# Patient Record
Sex: Female | Born: 1935
Health system: Southern US, Community
[De-identification: ages and names within clinical notes are randomized; demographics above are authoritative.]

## PROBLEM LIST (undated history)

## (undated) DIAGNOSIS — D649 Anemia, unspecified: Secondary | ICD-10-CM

## (undated) DIAGNOSIS — I509 Heart failure, unspecified: Secondary | ICD-10-CM

## (undated) DIAGNOSIS — IMO0002 Reserved for concepts with insufficient information to code with codable children: Secondary | ICD-10-CM

## (undated) DIAGNOSIS — K648 Other hemorrhoids: Secondary | ICD-10-CM

## (undated) DIAGNOSIS — M8580 Other specified disorders of bone density and structure, unspecified site: Secondary | ICD-10-CM

## (undated) DIAGNOSIS — I10 Essential (primary) hypertension: Secondary | ICD-10-CM

## (undated) DIAGNOSIS — K635 Polyp of colon: Secondary | ICD-10-CM

## (undated) DIAGNOSIS — I4901 Ventricular fibrillation: Secondary | ICD-10-CM

## (undated) DIAGNOSIS — D729 Disorder of white blood cells, unspecified: Secondary | ICD-10-CM

## (undated) DIAGNOSIS — I428 Other cardiomyopathies: Secondary | ICD-10-CM

## (undated) DIAGNOSIS — K5730 Diverticulosis of large intestine without perforation or abscess without bleeding: Secondary | ICD-10-CM

## (undated) DIAGNOSIS — Z95810 Presence of automatic (implantable) cardiac defibrillator: Secondary | ICD-10-CM

## (undated) DIAGNOSIS — K644 Residual hemorrhoidal skin tags: Secondary | ICD-10-CM

## (undated) HISTORY — DX: Essential (primary) hypertension: I10

## (undated) HISTORY — PX: CARDIAC CATHETERIZATION: SHX172

## (undated) HISTORY — DX: Other cardiomyopathies: I42.8

## (undated) HISTORY — DX: Ventricular fibrillation: I49.01

## (undated) HISTORY — DX: Residual hemorrhoidal skin tags: K64.4

## (undated) HISTORY — DX: Other hemorrhoids: K64.8

## (undated) HISTORY — DX: Anemia, unspecified: D64.9

## (undated) HISTORY — DX: Polyp of colon: K63.5

## (undated) HISTORY — DX: Reserved for concepts with insufficient information to code with codable children: IMO0002

## (undated) HISTORY — DX: Presence of automatic (implantable) cardiac defibrillator: Z95.810

## (undated) HISTORY — DX: Diverticulosis of large intestine without perforation or abscess without bleeding: K57.30

## (undated) HISTORY — PX: ABDOMINAL HYSTERECTOMY: SHX81

## (undated) HISTORY — DX: Other specified disorders of bone density and structure, unspecified site: M85.80

## (undated) HISTORY — DX: Heart failure, unspecified: I50.9

## (undated) HISTORY — DX: Disorder of white blood cells, unspecified: D72.9

## (undated) HISTORY — PX: CARDIAC DEFIBRILLATOR PLACEMENT: SHX171

---

## 1998-01-06 ENCOUNTER — Ambulatory Visit (HOSPITAL_COMMUNITY): Admission: RE | Admit: 1998-01-06 | Discharge: 1998-01-06 | Payer: Self-pay | Admitting: Cardiology

## 1998-02-10 ENCOUNTER — Ambulatory Visit (HOSPITAL_COMMUNITY): Admission: RE | Admit: 1998-02-10 | Discharge: 1998-02-10 | Payer: Self-pay | Admitting: Cardiology

## 1998-02-24 ENCOUNTER — Ambulatory Visit (HOSPITAL_COMMUNITY): Admission: RE | Admit: 1998-02-24 | Discharge: 1998-02-24 | Payer: Self-pay | Admitting: Cardiology

## 2002-07-19 ENCOUNTER — Other Ambulatory Visit: Admission: RE | Admit: 2002-07-19 | Discharge: 2002-07-19 | Payer: Self-pay | Admitting: Family Medicine

## 2002-07-19 ENCOUNTER — Encounter (INDEPENDENT_AMBULATORY_CARE_PROVIDER_SITE_OTHER): Payer: Self-pay | Admitting: Family Medicine

## 2002-07-19 LAB — CONVERTED CEMR LAB: Pap Smear: NORMAL

## 2003-03-04 DIAGNOSIS — Z8679 Personal history of other diseases of the circulatory system: Secondary | ICD-10-CM | POA: Insufficient documentation

## 2005-06-20 ENCOUNTER — Ambulatory Visit: Payer: Self-pay | Admitting: Family Medicine

## 2006-07-02 ENCOUNTER — Ambulatory Visit: Payer: Self-pay | Admitting: Family Medicine

## 2006-07-15 ENCOUNTER — Ambulatory Visit: Payer: Self-pay | Admitting: Family Medicine

## 2006-07-17 ENCOUNTER — Encounter (INDEPENDENT_AMBULATORY_CARE_PROVIDER_SITE_OTHER): Payer: Self-pay | Admitting: Family Medicine

## 2006-07-17 ENCOUNTER — Ambulatory Visit (HOSPITAL_COMMUNITY): Admission: RE | Admit: 2006-07-17 | Discharge: 2006-07-17 | Payer: Self-pay | Admitting: Family Medicine

## 2006-08-05 ENCOUNTER — Ambulatory Visit: Payer: Self-pay | Admitting: Family Medicine

## 2006-08-21 ENCOUNTER — Encounter (INDEPENDENT_AMBULATORY_CARE_PROVIDER_SITE_OTHER): Payer: Self-pay | Admitting: Family Medicine

## 2006-09-11 ENCOUNTER — Ambulatory Visit: Payer: Self-pay | Admitting: Family Medicine

## 2006-09-17 ENCOUNTER — Ambulatory Visit: Payer: Self-pay | Admitting: Family Medicine

## 2006-10-06 ENCOUNTER — Encounter (INDEPENDENT_AMBULATORY_CARE_PROVIDER_SITE_OTHER): Payer: Self-pay | Admitting: Family Medicine

## 2007-05-13 ENCOUNTER — Encounter (INDEPENDENT_AMBULATORY_CARE_PROVIDER_SITE_OTHER): Payer: Self-pay | Admitting: Family Medicine

## 2007-05-13 DIAGNOSIS — I509 Heart failure, unspecified: Secondary | ICD-10-CM | POA: Insufficient documentation

## 2007-05-13 DIAGNOSIS — M949 Disorder of cartilage, unspecified: Secondary | ICD-10-CM | POA: Insufficient documentation

## 2007-05-13 DIAGNOSIS — I10 Essential (primary) hypertension: Secondary | ICD-10-CM | POA: Insufficient documentation

## 2007-05-13 DIAGNOSIS — M899 Disorder of bone, unspecified: Secondary | ICD-10-CM | POA: Insufficient documentation

## 2007-05-13 DIAGNOSIS — I428 Other cardiomyopathies: Secondary | ICD-10-CM | POA: Insufficient documentation

## 2007-07-02 ENCOUNTER — Ambulatory Visit: Payer: Self-pay | Admitting: Family Medicine

## 2007-07-03 ENCOUNTER — Encounter (INDEPENDENT_AMBULATORY_CARE_PROVIDER_SITE_OTHER): Payer: Self-pay | Admitting: Family Medicine

## 2007-08-25 ENCOUNTER — Encounter (INDEPENDENT_AMBULATORY_CARE_PROVIDER_SITE_OTHER): Payer: Self-pay | Admitting: Family Medicine

## 2007-09-03 ENCOUNTER — Encounter: Payer: Self-pay | Admitting: Internal Medicine

## 2007-09-03 LAB — CONVERTED CEMR LAB: Pap Smear: NORMAL

## 2007-10-07 ENCOUNTER — Encounter (INDEPENDENT_AMBULATORY_CARE_PROVIDER_SITE_OTHER): Payer: Self-pay | Admitting: Family Medicine

## 2008-08-30 ENCOUNTER — Encounter (INDEPENDENT_AMBULATORY_CARE_PROVIDER_SITE_OTHER): Payer: Self-pay | Admitting: Family Medicine

## 2008-10-03 ENCOUNTER — Encounter (INDEPENDENT_AMBULATORY_CARE_PROVIDER_SITE_OTHER): Payer: Self-pay | Admitting: Family Medicine

## 2008-10-04 ENCOUNTER — Encounter (INDEPENDENT_AMBULATORY_CARE_PROVIDER_SITE_OTHER): Payer: Self-pay | Admitting: Family Medicine

## 2008-10-06 ENCOUNTER — Encounter (INDEPENDENT_AMBULATORY_CARE_PROVIDER_SITE_OTHER): Payer: Self-pay | Admitting: Family Medicine

## 2008-12-06 ENCOUNTER — Encounter: Payer: Self-pay | Admitting: Physician Assistant

## 2009-04-03 ENCOUNTER — Encounter (INDEPENDENT_AMBULATORY_CARE_PROVIDER_SITE_OTHER): Payer: Self-pay | Admitting: Internal Medicine

## 2009-08-02 DIAGNOSIS — I4901 Ventricular fibrillation: Secondary | ICD-10-CM

## 2009-08-02 HISTORY — DX: Ventricular fibrillation: I49.01

## 2009-08-27 ENCOUNTER — Inpatient Hospital Stay (HOSPITAL_COMMUNITY): Admission: EM | Admit: 2009-08-27 | Discharge: 2009-09-07 | Payer: Self-pay | Admitting: Emergency Medicine

## 2009-08-27 ENCOUNTER — Ambulatory Visit: Payer: Self-pay | Admitting: Internal Medicine

## 2009-08-27 ENCOUNTER — Ambulatory Visit: Payer: Self-pay | Admitting: Cardiology

## 2009-08-27 ENCOUNTER — Encounter: Payer: Self-pay | Admitting: Cardiology

## 2009-08-31 ENCOUNTER — Encounter: Payer: Self-pay | Admitting: Internal Medicine

## 2009-09-02 DIAGNOSIS — K5730 Diverticulosis of large intestine without perforation or abscess without bleeding: Secondary | ICD-10-CM

## 2009-09-02 DIAGNOSIS — K644 Residual hemorrhoidal skin tags: Secondary | ICD-10-CM

## 2009-09-02 DIAGNOSIS — K635 Polyp of colon: Secondary | ICD-10-CM

## 2009-09-02 HISTORY — DX: Diverticulosis of large intestine without perforation or abscess without bleeding: K57.30

## 2009-09-02 HISTORY — DX: Residual hemorrhoidal skin tags: K64.4

## 2009-09-02 HISTORY — PX: COLONOSCOPY W/ POLYPECTOMY: SHX1380

## 2009-09-02 HISTORY — DX: Polyp of colon: K63.5

## 2009-09-06 ENCOUNTER — Encounter: Payer: Self-pay | Admitting: Internal Medicine

## 2009-09-06 ENCOUNTER — Encounter: Payer: Self-pay | Admitting: Cardiology

## 2009-09-07 ENCOUNTER — Encounter: Payer: Self-pay | Admitting: Internal Medicine

## 2009-09-09 ENCOUNTER — Ambulatory Visit: Payer: Self-pay | Admitting: Cardiology

## 2009-09-11 ENCOUNTER — Encounter: Payer: Self-pay | Admitting: Internal Medicine

## 2009-09-11 ENCOUNTER — Encounter (INDEPENDENT_AMBULATORY_CARE_PROVIDER_SITE_OTHER): Payer: Self-pay | Admitting: Cardiology

## 2009-09-11 LAB — CONVERTED CEMR LAB
POC INR: 1.2
Prothrombin Time: 12.4 s

## 2009-09-13 ENCOUNTER — Encounter: Payer: Self-pay | Admitting: Internal Medicine

## 2009-09-14 ENCOUNTER — Encounter (INDEPENDENT_AMBULATORY_CARE_PROVIDER_SITE_OTHER): Payer: Self-pay | Admitting: Cardiology

## 2009-09-14 ENCOUNTER — Encounter: Payer: Self-pay | Admitting: Internal Medicine

## 2009-09-14 LAB — CONVERTED CEMR LAB
POC INR: 1.4
Prothrombin Time: 14 s

## 2009-09-19 ENCOUNTER — Encounter: Payer: Self-pay | Admitting: Cardiology

## 2009-09-19 LAB — CONVERTED CEMR LAB
POC INR: 1.7
Prothrombin Time: 17.3 s

## 2009-09-21 ENCOUNTER — Encounter: Payer: Self-pay | Admitting: Internal Medicine

## 2009-09-21 ENCOUNTER — Ambulatory Visit: Payer: Self-pay

## 2009-09-22 ENCOUNTER — Ambulatory Visit: Payer: Self-pay | Admitting: Internal Medicine

## 2009-09-22 DIAGNOSIS — I4901 Ventricular fibrillation: Secondary | ICD-10-CM | POA: Insufficient documentation

## 2009-09-25 ENCOUNTER — Encounter: Payer: Self-pay | Admitting: Internal Medicine

## 2009-09-25 ENCOUNTER — Encounter: Payer: Self-pay | Admitting: Cardiology

## 2009-09-25 LAB — CONVERTED CEMR LAB: POC INR: 2.9

## 2009-09-26 LAB — CONVERTED CEMR LAB
BUN: 8 mg/dL (ref 6–23)
CO2: 27 meq/L (ref 19–32)
Calcium: 8.9 mg/dL (ref 8.4–10.5)
Chloride: 108 meq/L (ref 96–112)
Creatinine, Ser: 0.8 mg/dL (ref 0.4–1.2)
GFR calc non Af Amer: 90.48 mL/min (ref >60)
Glucose, Bld: 107 mg/dL — ABNORMAL HIGH (ref 70–99)
Magnesium: 2 mg/dL (ref 1.5–2.5)
Potassium: 3.2 meq/L — ABNORMAL LOW (ref 3.5–5.1)
Sodium: 141 meq/L (ref 135–145)

## 2009-09-28 ENCOUNTER — Encounter: Payer: Self-pay | Admitting: Cardiology

## 2009-10-02 ENCOUNTER — Encounter: Payer: Self-pay | Admitting: Physician Assistant

## 2009-10-03 ENCOUNTER — Encounter: Payer: Self-pay | Admitting: Internal Medicine

## 2009-10-03 LAB — CONVERTED CEMR LAB
POC INR: 2.1
Prothrombin Time: 20.7 s

## 2009-10-04 ENCOUNTER — Encounter: Payer: Self-pay | Admitting: Physician Assistant

## 2009-10-11 ENCOUNTER — Encounter: Payer: Self-pay | Admitting: Cardiology

## 2009-10-11 ENCOUNTER — Encounter: Payer: Self-pay | Admitting: Internal Medicine

## 2009-10-11 LAB — CONVERTED CEMR LAB
POC INR: 2.4
Prothrombin Time: 23.9 s

## 2009-10-13 ENCOUNTER — Telehealth: Payer: Self-pay | Admitting: Physician Assistant

## 2009-10-26 ENCOUNTER — Ambulatory Visit: Payer: Self-pay | Admitting: Cardiology

## 2009-10-26 LAB — CONVERTED CEMR LAB: POC INR: 1.9

## 2009-11-06 ENCOUNTER — Encounter: Payer: Self-pay | Admitting: Physician Assistant

## 2009-11-07 ENCOUNTER — Encounter: Payer: Self-pay | Admitting: Cardiology

## 2009-11-08 ENCOUNTER — Telehealth: Payer: Self-pay | Admitting: Physician Assistant

## 2009-11-08 ENCOUNTER — Encounter: Payer: Self-pay | Admitting: Physician Assistant

## 2009-11-10 ENCOUNTER — Ambulatory Visit: Payer: Self-pay | Admitting: Internal Medicine

## 2009-11-10 DIAGNOSIS — R059 Cough, unspecified: Secondary | ICD-10-CM | POA: Insufficient documentation

## 2009-11-10 DIAGNOSIS — R05 Cough: Secondary | ICD-10-CM

## 2009-11-16 ENCOUNTER — Ambulatory Visit: Payer: Self-pay | Admitting: Internal Medicine

## 2009-11-16 LAB — CONVERTED CEMR LAB: POC INR: 2.3

## 2009-11-29 ENCOUNTER — Encounter: Payer: Self-pay | Admitting: Internal Medicine

## 2009-12-14 ENCOUNTER — Ambulatory Visit: Payer: Self-pay | Admitting: Internal Medicine

## 2009-12-14 LAB — CONVERTED CEMR LAB: POC INR: 2

## 2009-12-25 ENCOUNTER — Encounter: Payer: Self-pay | Admitting: Internal Medicine

## 2010-01-11 ENCOUNTER — Ambulatory Visit: Payer: Self-pay | Admitting: Internal Medicine

## 2010-01-11 LAB — CONVERTED CEMR LAB: POC INR: 1.1

## 2010-01-12 ENCOUNTER — Ambulatory Visit (HOSPITAL_COMMUNITY): Admission: RE | Admit: 2010-01-12 | Discharge: 2010-01-12 | Payer: Self-pay | Admitting: Gastroenterology

## 2010-01-19 ENCOUNTER — Ambulatory Visit: Payer: Self-pay | Admitting: Internal Medicine

## 2010-01-19 LAB — CONVERTED CEMR LAB: POC INR: 1.9

## 2010-02-02 ENCOUNTER — Ambulatory Visit: Payer: Self-pay | Admitting: Cardiovascular Disease

## 2010-02-02 LAB — CONVERTED CEMR LAB: POC INR: 1.9

## 2010-02-05 ENCOUNTER — Ambulatory Visit: Payer: Self-pay | Admitting: Internal Medicine

## 2010-02-16 ENCOUNTER — Ambulatory Visit: Payer: Self-pay | Admitting: Cardiology

## 2010-02-16 LAB — CONVERTED CEMR LAB: POC INR: 1.7

## 2010-03-02 ENCOUNTER — Ambulatory Visit: Payer: Self-pay | Admitting: Cardiology

## 2010-03-23 ENCOUNTER — Ambulatory Visit: Payer: Self-pay | Admitting: Cardiology

## 2010-03-23 LAB — CONVERTED CEMR LAB: POC INR: 2.2

## 2010-04-13 ENCOUNTER — Telehealth: Payer: Self-pay | Admitting: Internal Medicine

## 2010-04-20 ENCOUNTER — Ambulatory Visit: Payer: Self-pay | Admitting: Cardiology

## 2010-04-20 LAB — CONVERTED CEMR LAB: POC INR: 2.6

## 2010-05-04 ENCOUNTER — Ambulatory Visit: Payer: Self-pay | Admitting: Cardiology

## 2010-05-04 ENCOUNTER — Inpatient Hospital Stay (HOSPITAL_COMMUNITY): Admission: EM | Admit: 2010-05-04 | Discharge: 2010-05-07 | Payer: Self-pay | Admitting: Emergency Medicine

## 2010-05-08 ENCOUNTER — Telehealth: Payer: Self-pay | Admitting: Internal Medicine

## 2010-05-09 ENCOUNTER — Encounter: Payer: Self-pay | Admitting: Internal Medicine

## 2010-05-10 ENCOUNTER — Ambulatory Visit: Payer: Self-pay | Admitting: Internal Medicine

## 2010-05-10 DIAGNOSIS — I4892 Unspecified atrial flutter: Secondary | ICD-10-CM | POA: Insufficient documentation

## 2010-05-10 DIAGNOSIS — E876 Hypokalemia: Secondary | ICD-10-CM | POA: Insufficient documentation

## 2010-05-10 LAB — CONVERTED CEMR LAB
BUN: 34 mg/dL — ABNORMAL HIGH (ref 6–23)
CO2: 25 meq/L (ref 19–32)
Calcium: 9.1 mg/dL (ref 8.4–10.5)
Chloride: 102 meq/L (ref 96–112)
Creatinine, Ser: 1.2 mg/dL (ref 0.4–1.2)
GFR calc non Af Amer: 56.57 mL/min (ref >60)
Glucose, Bld: 80 mg/dL (ref 70–99)
POC INR: 2.4
Potassium: 4.1 meq/L (ref 3.5–5.1)
Sodium: 137 meq/L (ref 135–145)

## 2010-05-17 ENCOUNTER — Encounter: Payer: Self-pay | Admitting: Internal Medicine

## 2010-05-21 ENCOUNTER — Ambulatory Visit: Payer: Self-pay | Admitting: Internal Medicine

## 2010-05-21 LAB — CONVERTED CEMR LAB: POC INR: 3.9

## 2010-05-23 ENCOUNTER — Ambulatory Visit: Payer: Self-pay

## 2010-05-23 ENCOUNTER — Encounter: Payer: Self-pay | Admitting: Physician Assistant

## 2010-05-23 DIAGNOSIS — R42 Dizziness and giddiness: Secondary | ICD-10-CM | POA: Insufficient documentation

## 2010-05-30 ENCOUNTER — Encounter: Payer: Self-pay | Admitting: Internal Medicine

## 2010-06-01 ENCOUNTER — Ambulatory Visit: Payer: Self-pay | Admitting: Cardiology

## 2010-06-01 ENCOUNTER — Ambulatory Visit: Payer: Self-pay | Admitting: Internal Medicine

## 2010-06-01 LAB — CONVERTED CEMR LAB
ALT: 48 units/L — ABNORMAL HIGH (ref 0–35)
AST: 32 units/L (ref 0–37)
Albumin: 3.4 g/dL — ABNORMAL LOW (ref 3.5–5.2)
Alkaline Phosphatase: 101 units/L (ref 39–117)
BUN: 28 mg/dL — ABNORMAL HIGH (ref 6–23)
Bilirubin, Direct: 0 mg/dL (ref 0.0–0.3)
CO2: 27 meq/L (ref 19–32)
Calcium: 8.8 mg/dL (ref 8.4–10.5)
Chloride: 104 meq/L (ref 96–112)
Creatinine, Ser: 1.5 mg/dL — ABNORMAL HIGH (ref 0.4–1.2)
Free T4: 1.11 ng/dL (ref 0.60–1.60)
GFR calc non Af Amer: 43.72 mL/min (ref >60)
Glucose, Bld: 80 mg/dL (ref 70–99)
POC INR: 3.3
Potassium: 4.7 meq/L (ref 3.5–5.1)
Sodium: 137 meq/L (ref 135–145)
TSH: 2.25 microintl units/mL (ref 0.35–5.50)
Total Bilirubin: 0.4 mg/dL (ref 0.3–1.2)
Total Protein: 7.1 g/dL (ref 6.0–8.3)

## 2010-06-15 ENCOUNTER — Ambulatory Visit: Payer: Self-pay | Admitting: Cardiology

## 2010-06-15 LAB — CONVERTED CEMR LAB: POC INR: 2

## 2010-07-06 ENCOUNTER — Ambulatory Visit: Payer: Self-pay | Admitting: Cardiology

## 2010-07-06 LAB — CONVERTED CEMR LAB: POC INR: 2.3

## 2010-08-03 ENCOUNTER — Ambulatory Visit: Payer: Self-pay | Admitting: Cardiology

## 2010-08-16 ENCOUNTER — Ambulatory Visit: Payer: Self-pay | Admitting: Internal Medicine

## 2010-08-17 ENCOUNTER — Ambulatory Visit: Payer: Self-pay | Admitting: Internal Medicine

## 2010-08-17 LAB — CONVERTED CEMR LAB: POC INR: 2.2

## 2010-09-07 ENCOUNTER — Ambulatory Visit: Admission: RE | Admit: 2010-09-07 | Discharge: 2010-09-07 | Payer: Self-pay | Source: Home / Self Care

## 2010-09-07 LAB — CONVERTED CEMR LAB: POC INR: 3

## 2010-09-12 ENCOUNTER — Encounter (INDEPENDENT_AMBULATORY_CARE_PROVIDER_SITE_OTHER): Payer: Self-pay | Admitting: *Deleted

## 2010-09-17 ENCOUNTER — Telehealth: Payer: Self-pay | Admitting: Internal Medicine

## 2010-09-30 LAB — CONVERTED CEMR LAB
BUN: 11 mg/dL (ref 6–23)
Basophils Absolute: 0 10*3/uL (ref 0.0–0.1)
Basophils Relative: 0.7 % (ref 0.0–3.0)
CO2: 27 meq/L (ref 19–32)
Calcium: 8.9 mg/dL (ref 8.4–10.5)
Chloride: 113 meq/L — ABNORMAL HIGH (ref 96–112)
Creatinine, Ser: 0.7 mg/dL (ref 0.4–1.2)
Eosinophils Absolute: 0.2 10*3/uL (ref 0.0–0.7)
Eosinophils Relative: 2.8 % (ref 0.0–5.0)
GFR calc non Af Amer: 105.51 mL/min (ref >60)
Glucose, Bld: 99 mg/dL (ref 70–99)
HCT: 37.3 % (ref 36.0–46.0)
Hemoglobin: 12.1 g/dL (ref 12.0–15.0)
Lymphocytes Relative: 33.5 % (ref 12.0–46.0)
Lymphs Abs: 2.4 10*3/uL (ref 0.7–4.0)
MCHC: 32.4 g/dL (ref 30.0–36.0)
MCV: 88.4 fL (ref 78.0–100.0)
Magnesium: 2.1 mg/dL (ref 1.5–2.5)
Monocytes Absolute: 0.4 10*3/uL (ref 0.1–1.0)
Monocytes Relative: 5.9 % (ref 3.0–12.0)
Neutro Abs: 4.1 10*3/uL (ref 1.4–7.7)
Neutrophils Relative %: 57.1 % (ref 43.0–77.0)
Platelets: 204 10*3/uL (ref 150.0–400.0)
Potassium: 3.8 meq/L (ref 3.5–5.1)
Pro B Natriuretic peptide (BNP): 1511 pg/mL — ABNORMAL HIGH (ref 0.0–100.0)
RBC: 4.22 M/uL (ref 3.87–5.11)
RDW: 18.6 % — ABNORMAL HIGH (ref 11.5–14.6)
Sodium: 142 meq/L (ref 135–145)
WBC: 7.1 10*3/uL (ref 4.5–10.5)

## 2010-10-02 NOTE — Assessment & Plan Note (Signed)
Summary: PER CHECK OUT/SF   Visit Type:  Follow-up Referring Provider:  Linde Gillis, MD  CC:  pt reports SOB.  This has been going on since January. Pt also having stomach discomfort.  History of Present Illness: The patient presents today for routine electrophysiology followup. She reports doing reasonably well since last being seen in our clinic.  She reports stable SOB.  She also has a nonproductive cough at night.  She denies any further ICD shocks. The patient denies symptoms of palpitations, chest pain, orthopnea, PND, lower extremity edema, dizziness, presyncope, syncope, or neurologic sequela. The patient is tolerating medications without difficulties and is otherwise without complaint today.   Current Medications (verified): 1)  Aldactone 25 Mg  Tabs (Spironolactone) .... Once Daily(Cards) 2)  Coreg 25 Mg  Tabs (Carvedilol) .... Two Times A Day(Cards) 3)  Adult Aspirin Ec Low Strength 81 Mg  Tbec (Aspirin) .... Once Daily 4)  Coumadin .... Managed By Cardiologist 5)  Vitamin D3 1000 Unit Tabs (Cholecalciferol) .... One By Mouth Daily 6)  Furosemide 40 Mg Tabs (Furosemide) .... Take 1 1/2 Tablet in The Morning and 1 Tablet in The Evening 7)  Potassium Chloride Crys Cr 20 Meq Cr-Tabs (Potassium Chloride Crys Cr) .... Take One Tablet By Mouth Daily 8)  Zestril 20 Mg Tabs (Lisinopril) .... Take One Tablet By Mouth Once Daily.  Allergies (verified): No Known Drug Allergies  Past History:  Past Medical History: VENTRICULAR FIBRILLATION  ARREST 12/10 with successful resucitation s/p ICD implant 09/05/09 ATRIAL FIBRILLATION, HX OF (ICD-V12.59) OSTEOPENIA (ICD-733.90) HYPERTENSION, MODERATE (ICD-401.9) CARDIOMYOPATHY, PRIMARY NEC (ICD-425.4) CHF (ICD-428.0)    Past Surgical History: Hysterectomy Cardiac cath - Holter: Up to 7 beats, unsustained vtech.   March 28, 2004 [facility] Cardiologist: Rec ICD Implant but Pt declined. November 2007 - DEXA: Osteopenia colonoscopy 12/07 mild  left diverticulosis.Recheck 2017   ICD implantation with defibrillation threshold testing. 09/05/09  Social History: Reviewed history from 09/22/2009 and no changes required.  She lives here in [place] with her boyfriend.  They   have three adult children.  She has a remote history of tobacco abuse.   EtOH as stated above.  Denies any illicit substance use according to family.   Review of Systems       All systems are reviewed and negative except as listed in the HPI.   Vital Signs:  Patient profile:   76 year old female Height:      60 inches Weight:      164 pounds BMI:     32.14 Pulse rate:   82 / minute BP sitting:   98 / 60  (left arm) Cuff size:   large  Vitals Entered By: Judithe Modest CMA (November 10, 2009 10:31 AM)  Physical Exam  General:  Well developed, well nourished, in no acute distress. Head:  normocephalic and atraumatic Eyes:  PERRLA/EOM intact; conjunctiva and lids normal. Mouth:  Teeth, gums and palate normal. Oral mucosa normal. Neck:  Neck supple, no JVD. No masses, thyromegaly or abnormal cervical nodes. Chest Wall:  ICD pocket is well healed Lungs:  Clear bilaterally to auscultation and percussion. Heart:  RRR, 2/6 SEM LLSB Abdomen:  Bowel sounds positive; abdomen soft and non-tender without masses, organomegaly, or hernias noted. No hepatosplenomegaly. Msk:  Back normal, normal gait. Muscle strength and tone normal. Pulses:  pulses normal in all 4 extremities Extremities:  No clubbing or cyanosis. Neurologic:  Alert and oriented x 3. Skin:  Intact without lesions or rashes. Cervical  Nodes:  no significant adenopathy Psych:  Normal affect.   EKG  Procedure date:  11/10/2009  Findings:      sus 82 bpm, PVCs, LAD,  QTc 462   ICD Specifications Following MD:  Hillis Range, MD     ICD Vendor:  [employer]     ICD Model Number:  [id number]     ICD Serial Number:  [id number] ICD DOI:  09/05/2009     ICD Implanting MD:  Hillis Range, MD  Lead 1:     Location: [facility]     DOI: 09/05/2009     Model #: [id number]     Serial #: [id number]     Status: active Lead 2:    Location: RV     DOI: 09/05/2009     Model #: [id number]     Serial #: [id number]     Status: active  Indications::  Sudden cardiac death   ICD Follow Up Remote Check?  No Charge Time:  8.4 seconds     Battery Est. Longevity:  7.4 years   ICD Device Measurements Atrium:  Amplitude: 2.4 mV, Impedance: 400 ohms, Threshold: 1.0 V at 0.5 msec Right Ventricle:  Amplitude: 11.2 mV, Impedance: 450 ohms, Threshold: 0.75 V at 0.5 msec Shock Impedance: 41 ohms   Episodes MS Episodes:  6     Percent Mode Switch:  <1%     Coumadin:  Yes Shock:  0     ATP:  0     Nonsustained:  0     Atrial Pacing:  2.6%     Ventricular Pacing:  <1%  Brady Parameters Mode DDD     Lower Rate Limit:  60     Upper Rate Limit 120 PAV 200     Sensed AV Delay:  200  Tachy Zones VF:  200     VT:  171     Next Cardiology Appt Due:  01/31/2010 Tech Comments:  Outputs reprogrammed for chronic thresholds.  6 mode switch episodes the longest 8 seconds, + coumadin.  There were 8 PMT episodes, PVARP reprogrammed .  ROV 3 months with Dr. Johney Frame. Altha Harm, LPN  November 10, 2009 11:49 AM  MD Comments:  agree with above changes made for PMT. nonsustained VF observed  Impression & Recommendations:  Problem # 1:  VENTRICULAR FIBRILLATION (ICD-427.41) The patient is s/p ICD implant for secondary prevention of sudden death.  She appears to be doing well without further ICD shocks.  She has had one episode of VT in the VF zone which spontaneously terminated.  Her ICD is functioning appropriately today.  She continues to follow at [facility] where she is being considered for cardiac transplantation.  We will increase coreg today. We will check electrolytes today  Her updated medication list for this problem includes:    Coreg 25 Mg Tabs (Carvedilol) .[name]... Take 1 1/2 tablets two times a day    Adult Aspirin Ec  Low Strength 81 Mg Tbec (Aspirin) ..... Once daily    Zestril 20 Mg Tabs (Lisinopril) .[name]... Take one tablet by mouth once daily.  Problem # 2:  CARDIOMYOPATHY, PRIMARY NEC (ICD-425.4)  stable contiinue medical therapy with increased coreg as above check BNP, BMET, and magnesium today  Her updated medication list for this problem includes:    Aldactone 25 Mg Tabs (Spironolactone) ..... Once daily(cards)    Coreg 25 Mg Tabs (Carvedilol) .[name]... Take 1 1/2 tablets two times a day    Adult Aspirin  Ec Low Strength 81 Mg Tbec (Aspirin) ..... Once daily    Furosemide 40 Mg Tabs (Furosemide) .[name]... Take 1 1/2 tablet in the morning and 1 tablet in the evening    Zestril 20 Mg Tabs (Lisinopril) .[name]... Take one tablet by mouth once daily.  Orders: TLB-BMP (Basic Metabolic Panel-BMET) (80048-METABOL) TLB-BNP (B-Natriuretic Peptide) (83880-BNPR) TLB-CBC Platelet - w/Differential (85025-CBCD) TLB-Magnesium (Mg) (83735-MG)  Problem # 3:  HYPERTENSION, MODERATE (ICD-401.9)  stable  Her updated medication list for this problem includes:    Aldactone 25 Mg Tabs (Spironolactone) ..... Once daily(cards)    Coreg 25 Mg Tabs (Carvedilol) .[name]... Take 1 1/2 tablets two times a day    Adult Aspirin Ec Low Strength 81 Mg Tbec (Aspirin) ..... Once daily    Furosemide 40 Mg Tabs (Furosemide) .[name]... Take 1 1/2 tablet in the morning and 1 tablet in the evening    Zestril 20 Mg Tabs (Lisinopril) .[name]... Take one tablet by mouth once daily.  Her updated medication list for this problem includes:    Aldactone 25 Mg Tabs (Spironolactone) ..... Once daily(cards)    Coreg 25 Mg Tabs (Carvedilol) .[name]..[name] Two times a day(cards)    Adult Aspirin Ec Low Strength 81 Mg Tbec (Aspirin) ..... Once daily    Furosemide 40 Mg Tabs (Furosemide) .[name]... Take 1 1/2 tablet in the morning and 1 tablet in the evening    Zestril 20 Mg Tabs (Lisinopril) .[name]... Take one tablet by mouth once daily.  Orders: TLB-BMP (Basic Metabolic Panel-BMET)  (80048-METABOL) TLB-BNP (B-Natriuretic Peptide) (83880-BNPR) TLB-CBC Platelet - w/Differential (85025-CBCD) TLB-Magnesium (Mg) (83735-MG)  Problem # 4:  ATRIAL FIBRILLATION, HX OF (ICD-V12.59) Assessment: Unchanged  continue coumadin for stroke prevention  check cbc as she is on coumadin  Orders: TLB-BMP (Basic Metabolic Panel-BMET) (80048-METABOL) TLB-BNP (B-Natriuretic Peptide) (83880-BNPR) TLB-CBC Platelet - w/Differential (85025-CBCD) TLB-Magnesium (Mg) (83735-MG)  Problem # 5:  COUGH (ICD-786.2) She has a nocturnal cough.  This may be due to reflux.  We will try PPI.  If not improved, will consider other causes.  Other Orders: EKG w/ Interpretation (93000)  Patient Instructions: 1)  Your physician recommends that you schedule a follow-up appointment in: 3 months with Dr Johney Frame 2)  Your physician recommends that you return for lab work today 3)  Your physician has recommended you make the following change in your medication: start Protonix 40mg  daily and increase Coreg to 37.5mg  two times a day Prescriptions: FUROSEMIDE 40 MG TABS (FUROSEMIDE) take 1 1/2 tablet in the morning and 1 tablet in the evening  #75 x 11   Entered by:   Dennis Bast, RN, BSN   Authorized by:   Hillis Range, MD   Signed by:   Dennis Bast, RN, BSN on 11/10/2009   Method used:   Electronically to        [facility] [address] [id number] (retail)       [address]       [place], [place]  [address]       Ph: [phone]       Fax: [phone]   RxID:   [id number] COREG 25 MG  TABS (CARVEDILOL) take 1 1/2 tablets two times a day  #90 x 11   Entered by:   Dennis Bast, RN, BSN   Authorized by:   Hillis Range, MD   Signed by:   Dennis Bast, RN, BSN on 11/10/2009   Method used:   Electronically to  [facility] [facility] [address] [id number] (retail)       [address]       [place], [place]  [address]       Ph: [phone]       Fax: [phone]   RxID:    [id number] COREG 25 MG  TABS (CARVEDILOL) take 1 1/2 tablets two times a day  #75 x 11   Entered by:   Dennis Bast, RN, BSN   Authorized by:   Hillis Range, MD   Signed by:   Dennis Bast, RN, BSN on 11/10/2009   Method used:   Electronically to        [facility] [address] [facility]       [address]       [place], [place]  [address]       Ph: [phone]       Fax: [phone]   RxID:   [id number] PROTONIX 40 MG TBEC (PANTOPRAZOLE SODIUM) one by mouth daily  #30 x 11   Entered by:   Dennis Bast, RN, BSN   Authorized by:   Hillis Range, MD   Signed by:   Dennis Bast, RN, BSN on 11/10/2009   Method used:   Electronically to        [facility] [address] [facility]       [address]       [place], [place]  [address]       Ph: [phone]       Fax: [phone]   RxID:   [id number]

## 2010-10-02 NOTE — Medication Information (Signed)
Summary: rov/sp  Anticoagulant Therapy  Managed by: Cloyde Reams, RN, BSN Referring MD: Johney Frame MD, Shann Medal MD: Tenny Craw MD, Gunnar Fusi Indication 1: Atrial Fibrillation Lab Used: [facility] [place] Site: [address] INR RANGE 2.0-3.0  Dietary changes: no    Health status changes: no    Bleeding/hemorrhagic complications: yes       Details: Hematoma on Back of L leg, size of quarter, no redness, swelling or pain assoc with.  Pt will continue to monitor and call if increases in size.  Recent/future hospitalizations: no    Any changes in medication regimen? no    Recent/future dental: no  Any missed doses?: no       Is patient compliant with meds? yes       Allergies: No Known Drug Allergies  Anticoagulation Management History:      The patient is taking warfarin and comes in today for a routine follow up visit.  Positive risk factors for bleeding include an age of 74 years or older.  The bleeding index is 'intermediate risk'.  Positive CHADS2 values include History of CHF and History of HTN.  Negative CHADS2 values include Age > 74 years old.  Anticoagulation responsible provider: Tenny Craw MD, Gunnar Fusi.  Exp: 04/2011.    Anticoagulation Management Assessment/Plan:      The target INR is 2.0-3.0.  The next INR is due 03/23/2010.  Anticoagulation instructions were given to patient.  Results were reviewed/authorized by Cloyde Reams, RN, BSN.  She was notified by Cloyde Reams RN.         Prior Anticoagulation Instructions: INR 1.7  Take 2 1/2 tablets today then increase dose to 2 tablets every day except 1 tablet on Sunday and Thursday.   Current Anticoagulation Instructions: INR 3.1  Take 1 tablet today, then start taking 2 tablets daily except 1 tablet on Sundays, Tuesdays, and Thursdays.  Recheck in 3 weeks.

## 2010-10-02 NOTE — Letter (Signed)
Summary: CARDIOLOGY  CARDIOLOGY   Imported By: Arta Bruce 04/24/2010 12:52:56  _____________________________________________________________________  External Attachment:    Type:   Image     Comment:   External Document

## 2010-10-02 NOTE — Letter (Signed)
Summary: [facility]   Imported By: Arta Bruce 09/24/2007 15:55:18  _____________________________________________________________________  External Attachment:    Type:   Image     Comment:   External Document

## 2010-10-02 NOTE — Medication Information (Signed)
Summary: rov/mw  Anticoagulant Therapy  Managed by: Weston Brass, PharmD Referring MD: Johney Frame MD, Shann Medal MD: Tenny Craw MD, Gunnar Fusi Indication 1: Atrial Fibrillation Lab Used: [facility] [place] Site: [address] INR POC 3.9 INR RANGE 2.0-3.0  Dietary changes: no    Health status changes: no    Bleeding/hemorrhagic complications: no    Recent/future hospitalizations: yes       Details: In hospital over labor day weekend  Any changes in medication regimen? yes       Details: started on amiodarone while in hospital  Recent/future dental: no  Any missed doses?: no       Is patient compliant with meds? yes       Allergies: No Known Drug Allergies  Anticoagulation Management History:      The patient is taking warfarin and comes in today for a routine follow up visit.  Positive risk factors for bleeding include an age of 74 years or older.  The bleeding index is 'intermediate risk'.  Positive CHADS2 values include History of CHF and History of HTN.  Negative CHADS2 values include Age > 46 years old.  Anticoagulation responsible provider: Tenny Craw MD, Gunnar Fusi.  INR POC: 3.9.  Cuvette Lot#: [id number].  Exp: 06/2011.    Anticoagulation Management Assessment/Plan:      The target INR is 2.0-3.0.  The next INR is due 06/01/2010.  Anticoagulation instructions were given to patient.  Results were reviewed/authorized by Weston Brass, PharmD.  She was notified by Kennieth Francois.         Prior Anticoagulation Instructions: INR 2.4 Continue taking 1 tablet on sunday, tuesday, and thursday. Take your saturday tablet down to 1 tablet instead of 2. keep monday, wednesday, and friday at 2 tablets. Because you started amiodarone, I need to see you sooner.   Current Anticoagulation Instructions: INR 3.9  Skip today's dose.  Your Coumadin dose has been changed to one tablet every day except for two tablets on Friday.  We will see you on 9/30.

## 2010-10-02 NOTE — Progress Notes (Signed)
  Phone Note Outgoing Call   Summary of Call: I believe she showed up for appt but realized that she has a PCP elsewhere. Not documented in system. Please confirm. Initial call taken by: Tereso Newcomer PA-C,  November 08, 2009 8:14 AM  Follow-up for Phone Call        Left message on answering machine for pt to call back....Marland KitchenMarland KitchenArmenia Shannon  November 08, 2009 11:39 AM   yes she is going somewhere else as soon as she finds a primary.. Follow-up by: [place] Shannon,  November 08, 2009 12:27 PM

## 2010-10-02 NOTE — Letter (Signed)
Summary: [facility]   Imported By: Arta Bruce 09/24/2007 15:53:40  _____________________________________________________________________  External Attachment:    Type:   Image     Comment:   External Document

## 2010-10-02 NOTE — Letter (Signed)
Summary: [facility]   Imported By: Marylou Mccoy 07/24/2010 15:39:54  _____________________________________________________________________  External Attachment:    Type:   Image     Comment:   External Document

## 2010-10-02 NOTE — Medication Information (Signed)
Summary: rov/jm  Anticoagulant Therapy  Managed by: Weston Brass, PharmD Referring MD: Johney Frame MD, Shann Medal MD: Juanda Chance MD, Darby Shadwick Indication 1: Atrial Fibrillation Lab Used: [facility] [place] Site: [address] INR POC 3.3 INR RANGE 2.0-3.0  Dietary changes: no    Health status changes: no    Bleeding/hemorrhagic complications: no    Recent/future hospitalizations: no    Any changes in medication regimen? yes       Details: amiodarone was started first on September   Recent/future dental: no  Any missed doses?: no       Is patient compliant with meds? yes      Comments: Pt reports taking 1 tablet every day except 2 tablets on Monday Wednesday and Friday since last visit.  She did not make the change from before.   Allergies: No Known Drug Allergies  Anticoagulation Management History:      The patient is taking warfarin and comes in today for a routine follow up visit.  Positive risk factors for bleeding include an age of 74 years or older.  The bleeding index is 'intermediate risk'.  Positive CHADS2 values include History of CHF and History of HTN.  Negative CHADS2 values include Age > 1 years old.  Anticoagulation responsible provider: Juanda Chance MD, Smitty Cords.  INR POC: 3.3.  Cuvette Lot#: [id number].  Exp: 07/2011.    Anticoagulation Management Assessment/Plan:      The target INR is 2.0-3.0.  The next INR is due 06/15/2010.  Anticoagulation instructions were given to patient.  Results were reviewed/authorized by Weston Brass, PharmD.  She was notified by Weston Brass PharmD.         Prior Anticoagulation Instructions: INR 3.9  Skip today's dose.  Your Coumadin dose has been changed to one tablet every day except for two tablets on Friday.  We will see you on 9/30.   Current Anticoagulation Instructions: INR 3.3  Take 1 tablet today then decrease dose to 1 tablet every day except 2 tablets on Friday.  Recheck INR in 2 weeks.

## 2010-10-02 NOTE — Medication Information (Signed)
Summary: rov/sp  Anticoagulant Therapy  Managed by: Reina Fuse, PharmD Referring MD: Johney Frame MD, Fayrene Fearing PCP: None Supervising MD: Myrtis Ser MD, Tinnie Gens Indication 1: Atrial Fibrillation Lab Used: [facility] [place] Site: [address] INR POC 2.0 INR RANGE 2.0-3.0  Dietary changes: no    Health status changes: no    Bleeding/hemorrhagic complications: no    Recent/future hospitalizations: no    Any changes in medication regimen? no    Recent/future dental: no  Any missed doses?: no       Is patient compliant with meds? yes       Current Medications (verified): 1)  Spironolactone 50 Mg Tabs (Spironolactone) .... Take One Tablet Once Daily 2)  Coreg 25 Mg  Tabs (Carvedilol) .... Take One Tablet Two Times A Day 3)  Adult Aspirin Ec Low Strength 81 Mg  Tbec (Aspirin) .... Once Daily 4)  Coumadin .... Managed By Cardiologist 5)  Vitamin D3 1000 Unit Tabs (Cholecalciferol) .... One By Mouth Daily 6)  Furosemide 40 Mg Tabs (Furosemide) .... Take 2 Tablets in The Morning and 1 Tablet in The Evening 7)  Potassium Chloride Crys Cr 20 Meq Cr-Tabs (Potassium Chloride Crys Cr) .... Take One Tablet By Mouth Daily 8)  Zestril 20 Mg Tabs (Lisinopril) .... Take One Tablet By Mouth Once Daily. 9)  Amiodarone Hcl 200 Mg Tabs (Amiodarone Hcl) .... Take One Tablet By Mouth Daily  Allergies (verified): No Known Drug Allergies  Anticoagulation Management History:      The patient is taking warfarin and comes in today for a routine follow up visit.  Positive risk factors for bleeding include an age of 72 years or older.  The bleeding index is 'intermediate risk'.  Positive CHADS2 values include History of CHF and History of HTN.  Negative CHADS2 values include Age > 44 years old.  Anticoagulation responsible provider: Myrtis Ser MD, Tinnie Gens.  INR POC: 2.0.  Cuvette Lot#: [id number].  Exp: 07/2011.    Anticoagulation Management Assessment/Plan:      The target INR is 2.0-3.0.  The next INR is  due 07/06/2010.  Anticoagulation instructions were given to patient.  Results were reviewed/authorized by Reina Fuse, PharmD.  She was notified by Reina Fuse PharmD.         Prior Anticoagulation Instructions: INR 3.3  Take 1 tablet today then decrease dose to 1 tablet every day except 2 tablets on Friday.  Recheck INR in 2 weeks.   Current Anticoagulation Instructions: INR 2.0  Continue taking Coumadin 2.5 mg (1 tab) on all days except Coumadin 5 mg (2 tabs) on Fridays.  Return to clinic in 3 weeks.

## 2010-10-02 NOTE — Procedures (Signed)
Summary: icd discharge [facility]   Current Medications (verified): 1)  Digoxin 0.125 Mg  Tabs (Digoxin) .... Once Daily(Cards) 2)  Lasix 80 Mg  Tabs (Furosemide) .... Two Times A Day(Cards) 3)  Cozaar 50 Mg  Tabs (Losartan Potassium) .... Once Daily(Cards) 4)  Aldactone 25 Mg  Tabs (Spironolactone) .... Once Daily(Cards) 5)  Coreg 25 Mg  Tabs (Carvedilol) .... Two Times A Day(Cards) 6)  Adult Aspirin Ec Low Strength 81 Mg  Tbec (Aspirin) .... Once Daily 7)  Coumadin .... Managed By Cardiologist 8)  Amiodarone Hcl 200 Mg  Tabs (Amiodarone Hcl) .... Once Daily(Per Cards) 9)  Klor-Con M20 20 Meq  Tbcr (Potassium Chloride Crys Cr) .... Two Times A Day(Per Cards) 10)  Fosamax 70 Mg  Tabs (Alendronate Sodium) .... One Every Week 11)  Furosemide 40 Mg Tabs (Furosemide) .... Take One Tablet By Mouth Daily. 12)  Potassium Chloride Crys Cr 20 Meq Cr-Tabs (Potassium Chloride Crys Cr) .... Take One Tablet By Mouth Daily 13)  Protonix 40 Mg Tbec (Pantoprazole Sodium) .... Take One Tablet By Mouth Once Daily. 14)  Zestril 20 Mg Tabs (Lisinopril) .... Take One Tablet By Mouth Once Daily.  Allergies: No Known Drug Allergies  Visit Type:  Follow-up Referring Provider:  Linde Gillis, MD   History of Present Illness: The patient presents today for acute electrophysiology followup after having an ICD shock this am.  She reports waking in usual state of health.  She was sitting in the house when she had dizziness and then an ICD shock.  She has felt "tired" since.  She denies symptoms of palpitations, chest pain, shortness of breath, orthopnea, PND, lower extremity edema, presyncope, syncope, or neurologic sequela. The patient is tolerating medications without difficulties and is otherwise without complaint today.  She was seen in our wound clinic yesterday at which time no device changes were made.   Past History:  Past Medical History: ATRIAL FIBRILLATION, HX OF (ICD-V12.59) OSTEOPENIA  (ICD-733.90) HYPERTENSION, MODERATE (ICD-401.9) CARDIOMYOPATHY, PRIMARY NEC (ICD-425.4) CHF (ICD-428.0) VF  Past Surgical History: Reviewed history from 05/13/2007 and no changes required. Hysterectomy Cardiac cath - Holter: Up to 7 beats, unsustained vtech.   March 28, [facility] Cardiologist: Rec ICD Implant but Pt declined. November 2007 - DEXA: Osteopenia colonoscopy 12/07 mild left diverticulosis.Recheck 2017   Social History:  She lives here in [place] with her boyfriend.  They   have three adult children.  She has a remote history of tobacco abuse.   EtOH as stated above.  Denies any illicit substance use according to family.    Review of Systems       All systems are reviewed and negative except as listed in the HPI.    Vital Signs:  Patient profile:   76 year old female Height:      60 inches Weight:      167 pounds BMI:     32.73 Pulse rate:   81 / minute Pulse rhythm:   regular BP sitting:   118 / 83  (left arm)  Vitals Entered By: Laurance Flatten CMA (September 25, 2009 9:38 AM)   Physical Exam  General:  Well developed, well nourished, in no acute distress. Head:  normocephalic and atraumatic Eyes:  PERRLA/EOM intact; conjunctiva and lids normal. Mouth:  Teeth, gums and palate normal. Oral mucosa normal. Neck:  Neck supple, no JVD. No masses, thyromegaly or abnormal cervical nodes. Chest Wall:  well healed ICD pocket Lungs:  Clear bilaterally to auscultation and  percussion. Heart:  RRR, 2/6 SEM LLSB Abdomen:  Bowel sounds positive; abdomen soft and non-tender without masses, organomegaly, or hernias noted. No hepatosplenomegaly. Msk:  Back normal, normal gait. Muscle strength and tone normal. Pulses:  pulses normal in all 4 extremities Extremities:  No clubbing or cyanosis. Neurologic:  Alert and oriented x 3. Skin:  Intact without lesions or rashes. Cervical Nodes:  no significant adenopathy Psych:  Normal affect.   Impression &  Recommendations:  Problem # 1:  VENTRICULAR FIBRILLATION (ICD-427.41) Pt with recurrent VF successfully defibrillated this AM by ICD.  Feels well now. No ischemic symptoms. Will check electrolytes. Increase coreg. Close follow-up.  Her updated medication list for this problem includes:    Coreg 25 Mg Tabs (Carvedilol) .[name]..[name] Two times a day(cards)    Adult Aspirin Ec Low Strength 81 Mg Tbec (Aspirin) ..... Once daily    Amiodarone Hcl 200 Mg Tabs (Amiodarone hcl) ..... Once daily(per cards)    Zestril 20 Mg Tabs (Lisinopril) .[name]... Take one tablet by mouth once daily.  Orders: TLB-BMP (Basic Metabolic Panel-BMET) (80048-METABOL) TLB-Magnesium (Mg) (83735-MG)  Problem # 2:  ATRIAL FIBRILLATION, HX OF (ICD-V12.59) stable on coumadin increase coreg  Her updated medication list for this problem includes:    Coreg 25 Mg Tabs (Carvedilol) .[name]..[name] Two times a day(cards)    Adult Aspirin Ec Low Strength 81 Mg Tbec (Aspirin) ..... Once daily    Amiodarone Hcl 200 Mg Tabs (Amiodarone hcl) ..... Once daily(per cards)    Zestril 20 Mg Tabs (Lisinopril) .[name]... Take one tablet by mouth once daily.  Problem # 3:  CARDIOMYOPATHY, PRIMARY NEC (ICD-425.4) stable CHF increase coreg off digoxin due to VF  The following medications were removed from the medication list:    Lasix 80 Mg Tabs (Furosemide) ..... One by mouth iin the am and half in the pm Her updated medication list for this problem includes:    Digoxin 0.125 Mg Tabs (Digoxin) ..... Once daily(cards)    Lasix 80 Mg Tabs (Furosemide) .[name]..[name] Two times a day(cards)    Cozaar 50 Mg Tabs (Losartan potassium) ..... Once daily(cards)    Aldactone 25 Mg Tabs (Spironolactone) ..... Once daily(cards)    Coreg 25 Mg Tabs (Carvedilol) .[name]..[name] Two times a day(cards)    Adult Aspirin Ec Low Strength 81 Mg Tbec (Aspirin) ..... Once daily    Amiodarone Hcl 200 Mg Tabs (Amiodarone hcl) ..... Once daily(per cards)    Furosemide 40 Mg Tabs (Furosemide) .[name]...  Take one tablet by mouth daily.    Zestril 20 Mg Tabs (Lisinopril) .[name]... Take one tablet by mouth once daily.    ICD Specifications Following MD:  Hillis Range, MD     ICD Vendor:  [facility] Jude     ICD Model Number:  [phone]     ICD Serial Number:  [id number] ICD DOI:  09/05/2009     ICD Implanting MD:  Hillis Range, MD  Lead 1:    Location: RA     DOI: 09/05/2009     Model #: 1888TC     Serial #: [id number]     Status: active Lead 2:    Location: RV     DOI: 09/05/2009     Model #: [id number]     Serial #: [id number]     Status: active  Indications::  Sudden cardiac death   ICD Follow Up Remote Check?  No Charge Time:  8.4 seconds     Battery Est. Longevity:  5.0 years   ICD  Device Measurements Atrium:  Impedance: 360 ohms,  Right Ventricle:  Impedance: 450 ohms,  Shock Impedance: 35 ohms   Episodes Shock:  1      Brady Parameters Mode DDD     Lower Rate Limit:  60     Upper Rate Limit 120 PAV 200     Sensed AV Delay:  200  Tachy Zones VF:  200     VT:  171     Tech Comments:  The patient was seen today for VF episode this am while getting out of bed.   MD Comments:  ICD shock for VF (appropriate)   Patient Instructions: 1)  Your physician recommends that you schedule a follow-up appointment in: 4 weeks with Dr Johney Frame 2)  Your physician has recommended you make the following change in your medication: Increase your CArvedilol to 25mg  bid 3)  Your physician recommends that you return for lab today BMP and Mag

## 2010-10-02 NOTE — Letter (Signed)
Summary: INFLUENZA VACCINATION  INFLUENZA VACCINATION   Imported By: Arta Bruce 07/07/2007 16:13:50  _____________________________________________________________________  External Attachment:    Type:   Image     Comment:   External Document

## 2010-10-02 NOTE — Medication Information (Signed)
Summary: rov/tm  Anticoagulant Therapy  Managed by: Eda Keys, PharmD Referring MD: Johney Frame MD, Shann Medal MD: Tenny Craw MD, Gunnar Fusi Indication 1: Atrial Fibrillation Lab Used: [facility] [place] Site: [address] INR POC 1.9 INR RANGE 2.0-3.0  Dietary changes: no    Health status changes: no    Bleeding/hemorrhagic complications: no    Recent/future hospitalizations: no    Any changes in medication regimen? no    Recent/future dental: no  Any missed doses?: no       Is patient compliant with meds? yes       Allergies: No Known Drug Allergies  Anticoagulation Management History:      The patient is taking warfarin and comes in today for a routine follow up visit.  Positive risk factors for bleeding include an age of 74 years or older.  The bleeding index is 'intermediate risk'.  Positive CHADS2 values include History of CHF and History of HTN.  Negative CHADS2 values include Age > 42 years old.  Anticoagulation responsible provider: Tenny Craw MD, Gunnar Fusi.  INR POC: 1.9.  Cuvette Lot#: [id number].  Exp: 04/2011.    Anticoagulation Management Assessment/Plan:      The target INR is 2.0-3.0.  The next INR is due 02/02/2010.  Anticoagulation instructions were given to patient.  Results were reviewed/authorized by Eda Keys, PharmD.  She was notified by Eda Keys, pHarmD.         Prior Anticoagulation Instructions: INR 1.1  When restart take extra 2.5mg s dose the first day per Dr Elnoria Howard.  Resume 2.5mg s daily except 5mg s on Mondays, Wednesdays and Fridays. Recheck in one week.   Current Anticoagulation Instructions: INR 1.9  Take 2 tablets today and tomorrow.  Then return to normal dosing schedule of 2 tablets on Monday, Wednesday, and Friday and 1 tablet all other days.  Return to clinic in 2 weeks.

## 2010-10-02 NOTE — Assessment & Plan Note (Signed)
Summary: pt started on amio by db/fup from [facility]   Visit Type:  Follow-up Referring Provider:  Linde Gillis, MD Primary Provider:  None   History of Present Illness: The patient presents today for routine electrophysiology followup. She reports doing very well since last being seen in our clinic. She did have an episode of atrial flutter for which she was admitted to [facility] and initiated on amiodarone.  She has tolerated amiodarone without diffulty.  She denies any further ICD therapy.  The patient denies symptoms of palpitations, chest pain, shortness of breath, orthopnea, PND, lower extremity edema, dizziness, presyncope, syncope, or neurologic sequela. The patient is tolerating medications without difficulties and is otherwise without complaint today.   Current Medications (verified): 1)  Spironolactone 50 Mg Tabs (Spironolactone) .... Take One Tablet Once Daily 2)  Coreg 25 Mg  Tabs (Carvedilol) .... Take One Tablet Two Times A Day 3)  Adult Aspirin Ec Low Strength 81 Mg  Tbec (Aspirin) .... Once Daily 4)  Coumadin .... Managed By Cardiologist 5)  Vitamin D3 1000 Unit Tabs (Cholecalciferol) .... One By Mouth Daily 6)  Furosemide 40 Mg Tabs (Furosemide) .... Take 2 Tablets in The Morning and 1 Tablet in The Evening 7)  Potassium Chloride Crys Cr 20 Meq Cr-Tabs (Potassium Chloride Crys Cr) .... Take One Tablet By Mouth Daily 8)  Zestril 20 Mg Tabs (Lisinopril) .... Take One Tablet By Mouth Once Daily. 9)  Amiodarone Hcl 200 Mg Tabs (Amiodarone Hcl) .... Take One Tablet By Mouth Twice A Day  Allergies (verified): No Known Drug Allergies  Past History:  Past Medical History: VENTRICULAR FIBRILLATION  ARREST 12/10 with successful resucitation s/p ICD implant 09/05/09 ATRIAL FIBRILLATION/ atrial flutter OSTEOPENIA (ICD-733.90) HYPERTENSION, MODERATE (ICD-401.9) CARDIOMYOPATHY, PRIMARY NEC (ICD-425.4) CHF (ICD-428.0)    Past Surgical History: Reviewed history from  11/10/2009 and no changes required. Hysterectomy Cardiac cath - Holter: Up to 7 beats, unsustained vtech.   March 28, 2004 [facility] Cardiologist: Rec ICD Implant but Pt declined. November 2007 - DEXA: Osteopenia colonoscopy 12/07 mild left diverticulosis.Recheck 2017   ICD implantation with defibrillation threshold testing. 09/05/09  Social History: Reviewed history from 09/22/2009 and no changes required.  She lives here in [place] with her boyfriend.  They   have three adult children.  She has a remote history of tobacco abuse.   EtOH as stated above.  Denies any illicit substance use according to family.   Review of Systems       All systems are reviewed and negative except as listed in the HPI.   Vital Signs:  Patient profile:   76 year old female Height:      60 inches Weight:      157 pounds BMI:     30.77 Pulse rate:   59 / minute BP sitting:   90 / 58  (left arm)  Vitals Entered By: Laurance Flatten CMA (June 01, 2010 12:00 PM)  Physical Exam  General:  Well developed, well nourished, in no acute distress. Head:  normocephalic and atraumatic Eyes:  PERRLA/EOM intact; conjunctiva and lids normal. Mouth:  Teeth, gums and palate normal. Oral mucosa normal. Neck:  Neck supple, no JVD. No masses, thyromegaly or abnormal cervical nodes. Chest Wall:  ICD pocket is well healed Lungs:  Clear bilaterally to auscultation and percussion. Heart:  RRR, 2/6 SEM LLSB Abdomen:  Bowel sounds positive; abdomen soft and non-tender without masses, organomegaly, or hernias noted. No hepatosplenomegaly. Msk:  Back normal, normal gait. Muscle  strength and tone normal. Pulses:  pulses normal in all 4 extremities Extremities:  No clubbing or cyanosis. Neurologic:  Alert and oriented x 3.    ICD Specifications Following MD:  Hillis Range, MD     ICD Vendor:  [employer] Jude     ICD Model Number:  [phone]     ICD Serial Number:  [id number] ICD DOI:  09/05/2009     ICD Implanting MD:  Hillis Range,  MD  Lead 1:    Location: RA     DOI: 09/05/2009     Model #: 1888TC     Serial #: [id number]     Status: active Lead 2:    Location: RV     DOI: 09/05/2009     Model #: 9562     Serial #: [id number]     Status: active  Indications::  Sudden cardiac death   ICD Follow Up Battery Voltage:  90% V     Charge Time:  8.4 seconds     Battery Est. Longevity:  7.1-8.4 yrs Underlying rhythm:  SR   ICD Device Measurements Atrium:  Amplitude: 5.0 mV, Impedance: 400 ohms, Threshold: 0.75 V at 0.5 msec Right Ventricle:  Amplitude: 11.4 mV, Impedance: 530 ohms, Threshold: 0.75 V at 0.5 msec Shock Impedance: 53 ohms   Episodes MS Episodes:  0     Coumadin:  Yes Shock:  0     ATP:  0     Nonsustained:  0     Atrial Therapies:  0 Atrial Pacing:  36%     Ventricular Pacing:  <1%  Brady Parameters Mode DDD     Lower Rate Limit:  60     Upper Rate Limit 110 PAV 200     Sensed AV Delay:  200  Tachy Zones VF:  200     VT:  171     Next Remote Date:  08/30/2010     Next Cardiology Appt Due:  05/06/2011 Tech Comments:  NORMAL DEVICE FUNCTION.  NO EPISODES SINCE LAST CHECK.  NO CHANGES MADE. MERLIN TRANSMISSION 08-30-10. ROV IN 6 MTHS W/JA.Vella Kohler  June 01, 2010 12:38 PM MD Comments:  agree  Impression & Recommendations:  Problem # 1:  ATRIAL FLUTTER (ICD-427.32) stable decrease amiodarone to 200mg  daily we will check LFTs and TFTs today  Problem # 2:  VENTRICULAR FIBRILLATION (ICD-427.41) prior recurrent VF continue amiodarone longterm normal ICD function today no changes to device  Problem # 3:  CARDIOMYOPATHY, PRIMARY NEC (ICD-425.4) stable no changes  Problem # 4:  HYPERTENSION, MODERATE (ICD-401.9) typically, she is hypotensive  Other Orders: TLB-BMP (Basic Metabolic Panel-BMET) (80048-METABOL) TLB-TSH (Thyroid Stimulating Hormone) (84443-TSH) TLB-T4 (Thyrox), Free [phone]) TLB-Hepatic/Liver Function Pnl (80076-HEPATIC)  Patient Instructions: 1)  Your physician  wants you to follow-up in: 6 months with Dr Jacquiline Doe will receive a reminder letter in the mail two months in advance. If you don't receive a letter, please call our office to schedule the follow-up appointment. 2)  Merlin transmission on 08/30/10 3)  Your physician has recommended you make the following change in your medication: decrease Amiodarone to 200mg  daily  Appended Document: pt started on amio by db/fup from [facility] pt aware

## 2010-10-02 NOTE — Cardiovascular Report (Signed)
Summary: Office Visit   Office Visit   Imported By: Roderic Ovens 09/25/2009 13:16:00  _____________________________________________________________________  External Attachment:    Type:   Image     Comment:   External Document

## 2010-10-02 NOTE — Progress Notes (Signed)
Summary: B/P running low  Phone Note Call from Patient Call back at Home Phone [phone]   Caller: Lupita Dawn Summary of Call: PT B/P is low 61/54 Initial call taken by: Judie Grieve,  April 13, 2010 3:32 PM  Follow-up for Phone Call        spoke with pt, she states that her bp has now come back up into the 70's and she feels better. she states she thinks she got too hot, she is drinking plenty of fluids. she will go to the ER if bp drops again and she feels bad Deliah Goody, RN  April 13, 2010 4:59 PM

## 2010-10-02 NOTE — Letter (Signed)
Summary: OUTPATIENT RADIOLOGY RESULT  OUTPATIENT RADIOLOGY RESULT   Imported By: Arta Bruce 01/08/2010 15:27:50  _____________________________________________________________________  External Attachment:    Type:   Image     Comment:   External Document

## 2010-10-02 NOTE — Cardiovascular Report (Signed)
Summary: Office Visit Remote   Office Visit Remote   Imported By: Roderic Ovens 05/21/2010 15:31:04  _____________________________________________________________________  External Attachment:    Type:   Image     Comment:   External Document

## 2010-10-02 NOTE — Medication Information (Signed)
Summary: rov/sp  Anticoagulant Therapy  Managed by: Weston Brass, PharmD Referring MD: Johney Frame MD, Shann Medal MD: Shirlee Latch MD, [employer] Indication 1: Atrial Fibrillation Lab Used: [facility] [place] Site: [address] INR POC 2.6 INR RANGE 2.0-3.0  Dietary changes: no    Health status changes: no    Bleeding/hemorrhagic complications: no    Recent/future hospitalizations: no    Any changes in medication regimen? no    Recent/future dental: no  Any missed doses?: no       Is patient compliant with meds? yes       Allergies: No Known Drug Allergies  Anticoagulation Management History:      The patient is taking warfarin and comes in today for a routine follow up visit.  Positive risk factors for bleeding include an age of 74 years or older.  The bleeding index is 'intermediate risk'.  Positive CHADS2 values include History of CHF and History of HTN.  Negative CHADS2 values include Age > 74 years old.  Anticoagulation responsible provider: Shirlee Latch MD, Colbie Danner.  INR POC: 2.6.  Cuvette Lot#: [id number].  Exp: 06/2011.    Anticoagulation Management Assessment/Plan:      The target INR is 2.0-3.0.  The next INR is due 05/18/2010.  Anticoagulation instructions were given to patient.  Results were reviewed/authorized by Weston Brass, PharmD.  She was notified by Liana Gerold, PharmD Candidate.         Prior Anticoagulation Instructions: INR 2.2  Continue same dose of 2 tablets every day except 1 tablet on Sunday, Tuesday and Thursday.   Current Anticoagulation Instructions: INR 2.6  Continue with 2 tablets daily except 1 tablet Sun, Tue and Thur.  Return to clinic in 4 weeks.

## 2010-10-02 NOTE — Cardiovascular Report (Signed)
Summary: Office Visit   Office Visit   Imported By: Roderic Ovens 06/04/2010 16:04:14  _____________________________________________________________________  External Attachment:    Type:   Image     Comment:   External Document

## 2010-10-02 NOTE — Letter (Signed)
Summary: METABOLIC BIKE EXERCISE STUDY  METABOLIC BIKE EXERCISE STUDY   Imported By: Arta Bruce 10/19/2008 11:27:38  _____________________________________________________________________  External Attachment:    Type:   Image     Comment:   External Document

## 2010-10-02 NOTE — Medication Information (Signed)
Summary: ccr/ gd  Anticoagulant Therapy  Managed by: Weston Brass, PharmD Referring MD: Johney Frame MD, Shann Medal MD: Ladona Ridgel MD,[place] Indication 1: Atrial Fibrillation Lab Used: [facility] [place] Site: [address] INR POC 2.4 INR RANGE 2.0-3.0  Dietary changes: no    Health status changes: yes       Details: recently hospitalized   Recent/future hospitalizations: yes       Details: for afib and nonsustained vtach  Any changes in medication regimen? yes       Details: added amiodarone, s/p cardioversion  Recent/future dental: no  Any missed doses?: no       Is patient compliant with meds? yes       Allergies: No Known Drug Allergies  Anticoagulation Management History:      The patient is taking warfarin and comes in today for a routine follow up visit.  Positive risk factors for bleeding include an age of 74 years or older.  The bleeding index is 'intermediate risk'.  Positive CHADS2 values include History of CHF and History of HTN.  Negative CHADS2 values include Age > 60 years old.  Anticoagulation responsible provider: Ladona Ridgel MD,Vernel Langenderfer.  INR POC: 2.4.  Cuvette Lot#: [id number].  Exp: 06/2011.    Anticoagulation Management Assessment/Plan:      The target INR is 2.0-3.0.  The next INR is due 05/21/2010.  Anticoagulation instructions were given to patient.  Results were reviewed/authorized by Weston Brass, PharmD.         Prior Anticoagulation Instructions: INR 2.6  Continue with 2 tablets daily except 1 tablet Sun, Tue and Thur.  Return to clinic in 4 weeks.  Current Anticoagulation Instructions: INR 2.4 Continue taking 1 tablet on sunday, tuesday, and thursday. Take your saturday tablet down to 1 tablet instead of 2. keep monday, wednesday, and friday at 2 tablets. Because you started amiodarone, I need to see you sooner.

## 2010-10-02 NOTE — Miscellaneous (Signed)
Summary: Device preload  Clinical Lists Changes  Observations: Added new observation of ICD INDICATN: Sudden cardiac death (09/09/09 9:28) Added new observation of ICDLEADSTAT2: active (09/09/09 9:28) Added new observation of ICDLEADSER2: [id number] (09/09/09 9:28) Added new observation of ICDLEADMOD2: 7121  (2009-09-09 9:28) Added new observation of ICDLEADLOC2: RV  (09-09-2009 9:28) Added new observation of ICDLEADSTAT1: active  (09/09/2009 9:28) Added new observation of ICDLEADSER1: [id number]  (09-09-2009 9:28) Added new observation of ICDLEADMOD1: 1888TC  (September 09, 2009 9:28) Added new observation of ICDLEADLOC1: RA  (09/09/09 9:28) Added new observation of ICD IMP MD: Hillis Range, MD  (09/09/2009 9:28) Added new observation of ICDLEADDOI2: 09/05/2009  (Sep 09, 2009 9:28) Added new observation of ICDLEADDOI1: 09/05/2009  (September 09, 2009 9:28) Added new observation of ICD IMPL DTE: 09/05/2009  (09/09/09 9:28) Added new observation of ICD SERL#: [id number]  (2009/09/09 9:28) Added new observation of ICD MODL#: [id number]  (2009/09/09 5:78) Added new observation of ICDMANUFACTR: [employer]  (09-09-2009 9:28) Added new observation of ICD MD: Hillis Range, MD  (09-09-09 9:28)       ICD Specifications Following MD:  Hillis Range, MD     ICD Vendor:  [employer] Jude     ICD Model Number:  [id number]     ICD Serial Number:  [id number] ICD DOI:  09/05/2009     ICD Implanting MD:  Hillis Range, MD  Lead 1:    Location: RA     DOI: 09/05/2009     Model #: 1888TC     Serial #: [id number]     Status: active Lead 2:    Location: RV     DOI: 09/05/2009     Model #: [id number]     Serial #: [id number]     Status: active  Indications::  Sudden cardiac death

## 2010-10-02 NOTE — Letter (Signed)
Summary: Walking Aids Order Form  Walking Aids Order Form   Imported By: Kassie Mends 11/09/2009 12:34:37  _____________________________________________________________________  External Attachment:    Type:   Image     Comment:   External Document

## 2010-10-02 NOTE — Medication Information (Signed)
Summary: rov/sl  Anticoagulant Therapy  Managed by: Reina Fuse, PharmD Referring MD: Johney Frame MD, Fayrene Fearing PCP: None Supervising MD: Myrtis Ser MD, Tinnie Gens Indication 1: Atrial Fibrillation Lab Used: [facility]  Site: [address] INR POC 2.3 INR RANGE 2.0-3.0  Dietary changes: no    Health status changes: no    Bleeding/hemorrhagic complications: no    Recent/future hospitalizations: no    Any changes in medication regimen? no    Recent/future dental: no  Any missed doses?: no       Is patient compliant with meds? yes       Allergies: No Known Drug Allergies  Anticoagulation Management History:      The patient is taking warfarin and comes in today for a routine follow up visit.  Positive risk factors for bleeding include an age of 74 years or older.  The bleeding index is 'intermediate risk'.  Positive CHADS2 values include History of CHF and History of HTN.  Negative CHADS2 values include Age > 60 years old.  Anticoagulation responsible provider: Myrtis Ser MD, Tinnie Gens.  INR POC: 2.3.  Cuvette Lot#: [id number].  Exp: 07/2011.    Anticoagulation Management Assessment/Plan:      The target INR is 2.0-3.0.  The next INR is due 08/03/2010.  Anticoagulation instructions were given to patient.  Results were reviewed/authorized by Reina Fuse, PharmD.  She was notified by Reina Fuse PharmD.         Prior Anticoagulation Instructions: INR 2.0  Continue taking Coumadin 2.5 mg (1 tab) on all days except Coumadin 5 mg (2 tabs) on Fridays.  Return to clinic in 3 weeks.    Current Anticoagulation Instructions: INR 2.3  Continue taking Coumadin 1 tab (2.5 mg) all days except Coumadin 2 tabs (5 mg) on Fridays. Return to clinic in 4 weeks.

## 2010-10-02 NOTE — Letter (Signed)
Summary: CARDIOLOGY  CARDIOLOGY   Imported By: Arta Bruce 04/25/2010 12:36:29  _____________________________________________________________________  External Attachment:    Type:   Image     Comment:   External Document

## 2010-10-02 NOTE — Medication Information (Signed)
Summary: Coumadin Clinic  Anticoagulant Therapy  Managed by: Bethena Midget, RN, BSN Referring MD: Johney Frame MD, Shann Medal MD: Tenny Craw MD, Gunnar Fusi Indication 1: Atrial Fibrillation Lab Used: [facility] Site: [address] PT 12.4 INR POC 1.2 INR RANGE 2.0-3.0  Dietary changes: no    Health status changes: no    Bleeding/hemorrhagic complications: no    Recent/future hospitalizations: yes       Details: discharged from [facility] on 09/07/09  Any changes in medication regimen? no    Recent/future dental: no  Any missed doses?: no       Is patient compliant with meds? yes      Comments: Results given to Ellwood Sayers family friend  Allergies: No Known Drug Allergies  Anticoagulation Management History:      Her anticoagulation is being managed by telephone today.  Positive risk factors for bleeding include an age of 78 years or older.  The bleeding index is 'intermediate risk'.  Positive CHADS2 values include History of CHF and History of HTN.  Negative CHADS2 values include Age > 37 years old.  Prothrombin time is 12.4.  Anticoagulation responsible provider: Tenny Craw MD, Gunnar Fusi.  INR POC: 1.2.    Anticoagulation Management Assessment/Plan:      Anticoagulation instructions were given to family friend.  Results were reviewed/authorized by Bethena Midget, RN, BSN.  She was notified by Bethena Midget, RN, BSN.         Current Anticoagulation Instructions: INR 1.2 Change dose to 2.5mg s daily except 5mg s on MWF. Orders sent to [facility].   Appended Document: Coumadin Clinic Orders sent to [facility] to redraw INR on 09/15/09.

## 2010-10-02 NOTE — Medication Information (Signed)
Summary: rov/ewj  Anticoagulant Therapy  Managed by: Bethena Midget, RN, BSN Referring MD: Johney Frame MD, Shann Medal MD: Johney Frame MD, Fayrene Fearing Indication 1: Atrial Fibrillation Lab Used: [facility] [place] Site: [address] INR POC 1.1 INR RANGE 2.0-3.0  Dietary changes: no    Health status changes: no    Bleeding/hemorrhagic complications: no    Recent/future hospitalizations: no    Any changes in medication regimen? no    Recent/future dental: no  Any missed doses?: yes     Details: Last dose taken on 01/06/10  Is patient compliant with meds? yes      Comments: Pending Colonoscopy tomorrow Dr Elnoria Howard.   Allergies: No Known Drug Allergies  Anticoagulation Management History:      The patient is taking warfarin and comes in today for a routine follow up visit.  Positive risk factors for bleeding include an age of 74 years or older.  The bleeding index is 'intermediate risk'.  Positive CHADS2 values include History of CHF and History of HTN.  Negative CHADS2 values include Age > 74 years old.  Anticoagulation responsible provider: Tanairi Cypert MD, Fayrene Fearing.  INR POC: 1.1.  Cuvette Lot#: [id number].  Exp: 04/2011.    Anticoagulation Management Assessment/Plan:      The target INR is 2.0-3.0.  The next INR is due 01/19/2010.  Anticoagulation instructions were given to patient.  Results were reviewed/authorized by Bethena Midget, RN, BSN.  She was notified by Bethena Midget, RN, BSN.         Prior Anticoagulation Instructions: INR 2.0  Continue on same dosage 1 tablet daily except 2 tablets on Mondays, Wednesdays, and Fridays.  Recheck in 4 weeks.    Current Anticoagulation Instructions: INR 1.1  When restart take extra 2.5mg s dose the first day per Dr Elnoria Howard.  Resume 2.5mg s daily except 5mg s on Mondays, Wednesdays and Fridays. Recheck in one week.

## 2010-10-02 NOTE — Medication Information (Signed)
Summary: rov/ej  Anticoagulant Therapy  Managed by: Earvin Hansen, PharmD Referring MD: Johney Frame MD, Fayrene Fearing PCP: None Supervising MD: Jens Som MD, Arlys John Indication 1: Atrial Fibrillation Lab Used: [facility] [place] Site: [address] INR RANGE 2.0-3.0  Dietary changes: yes       Details: More greens around the Thanksgiving holiday  Health status changes: no    Bleeding/hemorrhagic complications: no    Recent/future hospitalizations: no    Any changes in medication regimen? no    Recent/future dental: no  Any missed doses?: no       Is patient compliant with meds? yes       Allergies: No Known Drug Allergies  Anticoagulation Management History:      Positive risk factors for bleeding include an age of 65 years or older.  The bleeding index is 'intermediate risk'.  Positive CHADS2 values include History of CHF and History of HTN.  Negative CHADS2 values include Age > 4 years old.  Anticoagulation responsible provider: Jens Som MD, Arlys John.  Exp: 07/2011.    Anticoagulation Management Assessment/Plan:      The target INR is 2.0-3.0.  The next INR is due 08/17/2010.  Anticoagulation instructions were given to patient.  Results were reviewed/authorized by Earvin Hansen, PharmD.         Prior Anticoagulation Instructions: INR 2.3  Continue taking Coumadin 1 tab (2.5 mg) all days except Coumadin 2 tabs (5 mg) on Fridays. Return to clinic in 4 weeks.   Current Anticoagulation Instructions: Take 1 tablet (2.5 mg) daily except for 2 tablets (5 mg) on Tuesdays and Fridays.  INR=

## 2010-10-02 NOTE — Miscellaneous (Signed)
Summary: Mammo Normal . . . Repeat 11/2010  Clinical Lists Changes  Observations: Added new observation of MAMMRECACT: Screening mammogram in 1 year.    (11/03/2009 8:16) Added new observation of MAMMOGRAM: No specific mammographic evidence of malignancy.  Assessment: BIRADS 1. Assessment: BIRADS 2.  (11/03/2009 8:16)      Mammogram  Procedure date:  11/03/2009  Findings:      No specific mammographic evidence of malignancy.  Assessment: BIRADS 1. Assessment: BIRADS 2.   Comments:      Screening mammogram in 1 year.    Performed at [facility].

## 2010-10-02 NOTE — Progress Notes (Signed)
Summary: [facility]   [facility]   Imported By: Roderic Ovens 01/01/2010 16:38:49  _____________________________________________________________________  External Attachment:    Type:   Image     Comment:   External Document

## 2010-10-02 NOTE — Medication Information (Signed)
Summary: CCR/JSS  Anticoagulant Therapy  Managed by: Charolotte Eke, PharmD Referring MD: Johney Frame MD, Shann Medal MD: Antoine Poche MD, Fayrene Fearing Indication 1: Atrial Fibrillation Lab Used: [facility] Site: [employer] INR POC 1.9 INR RANGE 2.0-3.0  Dietary changes: no    Health status changes: no    Bleeding/hemorrhagic complications: no    Recent/future hospitalizations: no    Any changes in medication regimen? no    Recent/future dental: no  Any missed doses?: no       Is patient compliant with meds? yes       Current Medications (verified): 1)  Aldactone 25 Mg  Tabs (Spironolactone) .... Once Daily(Cards) 2)  Coreg 25 Mg  Tabs (Carvedilol) .... Two Times A Day(Cards) 3)  Adult Aspirin Ec Low Strength 81 Mg  Tbec (Aspirin) .... Once Daily 4)  Coumadin .... Managed By Cardiologist 5)  Vitamin D3 1000 Unit Tabs (Cholecalciferol) .... One By Mouth Daily 6)  Furosemide 40 Mg Tabs (Furosemide) .... Take One Tablet By Mouth Daily. 7)  Potassium Chloride Crys Cr 20 Meq Cr-Tabs (Potassium Chloride Crys Cr) .... Take One Tablet By Mouth Daily 8)  Protonix 40 Mg Tbec (Pantoprazole Sodium) .... Take One Tablet By Mouth Once Daily. 9)  Zestril 20 Mg Tabs (Lisinopril) .... Take One Tablet By Mouth Once Daily.  Allergies (verified): No Known Drug Allergies  Anticoagulation Management History:      The patient is taking warfarin and comes in today for a routine follow up visit.  Positive risk factors for bleeding include an age of 74 years or older.  The bleeding index is 'intermediate risk'.  Positive CHADS2 values include History of CHF and History of HTN.  Negative CHADS2 values include Age > 63 years old.  Anticoagulation responsible provider: Antoine Poche MD, Fayrene Fearing.  INR POC: 1.9.  Cuvette Lot#: [id number].  Exp: 12/02/2010.    Anticoagulation Management Assessment/Plan:      The target INR is 2.0-3.0.  The next INR is due 11/16/2009.  Anticoagulation instructions were given to  patient.  Results were reviewed/authorized by Charolotte Eke, PharmD.  She was notified by Charolotte Eke, PharmD.         Prior Anticoagulation Instructions: INR 2.4 Continue 2.5mg s daily except 5mg s on MWF. Recheck in 2 weeks.   Current Anticoagulation Instructions: Take 2 tablets today then resume 1 tablet daily except 2 tablets on Mon Wed Fri.

## 2010-10-02 NOTE — Letter (Signed)
Summary: Discharge Summary  Discharge Summary   Imported By: Earl Many 12/09/2009 12:46:01  _____________________________________________________________________  External Attachment:    Type:   Image     Comment:   External Document

## 2010-10-02 NOTE — Miscellaneous (Signed)
Summary: [facility] Interim Order   [facility]'s Interim Order   Imported By: Roderic Ovens 03/20/2010 15:24:41  _____________________________________________________________________  External Attachment:    Type:   Image     Comment:   External Document

## 2010-10-02 NOTE — Medication Information (Signed)
Summary: rov/ewj  Anticoagulant Therapy  Managed by: Weston Brass, PharmD Referring MD: Johney Frame MD, Shann Medal MD: Jens Som MD, Arlys John Indication 1: Atrial Fibrillation Lab Used: [facility] [place] Site: [address] INR POC 2.2 INR RANGE 2.0-3.0  Dietary changes: no    Health status changes: no    Bleeding/hemorrhagic complications: no    Recent/future hospitalizations: no    Any changes in medication regimen? no    Recent/future dental: no  Any missed doses?: no       Is patient compliant with meds? yes       Allergies: No Known Drug Allergies  Anticoagulation Management History:      The patient is taking warfarin and comes in today for a routine follow up visit.  Positive risk factors for bleeding include an age of 89 years or older.  The bleeding index is 'intermediate risk'.  Positive CHADS2 values include History of CHF and History of HTN.  Negative CHADS2 values include Age > 68 years old.  Anticoagulation responsible provider: Jens Som MD, Arlys John.  INR POC: 2.2.  Cuvette Lot#: [id number].  Exp: 06/2011.    Anticoagulation Management Assessment/Plan:      The target INR is 2.0-3.0.  The next INR is due 04/20/2010.  Anticoagulation instructions were given to patient.  Results were reviewed/authorized by Weston Brass, PharmD.  She was notified by Weston Brass PharmD.         Prior Anticoagulation Instructions: INR 3.1  Take 1 tablet today, then start taking 2 tablets daily except 1 tablet on Sundays, Tuesdays, and Thursdays.  Recheck in 3 weeks.    Current Anticoagulation Instructions: INR 2.2  Continue same dose of 2 tablets every day except 1 tablet on Sunday, Tuesday and Thursday.

## 2010-10-02 NOTE — Miscellaneous (Signed)
Summary: Home Health Certification/Care Plan   Home Health Certification/Care Plan   Imported By: Roderic Ovens 10/02/2009 16:08:07  _____________________________________________________________________  External Attachment:    Type:   Image     Comment:   External Document

## 2010-10-02 NOTE — Letter (Signed)
Summary: [facility] Surgical Clearance   [facility] Surgical Clearance   Imported By: Roderic Ovens 02/14/2010 15:51:19  _____________________________________________________________________  External Attachment:    Type:   Image     Comment:   External Document

## 2010-10-02 NOTE — Assessment & Plan Note (Signed)
Summary: eph. gd   Referring Provider:  Linde Gillis, MD  CC:  dizziness.  History of Present Illness: This is a 76 year old white female patient who was recently hospitalized with new onset atrial flutter. She underwent successful DC cardioversion and was started on amiodarone. She also has a history of a nonischemic cardiomyopathy ejection fraction 15%. She is on a [facility] transplant list. She also has a [employer] Jude dual chamber implantable cardioverter defibrillator for ventricular fibrillation arrest.  The patient states that she is dizzy after she takes amiodarone if she stands up. This is her only new medication and she insists that this is what's causing it. She denies any presyncope or syncope. The dizziness only occurs after she takes the medications and if she stands up quickly.  Current Medications (verified): 1)  Spironolactone 50 Mg Tabs (Spironolactone) .... Take One Tablet Once Daily 2)  Coreg 25 Mg  Tabs (Carvedilol) .... Take One Tablet Two Times A Day 3)  Adult Aspirin Ec Low Strength 81 Mg  Tbec (Aspirin) .... Once Daily 4)  Coumadin .... Managed By Cardiologist 5)  Vitamin D3 1000 Unit Tabs (Cholecalciferol) .... One By Mouth Daily 6)  Furosemide 40 Mg Tabs (Furosemide) .... Take 2 Tablets in The Morning and 1 Tablet in The Evening 7)  Potassium Chloride Crys Cr 20 Meq Cr-Tabs (Potassium Chloride Crys Cr) .... Take One Tablet By Mouth Daily 8)  Zestril 20 Mg Tabs (Lisinopril) .... Take One Tablet By Mouth Once Daily. 9)  Amiodarone Hcl 200 Mg Tabs (Amiodarone Hcl) .... Take One Tablet By Mouth Twice A Day  Allergies: No Known Drug Allergies  Past History:  Past Medical History: Last updated: 11/10/2009 VENTRICULAR FIBRILLATION  ARREST 12/10 with successful resucitation s/p ICD implant 09/05/09 ATRIAL FIBRILLATION, HX OF (ICD-V12.59) OSTEOPENIA (ICD-733.90) HYPERTENSION, MODERATE (ICD-401.9) CARDIOMYOPATHY, PRIMARY NEC (ICD-425.4) CHF (ICD-428.0)     Past Surgical History: Last updated: 11/10/2009 Hysterectomy Cardiac cath - Holter: Up to 7 beats, unsustained vtech.   March 28, 2004 [facility] Cardiologist: Rec ICD Implant but Pt declined. November 2007 - DEXA: Osteopenia colonoscopy 12/07 mild left diverticulosis.Recheck 2017   ICD implantation with defibrillation threshold testing. 09/05/09  Review of Systems       see history of present illness  Vital Signs:  Patient profile:   76 year old female Height:      60 inches Weight:      152 pounds BMI:     29.79 Pulse rate:   63 / minute Pulse (ortho):   68 / minute Resp:     14 per minute BP sitting:   95 / 66  (left arm) BP standing:   84 / 61  Vitals Entered By: Kem Parkinson (May 23, 2010 11:24 AM)  Serial Vital Signs/Assessments:  Time      Position  BP       Pulse  Resp  Temp     By           Lying RA  86/62    60                    Kimalexis Barnes           Sitting   87/64    64                    Kimalexis Barnes           Standing  84/61    68  Kimalexis Barnes   Physical Exam  General:   Well-nournished, in no acute distress. Neck: No JVD, HJR, Bruit, or thyroid enlargement Lungs: No tachypnea, clear without wheezing, rales, or rhonchi Cardiovascular: RRR, PMI not displaced, heart sounds normal, positive S4 and 2/6 systolic murmur at the left sternal border, no bruit, thrill, or heave. Abdomen: BS normal. Soft without organomegaly, masses, lesions or tenderness. Extremities: trace of edema bilaterally,without cyanosis, clubbing. Good distal pulses bilateral SKin: Warm, no lesions or rashes  Musculoskeletal: No deformities Neuro: no focal signs    EKG  Procedure date:  05/23/2010  Findings:      normal sinus rhythm with nonspecific ST-T wave changes T-wave inversion in inferolateral   ICD Specifications Following MD:  Hillis Range, MD     ICD Vendor:  St Jude     ICD Model Number:  [id number]     ICD Serial Number:  [id number] ICD  DOI:  09/05/2009     ICD Implanting MD:  Hillis Range, MD  Lead 1:    Location: RA     DOI: 09/05/2009     Model #: [id number]     Serial #: [id number]     Status: active Lead 2:    Location: RV     DOI: 09/05/2009     Model #: [id number]     Serial #: [id number]     Status: active  Indications::  Sudden cardiac death   Episodes Coumadin:  Yes  Brady Parameters Mode DDD     Lower Rate Limit:  60     Upper Rate Limit 120 PAV 200     Sensed AV Delay:  200  Tachy Zones VF:  200     VT:  171     Impression & Recommendations:  Problem # 1:  ATRIAL FLUTTER (ICD-427.32) Patient converted to normal sinus rhythm with DC cardioversion and amiodarone. Her updated medication list for this problem includes:    Coreg 25 Mg Tabs (Carvedilol) .[name]... Take one tablet two times a day    Adult Aspirin Ec Low Strength 81 Mg Tbec (Aspirin) ..... Once daily    Amiodarone Hcl 200 Mg Tabs (Amiodarone hcl) .[name]... Take one tablet by mouth twice a day  Problem # 2:  DIZZINESS (ICD-780.4) Patient complains of dizziness after she takes her amiodarone. Her blood pressure is low but she is not orthostatic. Blood pressures were between 84 and 89 systolic. She is going to try to stagger her medicines rather than making any changes at this point. If this patient continues to be dizzy he should we may need to decrease her Zestril.  Problem # 3:  AICD, [employer], MODEL  [phone] (ICD-V45.02) patient has an ICD for ventricular fibrillation arrest. She is on the transplant list at [facility]. She has an appointment with them next week.  Problem # 4:  CARDIOMYOPATHY, PRIMARY NEC (ICD-425.4) Patient has a nonischemic cardiomyopathy ejection fraction 15%. She is currently compensated without evidence of heart failure. Her updated medication list for this problem includes:    Spironolactone 50 Mg Tabs (Spironolactone) .[name]... Take one tablet once daily    Coreg 25 Mg Tabs (Carvedilol) .[name]... Take one  tablet two times a day    Adult Aspirin Ec Low Strength 81 Mg Tbec (Aspirin) ..... Once daily    Furosemide 40 Mg Tabs (Furosemide) .[name]... Take 2 tablets in the morning and 1 tablet in the evening    Zestril 20 Mg Tabs (Lisinopril) .Marland KitchenMarland KitchenMarland KitchenMarland Kitchen  Take one tablet by mouth once daily.    Amiodarone Hcl 200 Mg Tabs (Amiodarone hcl) .[name]... Take one tablet by mouth twice a day  Patient Instructions: 1)  Your physician recommends that you schedule a follow-up appointment in: Dr.Donzel Romack Friday Sept 30th.

## 2010-10-02 NOTE — Medication Information (Signed)
Summary: Coumadin Clinic  Anticoagulant Therapy  Managed by: Cloyde Reams, RN, BSN Referring MD: Johney Frame MD, Shann Medal MD: Myrtis Ser MD, Tinnie Gens Indication 1: Atrial Fibrillation Lab Used: [facility] Site: [address] PT 17.3 INR POC 1.7 INR RANGE 2.0-3.0  Dietary changes: yes       Details: Pt states she has been avoiding foods that contain vit K.  Health status changes: yes       Details: c/o coughing and mid sternal pain secondary to cough.  Pt states started after discharge.  Bleeding/hemorrhagic complications: no     Any changes in medication regimen? no    Recent/future dental: no  Any missed doses?: no       Is patient compliant with meds? yes      Comments: Pt denies sputum production or fever.  Allergies: No Known Drug Allergies  Anticoagulation Management History:      Her anticoagulation is being managed by telephone today.  Positive risk factors for bleeding include an age of 75 years or older.  The bleeding index is 'intermediate risk'.  Positive CHADS2 values include History of CHF and History of HTN.  Negative CHADS2 values include Age > 59 years old.  Prothrombin time is 17.3.  Anticoagulation responsible provider: Myrtis Ser MD, Tinnie Gens.  INR POC: 1.7.    Anticoagulation Management Assessment/Plan:      The target INR is 2.0-3.0.  The next INR is due 09/25/2009.  Anticoagulation instructions were given to patient.  Results were reviewed/authorized by Cloyde Reams, RN, BSN.  She was notified by Cloyde Reams RN.         Prior Anticoagulation Instructions: INR 1.4  Called spoke with pt.  Advised to take 5mg  today then resume same dosage 2.5mg  daily except 5mg  on MWF.  Recheck on Tuesday 09/19/09.  Orders faxed to [facility].  Current Anticoagulation Instructions: INR 1.7  Called spoke with pt.  Advised to incr dosage to 5mg  (2tabs) daily except 2.5mg  (1 tab) on MWF. Recheck on 09/25/09.  Faxed order to [place] with coumadin instructions and PT/INR redraw  orders for 09/25/09.

## 2010-10-02 NOTE — Medication Information (Signed)
Summary: rov-tp  Anticoagulant Therapy  Managed by: Eda Keys, PharmD Referring MD: Johney Frame MD, Shann Medal MD: Ladona Ridgel MD, Sharlot Gowda Indication 1: Atrial Fibrillation Lab Used: [facility] [place] Site: [address] INR POC 2.3 INR RANGE 2.0-3.0  Dietary changes: no    Health status changes: no    Bleeding/hemorrhagic complications: no    Recent/future hospitalizations: no    Any changes in medication regimen? yes       Details: added a GERD med, possibly protonix  Recent/future dental: no  Any missed doses?: no       Is patient compliant with meds? yes       Allergies: No Known Drug Allergies  Anticoagulation Management History:      The patient is taking warfarin and comes in today for a routine follow up visit.  Positive risk factors for bleeding include an age of 74 years or older.  The bleeding index is 'intermediate risk'.  Positive CHADS2 values include History of CHF and History of HTN.  Negative CHADS2 values include Age > 74 years old.  Anticoagulation responsible provider: Ladona Ridgel MD, Sharlot Gowda.  INR POC: 2.3.  Cuvette Lot#: [id number].  Exp: 01/01/2011.    Anticoagulation Management Assessment/Plan:      The target INR is 2.0-3.0.  The next INR is due 12/14/2009.  Anticoagulation instructions were given to patient.  Results were reviewed/authorized by Eda Keys, PharmD.  She was notified by Eda Keys.         Prior Anticoagulation Instructions: Take 2 tablets today then resume 1 tablet daily except 2 tablets on Mon Wed Fri.  Current Anticoagulation Instructions: INR 2.3  Continue taking 2 tablets on Monday, Wednesday, and Friday and take 1 tablet all other days.  Return to clinic in 4 weeks.

## 2010-10-02 NOTE — Medication Information (Signed)
Summary: [facility]  Anticoagulant Therapy  Managed by: Cloyde Reams, RN, BSN Referring MD: Johney Frame MD, Shann Medal MD: Ladona Ridgel MD, Sharlot Gowda Indication 1: Atrial Fibrillation Lab Used: [facility] Site: [address] PT 14.0 INR POC 1.4 INR RANGE 2.0-3.0    Bleeding/hemorrhagic complications: no     Any changes in medication regimen? no     Any missed doses?: no        Comments: Pt was discharged on 2.5mg  daily on 09/07/09.  Pt has 2.5mg  tablets.  Allergies: No Known Drug Allergies  Anticoagulation Management History:      Her anticoagulation is being managed by telephone today.  Positive risk factors for bleeding include an age of 74 years or older.  The bleeding index is 'intermediate risk'.  Positive CHADS2 values include History of CHF and History of HTN.  Negative CHADS2 values include Age > 14 years old.  Prothrombin time is 14.0.  Anticoagulation responsible provider: Ladona Ridgel MD, Sharlot Gowda.  INR POC: 1.4.    Anticoagulation Management Assessment/Plan:      The next INR is due 09/19/2009.  Anticoagulation instructions were given to patient.  Results were reviewed/authorized by Cloyde Reams, RN, BSN.  She was notified by Cloyde Reams RN.         Prior Anticoagulation Instructions: INR 1.2 Change dose to 2.5mg s daily except 5mg s on MWF. Orders sent to [facility].   Current Anticoagulation Instructions: INR 1.4  Called spoke with pt.  Advised to take 5mg  today then resume same dosage 2.5mg  daily except 5mg  on MWF.  Recheck on Tuesday 09/19/09.  Orders faxed to [facility].

## 2010-10-02 NOTE — Progress Notes (Signed)
Summary: b/p issues   Phone Note Call from Patient Call back at Home Phone [phone]   Caller: friend- [name]  Reason for Call: Talk to Nurse Details for Reason: c/o b/p issues. today 65/43. 73/48 . pcp was not contacted . Initial call taken by: Lorne Skeens,  May 08, 2010 3:16 PM  Follow-up for Phone Call        I spoke with [name] and asked him to contact the pt's primary cardiologist Dr Glori Luis about further BP management.  The pt's BP tends to run low.  Dr Johney Frame manages pt for ICD and Atrial Flutter.  Follow-up by: Julieta Gutting, RN, BSN,  May 08, 2010 4:24 PM

## 2010-10-02 NOTE — Assessment & Plan Note (Signed)
Summary: [facility]   Referring Provider:  Linde Gillis, MD  CC:  [facility]/ pt states that she feels device is "pulling" or stinging.  It feels like it catches when she moves..  History of Present Illness: The patient presents today for routine electrophysiology followup. He reports doing very well since last being seen in our clinic. The patient denies symptoms of palpitations, chest pain, shortness of breath, orthopnea, PND, lower extremity edema, dizziness, presyncope, syncope, or neurologic sequela. The patient is tolerating medications without difficulties and is otherwise without complaint today.   Current Medications (verified): 1)  Spironolactone 50 Mg Tabs (Spironolactone) .... Take One Tablet Once Daily 2)  Coreg 25 Mg  Tabs (Carvedilol) .... Take One Tablet Two Times A Day 3)  Adult Aspirin Ec Low Strength 81 Mg  Tbec (Aspirin) .... Once Daily 4)  Coumadin .... Managed By Cardiologist 5)  Vitamin D3 1000 Unit Tabs (Cholecalciferol) .... One By Mouth Daily 6)  Furosemide 40 Mg Tabs (Furosemide) .... Take 2 Tablets in The Morning and 1 Tablet in The Evening 7)  Potassium Chloride Crys Cr 20 Meq Cr-Tabs (Potassium Chloride Crys Cr) .... Take One Tablet By Mouth Daily 8)  Zestril 20 Mg Tabs (Lisinopril) .... Take One Tablet By Mouth Once Daily. 9)  Omeprazole 40 Mg Cpdr (Omeprazole) .... Take One Tablet By Mouth Once Daily.  Allergies (verified): No Known Drug Allergies  Past History:  Past Medical History: Reviewed history from 11/10/2009 and no changes required. VENTRICULAR FIBRILLATION  ARREST 12/10 with successful resucitation s/p ICD implant 09/05/09 ATRIAL FIBRILLATION, HX OF (ICD-V12.59) OSTEOPENIA (ICD-733.90) HYPERTENSION, MODERATE (ICD-401.9) CARDIOMYOPATHY, PRIMARY NEC (ICD-425.4) CHF (ICD-428.0)    Past Surgical History: Reviewed history from 11/10/2009 and no changes required. Hysterectomy Cardiac cath - Holter: Up to 7 beats, unsustained vtech.   March 28, 2004  [facility] Cardiologist: Rec ICD Implant but Pt declined. November 2007 - DEXA: Osteopenia colonoscopy 12/07 mild left diverticulosis.Recheck 2017   ICD implantation with defibrillation threshold testing. 09/05/09  Social History: Reviewed history from 09/22/2009 and no changes required.  She lives here in [place] with her boyfriend.  They   have three adult children.  She has a remote history of tobacco abuse.   EtOH as stated above.  Denies any illicit substance use according to family.   Review of Systems       All systems are reviewed and negative except as listed in the HPI.   Vital Signs:  Patient profile:   76 year old female Height:      60 inches Weight:      157 pounds BMI:     30.77 Pulse rate:   48 / minute Pulse rhythm:   regular BP sitting:   88 / 62  (left arm) Cuff size:   regular  Vitals Entered By: Judithe Modest CMA (February 05, 2010 9:32 AM)  Physical Exam  General:  Well developed, well nourished, in no acute distress. Head:  normocephalic and atraumatic Eyes:  PERRLA/EOM intact; conjunctiva and lids normal. Mouth:  Teeth, gums and palate normal. Oral mucosa normal. Neck:  Neck supple, no JVD. No masses, thyromegaly or abnormal cervical nodes. Chest Wall:  ICD pocket is well healed Lungs:  Clear bilaterally to auscultation and percussion. Heart:  RRR, 2/6 SEM LLSB Abdomen:  Bowel sounds positive; abdomen soft and non-tender without masses, organomegaly, or hernias noted. No hepatosplenomegaly. Msk:  Back normal, normal gait. Muscle strength and tone normal. Pulses:  pulses normal in all  4 extremities Extremities:  No clubbing or cyanosis. Neurologic:  Alert and oriented x 3.    ICD Specifications Following MD:  Hillis Range, MD     ICD Vendor:  St Jude     ICD Model Number:  [phone]     ICD Serial Number:  [id number] ICD DOI:  09/05/2009     ICD Implanting MD:  Hillis Range, MD  Lead 1:    Location: [facility]     DOI: 09/05/2009     Model #: 1888TC     Serial #:  [id number]     Status: active Lead 2:    Location: RV     DOI: 09/05/2009     Model #: 6578     Serial #: [id number]     Status: active  Indications::  Sudden cardiac death   ICD Follow Up Charge Time:  8.4 seconds     Battery Est. Longevity:  7.2-8.19yrs Underlying rhythm:  sr   ICD Device Measurements Atrium:  Amplitude: 3.8 mV, Impedance: 360 ohms, Threshold: 0.5 V at 0.5 msec Right Ventricle:  Amplitude: 12.0 mV, Impedance: 450 ohms, Threshold: 0.75 V at 0.5 msec Shock Impedance: 54 ohms   Episodes MS Episodes:  9     Percent Mode Switch:  <1%     Coumadin:  Yes Shock:  0     ATP:  0     Nonsustained:  0     Atrial Therapies:  0  Brady Parameters Mode DDD     Lower Rate Limit:  60     Upper Rate Limit 120 PAV 200     Sensed AV Delay:  200  Tachy Zones VF:  200     VT:  171     Next Remote Date:  05/10/2010     Next Cardiology Appt Due:  02/01/2011 Tech Comments:  PVC % 6.7.  9 MODE SWITCHES-LONGEST WAS 10 SECONDS--1:1 ATACH OR SVT.  NORMAL DEVICE FUNCTION.  TURNED MORPHOLOGY TO PASSIVE.  ENROLL IN [place].  ROV IN 1 YR.  Vella Kohler  February 05, 2010 9:53 AM MD Comments:  agree  Impression & Recommendations:  Problem # 1:  VENTRICULAR FIBRILLATION (ICD-427.41) doing well without any recent sustained ventricular arrhythmias continue coreg she has frequent PVCs. I would like to increase coreg but am limited at this time by low BP no changes today  Problem # 2:  CARDIOMYOPATHY, PRIMARY NEC (ICD-425.4) stable euvolemic on exam no changes today  Problem # 3:  HYPERTENSION, MODERATE (ICD-401.9)  controlled  Her updated medication list for this problem includes:    Spironolactone 50 Mg Tabs (Spironolactone) .[name]... Take one tablet once daily    Coreg 25 Mg Tabs (Carvedilol) .[name]... Take one tablet two times a day    Adult Aspirin Ec Low Strength 81 Mg Tbec (Aspirin) ..... Once daily    Furosemide 40 Mg Tabs (Furosemide) .[name]... Take 2 tablets in the morning and 1 tablet in the  evening    Zestril 20 Mg Tabs (Lisinopril) .[name]... Take one tablet by mouth once daily.  Patient Instructions: 1)  Your physician recommends that you schedule a follow-up appointment in: 12 months with Dr Johney Frame

## 2010-10-02 NOTE — Procedures (Signed)
Summary: wound check/jml   Current Medications (verified): 1)  Lasix 80 Mg  Tabs (Furosemide) .... One By Mouth Iin The Am and Half in The Pm 2)  Aldactone 25 Mg  Tabs (Spironolactone) .... Once Daily(Cards) 3)  Coreg 25 Mg  Tabs (Carvedilol) .... Two Times A Day(Cards) 4)  Adult Aspirin Ec Low Strength 81 Mg  Tbec (Aspirin) .... Once Daily 5)  Coumadin .... Managed By Cardiologist 6)  Vitamin D3 1000 Unit Tabs (Cholecalciferol) .... One By Mouth Daily  Allergies (verified): No Known Drug Allergies   ICD Specifications Following MD:  Hillis Range, MD     ICD Vendor:  St Jude     ICD Model Number:  [id number]     ICD Serial Number:  [id number] ICD DOI:  09/05/2009     ICD Implanting MD:  Hillis Range, MD  Lead 1:    Location: RA     DOI: 09/05/2009     Model #: 1888TC     Serial #: [id number]     Status: active Lead 2:    Location: RV     DOI: 09/05/2009     Model #: 9518     Serial #: [id number]     Status: active  Indications::  Sudden cardiac death   ICD Follow Up Remote Check?  No Charge Time:  8.4 seconds     Battery Est. Longevity:  5 years Underlying rhythm:  SR   ICD Device Measurements Atrium:  Amplitude: 2.9 mV, Impedance: 360 ohms, Threshold: 0.5 V at 0.5 msec Right Ventricle:  Amplitude: 11.4 mV, Impedance: 450 ohms, Threshold: 0.75 V at 0.5 msec Shock Impedance: 35 ohms   Episodes MS Episodes:  2     Percent Mode Switch:  <1%     Coumadin:  Yes Shock:  0     ATP:  0     Nonsustained:  0     Atrial Pacing:  4.3%     Ventricular Pacing:  <1%  Tachy Zones VF:  200     VT:  171     Next Cardiology Appt Due:  01/19/2010 Tech Comments:  Steri strips removed today, no redness or edema noted.  Heart rate today at rest SR in the 90's with frequent PVC's.  31.8% heart rates > 100bpm, 12% PVC's.  Patient education on device discharge protocol and activity restrictions.  ROV 3 months with Dr. Johney Frame. Altha Harm, LPN  September 21, 2009 10:28 AM

## 2010-10-02 NOTE — Consult Note (Signed)
Summary: [facility]   Imported By: Marylou Mccoy 02/06/2010 13:22:58  _____________________________________________________________________  External Attachment:    Type:   Image     Comment:   External Document

## 2010-10-02 NOTE — Medication Information (Signed)
Summary: Coumadin Clinic  Anticoagulant Therapy  Managed by: Bethena Midget, RN, BSN Referring MD: Johney Frame MD, Shann Medal MD: Graciela Husbands MD, Viviann Spare Indication 1: Atrial Fibrillation Lab Used: [facility] Site: [address] PT 20.7 INR POC 2.1 INR RANGE 2.0-3.0  Dietary changes: no    Health status changes: no    Bleeding/hemorrhagic complications: no    Recent/future hospitalizations: no    Any changes in medication regimen? no    Recent/future dental: no  Any missed doses?: no       Is patient compliant with meds? yes      Comments: Spoke with pt she states she has green tablet and takes 1 daily except 2 on MWF.   Allergies: No Known Drug Allergies  Anticoagulation Management History:      Her anticoagulation is being managed by telephone today.  Positive risk factors for bleeding include an age of 74 years or older.  The bleeding index is 'intermediate risk'.  Positive CHADS2 values include History of CHF and History of HTN.  Negative CHADS2 values include Age > 19 years old.  Prothrombin time is 20.7.  Anticoagulation responsible provider: Graciela Husbands MD, Viviann Spare.  INR POC: 2.1.    Anticoagulation Management Assessment/Plan:      The target INR is 2.0-3.0.  The next INR is due 10/11/2009.  Anticoagulation instructions were given to patient.  Results were reviewed/authorized by Bethena Midget, RN, BSN.  She was notified by Bethena Midget, RN, BSN.         Prior Anticoagulation Instructions: INR 2.9 Change dose to 2.5mg s daily except 5mg s TT&Sun. Order sent to [facility] to recheck INR on 10/02/09.   Current Anticoagulation Instructions: INR 2.1 Continue 2.5mg s daily except 5mg s on MWF. Recheck on 10/11/09. Orders sent to [facility].

## 2010-10-02 NOTE — Letter (Signed)
Summary: Brianna Hanson Medication Issue Order Physician Response Required  Brianna Hanson Medication Issue Order Physician Response Required   Imported By: Roderic Ovens 01/01/2010 16:43:46  _____________________________________________________________________  External Attachment:    Type:   Image     Comment:   External Document

## 2010-10-02 NOTE — Letter (Signed)
Summary: DEXA SCAN  DEXA SCAN   Imported By: Arta Bruce 09/24/2007 15:57:07  _____________________________________________________________________  External Attachment:    Type:   Image     Comment:   External Document

## 2010-10-02 NOTE — Letter (Signed)
Summary: [facility]   Imported By: Arta Bruce 10/12/2008 15:31:35  _____________________________________________________________________  External Attachment:    Type:   Image     Comment:   External Document

## 2010-10-02 NOTE — Progress Notes (Signed)
  Phone Note Outgoing Call   Summary of Call: Please find out if patient is still our patient. Not seen in a year.  She has significant cardiac issues and is followed at [facility] cardiology and [facility] in [place].  Initial call taken by: Tereso Newcomer PA-C,  October 13, 2009 4:45 PM  Follow-up for Phone Call        Left message on answering machine for pt to call back....Marland KitchenMarland KitchenArmenia Shannon  October 16, 2009 12:45 PM   Additional Follow-up for Phone Call Additional follow up Details #1::        pt is with in our care so i asked pt to make an appt to be seen Additional Follow-up by: [place] Shannon,  October 16, 2009 3:40 PM

## 2010-10-02 NOTE — Medication Information (Signed)
Summary: Coumadin Clinic  Anticoagulant Therapy  Managed by: Bethena Midget, RN, BSN Referring MD: Johney Frame MD, Shann Medal MD: Antoine Poche MD, Fayrene Fearing Indication 1: Atrial Fibrillation Lab Used: [facility] Site: [address] INR POC 2.9 INR RANGE 2.0-3.0  Dietary changes: no    Health status changes: no    Bleeding/hemorrhagic complications: no    Recent/future hospitalizations: no    Any changes in medication regimen? no    Recent/future dental: no  Any missed doses?: no       Is patient compliant with meds? yes       Allergies: No Known Drug Allergies  Anticoagulation Management History:      Her anticoagulation is being managed by telephone today.  Positive risk factors for bleeding include an age of 56 years or older.  The bleeding index is 'intermediate risk'.  Positive CHADS2 values include History of CHF and History of HTN.  Negative CHADS2 values include Age > 65 years old.  Anticoagulation responsible provider: Antoine Poche MD, Fayrene Fearing.  INR POC: 2.9.    Anticoagulation Management Assessment/Plan:      The target INR is 2.0-3.0.  The next INR is due 10/02/2009.  Anticoagulation instructions were given to patient.  Results were reviewed/authorized by Bethena Midget, RN, BSN.  She was notified by Bethena Midget, RN, BSN.         Prior Anticoagulation Instructions: INR 1.7  Called spoke with pt.  Advised to incr dosage to 5mg  (2tabs) daily except 2.5mg  (1 tab) on MWF. Recheck on 09/25/09.  Faxed order to [place] with coumadin instructions and PT/INR redraw orders for 09/25/09.    Current Anticoagulation Instructions: INR 2.9 Change dose to 2.5mg s daily except 5mg s TT&Sun. Order sent to [facility] to recheck INR on 10/02/09.

## 2010-10-02 NOTE — Letter (Signed)
Summary: OUT PATIENT CHEMISTRY  OUT PATIENT CHEMISTRY   Imported By: Arta Bruce 12/26/2009 14:52:01  _____________________________________________________________________  External Attachment:    Type:   Image     Comment:   External Document

## 2010-10-02 NOTE — Letter (Signed)
Summary: Remote Device Check  [employer], Main Office  [address]   [place], [place] [address]   Phone: [phone]  Fax: [phone]     May 17, 2010 MRN: [id number]   [facility] [place] [address] [place], [place]  [address]   Dear Ms. Manske,   Your remote transmission was recieved and reviewed by your physician.  All diagnostics were within normal limits for you.   __X____Your next office visit is scheduled for:  06-01-10 @ 1200 with Dr Johney Frame.    Sincerely,  Vella Kohler

## 2010-10-02 NOTE — Medication Information (Signed)
Summary: rov/cb  Anticoagulant Therapy  Managed by: Weston Brass, PharmD Referring MD: Johney Frame MD, Shann Medal MD: Juanda Chance MD, Euva Rundell Indication 1: Atrial Fibrillation Lab Used: [facility] [place] Site: [address] INR POC 1.7 INR RANGE 2.0-3.0  Dietary changes: no    Health status changes: no    Bleeding/hemorrhagic complications: no    Recent/future hospitalizations: no    Any changes in medication regimen? no    Recent/future dental: no  Any missed doses?: no       Is patient compliant with meds? yes       Allergies: No Known Drug Allergies  Anticoagulation Management History:      The patient is taking warfarin and comes in today for a routine follow up visit.  Positive risk factors for bleeding include an age of 74 years or older.  The bleeding index is 'intermediate risk'.  Positive CHADS2 values include History of CHF and History of HTN.  Negative CHADS2 values include Age > 99 years old.  Anticoagulation responsible provider: Juanda Chance MD, Smitty Cords.  INR POC: 1.7.  Cuvette Lot#: [id number].  Exp: 04/2011.    Anticoagulation Management Assessment/Plan:      The target INR is 2.0-3.0.  The next INR is due 03/02/2010.  Anticoagulation instructions were given to patient.  Results were reviewed/authorized by Weston Brass, PharmD.  She was notified by Weston Brass PharmD.         Prior Anticoagulation Instructions: INR 1.9. Take 2 tablets daily except 1 tablet on Tues, Thurs, Sun. Recheck in 2 weeks.  Current Anticoagulation Instructions: INR 1.7  Take 2 1/2 tablets today then increase dose to 2 tablets every day except 1 tablet on Sunday and Thursday.

## 2010-10-02 NOTE — Medication Information (Signed)
Summary: rov/eac  Anticoagulant Therapy  Managed by: Elaina Pattee, PharmD Referring MD: Johney Frame MD, Shann Medal MD: Excell Seltzer MD, Casimiro Needle Indication 1: Atrial Fibrillation Lab Used: [facility] [place] Site: [address] INR POC 1.9 INR RANGE 2.0-3.0  Dietary changes: no    Health status changes: no    Bleeding/hemorrhagic complications: no    Recent/future hospitalizations: no    Any changes in medication regimen? no    Recent/future dental: no  Any missed doses?: no       Is patient compliant with meds? yes       Allergies: No Known Drug Allergies  Anticoagulation Management History:      The patient is taking warfarin and comes in today for a routine follow up visit.  Positive risk factors for bleeding include an age of 54 years or older.  The bleeding index is 'intermediate risk'.  Positive CHADS2 values include History of CHF and History of HTN.  Negative CHADS2 values include Age > 59 years old.  Anticoagulation responsible provider: Excell Seltzer MD, Casimiro Needle.  INR POC: 1.9.  Cuvette Lot#: [id number].  Exp: 04/2011.    Anticoagulation Management Assessment/Plan:      The target INR is 2.0-3.0.  The next INR is due 02/16/2010.  Anticoagulation instructions were given to patient.  Results were reviewed/authorized by Elaina Pattee, PharmD.  She was notified by Elaina Pattee, PharmD.         Prior Anticoagulation Instructions: INR 1.9  Take 2 tablets today and tomorrow.  Then return to normal dosing schedule of 2 tablets on Monday, Wednesday, and Friday and 1 tablet all other days.  Return to clinic in 2 weeks.    Current Anticoagulation Instructions: INR 1.9. Take 2 tablets daily except 1 tablet on Tues, Thurs, Sun. Recheck in 2 weeks.

## 2010-10-02 NOTE — Medication Information (Signed)
Summary: rov/eac  Anticoagulant Therapy  Managed by: Cloyde Reams, RN, BSN Referring MD: Johney Frame MD, Shann Medal MD: Gala Romney MD, Reuel Boom Indication 1: Atrial Fibrillation Lab Used: [facility] [place] Site: [address] INR POC 2.0 INR RANGE 2.0-3.0  Dietary changes: no    Health status changes: no    Bleeding/hemorrhagic complications: yes       Details: 1 episode of BRB from rectum, not following a bowel movement.  Advised pt to contact GI MD at [facility] and make aware of incident and arrange f/u.    Recent/future hospitalizations: no    Any changes in medication regimen? no    Recent/future dental: no  Any missed doses?: no       Is patient compliant with meds? yes       Allergies (verified): No Known Drug Allergies  Anticoagulation Management History:      The patient is taking warfarin and comes in today for a routine follow up visit.  Positive risk factors for bleeding include an age of 30 years or older.  The bleeding index is 'intermediate risk'.  Positive CHADS2 values include History of CHF and History of HTN.  Negative CHADS2 values include Age > 59 years old.  Anticoagulation responsible provider: Hendrick Pavich MD, Reuel Boom.  INR POC: 2.0.  Cuvette Lot#: [id number].  Exp: 01/2011.    Anticoagulation Management Assessment/Plan:      The target INR is 2.0-3.0.  The next INR is due 01/11/2010.  Anticoagulation instructions were given to patient.  Results were reviewed/authorized by Cloyde Reams, RN, BSN.  She was notified by Cloyde Reams RN.         Prior Anticoagulation Instructions: INR 2.3  Continue taking 2 tablets on Monday, Wednesday, and Friday and take 1 tablet all other days.  Return to clinic in 4 weeks.    Current Anticoagulation Instructions: INR 2.0  Continue on same dosage 1 tablet daily except 2 tablets on Mondays, Wednesdays, and Fridays.  Recheck in 4 weeks.

## 2010-10-02 NOTE — Letter (Signed)
Summary: [facility]   Imported By: Arta Bruce 09/24/2007 15:50:15  _____________________________________________________________________  External Attachment:    Type:   Image     Comment:   External Document

## 2010-10-02 NOTE — Medication Information (Signed)
Summary: Coumadin Clinic  Anticoagulant Therapy  Managed by: Bethena Midget, RN, BSN Referring MD: Johney Frame MD, Shann Medal MD: Jens Som MD, Arlys John Indication 1: Atrial Fibrillation Lab Used: [facility] Site: [address] PT 23.9 INR POC 2.4 INR RANGE 2.0-3.0  Dietary changes: no    Health status changes: no    Bleeding/hemorrhagic complications: no    Recent/future hospitalizations: no    Any changes in medication regimen? no    Recent/future dental: no  Any missed doses?: no       Is patient compliant with meds? yes       Allergies: No Known Drug Allergies  Anticoagulation Management History:      Her anticoagulation is being managed by telephone today.  Positive risk factors for bleeding include an age of 74 years or older.  The bleeding index is 'intermediate risk'.  Positive CHADS2 values include History of CHF and History of HTN.  Negative CHADS2 values include Age > 74 years old.  Prothrombin time is 23.9.  Anticoagulation responsible provider: Jens Som MD, Arlys John.  INR POC: 2.4.    Anticoagulation Management Assessment/Plan:      The target INR is 2.0-3.0.  The next INR is due 10/25/2009.  Anticoagulation instructions were given to patient.  Results were reviewed/authorized by Bethena Midget, RN, BSN.  She was notified by Bethena Midget, RN, BSN.         Prior Anticoagulation Instructions: INR 2.1 Continue 2.5mg s daily except 5mg s on MWF. Recheck on 10/11/09. Orders sent to [facility].   Current Anticoagulation Instructions: INR 2.4 Continue 2.5mg s daily except 5mg s on MWF. Recheck in 2 weeks.

## 2010-10-04 NOTE — Letter (Signed)
Summary: Remote Device Check  [facility] [employer], Main Office  [address]   [place], [place] [address]   Phone: [phone]  Fax: [phone]     September 12, 2010 MRN: [id number]   [facility] [place] [address] [place], [place]  [address]   Dear Ms. Schellinger,   Your remote transmission was recieved and reviewed by your physician.  All diagnostics were within normal limits for you.   __X____Your next office visit is scheduled for:  11-30-10 @ 930 with Dr Johney Frame.                               Sincerely,  Vella Kohler

## 2010-10-04 NOTE — Letter (Signed)
Summary: Remote Device Check  [facility], Main Office  [address]   [place], [place] [address]   Phone: [phone]  Fax: [phone]     September 12, 2010 MRN: [id number]   [facility] [address] [place], [place]  [address]   Dear Ms. Sorbello,   Your remote transmission was recieved and reviewed by your physician.  All diagnostics were within normal limits for you.  _____Your next transmission is scheduled for:                       .  Please transmit at any time this day.  If you have a wireless device your transmission will be sent automatically.  ______Your next office visit is scheduled for:                              . Please call our office to schedule an appointment.    Sincerely,  Vella Kohler

## 2010-10-04 NOTE — Progress Notes (Signed)
Summary: refill  Phone Note Refill Request Message from:  Patient on September 17, 2010 3:27 PM  Refills Requested: Medication #1:  FUROSEMIDE 40 MG TABS take 2 tablets in the morning and 1 tablet in the evening [facility] [address] [phone]  Initial call taken by: Judie Grieve,  September 17, 2010 3:27 PM    Prescriptions: FUROSEMIDE 40 MG TABS (FUROSEMIDE) take 2 tablets in the morning and 1 tablet in the evening  #90 x 5   Entered by:   Laurance Flatten CMA   Authorized by:   Hillis Range, MD   Signed by:   Laurance Flatten CMA on 09/21/2010   Method used:   Electronically to        [facility] [id number] (retail)       [address]       [place], [place]  [address]       Ph: [phone]       Fax: [phone]   RxID:   [id number]

## 2010-10-04 NOTE — Medication Information (Signed)
Summary: rov/sp  Anticoagulant Therapy  Managed by: Bethena Midget, RN, BSN Referring MD: Johney Frame MD, Fayrene Fearing PCP: None Supervising MD: Jens Som MD, Arlys John Indication 1: Atrial Fibrillation Lab Used: [facility] [place] Site: [address] INR POC 3.0 INR RANGE 2.0-3.0  Dietary changes: no    Health status changes: no    Bleeding/hemorrhagic complications: no    Recent/future hospitalizations: no    Any changes in medication regimen? no    Recent/future dental: no  Any missed doses?: no       Is patient compliant with meds? yes       Allergies: No Known Drug Allergies  Anticoagulation Management History:      The patient is taking warfarin and comes in today for a routine follow up visit.  Positive risk factors for bleeding include an age of 74 years or older.  The bleeding index is 'intermediate risk'.  Positive CHADS2 values include History of CHF and History of HTN.  Negative CHADS2 values include Age > 74 years old.  Anticoagulation responsible provider: Jens Som MD, Arlys John.  INR POC: 3.0.  Cuvette Lot#: [id number].  Exp: 04/2012.    Anticoagulation Management Assessment/Plan:      The target INR is 2.0-3.0.  The next INR is due 10/05/2010.  Anticoagulation instructions were given to patient.  Results were reviewed/authorized by Bethena Midget, RN, BSN.  She was notified by Bethena Midget, RN, BSN.         Prior Anticoagulation Instructions: INR 2.2  Continue same dose of 1 tablet every day except 2 tablets on Tuesday and Friday.  Recheck INR in 3 weeks.   Current Anticoagulation Instructions: INR 3.0 Continue 1 pill everyday except 2 pills on Tuesdays and Fridays. Recheck in 4 weeks.

## 2010-10-04 NOTE — Cardiovascular Report (Signed)
Summary: Office Visit Remote   Office Visit Remote   Imported By: Roderic Ovens 09/14/2010 10:48:49  _____________________________________________________________________  External Attachment:    Type:   Image     Comment:   External Document

## 2010-10-04 NOTE — Medication Information (Signed)
Summary: rov/ej  Anticoagulant Therapy  Managed by: Weston Brass, PharmD Referring MD: Johney Frame MD, Fayrene Fearing PCP: None Supervising MD: Gala Romney MD, Reuel Boom Indication 1: Atrial Fibrillation Lab Used: [facility] [place] Site: [address] INR POC 2.2 INR RANGE 2.0-3.0  Dietary changes: yes       Details: decreased greens  Health status changes: no    Bleeding/hemorrhagic complications: yes       Details: some rectal bleeding after bowel movement but this is normal for pt  Recent/future hospitalizations: no    Any changes in medication regimen? no    Recent/future dental: no  Any missed doses?: no       Is patient compliant with meds? yes       Allergies: No Known Drug Allergies  Anticoagulation Management History:      The patient is taking warfarin and comes in today for a routine follow up visit.  Positive risk factors for bleeding include an age of 74 years or older.  The bleeding index is 'intermediate risk'.  Positive CHADS2 values include History of CHF and History of HTN.  Negative CHADS2 values include Age > 30 years old.  Anticoagulation responsible provider: [name] MD, Reuel Boom.  INR POC: 2.2.  Cuvette Lot#: [id number].  Exp: 10/2011.    Anticoagulation Management Assessment/Plan:      The target INR is 2.0-3.0.  The next INR is due 09/07/2010.  Anticoagulation instructions were given to patient.  Results were reviewed/authorized by Weston Brass, PharmD.  She was notified by Weston Brass PharmD.         Prior Anticoagulation Instructions: Take 1 tablet (2.5 mg) daily except for 2 tablets (5 mg) on Tuesdays and Fridays.  INR=  Current Anticoagulation Instructions: INR 2.2  Continue same dose of 1 tablet every day except 2 tablets on Tuesday and Friday.  Recheck INR in 3 weeks.

## 2010-10-05 ENCOUNTER — Encounter: Payer: Self-pay | Admitting: Internal Medicine

## 2010-10-05 ENCOUNTER — Ambulatory Visit: Admit: 2010-10-05 | Payer: Self-pay

## 2010-10-05 ENCOUNTER — Encounter (INDEPENDENT_AMBULATORY_CARE_PROVIDER_SITE_OTHER): Payer: Medicare Other

## 2010-10-05 DIAGNOSIS — Z7901 Long term (current) use of anticoagulants: Secondary | ICD-10-CM

## 2010-10-05 DIAGNOSIS — I4891 Unspecified atrial fibrillation: Secondary | ICD-10-CM

## 2010-10-05 LAB — CONVERTED CEMR LAB: POC INR: 3.3

## 2010-10-10 NOTE — Medication Information (Signed)
Summary: rov/ewj  Anticoagulant Therapy  Managed by: Windell Hummingbird, RN Referring MD: Johney Frame MD, Fayrene Fearing PCP: None Supervising MD: Tenny Craw MD, Gunnar Fusi Indication 1: Atrial Fibrillation Lab Used: [facility] [place] Site: [address] INR POC 3.3 INR RANGE 2.0-3.0  Dietary changes: yes       Details: Eating less greens  Health status changes: no    Bleeding/hemorrhagic complications: yes       Details: Has noticed a little bright red blood in stools  Recent/future hospitalizations: no    Any changes in medication regimen? no    Recent/future dental: no  Any missed doses?: no       Is patient compliant with meds? yes       Allergies: No Known Drug Allergies  Anticoagulation Management History:      The patient is taking warfarin and comes in today for a routine follow up visit.  Positive risk factors for bleeding include an age of 74 years or older.  The bleeding index is 'intermediate risk'.  Positive CHADS2 values include History of CHF and History of HTN.  Negative CHADS2 values include Age > 66 years old.  Anticoagulation responsible provider: Tenny Craw MD, Gunnar Fusi.  INR POC: 3.3.  Cuvette Lot#: [id number].  Exp: 09/2011.    Anticoagulation Management Assessment/Plan:      [name] current anticoagulation dose is Warfarin sodium 2.5 mg tabs: Use as directed by Anticoagualtion Clinic.  The target INR is 2.0-3.0.  The next INR is due 10/26/2010.  Anticoagulation instructions were given to patient.  Results were reviewed/authorized by Windell Hummingbird, RN.  She was notified by Windell Hummingbird, RN.         Prior Anticoagulation Instructions: INR 3.0 Continue 1 pill everyday except 2 pills on Tuesdays and Fridays. Recheck in 4 weeks.   Current Anticoagulation Instructions: INR 3.3 Only 1tablet today (Friday), then resume taking 1 tablet every day, except 2 tablets on Tuesdays and Fridays. Recheck in 3 weeks.

## 2010-10-26 ENCOUNTER — Encounter: Payer: Self-pay | Admitting: Cardiology

## 2010-10-26 ENCOUNTER — Encounter (INDEPENDENT_AMBULATORY_CARE_PROVIDER_SITE_OTHER): Payer: Medicare Other

## 2010-10-26 DIAGNOSIS — Z7901 Long term (current) use of anticoagulants: Secondary | ICD-10-CM

## 2010-10-26 DIAGNOSIS — I4891 Unspecified atrial fibrillation: Secondary | ICD-10-CM

## 2010-10-26 LAB — CONVERTED CEMR LAB: POC INR: 2.7

## 2010-10-30 NOTE — Medication Information (Signed)
Summary: rov/pc  Anticoagulant Therapy  Managed by: Weston Brass, PharmD Referring MD: Johney Frame MD, Fayrene Fearing PCP: None Supervising MD: Jens Som MD, Arlys John Indication 1: Atrial Fibrillation Lab Used: [facility] [place] Site: [address] INR POC 2.7 INR RANGE 2.0-3.0  Dietary changes: no    Health status changes: no    Bleeding/hemorrhagic complications: yes       Details: Bright red blood in stool.  Happens everytime she goes but it is not painful.  It has been going on since November when her Coumadin dose was increased.  The bleeding has become more frequent since then.    Recent/future hospitalizations: no    Any changes in medication regimen? no    Recent/future dental: no  Any missed doses?: no       Is patient compliant with meds? yes      Comments: Patient was givent the phone number to [facility]. and instructed to make an appointment with them to follow up about the blood in her stool.    Allergies: No Known Drug Allergies  Anticoagulation Management History:      The patient is taking warfarin and comes in today for a routine follow up visit.  Positive risk factors for bleeding include an age of 74 years or older.  The bleeding index is 'intermediate risk'.  Positive CHADS2 values include History of CHF and History of HTN.  Negative CHADS2 values include Age > 74 years old.  Anticoagulation responsible provider: Jens Som MD, Arlys John.  INR POC: 2.7.  Cuvette Lot#: [id number].  Exp: 10/2011.    Anticoagulation Management Assessment/Plan:      [name] current anticoagulation dose is Warfarin sodium 2.5 mg tabs: Use as directed by Anticoagualtion Clinic.  The target INR is 2.0-3.0.  The next INR is due 11/16/2010.  Anticoagulation instructions were given to patient.  Results were reviewed/authorized by Weston Brass, PharmD.  She was notified by Margot Chimes PharmD Candidate.         Prior Anticoagulation Instructions: INR 3.3 Only  1tablet today (Friday), then resume taking 1 tablet every day, except 2 tablets on Tuesdays and Fridays. Recheck in 3 weeks.  Current Anticoagulation Instructions: INR 2.7  Continue to take 1 tablet daily except for Tuesdays and Fridays when you take 2 tablets.  Recheck INR in 3 weeks.

## 2010-11-15 ENCOUNTER — Encounter (INDEPENDENT_AMBULATORY_CARE_PROVIDER_SITE_OTHER): Payer: Medicare Other | Admitting: Internal Medicine

## 2010-11-15 ENCOUNTER — Encounter: Payer: Self-pay | Admitting: Internal Medicine

## 2010-11-15 ENCOUNTER — Other Ambulatory Visit: Payer: Self-pay | Admitting: Internal Medicine

## 2010-11-15 DIAGNOSIS — I509 Heart failure, unspecified: Secondary | ICD-10-CM

## 2010-11-15 DIAGNOSIS — I4891 Unspecified atrial fibrillation: Secondary | ICD-10-CM

## 2010-11-15 DIAGNOSIS — I4901 Ventricular fibrillation: Secondary | ICD-10-CM

## 2010-11-15 DIAGNOSIS — I428 Other cardiomyopathies: Secondary | ICD-10-CM

## 2010-11-15 DIAGNOSIS — I10 Essential (primary) hypertension: Secondary | ICD-10-CM

## 2010-11-15 DIAGNOSIS — I4892 Unspecified atrial flutter: Secondary | ICD-10-CM

## 2010-11-15 DIAGNOSIS — I5022 Chronic systolic (congestive) heart failure: Secondary | ICD-10-CM

## 2010-11-15 LAB — BASIC METABOLIC PANEL
BUN: 37 mg/dL — ABNORMAL HIGH (ref 6–23)
BUN: 18 mg/dL (ref 6–23)
BUN: 19 mg/dL (ref 6–23)
BUN: 20 mg/dL (ref 6–23)
CO2: 25 mEq/L (ref 19–32)
CO2: 21 mEq/L (ref 19–32)
CO2: 23 mEq/L (ref 19–32)
CO2: 23 mEq/L (ref 19–32)
Calcium: 9 mg/dL (ref 8.4–10.5)
Calcium: 8.6 mg/dL (ref 8.4–10.5)
Calcium: 9.2 mg/dL (ref 8.4–10.5)
Calcium: 9.3 mg/dL (ref 8.4–10.5)
Chloride: 106 mEq/L (ref 96–112)
Chloride: 106 mEq/L (ref 96–112)
Chloride: 107 mEq/L (ref 96–112)
Chloride: 107 mEq/L (ref 96–112)
Creatinine, Ser: 1.5 mg/dL — ABNORMAL HIGH (ref 0.4–1.2)
Creatinine, Ser: 0.83 mg/dL (ref 0.4–1.2)
Creatinine, Ser: 0.83 mg/dL (ref 0.4–1.2)
Creatinine, Ser: 1.23 mg/dL — ABNORMAL HIGH (ref 0.4–1.2)
GFR calc Af Amer: 52 mL/min — ABNORMAL LOW (ref >60)
GFR calc Af Amer: >60 mL/min (ref >60)
GFR calc Af Amer: >60 mL/min (ref >60)
GFR calc non Af Amer: 43 mL/min — ABNORMAL LOW (ref >60)
GFR calc non Af Amer: >60 mL/min (ref >60)
GFR calc non Af Amer: >60 mL/min (ref >60)
GFR: 42.68 mL/min — ABNORMAL LOW (ref >60.00)
Glucose, Bld: 81 mg/dL (ref 70–99)
Glucose, Bld: 129 mg/dL — ABNORMAL HIGH (ref 70–99)
Glucose, Bld: 76 mg/dL (ref 70–99)
Glucose, Bld: 85 mg/dL (ref 70–99)
Potassium: 4.9 mEq/L (ref 3.5–5.1)
Potassium: 3.9 mEq/L (ref 3.5–5.1)
Potassium: 4 mEq/L (ref 3.5–5.1)
Potassium: 4.1 mEq/L (ref 3.5–5.1)
Sodium: 137 mEq/L (ref 135–145)
Sodium: 136 mEq/L (ref 135–145)
Sodium: 138 mEq/L (ref 135–145)
Sodium: 138 mEq/L (ref 135–145)

## 2010-11-15 LAB — DIFFERENTIAL
Basophils Absolute: 0 10*3/uL (ref 0.0–0.1)
Basophils Relative: 0 % (ref 0–1)
Eosinophils Absolute: 0.2 10*3/uL (ref 0.0–0.7)
Eosinophils Relative: 3 % (ref 0–5)
Lymphocytes Relative: 29 % (ref 12–46)
Lymphs Abs: 1.9 10*3/uL (ref 0.7–4.0)
Monocytes Absolute: 0.5 10*3/uL (ref 0.1–1.0)
Monocytes Relative: 7 % (ref 3–12)
Neutro Abs: 3.9 10*3/uL (ref 1.7–7.7)
Neutrophils Relative %: 61 % (ref 43–77)

## 2010-11-15 LAB — PROTIME-INR
INR: 2.41 — ABNORMAL HIGH (ref 0.00–1.49)
INR: 2.45 — ABNORMAL HIGH (ref 0.00–1.49)
INR: 2.62 — ABNORMAL HIGH (ref 0.00–1.49)
INR: 2.69 — ABNORMAL HIGH (ref 0.00–1.49)
Prothrombin Time: 26.4 seconds — ABNORMAL HIGH (ref 11.6–15.2)
Prothrombin Time: 26.7 seconds — ABNORMAL HIGH (ref 11.6–15.2)
Prothrombin Time: 28.1 seconds — ABNORMAL HIGH (ref 11.6–15.2)
Prothrombin Time: 28.7 seconds — ABNORMAL HIGH (ref 11.6–15.2)

## 2010-11-15 LAB — URINALYSIS, ROUTINE W REFLEX MICROSCOPIC
Bilirubin Urine: NEGATIVE
Glucose, UA: NEGATIVE mg/dL
Hgb urine dipstick: NEGATIVE
Ketones, ur: NEGATIVE mg/dL
Nitrite: NEGATIVE
Protein, ur: NEGATIVE mg/dL
Specific Gravity, Urine: 1.01 (ref 1.005–1.030)
Urobilinogen, UA: 0.2 mg/dL (ref 0.0–1.0)
pH: 6 (ref 5.0–8.0)

## 2010-11-15 LAB — CBC
HCT: 38.1 % (ref 36.0–46.0)
Hemoglobin: 13 g/dL (ref 12.0–15.0)
MCH: 30.2 pg (ref 26.0–34.0)
MCHC: 34.1 g/dL (ref 30.0–36.0)
MCV: 88.6 fL (ref 78.0–100.0)
Platelets: 224 10*3/uL (ref 150–400)
RBC: 4.3 MIL/uL (ref 3.87–5.11)
RDW: 15.8 % — ABNORMAL HIGH (ref 11.5–15.5)
WBC: 6.5 10*3/uL (ref 4.0–10.5)

## 2010-11-15 LAB — POCT CARDIAC MARKERS
CKMB, poc: <1 ng/mL — ABNORMAL LOW (ref 1.0–8.0)
Myoglobin, poc: 66.9 ng/mL (ref 12–200)
Troponin i, poc: <0.05 ng/mL (ref 0.00–0.09)

## 2010-11-15 LAB — HEPATIC FUNCTION PANEL
ALT: 23 U/L (ref 0–35)
AST: 21 U/L (ref 0–37)
Albumin: 3.5 g/dL (ref 3.5–5.2)
Alkaline Phosphatase: 98 U/L (ref 39–117)
Bilirubin, Direct: 0.1 mg/dL (ref 0.0–0.3)
Total Bilirubin: 0.3 mg/dL (ref 0.3–1.2)
Total Protein: 7.3 g/dL (ref 6.0–8.3)

## 2010-11-15 LAB — T4, FREE: Free T4: 1.07 ng/dL (ref 0.60–1.60)

## 2010-11-15 LAB — URINE MICROSCOPIC-ADD ON

## 2010-11-15 LAB — CARDIAC PANEL(CRET KIN+CKTOT+MB+TROPI)
CK, MB: 1 ng/mL (ref 0.3–4.0)
CK, MB: 1 ng/mL (ref 0.3–4.0)
Relative Index: INVALID (ref 0.0–2.5)
Relative Index: INVALID (ref 0.0–2.5)
Total CK: 38 U/L (ref 7–177)
Total CK: 39 U/L (ref 7–177)
Troponin I: 0.01 ng/mL (ref 0.00–0.06)
Troponin I: 0.06 ng/mL (ref 0.00–0.06)

## 2010-11-15 LAB — LIPID PANEL
Cholesterol: 130 mg/dL (ref 0–200)
HDL: 36 mg/dL — ABNORMAL LOW (ref >39)
LDL Cholesterol: 76 mg/dL (ref 0–99)
Total CHOL/HDL Ratio: 3.6 RATIO
Triglycerides: 90 mg/dL (ref <150)
VLDL: 18 mg/dL (ref 0–40)

## 2010-11-15 LAB — CK TOTAL AND CKMB (NOT AT ARMC)
CK, MB: 0.9 ng/mL (ref 0.3–4.0)
Relative Index: INVALID (ref 0.0–2.5)
Total CK: 47 U/L (ref 7–177)

## 2010-11-15 LAB — SAMPLE TO BLOOD BANK

## 2010-11-15 LAB — APTT: aPTT: 46 seconds — ABNORMAL HIGH (ref 24–37)

## 2010-11-15 LAB — MAGNESIUM: Magnesium: 2.1 mg/dL (ref 1.5–2.5)

## 2010-11-15 LAB — TSH: TSH: 2.01 u[IU]/mL (ref 0.35–5.50)

## 2010-11-15 LAB — TROPONIN I: Troponin I: 0.04 ng/mL (ref 0.00–0.06)

## 2010-11-16 ENCOUNTER — Other Ambulatory Visit: Payer: Medicare Other

## 2010-11-16 ENCOUNTER — Other Ambulatory Visit: Payer: Self-pay | Admitting: Internal Medicine

## 2010-11-16 ENCOUNTER — Encounter (INDEPENDENT_AMBULATORY_CARE_PROVIDER_SITE_OTHER): Payer: Medicare Other

## 2010-11-16 ENCOUNTER — Ambulatory Visit (INDEPENDENT_AMBULATORY_CARE_PROVIDER_SITE_OTHER): Payer: Medicare Other | Admitting: Internal Medicine

## 2010-11-16 ENCOUNTER — Encounter: Payer: Self-pay | Admitting: Internal Medicine

## 2010-11-16 ENCOUNTER — Encounter: Payer: Self-pay | Admitting: Cardiology

## 2010-11-16 DIAGNOSIS — I4891 Unspecified atrial fibrillation: Secondary | ICD-10-CM

## 2010-11-16 DIAGNOSIS — D649 Anemia, unspecified: Secondary | ICD-10-CM

## 2010-11-16 DIAGNOSIS — I4892 Unspecified atrial flutter: Secondary | ICD-10-CM

## 2010-11-16 DIAGNOSIS — Z8679 Personal history of other diseases of the circulatory system: Secondary | ICD-10-CM

## 2010-11-16 DIAGNOSIS — I10 Essential (primary) hypertension: Secondary | ICD-10-CM

## 2010-11-16 DIAGNOSIS — K921 Melena: Secondary | ICD-10-CM

## 2010-11-16 DIAGNOSIS — Z79899 Other long term (current) drug therapy: Secondary | ICD-10-CM

## 2010-11-16 DIAGNOSIS — Z7901 Long term (current) use of anticoagulants: Secondary | ICD-10-CM

## 2010-11-16 LAB — CONVERTED CEMR LAB: POC INR: 3.9

## 2010-11-16 LAB — CBC WITH DIFFERENTIAL/PLATELET
Basophils Absolute: 0 10*3/uL (ref 0.0–0.1)
Basophils Absolute: 0.1 10*3/uL (ref 0.0–0.1)
Basophils Relative: 0.3 % (ref 0.0–3.0)
Basophils Relative: 0.6 % (ref 0.0–3.0)
Eosinophils Absolute: 0.2 10*3/uL (ref 0.0–0.7)
Eosinophils Absolute: 0.2 10*3/uL (ref 0.0–0.7)
Eosinophils Relative: 1.7 % (ref 0.0–5.0)
Eosinophils Relative: 2.2 % (ref 0.0–5.0)
HCT: 30 % — ABNORMAL LOW (ref 36.0–46.0)
HCT: 31.3 % — ABNORMAL LOW (ref 36.0–46.0)
Hemoglobin: 10.3 g/dL — ABNORMAL LOW (ref 12.0–15.0)
Hemoglobin: 10.7 g/dL — ABNORMAL LOW (ref 12.0–15.0)
Lymphocytes Relative: 21.1 % (ref 12.0–46.0)
Lymphocytes Relative: 22.1 % (ref 12.0–46.0)
Lymphs Abs: 2 10*3/uL (ref 0.7–4.0)
Lymphs Abs: 2.1 10*3/uL (ref 0.7–4.0)
MCHC: 34.3 g/dL (ref 30.0–36.0)
MCHC: 34.2 g/dL (ref 30.0–36.0)
MCV: 92.2 fl (ref 78.0–100.0)
MCV: 92.8 fl (ref 78.0–100.0)
Monocytes Absolute: 0.9 10*3/uL (ref 0.1–1.0)
Monocytes Absolute: 0.9 10*3/uL (ref 0.1–1.0)
Monocytes Relative: 9.2 % (ref 3.0–12.0)
Monocytes Relative: 9.8 % (ref 3.0–12.0)
Neutro Abs: 6.3 10*3/uL (ref 1.4–7.7)
Neutro Abs: 6.1 10*3/uL (ref 1.4–7.7)
Neutrophils Relative %: 67.7 % (ref 43.0–77.0)
Neutrophils Relative %: 65.3 % (ref 43.0–77.0)
Platelets: 210 10*3/uL (ref 150.0–400.0)
Platelets: 222 10*3/uL (ref 150.0–400.0)
RBC: 3.25 Mil/uL — ABNORMAL LOW (ref 3.87–5.11)
RBC: 3.37 Mil/uL — ABNORMAL LOW (ref 3.87–5.11)
RDW: 14.5 % (ref 11.5–14.6)
RDW: 14.2 % (ref 11.5–14.6)
WBC: 9.3 10*3/uL (ref 4.5–10.5)
WBC: 9.4 10*3/uL (ref 4.5–10.5)

## 2010-11-16 LAB — IBC PANEL
Iron: 45 ug/dL (ref 42–145)
Saturation Ratios: 15.2 % — ABNORMAL LOW (ref 20.0–50.0)
Transferrin: 211.3 mg/dL — ABNORMAL LOW (ref 212.0–360.0)

## 2010-11-16 LAB — B12 AND FOLATE PANEL
Folate: 15.2 ng/mL (ref >5.9)
Vitamin B-12: 450 pg/mL (ref 211–911)

## 2010-11-18 LAB — GLUCOSE, CAPILLARY
Glucose-Capillary: 101 mg/dL — ABNORMAL HIGH (ref 70–99)
Glucose-Capillary: 103 mg/dL — ABNORMAL HIGH (ref 70–99)
Glucose-Capillary: 103 mg/dL — ABNORMAL HIGH (ref 70–99)
Glucose-Capillary: 104 mg/dL — ABNORMAL HIGH (ref 70–99)
Glucose-Capillary: 113 mg/dL — ABNORMAL HIGH (ref 70–99)
Glucose-Capillary: 67 mg/dL — ABNORMAL LOW (ref 70–99)
Glucose-Capillary: 80 mg/dL (ref 70–99)
Glucose-Capillary: 81 mg/dL (ref 70–99)
Glucose-Capillary: 81 mg/dL (ref 70–99)
Glucose-Capillary: 83 mg/dL (ref 70–99)
Glucose-Capillary: 85 mg/dL (ref 70–99)
Glucose-Capillary: 87 mg/dL (ref 70–99)
Glucose-Capillary: 87 mg/dL (ref 70–99)
Glucose-Capillary: 93 mg/dL (ref 70–99)
Glucose-Capillary: 93 mg/dL (ref 70–99)
Glucose-Capillary: 93 mg/dL (ref 70–99)
Glucose-Capillary: 95 mg/dL (ref 70–99)
Glucose-Capillary: 96 mg/dL (ref 70–99)
Glucose-Capillary: 98 mg/dL (ref 70–99)

## 2010-11-18 LAB — CBC
HCT: 26.3 % — ABNORMAL LOW (ref 36.0–46.0)
HCT: 27.4 % — ABNORMAL LOW (ref 36.0–46.0)
HCT: 27.4 % — ABNORMAL LOW (ref 36.0–46.0)
HCT: 27.5 % — ABNORMAL LOW (ref 36.0–46.0)
HCT: 29.5 % — ABNORMAL LOW (ref 36.0–46.0)
Hemoglobin: 8.9 g/dL — ABNORMAL LOW (ref 12.0–15.0)
Hemoglobin: 9.3 g/dL — ABNORMAL LOW (ref 12.0–15.0)
Hemoglobin: 9.3 g/dL — ABNORMAL LOW (ref 12.0–15.0)
Hemoglobin: 9.3 g/dL — ABNORMAL LOW (ref 12.0–15.0)
Hemoglobin: 9.9 g/dL — ABNORMAL LOW (ref 12.0–15.0)
MCHC: 33.7 g/dL (ref 30.0–36.0)
MCHC: 33.8 g/dL (ref 30.0–36.0)
MCHC: 33.8 g/dL (ref 30.0–36.0)
MCHC: 33.9 g/dL (ref 30.0–36.0)
MCHC: 33.9 g/dL (ref 30.0–36.0)
MCV: 87.8 fL (ref 78.0–100.0)
MCV: 88 fL (ref 78.0–100.0)
MCV: 88.3 fL (ref 78.0–100.0)
MCV: 88.8 fL (ref 78.0–100.0)
MCV: 89.2 fL (ref 78.0–100.0)
Platelets: 167 10*3/uL (ref 150–400)
Platelets: 169 10*3/uL (ref 150–400)
Platelets: 171 10*3/uL (ref 150–400)
Platelets: 185 10*3/uL (ref 150–400)
Platelets: 202 10*3/uL (ref 150–400)
RBC: 2.96 MIL/uL — ABNORMAL LOW (ref 3.87–5.11)
RBC: 3.08 MIL/uL — ABNORMAL LOW (ref 3.87–5.11)
RBC: 3.1 MIL/uL — ABNORMAL LOW (ref 3.87–5.11)
RBC: 3.13 MIL/uL — ABNORMAL LOW (ref 3.87–5.11)
RBC: 3.35 MIL/uL — ABNORMAL LOW (ref 3.87–5.11)
RDW: 16.6 % — ABNORMAL HIGH (ref 11.5–15.5)
RDW: 16.8 % — ABNORMAL HIGH (ref 11.5–15.5)
RDW: 17 % — ABNORMAL HIGH (ref 11.5–15.5)
RDW: 17.3 % — ABNORMAL HIGH (ref 11.5–15.5)
RDW: 17.9 % — ABNORMAL HIGH (ref 11.5–15.5)
WBC: 10.6 10*3/uL — ABNORMAL HIGH (ref 4.0–10.5)
WBC: 10.8 10*3/uL — ABNORMAL HIGH (ref 4.0–10.5)
WBC: 8.5 10*3/uL (ref 4.0–10.5)
WBC: 9.1 10*3/uL (ref 4.0–10.5)
WBC: 9.8 10*3/uL (ref 4.0–10.5)

## 2010-11-18 LAB — PROTIME-INR
INR: 1.54 — ABNORMAL HIGH (ref 0.00–1.49)
INR: 1.83 — ABNORMAL HIGH (ref 0.00–1.49)
INR: 2.09 — ABNORMAL HIGH (ref 0.00–1.49)
INR: 2.39 — ABNORMAL HIGH (ref 0.00–1.49)
INR: 2.58 — ABNORMAL HIGH (ref 0.00–1.49)
INR: 2.73 — ABNORMAL HIGH (ref 0.00–1.49)
Prothrombin Time: 18.4 seconds — ABNORMAL HIGH (ref 11.6–15.2)
Prothrombin Time: 21 seconds — ABNORMAL HIGH (ref 11.6–15.2)
Prothrombin Time: 23.3 seconds — ABNORMAL HIGH (ref 11.6–15.2)
Prothrombin Time: 25.9 seconds — ABNORMAL HIGH (ref 11.6–15.2)
Prothrombin Time: 27.5 seconds — ABNORMAL HIGH (ref 11.6–15.2)
Prothrombin Time: 28.7 seconds — ABNORMAL HIGH (ref 11.6–15.2)

## 2010-11-18 LAB — BASIC METABOLIC PANEL
BUN: 2 mg/dL — ABNORMAL LOW (ref 6–23)
BUN: 3 mg/dL — ABNORMAL LOW (ref 6–23)
BUN: 3 mg/dL — ABNORMAL LOW (ref 6–23)
BUN: 3 mg/dL — ABNORMAL LOW (ref 6–23)
CO2: 23 mEq/L (ref 19–32)
CO2: 24 mEq/L (ref 19–32)
CO2: 25 mEq/L (ref 19–32)
CO2: 25 mEq/L (ref 19–32)
Calcium: 8.4 mg/dL (ref 8.4–10.5)
Calcium: 8.5 mg/dL (ref 8.4–10.5)
Calcium: 8.7 mg/dL (ref 8.4–10.5)
Calcium: 8.8 mg/dL (ref 8.4–10.5)
Chloride: 108 mEq/L (ref 96–112)
Chloride: 111 mEq/L (ref 96–112)
Chloride: 112 mEq/L (ref 96–112)
Chloride: 112 mEq/L (ref 96–112)
Creatinine, Ser: 0.77 mg/dL (ref 0.4–1.2)
Creatinine, Ser: 0.85 mg/dL (ref 0.4–1.2)
Creatinine, Ser: 0.86 mg/dL (ref 0.4–1.2)
Creatinine, Ser: 0.9 mg/dL (ref 0.4–1.2)
GFR calc Af Amer: >60 mL/min (ref >60)
GFR calc Af Amer: >60 mL/min (ref >60)
GFR calc Af Amer: >60 mL/min (ref >60)
GFR calc Af Amer: >60 mL/min (ref >60)
GFR calc non Af Amer: >60 mL/min (ref >60)
GFR calc non Af Amer: >60 mL/min (ref >60)
GFR calc non Af Amer: >60 mL/min (ref >60)
GFR calc non Af Amer: >60 mL/min (ref >60)
Glucose, Bld: 84 mg/dL (ref 70–99)
Glucose, Bld: 85 mg/dL (ref 70–99)
Glucose, Bld: 90 mg/dL (ref 70–99)
Glucose, Bld: 96 mg/dL (ref 70–99)
Potassium: 3.4 mEq/L — ABNORMAL LOW (ref 3.5–5.1)
Potassium: 3.7 mEq/L (ref 3.5–5.1)
Potassium: 4 mEq/L (ref 3.5–5.1)
Potassium: 4.3 mEq/L (ref 3.5–5.1)
Sodium: 138 mEq/L (ref 135–145)
Sodium: 141 mEq/L (ref 135–145)
Sodium: 144 mEq/L (ref 135–145)
Sodium: 144 mEq/L (ref 135–145)

## 2010-11-18 LAB — HEPARIN LEVEL (UNFRACTIONATED)
Heparin Unfractionated: 0.29 IU/mL — ABNORMAL LOW (ref 0.30–0.70)
Heparin Unfractionated: 0.43 IU/mL (ref 0.30–0.70)
Heparin Unfractionated: 0.47 IU/mL (ref 0.30–0.70)

## 2010-11-20 NOTE — Medication Information (Signed)
Summary: rov/tm  Anticoagulant Therapy  Managed by: Bethena Midget, RN, BSN Referring MD: Johney Frame MD, Fayrene Fearing PCP: None Supervising MD: Daleen Squibb MD, Maisie Fus Indication 1: Atrial Fibrillation Lab Used: [facility] [place] Site: [address] INR POC 3.9 INR RANGE 2.0-3.0  Dietary changes: no    Health status changes: no    Bleeding/hemorrhagic complications: yes       Details: See PCP today at 1pm in regards to blood in stool   Recent/future hospitalizations: no    Any changes in medication regimen? no    Recent/future dental: no  Any missed doses?: no       Is patient compliant with meds? yes       Allergies: No Known Drug Allergies  Anticoagulation Management History:      The patient is taking warfarin and comes in today for a routine follow up visit.  Positive risk factors for bleeding include an age of 32 years or older.  The bleeding index is 'intermediate risk'.  Positive CHADS2 values include History of CHF and History of HTN.  Negative CHADS2 values include Age > 35 years old.  Anticoagulation responsible provider: Daleen Squibb MD, Maisie Fus.  INR POC: 3.9.  Cuvette Lot#: [id number].  Exp: 11/2011.    Anticoagulation Management Assessment/Plan:      [name] current anticoagulation dose is Warfarin sodium 2.5 mg tabs: Use as directed by Anticoagualtion Clinic.  The target INR is 2.0-3.0.  The next INR is due 11/30/2010.  Anticoagulation instructions were given to patient.  Results were reviewed/authorized by Bethena Midget, RN, BSN.  She was notified by Bethena Midget, RN, BSN.         Prior Anticoagulation Instructions: INR 2.7  Continue to take 1 tablet daily except for Tuesdays and Fridays when you take 2 tablets.  Recheck INR in 3 weeks.    Current Anticoagulation Instructions: INR 3.9 Skip today dose then change dose to 1 pill everyday except 2 pills on Tuesdays. Recheck in 2 weeks.

## 2010-11-20 NOTE — Assessment & Plan Note (Signed)
Summary: new/bcbs/medicare   Vital Signs:  Patient profile:   76 year old female Menstrual status:  hysterectomy Height:      60 inches Weight:      158.75 pounds BMI:     31.12 O2 Sat:      93 % on Room air Temp:     97.8 degrees F oral Pulse rate:   60 / minute Pulse rhythm:   regular Resp:     16 per minute BP sitting:   98 / 62  (left arm) Cuff size:   large  Vitals Entered By: Rock Nephew CMA (November 16, 2010 1:24 PM)  Nutrition Counseling: Patient's BMI is greater than 25 and therefore counseled on weight management options.  O2 Flow:  Room air CC: New to establish,  Is Patient Diabetic? No Pain Assessment Patient in pain? no       Does patient need assistance? Functional Status Self care Ambulation Normal     Menstrual Status hysterectomy Last PAP Result normal   Primary Care Provider:  Etta Grandchild MD  CC:  New to establish and .  History of Present Illness: New to me she was told that she needs a PCP. She has been having some episodes of  painless BRBPR for several months and she had a CBC done 2 days ago and she was mildly anemic. She tells me that Dr. Elnoria Howard told her that her colonoscopy was normal one year ago.  Preventive Screening-Counseling & Management  Alcohol-Tobacco     Alcohol drinks/day: 0     Alcohol Counseling: not indicated; patient does not drink     Smoking Status: never     Tobacco Counseling: not indicated; no tobacco use  Caffeine-Diet-Exercise     Does Patient Exercise: no  Hep-HIV-STD-Contraception     Hepatitis Risk: no risk noted     HIV Risk: no risk noted     STD Risk: no risk noted      Drug Use:  no.        Blood Transfusions:  no.    Clinical Review Panels:  Prevention   Last Mammogram:  normal (11/05/2010)   Last Pap Smear:  normal (09/03/2007)   Last Colonoscopy:  Normal (12/12/2009)  Immunizations   Last Tetanus Booster:  Historical (09/03/2001)   Last Flu Vaccine:  given (09/02/2010)   Last  Pneumovax:  Historical (07/15/2006)  Diabetes Management   Creatinine:  1.5 (06/01/2010)   Last Flu Vaccine:  given (09/02/2010)   Last Pneumovax:  Historical (07/15/2006)  CBC   WBC:  7.1 (11/10/2009)   RBC:  4.22 (11/10/2009)   Hgb:  12.1 (11/10/2009)   Hct:  37.3 (11/10/2009)   Platelets:  204.0 (11/10/2009)   MCV  88.4 (11/10/2009)   MCHC  32.4 (11/10/2009)   RDW  18.6 H % (11/10/2009)   PMN:  57.1 (11/10/2009)   Lymphs:  33.5 (11/10/2009)   Monos:  5.9 (11/10/2009)   Eosinophils:  2.8 (11/10/2009)   Basophil:  0.7 (11/10/2009)  Complete Metabolic Panel   Glucose:  80 (06/01/2010)   Sodium:  137 (06/01/2010)   Potassium:  4.7 (06/01/2010)   Chloride:  104 (06/01/2010)   CO2:  27 (06/01/2010)   BUN:  28 (06/01/2010)   Creatinine:  1.5 (06/01/2010)   Albumin:  3.4 (06/01/2010)   Total Protein:  7.1 (06/01/2010)   Calcium:  8.8 (06/01/2010)   Total Bili:  0.4 (06/01/2010)   Alk Phos:  101 (06/01/2010)   SGPT (ALT):  48 (  06/01/2010)   SGOT (AST):  32 (06/01/2010)   Medications Prior to Update: 1)  Spironolactone 50 Mg Tabs (Spironolactone) .... Take One Tablet Once Daily 2)  Coreg 25 Mg  Tabs (Carvedilol) .... Take One Tablet Two Times A Day 3)  Adult Aspirin Ec Low Strength 81 Mg  Tbec (Aspirin) .... Once Daily 4)  Vitamin D3 1000 Unit Tabs (Cholecalciferol) .... One By Mouth Daily 5)  Furosemide 40 Mg Tabs (Furosemide) .... Take 2 Tablets in The Morning and 1 Tablet in The Evening 6)  Potassium Chloride Crys Cr 20 Meq Cr-Tabs (Potassium Chloride Crys Cr) .... Take One Tablet By Mouth Daily 7)  Zestril 20 Mg Tabs (Lisinopril) .... Take One Tablet By Mouth Once Daily. 8)  Amiodarone Hcl 200 Mg Tabs (Amiodarone Hcl) .... Take One Tablet By Mouth Daily 9)  Warfarin Sodium 2.5 Mg Tabs (Warfarin Sodium) .... Use As Directed By Anticoagualtion Clinic  Current Medications (verified): 1)  Spironolactone 50 Mg Tabs (Spironolactone) .... Take One Tablet Once Daily 2)  Coreg  25 Mg  Tabs (Carvedilol) .... Take One Tablet Two Times A Day 3)  Adult Aspirin Ec Low Strength 81 Mg  Tbec (Aspirin) .... Once Daily 4)  Vitamin D3 1000 Unit Tabs (Cholecalciferol) .... One By Mouth Daily 5)  Furosemide 40 Mg Tabs (Furosemide) .... Take 2 Tablets in The Morning and 1 Tablet in The Evening 6)  Potassium Chloride Crys Cr 20 Meq Cr-Tabs (Potassium Chloride Crys Cr) .... Take One Tablet By Mouth Daily 7)  Zestril 20 Mg Tabs (Lisinopril) .... Take One Tablet By Mouth Once Daily. 8)  Amiodarone Hcl 200 Mg Tabs (Amiodarone Hcl) .... Take One Tablet By Mouth Daily 9)  Warfarin Sodium 2.5 Mg Tabs (Warfarin Sodium) .... Use As Directed By Anticoagualtion Clinic  Allergies (verified): No Known Drug Allergies  Past History:  Past Surgical History: Last updated: 11/10/2009 Hysterectomy Cardiac cath - Holter: Up to 7 beats, unsustained vtech.   March 28, 2004 [facility] Cardiologist: Rec ICD Implant but Pt declined. November 2007 - DEXA: Osteopenia colonoscopy 12/07 mild left diverticulosis.Recheck 2017   ICD implantation with defibrillation threshold testing. 09/05/09  Family History: Last updated: 11/26/2009 Mother died at age 11 for unknown reason.  Father died   at age 99 of unknown reason.  Siblings, brother had CVA.      Social History: Last updated: 11/16/2010  She lives here in [place] with her boyfriend.  They   have three adult children.  She has a remote history of tobacco abuse.   EtOH as stated above.  Denies any illicit substance use according to family.  Alcohol use-no Drug use-no Regular exercise-no  Risk Factors: Alcohol Use: 0 (11/16/2010) Exercise: no (11/16/2010)  Risk Factors: Smoking Status: never (11/16/2010)  Past Medical History: VENTRICULAR FIBRILLATION  ARREST 12/10 with successful resucitation s/p ICD implant 09/05/09 ATRIAL FIBRILLATION/ atrial flutter OSTEOPENIA (ICD-733.90) HYPERTENSION, MODERATE (ICD-401.9) CARDIOMYOPATHY, PRIMARY NEC  (ICD-425.4) CHF (ICD-428.0)   Anemia-NOS  Family History: Reviewed history from 2009-11-26 and no changes required. Mother died at age 39 for unknown reason.  Father died   at age 63 of unknown reason.  Siblings, brother had CVA.      Social History: Reviewed history from 09/22/2009 and no changes required.  She lives here in [place] with her boyfriend.  They   have three adult children.  She has a remote history of tobacco abuse.   EtOH as stated above.  Denies any illicit substance use according to family.  Alcohol use-no Drug use-no Regular exercise-no Smoking Status:  never Hepatitis Risk:  no risk noted HIV Risk:  no risk noted STD Risk:  no risk noted Blood Transfusions:  no Drug Use:  no Does Patient Exercise:  no  Review of Systems       The patient complains of hematochezia.  The patient denies anorexia, fever, weight loss, weight gain, chest pain, syncope, dyspnea on exertion, peripheral edema, prolonged cough, headaches, hemoptysis, abdominal pain, melena, severe indigestion/heartburn, hematuria, suspicious skin lesions, and abnormal bleeding.   GI:  Complains of bloody stools; denies abdominal pain, change in bowel habits, constipation, dark tarry stools, diarrhea, gas, indigestion, loss of appetite, nausea, vomiting, vomiting blood, and yellowish skin color. Heme:  Complains of bleeding; denies abnormal bruising, enlarge lymph nodes, fevers, pallor, and skin discoloration.  Physical Exam  General:  alert, well-developed, well-nourished, well-hydrated, appropriate dress, normal appearance, healthy-appearing, and overweight-appearing.   Head:  normocephalic, atraumatic, no abnormalities observed, and no abnormalities palpated.   Eyes:  vision grossly intact, pupils equal, and pupils round.   Ears:  R ear normal and L ear normal.   Mouth:  Oral mucosa and oropharynx without lesions or exudates.  Teeth in good repair. Lungs:  normal respiratory effort, no  intercostal retractions, no accessory muscle use, normal breath sounds, no dullness, no fremitus, no crackles, and no wheezes.   Heart:  normal rate, regular rhythm, no murmur, no gallop, no rub, and no JVD.   Abdomen:  soft, non-tender, normal bowel sounds, no distention, no masses, no guarding, no rigidity, no rebound tenderness, no abdominal hernia, no inguinal hernia, no hepatomegaly, no splenomegaly, and abdominal scar(s).   Rectal:  no external abnormalities, normal sphincter tone, no masses, no tenderness, no fissures, no fistulae, no perianal rash, stool positive for occult blood, and external hemorrhoid(s).  no bright red blood or active bleeding. Genitalia:  normal introitus and no external lesions.   Msk:  No deformity or scoliosis noted of thoracic or lumbar spine.   Pulses:  R and L carotid,radial,femoral,dorsalis pedis and posterior tibial pulses are full and equal bilaterally Extremities:  No clubbing, cyanosis, edema, or deformity noted with normal full range of motion of all joints.   Neurologic:  No cranial nerve deficits noted. Station and gait are normal. Plantar reflexes are down-going bilaterally. DTRs are symmetrical throughout. Sensory, motor and coordinative functions appear intact. Skin:  turgor normal, color normal, no rashes, no suspicious lesions, no ecchymoses, no petechiae, no purpura, no ulcerations, and no edema.   Cervical Nodes:  no anterior cervical adenopathy and no posterior cervical adenopathy.   Psych:  Oriented X3, memory intact for recent and remote, normally interactive, good eye contact, not anxious appearing, not depressed appearing, not agitated, and not suicidal.     Impression & Recommendations:  Problem # 1:  BLOOD IN STOOL (ICD-578.1) Assessment New she may need another set of endoscopies to look for bleeding though as of today it looks like the most likely source of her rectal bleeding is external anal hemorrhoids Orders: Gastroenterology  Referral (GI) TLB-B12 + Folate Pnl ([id number]/JXB) TLB-IBC Pnl (Iron/FE;Transferrin) (83550-IBC) TLB-CBC Platelet - w/Differential (85025-CBCD)  Problem # 2:  ANEMIA-NOS (ICD-285.9) Assessment: New  Orders: Gastroenterology Referral (GI) TLB-B12 + Folate Pnl ([id number]/MVH) TLB-IBC Pnl (Iron/FE;Transferrin) (83550-IBC) TLB-CBC Platelet - w/Differential (85025-CBCD)  Hgb: 12.1 (11/10/2009)   Hct: 37.3 (11/10/2009)   Platelets: 204.0 (11/10/2009) RBC: 4.22 (11/10/2009)   RDW: 18.6 H % (11/10/2009)   WBC: 7.1 (11/10/2009) MCV: 88.4 (11/10/2009)  MCHC: 32.4 (11/10/2009) TSH: 2.25 (06/01/2010)  Problem # 3:  HYPERTENSION, MODERATE (ICD-401.9) Assessment: Improved  Her updated medication list for this problem includes:    Spironolactone 50 Mg Tabs (Spironolactone) .[name]... Take one tablet once daily    Coreg 25 Mg Tabs (Carvedilol) .[name]... Take one tablet two times a day    Furosemide 40 Mg Tabs (Furosemide) .[name]... Take 2 tablets in the morning and 1 tablet in the evening    Zestril 20 Mg Tabs (Lisinopril) .[name]... Take one tablet by mouth once daily.  BP today: 98/62 Prior BP: 100/60 (11/15/2010)  Labs Reviewed: K+: 4.7 (06/01/2010) Creat: : 1.5 (06/01/2010)     Complete Medication List: 1)  Spironolactone 50 Mg Tabs (Spironolactone) .... Take one tablet once daily 2)  Coreg 25 Mg Tabs (Carvedilol) .... Take one tablet two times a day 3)  Adult Aspirin Ec Low Strength 81 Mg Tbec (Aspirin) .... Once daily 4)  Vitamin D3 1000 Unit Tabs (Cholecalciferol) .... One by mouth daily 5)  Furosemide 40 Mg Tabs (Furosemide) .... Take 2 tablets in the morning and 1 tablet in the evening 6)  Potassium Chloride Crys Cr 20 Meq Cr-tabs (Potassium chloride crys cr) .... Take one tablet by mouth daily 7)  Zestril 20 Mg Tabs (Lisinopril) .... Take one tablet by mouth once daily. 8)  Amiodarone Hcl 200 Mg Tabs (Amiodarone hcl) .... Take one tablet by mouth daily 9)  Warfarin Sodium 2.5 Mg Tabs  (Warfarin sodium) .... Use as directed by anticoagualtion clinic  Colorectal Screening:  Colonoscopy Results:    Date of Exam: 12/12/2009    Results: Normal  Colonoscopy Comments:    Dr. Elnoria Howard  PAP Screening:    Last PAP smear:  09/03/2007  Mammogram Screening:    Last Mammogram:  11/05/2010  Osteoporosis Risk Assessment:  Risk Factors for Fracture or Low Bone Density:   Smoking status:       never  Immunization & Chemoprophylaxis:    Tetanus vaccine: Historical  (09/03/2001)    Influenza vaccine: given  (09/02/2010)    Pneumovax: Historical  (07/15/2006)  Patient Instructions: 1)  Please schedule a follow-up appointment in 2 weeks.   Orders Added: 1)  Gastroenterology Referral [GI] 2)  TLB-B12 + Folate Pnl [[id number]-B12/FOL] 3)  TLB-IBC Pnl (Iron/FE;Transferrin) [83550-IBC] 4)  TLB-CBC Platelet - w/Differential [85025-CBCD] 5)  New Patient Level IV [81191]    Preventive Care Screening  Mammogram:    Date:  11/05/2010    Results:  normal   Last Flu Shot:    Date:  09/02/2010    Results:  given   Bone Density:    Date:  09/03/2007    Results:  done std dev  Pap Smear:    Date:  09/03/2007    Results:  normal

## 2010-11-20 NOTE — Assessment & Plan Note (Signed)
Summary: df2/rs from bumplist/mb/kl   Visit Type:  Follow-up Referring Provider:  Linde Gillis, MD Primary Provider:  None   History of Present Illness: The patient presents today for routine electrophysiology followup. She reports doing very well since last being seen in our clinic. S She denies any further ICD therapy.   She has noticed BRBPR for which she is going to see our primary care physicians soon.   The patient denies symptoms of palpitations, chest pain, shortness of breath, orthopnea, PND, lower extremity edema, dizziness, presyncope, syncope, or neurologic sequela. The patient is tolerating medications without difficulties and is otherwise without complaint today.   Current Medications (verified): 1)  Spironolactone 50 Mg Tabs (Spironolactone) .... Take One Tablet Once Daily 2)  Coreg 25 Mg  Tabs (Carvedilol) .... Take One Tablet Two Times A Day 3)  Adult Aspirin Ec Low Strength 81 Mg  Tbec (Aspirin) .... Once Daily 4)  Vitamin D3 1000 Unit Tabs (Cholecalciferol) .... One By Mouth Daily 5)  Furosemide 40 Mg Tabs (Furosemide) .... Take 2 Tablets in The Morning and 1 Tablet in The Evening 6)  Potassium Chloride Crys Cr 20 Meq Cr-Tabs (Potassium Chloride Crys Cr) .... Take One Tablet By Mouth Daily 7)  Zestril 20 Mg Tabs (Lisinopril) .... Take One Tablet By Mouth Once Daily. 8)  Amiodarone Hcl 200 Mg Tabs (Amiodarone Hcl) .... Take One Tablet By Mouth Daily 9)  Warfarin Sodium 2.5 Mg Tabs (Warfarin Sodium) .... Use As Directed By Anticoagualtion Clinic  Allergies (verified): No Known Drug Allergies  Past History:  Past Medical History: Reviewed history from 06/01/2010 and no changes required. VENTRICULAR FIBRILLATION  ARREST 12/10 with successful resucitation s/p ICD implant 09/05/09 ATRIAL FIBRILLATION/ atrial flutter OSTEOPENIA (ICD-733.90) HYPERTENSION, MODERATE (ICD-401.9) CARDIOMYOPATHY, PRIMARY NEC (ICD-425.4) CHF (ICD-428.0)    Past Surgical History: Reviewed  history from 11/10/2009 and no changes required. Hysterectomy Cardiac cath - Holter: Up to 7 beats, unsustained vtech.   March 28, 2004 [facility] Cardiologist: Rec ICD Implant but Pt declined. November 2007 - DEXA: Osteopenia colonoscopy 12/07 mild left diverticulosis.Recheck 2017   ICD implantation with defibrillation threshold testing. 09/05/09  Social History: Reviewed history from 09/22/2009 and no changes required.  She lives here in [place] with her boyfriend.  They   have three adult children.  She has a remote history of tobacco abuse.   EtOH as stated above.  Denies any illicit substance use according to family.   Review of Systems       All systems are reviewed and negative except as listed in the HPI.   Vital Signs:  Patient profile:   76 year old female Height:      60 inches Weight:      157 pounds BMI:     30.77 Pulse rate:   60 / minute BP sitting:   100 / 60  (left arm)  Vitals Entered By: Laurance Flatten CMA (November 15, 2010 2:58 PM)  Physical Exam  General:  Well developed, well nourished, in no acute distress. Head:  normocephalic and atraumatic Eyes:  PERRLA/EOM intact; conjunctiva and lids normal. Mouth:  Teeth, gums and palate normal. Oral mucosa normal. Neck:  Neck supple, no JVD. No masses, thyromegaly or abnormal cervical nodes. Chest Wall:  ICD pocket is well healed Lungs:  Clear bilaterally to auscultation and percussion. Heart:  RRR, 2/6 SEM LLSB Abdomen:  Bowel sounds positive; abdomen soft and non-tender without masses, organomegaly, or hernias noted. No hepatosplenomegaly. Msk:  Back normal, normal gait.  Muscle strength and tone normal. Extremities:  No clubbing or cyanosis. Neurologic:  Alert and oriented x 3.    ICD Specifications Following MD:  Hillis Range, MD     ICD Vendor:  [employer]     ICD Model Number:  [id number]     ICD Serial Number:  [id number] ICD DOI:  09/05/2009     ICD Implanting MD:  Hillis Range, MD  Lead 1:    Location: RA     DOI:  09/05/2009     Model #: [id number]     Serial #: [id number]     Status: active Lead 2:    Location: RV     DOI: 09/05/2009     Model #: [id number]     Serial #: [id number]     Status: active  Indications::  Sudden cardiac death   Episodes Coumadin:  Yes  Brady Parameters Mode DDD     Lower Rate Limit:  60     Upper Rate Limit 110 PAV 200     Sensed AV Delay:  200  Tachy Zones VF:  200     VT:  171     MD Comments:  see scanned  Impression & Recommendations:  Problem # 1:  VENTRICULAR FIBRILLATION (ICD-427.41)  doing well on amiodarone check tfts and lfts today  Orders: TLB-CBC Platelet - w/Differential (85025-CBCD) TLB-BMP (Basic Metabolic Panel-BMET) (80048-METABOL) TLB-TSH (Thyroid Stimulating Hormone) (84443-TSH) TLB-T4 (Thyrox), Free (69629-BM8U) TLB-Hepatic/Liver Function Pnl (80076-HEPATIC)  Problem # 2:  ATRIAL FIBRILLATION, HX OF (ICD-V12.59)  maintaining sinus  continue coumadin check CBC given BRBPTP  Her updated medication list for this problem includes:    Coreg 25 Mg Tabs (Carvedilol) .[name]... Take one tablet two times a day    Adult Aspirin Ec Low Strength 81 Mg Tbec (Aspirin) ..... Once daily    Zestril 20 Mg Tabs (Lisinopril) .[name]... Take one tablet by mouth once daily.    Amiodarone Hcl 200 Mg Tabs (Amiodarone hcl) .[name]... Take one tablet by mouth daily    Warfarin Sodium 2.5 Mg Tabs (Warfarin sodium) ..... Use as directed by anticoagualtion clinic  Orders: TLB-CBC Platelet - w/Differential (85025-CBCD) TLB-BMP (Basic Metabolic Panel-BMET) (80048-METABOL) TLB-TSH (Thyroid Stimulating Hormone) (84443-TSH) TLB-T4 (Thyrox), Free [phone]) TLB-Hepatic/Liver Function Pnl (80076-HEPATIC)  Problem # 3:  CARDIOMYOPATHY, PRIMARY NEC (ICD-425.4)  stable chronic systolic dysfunction normal ICD function see scanned report she will follow-up with Dr Glori Luis next week.  Orders: TLB-CBC Platelet - w/Differential (85025-CBCD) TLB-BMP (Basic Metabolic Panel-BMET)  (80048-METABOL) TLB-TSH (Thyroid Stimulating Hormone) (84443-TSH) TLB-T4 (Thyrox), Free [id number]) TLB-Hepatic/Liver Function Pnl (80076-HEPATIC)  Patient Instructions: 1)  Your physician wants you to follow-up in: 6 months with Dr Johney Frame  Bonita Quin will receive a reminder letter in the mail two months in advance. If you don't receive a letter, please call our office to schedule the follow-up appointment. 2)  Your physician recommends that you return for lab work today 3)  Merlin transmission 02/13/2013

## 2010-11-29 NOTE — Cardiovascular Report (Signed)
Summary: Office Visit   Office Visit   Imported By: Roderic Ovens 11/23/2010 14:41:09  _____________________________________________________________________  External Attachment:    Type:   Image     Comment:   External Document

## 2010-11-30 ENCOUNTER — Ambulatory Visit (INDEPENDENT_AMBULATORY_CARE_PROVIDER_SITE_OTHER): Payer: Medicare Other | Admitting: *Deleted

## 2010-11-30 DIAGNOSIS — I5023 Acute on chronic systolic (congestive) heart failure: Secondary | ICD-10-CM | POA: Insufficient documentation

## 2010-11-30 DIAGNOSIS — I4892 Unspecified atrial flutter: Secondary | ICD-10-CM

## 2010-11-30 DIAGNOSIS — I4891 Unspecified atrial fibrillation: Secondary | ICD-10-CM

## 2010-11-30 DIAGNOSIS — Z8679 Personal history of other diseases of the circulatory system: Secondary | ICD-10-CM

## 2010-11-30 LAB — POCT INR: INR: 2.8

## 2010-11-30 NOTE — Patient Instructions (Signed)
Continue on same dosage 1 tablet daily except 2 tablets on Tuesdays.  Recheck in 3 weeks.

## 2010-12-03 LAB — URINALYSIS, ROUTINE W REFLEX MICROSCOPIC
Bilirubin Urine: NEGATIVE
Glucose, UA: 100 mg/dL — AB
Ketones, ur: NEGATIVE mg/dL
Leukocytes, UA: NEGATIVE
Nitrite: NEGATIVE
Protein, ur: 100 mg/dL — AB
Specific Gravity, Urine: 1.014 (ref 1.005–1.030)
Urobilinogen, UA: 0.2 mg/dL (ref 0.0–1.0)
pH: 7 (ref 5.0–8.0)

## 2010-12-03 LAB — BLOOD GAS, ARTERIAL
Acid-base deficit: 4.1 mmol/L — ABNORMAL HIGH (ref 0.0–2.0)
Acid-base deficit: 5.3 mmol/L — ABNORMAL HIGH (ref 0.0–2.0)
Acid-base deficit: 7.6 mmol/L — ABNORMAL HIGH (ref 0.0–2.0)
Bicarbonate: 17.5 mEq/L — ABNORMAL LOW (ref 20.0–24.0)
Bicarbonate: 19 mEq/L — ABNORMAL LOW (ref 20.0–24.0)
Bicarbonate: 20.9 mEq/L (ref 20.0–24.0)
Drawn by: 23604
Drawn by: 321311
FIO2: 0.3 %
FIO2: 0.5 %
FIO2: 30 %
MECHVT: 0.45 mL
MECHVT: 400 mL
MECHVT: 400 mL
O2 Saturation: 97.8 %
O2 Saturation: 98.8 %
O2 Saturation: 99.3 %
PEEP: 5 cmH2O
PEEP: 5 cmH2O
PEEP: 5 cmH2O
Patient temperature: 91.4
Patient temperature: 91.4
Patient temperature: 99.8
RATE: 15 resp/min
RATE: 15 resp/min
RATE: 15 resp/min
TCO2: 18.6 mmol/L (ref 0–100)
TCO2: 20 mmol/L (ref 0–100)
TCO2: 22.1 mmol/L (ref 0–100)
pCO2 arterial: 27.3 mmHg — ABNORMAL LOW (ref 35.0–45.0)
pCO2 arterial: 29.7 mmHg — ABNORMAL LOW (ref 35.0–45.0)
pCO2 arterial: 41.9 mmHg (ref 35.0–45.0)
pH, Arterial: 7.322 — ABNORMAL LOW (ref 7.350–7.400)
pH, Arterial: 7.364 (ref 7.350–7.400)
pH, Arterial: 7.434 — ABNORMAL HIGH (ref 7.350–7.400)
pO2, Arterial: 142 mmHg — ABNORMAL HIGH (ref 80.0–100.0)
pO2, Arterial: 178 mmHg — ABNORMAL HIGH (ref 80.0–100.0)
pO2, Arterial: 186 mmHg — ABNORMAL HIGH (ref 80.0–100.0)

## 2010-12-03 LAB — BASIC METABOLIC PANEL
BUN: 10 mg/dL (ref 6–23)
BUN: 12 mg/dL (ref 6–23)
BUN: 13 mg/dL (ref 6–23)
BUN: 13 mg/dL (ref 6–23)
BUN: 14 mg/dL (ref 6–23)
BUN: 15 mg/dL (ref 6–23)
BUN: 15 mg/dL (ref 6–23)
BUN: 17 mg/dL (ref 6–23)
BUN: 18 mg/dL (ref 6–23)
BUN: 3 mg/dL — ABNORMAL LOW (ref 6–23)
BUN: 5 mg/dL — ABNORMAL LOW (ref 6–23)
BUN: 5 mg/dL — ABNORMAL LOW (ref 6–23)
BUN: 7 mg/dL (ref 6–23)
CO2: 19 mEq/L (ref 19–32)
CO2: 19 mEq/L (ref 19–32)
CO2: 20 mEq/L (ref 19–32)
CO2: 20 mEq/L (ref 19–32)
CO2: 20 mEq/L (ref 19–32)
CO2: 20 mEq/L (ref 19–32)
CO2: 20 mEq/L (ref 19–32)
CO2: 21 mEq/L (ref 19–32)
CO2: 21 mEq/L (ref 19–32)
CO2: 22 mEq/L (ref 19–32)
CO2: 22 mEq/L (ref 19–32)
CO2: 23 mEq/L (ref 19–32)
CO2: 24 mEq/L (ref 19–32)
Calcium: 7.4 mg/dL — ABNORMAL LOW (ref 8.4–10.5)
Calcium: 7.5 mg/dL — ABNORMAL LOW (ref 8.4–10.5)
Calcium: 7.8 mg/dL — ABNORMAL LOW (ref 8.4–10.5)
Calcium: 7.8 mg/dL — ABNORMAL LOW (ref 8.4–10.5)
Calcium: 7.8 mg/dL — ABNORMAL LOW (ref 8.4–10.5)
Calcium: 7.8 mg/dL — ABNORMAL LOW (ref 8.4–10.5)
Calcium: 7.8 mg/dL — ABNORMAL LOW (ref 8.4–10.5)
Calcium: 7.9 mg/dL — ABNORMAL LOW (ref 8.4–10.5)
Calcium: 7.9 mg/dL — ABNORMAL LOW (ref 8.4–10.5)
Calcium: 8 mg/dL — ABNORMAL LOW (ref 8.4–10.5)
Calcium: 8.2 mg/dL — ABNORMAL LOW (ref 8.4–10.5)
Calcium: 8.5 mg/dL (ref 8.4–10.5)
Calcium: 8.9 mg/dL (ref 8.4–10.5)
Chloride: 110 mEq/L (ref 96–112)
Chloride: 112 mEq/L (ref 96–112)
Chloride: 112 mEq/L (ref 96–112)
Chloride: 113 mEq/L — ABNORMAL HIGH (ref 96–112)
Chloride: 113 mEq/L — ABNORMAL HIGH (ref 96–112)
Chloride: 113 mEq/L — ABNORMAL HIGH (ref 96–112)
Chloride: 114 mEq/L — ABNORMAL HIGH (ref 96–112)
Chloride: 114 mEq/L — ABNORMAL HIGH (ref 96–112)
Chloride: 114 mEq/L — ABNORMAL HIGH (ref 96–112)
Chloride: 114 mEq/L — ABNORMAL HIGH (ref 96–112)
Chloride: 114 mEq/L — ABNORMAL HIGH (ref 96–112)
Chloride: 116 mEq/L — ABNORMAL HIGH (ref 96–112)
Chloride: 119 mEq/L — ABNORMAL HIGH (ref 96–112)
Creatinine, Ser: 0.75 mg/dL (ref 0.4–1.2)
Creatinine, Ser: 0.78 mg/dL (ref 0.4–1.2)
Creatinine, Ser: 0.8 mg/dL (ref 0.4–1.2)
Creatinine, Ser: 0.81 mg/dL (ref 0.4–1.2)
Creatinine, Ser: 0.82 mg/dL (ref 0.4–1.2)
Creatinine, Ser: 0.83 mg/dL (ref 0.4–1.2)
Creatinine, Ser: 0.84 mg/dL (ref 0.4–1.2)
Creatinine, Ser: 0.86 mg/dL (ref 0.4–1.2)
Creatinine, Ser: 0.87 mg/dL (ref 0.4–1.2)
Creatinine, Ser: 0.89 mg/dL (ref 0.4–1.2)
Creatinine, Ser: 0.92 mg/dL (ref 0.4–1.2)
Creatinine, Ser: 0.94 mg/dL (ref 0.4–1.2)
Creatinine, Ser: 0.94 mg/dL (ref 0.4–1.2)
GFR calc Af Amer: >60 mL/min (ref >60)
GFR calc Af Amer: >60 mL/min (ref >60)
GFR calc Af Amer: >60 mL/min (ref >60)
GFR calc Af Amer: >60 mL/min (ref >60)
GFR calc Af Amer: >60 mL/min (ref >60)
GFR calc Af Amer: >60 mL/min (ref >60)
GFR calc Af Amer: >60 mL/min (ref >60)
GFR calc Af Amer: >60 mL/min (ref >60)
GFR calc Af Amer: >60 mL/min (ref >60)
GFR calc Af Amer: >60 mL/min (ref >60)
GFR calc Af Amer: >60 mL/min (ref >60)
GFR calc Af Amer: >60 mL/min (ref >60)
GFR calc Af Amer: >60 mL/min (ref >60)
GFR calc non Af Amer: 59 mL/min — ABNORMAL LOW (ref >60)
GFR calc non Af Amer: 59 mL/min — ABNORMAL LOW (ref >60)
GFR calc non Af Amer: >60 mL/min (ref >60)
GFR calc non Af Amer: >60 mL/min (ref >60)
GFR calc non Af Amer: >60 mL/min (ref >60)
GFR calc non Af Amer: >60 mL/min (ref >60)
GFR calc non Af Amer: >60 mL/min (ref >60)
GFR calc non Af Amer: >60 mL/min (ref >60)
GFR calc non Af Amer: >60 mL/min (ref >60)
GFR calc non Af Amer: >60 mL/min (ref >60)
GFR calc non Af Amer: >60 mL/min (ref >60)
GFR calc non Af Amer: >60 mL/min (ref >60)
GFR calc non Af Amer: >60 mL/min (ref >60)
Glucose, Bld: 100 mg/dL — ABNORMAL HIGH (ref 70–99)
Glucose, Bld: 109 mg/dL — ABNORMAL HIGH (ref 70–99)
Glucose, Bld: 111 mg/dL — ABNORMAL HIGH (ref 70–99)
Glucose, Bld: 113 mg/dL — ABNORMAL HIGH (ref 70–99)
Glucose, Bld: 121 mg/dL — ABNORMAL HIGH (ref 70–99)
Glucose, Bld: 124 mg/dL — ABNORMAL HIGH (ref 70–99)
Glucose, Bld: 127 mg/dL — ABNORMAL HIGH (ref 70–99)
Glucose, Bld: 138 mg/dL — ABNORMAL HIGH (ref 70–99)
Glucose, Bld: 153 mg/dL — ABNORMAL HIGH (ref 70–99)
Glucose, Bld: 225 mg/dL — ABNORMAL HIGH (ref 70–99)
Glucose, Bld: 89 mg/dL (ref 70–99)
Glucose, Bld: 95 mg/dL (ref 70–99)
Glucose, Bld: 97 mg/dL (ref 70–99)
Potassium: 3.3 mEq/L — ABNORMAL LOW (ref 3.5–5.1)
Potassium: 3.3 mEq/L — ABNORMAL LOW (ref 3.5–5.1)
Potassium: 3.4 mEq/L — ABNORMAL LOW (ref 3.5–5.1)
Potassium: 3.6 mEq/L (ref 3.5–5.1)
Potassium: 3.7 mEq/L (ref 3.5–5.1)
Potassium: 3.8 mEq/L (ref 3.5–5.1)
Potassium: 3.8 mEq/L (ref 3.5–5.1)
Potassium: 3.9 mEq/L (ref 3.5–5.1)
Potassium: 4 mEq/L (ref 3.5–5.1)
Potassium: 4 mEq/L (ref 3.5–5.1)
Potassium: 4.1 mEq/L (ref 3.5–5.1)
Potassium: 4.2 mEq/L (ref 3.5–5.1)
Potassium: 4.9 mEq/L (ref 3.5–5.1)
Sodium: 137 mEq/L (ref 135–145)
Sodium: 137 mEq/L (ref 135–145)
Sodium: 139 mEq/L (ref 135–145)
Sodium: 140 mEq/L (ref 135–145)
Sodium: 140 mEq/L (ref 135–145)
Sodium: 140 mEq/L (ref 135–145)
Sodium: 140 mEq/L (ref 135–145)
Sodium: 141 mEq/L (ref 135–145)
Sodium: 142 mEq/L (ref 135–145)
Sodium: 143 mEq/L (ref 135–145)
Sodium: 143 mEq/L (ref 135–145)
Sodium: 143 mEq/L (ref 135–145)
Sodium: 144 mEq/L (ref 135–145)

## 2010-12-03 LAB — CBC
HCT: 26.7 % — ABNORMAL LOW (ref 36.0–46.0)
HCT: 28.4 % — ABNORMAL LOW (ref 36.0–46.0)
HCT: 29.2 % — ABNORMAL LOW (ref 36.0–46.0)
HCT: 32.3 % — ABNORMAL LOW (ref 36.0–46.0)
HCT: 33.8 % — ABNORMAL LOW (ref 36.0–46.0)
HCT: 34 % — ABNORMAL LOW (ref 36.0–46.0)
Hemoglobin: 10.9 g/dL — ABNORMAL LOW (ref 12.0–15.0)
Hemoglobin: 11.4 g/dL — ABNORMAL LOW (ref 12.0–15.0)
Hemoglobin: 11.4 g/dL — ABNORMAL LOW (ref 12.0–15.0)
Hemoglobin: 8.8 g/dL — ABNORMAL LOW (ref 12.0–15.0)
Hemoglobin: 9.6 g/dL — ABNORMAL LOW (ref 12.0–15.0)
Hemoglobin: 9.7 g/dL — ABNORMAL LOW (ref 12.0–15.0)
MCHC: 33.1 g/dL (ref 30.0–36.0)
MCHC: 33.3 g/dL (ref 30.0–36.0)
MCHC: 33.7 g/dL (ref 30.0–36.0)
MCHC: 33.7 g/dL (ref 30.0–36.0)
MCHC: 33.7 g/dL (ref 30.0–36.0)
MCHC: 33.8 g/dL (ref 30.0–36.0)
MCV: 87.2 fL (ref 78.0–100.0)
MCV: 87.3 fL (ref 78.0–100.0)
MCV: 87.6 fL (ref 78.0–100.0)
MCV: 87.6 fL (ref 78.0–100.0)
MCV: 87.8 fL (ref 78.0–100.0)
MCV: 87.9 fL (ref 78.0–100.0)
Platelets: 164 10*3/uL (ref 150–400)
Platelets: 179 10*3/uL (ref 150–400)
Platelets: 201 10*3/uL (ref 150–400)
Platelets: 206 10*3/uL (ref 150–400)
Platelets: 209 10*3/uL (ref 150–400)
Platelets: 213 10*3/uL (ref 150–400)
RBC: 3.04 MIL/uL — ABNORMAL LOW (ref 3.87–5.11)
RBC: 3.24 MIL/uL — ABNORMAL LOW (ref 3.87–5.11)
RBC: 3.32 MIL/uL — ABNORMAL LOW (ref 3.87–5.11)
RBC: 3.7 MIL/uL — ABNORMAL LOW (ref 3.87–5.11)
RBC: 3.87 MIL/uL (ref 3.87–5.11)
RBC: 3.88 MIL/uL (ref 3.87–5.11)
RDW: 15.9 % — ABNORMAL HIGH (ref 11.5–15.5)
RDW: 16.2 % — ABNORMAL HIGH (ref 11.5–15.5)
RDW: 16.3 % — ABNORMAL HIGH (ref 11.5–15.5)
RDW: 16.4 % — ABNORMAL HIGH (ref 11.5–15.5)
RDW: 16.5 % — ABNORMAL HIGH (ref 11.5–15.5)
RDW: 16.7 % — ABNORMAL HIGH (ref 11.5–15.5)
WBC: 10.9 10*3/uL — ABNORMAL HIGH (ref 4.0–10.5)
WBC: 11.9 10*3/uL — ABNORMAL HIGH (ref 4.0–10.5)
WBC: 12.2 10*3/uL — ABNORMAL HIGH (ref 4.0–10.5)
WBC: 12.9 10*3/uL — ABNORMAL HIGH (ref 4.0–10.5)
WBC: 9 10*3/uL (ref 4.0–10.5)
WBC: 9.1 10*3/uL (ref 4.0–10.5)

## 2010-12-03 LAB — CARDIAC PANEL(CRET KIN+CKTOT+MB+TROPI)
CK, MB: 4.4 ng/mL — ABNORMAL HIGH (ref 0.3–4.0)
CK, MB: 8.4 ng/mL — ABNORMAL HIGH (ref 0.3–4.0)
CK, MB: 9 ng/mL — ABNORMAL HIGH (ref 0.3–4.0)
Relative Index: 2.5 (ref 0.0–2.5)
Relative Index: 2.9 — ABNORMAL HIGH (ref 0.0–2.5)
Relative Index: 3.4 — ABNORMAL HIGH (ref 0.0–2.5)
Total CK: 178 U/L — ABNORMAL HIGH (ref 7–177)
Total CK: 245 U/L — ABNORMAL HIGH (ref 7–177)
Total CK: 311 U/L — ABNORMAL HIGH (ref 7–177)
Troponin I: 0.05 ng/mL (ref 0.00–0.06)
Troponin I: 0.08 ng/mL — ABNORMAL HIGH (ref 0.00–0.06)
Troponin I: 0.11 ng/mL — ABNORMAL HIGH (ref 0.00–0.06)

## 2010-12-03 LAB — LEGIONELLA ANTIGEN, URINE: Legionella Antigen, Urine: NEGATIVE

## 2010-12-03 LAB — POCT I-STAT, CHEM 8
BUN: 15 mg/dL (ref 6–23)
BUN: 19 mg/dL (ref 6–23)
Calcium, Ion: 0.9 mmol/L — ABNORMAL LOW (ref 1.12–1.32)
Calcium, Ion: 1.08 mmol/L — ABNORMAL LOW (ref 1.12–1.32)
Chloride: 107 mEq/L (ref 96–112)
Chloride: 111 mEq/L (ref 96–112)
Creatinine, Ser: 0.7 mg/dL (ref 0.4–1.2)
Creatinine, Ser: 0.8 mg/dL (ref 0.4–1.2)
Glucose, Bld: 249 mg/dL — ABNORMAL HIGH (ref 70–99)
Glucose, Bld: 286 mg/dL — ABNORMAL HIGH (ref 70–99)
HCT: 30 % — ABNORMAL LOW (ref 36.0–46.0)
HCT: 38 % (ref 36.0–46.0)
Hemoglobin: 10.2 g/dL — ABNORMAL LOW (ref 12.0–15.0)
Hemoglobin: 12.9 g/dL (ref 12.0–15.0)
Potassium: 4.8 mEq/L (ref 3.5–5.1)
Potassium: 6.1 mEq/L — ABNORMAL HIGH (ref 3.5–5.1)
Sodium: 135 mEq/L (ref 135–145)
Sodium: 137 mEq/L (ref 135–145)
TCO2: 20 mmol/L (ref 0–100)
TCO2: 22 mmol/L (ref 0–100)

## 2010-12-03 LAB — DIGOXIN LEVEL: Digoxin Level: 0.3 ng/mL — ABNORMAL LOW (ref 0.8–2.0)

## 2010-12-03 LAB — COMPREHENSIVE METABOLIC PANEL
ALT: 247 U/L — ABNORMAL HIGH (ref 0–35)
AST: 342 U/L — ABNORMAL HIGH (ref 0–37)
Albumin: 2.9 g/dL — ABNORMAL LOW (ref 3.5–5.2)
Alkaline Phosphatase: 104 U/L (ref 39–117)
BUN: 14 mg/dL (ref 6–23)
CO2: 20 mEq/L (ref 19–32)
Calcium: 7.6 mg/dL — ABNORMAL LOW (ref 8.4–10.5)
Chloride: 112 mEq/L (ref 96–112)
Creatinine, Ser: 0.79 mg/dL (ref 0.4–1.2)
GFR calc Af Amer: >60 mL/min (ref >60)
GFR calc non Af Amer: >60 mL/min (ref >60)
Glucose, Bld: 153 mg/dL — ABNORMAL HIGH (ref 70–99)
Potassium: 3.4 mEq/L — ABNORMAL LOW (ref 3.5–5.1)
Sodium: 138 mEq/L (ref 135–145)
Total Bilirubin: 0.6 mg/dL (ref 0.3–1.2)
Total Protein: 6.9 g/dL (ref 6.0–8.3)

## 2010-12-03 LAB — GLUCOSE, CAPILLARY
Glucose-Capillary: 102 mg/dL — ABNORMAL HIGH (ref 70–99)
Glucose-Capillary: 102 mg/dL — ABNORMAL HIGH (ref 70–99)
Glucose-Capillary: 103 mg/dL — ABNORMAL HIGH (ref 70–99)
Glucose-Capillary: 104 mg/dL — ABNORMAL HIGH (ref 70–99)
Glucose-Capillary: 116 mg/dL — ABNORMAL HIGH (ref 70–99)
Glucose-Capillary: 116 mg/dL — ABNORMAL HIGH (ref 70–99)
Glucose-Capillary: 116 mg/dL — ABNORMAL HIGH (ref 70–99)
Glucose-Capillary: 116 mg/dL — ABNORMAL HIGH (ref 70–99)
Glucose-Capillary: 117 mg/dL — ABNORMAL HIGH (ref 70–99)
Glucose-Capillary: 119 mg/dL — ABNORMAL HIGH (ref 70–99)
Glucose-Capillary: 122 mg/dL — ABNORMAL HIGH (ref 70–99)
Glucose-Capillary: 131 mg/dL — ABNORMAL HIGH (ref 70–99)
Glucose-Capillary: 142 mg/dL — ABNORMAL HIGH (ref 70–99)
Glucose-Capillary: 144 mg/dL — ABNORMAL HIGH (ref 70–99)
Glucose-Capillary: 147 mg/dL — ABNORMAL HIGH (ref 70–99)
Glucose-Capillary: 149 mg/dL — ABNORMAL HIGH (ref 70–99)
Glucose-Capillary: 158 mg/dL — ABNORMAL HIGH (ref 70–99)
Glucose-Capillary: 177 mg/dL — ABNORMAL HIGH (ref 70–99)
Glucose-Capillary: 219 mg/dL — ABNORMAL HIGH (ref 70–99)
Glucose-Capillary: 63 mg/dL — ABNORMAL LOW (ref 70–99)
Glucose-Capillary: 72 mg/dL (ref 70–99)
Glucose-Capillary: 75 mg/dL (ref 70–99)
Glucose-Capillary: 78 mg/dL (ref 70–99)
Glucose-Capillary: 84 mg/dL (ref 70–99)
Glucose-Capillary: 84 mg/dL (ref 70–99)
Glucose-Capillary: 85 mg/dL (ref 70–99)
Glucose-Capillary: 85 mg/dL (ref 70–99)
Glucose-Capillary: 86 mg/dL (ref 70–99)
Glucose-Capillary: 87 mg/dL (ref 70–99)
Glucose-Capillary: 87 mg/dL (ref 70–99)
Glucose-Capillary: 88 mg/dL (ref 70–99)
Glucose-Capillary: 88 mg/dL (ref 70–99)
Glucose-Capillary: 93 mg/dL (ref 70–99)
Glucose-Capillary: 93 mg/dL (ref 70–99)
Glucose-Capillary: 95 mg/dL (ref 70–99)
Glucose-Capillary: 95 mg/dL (ref 70–99)
Glucose-Capillary: 96 mg/dL (ref 70–99)
Glucose-Capillary: 97 mg/dL (ref 70–99)
Glucose-Capillary: 97 mg/dL (ref 70–99)
Glucose-Capillary: 98 mg/dL (ref 70–99)
Glucose-Capillary: 99 mg/dL (ref 70–99)
Glucose-Capillary: 99 mg/dL (ref 70–99)

## 2010-12-03 LAB — HEPARIN LEVEL (UNFRACTIONATED)
Heparin Unfractionated: 0.21 IU/mL — ABNORMAL LOW (ref 0.30–0.70)
Heparin Unfractionated: 0.25 IU/mL — ABNORMAL LOW (ref 0.30–0.70)
Heparin Unfractionated: 0.3 IU/mL (ref 0.30–0.70)
Heparin Unfractionated: 0.35 IU/mL (ref 0.30–0.70)
Heparin Unfractionated: 0.42 IU/mL (ref 0.30–0.70)
Heparin Unfractionated: 0.43 IU/mL (ref 0.30–0.70)
Heparin Unfractionated: 0.48 IU/mL (ref 0.30–0.70)

## 2010-12-03 LAB — PHOSPHORUS
Phosphorus: 1.6 mg/dL — ABNORMAL LOW (ref 2.3–4.6)
Phosphorus: 2.4 mg/dL (ref 2.3–4.6)
Phosphorus: 2.4 mg/dL (ref 2.3–4.6)
Phosphorus: 2.6 mg/dL (ref 2.3–4.6)
Phosphorus: 2.9 mg/dL (ref 2.3–4.6)
Phosphorus: 3 mg/dL (ref 2.3–4.6)
Phosphorus: 3 mg/dL (ref 2.3–4.6)

## 2010-12-03 LAB — URINE CULTURE: Colony Count: 2000

## 2010-12-03 LAB — MAGNESIUM
Magnesium: 1.9 mg/dL (ref 1.5–2.5)
Magnesium: 2 mg/dL (ref 1.5–2.5)
Magnesium: 2 mg/dL (ref 1.5–2.5)
Magnesium: 2 mg/dL (ref 1.5–2.5)
Magnesium: 2 mg/dL (ref 1.5–2.5)
Magnesium: 2.1 mg/dL (ref 1.5–2.5)
Magnesium: 2.2 mg/dL (ref 1.5–2.5)

## 2010-12-03 LAB — CARBOXYHEMOGLOBIN
Carboxyhemoglobin: 0.7 % (ref 0.5–1.5)
Carboxyhemoglobin: 0.9 % (ref 0.5–1.5)
Methemoglobin: 1.2 % (ref 0.0–1.5)
Methemoglobin: 1.2 % (ref 0.0–1.5)
O2 Saturation: 60 %
O2 Saturation: 98.4 %
Total hemoglobin: 11.5 g/dL — ABNORMAL LOW (ref 12.5–16.0)
Total hemoglobin: 11.5 g/dL — ABNORMAL LOW (ref 12.5–16.0)

## 2010-12-03 LAB — CULTURE, BLOOD (ROUTINE X 2)
Culture: NO GROWTH
Culture: NO GROWTH

## 2010-12-03 LAB — PROTIME-INR
INR: 1.23 (ref 0.00–1.49)
INR: 1.24 (ref 0.00–1.49)
INR: 1.27 (ref 0.00–1.49)
INR: 1.31 (ref 0.00–1.49)
Prothrombin Time: 15.4 seconds — ABNORMAL HIGH (ref 11.6–15.2)
Prothrombin Time: 15.5 seconds — ABNORMAL HIGH (ref 11.6–15.2)
Prothrombin Time: 15.8 seconds — ABNORMAL HIGH (ref 11.6–15.2)
Prothrombin Time: 16.2 seconds — ABNORMAL HIGH (ref 11.6–15.2)

## 2010-12-03 LAB — CULTURE, BAL-QUANTITATIVE: Gram Stain: NONE SEEN

## 2010-12-03 LAB — APTT
aPTT: 34 seconds (ref 24–37)
aPTT: 93 seconds — ABNORMAL HIGH (ref 24–37)

## 2010-12-03 LAB — RAPID URINE DRUG SCREEN, HOSP PERFORMED
Amphetamines: NOT DETECTED
Barbiturates: NOT DETECTED
Benzodiazepines: POSITIVE — AB
Cocaine: NOT DETECTED
Opiates: NOT DETECTED
Tetrahydrocannabinol: NOT DETECTED

## 2010-12-03 LAB — POCT I-STAT 3, ART BLOOD GAS (G3+)
Acid-base deficit: 3 mmol/L — ABNORMAL HIGH (ref 0.0–2.0)
Bicarbonate: 21.7 mEq/L (ref 20.0–24.0)
O2 Saturation: 100 %
TCO2: 23 mmol/L (ref 0–100)
pCO2 arterial: 37.4 mmHg (ref 35.0–45.0)
pH, Arterial: 7.371 (ref 7.350–7.400)
pO2, Arterial: 312 mmHg — ABNORMAL HIGH (ref 80.0–100.0)

## 2010-12-03 LAB — BRAIN NATRIURETIC PEPTIDE
Pro B Natriuretic peptide (BNP): 112 pg/mL — ABNORMAL HIGH (ref 0.0–100.0)
Pro B Natriuretic peptide (BNP): 234 pg/mL — ABNORMAL HIGH (ref 0.0–100.0)

## 2010-12-03 LAB — LIPASE, BLOOD
Lipase: 12 U/L (ref 11–59)
Lipase: 12 U/L (ref 11–59)
Lipase: 21 U/L (ref 11–59)

## 2010-12-03 LAB — URINE MICROSCOPIC-ADD ON

## 2010-12-03 LAB — TSH: TSH: 3.404 u[IU]/mL (ref 0.350–4.500)

## 2010-12-03 LAB — CULTURE, BAL-QUANTITATIVE W GRAM STAIN
Colony Count: NO GROWTH
Culture: NO GROWTH

## 2010-12-03 LAB — ETHANOL: Alcohol, Ethyl (B): <5 mg/dL (ref 0–10)

## 2010-12-03 LAB — STREP PNEUMONIAE URINARY ANTIGEN: Strep Pneumo Urinary Antigen: NEGATIVE

## 2010-12-03 LAB — CORTISOL: Cortisol, Plasma: 8.5 ug/dL

## 2010-12-03 LAB — LACTIC ACID, PLASMA: Lactic Acid, Venous: 1.3 mmol/L (ref 0.5–2.2)

## 2010-12-21 ENCOUNTER — Ambulatory Visit (INDEPENDENT_AMBULATORY_CARE_PROVIDER_SITE_OTHER): Payer: Medicare Other | Admitting: *Deleted

## 2010-12-21 DIAGNOSIS — I4891 Unspecified atrial fibrillation: Secondary | ICD-10-CM

## 2010-12-21 DIAGNOSIS — Z8679 Personal history of other diseases of the circulatory system: Secondary | ICD-10-CM

## 2010-12-21 DIAGNOSIS — I4892 Unspecified atrial flutter: Secondary | ICD-10-CM

## 2010-12-21 LAB — POCT INR: INR: 3.3

## 2011-01-08 ENCOUNTER — Other Ambulatory Visit: Payer: Self-pay | Admitting: *Deleted

## 2011-01-08 MED ORDER — SPIRONOLACTONE 50 MG PO TABS: 50.0000 mg | ORAL_TABLET | Freq: Every day | ORAL | Status: DC

## 2011-01-11 ENCOUNTER — Ambulatory Visit (INDEPENDENT_AMBULATORY_CARE_PROVIDER_SITE_OTHER): Payer: Medicare Other | Admitting: *Deleted

## 2011-01-11 DIAGNOSIS — I4892 Unspecified atrial flutter: Secondary | ICD-10-CM

## 2011-01-11 DIAGNOSIS — I4891 Unspecified atrial fibrillation: Secondary | ICD-10-CM

## 2011-01-11 DIAGNOSIS — Z8679 Personal history of other diseases of the circulatory system: Secondary | ICD-10-CM

## 2011-01-11 LAB — POCT INR: INR: 2.3

## 2011-02-01 ENCOUNTER — Ambulatory Visit (INDEPENDENT_AMBULATORY_CARE_PROVIDER_SITE_OTHER): Payer: Medicare Other | Admitting: *Deleted

## 2011-02-01 DIAGNOSIS — I4892 Unspecified atrial flutter: Secondary | ICD-10-CM

## 2011-02-01 DIAGNOSIS — I4891 Unspecified atrial fibrillation: Secondary | ICD-10-CM

## 2011-02-01 DIAGNOSIS — Z8679 Personal history of other diseases of the circulatory system: Secondary | ICD-10-CM

## 2011-02-01 LAB — POCT INR: INR: 3.2

## 2011-02-14 ENCOUNTER — Other Ambulatory Visit: Payer: Self-pay | Admitting: Internal Medicine

## 2011-02-14 ENCOUNTER — Ambulatory Visit (INDEPENDENT_AMBULATORY_CARE_PROVIDER_SITE_OTHER): Payer: Medicare Other | Admitting: *Deleted

## 2011-02-14 DIAGNOSIS — I428 Other cardiomyopathies: Secondary | ICD-10-CM

## 2011-02-14 DIAGNOSIS — I4901 Ventricular fibrillation: Secondary | ICD-10-CM

## 2011-02-18 NOTE — Progress Notes (Signed)
icd remote check  

## 2011-02-21 ENCOUNTER — Encounter: Payer: Self-pay | Admitting: *Deleted

## 2011-03-01 ENCOUNTER — Ambulatory Visit (INDEPENDENT_AMBULATORY_CARE_PROVIDER_SITE_OTHER): Payer: Medicare Other | Admitting: *Deleted

## 2011-03-01 DIAGNOSIS — Z8679 Personal history of other diseases of the circulatory system: Secondary | ICD-10-CM

## 2011-03-01 DIAGNOSIS — I4892 Unspecified atrial flutter: Secondary | ICD-10-CM

## 2011-03-01 DIAGNOSIS — I4891 Unspecified atrial fibrillation: Secondary | ICD-10-CM

## 2011-03-01 LAB — POCT INR: INR: 3.3

## 2011-03-05 ENCOUNTER — Encounter: Payer: Self-pay | Admitting: Internal Medicine

## 2011-03-29 ENCOUNTER — Ambulatory Visit (INDEPENDENT_AMBULATORY_CARE_PROVIDER_SITE_OTHER): Payer: Medicare Other | Admitting: *Deleted

## 2011-03-29 DIAGNOSIS — I4891 Unspecified atrial fibrillation: Secondary | ICD-10-CM

## 2011-03-29 DIAGNOSIS — I4892 Unspecified atrial flutter: Secondary | ICD-10-CM

## 2011-03-29 DIAGNOSIS — Z8679 Personal history of other diseases of the circulatory system: Secondary | ICD-10-CM

## 2011-03-29 LAB — POCT INR: INR: 3.7

## 2011-04-07 ENCOUNTER — Other Ambulatory Visit: Payer: Self-pay | Admitting: Internal Medicine

## 2011-04-08 ENCOUNTER — Encounter: Payer: Self-pay | Admitting: Internal Medicine

## 2011-04-19 ENCOUNTER — Ambulatory Visit (INDEPENDENT_AMBULATORY_CARE_PROVIDER_SITE_OTHER): Payer: Medicare Other | Admitting: *Deleted

## 2011-04-19 DIAGNOSIS — I4892 Unspecified atrial flutter: Secondary | ICD-10-CM

## 2011-04-19 DIAGNOSIS — I4891 Unspecified atrial fibrillation: Secondary | ICD-10-CM

## 2011-04-19 DIAGNOSIS — Z8679 Personal history of other diseases of the circulatory system: Secondary | ICD-10-CM

## 2011-04-19 LAB — POCT INR: INR: 1.7

## 2011-05-03 ENCOUNTER — Ambulatory Visit (INDEPENDENT_AMBULATORY_CARE_PROVIDER_SITE_OTHER): Payer: Medicare Other | Admitting: *Deleted

## 2011-05-03 DIAGNOSIS — I4892 Unspecified atrial flutter: Secondary | ICD-10-CM

## 2011-05-03 DIAGNOSIS — Z8679 Personal history of other diseases of the circulatory system: Secondary | ICD-10-CM

## 2011-05-03 DIAGNOSIS — I4891 Unspecified atrial fibrillation: Secondary | ICD-10-CM

## 2011-05-03 LAB — POCT INR: INR: 3

## 2011-05-08 ENCOUNTER — Encounter: Payer: Self-pay | Admitting: Internal Medicine

## 2011-05-08 ENCOUNTER — Ambulatory Visit (INDEPENDENT_AMBULATORY_CARE_PROVIDER_SITE_OTHER): Payer: Medicare Other | Admitting: Internal Medicine

## 2011-05-08 DIAGNOSIS — I428 Other cardiomyopathies: Secondary | ICD-10-CM

## 2011-05-08 DIAGNOSIS — I5023 Acute on chronic systolic (congestive) heart failure: Secondary | ICD-10-CM

## 2011-05-08 DIAGNOSIS — I4891 Unspecified atrial fibrillation: Secondary | ICD-10-CM

## 2011-05-08 DIAGNOSIS — I4892 Unspecified atrial flutter: Secondary | ICD-10-CM

## 2011-05-08 DIAGNOSIS — I4901 Ventricular fibrillation: Secondary | ICD-10-CM

## 2011-05-08 LAB — ICD DEVICE OBSERVATION
AL AMPLITUDE: 5 mv
AL IMPEDENCE ICD: 437.5 Ohm
AL THRESHOLD: 0.75 V
ATRIAL PACING ICD: 61 pct
BAMS-0001: 150 {beats}/min
BAMS-0003: 60 {beats}/min
CHARGE TIME: 9.3 s
DEV-0020ICD: NEGATIVE
DEVICE MODEL ICD: 754815
FVT: 0
HV IMPEDENCE: 50 Ohm
MODE SWITCH EPISODES: 0
PACEART VT: 0
RV LEAD AMPLITUDE: 11.4 mv
RV LEAD IMPEDENCE ICD: 425 Ohm
RV LEAD THRESHOLD: 0.5 V
TOT-0006: 20110104000000
TOT-0007: 1
TOT-0008: 0
TOT-0009: 1
TOT-0010: 8
TZON-0003SLOWVT: 350 ms
TZON-0004SLOWVT: 24
TZON-0005SLOWVT: 6
TZON-0010SLOWVT: 80 ms
VENTRICULAR PACING ICD: 0.01 pct
VF: 0

## 2011-05-08 LAB — T4, FREE: Free T4: 1.17 ng/dL (ref 0.60–1.60)

## 2011-05-08 LAB — HEPATIC FUNCTION PANEL
ALT: 23 U/L (ref 0–35)
AST: 18 U/L (ref 0–37)
Albumin: 3.4 g/dL — ABNORMAL LOW (ref 3.5–5.2)
Alkaline Phosphatase: 99 U/L (ref 39–117)
Bilirubin, Direct: 0 mg/dL (ref 0.0–0.3)
Total Bilirubin: 0.4 mg/dL (ref 0.3–1.2)
Total Protein: 7.9 g/dL (ref 6.0–8.3)

## 2011-05-08 LAB — BASIC METABOLIC PANEL
BUN: 26 mg/dL — ABNORMAL HIGH (ref 6–23)
CO2: 27 mEq/L (ref 19–32)
Calcium: 8.8 mg/dL (ref 8.4–10.5)
Chloride: 107 mEq/L (ref 96–112)
Creatinine, Ser: 1.5 mg/dL — ABNORMAL HIGH (ref 0.4–1.2)
GFR: 43.61 mL/min — ABNORMAL LOW (ref >60.00)
Glucose, Bld: 92 mg/dL (ref 70–99)
Potassium: 4.4 mEq/L (ref 3.5–5.1)
Sodium: 142 mEq/L (ref 135–145)

## 2011-05-08 LAB — TSH: TSH: 1.15 u[IU]/mL (ref 0.35–5.50)

## 2011-05-08 NOTE — Assessment & Plan Note (Signed)
Normal ICD function See Pace Art report No changes today  Continue amiodarone Check TFTs and LFTs today

## 2011-05-08 NOTE — Assessment & Plan Note (Signed)
Mild volume overload on exam with documented weight gain No SOB  Increase lasix to 80mg  BID x 3 days, then resume home regimen BMET today 2 gram sodium restriction Core View turned on today to follow thoracic impedance through ICD  She will keep scheduled follow-up with Dr Glori Luis.

## 2011-05-08 NOTE — Assessment & Plan Note (Addendum)
Controlled with amiodarone Continue coumadin for stroke prevention Stop ASA

## 2011-05-08 NOTE — Progress Notes (Signed)
The patient presents today for routine electrophysiology followup.  Since last being seen in our clinic, the patient reports doing reasonably well.  She reports swelling in his hands and feet and feels that she is retaining fluid.  Today, she denies symptoms of palpitations, chest pain, shortness of breath, orthopnea, PND, dizziness, presyncope, syncope, or neurologic sequela.  The patient feels that she is tolerating medications without difficulties and is otherwise without complaint today.   Past Medical History  Diagnosis Date  . Cardiac arrest - ventricular fibrillation 12/10    with successful resucitation, S/p ICD  . ICD (implantable cardiac defibrillator) in place     she has received appropriate therapy for VF  . Atrial fibrillation or flutter   . Osteopenia   . HTN (hypertension)     moderate  . Nonischemic cardiomyopathy     follow[facility] by Dr [facility]i [facility] at [facility]  . CHF (congestive heart failure)   . Anemia   . S/P colonoscopy    Past Surgical History  Procedure Date  . Abdominal hysterectomy   . Cardiac catheterization   . Cardiac defibrillator placement     by JA for secondary prevention of sudden death    Current Outpatient Prescriptions  Medication Sig Dispense Refill  . amiodarone (PACERONE) 200 MG tablet Take 200 mg by mouth daily.        [name] aspirin (ADULT ASPIRIN EC LOW STRENGTH) 81 MG EC tablet Take 81 mg by mouth daily.        . carvedilol (COREG) 25 MG tablet Take 25 mg by mouth 2 (two) times daily with a meal.        . Cholecalciferol (VITAMIN D3) 1000 UNITS CAPS Take 1 tablet by mouth daily.        . furosemide (LASIX) 40 MG tablet TAKE 2 TABLETS IN THE MORNING AND 1 TABLET IN THE EVENING  90 tablet  2  . lisinopril (PRINIVIL,ZESTRIL) 20 MG tablet Take 20 mg by mouth daily.        . potassium chloride SA (K-DUR,KLOR-CON) 20 MEQ tablet Take 20 mEq by mouth daily.        [name] spironolactone (ALDACTONE) 50 MG tablet Take 1 tablet (50 mg total) by mouth daily.  30 tablet  6   . warfarin (COUMADIN) 2.5 MG tablet USE AS DIRECTED BY ANTICOAGUALTION CLINIC  45 tablet  3    No Known Allergies  History   Social History  . Marital Status: Single    Spouse Name: N/A    Number of Children: 3  . Years of Education: N/A   Occupational History  . Not on file.   Social History Main Topics  . Smoking status: Former Games developer  . Smokeless tobacco: Not on file   Comment: remote history of tobacco abuse  . Alcohol Use: No  . Drug Use: No  . Sexually Active: Not on file   Other Topics Concern  . Not on file   Social History Narrative   Lives with spouse who was recently diagnosed with esophageal ca and is receiving chemotherapy    Family History  Problem Relation Age of Onset  . Stroke Brother     ROS-  All systems are reviewed and are negative except as outlined in the HPI above   Physical Exam: Filed Vitals:   05/08/11 0946  BP: 111/59  Pulse: 60  Height: 5' (1.524 m)  Weight: 168 lb (76.204 kg)    GEN- The patient is well appearing, alert and oriented  x 3 today.   Head- normocephalic, atraumatic Eyes-  Sclera clear, conjunctiva pink Ears- hearing intact Oropharynx- clear Neck- supple, JVP 9cm Lymph- no cervical lymphadenopathy Lungs- Clear to ausculation bilaterally, normal work of breathing Chest- ICD pocket is well healed Heart- Regular rate and rhythm, no murmurs, rubs or gallops, PMI not laterally displaced GI- soft, NT, ND, + BS Extremities- no clubbing, cyanosis, trace edema MS- no significant deformity or atrophy Skin- no rash or lesion Psych- euthymic mood, full affect Neuro- strength and sensation are intact  ICD interrogation- reviewed in detail today,  See PACEART report  Assessment and Plan:

## 2011-05-08 NOTE — Patient Instructions (Signed)
Your physician recommends that you have lab work today: bmp/liver/tsh/t4 (427.1;427.31;428.23).  Your physician has recommended you make the following change in your medication:  1) Stop Aspirin. 2) Increase Furosemide (lasix) to 80mg  one tablet by mouth twice daily for 3 days, then resume your normal dosing.  Remote monitoring is used to monitor your Pacemaker of ICD from home. This monitoring reduces the number of office visits required to check your device to one time per year. It allows [place] to keep an eye on the functioning of your device to ensure it is working properly. You are scheduled for a device check from home on 08/08/11. You may send your transmission at any time that day. If you have a wireless device, the transmission will be sent automatically. After your physician reviews your transmission, you will receive a postcard with your next transmission date.  Your physician wants you to follow-up in: 1 year with Dr. Johney Frame. You will receive a reminder letter in the mail two months in advance. If you don't receive a letter, please call our office to schedule the follow-up appointment.  Dr. Johney Frame recommends that you adhere to a 2 gram sodium diet. Please see the information you were given today.

## 2011-05-20 ENCOUNTER — Encounter: Payer: Self-pay | Admitting: Internal Medicine

## 2011-05-20 ENCOUNTER — Ambulatory Visit (INDEPENDENT_AMBULATORY_CARE_PROVIDER_SITE_OTHER): Payer: Medicare Other | Admitting: Internal Medicine

## 2011-05-20 VITALS — BP 108/62 | HR 60 | Temp 97.7°F | Resp 16 | Wt 171.0 lb

## 2011-05-20 DIAGNOSIS — L259 Unspecified contact dermatitis, unspecified cause: Secondary | ICD-10-CM

## 2011-05-20 MED ORDER — DESONIDE 0.05 % EX CREA: TOPICAL_CREAM | CUTANEOUS | Status: DC

## 2011-05-20 NOTE — Assessment & Plan Note (Signed)
I told her that she has an allergy to the gold and alloys in her earrings and treated her with desowen cream and I gave her pt ed materials as well

## 2011-05-20 NOTE — Patient Instructions (Signed)
Allergic Contact Dermatitis   Allergic contact dermatitis is an itchy, red and scaly rash. It is caused by an allergic reaction to certain substances that touch your skin. The rash often times appears within one to three days after contact.   CAUSES   Poison oak.   Poison ivy.   Chemicals (deodorants, shampoos).   Jewelry.   Latex.   Neomycin in triple antibiotic cream.   SYMPTOMS   In poison ivy or oak, a very itchy rash with tiny blisters may appear. This comes 1-2 days after exposure.   The skin may also appear to be dry with flaking.   There can be marked swelling in some areas such as the eyelids, mouth or genitals.   TREATMENT   It is most important to avoid all contact with the allergic substances. Additional treatment of allergic dermatitis may include:   Burrow's solution soaks to help dry the oozing sores.   Cortisone creams or ointments applied 3-4 times daily. For best results, soak rash area in cool water for 20 minutes. Then apply the medicine. Cover the area with a plastic wrap. You can store the cortisone cream in the refrigerator for a "chilly" effect on your rash. That may decrease itching.   Injections or oral cortisone medicines are needed in more severe cases.   Antihistamine medicine helps ease the itching.   A cool bath can also help stop itching. Try not to scratch the rash.   Medicines that kill germs (antibiotics) may be needed if there is a skin infection present.   DIAGNOSIS   In cases where the cause is uncertain, patch skin tests may be performed to help confirm the identity of the source.   HOME CARE INSTRUCTIONS   Use medications as directed.   Keep the affected area away from anything that would irritate your skin.   If you have poison oak or ivy, the clothing you were wearing at the time of contact should be washed or cleaned. This will get rid of the substance (resin) that caused the allergic reaction.   If you have a pet, the resins may also be present in their fur. They also need  washing with soap and water.   You may also prevent poison oak or ivy by washing exposed skin with soap and water as soon as possible after contact.   As with poison ivy, avoid contact with these plants. Wear protective clothing and gloves when you must be in contact. A barrier cream such as Ivy Block or Shield before exposure will help an outbreak if you bathe within 8 hours of contact.   SEEK MEDICAL CARE IF:   Your condition is not better after 3 days of treatment or you seem to keep getting worse.   You see signs of infection, such as swelling, tenderness, redness or soreness (inflammation), or warmth of affected area.   You have any problems related to medication used.   Document Released: 09/26/2004 Document Re-Released: 11/15/2008   ExitCare® Patient Information ©2011 [employer].

## 2011-05-20 NOTE — Progress Notes (Signed)
  Subjective:    Patient ID: Brianna Hanson, female    DOB: 1935-08-17, 76 y.o.   MRN: [id number]  Rash This is a new problem. The current episode started 1 to 4 weeks ago. The problem is unchanged. The affected locations include the neck. The rash is characterized by itchiness and dryness. Associated with: new gold hoop earrings. Pertinent negatives include no anorexia, congestion, cough, diarrhea, eye pain, facial edema, fatigue, fever, joint pain, rhinorrhea, shortness of breath, sore throat or vomiting. Past treatments include antibiotic cream (neosporin). The treatment provided no relief.      Review of Systems  Constitutional: Negative.  Negative for fever and fatigue.  HENT: Negative.  Negative for congestion, sore throat and rhinorrhea.   Eyes: Negative.  Negative for pain.  Respiratory: Negative.  Negative for cough and shortness of breath.   Cardiovascular: Negative.   Gastrointestinal: Negative.  Negative for vomiting, diarrhea and anorexia.  Genitourinary: Negative.   Musculoskeletal: Negative.  Negative for joint pain.  Skin: Positive for rash. Negative for color change, pallor and wound.  Neurological: Negative.   Hematological: Negative for adenopathy. Does not bruise/bleed easily.  Psychiatric/Behavioral: Negative.        Objective:   Physical Exam  Vitals reviewed. Constitutional: She is oriented to person, place, and time. She appears well-developed and well-nourished. No distress.  HENT:  Head:    Right Ear: Hearing, tympanic membrane, external ear and ear canal normal.  Left Ear: Hearing, tympanic membrane, external ear and ear canal normal.       She has a shiny,scaly,fissuring symmetrical rash below both earlobes and across the neck that fits a pattern consistent with earring exposure  Eyes: Conjunctivae are normal. Right eye exhibits no discharge. Left eye exhibits no discharge. No scleral icterus.  Neck: Normal range of motion. Neck supple. No JVD present.  No tracheal deviation present. No thyromegaly present.  Cardiovascular: Normal rate, regular rhythm, normal heart sounds and intact distal pulses.  Exam reveals no gallop and no friction rub.   No murmur heard. Pulmonary/Chest: Effort normal and breath sounds normal. No stridor. No respiratory distress. She has no wheezes. She has no rales. She exhibits no tenderness.  Abdominal: Soft. Bowel sounds are normal. She exhibits no distension and no mass. There is no tenderness. There is no rebound and no guarding.  Musculoskeletal: Normal range of motion. She exhibits no edema and no tenderness.  Lymphadenopathy:    She has no cervical adenopathy.  Neurological: She is oriented to person, place, and time. She displays normal reflexes. She exhibits normal muscle tone.  Skin: Skin is warm and dry. No rash noted. She is not diaphoretic. No erythema. No pallor.  Psychiatric: She has a normal mood and affect. Her behavior is normal. Judgment and thought content normal.          Assessment & Plan:

## 2011-05-24 ENCOUNTER — Ambulatory Visit (INDEPENDENT_AMBULATORY_CARE_PROVIDER_SITE_OTHER): Payer: Medicare Other | Admitting: *Deleted

## 2011-05-24 DIAGNOSIS — Z8679 Personal history of other diseases of the circulatory system: Secondary | ICD-10-CM

## 2011-05-24 DIAGNOSIS — I4891 Unspecified atrial fibrillation: Secondary | ICD-10-CM

## 2011-05-24 DIAGNOSIS — I4892 Unspecified atrial flutter: Secondary | ICD-10-CM

## 2011-05-24 LAB — POCT INR: INR: 2.8

## 2011-05-31 ENCOUNTER — Other Ambulatory Visit: Payer: Self-pay | Admitting: *Deleted

## 2011-05-31 MED ORDER — AMIODARONE HCL 200 MG PO TABS: 200.0000 mg | ORAL_TABLET | Freq: Every day | ORAL | Status: DC

## 2011-06-14 ENCOUNTER — Ambulatory Visit (INDEPENDENT_AMBULATORY_CARE_PROVIDER_SITE_OTHER): Payer: Medicare Other | Admitting: *Deleted

## 2011-06-14 DIAGNOSIS — Z8679 Personal history of other diseases of the circulatory system: Secondary | ICD-10-CM

## 2011-06-14 DIAGNOSIS — I4891 Unspecified atrial fibrillation: Secondary | ICD-10-CM

## 2011-06-14 DIAGNOSIS — I4892 Unspecified atrial flutter: Secondary | ICD-10-CM

## 2011-06-14 LAB — POCT INR: INR: 3

## 2011-06-24 ENCOUNTER — Other Ambulatory Visit: Payer: Self-pay | Admitting: Internal Medicine

## 2011-07-05 ENCOUNTER — Ambulatory Visit (INDEPENDENT_AMBULATORY_CARE_PROVIDER_SITE_OTHER): Payer: Medicare Other | Admitting: *Deleted

## 2011-07-05 DIAGNOSIS — I5023 Acute on chronic systolic (congestive) heart failure: Secondary | ICD-10-CM

## 2011-07-05 DIAGNOSIS — I4891 Unspecified atrial fibrillation: Secondary | ICD-10-CM

## 2011-07-05 DIAGNOSIS — Z8679 Personal history of other diseases of the circulatory system: Secondary | ICD-10-CM

## 2011-07-05 DIAGNOSIS — I4892 Unspecified atrial flutter: Secondary | ICD-10-CM

## 2011-07-05 LAB — POCT INR: INR: 3

## 2011-07-09 ENCOUNTER — Other Ambulatory Visit: Payer: Self-pay | Admitting: Internal Medicine

## 2011-07-09 MED ORDER — FUROSEMIDE 40 MG PO TABS: 40.0000 mg | ORAL_TABLET | ORAL | Status: DC

## 2011-07-11 ENCOUNTER — Other Ambulatory Visit: Payer: Self-pay | Admitting: Internal Medicine

## 2011-07-11 MED ORDER — FUROSEMIDE 40 MG PO TABS: 40.0000 mg | ORAL_TABLET | ORAL | Status: DC

## 2011-07-11 NOTE — Telephone Encounter (Signed)
Pt calling to check on status of refill request. Please call RX in.

## 2011-07-27 ENCOUNTER — Other Ambulatory Visit: Payer: Self-pay | Admitting: Internal Medicine

## 2011-08-02 ENCOUNTER — Ambulatory Visit (INDEPENDENT_AMBULATORY_CARE_PROVIDER_SITE_OTHER): Payer: Medicare Other | Admitting: *Deleted

## 2011-08-02 DIAGNOSIS — Z8679 Personal history of other diseases of the circulatory system: Secondary | ICD-10-CM

## 2011-08-02 DIAGNOSIS — I4892 Unspecified atrial flutter: Secondary | ICD-10-CM

## 2011-08-02 DIAGNOSIS — I5023 Acute on chronic systolic (congestive) heart failure: Secondary | ICD-10-CM

## 2011-08-02 DIAGNOSIS — I4891 Unspecified atrial fibrillation: Secondary | ICD-10-CM

## 2011-08-02 LAB — POCT INR: INR: 3.4

## 2011-08-05 NOTE — Telephone Encounter (Signed)
Patient called,was told prescription for furosemide 40 mg 2 tablets am, 1 tablet pm was on hold at [address]

## 2011-08-05 NOTE — Telephone Encounter (Signed)
Fu Call: Pt husband calling stating that dosage of furosemide is different from pt previous dosage instructions. Pt was taking 80 mg in the AM and 40 mg in the PM. Please return pt call to discuss further and clarify dosage instructions.

## 2011-08-07 ENCOUNTER — Telehealth: Payer: Self-pay | Admitting: Internal Medicine

## 2011-08-07 NOTE — Telephone Encounter (Signed)
[facility] [address] calling re clarification on  dosage to take as directed, pt doesn't know what she was told , on furosimide

## 2011-08-08 ENCOUNTER — Ambulatory Visit (INDEPENDENT_AMBULATORY_CARE_PROVIDER_SITE_OTHER): Payer: Medicare Other | Admitting: *Deleted

## 2011-08-08 DIAGNOSIS — I428 Other cardiomyopathies: Secondary | ICD-10-CM

## 2011-08-08 DIAGNOSIS — I4891 Unspecified atrial fibrillation: Secondary | ICD-10-CM

## 2011-08-08 DIAGNOSIS — I4892 Unspecified atrial flutter: Secondary | ICD-10-CM

## 2011-08-09 ENCOUNTER — Other Ambulatory Visit: Payer: Self-pay | Admitting: Internal Medicine

## 2011-08-09 ENCOUNTER — Encounter: Payer: Self-pay | Admitting: Internal Medicine

## 2011-08-09 LAB — REMOTE ICD DEVICE
AL AMPLITUDE: 4.2 mv
AL IMPEDENCE ICD: 400 Ohm
ATRIAL PACING ICD: 61 pct
BAMS-0001: 150 {beats}/min
BAMS-0003: 60 {beats}/min
DEV-0020ICD: NEGATIVE
DEVICE MODEL ICD: 754815
HV IMPEDENCE: 51 Ohm
RV LEAD AMPLITUDE: 11.4 mv
RV LEAD IMPEDENCE ICD: 430 Ohm
TZON-0003SLOWVT: 350 ms
TZON-0004SLOWVT: 24
TZON-0005SLOWVT: 6
TZON-0010SLOWVT: 80 ms
VENTRICULAR PACING ICD: 1 pct

## 2011-08-20 NOTE — Progress Notes (Signed)
Remote icd check  

## 2011-08-23 ENCOUNTER — Ambulatory Visit (INDEPENDENT_AMBULATORY_CARE_PROVIDER_SITE_OTHER): Payer: Medicare Other | Admitting: *Deleted

## 2011-08-23 DIAGNOSIS — I4891 Unspecified atrial fibrillation: Secondary | ICD-10-CM

## 2011-08-23 DIAGNOSIS — Z8679 Personal history of other diseases of the circulatory system: Secondary | ICD-10-CM

## 2011-08-23 DIAGNOSIS — I5023 Acute on chronic systolic (congestive) heart failure: Secondary | ICD-10-CM

## 2011-08-23 DIAGNOSIS — I4892 Unspecified atrial flutter: Secondary | ICD-10-CM

## 2011-08-23 LAB — POCT INR: INR: 2.1

## 2011-08-24 ENCOUNTER — Other Ambulatory Visit: Payer: Self-pay | Admitting: Cardiovascular Disease

## 2011-08-29 ENCOUNTER — Encounter: Payer: Self-pay | Admitting: *Deleted

## 2011-09-20 ENCOUNTER — Encounter: Payer: Medicare Other | Admitting: *Deleted

## 2011-09-23 ENCOUNTER — Ambulatory Visit (INDEPENDENT_AMBULATORY_CARE_PROVIDER_SITE_OTHER): Payer: Medicare Other | Admitting: *Deleted

## 2011-09-23 DIAGNOSIS — I4892 Unspecified atrial flutter: Secondary | ICD-10-CM | POA: Diagnosis not present

## 2011-09-23 DIAGNOSIS — I4891 Unspecified atrial fibrillation: Secondary | ICD-10-CM

## 2011-09-23 DIAGNOSIS — Z8679 Personal history of other diseases of the circulatory system: Secondary | ICD-10-CM | POA: Diagnosis not present

## 2011-09-23 LAB — POCT INR: INR: 2.3

## 2011-09-26 ENCOUNTER — Other Ambulatory Visit: Payer: Self-pay | Admitting: Cardiovascular Disease

## 2011-10-18 ENCOUNTER — Ambulatory Visit (INDEPENDENT_AMBULATORY_CARE_PROVIDER_SITE_OTHER): Payer: Medicare Other | Admitting: Pharmacist

## 2011-10-18 DIAGNOSIS — I4891 Unspecified atrial fibrillation: Secondary | ICD-10-CM | POA: Diagnosis not present

## 2011-10-18 DIAGNOSIS — I4892 Unspecified atrial flutter: Secondary | ICD-10-CM | POA: Diagnosis not present

## 2011-10-18 DIAGNOSIS — Z8679 Personal history of other diseases of the circulatory system: Secondary | ICD-10-CM

## 2011-10-18 DIAGNOSIS — I5023 Acute on chronic systolic (congestive) heart failure: Secondary | ICD-10-CM

## 2011-10-18 LAB — POCT INR: INR: 2.3

## 2011-10-28 ENCOUNTER — Other Ambulatory Visit: Payer: Self-pay | Admitting: *Deleted

## 2011-10-30 ENCOUNTER — Other Ambulatory Visit: Payer: Self-pay | Admitting: *Deleted

## 2011-11-08 DIAGNOSIS — Z1231 Encounter for screening mammogram for malignant neoplasm of breast: Secondary | ICD-10-CM | POA: Diagnosis not present

## 2011-11-11 ENCOUNTER — Telehealth: Payer: Self-pay | Admitting: Internal Medicine

## 2011-11-11 MED ORDER — FUROSEMIDE 40 MG PO TABS: ORAL_TABLET | ORAL | Status: DC

## 2011-11-11 NOTE — Telephone Encounter (Signed)
New msg Pt wants refill of furosemide 40 mg. Pt said she takes 3 pills a day she takes 80mg  in morning and 40 mg in afternoon. She [facility] on [address] 90 day supply. Please verify dosage

## 2011-11-11 NOTE — Telephone Encounter (Signed)
..   Requested Prescriptions   Signed Prescriptions Disp Refills  . furosemide (LASIX) 40 MG tablet 270 tablet 4    Sig: Take 2 tabs in the morning and 1 tab in the evening for a total of 3 tabs a day    Authorizing Provider: Hillis Range    Ordering User: Nation Cradle M  refill per Tresa Endo

## 2011-11-14 ENCOUNTER — Ambulatory Visit (INDEPENDENT_AMBULATORY_CARE_PROVIDER_SITE_OTHER): Payer: Medicare Other | Admitting: *Deleted

## 2011-11-14 ENCOUNTER — Encounter: Payer: Self-pay | Admitting: Internal Medicine

## 2011-11-14 DIAGNOSIS — I4892 Unspecified atrial flutter: Secondary | ICD-10-CM

## 2011-11-14 DIAGNOSIS — I4901 Ventricular fibrillation: Secondary | ICD-10-CM | POA: Diagnosis not present

## 2011-11-14 DIAGNOSIS — I4891 Unspecified atrial fibrillation: Secondary | ICD-10-CM | POA: Diagnosis not present

## 2011-11-14 LAB — REMOTE ICD DEVICE
AL AMPLITUDE: 3.4 mv
AL IMPEDENCE ICD: 400 Ohm
BAMS-0001: 150 {beats}/min
BAMS-0003: 60 {beats}/min
DEV-0020ICD: NEGATIVE
DEVICE MODEL ICD: 754815
HV IMPEDENCE: 52 Ohm
RV LEAD AMPLITUDE: 11.4 mv
RV LEAD IMPEDENCE ICD: 430 Ohm
TZON-0003SLOWVT: 350 ms
TZON-0004SLOWVT: 24
TZON-0005SLOWVT: 6
TZON-0010SLOWVT: 80 ms

## 2011-11-19 ENCOUNTER — Encounter: Payer: Self-pay | Admitting: *Deleted

## 2011-11-21 ENCOUNTER — Other Ambulatory Visit: Payer: Self-pay | Admitting: *Deleted

## 2011-11-21 MED ORDER — WARFARIN SODIUM 2.5 MG PO TABS: ORAL_TABLET | ORAL | Status: DC

## 2011-11-22 ENCOUNTER — Ambulatory Visit (INDEPENDENT_AMBULATORY_CARE_PROVIDER_SITE_OTHER): Payer: Medicare Other

## 2011-11-22 DIAGNOSIS — Z8679 Personal history of other diseases of the circulatory system: Secondary | ICD-10-CM | POA: Diagnosis not present

## 2011-11-22 DIAGNOSIS — I4891 Unspecified atrial fibrillation: Secondary | ICD-10-CM

## 2011-11-22 DIAGNOSIS — I5023 Acute on chronic systolic (congestive) heart failure: Secondary | ICD-10-CM

## 2011-11-22 DIAGNOSIS — I4892 Unspecified atrial flutter: Secondary | ICD-10-CM

## 2011-11-22 LAB — POCT INR: INR: 2

## 2011-11-22 NOTE — Progress Notes (Signed)
Remote defib check  

## 2011-11-27 DIAGNOSIS — Z8674 Personal history of sudden cardiac arrest: Secondary | ICD-10-CM | POA: Diagnosis not present

## 2011-11-27 DIAGNOSIS — I4891 Unspecified atrial fibrillation: Secondary | ICD-10-CM | POA: Diagnosis not present

## 2011-11-27 DIAGNOSIS — I428 Other cardiomyopathies: Secondary | ICD-10-CM | POA: Diagnosis not present

## 2011-11-27 DIAGNOSIS — I509 Heart failure, unspecified: Secondary | ICD-10-CM | POA: Diagnosis not present

## 2011-11-27 DIAGNOSIS — E039 Hypothyroidism, unspecified: Secondary | ICD-10-CM | POA: Diagnosis not present

## 2011-11-27 DIAGNOSIS — E559 Vitamin D deficiency, unspecified: Secondary | ICD-10-CM | POA: Diagnosis not present

## 2011-11-27 DIAGNOSIS — I2589 Other forms of chronic ischemic heart disease: Secondary | ICD-10-CM | POA: Diagnosis not present

## 2011-11-27 DIAGNOSIS — Z79899 Other long term (current) drug therapy: Secondary | ICD-10-CM | POA: Diagnosis not present

## 2011-12-20 ENCOUNTER — Other Ambulatory Visit: Payer: Self-pay | Admitting: *Deleted

## 2011-12-20 MED ORDER — AMIODARONE HCL 200 MG PO TABS: 200.0000 mg | ORAL_TABLET | Freq: Every day | ORAL | Status: DC

## 2011-12-20 NOTE — Telephone Encounter (Signed)
Refilled amiodorone 

## 2012-01-03 ENCOUNTER — Ambulatory Visit (INDEPENDENT_AMBULATORY_CARE_PROVIDER_SITE_OTHER): Payer: Medicare Other | Admitting: *Deleted

## 2012-01-03 DIAGNOSIS — I5023 Acute on chronic systolic (congestive) heart failure: Secondary | ICD-10-CM

## 2012-01-03 DIAGNOSIS — I4891 Unspecified atrial fibrillation: Secondary | ICD-10-CM

## 2012-01-03 DIAGNOSIS — Z8679 Personal history of other diseases of the circulatory system: Secondary | ICD-10-CM

## 2012-01-03 DIAGNOSIS — I4892 Unspecified atrial flutter: Secondary | ICD-10-CM | POA: Diagnosis not present

## 2012-01-03 LAB — POCT INR: INR: 2.3

## 2012-01-20 ENCOUNTER — Other Ambulatory Visit: Payer: Self-pay | Admitting: Cardiology

## 2012-01-20 MED ORDER — AMIODARONE HCL 200 MG PO TABS: 200.0000 mg | ORAL_TABLET | Freq: Every day | ORAL | Status: DC

## 2012-01-21 ENCOUNTER — Other Ambulatory Visit: Payer: Self-pay | Admitting: Pharmacist

## 2012-01-21 MED ORDER — WARFARIN SODIUM 2.5 MG PO TABS: ORAL_TABLET | ORAL | Status: DC

## 2012-01-22 ENCOUNTER — Other Ambulatory Visit: Payer: Self-pay | Admitting: Internal Medicine

## 2012-01-22 MED ORDER — FUROSEMIDE 40 MG PO TABS: ORAL_TABLET | ORAL | Status: DC

## 2012-01-28 ENCOUNTER — Emergency Department (HOSPITAL_COMMUNITY): Payer: Medicare Other

## 2012-01-28 ENCOUNTER — Emergency Department (HOSPITAL_COMMUNITY)
Admission: EM | Admit: 2012-01-28 | Discharge: 2012-01-28 | Disposition: A | Discharge status: Home / Self Care | Payer: Medicare Other | Attending: Emergency Medicine | Admitting: Emergency Medicine

## 2012-01-28 ENCOUNTER — Encounter (HOSPITAL_COMMUNITY): Payer: Self-pay | Admitting: *Deleted

## 2012-01-28 DIAGNOSIS — R079 Chest pain, unspecified: Secondary | ICD-10-CM | POA: Diagnosis not present

## 2012-01-28 DIAGNOSIS — I10 Essential (primary) hypertension: Secondary | ICD-10-CM | POA: Diagnosis not present

## 2012-01-28 DIAGNOSIS — I517 Cardiomegaly: Secondary | ICD-10-CM | POA: Diagnosis not present

## 2012-01-28 DIAGNOSIS — R109 Unspecified abdominal pain: Secondary | ICD-10-CM | POA: Diagnosis not present

## 2012-01-28 DIAGNOSIS — R110 Nausea: Secondary | ICD-10-CM | POA: Diagnosis not present

## 2012-01-28 DIAGNOSIS — Z79899 Other long term (current) drug therapy: Secondary | ICD-10-CM | POA: Diagnosis not present

## 2012-01-28 DIAGNOSIS — I509 Heart failure, unspecified: Secondary | ICD-10-CM | POA: Insufficient documentation

## 2012-01-28 DIAGNOSIS — R1031 Right lower quadrant pain: Secondary | ICD-10-CM | POA: Diagnosis not present

## 2012-01-28 DIAGNOSIS — I4891 Unspecified atrial fibrillation: Secondary | ICD-10-CM | POA: Diagnosis not present

## 2012-01-28 DIAGNOSIS — K8020 Calculus of gallbladder without cholecystitis without obstruction: Secondary | ICD-10-CM | POA: Diagnosis not present

## 2012-01-28 LAB — COMPREHENSIVE METABOLIC PANEL
ALT: 21 U/L (ref 0–35)
AST: 20 U/L (ref 0–37)
Albumin: 3.1 g/dL — ABNORMAL LOW (ref 3.5–5.2)
Alkaline Phosphatase: 104 U/L (ref 39–117)
BUN: 43 mg/dL — ABNORMAL HIGH (ref 6–23)
CO2: 24 mEq/L (ref 19–32)
Calcium: 9.5 mg/dL (ref 8.4–10.5)
Chloride: 105 mEq/L (ref 96–112)
Creatinine, Ser: 2.02 mg/dL — ABNORMAL HIGH (ref 0.50–1.10)
GFR calc Af Amer: 27 mL/min — ABNORMAL LOW (ref >90)
GFR calc non Af Amer: 23 mL/min — ABNORMAL LOW (ref >90)
Glucose, Bld: 110 mg/dL — ABNORMAL HIGH (ref 70–99)
Potassium: 5.2 mEq/L — ABNORMAL HIGH (ref 3.5–5.1)
Sodium: 140 mEq/L (ref 135–145)
Total Bilirubin: 0.2 mg/dL — ABNORMAL LOW (ref 0.3–1.2)
Total Protein: 8.2 g/dL (ref 6.0–8.3)

## 2012-01-28 LAB — DIFFERENTIAL
Basophils Absolute: 0 10*3/uL (ref 0.0–0.1)
Basophils Relative: 0 % (ref 0–1)
Eosinophils Absolute: 0.2 10*3/uL (ref 0.0–0.7)
Eosinophils Relative: 2 % (ref 0–5)
Lymphocytes Relative: 23 % (ref 12–46)
Lymphs Abs: 2 10*3/uL (ref 0.7–4.0)
Monocytes Absolute: 0.8 10*3/uL (ref 0.1–1.0)
Monocytes Relative: 9 % (ref 3–12)
Neutro Abs: 5.5 10*3/uL (ref 1.7–7.7)
Neutrophils Relative %: 65 % (ref 43–77)

## 2012-01-28 LAB — CBC
HCT: 32.6 % — ABNORMAL LOW (ref 36.0–46.0)
Hemoglobin: 10.5 g/dL — ABNORMAL LOW (ref 12.0–15.0)
MCH: 29.2 pg (ref 26.0–34.0)
MCHC: 32.2 g/dL (ref 30.0–36.0)
MCV: 90.6 fL (ref 78.0–100.0)
Platelets: 221 10*3/uL (ref 150–400)
RBC: 3.6 MIL/uL — ABNORMAL LOW (ref 3.87–5.11)
RDW: 17.3 % — ABNORMAL HIGH (ref 11.5–15.5)
WBC: 8.4 10*3/uL (ref 4.0–10.5)

## 2012-01-28 LAB — POCT I-STAT TROPONIN I: Troponin i, poc: 0 ng/mL (ref 0.00–0.08)

## 2012-01-28 LAB — LIPASE, BLOOD: Lipase: 48 U/L (ref 11–59)

## 2012-01-28 MED ORDER — IOHEXOL 300 MG/ML  SOLN
20.0000 mL | INTRAMUSCULAR | Status: AC
  Administered 2012-01-28: 20 mL via ORAL

## 2012-01-28 MED ORDER — HYDROCODONE-ACETAMINOPHEN 5-500 MG PO TABS: 1.0000 | ORAL_TABLET | Freq: Four times a day (QID) | ORAL | Status: AC | PRN

## 2012-01-28 NOTE — ED Provider Notes (Signed)
History     CSN: [id number]  Arrival date & time 01/28/12  1335   First MD Initiated Contact with Patient 01/28/12 1554      Chief Complaint  Patient presents with  . Abdominal Pain    (Consider location/radiation/quality/duration/timing/severity/associated sxs/prior treatment) Patient is a 77 y.o. female presenting with abdominal pain. The history is provided by the patient.  Abdominal Pain The primary symptoms of the illness include abdominal pain. The primary symptoms of the illness do not include fever, nausea, vomiting, diarrhea or dysuria. Episode onset: 3 hours ago. The onset of the illness was sudden. The problem has been gradually worsening.  Associated with: nothing, started while watching tv. The patient has not had a change in bowel habit. Symptoms associated with the illness do not include chills, urgency, frequency or back pain.    Past Medical History  Diagnosis Date  . Cardiac arrest - ventricular fibrillation 12/10    with successful resucitation, S/p ICD  . ICD (implantable cardiac defibrillator) in place     she has received appropriate therapy for VF  . Atrial fibrillation or flutter   . Osteopenia   . HTN (hypertension)     moderate  . Nonischemic cardiomyopathy     [facility]i [facility]  . CHF (congestive heart failure)   . Anemia   . S/P colonoscopy     Past Surgical History  Procedure Date  . Abdominal hysterectomy   . Cardiac catheterization   . Cardiac defibrillator placement     by JA for secondary prevention of sudden death    Family History  Problem Relation Age of Onset  . Stroke Brother     History  Substance Use Topics  . Smoking status: Former Games developer  . Smokeless tobacco: Never Used   Comment: remote history of tobacco abuse  . Alcohol Use: No    OB History    Grav Para Term Preterm Abortions TAB SAB Ect Mult Living                  Review of Systems  Constitutional: Negative for fever and chills.    Gastrointestinal: Positive for abdominal pain. Negative for nausea, vomiting and diarrhea.  Genitourinary: Negative for dysuria, urgency and frequency.  Musculoskeletal: Negative for back pain.  All other systems reviewed and are negative.    Allergies  Review of patient's allergies indicates no known allergies.  Home Medications   Current Outpatient Rx  Name Route Sig Dispense Refill  . AMIODARONE HCL 200 MG PO TABS Oral Take 1 tablet (200 mg total) by mouth daily. 30 tablet 6  . CARVEDILOL 25 MG PO TABS  TAKE 1 & 1/2 TABLETS BY MOUTH TWICE DAILY 90 tablet 4  . VITAMIN D3 1000 UNITS PO CAPS Oral Take 1 tablet by mouth daily.      . DESONIDE 0.05 % EX CREA Topical Apply 1 application topically 2 (two) times daily as needed. For skin allergy    . FUROSEMIDE 40 MG PO TABS Oral Take 40-80 mg by mouth 2 (two) times daily. Take 2 tabs in the morning and 1 tab in the evening for a total of 3 tabs a day    . LISINOPRIL 20 MG PO TABS  TAKE 1 TABLET EVERY DAY 30 tablet 11  . POTASSIUM CHLORIDE CRYS ER 20 MEQ PO TBCR Oral Take 20 mEq by mouth daily.      [name] SPIRONOLACTONE 50 MG PO TABS Oral Take 1 tablet (50 mg  total) by mouth daily. 30 tablet 6  . WARFARIN SODIUM 2.5 MG PO TABS  Take as directed per coumadin clinic 45 tablet 3    BP 123/62  Pulse 60  Temp 97.8 F (36.6 C)  Resp 18  SpO2 99%  Physical Exam  Nursing note and vitals reviewed. Constitutional: She is oriented to person, place, and time. She appears well-developed and well-nourished. No distress.  HENT:  Head: Normocephalic and atraumatic.  Neck: Normal range of motion. Neck supple.  Cardiovascular: Normal rate and regular rhythm.  Exam reveals no gallop and no friction rub.   No murmur heard. Pulmonary/Chest: Effort normal and breath sounds normal. No respiratory distress. She has no wheezes.  Abdominal: Soft. Bowel sounds are normal. She exhibits no distension. There is tenderness.       ttp in the rlq, no rebound or  guarding.  Bowel sounds normoactive.  Musculoskeletal: Normal range of motion.  Neurological: She is alert and oriented to person, place, and time.  Skin: Skin is warm and dry. She is not diaphoretic.    ED Course  Procedures (including critical care time)  Labs Reviewed  CBC - Abnormal; Notable for the following:    RBC 3.60 (*)    Hemoglobin 10.5 (*)    HCT 32.6 (*)    RDW 17.3 (*)    All other components within normal limits  COMPREHENSIVE METABOLIC PANEL - Abnormal; Notable for the following:    Potassium 5.2 (*)    Glucose, Bld 110 (*)    BUN 43 (*)    Creatinine, Ser 2.02 (*)    Albumin 3.1 (*)    Total Bilirubin 0.2 (*)    GFR calc non Af Amer 23 (*)    GFR calc Af Amer 27 (*)    All other components within normal limits  DIFFERENTIAL  POCT I-STAT TROPONIN I  LIPASE, BLOOD   Dg Chest 2 View  01/28/2012  *RADIOLOGY REPORT*  Clinical Data: Chest pain.  CHEST - 2 VIEW  Comparison: 05/04/2010  Findings: Left AICD remains in place, unchanged.  Cardiomegaly. Lungs are clear.  No effusions.  No acute bony abnormality.  IMPRESSION: Cardiomegaly.  No active disease.  Original Report Authenticated By: Cyndie Chime, M.D.     No diagnosis found.    MDM  The patient presents with pain in the right side of the abdomen.  The labs are reassuring and the ct scan does not reveal appendicitis.  There are gallstones that may be contributing to her symptoms as I have not found another cause.  At this point, I will discharge her to home with pain meds, surgical follow up.        Geoffery Lyons, MD 01/28/12 [phone]

## 2012-01-28 NOTE — Discharge Instructions (Signed)
Abdominal Pain Abdominal pain can be caused by many things. Your caregiver decides the seriousness of your pain by an examination and possibly blood tests and X-rays. Many cases can be observed and treated at home. Most abdominal pain is not caused by a disease and will probably improve without treatment. However, in many cases, more time must pass before a clear cause of the pain can be found. Before that point, it may not be known if you need more testing, or if hospitalization or surgery is needed. HOME CARE INSTRUCTIONS   Do not take laxatives unless directed by your caregiver.   Take pain medicine only as directed by your caregiver.   Only take over-the-counter or prescription medicines for pain, discomfort, or fever as directed by your caregiver.   Try a clear liquid diet (broth, tea, or water) for as long as directed by your caregiver. Slowly move to a bland diet as tolerated.  SEEK IMMEDIATE MEDICAL CARE IF:   The pain does not go away.   You have a fever.   You keep throwing up (vomiting).   The pain is felt only in portions of the abdomen. Pain in the right side could possibly be appendicitis. In an adult, pain in the left lower portion of the abdomen could be colitis or diverticulitis.   You pass bloody or black tarry stools.  MAKE SURE YOU:   Understand these instructions.   Will watch your condition.   Will get help right away if you are not doing well or get worse.  Document Released: 05/29/2005 Document Revised: 08/08/2011 Document Reviewed: 04/06/2008 [facility] Patient Information 2012 [place], [place].Gallstones Gallstones are a form of gallbladder disease. The gallbladder is a small organ that helps you digest food.  HOME CARE  Only take medicine as told by your doctor.   Eat a low-fat diet.   Follow up as told.  GET HELP RIGHT AWAY IF:   Your pain gets worse.   You develop yellow skin or eyes (jaundice).   The pain moves to another part of your belly  (abdomen) or back.   You have a temperature by mouth above 102 F (38.9 C), not controlled by medicine.   You feel sick to your stomach (nauseous) and throw up (vomit).  MAKE SURE YOU:   Understand these instructions.   Will watch your condition.   Will get help right away if you are not doing well or get worse.  Document Released: 02/05/2008 Document Revised: 08/08/2011 Document Reviewed: 07/25/2009 [facility] Patient Information 2012 [place], [place].

## 2012-01-28 NOTE — ED Notes (Signed)
Called CT to inform pt. Finished with contrast.

## 2012-01-28 NOTE — ED Notes (Signed)
Pt. States that she was watching tv when she had a sudden sharp pain across top of abdomen, directly under breast line. States that she felt nauseous and lightheaded and severe pain for 15-20 minutes. Denies vomiting, sweating, and diarrhea. Symptoms have since resolved.  Pulses strong, abdomen soft to palpitation, slightly tender midepigastric area. Pt. Has no other complaints at this time.

## 2012-01-28 NOTE — ED Notes (Signed)
Prescription given with discharge instruction

## 2012-01-28 NOTE — ED Notes (Signed)
Pt states she hurts in the center of her back

## 2012-01-28 NOTE — ED Notes (Signed)
Pt ambulated to restroom with steady gait.  Pt providing CCUS.  Placing at bedside.

## 2012-01-28 NOTE — ED Notes (Signed)
Pt was sitting watching tv and started cramping across upper abdomen.    No chest pain or sob but felt like she was going to pass out

## 2012-02-03 ENCOUNTER — Ambulatory Visit (INDEPENDENT_AMBULATORY_CARE_PROVIDER_SITE_OTHER): Payer: Medicare Other | Admitting: Internal Medicine

## 2012-02-03 ENCOUNTER — Other Ambulatory Visit (INDEPENDENT_AMBULATORY_CARE_PROVIDER_SITE_OTHER): Payer: Medicare Other

## 2012-02-03 ENCOUNTER — Encounter: Payer: Self-pay | Admitting: Internal Medicine

## 2012-02-03 ENCOUNTER — Other Ambulatory Visit: Payer: Self-pay | Admitting: Cardiology

## 2012-02-03 VITALS — BP 120/68 | HR 60 | Temp 97.0°F | Resp 16 | Wt 171.5 lb

## 2012-02-03 DIAGNOSIS — R7309 Other abnormal glucose: Secondary | ICD-10-CM

## 2012-02-03 DIAGNOSIS — D649 Anemia, unspecified: Secondary | ICD-10-CM

## 2012-02-03 DIAGNOSIS — N182 Chronic kidney disease, stage 2 (mild): Secondary | ICD-10-CM

## 2012-02-03 DIAGNOSIS — I10 Essential (primary) hypertension: Secondary | ICD-10-CM

## 2012-02-03 DIAGNOSIS — Z23 Encounter for immunization: Secondary | ICD-10-CM | POA: Diagnosis not present

## 2012-02-03 DIAGNOSIS — K8020 Calculus of gallbladder without cholecystitis without obstruction: Secondary | ICD-10-CM

## 2012-02-03 DIAGNOSIS — N179 Acute kidney failure, unspecified: Secondary | ICD-10-CM

## 2012-02-03 DIAGNOSIS — D510 Vitamin B12 deficiency anemia due to intrinsic factor deficiency: Secondary | ICD-10-CM

## 2012-02-03 DIAGNOSIS — I4891 Unspecified atrial fibrillation: Secondary | ICD-10-CM

## 2012-02-03 LAB — COMPREHENSIVE METABOLIC PANEL
ALT: 19 U/L (ref 0–35)
AST: 18 U/L (ref 0–37)
Albumin: 3.3 g/dL — ABNORMAL LOW (ref 3.5–5.2)
Alkaline Phosphatase: 86 U/L (ref 39–117)
BUN: 63 mg/dL — ABNORMAL HIGH (ref 6–23)
CO2: 25 mEq/L (ref 19–32)
Calcium: 9.6 mg/dL (ref 8.4–10.5)
Chloride: 105 mEq/L (ref 96–112)
Creatinine, Ser: 2.9 mg/dL — ABNORMAL HIGH (ref 0.4–1.2)
GFR: 20.18 mL/min — ABNORMAL LOW (ref >60.00)
Glucose, Bld: 103 mg/dL — ABNORMAL HIGH (ref 70–99)
Potassium: 6 mEq/L — ABNORMAL HIGH (ref 3.5–5.1)
Sodium: 139 mEq/L (ref 135–145)
Total Bilirubin: 0.4 mg/dL (ref 0.3–1.2)
Total Protein: 8.2 g/dL (ref 6.0–8.3)

## 2012-02-03 LAB — URINALYSIS, ROUTINE W REFLEX MICROSCOPIC
Bilirubin Urine: NEGATIVE
Hgb urine dipstick: NEGATIVE
Ketones, ur: NEGATIVE
Nitrite: NEGATIVE
Specific Gravity, Urine: 1.01 (ref 1.000–1.030)
Total Protein, Urine: NEGATIVE
Urine Glucose: NEGATIVE
Urobilinogen, UA: 0.2 (ref 0.0–1.0)
pH: 6 (ref 5.0–8.0)

## 2012-02-03 LAB — HEMOGLOBIN A1C: Hgb A1c MFr Bld: 6.1 % (ref 4.6–6.5)

## 2012-02-03 LAB — CBC WITH DIFFERENTIAL/PLATELET
Basophils Absolute: 0 10*3/uL (ref 0.0–0.1)
Basophils Relative: 0.3 % (ref 0.0–3.0)
Eosinophils Absolute: 0.1 10*3/uL (ref 0.0–0.7)
Eosinophils Relative: 1.3 % (ref 0.0–5.0)
HCT: 33.3 % — ABNORMAL LOW (ref 36.0–46.0)
Hemoglobin: 10.9 g/dL — ABNORMAL LOW (ref 12.0–15.0)
Lymphocytes Relative: 24.6 % (ref 12.0–46.0)
Lymphs Abs: 1.9 10*3/uL (ref 0.7–4.0)
MCHC: 32.7 g/dL (ref 30.0–36.0)
MCV: 89.3 fl (ref 78.0–100.0)
Monocytes Absolute: 0.9 10*3/uL (ref 0.1–1.0)
Monocytes Relative: 11.4 % (ref 3.0–12.0)
Neutro Abs: 4.9 10*3/uL (ref 1.4–7.7)
Neutrophils Relative %: 62.4 % (ref 43.0–77.0)
Platelets: 247 10*3/uL (ref 150.0–400.0)
RBC: 3.74 Mil/uL — ABNORMAL LOW (ref 3.87–5.11)
RDW: 17.3 % — ABNORMAL HIGH (ref 11.5–14.6)
WBC: 7.8 10*3/uL (ref 4.5–10.5)

## 2012-02-03 LAB — FERRITIN: Ferritin: 49.1 ng/mL (ref 10.0–291.0)

## 2012-02-03 LAB — FOLATE: Folate: 19.8 ng/mL (ref >5.9)

## 2012-02-03 LAB — IBC PANEL
Iron: 62 ug/dL (ref 42–145)
Saturation Ratios: 20.4 % (ref 20.0–50.0)
Transferrin: 216.7 mg/dL (ref 212.0–360.0)

## 2012-02-03 LAB — VITAMIN B12: Vitamin B-12: 431 pg/mL (ref 211–911)

## 2012-02-03 LAB — TSH: TSH: 2.23 u[IU]/mL (ref 0.35–5.50)

## 2012-02-03 MED ORDER — CARVEDILOL 25 MG PO TABS: 25.0000 mg | ORAL_TABLET | Freq: Two times a day (BID) | ORAL | Status: DC

## 2012-02-03 NOTE — Assessment & Plan Note (Signed)
I will recheck her CBC today and will look at some vitamin levels as well

## 2012-02-03 NOTE — Assessment & Plan Note (Signed)
She has good rate and rhythm control today 

## 2012-02-03 NOTE — Progress Notes (Signed)
Addended by: Rock Nephew T on: 02/03/2012 04:54 PM   Modules accepted: Orders

## 2012-02-03 NOTE — Assessment & Plan Note (Signed)
I will check her a1c to see if she has developed DM II 

## 2012-02-03 NOTE — Patient Instructions (Signed)
Cholelithiasis Cholelithiasis (also called gallstones) is a form of gallbladder disease where gallstones form in your gallbladder. The gallbladder is a non-essential organ that stores bile made in the liver, which helps digest fats. Gallstones begin as small crystals and slowly grow into stones. Gallstone pain occurs when the gallbladder spasms, and a gallstone is blocking the duct. Pain can also occur when a stone passes out of the duct.  Women are more likely to develop gallstones than men. Other factors that increase the risk of gallbladder disease are:  Having multiple pregnancies. Physicians sometimes advise removing diseased gallbladders before future pregnancies.   Obesity.   Diets heavy in fried foods and fat.   Increasing age (older than 60).   Prolonged use of medications containing female hormones.   Diabetes mellitus.   Rapid weight loss.   Family history of gallstones (heredity).  SYMPTOMS  Feeling sick to your stomach (nauseous).   Abdominal pain.   Yellowing of the skin (jaundice).   Sudden pain. It may persist from several minutes to several hours.   Worsening pain with deep breathing or when jarred.   Fever.   Tenderness to the touch.  In some cases, when gallstones do not move into the bile duct, people have no pain or symptoms. These are called "silent" gallstones. TREATMENT In severe cases, emergency surgery may be required. HOME CARE INSTRUCTIONS   Only take over-the-counter or prescription medicines for pain, discomfort, or fever as directed by your caregiver.   Follow a low-fat diet until seen again. Fat causes the gallbladder to contract, which can result in pain.   Follow up as instructed. Attacks are almost always recurrent and surgery is usually required for permanent treatment.  SEEK IMMEDIATE MEDICAL CARE IF:   Your pain increases and is not controlled by medications.   You have an oral temperature above 102 F (38.9 C), not controlled by  medication.   You develop nausea and vomiting.  MAKE SURE YOU:   Understand these instructions.   Will watch your condition.   Will get help right away if you are not doing well or get worse.  Document Released: 08/15/2005 Document Revised: 08/08/2011 Document Reviewed: 10/18/2010 [employer] Patient Information 2012 [employer]. 

## 2012-02-03 NOTE — Assessment & Plan Note (Signed)
General surgery referral to consider cholecystectomy

## 2012-02-03 NOTE — Progress Notes (Signed)
Subjective:    Patient ID: Brianna Hanson, female    DOB: 1935/08/14, 77 y.o.   MRN: [id number]  Abdominal Pain This is a recurrent problem. The current episode started 1 to 4 weeks ago. The onset quality is gradual. The problem occurs intermittently. The problem has been unchanged. The pain is located in the RUQ. The pain is at a severity of 1/10. The pain is mild. The quality of the pain is colicky. The abdominal pain does not radiate. Pertinent negatives include no anorexia, arthralgias, belching, constipation, diarrhea, dysuria, fever, flatus, frequency, headaches, hematochezia, hematuria, melena, myalgias, nausea, vomiting or weight loss. The pain is aggravated by eating. She has tried oral narcotic analgesics for the symptoms. The treatment provided significant relief. Prior diagnostic workup includes CT scan. Her past medical history is significant for gallstones.      Review of Systems  Constitutional: Negative for fever, chills, weight loss, diaphoresis, activity change, appetite change, fatigue and unexpected weight change.  HENT: Negative.   Eyes: Negative.   Respiratory: Negative for apnea, cough, choking, chest tightness, shortness of breath, wheezing and stridor.   Cardiovascular: Negative for chest pain, palpitations and leg swelling.  Gastrointestinal: Positive for abdominal pain. Negative for nausea, vomiting, diarrhea, constipation, blood in stool, melena, hematochezia, abdominal distention, anal bleeding, rectal pain, anorexia and flatus.  Genitourinary: Negative for dysuria, urgency, frequency, hematuria, flank pain, decreased urine volume, vaginal bleeding, vaginal discharge, enuresis, difficulty urinating, genital sores, menstrual problem, pelvic pain and dyspareunia.  Musculoskeletal: Negative for myalgias, back pain, joint swelling, arthralgias and gait problem.  Skin: Negative for color change, pallor, rash and wound.  Neurological: Negative.  Negative for headaches.    Hematological: Negative for adenopathy. Does not bruise/bleed easily.  Psychiatric/Behavioral: Negative.        Objective:   Physical Exam  Vitals reviewed. Constitutional: She is oriented to person, place, and time. She appears well-developed and well-nourished. No distress.  HENT:  Head: Normocephalic and atraumatic.  Mouth/Throat: Oropharynx is clear and moist. No oropharyngeal exudate.  Eyes: Conjunctivae are normal. Right eye exhibits no discharge. Left eye exhibits no discharge. No scleral icterus.  Neck: Normal range of motion. Neck supple. No JVD present. No tracheal deviation present. No thyromegaly present.  Cardiovascular: Normal rate, regular rhythm, normal heart sounds and intact distal pulses.  Exam reveals no gallop and no friction rub.   No murmur heard. Pulmonary/Chest: Effort normal and breath sounds normal. No stridor. No respiratory distress. She has no wheezes. She has no rales. She exhibits no tenderness.  Abdominal: Soft. Bowel sounds are normal. She exhibits no distension and no mass. There is no tenderness. There is no rebound and no guarding.  Musculoskeletal: Normal range of motion. She exhibits no edema and no tenderness.  Lymphadenopathy:    She has no cervical adenopathy.  Neurological: She is oriented to person, place, and time.  Skin: Skin is warm and dry. No rash noted. She is not diaphoretic. No erythema. No pallor.  Psychiatric: She has a normal mood and affect. Her behavior is normal. Judgment and thought content normal.      Lab Results  Component Value Date   WBC 8.4 01/28/2012   HGB 10.5* 01/28/2012   HCT 32.6* 01/28/2012   PLT 221 01/28/2012   GLUCOSE 110* 01/28/2012   CHOL  Value: 130        ATP III CLASSIFICATION:  <200     mg/dL   Desirable  409-811  mg/dL   Borderline High  >=914  mg/dL   High        09/07/1094   TRIG 90 05/05/2010   HDL 36* 05/05/2010   LDLCALC  Value: 76        Total Cholesterol/HDL:CHD Risk Coronary Heart Disease Risk  Table                     Men   Women  1/2 Average Risk   3.4   3.3  Average Risk       5.0   4.4  2 X Average Risk   9.6   7.1  3 X Average Risk  23.4   11.0        Use the calculated Patient Ratio above and the CHD Risk Table to determine the patient's CHD Risk.        ATP III CLASSIFICATION (LDL):  <100     mg/dL   Optimal  045-409  mg/dL   Near or Above                    Optimal  130-159  mg/dL   Borderline  811-914  mg/dL   High  >782     mg/dL   Very High 05/07/6212   ALT 21 01/28/2012   AST 20 01/28/2012   NA 140 01/28/2012   K 5.2* 01/28/2012   CL 105 01/28/2012   CREATININE 2.02* 01/28/2012   BUN 43* 01/28/2012   CO2 24 01/28/2012   TSH 1.15 05/08/2011   INR 2.3 01/03/2012  Ct Abdomen Pelvis Wo Contrast  01/28/2012  *RADIOLOGY REPORT*  Clinical Data: Nausea with lightheadedness and severe pain.  No vomiting.  CT ABDOMEN AND PELVIS WITHOUT CONTRAST  Technique:  Multidetector CT imaging of the abdomen and pelvis was performed following the standard protocol without intravenous contrast.  Comparison: None.  Findings: Cardiomegaly.  Dual lead pacer.  No effusion or infiltrate at the lung base.  The unenhanced appearance of the liver, spleen, pancreas, and adrenal glands are normal.  Multiple stones are noted to layer in the gallbladder, without pericholecystic fluid.  There is no biliary ductal dilatation.  There are slightly irregular kidneys without focal mass lesion, calcification, or obstruction. 1 cm left renal cyst projects superiorly from the upper pole left kidney. Unremarkable stomach, small bowel, and colon.  No appendiceal inflammation.  Atheromatous change of the aorta without aneurysmal dilatation.  No adenopathy.  Presumed hysterectomy.  Normal bladder.  No adnexal lesions.  No osseous destructive lesion. Slight superior plate depression L4 of indeterminate age of likely chronic.  IMPRESSION: Cholelithiasis without signs of acute cholecystitis.  No other significant findings.  Original Report  Authenticated By: Elsie Stain, M.D.   Dg Chest 2 View  01/28/2012  *RADIOLOGY REPORT*  Clinical Data: Chest pain.  CHEST - 2 VIEW  Comparison: 05/04/2010  Findings: Left AICD remains in place, unchanged.  Cardiomegaly. Lungs are clear.  No effusions.  No acute bony abnormality.  IMPRESSION: Cardiomegaly.  No active disease.  Original Report Authenticated By: Cyndie Chime, M.D.     Assessment & Plan:

## 2012-02-03 NOTE — Assessment & Plan Note (Signed)
CBC B12 and Folate levels today

## 2012-02-03 NOTE — Progress Notes (Signed)
Addended by: Etta Grandchild on: 02/03/2012 05:46 PM   Modules accepted: Orders

## 2012-02-03 NOTE — Assessment & Plan Note (Addendum)
Her BP is well controlled, I will recheck her lytes and renal function today 

## 2012-02-03 NOTE — Assessment & Plan Note (Addendum)
I will recheck her renal function today to see if this is a stable issue and I will look at her urine to see if there is any protein  Late note, her renal function has worsened and her K+ is even higher so I have asked her to stop spironolactone, lisinopril, and K tabs, she will return in 2-3 days for a K+ recheck

## 2012-02-03 NOTE — Progress Notes (Signed)
Addended by: Etta Grandchild on: 02/03/2012 05:49 PM   Modules accepted: Orders

## 2012-02-04 ENCOUNTER — Other Ambulatory Visit: Payer: Self-pay | Admitting: *Deleted

## 2012-02-04 NOTE — Telephone Encounter (Signed)
Opened in Error.

## 2012-02-05 ENCOUNTER — Ambulatory Visit (HOSPITAL_COMMUNITY)
Admission: RE | Admit: 2012-02-05 | Discharge: 2012-02-05 | Disposition: A | Discharge status: Home / Self Care | Payer: Medicare Other | Source: Ambulatory Visit | Attending: Internal Medicine | Admitting: Internal Medicine

## 2012-02-05 DIAGNOSIS — N179 Acute kidney failure, unspecified: Secondary | ICD-10-CM | POA: Insufficient documentation

## 2012-02-05 DIAGNOSIS — I10 Essential (primary) hypertension: Secondary | ICD-10-CM | POA: Diagnosis not present

## 2012-02-05 DIAGNOSIS — N182 Chronic kidney disease, stage 2 (mild): Secondary | ICD-10-CM

## 2012-02-07 ENCOUNTER — Ambulatory Visit: Payer: Medicare Other | Admitting: Internal Medicine

## 2012-02-07 DIAGNOSIS — Z0289 Encounter for other administrative examinations: Secondary | ICD-10-CM

## 2012-02-13 ENCOUNTER — Encounter: Payer: Self-pay | Admitting: Internal Medicine

## 2012-02-13 ENCOUNTER — Ambulatory Visit (INDEPENDENT_AMBULATORY_CARE_PROVIDER_SITE_OTHER): Payer: Medicare Other | Admitting: *Deleted

## 2012-02-13 DIAGNOSIS — I428 Other cardiomyopathies: Secondary | ICD-10-CM

## 2012-02-13 DIAGNOSIS — I5023 Acute on chronic systolic (congestive) heart failure: Secondary | ICD-10-CM

## 2012-02-13 LAB — REMOTE ICD DEVICE
AL AMPLITUDE: 3 mv
AL IMPEDENCE ICD: 360 Ohm
ATRIAL PACING ICD: 64 pct
BAMS-0001: 150 {beats}/min
BAMS-0003: 60 {beats}/min
DEV-0020ICD: NEGATIVE
DEVICE MODEL ICD: 754815
HV IMPEDENCE: 46 Ohm
RV LEAD AMPLITUDE: 11.4 mv
RV LEAD IMPEDENCE ICD: 410 Ohm
TZON-0003SLOWVT: 350 ms
TZON-0004SLOWVT: 24
TZON-0005SLOWVT: 6
TZON-0010SLOWVT: 80 ms
VENTRICULAR PACING ICD: 0 pct

## 2012-02-14 ENCOUNTER — Encounter: Payer: Self-pay | Admitting: Internal Medicine

## 2012-02-14 ENCOUNTER — Ambulatory Visit (INDEPENDENT_AMBULATORY_CARE_PROVIDER_SITE_OTHER): Payer: Medicare Other | Admitting: *Deleted

## 2012-02-14 ENCOUNTER — Other Ambulatory Visit (INDEPENDENT_AMBULATORY_CARE_PROVIDER_SITE_OTHER): Payer: Medicare Other

## 2012-02-14 ENCOUNTER — Ambulatory Visit (INDEPENDENT_AMBULATORY_CARE_PROVIDER_SITE_OTHER): Payer: Medicare Other | Admitting: Internal Medicine

## 2012-02-14 VITALS — BP 92/50 | HR 60 | Temp 98.1°F | Resp 16 | Wt 176.0 lb

## 2012-02-14 DIAGNOSIS — A09 Infectious gastroenteritis and colitis, unspecified: Secondary | ICD-10-CM | POA: Diagnosis not present

## 2012-02-14 DIAGNOSIS — N182 Chronic kidney disease, stage 2 (mild): Secondary | ICD-10-CM

## 2012-02-14 DIAGNOSIS — I4891 Unspecified atrial fibrillation: Secondary | ICD-10-CM

## 2012-02-14 DIAGNOSIS — I4892 Unspecified atrial flutter: Secondary | ICD-10-CM | POA: Diagnosis not present

## 2012-02-14 DIAGNOSIS — Z8679 Personal history of other diseases of the circulatory system: Secondary | ICD-10-CM | POA: Diagnosis not present

## 2012-02-14 DIAGNOSIS — K921 Melena: Secondary | ICD-10-CM

## 2012-02-14 DIAGNOSIS — I10 Essential (primary) hypertension: Secondary | ICD-10-CM

## 2012-02-14 DIAGNOSIS — I5023 Acute on chronic systolic (congestive) heart failure: Secondary | ICD-10-CM

## 2012-02-14 DIAGNOSIS — E875 Hyperkalemia: Secondary | ICD-10-CM

## 2012-02-14 DIAGNOSIS — D649 Anemia, unspecified: Secondary | ICD-10-CM

## 2012-02-14 DIAGNOSIS — K922 Gastrointestinal hemorrhage, unspecified: Secondary | ICD-10-CM | POA: Insufficient documentation

## 2012-02-14 DIAGNOSIS — Z7901 Long term (current) use of anticoagulants: Secondary | ICD-10-CM

## 2012-02-14 LAB — BASIC METABOLIC PANEL
BUN: 22 mg/dL (ref 6–23)
CO2: 26 mEq/L (ref 19–32)
Calcium: 8.5 mg/dL (ref 8.4–10.5)
Chloride: 107 mEq/L (ref 96–112)
Creatinine, Ser: 1.7 mg/dL — ABNORMAL HIGH (ref 0.4–1.2)
GFR: 38.98 mL/min — ABNORMAL LOW (ref >60.00)
Glucose, Bld: 84 mg/dL (ref 70–99)
Potassium: 3.7 mEq/L (ref 3.5–5.1)
Sodium: 141 mEq/L (ref 135–145)

## 2012-02-14 LAB — CBC WITH DIFFERENTIAL/PLATELET
Basophils Absolute: 0 10*3/uL (ref 0.0–0.1)
Basophils Relative: 0.2 % (ref 0.0–3.0)
Eosinophils Absolute: 0.2 10*3/uL (ref 0.0–0.7)
Eosinophils Relative: 1.8 % (ref 0.0–5.0)
HCT: 28.5 % — ABNORMAL LOW (ref 36.0–46.0)
Hemoglobin: 9.5 g/dL — ABNORMAL LOW (ref 12.0–15.0)
Lymphocytes Relative: 21.3 % (ref 12.0–46.0)
Lymphs Abs: 1.9 10*3/uL (ref 0.7–4.0)
MCHC: 33.4 g/dL (ref 30.0–36.0)
MCV: 90 fl (ref 78.0–100.0)
Monocytes Absolute: 1 10*3/uL (ref 0.1–1.0)
Monocytes Relative: 11 % (ref 3.0–12.0)
Neutro Abs: 5.8 10*3/uL (ref 1.4–7.7)
Neutrophils Relative %: 65.7 % (ref 43.0–77.0)
Platelets: 216 10*3/uL (ref 150.0–400.0)
RBC: 3.17 Mil/uL — ABNORMAL LOW (ref 3.87–5.11)
RDW: 16.7 % — ABNORMAL HIGH (ref 11.5–14.6)
WBC: 8.9 10*3/uL (ref 4.5–10.5)

## 2012-02-14 LAB — CORTISOL: Cortisol, Plasma: 12.9 ug/dL

## 2012-02-14 LAB — POCT INR: INR: 2.4

## 2012-02-14 LAB — SEDIMENTATION RATE: Sed Rate: 117 mm/hr — ABNORMAL HIGH (ref 0–22)

## 2012-02-14 MED ORDER — METRONIDAZOLE 250 MG PO TABS: 250.0000 mg | ORAL_TABLET | Freq: Three times a day (TID) | ORAL | Status: AC

## 2012-02-14 NOTE — Progress Notes (Signed)
Subjective:    Patient ID: Brianna Hanson, female    DOB: Feb 15, 1935, 77 y.o.   MRN: [id number]  Diarrhea  This is a new problem. The current episode started in the past 7 days. The problem occurs 2 to 4 times per day. The problem has been unchanged. The stool consistency is described as bloody, watery and mucous. Associated symptoms include abdominal pain. Pertinent negatives include no arthralgias, bloating, chills, coughing, fever, headaches, increased  flatus, myalgias, sweats, URI, vomiting or weight loss. Nothing aggravates the symptoms. Risk factors include no known risk factors. She has tried nothing for the symptoms.      Review of Systems  Constitutional: Negative for fever, chills, weight loss, diaphoresis, activity change, appetite change, fatigue and unexpected weight change.  HENT: Negative.   Eyes: Negative.   Respiratory: Negative for cough, choking, chest tightness, shortness of breath, wheezing and stridor.   Cardiovascular: Negative for chest pain, palpitations and leg swelling.  Gastrointestinal: Positive for abdominal pain, diarrhea and blood in stool. Negative for nausea, vomiting, constipation, abdominal distention, anal bleeding, rectal pain, bloating and flatus.  Genitourinary: Negative.   Musculoskeletal: Negative for myalgias, back pain, joint swelling, arthralgias and gait problem.  Skin: Negative for color change, pallor, rash and wound.  Neurological: Negative for dizziness, tremors, seizures, syncope, facial asymmetry, speech difficulty, weakness, light-headedness, numbness and headaches.  Hematological: Negative for adenopathy. Does not bruise/bleed easily.  Psychiatric/Behavioral: Negative.        Objective:   Physical Exam  Vitals reviewed. Constitutional: She is oriented to person, place, and time. Vital signs are normal. She appears well-developed and well-nourished.  Non-toxic appearance. She does not have a sickly appearance. She does not appear ill.  No distress.  HENT:  Head: Normocephalic and atraumatic.  Mouth/Throat: Oropharynx is clear and moist. No oropharyngeal exudate.  Eyes: Conjunctivae are normal. Right eye exhibits no discharge. Left eye exhibits no discharge. No scleral icterus.  Neck: Normal range of motion. Neck supple. No JVD present. No tracheal deviation present. No thyromegaly present.  Cardiovascular: Normal rate, regular rhythm, normal heart sounds and intact distal pulses.  Exam reveals no gallop and no friction rub.   No murmur heard. Pulmonary/Chest: Effort normal and breath sounds normal. No stridor. No respiratory distress. She has no wheezes. She has no rales. She exhibits no tenderness.  Abdominal: Soft. Bowel sounds are normal. She exhibits no distension and no mass. There is no tenderness. There is no rebound and no guarding.  Genitourinary: Rectal exam shows no external hemorrhoid, no internal hemorrhoid, no fissure, no mass, no tenderness and anal tone normal. Guaiac positive stool.  Musculoskeletal: Normal range of motion. She exhibits no edema and no tenderness.  Lymphadenopathy:    She has no cervical adenopathy.  Neurological: She is oriented to person, place, and time.  Skin: Skin is warm and dry. No rash noted. No erythema. No pallor.  Psychiatric: She has a normal mood and affect. Her behavior is normal. Judgment and thought content normal.      Lab Results  Component Value Date   WBC 7.8 02/03/2012   HGB 10.9* 02/03/2012   HCT 33.3* 02/03/2012   PLT 247.0 02/03/2012   GLUCOSE 103* 02/03/2012   CHOL  Value: 130        ATP III CLASSIFICATION:  <200     mg/dL   Desirable  409-811  mg/dL   Borderline High  >=914    mg/dL   High  05/05/2010   TRIG 90 05/05/2010   HDL 36* 05/05/2010   LDLCALC  Value: 76        Total Cholesterol/HDL:CHD Risk Coronary Heart Disease Risk Table                     Men   Women  1/2 Average Risk   3.4   3.3  Average Risk       5.0   4.4  2 X Average Risk   9.6   7.1  3 X Average  Risk  23.4   11.0        Use the calculated Patient Ratio above and the CHD Risk Table to determine the patient's CHD Risk.        ATP III CLASSIFICATION (LDL):  <100     mg/dL   Optimal  161-096  mg/dL   Near or Above                    Optimal  130-159  mg/dL   Borderline  045-409  mg/dL   High  >811     mg/dL   Very High 05/03/4781   ALT 19 02/03/2012   AST 18 02/03/2012   NA 139 02/03/2012   K 6.0* 02/03/2012   CL 105 02/03/2012   CREATININE 2.9* 02/03/2012   BUN 63* 02/03/2012   CO2 25 02/03/2012   TSH 2.23 02/03/2012   INR 2.4 02/14/2012   HGBA1C 6.1 02/03/2012  US Renal  02/05/2012  *RADIOLOGY REPORT*  Clinical Data: Acute renal failure.  History of hypertension.  RENAL/URINARY TRACT ULTRASOUND COMPLETE  Comparison:  Abdominal pelvic CT 01/28/2012.  Findings:  Right Kidney:  There is increased cortical echogenicity without focal abnormality.  There is no hydronephrosis.  Renal length 10.1 cm.  Left Kidney:  There is increased cortical echogenicity with a cyst in the upper pole measuring 1.2 x 1.1 x 1.1 cm.  There is no hydronephrosis.  Renal length 10.4 cm.  Bladder:  The bladder is not visible.  The patient voided prior to the examination.  IMPRESSION:  1.  Both kidneys demonstrate increased cortical echogenicity consistent with "chronic medical renal disease." 2.  No evidence of hydronephrosis.  Original Report Authenticated By: Gerrianne Scale, M.D.     Assessment & Plan:

## 2012-02-14 NOTE — Patient Instructions (Signed)
Diarrhea Infections caused by germs (bacterial) or a virus commonly cause diarrhea. Your caregiver has determined that with time, rest and fluids, the diarrhea should improve. In general, eat normally while drinking more water than usual. Although water may prevent dehydration, it does not contain salt and minerals (electrolytes). Broths, weak tea without caffeine and oral rehydration solutions (ORS) replace fluids and electrolytes. Small amounts of fluids should be taken frequently. Large amounts at one time may not be tolerated. Plain water may be harmful in infants and the elderly. Oral rehydrating solutions (ORS) are available at pharmacies and grocery stores. ORS replace water and important electrolytes in proper proportions. Sports drinks are not as effective as ORS and may be harmful due to sugars worsening diarrhea.  ORS is especially recommended for use in children with diarrhea. As a general guideline for children, replace any new fluid losses from diarrhea and/or vomiting with ORS as follows:   If your child weighs 22 pounds or under (10 kg or less), give 60-120 mL ( -  cup or 2 - 4 ounces) of ORS for each episode of diarrheal stool or vomiting episode.   If your child weighs more than 22 pounds (more than 10 kgs), give 120-240 mL ( - 1 cup or 4 - 8 ounces) of ORS for each diarrheal stool or episode of vomiting.   While correcting for dehydration, children should eat normally. However, foods high in sugar should be avoided because this may worsen diarrhea. Large amounts of carbonated soft drinks, juice, gelatin desserts and other highly sugared drinks should be avoided.   After correction of dehydration, other liquids that are appealing to the child may be added. Children should drink small amounts of fluids frequently and fluids should be increased as tolerated. Children should drink enough fluids to keep urine clear or pale yellow.   Adults should eat normally while drinking more fluids  than usual. Drink small amounts of fluids frequently and increase as tolerated. Drink enough fluids to keep urine clear or pale yellow. Broths, weak decaffeinated tea, lemon lime soft drinks (allowed to go flat) and ORS replace fluids and electrolytes.   Avoid:   Carbonated drinks.   Juice.   Extremely hot or cold fluids.   Caffeine drinks.   Fatty, greasy foods.   Alcohol.   Tobacco.   Too much intake of anything at one time.   Gelatin desserts.   Probiotics are active cultures of beneficial bacteria. They may lessen the amount and number of diarrheal stools in adults. Probiotics can be found in yogurt with active cultures and in supplements.   Wash hands well to avoid spreading bacteria and virus.   Anti-diarrheal medications are not recommended for infants and children.   Only take over-the-counter or prescription medicines for pain, discomfort or fever as directed by your caregiver. Do not give aspirin to children because it may cause Reye's Syndrome.   For adults, ask your caregiver if you should continue all prescribed and over-the-counter medicines.   If your caregiver has given you a follow-up appointment, it is very important to keep that appointment. Not keeping the appointment could result in a chronic or permanent injury, and disability. If there is any problem keeping the appointment, you must call back to this facility for assistance.  SEEK IMMEDIATE MEDICAL CARE IF:   You or your child is unable to keep fluids down or other symptoms or problems become worse in spite of treatment.   Vomiting or diarrhea develops and becomes persistent.     There is vomiting of blood or bile (green material).   There is blood in the stool or the stools are black and tarry.   There is no urine output in 6-8 hours or there is only a small amount of very dark urine.   Abdominal pain develops, increases or localizes.   You have a fever.   Your baby is older than 3 months with a  rectal temperature of 102 F (38.9 C) or higher.   Your baby is 3 months old or younger with a rectal temperature of 100.4 F (38 C) or higher.   You or your child develops excessive weakness, dizziness, fainting or extreme thirst.   You or your child develops a rash, stiff neck, severe headache or become irritable or sleepy and difficult to awaken.  MAKE SURE YOU:   Understand these instructions.   Will watch your condition.   Will get help right away if you are not doing well or get worse.  Document Released: 08/09/2002 Document Revised: 08/08/2011 Document Reviewed: 06/26/2009 [employer] Patient Information 2012 [employer]. 

## 2012-02-15 ENCOUNTER — Encounter: Payer: Self-pay | Admitting: Internal Medicine

## 2012-02-15 NOTE — Assessment & Plan Note (Signed)
She has bloody diarrhea and I am concerned that she may have C diff infection so I have ordered a CBC and stool studies and will empirically start flagyl

## 2012-02-15 NOTE — Assessment & Plan Note (Signed)
She has blood in her stool and anemia so I have asked her to see GI for possible endoscopy

## 2012-02-15 NOTE — Assessment & Plan Note (Signed)
Repeat CBC today 

## 2012-02-15 NOTE — Assessment & Plan Note (Signed)
EKG does not show any signs of cardiac involvement, I will recheck her K+ level today

## 2012-02-15 NOTE — Assessment & Plan Note (Addendum)
I will recheck her renal function today, if her renal function dose not improve then she will need to see nephrology

## 2012-02-17 ENCOUNTER — Other Ambulatory Visit: Payer: Medicare Other

## 2012-02-17 DIAGNOSIS — A09 Infectious gastroenteritis and colitis, unspecified: Secondary | ICD-10-CM | POA: Diagnosis not present

## 2012-02-18 LAB — FECAL LACTOFERRIN, QUANT: Lactoferrin: POSITIVE

## 2012-02-18 LAB — CLOSTRIDIUM DIFFICILE EIA: CDIFTX: NEGATIVE

## 2012-02-21 ENCOUNTER — Encounter: Payer: Self-pay | Admitting: Internal Medicine

## 2012-02-21 LAB — STOOL CULTURE

## 2012-02-25 ENCOUNTER — Ambulatory Visit (INDEPENDENT_AMBULATORY_CARE_PROVIDER_SITE_OTHER): Payer: Medicare Other | Admitting: Surgery

## 2012-02-25 ENCOUNTER — Encounter: Payer: Self-pay | Admitting: *Deleted

## 2012-03-03 ENCOUNTER — Encounter (INDEPENDENT_AMBULATORY_CARE_PROVIDER_SITE_OTHER): Payer: Self-pay | Admitting: Surgery

## 2012-03-03 ENCOUNTER — Ambulatory Visit (INDEPENDENT_AMBULATORY_CARE_PROVIDER_SITE_OTHER): Payer: Medicare Other | Admitting: Surgery

## 2012-03-03 VITALS — BP 122/84 | HR 68 | Temp 98.6°F | Resp 14 | Ht 60.0 in | Wt 180.2 lb

## 2012-03-03 DIAGNOSIS — K8020 Calculus of gallbladder without cholecystitis without obstruction: Secondary | ICD-10-CM

## 2012-03-03 DIAGNOSIS — K8050 Calculus of bile duct without cholangitis or cholecystitis without obstruction: Secondary | ICD-10-CM

## 2012-03-03 NOTE — Patient Instructions (Signed)
Get appointment to see cardiologist to evaluate heart prior to any surgery.  Call when this is done.  Low fat diet.    Cholelithiasis Cholelithiasis (also called gallstones) is a form of gallbladder disease where gallstones form in your gallbladder. The gallbladder is a non-essential organ that stores bile made in the liver, which helps digest fats. Gallstones begin as small crystals and slowly grow into stones. Gallstone pain occurs when the gallbladder spasms, and a gallstone is blocking the duct. Pain can also occur when a stone passes out of the duct.  Women are more likely to develop gallstones than men. Other factors that increase the risk of gallbladder disease are:  Having multiple pregnancies. Physicians sometimes advise removing diseased gallbladders before future pregnancies.   Obesity.   Diets heavy in fried foods and fat.   Increasing age (older than 14).   Prolonged use of medications containing female hormones.   Diabetes mellitus.   Rapid weight loss.   Family history of gallstones (heredity).  SYMPTOMS  Feeling sick to your stomach (nauseous).   Abdominal pain.   Yellowing of the skin (jaundice).   Sudden pain. It may persist from several minutes to several hours.   Worsening pain with deep breathing or when jarred.   Fever.   Tenderness to the touch.  In some cases, when gallstones do not move into the bile duct, people have no pain or symptoms. These are called "silent" gallstones. TREATMENT In severe cases, emergency surgery may be required. HOME CARE INSTRUCTIONS   Only take over-the-counter or prescription medicines for pain, discomfort, or fever as directed by your caregiver.   Follow a low-fat diet until seen again. Fat causes the gallbladder to contract, which can result in pain.   Follow up as instructed. Attacks are almost always recurrent and surgery is usually required for permanent treatment.  SEEK IMMEDIATE MEDICAL CARE IF:   Your pain  increases and is not controlled by medications.   You have an oral temperature above 102 F (38.9 C), not controlled by medication.   You develop nausea and vomiting.  MAKE SURE YOU:   Understand these instructions.   Will watch your condition.   Will get help right away if you are not doing well or get worse.  Document Released: 08/15/2005 Document Revised: 08/08/2011 Document Reviewed: 10/18/2010 [facility] Patient Information 2012 [place], [place].

## 2012-03-03 NOTE — Progress Notes (Signed)
Patient ID: Brianna Hanson, female   DOB: July 12, 1935, 77 y.o.   MRN: [id number]  Chief Complaint  Patient presents with  . Abdominal Pain    new pt- eval GB    HPI Brianna Hanson is a 77 y.o. female.   HPIPatient sent at the request of Dr. Yetta Barre due to gallstones. She was seen one-month ago with epigastric abdominal pain. The pain lasted 30 minutes when awake. It was associated with nausea and vomiting. She has had no other episodes of this since. Fatty foods make her feel sick or stomach. She is complaints of hand and foot swelling.  Past Medical History  Diagnosis Date  . Cardiac arrest - ventricular fibrillation 12/10    with successful resucitation, S/p ICD  . ICD (implantable cardiac defibrillator) in place     she has received appropriate therapy for VF  . Atrial fibrillation or flutter   . Osteopenia   . HTN (hypertension)     moderate  . Nonischemic cardiomyopathy     followed by Dr Glori Luis at [facility]  . CHF (congestive heart failure)   . Anemia   . S/P colonoscopy     Past Surgical History  Procedure Date  . Abdominal hysterectomy   . Cardiac catheterization   . Cardiac defibrillator placement     by JA for secondary prevention of sudden death    Family History  Problem Relation Age of Onset  . Stroke Brother   . Stroke Mother     Social History History  Substance Use Topics  . Smoking status: Former Games developer  . Smokeless tobacco: Never Used   Comment: remote history of tobacco abuse  . Alcohol Use: No    No Known Allergies  Current Outpatient Prescriptions  Medication Sig Dispense Refill  . amiodarone (PACERONE) 200 MG tablet Take 1 tablet (200 mg total) by mouth daily.  30 tablet  6  . carvedilol (COREG) 25 MG tablet Take 1 tablet (25 mg total) by mouth 2 (two) times daily with a meal.  90 tablet  4  . Cholecalciferol (VITAMIN D3) 1000 UNITS CAPS Take 1 tablet by mouth daily.        [name] desonide (DESOWEN) 0.05 % cream Apply 1 application topically 2 (two)  times daily as needed. For skin allergy      . furosemide (LASIX) 40 MG tablet Take 40-80 mg by mouth 2 (two) times daily. Take 2 tabs in the morning and 1 tab in the evening for a total of 3 tabs a day      . lisinopril (PRINIVIL,ZESTRIL) 20 MG tablet daily.      [name] warfarin (COUMADIN) 2.5 MG tablet Take as directed per coumadin clinic  45 tablet  3    Review of Systems Review of Systems  Constitutional: Negative.   HENT: Negative.   Eyes: Negative.   Respiratory: Negative.   Cardiovascular: Positive for leg swelling.  Gastrointestinal: Negative.   Musculoskeletal: Negative.   Neurological: Negative.   Hematological: Negative.   Psychiatric/Behavioral: Negative.     Blood pressure 122/84, pulse 68, temperature 98.6 F (37 C), temperature source Temporal, resp. rate 14, height 5' (1.524 m), weight 180 lb 3.2 oz (81.738 kg).  Physical Exam Physical Exam  Constitutional: She is oriented to person, place, and time. She appears well-developed and well-nourished.  HENT:  Head: Normocephalic and atraumatic.  Eyes: EOM are normal. Pupils are equal, round, and reactive to light.  Neck: Normal range of motion. Neck supple.  Cardiovascular:  Normal rate and regular rhythm.   Murmur heard. Pulmonary/Chest: Effort normal and breath sounds normal.  Abdominal: Soft. Bowel sounds are normal. She exhibits no distension. There is no tenderness.  Musculoskeletal: Normal range of motion. She exhibits edema.  Neurological: She is alert and oriented to person, place, and time.  Skin: Skin is warm and dry.  Psychiatric: She has a normal mood and affect. Her behavior is normal. Judgment and thought content normal.    Data Reviewed CT gallstones without inflammation.  Assessment    Biliary colic    Plan    Will need cholecystectomy.  Recommend she see her cardiologist prior to any intervention.  She is minimally symptomatic for now.  Recommend low fat diet.       Delinda Malan  A. 03/03/2012, 11:39 AM

## 2012-03-10 DIAGNOSIS — N2581 Secondary hyperparathyroidism of renal origin: Secondary | ICD-10-CM | POA: Diagnosis not present

## 2012-03-10 DIAGNOSIS — D649 Anemia, unspecified: Secondary | ICD-10-CM | POA: Diagnosis not present

## 2012-03-10 DIAGNOSIS — N183 Chronic kidney disease, stage 3 unspecified: Secondary | ICD-10-CM | POA: Diagnosis not present

## 2012-03-10 LAB — CLOSTRIDIUM DIFFICILE TOXIN

## 2012-03-17 ENCOUNTER — Telehealth: Payer: Self-pay | Admitting: *Deleted

## 2012-03-17 NOTE — Telephone Encounter (Signed)
PATIENT CONFIRMED OVER THE PHONE ON 03-17-2012 THE NEW DATE AND TIME

## 2012-03-18 ENCOUNTER — Telehealth: Payer: Self-pay | Admitting: Oncology

## 2012-03-18 NOTE — Telephone Encounter (Signed)
New Pt package mailed out.  °

## 2012-03-20 ENCOUNTER — Other Ambulatory Visit: Payer: Self-pay | Admitting: Oncology

## 2012-03-20 ENCOUNTER — Encounter: Payer: Self-pay | Admitting: Oncology

## 2012-03-20 DIAGNOSIS — D729 Disorder of white blood cells, unspecified: Secondary | ICD-10-CM | POA: Insufficient documentation

## 2012-03-20 HISTORY — DX: Disorder of white blood cells, unspecified: D72.9

## 2012-03-24 ENCOUNTER — Telehealth: Payer: Self-pay | Admitting: *Deleted

## 2012-03-24 ENCOUNTER — Ambulatory Visit (HOSPITAL_BASED_OUTPATIENT_CLINIC_OR_DEPARTMENT_OTHER): Payer: Medicare Other | Admitting: Oncology

## 2012-03-24 ENCOUNTER — Other Ambulatory Visit (HOSPITAL_BASED_OUTPATIENT_CLINIC_OR_DEPARTMENT_OTHER): Payer: Medicare Other | Admitting: Lab

## 2012-03-24 ENCOUNTER — Other Ambulatory Visit: Payer: Medicare Other | Admitting: Lab

## 2012-03-24 ENCOUNTER — Ambulatory Visit (HOSPITAL_COMMUNITY)
Admission: RE | Admit: 2012-03-24 | Discharge: 2012-03-24 | Disposition: A | Discharge status: Home / Self Care | Payer: Medicare Other | Source: Ambulatory Visit | Attending: Oncology | Admitting: Oncology

## 2012-03-24 ENCOUNTER — Ambulatory Visit: Payer: Medicare Other

## 2012-03-24 VITALS — BP 113/71 | HR 66 | Temp 97.2°F | Ht 60.0 in | Wt 172.5 lb

## 2012-03-24 DIAGNOSIS — D7289 Other specified disorders of white blood cells: Secondary | ICD-10-CM | POA: Diagnosis not present

## 2012-03-24 DIAGNOSIS — E8809 Other disorders of plasma-protein metabolism, not elsewhere classified: Secondary | ICD-10-CM | POA: Diagnosis not present

## 2012-03-24 DIAGNOSIS — D72819 Decreased white blood cell count, unspecified: Secondary | ICD-10-CM | POA: Diagnosis not present

## 2012-03-24 DIAGNOSIS — D729 Disorder of white blood cells, unspecified: Secondary | ICD-10-CM

## 2012-03-24 LAB — CBC WITH DIFFERENTIAL/PLATELET
BASO%: 0.8 % (ref 0.0–2.0)
Basophils Absolute: 0 10*3/uL (ref 0.0–0.1)
EOS%: 3.5 % (ref 0.0–7.0)
Eosinophils Absolute: 0.2 10*3/uL (ref 0.0–0.5)
HCT: 32.2 % — ABNORMAL LOW (ref 34.8–46.6)
HGB: 10.5 g/dL — ABNORMAL LOW (ref 11.6–15.9)
LYMPH%: 29.3 % (ref 14.0–49.7)
MCH: 28.1 pg (ref 25.1–34.0)
MCHC: 32.7 g/dL (ref 31.5–36.0)
MCV: 85.8 fL (ref 79.5–101.0)
MONO#: 0.8 10*3/uL (ref 0.1–0.9)
MONO%: 12.7 % (ref 0.0–14.0)
NEUT#: 3.2 10*3/uL (ref 1.5–6.5)
NEUT%: 53.7 % (ref 38.4–76.8)
Platelets: 335 10*3/uL (ref 145–400)
RBC: 3.75 10*6/uL (ref 3.70–5.45)
RDW: 16.8 % — ABNORMAL HIGH (ref 11.2–14.5)
WBC: 6 10*3/uL (ref 3.9–10.3)
lymph#: 1.8 10*3/uL (ref 0.9–3.3)

## 2012-03-24 NOTE — Telephone Encounter (Signed)
Gave patient appointment for 04-02-2012 at 3:45pm per orders from 03-23-2012 gave patient instruction on going over to the radiology department for walk in procedure

## 2012-03-24 NOTE — Progress Notes (Signed)
CC:   Sanda Linger, MD Garnetta Buddy, M.D.  PRINCIPAL DIAGNOSIS:  This is a 77 year old woman with a plasma cell disorder.  HISTORY OF PRESENT ILLNESS:  A 77 year old woman referred to me for evaluation for a plasma cell disorder.  She is a native of [place] and worked at [employer] and currently retired.  She has a longstanding history of hypertension.  She has history of coronary artery disease and recently renal insufficiency.  The patient follows with Dr. Sanda Linger and noted her creatinine has started to increase slightly.  It had been stable around 1.7 and had increased up to 2 and she was referred to Dr. Hyman Hopes for evaluation for her renal insufficiency.  Her creatinine was up to 2.9 on 02/03/2012, but did go down to 1.7 on February 14, 2012.  Upon Dr. Marland Mcalpine evaluation, she had a serum protein electrophoresis that was obtained and she had an M-spike of 0.6 g/dL with immunofixation showing an IgA kappa with light chain specificity.  Quantitative immunoglobulin showed an IgA level of 1078; upper limit of normal is 414.  Urine immunofixation also picked up on an M-spike with positive Bence-Jones proteinuria kappa subtype.  In the meantime, her repeat chemistry showed a creatinine of 1.28, which seems to be improving from a creatinine of 1.5 and 1.7 previously.  Her hemoglobin is 10.2, white cell count is 6.7, and platelet count of 329.  She had normal liver function tests, including protein, albumin, and calcium.  For that reason, the patient was referred to me for evaluation.  Upon interviewing Ms. Lanae Boast, she is asymptomatic at this point.  She is not reporting any back pain.  Does not report any shoulder pain.  She is not reporting any pathological fracture.  Has not reported any change in her activity level or performance status.  She is rather functional, although she does not drive, but able to perform most activities of daily living without any hindrance or decline.  She has  continued to perform activities of daily living without any major changes.  She had not had any peripheral neuropathy.  Had not reported any immune dysregulation or recurrent sinopulmonary infection.  REVIEW OF SYSTEMS:  Does not report any headaches, blurry vision, double vision.  Does not report any motor or sensory neuropathy.  Does not report any alteration in mental status.  Does not report any psychiatric issues or depression.  Does not report any fever, chills, sweats.  Does not report any cough, hemoptysis, hematemesis.  No nausea, vomiting, abdominal pain.  No hematochezia.  No melena.  No genitourinary complaints.  Rest of review of systems is unremarkable.  PAST MEDICAL HISTORY:  Significant for hypertension, chronic renal sufficiency, status post hysterectomy.  She also has history of coronary artery disease, status post cardiac arrest and defibrillator placement. She also has history of atrial fibrillation, currently anticoagulated on Coumadin.  MEDICATIONS:  She is on amiodarone, Coreg, vitamin D, Lasix, and warfarin.  ALLERGIES:  None.  SOCIAL HISTORY:  She lives with a partner.  She has 1 son.  She denied any alcohol or tobacco use.  She is retired from [employer].  FAMILY HISTORY:  Unremarkable.  There is no history of any blood disorders or malignancies.  Father died of coronary artery disease. Mother had a stroke.  She has 4 sisters and 2 brothers, in relatively good health.  PHYSICAL EXAMINATION:  General:  Alert, awake female, appeared in no active distress.  Vital Signs:  Blood pressure  today is 113/71, pulse is 66, respirations 20.  She is afebrile.  HEENT:  Head is normocephalic, atraumatic.  Pupils equal, round, and reactive to light.  Oral mucosa moist and pink.  Neck:  Supple without lymphadenopathy.  Heart:  Regular rate and rhythm, S1, S2.  Lungs:  Clear to auscultation without rhonchi, wheezes, or dullness to percussion.  Abdomen:  Soft, nontender.   No hepatosplenomegaly.  Extremities:  No clubbing, cyanosis, or edema. Neurologically:  Intact motor, sensory, and deep tendon reflexes.  LABORATORY DATA:  Showed a hemoglobin of 10.5, white cell count 6.0, platelet count 325.  ASSESSMENT AND PLAN:  A 77 year old woman with the following issues: 1. Monoclonal gammopathy, IgA subtype with an M-spike of 0.6 g/dL, IgA     level of 1610.  She also has increased kappa light chains in her     urine.  The differential diagnosis discussed today in detail with     Ms. Depner include a plasma cell disorder such as multiple myeloma     or monoclonal gammopathy of undetermined significance, and less     likely amyloidosis.  Less likely, this could also be reactive in     nature.  To work this up, I will obtain serum light chains and also     determine the kappa to lambda ratio.  I would also like to obtain a     skeletal survey at this point, and depending on these results will     determine the nature of her disease.  She might also need a bone     marrow biopsy, especially if she has an abnormal kappa to lambda     ratio in the serum, which may put her at a higher risk of having     multiple myeloma rather than MGUS.  Overall she is very     asymptomatic at this point.  Her renal insufficiency is probably     related to issues mentioned by Dr. Hyman Hopes that include long-standing     hypertension, as well as ACE inhibitors and she might have MGUS on     top of that.  However, I would like to rule out a plasma cell     disorder contributing to her renal insufficiency.  I will ask her     to come back and see me after she completes these studies. 2. Anemia.  Her hemoglobin is about 10.5 and is probably related to     renal insufficiency and chronic disease, although this also could     be a sign of an end-organ damage related to a plasma cell disorder.     Again, that determination will be made after her workup is      complete.    ______________________________ Benjiman Core, M.D. FNS/MEDQ  D:  03/24/2012  T:  03/24/2012  Job:  [id number]

## 2012-03-24 NOTE — Telephone Encounter (Signed)
gave patient appointment for 04-02-2012 gave patient instruction for walk in at the radiology department

## 2012-03-24 NOTE — Progress Notes (Signed)
Note dictated

## 2012-03-26 LAB — SPEP & IFE WITH QIG
Albumin ELP: 44.3 % — ABNORMAL LOW (ref 55.8–66.1)
Alpha-1-Globulin: 5.1 % — ABNORMAL HIGH (ref 2.9–4.9)
Alpha-2-Globulin: 13.6 % — ABNORMAL HIGH (ref 7.1–11.8)
Beta 2: 16.5 % — ABNORMAL HIGH (ref 3.2–6.5)
Beta Globulin: 5.6 % (ref 4.7–7.2)
Gamma Globulin: 14.9 % (ref 11.1–18.8)
IgA: 849 mg/dL — ABNORMAL HIGH (ref 69–380)
IgG (Immunoglobin G), Serum: 1420 mg/dL (ref 690–1700)
IgM, Serum: 85 mg/dL (ref 52–322)
M-Spike, %: 0.65 g/dL
Total Protein, Serum Electrophoresis: 7 g/dL (ref 6.0–8.3)

## 2012-03-26 LAB — COMPREHENSIVE METABOLIC PANEL
ALT: 15 U/L (ref 0–35)
AST: 23 U/L (ref 0–37)
Albumin: 3.3 g/dL — ABNORMAL LOW (ref 3.5–5.2)
Alkaline Phosphatase: 78 U/L (ref 39–117)
BUN: 12 mg/dL (ref 6–23)
CO2: 30 mEq/L (ref 19–32)
Calcium: 8.6 mg/dL (ref 8.4–10.5)
Chloride: 104 mEq/L (ref 96–112)
Creatinine, Ser: 1.26 mg/dL — ABNORMAL HIGH (ref 0.50–1.10)
Glucose, Bld: 86 mg/dL (ref 70–99)
Potassium: 3.3 mEq/L — ABNORMAL LOW (ref 3.5–5.3)
Sodium: 143 mEq/L (ref 135–145)
Total Bilirubin: 0.4 mg/dL (ref 0.3–1.2)
Total Protein: 7 g/dL (ref 6.0–8.3)

## 2012-03-26 LAB — KAPPA/LAMBDA LIGHT CHAINS
Kappa free light chain: 64.3 mg/dL — ABNORMAL HIGH (ref 0.33–1.94)
Kappa:Lambda Ratio: 19.78 — ABNORMAL HIGH (ref 0.26–1.65)
Lambda Free Lght Chn: 3.25 mg/dL — ABNORMAL HIGH (ref 0.57–2.63)

## 2012-03-27 ENCOUNTER — Ambulatory Visit (INDEPENDENT_AMBULATORY_CARE_PROVIDER_SITE_OTHER): Payer: Medicare Other | Admitting: *Deleted

## 2012-03-27 DIAGNOSIS — Z7901 Long term (current) use of anticoagulants: Secondary | ICD-10-CM | POA: Diagnosis not present

## 2012-03-27 LAB — POCT INR: INR: 3.8

## 2012-03-30 DIAGNOSIS — Z8674 Personal history of sudden cardiac arrest: Secondary | ICD-10-CM | POA: Diagnosis not present

## 2012-03-30 DIAGNOSIS — I472 Ventricular tachycardia: Secondary | ICD-10-CM | POA: Diagnosis not present

## 2012-03-30 DIAGNOSIS — I428 Other cardiomyopathies: Secondary | ICD-10-CM | POA: Diagnosis not present

## 2012-03-30 DIAGNOSIS — Z90710 Acquired absence of both cervix and uterus: Secondary | ICD-10-CM | POA: Diagnosis not present

## 2012-03-30 DIAGNOSIS — Z79899 Other long term (current) drug therapy: Secondary | ICD-10-CM | POA: Diagnosis not present

## 2012-03-30 DIAGNOSIS — I4891 Unspecified atrial fibrillation: Secondary | ICD-10-CM | POA: Diagnosis not present

## 2012-03-30 DIAGNOSIS — Z95810 Presence of automatic (implantable) cardiac defibrillator: Secondary | ICD-10-CM | POA: Diagnosis not present

## 2012-03-30 DIAGNOSIS — N19 Unspecified kidney failure: Secondary | ICD-10-CM | POA: Diagnosis not present

## 2012-03-30 DIAGNOSIS — Z7901 Long term (current) use of anticoagulants: Secondary | ICD-10-CM | POA: Diagnosis not present

## 2012-04-02 ENCOUNTER — Telehealth: Payer: Self-pay | Admitting: Oncology

## 2012-04-02 ENCOUNTER — Ambulatory Visit (HOSPITAL_BASED_OUTPATIENT_CLINIC_OR_DEPARTMENT_OTHER): Payer: Medicare Other | Admitting: Oncology

## 2012-04-02 ENCOUNTER — Other Ambulatory Visit: Payer: Self-pay | Admitting: *Deleted

## 2012-04-02 VITALS — BP 105/63 | HR 68 | Temp 97.3°F | Ht 60.0 in | Wt 176.5 lb

## 2012-04-02 DIAGNOSIS — D649 Anemia, unspecified: Secondary | ICD-10-CM | POA: Diagnosis not present

## 2012-04-02 DIAGNOSIS — D729 Disorder of white blood cells, unspecified: Secondary | ICD-10-CM

## 2012-04-02 DIAGNOSIS — D472 Monoclonal gammopathy: Secondary | ICD-10-CM

## 2012-04-02 NOTE — Telephone Encounter (Signed)
Gave pt appt for February 2014 lab and MD °

## 2012-04-02 NOTE — Progress Notes (Signed)
Hematology and Oncology Follow Up Visit  Brianna Hanson [id number] 1935-08-07 77 y.o. 04/02/2012 4:29 PM     Principle Diagnosis: 77 year old with plasma cell disorder. Likely IgA MGUS and less likely Myeloma.    Current therapy: observation and surveillance.   Interim History:  This is A 77 year old woman returns today for a follow up visit. She was seen on 7/23 for the evaluation of a plasma cell disorder.  She is asymptomatic at this point. She is not reporting any back pain. Does not report any shoulder pain. She is not reporting any pathological  fracture. Has not reported any change in her activity level or performance status. She is rather functional, although she does not drive, but able to perform most activities of daily living without any hindrance or decline. She has continued to perform activities of daily living without any major changes. She had not had any peripheral neuropathy. Had not reported any immune dysregulation or recurrent sinopulmonary infection.   Medications: I have reviewed the patient's current medications. Current outpatient prescriptions:amiodarone (PACERONE) 200 MG tablet, Take 1 tablet (200 mg total) by mouth daily., Disp: 30 tablet, Rfl: 6;  carvedilol (COREG) 25 MG tablet, Take 1 tablet (25 mg total) by mouth 2 (two) times daily with a meal., Disp: 90 tablet, Rfl: 4;  Cholecalciferol (VITAMIN D3) 1000 UNITS CAPS, Take 1 tablet by mouth daily.  , Disp: , Rfl:  desonide (DESOWEN) 0.05 % cream, Apply 1 application topically 2 (two) times daily as needed. For skin allergy, Disp: , Rfl: ;  furosemide (LASIX) 40 MG tablet, Take 40-80 mg by mouth 2 (two) times daily. Take 2 tabs in the morning and 1 tab in the evening for a total of 3 tabs a day, Disp: , Rfl: ;  potassium chloride SA (K-DUR,KLOR-CON) 20 MEQ tablet, Take 20 mEq by mouth 2 (two) times daily., Disp: , Rfl:  warfarin (COUMADIN) 2.5 MG tablet, Take as directed per coumadin clinic, Disp: 45 tablet, Rfl:  3  Allergies: No Known Allergies  Past Medical History, Surgical history, Social history, and Family History were reviewed and updated.  Review of Systems: Constitutional:  Negative for fever, chills, night sweats, anorexia, weight loss, pain. Cardiovascular: no chest pain or dyspnea on exertion Respiratory: negative Neurological: negative Dermatological: negative ENT: negative Skin: Negative. Gastrointestinal: negative Genito-Urinary: negative Hematological and Lymphatic: negative Breast: negative Musculoskeletal: negative Remaining ROS negative. Physical Exam: Blood pressure 105/63, pulse 68, temperature 97.3 F (36.3 C), temperature source Oral, height 5' (1.524 m), weight 176 lb 8 oz (80.06 kg). ECOG: 1 General appearance: alert Head: Normocephalic, without obvious abnormality, atraumatic Neck: no adenopathy, no carotid bruit, no JVD, supple, symmetrical, trachea midline and thyroid not enlarged, symmetric, no tenderness/mass/nodules Lymph nodes: Cervical, supraclavicular, and axillary nodes normal. Heart:regular rate and rhythm, S1, S2 normal, no murmur, click, rub or gallop Lung:chest clear, no wheezing, rales, normal symmetric air entry Abdomin: soft, non-tender, without masses or organomegaly EXT:no erythema, induration, or nodules   Lab Results: Lab Results  Component Value Date   WBC 6.0 03/24/2012   HGB 10.5* 03/24/2012   HCT 32.2* 03/24/2012   MCV 85.8 03/24/2012   PLT 335 03/24/2012     Chemistry      Component Value Date/Time   NA 143 03/24/2012 1042   K 3.3* 03/24/2012 1042   CL 104 03/24/2012 1042   CO2 30 03/24/2012 1042   BUN 12 03/24/2012 1042   CREATININE 1.26* 03/24/2012 1042      Component  Value Date/Time   CALCIUM 8.6 03/24/2012 1042   ALKPHOS 78 03/24/2012 1042   AST 23 03/24/2012 1042   ALT 15 03/24/2012 1042   BILITOT 0.4 03/24/2012 1042     Results for Hanson, Brianna ABRAMO (MRN [id number]) as of 04/02/2012 15:31  Ref. Range 03/24/2012 10:42  Kappa free  light chain Latest Range: 0.33-1.94 mg/dL 40.98 (H)  Kappa:Lambda Ratio Latest Range: 0.26-1.65  19.78 (H)  Lambda Free Lght Chn Latest Range: 0.57-2.63 mg/dL 1.19 (H)  Albumin ELP Latest Range: 55.8-66.1 % 44.3 (L)  COMMENT (PROTEIN ELECTROPHOR) No range found *  Alpha-1-Globulin Latest Range: 2.9-4.9 % 5.1 (H)  Alpha-2-Globulin Latest Range: 7.1-11.8 % 13.6 (H)  Beta Globulin Latest Range: 4.7-7.2 % 5.6  Beta 2 Latest Range: 3.2-6.5 % 16.5 (H)  Gamma Globulin Latest Range: 11.1-18.8 % 14.9  M-SPIKE, % No range found 0.65  SPE Interp. No range found *  IgG (Immunoglobin G), Serum Latest Range: 678-863-6780 mg/dL 1478  IgA Latest Range: 69-380 mg/dL 295 (H)  IgM, Serum Latest Range: 52-322 mg/dL 85  Total Protein, serum electrophor Latest Range: 6.0-8.3 g/dL 7.0    Radiological Studies:  Clinical Data: Plasma cell disorder.  METASTATIC BONE SURVEY  Comparison: None.  Findings: No lytic lesions are identified on the skeletal survey.  Negative skull.  Thoracic and lumbar degenerative changes are present without  fracture. Mild cervical spondylosis without cervical fracture.  AICD is in place.  Degenerative changes in the [facility] joint and hips bilaterally.  IMPRESSION:  Negative for fracture. Negative for lytic bone lesion.  Impression and Plan: A 77 year old woman with the following issues:  1. Monoclonal gammopathy, IgA subtype with an M-spike of 0.6 g/dL, IgA level of 621. The differential diagnosis include multiple myeloma (less likely given the lack of end organ damage) vs. monoclonal gammopathy of undetermined significance.  For now, I will continue to observe her and repeat protein studies in 6 months. If her M spike or her total IgA goes up or she develop signs of end organ damage, I will stage her with a bone marrow and consider treatments.   2. Anemia. Her hemoglobin is about 10.5 and is probably related to renal insufficiency and chronic disease, although this also could  be a  sign of an end-organ damage related to a plasma cell disorder but more likely due to CRI from long standing hypertension.       [facility], MD 8/1/20134:29 PM

## 2012-04-08 DIAGNOSIS — I428 Other cardiomyopathies: Secondary | ICD-10-CM | POA: Diagnosis not present

## 2012-04-08 DIAGNOSIS — I259 Chronic ischemic heart disease, unspecified: Secondary | ICD-10-CM | POA: Diagnosis not present

## 2012-04-08 DIAGNOSIS — I4891 Unspecified atrial fibrillation: Secondary | ICD-10-CM | POA: Diagnosis not present

## 2012-04-10 DIAGNOSIS — E875 Hyperkalemia: Secondary | ICD-10-CM | POA: Diagnosis not present

## 2012-04-17 ENCOUNTER — Ambulatory Visit (INDEPENDENT_AMBULATORY_CARE_PROVIDER_SITE_OTHER): Payer: Medicare Other | Admitting: Pharmacist

## 2012-04-17 ENCOUNTER — Encounter: Payer: Self-pay | Admitting: Gastroenterology

## 2012-04-17 DIAGNOSIS — I4891 Unspecified atrial fibrillation: Secondary | ICD-10-CM | POA: Diagnosis not present

## 2012-04-17 DIAGNOSIS — Z7901 Long term (current) use of anticoagulants: Secondary | ICD-10-CM

## 2012-04-17 DIAGNOSIS — I5023 Acute on chronic systolic (congestive) heart failure: Secondary | ICD-10-CM | POA: Diagnosis not present

## 2012-04-17 DIAGNOSIS — E876 Hypokalemia: Secondary | ICD-10-CM | POA: Diagnosis not present

## 2012-04-17 DIAGNOSIS — Z8679 Personal history of other diseases of the circulatory system: Secondary | ICD-10-CM

## 2012-04-17 DIAGNOSIS — I4892 Unspecified atrial flutter: Secondary | ICD-10-CM | POA: Diagnosis not present

## 2012-04-17 LAB — POCT INR: INR: 2.9

## 2012-05-06 ENCOUNTER — Encounter: Payer: Self-pay | Admitting: Pharmacist

## 2012-05-15 ENCOUNTER — Ambulatory Visit (INDEPENDENT_AMBULATORY_CARE_PROVIDER_SITE_OTHER): Payer: Medicare Other | Admitting: *Deleted

## 2012-05-15 DIAGNOSIS — I4891 Unspecified atrial fibrillation: Secondary | ICD-10-CM

## 2012-05-15 DIAGNOSIS — I5023 Acute on chronic systolic (congestive) heart failure: Secondary | ICD-10-CM | POA: Diagnosis not present

## 2012-05-15 DIAGNOSIS — I4892 Unspecified atrial flutter: Secondary | ICD-10-CM

## 2012-05-15 DIAGNOSIS — Z8679 Personal history of other diseases of the circulatory system: Secondary | ICD-10-CM

## 2012-05-15 DIAGNOSIS — Z7901 Long term (current) use of anticoagulants: Secondary | ICD-10-CM

## 2012-05-15 LAB — POCT INR: INR: 2.6

## 2012-05-18 DIAGNOSIS — D518 Other vitamin B12 deficiency anemias: Secondary | ICD-10-CM | POA: Diagnosis not present

## 2012-05-18 DIAGNOSIS — N183 Chronic kidney disease, stage 3 unspecified: Secondary | ICD-10-CM | POA: Diagnosis not present

## 2012-05-18 DIAGNOSIS — I10 Essential (primary) hypertension: Secondary | ICD-10-CM | POA: Diagnosis not present

## 2012-05-18 DIAGNOSIS — D649 Anemia, unspecified: Secondary | ICD-10-CM | POA: Diagnosis not present

## 2012-05-22 ENCOUNTER — Encounter: Payer: Self-pay | Admitting: Internal Medicine

## 2012-05-22 ENCOUNTER — Ambulatory Visit (INDEPENDENT_AMBULATORY_CARE_PROVIDER_SITE_OTHER): Payer: Medicare Other | Admitting: Internal Medicine

## 2012-05-22 VITALS — BP 121/61 | HR 64 | Ht 60.0 in | Wt 169.0 lb

## 2012-05-22 DIAGNOSIS — I428 Other cardiomyopathies: Secondary | ICD-10-CM

## 2012-05-22 DIAGNOSIS — I4901 Ventricular fibrillation: Secondary | ICD-10-CM | POA: Diagnosis not present

## 2012-05-22 DIAGNOSIS — I5023 Acute on chronic systolic (congestive) heart failure: Secondary | ICD-10-CM

## 2012-05-22 DIAGNOSIS — I4891 Unspecified atrial fibrillation: Secondary | ICD-10-CM

## 2012-05-22 LAB — ICD DEVICE OBSERVATION
AL AMPLITUDE: 2 mv
AL IMPEDENCE ICD: 312.5 Ohm
AL THRESHOLD: 0.75 V
ATRIAL PACING ICD: 24 pct
BAMS-0001: 150 {beats}/min
BAMS-0003: 60 {beats}/min
CHARGE TIME: 9.5 s
DEV-0020ICD: NEGATIVE
DEVICE MODEL ICD: 754815
FVT: 0
HV IMPEDENCE: 41 Ohm
MODE SWITCH EPISODES: 0
PACEART VT: 0
RV LEAD AMPLITUDE: 11.4 mv
RV LEAD IMPEDENCE ICD: 337.5 Ohm
RV LEAD THRESHOLD: 0.75 V
TOT-0006: 20110104000000
TOT-0007: 1
TOT-0008: 0
TOT-0009: 1
TOT-0010: 12
TZON-0003SLOWVT: 350 ms
TZON-0004SLOWVT: 24
TZON-0005SLOWVT: 6
TZON-0010SLOWVT: 80 ms
VENTRICULAR PACING ICD: 0.03 pct
VF: 0

## 2012-05-22 NOTE — Patient Instructions (Addendum)
Your physician wants you to follow-up in: 12 months with Dr Allred You will receive a reminder letter in the mail two months in advance. If you don't receive a letter, please call our office to schedule the follow-up appointment.   Remote monitoring is used to monitor your Pacemaker of ICD from home. This monitoring reduces the number of office visits required to check your device to one time per year. It allows us to keep an eye on the functioning of your device to ensure it is working properly. You are scheduled for a device check from home on 08/24/12. You may send your transmission at any time that day. If you have a wireless device, the transmission will be sent automatically. After your physician reviews your transmission, you will receive a postcard with your next transmission date.   

## 2012-05-22 NOTE — Progress Notes (Signed)
  PCP: Sanda Linger, MD Primary Cardiologist:  Dr Royann Shivers at  [facility] Brianna Hanson is a 77 y.o. female who presents today for routine electrophysiology followup.  Since last being seen in our clinic, the patient reports doing very well.  Today, she denies symptoms of palpitations, chest pain, shortness of breath,  lower extremity edema, dizziness, presyncope, syncope, or ICD shocks.  The patient is otherwise without complaint today.   Past Medical History  Diagnosis Date  . Cardiac arrest - ventricular fibrillation 12/10    with successful resucitation, S/p ICD  . ICD (implantable cardiac defibrillator) in place     she has received appropriate therapy for VF  . Atrial fibrillation or flutter   . Osteopenia   . HTN (hypertension)     moderate  . Nonischemic cardiomyopathy     followed by Dr Glori Luis at [facility]  . CHF (congestive heart failure)   . Anemia   . S/P colonoscopy   . Plasma cell disorder 03/20/2012  . Plasma cell disorder 03/20/2012   Past Surgical History  Procedure Date  . Abdominal hysterectomy   . Cardiac catheterization   . Cardiac defibrillator placement     by JA for secondary prevention of sudden death    Current Outpatient Prescriptions  Medication Sig Dispense Refill  . amiodarone (PACERONE) 200 MG tablet Take 1 tablet (200 mg total) by mouth daily.  30 tablet  6  . carvedilol (COREG) 25 MG tablet Take 1 tablet (25 mg total) by mouth 2 (two) times daily with a meal.  90 tablet  4  . Cholecalciferol (VITAMIN D3) 1000 UNITS CAPS Take 1 tablet by mouth daily.        . furosemide (LASIX) 40 MG tablet Take 40-80 mg by mouth 2 (two) times daily. Take 2 tabs in the morning and 1 tab in the evening for a total of 3 tabs a day      . losartan (COZAAR) 25 MG tablet Take 1 tablet by mouth daily.      . potassium chloride SA (K-DUR,KLOR-CON) 20 MEQ tablet Take 20 mEq by mouth 2 (two) times daily.      [name] warfarin (COUMADIN) 2.5 MG tablet Take as directed per  coumadin clinic  45 tablet  3    Physical Exam: Filed Vitals:   05/22/12 0952  BP: 121/61  Pulse: 64  Height: 5' (1.524 m)  Weight: 169 lb (76.658 kg)  SpO2: 97%    GEN- The patient is well appearing, alert and oriented x 3 today.   Head- normocephalic, atraumatic Eyes-  Sclera clear, conjunctiva pink Ears- hearing intact Oropharynx- clear Lungs- Clear to ausculation bilaterally, normal work of breathing Chest- ICD pocket is well healed Heart- Regular rate and rhythm, no murmurs, rubs or gallops, PMI not laterally displaced GI- soft, NT, ND, + BS Extremities- no clubbing, cyanosis, or edema  ICD interrogation- reviewed in detail today,  See PACEART report  Assessment and Plan:  1.  Chronic systolic dysfunction euvolemic today Stable on an appropriate medical regimen Normal ICD function See Pace Art report No changes today  2. VENTRICULAR FIBRILLATION    Normal ICD function  See Pace Art report  No changes today  Continue amiodarone   3. Atrial fibrillation  Controlled with amiodarone  Continue coumadin for stroke prevention

## 2012-05-26 DIAGNOSIS — I4891 Unspecified atrial fibrillation: Secondary | ICD-10-CM | POA: Diagnosis not present

## 2012-05-28 ENCOUNTER — Other Ambulatory Visit (HOSPITAL_COMMUNITY): Payer: Self-pay | Admitting: *Deleted

## 2012-05-29 ENCOUNTER — Encounter (HOSPITAL_COMMUNITY)
Admission: RE | Admit: 2012-05-29 | Discharge: 2012-05-29 | Disposition: A | Discharge status: Home / Self Care | Payer: Medicare Other | Source: Ambulatory Visit | Attending: Nephrology | Admitting: Nephrology

## 2012-05-29 DIAGNOSIS — D649 Anemia, unspecified: Secondary | ICD-10-CM | POA: Insufficient documentation

## 2012-05-29 MED ORDER — FERUMOXYTOL INJECTION 510 MG/17 ML
510.0000 mg | INTRAVENOUS | Status: DC
  Administered 2012-05-29: 510 mg via INTRAVENOUS

## 2012-05-29 MED ORDER — FERUMOXYTOL INJECTION 510 MG/17 ML
INTRAVENOUS | Status: AC
  Administered 2012-05-29: 510 mg via INTRAVENOUS
  Filled 2012-05-29: qty 17

## 2012-05-29 MED ORDER — SODIUM CHLORIDE 0.9 % IV SOLN
INTRAVENOUS | Status: DC
  Administered 2012-05-29: 11:00:00 via INTRAVENOUS

## 2012-06-02 ENCOUNTER — Ambulatory Visit: Payer: Medicare Other | Admitting: Oncology

## 2012-06-05 ENCOUNTER — Encounter (HOSPITAL_COMMUNITY)
Admission: RE | Admit: 2012-06-05 | Discharge: 2012-06-05 | Disposition: A | Discharge status: Home / Self Care | Payer: Medicare Other | Source: Ambulatory Visit | Attending: Nephrology | Admitting: Nephrology

## 2012-06-05 DIAGNOSIS — D649 Anemia, unspecified: Secondary | ICD-10-CM | POA: Insufficient documentation

## 2012-06-05 MED ORDER — FERUMOXYTOL INJECTION 510 MG/17 ML
INTRAVENOUS | Status: AC
  Filled 2012-06-05: qty 17

## 2012-06-05 MED ORDER — FERUMOXYTOL INJECTION 510 MG/17 ML
510.0000 mg | INTRAVENOUS | Status: AC
  Administered 2012-06-05: 510 mg via INTRAVENOUS

## 2012-06-05 MED ORDER — SODIUM CHLORIDE 0.9 % IV SOLN
INTRAVENOUS | Status: AC
  Administered 2012-06-05: 11:00:00 via INTRAVENOUS

## 2012-06-12 ENCOUNTER — Ambulatory Visit (INDEPENDENT_AMBULATORY_CARE_PROVIDER_SITE_OTHER): Payer: Medicare Other | Admitting: *Deleted

## 2012-06-12 DIAGNOSIS — Z8679 Personal history of other diseases of the circulatory system: Secondary | ICD-10-CM

## 2012-06-12 DIAGNOSIS — I5023 Acute on chronic systolic (congestive) heart failure: Secondary | ICD-10-CM | POA: Diagnosis not present

## 2012-06-12 DIAGNOSIS — I4891 Unspecified atrial fibrillation: Secondary | ICD-10-CM

## 2012-06-12 DIAGNOSIS — Z7901 Long term (current) use of anticoagulants: Secondary | ICD-10-CM

## 2012-06-12 DIAGNOSIS — I4892 Unspecified atrial flutter: Secondary | ICD-10-CM | POA: Diagnosis not present

## 2012-06-12 LAB — POCT INR: INR: 2.6

## 2012-06-15 DIAGNOSIS — Z23 Encounter for immunization: Secondary | ICD-10-CM | POA: Diagnosis not present

## 2012-06-19 ENCOUNTER — Other Ambulatory Visit: Payer: Self-pay | Admitting: *Deleted

## 2012-06-19 MED ORDER — WARFARIN SODIUM 2.5 MG PO TABS: ORAL_TABLET | ORAL | Status: DC

## 2012-06-19 MED ORDER — FUROSEMIDE 40 MG PO TABS: ORAL_TABLET | ORAL | Status: DC

## 2012-07-02 ENCOUNTER — Other Ambulatory Visit: Payer: Self-pay | Admitting: *Deleted

## 2012-07-02 MED ORDER — FUROSEMIDE 40 MG PO TABS: ORAL_TABLET | ORAL | Status: DC

## 2012-07-08 ENCOUNTER — Other Ambulatory Visit: Payer: Self-pay | Admitting: *Deleted

## 2012-07-08 MED ORDER — WARFARIN SODIUM 2.5 MG PO TABS: ORAL_TABLET | ORAL | Status: DC

## 2012-07-17 ENCOUNTER — Ambulatory Visit (INDEPENDENT_AMBULATORY_CARE_PROVIDER_SITE_OTHER): Payer: Medicare Other

## 2012-07-17 DIAGNOSIS — I4892 Unspecified atrial flutter: Secondary | ICD-10-CM | POA: Diagnosis not present

## 2012-07-17 DIAGNOSIS — I4891 Unspecified atrial fibrillation: Secondary | ICD-10-CM | POA: Diagnosis not present

## 2012-07-17 DIAGNOSIS — Z8679 Personal history of other diseases of the circulatory system: Secondary | ICD-10-CM

## 2012-07-17 DIAGNOSIS — I5023 Acute on chronic systolic (congestive) heart failure: Secondary | ICD-10-CM

## 2012-07-17 DIAGNOSIS — Z7901 Long term (current) use of anticoagulants: Secondary | ICD-10-CM

## 2012-07-17 LAB — POCT INR: INR: 2.3

## 2012-08-08 ENCOUNTER — Encounter (HOSPITAL_COMMUNITY): Payer: Self-pay | Admitting: *Deleted

## 2012-08-08 ENCOUNTER — Emergency Department (HOSPITAL_COMMUNITY): Payer: Medicare Other

## 2012-08-08 ENCOUNTER — Emergency Department (HOSPITAL_COMMUNITY)
Admission: EM | Admit: 2012-08-08 | Discharge: 2012-08-09 | Disposition: A | Discharge status: Home / Self Care | Payer: Medicare Other | Attending: Emergency Medicine | Admitting: Emergency Medicine

## 2012-08-08 DIAGNOSIS — Y929 Unspecified place or not applicable: Secondary | ICD-10-CM | POA: Insufficient documentation

## 2012-08-08 DIAGNOSIS — D649 Anemia, unspecified: Secondary | ICD-10-CM | POA: Diagnosis not present

## 2012-08-08 DIAGNOSIS — R51 Headache: Secondary | ICD-10-CM | POA: Diagnosis not present

## 2012-08-08 DIAGNOSIS — I509 Heart failure, unspecified: Secondary | ICD-10-CM | POA: Insufficient documentation

## 2012-08-08 DIAGNOSIS — Z87891 Personal history of nicotine dependence: Secondary | ICD-10-CM | POA: Insufficient documentation

## 2012-08-08 DIAGNOSIS — I10 Essential (primary) hypertension: Secondary | ICD-10-CM | POA: Insufficient documentation

## 2012-08-08 DIAGNOSIS — Z8674 Personal history of sudden cardiac arrest: Secondary | ICD-10-CM | POA: Diagnosis not present

## 2012-08-08 DIAGNOSIS — M542 Cervicalgia: Secondary | ICD-10-CM | POA: Diagnosis not present

## 2012-08-08 DIAGNOSIS — Z95810 Presence of automatic (implantable) cardiac defibrillator: Secondary | ICD-10-CM | POA: Insufficient documentation

## 2012-08-08 DIAGNOSIS — I4891 Unspecified atrial fibrillation: Secondary | ICD-10-CM | POA: Insufficient documentation

## 2012-08-08 DIAGNOSIS — W108XXA Fall (on) (from) other stairs and steps, initial encounter: Secondary | ICD-10-CM | POA: Insufficient documentation

## 2012-08-08 DIAGNOSIS — Z7901 Long term (current) use of anticoagulants: Secondary | ICD-10-CM | POA: Diagnosis not present

## 2012-08-08 DIAGNOSIS — IMO0002 Reserved for concepts with insufficient information to code with codable children: Secondary | ICD-10-CM | POA: Diagnosis not present

## 2012-08-08 DIAGNOSIS — S199XXA Unspecified injury of neck, initial encounter: Secondary | ICD-10-CM | POA: Diagnosis not present

## 2012-08-08 DIAGNOSIS — M949 Disorder of cartilage, unspecified: Secondary | ICD-10-CM | POA: Insufficient documentation

## 2012-08-08 DIAGNOSIS — M899 Disorder of bone, unspecified: Secondary | ICD-10-CM | POA: Insufficient documentation

## 2012-08-08 DIAGNOSIS — S20229A Contusion of unspecified back wall of thorax, initial encounter: Secondary | ICD-10-CM | POA: Insufficient documentation

## 2012-08-08 DIAGNOSIS — S0993XA Unspecified injury of face, initial encounter: Secondary | ICD-10-CM | POA: Diagnosis not present

## 2012-08-08 DIAGNOSIS — S0990XA Unspecified injury of head, initial encounter: Secondary | ICD-10-CM | POA: Diagnosis not present

## 2012-08-08 DIAGNOSIS — M549 Dorsalgia, unspecified: Secondary | ICD-10-CM | POA: Diagnosis not present

## 2012-08-08 DIAGNOSIS — Z79899 Other long term (current) drug therapy: Secondary | ICD-10-CM | POA: Insufficient documentation

## 2012-08-08 DIAGNOSIS — T148XXA Other injury of unspecified body region, initial encounter: Secondary | ICD-10-CM | POA: Diagnosis not present

## 2012-08-08 DIAGNOSIS — Y9389 Activity, other specified: Secondary | ICD-10-CM | POA: Insufficient documentation

## 2012-08-08 LAB — CBC
HCT: 42.2 % (ref 36.0–46.0)
Hemoglobin: 13.8 g/dL (ref 12.0–15.0)
MCH: 29.4 pg (ref 26.0–34.0)
MCHC: 32.7 g/dL (ref 30.0–36.0)
MCV: 90 fL (ref 78.0–100.0)
Platelets: 207 10*3/uL (ref 150–400)
RBC: 4.69 MIL/uL (ref 3.87–5.11)
RDW: 23.5 % — ABNORMAL HIGH (ref 11.5–15.5)
WBC: 7.2 10*3/uL (ref 4.0–10.5)

## 2012-08-08 LAB — PROTIME-INR
INR: 2.2 — ABNORMAL HIGH (ref 0.00–1.49)
Prothrombin Time: 23.5 seconds — ABNORMAL HIGH (ref 11.6–15.2)

## 2012-08-08 LAB — BASIC METABOLIC PANEL
BUN: 23 mg/dL (ref 6–23)
CO2: 25 mEq/L (ref 19–32)
Calcium: 9.2 mg/dL (ref 8.4–10.5)
Chloride: 106 mEq/L (ref 96–112)
Creatinine, Ser: 1.32 mg/dL — ABNORMAL HIGH (ref 0.50–1.10)
GFR calc Af Amer: 45 mL/min — ABNORMAL LOW (ref >90)
GFR calc non Af Amer: 38 mL/min — ABNORMAL LOW (ref >90)
Glucose, Bld: 100 mg/dL — ABNORMAL HIGH (ref 70–99)
Potassium: 3.7 mEq/L (ref 3.5–5.1)
Sodium: 140 mEq/L (ref 135–145)

## 2012-08-08 MED ORDER — MORPHINE SULFATE 4 MG/ML IJ SOLN
6.0000 mg | Freq: Once | INTRAMUSCULAR | Status: AC
  Administered 2012-08-08: 6 mg via INTRAMUSCULAR
  Filled 2012-08-08: qty 2

## 2012-08-08 MED ORDER — MORPHINE SULFATE 4 MG/ML IJ SOLN: 6.0000 mg | Freq: Once | INTRAMUSCULAR | Status: DC

## 2012-08-08 MED ORDER — ONDANSETRON HCL 4 MG/2ML IJ SOLN: 4.0000 mg | Freq: Once | INTRAMUSCULAR | Status: DC

## 2012-08-08 MED ORDER — OXYCODONE-ACETAMINOPHEN 5-325 MG PO TABS: 1.0000 | ORAL_TABLET | ORAL | Status: DC | PRN

## 2012-08-08 NOTE — ED Provider Notes (Signed)
History    77 year old female with headache and buttock pain after mechanical fall. Patient states that she missed the last step and fell backwards and struck the back of her head. No loss of consciousness. Persistent headache since then. Also pain in her tailbone. Has not tried any bleeding since her fall. Patient is on Coumadin. Denies any visual complaints. No nausea or vomiting. No acute numbness, tingling or loss of strength. No chest pain or shortness of breath.   CSN: [id number]  Arrival date & time 08/08/12  2008   First MD Initiated Contact with Patient 08/08/12 2010      Chief Complaint  Patient presents with  . Fall    (Consider location/radiation/quality/duration/timing/severity/associated sxs/prior treatment) HPI  Past Medical History  Diagnosis Date  . Cardiac arrest - ventricular fibrillation 12/10    with successful resucitation, S/p ICD  . ICD (implantable cardiac defibrillator) in place     she has received appropriate therapy for VF  . Atrial fibrillation or flutter   . Osteopenia   . HTN (hypertension)     moderate  . Nonischemic cardiomyopathy     followed by Dr Glori Luis at [facility]  . CHF (congestive heart failure)   . Anemia   . S/P colonoscopy   . Plasma cell disorder 03/20/2012  . Plasma cell disorder 03/20/2012    Past Surgical History  Procedure Date  . Abdominal hysterectomy   . Cardiac catheterization   . Cardiac defibrillator placement     by JA for secondary prevention of sudden death    Family History  Problem Relation Age of Onset  . Stroke Brother   . Stroke Mother     History  Substance Use Topics  . Smoking status: Former Games developer  . Smokeless tobacco: Never Used     Comment: remote history of tobacco abuse  . Alcohol Use: No    OB History    Grav Para Term Preterm Abortions TAB SAB Ect Mult Living                  Review of Systems   Review of symptoms negative unless otherwise noted in HPI.   Allergies  Review of  patient's allergies indicates no known allergies.  Home Medications   Current Outpatient Rx  Name  Route  Sig  Dispense  Refill  . AMIODARONE HCL 200 MG PO TABS   Oral   Take 1 tablet (200 mg total) by mouth daily.   30 tablet   6   . CARVEDILOL 25 MG PO TABS   Oral   Take 1 tablet (25 mg total) by mouth 2 (two) times daily with a meal.   90 tablet   4   . VITAMIN D3 1000 UNITS PO CAPS   Oral   Take 1 tablet by mouth daily.           . FUROSEMIDE 40 MG PO TABS      Take 2 tabs in the morning and 1 tab in the evening for a total of 3 tabs a day   270 tablet   2   . LOSARTAN POTASSIUM 25 MG PO TABS   Oral   Take 1 tablet by mouth daily.         [name] POTASSIUM CHLORIDE CRYS ER 20 MEQ PO TBCR   Oral   Take 20 mEq by mouth 2 (two) times daily.         . WARFARIN SODIUM 2.5 MG PO TABS  Take as directed per coumadin clinic   45 tablet   3     BP 129/78  Pulse 63  Temp 98.5 F (36.9 C) (Oral)  Resp 18  SpO2 92%  Physical Exam  Nursing note and vitals reviewed. Constitutional: She is oriented to person, place, and time. She appears well-developed and well-nourished. No distress.  HENT:  Head: Normocephalic and atraumatic.       Some mild tenderness to the posterior scalp. No hematoma noted. No laceration.   Eyes: Conjunctivae normal are normal. Pupils are equal, round, and reactive to light. Right eye exhibits no discharge. Left eye exhibits no discharge.  Neck: Neck supple.  Cardiovascular: Normal rate, regular rhythm and normal heart sounds.  Exam reveals no gallop and no friction rub.   No murmur heard. Pulmonary/Chest: Effort normal and breath sounds normal. No respiratory distress.  Abdominal: Soft. She exhibits no distension. There is no tenderness.  Musculoskeletal: She exhibits no edema and no tenderness.        Tenderness in the lower lumbar and sacral region. No midline cervical spinal tenderness. No bony tenderness the extremities. Pelvis is  stable to rocking. No significant hip pain with range of motion of bilateral hips.  Neurological: She is alert and oriented to person, place, and time. No cranial nerve deficit. She exhibits normal muscle tone. Coordination normal.        Good finger to nose testing bilaterally  Skin: Skin is warm and dry.  Psychiatric: She has a normal mood and affect. Her behavior is normal. Thought content normal.    ED Course  Procedures (including critical care time)  Labs Reviewed  CBC - Abnormal; Notable for the following:    RDW 23.5 (*)     All other components within normal limits  BASIC METABOLIC PANEL - Abnormal; Notable for the following:    Glucose, Bld 100 (*)     Creatinine, Ser 1.32 (*)     GFR calc non Af Amer 38 (*)     GFR calc Af Amer 45 (*)     All other components within normal limits  PROTIME-INR - Abnormal; Notable for the following:    Prothrombin Time 23.5 (*)     INR 2.20 (*)     All other components within normal limits  LAB REPORT - SCANNED   No results found.   1. Closed head injury   2. Back contusion       MDM  77 year old female with head injury on Coumadin. Nonfocal neurological examination. CT of the head. Patient with sacral tenderness. Will obtain plain films. Given possible distracting injury, we'll also CT her neck. Basic labs. Pain medication. We'll continue to observe and reassess.  Imaging subsequently negative for acute emergent injury. Patient reports feeling better. Suspect contusion. INR is in an appropriate range. Head injury instructions were discussed. Outpatient followup as needed otherwise.        Raeford Razor, MD 08/12/12 [phone]

## 2012-08-08 NOTE — ED Notes (Signed)
PT reports falling backwards after missing a step tonight. Pt hit the posterior lower head. Pt reports pain at area. NO LOC A/O x4 No results found for this or any previous visit. Neuro intact.

## 2012-08-09 NOTE — ED Notes (Signed)
Patient given copy of discharge paperwork; went over discharge instructions with patient.  Patient instructed to take Percocet as directed, to follow up with primary care physician as needed, and to return to the ED for new, worsening, or concerning symptoms.  Instructed not to drive/drink while taking Percocet.

## 2012-08-24 ENCOUNTER — Encounter: Payer: Medicare Other | Admitting: *Deleted

## 2012-08-25 ENCOUNTER — Other Ambulatory Visit: Payer: Self-pay

## 2012-08-25 ENCOUNTER — Encounter: Payer: Self-pay | Admitting: *Deleted

## 2012-08-25 MED ORDER — AMIODARONE HCL 200 MG PO TABS: 200.0000 mg | ORAL_TABLET | Freq: Every day | ORAL | Status: DC

## 2012-08-28 ENCOUNTER — Ambulatory Visit (INDEPENDENT_AMBULATORY_CARE_PROVIDER_SITE_OTHER): Payer: Medicare Other | Admitting: *Deleted

## 2012-08-28 ENCOUNTER — Telehealth: Payer: Self-pay | Admitting: Oncology

## 2012-08-28 DIAGNOSIS — I4891 Unspecified atrial fibrillation: Secondary | ICD-10-CM

## 2012-08-28 DIAGNOSIS — I5023 Acute on chronic systolic (congestive) heart failure: Secondary | ICD-10-CM | POA: Diagnosis not present

## 2012-08-28 DIAGNOSIS — Z8679 Personal history of other diseases of the circulatory system: Secondary | ICD-10-CM

## 2012-08-28 DIAGNOSIS — Z7901 Long term (current) use of anticoagulants: Secondary | ICD-10-CM

## 2012-08-28 DIAGNOSIS — I4892 Unspecified atrial flutter: Secondary | ICD-10-CM

## 2012-08-28 LAB — POCT INR: INR: 3.3

## 2012-08-28 NOTE — Telephone Encounter (Signed)
lvm for pt regarding r/s 2.4.14 appt to 2.19.14.....mailed pt new appt schedule

## 2012-08-31 ENCOUNTER — Encounter: Payer: Self-pay | Admitting: Internal Medicine

## 2012-08-31 ENCOUNTER — Ambulatory Visit (INDEPENDENT_AMBULATORY_CARE_PROVIDER_SITE_OTHER): Payer: Medicare Other | Admitting: *Deleted

## 2012-08-31 DIAGNOSIS — I5023 Acute on chronic systolic (congestive) heart failure: Secondary | ICD-10-CM | POA: Diagnosis not present

## 2012-08-31 DIAGNOSIS — I4901 Ventricular fibrillation: Secondary | ICD-10-CM

## 2012-08-31 DIAGNOSIS — Z95810 Presence of automatic (implantable) cardiac defibrillator: Secondary | ICD-10-CM | POA: Diagnosis not present

## 2012-09-03 LAB — REMOTE ICD DEVICE
AL AMPLITUDE: 3.9 mv
AL IMPEDENCE ICD: 380 Ohm
ATRIAL PACING ICD: 57 pct
BAMS-0001: 150 {beats}/min
BAMS-0003: 60 {beats}/min
DEV-0020ICD: NEGATIVE
DEVICE MODEL ICD: 754815
HV IMPEDENCE: 47 Ohm
RV LEAD AMPLITUDE: 11.4 mv
RV LEAD IMPEDENCE ICD: 400 Ohm
TZON-0003SLOWVT: 350 ms
TZON-0004SLOWVT: 24
TZON-0005SLOWVT: 6
TZON-0010SLOWVT: 80 ms
VENTRICULAR PACING ICD: 1 pct

## 2012-09-08 ENCOUNTER — Other Ambulatory Visit: Payer: Self-pay | Admitting: *Deleted

## 2012-09-08 ENCOUNTER — Encounter: Payer: Self-pay | Admitting: *Deleted

## 2012-09-08 MED ORDER — CARVEDILOL 25 MG PO TABS: 25.0000 mg | ORAL_TABLET | Freq: Two times a day (BID) | ORAL | Status: DC

## 2012-09-08 NOTE — Telephone Encounter (Signed)
Pharmacy calling for rx refill Carve 25 bid, #[phone], 1 refill     Sani Loiseau CMA

## 2012-09-22 ENCOUNTER — Other Ambulatory Visit: Payer: Self-pay | Admitting: *Deleted

## 2012-09-22 MED ORDER — WARFARIN SODIUM 2.5 MG PO TABS: 2.5000 mg | ORAL_TABLET | ORAL | Status: DC

## 2012-09-25 ENCOUNTER — Ambulatory Visit (INDEPENDENT_AMBULATORY_CARE_PROVIDER_SITE_OTHER): Payer: Medicare Other

## 2012-09-25 DIAGNOSIS — I5023 Acute on chronic systolic (congestive) heart failure: Secondary | ICD-10-CM | POA: Diagnosis not present

## 2012-09-25 DIAGNOSIS — Z8679 Personal history of other diseases of the circulatory system: Secondary | ICD-10-CM | POA: Diagnosis not present

## 2012-09-25 DIAGNOSIS — Z7901 Long term (current) use of anticoagulants: Secondary | ICD-10-CM

## 2012-09-25 DIAGNOSIS — I4891 Unspecified atrial fibrillation: Secondary | ICD-10-CM

## 2012-09-25 DIAGNOSIS — I4892 Unspecified atrial flutter: Secondary | ICD-10-CM | POA: Diagnosis not present

## 2012-09-25 LAB — POCT INR: INR: 2.4

## 2012-10-06 ENCOUNTER — Ambulatory Visit: Payer: Medicare Other | Admitting: Oncology

## 2012-10-06 ENCOUNTER — Other Ambulatory Visit: Payer: Medicare Other | Admitting: Lab

## 2012-10-21 ENCOUNTER — Telehealth: Payer: Self-pay | Admitting: Oncology

## 2012-10-21 ENCOUNTER — Ambulatory Visit (HOSPITAL_BASED_OUTPATIENT_CLINIC_OR_DEPARTMENT_OTHER): Payer: Medicare Other | Admitting: Oncology

## 2012-10-21 ENCOUNTER — Other Ambulatory Visit: Payer: Medicare Other | Admitting: Lab

## 2012-10-21 VITALS — BP 117/73 | HR 67 | Temp 97.8°F | Resp 18 | Ht 60.0 in | Wt 170.5 lb

## 2012-10-21 DIAGNOSIS — D649 Anemia, unspecified: Secondary | ICD-10-CM

## 2012-10-21 DIAGNOSIS — D472 Monoclonal gammopathy: Secondary | ICD-10-CM | POA: Diagnosis not present

## 2012-10-21 DIAGNOSIS — D7289 Other specified disorders of white blood cells: Secondary | ICD-10-CM | POA: Diagnosis not present

## 2012-10-21 DIAGNOSIS — D729 Disorder of white blood cells, unspecified: Secondary | ICD-10-CM

## 2012-10-21 LAB — CBC WITH DIFFERENTIAL/PLATELET
BASO%: 0.7 % (ref 0.0–2.0)
Basophils Absolute: 0 10*3/uL (ref 0.0–0.1)
EOS%: 2.6 % (ref 0.0–7.0)
Eosinophils Absolute: 0.1 10*3/uL (ref 0.0–0.5)
HCT: 38.5 % (ref 34.8–46.6)
HGB: 12.5 g/dL (ref 11.6–15.9)
LYMPH%: 29.7 % (ref 14.0–49.7)
MCH: 30.5 pg (ref 25.1–34.0)
MCHC: 32.5 g/dL (ref 31.5–36.0)
MCV: 93.8 fL (ref 79.5–101.0)
MONO#: 0.7 10*3/uL (ref 0.1–0.9)
MONO%: 12.2 % (ref 0.0–14.0)
NEUT#: 3 10*3/uL (ref 1.5–6.5)
NEUT%: 54.8 % (ref 38.4–76.8)
Platelets: 179 10*3/uL (ref 145–400)
RBC: 4.1 10*6/uL (ref 3.70–5.45)
RDW: 15.5 % — ABNORMAL HIGH (ref 11.2–14.5)
WBC: 5.4 10*3/uL (ref 3.9–10.3)
lymph#: 1.6 10*3/uL (ref 0.9–3.3)

## 2012-10-21 LAB — COMPREHENSIVE METABOLIC PANEL (CC13)
ALT: 23 U/L (ref 0–55)
AST: 19 U/L (ref 5–34)
Albumin: 3 g/dL — ABNORMAL LOW (ref 3.5–5.0)
Alkaline Phosphatase: 113 U/L (ref 40–150)
BUN: 15.9 mg/dL (ref 7.0–26.0)
CO2: 26 mEq/L (ref 22–29)
Calcium: 9.3 mg/dL (ref 8.4–10.4)
Chloride: 110 mEq/L — ABNORMAL HIGH (ref 98–107)
Creatinine: 1.2 mg/dL — ABNORMAL HIGH (ref 0.6–1.1)
Glucose: 77 mg/dl (ref 70–99)
Potassium: 4.4 mEq/L (ref 3.5–5.1)
Sodium: 144 mEq/L (ref 136–145)
Total Bilirubin: 0.62 mg/dL (ref 0.20–1.20)
Total Protein: 7.5 g/dL (ref 6.4–8.3)

## 2012-10-21 NOTE — Telephone Encounter (Signed)
gv and printed appt schedule for pt for Aug °

## 2012-10-21 NOTE — Progress Notes (Signed)
Hematology and Oncology Follow Up Visit  Brianna Hanson [id number] December 16, 1934 78 y.o. 10/21/2012 9:50 AM     Principle Diagnosis: 78 year old with plasma cell disorder. Likely IgA MGUS and less likely Myeloma.   Current therapy: observation and surveillance.   Interim History:  This is A 78 year old woman returns today for a follow up visit. She was seen on 7/23 for the evaluation of a plasma cell disorder.  She is asymptomatic at this point. She is not reporting any back pain. Does not report any shoulder pain. She is not reporting any pathological fracture. Has not reported any change in her activity level or performance status. She is rather functional, although she does not drive, but able to perform most activities of daily living without any hindrance or decline. She had not had any peripheral neuropathy. Had not reported any immune dysregulation or recurrent sinopulmonary infection. She reports no hospitalizations or illnesses.    Medications: I have reviewed the patient's current medications. Current outpatient prescriptions:amiodarone (PACERONE) 200 MG tablet, Take 1 tablet (200 mg total) by mouth daily., Disp: 30 tablet, Rfl: 6;  carvedilol (COREG) 25 MG tablet, Take 1 tablet (25 mg total) by mouth 2 (two) times daily with a meal., Disp: 180 tablet, Rfl: 1;  Cholecalciferol (VITAMIN D3) 1000 UNITS CAPS, Take 1 tablet by mouth daily.  , Disp: , Rfl:  furosemide (LASIX) 40 MG tablet, Take 2 tabs in the morning and 1 tab in the evening for a total of 3 tabs a day, Disp: 270 tablet, Rfl: 2;  losartan (COZAAR) 25 MG tablet, Take 1 tablet by mouth daily., Disp: , Rfl: ;  oxyCODONE-acetaminophen (PERCOCET/ROXICET) 5-325 MG per tablet, Take 1 tablet by mouth every 4 (four) hours as needed for pain., Disp: 10 tablet, Rfl: 0 potassium chloride SA (K-DUR,KLOR-CON) 20 MEQ tablet, Take 20 mEq by mouth 2 (two) times daily., Disp: , Rfl: ;  warfarin (COUMADIN) 2.5 MG tablet, Take 1 tablet (2.5 mg total) by  mouth as directed., Disp: 45 tablet, Rfl: 3  Allergies: No Known Allergies  Past Medical History, Surgical history, Social history, and Family History were reviewed and updated.  Review of Systems: Constitutional:  Negative for fever, chills, night sweats, anorexia, weight loss, pain. Cardiovascular: no chest pain or dyspnea on exertion Respiratory: negative Neurological: negative Dermatological: negative ENT: negative Skin: Negative. Gastrointestinal: negative Genito-Urinary: negative Hematological and Lymphatic: negative Breast: negative Musculoskeletal: negative Remaining ROS negative. Physical Exam: Blood pressure 117/73, pulse 67, temperature 97.8 F (36.6 C), temperature source Oral, resp. rate 18, height 5' (1.524 m), weight 170 lb 8 oz (77.338 kg). ECOG: 1 General appearance: alert Head: Normocephalic, without obvious abnormality, atraumatic Neck: no adenopathy, no carotid bruit, no JVD, supple, symmetrical, trachea midline and thyroid not enlarged, symmetric, no tenderness/mass/nodules Lymph nodes: Cervical, supraclavicular, and axillary nodes normal. Heart:regular rate and rhythm, S1, S2 normal, no murmur, click, rub or gallop Lung:chest clear, no wheezing, rales, normal symmetric air entry Abdomin: soft, non-tender, without masses or organomegaly EXT:no erythema, induration, or nodules   Lab Results: Lab Results  Component Value Date   WBC 5.4 10/21/2012   HGB 12.5 10/21/2012   HCT 38.5 10/21/2012   MCV 93.8 10/21/2012   PLT 179 10/21/2012     Chemistry      Component Value Date/Time   NA 140 08/08/2012 2054   K 3.7 08/08/2012 2054   CL 106 08/08/2012 2054   CO2 25 08/08/2012 2054   BUN 23 08/08/2012 2054   CREATININE  1.32* 08/08/2012 2054      Component Value Date/Time   CALCIUM 9.2 08/08/2012 2054   ALKPHOS 78 03/24/2012 1042   AST 23 03/24/2012 1042   ALT 15 03/24/2012 1042   BILITOT 0.4 03/24/2012 1042     Results for Brianna Hanson (MRN [id number]) as of  04/02/2012 15:31  Ref. Range 03/24/2012 10:42  Kappa free light chain Latest Range: 0.33-1.94 mg/dL 40.98 (H)  Kappa:Lambda Ratio Latest Range: 0.26-1.65  19.78 (H)  Lambda Free Lght Chn Latest Range: 0.57-2.63 mg/dL 1.19 (H)  Albumin ELP Latest Range: 55.8-66.1 % 44.3 (L)  COMMENT (PROTEIN ELECTROPHOR) No range found *  Alpha-1-Globulin Latest Range: 2.9-4.9 % 5.1 (H)  Alpha-2-Globulin Latest Range: 7.1-11.8 % 13.6 (H)  Beta Globulin Latest Range: 4.7-7.2 % 5.6  Beta 2 Latest Range: 3.2-6.5 % 16.5 (H)  Gamma Globulin Latest Range: 11.1-18.8 % 14.9  M-SPIKE, % No range found 0.65  SPE Interp. No range found *  IgG (Immunoglobin G), Serum Latest Range: 815-809-1131 mg/dL 1478  IgA Latest Range: 69-380 mg/dL 295 (H)  IgM, Serum Latest Range: 52-322 mg/dL 85  Total Protein, serum electrophor Latest Range: 6.0-8.3 g/dL 7.0    Impression and Plan: A 78 year old woman with the following issues:  1. Monoclonal gammopathy, IgA subtype with an M-spike of 0.6 g/dL, IgA level of 621. The differential diagnosis include multiple myeloma (less likely given the lack of end organ damage) vs. monoclonal gammopathy of undetermined significance.  For now, I will continue to observe her and repeat protein studies in 6 months. If her M spike or her total IgA goes up or she develop signs of end organ damage, I will stage her with a bone marrow and consider treatments.   2. Anemia. Her hemoglobin is back to normal at this point.       [facility], MD 2/19/20149:50 AM

## 2012-10-23 ENCOUNTER — Ambulatory Visit (INDEPENDENT_AMBULATORY_CARE_PROVIDER_SITE_OTHER): Payer: Medicare Other

## 2012-10-23 DIAGNOSIS — Z8679 Personal history of other diseases of the circulatory system: Secondary | ICD-10-CM

## 2012-10-23 DIAGNOSIS — I4892 Unspecified atrial flutter: Secondary | ICD-10-CM | POA: Diagnosis not present

## 2012-10-23 DIAGNOSIS — I5023 Acute on chronic systolic (congestive) heart failure: Secondary | ICD-10-CM

## 2012-10-23 DIAGNOSIS — I4891 Unspecified atrial fibrillation: Secondary | ICD-10-CM | POA: Diagnosis not present

## 2012-10-23 DIAGNOSIS — Z7901 Long term (current) use of anticoagulants: Secondary | ICD-10-CM

## 2012-10-23 LAB — SPEP & IFE WITH QIG
Albumin ELP: 46.8 % — ABNORMAL LOW (ref 55.8–66.1)
Alpha-1-Globulin: 4.2 % (ref 2.9–4.9)
Alpha-2-Globulin: 11.3 % (ref 7.1–11.8)
Beta 2: 19.4 % — ABNORMAL HIGH (ref 3.2–6.5)
Beta Globulin: 5.2 % (ref 4.7–7.2)
Gamma Globulin: 13.1 % (ref 11.1–18.8)
IgA: 746 mg/dL — ABNORMAL HIGH (ref 69–380)
IgG (Immunoglobin G), Serum: 1130 mg/dL (ref 690–1700)
IgM, Serum: 73 mg/dL (ref 52–322)
M-Spike, %: 0.92 g/dL
Total Protein, Serum Electrophoresis: 7 g/dL (ref 6.0–8.3)

## 2012-10-23 LAB — POCT INR: INR: 2.4

## 2012-10-23 LAB — KAPPA/LAMBDA LIGHT CHAINS
Kappa free light chain: 48 mg/dL — ABNORMAL HIGH (ref 0.33–1.94)
Kappa:Lambda Ratio: 25.26 — ABNORMAL HIGH (ref 0.26–1.65)
Lambda Free Lght Chn: 1.9 mg/dL (ref 0.57–2.63)

## 2012-11-10 DIAGNOSIS — Z1231 Encounter for screening mammogram for malignant neoplasm of breast: Secondary | ICD-10-CM | POA: Diagnosis not present

## 2012-11-10 LAB — HM MAMMOGRAPHY: HM Mammogram: NORMAL

## 2012-11-20 ENCOUNTER — Ambulatory Visit (INDEPENDENT_AMBULATORY_CARE_PROVIDER_SITE_OTHER): Payer: Medicare Other

## 2012-11-20 DIAGNOSIS — I4891 Unspecified atrial fibrillation: Secondary | ICD-10-CM | POA: Diagnosis not present

## 2012-11-20 DIAGNOSIS — I5023 Acute on chronic systolic (congestive) heart failure: Secondary | ICD-10-CM | POA: Diagnosis not present

## 2012-11-20 DIAGNOSIS — I4892 Unspecified atrial flutter: Secondary | ICD-10-CM

## 2012-11-20 DIAGNOSIS — Z7901 Long term (current) use of anticoagulants: Secondary | ICD-10-CM

## 2012-11-20 DIAGNOSIS — Z8679 Personal history of other diseases of the circulatory system: Secondary | ICD-10-CM

## 2012-11-20 LAB — POCT INR: INR: 2.9

## 2012-11-30 ENCOUNTER — Encounter: Payer: Self-pay | Admitting: Internal Medicine

## 2012-11-30 ENCOUNTER — Other Ambulatory Visit: Payer: Self-pay | Admitting: Internal Medicine

## 2012-11-30 ENCOUNTER — Ambulatory Visit (INDEPENDENT_AMBULATORY_CARE_PROVIDER_SITE_OTHER): Payer: Medicare Other | Admitting: *Deleted

## 2012-11-30 DIAGNOSIS — Z95810 Presence of automatic (implantable) cardiac defibrillator: Secondary | ICD-10-CM | POA: Diagnosis not present

## 2012-11-30 DIAGNOSIS — I4901 Ventricular fibrillation: Secondary | ICD-10-CM

## 2012-11-30 DIAGNOSIS — I5023 Acute on chronic systolic (congestive) heart failure: Secondary | ICD-10-CM

## 2012-12-02 LAB — REMOTE ICD DEVICE
AL AMPLITUDE: 1.5 mv
AL IMPEDENCE ICD: 330 Ohm
ATRIAL PACING ICD: 59 pct
BAMS-0001: 150 {beats}/min
BAMS-0003: 60 {beats}/min
DEV-0020ICD: NEGATIVE
DEVICE MODEL ICD: 754815
HV IMPEDENCE: 46 Ohm
RV LEAD AMPLITUDE: 11.4 mv
RV LEAD IMPEDENCE ICD: 350 Ohm
TZON-0003SLOWVT: 350 ms
TZON-0004SLOWVT: 24
TZON-0005SLOWVT: 6
TZON-0010SLOWVT: 80 ms
VENTRICULAR PACING ICD: 1 pct

## 2012-12-15 ENCOUNTER — Encounter: Payer: Self-pay | Admitting: *Deleted

## 2013-01-01 ENCOUNTER — Ambulatory Visit (INDEPENDENT_AMBULATORY_CARE_PROVIDER_SITE_OTHER): Payer: Medicare Other | Admitting: *Deleted

## 2013-01-01 DIAGNOSIS — I4892 Unspecified atrial flutter: Secondary | ICD-10-CM

## 2013-01-01 DIAGNOSIS — I5023 Acute on chronic systolic (congestive) heart failure: Secondary | ICD-10-CM

## 2013-01-01 DIAGNOSIS — I4891 Unspecified atrial fibrillation: Secondary | ICD-10-CM

## 2013-01-01 DIAGNOSIS — Z8679 Personal history of other diseases of the circulatory system: Secondary | ICD-10-CM | POA: Diagnosis not present

## 2013-01-01 DIAGNOSIS — Z7901 Long term (current) use of anticoagulants: Secondary | ICD-10-CM

## 2013-01-01 LAB — POCT INR: INR: 2.5

## 2013-01-13 ENCOUNTER — Encounter: Payer: Self-pay | Admitting: Internal Medicine

## 2013-01-13 ENCOUNTER — Other Ambulatory Visit (INDEPENDENT_AMBULATORY_CARE_PROVIDER_SITE_OTHER): Payer: Medicare Other

## 2013-01-13 ENCOUNTER — Ambulatory Visit (INDEPENDENT_AMBULATORY_CARE_PROVIDER_SITE_OTHER): Payer: Medicare Other | Admitting: Internal Medicine

## 2013-01-13 VITALS — BP 110/60 | HR 61 | Temp 97.6°F | Resp 16 | Wt 175.0 lb

## 2013-01-13 DIAGNOSIS — R7309 Other abnormal glucose: Secondary | ICD-10-CM | POA: Diagnosis not present

## 2013-01-13 DIAGNOSIS — I4891 Unspecified atrial fibrillation: Secondary | ICD-10-CM | POA: Diagnosis not present

## 2013-01-13 DIAGNOSIS — D7289 Other specified disorders of white blood cells: Secondary | ICD-10-CM

## 2013-01-13 DIAGNOSIS — I10 Essential (primary) hypertension: Secondary | ICD-10-CM

## 2013-01-13 DIAGNOSIS — R0600 Dyspnea, unspecified: Secondary | ICD-10-CM

## 2013-01-13 DIAGNOSIS — D729 Disorder of white blood cells, unspecified: Secondary | ICD-10-CM

## 2013-01-13 DIAGNOSIS — R0609 Other forms of dyspnea: Secondary | ICD-10-CM

## 2013-01-13 DIAGNOSIS — N182 Chronic kidney disease, stage 2 (mild): Secondary | ICD-10-CM | POA: Diagnosis not present

## 2013-01-13 DIAGNOSIS — I5023 Acute on chronic systolic (congestive) heart failure: Secondary | ICD-10-CM

## 2013-01-13 LAB — CBC WITH DIFFERENTIAL/PLATELET
Basophils Absolute: 0 10*3/uL (ref 0.0–0.1)
Basophils Relative: 0.7 % (ref 0.0–3.0)
Eosinophils Absolute: 0.1 10*3/uL (ref 0.0–0.7)
Eosinophils Relative: 1.7 % (ref 0.0–5.0)
HCT: 40.3 % (ref 36.0–46.0)
Hemoglobin: 13.7 g/dL (ref 12.0–15.0)
Lymphocytes Relative: 36.1 % (ref 12.0–46.0)
Lymphs Abs: 2.1 10*3/uL (ref 0.7–4.0)
MCHC: 33.9 g/dL (ref 30.0–36.0)
MCV: 93.8 fl (ref 78.0–100.0)
Monocytes Absolute: 0.7 10*3/uL (ref 0.1–1.0)
Monocytes Relative: 11.4 % (ref 3.0–12.0)
Neutro Abs: 2.9 10*3/uL (ref 1.4–7.7)
Neutrophils Relative %: 50.1 % (ref 43.0–77.0)
Platelets: 196 10*3/uL (ref 150.0–400.0)
RBC: 4.3 Mil/uL (ref 3.87–5.11)
RDW: 17 % — ABNORMAL HIGH (ref 11.5–14.6)
WBC: 5.9 10*3/uL (ref 4.5–10.5)

## 2013-01-13 LAB — COMPREHENSIVE METABOLIC PANEL
ALT: 27 U/L (ref 0–35)
AST: 25 U/L (ref 0–37)
Albumin: 3.1 g/dL — ABNORMAL LOW (ref 3.5–5.2)
Alkaline Phosphatase: 86 U/L (ref 39–117)
BUN: 13 mg/dL (ref 6–23)
CO2: 27 mEq/L (ref 19–32)
Calcium: 8.6 mg/dL (ref 8.4–10.5)
Chloride: 109 mEq/L (ref 96–112)
Creatinine, Ser: 1.3 mg/dL — ABNORMAL HIGH (ref 0.4–1.2)
GFR: 50.31 mL/min — ABNORMAL LOW (ref >60.00)
Glucose, Bld: 87 mg/dL (ref 70–99)
Potassium: 3.9 mEq/L (ref 3.5–5.1)
Sodium: 141 mEq/L (ref 135–145)
Total Bilirubin: 0.7 mg/dL (ref 0.3–1.2)
Total Protein: 6.6 g/dL (ref 6.0–8.3)

## 2013-01-13 LAB — TSH: TSH: 2.99 u[IU]/mL (ref 0.35–5.50)

## 2013-01-13 LAB — BRAIN NATRIURETIC PEPTIDE: Pro B Natriuretic peptide (BNP): 1614 pg/mL — ABNORMAL HIGH (ref 0.0–100.0)

## 2013-01-13 LAB — HEMOGLOBIN A1C: Hgb A1c MFr Bld: 6 % (ref 4.6–6.5)

## 2013-01-13 NOTE — Patient Instructions (Addendum)

## 2013-01-13 NOTE — Progress Notes (Signed)
Subjective:    Patient ID: Brianna Hanson, female    DOB: 07/31/1935, 78 y.o.   MRN: [id number]  Shortness of Breath This is a new problem. The current episode started 1 to 4 weeks ago. The problem occurs intermittently. The problem has been unchanged. Associated symptoms include leg swelling. Pertinent negatives include no abdominal pain, chest pain, claudication, coryza, ear pain, fever, headaches, hemoptysis, leg pain, neck pain, orthopnea, PND, rash, rhinorrhea, sore throat, sputum production, swollen glands, syncope, vomiting or wheezing. Nothing aggravates the symptoms. The patient has no known risk factors for DVT/PE. Her past medical history is significant for a heart failure. There is no history of DVT or a recent surgery.      Review of Systems  Constitutional: Negative.  Negative for fever, chills, diaphoresis, activity change, appetite change, fatigue and unexpected weight change.  HENT: Negative.  Negative for ear pain, sore throat, rhinorrhea and neck pain.   Eyes: Negative.   Respiratory: Positive for shortness of breath. Negative for apnea, cough, hemoptysis, sputum production, choking, chest tightness, wheezing and stridor.   Cardiovascular: Positive for leg swelling. Negative for chest pain, palpitations, orthopnea, claudication, syncope and PND.  Gastrointestinal: Negative.  Negative for nausea, vomiting, abdominal pain, diarrhea and blood in stool.  Endocrine: Negative.  Negative for polydipsia, polyphagia and polyuria.  Genitourinary: Negative.   Musculoskeletal: Negative.   Skin: Negative.  Negative for rash.  Allergic/Immunologic: Negative.   Neurological: Negative.  Negative for dizziness, tremors, syncope, weakness, light-headedness, numbness and headaches.  Hematological: Negative.  Negative for adenopathy. Does not bruise/bleed easily.  Psychiatric/Behavioral: Negative.        Objective:   Physical Exam  Vitals reviewed. Constitutional: She is oriented to  person, place, and time. She appears well-developed and well-nourished. No distress.  HENT:  Head: Normocephalic and atraumatic.  Mouth/Throat: Oropharynx is clear and moist. No oropharyngeal exudate.  Eyes: Conjunctivae are normal. Right eye exhibits no discharge. Left eye exhibits no discharge. No scleral icterus.  Neck: Normal range of motion. Neck supple. No JVD present. No tracheal deviation present. No thyromegaly present.  Cardiovascular: Normal rate, regular rhythm, S1 normal, S2 normal, normal heart sounds and intact distal pulses.  Exam reveals no gallop.   No murmur heard.  No systolic murmur is present   No diastolic murmur is present  Pulses:      Carotid pulses are 1+ on the right side, and 1+ on the left side.      Radial pulses are 1+ on the right side, and 1+ on the left side.       Femoral pulses are 1+ on the right side, and 1+ on the left side.      Popliteal pulses are 1+ on the right side, and 1+ on the left side.       Dorsalis pedis pulses are 1+ on the right side, and 1+ on the left side.       Posterior tibial pulses are 1+ on the right side, and 1+ on the left side.  Pulmonary/Chest: Effort normal and breath sounds normal. No stridor. No respiratory distress. She has no wheezes. She has no rales. She exhibits no tenderness.  Abdominal: Soft. Bowel sounds are normal. She exhibits no distension and no mass. There is no tenderness. There is no rebound and no guarding.  Musculoskeletal: Normal range of motion. She exhibits edema (1+ pitting edema in BLE). She exhibits no tenderness.  Lymphadenopathy:    She has no cervical adenopathy.  Neurological: She  is oriented to person, place, and time.  Skin: Skin is warm and dry. No rash noted. She is not diaphoretic. No erythema. No pallor.  Psychiatric: She has a normal mood and affect. Her behavior is normal. Judgment and thought content normal.     Lab Results  Component Value Date   WBC 5.4 10/21/2012   HGB 12.5  10/21/2012   HCT 38.5 10/21/2012   PLT 179 10/21/2012   GLUCOSE 77 10/21/2012   CHOL  Value: 130        ATP III CLASSIFICATION:  <200     mg/dL   Desirable  161-096  mg/dL   Borderline High  >=045    mg/dL   High        4/0/9811   TRIG 90 05/05/2010   HDL 36* 05/05/2010   LDLCALC  Value: 76        Total Cholesterol/HDL:CHD Risk Coronary Heart Disease Risk Table                     Men   Women  1/2 Average Risk   3.4   3.3  Average Risk       5.0   4.4  2 X Average Risk   9.6   7.1  3 X Average Risk  23.4   11.0        Use the calculated Patient Ratio above and the CHD Risk Table to determine the patient's CHD Risk.        ATP III CLASSIFICATION (LDL):  <100     mg/dL   Optimal  914-782  mg/dL   Near or Above                    Optimal  130-159  mg/dL   Borderline  956-213  mg/dL   High  >086     mg/dL   Very High 01/07/8468   ALT 23 10/21/2012   AST 19 10/21/2012   NA 144 10/21/2012   K 4.4 10/21/2012   CL 110* 10/21/2012   CREATININE 1.2* 10/21/2012   BUN 15.9 10/21/2012   CO2 26 10/21/2012   TSH 2.23 02/03/2012   INR 2.5 01/01/2013   HGBA1C 6.1 02/03/2012       Assessment & Plan:

## 2013-01-14 NOTE — Assessment & Plan Note (Signed)
Her EKG is unchanged I think she is having a mild exacerbation of CHF I don't think her BP can tolerate a higher diuretic dose I have asked her to see cardiology as soon as possible

## 2013-01-14 NOTE — Assessment & Plan Note (Signed)
She has good rate and rhythm control 

## 2013-01-14 NOTE — Assessment & Plan Note (Signed)
Repeat CBC today 

## 2013-01-14 NOTE — Assessment & Plan Note (Signed)
Her renal function is stable 

## 2013-01-14 NOTE — Assessment & Plan Note (Signed)
Her EKG is unchanged Her BNP remains mildly elevated I have asked her to see cardiology

## 2013-01-15 ENCOUNTER — Encounter: Payer: Self-pay | Admitting: Internal Medicine

## 2013-01-15 ENCOUNTER — Ambulatory Visit (INDEPENDENT_AMBULATORY_CARE_PROVIDER_SITE_OTHER): Payer: Medicare Other | Admitting: Internal Medicine

## 2013-01-15 VITALS — BP 104/78 | HR 74 | Ht 60.0 in | Wt 175.1 lb

## 2013-01-15 DIAGNOSIS — I4891 Unspecified atrial fibrillation: Secondary | ICD-10-CM | POA: Diagnosis not present

## 2013-01-15 DIAGNOSIS — I4901 Ventricular fibrillation: Secondary | ICD-10-CM | POA: Diagnosis not present

## 2013-01-15 DIAGNOSIS — I428 Other cardiomyopathies: Secondary | ICD-10-CM

## 2013-01-15 DIAGNOSIS — I5023 Acute on chronic systolic (congestive) heart failure: Secondary | ICD-10-CM

## 2013-01-15 LAB — ICD DEVICE OBSERVATION
AL AMPLITUDE: 2 mv
AL IMPEDENCE ICD: 325 Ohm
AL THRESHOLD: 0.75 V
ATRIAL PACING ICD: 60 pct
BAMS-0001: 150 {beats}/min
BAMS-0003: 60 {beats}/min
CHARGE TIME: 9.5 s
DEV-0020ICD: NEGATIVE
DEVICE MODEL ICD: 754815
FVT: 0
HV IMPEDENCE: 42 Ohm
MODE SWITCH EPISODES: 0
PACEART VT: 0
RV LEAD AMPLITUDE: 11.4 mv
RV LEAD IMPEDENCE ICD: 350 Ohm
RV LEAD THRESHOLD: 0.75 V
TOT-0006: 20110104000000
TOT-0007: 1
TOT-0008: 0
TOT-0009: 1
TOT-0010: 14
TZON-0003SLOWVT: 350 ms
TZON-0004SLOWVT: 30
TZON-0005SLOWVT: 6
TZON-0010SLOWVT: 80 ms
VENTRICULAR PACING ICD: 0.16 pct
VF: 0

## 2013-01-15 LAB — BASIC METABOLIC PANEL
BUN: 15 mg/dL (ref 6–23)
CO2: 28 mEq/L (ref 19–32)
Calcium: 8.9 mg/dL (ref 8.4–10.5)
Chloride: 110 mEq/L (ref 96–112)
Creatinine, Ser: 1.3 mg/dL — ABNORMAL HIGH (ref 0.4–1.2)
GFR: 49.44 mL/min — ABNORMAL LOW (ref >60.00)
Glucose, Bld: 75 mg/dL (ref 70–99)
Potassium: 3.8 mEq/L (ref 3.5–5.1)
Sodium: 143 mEq/L (ref 135–145)

## 2013-01-15 MED ORDER — FUROSEMIDE 40 MG PO TABS: ORAL_TABLET | ORAL | Status: DC

## 2013-01-15 NOTE — Patient Instructions (Addendum)
You have been referred to Dr Gala Romney in the CHF clinic ----01/27/13 at 11:40 Code to get in parking garage is 0010   Dinah [phone]) at CHF clinic will mail you an information packet prior to your appointment  Your physician has recommended you make the following change in your medication:  1) Increase your Furosemide to 80mg  twice daily for 5 days, then go back to your original dose  Your physician recommends that you return for lab work today BMP   2 Gram Low Sodium Diet A 2 gram sodium diet restricts the amount of sodium in the diet to no more than 2 g or 2000 mg daily. Limiting the amount of sodium is often used to help lower blood pressure. It is important if you have heart, liver, or kidney problems. Many foods contain sodium for flavor and sometimes as a preservative. When the amount of sodium in a diet needs to be low, it is important to know what to look for when choosing foods and drinks. The following includes some information and guidelines to help make it easier for you to adapt to a low sodium diet. QUICK TIPS  Do not add salt to food.  Avoid convenience items and fast food.  Choose unsalted snack foods.  Buy lower sodium products, often labeled as "lower sodium" or "no salt added."  Check food labels to learn how much sodium is in 1 serving.  When eating at a restaurant, ask that your food be prepared with less salt or none, if possible. READING FOOD LABELS FOR SODIUM INFORMATION The nutrition facts label is a good place to find how much sodium is in foods. Look for products with no more than 500 to 600 mg of sodium per meal and no more than 150 mg per serving. Remember that 2 g = 2000 mg. The food label may also list foods as:  Sodium-free: Less than 5 mg in a serving.  Very low sodium: 35 mg or less in a serving.  Low-sodium: 140 mg or less in a serving.  Light in sodium: 50% less sodium in a serving. For example, if a food that usually has 300 mg of  sodium is changed to become light in sodium, it will have 150 mg of sodium.  Reduced sodium: 25% less sodium in a serving. For example, if a food that usually has 400 mg of sodium is changed to reduced sodium, it will have 300 mg of sodium. CHOOSING FOODS Grains  Avoid: Salted crackers and snack items. Some cereals, including instant hot cereals. Bread stuffing and biscuit mixes. Seasoned rice or pasta mixes.  Choose: Unsalted snack items. Low-sodium cereals, oats, puffed wheat and rice, shredded wheat. English muffins and bread. Pasta. Meats  Avoid: Salted, canned, smoked, spiced, pickled meats, including fish and poultry. Bacon, ham, sausage, cold cuts, hot dogs, anchovies.  Choose: Low-sodium canned tuna and salmon. Fresh or frozen meat, poultry, and fish. Dairy  Avoid: Processed cheese and spreads. Cottage cheese. Buttermilk and condensed milk. Regular cheese.  Choose: Milk. Low-sodium cottage cheese. Yogurt. Sour cream. Low-sodium cheese. Fruits and Vegetables  Avoid: Regular canned vegetables. Regular canned tomato sauce and paste. Frozen vegetables in sauces. Olives. Rosita Fire. Relishes. Sauerkraut.  Choose: Low-sodium canned vegetables. Low-sodium tomato sauce and paste. Frozen or fresh vegetables. Fresh and frozen fruit. Condiments  Avoid: Canned and packaged gravies. Worcestershire sauce. Tartar sauce. Barbecue sauce. Soy sauce. Steak sauce. Ketchup. Onion, garlic, and table salt. Meat flavorings and tenderizers.  Choose: Fresh and dried  herbs and spices. Low-sodium varieties of mustard and ketchup. Lemon juice. Tabasco sauce. Horseradish. SAMPLE 2 GRAM SODIUM MEAL PLAN Breakfast / Sodium (mg)  1 cup low-fat milk / 143 mg  2 slices whole-wheat toast / 270 mg  1 tbs heart-healthy margarine / 153 mg  1 hard-boiled egg / 139 mg  1 small orange / 0 mg Lunch / Sodium (mg)  1 cup raw carrots / 76 mg   cup hummus / 298 mg  1 cup low-fat milk / 143 mg   cup red  grapes / 2 mg  1 whole-wheat pita bread / 356 mg Dinner / Sodium (mg)  1 cup whole-wheat pasta / 2 mg  1 cup low-sodium tomato sauce / 73 mg  3 oz lean ground beef / 57 mg  1 small side salad (1 cup raw spinach leaves,  cup cucumber,  cup yellow bell pepper) with 1 tsp olive oil and 1 tsp red wine vinegar / 25 mg Snack / Sodium (mg)  1 container low-fat vanilla yogurt / 107 mg  3 graham cracker squares / 127 mg Nutrient Analysis  Calories: 2033  Protein: 77 g  Carbohydrate: 282 g  Fat: 72 g  Sodium: 1971 mg Document Released: 08/19/2005 Document Revised: 11/11/2011 Document Reviewed: 11/20/2009 [facility] Patient Information 2013 [place], [place].

## 2013-01-16 DIAGNOSIS — I4901 Ventricular fibrillation: Secondary | ICD-10-CM | POA: Insufficient documentation

## 2013-01-16 NOTE — Progress Notes (Signed)
PCP:  Sanda Linger, MD Primary CHF:  Previously Dr Glori Luis at [facility] center  The patient presents today for electrophysiology followup.  Since last being seen in our clinic, the patient has had a significant decline.  She reports progressive SOB, weight gain, and lower extremity edema.  She is unaware of any changes to her medicine, diet, or lifestyle.  She has previously followed closely with Dr Glori Luis at [facility] for CHF.  Unfortunately, Dr Glori Luis is no longer at [facility] and she has not seen anyone there in quite some time.  Today, she denies symptoms of palpitations, chest pain, dizziness, presyncope, syncope, or ICD shock therapy.  The patient feels that she is tolerating medications without difficulties and is otherwise without complaint today.   Past Medical History  Diagnosis Date  . Cardiac arrest - ventricular fibrillation 12/10    with successful resucitation, S/p ICD  . ICD (implantable cardiac defibrillator) in place     she has received appropriate therapy for VF  . Atrial fibrillation or flutter   . Osteopenia   . HTN (hypertension)     moderate  . Nonischemic cardiomyopathy     followed by Dr Glori Luis at [facility]  . CHF (congestive heart failure)   . Anemia   . S/P colonoscopy   . Plasma cell disorder 03/20/2012  . Plasma cell disorder 03/20/2012   Past Surgical History  Procedure Laterality Date  . Abdominal hysterectomy    . Cardiac catheterization    . Cardiac defibrillator placement      by JA for secondary prevention of sudden death    Current Outpatient Prescriptions  Medication Sig Dispense Refill  . amiodarone (PACERONE) 200 MG tablet Take 1 tablet (200 mg total) by mouth daily.  30 tablet  6  . carvedilol (COREG) 25 MG tablet Take 1 tablet (25 mg total) by mouth 2 (two) times daily with a meal.  180 tablet  1  . Cholecalciferol (VITAMIN D3) 1000 UNITS CAPS Take 1 tablet by mouth daily.        . furosemide (LASIX) 40 MG tablet Take 2 tabs in the  morning and 1 tab in the evening for a total of 3 tabs a day  270 tablet  2  . losartan (COZAAR) 25 MG tablet Take 1 tablet by mouth daily.      [name] oxyCODONE-acetaminophen (PERCOCET/ROXICET) 5-325 MG per tablet Take 1 tablet by mouth every 4 (four) hours as needed for pain.  10 tablet  0  . potassium chloride SA (K-DUR,KLOR-CON) 20 MEQ tablet Take 20 mEq by mouth 2 (two) times daily.      [name] warfarin (COUMADIN) 2.5 MG tablet Take 1 tablet (2.5 mg total) by mouth as directed.  45 tablet  3   No current facility-administered medications for this visit.    No Known Allergies  History   Social History  . Marital Status: Single    Spouse Name: N/A    Number of Children: 3  . Years of Education: N/A   Occupational History  . Not on file.   Social History Main Topics  . Smoking status: Former Games developer  . Smokeless tobacco: Never Used     Comment: remote history of tobacco abuse  . Alcohol Use: No  . Drug Use: No  . Sexually Active: Not Currently    Birth Control/ Protection: Surgical   Other Topics Concern  . Not on file   Social History Narrative   Lives with spouse who was  recently diagnosed with esophageal ca and is receiving chemotherapy    Family History  Problem Relation Age of Onset  . Stroke Brother   . Stroke Mother     ROS-  All systems are reviewed and are negative except as outlined in the HPI above  Physical Exam: Filed Vitals:   01/15/13 1216  BP: 104/78  Pulse: 74  Height: 5' (1.524 m)  Weight: 175 lb 1.9 oz (79.434 kg)    GEN- The patient is well appearing, alert and oriented x 3 today.   Head- normocephalic, atraumatic Eyes-  Sclera clear, conjunctiva pink Ears- hearing intact Oropharynx- clear Neck- supple, + elevated JVP Lymph- no cervical lymphadenopathy Lungs- bibasilar rales, normal work of breathing Heart- Regular rate and rhythm, no murmurs, rubs or gallops, PMI not laterally displaced GI- soft, NT, ND, + BS Extremities- no clubbing,  cyanosis, +2 edema (much worse than baseline) MS- no significant deformity or atrophy Skin- no rash or lesion Psych- euthymic mood, full affect Neuro- strength and sensation are intact ICD pocket is well healed  EKG from 01/14/12 is reviewed Echo 3/12- EF 30-35% [facility]) Dr Pura Spice notes are reviewed today  Assessment and Plan:  1. Acute on chronic decompensated systolic dysfunction The patient presents with acute volume overload and symptoms of CHF.  She has gained 6 lbs since her last visit to see me.  Her coreview has been elevated for the last 11 days. She has not been seen at [facility] in the advanced CHF clinic since Dr Glori Luis left.  She is very interested in following with Dr Gala Romney in our advanced heart failure clinic at this time.  I will refer her to see Dr Gala Romney. In the interim, I will increase her lasix to 80mg  BID.  Check BMET today. Her last echo was at [facility].  We will order a new echo at this time. 2 gram sodium restriction.  2. Prior VF Normal ICD function See Pace Art report No changes today Continue amiodarone  3. afib No afib since last device interrogation Continue coumadin  She will be referred to Dr Cephus Richer checks every 3 months I will see again in the device clinic in 1 year

## 2013-01-27 ENCOUNTER — Encounter (HOSPITAL_COMMUNITY): Payer: Self-pay

## 2013-01-27 ENCOUNTER — Ambulatory Visit (HOSPITAL_COMMUNITY)
Admission: RE | Admit: 2013-01-27 | Discharge: 2013-01-27 | Disposition: A | Discharge status: Home / Self Care | Payer: Medicare Other | Source: Ambulatory Visit | Attending: Internal Medicine | Admitting: Internal Medicine

## 2013-01-27 ENCOUNTER — Ambulatory Visit: Payer: Medicare Other | Admitting: Internal Medicine

## 2013-01-27 VITALS — BP 110/70 | HR 62 | Wt 170.4 lb

## 2013-01-27 DIAGNOSIS — I5022 Chronic systolic (congestive) heart failure: Secondary | ICD-10-CM

## 2013-01-27 DIAGNOSIS — I5023 Acute on chronic systolic (congestive) heart failure: Secondary | ICD-10-CM | POA: Diagnosis not present

## 2013-01-27 DIAGNOSIS — I428 Other cardiomyopathies: Secondary | ICD-10-CM | POA: Diagnosis not present

## 2013-01-27 MED ORDER — FUROSEMIDE 40 MG PO TABS: 80.0000 mg | ORAL_TABLET | Freq: Two times a day (BID) | ORAL | Status: DC

## 2013-01-27 NOTE — Patient Instructions (Addendum)
Increase Furosemide (Lasix) to 80 mg (2 tabs) Twice daily   Labs next week  Your physician recommends that you schedule a follow-up appointment in: 2 weeks  Do the following things EVERYDAY: 1) Weigh yourself in the morning before breakfast. Write it down and keep it in a log. 2) Take your medicines as prescribed 3) Eat low salt foods-Limit salt (sodium) to 2000 mg per day.  4) Stay as active as you can everyday 5) Limit all fluids for the day to less than 2 liters

## 2013-01-29 ENCOUNTER — Other Ambulatory Visit: Payer: Self-pay | Admitting: *Deleted

## 2013-01-29 ENCOUNTER — Telehealth (HOSPITAL_COMMUNITY): Payer: Self-pay | Admitting: *Deleted

## 2013-01-29 MED ORDER — WARFARIN SODIUM 2.5 MG PO TABS: 2.5000 mg | ORAL_TABLET | ORAL | Status: DC

## 2013-01-29 NOTE — Telephone Encounter (Signed)
Per Dr Gala Romney pt needs an echo and CPX before next appt, both test sch for 6/3 pt is aware

## 2013-02-01 DIAGNOSIS — I5022 Chronic systolic (congestive) heart failure: Secondary | ICD-10-CM | POA: Insufficient documentation

## 2013-02-01 NOTE — Progress Notes (Signed)
Patient ID: Brianna Hanson, female   DOB: 03/05/1935, 78 y.o.   MRN: [id number] EP: Brianna Hanson PCP: Brianna Hanson  Coumadin- [facility]  HPI:  Brianna Hanson is a 78 year old female with a PMH of HF due to severe NICM (EF 15%) diagnosed in 1999, cardiac arrest 2010 -V fib S/P ICD,  PAF (maintaing SR on amio) - on chronic coumadin and plasma cell disorder (Likely IgA MGUS).   She was followed by Brianna Hanson at [facility] and was previously on transplant list she was last seen June 2013. She has not followed for HF since that time because Brianna Hanson is no longer at [facility].  Prior to his departure she was feeling good and only being seen in the HF Clinic 1x/year. She recently followed with Brianna. Johney Hanson for f/u on her ICD and was referred here for HF management.   Over the last 6 months she said her HF has been getting worse. She has noticed increased edema, dyspnea with steps, hills and walking. Says she gets SOB with walking 100 feet.  + PND + Orthopnea -Sleeps on 2 pillows. Weighs herself only 3x/week. Weight typically 165 pounds. Does not using sliding scale lasix. She says if weight up she drinks more to flush her kidneys.   Saw Brianna. Johney Hanson 2 weeks ago and lasix increased for 5 days and she felt better. Previously on spiro but stopped by Brianna. Glori Hanson due to hyperkalemia. Has never been on digoxin.   She does not smoke or drink alcohol.  Retired from [employer].    Review of Systems:     Cardiac Review of Systems: {Y] = yes [ ]  = no  Chest Pain [    ]  Resting SOB [   ] Exertional SOB  [ Y ]  Orthopnea [ Y ]   Pedal Edema [   ]    Palpitations [  ] Syncope  [  ]   Presyncope [   ]  General Review of Systems: [Y] = yes [  ]=no Constitional: recent weight change [ Y ]; anorexia [  ]; fatigue [ Y ]; nausea [  ]; night sweats [  ]; fever [  ]; or chills [  ];                                                                                                                                          Dental: poor  dentition[  ]; Last Dentist visit:   Eye : blurred vision [  ]; diplopia [   ]; vision changes [  ];  Amaurosis fugax[  ]; Resp: cough [  ];  wheezing[  ];  hemoptysis[  ]; shortness of breath[Y  ]; paroxysmal nocturnal dyspnea[  Y]; dyspnea on exertion[ Y ]; or orthopnea[ Y ];  GI:  gallstones[  ], vomiting[  ];  dysphagia[  ];  melena[  ];  hematochezia [  ]; heartburn[  ];   Hx of  Colonoscopy[  ]; GU: kidney stones [  ]; hematuria[  ];   dysuria [  ];  nocturia[  ];  history of     obstruction [  ];                 Skin: rash, swelling[  ];, hair loss[  ];  peripheral edema[  ];  or itching[  ]; Musculosketetal: myalgias[  ];  joint swelling[  ];  joint erythema[  ];  joint pain[  ];  back pain[  ];  Heme/Lymph: bruising[  ];  bleeding[  ];  anemia[  ];  Neuro: TIA[  ];  headaches[  ];  stroke[  ];  vertigo[  ];  seizures[  ];   paresthesias[  ];  difficulty walking[  ];  Psych:depression[  ]; anxiety[  ];  Endocrine: diabetes[  ];  thyroid dysfunction[  ];  Immunizations: Flu [  ]; Pneumococcal[  ];  Other:    Past Medical History  Diagnosis Date  . Cardiac arrest - ventricular fibrillation 12/10    with successful resucitation, S/p ICD  . ICD (implantable cardiac defibrillator) in place     she has received appropriate therapy for VF  . Atrial fibrillation or flutter   . Osteopenia   . HTN (hypertension)     moderate  . Nonischemic cardiomyopathy     followed by Brianna Hanson at [facility]  . CHF (congestive heart failure)   . Anemia   . S/P colonoscopy   . Plasma cell disorder 03/20/2012  . Plasma cell disorder 03/20/2012    Current Outpatient Prescriptions  Medication Sig Dispense Refill  . amiodarone (PACERONE) 200 MG tablet Take 1 tablet (200 mg total) by mouth daily.  30 tablet  6  . carvedilol (COREG) 25 MG tablet Take 1 tablet (25 mg total) by mouth 2 (two) times daily with a meal.  180 tablet  1  . Cholecalciferol (VITAMIN D3) 1000 UNITS CAPS Take 1 tablet by mouth daily.         . furosemide (LASIX) 40 MG tablet Take 2 tablets (80 mg total) by mouth 2 (two) times daily.  120 tablet  3  . losartan (COZAAR) 25 MG tablet Take 1 tablet by mouth daily.      [name] oxyCODONE-acetaminophen (PERCOCET/ROXICET) 5-325 MG per tablet Take 1 tablet by mouth every 4 (four) hours as needed for pain.  10 tablet  0  . potassium chloride SA (K-DUR,KLOR-CON) 20 MEQ tablet Take 20 mEq by mouth 2 (two) times daily.      [name] warfarin (COUMADIN) 2.5 MG tablet Take 1 tablet (2.5 mg total) by mouth as directed.  45 tablet  3   No current facility-administered medications for this encounter.     No Known Allergies  History   Social History  . Marital Status: Single    Spouse Name: N/A    Number of Children: 3  . Years of Education: N/A   Occupational History  . Not on file.   Social History Main Topics  . Smoking status: Former Games developer  . Smokeless tobacco: Never Used     Comment: remote history of tobacco abuse  . Alcohol Use: No  . Drug Use: No  . Sexually Active: Not Currently    Birth Control/ Protection: Surgical   Other Topics Concern  . Not on file   Social History Narrative  Lives with spouse who was recently diagnosed with esophageal ca and is receiving chemotherapy    Family History  Problem Relation Age of Onset  . Stroke Brother   . Stroke Mother     PHYSICAL EXAM: Filed Vitals:   01/27/13 1133  BP: 110/70  Pulse: 62   General:  Well appearing. No respiratory difficulty HEENT: normal Neck: supple. JVP 8-9. Carotids 2+ bilat; no bruits. No lymphadenopathy or thryomegaly appreciated. Cor: PMI nondisplaced. Regular rate & rhythm. No rubs, gallops or murmurs. Lungs: clear Abdomen: soft, nontender, nondistended. No hepatosplenomegaly. No bruits or masses. Good bowel sounds. Extremities: no cyanosis, clubbing, rash, 1+ edema Neuro: alert & oriented x 3, cranial nerves grossly intact. moves all 4 extremities w/o difficulty. Affect pleasant.   No results  found for this or any previous visit (from the past 24 hour(s)). No results found.   ASSESSMENT & PLAN:

## 2013-02-01 NOTE — Assessment & Plan Note (Addendum)
By her history she seems to have progressive NYHA IIIb symptoms but her this was hard to elicit clearly. Her volume status is slightly elevated and she did well with recent increase in her diuretics. We will increase lasix to 80 bid. Check labs next week. Otherwise she is on a good medical regimen. Given her long h/o CHF, I was a bit surprised to her unfamiliarity with sliding scale lasix and reluctance to weighing herself daily. I spent a long time educating her on the importance of these things and gave her pre-dated forms to place in her bathroom to weight herself. We went over a sliding scale diuretic regimen in detail. Given her age she is not a transplant candidate but we could consider VAD in the right context. Will start with echo and CPX testing to more adequately characterize the severity of her HF.

## 2013-02-02 ENCOUNTER — Ambulatory Visit (HOSPITAL_COMMUNITY)
Admission: RE | Admit: 2013-02-02 | Discharge: 2013-02-02 | Disposition: A | Discharge status: Home / Self Care | Payer: Medicare Other | Source: Ambulatory Visit | Attending: Internal Medicine | Admitting: Internal Medicine

## 2013-02-02 ENCOUNTER — Ambulatory Visit (HOSPITAL_COMMUNITY): Payer: Medicare Other

## 2013-02-02 DIAGNOSIS — I059 Rheumatic mitral valve disease, unspecified: Secondary | ICD-10-CM | POA: Diagnosis not present

## 2013-02-02 DIAGNOSIS — R0609 Other forms of dyspnea: Secondary | ICD-10-CM

## 2013-02-02 DIAGNOSIS — I10 Essential (primary) hypertension: Secondary | ICD-10-CM | POA: Insufficient documentation

## 2013-02-02 DIAGNOSIS — I509 Heart failure, unspecified: Secondary | ICD-10-CM | POA: Diagnosis not present

## 2013-02-02 DIAGNOSIS — I079 Rheumatic tricuspid valve disease, unspecified: Secondary | ICD-10-CM | POA: Diagnosis not present

## 2013-02-02 DIAGNOSIS — I5022 Chronic systolic (congestive) heart failure: Secondary | ICD-10-CM

## 2013-02-02 DIAGNOSIS — R0989 Other specified symptoms and signs involving the circulatory and respiratory systems: Secondary | ICD-10-CM

## 2013-02-02 NOTE — Progress Notes (Signed)
  Echocardiogram 2D Echocardiogram has been performed.  Brianna Hanson 02/02/2013, 4:24 PM

## 2013-02-03 ENCOUNTER — Ambulatory Visit (INDEPENDENT_AMBULATORY_CARE_PROVIDER_SITE_OTHER): Payer: Medicare Other | Admitting: Internal Medicine

## 2013-02-03 ENCOUNTER — Ambulatory Visit (INDEPENDENT_AMBULATORY_CARE_PROVIDER_SITE_OTHER)
Admission: RE | Admit: 2013-02-03 | Discharge: 2013-02-03 | Disposition: A | Discharge status: Home / Self Care | Payer: Medicare Other | Source: Ambulatory Visit | Attending: Internal Medicine | Admitting: Internal Medicine

## 2013-02-03 ENCOUNTER — Encounter: Payer: Self-pay | Admitting: Internal Medicine

## 2013-02-03 ENCOUNTER — Other Ambulatory Visit (INDEPENDENT_AMBULATORY_CARE_PROVIDER_SITE_OTHER): Payer: Medicare Other

## 2013-02-03 VITALS — BP 122/68 | HR 62 | Temp 97.8°F | Resp 16 | Wt 170.0 lb

## 2013-02-03 DIAGNOSIS — M19019 Primary osteoarthritis, unspecified shoulder: Secondary | ICD-10-CM | POA: Diagnosis not present

## 2013-02-03 DIAGNOSIS — M25519 Pain in unspecified shoulder: Secondary | ICD-10-CM | POA: Diagnosis not present

## 2013-02-03 DIAGNOSIS — I5022 Chronic systolic (congestive) heart failure: Secondary | ICD-10-CM | POA: Diagnosis not present

## 2013-02-03 DIAGNOSIS — M25512 Pain in left shoulder: Secondary | ICD-10-CM

## 2013-02-03 LAB — BASIC METABOLIC PANEL
BUN: 18 mg/dL (ref 6–23)
CO2: 23 mEq/L (ref 19–32)
Calcium: 8.5 mg/dL (ref 8.4–10.5)
Chloride: 112 mEq/L (ref 96–112)
Creatinine, Ser: 1.4 mg/dL — ABNORMAL HIGH (ref 0.4–1.2)
GFR: 49.01 mL/min — ABNORMAL LOW (ref >60.00)
Glucose, Bld: 90 mg/dL (ref 70–99)
Potassium: 3.8 mEq/L (ref 3.5–5.1)
Sodium: 144 mEq/L (ref 135–145)

## 2013-02-03 NOTE — Assessment & Plan Note (Signed)
She does not want anything for pain I have asked her to see ortho for further evaluation

## 2013-02-03 NOTE — Progress Notes (Signed)
  Subjective:    Patient ID: Brianna Hanson, female    DOB: 02-23-1935, 78 y.o.   MRN: [id number]  Shoulder Pain  The pain is present in the left shoulder. This is a chronic problem. The current episode started more than 1 month ago. There has been no history of extremity trauma. The problem occurs intermittently. The problem has been gradually worsening. The quality of the pain is described as aching. The pain is at a severity of 2/10. The pain is mild. Associated symptoms include a limited range of motion. Pertinent negatives include no fever, inability to bear weight, itching, joint locking, joint swelling, numbness, stiffness or tingling. The symptoms are aggravated by activity. She has tried acetaminophen for the symptoms. The treatment provided moderate relief.      Review of Systems  Constitutional: Negative for fever.  Musculoskeletal: Negative for stiffness.  Skin: Negative for itching.  Neurological: Negative for tingling and numbness.  All other systems reviewed and are negative.       Objective:   Physical Exam  Musculoskeletal:       Left shoulder: She exhibits decreased range of motion. She exhibits no tenderness, no bony tenderness, no swelling, no effusion, no crepitus, no deformity, no laceration, no pain, no spasm, normal pulse and normal strength.      Lab Results  Component Value Date   WBC 5.9 01/13/2013   HGB 13.7 01/13/2013   HCT 40.3 01/13/2013   PLT 196.0 01/13/2013   GLUCOSE 75 01/15/2013   CHOL  Value: 130        ATP III CLASSIFICATION:  <200     mg/dL   Desirable  409-811  mg/dL   Borderline High  >=914    mg/dL   High        03/10/2955   TRIG 90 05/05/2010   HDL 36* 05/05/2010   LDLCALC  Value: 76        Total Cholesterol/HDL:CHD Risk Coronary Heart Disease Risk Table                     Men   Women  1/2 Average Risk   3.4   3.3  Average Risk       5.0   4.4  2 X Average Risk   9.6   7.1  3 X Average Risk  23.4   11.0        Use the calculated Patient Ratio above  and the CHD Risk Table to determine the patient's CHD Risk.        ATP III CLASSIFICATION (LDL):  <100     mg/dL   Optimal  213-086  mg/dL   Near or Above                    Optimal  130-159  mg/dL   Borderline  578-469  mg/dL   High  >629     mg/dL   Very High 01/02/8412   ALT 27 01/13/2013   AST 25 01/13/2013   NA 143 01/15/2013   K 3.8 01/15/2013   CL 110 01/15/2013   CREATININE 1.3* 01/15/2013   BUN 15 01/15/2013   CO2 28 01/15/2013   TSH 2.99 01/13/2013   INR 2.5 01/01/2013   HGBA1C 6.0 01/13/2013      Assessment & Plan:

## 2013-02-03 NOTE — Patient Instructions (Signed)
Shoulder Pain  The shoulder is the joint that connects your arms to your body. The bones that form the shoulder joint include the upper arm bone (humerus), the shoulder blade (scapula), and the collarbone (clavicle). The top of the humerus is shaped like a ball and fits into a rather flat socket on the scapula (glenoid cavity). A combination of muscles and strong, fibrous tissues that connect muscles to bones (tendons) support your shoulder joint and hold the ball in the socket. Small, fluid-filled sacs (bursae) are located in different areas of the joint. They act as cushions between the bones and the overlying soft tissues and help reduce friction between the gliding tendons and the bone as you move your arm. Your shoulder joint allows a wide range of motion in your arm. This range of motion allows you to do things like scratch your back or throw a ball. However, this range of motion also makes your shoulder more prone to pain from overuse and injury.  Causes of shoulder pain can originate from both injury and overuse and usually can be grouped in the following four categories:   Redness, swelling, and pain (inflammation) of the tendon (tendinitis) or the bursae (bursitis).   Instability, such as a dislocation of the joint.   Inflammation of the joint (arthritis).   Broken bone (fracture).  HOME CARE INSTRUCTIONS    Apply ice to the sore area.   Put ice in a plastic bag.   Place a towel between your skin and the bag.   Leave the ice on for 15-20 minutes, 3-4 times per day for the first 2 days.   Stop using cold packs if they do not help with the pain.   If you have a shoulder sling or immobilizer, wear it as long as your caregiver instructs. Only remove it to shower or bathe. Move your arm as little as possible, but keep your hand moving to prevent swelling.   Squeeze a soft ball or foam pad as much as possible to help prevent swelling.   Only take over-the-counter or prescription medicines for pain,  discomfort, or fever as directed by your caregiver.  SEEK MEDICAL CARE IF:    Your shoulder pain increases, or new pain develops in your arm, hand, or fingers.   Your hand or fingers become cold and numb.   Your pain is not relieved with medicines.  SEEK IMMEDIATE MEDICAL CARE IF:    Your arm, hand, or fingers are numb or tingling.   Your arm, hand, or fingers are significantly swollen or turn white or blue.  MAKE SURE YOU:    Understand these instructions.   Will watch your condition.   Will get help right away if you are not doing well or get worse.  Document Released: 05/29/2005 Document Revised: 05/13/2012 Document Reviewed: 08/03/2011  ExitCare Patient Information 2014 [employer].

## 2013-02-03 NOTE — Addendum Note (Signed)
Encounter addended by: Noralee Space, RN on: 02/03/2013  9:49 AM<BR>     Documentation filed: Orders

## 2013-02-08 DIAGNOSIS — M25519 Pain in unspecified shoulder: Secondary | ICD-10-CM | POA: Diagnosis not present

## 2013-02-09 ENCOUNTER — Encounter (HOSPITAL_COMMUNITY): Payer: Self-pay

## 2013-02-09 ENCOUNTER — Other Ambulatory Visit: Payer: Self-pay | Admitting: Oncology

## 2013-02-09 ENCOUNTER — Telehealth: Payer: Self-pay | Admitting: Oncology

## 2013-02-09 ENCOUNTER — Ambulatory Visit (HOSPITAL_COMMUNITY)
Admission: RE | Admit: 2013-02-09 | Discharge: 2013-02-09 | Disposition: A | Discharge status: Home / Self Care | Payer: Medicare Other | Source: Ambulatory Visit | Attending: Internal Medicine | Admitting: Internal Medicine

## 2013-02-09 VITALS — BP 116/80 | HR 62 | Ht 60.0 in | Wt 170.1 lb

## 2013-02-09 DIAGNOSIS — D472 Monoclonal gammopathy: Secondary | ICD-10-CM

## 2013-02-09 DIAGNOSIS — I5022 Chronic systolic (congestive) heart failure: Secondary | ICD-10-CM | POA: Insufficient documentation

## 2013-02-09 MED ORDER — LOSARTAN POTASSIUM 50 MG PO TABS: 50.0000 mg | ORAL_TABLET | Freq: Every day | ORAL | Status: DC

## 2013-02-09 NOTE — Telephone Encounter (Signed)
s.w.  pt and advised on 6.11.14 appt...pt ok and awre

## 2013-02-09 NOTE — Patient Instructions (Addendum)
Increase Losartan to 50 mg daily  Take extra Lasix (Furosemide) for weight 163-165  Labs next week at [facility]

## 2013-02-09 NOTE — Progress Notes (Signed)
Patient ID: Brianna Hanson, female   DOB: Jul 04, 1935, 78 y.o.   MRN: [id number] EP: Dr Ladona Ridgel PCP: Dr Yetta Barre  Coumadin- [facility]  HPI:  Brianna Hanson is a 78 year old female with a PMH of HF due to severe NICM (EF 15%) diagnosed in 1999, cardiac arrest 2010 -V fib S/P ICD,  PAF (maintaing SR on amio) - on chronic coumadin and plasma cell disorder (Likely IgA MGUS) - followed by DR. Shadad.   She was followed by Dr Glori Luis at [facility] and was previously on transplant list she was last seen June 2013. She has not followed for HF since that time because Dr Glori Luis is no longer at [facility].  Prior to his departure she was feeling good and only being seen in the HF Clinic 1x/year. Previously on spiro but stopped by Dr. Glori Luis due to hyperkalemia. Has never been on digoxin.   We saw her in the HF Clinic 2 weeks ago for her first visit. Last visit lasix increased to 80 mg bid. And CPX ordered. Weight stable. But breathing better. No edema. Drinks a lot of water > 2L.  SOB with mild activity. Denies PND. +orthopnea  Sleeps on 2 pillows. Lives with her boyfriend Brianna Hanson)  > 31 years.   ECHO 02/02/13 EF 15% RV mild to moderately HK.   CPX 02/02/13  Peak VO2: 6.4 ml/kg/min  predicted peak VO2: 40.1% VE/VCO2 slope: 58.3 OUES: 0.48 Peak RER: 1.13    Past Medical History  Diagnosis Date  . Cardiac arrest - ventricular fibrillation 12/10    with successful resucitation, S/p ICD  . ICD (implantable cardiac defibrillator) in place     she has received appropriate therapy for VF  . Atrial fibrillation or flutter   . Osteopenia   . HTN (hypertension)     moderate  . Nonischemic cardiomyopathy     followed by Dr Glori Luis at [facility]  . CHF (congestive heart failure)   . Anemia   . S/P colonoscopy   . Plasma cell disorder 03/20/2012  . Plasma cell disorder 03/20/2012    Current Outpatient Prescriptions  Medication Sig Dispense Refill  . amiodarone (PACERONE) 200 MG tablet Take 1 tablet (200 mg total)  by mouth daily.  30 tablet  6  . carvedilol (COREG) 25 MG tablet Take 1 tablet (25 mg total) by mouth 2 (two) times daily with a meal.  180 tablet  1  . Cholecalciferol (VITAMIN D3) 1000 UNITS CAPS Take 1 tablet by mouth daily.        . furosemide (LASIX) 40 MG tablet Take 2 tablets (80 mg total) by mouth 2 (two) times daily.  120 tablet  3  . losartan (COZAAR) 25 MG tablet Take 1 tablet by mouth daily.      . potassium chloride SA (K-DUR,KLOR-CON) 20 MEQ tablet Take 20 mEq by mouth 2 (two) times daily.      [name] warfarin (COUMADIN) 2.5 MG tablet Take 1 tablet (2.5 mg total) by mouth as directed.  45 tablet  3   No current facility-administered medications for this encounter.     No Known Allergies  History   Social History  . Marital Status: Single    Spouse Name: N/A    Number of Children: 3  . Years of Education: N/A   Occupational History  . Not on file.   Social History Main Topics  . Smoking status: Former Games developer  . Smokeless tobacco: Never Used     Comment: remote  history of tobacco abuse  . Alcohol Use: No  . Drug Use: No  . Sexually Active: Not Currently    Birth Control/ Protection: Surgical   Other Topics Concern  . Not on file   Social History Narrative   Lives with spouse who was recently diagnosed with esophageal ca and is receiving chemotherapy    Family History  Problem Relation Age of Onset  . Stroke Brother   . Stroke Mother     PHYSICAL EXAM: Filed Vitals:   02/09/13 1047  BP: 116/80  Pulse: 62   General:  Well appearing. No respiratory difficulty HEENT: normal Neck: supple. JVP 7. Carotids 2+ bilat; no bruits. No lymphadenopathy or thryomegaly appreciated. Cor: PMI nondisplaced. Regular rate & rhythm. No rubs, gallops or murmurs. Lungs: clear Abdomen: soft, nontender, nondistended. No hepatosplenomegaly. No bruits or masses. Good bowel sounds. Extremities: no cyanosis, clubbing, rash, tr edema Neuro: alert & oriented x 3, cranial nerves  grossly intact. moves all 4 extremities w/o difficulty. Affect pleasant.   No results found for this or any previous visit (from the past 24 hour(s)). No results found.   ASSESSMENT & PLAN:

## 2013-02-09 NOTE — Assessment & Plan Note (Addendum)
She continues to struggle with NYHA IIIB-IV HF symptoms. Echo with EF 15%. CPX test reviewed in detail with her and her husband. Shows marked HF limitation with high risk features. We had a long talk (>45 minutes) regarding the possibility of advanced therapies. She is not a Tx candidate due to her age and milrinone is not durable option. I suggested she strongly consider LVAD therapy and I went over the device carefully and gave them informational materials. They would like to think about it some more and also talk with an LVAD patient. We will schedule appt with Dr. Donata Clay next week and also arrange a meeting with an LVAD patient. If they decide to proceed will need RHC and LHC and also a closer look at her RV. I have also contacted Dr. Clelia Croft to give [place] an updated status on her MGUS and provide a preliminary prognosis from that standpoint. We will see her back soon to f/u on these discussions.  From a medical management standpoint, she is improved with increased dose of lasix though weight unchanged. Reminded her to limit fluid intake. Reinforced need for daily weights and reviewed use of sliding scale diuretics.Will increase Losartan to 50 daily. Check labs next week.   Total time spent 70 minutes including discussion time as above.

## 2013-02-10 ENCOUNTER — Ambulatory Visit (HOSPITAL_BASED_OUTPATIENT_CLINIC_OR_DEPARTMENT_OTHER): Payer: Medicare Other | Admitting: Oncology

## 2013-02-10 ENCOUNTER — Other Ambulatory Visit (HOSPITAL_BASED_OUTPATIENT_CLINIC_OR_DEPARTMENT_OTHER): Payer: Medicare Other | Admitting: Lab

## 2013-02-10 ENCOUNTER — Telehealth: Payer: Self-pay | Admitting: Oncology

## 2013-02-10 VITALS — BP 104/72 | HR 60 | Temp 96.7°F | Resp 18 | Ht 60.0 in | Wt 169.5 lb

## 2013-02-10 DIAGNOSIS — D7289 Other specified disorders of white blood cells: Secondary | ICD-10-CM

## 2013-02-10 DIAGNOSIS — D472 Monoclonal gammopathy: Secondary | ICD-10-CM

## 2013-02-10 DIAGNOSIS — I509 Heart failure, unspecified: Secondary | ICD-10-CM

## 2013-02-10 DIAGNOSIS — D729 Disorder of white blood cells, unspecified: Secondary | ICD-10-CM

## 2013-02-10 LAB — COMPREHENSIVE METABOLIC PANEL (CC13)
ALT: 21 U/L (ref 0–55)
AST: 22 U/L (ref 5–34)
Albumin: 3 g/dL — ABNORMAL LOW (ref 3.5–5.0)
Alkaline Phosphatase: 99 U/L (ref 40–150)
BUN: 17.5 mg/dL (ref 7.0–26.0)
CO2: 25 mEq/L (ref 22–29)
Calcium: 8.9 mg/dL (ref 8.4–10.4)
Chloride: 110 mEq/L — ABNORMAL HIGH (ref 98–107)
Creatinine: 1.4 mg/dL — ABNORMAL HIGH (ref 0.6–1.1)
Glucose: 97 mg/dl (ref 70–99)
Potassium: 3.7 mEq/L (ref 3.5–5.1)
Sodium: 143 mEq/L (ref 136–145)
Total Bilirubin: 0.67 mg/dL (ref 0.20–1.20)
Total Protein: 6.9 g/dL (ref 6.4–8.3)

## 2013-02-10 LAB — CBC WITH DIFFERENTIAL/PLATELET
BASO%: 1.5 % (ref 0.0–2.0)
Basophils Absolute: 0.1 10*3/uL (ref 0.0–0.1)
EOS%: 1.8 % (ref 0.0–7.0)
Eosinophils Absolute: 0.1 10*3/uL (ref 0.0–0.5)
HCT: 41.2 % (ref 34.8–46.6)
HGB: 13.8 g/dL (ref 11.6–15.9)
LYMPH%: 34.7 % (ref 14.0–49.7)
MCH: 31.1 pg (ref 25.1–34.0)
MCHC: 33.4 g/dL (ref 31.5–36.0)
MCV: 93.1 fL (ref 79.5–101.0)
MONO#: 0.8 10*3/uL (ref 0.1–0.9)
MONO%: 15.6 % — ABNORMAL HIGH (ref 0.0–14.0)
NEUT#: 2.2 10*3/uL (ref 1.5–6.5)
NEUT%: 46.4 % (ref 38.4–76.8)
Platelets: 167 10*3/uL (ref 145–400)
RBC: 4.43 10*6/uL (ref 3.70–5.45)
RDW: 16 % — ABNORMAL HIGH (ref 11.2–14.5)
WBC: 4.8 10*3/uL (ref 3.9–10.3)
lymph#: 1.7 10*3/uL (ref 0.9–3.3)

## 2013-02-10 NOTE — Progress Notes (Signed)
Hematology and Oncology Follow Up Visit  Brianna Hanson [id number] [birth year] 78 y.o. 02/10/2013 11:44 AM     Principle Diagnosis: 78 year old with plasma cell disorder. Likely IgA MGUS and less likely Myeloma.   Current therapy: observation and surveillance.   Interim History:  This is A 78 year old woman returns today for a follow up visit. She was seen last on 10/2012 for the evaluation of a plasma cell disorder.  She Continues to be asymptomatic form that at this point. She is not reporting any back pain.  She is not reporting any pathological fracture. Has not reported any change in her activity level or performance status. She is rather functional, although she does not drive, but able to perform most activities of daily living without any hindrance or decline. She had not had any peripheral neuropathy. Had not reported any immune dysregulation or recurrent sinopulmonary infection. She is not reporting any edema. Her breathing is a lot better at this time.  She is under consideration for possible LVAD   Medications: I have reviewed the patient's current medications. Current Outpatient Prescriptions  Medication Sig Dispense Refill  . amiodarone (PACERONE) 200 MG tablet Take 1 tablet (200 mg total) by mouth daily.  30 tablet  6  . carvedilol (COREG) 25 MG tablet Take 1 tablet (25 mg total) by mouth 2 (two) times daily with a meal.  180 tablet  1  . Cholecalciferol (VITAMIN D3) 1000 UNITS CAPS Take 1 tablet by mouth daily.        . furosemide (LASIX) 40 MG tablet Take 2 tablets (80 mg total) by mouth 2 (two) times daily.  120 tablet  3  . losartan (COZAAR) 50 MG tablet Take 1 tablet (50 mg total) by mouth daily.  30 tablet  3  . meloxicam (MOBIC) 15 MG tablet Take 15 mg by mouth daily.      . potassium chloride SA (K-DUR,KLOR-CON) 20 MEQ tablet Take 20 mEq by mouth 2 (two) times daily.      [name] warfarin (COUMADIN) 2.5 MG tablet Take 1 tablet (2.5 mg total) by mouth as directed.  45 tablet  3    No current facility-administered medications for this visit.    Allergies: No Known Allergies  Past Medical History, Surgical history, Social history, and Family History were reviewed and updated.  Review of Systems: Constitutional:  Negative for fever, chills, night sweats, anorexia, weight loss, pain. Cardiovascular: no chest pain or dyspnea on exertion Respiratory: negative Neurological: negative Dermatological: negative ENT: negative Skin: Negative. Gastrointestinal: negative Genito-Urinary: negative Hematological and Lymphatic: negative Breast: negative Musculoskeletal: negative Remaining ROS negative. Physical Exam: Blood pressure 104/72, pulse 60, temperature 96.7 F (35.9 C), temperature source Oral, resp. rate 18, height 5' (1.524 m), weight 169 lb 8 oz (76.885 kg). ECOG: 1 General appearance: alert Head: Normocephalic, without obvious abnormality, atraumatic Neck: no adenopathy, no carotid bruit, no JVD, supple, symmetrical, trachea midline and thyroid not enlarged, symmetric, no tenderness/mass/nodules Lymph nodes: Cervical, supraclavicular, and axillary nodes normal. Heart:regular rate and rhythm, S1, S2 normal, no murmur, click, rub or gallop Lung:chest clear, no wheezing, rales, normal symmetric air entry Abdomin: soft, non-tender, without masses or organomegaly EXT:no erythema, induration, or nodules   Lab Results: Lab Results  Component Value Date   WBC 4.8 02/10/2013   HGB 13.8 02/10/2013   HCT 41.2 02/10/2013   MCV 93.1 02/10/2013   PLT 167 02/10/2013     Chemistry      Component Value Date/Time  NA 143 02/10/2013 1053   NA 144 02/03/2013 1524   K 3.7 02/10/2013 1053   K 3.8 02/03/2013 1524   CL 110* 02/10/2013 1053   CL 112 02/03/2013 1524   CO2 25 02/10/2013 1053   CO2 23 02/03/2013 1524   BUN 17.5 02/10/2013 1053   BUN 18 02/03/2013 1524   CREATININE 1.4* 02/10/2013 1053   CREATININE 1.4* 02/03/2013 1524      Component Value Date/Time   CALCIUM 8.9  02/10/2013 1053   CALCIUM 8.5 02/03/2013 1524   ALKPHOS 99 02/10/2013 1053   ALKPHOS 86 01/13/2013 1639   AST 22 02/10/2013 1053   AST 25 01/13/2013 1639   ALT 21 02/10/2013 1053   ALT 27 01/13/2013 1639   BILITOT 0.67 02/10/2013 1053   BILITOT 0.7 01/13/2013 1639     Results for Mcavoy, SHAARON Hanson (MRN [id number]) as of 02/10/2013 11:31  Ref. Range 03/24/2012 10:42 10/21/2012 09:28  M-SPIKE, % No range found 0.65 0.92   Results for Hanson, Brianna (MRN [id number]) as of 02/10/2013 11:31  Ref. Range 03/24/2012 10:42 10/21/2012 09:28  IgG (Immunoglobin G), Serum Latest Range: [phone] mg/dL [phone]  IgA Latest Range: 69-380 mg/dL 308 (H) 657 (H)  IgM, Serum Latest Range: 52-322 mg/dL 85 73  Total Protein, serum electrophor Latest Range: 6.0-8.3 g/dL 7.0 7.0   Impression and Plan: A 78 year old woman with the following issues:  1. Monoclonal gammopathy, IgA subtype with an M-spike of 0.9 g/dL, IgA level of 846. The differential diagnosis include multiple myeloma (less likely given the lack of end organ damage) vs. monoclonal gammopathy of undetermined significance.  She has no end organ damage to suggest multiple myeloma but she does have a risk of developing that in the future.This risk is estimated about of 1-2% per year.Given that her IgA levels went down and her M spike moved very slightly, I think her risk of progression to myeloma is low.   2. CHF: she is under consideration to have an LVAD placed. I see no reason from hematology stand point not to do this.   She will follow up in 08/2013 for revaluation.        [facility], MD 6/11/201411:44 AM

## 2013-02-10 NOTE — Telephone Encounter (Signed)
Gave pt appt for lab and Md on December 2014

## 2013-02-12 ENCOUNTER — Ambulatory Visit (INDEPENDENT_AMBULATORY_CARE_PROVIDER_SITE_OTHER): Payer: Medicare Other | Admitting: *Deleted

## 2013-02-12 DIAGNOSIS — I4891 Unspecified atrial fibrillation: Secondary | ICD-10-CM | POA: Diagnosis not present

## 2013-02-12 DIAGNOSIS — I5023 Acute on chronic systolic (congestive) heart failure: Secondary | ICD-10-CM

## 2013-02-12 DIAGNOSIS — I4892 Unspecified atrial flutter: Secondary | ICD-10-CM | POA: Diagnosis not present

## 2013-02-12 DIAGNOSIS — Z7901 Long term (current) use of anticoagulants: Secondary | ICD-10-CM

## 2013-02-12 DIAGNOSIS — Z8679 Personal history of other diseases of the circulatory system: Secondary | ICD-10-CM

## 2013-02-12 LAB — SPEP & IFE WITH QIG
Albumin ELP: 48.8 % — ABNORMAL LOW (ref 55.8–66.1)
Alpha-1-Globulin: 4.3 % (ref 2.9–4.9)
Alpha-2-Globulin: 10.2 % (ref 7.1–11.8)
Beta 2: 17.3 % — ABNORMAL HIGH (ref 3.2–6.5)
Beta Globulin: 6.3 % (ref 4.7–7.2)
Gamma Globulin: 13.1 % (ref 11.1–18.8)
IgA: 696 mg/dL — ABNORMAL HIGH (ref 69–380)
IgG (Immunoglobin G), Serum: 1180 mg/dL (ref 690–1700)
IgM, Serum: 48 mg/dL — ABNORMAL LOW (ref 52–322)
M-Spike, %: 0.71 g/dL
Total Protein, Serum Electrophoresis: 6.6 g/dL (ref 6.0–8.3)

## 2013-02-12 LAB — POCT INR: INR: 2.1

## 2013-02-15 DIAGNOSIS — M25879 Other specified joint disorders, unspecified ankle and foot: Secondary | ICD-10-CM | POA: Diagnosis not present

## 2013-02-15 DIAGNOSIS — M6281 Muscle weakness (generalized): Secondary | ICD-10-CM | POA: Diagnosis not present

## 2013-02-16 ENCOUNTER — Other Ambulatory Visit (INDEPENDENT_AMBULATORY_CARE_PROVIDER_SITE_OTHER): Payer: Medicare Other

## 2013-02-16 DIAGNOSIS — I5022 Chronic systolic (congestive) heart failure: Secondary | ICD-10-CM

## 2013-02-17 ENCOUNTER — Encounter (HOSPITAL_COMMUNITY): Payer: Self-pay | Admitting: *Deleted

## 2013-02-17 ENCOUNTER — Ambulatory Visit (HOSPITAL_COMMUNITY)
Admission: RE | Admit: 2013-02-17 | Discharge: 2013-02-17 | Disposition: A | Discharge status: Home / Self Care | Payer: Medicare Other | Source: Ambulatory Visit | Attending: Internal Medicine | Admitting: Internal Medicine

## 2013-02-17 ENCOUNTER — Encounter (HOSPITAL_COMMUNITY): Payer: Self-pay

## 2013-02-17 VITALS — BP 100/70 | HR 62 | Wt 169.8 lb

## 2013-02-17 DIAGNOSIS — Z95810 Presence of automatic (implantable) cardiac defibrillator: Secondary | ICD-10-CM | POA: Insufficient documentation

## 2013-02-17 DIAGNOSIS — R0609 Other forms of dyspnea: Secondary | ICD-10-CM

## 2013-02-17 DIAGNOSIS — I5023 Acute on chronic systolic (congestive) heart failure: Secondary | ICD-10-CM

## 2013-02-17 DIAGNOSIS — N189 Chronic kidney disease, unspecified: Secondary | ICD-10-CM | POA: Insufficient documentation

## 2013-02-17 DIAGNOSIS — R0600 Dyspnea, unspecified: Secondary | ICD-10-CM

## 2013-02-17 DIAGNOSIS — I129 Hypertensive chronic kidney disease with stage 1 through stage 4 chronic kidney disease, or unspecified chronic kidney disease: Secondary | ICD-10-CM | POA: Diagnosis not present

## 2013-02-17 DIAGNOSIS — Z860100 Personal history of colon polyps, unspecified: Secondary | ICD-10-CM | POA: Insufficient documentation

## 2013-02-17 DIAGNOSIS — K8020 Calculus of gallbladder without cholecystitis without obstruction: Secondary | ICD-10-CM | POA: Diagnosis not present

## 2013-02-17 DIAGNOSIS — I428 Other cardiomyopathies: Secondary | ICD-10-CM | POA: Diagnosis not present

## 2013-02-17 DIAGNOSIS — I5022 Chronic systolic (congestive) heart failure: Secondary | ICD-10-CM | POA: Diagnosis not present

## 2013-02-17 DIAGNOSIS — Z87891 Personal history of nicotine dependence: Secondary | ICD-10-CM | POA: Insufficient documentation

## 2013-02-17 DIAGNOSIS — I4891 Unspecified atrial fibrillation: Secondary | ICD-10-CM | POA: Diagnosis not present

## 2013-02-17 DIAGNOSIS — Z8674 Personal history of sudden cardiac arrest: Secondary | ICD-10-CM | POA: Diagnosis not present

## 2013-02-17 DIAGNOSIS — Z79899 Other long term (current) drug therapy: Secondary | ICD-10-CM | POA: Insufficient documentation

## 2013-02-17 DIAGNOSIS — I509 Heart failure, unspecified: Secondary | ICD-10-CM | POA: Diagnosis not present

## 2013-02-17 LAB — CBC
HCT: 42.3 % (ref 36.0–46.0)
Hemoglobin: 14.1 g/dL (ref 12.0–15.0)
MCH: 31.1 pg (ref 26.0–34.0)
MCHC: 33.3 g/dL (ref 30.0–36.0)
MCV: 93.4 fL (ref 78.0–100.0)
Platelets: 194 10*3/uL (ref 150–400)
RBC: 4.53 MIL/uL (ref 3.87–5.11)
RDW: 16.4 % — ABNORMAL HIGH (ref 11.5–15.5)
WBC: 5.3 10*3/uL (ref 4.0–10.5)

## 2013-02-17 LAB — PROTIME-INR
INR: 2.19 — ABNORMAL HIGH (ref 0.00–1.49)
Prothrombin Time: 23.4 seconds — ABNORMAL HIGH (ref 11.6–15.2)

## 2013-02-17 LAB — BASIC METABOLIC PANEL
BUN: 18 mg/dL (ref 6–23)
BUN: 18 mg/dL (ref 6–23)
CO2: 23 mEq/L (ref 19–32)
CO2: 26 mEq/L (ref 19–32)
Calcium: 8.4 mg/dL (ref 8.4–10.5)
Calcium: 9.1 mg/dL (ref 8.4–10.5)
Chloride: 104 mEq/L (ref 96–112)
Chloride: 107 mEq/L (ref 96–112)
Creatinine, Ser: 1.2 mg/dL (ref 0.4–1.2)
Creatinine, Ser: 1.33 mg/dL — ABNORMAL HIGH (ref 0.50–1.10)
GFR calc Af Amer: 44 mL/min — ABNORMAL LOW (ref >90)
GFR calc non Af Amer: 38 mL/min — ABNORMAL LOW (ref >90)
GFR: 54.57 mL/min — ABNORMAL LOW (ref >60.00)
Glucose, Bld: 99 mg/dL (ref 70–99)
Glucose, Bld: 87 mg/dL (ref 70–99)
Potassium: 3.7 mEq/L (ref 3.5–5.1)
Potassium: 4 mEq/L (ref 3.5–5.1)
Sodium: 141 mEq/L (ref 135–145)
Sodium: 141 mEq/L (ref 135–145)

## 2013-02-17 NOTE — Patient Instructions (Addendum)
Will call you to schedule all the testing  Heart cath is 7/2

## 2013-02-17 NOTE — Progress Notes (Signed)
Patient ID: Brianna Hanson, female   DOB: 07/15/1935, 78 y.o.   MRN: [id number]      [address]       [place] [address]             [id number]        [name] [facility] Medical Record #[id number] Date of Birth: Sep 03, 1934  Referring: Etta Grandchild, MD Primary Care: Sanda Linger, MD  Chief Complaint:   No chief complaint on file.  class 4 CHF  History of Present Illness:     idiopathic CM for 5 yrs- now worse not responding to medical Rx. 2-D echo shows EF 15-20% without AI or TR. RV function is mildly depressed CPX study shows low peak oxygen consumption< 7.0 Right heart cath is pending Patient has AICD placed in 2011 which has not discharged in several years. She has history of atrial fibrillation on Coumadin and amiodarone followed by Dr. Johney Frame. She has had no major bleeding complications of that a nosebleed on Coumadin. She had a colonoscopy within the past 2 years with a polyp removal milligram normality is noted. She has had chronic renal insufficiency with creatinine ranging between 1.4 and 2.3 more recently around 1.5. Kidney ultrasound shows echogenic changes in the cortex no other abnormalities. CT abdomen shows only gallstones no history of AAA or adenopathy   She was recently evaluated by oncology for a plasma cell disorder which is basically subclinical and has not felt to represent a high-risk for myeloma.  No previous significant pulmonary problems, chest surgery or thoraqcic trauma.  No history of  stroke from her history of hypertension    Zubrod Score: At the time of surgery this patient's most appropriate activity status/level should be described as: []  Normal activity, no symptoms [x]  Symptoms, fully ambulatory []  Symptoms, in bed less than or equal to 50% of the time []  Symptoms, in bed greater than 50% of the time but less than 100% []  Bedridden []  Moribund  Past Medical History  Diagnosis Date  . Cardiac arrest - ventricular  fibrillation 12/10    with successful resucitation, S/p ICD  . ICD (implantable cardiac defibrillator) in place     she has received appropriate therapy for VF  . Atrial fibrillation or flutter   . Osteopenia   . HTN (hypertension)     moderate  . Nonischemic cardiomyopathy     followed by Dr Glori Luis at [facility]  . CHF (congestive heart failure)   . Anemia   . S/P colonoscopy   . Plasma cell disorder 03/20/2012  . Plasma cell disorder 03/20/2012    Past Surgical History  Procedure Laterality Date  . Abdominal hysterectomy    . Cardiac catheterization    . Cardiac defibrillator placement      by JA for secondary prevention of sudden death    History  Smoking status  . Former Smoker  Smokeless tobacco  . Never Used    Comment: remote history of tobacco abuse   History  Alcohol Use No    History   Social History  . Marital Status: Single    Spouse Name: N/A    Number of Children: 3  . Years of Education: N/A   Occupational History  . Not on file.   Social History Main Topics  . Smoking status: Former Games developer  . Smokeless tobacco: Never Used     Comment: remote history of tobacco abuse  . Alcohol Use: No  . Drug  Use: No  . Sexually Active: Not Currently    Birth Control/ Protection: Surgical   Other Topics Concern  . Not on file   Social History Narrative   Lives with spouse who was recently diagnosed with esophageal ca and is receiving chemotherapy    No Known Allergies  Current Outpatient Prescriptions  Medication Sig Dispense Refill  . amiodarone (PACERONE) 200 MG tablet Take 1 tablet (200 mg total) by mouth daily.  30 tablet  6  . carvedilol (COREG) 25 MG tablet Take 1 tablet (25 mg total) by mouth 2 (two) times daily with a meal.  180 tablet  1  . Cholecalciferol (VITAMIN D3) 1000 UNITS CAPS Take 1 tablet by mouth daily.        . furosemide (LASIX) 40 MG tablet Take 2 tablets (80 mg total) by mouth 2 (two) times daily.  120 tablet  3  . losartan  (COZAAR) 50 MG tablet Take 1 tablet (50 mg total) by mouth daily.  30 tablet  3  . potassium chloride SA (K-DUR,KLOR-CON) 20 MEQ tablet Take 20 mEq by mouth 2 (two) times daily.      [name] warfarin (COUMADIN) 2.5 MG tablet Take 1 tablet (2.5 mg total) by mouth as directed.  45 tablet  3   No current facility-administered medications for this encounter.     (Not in a hospital admission)  Family History  Problem Relation Age of Onset  . Stroke Brother   . Stroke Mother      Review of Systems:     Cardiac Review of Systems: Y or N  Chest Pain [  N   ]  Resting SOB [  Y.   ] Exertional SOB  [ Y.   ]  Orthopnea [Y.  ]   Pedal Edema [   Y.  ]    Palpitations [  Y.  ] Syncope  [  ]   Presyncope [   ]  General Review of Systems: [Y] = yes [  ]=no Constitional: recent weight change [  ]; anorexia [  ]; fatigue [  ]; nausea [  ]; night sweats [  ]; fever [  ]; or chills [  ]                                                               Dental: poor dentition[  ]; Last Dentist visit: edentulous    Eye : blurred vision [  ]; diplopia [   ]; vision changes [  ];  Amaurosis fugax[  ]; Resp: cough [  ];  wheezing[  ];  hemoptysis[  ]; shortness of breath[  ]; paroxysmal nocturnal dyspnea[  Y.  ]; dyspnea on exertion[ Y. ]; or orthopnea[ Y.  ];  GI:  gallstones[  ], vomiting[  ];  dysphagia[  ]; melena[  ];  hematochezia [  ]; heartburn[  ];   Hx of  Colonoscopy[  Y.  ]; GU: kidney stones [  ]; hematuria[  ];   dysuria [  ];  nocturia[  ];  history of     obstruction [  ]; urinary frequency [  ]             Skin: rash, swelling[  ];, hair loss[  ];  peripheral edema[  ];  or itching[  ]; Musculosketetal: myalgias[  ];  joint swelling[  ];  joint erythema[  ];  joint pain[  ];  back pain[  ];  Heme/Lymph: bruising[  ];  bleeding[  ];  anemia[  ];  Neuro: TIA[  ];  headaches[  ];  stroke[  ];  vertigo[  ];  seizures[  ];   paresthesias[  ];  difficulty walking[  ];  Psych:depression[  ]; anxiety[   ];  Endocrine: diabetes[  ];  thyroid dysfunction[  ];  Immunizations: Flu [  ]; Pneumococcal[  ];  Other:  Physical Exam: BP 100/70  Pulse 62  Wt 169 lb 12.8 oz (77.021 kg)  BMI 33.16 kg/m2  SpO2 98%  Gen. alert and oriented no acute distress PSA 1.8   HEENT normocephalic pupils equal full dental plates  Neck-no JVD mass or bruit  Thorax well-healed AICD breath sounds clear and equal no deformity or tenderness COR regular rhythm without murmur positive S4   abdomen soft obese nontender Extremities  mild pedal edema no clubbing cyanosis or tenderness Neuro right-hand dominant no focal motor deficit   Diagnostic Studies & Laboratory data:    2-D echocardiogram reviewed CT of abdomen reviewed   right heart cath, chest CT PFTs carotid ultrasound pending  Recent Radiology Findings:   No results found.    Recent Lab Findings: Lab Results  Component Value Date   WBC 4.8 02/10/2013   HGB 13.8 02/10/2013   HCT 41.2 02/10/2013   PLT 167 02/10/2013   GLUCOSE 97 02/10/2013   CHOL  Value: 130        ATP III CLASSIFICATION:  <200     mg/dL   Desirable  161-096  mg/dL   Borderline High  >=045    mg/dL   High        4/0/9811   TRIG 90 05/05/2010   HDL 36* 05/05/2010   LDLCALC  Value: 76        Total Cholesterol/HDL:CHD Risk Coronary Heart Disease Risk Table                     Men   Women  1/2 Average Risk   3.4   3.3  Average Risk       5.0   4.4  2 X Average Risk   9.6   7.1  3 X Average Risk  23.4   11.0        Use the calculated Patient Ratio above and the CHD Risk Table to determine the patient's CHD Risk.        ATP III CLASSIFICATION (LDL):  <100     mg/dL   Optimal  914-782  mg/dL   Near or Above                    Optimal  130-159  mg/dL   Borderline  956-213  mg/dL   High  >086     mg/dL   Very High 01/07/8468   ALT 21 02/10/2013   AST 22 02/10/2013   NA 143 02/10/2013   K 3.7 02/10/2013   CL 110* 02/10/2013   CREATININE 1.4* 02/10/2013   BUN 17.5 02/10/2013   CO2 25 02/10/2013   TSH 2.99  01/13/2013   INR 2.1 02/12/2013   HGBA1C 6.0 01/13/2013      Assessment / Plan:     nonischemic cardiomyopathy   progressive  Class 4heart failure  Appears  to be appropriate LVAD candidate with adequate RV function, no AI, no TR mild plasm cell disorder at low risk for myeloma renal function is been stable at 1.4 for a year  Her height is only 5 foot but BSA is 1.8 which should be adequate size for a HeartMate 2    following in heart failure clinic      @ME1 @ 02/17/2013 10:15 AM

## 2013-02-18 DIAGNOSIS — M25519 Pain in unspecified shoulder: Secondary | ICD-10-CM | POA: Diagnosis not present

## 2013-02-18 DIAGNOSIS — M6281 Muscle weakness (generalized): Secondary | ICD-10-CM | POA: Diagnosis not present

## 2013-03-01 ENCOUNTER — Inpatient Hospital Stay (HOSPITAL_COMMUNITY)
Admission: RE | Admit: 2013-03-01 | Discharge: 2013-03-01 | Disposition: A | Discharge status: Home / Self Care | Payer: Medicare Other | Source: Ambulatory Visit | Attending: Cardiothoracic Surgery | Admitting: Cardiothoracic Surgery

## 2013-03-01 ENCOUNTER — Ambulatory Visit (HOSPITAL_COMMUNITY)
Admission: RE | Admit: 2013-03-01 | Discharge: 2013-03-01 | Disposition: A | Discharge status: Home / Self Care | Payer: Medicare Other | Source: Ambulatory Visit | Attending: Cardiothoracic Surgery | Admitting: Cardiothoracic Surgery

## 2013-03-01 DIAGNOSIS — I5022 Chronic systolic (congestive) heart failure: Secondary | ICD-10-CM

## 2013-03-01 DIAGNOSIS — I10 Essential (primary) hypertension: Secondary | ICD-10-CM | POA: Insufficient documentation

## 2013-03-01 DIAGNOSIS — Z01810 Encounter for preprocedural cardiovascular examination: Secondary | ICD-10-CM | POA: Diagnosis not present

## 2013-03-01 DIAGNOSIS — R0609 Other forms of dyspnea: Secondary | ICD-10-CM | POA: Insufficient documentation

## 2013-03-01 DIAGNOSIS — I509 Heart failure, unspecified: Secondary | ICD-10-CM | POA: Insufficient documentation

## 2013-03-01 DIAGNOSIS — R0989 Other specified symptoms and signs involving the circulatory and respiratory systems: Secondary | ICD-10-CM | POA: Insufficient documentation

## 2013-03-01 DIAGNOSIS — Z01818 Encounter for other preprocedural examination: Secondary | ICD-10-CM | POA: Insufficient documentation

## 2013-03-01 DIAGNOSIS — D729 Disorder of white blood cells, unspecified: Secondary | ICD-10-CM

## 2013-03-01 DIAGNOSIS — R0600 Dyspnea, unspecified: Secondary | ICD-10-CM

## 2013-03-01 LAB — PULMONARY FUNCTION TEST

## 2013-03-01 NOTE — Progress Notes (Signed)
VASCULAR LAB PRELIMINARY  PRELIMINARY  PRELIMINARY  PRELIMINARY  Bilateral lower extremity venous duplex completed.    Preliminary report:  Bilateral:  No evidence of DVT, superficial thrombosis, or Baker's Cyst.   Larrie Fraizer, RVT 03/01/2013, 3:44 PM

## 2013-03-01 NOTE — Progress Notes (Signed)
VASCULAR LAB PRELIMINARY  PRELIMINARY  PRELIMINARY  PRELIMINARY  Pre-op Cardiac Surgery  Carotid Findings:  No evidence of hemodynamically significant ICA stenosis. < 40 % bilaterally. Vertebral artery flow is antegrade.  Upper Extremity Right Left  Brachial Pressures 109 Triphasic 118 Triphasic  Radial Waveforms Triphasic Triphasic  Ulnar Waveforms Triphasic Triphasic  Palmar Arch (Allen's Test) Abnormal Normal   Findings:  Right Doppler waveforms reversed with radial compression and remained normal with ulnar compression. Left Doppler waveforms remained normal with both radial and ulnar compressions.    Lower  Extremity Right Left  Dorsalis Pedis 115 Triphasic 137 Triphasic  Posterior Tibial 117 Biphasic 123 Triphasic  Ankle/Brachial Indices 0.99 1.16    Findings:  Bilateral ABIs and Doppler waveforms are within normal limits at rest.   Brianna Hanson, RVS 03/01/2013, 3:46 PM

## 2013-03-02 ENCOUNTER — Other Ambulatory Visit (HOSPITAL_COMMUNITY): Payer: Self-pay | Admitting: Adult Health

## 2013-03-02 DIAGNOSIS — I5022 Chronic systolic (congestive) heart failure: Secondary | ICD-10-CM

## 2013-03-02 DIAGNOSIS — I5023 Acute on chronic systolic (congestive) heart failure: Secondary | ICD-10-CM

## 2013-03-03 ENCOUNTER — Inpatient Hospital Stay (HOSPITAL_BASED_OUTPATIENT_CLINIC_OR_DEPARTMENT_OTHER)
Admission: RE | Admit: 2013-03-03 | Discharge: 2013-03-03 | Disposition: A | Discharge status: Hospice | Payer: Medicare Other | Source: Ambulatory Visit | Attending: Internal Medicine | Admitting: Internal Medicine

## 2013-03-03 ENCOUNTER — Encounter (HOSPITAL_COMMUNITY): Payer: Self-pay | Admitting: *Deleted

## 2013-03-03 ENCOUNTER — Other Ambulatory Visit: Payer: Self-pay | Admitting: Internal Medicine

## 2013-03-03 ENCOUNTER — Encounter (HOSPITAL_BASED_OUTPATIENT_CLINIC_OR_DEPARTMENT_OTHER)
Admission: RE | Disposition: A | Discharge status: Hospice | Payer: Self-pay | Source: Ambulatory Visit | Attending: Internal Medicine

## 2013-03-03 ENCOUNTER — Inpatient Hospital Stay (HOSPITAL_COMMUNITY): Payer: Medicare Other

## 2013-03-03 ENCOUNTER — Inpatient Hospital Stay (HOSPITAL_COMMUNITY)
Admission: AD | Admit: 2013-03-03 | Discharge: 2013-03-04 | DRG: 292 | Disposition: A | Discharge status: Home Health | Payer: Medicare Other | Source: Ambulatory Visit | Attending: Internal Medicine | Admitting: Internal Medicine

## 2013-03-03 ENCOUNTER — Encounter (HOSPITAL_COMMUNITY): Payer: Medicare Other

## 2013-03-03 DIAGNOSIS — Z79899 Other long term (current) drug therapy: Secondary | ICD-10-CM | POA: Diagnosis not present

## 2013-03-03 DIAGNOSIS — Z452 Encounter for adjustment and management of vascular access device: Secondary | ICD-10-CM | POA: Diagnosis not present

## 2013-03-03 DIAGNOSIS — I5023 Acute on chronic systolic (congestive) heart failure: Secondary | ICD-10-CM | POA: Diagnosis not present

## 2013-03-03 DIAGNOSIS — I4891 Unspecified atrial fibrillation: Secondary | ICD-10-CM | POA: Diagnosis not present

## 2013-03-03 DIAGNOSIS — Z95810 Presence of automatic (implantable) cardiac defibrillator: Secondary | ICD-10-CM | POA: Diagnosis not present

## 2013-03-03 DIAGNOSIS — I5022 Chronic systolic (congestive) heart failure: Secondary | ICD-10-CM | POA: Diagnosis not present

## 2013-03-03 DIAGNOSIS — Z7901 Long term (current) use of anticoagulants: Secondary | ICD-10-CM

## 2013-03-03 DIAGNOSIS — I428 Other cardiomyopathies: Secondary | ICD-10-CM | POA: Diagnosis not present

## 2013-03-03 DIAGNOSIS — I509 Heart failure, unspecified: Secondary | ICD-10-CM

## 2013-03-03 DIAGNOSIS — I10 Essential (primary) hypertension: Secondary | ICD-10-CM | POA: Diagnosis not present

## 2013-03-03 LAB — PROTIME-INR
INR: 2.04 — ABNORMAL HIGH (ref 0.00–1.49)
Prothrombin Time: 22.4 seconds — ABNORMAL HIGH (ref 11.6–15.2)

## 2013-03-03 LAB — BASIC METABOLIC PANEL
BUN: 14 mg/dL (ref 6–23)
CO2: 26 mEq/L (ref 19–32)
Calcium: 8.9 mg/dL (ref 8.4–10.5)
Chloride: 107 mEq/L (ref 96–112)
Creatinine, Ser: 1.18 mg/dL — ABNORMAL HIGH (ref 0.50–1.10)
GFR calc Af Amer: 51 mL/min — ABNORMAL LOW (ref >90)
GFR calc non Af Amer: 44 mL/min — ABNORMAL LOW (ref >90)
Glucose, Bld: 93 mg/dL (ref 70–99)
Potassium: 3.8 mEq/L (ref 3.5–5.1)
Sodium: 142 mEq/L (ref 135–145)

## 2013-03-03 LAB — CBC WITH DIFFERENTIAL/PLATELET
Basophils Absolute: 0 10*3/uL (ref 0.0–0.1)
Basophils Relative: 0 % (ref 0–1)
Eosinophils Absolute: 0.1 10*3/uL (ref 0.0–0.7)
Eosinophils Relative: 3 % (ref 0–5)
HCT: 38.9 % (ref 36.0–46.0)
Hemoglobin: 12.8 g/dL (ref 12.0–15.0)
Lymphocytes Relative: 45 % (ref 12–46)
Lymphs Abs: 2 10*3/uL (ref 0.7–4.0)
MCH: 30.8 pg (ref 26.0–34.0)
MCHC: 32.9 g/dL (ref 30.0–36.0)
MCV: 93.5 fL (ref 78.0–100.0)
Monocytes Absolute: 0.7 10*3/uL (ref 0.1–1.0)
Monocytes Relative: 15 % — ABNORMAL HIGH (ref 3–12)
Neutro Abs: 1.7 10*3/uL (ref 1.7–7.7)
Neutrophils Relative %: 37 % — ABNORMAL LOW (ref 43–77)
Platelets: 169 10*3/uL (ref 150–400)
RBC: 4.16 MIL/uL (ref 3.87–5.11)
RDW: 16.8 % — ABNORMAL HIGH (ref 11.5–15.5)
WBC: 4.5 10*3/uL (ref 4.0–10.5)

## 2013-03-03 LAB — SURGICAL PCR SCREEN
MRSA, PCR: NEGATIVE
Staphylococcus aureus: NEGATIVE

## 2013-03-03 LAB — CARBOXYHEMOGLOBIN
Carboxyhemoglobin: 1.3 % (ref 0.5–1.5)
Methemoglobin: 1.2 % (ref 0.0–1.5)
O2 Saturation: 69.5 %
Total hemoglobin: 13.1 g/dL (ref 12.0–16.0)

## 2013-03-03 LAB — PRO B NATRIURETIC PEPTIDE: Pro B Natriuretic peptide (BNP): 4435 pg/mL — ABNORMAL HIGH (ref 0–450)

## 2013-03-03 SURGERY — JV RIGHT HEART CATHETERIZATION
Anesthesia: Moderate Sedation

## 2013-03-03 MED ORDER — LOSARTAN POTASSIUM 50 MG PO TABS
50.0000 mg | ORAL_TABLET | Freq: Every day | ORAL | Status: DC
  Administered 2013-03-03 – 2013-03-04 (×2): 50 mg via ORAL
  Filled 2013-03-03 (×2): qty 1

## 2013-03-03 MED ORDER — SODIUM CHLORIDE 0.9 % IV SOLN: 250.0000 mL | INTRAVENOUS | Status: DC | PRN

## 2013-03-03 MED ORDER — SODIUM CHLORIDE 0.9 % IJ SOLN: 10.0000 mL | INTRAMUSCULAR | Status: DC | PRN

## 2013-03-03 MED ORDER — WARFARIN - PHARMACIST DOSING INPATIENT: Freq: Every day | Status: DC

## 2013-03-03 MED ORDER — SODIUM CHLORIDE 0.9 % IJ SOLN: 3.0000 mL | Freq: Two times a day (BID) | INTRAMUSCULAR | Status: DC

## 2013-03-03 MED ORDER — SODIUM CHLORIDE 0.9 % IV SOLN: INTRAVENOUS | Status: DC

## 2013-03-03 MED ORDER — MILRINONE IN DEXTROSE 20 MG/100ML IV SOLN
0.2500 ug/kg/min | INTRAVENOUS | Status: DC
  Administered 2013-03-03 – 2013-03-04 (×2): 0.25 ug/kg/min via INTRAVENOUS
  Filled 2013-03-03 (×2): qty 100

## 2013-03-03 MED ORDER — SODIUM CHLORIDE 0.9 % IJ SOLN
10.0000 mL | Freq: Two times a day (BID) | INTRAMUSCULAR | Status: DC
  Administered 2013-03-03: 10 mL

## 2013-03-03 MED ORDER — SODIUM CHLORIDE 0.9 % IV SOLN
250.0000 mL | INTRAVENOUS | Status: DC | PRN
Start: 2013-03-03 — End: 2013-03-04
  Administered 2013-03-03: 250 mL via INTRAVENOUS

## 2013-03-03 MED ORDER — AMIODARONE HCL 200 MG PO TABS
200.0000 mg | ORAL_TABLET | Freq: Every day | ORAL | Status: DC
  Administered 2013-03-03 – 2013-03-04 (×2): 200 mg via ORAL
  Filled 2013-03-03 (×2): qty 1

## 2013-03-03 MED ORDER — ACETAMINOPHEN 325 MG PO TABS
650.0000 mg | ORAL_TABLET | ORAL | Status: DC | PRN
  Administered 2013-03-04: 650 mg via ORAL
  Filled 2013-03-03: qty 2

## 2013-03-03 MED ORDER — ONDANSETRON HCL 4 MG/2ML IJ SOLN: 4.0000 mg | Freq: Four times a day (QID) | INTRAMUSCULAR | Status: DC | PRN

## 2013-03-03 MED ORDER — SODIUM CHLORIDE 0.9 % IJ SOLN: 3.0000 mL | INTRAMUSCULAR | Status: DC | PRN

## 2013-03-03 MED ORDER — ACETAMINOPHEN 325 MG PO TABS: 650.0000 mg | ORAL_TABLET | ORAL | Status: DC | PRN

## 2013-03-03 MED ORDER — WARFARIN SODIUM 2.5 MG PO TABS
2.5000 mg | ORAL_TABLET | Freq: Every day | ORAL | Status: DC
  Administered 2013-03-03: 2.5 mg via ORAL
  Filled 2013-03-03 (×2): qty 1

## 2013-03-03 MED ORDER — SODIUM CHLORIDE 0.9 % IJ SOLN
3.0000 mL | Freq: Two times a day (BID) | INTRAMUSCULAR | Status: DC
  Administered 2013-03-03: 3 mL via INTRAVENOUS

## 2013-03-03 NOTE — Progress Notes (Signed)
Bedrest begins @ 0935, tegaderm dressing applied, site level 0.

## 2013-03-03 NOTE — H&P (Addendum)
Advanced Heart Failure Team Observation Note  EP: Dr Ladona Ridgel  PCP: Dr Yetta Barre Primary Cardiologist:  Dr. Gala Romney  Reason for Consultation: Low output HF  HPI:    Brianna Hanson is a 78 year old female with a PMH of HF due to severe NICM (EF 15%) diagnosed in 1999, cardiac arrest 2010 -V fib S/P ICD, PAF (maintaing SR on amio) - on chronic coumadin and plasma cell disorder (Likely IgA MGUS) - followed by DR. Shadad.   She was followed by Dr Glori Luis at [facility] and was previously on transplant list she was last seen June 2013. Previously on spiro but stopped by Dr. Glori Luis due to hyperkalemia. Has never been on digoxin.   ECHO 02/02/13: EF 15% RV mild to moderately HK.  CPX 02/02/13: Peak VO2 6.4 ml/kg/min; predicted peak 40.1% VE/CO2: 58.3; Peak RER: 1.13; OUES: 0.48  Patient has been followed closely in the HF clinic and has been in the Pre-VAD process. Continues with NYHA IIIB. Denies edema, orthopnea or PND. She presented to the hospital today for a scheduled RHC and was found to have low output HF with elevated R-sided pressures. Brought in for milrinone initiation.   RA = 11  RV = 51/11/16  PA = 53/20 (32)  PCW = 10  Fick cardiac output/index = 2.6/1.5  Thermo CO/CI = 2.3/1.3  PVR (Fick) = 8.5 Woods  FA sat = 92%  PA sat = 45%, 51%   Review of Systems: [y] = yes, [ ]  = no   General: Weight gain Klaus.Mock ]; Weight loss [ ] ; Anorexia [ ] ; Fatigue [ ] ; Fever [ ] ; Chills [ ] ; Weakness [ ]   Cardiac: Chest pain/pressure [ N]; Resting SOB [ N]; Exertional SOB [Y ]; Orthopnea [ Y]; Pedal Edema [ ] ; Palpitations [ ] ; Syncope [ ] ; Presyncope [ ] ; Paroxysmal nocturnal dyspnea[ ]   Pulmonary: Cough [ ] ; Wheezing[ ] ; Hemoptysis[ ] ; Sputum [ ] ; Snoring [ ]   GI: Vomiting[ ] ; Dysphagia[ ] ; Melena[ ] ; Hematochezia [ ] ; Heartburn[ ] ; Abdominal pain [ ] ; Constipation [ ] ; Diarrhea [ ] ; BRBPR [ ]   GU: Hematuria[ ] ; Dysuria [ ] ; Nocturia[ ]   Vascular: Pain in legs with walking [ ] ; Pain in feet with lying flat [ ] ;  Non-healing sores [ ] ; Stroke [ ] ; TIA [ ] ; Slurred speech [ ] ;  Neuro: Headaches[ ] ; Vertigo[ ] ; Seizures[ ] ; Paresthesias[ ] ;Blurred vision [ ] ; Diplopia [ ] ; Vision changes [ ]   Ortho/Skin: Arthritis [ ] ; Joint pain [ ] ; Muscle pain [ ] ; Joint swelling [ ] ; Back Pain [ ] ; Rash [ ]   Psych: Depression[ ] ; Anxiety[ ]   Heme: Bleeding problems [ ] ; Clotting disorders [ ] ; Anemia [ ]   Endocrine: Diabetes [ ] ; Thyroid dysfunction[ ]   Home Medications Prior to Admission medications   Medication Sig Start Date End Date Taking? Authorizing Provider  amiodarone (PACERONE) 200 MG tablet Take 1 tablet (200 mg total) by mouth daily. 08/25/12   Hillis Range, MD  carvedilol (COREG) 25 MG tablet TAKE 1 TABLET BY MOUTH TWICE DAILY WITH A MEAL 03/03/13   Hillis Range, MD  Cholecalciferol (VITAMIN D3) 1000 UNITS CAPS Take 1 tablet by mouth daily.      Historical Provider, MD  furosemide (LASIX) 40 MG tablet Take 2 tablets (80 mg total) by mouth 2 (two) times daily. 01/27/13   Dolores Patty, MD  losartan (COZAAR) 50 MG tablet Take 1 tablet (50 mg total) by mouth daily. 02/09/13   Dolores Patty, MD  potassium chloride SA (K-DUR,KLOR-CON) 20 MEQ tablet Take 20 mEq by mouth 2 (two) times daily.    Historical Provider, MD  warfarin (COUMADIN) 2.5 MG tablet Take 1 tablet (2.5 mg total) by mouth as directed. 01/29/13   Wendall Stade, MD    Past Medical History: Past Medical History  Diagnosis Date  . Cardiac arrest - ventricular fibrillation 12/10    with successful resucitation, S/p ICD  . ICD (implantable cardiac defibrillator) in place     she has received appropriate therapy for VF  . Atrial fibrillation or flutter   . Osteopenia   . HTN (hypertension)     moderate  . Nonischemic cardiomyopathy     followed by Dr Glori Luis at [facility]  . CHF (congestive heart failure)   . Anemia   . S/P colonoscopy   . Plasma cell disorder 03/20/2012  . Plasma cell disorder 03/20/2012    Past Surgical  History: Past Surgical History  Procedure Laterality Date  . Abdominal hysterectomy    . Cardiac catheterization    . Cardiac defibrillator placement      by JA for secondary prevention of sudden death    Family History: Family History  Problem Relation Age of Onset  . Stroke Brother   . Stroke Mother     Social History: History   Social History  . Marital Status: Single    Spouse Name: N/A    Number of Children: 3  . Years of Education: N/A   Social History Main Topics  . Smoking status: Former Games developer  . Smokeless tobacco: Never Used     Comment: remote history of tobacco abuse  . Alcohol Use: No  . Drug Use: No  . Sexually Active: Not Currently    Birth Control/ Protection: Surgical   Other Topics Concern  . None   Social History Narrative   Lives with spouse who was recently diagnosed with esophageal ca and is receiving chemotherapy    Allergies:  No Known Allergies  Objective:    Vital Signs:   Temp:  [97.5 F (36.4 C)-98.5 F (36.9 C)] 98.5 F (36.9 C) (07/02 1600) Pulse Rate:  [60] 60 (07/02 0934) Resp:  [18] 18 (07/02 0934) BP: (97-126)/(52-88) 112/61 mmHg (07/02 1700) SpO2:  [90 %-96 %] 95 % (07/02 1610) Weight:  [76.658 kg (169 lb)] 76.658 kg (169 lb) (07/02 1157)    Weight change: Filed Weights   03/03/13 1157  Weight: 76.658 kg (169 lb)    Intake/Output:  No intake or output data in the 24 hours ending 03/03/13 1819   Physical Exam: General:  Chronically ill appearing. No resp difficulty HEENT: normal Neck: supple. JVP 10. Carotids 2+ bilat; no bruits. No lymphadenopathy or thryomegaly appreciated. Cor: PMI nondisplaced. Regular rate & rhythm. +s3 Lungs: clear Abdomen: soft, nontender, nondistended. No hepatosplenomegaly. No bruits or masses. Good bowel sounds. Extremities: no cyanosis, clubbing, rash, edema Neuro: alert & orientedx3, cranial nerves grossly intact. moves all 4 extremities w/o difficulty. Affect  pleasant  Telemetry: a-paced 60  Labs: Basic Metabolic Panel:  Recent Labs Lab 03/03/13 1429  NA 142  K 3.8  CL 107  CO2 26  GLUCOSE 93  BUN 14  CREATININE 1.18*  CALCIUM 8.9    Liver Function Tests: No results found for this basename: AST, ALT, ALKPHOS, BILITOT, PROT, ALBUMIN,  in the last 168 hours No results found for this basename: LIPASE, AMYLASE,  in the last 168 hours No results found for this basename: AMMONIA,  in the last 168 hours  CBC:  Recent Labs Lab 03/03/13 1429  WBC 4.5  NEUTROABS 1.7  HGB 12.8  HCT 38.9  MCV 93.5  PLT 169    Cardiac Enzymes: No results found for this basename: CKTOTAL, CKMB, CKMBINDEX, TROPONINI,  in the last 168 hours  BNP: BNP (last 3 results)  Recent Labs  01/13/13 1639 03/03/13 1429  PROBNP 1614.0* 4435.0*    CBG: No results found for this basename: GLUCAP,  in the last 168 hours  Coagulation Studies:  Recent Labs  03/03/13 1429  LABPROT 22.4*  INR 2.04*    Other results:   Imaging: Dg Chest Port 1 View  03/03/2013   *RADIOLOGY REPORT*  Clinical Data: Evaluate PICC line placement  PORTABLE CHEST - 1 VIEW  Comparison: 03/01/2013  Findings: There is a left chest wall ICD with lead in the right atrial appendage and right ventricle.  There is a right arm PICC line with tip in the SVC.  There is moderate cardiac enlargement. No pleural effusion or edema.  IMPRESSION:  1.  Cardiac enlargement.  No heart failure. 2.  Right arm PICC line tip is in the SVC.   Original Report Authenticated By: Signa Kell, M.D.     Medications:     Current Medications: . amiodarone  200 mg Oral Daily  . losartan  50 mg Oral Daily  . sodium chloride  10-40 mL Intracatheter Q12H  . sodium chloride  3 mL Intravenous Q12H  . warfarin  2.5 mg Oral q1800  . Warfarin - Pharmacist Dosing Inpatient   Does not apply q1800    Infusions: . milrinone 0.25 mcg/kg/min (03/03/13 1245)     Assessment:   1) Chronic systolic HF with  biventricular failure and low output physiology 2) NICM,      EF 15%. Mild to moderate RV dysfunction     --s/p ICD 3) PAF   Plan/Discussion:    Brianna. Kozuch had evidence of severe biventricular HF with low output physiology. Will birng in for observation to have PICC line placed and start milrinone. Continue coumadin for AF.  Will need repeat RHC (+/- coronary angio) in near future to assess full response to milrinone with special attention to RA pressures and PVR to assess candidacy for VAD. Stop b-blocker due to low output.   Will try to get home tomorrow once [facility] arranged with home milrinone.   Length of Stay: 0  Arvilla Meres 03/03/2013, 6:19 PM  Advanced Heart Failure Team Pager [phone] (M-F; 7a - 4p)  Please contact [facility] for night-coverage after hours (4p -7a ) and weekends on [email or web address]

## 2013-03-03 NOTE — Interval H&P Note (Signed)
History and Physical Interval Note:  03/03/2013 8:48 AM  Brianna Hanson  has presented today for surgery, with the diagnosis of HF  The various methods of treatment have been discussed with the patient and family. After consideration of risks, benefits and other options for treatment, the patient has consented to  Procedure(s): JV RIGHT HEART CATHETERIZATION (N/A) as a surgical intervention .  [name] history has been reviewed, patient examined, no change in status, stable for surgery.  I have reviewed the patient's chart and labs.  Questions were answered to the patient's satisfaction.     France Noyce

## 2013-03-03 NOTE — CV Procedure (Addendum)
Cardiac Cath Procedure Note:  Indication:  Heart failure. Pre-VAD wok-up  Procedures performed:  1) Right heart catheterization  Description of procedure:   The risks and indication of the procedure were explained. Consent was signed and placed on the chart. An appropriate timeout was taken prior to the procedure. The right groin was prepped and draped in the routine sterile fashion and anesthetized with 1% local lidocaine.   A 7 FR venous sheath was placed in the right femoral vein using a modified Seldinger technique. A standard Swan-Ganz catheter was used for the procedure.   Complications: None apparent.  Findings:  RA = 11 RV =  51/11/16 PA =  53/20 (32) PCW = 10 Fick cardiac output/index = 2.6/1.5 Thermo CO/CI = 2.3/1.3 PVR (Fick)  = 8.5 Woods FA sat = 92% PA sat = 45%, 51%  Assessment: 1. Low output HF with biventricular HF 2. Mild PAH with elevated PVR 3. Well compensated left-sided filling pressures  Plan/Discussion:  Will bring in for observation with placement of PICC line and initiation of milrinone. Will need repeat RHC cath (with possible coronary angio) in near future to assess response to milrinone. Will review echo to look at RV function.   Jibri Schriefer,MD 9:24 AM

## 2013-03-03 NOTE — H&P (View-Only) (Signed)
Patient ID: Brianna Hanson, female   DOB: April 18, 1935, 78 y.o.   MRN: [id number] EP: Dr Ladona Ridgel PCP: Dr Yetta Barre  Coumadin- [facility]  HPI:  Brianna Hanson is a 78 year old female with a PMH of HF due to severe NICM (EF 15%) diagnosed in 1999, cardiac arrest 2010 -V fib S/P ICD,  PAF (maintaing SR on amio) - on chronic coumadin and plasma cell disorder (Likely IgA MGUS) - followed by DR. Shadad.   She was followed by Dr Glori Luis at [facility] and was previously on transplant list she was last seen June 2013. She has not followed for HF since that time because Dr Glori Luis is no longer at [facility].  Prior to his departure she was feeling good and only being seen in the HF Clinic 1x/year. Previously on spiro but stopped by Dr. Glori Luis due to hyperkalemia. Has never been on digoxin.   We saw her in the HF Clinic 2 weeks ago for her first visit. Last visit lasix increased to 80 mg bid. And CPX ordered. Weight stable. But breathing better. No edema. Drinks a lot of water > 2L.  SOB with mild activity. Denies PND. +orthopnea  Sleeps on 2 pillows. Lives with her boyfriend Brianna Hanson)  > 31 years.   ECHO 02/02/13 EF 15% RV mild to moderately HK.   CPX 02/02/13  Peak VO2: 6.4 ml/kg/min  predicted peak VO2: 40.1% VE/VCO2 slope: 58.3 OUES: 0.48 Peak RER: 1.13    Past Medical History  Diagnosis Date  . Cardiac arrest - ventricular fibrillation 12/10    with successful resucitation, S/p ICD  . ICD (implantable cardiac defibrillator) in place     she has received appropriate therapy for VF  . Atrial fibrillation or flutter   . Osteopenia   . HTN (hypertension)     moderate  . Nonischemic cardiomyopathy     followed by Dr Glori Luis at [facility]  . CHF (congestive heart failure)   . Anemia   . S/P colonoscopy   . Plasma cell disorder 03/20/2012  . Plasma cell disorder 03/20/2012    Current Outpatient Prescriptions  Medication Sig Dispense Refill  . amiodarone (PACERONE) 200 MG tablet Take 1 tablet (200 mg total)  by mouth daily.  30 tablet  6  . carvedilol (COREG) 25 MG tablet Take 1 tablet (25 mg total) by mouth 2 (two) times daily with a meal.  180 tablet  1  . Cholecalciferol (VITAMIN D3) 1000 UNITS CAPS Take 1 tablet by mouth daily.        . furosemide (LASIX) 40 MG tablet Take 2 tablets (80 mg total) by mouth 2 (two) times daily.  120 tablet  3  . losartan (COZAAR) 25 MG tablet Take 1 tablet by mouth daily.      . potassium chloride SA (K-DUR,KLOR-CON) 20 MEQ tablet Take 20 mEq by mouth 2 (two) times daily.      [name] warfarin (COUMADIN) 2.5 MG tablet Take 1 tablet (2.5 mg total) by mouth as directed.  45 tablet  3   No current facility-administered medications for this encounter.     No Known Allergies  History   Social History  . Marital Status: Single    Spouse Name: N/A    Number of Children: 3  . Years of Education: N/A   Occupational History  . Not on file.   Social History Main Topics  . Smoking status: Former Games developer  . Smokeless tobacco: Never Used     Comment: remote  history of tobacco abuse  . Alcohol Use: No  . Drug Use: No  . Sexually Active: Not Currently    Birth Control/ Protection: Surgical   Other Topics Concern  . Not on file   Social History Narrative   Lives with spouse who was recently diagnosed with esophageal ca and is receiving chemotherapy    Family History  Problem Relation Age of Onset  . Stroke Brother   . Stroke Mother     PHYSICAL EXAM: Filed Vitals:   02/09/13 1047  BP: 116/80  Pulse: 62   General:  Well appearing. No respiratory difficulty HEENT: normal Neck: supple. JVP 7. Carotids 2+ bilat; no bruits. No lymphadenopathy or thryomegaly appreciated. Cor: PMI nondisplaced. Regular rate & rhythm. No rubs, gallops or murmurs. Lungs: clear Abdomen: soft, nontender, nondistended. No hepatosplenomegaly. No bruits or masses. Good bowel sounds. Extremities: no cyanosis, clubbing, rash, tr edema Neuro: alert & oriented x 3, cranial nerves  grossly intact. moves all 4 extremities w/o difficulty. Affect pleasant.   No results found for this or any previous visit (from the past 24 hour(s)). No results found.   ASSESSMENT & PLAN:

## 2013-03-03 NOTE — OR Nursing (Signed)
Report called to Vernona Rieger, RN on 2900

## 2013-03-03 NOTE — OR Nursing (Signed)
Transported to 2925 via stretcher, on monitor without change

## 2013-03-03 NOTE — Care Management Note (Addendum)
    Page 1 of 2   03/04/2013     8:52:41 AM   CARE MANAGEMENT NOTE 03/04/2013  Patient:  Brianna Hanson, Brianna Hanson   Account Number:  [id number]  Date Initiated:  03/03/2013  Documentation initiated by:  Junius Creamer  Subjective/Objective Assessment:   adm w heart fialure     Action/Plan:   lives w fam, pcp dr Sanda Linger   Anticipated DC Date:  03/04/2013   Anticipated DC Plan:  HOME W HOME HEALTH SERVICES      DC Planning Services  CM consult      PAC Choice  DURABLE MEDICAL EQUIPMENT  HOME HEALTH   Choice offered to / List presented to:  C-1 Patient   DME arranged  IV PUMP/EQUIPMENT      DME agency  [facility]     [facility] arranged  HH-1 RN  HH-10 DISEASE MANAGEMENT      HH agency  [facility]   Status of service:   Medicare Important Message given?   (If response is "NO", the following Medicare IM given date fields will be blank) Date Medicare IM given:   Date Additional Medicare IM given:    Discharge Disposition:  HOME W HOME HEALTH SERVICES  Per UR Regulation:  Reviewed for med. necessity/level of care/duration of stay  If discussed at Long Length of Stay Meetings, dates discussed:    Comments:  7/3  0850 debbie Kameran Lallier rn,bsn amy clepp np called and pt for dc home today on home iv milrinone. spoke w pt and went over list of hhc agencies. no pref. ref to pam and donna w [facility] for hhrn and home milrinone.  7/2 1216 debbie Adil Tugwell rn,bsn spoke w np amy clegg. pt to start on iv milrinone. poss may need home milrinone. will follow and assist as needed.

## 2013-03-03 NOTE — Progress Notes (Signed)
Peripherally Inserted Central Catheter/Midline Placement  The IV Nurse has discussed with the patient and/or persons authorized to consent for the patient, the purpose of this procedure and the potential benefits and risks involved with this procedure.  The benefits include less needle sticks, lab draws from the catheter and patient may be discharged home with the catheter.  Risks include, but not limited to, infection, bleeding, blood clot (thrombus formation), and puncture of an artery; nerve damage and irregular heat beat.  Alternatives to this procedure were also discussed.  PICC/Midline Placement Documentation        Brianna Hanson 03/03/2013, 4:20 PM

## 2013-03-03 NOTE — Progress Notes (Addendum)
ANTICOAGULATION CONSULT NOTE - Initial Consult  Pharmacy Consult for Coumadin Indication: atrial fibrillation  No Known Allergies  Patient Measurements: Height: 5' (152.4 cm) Weight: 169 lb (76.658 kg) IBW/kg (Calculated) : 45.5  Vital Signs: Temp: 97.5 F (36.4 C) (07/02 1200) Temp src: Oral (07/02 1200) BP: 116/83 mmHg (07/02 1200) Pulse Rate: 60 (07/02 0934)  Labs: No results found for this basename: HGB, HCT, PLT, APTT, LABPROT, INR, HEPARINUNFRC, CREATININE, CKTOTAL, CKMB, TROPONINI,  in the last 72 hours  Estimated Creatinine Clearance: 32.9 ml/min (by C-G formula based on Cr of 1.33).   Medical History: Past Medical History  Diagnosis Date  . Cardiac arrest - ventricular fibrillation 12/10    with successful resucitation, S/p ICD  . ICD (implantable cardiac defibrillator) in place     she has received appropriate therapy for VF  . Atrial fibrillation or flutter   . Osteopenia   . HTN (hypertension)     moderate  . Nonischemic cardiomyopathy     followed by Dr Glori Luis at [facility]  . CHF (congestive heart failure)   . Anemia   . S/P colonoscopy   . Plasma cell disorder 03/20/2012  . Plasma cell disorder 03/20/2012   Assessment: 78 year old female with PMH of HF due to severe NICM (EF=15%) and PAF, s/p cath today admitted for PICC line and initiation of milrinone.  On Coumadin PTA  Goal of Therapy:  INR 2-3 Monitor platelets by anticoagulation protocol: Yes   Plan:  1) Admit INR = 2.04, continue Coumadin 2.5 mg daily 2) Daily INR  Thank you. Okey Regal, PharmD [phone]  03/03/2013,12:50 PM

## 2013-03-04 DIAGNOSIS — I509 Heart failure, unspecified: Secondary | ICD-10-CM | POA: Diagnosis not present

## 2013-03-04 DIAGNOSIS — Z79899 Other long term (current) drug therapy: Secondary | ICD-10-CM | POA: Diagnosis not present

## 2013-03-04 DIAGNOSIS — I4891 Unspecified atrial fibrillation: Secondary | ICD-10-CM | POA: Diagnosis not present

## 2013-03-04 DIAGNOSIS — I5023 Acute on chronic systolic (congestive) heart failure: Secondary | ICD-10-CM | POA: Diagnosis not present

## 2013-03-04 DIAGNOSIS — Z7901 Long term (current) use of anticoagulants: Secondary | ICD-10-CM | POA: Diagnosis not present

## 2013-03-04 DIAGNOSIS — I5022 Chronic systolic (congestive) heart failure: Secondary | ICD-10-CM | POA: Diagnosis not present

## 2013-03-04 DIAGNOSIS — Z452 Encounter for adjustment and management of vascular access device: Secondary | ICD-10-CM | POA: Diagnosis not present

## 2013-03-04 DIAGNOSIS — I428 Other cardiomyopathies: Secondary | ICD-10-CM

## 2013-03-04 DIAGNOSIS — I10 Essential (primary) hypertension: Secondary | ICD-10-CM | POA: Diagnosis not present

## 2013-03-04 LAB — BASIC METABOLIC PANEL
BUN: 14 mg/dL (ref 6–23)
CO2: 23 mEq/L (ref 19–32)
Calcium: 8.5 mg/dL (ref 8.4–10.5)
Chloride: 106 mEq/L (ref 96–112)
Creatinine, Ser: 1.14 mg/dL — ABNORMAL HIGH (ref 0.50–1.10)
GFR calc Af Amer: 53 mL/min — ABNORMAL LOW (ref >90)
GFR calc non Af Amer: 46 mL/min — ABNORMAL LOW (ref >90)
Glucose, Bld: 103 mg/dL — ABNORMAL HIGH (ref 70–99)
Potassium: 3.8 mEq/L (ref 3.5–5.1)
Sodium: 139 mEq/L (ref 135–145)

## 2013-03-04 LAB — POCT I-STAT 3, VENOUS BLOOD GAS (G3P V)
Bicarbonate: 24.9 mEq/L — ABNORMAL HIGH (ref 20.0–24.0)
Bicarbonate: 25.2 mEq/L — ABNORMAL HIGH (ref 20.0–24.0)
O2 Saturation: 45 %
O2 Saturation: 51 %
TCO2: 26 mmol/L (ref 0–100)
TCO2: 27 mmol/L (ref 0–100)
pCO2, Ven: 41.7 mmHg — ABNORMAL LOW (ref 45.0–50.0)
pCO2, Ven: 42.4 mmHg — ABNORMAL LOW (ref 45.0–50.0)
pH, Ven: 7.383 — ABNORMAL HIGH (ref 7.250–7.300)
pH, Ven: 7.384 — ABNORMAL HIGH (ref 7.250–7.300)
pO2, Ven: 25 mmHg — CL (ref 30.0–45.0)
pO2, Ven: 28 mmHg — CL (ref 30.0–45.0)

## 2013-03-04 LAB — POCT I-STAT 3, ART BLOOD GAS (G3+)
Acid-Base Excess: 1 mmol/L (ref 0.0–2.0)
Bicarbonate: 23.9 mEq/L (ref 20.0–24.0)
O2 Saturation: 92 %
TCO2: 25 mmol/L (ref 0–100)
pCO2 arterial: 33.7 mmHg — ABNORMAL LOW (ref 35.0–45.0)
pH, Arterial: 7.46 — ABNORMAL HIGH (ref 7.350–7.450)
pO2, Arterial: 58 mmHg — ABNORMAL LOW (ref 80.0–100.0)

## 2013-03-04 LAB — CARBOXYHEMOGLOBIN
Carboxyhemoglobin: 1.4 % (ref 0.5–1.5)
Methemoglobin: 1.2 % (ref 0.0–1.5)
O2 Saturation: 66.3 %
Total hemoglobin: 13.7 g/dL (ref 12.0–16.0)

## 2013-03-04 MED ORDER — FUROSEMIDE 80 MG PO TABS: 80.0000 mg | ORAL_TABLET | Freq: Two times a day (BID) | ORAL | Status: DC

## 2013-03-04 MED ORDER — MILRINONE IN DEXTROSE 20 MG/100ML IV SOLN: 0.2500 ug/kg/min | INTRAVENOUS | Status: DC

## 2013-03-04 NOTE — Progress Notes (Signed)
Advanced Heart Failure Rounding Note   Subjective:     I/Os + but weight down 2 pounds. Feels much better on milrinone. Co-ox much improved.   Cardiac output/index now 3.8/2.1   Objective:   Weight Range:  Vital Signs:   Temp:  [97.4 F (36.3 C)-98.5 F (36.9 C)] 97.9 F (36.6 C) (07/03 0728) Pulse Rate:  [60] 60 (07/02 0934) Resp:  [16-20] 18 (07/03 0728) BP: (96-128)/(46-88) 122/64 mmHg (07/03 0700) SpO2:  [90 %-95 %] 93 % (07/03 0728) Weight:  [76.2 kg (167 lb 15.9 oz)-76.658 kg (169 lb)] 76.2 kg (167 lb 15.9 oz) (07/03 0431)    Weight change: Filed Weights   03/03/13 1157 03/04/13 0431  Weight: 76.658 kg (169 lb) 76.2 kg (167 lb 15.9 oz)    Intake/Output:   Intake/Output Summary (Last 24 hours) at 03/04/13 0756 Last data filed at 03/04/13 0700  Gross per 24 hour  Intake 617.18 ml  Output      0 ml  Net 617.18 ml     JVP measured personally 9-10  Physical Exam:  General: Chronically ill appearing. No resp difficulty  HEENT: normal  Neck: supple. JVP 10. Carotids 2+ bilat; no bruits. No lymphadenopathy or thryomegaly appreciated.  Cor: PMI nondisplaced. Regular rate & rhythm. +s3  Lungs: clear  Abdomen: soft, nontender, nondistended. No hepatosplenomegaly. No bruits or masses. Good bowel sounds.  Extremities: no cyanosis, clubbing, rash, edema  Neuro: alert & orientedx3, cranial nerves grossly intact. moves all 4 extremities w/o difficulty. Affect pleasant   Telemetry: AV paced 60  Labs: Basic Metabolic Panel:  Recent Labs Lab 03/03/13 1429 03/04/13 0445  NA 142 139  K 3.8 3.8  CL 107 106  CO2 26 23  GLUCOSE 93 103*  BUN 14 14  CREATININE 1.18* 1.14*  CALCIUM 8.9 8.5    Liver Function Tests: No results found for this basename: AST, ALT, ALKPHOS, BILITOT, PROT, ALBUMIN,  in the last 168 hours No results found for this basename: LIPASE, AMYLASE,  in the last 168 hours No results found for this basename: AMMONIA,  in the last 168  hours  CBC:  Recent Labs Lab 03/03/13 1429  WBC 4.5  NEUTROABS 1.7  HGB 12.8  HCT 38.9  MCV 93.5  PLT 169    Cardiac Enzymes: No results found for this basename: CKTOTAL, CKMB, CKMBINDEX, TROPONINI,  in the last 168 hours  BNP: BNP (last 3 results)  Recent Labs  01/13/13 1639 03/03/13 1429  PROBNP 1614.0* 4435.0*     Other results:   Imaging: Dg Chest Port 1 View  03/03/2013   *RADIOLOGY REPORT*  Clinical Data: Evaluate PICC line placement  PORTABLE CHEST - 1 VIEW  Comparison: 03/01/2013  Findings: There is a left chest wall ICD with lead in the right atrial appendage and right ventricle.  There is a right arm PICC line with tip in the SVC.  There is moderate cardiac enlargement. No pleural effusion or edema.  IMPRESSION:  1.  Cardiac enlargement.  No heart failure. 2.  Right arm PICC line tip is in the SVC.   Original Report Authenticated By: Signa Kell, M.D.      Medications:     Scheduled Medications: . amiodarone  200 mg Oral Daily  . losartan  50 mg Oral Daily  . sodium chloride  10-40 mL Intracatheter Q12H  . sodium chloride  3 mL Intravenous Q12H  . warfarin  2.5 mg Oral q1800  . Warfarin - Pharmacist Dosing Inpatient  Does not apply q1800     Infusions: . milrinone 0.25 mcg/kg/min (03/04/13 0427)     PRN Medications:  sodium chloride, acetaminophen, ondansetron (ZOFRAN) IV, sodium chloride, sodium chloride   Assessment:   1) Chronic systolic HF with biventricular failure and low output physiology  2) NICM,  EF 15%. Mild to moderate RV dysfunction  --s/p ICD  3) PAF   Plan/Discussion:     Much improved on milrinone. Will aim for d/c home today with HHRN and milrinone. Will need repeat echo anf  RHC (+/- cor angio) in 1-2 weeks to assess full response to milrinone and decide on VAD candidacy.  Will d/c later today if [facility] and home milrinone in place.  Daniel [name],MD 8:05 AM  Length of Stay: 1 Advanced Heart Failure  Team Pager [phone] (M-F; 7a - 4p)  Please contact [facility] for night-coverage after hours (4p -7a ) and weekends on [email or web address]

## 2013-03-04 NOTE — Progress Notes (Signed)
ANTICOAGULATION CONSULT NOTE   Pharmacy Consult for Coumadin Indication: atrial fibrillation  No Known Allergies  Labs:  Recent Labs  03/03/13 1429 03/04/13 0445  HGB 12.8  --   HCT 38.9  --   PLT 169  --   LABPROT 22.4*  --   INR 2.04*  --   CREATININE 1.18* 1.14*    Estimated Creatinine Clearance: 38.3 ml/min (by C-G formula based on Cr of 1.14).  Assessment: 78 year old female with PMH of HF due to severe NICM (EF=15%) and PAF, s/p cath admitted for PICC line and initiation of milrinone.  On Coumadin PTA  Goal of Therapy:  INR 2-3 Monitor platelets by anticoagulation protocol: Yes   Plan:  1) Continue Coumadin 2.5 mg daily 2) Daily INR  Thank you. Okey Regal, PharmD [phone]  03/04/2013,9:25 AM

## 2013-03-04 NOTE — Progress Notes (Signed)
Brianna Clegg, Np called to verify home dose of lasix. Lasix 80mg  po in am and lasix 40mg  po in pm. Pt verbalized understanding.

## 2013-03-09 ENCOUNTER — Encounter (HOSPITAL_COMMUNITY): Payer: Self-pay | Admitting: *Deleted

## 2013-03-09 ENCOUNTER — Telehealth (HOSPITAL_COMMUNITY): Payer: Self-pay | Admitting: *Deleted

## 2013-03-09 DIAGNOSIS — I5022 Chronic systolic (congestive) heart failure: Secondary | ICD-10-CM

## 2013-03-09 NOTE — Telephone Encounter (Signed)
Per Dr Gala Romney pt needs repeat right and left heart cath, cath sch for Fri 6/11 at 12:30 pt aware and instructions reviewed with her via phone, she will go to [facility] on [address] for labs and not take anymore coumadin

## 2013-03-10 ENCOUNTER — Other Ambulatory Visit (HOSPITAL_COMMUNITY): Payer: Self-pay | Admitting: *Deleted

## 2013-03-10 ENCOUNTER — Ambulatory Visit (HOSPITAL_COMMUNITY): Payer: Medicare Other

## 2013-03-10 ENCOUNTER — Other Ambulatory Visit: Payer: Self-pay

## 2013-03-11 ENCOUNTER — Other Ambulatory Visit (INDEPENDENT_AMBULATORY_CARE_PROVIDER_SITE_OTHER): Payer: Medicare Other

## 2013-03-11 DIAGNOSIS — I4891 Unspecified atrial fibrillation: Secondary | ICD-10-CM | POA: Diagnosis not present

## 2013-03-11 DIAGNOSIS — I5022 Chronic systolic (congestive) heart failure: Secondary | ICD-10-CM

## 2013-03-11 DIAGNOSIS — Z452 Encounter for adjustment and management of vascular access device: Secondary | ICD-10-CM | POA: Diagnosis not present

## 2013-03-11 DIAGNOSIS — I5023 Acute on chronic systolic (congestive) heart failure: Secondary | ICD-10-CM | POA: Diagnosis not present

## 2013-03-11 DIAGNOSIS — Z7901 Long term (current) use of anticoagulants: Secondary | ICD-10-CM | POA: Diagnosis not present

## 2013-03-11 DIAGNOSIS — I10 Essential (primary) hypertension: Secondary | ICD-10-CM | POA: Diagnosis not present

## 2013-03-11 DIAGNOSIS — I509 Heart failure, unspecified: Secondary | ICD-10-CM | POA: Diagnosis not present

## 2013-03-11 LAB — BASIC METABOLIC PANEL
BUN: 14 mg/dL (ref 6–23)
CO2: 24 mEq/L (ref 19–32)
Calcium: 8.8 mg/dL (ref 8.4–10.5)
Chloride: 108 mEq/L (ref 96–112)
Creatinine, Ser: 1.3 mg/dL — ABNORMAL HIGH (ref 0.4–1.2)
GFR: 53.06 mL/min — ABNORMAL LOW (ref >60.00)
Glucose, Bld: 94 mg/dL (ref 70–99)
Potassium: 3.3 mEq/L — ABNORMAL LOW (ref 3.5–5.1)
Sodium: 137 mEq/L (ref 135–145)

## 2013-03-11 LAB — CBC
HCT: 42.2 % (ref 36.0–46.0)
Hemoglobin: 14 g/dL (ref 12.0–15.0)
MCHC: 33.2 g/dL (ref 30.0–36.0)
MCV: 94.3 fl (ref 78.0–100.0)
Platelets: 178 10*3/uL (ref 150.0–400.0)
RBC: 4.47 Mil/uL (ref 3.87–5.11)
RDW: 17.6 % — ABNORMAL HIGH (ref 11.5–14.6)
WBC: 6.2 10*3/uL (ref 4.5–10.5)

## 2013-03-11 LAB — PROTIME-INR
INR: 1.4 ratio — ABNORMAL HIGH (ref 0.8–1.0)
Prothrombin Time: 15.2 s — ABNORMAL HIGH (ref 10.2–12.4)

## 2013-03-12 ENCOUNTER — Inpatient Hospital Stay (HOSPITAL_COMMUNITY)
Admission: AD | Admit: 2013-03-12 | Discharge: 2013-03-15 | DRG: 314 | Disposition: A | Discharge status: Home / Self Care | Payer: Medicare Other | Source: Ambulatory Visit | Attending: Internal Medicine | Admitting: Internal Medicine

## 2013-03-12 ENCOUNTER — Encounter (HOSPITAL_BASED_OUTPATIENT_CLINIC_OR_DEPARTMENT_OTHER)
Admission: RE | Disposition: A | Discharge status: Hospice | Payer: Self-pay | Source: Ambulatory Visit | Attending: Internal Medicine

## 2013-03-12 ENCOUNTER — Inpatient Hospital Stay (HOSPITAL_BASED_OUTPATIENT_CLINIC_OR_DEPARTMENT_OTHER)
Admission: RE | Admit: 2013-03-12 | Discharge: 2013-03-12 | Disposition: A | Discharge status: Hospice | Payer: Medicare Other | Source: Ambulatory Visit | Attending: Internal Medicine | Admitting: Internal Medicine

## 2013-03-12 ENCOUNTER — Encounter (HOSPITAL_COMMUNITY): Payer: Self-pay | Admitting: Cardiology

## 2013-03-12 DIAGNOSIS — Z87891 Personal history of nicotine dependence: Secondary | ICD-10-CM | POA: Diagnosis not present

## 2013-03-12 DIAGNOSIS — B999 Unspecified infectious disease: Secondary | ICD-10-CM | POA: Diagnosis present

## 2013-03-12 DIAGNOSIS — N182 Chronic kidney disease, stage 2 (mild): Secondary | ICD-10-CM | POA: Diagnosis present

## 2013-03-12 DIAGNOSIS — R791 Abnormal coagulation profile: Secondary | ICD-10-CM | POA: Diagnosis present

## 2013-03-12 DIAGNOSIS — T80212A Local infection due to central venous catheter, initial encounter: Principal | ICD-10-CM | POA: Diagnosis present

## 2013-03-12 DIAGNOSIS — Z95810 Presence of automatic (implantable) cardiac defibrillator: Secondary | ICD-10-CM

## 2013-03-12 DIAGNOSIS — Z8674 Personal history of sudden cardiac arrest: Secondary | ICD-10-CM | POA: Diagnosis not present

## 2013-03-12 DIAGNOSIS — Z7901 Long term (current) use of anticoagulants: Secondary | ICD-10-CM

## 2013-03-12 DIAGNOSIS — I129 Hypertensive chronic kidney disease with stage 1 through stage 4 chronic kidney disease, or unspecified chronic kidney disease: Secondary | ICD-10-CM | POA: Diagnosis present

## 2013-03-12 DIAGNOSIS — I428 Other cardiomyopathies: Secondary | ICD-10-CM | POA: Diagnosis present

## 2013-03-12 DIAGNOSIS — T80218A Other infection due to central venous catheter, initial encounter: Secondary | ICD-10-CM

## 2013-03-12 DIAGNOSIS — I10 Essential (primary) hypertension: Secondary | ICD-10-CM | POA: Diagnosis present

## 2013-03-12 DIAGNOSIS — T80219D Unspecified infection due to central venous catheter, subsequent encounter: Secondary | ICD-10-CM

## 2013-03-12 DIAGNOSIS — I5023 Acute on chronic systolic (congestive) heart failure: Secondary | ICD-10-CM | POA: Diagnosis not present

## 2013-03-12 DIAGNOSIS — I5022 Chronic systolic (congestive) heart failure: Secondary | ICD-10-CM | POA: Diagnosis not present

## 2013-03-12 DIAGNOSIS — I509 Heart failure, unspecified: Secondary | ICD-10-CM | POA: Diagnosis not present

## 2013-03-12 DIAGNOSIS — I4891 Unspecified atrial fibrillation: Secondary | ICD-10-CM

## 2013-03-12 DIAGNOSIS — C439 Malignant melanoma of skin, unspecified: Secondary | ICD-10-CM | POA: Diagnosis not present

## 2013-03-12 DIAGNOSIS — T80219A Unspecified infection due to central venous catheter, initial encounter: Secondary | ICD-10-CM

## 2013-03-12 DIAGNOSIS — Z5189 Encounter for other specified aftercare: Secondary | ICD-10-CM | POA: Diagnosis not present

## 2013-03-12 DIAGNOSIS — Y849 Medical procedure, unspecified as the cause of abnormal reaction of the patient, or of later complication, without mention of misadventure at the time of the procedure: Secondary | ICD-10-CM | POA: Diagnosis present

## 2013-03-12 DIAGNOSIS — R059 Cough, unspecified: Secondary | ICD-10-CM | POA: Diagnosis not present

## 2013-03-12 LAB — POCT I-STAT 3, ART BLOOD GAS (G3+)
Acid-base deficit: 3 mmol/L — ABNORMAL HIGH (ref 0.0–2.0)
Acid-base deficit: 3 mmol/L — ABNORMAL HIGH (ref 0.0–2.0)
Bicarbonate: 20.8 mEq/L (ref 20.0–24.0)
Bicarbonate: 21.2 mEq/L (ref 20.0–24.0)
O2 Saturation: 81 %
O2 Saturation: 84 %
TCO2: 22 mmol/L (ref 0–100)
TCO2: 22 mmol/L (ref 0–100)
pCO2 arterial: 34 mmHg — ABNORMAL LOW (ref 35.0–45.0)
pCO2 arterial: 34.7 mmHg — ABNORMAL LOW (ref 35.0–45.0)
pH, Arterial: 7.394 (ref 7.350–7.450)
pH, Arterial: 7.394 (ref 7.350–7.450)
pO2, Arterial: 45 mmHg — ABNORMAL LOW (ref 80.0–100.0)
pO2, Arterial: 48 mmHg — ABNORMAL LOW (ref 80.0–100.0)

## 2013-03-12 LAB — POCT I-STAT 3, VENOUS BLOOD GAS (G3P V)
Acid-base deficit: 2 mmol/L (ref 0.0–2.0)
Acid-base deficit: 3 mmol/L — ABNORMAL HIGH (ref 0.0–2.0)
Bicarbonate: 22.3 mEq/L (ref 20.0–24.0)
Bicarbonate: 22.8 mEq/L (ref 20.0–24.0)
O2 Saturation: 47 %
O2 Saturation: 52 %
TCO2: 23 mmol/L (ref 0–100)
TCO2: 24 mmol/L (ref 0–100)
pCO2, Ven: 39 mmHg — ABNORMAL LOW (ref 45.0–50.0)
pCO2, Ven: 40.2 mmHg — ABNORMAL LOW (ref 45.0–50.0)
pH, Ven: 7.362 — ABNORMAL HIGH (ref 7.250–7.300)
pH, Ven: 7.364 — ABNORMAL HIGH (ref 7.250–7.300)
pO2, Ven: 27 mmHg — CL (ref 30.0–45.0)
pO2, Ven: 29 mmHg — CL (ref 30.0–45.0)

## 2013-03-12 LAB — PROTIME-INR
INR: 1.31 (ref 0.00–1.49)
Prothrombin Time: 16 seconds — ABNORMAL HIGH (ref 11.6–15.2)

## 2013-03-12 SURGERY — JV LEFT AND RIGHT HEART CATHETERIZATION WITH CORONARY ANGIOGRAM
Anesthesia: Moderate Sedation

## 2013-03-12 MED ORDER — LOSARTAN POTASSIUM 50 MG PO TABS
50.0000 mg | ORAL_TABLET | Freq: Every day | ORAL | Status: DC
  Administered 2013-03-13 – 2013-03-15 (×3): 50 mg via ORAL
  Filled 2013-03-12 (×3): qty 1

## 2013-03-12 MED ORDER — SODIUM CHLORIDE 0.9 % IV SOLN: 250.0000 mL | INTRAVENOUS | Status: DC | PRN

## 2013-03-12 MED ORDER — WARFARIN - PHARMACIST DOSING INPATIENT: Freq: Every day | Status: DC

## 2013-03-12 MED ORDER — POTASSIUM CHLORIDE CRYS ER 20 MEQ PO TBCR
20.0000 meq | EXTENDED_RELEASE_TABLET | Freq: Two times a day (BID) | ORAL | Status: DC
  Administered 2013-03-12 – 2013-03-15 (×6): 20 meq via ORAL
  Filled 2013-03-12 (×8): qty 1

## 2013-03-12 MED ORDER — VANCOMYCIN HCL IN DEXTROSE 1-5 GM/200ML-% IV SOLN
1000.0000 mg | Freq: Once | INTRAVENOUS | Status: AC
  Administered 2013-03-12: 1000 mg via INTRAVENOUS
  Filled 2013-03-12: qty 200

## 2013-03-12 MED ORDER — SODIUM CHLORIDE 0.9 % IJ SOLN
3.0000 mL | Freq: Two times a day (BID) | INTRAMUSCULAR | Status: DC
  Administered 2013-03-13: 3 mL via INTRAVENOUS

## 2013-03-12 MED ORDER — VITAMIN D3 25 MCG (1000 UNIT) PO TABS
1000.0000 [IU] | ORAL_TABLET | Freq: Every day | ORAL | Status: DC
  Administered 2013-03-13 – 2013-03-15 (×3): 1000 [IU] via ORAL
  Filled 2013-03-12 (×3): qty 1

## 2013-03-12 MED ORDER — PNEUMOCOCCAL VAC POLYVALENT 25 MCG/0.5ML IJ INJ
0.5000 mL | INJECTION | INTRAMUSCULAR | Status: AC
  Administered 2013-03-13: 0.5 mL via INTRAMUSCULAR
  Filled 2013-03-12: qty 0.5

## 2013-03-12 MED ORDER — WARFARIN SODIUM 4 MG PO TABS
4.0000 mg | ORAL_TABLET | Freq: Once | ORAL | Status: AC
  Administered 2013-03-12: 4 mg via ORAL
  Filled 2013-03-12: qty 1

## 2013-03-12 MED ORDER — FUROSEMIDE 40 MG PO TABS: 40.0000 mg | ORAL_TABLET | Freq: Every day | ORAL | Status: DC

## 2013-03-12 MED ORDER — ASPIRIN 81 MG PO CHEW: 324.0000 mg | CHEWABLE_TABLET | ORAL | Status: DC

## 2013-03-12 MED ORDER — FUROSEMIDE 80 MG PO TABS
80.0000 mg | ORAL_TABLET | Freq: Two times a day (BID) | ORAL | Status: DC
  Administered 2013-03-13 – 2013-03-15 (×5): 80 mg via ORAL
  Filled 2013-03-12 (×7): qty 1

## 2013-03-12 MED ORDER — FUROSEMIDE 80 MG PO TABS: 80.0000 mg | ORAL_TABLET | Freq: Every day | ORAL | Status: DC

## 2013-03-12 MED ORDER — ONDANSETRON HCL 4 MG/2ML IJ SOLN: 4.0000 mg | Freq: Four times a day (QID) | INTRAMUSCULAR | Status: DC | PRN

## 2013-03-12 MED ORDER — SODIUM CHLORIDE 0.9 % IJ SOLN: 3.0000 mL | INTRAMUSCULAR | Status: DC | PRN

## 2013-03-12 MED ORDER — VANCOMYCIN HCL IN DEXTROSE 750-5 MG/150ML-% IV SOLN
750.0000 mg | INTRAVENOUS | Status: DC
  Administered 2013-03-13 – 2013-03-14 (×2): 750 mg via INTRAVENOUS
  Filled 2013-03-12 (×3): qty 150

## 2013-03-12 MED ORDER — AMIODARONE HCL 200 MG PO TABS
200.0000 mg | ORAL_TABLET | Freq: Every day | ORAL | Status: DC
  Administered 2013-03-13 – 2013-03-15 (×3): 200 mg via ORAL
  Filled 2013-03-12 (×3): qty 1

## 2013-03-12 MED ORDER — MILRINONE IN DEXTROSE 20 MG/100ML IV SOLN
0.3750 ug/kg/min | INTRAVENOUS | Status: DC
  Administered 2013-03-12 – 2013-03-15 (×5): 0.375 ug/kg/min via INTRAVENOUS
  Filled 2013-03-12 (×7): qty 100

## 2013-03-12 MED ORDER — ACETAMINOPHEN 325 MG PO TABS: 650.0000 mg | ORAL_TABLET | ORAL | Status: DC | PRN

## 2013-03-12 MED ORDER — SILDENAFIL CITRATE 20 MG PO TABS
20.0000 mg | ORAL_TABLET | Freq: Three times a day (TID) | ORAL | Status: DC
  Administered 2013-03-13 – 2013-03-15 (×6): 20 mg via ORAL
  Filled 2013-03-12 (×11): qty 1

## 2013-03-12 MED ORDER — SODIUM CHLORIDE 0.9 % IV SOLN: INTRAVENOUS | Status: DC

## 2013-03-12 NOTE — Interval H&P Note (Signed)
History and Physical Interval Note:  03/12/2013 12:15 PM  Brianna Hanson  has presented today for surgery, with the diagnosis of Heart Failure  The various methods of treatment have been discussed with the patient and family. After consideration of risks, benefits and other options for treatment, the patient has consented to  Procedure(s): JV LEFT AND RIGHT HEART CATHETERIZATION WITH CORONARY ANGIOGRAM (N/A) as a surgical intervention .  [name] history has been reviewed, patient examined, no change in status, stable for surgery.  I have reviewed the patient's chart and labs.  Questions were answered to the patient's satisfaction.     Daniel [name]

## 2013-03-12 NOTE — OR Nursing (Signed)
Meal served 

## 2013-03-12 NOTE — H&P (Signed)
Advanced Heart Failure Team History and Physical Note   EP: Dr Ladona Ridgel  PCP: Dr Yetta Barre  Primary Cardiologist: Dr. Gala Romney  Reason for Consultation: Low output HF  Reason for Admission: Possible PICC line infection  HPI:    Brianna Hanson is a 78 year old female with a PMH of HF due to severe NICM (EF 15%) diagnosed in 1999, cardiac arrest 2010 -V fib S/P ICD, PAF (maintaing SR on amio) - on chronic coumadin and plasma cell disorder (Likely IgA MGUS) - followed by DR. Shadad.   Patient has been followed closely in the HF clinic and has been in the Pre-VAD process. Continues with NYHA IIIB. Denies edema, orthopnea or PND. She presented to the hospital today for a scheduled RHC and was found to have low output HF with elevated R-sided pressures.   Came to the outpatient cath lab today for repeat RHC on milrinone for VAD evaluation. She had low-output and milrinone increased to to 0.375 mcg. Noticed while in cath lab her PICC line had erythema and discharge around insertion site. Will admit for possible PICC infection.   RHC 03/12/13 RA = 9  RV = 65/13/14-15  PA = 58/27 (38)  PCW = 22  Fick cardiac output/index = 3.4/2.0  PVR = 4.6  FA sat = 83%  PA sat = 52%, 47%  Ao Pressure: 111/70 (87)  LV Pressure: 113/14/19  There was no signficant gradient across the aortic valve on pullback.  Denies PICC line pain, fevers, chills, SOB or orthopnea. + erythema around insertion site, no pus noticed.   Review of Systems: [y] = yes, [ ]  = no   General: Weight gain [ ] ; Weight loss [ ] ; Anorexia [ ] ; Fatigue [ ] ; Fever [ ] ; Chills [ ] ; Weakness [ ]   Cardiac: Chest pain/pressure [ ] ; Resting SOB [Y ]; Exertional SOB [Y ]; Orthopnea [ ] ; Pedal Edema [N ]; Palpitations [ ] ; Syncope [ ] ; Presyncope [ ] ; Paroxysmal nocturnal dyspnea[ ]   Pulmonary: Cough [ ] ; Wheezing[ ] ; Hemoptysis[ ] ; Sputum [ ] ; Snoring [ ]   GI: Vomiting[ ] ; Dysphagia[ ] ; Melena[ ] ; Hematochezia [ ] ; Heartburn[ ] ; Abdominal pain [ ] ;  Constipation [ ] ; Diarrhea [ ] ; BRBPR [ ]   GU: Hematuria[ ] ; Dysuria [ ] ; Nocturia[ ]   Vascular: Pain in legs with walking [ ] ; Pain in feet with lying flat [ ] ; Non-healing sores [ ] ; Stroke [ ] ; TIA [ ] ; Slurred speech [ ] ;  Neuro: Headaches[ ] ; Vertigo[ ] ; Seizures[ ] ; Paresthesias[ ] ;Blurred vision [ ] ; Diplopia [ ] ; Vision changes [ ]   Ortho/Skin: Arthritis [Y ]; Joint pain [ ] ; Muscle pain [ ] ; Joint swelling [ ] ; Back Pain [ ] ; Rash [ ]   Psych: Depression[ ] ; Anxiety[ ]   Heme: Bleeding problems [ ] ; Clotting disorders [ ] ; Anemia [ ]   Endocrine: Diabetes [ ] ; Thyroid dysfunction[ ]   Home Medications Prior to Admission medications   Medication Sig Start Date End Date Taking? Authorizing Provider  amiodarone (PACERONE) 200 MG tablet Take 1 tablet (200 mg total) by mouth daily. 08/25/12  Yes Hillis Range, MD  Cholecalciferol (VITAMIN D3) 1000 UNITS CAPS Take 1 tablet by mouth daily.     Yes Historical Provider, MD  furosemide (LASIX) 80 MG tablet Take 1 tablet (80 mg total) by mouth 2 (two) times daily. Take 80 mg in am and 40 mg in pm 03/04/13  Yes Amy D Clegg, NP  losartan (COZAAR) 50 MG tablet Take 1 tablet (50 mg  total) by mouth daily. 02/09/13  Yes [name] [name], MD  milrinone (PRIMACOR) 20 MG/100ML SOLN infusion Inject 19.175 mcg/min into the vein continuous. 03/04/13  Yes Amy D Clegg, NP  potassium chloride SA (K-DUR,KLOR-CON) 20 MEQ tablet Take 20 mEq by mouth 2 (two) times daily.   Yes Historical Provider, MD  warfarin (COUMADIN) 2.5 MG tablet Take 2.5 mg by mouth at bedtime.   Yes Historical Provider, MD    Past Medical History: Past Medical History  Diagnosis Date  . Cardiac arrest - ventricular fibrillation 12/10    with successful resucitation, S/p ICD  . ICD (implantable cardiac defibrillator) in place     she has received appropriate therapy for VF  . Atrial fibrillation or flutter   . Osteopenia   . HTN (hypertension)     moderate  . Nonischemic cardiomyopathy      followed by Dr Glori Luis at [facility]  . CHF (congestive heart failure)   . Anemia   . S/P colonoscopy   . Plasma cell disorder 03/20/2012  . Plasma cell disorder 03/20/2012    Past Surgical History: Past Surgical History  Procedure Laterality Date  . Abdominal hysterectomy    . Cardiac catheterization    . Cardiac defibrillator placement      by JA for secondary prevention of sudden death    Family History: Family History  Problem Relation Age of Onset  . Stroke Brother   . Stroke Mother     Social History: History   Social History  . Marital Status: Single    Spouse Name: N/A    Number of Children: 3  . Years of Education: N/A   Social History Main Topics  . Smoking status: Former Games developer  . Smokeless tobacco: Never Used     Comment: remote history of tobacco abuse  . Alcohol Use: No  . Drug Use: No  . Sexually Active: Not Currently    Birth Control/ Protection: Surgical   Other Topics Concern  . Not on file   Social History Narrative   Lives with spouse who was recently diagnosed with esophageal ca and is receiving chemotherapy    Allergies:  No Known Allergies  Objective:    Vital Signs:   Temp:  [97.5 F (36.4 C)] 97.5 F (36.4 C) (07/11 1550) Pulse Rate:  [68-71] 68 (07/11 1550) Resp:  [16] 16 (07/11 1550) BP: (123-137)/(70-99) 137/90 mmHg (07/11 1550) SpO2:  [91 %-100 %] 91 % (07/11 1550) Weight:  [73.8 kg (162 lb 11.2 oz)-75.751 kg (167 lb)] 73.8 kg (162 lb 11.2 oz) (07/11 1552)   Filed Weights   03/12/13 1143  Weight: 75.751 kg (167 lb)    Physical Exam: General: Chronically ill appearing. No resp difficulty  HEENT: normal  Neck: supple. JVP 10. Carotids 2+ bilat; no bruits. No lymphadenopathy or thryomegaly appreciated.  Cor: PMI nondisplaced. Regular rate & rhythm. +s3  Lungs: clear  Abdomen: soft, nontender, nondistended. No hepatosplenomegaly. No bruits or masses. Good bowel sounds.  Extremities: no cyanosis, clubbing, rash, edema   Neuro: alert & orientedx3, cranial nerves grossly intact. moves all 4 extremities w/o difficulty. Affect pleasant  Labs: Basic Metabolic Panel:  Recent Labs Lab 03/11/13 0916  NA 137  K 3.3*  CL 108  CO2 24  GLUCOSE 94  BUN 14  CREATININE 1.3*  CALCIUM 8.8    Liver Function Tests: No results found for this basename: AST, ALT, ALKPHOS, BILITOT, PROT, ALBUMIN,  in the last 168 hours No results found for  this basename: LIPASE, AMYLASE,  in the last 168 hours No results found for this basename: AMMONIA,  in the last 168 hours  CBC:  Recent Labs Lab 03/11/13 0916  WBC 6.2  HGB 14.0  HCT 42.2  MCV 94.3  PLT 178.0    Cardiac Enzymes: No results found for this basename: CKTOTAL, CKMB, CKMBINDEX, TROPONINI,  in the last 168 hours  BNP: BNP (last 3 results)  Recent Labs  01/13/13 1639 03/03/13 1429  PROBNP 1614.0* 4435.0*    CBG: No results found for this basename: GLUCAP,  in the last 168 hours  Coagulation Studies:  Recent Labs  03/11/13 0916  LABPROT 15.2*  INR 1.4*    Other results:  Imaging: No results found.      Assessment:   1) Possible PICC line infection 2) Chronic systolic HF with biventricular failure and low output physiology 3) NICM      EF 15% Mild to moderate RV dysfunction 4) PAF, on coumadin   Plan/Discussion:    Will admit Brianna Hanson for possible PICC line infection. Get blood cultures one from PICC and then one from peripheral. D/C PICC line. Start prophylactic Vancomycin x48hrs. If blood cultures are negative then can discontinue Vancomycin and patient to go to IR to have tunneled catheter in  placed for home inotropes.  Length of Stay: 0 Brianna Hanson 03/12/2013, 4:21 PM  Advanced Heart Failure Team Pager [phone] (M-F; 7a - 4p)  Please contact [facility] for night-coverage after hours (4p -7a ) and weekends on [email or web address]

## 2013-03-12 NOTE — OR Nursing (Signed)
Report to [place], [place] on 2000

## 2013-03-12 NOTE — H&P (View-Only) (Signed)
Advanced Heart Failure Rounding Note   Subjective:     I/Os + but weight down 2 pounds. Feels much better on milrinone. Co-ox much improved.   Cardiac output/index now 3.8/2.1   Objective:   Weight Range:  Vital Signs:   Temp:  [97.4 F (36.3 C)-98.5 F (36.9 C)] 97.9 F (36.6 C) (07/03 0728) Pulse Rate:  [60] 60 (07/02 0934) Resp:  [16-20] 18 (07/03 0728) BP: (96-128)/(46-88) 122/64 mmHg (07/03 0700) SpO2:  [90 %-95 %] 93 % (07/03 0728) Weight:  [76.2 kg (167 lb 15.9 oz)-76.658 kg (169 lb)] 76.2 kg (167 lb 15.9 oz) (07/03 0431)    Weight change: Filed Weights   03/03/13 1157 03/04/13 0431  Weight: 76.658 kg (169 lb) 76.2 kg (167 lb 15.9 oz)    Intake/Output:   Intake/Output Summary (Last 24 hours) at 03/04/13 0756 Last data filed at 03/04/13 0700  Gross per 24 hour  Intake 617.18 ml  Output      0 ml  Net 617.18 ml     JVP measured personally 9-10  Physical Exam:  General: Chronically ill appearing. No resp difficulty  HEENT: normal  Neck: supple. JVP 10. Carotids 2+ bilat; no bruits. No lymphadenopathy or thryomegaly appreciated.  Cor: PMI nondisplaced. Regular rate & rhythm. +s3  Lungs: clear  Abdomen: soft, nontender, nondistended. No hepatosplenomegaly. No bruits or masses. Good bowel sounds.  Extremities: no cyanosis, clubbing, rash, edema  Neuro: alert & orientedx3, cranial nerves grossly intact. moves all 4 extremities w/o difficulty. Affect pleasant   Telemetry: AV paced 60  Labs: Basic Metabolic Panel:  Recent Labs Lab 03/03/13 1429 03/04/13 0445  NA 142 139  K 3.8 3.8  CL 107 106  CO2 26 23  GLUCOSE 93 103*  BUN 14 14  CREATININE 1.18* 1.14*  CALCIUM 8.9 8.5    Liver Function Tests: No results found for this basename: AST, ALT, ALKPHOS, BILITOT, PROT, ALBUMIN,  in the last 168 hours No results found for this basename: LIPASE, AMYLASE,  in the last 168 hours No results found for this basename: AMMONIA,  in the last 168  hours  CBC:  Recent Labs Lab 03/03/13 1429  WBC 4.5  NEUTROABS 1.7  HGB 12.8  HCT 38.9  MCV 93.5  PLT 169    Cardiac Enzymes: No results found for this basename: CKTOTAL, CKMB, CKMBINDEX, TROPONINI,  in the last 168 hours  BNP: BNP (last 3 results)  Recent Labs  01/13/13 1639 03/03/13 1429  PROBNP 1614.0* 4435.0*     Other results:   Imaging: Dg Chest Port 1 View  03/03/2013   *RADIOLOGY REPORT*  Clinical Data: Evaluate PICC line placement  PORTABLE CHEST - 1 VIEW  Comparison: 03/01/2013  Findings: There is a left chest wall ICD with lead in the right atrial appendage and right ventricle.  There is a right arm PICC line with tip in the SVC.  There is moderate cardiac enlargement. No pleural effusion or edema.  IMPRESSION:  1.  Cardiac enlargement.  No heart failure. 2.  Right arm PICC line tip is in the SVC.   Original Report Authenticated By: Taylor Stroud, M.D.      Medications:     Scheduled Medications: . amiodarone  200 mg Oral Daily  . losartan  50 mg Oral Daily  . sodium chloride  10-40 mL Intracatheter Q12H  . sodium chloride  3 mL Intravenous Q12H  . warfarin  2.5 mg Oral q1800  . Warfarin - Pharmacist Dosing Inpatient     Does not apply q1800     Infusions: . milrinone 0.25 mcg/kg/min (03/04/13 0427)     PRN Medications:  sodium chloride, acetaminophen, ondansetron (ZOFRAN) IV, sodium chloride, sodium chloride   Assessment:   1) Chronic systolic HF with biventricular failure and low output physiology  2) NICM,  EF 15%. Mild to moderate RV dysfunction  --s/p ICD  3) PAF   Plan/Discussion:     Much improved on milrinone. Will aim for d/c home today with HHRN and milrinone. Will need repeat echo anf  RHC (+/- cor angio) in 1-2 weeks to assess full response to milrinone and decide on VAD candidacy.  Will d/c later today if HHRN and home milrinone in place.  Desha Bitner,MD 8:05 AM  Length of Stay: 1 Advanced Heart Failure  Team Pager [phone] (M-F; 7a - 4p)  Please contact [facility] for night-coverage after hours (4p -7a ) and weekends on [email or web address]  

## 2013-03-12 NOTE — Consult Note (Signed)
ANTIBIOTIC/ANTICOAGULATION CONSULT NOTE - INITIAL  Pharmacy Consult for Coumadin Indication: Afib  No Known Allergies  Patient Measurements: Height: 5' (152.4 cm) Weight: 162 lb 11.2 oz (73.8 kg) IBW/kg (Calculated) : 45.5  Vital Signs: Temp: 97.5 F (36.4 C) (07/11 1550) Temp src: Oral (07/11 1550) BP: 137/90 mmHg (07/11 1550) Pulse Rate: 68 (07/11 1550) Intake/Output from previous day:   Intake/Output from this shift:    Labs:  Recent Labs  03/11/13 0916  WBC 6.2  HGB 14.0  PLT 178.0  CREATININE 1.3*   Estimated Creatinine Clearance: 33 ml/min (by C-G formula based on Cr of 1.3).  Microbiology: Recent Results (from the past 720 hour(s))  SURGICAL PCR SCREEN     Status: None   Collection Time    03/03/13 11:36 AM      Result Value Range Status   MRSA, PCR NEGATIVE  NEGATIVE Final   Staphylococcus aureus NEGATIVE  NEGATIVE Final   Comment:            The Xpert SA Assay (FDA     approved for NASAL specimens     in patients over 11 years of age),     is one component of     a comprehensive surveillance     program.  Test performance has     been validated by [employer] for patients greater     than or equal to 1 year old.     It is not intended     to diagnose infection nor to     guide or monitor treatment.    Medical History: Past Medical History  Diagnosis Date  . Cardiac arrest - ventricular fibrillation 12/10    with successful resucitation, S/p ICD  . ICD (implantable cardiac defibrillator) in place     she has received appropriate therapy for VF  . Atrial fibrillation or flutter   . Osteopenia   . HTN (hypertension)     moderate  . Nonischemic cardiomyopathy     followed by Dr Glori Luis at [facility]  . CHF (congestive heart failure)   . Anemia   . S/P colonoscopy   . Plasma cell disorder 03/20/2012  . Plasma cell disorder 03/20/2012   Assessment: 78 yo F on coumadin pta for PAF.  INR today is 1.31 as expected after subtherapeutic INR  yesterday. Home dose is 2.5 mg daily and her last dose taken was 7/9.  Goal of Therapy:  INR 2-3  Plan:  Coumadin 4 mg PO x 1 dose tonight. Daily INR.  [employer], Pharm.D., BCPS Clinical Pharmacist Pager [phone] 03/12/2013 7:29 PM

## 2013-03-12 NOTE — Consult Note (Signed)
ANTIBIOTIC/ANTICOAGULATION CONSULT NOTE - INITIAL  Pharmacy Consult for Vancomycin + Coumadin Indication: PICC line infection + Afib  No Known Allergies  Patient Measurements: Height: 5' (152.4 cm) Weight: 167 lb (75.751 kg) IBW/kg (Calculated) : 45.5  Vital Signs: BP: 124/87 mmHg (07/11 1346) Pulse Rate: 71 (07/11 1318) Intake/Output from previous day:   Intake/Output from this shift:    Labs:  Recent Labs  03/11/13 0916  WBC 6.2  HGB 14.0  PLT 178.0  CREATININE 1.3*   Estimated Creatinine Clearance: 33.5 ml/min (by C-G formula based on Cr of 1.3).  Microbiology: Recent Results (from the past 720 hour(s))  SURGICAL PCR SCREEN     Status: None   Collection Time    03/03/13 11:36 AM      Result Value Range Status   MRSA, PCR NEGATIVE  NEGATIVE Final   Staphylococcus aureus NEGATIVE  NEGATIVE Final   Comment:            The Xpert SA Assay (FDA     approved for NASAL specimens     in patients over 11 years of age),     is one component of     a comprehensive surveillance     program.  Test performance has     been validated by [employer] for patients greater     than or equal to 72 year old.     It is not intended     to diagnose infection nor to     guide or monitor treatment.    Medical History: Past Medical History  Diagnosis Date  . Cardiac arrest - ventricular fibrillation 12/10    with successful resucitation, S/p ICD  . ICD (implantable cardiac defibrillator) in place     she has received appropriate therapy for VF  . Atrial fibrillation or flutter   . Osteopenia   . HTN (hypertension)     moderate  . Nonischemic cardiomyopathy     followed by Dr Glori Luis at [facility]  . CHF (congestive heart failure)   . Anemia   . S/P colonoscopy   . Plasma cell disorder 03/20/2012  . Plasma cell disorder 03/20/2012   Assessment: 78yof on home milrinone who had a RHC today (outpatient). Was noted to have redness and swelling at her right PICC site. She  will be admitted for IV vancomycin. Her PICC will be removed and blood cultures drawn. Renal insufficiency noted with sCr 1.3 and CrCl 33.75ml/min.  Also on coumadin pta for PAF which will be resumed here. INR for today has not been drawn, but was subtherapeutic at 1.4 yesterday. Home dose is 2.5mg  daily and her last dose taken was 7/9.  Goal of Therapy:  Vancomycin trough level 15-20 mcg/ml INR 2-3  Plan:  1) Vancomycin 1000mg  IV x 1 then 750mg  IV q24 2) Follow up INR for coumadin dosing   Fredrik Rigger 03/12/2013,2:10 PM

## 2013-03-12 NOTE — Progress Notes (Signed)
Assess Rt. PICC line.  Redden at insertion site with drainage.  [place] knott palpated at side.  Dr. Gala Romney informed.

## 2013-03-12 NOTE — CV Procedure (Signed)
Cardiac Cath Procedure Note  Indication: HF/ pre-VAD  Procedures performed:  1) Right heart cathererization 2) Selective coronary angiography 3) Left heart catheterization   Description of procedure:     The risks and indication of the procedure were explained. Consent was signed and placed on the chart. An appropriate timeout was taken prior to the procedure. The right groin was prepped and draped in the routine sterile fashion and anesthetized with 1% local lidocaine.   A 5 FR arterial sheath was placed in the right femoral artery using a modified Seldinger technique. Standard catheters including a JL4, JR4 and angled pigtail were used. All catheter exchanges were made over a wire. A 7 FR venous sheath was placed in the right femoral vein using a modified Seldinger technique. A standard Swan-Ganz catheter was used for the procedure.   Complications:  None apparent  Total contrast: 25cc  Findings:  RA = 9 RV = 65/13/14-15 PA = 58/27 (38) PCW = 22 Fick cardiac output/index = 3.4/2.0 PVR = 4.6 FA sat = 83% PA sat = 52%, 47%  Ao Pressure: 111/70 (87) LV Pressure:  113/14/19 There was no signficant gradient across the aortic valve on pullback.  Left main: Normal  LAD: Normal  LCX: Normal   RCA: Dominant. normal   Assessment: 1. Normal coronaries 2. Severe biventricular dysfunction.  3. Moderate PAH. RV function still remains marginal  Plan/Discussion:  Will increase milrinone to 0.375 and add revatio 20 tid.   Arvilla Meres, MD 1:00 PM

## 2013-03-13 DIAGNOSIS — I5022 Chronic systolic (congestive) heart failure: Secondary | ICD-10-CM | POA: Diagnosis not present

## 2013-03-13 DIAGNOSIS — T80218A Other infection due to central venous catheter, initial encounter: Secondary | ICD-10-CM | POA: Diagnosis not present

## 2013-03-13 LAB — CBC
HCT: 37.3 % (ref 36.0–46.0)
Hemoglobin: 12.4 g/dL (ref 12.0–15.0)
MCH: 30.8 pg (ref 26.0–34.0)
MCHC: 33.2 g/dL (ref 30.0–36.0)
MCV: 92.6 fL (ref 78.0–100.0)
Platelets: 167 10*3/uL (ref 150–400)
RBC: 4.03 MIL/uL (ref 3.87–5.11)
RDW: 17.4 % — ABNORMAL HIGH (ref 11.5–15.5)
WBC: 4.8 10*3/uL (ref 4.0–10.5)

## 2013-03-13 LAB — PROTIME-INR
INR: 1.34 (ref 0.00–1.49)
Prothrombin Time: 16.3 seconds — ABNORMAL HIGH (ref 11.6–15.2)

## 2013-03-13 LAB — BASIC METABOLIC PANEL
BUN: 13 mg/dL (ref 6–23)
CO2: 21 mEq/L (ref 19–32)
Calcium: 8.7 mg/dL (ref 8.4–10.5)
Chloride: 109 mEq/L (ref 96–112)
Creatinine, Ser: 1.04 mg/dL (ref 0.50–1.10)
GFR calc Af Amer: 59 mL/min — ABNORMAL LOW (ref >90)
GFR calc non Af Amer: 51 mL/min — ABNORMAL LOW (ref >90)
Glucose, Bld: 93 mg/dL (ref 70–99)
Potassium: 4.2 mEq/L (ref 3.5–5.1)
Sodium: 138 mEq/L (ref 135–145)

## 2013-03-13 MED ORDER — WARFARIN SODIUM 5 MG PO TABS
5.0000 mg | ORAL_TABLET | Freq: Once | ORAL | Status: AC
  Administered 2013-03-13: 5 mg via ORAL
  Filled 2013-03-13: qty 1

## 2013-03-13 NOTE — Progress Notes (Signed)
Advanced Heart Failure Team History and Physical Note   EP: Dr Ladona Ridgel  PCP: Dr Yetta Barre  Primary Cardiologist: Dr. Gala Romney  Reason for Consultation: Low output HF  Reason for Admission: Possible PICC line infection  HPI:    Brianna Hanson is a 78 year old female with a PMH of HF due to severe NICM (EF 15%) diagnosed in 1999, cardiac arrest 2010 -V fib S/P ICD, PAF (maintaing SR on amio) - on chronic coumadin and plasma cell disorder (Likely IgA MGUS) - followed by DR. Shadad.   Patient has been followed closely in the HF clinic and has been in the Pre-VAD process. Continues with NYHA IIIB. Denies edema, orthopnea or PND. She presented to the hospital today for a scheduled RHC and was found to have low output HF with elevated R-sided pressures.   Came to the outpatient cath lab yesterday for repeat RHC on milrinone for VAD evaluation. She had  ongoing low-output and PAH. Milrinone increased to to 0.375 mcg and Revatio added. Noticed while in cath lab her PICC line had erythema and discharge around insertion site. Admitted for possible PICC infection.   Remains AF. No leukocytosis. PICC out. Feels better. BCX results pending. On empiric vanc   Allergies:  No Known Allergies  Objective:    Vital Signs:   Temp:  [97.5 F (36.4 C)-98.1 F (36.7 C)] 98.1 F (36.7 C) (07/12 0500) Pulse Rate:  [64-88] 64 (07/12 0500) Resp:  [16] 16 (07/12 0500) BP: (121-137)/(70-99) 132/78 mmHg (07/12 0500) SpO2:  [91 %-100 %] 91 % (07/12 0500) Weight:  [73.8 kg (162 lb 11.2 oz)-75.841 kg (167 lb 3.2 oz)] 75.841 kg (167 lb 3.2 oz) (07/12 0500) Last BM Date: 03/12/13 Filed Weights   03/12/13 1552 03/13/13 0500  Weight: 73.8 kg (162 lb 11.2 oz) 75.841 kg (167 lb 3.2 oz)    Physical Exam: General: NAD. No resp difficulty  HEENT: normal  Neck: supple. JVP 8 Carotids 2+ bilat; no bruits. No lymphadenopathy or thryomegaly appreciated.  Cor: PMI nondisplaced. Regular rate & rhythm. +s3  Lungs: clear   Abdomen: soft, nontender, nondistended. No hepatosplenomegaly. No bruits or masses. Good bowel sounds.  Extremities: no cyanosis, clubbing, rash, edema  Neuro: alert & orientedx3, cranial nerves grossly intact. moves all 4 extremities w/o difficulty. Affect pleasant  Labs: Basic Metabolic Panel:  Recent Labs Lab 03/11/13 0916 03/13/13 0428  NA 137 138  K 3.3* 4.2  CL 108 109  CO2 24 21  GLUCOSE 94 93  BUN 14 13  CREATININE 1.3* 1.04  CALCIUM 8.8 8.7    Liver Function Tests: No results found for this basename: AST, ALT, ALKPHOS, BILITOT, PROT, ALBUMIN,  in the last 168 hours No results found for this basename: LIPASE, AMYLASE,  in the last 168 hours No results found for this basename: AMMONIA,  in the last 168 hours  CBC:  Recent Labs Lab 03/11/13 0916 03/12/13 2323  WBC 6.2 4.8  HGB 14.0 12.4  HCT 42.2 37.3  MCV 94.3 92.6  PLT 178.0 167    Cardiac Enzymes: No results found for this basename: CKTOTAL, CKMB, CKMBINDEX, TROPONINI,  in the last 168 hours  BNP: BNP (last 3 results)  Recent Labs  01/13/13 1639 03/03/13 1429  PROBNP 1614.0* 4435.0*    CBG: No results found for this basename: GLUCAP,  in the last 168 hours  Coagulation Studies:  Recent Labs  03/11/13 0916 03/12/13 1822 03/13/13 0428  LABPROT 15.2* 16.0* 16.3*  INR 1.4* 1.31 1.34  Other results:  Imaging: No results found.      Assessment:   1) Possible PICC line infection 2) Chronic systolic HF with biventricular failure and low output physiology 3) NICM      EF 15% Mild to moderate RV dysfunction 4) PAF, on coumadin 5) PAH   Plan/Discussion:    She is doing well. PICC out. No signs of systemic infection. Hopefully localized. If bcx negative at 48 hours (Monday am). Would stop vanc and have IR place tunneled PICC in R chest.  If BCX positive will need longer course of abx and TEE to exclude device infection (vs extraction).  Milrinone increased and revatio added.  Will need f/u RHC in 2 weeks to continue VAD evaluation. Resume coumadin. Adjust diuretics as needed   Length of Stay: 1 Brianna Hanson 03/13/2013, 7:08 AM  Advanced Heart Failure Team Pager [phone] (M-F; 7a - 4p)  Please contact [facility] for night-coverage after hours (4p -7a ) and weekends on [email or web address]

## 2013-03-13 NOTE — Progress Notes (Signed)
Brief Nutrition Note:   RD pulled to chart for malnutrition screening tool rpeort of "unsure" of unintentnional weight loss.  Wt Readings from Last 10 Encounters:  03/13/13 167 lb 3.2 oz (75.841 kg)  03/12/13 167 lb (75.751 kg)  03/12/13 167 lb (75.751 kg)  03/04/13 167 lb 15.9 oz (76.2 kg)  03/03/13 169 lb (76.658 kg)  03/03/13 169 lb (76.658 kg)  02/17/13 169 lb 12.8 oz (77.021 kg)  02/10/13 169 lb 8 oz (76.885 kg)  02/09/13 170 lb 1.9 oz (77.166 kg)  02/03/13 170 lb (77.111 kg)   Weight is down 3 lbs in the past month, not significant.  Body mass index is 32.65 kg/(m^2).  Obesity class 1 Diet: Heart Healthy- pt has not had a meal documented at this time.   Expect good oral intake. Chart reviewed, no nutrition interventions warranted at this time. Please consult as needed.   Clarene Duke RD, LDN Pager [phone] After Hours pager [phone]

## 2013-03-13 NOTE — Progress Notes (Signed)
ANTICOAGULATION CONSULT NOTE - Follow Up Consult  Pharmacy Consult for Coumadin Indication: atrial fibrillation  No Known Allergies  Patient Measurements: Height: 5' (152.4 cm) Weight: 167 lb 3.2 oz (75.841 kg) IBW/kg (Calculated) : 45.5 Heparin Dosing Weight:    Vital Signs: Temp: 98.1 F (36.7 C) (07/12 0500) Temp src: Oral (07/12 0500) BP: 132/78 mmHg (07/12 0500) Pulse Rate: 64 (07/12 0500)  Labs:  Recent Labs  03/11/13 0916 03/12/13 1822 03/12/13 2323 03/13/13 0428  HGB 14.0  --  12.4  --   HCT 42.2  --  37.3  --   PLT 178.0  --  167  --   LABPROT 15.2* 16.0*  --  16.3*  INR 1.4* 1.31  --  1.34  CREATININE 1.3*  --   --  1.04    Estimated Creatinine Clearance: 41.8 ml/min (by C-G formula based on Cr of 1.04).  Assessment: 78 y/o F on home milrinone who had a RHC today (outpatient). Was noted to have redness and swelling at her right PICC site.  Anticoagulation: Coumadin for PAF.Home dose is 2.5mg  daily with last dose 7/9. INR 1.34  Infectious Disease: PICC line infection (removed).Afebrile. WBC 4.8. 7/11: Vanco>> 7/11 BC x 2>>pending  Cardiovascular: 132/78, HR 64. Low output HF (EF 15%), ICD, PAF, patient in pre-VAD process in HF clinic.  Home milrinone now increased to 0.375 mcg/kg/min, po amiodarone, po Lais, losartan, K, Revatio.  Endocrinology: Glucose 93  Gastrointestinal / Nutrition: Vit D  Nephrology: Scr 1.04  Hematology / Oncology: plasma cell disorder (Likely IgA MGUS)  PTA Medication Issues  Goal of Therapy:  INR 2-3 Monitor platelets by anticoagulation protocol: Yes   Plan:  Coumadin 5mg  po x 1 tonight  [name], [employer] 03/13/2013,12:18 PM

## 2013-03-14 ENCOUNTER — Inpatient Hospital Stay (HOSPITAL_COMMUNITY): Payer: Medicare Other

## 2013-03-14 DIAGNOSIS — R059 Cough, unspecified: Secondary | ICD-10-CM | POA: Diagnosis not present

## 2013-03-14 DIAGNOSIS — T80218A Other infection due to central venous catheter, initial encounter: Secondary | ICD-10-CM | POA: Diagnosis not present

## 2013-03-14 DIAGNOSIS — R05 Cough: Secondary | ICD-10-CM | POA: Diagnosis not present

## 2013-03-14 DIAGNOSIS — I5022 Chronic systolic (congestive) heart failure: Secondary | ICD-10-CM | POA: Diagnosis not present

## 2013-03-14 DIAGNOSIS — I5023 Acute on chronic systolic (congestive) heart failure: Secondary | ICD-10-CM

## 2013-03-14 DIAGNOSIS — I4891 Unspecified atrial fibrillation: Secondary | ICD-10-CM

## 2013-03-14 LAB — BASIC METABOLIC PANEL
BUN: 9 mg/dL (ref 6–23)
CO2: 20 mEq/L (ref 19–32)
Calcium: 8.5 mg/dL (ref 8.4–10.5)
Chloride: 108 mEq/L (ref 96–112)
Creatinine, Ser: 1.05 mg/dL (ref 0.50–1.10)
GFR calc Af Amer: 58 mL/min — ABNORMAL LOW (ref >90)
GFR calc non Af Amer: 50 mL/min — ABNORMAL LOW (ref >90)
Glucose, Bld: 86 mg/dL (ref 70–99)
Potassium: 3.7 mEq/L (ref 3.5–5.1)
Sodium: 136 mEq/L (ref 135–145)

## 2013-03-14 LAB — PROTIME-INR
INR: 1.52 — ABNORMAL HIGH (ref 0.00–1.49)
Prothrombin Time: 17.9 seconds — ABNORMAL HIGH (ref 11.6–15.2)

## 2013-03-14 MED ORDER — WARFARIN SODIUM 5 MG PO TABS
5.0000 mg | ORAL_TABLET | Freq: Once | ORAL | Status: AC
  Administered 2013-03-14: 5 mg via ORAL
  Filled 2013-03-14: qty 1

## 2013-03-14 NOTE — Progress Notes (Signed)
ANTICOAGULATION CONSULT NOTE - Follow Up Consult  Pharmacy Consult for Coumadin Indication: atrial fibrillation  No Known Allergies  Patient Measurements: Height: 5' (152.4 cm) Weight: 166 lb 0.1 oz (75.3 kg) IBW/kg (Calculated) : 45.5 Heparin Dosing Weight:    Vital Signs: Temp: 97.8 F (36.6 C) (07/13 0700) Temp src: Oral (07/13 0700) BP: 118/77 mmHg (07/13 0700) Pulse Rate: 71 (07/13 0700)  Labs:  Recent Labs  03/12/13 1822 03/12/13 2323 03/13/13 0428 03/14/13 0425  HGB  --  12.4  --   --   HCT  --  37.3  --   --   PLT  --  167  --   --   LABPROT 16.0*  --  16.3* 17.9*  INR 1.31  --  1.34 1.52*  CREATININE  --   --  1.04 1.05    Estimated Creatinine Clearance: 41.3 ml/min (by C-G formula based on Cr of 1.05).  Assessment: 78 y/o F on home milrinone who had a RHC (outpatient). Was noted to have redness and swelling at her right PICC site.  Anticoagulation: Coumadin for PAF.Home dose is 2.5mg  daily with last dose 7/9. INR 1.52  Goal of Therapy:  INR 2-3 Monitor platelets by anticoagulation protocol: Yes   Plan:  Coumadin 5mg  po x 1 tonight again   [name], [employer] 03/14/2013,12:37 PM

## 2013-03-14 NOTE — Progress Notes (Signed)
SUBJECTIVE:No complaints of pain or dyspnea. She has some itching at site of PICC line.  Principal Problem:   CARDIOMYOPATHY, PRIMARY NEC Active Problems:   PICC line infection   Essential hypertension, benign   Acute on chronic systolic heart failure   Chronic renal insufficiency, stage II (mild)   LABS: Basic Metabolic Panel:  Recent Labs  96/04/54 0428 03/14/13 0425  NA 138 136  K 4.2 3.7  CL 109 108  CO2 21 20  GLUCOSE 93 86  BUN 13 9  CREATININE 1.04 1.05  CALCIUM 8.7 8.5   CBC:  Recent Labs  03/11/13 0916 03/12/13 2323  WBC 6.2 4.8  HGB 14.0 12.4  HCT 42.2 37.3  MCV 94.3 92.6  PLT 178.0 167    RADIOLOGY: No results found.   PHYSICAL EXAM BP 118/77  Pulse 71  Temp(Src) 97.8 F (36.6 C) (Oral)  Resp 18  Ht 5' (1.524 m)  Wt 166 lb 0.1 oz (75.3 kg)  BMI 32.42 kg/m2  SpO2 93% General: Well developed, well nourished, in no acute distress Head: Eyes PERRLA, No xanthomas.   Normal cephalic and atramatic  Lungs:  Clear in the apex, but crackles are noted in the right base. No wheezing, non-productive coughing with inspiration.  Heart: HRIR S1 S2, No MRG .  Pulses are 2+ & equal.            No carotid bruit. No JVD.  No abdominal bruits Abdomen: Bowel sounds are positive, abdomen soft and non-tender without masses or                  Hernia's noted. Msk:  Back normal,  Normal strength and tone for age. Extremities: No clubbing, cyanosis or edema. Dressing to upper right arm. No hematoma or edema surrounding dressing.  DP +1 Neuro: Alert and oriented X 3. Psych:  Good affect, responds appropriately  TELEMETRY: Reviewed telemetry pt in Atrial fib in the 70's.   ASSESSMENT AND PLAN:  1. Acute on chronic systolic CHF in the setting of NICM EF of (15%): No overt evidence of fluid overload, but has crackles in the bases. Will repeat CXR for evaluation of status. Continues on milrinone infusion, lasix and ARB. Recently started on Revatio on 03/13/2013.[name] She  is diuresing, (one liter overnight). Breathing is non-labored. Creatinine 1.05 this am.  2. PAF: Heart rate is controlled with Atrial fib on telemetry with PVC's. Continue on coumadin. Remains on amiodarone. INR this am 1.52.   3. PICC line infection: WBCs WNL per labs on 03/12/2013.Dressing intact without erythema or drainage. Awaiting BC results. Continues on abx.   4. ICD in situ. Blood cultures pending results. To discuss need to remove device if positive.    Bettey Mare. Lyman Bishop NP [facility] 03/14/2013, 8:30 AM  Attending Note:   The patient was seen and examined.  Agree with assessment and plan as noted above.  Changes made to the above note as needed.  [name] is in good spirits this am.  PICC line was infected.  Blood cultures are pending.  Vesta Mixer, Montez Hageman., MD, [facility] 03/14/2013, 10:05 AM

## 2013-03-14 NOTE — Discharge Summary (Signed)
Advanced Heart Failure Team  Discharge/Observation Summary   Patient ID: Brianna Hanson MRN: [id number], DOB/AGE: Jan 03, 1935 78 y.o. Admit date: 03/03/2013 D/C date:     03/04/2013   Primary Discharge Diagnoses:  1) Chronic systolic HF with biventricular failure and low output physiology  2) NICM,  EF 15%. Mild to moderate RV dysfunction  --s/p ICD  3) PAF   Hospital Course: Brianna Hanson is a 78 year old female with severe HF due to NICM (EF 15%) diagnosed in 1999, cardiac arrest 2010 -V fib S/P ICD, PAF (maintaing SR on amio) - on chronic coumadin and plasma cell disorder (Likely IgA MGUS).  She was followed by Dr Glori Luis at [facility] and was previously on transplant list. She is now followed in our HF Clinic.  ECHO 02/02/13: EF 15% RV mild to moderately HK.  CPX 02/02/13: Peak VO2 6.4 ml/kg/min; predicted peak 40.1% VE/CO2: 58.3; Peak RER: 1.13; OUES: 0.48   As part of her HF evaluation, she underwent scheduled outpatient RHC on March 03, 2013 which revealed biventricular HF with low output physiology (see below). She was brought in for 24-hour observation for PICC placement and initiation of IV milrinone for home inotropic support. Her b-blocker was stopped due to shock physiology and her diuretics were adjusted. [facility] was consulted to set up home inotrope therapy.  Rate remained controlled with therapeutic INR. She will continue to follow up at [facility]. She will continue to be followed closely in the HF clinic and with follow up March 10, 2013.     RHC 03/03/13  RA = 11  RV = 51/11/16  PA = 53/20 (32)  PCW = 10  Fick cardiac output/index = 2.6/1.5  Thermo CO/CI = 2.3/1.3  PVR (Fick) = 8.5 [place]  FA sat = 92%  PA sat = 45%, 51%       Discharge Weight Range: Discharge Weight 167 pounds.  Discharge Vitals: Blood pressure 121/76, pulse 63, temperature 97.9 F (36.6 C), temperature source Oral, resp. rate 19, height 5' (1.524 m), weight 167 lb 15.9 oz (76.2 kg), SpO2  92.00%.  Labs: Lab Results  Component Value Date   WBC 4.8 03/12/2013   HGB 12.4 03/12/2013   HCT 37.3 03/12/2013   MCV 92.6 03/12/2013   PLT 167 03/12/2013     Recent Labs Lab 03/14/13 0425  NA 136  K 3.7  CL 108  CO2 20  BUN 9  CREATININE 1.05  CALCIUM 8.5  GLUCOSE 86   Lab Results  Component Value Date   CHOL  Value: 130        ATP III CLASSIFICATION:  <200     mg/dL   Desirable  409-811  mg/dL   Borderline High  >=914    mg/dL   High        03/10/2955   HDL 36* 05/05/2010   LDLCALC  Value: 76        Total Cholesterol/HDL:CHD Risk Coronary Heart Disease Risk Table                     Men   Women  1/2 Average Risk   3.4   3.3  Average Risk       5.0   4.4  2 X Average Risk   9.6   7.1  3 X Average Risk  23.4   11.0        Use the calculated Patient Ratio above and the CHD Risk Table to determine the  patient's CHD Risk.        ATP III CLASSIFICATION (LDL):  <100     mg/dL   Optimal  161-096  mg/dL   Near or Above                    Optimal  130-159  mg/dL   Borderline  045-409  mg/dL   High  >811     mg/dL   Very High 05/03/4781   TRIG 90 05/05/2010   BNP (last 3 results)  Recent Labs  01/13/13 1639 03/03/13 1429  PROBNP 1614.0* 4435.0*    Diagnostic Studies/Procedures   Dg Chest 2 View  03/14/2013   *RADIOLOGY REPORT*  Clinical Data: Recent pneumonia.  Cough  CHEST - 2 VIEW  Comparison: 03/03/2013  Findings: Cardiac enlargement with mild vascular congestion and small effusions.  Negative for pulmonary edema.  AICD is unchanged in position.  No definite pneumonia is present.  IMPRESSION: Mild fluid overload.  No definite pneumonia.   Original Report Authenticated By: Janeece Riggers, M.D.    Discharge Medications     Medication List    STOP taking these medications       carvedilol 25 MG tablet  Commonly known as:  COREG      TAKE these medications       amiodarone 200 MG tablet  Commonly known as:  PACERONE  Take 1 tablet (200 mg total) by mouth daily.     furosemide  80 MG tablet  Commonly known as:  LASIX  Take 1 tablet (80 mg total) by mouth 2 (two) times daily. Take 80 mg in am and 40 mg in pm     losartan 50 MG tablet  Commonly known as:  COZAAR  Take 1 tablet (50 mg total) by mouth daily.     milrinone 20 MG/100ML Soln infusion  Commonly known as:  PRIMACOR  Inject 19.175 mcg/min into the vein continuous.     potassium chloride SA 20 MEQ tablet  Commonly known as:  K-DUR,KLOR-CON  Take 20 mEq by mouth 2 (two) times daily.     Vitamin D3 1000 UNITS Caps  Take 1 tablet by mouth daily.     warfarin 2.5 MG tablet  Commonly known as:  COUMADIN  Take 2.5 mg by mouth at bedtime.        Disposition   The patient will be discharged in stable condition to home. Discharge Orders   Future Appointments Provider Department Dept Phone   03/17/2013 4:00 PM Alleen Borne, MD [facility] [phone]   03/26/2013 9:00 AM [facility] [phone]   04/19/2013 11:00 AM [facility] [employer] Main [facility] [place]) [phone]   08/12/2013 9:00 AM Dava Najjar [facility] [phone]   08/12/2013 9:30 AM Benjiman Core, MD [facility] [phone]   Future Orders Complete By Expires     ACE Inhibitor / ARB already ordered  As directed     Diet - low sodium heart healthy  As directed     Heart Failure patients record your daily weight using the same scale at the same time of day  As directed     Increase activity slowly  As directed       Follow-up Information   Follow up with Arvilla Meres, MD On 03/10/2013. (12:20 Garage Code 1000)    Contact information:   [address]  [address] [place] [address] [phone]       Follow up with [facility].   Contact information:   [address] [place] [address] [phone]        Duration of  Discharge Encounter: Greater than 35 minutes   Signed, [name] 11:26 PM

## 2013-03-15 ENCOUNTER — Encounter (HOSPITAL_COMMUNITY): Payer: Self-pay | Admitting: Radiology

## 2013-03-15 ENCOUNTER — Inpatient Hospital Stay (HOSPITAL_COMMUNITY): Payer: Medicare Other

## 2013-03-15 DIAGNOSIS — I5022 Chronic systolic (congestive) heart failure: Secondary | ICD-10-CM | POA: Diagnosis not present

## 2013-03-15 DIAGNOSIS — Z5189 Encounter for other specified aftercare: Secondary | ICD-10-CM

## 2013-03-15 DIAGNOSIS — C439 Malignant melanoma of skin, unspecified: Secondary | ICD-10-CM | POA: Diagnosis not present

## 2013-03-15 LAB — BASIC METABOLIC PANEL
BUN: 9 mg/dL (ref 6–23)
CO2: 20 mEq/L (ref 19–32)
Calcium: 8.5 mg/dL (ref 8.4–10.5)
Chloride: 107 mEq/L (ref 96–112)
Creatinine, Ser: 0.95 mg/dL (ref 0.50–1.10)
GFR calc Af Amer: 66 mL/min — ABNORMAL LOW (ref >90)
GFR calc non Af Amer: 57 mL/min — ABNORMAL LOW (ref >90)
Glucose, Bld: 84 mg/dL (ref 70–99)
Potassium: 3.6 mEq/L (ref 3.5–5.1)
Sodium: 137 mEq/L (ref 135–145)

## 2013-03-15 LAB — PROTIME-INR
INR: 1.66 — ABNORMAL HIGH (ref 0.00–1.49)
Prothrombin Time: 19.1 seconds — ABNORMAL HIGH (ref 11.6–15.2)

## 2013-03-15 MED ORDER — SILDENAFIL CITRATE 20 MG PO TABS
40.0000 mg | ORAL_TABLET | Freq: Three times a day (TID) | ORAL | Status: DC
  Administered 2013-03-15 (×2): 40 mg via ORAL
  Filled 2013-03-15 (×3): qty 2

## 2013-03-15 MED ORDER — MIDAZOLAM HCL 2 MG/2ML IJ SOLN
INTRAMUSCULAR | Status: AC | PRN
  Administered 2013-03-15: 0.5 mg via INTRAVENOUS

## 2013-03-15 MED ORDER — SILDENAFIL CITRATE 20 MG PO TABS: 40.0000 mg | ORAL_TABLET | Freq: Three times a day (TID) | ORAL | Status: DC

## 2013-03-15 MED ORDER — FENTANYL CITRATE 0.05 MG/ML IJ SOLN
INTRAMUSCULAR | Status: AC | PRN
  Administered 2013-03-15 (×2): 25 ug via INTRAVENOUS

## 2013-03-15 MED ORDER — WARFARIN SODIUM 5 MG PO TABS
5.0000 mg | ORAL_TABLET | Freq: Once | ORAL | Status: AC
  Administered 2013-03-15: 5 mg via ORAL
  Filled 2013-03-15: qty 1

## 2013-03-15 MED ORDER — MILRINONE IN DEXTROSE 20 MG/100ML IV SOLN: 0.3750 ug/kg/min | INTRAVENOUS | Status: DC

## 2013-03-15 MED ORDER — CEFAZOLIN SODIUM 1-5 GM-% IV SOLN
1.0000 g | Freq: Three times a day (TID) | INTRAVENOUS | Status: DC
  Administered 2013-03-15: 1 g via INTRAVENOUS
  Filled 2013-03-15 (×3): qty 50

## 2013-03-15 MED ORDER — CEFAZOLIN SODIUM 1-5 GM-% IV SOLN
INTRAVENOUS | Status: AC
  Administered 2013-03-15: 1000 mg
  Filled 2013-03-15: qty 50

## 2013-03-15 MED ORDER — FUROSEMIDE 80 MG PO TABS: 80.0000 mg | ORAL_TABLET | Freq: Two times a day (BID) | ORAL | Status: DC

## 2013-03-15 MED ORDER — WARFARIN SODIUM 2.5 MG PO TABS: ORAL_TABLET | ORAL | Status: DC

## 2013-03-15 NOTE — H&P (Signed)
Chief Complaint: "I had a PICC that was infected, I need a new PICC." Referring Physician:Dr. [name] HPI: Brianna Hanson is an 78 y.o. female with PMHx of NICM on milrinone. Patient had previous PICC in place and present this admission with erythema and discharge surrounding PICC. PICC removed and BC pending with no growth, wbc WNL and afebrile. Patient is on empiric Vancomycin. She denies any fever or chills.    Past Medical History:  Past Medical History  Diagnosis Date  . Cardiac arrest - ventricular fibrillation 12/10    with successful resucitation, S/p ICD  . ICD (implantable cardiac defibrillator) in place     she has received appropriate therapy for VF  . Atrial fibrillation or flutter   . Osteopenia   . HTN (hypertension)     moderate  . Nonischemic cardiomyopathy     followed by Dr Glori Luis at [facility]  . CHF (congestive heart failure)   . Anemia   . S/P colonoscopy   . Plasma cell disorder 03/20/2012  . Plasma cell disorder 03/20/2012    Past Surgical History:  Past Surgical History  Procedure Laterality Date  . Abdominal hysterectomy    . Cardiac catheterization    . Cardiac defibrillator placement      by JA for secondary prevention of sudden death    Family History:  Family History  Problem Relation Age of Onset  . Stroke Brother   . Stroke Mother     Social History:  reports that she has quit smoking. She has never used smokeless tobacco. She reports that she does not drink alcohol or use illicit drugs.  Allergies: No Known Allergies  Medications:   Medication List    ASK your doctor about these medications       amiodarone 200 MG tablet  Commonly known as:  PACERONE  Take 1 tablet (200 mg total) by mouth daily.     furosemide 80 MG tablet  Commonly known as:  LASIX  Take 1 tablet (80 mg total) by mouth 2 (two) times daily. Take 80 mg in am and 40 mg in pm     losartan 50 MG tablet  Commonly known as:  COZAAR  Take 1 tablet (50 mg total) by  mouth daily.     milrinone 20 MG/100ML Soln infusion  Commonly known as:  PRIMACOR  Inject 19.175 mcg/min into the vein continuous.     potassium chloride SA 20 MEQ tablet  Commonly known as:  K-DUR,KLOR-CON  Take 20 mEq by mouth 2 (two) times daily.     Vitamin D3 1000 UNITS Caps  Take 1 tablet by mouth daily.     warfarin 2.5 MG tablet  Commonly known as:  COUMADIN  Take 2.5 mg by mouth at bedtime.        Please HPI for pertinent positives, otherwise complete 10 system ROS negative.  Physical Exam: BP 124/85  Pulse 82  Temp(Src) 98.3 F (36.8 C) (Oral)  Resp 20  Ht 5' (1.524 m)  Wt 163 lb 5.8 oz (74.1 kg)  BMI 31.9 kg/m2  SpO2 96% Body mass index is 31.9 kg/(m^2).   General Appearance:  Alert, cooperative, no distress, appears stated age  Head:  Normocephalic, without obvious abnormality, atraumatic  ENT: Unremarkable  Neck: Supple, symmetrical, trachea midline  Lungs:   Clear to auscultation bilaterally, no w/r/r, respirations unlabored without use of accessory muscles.  Chest Wall:  No tenderness or deformity  Heart:  Regularly irregular rate and rhythm, S1, S2  normal, no murmur, rub or gallop.  Abdomen:   Soft, non-tender, non distended.  Extremities: Extremities normal, atraumatic, no cyanosis or edema   Results for orders placed during the hospital encounter of 03/12/13 (from the past 48 hour(s))  PROTIME-INR     Status: Abnormal   Collection Time    03/14/13  4:25 AM      Result Value Range   Prothrombin Time 17.9 (*) 11.6 - 15.2 seconds   INR 1.52 (*) 0.00 - 1.49  BASIC METABOLIC PANEL     Status: Abnormal   Collection Time    03/14/13  4:25 AM      Result Value Range   Sodium 136  135 - 145 mEq/L   Potassium 3.7  3.5 - 5.1 mEq/L   Chloride 108  96 - 112 mEq/L   CO2 20  19 - 32 mEq/L   Glucose, Bld 86  70 - 99 mg/dL   BUN 9  6 - 23 mg/dL   Creatinine, Ser 5.78  0.50 - 1.10 mg/dL   Calcium 8.5  8.4 - 46.9 mg/dL   GFR calc non Af Amer 50 (*) >90  mL/min   GFR calc Af Amer 58 (*) >90 mL/min   Comment:            The eGFR has been calculated     using the CKD EPI equation.     This calculation has not been     validated in all clinical     situations.     eGFR's persistently     <90 mL/min signify     possible Chronic Kidney Disease.  PROTIME-INR     Status: Abnormal   Collection Time    03/15/13  5:30 AM      Result Value Range   Prothrombin Time 19.1 (*) 11.6 - 15.2 seconds   INR 1.66 (*) 0.00 - 1.49  BASIC METABOLIC PANEL     Status: Abnormal   Collection Time    03/15/13  5:30 AM      Result Value Range   Sodium 137  135 - 145 mEq/L   Potassium 3.6  3.5 - 5.1 mEq/L   Chloride 107  96 - 112 mEq/L   CO2 20  19 - 32 mEq/L   Glucose, Bld 84  70 - 99 mg/dL   BUN 9  6 - 23 mg/dL   Creatinine, Ser 6.29  0.50 - 1.10 mg/dL   Calcium 8.5  8.4 - 52.8 mg/dL   GFR calc non Af Amer 57 (*) >90 mL/min   GFR calc Af Amer 66 (*) >90 mL/min   Comment:            The eGFR has been calculated     using the CKD EPI equation.     This calculation has not been     validated in all clinical     situations.     eGFR's persistently     <90 mL/min signify     possible Chronic Kidney Disease.   Dg Chest 2 View  03/14/2013   *RADIOLOGY REPORT*  Clinical Data: Recent pneumonia.  Cough  CHEST - 2 VIEW  Comparison: 03/03/2013  Findings: Cardiac enlargement with mild vascular congestion and small effusions.  Negative for pulmonary edema.  AICD is unchanged in position.  No definite pneumonia is present.  IMPRESSION: Mild fluid overload.  No definite pneumonia.   Original Report Authenticated By: Janeece Riggers, M.D.    Assessment/Plan Patient with  PMHx NICM on milrinone. Previous PICC with erythema and discharge at insertion site. WBC WNL, patient afebrile and BC pending with no growth. Patient is on empiric Vancomycin. Will place tunneled PICC today, keep patient NPO. Risks and Benefits discussed with the patient. All of the patient's  questions were answered, patient is agreeable to proceed. Consent signed and in chart.   [name] D PA-C 03/15/2013, 10:42 AM

## 2013-03-15 NOTE — Care Management Note (Signed)
    Page 1 of 2   03/15/2013     4:06:24 PM   CARE MANAGEMENT NOTE 03/15/2013  Patient:  Brianna Hanson, Brianna Hanson   Account Number:  [id number]  Date Initiated:  03/15/2013  Documentation initiated by:  Charisse Wendell  Subjective/Objective Assessment:   PT ADM ON 03/12/13 WITH INFECTED PICC LINE.  PT ON HOME MILRINONE, ACTIVE WITH [facility] FOR HOME CARE.  PT INDEPENDENT PTA, LIVES WITH SPOUSE.     Action/Plan:   WILL FOLLOW FOR HOME NEEDS.  [facility] TO RESUME SERVICES AT DC.   Anticipated DC Date:  03/15/2013   Anticipated DC Plan:  HOME W HOME HEALTH SERVICES      DC Planning Services  CM consult      [facility] Choice  HOME HEALTH  Resumption Of Svcs/PTA Provider   Choice offered to / List presented to:  C-1 Patient   DME arranged  IV PUMP/EQUIPMENT      DME agency  [facility]     [facility] arranged  HH-1 RN      [facility] agency  [facility]   Status of service:  Completed, signed off Medicare Important Message given?   (If response is "NO", the following Medicare IM given date fields will be blank) Date Medicare IM given:   Date Additional Medicare IM given:    Discharge Disposition:  HOME W HOME HEALTH SERVICES  Per UR Regulation:  Reviewed for med. necessity/level of care/duration of stay  If discussed at Long Length of Stay Meetings, dates discussed:    Comments:  Rosalita Chessman [phone] ASKED TO CHECK COVERAGE FOR REVATIO:  REVATIO not covered under benefit plan but the generic brand is covered (which is sildenafil citrate) this would cost $7.00/31 day supply - please be aware that the only mgs would be 20 - she had no information for 40mg s.  Prior Berkley Harvey is required please call [phone].  PT FOR DC HOME TODAY; NEW LINE IN.  NOTIFIED [facility] OF DC. PAM WITH [facility] TO GET PT HOOKED BACK UP TO MILRINONE HOME INFUSION PUMP PRIOR TO DC.

## 2013-03-15 NOTE — H&P (Signed)
Agree with PA note.    Signed,  Heath K. McCullough, MD Vascular & Interventional Radiologist [facility]  

## 2013-03-15 NOTE — Discharge Summary (Signed)
Advanced Heart Failure Team  Discharge Summary   Patient ID: Brianna Hanson MRN: [id number], DOB/AGE: 1934-11-14 78 y.o. Admit date: 03/12/2013 D/C date:     03/15/2013   Primary Discharge Diagnoses:  1) Possible PICC line infection  Secondary Discharge Diagnoses:  1) Chronic systolic HF with biventricular failure and low output physiology  2) NICM         EF 15% Mild to moderate RV dysfunction  3) PAF, on coumadin  4) [facility]   Hospital Course:  Ms Boak is a 78 year old female with a PMH of HF due to severe NICM (EF 15%) diagnosed in 1999, cardiac arrest 2010 -V fib S/P ICD, PAF (maintaing SR on amio) - on chronic coumadin and plasma cell disorder (Likely IgA MGUS) - followed by DR. Shadad.   Patient has been followed closely in the HF clinic and has been in the Pre-VAD process. Continues with NYHA IIIB. Denies edema, orthopnea or PND. She presented to the hospital Friday for a scheduled RHC and was found to have low output HF with elevated R-sided pressures.   Presented to the cath lab on 03/12/13 for repeat RHC on milrinone for VAD evaluation. Her cath showed ongoing low-output and PAH, her milrinone was increased to 0.375 mcg and revatio was added. While in cath lab noticed PICC line had erythema and discharge around insertion site so she was admitted for possible PICC infection and prophylactic antibiotics.  Her PICC line was removed and she has completed a 48 hours course of antibiotics (vancomycin). The blood cultures have remained negative. She has not had any fevers, chills or leukocytosis. She underwent insertion of a tunneled midline PICC under fluoro today and milrinone was continued. She will be discharged with close follow-up in the HF clinic for continued LVAD evaluation.    Discharge Weight Range: 162-166 Discharge Vitals: Blood pressure 101/57, pulse 74, temperature 98.7 F (37.1 C), temperature source Oral, resp. rate 18, height 5' (1.524 m), weight 163 lb 5.8 oz (74.1 kg),  SpO2 96.00%.  Labs: Lab Results  Component Value Date   WBC 4.8 03/12/2013   HGB 12.4 03/12/2013   HCT 37.3 03/12/2013   MCV 92.6 03/12/2013   PLT 167 03/12/2013     Recent Labs Lab 03/15/13 0530  NA 137  K 3.6  CL 107  CO2 20  BUN 9  CREATININE 0.95  CALCIUM 8.5  GLUCOSE 84   Lab Results  Component Value Date   CHOL  Value: 130        ATP III CLASSIFICATION:  <200     mg/dL   Desirable  409-811  mg/dL   Borderline High  >=914    mg/dL   High        03/10/2955   HDL 36* 05/05/2010   LDLCALC  Value: 76        Total Cholesterol/HDL:CHD Risk Coronary Heart Disease Risk Table                     Men   Women  1/2 Average Risk   3.4   3.3  Average Risk       5.0   4.4  2 X Average Risk   9.6   7.1  3 X Average Risk  23.4   11.0        Use the calculated Patient Ratio above and the CHD Risk Table to determine the patient's CHD Risk.        ATP III  CLASSIFICATION (LDL):  <100     mg/dL   Optimal  960-454  mg/dL   Near or Above                    Optimal  130-159  mg/dL   Borderline  098-119  mg/dL   High  >147     mg/dL   Very High 04/03/9561   TRIG 90 05/05/2010   BNP (last 3 results)  Recent Labs  01/13/13 1639 03/03/13 1429  PROBNP 1614.0* 4435.0*    Diagnostic Studies/Procedures   Dg Chest 2 View  03/14/2013   *RADIOLOGY REPORT*  Clinical Data: Recent pneumonia.  Cough  CHEST - 2 VIEW  Comparison: 03/03/2013  Findings: Cardiac enlargement with mild vascular congestion and small effusions.  Negative for pulmonary edema.  AICD is unchanged in position.  No definite pneumonia is present.  IMPRESSION: Mild fluid overload.  No definite pneumonia.   Original Report Authenticated By: Janeece Riggers, M.D.    Discharge Medications     Medication List         amiodarone 200 MG tablet  Commonly known as:  PACERONE  Take 1 tablet (200 mg total) by mouth daily.     furosemide 80 MG tablet  Commonly known as:  LASIX  Take 1 tablet (80 mg total) by mouth 2 (two) times daily.     losartan 50  MG tablet  Commonly known as:  COZAAR  Take 1 tablet (50 mg total) by mouth daily.     milrinone 20 MG/100ML Soln infusion  Commonly known as:  PRIMACOR  Inject 28.7625 mcg/min into the vein continuous.     potassium chloride SA 20 MEQ tablet  Commonly known as:  K-DUR,KLOR-CON  Take 20 mEq by mouth 2 (two) times daily.     sildenafil 20 MG tablet  Commonly known as:  REVATIO  Take 2 tablets (40 mg total) by mouth 3 (three) times daily.     Vitamin D3 1000 UNITS Caps  Take 1 tablet by mouth daily.     warfarin 2.5 MG tablet  Commonly known as:  COUMADIN  Take 5 mg for 2 more days and then go back to 2.5 mg daily.        Disposition   The patient will be discharged in stable condition to home. Discharge Orders   Future Appointments Provider Department Dept Phone   03/17/2013 4:00 PM Alleen Borne, MD [facility] [phone]   03/18/2013 10:00 AM [facility] [phone]   03/29/2013 10:15 AM [facility] [phone]   04/19/2013 11:00 AM [facility] [employer] [facility] [place]) [phone]   08/12/2013 9:00 AM Dava Najjar [facility] [phone]   08/12/2013 9:30 AM Benjiman Core, MD [facility] [phone]   Future Orders Complete By Expires     Contraindication to ACEI at discharge  As directed     Diet - low sodium heart healthy  As directed     Heart Failure patients record your daily weight using the same scale at the same time of day  As directed     Increase activity slowly  As directed     STOP any activity that causes chest pain, shortness of breath, dizziness, sweating, or exessive weakness  As directed       Follow-up Information   Follow  up with Arvilla Meres, MD On 03/29/2013. (@ 10:15; Garage code 1000)    Contact  information:   [address] [place] [address] [phone]         Duration of Discharge Encounter: Greater than 35 minutes   Signed, Marca Ancona 03/22/2013 10:37 PM

## 2013-03-15 NOTE — Progress Notes (Signed)
Patient ID: Brianna Hanson, female   DOB: Oct 29, 1934, 78 y.o.   MRN: [id number] Advanced Heart Failure Team History and Physical Note   EP: Dr Ladona Ridgel  PCP: Dr Yetta Barre  Primary Cardiologist: Dr. Gala Romney  Reason for Consultation: Low output HF  Reason for Admission: Possible PICC line infection  HPI:    Ms Brianna Hanson is a 78 year old female with a PMH of HF due to severe NICM (EF 15%) diagnosed in 1999, cardiac arrest 2010 -V fib S/P ICD, PAF (maintaing SR on amio) - on chronic coumadin and plasma cell disorder (Likely IgA MGUS) - followed by DR. Shadad.   Patient has been followed closely in the HF clinic and has been in the Pre-VAD process. Continues with NYHA IIIB. Denies edema, orthopnea or PND. She presented to the hospital Friday for a scheduled RHC and was found to have low output HF with elevated R-sided pressures.   Came to the outpatient cath lab Friday for repeat RHC on milrinone for VAD evaluation. She had  ongoing low-output and PAH. Milrinone increased to to 0.375 mcg and Revatio added. Noticed while in cath lab her PICC line had erythema and discharge around insertion site. Admitted for possible PICC infection.   Remains AF. No leukocytosis. PICC out. Blood cultures so far negative. On empiric vanc.    Allergies:  No Known Allergies  Objective:    Vital Signs:   Temp:  [97.8 F (36.6 C)-98.3 F (36.8 C)] 98.3 F (36.8 C) (07/14 0510) Pulse Rate:  [66-89] 80 (07/14 0510) Resp:  [18-20] 20 (07/14 0510) BP: (106-120)/(50-86) 120/76 mmHg (07/14 0510) SpO2:  [96 %-97 %] 96 % (07/14 0510) Weight:  [163 lb 5.8 oz (74.1 kg)] 163 lb 5.8 oz (74.1 kg) (07/14 0510) Last BM Date: 03/13/13 Filed Weights   03/13/13 0500 03/14/13 0700 03/15/13 0510  Weight: 167 lb 3.2 oz (75.841 kg) 166 lb 0.1 oz (75.3 kg) 163 lb 5.8 oz (74.1 kg)    Physical Exam: General: NAD. No resp difficulty  HEENT: normal  Neck: supple. JVP 8 Carotids 2+ bilat; no bruits. No lymphadenopathy or thryomegaly  appreciated.  Cor: PMI nondisplaced. Regular rate & rhythm. +s3  Lungs: clear  Abdomen: soft, nontender, nondistended. No hepatosplenomegaly. No bruits or masses. Good bowel sounds.  Extremities: no cyanosis, clubbing, rash, edema  Neuro: alert & orientedx3, cranial nerves grossly intact. moves all 4 extremities w/o difficulty. Affect pleasant  Labs: Basic Metabolic Panel:  Recent Labs Lab 03/11/13 0916 03/13/13 0428 03/14/13 0425 03/15/13 0530  NA 137 138 136 137  K 3.3* 4.2 3.7 3.6  CL 108 109 108 107  CO2 24 21 20 20   GLUCOSE 94 93 86 84  BUN 14 13 9 9   CREATININE 1.3* 1.04 1.05 0.95  CALCIUM 8.8 8.7 8.5 8.5    Liver Function Tests: No results found for this basename: AST, ALT, ALKPHOS, BILITOT, PROT, ALBUMIN,  in the last 168 hours No results found for this basename: LIPASE, AMYLASE,  in the last 168 hours No results found for this basename: AMMONIA,  in the last 168 hours  CBC:  Recent Labs Lab 03/11/13 0916 03/12/13 2323  WBC 6.2 4.8  HGB 14.0 12.4  HCT 42.2 37.3  MCV 94.3 92.6  PLT 178.0 167    Cardiac Enzymes: No results found for this basename: CKTOTAL, CKMB, CKMBINDEX, TROPONINI,  in the last 168 hours  BNP: BNP (last 3 results)  Recent Labs  01/13/13 1639 03/03/13 1429  PROBNP 1614.0* 4435.0*  CBG: No results found for this basename: GLUCAP,  in the last 168 hours  Coagulation Studies:  Recent Labs  03/12/13 1822 03/13/13 0428 03/14/13 0425 03/15/13 0530  LABPROT 16.0* 16.3* 17.9* 19.1*  INR 1.31 1.34 1.52* 1.66*    Other results:  Imaging: Dg Chest 2 View  03/14/2013   *RADIOLOGY REPORT*  Clinical Data: Recent pneumonia.  Cough  CHEST - 2 VIEW  Comparison: 03/03/2013  Findings: Cardiac enlargement with mild vascular congestion and small effusions.  Negative for pulmonary edema.  AICD is unchanged in position.  No definite pneumonia is present.  IMPRESSION: Mild fluid overload.  No definite pneumonia.   Original Report  Authenticated By: Janeece Riggers, M.D.        Assessment:   1) Possible PICC line infection 2) Chronic systolic HF with biventricular failure and low output physiology 3) NICM      EF 15% Mild to moderate RV dysfunction 4) PAF, on coumadin 5) PAH   Plan/Discussion:    She is doing well. PICC out. No signs of systemic infection. Blood cultures so far negative, hopefully infection was localized. Today will stop vancomycin and have IR placed tunneled line in the right chest.  Milrinone increased and revatio added on Friday.  Will increase Revatio to 40 mg tid as she has tolerated well so far. Will need f/u RHC in 2 weeks to continue VAD evaluation. continue coumadin, INR 1.6. Continue current Lasix.   Length of Stay: 3 Marca Ancona 03/15/2013, 7:16 AM  Advanced Heart Failure Team Pager [phone] (M-F; 7a - 4p)  Please contact [facility] for night-coverage after hours (4p -7a ) and weekends on [email or web address]

## 2013-03-15 NOTE — Progress Notes (Signed)
ANTICOAGULATION, ANTIBIOTIC CONSULT NOTE - Follow Up Consult  Pharmacy Consult for Coumadin Indication: atrial fibrillation  No Known Allergies  Patient Measurements: Height: 5' (152.4 cm) Weight: 163 lb 5.8 oz (74.1 kg) IBW/kg (Calculated) : 45.5   Vital Signs: Temp: 98.3 F (36.8 C) (07/14 0510) Temp src: Oral (07/14 0510) BP: 120/76 mmHg (07/14 0510) Pulse Rate: 80 (07/14 0510)  Labs:  Recent Labs  03/12/13 2323 03/13/13 0428 03/14/13 0425 03/15/13 0530  HGB 12.4  --   --   --   HCT 37.3  --   --   --   PLT 167  --   --   --   LABPROT  --  16.3* 17.9* 19.1*  INR  --  1.34 1.52* 1.66*  CREATININE  --  1.04 1.05 0.95    Estimated Creatinine Clearance: 45.3 ml/min (by C-G formula based on Cr of 0.95).  Assessment: 78 y/o F on home milrinone who had a RHC (outpatient). Was noted to have redness and swelling at her right PICC site and admitted 03/12/2013. Pharmacy consulted to dose warfarin.  Events: Plan to replace PICC today  Anticoagulation:  PAF. Warfarin: Home dose is 2.5mg  daily.  INR remains sub-therapeutic.  Infectious Disease: PICC line infection (removed).Afebrile. WBC 4.8. 7/11: Vanco>> 7/11 BC x 2>>NGTD  Cardiovascular: Low output HF (EF 15%), ICD, PAF, patient in pre-VAD process in HF clinic. Home milrinone now increased to 0.375 mcg/kg/min, po amiodarone, po Lasix, losartan, K+ (20 id K 3.6), Revatio titrated to 40 tid.   SBP 100-120s, HR 60-80s  Nephrology: Scr 0.95, CrCl 45  Goal of Therapy:  INR 2-3 Monitor platelets by anticoagulation protocol: Yes   Plan:  Coumadin 5mg  po x 1 tonight again Continue vancomycin, follow up plan for length of therapy, plan to check trough if therapy to continue > 7d.   Thank you for allowing pharmacy to be a part of this patients care team.  Lovenia Kim Pharm.D., BCPS Clinical Pharmacist 03/15/2013 9:46 AM Pager: [phone] Phone: [phone]

## 2013-03-15 NOTE — Progress Notes (Signed)
Reviewed discharge instructions with pt and husband per [employer]back, questions answered. Brianna Hanson A

## 2013-03-15 NOTE — Procedures (Signed)
Interventional Radiology Procedure Note  Procedure: Placement tunneled right IJ dual lumen PowerPICC.  Tip at superior cavoatrial junction and ready for use. Complications: None Recommendations: - Routine line care - Retaining suture can be removed in 14 days  Signed,  Sterling Big, MD Vascular & Interventional Radiologist [facility] Radiology

## 2013-03-16 ENCOUNTER — Telehealth (HOSPITAL_COMMUNITY): Payer: Self-pay | Admitting: *Deleted

## 2013-03-16 ENCOUNTER — Encounter (HOSPITAL_COMMUNITY): Payer: Self-pay | Admitting: Internal Medicine

## 2013-03-16 DIAGNOSIS — I10 Essential (primary) hypertension: Secondary | ICD-10-CM | POA: Diagnosis not present

## 2013-03-16 DIAGNOSIS — Z7901 Long term (current) use of anticoagulants: Secondary | ICD-10-CM | POA: Diagnosis not present

## 2013-03-16 DIAGNOSIS — I5023 Acute on chronic systolic (congestive) heart failure: Secondary | ICD-10-CM | POA: Diagnosis not present

## 2013-03-16 DIAGNOSIS — Z452 Encounter for adjustment and management of vascular access device: Secondary | ICD-10-CM | POA: Diagnosis not present

## 2013-03-16 DIAGNOSIS — I509 Heart failure, unspecified: Secondary | ICD-10-CM | POA: Diagnosis not present

## 2013-03-16 DIAGNOSIS — I4891 Unspecified atrial fibrillation: Secondary | ICD-10-CM | POA: Diagnosis not present

## 2013-03-16 NOTE — Telephone Encounter (Signed)
Received form from [facility], pt needs prior auth for Sildenafil, 7/14 pm called [phone] (member ID #[id number]) was given case # [id number] and told form would be faxed for MD to complete, this AM had not received form, called them back and was on the phone for 40 min, medication was approved but also needed quantity approval (her coverage is only for 93 tabs for 30 day supply, pt is on 2 tabs TID and needs 180 tabs) finally was told this was approved however because the [facility] pharmacy had given pt some tabs last night it would have to be an override and that [facility] will need to call their pharmacy help desk for an override and she would put the info on file, [facility] pharmacy is aware and will call me back if they have any further problems.

## 2013-03-17 ENCOUNTER — Encounter: Payer: Self-pay | Admitting: Surgery

## 2013-03-17 ENCOUNTER — Institutional Professional Consult (permissible substitution) (INDEPENDENT_AMBULATORY_CARE_PROVIDER_SITE_OTHER): Payer: Medicare Other | Admitting: Surgery

## 2013-03-17 DIAGNOSIS — I509 Heart failure, unspecified: Secondary | ICD-10-CM | POA: Diagnosis not present

## 2013-03-17 DIAGNOSIS — I5023 Acute on chronic systolic (congestive) heart failure: Secondary | ICD-10-CM | POA: Diagnosis not present

## 2013-03-17 DIAGNOSIS — I5022 Chronic systolic (congestive) heart failure: Secondary | ICD-10-CM | POA: Diagnosis not present

## 2013-03-17 DIAGNOSIS — Z7901 Long term (current) use of anticoagulants: Secondary | ICD-10-CM | POA: Diagnosis not present

## 2013-03-17 DIAGNOSIS — Z452 Encounter for adjustment and management of vascular access device: Secondary | ICD-10-CM | POA: Diagnosis not present

## 2013-03-17 DIAGNOSIS — I4891 Unspecified atrial fibrillation: Secondary | ICD-10-CM | POA: Diagnosis not present

## 2013-03-17 DIAGNOSIS — I10 Essential (primary) hypertension: Secondary | ICD-10-CM | POA: Diagnosis not present

## 2013-03-18 DIAGNOSIS — Z7901 Long term (current) use of anticoagulants: Secondary | ICD-10-CM | POA: Diagnosis not present

## 2013-03-18 DIAGNOSIS — I509 Heart failure, unspecified: Secondary | ICD-10-CM | POA: Diagnosis not present

## 2013-03-18 DIAGNOSIS — I5023 Acute on chronic systolic (congestive) heart failure: Secondary | ICD-10-CM | POA: Diagnosis not present

## 2013-03-18 DIAGNOSIS — Z452 Encounter for adjustment and management of vascular access device: Secondary | ICD-10-CM | POA: Diagnosis not present

## 2013-03-18 DIAGNOSIS — I10 Essential (primary) hypertension: Secondary | ICD-10-CM | POA: Diagnosis not present

## 2013-03-18 DIAGNOSIS — I4891 Unspecified atrial fibrillation: Secondary | ICD-10-CM | POA: Diagnosis not present

## 2013-03-19 ENCOUNTER — Encounter: Payer: Self-pay | Admitting: Surgery

## 2013-03-19 LAB — CULTURE, BLOOD (ROUTINE X 2)
Culture: NO GROWTH
Culture: NO GROWTH
Culture: NO GROWTH

## 2013-03-19 NOTE — Progress Notes (Signed)
[address]       Brianna Hanson [address]             [phone]      CARDIOTHORACIC SURGERY CONSULTATION   PCP is Brianna Linger, MD Referring Provider is [name], [name], MD  Chief Complaint  Patient presents with  . Congestive Heart Failure    chronic...discuss LVAD placement    HPI:  The patient is a 78 year old female with a history of heart failure due to non-ischemic cardiomyopathy with an EF of 15% diagnosed in 1999. She had a cardiac arrest in 2010 due to V-fib and was successfully resusitated and had an ICD placed. She has had PAF but has maintained sinus rhythm on amiodarone. She has been followed in the heart failure clinic with NYHA IIIB symptoms. She had been followed at [facility] by Brianna Hanson and was previously on the transplant list. She decided to be followed at [facility] after Brianna Hanson left. She was scheduled for a RHC on 03/03/2013 and was found to have low output HF with elevated right heart pressures with PA = 53/20 (32), RV = 51/11/16, RA = 11, CI = 1.5, PVR = 8.5. She was started on Milrinone and LH/RH cath on 03/12/2013 showed normal coronaries with severe biventricular dysfunction with moderate PAH and marginal RV function. The Milrinone was increased to 0.375 and Revatio 20 tid was started. She was noted to have a PICC line infection and it was removed. She completed 48 hrs of antibiotics and blood cultures were negative. She had a new PICC placed. Since discharge she says she has felt better with no dyspnea, orthopnea or edema.  Past Medical History  Diagnosis Date  . Cardiac arrest - ventricular fibrillation 12/10    with successful resucitation, S/p ICD  . ICD (implantable cardiac defibrillator) in place     she has received appropriate therapy for VF  . Atrial fibrillation or flutter   . Osteopenia   . HTN (hypertension)     moderate  . Nonischemic cardiomyopathy     followed by Dr Glori Hanson at [facility]  . CHF (congestive heart failure)   .  Anemia   . S/P colonoscopy   . Plasma cell disorder 03/20/2012  . Plasma cell disorder 03/20/2012    Past Surgical History  Procedure Laterality Date  . Abdominal hysterectomy    . Cardiac catheterization    . Cardiac defibrillator placement      by JA for secondary prevention of sudden death    Family History  Problem Relation Age of Onset  . Stroke Brother   . Stroke Mother     Social History History  Substance Use Topics  . Smoking status: Former Games developer  . Smokeless tobacco: Never Used     Comment: remote history of tobacco abuse  . Alcohol Use: No  Lives with a significant other who will be her primary caregiver.  Current Outpatient Prescriptions  Medication Sig Dispense Refill  . amiodarone (PACERONE) 200 MG tablet Take 1 tablet (200 mg total) by mouth daily.  30 tablet  6  . Cholecalciferol (VITAMIN D3) 1000 UNITS CAPS Take 1 tablet by mouth daily.        . furosemide (LASIX) 80 MG tablet Take 1 tablet (80 mg total) by mouth 2 (two) times daily.  60 tablet  6  . losartan (COZAAR) 50 MG tablet Take 1 tablet (50 mg total) by mouth daily.  30 tablet  3  .  milrinone (PRIMACOR) 20 MG/100ML SOLN infusion Inject 28.7625 mcg/min into the vein continuous.  100 mL  6  . potassium chloride SA (K-DUR,KLOR-CON) 20 MEQ tablet Take 20 mEq by mouth 2 (two) times daily.      . sildenafil (REVATIO) 20 MG tablet Take 2 tablets (40 mg total) by mouth 3 (three) times daily.  180 tablet  3  . warfarin (COUMADIN) 2.5 MG tablet Take 5 mg for 2 more days and then go back to 2.5 mg daily.  60 tablet  3   No current facility-administered medications for this visit.    No Known Allergies  Review of Systems  Constitutional: Negative for fever, chills, fatigue and unexpected weight change.  HENT: Negative.   Eyes: Negative.   Respiratory: Negative for cough, chest tightness and shortness of breath.   Cardiovascular: Negative for chest pain, palpitations and leg swelling.  Gastrointestinal:  Negative.   Endocrine: Negative.   Genitourinary: Negative.   Musculoskeletal: Negative.   Skin: Negative.   Allergic/Immunologic: Negative.   Neurological: Negative.   Hematological: Negative.   Psychiatric/Behavioral: Negative.     Pulse 94  Resp 16  Ht 5' (1.524 m)  Wt 155 lb 8 oz (70.534 kg)  BMI 30.37 kg/m2  SpO2 94% Physical Exam  Constitutional: She is oriented to person, place, and time. She appears well-developed and well-nourished. No distress.  HENT:  Head: Normocephalic and atraumatic.  Mouth/Throat: Oropharynx is clear and moist.  Eyes: EOM are normal. Pupils are equal, round, and reactive to light.  Neck: Normal range of motion. Neck supple. No JVD present. No thyromegaly present.  Cardiovascular: Normal rate, regular rhythm, normal heart sounds and intact distal pulses.  Exam reveals no friction rub.   No murmur heard. Pulmonary/Chest: Effort normal and breath sounds normal. No respiratory distress. She has no wheezes. She has no rales.  Abdominal: Bowel sounds are normal. She exhibits no distension and no mass. There is no tenderness.  Musculoskeletal: Normal range of motion. She exhibits no edema.  Lymphadenopathy:    She has no cervical adenopathy.  Neurological: She is alert and oriented to person, place, and time. She has normal strength. No cranial nerve deficit or sensory deficit.  Skin: Skin is warm and dry.  Psychiatric: She has a normal mood and affect.     Diagnostic Tests:  Referred for: Heart Failure functional evaluation  Procedure: This patient underwent staged symptom-limited exercise treadmill testing using a modified Naughton protocol with expired gas analysis metabolic evaluation during exercise.  Demographics  Age: 5 Ht. (in.) 60 Wt. (lb) 169 BMI: 33.0 Predicted Peak VO2: 15.9 ml/kg/min  Gender: Female Ht (cm) 152.4 Wt. (kg) 76.7    Results  Pre-Exercise PFTs   FVC 1.48 (64%)   FEV1 1.16 (68%)   FEV1/FVC 78%   MVV 55  (73%)  Exercise Time: 4:00 Speed (mph): 1.5 Grade (%): 0.0    RPE: 15  Reason stopped: Patient stopped due to dyspnea (7/10) and dizziness (5/10)  Additional symptoms: None reported  Resting HR: 60 Peak HR: 74 % age predicted max HR: 51%  BP rest: 98/70 BP peak: 108/68  Peak VO2: 6.4 % predicted peak VO2: 40.1%  VE/VCO2 slope: 58.3  OUES: 0.48  Peak RER: 1.13  Ventilatory Threshold: Could not be visualized  Peak RR 35  Peak Ventilation: 30.9  VE/MVV: 56.4%  PETCO2 at peak: 22  O2pulse: 7 % predicted O2pulse: 78%   Interpretation  Notes: Patient gave a very good effort. The pulse-oximetry remained  at 96% or greater throughout the exercise.  ECG: Resting ECG in atrial paced rhythm, inferior-lateral T-wave inversion, and diffuse IVCD. There was an inadequate HR response to the exercise (beta-blockade considered) with occasional PVCs (primarily single PVCs, but a few couplets were observed). At the end of stage 1, there was a short run of RV pacing observed. The BP response was inappropriate.   PFT: Resting spirometry demonstrates a mild restrictive pattern. The MVV was mildly reduced.  CPX: Exercise testing with gas exchange demonstrates a severely reduced peak VO2 of 6.4 ml/kg/min (40% of the age/gender/weight matched sedentary norms). The RER of 1.13 indicates a maximal effort. When adjusted to the patient's ideal body weight of 124 lb (56.3 kg) the peak VO2 is 8.7 ml/kg (ibw)/min (45% of the ibw-adjusted predicted). The VE/VCO2 slope is grossly elevated indicating excessive dead space ventilation and extremely poor ventilatory efficiency. The oxygen uptake efficiency slope (OUES) is severely reduced and reflects the patient's measured functional capacity. The VO2 at the ventilatory threshold was could not be visualized. At peak exercise, the ventilation reached 56% of the measured MVV indicating ventilatory reserve remained (respiratory rate was at low  end of expected limits, Vt/IC [66%] was within expected limits). The PETCO2 was significantly reduced throughout the exercise, and has been demonstrated to be an independent risk factor for patients with heart failure. The O2pulse (a surrogate for stroke volume) increased throughout the exercise reaching 7 ml/beat (78% of predicted).   Conclusion: Exercise testing with gas exchange demonstrates a very severe functional limitation due to end-stage HF. Consideration should be given to advanced therapies if patient is a candidate.   [name]   *[place]* *[facility]* [address] [place], [place] [address] [phone]  ------------------------------------------------------------ Transthoracic Echocardiography  Patient: Brianna Hanson, Brianna Hanson MR #: [id number] Study Date: 02/02/2013 Gender: F Age: 49 Height: 152.4cm Weight: 77.3kg BSA: 1.75m^2 Pt. Status: Room:  PERFORMING [place], [facility] SONOGRAPHER Perley Jain, RDCS ORDERING Clegg, Amy ATTENDING [name], Daniel cc:  ------------------------------------------------------------ LV EF: 15%  ------------------------------------------------------------ Indications: CHF - 428.0.  ------------------------------------------------------------ History: Risk factors: Hypertension.  ------------------------------------------------------------ Study Conclusions  - Left ventricle: The cavity size was moderately dilated. Wall thickness was normal. The estimated ejection fraction was 15%. Diffuse hypokinesis. Doppler parameters are consistent with a restrictive pattern, indicative of decreased left ventricular diastolic compliance and/or increased left atrial pressure (grade 3 diastolic dysfunction). Doppler parameters are consistent with high ventricular filling pressure. - Mitral valve: Mild regurgitation. - Left atrium: The atrium was moderately dilated. - Right ventricle: The cavity size  was mildly to moderately dilated. Pacer wire or catheter noted in right ventricle. Systolic function was mildly to moderately reduced. - Right atrium: The atrium was mildly dilated. - Pulmonary arteries: PA peak pressure: 47mm Hg (S). Transthoracic echocardiography. M-mode, complete 2D, spectral Doppler, and color Doppler. Height: Height: 152.4cm. Height: 60in. Weight: Weight: 77.3kg. Weight: 170lb. Body mass index: BMI: 33.3kg/m^2. Body surface area: BSA: 1.46m^2. Blood pressure: 105/72. Patient status: Inpatient. Location: Echo laboratory.  ------------------------------------------------------------  ------------------------------------------------------------ Left ventricle: The cavity size was moderately dilated. Wall thickness was normal. The estimated ejection fraction was 15%. Diffuse hypokinesis. Doppler parameters are consistent with a restrictive pattern, indicative of decreased left ventricular diastolic compliance and/or increased left atrial pressure (grade 3 diastolic dysfunction). Doppler parameters are consistent with high ventricular filling pressure.  ------------------------------------------------------------ Aortic valve: Structurally normal valve. Cusp separation was normal. Doppler: Transvalvular velocity was within the normal range. There was no stenosis. No regurgitation.  ------------------------------------------------------------ Aorta: Aortic  root: The aortic root was normal in size.  ------------------------------------------------------------ Mitral valve: The valve appears to be grossly normal. Doppler: Mild regurgitation. Peak gradient: 5mm Hg (D).  ------------------------------------------------------------ Left atrium: The atrium was moderately dilated.  ------------------------------------------------------------ Right ventricle: The cavity size was mildly to moderately dilated. Pacer wire or catheter noted in right ventricle. Systolic  function was mildly to moderately reduced.  ------------------------------------------------------------ Pulmonic valve: The valve appears to be grossly normal. Doppler: No significant regurgitation.  ------------------------------------------------------------ Tricuspid valve: The valve appears to be grossly normal. Doppler: Mild regurgitation.  ------------------------------------------------------------ Right atrium: The atrium was mildly dilated.  ------------------------------------------------------------ Pericardium: There was no pericardial effusion.  ------------------------------------------------------------ Systemic veins: Inferior vena cava: The vessel was dilated; the respirophasic diameter changes were blunted (< 50%); findings are consistent with elevated central venous pressure.  ------------------------------------------------------------  2D measurements Normal Doppler Normal Left ventricle measurements LVID ED, 59.6 mm 43-52 Main pulmonary chord, artery PLAX Pressure, S 47 mm =30 LVID ES, 54 mm 23-38 Hg chord, Pressure ED 22 mm ------- PLAX Hg FS, chord, 9 % >29 Left ventricle PLAX Ea, lat 3.38 cm/ ------- LVPW, ED 8.43 mm ------ ann, tiss s IVS/LVPW 0.86 <1.3 DP ratio, ED E/Ea, lat 34.02 ------- Vol ED, 192 ml ------ ann, tiss MOD1 DP Vol ES, 152 ml ------ Ea, med 3.45 cm/ ------- MOD1 ann, tiss s EF, MOD1 21 % ------ DP Vol index, 104 ml/m^2 ------ E/Ea, med 33.33 ------- ED, MOD1 ann, tiss Vol index, 82 ml/m^2 ------ DP ES, MOD1 Mitral valve Vol ED, 217 ml ------ Peak E vel 115 cm/ ------- MOD2 s Vol ES, 175 ml ------ Peak A vel 49.4 cm/ ------- MOD2 s EF, MOD2 19 % ------ Deceleratio 148 ms 150-230 Stroke 42 ml ------ n time vol, MOD2 Peak 5 mm ------- Vol index, 118 ml/m^2 ------ gradient, D Hg ED, MOD2 Peak E/A 2.3 ------- Vol index, 95 ml/m^2 ------ ratio ES, MOD2 Regurg 38.5 cm/ ------- Stroke 22.8 ml/m^2 ------ alias vel,  s index, PISA MOD2 Max regurg 481 cm/ ------- Ventricular septum vel s IVS, ED 7.22 mm ------ Regurg VTI 188 cm ------- Aorta ERO, PISA 0.13 cm^ ------- Root diam, 27 mm ------ 2 ED Regurg vol, 24 ml ------- Left atrium PISA AP dim 47 mm ------ Tricuspid valve AP dim 2.55 cm/m^2 <2.2 Regurg peak 284 cm/ ------- index vel s Peak RV-RA 32 mm ------- gradient, S Hg Systemic veins Estimated 15 mm ------- CVP Hg Right ventricle Pressure, S 47 mm <30 Hg Sa vel, lat 5.51 cm/ ------- ann, tiss s DP Pulmonic valve Regurg vel, 131 cm/ ------- ED s  ------------------------------------------------------------ Prepared and Electronically Authenticated by  Willa Rough [id number]   RHC 03/03/2013  Pre-milrinone  RA = 11   RV = 51/11/16   PA = 53/20 (32)   PCW = 10   Fick cardiac output/index = 2.6/1.5   Thermo CO/CI = 2.3/1.3   PVR (Fick) = 8.5 Woods   FA sat = 92%   PA sat = 45%, 51%  RHC/LHC 03/15/2013  Milrinone 0.25  Findings:  RA = 9 RV = 65/13/14-15 PA = 58/27 (38) PCW = 22 Fick cardiac output/index = 3.4/2.0 PVR = 4.6 FA sat = 83% PA sat = 52%, 47%  Ao Pressure: 111/70 (87) LV Pressure:  113/14/19 There was no signficant gradient across the aortic valve on pullback.  Left main: Normal  LAD: Normal  LCX: Normal    RCA: Dominant. normal   Assessment: 1. Normal coronaries 2. Severe  biventricular dysfunction.   3. Moderate PAH. RV function still remains marginal   Impression:  She has severe NICM with EF 15% presenting earlier this month with low output HF and elevated RH pressures. She has had improvement on 0.375 mcg Milrinone and Revatio 40 tid. She will have a repeat RHC at the end of the month to decide if she is ready for LVAD placement. I think she is a good candidate for LVAD placement and seems motivated to remain active. She has a good support system. I discussed the Heartmate II system with her and her significant other. We  discussed the surgical procedure, postop recovery and expectations for recovery. We discussed the alternative of palliative medical therapy, benefits and risks of LVAD therapy including but not limited to bleeding, infection, stroke, right heart failure possibly requiring an RV support device, pump thrombosis or malfunction requiring replacement, organ dysfunction and death. She understands and agrees to proceed.   Plan:  She will have RHC at the end of the month and if RV function is adequate we will schedule LVAD placement with the Heartmate II.

## 2013-03-22 ENCOUNTER — Other Ambulatory Visit (HOSPITAL_COMMUNITY): Payer: Self-pay | Admitting: *Deleted

## 2013-03-22 DIAGNOSIS — Z452 Encounter for adjustment and management of vascular access device: Secondary | ICD-10-CM | POA: Diagnosis not present

## 2013-03-22 DIAGNOSIS — I10 Essential (primary) hypertension: Secondary | ICD-10-CM | POA: Diagnosis not present

## 2013-03-22 DIAGNOSIS — I5023 Acute on chronic systolic (congestive) heart failure: Secondary | ICD-10-CM | POA: Diagnosis not present

## 2013-03-22 DIAGNOSIS — I509 Heart failure, unspecified: Secondary | ICD-10-CM | POA: Diagnosis not present

## 2013-03-22 DIAGNOSIS — Z7901 Long term (current) use of anticoagulants: Secondary | ICD-10-CM | POA: Diagnosis not present

## 2013-03-22 DIAGNOSIS — I4891 Unspecified atrial fibrillation: Secondary | ICD-10-CM | POA: Diagnosis not present

## 2013-03-22 MED ORDER — POTASSIUM CHLORIDE CRYS ER 20 MEQ PO TBCR: 20.0000 meq | EXTENDED_RELEASE_TABLET | Freq: Two times a day (BID) | ORAL | Status: DC

## 2013-03-26 ENCOUNTER — Other Ambulatory Visit: Payer: Self-pay | Admitting: Internal Medicine

## 2013-03-26 ENCOUNTER — Ambulatory Visit (INDEPENDENT_AMBULATORY_CARE_PROVIDER_SITE_OTHER): Payer: Medicare Other | Admitting: General Practice

## 2013-03-26 DIAGNOSIS — I5023 Acute on chronic systolic (congestive) heart failure: Secondary | ICD-10-CM | POA: Diagnosis not present

## 2013-03-26 DIAGNOSIS — Z452 Encounter for adjustment and management of vascular access device: Secondary | ICD-10-CM | POA: Diagnosis not present

## 2013-03-26 DIAGNOSIS — I509 Heart failure, unspecified: Secondary | ICD-10-CM | POA: Diagnosis not present

## 2013-03-26 DIAGNOSIS — Z7901 Long term (current) use of anticoagulants: Secondary | ICD-10-CM

## 2013-03-26 DIAGNOSIS — I10 Essential (primary) hypertension: Secondary | ICD-10-CM | POA: Diagnosis not present

## 2013-03-26 DIAGNOSIS — I4891 Unspecified atrial fibrillation: Secondary | ICD-10-CM | POA: Diagnosis not present

## 2013-03-26 LAB — POCT INR: INR: 1.8

## 2013-03-29 ENCOUNTER — Encounter (HOSPITAL_COMMUNITY): Payer: Self-pay

## 2013-03-29 ENCOUNTER — Other Ambulatory Visit (HOSPITAL_COMMUNITY): Payer: Self-pay | Admitting: Adult Health

## 2013-03-29 ENCOUNTER — Encounter (HOSPITAL_COMMUNITY): Payer: Self-pay | Admitting: *Deleted

## 2013-03-29 ENCOUNTER — Ambulatory Visit (HOSPITAL_COMMUNITY)
Admit: 2013-03-29 | Discharge: 2013-03-29 | Disposition: A | Discharge status: Home / Self Care | Payer: Medicare Other | Source: Ambulatory Visit | Attending: Cardiology | Admitting: Cardiology

## 2013-03-29 VITALS — BP 110/64 | HR 72 | Wt 169.8 lb

## 2013-03-29 DIAGNOSIS — I5022 Chronic systolic (congestive) heart failure: Secondary | ICD-10-CM | POA: Diagnosis not present

## 2013-03-29 DIAGNOSIS — I50810 Right heart failure, unspecified: Secondary | ICD-10-CM

## 2013-03-29 DIAGNOSIS — I509 Heart failure, unspecified: Secondary | ICD-10-CM

## 2013-03-29 DIAGNOSIS — I2721 Secondary pulmonary arterial hypertension: Secondary | ICD-10-CM

## 2013-03-29 DIAGNOSIS — I5023 Acute on chronic systolic (congestive) heart failure: Secondary | ICD-10-CM | POA: Diagnosis not present

## 2013-03-29 DIAGNOSIS — I2789 Other specified pulmonary heart diseases: Secondary | ICD-10-CM

## 2013-03-29 DIAGNOSIS — I428 Other cardiomyopathies: Secondary | ICD-10-CM | POA: Diagnosis not present

## 2013-03-29 DIAGNOSIS — I4891 Unspecified atrial fibrillation: Secondary | ICD-10-CM

## 2013-03-29 DIAGNOSIS — Z452 Encounter for adjustment and management of vascular access device: Secondary | ICD-10-CM | POA: Diagnosis not present

## 2013-03-29 DIAGNOSIS — Z7901 Long term (current) use of anticoagulants: Secondary | ICD-10-CM | POA: Insufficient documentation

## 2013-03-29 DIAGNOSIS — I10 Essential (primary) hypertension: Secondary | ICD-10-CM | POA: Diagnosis not present

## 2013-03-29 NOTE — Progress Notes (Addendum)
Patient ID: Brianna Hanson, female   DOB: 05-26-1935, 77 y.o.   MRN: [id number]  EP: Dr Ladona Ridgel PCP: Dr Yetta Barre  Coumadin- [facility]  HPI: Brianna Hanson is a 78 year old female with a PMH of HF due to severe NICM (EF 15%) diagnosed in 1999, cardiac arrest 2010 -V fib S/P ICD,  PAF (maintaing SR on amio) - on chronic coumadin and plasma cell disorder (Likely IgA MGUS) - followed by DR. Shadad.   She was previously followed by Dr Glori Luis at [facility]. Referred to HF Clinic in 6/14.  ECHO 02/02/13 EF 15% RV mild to moderately HK.   CPX 02/02/13  Peak VO2: 6.4 ml/kg/min  predicted peak VO2: 40.1% VE/VCO2 slope: 58.3 OUES: 0.48 Peak RER: 1.13  Admitted after RHC 03/03/13 with initiation of Milrinone via PICC.  RHC 03/03/13  RA = 11  RV = 51/11/16  PA = 53/20 (32)  PCW = 10  Fick cardiac output/index = 2.6/1.5  Thermo CO/CI = 2.3/1.3  PVR (Fick) = 8.5 Woods  FA sat = 92%  PA sat = 45%, 51%  Underwent f/u RHC on 7/11 with marginal hemodynamics. Milrinone increased to 0.375 and Revatio added. Now at 40 tid. Readmitted post-cath due to PICC site infection. Bcx x2 negative. PICC pulled and Hickman cathter placed for Milrinone.   She returns for post hospital follow up. Overall she feel good. Can walk through store without stopping but at other times can be dyspneic at rest. Denies edema/PND/Orthopnea. Weigh at home 160 pounds.  Taking medicatons.     Past Medical History  Diagnosis Date  . Cardiac arrest - ventricular fibrillation 12/10    with successful resucitation, S/p ICD  . ICD (implantable cardiac defibrillator) in place     she has received appropriate therapy for VF  . Atrial fibrillation or flutter   . Osteopenia   . HTN (hypertension)     moderate  . Nonischemic cardiomyopathy     follow[facility] by Dr Glori Luis at [facility]  . CHF (congestive heart failure)   . Anemia   . S/P colonoscopy   . Plasma cell disorder 03/20/2012  . Plasma cell disorder 03/20/2012    Current Outpatient  Prescriptions  Medication Sig Dispense Refill  . amiodarone (PACERONE) 200 MG tablet Take 1 tablet (200 mg total) by mouth daily.  30 tablet  6  . Cholecalciferol (VITAMIN D3) 1000 UNITS CAPS Take 1 tablet by mouth daily.        . furosemide (LASIX) 80 MG tablet Take 1 tablet (80 mg total) by mouth 2 (two) times daily.  60 tablet  6  . losartan (COZAAR) 50 MG tablet Take 1 tablet (50 mg total) by mouth daily.  30 tablet  3  . milrinone (PRIMACOR) 20 MG/100ML SOLN infusion Inject 28.7625 mcg/min into the vein continuous.  100 mL  6  . potassium chloride SA (K-DUR,KLOR-CON) 20 MEQ tablet Take 1 tablet (20 mEq total) by mouth 2 (two) times daily.  60 tablet  3  . sildenafil (REVATIO) 20 MG tablet Take 2 tablets (40 mg total) by mouth 3 (three) times daily.  180 tablet  3  . warfarin (COUMADIN) 2.5 MG tablet Take 5 mg for 2 more days and then go back to 2.5 mg daily.  60 tablet  3   No current facility-administered medications for this encounter.     No Known Allergies  History   Social History  . Marital Status: Single    Spouse Name: N/A  Number of Children: 3  . Years of Education: N/A   Occupational History  . Not on file.   Social History Main Topics  . Smoking status: Former Games developer  . Smokeless tobacco: Never Used     Comment: remote history of tobacco abuse  . Alcohol Use: No  . Drug Use: No  . Sexually Active: Not Currently    Birth Control/ Protection: Surgical   Other Topics Concern  . Not on file   Social History Narrative   Lives with spouse who was recently diagnosed with esophageal ca and is receiving chemotherapy    Family History  Problem Relation Age of Onset  . Stroke Brother   . Stroke Mother     PHYSICAL EXAM: Filed Vitals:   03/29/13 1048  BP: 110/64  Pulse: 72   General:  Well appearing. No respiratory difficulty HEENT: normal Neck: supple. JVP 7. Carotids 2+ bilat; no bruits. No lymphadenopathy or thryomegaly appreciated. Hickman site  looks clean  Cor: PMI nondisplaced. Regular rate & rhythm.+ s3. Soft MR Lungs: clear Abdomen: soft, nontender, nondistended. No hepatosplenomegaly. No bruits or masses. Good bowel sounds. Extremities: no cyanosis, clubbing, rash, tr edema R> L Neuro: alert & oriented x 3, cranial nerves grossly intact. moves all 4 extremities w/o difficulty. Affect pleasant.   No results found for this or any previous visit (from the past 24 hour(s)). No results found.   ASSESSMENT  1) Chronic systolic HF with biventricular failure and low output physiology  2) NICM       EF 15% Mild to moderate RV dysfunction  3) RV failure 4) PAF, in NSR on amio. Anticoagulated on coumadin  5) PAH  PLAN/DISCUSSION  She seems to be doing better on increased dose of milrinone and sildenafil however remains NYHA IV. We had long talk about pros/cons of VAD placement. She is eager to proceed if she qualifies. Will schedule repeat RHC tomorrow to assess for response to higher dose of milrinone and addition of sildenafil especially with regard to RV failure. If RHC numbers qualify will expedite getting pre-VAD testing completed for possible VAD placement next week, schedule permitting.   Continue amio and coumadin for AF.   Nisreen Guise,MD 1:29 PM

## 2013-03-29 NOTE — Patient Instructions (Addendum)
The current medical regimen is effective;  continue present plan and medications.  Your physician has requested that you have a cardiac catheterization. Cardiac catheterization is used to diagnose and/or treat various heart conditions. Doctors may recommend this procedure for a number of different reasons. The most common reason is to evaluate chest pain. Chest pain can be a symptom of coronary artery disease (CAD), and cardiac catheterization can show whether plaque is narrowing or blocking your heart's arteries. This procedure is also used to evaluate the valves, as well as measure the blood flow and oxygen levels in different parts of your heart. For further information please visit [email or web address] Please follow instruction sheet, as given.  Follow up appointment will be made after your cath.

## 2013-03-30 ENCOUNTER — Encounter (HOSPITAL_BASED_OUTPATIENT_CLINIC_OR_DEPARTMENT_OTHER)
Admission: RE | Disposition: A | Discharge status: Home / Self Care | Payer: Self-pay | Source: Ambulatory Visit | Attending: Internal Medicine

## 2013-03-30 ENCOUNTER — Inpatient Hospital Stay (HOSPITAL_BASED_OUTPATIENT_CLINIC_OR_DEPARTMENT_OTHER)
Admission: RE | Admit: 2013-03-30 | Discharge: 2013-03-30 | Disposition: A | Discharge status: Home / Self Care | Payer: Medicare Other | Source: Ambulatory Visit | Attending: Internal Medicine | Admitting: Internal Medicine

## 2013-03-30 DIAGNOSIS — I509 Heart failure, unspecified: Secondary | ICD-10-CM | POA: Diagnosis not present

## 2013-03-30 DIAGNOSIS — Z79899 Other long term (current) drug therapy: Secondary | ICD-10-CM | POA: Insufficient documentation

## 2013-03-30 DIAGNOSIS — I4891 Unspecified atrial fibrillation: Secondary | ICD-10-CM | POA: Insufficient documentation

## 2013-03-30 DIAGNOSIS — Z95810 Presence of automatic (implantable) cardiac defibrillator: Secondary | ICD-10-CM | POA: Insufficient documentation

## 2013-03-30 DIAGNOSIS — Z7901 Long term (current) use of anticoagulants: Secondary | ICD-10-CM | POA: Insufficient documentation

## 2013-03-30 DIAGNOSIS — I2789 Other specified pulmonary heart diseases: Secondary | ICD-10-CM | POA: Insufficient documentation

## 2013-03-30 DIAGNOSIS — Z8674 Personal history of sudden cardiac arrest: Secondary | ICD-10-CM | POA: Insufficient documentation

## 2013-03-30 DIAGNOSIS — I428 Other cardiomyopathies: Secondary | ICD-10-CM | POA: Insufficient documentation

## 2013-03-30 DIAGNOSIS — I5022 Chronic systolic (congestive) heart failure: Secondary | ICD-10-CM

## 2013-03-30 LAB — POCT I-STAT 3, VENOUS BLOOD GAS (G3P V)
Acid-base deficit: 3 mmol/L — ABNORMAL HIGH (ref 0.0–2.0)
Acid-base deficit: 4 mmol/L — ABNORMAL HIGH (ref 0.0–2.0)
Acid-base deficit: 4 mmol/L — ABNORMAL HIGH (ref 0.0–2.0)
Bicarbonate: 20.6 mEq/L (ref 20.0–24.0)
Bicarbonate: 21 mEq/L (ref 20.0–24.0)
Bicarbonate: 21.9 mEq/L (ref 20.0–24.0)
O2 Saturation: 51 %
O2 Saturation: 53 %
O2 Saturation: 54 %
TCO2: 22 mmol/L (ref 0–100)
TCO2: 22 mmol/L (ref 0–100)
TCO2: 23 mmol/L (ref 0–100)
pCO2, Ven: 36.4 mmHg — ABNORMAL LOW (ref 45.0–50.0)
pCO2, Ven: 38.1 mmHg — ABNORMAL LOW (ref 45.0–50.0)
pCO2, Ven: 38.5 mmHg — ABNORMAL LOW (ref 45.0–50.0)
pH, Ven: 7.35 — ABNORMAL HIGH (ref 7.250–7.300)
pH, Ven: 7.361 — ABNORMAL HIGH (ref 7.250–7.300)
pH, Ven: 7.363 — ABNORMAL HIGH (ref 7.250–7.300)
pO2, Ven: 28 mmHg — CL (ref 30.0–45.0)
pO2, Ven: 29 mmHg — CL (ref 30.0–45.0)
pO2, Ven: 30 mmHg (ref 30.0–45.0)

## 2013-03-30 SURGERY — JV RIGHT HEART CATHETERIZATION

## 2013-03-30 MED ORDER — SILDENAFIL CITRATE 20 MG PO TABS: 60.0000 mg | ORAL_TABLET | Freq: Three times a day (TID) | ORAL | Status: DC

## 2013-03-30 MED ORDER — ACETAMINOPHEN 325 MG PO TABS: 650.0000 mg | ORAL_TABLET | ORAL | Status: DC | PRN

## 2013-03-30 MED ORDER — SODIUM CHLORIDE 0.9 % IJ SOLN: 3.0000 mL | Freq: Two times a day (BID) | INTRAMUSCULAR | Status: DC

## 2013-03-30 MED ORDER — ONDANSETRON HCL 4 MG/2ML IJ SOLN: 4.0000 mg | Freq: Four times a day (QID) | INTRAMUSCULAR | Status: DC | PRN

## 2013-03-30 MED ORDER — SODIUM CHLORIDE 0.9 % IJ SOLN: 3.0000 mL | INTRAMUSCULAR | Status: DC | PRN

## 2013-03-30 MED ORDER — SODIUM CHLORIDE 0.9 % IV SOLN: 250.0000 mL | INTRAVENOUS | Status: DC | PRN

## 2013-03-30 NOTE — Interval H&P Note (Signed)
History and Physical Interval Note:  03/30/2013 10:53 AM  Brianna Hanson  has presented today for surgery, with the diagnosis of HF ore-VAD cath The various methods of treatment have been discussed with the patient and family. After consideration of risks, benefits and other options for treatment, the patient has consented to  Procedure(s): JV RIGHT HEART CATHETERIZATION (N/A) as a surgical intervention .  [name] history has been reviewed, patient examined, no change in status, stable for surgery.  I have reviewed the patient's chart and labs.  Questions were answered to the patient's satisfaction.     Ova Meegan

## 2013-03-30 NOTE — OR Nursing (Signed)
Discharge instructions reviewed and signed, pt stated understanding, ambulated in hall without difficulty, site level 0, transported to friend's car via wheelchair 

## 2013-03-30 NOTE — OR Nursing (Signed)
Dr [name] at bedside to discuss results and treatment plan with pt and family 

## 2013-03-30 NOTE — H&P (View-Only) (Signed)
Patient ID: Brianna Hanson, female   DOB: 05-06-1935, 78 y.o.   MRN: [id number]  EP: Dr Ladona Ridgel PCP: Dr Yetta Barre  Coumadin- [facility]  HPI: Ms Base is a 78 year old female with a PMH of HF due to severe NICM (EF 15%) diagnosed in 1999, cardiac arrest 2010 -V fib S/P ICD,  PAF (maintaing SR on amio) - on chronic coumadin and plasma cell disorder (Likely IgA MGUS) - followed by DR. Shadad.   She was previously followed by Dr Glori Luis at [facility]. Referred to HF Clinic in 6/14.  ECHO 02/02/13 EF 15% RV mild to moderately HK.   CPX 02/02/13  Peak VO2: 6.4 ml/kg/min  predicted peak VO2: 40.1% VE/VCO2 slope: 58.3 OUES: 0.48 Peak RER: 1.13  Admitted after RHC 03/03/13 with initiation of Milrinone via PICC.  RHC 03/03/13  RA = 11  RV = 51/11/16  PA = 53/20 (32)  PCW = 10  Fick cardiac output/index = 2.6/1.5  Thermo CO/CI = 2.3/1.3  PVR (Fick) = 8.5 Woods  FA sat = 92%  PA sat = 45%, 51%  Underwent f/u RHC on 7/11 with marginal hemodynamics. Milrinone increased to 0.375 and Revatio added. Now at 40 tid. Readmitted post-cath due to PICC site infection. Bcx x2 negative. PICC pulled and Hickman cathter placed for Milrinone.   She returns for post hospital follow up. Overall she feel great. Can walk through store without stopping. Denies SOB/PND/Orthopnea. Weigh at home 160 pounds.  Taking medicatons. No lower extremity edema.     Past Medical History  Diagnosis Date  . Cardiac arrest - ventricular fibrillation 12/10    with successful resucitation, S/p ICD  . ICD (implantable cardiac defibrillator) in place     she has received appropriate therapy for VF  . Atrial fibrillation or flutter   . Osteopenia   . HTN (hypertension)     moderate  . Nonischemic cardiomyopathy     followed by Dr Glori Luis at [facility]  . CHF (congestive heart failure)   . Anemia   . S/P colonoscopy   . Plasma cell disorder 03/20/2012  . Plasma cell disorder 03/20/2012    Current Outpatient Prescriptions   Medication Sig Dispense Refill  . amiodarone (PACERONE) 200 MG tablet Take 1 tablet (200 mg total) by mouth daily.  30 tablet  6  . Cholecalciferol (VITAMIN D3) 1000 UNITS CAPS Take 1 tablet by mouth daily.        . furosemide (LASIX) 80 MG tablet Take 1 tablet (80 mg total) by mouth 2 (two) times daily.  60 tablet  6  . losartan (COZAAR) 50 MG tablet Take 1 tablet (50 mg total) by mouth daily.  30 tablet  3  . milrinone (PRIMACOR) 20 MG/100ML SOLN infusion Inject 28.7625 mcg/min into the vein continuous.  100 mL  6  . potassium chloride SA (K-DUR,KLOR-CON) 20 MEQ tablet Take 1 tablet (20 mEq total) by mouth 2 (two) times daily.  60 tablet  3  . sildenafil (REVATIO) 20 MG tablet Take 2 tablets (40 mg total) by mouth 3 (three) times daily.  180 tablet  3  . warfarin (COUMADIN) 2.5 MG tablet Take 5 mg for 2 more days and then go back to 2.5 mg daily.  60 tablet  3   No current facility-administered medications for this encounter.     No Known Allergies  History   Social History  . Marital Status: Single    Spouse Name: N/A    Number of  Children: 3  . Years of Education: N/A   Occupational History  . Not on file.   Social History Main Topics  . Smoking status: Former Games developer  . Smokeless tobacco: Never Used     Comment: remote history of tobacco abuse  . Alcohol Use: No  . Drug Use: No  . Sexually Active: Not Currently    Birth Control/ Protection: Surgical   Other Topics Concern  . Not on file   Social History Narrative   Lives with spouse who was recently diagnosed with esophageal ca and is receiving chemotherapy    Family History  Problem Relation Age of Onset  . Stroke Brother   . Stroke Mother     PHYSICAL EXAM: Filed Vitals:   03/29/13 1048  BP: 110/64  Pulse: 72   General:  Well appearing. No respiratory difficulty HEENT: normal Neck: supple. JVP 7. Carotids 2+ bilat; no bruits. No lymphadenopathy or thryomegaly appreciated. Hickman site looks clean   Cor: PMI nondisplaced. Regular rate & rhythm.+ s3. Soft MR Lungs: clear Abdomen: soft, nontender, nondistended. No hepatosplenomegaly. No bruits or masses. Good bowel sounds. Extremities: no cyanosis, clubbing, rash, tr edema R> L Neuro: alert & oriented x 3, cranial nerves grossly intact. moves all 4 extremities w/o difficulty. Affect pleasant.   No results found for this or any previous visit (from the past 24 hour(s)). No results found.   ASSESSMENT  1) Chronic systolic HF with biventricular failure and low output physiology  2) NICM       EF 15% Mild to moderate RV dysfunction  3) RV failure 4) PAF, in NSR on amio. Anticoagulated on coumadin  5) PAH  PLAN/DISCUSSION  She seems to be doing much better on increased dose of milrinone and sildenafil. We had long talk about pros/cons of VAD placement. She is eager to proceed if she qualifies. Will schedule repeat RHC tomorrow to assess for response to higher dose of milrinone and addition of sildenafil especially with regard to RV failure. If RHC numbers qualify will expedite getting pre-VAD testing completed for possible VAD placement next week, schedule permitting.   Continue amio and coumadin for AF.   Daniel [name],MD 1:29 PM

## 2013-03-30 NOTE — OR Nursing (Signed)
Meal served 

## 2013-03-30 NOTE — OR Nursing (Signed)
Tegaderm dressing applied, site level 0, bedrest begins at 1135

## 2013-03-30 NOTE — CV Procedure (Addendum)
Cardiac Cath Procedure Note:  Indication: HF pre-VAD assessment  Procedures performed:  1) Right heart catheterization  Description of procedure:   The risks and indication of the procedure were explained. Consent was signed and placed on the chart. An appropriate timeout was taken prior to the procedure. The right groin was prepped and draped in the routine sterile fashion and anesthetized with 1% local lidocaine.   A 7 FR venous sheath was placed in the right femoral vein using a modified Seldinger technique. A standard Swan-Ganz catheter was used for the procedure.   Complications: None apparent.  Findings:  Done on milrinone 0.375 mcg/kg/min and sildenafil 40 tid  RA = 5 RV = 36/23/16-18 PA = 43/27 (34) PCW = 18 Fick cardiac output/index = 3.3/1.9 PVR = 4.9 FA sat = 93% PA sat = 53%, 54%  Assessment/Plan:  Hemodynamics minimally improved on increased dose of milrinone and addition of Revatio. RAP is down but RVEDP remains elevated.  We checked this multiple times in the cath lab and the gradient between the RA and RVEDP seems real. Overall, I think she is an acceptable candidate for LVAD placement but we discussed the fact that she is at high-risk for RV failure particularly in the immediate post-op period and may need temporary RVAD support. Will increase Revatio to 60 tid.  Damia Bobrowski,MD 11:24 AM

## 2013-03-31 ENCOUNTER — Encounter (HOSPITAL_COMMUNITY): Payer: Self-pay | Admitting: Pharmacy Technician

## 2013-04-01 ENCOUNTER — Ambulatory Visit: Payer: Self-pay | Admitting: Cardiovascular Disease

## 2013-04-01 DIAGNOSIS — I5023 Acute on chronic systolic (congestive) heart failure: Secondary | ICD-10-CM | POA: Diagnosis not present

## 2013-04-01 DIAGNOSIS — Z7901 Long term (current) use of anticoagulants: Secondary | ICD-10-CM | POA: Diagnosis not present

## 2013-04-01 DIAGNOSIS — Z452 Encounter for adjustment and management of vascular access device: Secondary | ICD-10-CM | POA: Diagnosis not present

## 2013-04-01 DIAGNOSIS — I10 Essential (primary) hypertension: Secondary | ICD-10-CM | POA: Diagnosis not present

## 2013-04-01 DIAGNOSIS — I509 Heart failure, unspecified: Secondary | ICD-10-CM | POA: Diagnosis not present

## 2013-04-01 DIAGNOSIS — I4891 Unspecified atrial fibrillation: Secondary | ICD-10-CM | POA: Diagnosis not present

## 2013-04-01 LAB — POCT INR: INR: 1.4

## 2013-04-01 NOTE — Pre-Procedure Instructions (Signed)
Brianna Hanson  04/01/2013   Your procedure is scheduled on:  Tues, Aug 5 @ 7:30 AM  Report to [facility] at 5:30 AM.  Call this number if you have problems the morning of surgery: [phone]   Remember:   Do not eat food or drink liquids after midnight.   Take these medicines the morning of surgery with A SIP OF WATER: Amiodarone(Pacerone)               No Goody's,BC's,Aleve,Ibuprofen,Aspirin,Fish Oil,or any Herbal Medications   Do not wear jewelry, make-up or nail polish.  Do not wear lotions, powders, or perfumes. You may wear deodorant.  Do not shave 48 hours prior to surgery.   Do not bring valuables to the hospital.  [facility] is not responsible                   for any belongings or valuables.  Contacts, dentures or bridgework may not be worn into surgery.  Leave suitcase in the car. After surgery it may be brought to your room.  For patients admitted to the hospital, checkout time is 11:00 AM the day of  discharge.   Patients discharged the day of surgery will not be allowed to drive  home.    Special Instructions: Shower using CHG 2 nights before surgery and the night before surgery.  If you shower the day of surgery use CHG.  Use special wash - you have one bottle of CHG for all showers.  You should use approximately 1/3 of the bottle for each shower.   Please read over the following fact sheets that you were given: Pain Booklet, Coughing and Deep Breathing, MRSA Information and Surgical Site Infection Prevention

## 2013-04-02 ENCOUNTER — Encounter (HOSPITAL_COMMUNITY)
Admission: RE | Admit: 2013-04-02 | Discharge: 2013-04-02 | Disposition: A | Discharge status: Home / Self Care | Payer: Medicare Other | Source: Ambulatory Visit | Attending: Surgery | Admitting: Surgery

## 2013-04-02 NOTE — Progress Notes (Addendum)
Requested orders from Dr. Sharee Pimple office. Spoke with Brianna Hanson at office who stated patient should not be preadmitted she was being admitted Monday prior to surgery.

## 2013-04-05 ENCOUNTER — Inpatient Hospital Stay (HOSPITAL_COMMUNITY)
Admission: RE | Admit: 2013-04-05 | Discharge: 2013-04-22 | DRG: 002 | Disposition: A | Discharge status: Home Health | Payer: Medicare Other | Source: Ambulatory Visit | Attending: Surgery | Admitting: Surgery

## 2013-04-05 ENCOUNTER — Encounter: Payer: Self-pay | Admitting: Internal Medicine

## 2013-04-05 ENCOUNTER — Inpatient Hospital Stay (HOSPITAL_COMMUNITY): Payer: Medicare Other

## 2013-04-05 DIAGNOSIS — I2589 Other forms of chronic ischemic heart disease: Secondary | ICD-10-CM | POA: Diagnosis not present

## 2013-04-05 DIAGNOSIS — Z452 Encounter for adjustment and management of vascular access device: Secondary | ICD-10-CM | POA: Diagnosis not present

## 2013-04-05 DIAGNOSIS — R5381 Other malaise: Secondary | ICD-10-CM | POA: Diagnosis not present

## 2013-04-05 DIAGNOSIS — Z515 Encounter for palliative care: Secondary | ICD-10-CM | POA: Diagnosis not present

## 2013-04-05 DIAGNOSIS — J90 Pleural effusion, not elsewhere classified: Secondary | ICD-10-CM | POA: Diagnosis not present

## 2013-04-05 DIAGNOSIS — R109 Unspecified abdominal pain: Secondary | ICD-10-CM | POA: Diagnosis not present

## 2013-04-05 DIAGNOSIS — Z79899 Other long term (current) drug therapy: Secondary | ICD-10-CM | POA: Diagnosis not present

## 2013-04-05 DIAGNOSIS — Z95810 Presence of automatic (implantable) cardiac defibrillator: Secondary | ICD-10-CM

## 2013-04-05 DIAGNOSIS — M899 Disorder of bone, unspecified: Secondary | ICD-10-CM | POA: Diagnosis present

## 2013-04-05 DIAGNOSIS — I5023 Acute on chronic systolic (congestive) heart failure: Principal | ICD-10-CM | POA: Diagnosis present

## 2013-04-05 DIAGNOSIS — E876 Hypokalemia: Secondary | ICD-10-CM | POA: Diagnosis not present

## 2013-04-05 DIAGNOSIS — K449 Diaphragmatic hernia without obstruction or gangrene: Secondary | ICD-10-CM | POA: Diagnosis not present

## 2013-04-05 DIAGNOSIS — R143 Flatulence: Secondary | ICD-10-CM | POA: Diagnosis not present

## 2013-04-05 DIAGNOSIS — I472 Ventricular tachycardia, unspecified: Secondary | ICD-10-CM | POA: Diagnosis not present

## 2013-04-05 DIAGNOSIS — I4891 Unspecified atrial fibrillation: Secondary | ICD-10-CM

## 2013-04-05 DIAGNOSIS — I428 Other cardiomyopathies: Secondary | ICD-10-CM | POA: Diagnosis not present

## 2013-04-05 DIAGNOSIS — R040 Epistaxis: Secondary | ICD-10-CM | POA: Diagnosis not present

## 2013-04-05 DIAGNOSIS — K8020 Calculus of gallbladder without cholecystitis without obstruction: Secondary | ICD-10-CM | POA: Diagnosis not present

## 2013-04-05 DIAGNOSIS — J9819 Other pulmonary collapse: Secondary | ICD-10-CM | POA: Diagnosis not present

## 2013-04-05 DIAGNOSIS — I2789 Other specified pulmonary heart diseases: Secondary | ICD-10-CM | POA: Diagnosis not present

## 2013-04-05 DIAGNOSIS — B9689 Other specified bacterial agents as the cause of diseases classified elsewhere: Secondary | ICD-10-CM | POA: Diagnosis not present

## 2013-04-05 DIAGNOSIS — I10 Essential (primary) hypertension: Secondary | ICD-10-CM | POA: Diagnosis present

## 2013-04-05 DIAGNOSIS — Z7901 Long term (current) use of anticoagulants: Secondary | ICD-10-CM | POA: Diagnosis not present

## 2013-04-05 DIAGNOSIS — M7981 Nontraumatic hematoma of soft tissue: Secondary | ICD-10-CM | POA: Diagnosis not present

## 2013-04-05 DIAGNOSIS — Z8674 Personal history of sudden cardiac arrest: Secondary | ICD-10-CM | POA: Diagnosis not present

## 2013-04-05 DIAGNOSIS — R141 Gas pain: Secondary | ICD-10-CM | POA: Diagnosis not present

## 2013-04-05 DIAGNOSIS — I2721 Secondary pulmonary arterial hypertension: Secondary | ICD-10-CM

## 2013-04-05 DIAGNOSIS — I509 Heart failure, unspecified: Secondary | ICD-10-CM | POA: Diagnosis not present

## 2013-04-05 DIAGNOSIS — N390 Urinary tract infection, site not specified: Secondary | ICD-10-CM | POA: Diagnosis not present

## 2013-04-05 DIAGNOSIS — Z87891 Personal history of nicotine dependence: Secondary | ICD-10-CM

## 2013-04-05 DIAGNOSIS — D472 Monoclonal gammopathy: Secondary | ICD-10-CM | POA: Diagnosis present

## 2013-04-05 DIAGNOSIS — J811 Chronic pulmonary edema: Secondary | ICD-10-CM | POA: Diagnosis not present

## 2013-04-05 DIAGNOSIS — I4729 Other ventricular tachycardia: Secondary | ICD-10-CM | POA: Diagnosis not present

## 2013-04-05 DIAGNOSIS — R531 Weakness: Secondary | ICD-10-CM

## 2013-04-05 DIAGNOSIS — J984 Other disorders of lung: Secondary | ICD-10-CM | POA: Diagnosis not present

## 2013-04-05 DIAGNOSIS — Z95818 Presence of other cardiac implants and grafts: Secondary | ICD-10-CM | POA: Diagnosis not present

## 2013-04-05 DIAGNOSIS — Z01818 Encounter for other preprocedural examination: Secondary | ICD-10-CM | POA: Diagnosis not present

## 2013-04-05 DIAGNOSIS — I429 Cardiomyopathy, unspecified: Secondary | ICD-10-CM | POA: Diagnosis not present

## 2013-04-05 DIAGNOSIS — Z95811 Presence of heart assist device: Secondary | ICD-10-CM

## 2013-04-05 DIAGNOSIS — R0609 Other forms of dyspnea: Secondary | ICD-10-CM | POA: Diagnosis not present

## 2013-04-05 DIAGNOSIS — M949 Disorder of cartilage, unspecified: Secondary | ICD-10-CM | POA: Diagnosis present

## 2013-04-05 LAB — MRSA PCR SCREENING: MRSA by PCR: NEGATIVE

## 2013-04-05 LAB — COMPREHENSIVE METABOLIC PANEL
ALT: 11 U/L (ref 0–35)
AST: 17 U/L (ref 0–37)
Albumin: 3.1 g/dL — ABNORMAL LOW (ref 3.5–5.2)
Alkaline Phosphatase: 90 U/L (ref 39–117)
BUN: 9 mg/dL (ref 6–23)
CO2: 25 mEq/L (ref 19–32)
Calcium: 9.3 mg/dL (ref 8.4–10.5)
Chloride: 106 mEq/L (ref 96–112)
Creatinine, Ser: 1.18 mg/dL — ABNORMAL HIGH (ref 0.50–1.10)
GFR calc Af Amer: 51 mL/min — ABNORMAL LOW (ref >90)
GFR calc non Af Amer: 44 mL/min — ABNORMAL LOW (ref >90)
Glucose, Bld: 86 mg/dL (ref 70–99)
Potassium: 3.5 mEq/L (ref 3.5–5.1)
Sodium: 141 mEq/L (ref 135–145)
Total Bilirubin: 0.8 mg/dL (ref 0.3–1.2)
Total Protein: 7.3 g/dL (ref 6.0–8.3)

## 2013-04-05 LAB — HEMOGLOBIN A1C
Hgb A1c MFr Bld: 5.7 % — ABNORMAL HIGH (ref <5.7)
Mean Plasma Glucose: 117 mg/dL — ABNORMAL HIGH (ref <117)

## 2013-04-05 LAB — URINALYSIS, ROUTINE W REFLEX MICROSCOPIC
Bilirubin Urine: NEGATIVE
Glucose, UA: NEGATIVE mg/dL
Hgb urine dipstick: NEGATIVE
Ketones, ur: NEGATIVE mg/dL
Leukocytes, UA: NEGATIVE
Nitrite: NEGATIVE
Protein, ur: NEGATIVE mg/dL
Specific Gravity, Urine: 1.007 (ref 1.005–1.030)
Urobilinogen, UA: 1 mg/dL (ref 0.0–1.0)
pH: 7.5 (ref 5.0–8.0)

## 2013-04-05 LAB — CBC WITH DIFFERENTIAL/PLATELET
Basophils Absolute: 0 10*3/uL (ref 0.0–0.1)
Basophils Relative: 0 % (ref 0–1)
Eosinophils Absolute: 0.2 10*3/uL (ref 0.0–0.7)
Eosinophils Relative: 4 % (ref 0–5)
HCT: 36.6 % (ref 36.0–46.0)
Hemoglobin: 12.4 g/dL (ref 12.0–15.0)
Lymphocytes Relative: 31 % (ref 12–46)
Lymphs Abs: 1.4 10*3/uL (ref 0.7–4.0)
MCH: 31.4 pg (ref 26.0–34.0)
MCHC: 33.9 g/dL (ref 30.0–36.0)
MCV: 92.7 fL (ref 78.0–100.0)
Monocytes Absolute: 0.5 10*3/uL (ref 0.1–1.0)
Monocytes Relative: 11 % (ref 3–12)
Neutro Abs: 2.5 10*3/uL (ref 1.7–7.7)
Neutrophils Relative %: 54 % (ref 43–77)
Platelets: 188 10*3/uL (ref 150–400)
RBC: 3.95 MIL/uL (ref 3.87–5.11)
RDW: 17.4 % — ABNORMAL HIGH (ref 11.5–15.5)
WBC: 4.6 10*3/uL (ref 4.0–10.5)

## 2013-04-05 LAB — PRO B NATRIURETIC PEPTIDE: Pro B Natriuretic peptide (BNP): 3022 pg/mL — ABNORMAL HIGH (ref 0–450)

## 2013-04-05 LAB — SURGICAL PCR SCREEN
MRSA, PCR: NEGATIVE
Staphylococcus aureus: NEGATIVE

## 2013-04-05 LAB — HEPARIN LEVEL (UNFRACTIONATED): Heparin Unfractionated: 0.25 IU/mL — ABNORMAL LOW (ref 0.30–0.70)

## 2013-04-05 LAB — ABO/RH: ABO/RH(D): O POS

## 2013-04-05 LAB — MAGNESIUM: Magnesium: 1.8 mg/dL (ref 1.5–2.5)

## 2013-04-05 LAB — PROTIME-INR
INR: 1.14 (ref 0.00–1.49)
Prothrombin Time: 14.4 seconds (ref 11.6–15.2)

## 2013-04-05 LAB — PREPARE RBC (CROSSMATCH)

## 2013-04-05 LAB — ANTITHROMBIN III: AntiThromb III Func: 94 % (ref 75–120)

## 2013-04-05 MED ORDER — EPINEPHRINE HCL 1 MG/ML IJ SOLN
0.0000 ug/min | INTRAVENOUS | Status: AC
  Administered 2013-04-06: 3 ug/min via INTRAVENOUS
  Filled 2013-04-05: qty 4

## 2013-04-05 MED ORDER — SODIUM CHLORIDE 0.9 % IV SOLN
INTRAVENOUS | Status: AC
  Administered 2013-04-06: 1 [IU]/h via INTRAVENOUS
  Filled 2013-04-05 (×2): qty 1

## 2013-04-05 MED ORDER — POTASSIUM CHLORIDE CRYS ER 20 MEQ PO TBCR
40.0000 meq | EXTENDED_RELEASE_TABLET | Freq: Once | ORAL | Status: AC
  Administered 2013-04-05: 40 meq via ORAL

## 2013-04-05 MED ORDER — SODIUM CHLORIDE 0.9 % IJ SOLN
3.0000 mL | Freq: Two times a day (BID) | INTRAMUSCULAR | Status: DC
  Administered 2013-04-05 – 2013-04-10 (×2): 3 mL via INTRAVENOUS

## 2013-04-05 MED ORDER — DOPAMINE-DEXTROSE 3.2-5 MG/ML-% IV SOLN
0.0000 ug/kg/min | INTRAVENOUS | Status: DC
  Filled 2013-04-05: qty 250

## 2013-04-05 MED ORDER — DEXTROSE 5 % IV SOLN
750.0000 mg | INTRAVENOUS | Status: DC
  Filled 2013-04-05 (×2): qty 750

## 2013-04-05 MED ORDER — LOSARTAN POTASSIUM 50 MG PO TABS
50.0000 mg | ORAL_TABLET | Freq: Every day | ORAL | Status: DC
  Administered 2013-04-08 – 2013-04-12 (×5): 50 mg via ORAL
  Filled 2013-04-05 (×9): qty 1

## 2013-04-05 MED ORDER — HEPARIN SODIUM (PORCINE) 5000 UNIT/ML IJ SOLN: 5000.0000 [IU] | Freq: Three times a day (TID) | INTRAMUSCULAR | Status: DC

## 2013-04-05 MED ORDER — CHLORHEXIDINE GLUCONATE 4 % EX LIQD
60.0000 mL | Freq: Once | CUTANEOUS | Status: AC
  Administered 2013-04-06: 4 via TOPICAL
  Filled 2013-04-05: qty 60

## 2013-04-05 MED ORDER — ALPRAZOLAM 0.25 MG PO TABS: 0.2500 mg | ORAL_TABLET | ORAL | Status: DC | PRN

## 2013-04-05 MED ORDER — POTASSIUM CHLORIDE 2 MEQ/ML IV SOLN
80.0000 meq | INTRAVENOUS | Status: DC
  Filled 2013-04-05: qty 40

## 2013-04-05 MED ORDER — AMIODARONE HCL 200 MG PO TABS
200.0000 mg | ORAL_TABLET | Freq: Every day | ORAL | Status: DC
  Administered 2013-04-08 – 2013-04-10 (×3): 200 mg via ORAL
  Filled 2013-04-05 (×7): qty 1

## 2013-04-05 MED ORDER — DEXMEDETOMIDINE HCL IN NACL 400 MCG/100ML IV SOLN
0.1000 ug/kg/h | INTRAVENOUS | Status: AC
  Administered 2013-04-06: 0.3 ug/kg/h via INTRAVENOUS
  Filled 2013-04-05: qty 100

## 2013-04-05 MED ORDER — VANCOMYCIN HCL 10 G IV SOLR
1250.0000 mg | INTRAVENOUS | Status: AC
  Administered 2013-04-06: 1250 mg via INTRAVENOUS
  Filled 2013-04-05 (×2): qty 1250

## 2013-04-05 MED ORDER — FUROSEMIDE 80 MG PO TABS
80.0000 mg | ORAL_TABLET | Freq: Two times a day (BID) | ORAL | Status: DC
  Filled 2013-04-05 (×2): qty 1

## 2013-04-05 MED ORDER — SODIUM CHLORIDE 0.9 % IV SOLN
INTRAVENOUS | Status: DC
  Filled 2013-04-05 (×2): qty 40

## 2013-04-05 MED ORDER — SODIUM CHLORIDE 0.9 % IV SOLN
250.0000 mL | INTRAVENOUS | Status: DC | PRN
  Administered 2013-04-05: 250 mL via INTRAVENOUS

## 2013-04-05 MED ORDER — HEPARIN BOLUS VIA INFUSION
3000.0000 [IU] | Freq: Once | INTRAVENOUS | Status: AC
  Administered 2013-04-05: 3000 [IU] via INTRAVENOUS
  Filled 2013-04-05: qty 3000

## 2013-04-05 MED ORDER — POTASSIUM CHLORIDE 20 MEQ PO PACK: 40.0000 meq | PACK | Freq: Once | ORAL | Status: DC

## 2013-04-05 MED ORDER — MAGNESIUM SULFATE 50 % IJ SOLN
40.0000 meq | INTRAMUSCULAR | Status: DC
  Filled 2013-04-05: qty 10

## 2013-04-05 MED ORDER — CHLORHEXIDINE GLUCONATE CLOTH 2 % EX PADS
6.0000 | MEDICATED_PAD | Freq: Once | CUTANEOUS | Status: AC
  Administered 2013-04-05: 6 via TOPICAL

## 2013-04-05 MED ORDER — SODIUM CHLORIDE 0.9 % IJ SOLN: 3.0000 mL | INTRAMUSCULAR | Status: DC | PRN

## 2013-04-05 MED ORDER — HEPARIN (PORCINE) IN NACL 100-0.45 UNIT/ML-% IJ SOLN
900.0000 [IU]/h | INTRAMUSCULAR | Status: DC
  Administered 2013-04-05: 800 [IU]/h via INTRAVENOUS
  Administered 2013-04-06: 900 [IU]/h via INTRAVENOUS
  Filled 2013-04-05 (×2): qty 250

## 2013-04-05 MED ORDER — CHLORHEXIDINE GLUCONATE 4 % EX LIQD
60.0000 mL | Freq: Once | CUTANEOUS | Status: AC
  Administered 2013-04-05: 4 via TOPICAL
  Filled 2013-04-05: qty 60

## 2013-04-05 MED ORDER — RIFAMPIN 300 MG PO CAPS
600.0000 mg | ORAL_CAPSULE | Freq: Once | ORAL | Status: AC
  Administered 2013-04-06: 600 mg via ORAL
  Filled 2013-04-05: qty 2

## 2013-04-05 MED ORDER — MILRINONE IN DEXTROSE 20 MG/100ML IV SOLN
0.3750 ug/kg/min | INTRAVENOUS | Status: DC
  Administered 2013-04-05 (×2): 0.375 ug/kg/min via INTRAVENOUS
  Filled 2013-04-05: qty 100

## 2013-04-05 MED ORDER — PHENYLEPHRINE HCL 10 MG/ML IJ SOLN
0.0000 ug/min | INTRAVENOUS | Status: AC
  Administered 2013-04-06: 20 ug/min via INTRAVENOUS
  Filled 2013-04-05 (×2): qty 2

## 2013-04-05 MED ORDER — NOREPINEPHRINE BITARTRATE 1 MG/ML IJ SOLN
0.0000 ug/min | INTRAVENOUS | Status: AC
  Administered 2013-04-06: 3 ug/min via INTRAVENOUS
  Filled 2013-04-05 (×2): qty 8

## 2013-04-05 MED ORDER — DOBUTAMINE IN D5W 4-5 MG/ML-% IV SOLN
2.0000 ug/kg/min | INTRAVENOUS | Status: DC
  Filled 2013-04-05: qty 250

## 2013-04-05 MED ORDER — FLUCONAZOLE IN SODIUM CHLORIDE 400-0.9 MG/200ML-% IV SOLN
400.0000 mg | INTRAVENOUS | Status: AC
  Administered 2013-04-06: 400 mg via INTRAVENOUS
  Filled 2013-04-05: qty 200

## 2013-04-05 MED ORDER — CHLORHEXIDINE GLUCONATE CLOTH 2 % EX PADS
6.0000 | MEDICATED_PAD | Freq: Once | CUTANEOUS | Status: AC
  Administered 2013-04-06: 6 via TOPICAL

## 2013-04-05 MED ORDER — SODIUM CHLORIDE 0.9 % IV SOLN
INTRAVENOUS | Status: DC
  Filled 2013-04-05 (×2): qty 30

## 2013-04-05 MED ORDER — DEXTROSE 5 % IV SOLN
1.5000 g | INTRAVENOUS | Status: AC
  Administered 2013-04-06: 1.5 g via INTRAVENOUS
  Administered 2013-04-06: .75 g via INTRAVENOUS
  Filled 2013-04-05 (×2): qty 1.5

## 2013-04-05 MED ORDER — NITROGLYCERIN IN D5W 200-5 MCG/ML-% IV SOLN
0.0000 ug/min | INTRAVENOUS | Status: DC
  Filled 2013-04-05: qty 250

## 2013-04-05 MED ORDER — ONDANSETRON HCL 4 MG/2ML IJ SOLN: 4.0000 mg | Freq: Four times a day (QID) | INTRAMUSCULAR | Status: DC | PRN

## 2013-04-05 MED ORDER — SILDENAFIL CITRATE 20 MG PO TABS
60.0000 mg | ORAL_TABLET | Freq: Three times a day (TID) | ORAL | Status: DC
  Administered 2013-04-05 – 2013-04-09 (×9): 60 mg via ORAL
  Filled 2013-04-05 (×17): qty 3

## 2013-04-05 MED ORDER — MILRINONE IN DEXTROSE 20 MG/100ML IV SOLN
0.3000 ug/kg/min | INTRAVENOUS | Status: AC
  Administered 2013-04-06: 0.375 ug/kg/min via INTRAVENOUS
  Filled 2013-04-05: qty 100

## 2013-04-05 MED ORDER — POTASSIUM CHLORIDE CRYS ER 20 MEQ PO TBCR
20.0000 meq | EXTENDED_RELEASE_TABLET | Freq: Two times a day (BID) | ORAL | Status: DC
  Filled 2013-04-05: qty 2
  Filled 2013-04-05 (×6): qty 1

## 2013-04-05 MED ORDER — VANCOMYCIN HCL 1000 MG IV SOLR
1000.0000 mg | INTRAVENOUS | Status: AC
  Administered 2013-04-06: 1000 mg
  Filled 2013-04-05 (×3): qty 1000

## 2013-04-05 MED ORDER — ACETAMINOPHEN 325 MG PO TABS
650.0000 mg | ORAL_TABLET | ORAL | Status: DC | PRN
  Administered 2013-04-14 – 2013-04-17 (×2): 650 mg via ORAL
  Filled 2013-04-05 (×3): qty 2

## 2013-04-05 MED ORDER — FUROSEMIDE 10 MG/ML IJ SOLN
80.0000 mg | Freq: Once | INTRAMUSCULAR | Status: AC
  Administered 2013-04-05: 80 mg via INTRAVENOUS
  Filled 2013-04-05: qty 8

## 2013-04-05 MED ORDER — ASPIRIN EC 81 MG PO TBEC
81.0000 mg | DELAYED_RELEASE_TABLET | Freq: Every day | ORAL | Status: DC
  Administered 2013-04-05: 81 mg via ORAL
  Filled 2013-04-05 (×2): qty 1

## 2013-04-05 MED ORDER — VITAMIN D3 25 MCG (1000 UNIT) PO TABS
1000.0000 [IU] | ORAL_TABLET | Freq: Every day | ORAL | Status: DC
  Administered 2013-04-05 – 2013-04-22 (×15): 1000 [IU] via ORAL
  Filled 2013-04-05 (×18): qty 1

## 2013-04-05 MED ORDER — VASOPRESSIN 20 UNIT/ML IJ SOLN
0.0400 [IU]/min | INTRAVENOUS | Status: DC
  Filled 2013-04-05 (×2): qty 2.5

## 2013-04-05 MED ORDER — MUPIROCIN 2 % EX OINT
1.0000 "application " | TOPICAL_OINTMENT | Freq: Two times a day (BID) | CUTANEOUS | Status: AC
  Administered 2013-04-05: 1 via NASAL
  Filled 2013-04-05: qty 22

## 2013-04-05 MED ORDER — TEMAZEPAM 15 MG PO CAPS: 15.0000 mg | ORAL_CAPSULE | Freq: Once | ORAL | Status: AC | PRN

## 2013-04-05 MED ORDER — BISACODYL 5 MG PO TBEC
5.0000 mg | DELAYED_RELEASE_TABLET | Freq: Once | ORAL | Status: AC
  Administered 2013-04-05: 5 mg via ORAL
  Filled 2013-04-05: qty 1

## 2013-04-05 MED ORDER — HEPARIN BOLUS VIA INFUSION
3500.0000 [IU] | Freq: Once | INTRAVENOUS | Status: DC
  Filled 2013-04-05: qty 3500

## 2013-04-05 MED ORDER — MILRINONE IN DEXTROSE 20 MG/100ML IV SOLN
0.3750 ug/kg/min | INTRAVENOUS | Status: DC
  Administered 2013-04-05: 0.361 ug/kg/min via INTRAVENOUS
  Filled 2013-04-05: qty 100

## 2013-04-05 NOTE — Consult Note (Signed)
Patient [name]      DOB: September 28, 1934      AVW:[id number]     Consult Note from the Palliative Medicine Team at [facility]    Consult Requested by: Dr Jones Broom     PCP: Sanda Linger, MD Reason for Consultation: Discussion of GOC/Preparedness Plan       Phone [phone]    This NP Lorinda Creed reviewed medical records, received report from team, assessed the patient and then meet at the bedside with  her significant other [name] # [phone] to discuss advanced directives and a preparedness plan in light of scheduled LVAD implantation tomorrow morning.  This is a destination therapy.  A detailed discussion was had today regarding the concept of a preparedness plan as it relates to LVAD therpay with intention of destination therapy.   Patient and family were comfortable talking about the "what ifs" and the importance of today's conversation so everyone can have all the information, be full participants and to understand the patient's basic beliefs and wishes as it relates  to healthcare.  Concepts specific to future possibilities of -long term ventilation -artificial feeding and hydration -long term antibiotic use -dialysis -psychological adjustments -need to terminate the pump- -consideration of Other chronic or terminal disease unrelated to cardiac LVAD were discussed  Patient was able to verbalize to her family the importance of quality of life.  She  is hopeful  that LVAD procedure will increase her qualitty of life.   She understands the risks and benefits of the procedure as it relates to her future.  At this time patient is open to all available medical interventions to prolong life and the success of the LVAD therapy. She shared and verbalized an understanding that she and her family would know when the burdens of treatment would outweigh the benefits and trust in her family's support at that time.   Patient and family were encouraged to  continue conversation into the future as it is vital for the patient centered care.  Chaplain services offered and denied at this time. Strong family and community church support.  Assessment of patients Current state:Brianna Hanson is a 78 year old female with a PMH of HF due to severe NICM (EF 15%) diagnosed in 1999, cardiac arrest 2010 -V fib S/P ICD, PAF (maintaing SR on amio) - on chronic coumadin and plasma cell disorder (Likely IgA MGUS) - followed by DR. Shadad.  She was followed by Dr Glori Luis at [facility] and was previously on transplant list she was last seen June 2013. Previously on spiro but stopped by Dr. Glori Luis due to hyperkalemia. Has never been on digoxin.  ECHO 02/02/13: EF 15% RV mild to moderately HK.  CPX 02/02/13: Peak VO2 6.4 ml/kg/min; predicted peak 40.1% VE/CO2: 58.3; Peak RER: 1.13; OUES: 0.48  Patient has been followed closely in the HF clinic and has been in the Pre-VAD process. Continues with NYHA IIIB. Denies edema, orthopnea or PND. She presented to the hospital today for a scheduled RHC and was found to have low output HF with elevated R-sided pressures. Continued weakness and fatigue  Admitted today for LVAD implantation in the morning.    Brief HPI: Brianna Hanson is a 78 year old female with a PMH of HF due to severe NICM (EF 15%) diagnosed in 1999, cardiac arrest 2010 -V fib S/P ICD, PAF (maintaing SR on amio) - on chronic coumadin and plasma cell disorder (Likely IgA MGUS) - followed by DR. Shadad.  She was  followed by Dr Glori Luis at [facility] and was previously on transplant list she was last seen June 2013. Previously on spiro but stopped by Dr. Glori Luis due to hyperkalemia. Has never been on digoxin.  ECHO 02/02/13: EF 15% RV mild to moderately HK.  CPX 02/02/13: Peak VO2 6.4 ml/kg/min; predicted peak 40.1% VE/CO2: 58.3; Peak RER: 1.13; OUES: 0.48  Patient has been followed closely in the HF clinic and has been in the Pre-VAD process. Continues with NYHA IIIB. Denies edema, orthopnea or  PND. She presented to the hospital today for a scheduled RHC and was found to have low output HF with elevated R-sided pressures.   Admitted today for LVAD implantation in the morning.    ROS: weakness, fatigue    PMH:  Past Medical History  Diagnosis Date  . Cardiac arrest - ventricular fibrillation 12/10    with successful resucitation, S/p ICD  . ICD (implantable cardiac defibrillator) in place     she has received appropriate therapy for VF  . Atrial fibrillation or flutter   . Osteopenia   . HTN (hypertension)     moderate  . Nonischemic cardiomyopathy     followed by Dr Glori [facility] at [facility]  . CHF (congestive heart failure)   . Anemia   . S/P colonoscopy   . Plasma cell disorder 03/20/2012  . Plasma cell disorder 03/20/2012     PSH: Past Surgical History  Procedure Laterality Date  . Abdominal hysterectomy    . Cardiac catheterization    . Cardiac defibrillator placement      by JA for secondary prevention of sudden death   I have reviewed the FH and SH and  If appropriate update it with new information. No Known Allergies Scheduled Meds: . amiodarone  200 mg Oral Daily  . aspirin EC  81 mg Oral Daily  . bisacodyl  5 mg Oral Once  . chlorhexidine  60 mL Topical Once  . [START ON 04/06/2013] chlorhexidine  60 mL Topical Once  . Chlorhexidine Gluconate Cloth  6 each Topical Once  . [START ON 04/06/2013] Chlorhexidine Gluconate Cloth  6 each Topical Once  . cholecalciferol  1,000 Units Oral Daily  . furosemide  80 mg Intravenous Once  . losartan  50 mg Oral Daily  . mupirocin ointment  1 application Nasal BID  . potassium chloride  20 mEq Oral BID  . [START ON 04/06/2013] rifampin  600 mg Oral Once  . sildenafil  60 mg Oral TID  . sodium chloride  3 mL Intravenous Q12H   Continuous Infusions: . milrinone     PRN Meds:.sodium chloride, acetaminophen, ALPRAZolam, ondansetron (ZOFRAN) IV, sodium chloride, temazepam    BP 127/70  Pulse 92  Temp(Src) 98.2 F  (36.8 C) (Oral)  Resp 19  Wt 73.9 kg (162 lb 14.7 oz)  BMI 31.82 kg/m2  SpO2 95%   PPS: 30 % at this time of hospitalization  No intake or output data in the 24 hours ending 04/05/13 1350   Physical Exam:  General: alert and oriented, pleasant/cooperative and engaged in meeting HEENT:  Mm, no exudate, +JVD Chest:   CTA CVS: irregular, no murmur noted Abdomen:soft NT +BS Ext: without edema Neuro:alert and oriented, in positive mood, verbalizes hope for tomorrow  Labs: CBC    Component Value Date/Time   WBC 4.6 04/05/2013 1220   WBC 4.8 02/10/2013 1053   RBC 3.95 04/05/2013 1220   RBC 4.43 02/10/2013 1053   HGB 12.4 04/05/2013 1220   HGB  13.8 02/10/2013 1053   HCT 36.6 04/05/2013 1220   HCT 41.2 02/10/2013 1053   PLT 188 04/05/2013 1220   PLT 167 02/10/2013 1053   MCV 92.7 04/05/2013 1220   MCV 93.1 02/10/2013 1053   MCH 31.4 04/05/2013 1220   MCH 31.1 02/10/2013 1053   MCHC 33.9 04/05/2013 1220   MCHC 33.4 02/10/2013 1053   RDW 17.4* 04/05/2013 1220   RDW 16.0* 02/10/2013 1053   LYMPHSABS 1.4 04/05/2013 1220   LYMPHSABS 1.7 02/10/2013 1053   MONOABS 0.5 04/05/2013 1220   MONOABS 0.8 02/10/2013 1053   EOSABS 0.2 04/05/2013 1220   EOSABS 0.1 02/10/2013 1053   BASOSABS 0.0 04/05/2013 1220   BASOSABS 0.1 02/10/2013 1053    BMET    Component Value Date/Time   NA 141 04/05/2013 1220   NA 143 02/10/2013 1053   K 3.5 04/05/2013 1220   K 3.7 02/10/2013 1053   CL 106 04/05/2013 1220   CL 110* 02/10/2013 1053   CO2 25 04/05/2013 1220   CO2 25 02/10/2013 1053   GLUCOSE 86 04/05/2013 1220   GLUCOSE 97 02/10/2013 1053   BUN 9 04/05/2013 1220   BUN 17.5 02/10/2013 1053   CREATININE 1.18* 04/05/2013 1220   CREATININE 1.4* 02/10/2013 1053   CALCIUM 9.3 04/05/2013 1220   CALCIUM 8.9 02/10/2013 1053   GFRNONAA 44* 04/05/2013 1220   GFRAA 51* 04/05/2013 1220    CMP     Component Value Date/Time   NA 141 04/05/2013 1220   NA 143 02/10/2013 1053   K 3.5 04/05/2013 1220   K 3.7 02/10/2013 1053   CL 106 04/05/2013 1220   CL 110*  02/10/2013 1053   CO2 25 04/05/2013 1220   CO2 25 02/10/2013 1053   GLUCOSE 86 04/05/2013 1220   GLUCOSE 97 02/10/2013 1053   BUN 9 04/05/2013 1220   BUN 17.5 02/10/2013 1053   CREATININE 1.18* 04/05/2013 1220   CREATININE 1.4* 02/10/2013 1053   CALCIUM 9.3 04/05/2013 1220   CALCIUM 8.9 02/10/2013 1053   PROT 7.3 04/05/2013 1220   PROT 6.9 02/10/2013 1053   ALBUMIN 3.1* 04/05/2013 1220   ALBUMIN 3.0* 02/10/2013 1053   AST 17 04/05/2013 1220   AST 22 02/10/2013 1053   ALT 11 04/05/2013 1220   ALT 21 02/10/2013 1053   ALKPHOS 90 04/05/2013 1220   ALKPHOS 99 02/10/2013 1053   BILITOT 0.8 04/05/2013 1220   BILITOT 0.67 02/10/2013 1053   GFRNONAA 44* 04/05/2013 1220   GFRAA 51* 04/05/2013 1220      Time In Time Out Total Time Spent with Patient Total Overall Time  1230 1345 70 min 75 min    Greater than 50%  of this time was spent counseling and coordinating care related to the above assessment and plan.  Lorinda Creed NP  Palliative Medicine Team Team Phone # [phone] Pager [phone]

## 2013-04-05 NOTE — Progress Notes (Signed)
Brianna Hanson [id number] 04/05/2013  Cardiothoracic Surgery  Preop Note:  HPI:  The patient is a 78 year old female with a history of heart failure due to non-ischemic cardiomyopathy with an EF of 15% diagnosed in 1999. She had a cardiac arrest in 2010 due to V-fib and was successfully resusitated and had an ICD placed. She has had PAF but has maintained sinus rhythm on amiodarone. She has been followed in the heart failure clinic with NYHA IIIB symptoms. She had been followed at [facility] by Dr. Glori Luis and was previously on the transplant list. She decided to be followed at [facility] after Dr. Glori Luis left. She was scheduled for a RHC on 03/03/2013 and was found to have low output HF with elevated right heart pressures with PA = 53/20 (32), RV = 51/11/16, RA = 11, CI = 1.5, PVR = 8.5. She was started on Milrinone and LH/RH cath on 03/12/2013 showed normal coronaries with severe biventricular dysfunction with moderate PAH and marginal RV function. The Milrinone was increased to 0.375 and Revatio 20 tid was started. She was noted to have a PICC line infection and it was removed. She completed 48 hrs of antibiotics and blood cultures were negative. She had a new PICC placed. Since discharge she says she has felt better with no dyspnea, orthopnea or edema.  She has remained weak. Right heart cath on 03/30/2013 on milrinone 0.375 mcg/kd/min and sildenafil 40 tid showed low RA pressure of 5 but elevated RVEDP of 16-18. PA pressure was 43/27 with PCW of 18 and CI 1.9.  Physical Exam:  BP 122/77  Pulse 98  Temp(Src) 98.2 F (36.8 C) (Oral)  Resp 16  Ht 4' 11.84" (1.52 m)  Wt 73.9 kg (162 lb 14.7 oz)  BMI 31.99 kg/m2  SpO2 94% She is alert and in good spirits Lung exam is clear Cardiac exam shows and irregular rate and rhythm with no murmur, + S3. Abdomen is soft and nontender.  Extremities show no edema  Labs show WBC 4.6, Hgb 12.4, Creat 1.18.   A/P: She has class IV heart failure despite IV Milrinone and is  getting weaker. It is time to proceed with LVAD insertion. I have discussed the procedure with her and her caregiver including the alternative of continued medical therapy or palliative care. I discussed the  benefits and risks; including but not limited to bleeding, blood transfusion, infection, stroke, myocardial infarction, right heart failure requiring support with an RVAD, heart block requiring a permanent pacemaker, organ dysfunction, and death. I discussed the longer term risks of pump thrombosis, bleeding and thromboembolism, pump failure, and drive line or pump infection.  Guadelupe Sabin understands and agrees to proceed.  We will proceed with surgery in the am.

## 2013-04-05 NOTE — Progress Notes (Signed)
  ANTICOAGULATION CONSULT NOTE - Initial Consult  Pharmacy Consult for Heparin Indication: atrial fibrillation  No Known Allergies  Patient Measurements: Height: 4' 11.84" (152 cm) Weight: 162 lb 14.7 oz (73.9 kg) (per RN) IBW/kg (Calculated) : 45.14 Heparin Dosing Weight: 61.6  Vital Signs: Temp: 98.2 F (36.8 C) (08/04 1153) Temp src: Oral (08/04 1153) BP: 121/82 mmHg (08/04 1400) Pulse Rate: 94 (08/04 1400)  Labs:  Recent Labs  04/05/13 1220 04/05/13 1346  HGB 12.4  --   HCT 36.6  --   PLT 188  --   LABPROT  --  14.4  INR  --  1.14  CREATININE 1.18*  --     Estimated Creatinine Clearance: 36.2 ml/min (by C-G formula based on Cr of 1.18).   Medical History: Past Medical History  Diagnosis Date  . Cardiac arrest - ventricular fibrillation 12/10    with successful resucitation, S/p ICD  . ICD (implantable cardiac defibrillator) in place     she has received appropriate therapy for VF  . Atrial fibrillation or flutter   . Osteopenia   . HTN (hypertension)     moderate  . Nonischemic cardiomyopathy     followed by Dr Glori Luis at [facility]  . CHF (congestive heart failure)   . Anemia   . S/P colonoscopy   . Plasma cell disorder 03/20/2012  . Plasma cell disorder 03/20/2012    Medications:  Prescriptions prior to admission  Medication Sig Dispense Refill  . amiodarone (PACERONE) 200 MG tablet Take 200 mg by mouth daily.      . Cholecalciferol (VITAMIN D3) 1000 UNITS CAPS Take 1 tablet by mouth daily.        . furosemide (LASIX) 80 MG tablet Take 80 mg by mouth 2 (two) times daily.      [name] losartan (COZAAR) 50 MG tablet Take 50 mg by mouth daily.      . milrinone (PRIMACOR) 20 MG/100ML SOLN infusion Inject 28.7625 mcg/min into the vein continuous.  100 mL  6  . potassium chloride SA (K-DUR,KLOR-CON) 20 MEQ tablet Take 20 mEq by mouth 2 (two) times daily.      . sildenafil (REVATIO) 20 MG tablet Take 60 mg by mouth 3 (three) times daily.      [name] warfarin  (COUMADIN) 2.5 MG tablet Take 2.5 mg by mouth daily.        Assessment: 78 yo F with PMH significant for HF d/t severe NICM (EF 15%) and AFib presenting for LVAD placement on 8/5. Pt has been holding warfarin since Wednesday 7/30. BL INR is 1.14, PT 14.4. H/H and plts wnl.    Goal of Therapy:  Heparin level 0.3-0.7 units/ml Monitor platelets by anticoagulation protocol: Yes   Plan:  Give 3000 units bolus x 1 Start heparin infusion at 800 units/hr Check anti-Xa level in 8 hours and daily while on heparin Continue to monitor H&H and platelets   Margie Billet, PharmD Clinical Pharmacist - Resident Pager: [phone] Pharmacy: [phone] 04/05/2013 3:03 PM

## 2013-04-05 NOTE — Care Management Note (Unsigned)
    Page 1 of 2   04/21/2013     3:18:19 PM   CARE MANAGEMENT NOTE 04/21/2013  Patient:  Brianna Hanson, Brianna Hanson   Account Number:  [id number]  Date Initiated:  04/05/2013  Documentation initiated by:  Junius Creamer  Subjective/Objective Assessment:   adm w heart fialure     Action/Plan:   lives w fam, pcp dr Sanda Linger, act w [facility] for home iv milrinone   Anticipated DC Date:  04/20/2013   Anticipated DC Plan:  HOME W HOME HEALTH SERVICES  In-house referral  Clinical Social Worker      DC Planning Services  CM consult      [facility] Choice  Resumption Of Svcs/PTA Provider   Choice offered to / List presented to:  C-1 Patient   DME arranged  BEDSIDE COMMODE      DME agency  [facility]     HH arranged  HH-1 RN  HH-2 PT  HH-3 OT      [facility] agency  [facility]   Status of service:  In process, will continue to follow Medicare Important Message given?   (If response is "NO", the following Medicare IM given date fields will be blank) Date Medicare IM given:   Date Additional Medicare IM given:    Discharge Disposition:  HOME W HOME HEALTH SERVICES  Per UR Regulation:  Reviewed for med. necessity/level of care/duration of stay  If discussed at Long Length of Stay Meetings, dates discussed:   04/13/2013  04/15/2013  04/20/2013    Comments:  Contact:  [name] Friend [phone]                Shomaker,Scottie Son [phone]  04/21/13 Merideth Bosque,RN,BSN [phone] MET WITH PT TO FINALIZE DC PLANS.  PT STATES HUSBAND ARRANGING FOR ROLLATOR THROUGH [facility].  WILL HAVE 3 IN 1 BSC DELIVERED TO ROOM IN AM.  PT DENIES ANY OTHER DME NEEDS. [facility] NOTIFIED OF [facility] DC TOMORROW 04/22/13.  04/20/13 Paulita Licklider,RN,BSN [phone] PT CONT TO MAKE GOOD PROGRESS.  TENTATIVE DC DATE 04/22/13.  04/14/13 Myana Schlup,RN,BSN [phone] PT NEEDS ROLLATOR FOR HOME.  MEDICARE WILL COVER COST OF RW ONCE EVERY 5 YRS.  APPEARS PT RECEIVED RW IN 2010, THEREFORE, ROLLATOR  WILL NOT BE COVERED.  PT STATES SHE WILL DISCUSS PAYING OUT OF POCKET FOR ROLLATOR WITH HUSBAND.  COST $69 AT [facility].  04/13/13 Abigael Mogle,RN,BSN [phone] PT PROGRESSING WELL; MET WITH PT AND DISCUSSED DME NEEDED FOR HOME.  SHE HAS BATH CHAIR, BUT WILL NEED ROLLATOR AND 3 IN 1.  [facility] FOLLOWING FOR [facility] NEEDS AT DC.  04-06-13 3:30pm Avie Arenas, RNBSN [phone] post op Lvad today.  CM will continue to follow for discharge needs.  8/4 1342 debbie dowell rn,bsn called by mary w [facility] that pt act w them for home iv milrinone. pt in for lvad eval. will follow.

## 2013-04-05 NOTE — Progress Notes (Signed)
Brianna Hanson has been discussed with the VAD Medical Review board on 04/05/13.  The team feels as if the patient is a good candidate for DT LVAD therapy. The patient meets criteria for a LVAD implant as listed below:  1)  Have failed to respond to optimal medical management (including beta-blockers, and ACE inhibitors if tolerated) for   at least 45 of the last 60 days, or have been balloon dependent for 7 days, or IV inotrope dependent for 14 days.        Inotrope: Milrinone started 03/03/13  2)  Have a left ventricular ejection fraction (LVEF) < 25%; and, have demonstrated functional limitation with a peak       oxygen consumption of <14 ml/kg/min unless balloon pump or inotrope dependent or physically unable to perform          the test.         EF 15%  By echo (02/02/13)            CPX (02/02/13)  PVO2  6.4 ml/kg/min   RER  1.13     VE/VCo2 slope  58.3  3)  Social work and palliative care evaluations demonstrate appropriate support system in place for discharge to              home with a VAD and that end of life discussions have taken place.         Social work consult (date):  03/16/13         Palliative Care Consult (date): 8/4/014  4)  Primary caretaker identified that can be taught along with the patient how to manage        the VAD equipment.       Name:    Brianna Hanson, significant other  5)  Deemed appropriate by our Presenter, broadcasting.        Prior approval:  Medicare pt; prior authorization not required  6)  VAD Coordinator has met with patient and caregiver and shown them the actual VAD       equipment. Along with showing them the equipment the VAD Coordinator has                 discussed with the patient and caregiver about lifestyle changes and the consent form.        Met with pt and significant other on 04/05/13.  7)  Patient has met an LVAD pt and had all questions answered.   Pt is Intermacs profile 2, NYHA Class IV.   Patient seen and examined with [name], RV/VAD coordinator. We discussed all aspects of the encounter. I agree with the assessment as stated above. She is persistent NYHA IV despite inotropic support (INTERMACS 2). PAtient discussed at [facility] meeting and approved for VAD.   Brianna Hanson 6:17 PM

## 2013-04-05 NOTE — H&P (Signed)
ADVANCED HF ADMIT NOTE   HPI: Brianna Hanson is a 78 year old female with a PMH of HF due to severe NICM (EF 15%) diagnosed in 1999 on chronic Milrinone at 0.375 mcg, cardiac arrest 2010 -V fib S/P ICD, PAF (maintaing SR on amio) - on chronic coumadin and plasma cell disorder (Likely IgA MGUS) - followed by DR. Shadad. She has been followed closely in the HF clinic due to advanced therapy work up.   ECHO 02/02/13 EF 15% RV mild to moderately HK.   CPX 02/02/13  Peak VO2: 6.4 ml/kg/min predicted peak VO2: 40.1% VE/VCO2 slope: 58.3 OUES: 0.48 Peak RER: 1.13  03/30/13 RHC Done on milrinone 0.375 mcg/kg/min and sildenafil 40 tid which was increased to 60 mg tid post RHC.  RA = 5  RV = 36/23/16-18  PA = 43/27 (34)  PCW = 18  Fick cardiac output/index = 3.3/1.9  PVR = 4.9  FA sat = 93%  PA sat = 53%, 54%  She has had NYHA CLASS IV symptoms with trouble with self-care activities despite inotropic support. She is admitted today for medical optimization in anticipation of LVAD in am.  Denies orthopnea/PND/edema.  Review of Systems:     Cardiac Review of Systems: {Y] = yes [ ]  = no  Chest Pain [    ]  Resting SOB [   ] Exertional SOB  [ Y ]  Orthopnea [  ]   Pedal Edema [   ]    Palpitations [  ] Syncope  [  ]   Presyncope [   ]  General Review of Systems: [Y] = yes [  ]=no Constitional: recent weight change [  ]; anorexia [  ]; fatigue [Y  ]; nausea [  ]; night sweats [  ]; fever [  ]; or chills [  ];                                                                                                                                            Eye : blurred vision [  ]; diplopia [   ]; vision changes [  ];  Amaurosis fugax[  ]; Resp: cough [  ];  wheezing[  ];  hemoptysis[  ]; shortness of breath[Y  ]; paroxysmal nocturnal dyspnea[  ]; dyspnea on exertion[ Y ]; or orthopnea[  ];  GI:  gallstones[  ], vomiting[  ];  dysphagia[  ]; melena[  ];  hematochezia [  ]; heartburn[  ];    GU: kidney stones [  ];  hematuria[  ];   dysuria [  ];  nocturia[  ];  history of     obstruction [  ];                 Skin: rash, swelling[  ];, hair loss[  ];  peripheral edema[  ];  or  itching[  ]; Musculosketetal: myalgias[  ];  joint swelling[  ];  joint erythema[  ];  joint pain[  ];  back pain[  ];  Heme/Lymph: bruising[  ];  bleeding[  ];  anemia[  ];  Neuro: TIA[  ];  headaches[  ];  stroke[  ];  vertigo[  ];  seizures[  ];   paresthesias[  ];  difficulty walking[  ];  Psych:depression[  ]; anxiety[  ];  Endocrine: diabetes[  ];  thyroid dysfunction[  ];  Other:  Past Medical History  Diagnosis Date  . Cardiac arrest - ventricular fibrillation 12/10    with successful resucitation, S/p ICD  . ICD (implantable cardiac defibrillator) in place     she has received appropriate therapy for VF  . Atrial fibrillation or flutter   . Osteopenia   . HTN (hypertension)     moderate  . Nonischemic cardiomyopathy     followed by Dr Glori Luis at [facility]  . CHF (congestive heart failure)   . Anemia   . S/P colonoscopy   . Plasma cell disorder 03/20/2012  . Plasma cell disorder 03/20/2012    Medications Prior to Admission  Medication Sig Dispense Refill  . amiodarone (PACERONE) 200 MG tablet Take 200 mg by mouth daily.      . Cholecalciferol (VITAMIN D3) 1000 UNITS CAPS Take 1 tablet by mouth daily.        . furosemide (LASIX) 80 MG tablet Take 80 mg by mouth 2 (two) times daily.      [name] losartan (COZAAR) 50 MG tablet Take 50 mg by mouth daily.      . milrinone (PRIMACOR) 20 MG/100ML SOLN infusion Inject 28.7625 mcg/min into the vein continuous.  100 mL  6  . potassium chloride SA (K-DUR,KLOR-CON) 20 MEQ tablet Take 20 mEq by mouth 2 (two) times daily.      . sildenafil (REVATIO) 20 MG tablet Take 60 mg by mouth 3 (three) times daily.      [name] warfarin (COUMADIN) 2.5 MG tablet Take 2.5 mg by mouth daily.         No Known Allergies  History   Social History  . Marital Status: Single    Spouse Name: N/A     Number of Children: 3  . Years of Education: N/A   Occupational History  . Not on file.   Social History Main Topics  . Smoking status: Former Games developer  . Smokeless tobacco: Never Used     Comment: remote history of tobacco abuse  . Alcohol Use: No  . Drug Use: No  . Sexually Active: Not Currently    Birth Control/ Protection: Surgical   Other Topics Concern  . Not on file   Social History Narrative   Lives with spouse who was recently diagnosed with esophageal ca and is receiving chemotherapy    Family History  Problem Relation Age of Onset  . Stroke Brother   . Stroke Mother     PHYSICAL EXAM: Filed Vitals:   04/05/13 1700  BP: 120/70  Pulse: 96  Temp:   Resp:    General:  Weak appearing. No respiratory difficulty HEENT: normal Neck: supple. JVD ~9-10. Carotids 2+ bilat; no bruits. No lymphadenopathy or thryomegaly appreciated. Cor:  PMI nondisplaced. Irregular rate & rhythm. No rubs,  or murmurs. + S3 Lungs: clear Abdomen: soft, nontender, nondistended. No hepatosplenomegaly. No bruits or masses. Good bowel sounds. Extremities: no cyanosis, clubbing, rash, edema Neuro: alert & oriented x 3, cranial  nerves grossly intact. moves all 4 extremities w/o difficulty. Affect pleasant.  ECG: SR with PVCs 90s  Results for orders placed during the hospital encounter of 04/05/13 (from the past 24 hour(s))  MRSA PCR SCREENING     Status: None   Collection Time    04/05/13 11:53 AM      Result Value Range   MRSA by PCR NEGATIVE  NEGATIVE  TYPE AND SCREEN     Status: None   Collection Time    04/05/13 12:20 PM      Result Value Range   ABO/RH(D) O POS     Antibody Screen NEG     Sample Expiration 04/08/2013     Unit Number [id number]     Blood Component Type RED CELLS,LR     Unit division 00     Status of Unit ALLOCATED     Transfusion Status OK TO TRANSFUSE     Crossmatch Result Compatible     Unit Number [id number]     Blood Component Type RED CELLS,LR      Unit division 00     Status of Unit ALLOCATED     Transfusion Status OK TO TRANSFUSE     Crossmatch Result Compatible     Unit Number [id number]     Blood Component Type RED CELLS,LR     Unit division 00     Status of Unit ALLOCATED     Transfusion Status OK TO TRANSFUSE     Crossmatch Result Compatible     Unit Number [id number]     Blood Component Type RED CELLS,LR     Unit division 00     Status of Unit ALLOCATED     Transfusion Status OK TO TRANSFUSE     Crossmatch Result Compatible    PREPARE RBC (CROSSMATCH)     Status: None   Collection Time    04/05/13 12:20 PM      Result Value Range   Order Confirmation ORDER PROCESSED BY BLOOD BANK    COMPREHENSIVE METABOLIC PANEL     Status: Abnormal   Collection Time    04/05/13 12:20 PM      Result Value Range   Sodium 141  135 - 145 mEq/L   Potassium 3.5  3.5 - 5.1 mEq/L   Chloride 106  96 - 112 mEq/L   CO2 25  19 - 32 mEq/L   Glucose, Bld 86  70 - 99 mg/dL   BUN 9  6 - 23 mg/dL   Creatinine, Ser 3.87 (*) 0.50 - 1.10 mg/dL   Calcium 9.3  8.4 - 56.4 mg/dL   Total Protein 7.3  6.0 - 8.3 g/dL   Albumin 3.1 (*) 3.5 - 5.2 g/dL   AST 17  0 - 37 U/L   ALT 11  0 - 35 U/L   Alkaline Phosphatase 90  39 - 117 U/L   Total Bilirubin 0.8  0.3 - 1.2 mg/dL   GFR calc non Af Amer 44 (*) >90 mL/min   GFR calc Af Amer 51 (*) >90 mL/min  MAGNESIUM     Status: None   Collection Time    04/05/13 12:20 PM      Result Value Range   Magnesium 1.8  1.5 - 2.5 mg/dL  PRO B NATRIURETIC PEPTIDE     Status: Abnormal   Collection Time    04/05/13 12:20 PM      Result Value Range   Pro B Natriuretic peptide (BNP) 3022.0 (*) 0 -  450 pg/mL  CBC WITH DIFFERENTIAL     Status: Abnormal   Collection Time    04/05/13 12:20 PM      Result Value Range   WBC 4.6  4.0 - 10.5 K/uL   RBC 3.95  3.87 - 5.11 MIL/uL   Hemoglobin 12.4  12.0 - 15.0 g/dL   HCT 95.6  21.3 - 08.6 %   MCV 92.7  78.0 - 100.0 fL   MCH 31.4  26.0 - 34.0 pg   MCHC 33.9  30.0  - 36.0 g/dL   RDW 57.8 (*) 46.9 - 62.9 %   Platelets 188  150 - 400 K/uL   Neutrophils Relative % 54  43 - 77 %   Neutro Abs 2.5  1.7 - 7.7 K/uL   Lymphocytes Relative 31  12 - 46 %   Lymphs Abs 1.4  0.7 - 4.0 K/uL   Monocytes Relative 11  3 - 12 %   Monocytes Absolute 0.5  0.1 - 1.0 K/uL   Eosinophils Relative 4  0 - 5 %   Eosinophils Absolute 0.2  0.0 - 0.7 K/uL   Basophils Relative 0  0 - 1 %   Basophils Absolute 0.0  0.0 - 0.1 K/uL  ABO/RH     Status: None   Collection Time    04/05/13 12:20 PM      Result Value Range   ABO/RH(D) O POS    PROTIME-INR     Status: None   Collection Time    04/05/13  1:46 PM      Result Value Range   Prothrombin Time 14.4  11.6 - 15.2 seconds   INR 1.14  0.00 - 1.49  ANTITHROMBIN III     Status: None   Collection Time    04/05/13  2:55 PM      Result Value Range   AntiThromb III Func 94  75 - 120 %  URINALYSIS, ROUTINE W REFLEX MICROSCOPIC     Status: None   Collection Time    04/05/13  4:41 PM      Result Value Range   Color, Urine YELLOW  YELLOW   APPearance CLEAR  CLEAR   Specific Gravity, Urine 1.007  1.005 - 1.030   pH 7.5  5.0 - 8.0   Glucose, UA NEGATIVE  NEGATIVE mg/dL   Hgb urine dipstick NEGATIVE  NEGATIVE   Bilirubin Urine NEGATIVE  NEGATIVE   Ketones, ur NEGATIVE  NEGATIVE mg/dL   Protein, ur NEGATIVE  NEGATIVE mg/dL   Urobilinogen, UA 1.0  0.0 - 1.0 mg/dL   Nitrite NEGATIVE  NEGATIVE   Leukocytes, UA NEGATIVE  NEGATIVE   Dg Chest 2 View  04/05/2013   *RADIOLOGY REPORT*  Clinical Data: Preop for ventricular assist device  CHEST - 2 VIEW  Comparison: 03/14/2013  Findings: Pacer / AICD device, leads right atrium right ventricle.  Midline trachea.  Moderate enlargement cardiopericardial silhouette, unchanged. No pleural effusion or pneumothorax.  No congestive failure.  Clear lungs.  Right jugular line is new in the interval terminates at the mid SVC.  IMPRESSION: Cardiomegaly, without congestive failure or acute disease.    Original Report Authenticated By: Jeronimo Greaves, M.D.     ASSESSMENT: 1) Chronic systolic HF with biventricular failure and low output physiology - on chronic Milrinone 0.375 mcg . 2) NICM -EF 15% Mild to moderate RV dysfunction  3) RV failure  4) PAF, in NSR on amio. Anticoagulated on coumadin  5) PAH    PLAN/DISCUSSION:  Brianna Hanson is a 78 year old with Class IV HF despite inotropic support due to biventricular dysfunction. She is admitted today for medical optimization. Plan for LVAD tomorrow. Continue Milrinone at 0.375 via hickman. Continue revatio 60 mg tid. Begin IV diuresis.  Check labs today.  VAD coordinator to obtain consents.    Rate controlled. Continue amiodarone.   Palliative Care to meeting today at 1:00  [name] 6:15 PM

## 2013-04-05 NOTE — Progress Notes (Signed)
This nurse speaks to Dr. Gala Romney. This nurse informs MD that potassium was 3.5, and 80mg  lasix given IV for diuresis and patient is having frequent PVC's. New orders to give of potassium oral now and of oral potassium in one hour. Will continue to monitor.

## 2013-04-05 NOTE — Addendum Note (Signed)
Encounter addended by: Dolores Patty, MD on: 04/05/2013  6:10 PM<BR>     Documentation filed: Notes Section

## 2013-04-05 NOTE — Progress Notes (Signed)
Anesthesiology Pre-op Note:  78 year old female with heart failure due severe non-ischemic cardiomyopathy on milrinone and revatio scheduled for Heartmate II LVAD tomorrow for destination therapy.  Problems:  1. Severe NICM EF 15%, LVEDD 59 mm, RV mild to moderately dilated by echo 02/02/13. Diagnosed 1999, cardiac arrest with V.Fib 2010 with Medtronic AICD placed 09/05/09. mild MR, no AI, mild TR. 2. H/O paroxysmal Afb now in SR on amiodarone, Previously on coumadin, last dose 7/30 INR 1.14 3. Htn  4. Plasma cell disorder most likely IgA MGUS  H/H 12.4/36.6 WBC 4,600 Platelets 188,000  Anesthetic plan discussed,questions answered. I explained that she will most likely need to remain intubated overnight following surgery.  Kipp Brood, MD

## 2013-04-06 ENCOUNTER — Inpatient Hospital Stay (HOSPITAL_COMMUNITY): Payer: Medicare Other

## 2013-04-06 ENCOUNTER — Encounter (HOSPITAL_COMMUNITY): Payer: Self-pay | Admitting: Certified Registered"

## 2013-04-06 ENCOUNTER — Encounter (HOSPITAL_COMMUNITY)
Admission: RE | Disposition: A | Discharge status: Home Health | Payer: Self-pay | Source: Ambulatory Visit | Attending: Surgery

## 2013-04-06 ENCOUNTER — Inpatient Hospital Stay (HOSPITAL_COMMUNITY): Payer: Medicare Other | Admitting: Certified Registered"

## 2013-04-06 DIAGNOSIS — I5023 Acute on chronic systolic (congestive) heart failure: Secondary | ICD-10-CM

## 2013-04-06 HISTORY — PX: INTRAOPERATIVE TRANSESOPHAGEAL ECHOCARDIOGRAM: SHX5062

## 2013-04-06 HISTORY — PX: INSERTION OF IMPLANTABLE LEFT VENTRICULAR ASSIST DEVICE: SHX5866

## 2013-04-06 HISTORY — PX: PLACEMENT OF CENTRIMAG VENTRICULAR ASSIST DEVICE: SHX6226

## 2013-04-06 LAB — POCT I-STAT 3, ART BLOOD GAS (G3+)
Acid-base deficit: 2 mmol/L (ref 0.0–2.0)
Acid-base deficit: 5 mmol/L — ABNORMAL HIGH (ref 0.0–2.0)
Acid-base deficit: 3 mmol/L — ABNORMAL HIGH (ref 0.0–2.0)
Acid-base deficit: 4 mmol/L — ABNORMAL HIGH (ref 0.0–2.0)
Bicarbonate: 22.9 mEq/L (ref 20.0–24.0)
Bicarbonate: 21.9 mEq/L (ref 20.0–24.0)
Bicarbonate: 23.3 mEq/L (ref 20.0–24.0)
Bicarbonate: 21.3 mEq/L (ref 20.0–24.0)
O2 Saturation: 100 %
O2 Saturation: 95 %
O2 Saturation: 97 %
O2 Saturation: 99 %
Patient temperature: 35.7
Patient temperature: 35.7
Patient temperature: 36.9
TCO2: 24 mmol/L (ref 0–100)
TCO2: 23 mmol/L (ref 0–100)
TCO2: 25 mmol/L (ref 0–100)
TCO2: 22 mmol/L (ref 0–100)
pCO2 arterial: 36.1 mmHg (ref 35.0–45.0)
pCO2 arterial: 45.3 mmHg — ABNORMAL HIGH (ref 35.0–45.0)
pCO2 arterial: 45.1 mmHg — ABNORMAL HIGH (ref 35.0–45.0)
pCO2 arterial: 38.3 mmHg (ref 35.0–45.0)
pH, Arterial: 7.41 (ref 7.350–7.450)
pH, Arterial: 7.286 — ABNORMAL LOW (ref 7.350–7.450)
pH, Arterial: 7.315 — ABNORMAL LOW (ref 7.350–7.450)
pH, Arterial: 7.353 (ref 7.350–7.450)
pO2, Arterial: 279 mmHg — ABNORMAL HIGH (ref 80.0–100.0)
pO2, Arterial: 82 mmHg (ref 80.0–100.0)
pO2, Arterial: 90 mmHg (ref 80.0–100.0)
pO2, Arterial: 145 mmHg — ABNORMAL HIGH (ref 80.0–100.0)

## 2013-04-06 LAB — CARDIOLIPIN ANTIBODIES, IGG, IGM, IGA
Anticardiolipin IgA: 10 APL U/mL — ABNORMAL LOW (ref <22)
Anticardiolipin IgG: 5 GPL U/mL — ABNORMAL LOW (ref <23)
Anticardiolipin IgM: 0 MPL U/mL — ABNORMAL LOW (ref <11)

## 2013-04-06 LAB — BETA-2-GLYCOPROTEIN I ABS, IGG/M/A
Beta-2 Glyco I IgG: 0 G Units (ref <20)
Beta-2-Glycoprotein I IgA: 0 A Units (ref <20)
Beta-2-Glycoprotein I IgM: 0 M Units (ref <20)

## 2013-04-06 LAB — BASIC METABOLIC PANEL
BUN: 11 mg/dL (ref 6–23)
BUN: 9 mg/dL (ref 6–23)
CO2: 21 mEq/L (ref 19–32)
CO2: 22 mEq/L (ref 19–32)
Calcium: 9.5 mg/dL (ref 8.4–10.5)
Calcium: 8.4 mg/dL (ref 8.4–10.5)
Chloride: 107 mEq/L (ref 96–112)
Chloride: 112 mEq/L (ref 96–112)
Creatinine, Ser: 1.1 mg/dL (ref 0.50–1.10)
Creatinine, Ser: 0.9 mg/dL (ref 0.50–1.10)
GFR calc Af Amer: 55 mL/min — ABNORMAL LOW (ref >90)
GFR calc Af Amer: 70 mL/min — ABNORMAL LOW (ref >90)
GFR calc non Af Amer: 48 mL/min — ABNORMAL LOW (ref >90)
GFR calc non Af Amer: 61 mL/min — ABNORMAL LOW (ref >90)
Glucose, Bld: 90 mg/dL (ref 70–99)
Glucose, Bld: 118 mg/dL — ABNORMAL HIGH (ref 70–99)
Potassium: 4 mEq/L (ref 3.5–5.1)
Potassium: 2.5 mEq/L — CL (ref 3.5–5.1)
Sodium: 140 mEq/L (ref 135–145)
Sodium: 145 mEq/L (ref 135–145)

## 2013-04-06 LAB — CBC
HCT: 38.3 % (ref 36.0–46.0)
HCT: 25 % — ABNORMAL LOW (ref 36.0–46.0)
HCT: 25.8 % — ABNORMAL LOW (ref 36.0–46.0)
Hemoglobin: 12.7 g/dL (ref 12.0–15.0)
Hemoglobin: 8.5 g/dL — ABNORMAL LOW (ref 12.0–15.0)
Hemoglobin: 8.7 g/dL — ABNORMAL LOW (ref 12.0–15.0)
MCH: 30.5 pg (ref 26.0–34.0)
MCH: 31.5 pg (ref 26.0–34.0)
MCH: 31.4 pg (ref 26.0–34.0)
MCHC: 33.2 g/dL (ref 30.0–36.0)
MCHC: 34 g/dL (ref 30.0–36.0)
MCHC: 33.7 g/dL (ref 30.0–36.0)
MCV: 91.8 fL (ref 78.0–100.0)
MCV: 92.6 fL (ref 78.0–100.0)
MCV: 93.1 fL (ref 78.0–100.0)
Platelets: 191 10*3/uL (ref 150–400)
Platelets: 123 10*3/uL — ABNORMAL LOW (ref 150–400)
Platelets: 148 10*3/uL — ABNORMAL LOW (ref 150–400)
RBC: 4.17 MIL/uL (ref 3.87–5.11)
RBC: 2.7 MIL/uL — ABNORMAL LOW (ref 3.87–5.11)
RBC: 2.77 MIL/uL — ABNORMAL LOW (ref 3.87–5.11)
RDW: 17.5 % — ABNORMAL HIGH (ref 11.5–15.5)
RDW: 17.5 % — ABNORMAL HIGH (ref 11.5–15.5)
RDW: 17.5 % — ABNORMAL HIGH (ref 11.5–15.5)
WBC: 6.1 10*3/uL (ref 4.0–10.5)
WBC: 10.8 10*3/uL — ABNORMAL HIGH (ref 4.0–10.5)
WBC: 11.6 10*3/uL — ABNORMAL HIGH (ref 4.0–10.5)

## 2013-04-06 LAB — CARBOXYHEMOGLOBIN
Carboxyhemoglobin: 1.6 % — ABNORMAL HIGH (ref 0.5–1.5)
Carboxyhemoglobin: 1.6 % — ABNORMAL HIGH (ref 0.5–1.5)
Carboxyhemoglobin: 1.8 % — ABNORMAL HIGH (ref 0.5–1.5)
Methemoglobin: 1.1 % (ref 0.0–1.5)
Methemoglobin: 2.1 % — ABNORMAL HIGH (ref 0.0–1.5)
Methemoglobin: 1.4 % (ref 0.0–1.5)
O2 Saturation: 100 %
O2 Saturation: 99.7 %
O2 Saturation: 75.1 %
Total hemoglobin: 7.8 g/dL — ABNORMAL LOW (ref 12.0–16.0)
Total hemoglobin: 10 g/dL — ABNORMAL LOW (ref 12.0–16.0)
Total hemoglobin: 8.5 g/dL — ABNORMAL LOW (ref 12.0–16.0)

## 2013-04-06 LAB — PROTHROMBIN GENE MUTATION

## 2013-04-06 LAB — POCT I-STAT 3, VENOUS BLOOD GAS (G3P V)
Acid-base deficit: 2 mmol/L (ref 0.0–2.0)
Bicarbonate: 21.2 mEq/L (ref 20.0–24.0)
O2 Saturation: 100 %
Patient temperature: 35.7
TCO2: 22 mmol/L (ref 0–100)
pCO2, Ven: 28.6 mmHg — ABNORMAL LOW (ref 45.0–50.0)
pH, Ven: 7.474 — ABNORMAL HIGH (ref 7.250–7.300)
pO2, Ven: 234 mmHg — ABNORMAL HIGH (ref 30.0–45.0)

## 2013-04-06 LAB — POCT I-STAT, CHEM 8
BUN: 7 mg/dL (ref 6–23)
BUN: 7 mg/dL (ref 6–23)
Calcium, Ion: 1.2 mmol/L (ref 1.13–1.30)
Calcium, Ion: 1.23 mmol/L (ref 1.13–1.30)
Chloride: 109 mEq/L (ref 96–112)
Chloride: 112 mEq/L (ref 96–112)
Creatinine, Ser: 0.9 mg/dL (ref 0.50–1.10)
Creatinine, Ser: 1.1 mg/dL (ref 0.50–1.10)
Glucose, Bld: 115 mg/dL — ABNORMAL HIGH (ref 70–99)
Glucose, Bld: 144 mg/dL — ABNORMAL HIGH (ref 70–99)
HCT: 27 % — ABNORMAL LOW (ref 36.0–46.0)
HCT: 25 % — ABNORMAL LOW (ref 36.0–46.0)
Hemoglobin: 9.2 g/dL — ABNORMAL LOW (ref 12.0–15.0)
Hemoglobin: 8.5 g/dL — ABNORMAL LOW (ref 12.0–15.0)
Potassium: 2.5 mEq/L — CL (ref 3.5–5.1)
Potassium: 3.3 mEq/L — ABNORMAL LOW (ref 3.5–5.1)
Sodium: 147 mEq/L — ABNORMAL HIGH (ref 135–145)
Sodium: 148 mEq/L — ABNORMAL HIGH (ref 135–145)
TCO2: 21 mmol/L (ref 0–100)
TCO2: 24 mmol/L (ref 0–100)

## 2013-04-06 LAB — POCT I-STAT 4, (NA,K, GLUC, HGB,HCT)
Glucose, Bld: 105 mg/dL — ABNORMAL HIGH (ref 70–99)
Glucose, Bld: 90 mg/dL (ref 70–99)
Glucose, Bld: 91 mg/dL (ref 70–99)
Glucose, Bld: 75 mg/dL (ref 70–99)
Glucose, Bld: 200 mg/dL — ABNORMAL HIGH (ref 70–99)
HCT: 39 % (ref 36.0–46.0)
HCT: 33 % — ABNORMAL LOW (ref 36.0–46.0)
HCT: 29 % — ABNORMAL LOW (ref 36.0–46.0)
HCT: 22 % — ABNORMAL LOW (ref 36.0–46.0)
HCT: 25 % — ABNORMAL LOW (ref 36.0–46.0)
Hemoglobin: 13.3 g/dL (ref 12.0–15.0)
Hemoglobin: 11.2 g/dL — ABNORMAL LOW (ref 12.0–15.0)
Hemoglobin: 9.9 g/dL — ABNORMAL LOW (ref 12.0–15.0)
Hemoglobin: 7.5 g/dL — ABNORMAL LOW (ref 12.0–15.0)
Hemoglobin: 8.5 g/dL — ABNORMAL LOW (ref 12.0–15.0)
Potassium: 3.9 mEq/L (ref 3.5–5.1)
Potassium: 3.6 mEq/L (ref 3.5–5.1)
Potassium: 3.4 mEq/L — ABNORMAL LOW (ref 3.5–5.1)
Potassium: 3.6 mEq/L (ref 3.5–5.1)
Potassium: 2.9 mEq/L — ABNORMAL LOW (ref 3.5–5.1)
Sodium: 144 mEq/L (ref 135–145)
Sodium: 144 mEq/L (ref 135–145)
Sodium: 138 mEq/L (ref 135–145)
Sodium: 145 mEq/L (ref 135–145)
Sodium: 142 mEq/L (ref 135–145)

## 2013-04-06 LAB — DIC (DISSEMINATED INTRAVASCULAR COAGULATION)PANEL
D-Dimer, Quant: 1.18 ug/mL-FEU — ABNORMAL HIGH (ref 0.00–0.48)
Fibrinogen: 267 mg/dL (ref 204–475)
Platelets: 122 10*3/uL — ABNORMAL LOW (ref 150–400)
Smear Review: NONE SEEN

## 2013-04-06 LAB — LUPUS ANTICOAGULANT PANEL
DRVVT: 31.3 secs (ref <42.9)
Lupus Anticoagulant: NOT DETECTED
PTT Lupus Anticoagulant: 32.7 secs (ref 28.0–43.0)

## 2013-04-06 LAB — DIC (DISSEMINATED INTRAVASCULAR COAGULATION) PANEL (NOT AT ARMC)
INR: 1.59 — ABNORMAL HIGH (ref 0.00–1.49)
Prothrombin Time: 18.5 seconds — ABNORMAL HIGH (ref 11.6–15.2)
aPTT: 39 seconds — ABNORMAL HIGH (ref 24–37)

## 2013-04-06 LAB — CALCIUM, IONIZED: Calcium, Ion: 1.17 mmol/L (ref 1.13–1.30)

## 2013-04-06 LAB — PROTEIN S ACTIVITY: Protein S Activity: 70 % (ref 69–129)

## 2013-04-06 LAB — GLUCOSE, CAPILLARY
Glucose-Capillary: 79 mg/dL (ref 70–99)
Glucose-Capillary: 104 mg/dL — ABNORMAL HIGH (ref 70–99)
Glucose-Capillary: 140 mg/dL — ABNORMAL HIGH (ref 70–99)

## 2013-04-06 LAB — CREATININE, SERUM
Creatinine, Ser: 0.98 mg/dL (ref 0.50–1.10)
GFR calc Af Amer: 63 mL/min — ABNORMAL LOW (ref >90)
GFR calc non Af Amer: 55 mL/min — ABNORMAL LOW (ref >90)

## 2013-04-06 LAB — PREPARE RBC (CROSSMATCH)

## 2013-04-06 LAB — HOMOCYSTEINE: Homocysteine: 16.3 umol/L — ABNORMAL HIGH (ref 4.0–15.4)

## 2013-04-06 LAB — HEMOGLOBIN AND HEMATOCRIT, BLOOD
HCT: 21.6 % — ABNORMAL LOW (ref 36.0–46.0)
Hemoglobin: 7.5 g/dL — ABNORMAL LOW (ref 12.0–15.0)

## 2013-04-06 LAB — FACTOR 5 LEIDEN

## 2013-04-06 LAB — PLATELET COUNT: Platelets: 94 10*3/uL — ABNORMAL LOW (ref 150–400)

## 2013-04-06 LAB — MAGNESIUM
Magnesium: 1.7 mg/dL (ref 1.5–2.5)
Magnesium: 3 mg/dL — ABNORMAL HIGH (ref 1.5–2.5)

## 2013-04-06 LAB — PROTEIN C ACTIVITY: Protein C Activity: 146 % — ABNORMAL HIGH (ref 75–133)

## 2013-04-06 LAB — PREALBUMIN: Prealbumin: 17.1 mg/dL — ABNORMAL LOW (ref 17.0–34.0)

## 2013-04-06 SURGERY — INSERTION OF IMPLANTABLE LEFT VENTRICULAR ASSIST DEVICE
Anesthesia: General | Site: Chest | Laterality: Right | Wound class: Clean

## 2013-04-06 MED ORDER — POTASSIUM CHLORIDE 10 MEQ/50ML IV SOLN
10.0000 meq | INTRAVENOUS | Status: AC
  Administered 2013-04-06 (×3): 10 meq via INTRAVENOUS

## 2013-04-06 MED ORDER — INSULIN REGULAR BOLUS VIA INFUSION
0.0000 [IU] | Freq: Three times a day (TID) | INTRAVENOUS | Status: DC
  Filled 2013-04-06: qty 10

## 2013-04-06 MED ORDER — ROCURONIUM BROMIDE 100 MG/10ML IV SOLN
INTRAVENOUS | Status: DC | PRN
  Administered 2013-04-06 (×2): 50 mg via INTRAVENOUS

## 2013-04-06 MED ORDER — ARTIFICIAL TEARS OP OINT
TOPICAL_OINTMENT | OPHTHALMIC | Status: DC | PRN
  Administered 2013-04-06: 1 via OPHTHALMIC

## 2013-04-06 MED ORDER — LACTATED RINGERS IV SOLN
INTRAVENOUS | Status: DC | PRN
  Administered 2013-04-06: 07:00:00 via INTRAVENOUS

## 2013-04-06 MED ORDER — ACETAMINOPHEN 160 MG/5ML PO SOLN
1000.0000 mg | Freq: Four times a day (QID) | ORAL | Status: DC
  Administered 2013-04-07: 1000 mg
  Filled 2013-04-06: qty 40.6

## 2013-04-06 MED ORDER — FENTANYL CITRATE 0.05 MG/ML IJ SOLN
INTRAMUSCULAR | Status: DC | PRN
  Administered 2013-04-06 (×2): 250 ug via INTRAVENOUS
  Administered 2013-04-06: 150 ug via INTRAVENOUS
  Administered 2013-04-06: 50 ug via INTRAVENOUS
  Administered 2013-04-06 (×2): 250 ug via INTRAVENOUS
  Administered 2013-04-06: 100 ug via INTRAVENOUS

## 2013-04-06 MED ORDER — VANCOMYCIN HCL 1000 MG IV SOLR
INTRAVENOUS | Status: DC | PRN
  Administered 2013-04-06: 1000 mg

## 2013-04-06 MED ORDER — VECURONIUM BROMIDE 10 MG IV SOLR
INTRAVENOUS | Status: DC | PRN
  Administered 2013-04-06 (×2): 5 mg via INTRAVENOUS

## 2013-04-06 MED ORDER — ACETAMINOPHEN 500 MG PO TABS
1000.0000 mg | ORAL_TABLET | Freq: Four times a day (QID) | ORAL | Status: DC
  Administered 2013-04-07 – 2013-04-09 (×6): 1000 mg via ORAL
  Filled 2013-04-06 (×12): qty 2

## 2013-04-06 MED ORDER — PROTAMINE SULFATE 10 MG/ML IV SOLN
INTRAVENOUS | Status: DC | PRN
  Administered 2013-04-06: 20 mg via INTRAVENOUS
  Administered 2013-04-06: 30 mg via INTRAVENOUS
  Administered 2013-04-06: 50 mg via INTRAVENOUS
  Administered 2013-04-06: 30 mg via INTRAVENOUS
  Administered 2013-04-06: 50 mg via INTRAVENOUS
  Administered 2013-04-06: 20 mg via INTRAVENOUS
  Administered 2013-04-06: 50 mg via INTRAVENOUS

## 2013-04-06 MED ORDER — BISACODYL 5 MG PO TBEC
10.0000 mg | DELAYED_RELEASE_TABLET | Freq: Every day | ORAL | Status: DC
  Administered 2013-04-08 – 2013-04-18 (×5): 10 mg via ORAL
  Filled 2013-04-06 (×5): qty 2

## 2013-04-06 MED ORDER — NOREPINEPHRINE BITARTRATE 1 MG/ML IJ SOLN
3.0000 ug/min | INTRAVENOUS | Status: DC
  Filled 2013-04-06: qty 4

## 2013-04-06 MED ORDER — 0.9 % SODIUM CHLORIDE (POUR BTL) OPTIME
TOPICAL | Status: DC | PRN
  Administered 2013-04-06: 6000 mL

## 2013-04-06 MED ORDER — LACTATED RINGERS IV SOLN
INTRAVENOUS | Status: DC
  Administered 2013-04-07 – 2013-04-08 (×2): via INTRAVENOUS

## 2013-04-06 MED ORDER — PHENYLEPHRINE HCL 10 MG/ML IJ SOLN
INTRAMUSCULAR | Status: DC | PRN
  Administered 2013-04-06 (×3): 40 ug via INTRAVENOUS

## 2013-04-06 MED ORDER — LACTATED RINGERS IV SOLN
INTRAVENOUS | Status: DC | PRN
  Administered 2013-04-06: 09:00:00 via INTRAVENOUS

## 2013-04-06 MED ORDER — LACTATED RINGERS IV SOLN
INTRAVENOUS | Status: DC | PRN
  Administered 2013-04-06 (×2): via INTRAVENOUS

## 2013-04-06 MED ORDER — OXYCODONE HCL 5 MG PO TABS: 5.0000 mg | ORAL_TABLET | ORAL | Status: DC | PRN

## 2013-04-06 MED ORDER — VANCOMYCIN HCL 1000 MG IV SOLR
INTRAVENOUS | Status: AC
  Filled 2013-04-06: qty 2000

## 2013-04-06 MED ORDER — MORPHINE SULFATE 2 MG/ML IJ SOLN
2.0000 mg | INTRAMUSCULAR | Status: DC | PRN
Start: 2013-04-06 — End: 2013-04-10
  Administered 2013-04-07 – 2013-04-10 (×7): 2 mg via INTRAVENOUS
  Filled 2013-04-06 (×7): qty 1

## 2013-04-06 MED ORDER — MIDAZOLAM HCL 2 MG/2ML IJ SOLN: 2.0000 mg | INTRAMUSCULAR | Status: DC | PRN

## 2013-04-06 MED ORDER — ACETAMINOPHEN 160 MG/5ML PO SOLN: 650.0000 mg | Freq: Once | ORAL | Status: AC

## 2013-04-06 MED ORDER — SODIUM CHLORIDE 0.9 % IV SOLN: INTRAVENOUS | Status: DC

## 2013-04-06 MED ORDER — ONDANSETRON HCL 4 MG/2ML IJ SOLN
4.0000 mg | Freq: Four times a day (QID) | INTRAMUSCULAR | Status: DC | PRN
  Administered 2013-04-08: 4 mg via INTRAVENOUS
  Filled 2013-04-06: qty 2

## 2013-04-06 MED ORDER — MAGNESIUM SULFATE 40 MG/ML IJ SOLN
4.0000 g | Freq: Once | INTRAMUSCULAR | Status: AC
  Administered 2013-04-06: 4 g via INTRAVENOUS
  Filled 2013-04-06: qty 100

## 2013-04-06 MED ORDER — HEPARIN SODIUM (PORCINE) 1000 UNIT/ML IJ SOLN
INTRAMUSCULAR | Status: DC | PRN
  Administered 2013-04-06: 29000 [IU] via INTRAVENOUS

## 2013-04-06 MED ORDER — SODIUM CHLORIDE 0.9 % IJ SOLN: 3.0000 mL | INTRAMUSCULAR | Status: DC | PRN

## 2013-04-06 MED ORDER — MORPHINE SULFATE 2 MG/ML IJ SOLN: 1.0000 mg | INTRAMUSCULAR | Status: AC | PRN

## 2013-04-06 MED ORDER — DOCUSATE SODIUM 100 MG PO CAPS
200.0000 mg | ORAL_CAPSULE | Freq: Every day | ORAL | Status: DC
  Administered 2013-04-08 – 2013-04-22 (×10): 200 mg via ORAL
  Filled 2013-04-06 (×13): qty 2

## 2013-04-06 MED ORDER — ASPIRIN 81 MG PO CHEW: 324.0000 mg | CHEWABLE_TABLET | Freq: Every day | ORAL | Status: DC

## 2013-04-06 MED ORDER — ASPIRIN 300 MG RE SUPP
300.0000 mg | Freq: Every day | RECTAL | Status: DC
  Filled 2013-04-06 (×4): qty 1

## 2013-04-06 MED ORDER — NITROGLYCERIN IN D5W 200-5 MCG/ML-% IV SOLN: 0.0000 ug/min | INTRAVENOUS | Status: DC

## 2013-04-06 MED ORDER — POTASSIUM CHLORIDE 10 MEQ/50ML IV SOLN
10.0000 meq | Freq: Once | INTRAVENOUS | Status: AC
  Administered 2013-04-06: 10 meq via INTRAVENOUS

## 2013-04-06 MED ORDER — ALBUMIN HUMAN 5 % IV SOLN
INTRAVENOUS | Status: DC | PRN
  Administered 2013-04-06 (×5): via INTRAVENOUS

## 2013-04-06 MED ORDER — SODIUM CHLORIDE 0.9 % IV SOLN
INTRAVENOUS | Status: DC | PRN
  Administered 2013-04-06: 1000 mL via INTRAMUSCULAR

## 2013-04-06 MED ORDER — ASPIRIN EC 325 MG PO TBEC
325.0000 mg | DELAYED_RELEASE_TABLET | Freq: Every day | ORAL | Status: DC
  Administered 2013-04-08 – 2013-04-17 (×10): 325 mg via ORAL
  Filled 2013-04-06 (×13): qty 1

## 2013-04-06 MED ORDER — ACETAMINOPHEN 650 MG RE SUPP
650.0000 mg | Freq: Once | RECTAL | Status: AC
  Administered 2013-04-06: 650 mg via RECTAL

## 2013-04-06 MED ORDER — PHENYLEPHRINE HCL 10 MG/ML IJ SOLN
0.0000 ug/min | INTRAVENOUS | Status: DC
  Filled 2013-04-06: qty 2

## 2013-04-06 MED ORDER — LACTATED RINGERS IV SOLN: 500.0000 mL | Freq: Once | INTRAVENOUS | Status: AC | PRN

## 2013-04-06 MED ORDER — SODIUM BICARBONATE 8.4 % IV SOLN
50.0000 meq | Freq: Once | INTRAVENOUS | Status: AC
  Administered 2013-04-06: 50 meq via INTRAVENOUS
  Filled 2013-04-06: qty 50

## 2013-04-06 MED ORDER — ALBUMIN HUMAN 5 % IV SOLN
250.0000 mL | INTRAVENOUS | Status: AC | PRN
  Administered 2013-04-06 – 2013-04-07 (×2): 250 mL via INTRAVENOUS

## 2013-04-06 MED ORDER — DEXMEDETOMIDINE HCL IN NACL 200 MCG/50ML IV SOLN
0.1000 ug/kg/h | INTRAVENOUS | Status: DC
  Administered 2013-04-06 – 2013-04-07 (×3): 0.7 ug/kg/h via INTRAVENOUS
  Filled 2013-04-06 (×2): qty 50
  Filled 2013-04-06: qty 100

## 2013-04-06 MED ORDER — FAMOTIDINE IN NACL 20-0.9 MG/50ML-% IV SOLN
20.0000 mg | Freq: Two times a day (BID) | INTRAVENOUS | Status: AC
  Administered 2013-04-06 (×2): 20 mg via INTRAVENOUS
  Filled 2013-04-06: qty 50

## 2013-04-06 MED ORDER — SODIUM CHLORIDE 0.9 % IV SOLN
10.0000 g | INTRAVENOUS | Status: DC | PRN
  Administered 2013-04-06: 5 g/h via INTRAVENOUS

## 2013-04-06 MED ORDER — SODIUM CHLORIDE 0.9 % IV SOLN: 250.0000 mL | INTRAVENOUS | Status: DC

## 2013-04-06 MED ORDER — MILRINONE IN DEXTROSE 20 MG/100ML IV SOLN
0.1250 ug/kg/min | INTRAVENOUS | Status: DC
  Administered 2013-04-06 – 2013-04-08 (×4): 0.375 ug/kg/min via INTRAVENOUS
  Administered 2013-04-09: 0.25 ug/kg/min via INTRAVENOUS
  Administered 2013-04-09: 0.375 ug/kg/min via INTRAVENOUS
  Administered 2013-04-10: 0.25 ug/kg/min via INTRAVENOUS
  Filled 2013-04-06 (×7): qty 100

## 2013-04-06 MED ORDER — RIFAMPIN 300 MG PO CAPS
600.0000 mg | ORAL_CAPSULE | Freq: Once | ORAL | Status: DC
  Filled 2013-04-06: qty 2

## 2013-04-06 MED ORDER — EPINEPHRINE HCL 1 MG/ML IJ SOLN
0.5000 ug/min | INTRAVENOUS | Status: DC
  Filled 2013-04-06: qty 4

## 2013-04-06 MED ORDER — MIDAZOLAM HCL 5 MG/5ML IJ SOLN
INTRAMUSCULAR | Status: DC | PRN
  Administered 2013-04-06: 3 mg via INTRAVENOUS
  Administered 2013-04-06: 2 mg via INTRAVENOUS
  Administered 2013-04-06: 5 mg via INTRAVENOUS
  Administered 2013-04-06: 2 mg via INTRAVENOUS
  Administered 2013-04-06 (×2): 3 mg via INTRAVENOUS
  Administered 2013-04-06: 2 mg via INTRAVENOUS

## 2013-04-06 MED ORDER — VANCOMYCIN HCL IN DEXTROSE 1-5 GM/200ML-% IV SOLN
1000.0000 mg | INTRAVENOUS | Status: AC
  Administered 2013-04-06 – 2013-04-07 (×2): 1000 mg via INTRAVENOUS
  Filled 2013-04-06 (×2): qty 200

## 2013-04-06 MED ORDER — ETOMIDATE 2 MG/ML IV SOLN
INTRAVENOUS | Status: DC | PRN
  Administered 2013-04-06: 12 mg via INTRAVENOUS

## 2013-04-06 MED ORDER — SODIUM CHLORIDE 0.9 % IJ SOLN
3.0000 mL | Freq: Two times a day (BID) | INTRAMUSCULAR | Status: DC
  Administered 2013-04-07 – 2013-04-11 (×9): 3 mL via INTRAVENOUS

## 2013-04-06 MED ORDER — FLUCONAZOLE IN SODIUM CHLORIDE 400-0.9 MG/200ML-% IV SOLN
400.0000 mg | Freq: Once | INTRAVENOUS | Status: AC
  Administered 2013-04-07: 400 mg via INTRAVENOUS
  Filled 2013-04-06 (×2): qty 200

## 2013-04-06 MED ORDER — DEXTROSE 5 % IV SOLN
1.5000 g | Freq: Two times a day (BID) | INTRAVENOUS | Status: AC
  Administered 2013-04-06 – 2013-04-08 (×4): 1.5 g via INTRAVENOUS
  Filled 2013-04-06 (×5): qty 1.5

## 2013-04-06 MED ORDER — SODIUM CHLORIDE 0.9 % IR SOLN
Status: DC | PRN
  Administered 2013-04-06: 08:00:00

## 2013-04-06 MED ORDER — SODIUM CHLORIDE 0.45 % IV SOLN
INTRAVENOUS | Status: DC
  Administered 2013-04-09: 15:00:00 via INTRAVENOUS

## 2013-04-06 MED ORDER — PANTOPRAZOLE SODIUM 40 MG PO TBEC
40.0000 mg | DELAYED_RELEASE_TABLET | Freq: Every day | ORAL | Status: DC
  Administered 2013-04-08 – 2013-04-22 (×15): 40 mg via ORAL
  Filled 2013-04-06 (×14): qty 1

## 2013-04-06 MED ORDER — BISACODYL 10 MG RE SUPP: 10.0000 mg | Freq: Every day | RECTAL | Status: DC

## 2013-04-06 MED ORDER — SODIUM CHLORIDE 0.9 % IV SOLN
INTRAVENOUS | Status: DC
  Filled 2013-04-06 (×2): qty 1

## 2013-04-06 SURGICAL SUPPLY — 124 items
ADAPTER CARDIO PERF ANTE/RETRO (ADAPTER) ×4 IMPLANT
ADAPTER DLP PERFUSION .25INX2I (MISCELLANEOUS) ×4 IMPLANT
ADH SKN CLS APL DERMABOND .7 (GAUZE/BANDAGES/DRESSINGS) ×3
ADPR CRDPLG .25X.64 STRL (MISCELLANEOUS) ×3
ADPR PRFSN 84XANTGRD RTRGD (ADAPTER) ×3
APL SRG 7X2 LUM MLBL SLNT (VASCULAR PRODUCTS) ×3
APPLICATOR CHLORAPREP 3ML ORNG (MISCELLANEOUS) ×4 IMPLANT
APPLICATOR TIP COSEAL (VASCULAR PRODUCTS) ×1 IMPLANT
ARTERIAL PRESSURE LINE (MISCELLANEOUS) ×2 IMPLANT
ATTRACTOMAT 16X20 MAGNETIC DRP (DRAPES) ×4 IMPLANT
BAG DECANTER FOR FLEXI CONT (MISCELLANEOUS) ×10 IMPLANT
BLADE STERNUM SYSTEM 6 (BLADE) ×4 IMPLANT
BLADE SURG 10 STRL SS (BLADE) ×5 IMPLANT
BLADE SURG 12 STRL SS (BLADE) ×4 IMPLANT
BLADE SURG 15 STRL LF DISP TIS (BLADE) ×3 IMPLANT
BLADE SURG 15 STRL SS (BLADE) ×4
CANISTER SUCTION 2500CC (MISCELLANEOUS) ×4 IMPLANT
CANNULA ARTERIAL NVNT 3/8 20FR (MISCELLANEOUS) ×4 IMPLANT
CANNULA ARTERIAL VENT 3/8 20FR (CANNULA) ×1 IMPLANT
CANNULA VENOUS LOW PROF 34X46 (CANNULA) ×4 IMPLANT
CATH CPB KIT VANTRIGT (MISCELLANEOUS) IMPLANT
CATH FOLEY 2WAY SLVR  5CC 14FR (CATHETERS) ×1
CATH FOLEY 2WAY SLVR 5CC 14FR (CATHETERS) ×3 IMPLANT
CATH HEART VENT LEFT (CATHETERS) ×3 IMPLANT
CATH HYDRAGLIDE XL THORACIC (CATHETERS) ×4 IMPLANT
CATH ROBINSON RED A/P 18FR (CATHETERS) ×8 IMPLANT
CATH THORACIC 28FR (CATHETERS) IMPLANT
CATH THORACIC 36FR (CATHETERS) ×1 IMPLANT
CATH THORACIC 36FR RT ANG (CATHETERS) IMPLANT
CHLORAPREP W/TINT 26ML (MISCELLANEOUS) ×4 IMPLANT
CLOTH BEACON ORANGE TIMEOUT ST (SAFETY) ×4 IMPLANT
CONN ST 1/4X3/8  BEN (MISCELLANEOUS) ×2
CONN ST 1/4X3/8 BEN (MISCELLANEOUS) ×6 IMPLANT
CONT SPEC 4OZ CLIKSEAL STRL BL (MISCELLANEOUS) ×1 IMPLANT
COVER MAYO STAND STRL (DRAPES) ×1 IMPLANT
COVER SURGICAL LIGHT HANDLE (MISCELLANEOUS) ×8 IMPLANT
CRADLE DONUT ADULT HEAD (MISCELLANEOUS) ×4 IMPLANT
DERMABOND ADVANCED (GAUZE/BANDAGES/DRESSINGS) ×1
DERMABOND ADVANCED .7 DNX12 (GAUZE/BANDAGES/DRESSINGS) IMPLANT
DRAIN CHANNEL 28F RND 3/8 FF (WOUND CARE) IMPLANT
DRAIN CHANNEL 32F RND 10.7 FF (WOUND CARE) ×2 IMPLANT
DRAPE BILATERAL SPLIT (DRAPES) ×4 IMPLANT
DRAPE CARDIOVASCULAR INCISE (DRAPES) ×4
DRAPE INCISE IOBAN 66X45 STRL (DRAPES) ×4 IMPLANT
DRAPE PROXIMA HALF (DRAPES) ×1 IMPLANT
DRAPE SLUSH/WARMER DISC (DRAPES) ×4 IMPLANT
DRAPE SRG 135X102X78XABS (DRAPES) ×3 IMPLANT
DRSG AQUACEL AG ADV 3.5X10 (GAUZE/BANDAGES/DRESSINGS) ×2 IMPLANT
DRSG COVADERM 4X14 (GAUZE/BANDAGES/DRESSINGS) ×4 IMPLANT
ELECT BLADE 4.0 EZ CLEAN MEGAD (MISCELLANEOUS) ×4
ELECT BLADE 6.5 EXT (BLADE) ×4 IMPLANT
ELECT CAUTERY BLADE 6.4 (BLADE) ×4 IMPLANT
ELECT PAD GROUND ADT 9 (MISCELLANEOUS) ×8 IMPLANT
ELECT REM PT RETURN 9FT ADLT (ELECTROSURGICAL) ×4
ELECTRODE BLDE 4.0 EZ CLN MEGD (MISCELLANEOUS) ×3 IMPLANT
ELECTRODE REM PT RTRN 9FT ADLT (ELECTROSURGICAL) ×3 IMPLANT
FELT TEFLON 6X6 (MISCELLANEOUS) ×4 IMPLANT
GLOVE BIO SURGEON STRL SZ 6.5 (GLOVE) ×4 IMPLANT
GLOVE BIOGEL M 6.5 STRL (GLOVE) ×3 IMPLANT
GLOVE BIOGEL PI IND STRL 6 (GLOVE) IMPLANT
GLOVE BIOGEL PI IND STRL 7.0 (GLOVE) IMPLANT
GLOVE BIOGEL PI INDICATOR 6 (GLOVE) ×3
GLOVE BIOGEL PI INDICATOR 7.0 (GLOVE) ×4
GOWN PREVENTION PLUS XLARGE (GOWN DISPOSABLE) ×8 IMPLANT
GOWN STRL NON-REIN LRG LVL3 (GOWN DISPOSABLE) ×16 IMPLANT
HEMOSTAT POWDER SURGIFOAM 1G (HEMOSTASIS) ×15 IMPLANT
HEMOSTAT SURGICEL 2X14 (HEMOSTASIS) ×4 IMPLANT
HM II SYSTEM CONTROLLER ×1 IMPLANT
HM II Ventr Assist Device BP (Prosthesis & Implant Heart) ×1 IMPLANT
INSERT FOGARTY XLG (MISCELLANEOUS) ×4 IMPLANT
KIT BASIN OR (CUSTOM PROCEDURE TRAY) ×4 IMPLANT
KIT ROOM TURNOVER OR (KITS) ×4 IMPLANT
KIT SUCTION CATH 14FR (SUCTIONS) ×5 IMPLANT
LEAD PACING MYOCARDI (MISCELLANEOUS) ×9 IMPLANT
LINE VENT (MISCELLANEOUS) ×1 IMPLANT
NS IRRIG 1000ML POUR BTL (IV SOLUTION) ×20 IMPLANT
PACK OPEN HEART (CUSTOM PROCEDURE TRAY) ×4 IMPLANT
PAD ARMBOARD 7.5X6 YLW CONV (MISCELLANEOUS) ×8 IMPLANT
PAD DEFIB R2 (MISCELLANEOUS) ×4 IMPLANT
PUNCH AORTIC ROTATE 4.5MM 8IN (MISCELLANEOUS) ×4 IMPLANT
SEALANT SURG COSEAL 8ML (VASCULAR PRODUCTS) ×8 IMPLANT
SET CARDIOPLEGIA MPS 5001102 (MISCELLANEOUS) ×1 IMPLANT
SHEATH AVANTI 11CM 5FR (MISCELLANEOUS) IMPLANT
SPONGE GAUZE 4X4 12PLY (GAUZE/BANDAGES/DRESSINGS) ×7 IMPLANT
SPONGE LAP 18X18 X RAY DECT (DISPOSABLE) ×3 IMPLANT
STOPCOCK 4 WAY LG BORE MALE ST (IV SETS) ×4 IMPLANT
SUCKER INTRACARDIAC WEIGHTED (SUCKER) ×4 IMPLANT
SUT ETHIBOND 2 0 SH (SUTURE) ×20
SUT ETHIBOND 2 0 SH 36X2 (SUTURE) ×15 IMPLANT
SUT ETHIBOND 5 LR DA (SUTURE) ×8 IMPLANT
SUT ETHIBOND NAB MH 2-0 36IN (SUTURE) ×55 IMPLANT
SUT ETHIBOND X763 2 0 SH 1 (SUTURE) ×1 IMPLANT
SUT ETHILON 3 0 FSL (SUTURE) ×1 IMPLANT
SUT PDS AB 1 CTX 36 (SUTURE) ×16 IMPLANT
SUT PDS AB 3-0 SH 27 (SUTURE) ×12 IMPLANT
SUT PROLENE 3 0 SH DA (SUTURE) ×9 IMPLANT
SUT PROLENE 4 0 RB 1 (SUTURE) ×12
SUT PROLENE 4-0 RB1 .5 CRCL 36 (SUTURE) ×6 IMPLANT
SUT PROLENE 5 0 C 1 36 (SUTURE) ×1 IMPLANT
SUT SILK  1 MH (SUTURE) ×5
SUT SILK 1 MH (SUTURE) ×9 IMPLANT
SUT SILK 1 TIES 10X30 (SUTURE) ×4 IMPLANT
SUT SILK 2 0 SH CR/8 (SUTURE) ×9 IMPLANT
SUT STEEL 6MS V (SUTURE) ×8 IMPLANT
SUT STEEL SZ 6 DBL 3X14 BALL (SUTURE) ×4 IMPLANT
SUT TEM PAC WIRE 2 0 SH (SUTURE) ×8 IMPLANT
SUT VIC AB 1 CTX 18 (SUTURE) ×1 IMPLANT
SUT VIC AB 1 CTX 36 (SUTURE) ×12
SUT VIC AB 1 CTX36XBRD ANBCTR (SUTURE) ×6 IMPLANT
SUT VIC AB 2-0 CTX 27 (SUTURE) ×8 IMPLANT
SUT VIC AB 3-0 SH 8-18 (SUTURE) ×5 IMPLANT
SUT VIC AB 3-0 X1 27 (SUTURE) ×9 IMPLANT
SUTURE E-PAK OPEN HEART (SUTURE) IMPLANT
SYR 50ML LL SCALE MARK (SYRINGE) ×4 IMPLANT
SYSTEM SAHARA CHEST DRAIN ATS (WOUND CARE) ×4 IMPLANT
TAPE CLOTH SURG 4X10 WHT LF (GAUZE/BANDAGES/DRESSINGS) ×1 IMPLANT
TAPE PAPER 2X10 WHT MICROPORE (GAUZE/BANDAGES/DRESSINGS) ×1 IMPLANT
TOWEL OR 17X26 10 PK STRL BLUE (TOWEL DISPOSABLE) ×12 IMPLANT
TRAY CATH LUMEN 1 20CM STRL (SET/KITS/TRAYS/PACK) ×5 IMPLANT
TRAY FOLEY IC TEMP SENS 14FR (CATHETERS) ×4 IMPLANT
TUBE SUCT INTRACARD DLP 20F (MISCELLANEOUS) ×4 IMPLANT
UNDERPAD 30X30 INCONTINENT (UNDERPADS AND DIAPERS) ×4 IMPLANT
VENT LEFT HEART 12002 (CATHETERS) ×4
WATER STERILE IRR 1000ML POUR (IV SOLUTION) ×8 IMPLANT

## 2013-04-06 NOTE — Progress Notes (Signed)
Patient ID: [name], female   DOB: 1935/01/27, 78 y.o.   MRN: [id number] EVENING ROUNDS NOTE :     [address]       [place] [address]             [phone]                 Day of Surgery Procedure(s) (LRB): INSERTION OF IMPLANTABLE LEFT VENTRICULAR ASSIST DEVICE (N/A) PLACEMENT OF CENTRIMAG VENTRICULAR ASSIST DEVICE (Right) INTRAOPERATIVE TRANSESOPHAGEAL ECHOCARDIOGRAM (N/A)  Total Length of Stay:  LOS: 1 day  BP 121/80  Pulse 83  Temp(Src) 96.1 F (35.6 C) (Core (Comment))  Resp 27  Ht 4' 11.84" (1.52 m)  Wt 157 lb 10.1 oz (71.5 kg)  BMI 30.95 kg/m2  SpO2 100%  .Intake/Output     08/04 0701 - 08/05 0700 08/05 0701 - 08/06 0700   P.O. 540    I.V. (mL/kg) 386 (5.4) 2675.1 (37.4)   Blood  1071   NG/GT  30   IV Piggyback  1600   Total Intake(mL/kg) 926 (13) 5376.1 (75.2)   Urine (mL/kg/hr) 2300 1940 (2.4)   Blood  1400 (1.7)   Chest Tube  240 (0.3)   Total Output 2300 3580   Net -1374 +1796.1          . sodium chloride    . sodium chloride 20 mL/hr at 04/06/13 1500  . [START ON 04/07/2013] sodium chloride    . dexmedetomidine 0.7 mcg/kg/hr (04/06/13 1700)  . epinephrine    . heparin 900 Units/hr (04/06/13 0026)  . insulin (NOVOLIN-R) infusion 0.9 mL/hr at 04/06/13 1700  . lactated ringers 20 mL/hr at 04/06/13 1500  . milrinone 0.375 mcg/kg/min (04/06/13 1500)  . nitroGLYCERIN Stopped (04/06/13 1500)  . norepinephrine (LEVOPHED) Adult infusion Stopped (04/06/13 1600)  . phenylephrine (NEO-SYNEPHRINE) Adult infusion Stopped (04/06/13 1500)     Lab Results  Component Value Date   WBC 10.8* 04/06/2013   HGB 9.2* 04/06/2013   HCT 27.0* 04/06/2013   PLT 123* 04/06/2013   PLT 122* 04/06/2013   GLUCOSE 115* 04/06/2013   CHOL  Value: 130        ATP III CLASSIFICATION:  <200     mg/dL   Desirable  213-086  mg/dL   Borderline High  >=578    mg/dL   High        12/07/9627   TRIG 90 05/05/2010   HDL 36* 05/05/2010   LDLCALC  Value: 76        Total  Cholesterol/HDL:CHD Risk Coronary Heart Disease Risk Table                     Men   Women  1/2 Average Risk   3.4   3.3  Average Risk       5.0   4.4  2 X Average Risk   9.6   7.1  3 X Average Risk  23.4   11.0        Use the calculated Patient Ratio above and the CHD Risk Table to determine the patient's CHD Risk.        ATP III CLASSIFICATION (LDL):  <100     mg/dL   Optimal  528-413  mg/dL   Near or Above                    Optimal  130-159  mg/dL   Borderline  244-010  mg/dL  High  >190     mg/dL   Very High 05/07/6212   ALT 11 04/05/2013   AST 17 04/05/2013   NA 147* 04/06/2013   K 2.5* 04/06/2013   CL 109 04/06/2013   CREATININE 0.90 04/06/2013   BUN 7 04/06/2013   CO2 22 04/06/2013   TSH 2.99 01/13/2013   INR 1.59* 04/06/2013   HGBA1C 5.7* 04/05/2013   Remains sedated Urine output ok Flow 3.9- 4.1   Brianna Ovens MD  Beeper [phone] Office [phone] 04/06/2013 6:17 PM

## 2013-04-06 NOTE — Op Note (Signed)
CARDIOVASCULAR SURGERY OPERATIVE NOTE  04/06/2013 Brianna Hanson [id number]  Surgeon:  Alleen Borne, MD  First Assistant: Lovett Sox, MD    Preoperative Diagnosis: Class IV heart failure with LVEF< 20%   Postoperative Diagnosis:  Same   Procedure:  1. Insertion of left femoral arterial line 2. Median Sternotomy 3. Extracorporeal circulation 4.   Implantation of HeartMate II Left Ventricular Assist Device.   Anesthesia:  General Endotracheal   Clinical History/Surgical Indication:  The patient is a 78 year old female with a history of heart failure due to non-ischemic cardiomyopathy with an EF of 15% diagnosed in 1999. She had a cardiac arrest in 2010 due to V-fib and was successfully resusitated and had an ICD placed. She has had PAF but has maintained sinus rhythm on amiodarone. She has been followed in the heart failure clinic with NYHA IIIB symptoms. She had been followed at [facility] by Dr. Glori Luis and was previously on the transplant list. She decided to be followed at [facility] after Dr. Glori Luis left. She was scheduled for a RHC on 03/03/2013 and was found to have low output HF with elevated right heart pressures with PA = 53/20 (32), RV = 51/11/16, RA = 11, CI = 1.5, PVR = 8.5. She was started on Milrinone and LH/RH cath on 03/12/2013 showed normal coronaries with severe biventricular dysfunction with moderate PAH and marginal RV function. The Milrinone was increased to 0.375 and Revatio 20 tid was started. She was noted to have a PICC line infection and it was removed. She completed 48 hrs of antibiotics and blood cultures were negative. She had a new PICC placed. Since discharge she says she has felt better with no dyspnea, orthopnea or edema. She has remained weak. Right heart cath on 03/30/2013 on milrinone 0.375 mcg/kd/min and sildenafil 40 tid showed low RA pressure of 5 but elevated RVEDP of 16-18. PA pressure was 43/27 with PCW of 18 and CI 1.9. She has class IV heart failure despite  IV Milrinone and is getting weaker. It is time to proceed with LVAD insertion. I have discussed the procedure with her and her caregiver including the alternative of continued medical therapy or palliative care. I discussed the benefits and risks; including but not limited to bleeding, blood transfusion, infection, stroke, myocardial infarction, right heart failure requiring support with an RVAD, heart block requiring a permanent pacemaker, organ dysfunction, and death. I discussed the longer term risks of pump thrombosis, bleeding and thromboembolism, pump failure, and drive line or pump infection. Brianna Hanson understands and agrees to proceed.       Preparation:  The patient was seen in the preoperative holding area and the correct patient, correct operation were confirmed with the patient after reviewing the medical record and catheterization. The consent was signed by me. Preoperative antibiotics were given. A pulmonary arterial line and radial arterial line were placed by the anesthesia team. The patient was taken back to the operating room and positioned supine on the operating room table. After being placed under general endotracheal anesthesia by the anesthesia team a foley catheter was placed. The neck, chest, abdomen, and both legs were prepped with betadine soap and solution and draped in the usual sterile manner. A surgical time-out was taken and the correct patient and operative procedure were confirmed with the nursing and anesthesia staff.   Pre-bypass and Post-bypass TEE:   Complete TEE assessment was performed by Dr. Kipp Brood.   Transesophageal Echocardiography  Patient: Brianna, Hanson MR #:  [id number] Study Date: 04/06/2013 Gender: F Age: 78 Height: Weight: BSA: Pt. Status: Room: 2915  Brianna Hanson ATTENDING Burnett Harry PERFORMING Kipp Brood SONOGRAPHER Melissa Morford,  RDCS cc:  ------------------------------------------------------------ LV EF: 15%  ------------------------------------------------------------ Study Conclusions  - Left ventricle: The estimated ejection fraction was 15%. Diffuse hypokinesis. - Mitral valve: Moderately dilated annulus. Mildly thickened, noncalcified leaflets . Mobility was restricted. Moderate regurgitation directed centrally. - Left atrium: The atrium was dilated. No evidence of thrombus in the atrial cavity or appendage. No evidence of thrombus in the atrial cavity or appendage. No evidence of thrombus in the atrial cavity or appendage. No evidence of thrombus in the atrial cavity or appendage. - Right ventricle: Systolic function was mildly to moderately reduced. - Atrial septum: No defect or patent foramen ovale was identified. Transesophageal echocardiography. 2D and color Doppler.  ------------------------------------------------------------  ------------------------------------------------------------ Left ventricle: Severe global left ventricular dysfunction with global hypokinesis and estimated ejection fraction 15%. LV end diastolic diameter 60 mm, LV end systolic diameter 57 mm in the transgastric short axis view at the mid-papillary level. There was no thrombus inthe LV or LV apex. LV wall thickness 0.85 cmof the anterior wall. The estimated ejection fraction was 15%. Diffuse hypokinesis.  ------------------------------------------------------------ Aortic valve: Aortic valve: trileaflet, leaflets mildly thickened, opened normally, no aortic insufficiency. Trileaflet; mildly thickened leaflets. Doppler: No regurgitation.  ------------------------------------------------------------ Aorta: Mild calcification of the aortic root in the area of the right sinus of valsalva. Well defined aortic root and sinotubular ridge without effacement. No dilatation of ascending aorta. No thickening  significant atheromatous disease noted in the ascending aorta. The aorta was normal, not dilated, and non-diseased. There was no evidence for dissection.  ------------------------------------------------------------ Mitral valve: There was a dilataed mitral annulus with lateral displacement of the papillary muscles and tethering of the mitral apparatus. There was 2+ mitral insufficiency with a central jet. There were prolapsing or flail seegments noted. Well visualized. Moderately dilated annulus. Mildly thickened, noncalcified leaflets . Leaflet separation was normal. Mobility was restricted. Doppler: Moderate regurgitation directed centrally.  ------------------------------------------------------------ Left atrium: LA diameter: 4.97 in medial-lateral dimension and 6.39 in superior-inferior dimension. Well visualized. The atrium was dilated. No evidence of thrombus in the atrial cavity or appendage. No evidence of thrombus in the atrial cavity or appendage. No evidence of thrombus in the atrial cavity or appendage. No evidence of thrombus in the atrial cavity or appendage. The appendage was well visualized.  ------------------------------------------------------------ Atrial septum: NO ASD or PFO by color doppler and multiple bubble studies with agitated albumin and valsalva manuvers. Well visualized. No defect or patent foramen ovale was identified.  ------------------------------------------------------------ Right ventricle: RV was of normal size with mild to moderately reduced systolic contractility of RV free wall. Well visualized. The cavity size was at the upper limits of normal. Pacer wire or catheter noted in right ventricle. Systolic function was mildly to moderately reduced.  ------------------------------------------------------------ Tricuspid valve: Well visualized. Normal-sized annulus. Normal thickness leaflets. Leaflet separation was normal. Doppler:  Trivial regurgitation originating from the central coaptation point.  ------------------------------------------------------------ Right atrium: Pacer wire or catheter noted in right atrium.  ------------------------------------------------------------ Pre bypass:  Post bypass:  - LV size was moderately reduced. - The LV was reduced in size from the pre-bypass exam. The LVAD inflow canula was in good position in the LV apex and was oriented towards the mitral valve. It was not abuting the septum or lateral wall. There was laminar flow into the inflow cannula. The interventricular septum was bowing  slightly to towards the RV. - LV global systolic function was severely reduced. The estimated LV ejection fraction was 15%. - There was diffuse LV hypokinesis. Aortic valve: No regurgitation.  ------------------------------------------------------------ Post procedure conclusions Ascending Aorta:  - Mild calcification of the aortic root in the area of the right sinus of valsalva. Well defined aortic root and sinotubular ridge without effacement. No dilatation of ascending aorta. No thickening significant atheromatous disease noted in the ascending aorta. The aorta was normal, not dilated, and non-diseased.  ------------------------------------------------------------ Stress echo results: Post-bypass findings:  Aortic Valve: no aortic insufficiency, unchanged from the pre-bypass. The aortic valve opened every 3-4 heart beats when the TEE probe was removed.  Mitral Valve: There was a central jet of mitral reguruugitation which was reduced from the pre-bypass study from 2+ pre-bypass to 1+ post-bypass.  Right Ventricle: The RV cavity was slightlyreduced in size from the pre-bypass study. There was mild reuced contractility of the RV free wall.  Tricuspid Valve: The leaflet morphology appeared unchanged from the pre-bypass study. There was trace  tricuspid insufficiency.  Inter-atrial septum: No PFO or ASD by color doppler or bubble study.  ------------------------------------------------------------  2D measurements Normal Left atrium AP dim 63.88 mm ------  ------------------------------------------------------------ Prepared and Electronically Authenticated by  Kipp Brood 2014-08-05T16:30:40.737    Creation of pump pocket:   A median sternotomy was performed. The pericardium was opened in the midline. Right ventricular function appeared moderately depressed with mild distension. PAP was 50/22 with CVP 6. The ascending aorta was of normal size and had no palpable plaque. There were no contraindications to aortic cannulation or cross-clamping. The left side of the sternum with retracted with the Ruh[place]. The left diaphragm was taken down from the anterior chest wall using electrocautery. This was continued laterally as far as possible. A pre-peritoneal plane was developed inferiorly and extended across the mid line toward the right. All blood vessels of any size were clipped and divided. The prototype was placed into the pocket to confirm the right size. Then a 4 cm transverse incision was made to the right of the umbilicus and continued down to the rectus fascia using electrocautery. A skin punch was used to create the drive line exit site about 3 cm below the right costal margin in the anterior axillary line. The straight tunneling device was passed in a subcutaneous plane from the transverse abdominal incision to a point midway to the pericardium and then through the rectus fascia into the pre-peritoneal space. The curved tunneling device was passed from the abdominal incision the the skin exit site.  Cardiopulmonary Bypass:   The patient was fully systemically heparinized and the ACT was maintained > 400 sec. The proximal aortic arch was cannulated with a 20 F aortic cannula for arterial inflow. Venous cannulation was  performed via the right atrial appendage using a two-staged venous cannula. Carbon dioxide was insufflated into the pericardium at 6L/min throughout the procedure to minimize intracardiac air. The systemic temperature was allowed to drift downward slightly during the procedure and the patient was actively rewarmed.   Aortic outflow graft anastomosis:  The outflow graft was cut to the appropriate length. It was placed inside the bend relief. The ascending aorta was partially occluded with a Lemole clamp. A vertical midline aortotomy was created and the ends enlarged with a 4.5 mm punch. The outflow graft was anastomosed to the aorta using 4-0 prolene pledgetted sutures at the toe and heal that were run down each side. Coseal was used to  seal the needle holes in the graft. The Lemole clamp was removed and the graft flushed and clamped with a peripheral Debakey clamp.   Insertion of apical outflow cannula:  The patient was placed on cardiopulmonary bypass. Laparotomy pads were placed behind the heart to aid exposure of the apex. A site was chosen on the anterior wall lateral to the LAD and superior to the true apex. A stab incision was made and a foley catheter placed through the coring device and into the left ventricle. The balloon was inflated and with gentle traction on the catheter the coring device was carefully advanced to create the ventriculotomy. The balloon was deflated and removed. A sump was placed into the LV and the ventricular core excised. Trabeculations that might cause obstruction were excised. Then a series of 2-0 Ethibond horizontal mattress sutures were placed around the ventriculotomy. This took 16 sutures. The sutures were placed through the sewing ring on the outflow sleeve. The sutures were tied and the sleeve appeared to be well-sealed to the apex. Coseal was applied to seal needle holes. Then volume was left in the heart and the lungs inflated. The HeartMate II was brought onto the  operative field. The inflow cannula was inserted into the apical sleeve and the sleeve suture tied. The outflow end of the pump was connected to vent suction. A tie band was placed around the apical sleeve followed by two #5 Ethibond ties. The drive line was then tunneled using the previously placed tunneling devices. A loop was placed in the preperitoneal space. The outflow graft was then attached to the pump. A 20 gauge needle was placed into the outflow graft for deairing. The pump was placed into the pocket.   Weaning from bypass:  The patient was started on Milrinone 0.3 mcg/kg/min, epinephrine 3 mcg/kg/min, and nitric oxide at 20 ppm. The HeartMate II pump was started at 6000 rpm. TEE confirmed that the intracardiac air was removed.  Cardiopulmonary bypass flow was decreased to half flow and the clamp removed from the outflow graft. The speed was increased to 7000 rpm. Flow was decreased to 1/4 and the speed increased to 8000. Cardiopulmonary bypass was discontinued and the speed increased to 8400. Pulmonary artery pressures were normal at 40/16 with a CVP of 6. CO was 6-8 L/min. CPB time was 91 minutes.   Completion:  Two temporary epicardial pacing wires were placed on the right ventricle. Heparin was fully reversed with protamine and the aortic and venous cannulas removed. Hemostasis was achieved. Mediastinal drainage tubes and bilateral pleural chest tubes were placed with a drain in the pocket.  The sternum was closed with #6 stainless steel wires. The fascia was closed with continuous # 1 vicryl suture with interrupted # 1 vicryl sutures in the lower incisional fascia. The subcutaneous tissue was closed with 2-0 vicryl continuous suture. The skin was closed with 3-0 vicryl subcuticular suture. The abdominal incision was closed with continuous 3-0 vicryl subcutaneous suture and 3-0 vicryl subcuticular skin suture. The drive line exit site was re-approximated with 3-0 vicryl subcutaneous sutures  and 3-0 nylon skin sutures. The drive line fastener was applied. All sponge, needle, and instrument counts were reported correct at the end of the case. Dry sterile dressings were placed over the incisions and around the chest tubes which were connected to pleurevac suction. The patient was then transported to the surgical intensive care unit in critical but stable condition.

## 2013-04-06 NOTE — Anesthesia Postprocedure Evaluation (Signed)
  Anesthesia Post-op Note  Patient: Brianna Hanson  Procedure(s) Performed: Procedure(s): INSERTION OF IMPLANTABLE LEFT VENTRICULAR ASSIST DEVICE (N/A) PLACEMENT OF CENTRIMAG VENTRICULAR ASSIST DEVICE (Right) INTRAOPERATIVE TRANSESOPHAGEAL ECHOCARDIOGRAM (N/A)  Patient Location: SICU  Anesthesia Type:General  Level of Consciousness: sedated and Patient remains intubated per anesthesia plan  Airway and Oxygen Therapy: Patient remains intubated per anesthesia plan and Patient placed on Ventilator (see vital sign flow sheet for setting)  Post-op Pain: none  Post-op Assessment: Post-op Vital signs reviewed and Patient's Cardiovascular Status Stable  Post-op Vital Signs: stable  Complications: No apparent anesthesia complications

## 2013-04-06 NOTE — Progress Notes (Signed)
ANTICOAGULATION CONSULT NOTE - Follow Up Consult  Pharmacy Consult for heparin Indication: atrial fibrillation and awaiting LVAD  Labs:  Recent Labs  04/05/13 1220 04/05/13 1346 04/05/13 2332  HGB 12.4  --   --   HCT 36.6  --   --   PLT 188  --   --   LABPROT  --  14.4  --   INR  --  1.14  --   HEPARINUNFRC  --   --  0.25*  CREATININE 1.18*  --   --     Assessment: 78yo female slightly subtherapeutic on heparin with initial dosing for Afib during admission awaiting LVAD in am.  Goal of Therapy:  Heparin level 0.3-0.7 units/ml   Plan:  Will increase heparin gtt by ~1 unit/kg/hr to 900 units/hr until off for OR in am.  Vernard Gambles, PharmD, BCPS  04/06/2013,12:05 AM

## 2013-04-06 NOTE — Progress Notes (Signed)
Day of Surgery Procedure(s) (LRB): INSERTION OF IMPLANTABLE LEFT VENTRICULAR ASSIST DEVICE (N/A) PLACEMENT OF CENTRIMAG VENTRICULAR ASSIST DEVICE (Right) INTRAOPERATIVE TRANSESOPHAGEAL ECHOCARDIOGRAM (N/A) Subjective: 78 year old [place] American female with nonischemic cardiomyopathy followed since 2010 in the heart failure clinic initially at [place] and now at [place]. Her ejection fraction is 15% and she is shown clinical deterioration over the past 2 months. 6 weeks ago she was placed on home IV milrinone. Right heart cath at that time showed moderate RV dysfunction and she was started on oral sildenafil with improvement. However recently her exercise tolerance and symptoms of heart failure and definitely worsened to class 4 despite optimal medical management. At age 62 she is not now considered a transplant candidate and does not wish cardiac transplantation but is accepting advance therapy with a implantable left ventricular assist device. Her renal, hepatic, neurologic, and pulmonary status has been evaluated and are satisfactory. She is on chronic Coumadin for a history of paroxysmal atrial fibrillation, currently in sinus rhythm.   Objective: Vital signs in last 24 hours: Temp:  [97.8 F (36.6 C)-98.2 F (36.8 C)] 98 F (36.7 C) (08/05 0445) Pulse Rate:  [41-102] 94 (08/05 0729) Cardiac Rhythm:  [-] Normal sinus rhythm (08/04 2000) Resp:  [16-24] 16 (08/04 1415) BP: (96-127)/(53-95) 121/80 mmHg (08/05 0445) SpO2:  [90 %-99 %] 96 % (08/05 0445) FiO2 (%):  [0 %] 0 % (08/04 1200) Weight:  [157 lb 10.1 oz (71.5 kg)-169 lb (76.658 kg)] 157 lb 10.1 oz (71.5 kg) (08/05 0500)  Hemodynamic parameters for last 24 hours:   sinus rhythm  Intake/Output from previous day: 08/04 0701 - 08/05 0700 In: 926 [P.O.:540; I.V.:386] Out: 2300 [Urine:2300] Intake/Output this shift:    Exam Patient is alert and comfortable the CCU Lungs are clear Heart rhythm is regular Positive S3  gallop negative murmur Extremities warm without edema or tenderness Neuro intact  Lab Results:  Recent Labs  04/05/13 1220 04/06/13 0523  WBC 4.6 6.1  HGB 12.4 12.7  HCT 36.6 38.3  PLT 188 191   BMET:  Recent Labs  04/05/13 1220 04/06/13 0523  NA 141 140  K 3.5 4.0  CL 106 107  CO2 25 21  GLUCOSE 86 90  BUN 9 11  CREATININE 1.18* 1.10  CALCIUM 9.3 9.5    PT/INR:  Recent Labs  04/05/13 1346  LABPROT 14.4  INR 1.14   ABG    Component Value Date/Time   PHART 7.394 03/12/2013 1245   HCO3 20.6 03/30/2013 1120   TCO2 22 03/30/2013 1120   ACIDBASEDEF 4.0* 03/30/2013 1120   O2SAT 53.0 03/30/2013 1120   CBG (last 3)   Recent Labs  04/06/13 0502  GLUCAP 79    Assessment/Plan: S/P Procedure(s) (LRB): INSERTION OF IMPLANTABLE LEFT VENTRICULAR ASSIST DEVICE (N/A) PLACEMENT OF CENTRIMAG VENTRICULAR ASSIST DEVICE (Right) INTRAOPERATIVE TRANSESOPHAGEAL ECHOCARDIOGRAM (N/A) Plan LVAD implantation for advanced class 4 heart failure with EF less than 20% and peak O2 consumption less than 7.0   LOS: 1 day    VAN TRIGT III,PETER 04/06/2013

## 2013-04-06 NOTE — Progress Notes (Signed)
Transported from OR to 2310 on 100% O2 and 20 ppm Nitric Oxide.  Patient tolerated well.

## 2013-04-06 NOTE — Anesthesia Procedure Notes (Signed)
Procedures PA catheter:  Routine monitors. Timeout, sterile prep, drape, FBP R neck.  Trendelenburg position.  1% Lido local, finder and trocar RIJ 1st pass with US guidance.  2 lumen introducer placed over J wire. PA catheter in with transient PVC's, multiple passes.  Sterile dressing applied.  Patient tolerated well, VSS.  Sandford Craze, MD  [phone]

## 2013-04-06 NOTE — Anesthesia Preprocedure Evaluation (Addendum)
Anesthesia Evaluation  Patient identified by MRN, date of birth, ID band Patient awake    Reviewed: Allergy & Precautions, H&P , NPO status , Patient's Chart, lab work & pertinent test results  History of Anesthesia Complications Negative for: history of anesthetic complications  Airway Mallampati: II TM Distance: >3 FB Neck ROM: Full    Dental  (+) Edentulous Upper and Edentulous Lower   Pulmonary shortness of breath and at rest,  breath sounds clear to auscultation        Cardiovascular hypertension, Pt. on medications +CHF (non-ischemic heart failure, EF 15% on ech 02/02/13. Right heart failure PA 53/20 (32). On milrinone 0.361mck/kg/min) + dysrhythmias (vfib arrrest 2010 St. Jude AICD placed. History of Afib, now maintaing SR on amiodarone) Atrial Fibrillation and Ventricular Fibrillation Rhythm:Regular Rate:Normal  SR with frequent PVCs   Neuro/Psych    GI/Hepatic   Endo/Other    Renal/GU Renal InsufficiencyRenal diseaseCreatinine 1.1     Musculoskeletal   Abdominal   Peds  Hematology On chronic coumadin. Plasma cell disorder (Likely IgA MGUS) - followed by DR. Shadad.    Anesthesia Other Findings   Reproductive/Obstetrics                          Anesthesia Physical Anesthesia Plan  ASA: IV  Anesthesia Plan: General   Post-op Pain Management:    Induction: Intravenous  Airway Management Planned: Oral ETT  Additional Equipment: Arterial line, CVP, PA Cath, 3D TEE and Ultrasound Guidance Line Placement  Intra-op Plan:   Post-operative Plan: Post-operative intubation/ventilation  Informed Consent: I have reviewed the patients History and Physical, chart, labs and discussed the procedure including the risks, benefits and alternatives for the proposed anesthesia with the patient or authorized representative who has indicated his/her understanding and acceptance.   Dental advisory  given  Plan Discussed with: Anesthesiologist, Surgeon and CRNA  Anesthesia Plan Comments: (End stage heart failure due to non-ischemic cardiomyopathy H/O paroxysmal Afib now in SR Htn H/O plasma cell disorder counts OK)       Anesthesia Quick Evaluation

## 2013-04-06 NOTE — Transfer of Care (Signed)
Immediate Anesthesia Transfer of Care Note  Patient: Brianna Hanson  Procedure(s) Performed: Procedure(s): INSERTION OF IMPLANTABLE LEFT VENTRICULAR ASSIST DEVICE (N/A) PLACEMENT OF CENTRIMAG VENTRICULAR ASSIST DEVICE (Right) INTRAOPERATIVE TRANSESOPHAGEAL ECHOCARDIOGRAM (N/A)  Patient Location: SICU  Anesthesia Type:General  Level of Consciousness: Patient remains intubated per anesthesia plan  Airway & Oxygen Therapy: Patient remains intubated per anesthesia plan and Patient placed on Ventilator (see vital sign flow sheet for setting)  Post-op Assessment: Report given to PACU RN and Post -op Vital signs reviewed and stable  Post vital signs: Reviewed and stable  Complications: No apparent anesthesia complications

## 2013-04-06 NOTE — OR Nursing (Signed)
2nd call to SICU 1356, Brianna Hanson notified of fem a-line left.

## 2013-04-06 NOTE — Progress Notes (Signed)
Ms. Prevette has a St. Jude defibrillator rather than a [employer]tronic as I had earlier noted. Will deactivate the device prior to surgery.  Kipp Brood

## 2013-04-07 ENCOUNTER — Inpatient Hospital Stay (HOSPITAL_COMMUNITY): Payer: Medicare Other

## 2013-04-07 DIAGNOSIS — I2789 Other specified pulmonary heart diseases: Secondary | ICD-10-CM

## 2013-04-07 DIAGNOSIS — Z95811 Presence of heart assist device: Secondary | ICD-10-CM

## 2013-04-07 DIAGNOSIS — Z95818 Presence of other cardiac implants and grafts: Secondary | ICD-10-CM

## 2013-04-07 DIAGNOSIS — R531 Weakness: Secondary | ICD-10-CM

## 2013-04-07 DIAGNOSIS — Z515 Encounter for palliative care: Secondary | ICD-10-CM

## 2013-04-07 DIAGNOSIS — I5023 Acute on chronic systolic (congestive) heart failure: Secondary | ICD-10-CM

## 2013-04-07 LAB — PREPARE FRESH FROZEN PLASMA
Unit division: 0
Unit division: 0
Unit division: 0
Unit division: 0

## 2013-04-07 LAB — CBC WITH DIFFERENTIAL/PLATELET
Basophils Absolute: 0 10*3/uL (ref 0.0–0.1)
Basophils Relative: 0 % (ref 0–1)
Eosinophils Absolute: 0.1 10*3/uL (ref 0.0–0.7)
Eosinophils Relative: 1 % (ref 0–5)
HCT: 27.6 % — ABNORMAL LOW (ref 36.0–46.0)
Hemoglobin: 9.2 g/dL — ABNORMAL LOW (ref 12.0–15.0)
Lymphocytes Relative: 6 % — ABNORMAL LOW (ref 12–46)
Lymphs Abs: 0.7 10*3/uL (ref 0.7–4.0)
MCH: 30.9 pg (ref 26.0–34.0)
MCHC: 33.3 g/dL (ref 30.0–36.0)
MCV: 92.6 fL (ref 78.0–100.0)
Monocytes Absolute: 1 10*3/uL (ref 0.1–1.0)
Monocytes Relative: 9 % (ref 3–12)
Neutro Abs: 9.1 10*3/uL — ABNORMAL HIGH (ref 1.7–7.7)
Neutrophils Relative %: 84 % — ABNORMAL HIGH (ref 43–77)
Platelets: 125 10*3/uL — ABNORMAL LOW (ref 150–400)
RBC: 2.98 MIL/uL — ABNORMAL LOW (ref 3.87–5.11)
RDW: 16.9 % — ABNORMAL HIGH (ref 11.5–15.5)
WBC: 10.9 10*3/uL — ABNORMAL HIGH (ref 4.0–10.5)

## 2013-04-07 LAB — CBC
HCT: 28.2 % — ABNORMAL LOW (ref 36.0–46.0)
Hemoglobin: 9.5 g/dL — ABNORMAL LOW (ref 12.0–15.0)
MCH: 31.5 pg (ref 26.0–34.0)
MCHC: 33.7 g/dL (ref 30.0–36.0)
MCV: 93.4 fL (ref 78.0–100.0)
Platelets: 111 10*3/uL — ABNORMAL LOW (ref 150–400)
RBC: 3.02 MIL/uL — ABNORMAL LOW (ref 3.87–5.11)
RDW: 17.5 % — ABNORMAL HIGH (ref 11.5–15.5)
WBC: 10.6 10*3/uL — ABNORMAL HIGH (ref 4.0–10.5)

## 2013-04-07 LAB — POCT I-STAT 3, ART BLOOD GAS (G3+)
Acid-base deficit: 1 mmol/L (ref 0.0–2.0)
Acid-base deficit: 2 mmol/L (ref 0.0–2.0)
Bicarbonate: 23.8 mEq/L (ref 20.0–24.0)
Bicarbonate: 23.1 mEq/L (ref 20.0–24.0)
Bicarbonate: 24.7 mEq/L — ABNORMAL HIGH (ref 20.0–24.0)
O2 Saturation: 99 %
O2 Saturation: 96 %
O2 Saturation: 95 %
Patient temperature: 36.1
Patient temperature: 36.7
Patient temperature: 37.3
TCO2: 25 mmol/L (ref 0–100)
TCO2: 24 mmol/L (ref 0–100)
TCO2: 26 mmol/L (ref 0–100)
pCO2 arterial: 40 mmHg (ref 35.0–45.0)
pCO2 arterial: 36.7 mmHg (ref 35.0–45.0)
pCO2 arterial: 38.4 mmHg (ref 35.0–45.0)
pH, Arterial: 7.379 (ref 7.350–7.450)
pH, Arterial: 7.405 (ref 7.350–7.450)
pH, Arterial: 7.418 (ref 7.350–7.450)
pO2, Arterial: 166 mmHg — ABNORMAL HIGH (ref 80.0–100.0)
pO2, Arterial: 84 mmHg (ref 80.0–100.0)
pO2, Arterial: 74 mmHg — ABNORMAL LOW (ref 80.0–100.0)

## 2013-04-07 LAB — GLUCOSE, CAPILLARY
Glucose-Capillary: 101 mg/dL — ABNORMAL HIGH (ref 70–99)
Glucose-Capillary: 107 mg/dL — ABNORMAL HIGH (ref 70–99)
Glucose-Capillary: 125 mg/dL — ABNORMAL HIGH (ref 70–99)
Glucose-Capillary: 128 mg/dL — ABNORMAL HIGH (ref 70–99)
Glucose-Capillary: 131 mg/dL — ABNORMAL HIGH (ref 70–99)
Glucose-Capillary: 139 mg/dL — ABNORMAL HIGH (ref 70–99)
Glucose-Capillary: 151 mg/dL — ABNORMAL HIGH (ref 70–99)
Glucose-Capillary: 102 mg/dL — ABNORMAL HIGH (ref 70–99)
Glucose-Capillary: 107 mg/dL — ABNORMAL HIGH (ref 70–99)
Glucose-Capillary: 85 mg/dL (ref 70–99)
Glucose-Capillary: 105 mg/dL — ABNORMAL HIGH (ref 70–99)
Glucose-Capillary: 128 mg/dL — ABNORMAL HIGH (ref 70–99)
Glucose-Capillary: 99 mg/dL (ref 70–99)
Glucose-Capillary: 74 mg/dL (ref 70–99)
Glucose-Capillary: 78 mg/dL (ref 70–99)
Glucose-Capillary: 79 mg/dL (ref 70–99)
Glucose-Capillary: 88 mg/dL (ref 70–99)

## 2013-04-07 LAB — CARBOXYHEMOGLOBIN
Carboxyhemoglobin: 1.6 % — ABNORMAL HIGH (ref 0.5–1.5)
Methemoglobin: 0.9 % (ref 0.0–1.5)
O2 Saturation: 79.7 %
Total hemoglobin: 9 g/dL — ABNORMAL LOW (ref 12.0–16.0)

## 2013-04-07 LAB — PREPARE PLATELET PHERESIS: Unit division: 0

## 2013-04-07 LAB — BASIC METABOLIC PANEL
BUN: 9 mg/dL (ref 6–23)
CO2: 23 mEq/L (ref 19–32)
Calcium: 8.7 mg/dL (ref 8.4–10.5)
Chloride: 115 mEq/L — ABNORMAL HIGH (ref 96–112)
Creatinine, Ser: 0.91 mg/dL (ref 0.50–1.10)
GFR calc Af Amer: 69 mL/min — ABNORMAL LOW (ref >90)
GFR calc non Af Amer: 60 mL/min — ABNORMAL LOW (ref >90)
Glucose, Bld: 104 mg/dL — ABNORMAL HIGH (ref 70–99)
Potassium: 4.2 mEq/L (ref 3.5–5.1)
Sodium: 145 mEq/L (ref 135–145)

## 2013-04-07 LAB — MAGNESIUM
Magnesium: 2.7 mg/dL — ABNORMAL HIGH (ref 1.5–2.5)
Magnesium: 2.3 mg/dL (ref 1.5–2.5)

## 2013-04-07 LAB — CREATININE, SERUM
Creatinine, Ser: 0.86 mg/dL (ref 0.50–1.10)
GFR calc Af Amer: 74 mL/min — ABNORMAL LOW (ref >90)
GFR calc non Af Amer: 64 mL/min — ABNORMAL LOW (ref >90)

## 2013-04-07 LAB — ALT: ALT: 18 U/L (ref 0–35)

## 2013-04-07 LAB — BILIRUBIN, TOTAL: Total Bilirubin: 1.9 mg/dL — ABNORMAL HIGH (ref 0.3–1.2)

## 2013-04-07 LAB — PROTEIN S, TOTAL: Protein S Ag, Total: 74 % (ref 60–150)

## 2013-04-07 LAB — PROTIME-INR
INR: 1.39 (ref 0.00–1.49)
Prothrombin Time: 16.7 seconds — ABNORMAL HIGH (ref 11.6–15.2)

## 2013-04-07 LAB — LACTATE DEHYDROGENASE: LDH: 392 U/L — ABNORMAL HIGH (ref 94–250)

## 2013-04-07 LAB — PROTEIN C, TOTAL: Protein C, Total: 79 % (ref 72–160)

## 2013-04-07 LAB — PHOSPHORUS: Phosphorus: 3.2 mg/dL (ref 2.3–4.6)

## 2013-04-07 LAB — PRO B NATRIURETIC PEPTIDE: Pro B Natriuretic peptide (BNP): 3243 pg/mL — ABNORMAL HIGH (ref 0–450)

## 2013-04-07 LAB — AST: AST: 107 U/L — ABNORMAL HIGH (ref 0–37)

## 2013-04-07 MED ORDER — POTASSIUM CHLORIDE 10 MEQ/50ML IV SOLN
10.0000 meq | INTRAVENOUS | Status: AC
  Administered 2013-04-07 (×2): 10 meq via INTRAVENOUS

## 2013-04-07 MED ORDER — POTASSIUM CHLORIDE 10 MEQ/50ML IV SOLN
INTRAVENOUS | Status: AC
  Administered 2013-04-07: 10 meq
  Filled 2013-04-07: qty 200

## 2013-04-07 MED ORDER — FUROSEMIDE 10 MG/ML IJ SOLN
40.0000 mg | Freq: Once | INTRAMUSCULAR | Status: AC
  Administered 2013-04-07: 40 mg via INTRAVENOUS

## 2013-04-07 MED ORDER — WARFARIN SODIUM 2.5 MG PO TABS
2.5000 mg | ORAL_TABLET | Freq: Once | ORAL | Status: AC
  Administered 2013-04-07: 2.5 mg via ORAL
  Filled 2013-04-07: qty 1

## 2013-04-07 MED ORDER — WARFARIN - PHYSICIAN DOSING INPATIENT
Freq: Every day | Status: DC
  Administered 2013-04-08 – 2013-04-10 (×2)

## 2013-04-07 MED ORDER — INSULIN ASPART 100 UNIT/ML ~~LOC~~ SOLN
0.0000 [IU] | SUBCUTANEOUS | Status: DC
  Administered 2013-04-07 – 2013-04-09 (×2): 2 [IU] via SUBCUTANEOUS

## 2013-04-07 MED FILL — Sodium Chloride Irrigation Soln 0.9%: Qty: 3000 | Status: AC

## 2013-04-07 MED FILL — Potassium Chloride Inj 2 mEq/ML: INTRAVENOUS | Qty: 40 | Status: AC

## 2013-04-07 MED FILL — Sodium Bicarbonate IV Soln 8.4%: INTRAVENOUS | Qty: 50 | Status: AC

## 2013-04-07 MED FILL — Heparin Sodium (Porcine) Inj 1000 Unit/ML: INTRAMUSCULAR | Qty: 30 | Status: AC

## 2013-04-07 MED FILL — Magnesium Sulfate Inj 50%: INTRAMUSCULAR | Qty: 10 | Status: AC

## 2013-04-07 MED FILL — Mannitol IV Soln 20%: INTRAVENOUS | Qty: 500 | Status: AC

## 2013-04-07 MED FILL — Lidocaine HCl IV Inj 20 MG/ML: INTRAVENOUS | Qty: 5 | Status: AC

## 2013-04-07 MED FILL — Heparin Sodium (Porcine) Inj 1000 Unit/ML: INTRAMUSCULAR | Qty: 10 | Status: AC

## 2013-04-07 MED FILL — Calcium Chloride Inj 10%: INTRAVENOUS | Qty: 10 | Status: AC

## 2013-04-07 MED FILL — Sodium Chloride IV Soln 0.9%: INTRAVENOUS | Qty: 1000 | Status: AC

## 2013-04-07 MED FILL — Electrolyte-R (PH 7.4) Solution: INTRAVENOUS | Qty: 4000 | Status: AC

## 2013-04-07 NOTE — Procedures (Signed)
Extubation Procedure Note  Patient Details:   Name: Brianna Hanson DOB: [birth year] MRN: [id number]   Airway Documentation:     Evaluation  O2 sats: stable throughout Complications: No apparent complications Patient did tolerate procedure well. Bilateral Breath Sounds: Clear   Yes, Pt extubated to a 4lpm [place], Sp02 100, HR 81  Melanee Spry 04/07/2013, 12:17 PM

## 2013-04-07 NOTE — Progress Notes (Signed)
  SICU Evening Rounds:  1 Day Post-Op Procedure(s) (LRB): INSERTION OF IMPLANTABLE LEFT VENTRICULAR ASSIST DEVICE (N/A)  INTRAOPERATIVE TRANSESOPHAGEAL ECHOCARDIOGRAM (N/A) Subjective: No complaints  Objective: Vital signs in last 24 hours: Temp:  [96.1 F (35.6 C)-100 F (37.8 C)] 100 F (37.8 C) (08/06 1645) Pulse Rate:  [26-85] 80 (08/06 1715) Cardiac Rhythm:  [-] Normal sinus rhythm (08/06 1600) Resp:  [0-38] 30 (08/06 1715) SpO2:  [91 %-100 %] 95 % (08/06 1715) Arterial Line BP: (61-117)/(57-83) 108/71 mmHg (08/06 1715) FiO2 (%):  [40 %-60 %] 40 % (08/06 1200) Weight:  [77.3 kg (170 lb 6.7 oz)] 77.3 kg (170 lb 6.7 oz) (08/06 0145)  Hemodynamic parameters for last 24 hours: PAP: (36-61)/(12-28) 52/19 mmHg CVP:  [4 mmHg-20 mmHg] 7 mmHg CO:  [4.2 L/min-6 L/min] 5.1 L/min CI:  [2.5 L/min/m2-3.6 L/min/m2] 3.1 L/min/m2  Intake/Output from previous day: 08/05 0701 - 08/06 0700 In: 8184.6 [I.V.:3903.6; Blood:1421; NG/GT:60; IV Piggyback:2800] Out: 4970 [Urine:2870; Blood:1400; Chest Tube:700] Intake/Output this shift: Total I/O In: 708 [P.O.:60; I.V.:538; NG/GT:60; IV Piggyback:50] Out: 2625 [Urine:2165; Emesis/NG output:250; Chest Tube:210]    Lab Results:  Recent Labs  04/07/13 0408 04/07/13 1630  WBC 10.9* 10.6*  HGB 9.2* 9.5*  HCT 27.6* 28.2*  PLT 125* 111*   BMET:  Recent Labs  04/06/13 1455  04/06/13 2112 04/07/13 0408 04/07/13 1630  NA 145  < > 148* 145  --   K 2.5*  < > 3.3* 4.2  --   CL 112  < > 112 115*  --   CO2 22  --   --  23  --   GLUCOSE 118*  < > 144* 104*  --   BUN 9  < > 7 9  --   CREATININE 0.90  < > 1.10 0.91 0.86  CALCIUM 8.4  --   --  8.7  --   < > = values in this interval not displayed.  PT/INR:  Recent Labs  04/07/13 0408  LABPROT 16.7*  INR 1.39   ABG    Component Value Date/Time   PHART 7.418 04/07/2013 1354   HCO3 24.7* 04/07/2013 1354   TCO2 26 04/07/2013 1354   ACIDBASEDEF 2.0 04/07/2013 1158   O2SAT 95.0 04/07/2013  1354   CBG (last 3)   Recent Labs  04/07/13 0745 04/07/13 1155 04/07/13 1602  GLUCAP 128* 74 78    Assessment/Plan: S/P Procedure(s) (LRB): INSERTION OF IMPLANTABLE LEFT VENTRICULAR ASSIST DEVICE (N/A)  She is doing well since extubation earlier today. She is only on Milrinone .375. MAP 80-90.  She diuresed well today.  Minimal chest tube output. Removing pleural tubes now.    LOS: 2 days    BARTLE,BRYAN K 04/07/2013

## 2013-04-07 NOTE — Progress Notes (Signed)
Patient ID: Brianna Hanson, female   DOB: 1934/12/29, 78 y.o.   MRN: [id number] HeartMate 2 Rounding Note  Subjective:    78 y/o female with severe CHF due to NICM, PAH, chronic AF underwent HM II VAD implant 04/06/13  Remains intubated. On Precedex. PA pressures staying down despite NO wean.  Hemodynamics look good. Good u/o.   CVP 8-12 PA 39/18 CO/CI 4.2/2.5  Co-ox 79%  Milrinone 0.375 Epi 1.5 mcg NO 2 ppm    LVAD INTERROGATION:  HeartMate II LVAD:  Flow 3.6 liters/min, speed 8800, power 4.1, PI 5.   No alarms or PI events  Objective:    Vital Signs:   Temp:  [96.1 F (35.6 C)-98.4 F (36.9 C)] 97.2 F (36.2 C) (08/06 0900) Pulse Rate:  [28-91] 68 (08/06 0900) Resp:  [0-28] 16 (08/06 0900) SpO2:  [91 %-100 %] 100 % (08/06 0900) Arterial Line BP: (61-109)/(57-80) 78/69 mmHg (08/06 0900) FiO2 (%):  [50 %] 50 % (08/06 0900) Weight:  [77.3 kg (170 lb 6.7 oz)] 77.3 kg (170 lb 6.7 oz) (08/06 0145) Last BM Date: 04/06/13 Mean arterial Pressure 75-80     Intake/Output:   Intake/Output Summary (Last 24 hours) at 04/07/13 0939 Last data filed at 04/07/13 0900  Gross per 24 hour  Intake 8319.81 ml  Output   5235 ml  Net 3084.81 ml     Physical Exam: General:  Intubated sedated HEENT: mildly puffy ET tube in place Neck: supple. JVP 10 . Carotids 2+ bilat; no bruits. No lymphadenopathy or thryomegaly appreciated. Cor: Mechanical heart sounds with LVAD hum present. Dressing c/d/i Lungs: clear Abdomen: soft, nontender, nondistended. No hepatosplenomegaly. No bruits or masses. Good bowel sounds. Driveline: C/D/I; securement device intact and driveline incorporated Extremities: no cyanosis, clubbing, rash, 2+ edema Neuro: intubated/sedated  Telemetry: sinus 72  Labs: Basic Metabolic Panel:  Recent Labs Lab 04/05/13 1220 04/06/13 0523  04/06/13 1251 04/06/13 1455 04/06/13 1503 04/06/13 2052 04/06/13 2112 04/07/13 0408 04/07/13 0500  NA 141 140  < > 142 145  147*  --  148* 145  --   K 3.5 4.0  < > 2.9* 2.5* 2.5*  --  3.3* 4.2  --   CL 106 107  --   --  112 109  --  112 115*  --   CO2 25 21  --   --  22  --   --   --  23  --   GLUCOSE 86 90  < > 200* 118* 115*  --  144* 104*  --   BUN 9 11  --   --  9 7  --  7 9  --   CREATININE 1.18* 1.10  --   --  0.90 0.90 0.98 1.10 0.91  --   CALCIUM 9.3 9.5  --   --  8.4  --   --   --  8.7  --   MG 1.8  --   --   --  1.7  --  3.0*  --   --  2.7*  PHOS  --   --   --   --   --   --   --   --   --  3.2  < > = values in this interval not displayed.  Liver Function Tests:  Recent Labs Lab 04/05/13 1220 04/07/13 0408 04/07/13 0500  AST 17 107*  --   ALT 11 18  --   ALKPHOS 90  --   --  BILITOT 0.8  --  1.9*  PROT 7.3  --   --   ALBUMIN 3.1*  --   --    No results found for this basename: LIPASE, AMYLASE,  in the last 168 hours No results found for this basename: AMMONIA,  in the last 168 hours  CBC:  Recent Labs Lab 04/05/13 1220 04/06/13 0523  04/06/13 1204  04/06/13 1455 04/06/13 1503 04/06/13 2052 04/06/13 2112 04/07/13 0408  WBC 4.6 6.1  --   --   --  10.8*  --  11.6*  --  10.9*  NEUTROABS 2.5  --   --   --   --   --   --   --   --  9.1*  HGB 12.4 12.7  < > 7.5*  < > 8.5* 9.2* 8.7* 8.5* 9.2*  HCT 36.6 38.3  < > 21.6*  < > 25.0* 27.0* 25.8* 25.0* 27.6*  MCV 92.7 91.8  --   --   --  92.6  --  93.1  --  92.6  PLT 188 191  --  94*  --  123*  122*  --  148*  --  125*  < > = values in this interval not displayed.  INR: 1.39  Recent Labs Lab 04/01/13 04/05/13 1346 04/06/13 1455 04/07/13 0408  INR 1.4 1.14 1.59* 1.39   LDH 392  Coox 79.7% Arterial sat 99%  Imaging: Dg Chest 2 View  04/05/2013   *RADIOLOGY REPORT*  Clinical Data: Preop for ventricular assist device  CHEST - 2 VIEW  Comparison: 03/14/2013  Findings: Pacer / AICD device, leads right atrium right ventricle.  Midline trachea.  Moderate enlargement cardiopericardial silhouette, unchanged. No pleural effusion or  pneumothorax.  No congestive failure.  Clear lungs.  Right jugular line is new in the interval terminates at the mid SVC.  IMPRESSION: Cardiomegaly, without congestive failure or acute disease.   Original Report Authenticated By: Jeronimo Greaves, M.D.   Dg Chest Port 1 View  04/07/2013   *RADIOLOGY REPORT*  Clinical Data: Cardiomyopathy and status post insertion of ventricular assist device.  PORTABLE CHEST - 1 VIEW  Comparison: 04/06/2013  Findings: Endotracheal tube present with the tip approximately 2 cm above the carina.  Swan-Ganz catheter tip lies in the proximal right pulmonary artery and has been retracted.  Bilateral chest tubes are in place without evidence of pneumothorax.  Stable radiographic appearance of implanted defibrillator and visualized ventricular assist device.  No pulmonary edema or significant pleural fluid is identified.  Bibasilar atelectasis present.  IMPRESSION: Bibasilar atelectasis with no evidence of pneumothorax or significant pulmonary edema.  The Swan-Ganz catheter has been retracted into the proximal right pulmonary artery.   Original Report Authenticated By: Irish Lack, M.D.   Dg Chest Port 1 View  04/06/2013   *RADIOLOGY REPORT*  Clinical Data: Postop.  PORTABLE CHEST - 1 VIEW  Comparison: 04/05/2013.  Findings: Endotracheal tube tip 2.5 cm above the carina.  Right-sided Swan-Ganz catheter within the peripheral aspect of the right lower lobe pulmonary artery.  Recommend retracting this by 6 cm to avoid infarct.  AICD / sequential pacemaker enters from the left with leads at the level of the right atrium and right ventricle.  Left ventricular assist device is in place.  Cardiomegaly.  Left internal jugular catheter tip directed slightly laterally at the level of the proximal superior vena cava.  Bilateral chest tubes are in place.  No gross pneumothorax.  Tortuous aorta.  IMPRESSION: Right-sided Swan-Ganz catheter  within the peripheral aspect of the right lower lobe pulmonary  artery.  Recommend retracting this by 6 cm to avoid infarct.  Endotracheal tube tip 2.5 cm above the carina.  AICD / sequential pacemaker enters from the left with leads at the level of the right atrium and right ventricle.  Left ventricular assist device is in place.  Cardiomegaly.  Left internal jugular catheter tip directed slightly laterally at the level of the proximal superior vena cava.  Bilateral chest tubes are in place.  No gross pneumothorax.  Tortuous aorta.  This has been made a PRA call report utilizing dashboard call feature.   Original Report Authenticated By: Lacy Duverney, M.D.    CXR this am with bibasilar atelectasis and mild interstitial edema  Medications:     Scheduled Medications: . acetaminophen  1,000 mg Oral Q6H   Or  . acetaminophen (TYLENOL) oral liquid 160 mg/5 mL  1,000 mg Per Tube Q6H  . amiodarone  200 mg Oral Daily  . aspirin EC  325 mg Oral Daily   Or  . aspirin  324 mg Per Tube Daily   Or  . aspirin  300 mg Rectal Daily  . bisacodyl  10 mg Oral Daily   Or  . bisacodyl  10 mg Rectal Daily  . cefUROXime (ZINACEF)  IV  1.5 g Intravenous Q12H  . cholecalciferol  1,000 Units Oral Daily  . docusate sodium  200 mg Oral Daily  . insulin aspart  0-24 Units Subcutaneous Q4H  . losartan  50 mg Oral Daily  . [START ON 04/08/2013] pantoprazole  40 mg Oral Daily  . potassium chloride  20 mEq Oral BID  . rifampin  600 mg Oral Once  . sildenafil  60 mg Oral TID  . sodium chloride  3 mL Intravenous Q12H  . sodium chloride  3 mL Intravenous Q12H  . vancomycin  1,000 mg Intravenous Q24H  . warfarin  2.5 mg Oral ONCE-1800  . Warfarin - Physician Dosing Inpatient   Does not apply q1800    Infusions: . sodium chloride 20 mL/hr at 04/07/13 0700  . sodium chloride 20 mL/hr at 04/07/13 0700  . sodium chloride    . dexmedetomidine Stopped (04/07/13 0800)  . epinephrine 1 mcg/min (04/07/13 0900)  . heparin 900 Units/hr (04/06/13 0026)  . lactated ringers 20 mL/hr at  04/07/13 0700  . milrinone 0.375 mcg/kg/min (04/07/13 0800)  . nitroGLYCERIN Stopped (04/06/13 1500)  . norepinephrine (LEVOPHED) Adult infusion Stopped (04/06/13 2330)  . phenylephrine (NEO-SYNEPHRINE) Adult infusion Stopped (04/06/13 1500)    PRN Medications: sodium chloride, acetaminophen, albumin human, ALPRAZolam, midazolam, morphine injection, ondansetron (ZOFRAN) IV, oxyCODONE, sodium chloride, sodium chloride   Assessment:   1. Acute on chronic systolic HF due to NICM     --s/p HM II VAD on 04/06/13 2. PAH 3. Chronic AF  - now in NSR    Plan/Discussion:    Doing very well POD #1. Hemodynamics look very good. PA pressures staying down despite NO wean. Agree with plan to wean NO and extubate. Continue milrinone, sildenafil and diuresis (weight up 13 pounds).  Starting coumadin. MAP OK.   I reviewed the LVAD parameters from today, and compared the results to the patient's prior recorded data.  No programming changes were made.  The LVAD is functioning within specified parameters.  The patient performs LVAD self-test daily.  LVAD interrogation was negative for any significant power changes, alarms or PI events/speed drops.  LVAD equipment check completed and is  in good working order.  Back-up equipment present.   LVAD education done on emergency procedures and precautions and reviewed exit site care.  The patient is critically ill with multiple organ systems failure and requires high complexity decision making for assessment and support, frequent evaluation and titration of therapies, application of advanced monitoring technologies and extensive interpretation of multiple databases.   Critical Care Time devoted to patient care services described in this note is 35 Minutes.    Length of Stay: 2  Arvilla Meres 04/07/2013, 9:39 AM  VAD Team Pager [phone] (7am - 7am)

## 2013-04-07 NOTE — Clinical Documentation Improvement (Signed)
THIS DOCUMENT IS NOT A PERMANENT PART OF THE MEDICAL RECORD  Please update your documentation with the medical record to reflect your response to this query. If you need help knowing how to do this please call [phone].  04/07/13   Dear Dr. Laneta Simmers / Associates,  In a better effort to capture your patient's severity of illness, reflect appropriate length of stay and utilization of resources, a review of the patient medical record has revealed the following indicators.    Based on your clinical judgment, please clarify and document in a progress note and/or discharge summary the clinical condition associated with the following supporting information:  In responding to this query please exercise your independent judgment.  The fact that a query is asked, does not imply that any particular answer is desired or expected.    Possible Clinical Conditions?  "     Hypokalemia  "     Other Condition  "     Cannot Clinically Determine      Lab:  8/05 potassium:  2.5  Treatment: 8/06:  kcl 10 meq in 50 ml IV q 1hr x3   You may use possible, probable, or suspect with inpatient documentation. possible, probable, suspected diagnoses MUST be documented at the time of discharge  Reviewed:  no additional documentation provided  Thank You,  Marciano Sequin,  Clinical Documentation Specialist: [phone] Health Information Management [place]

## 2013-04-07 NOTE — Progress Notes (Signed)
Anesthesiology Follow-up:  Ms. Fleer remains sedated on the vent, opens eyes to commands, moving all extremities.   VS: T-36.2 BP 78/69 (73) HR 70 with intermitant V-pacing PA O2 Sat 100%  PA 39/18 CVP 12 SVO2 63% CO/CI 4.2/2.5  VAD:  8790 RPM  Flow: 3.5 L/M Power: 4 watts PI:5  TV 440 FiO2 60 RR 16 PEEP 5   PH 7.38 PCO2 40 PO2 166   K-4.2 BUN/Cr. 9/0.91 WBC 10,900 H/H 9.2/27.2 Plts 125,000  Stable hemodynamically, PA pressures much improved from Pre-op, nitric oxide now weaned off, plan to wean vent today. Uneventful post-op course so far  Kipp Brood, MD

## 2013-04-07 NOTE — Progress Notes (Signed)
OT Cancellation Note  Patient Details Name: Brianna Hanson MRN: [id number] DOB: June 19, 1935   Cancelled Treatment:    Reason Eval/Treat Not Completed: Patient not medically ready  [name] [phone] 04/07/2013, 9:59 AM

## 2013-04-07 NOTE — Progress Notes (Signed)
Pump flow reading ---, PI 6.9-7.3, Power 4-5, Speed 8780. Pt asymptomatic, MAP 75-85, CVP 8-10.  Cherlyn Roberts VAD coordinator notified of PF readings, no new orders received. Will continue to follow closely.

## 2013-04-07 NOTE — Progress Notes (Signed)
LVAD  Resting quietly  BP 121/80  Pulse 80  Temp(Src) 100 F (37.8 C) (Core (Comment))  Resp 30  Ht 4' 11.84" (1.52 m)  Wt 170 lb 6.7 oz (77.3 kg)  BMI 33.46 kg/m2  SpO2 95%  Cardiac index 3.1  Intake/Output Summary (Last 24 hours) at 04/07/13 1737 Last data filed at 04/07/13 1700  Gross per 24 hour  Intake 3638.31 ml  Output   4015 ml  Net -376.69 ml   Continue present care

## 2013-04-07 NOTE — Progress Notes (Signed)
INITIAL NUTRITION ASSESSMENT  DOCUMENTATION CODES Per approved criteria  -Obesity Unspecified   INTERVENTION: If prolonged intubation expected, recommend initiation of nutrition support. If enteral nutrition warranted, initiate Promote enteral formula via enteral feeding tube at 10 ml/hr. This is the goal rate. Add 60 ml Prostat liquid protein via tube QID. Goal regimen will provide: 1040 kcal (23 kcal/kg IBW), 135 grams protein (100% protein needs), 201 ml free water. RD to continue to follow nutrition care plan; will add oral nutrition supplements throughout diet advancement to promote healing.  NUTRITION DIAGNOSIS: Inadequate oral intake related to inability to eat as evidenced by NPO status.   Goal: Initiate nutrition support within 24-48 hours of intubation. Enteral nutrition to provide 60-70% of estimated calorie needs (22-25 kcals/kg ideal body weight) and 100% of estimated protein needs, based on ASPEN guidelines for permissive underfeeding in critically ill obese individuals.  Monitor:  weight trends, lab trends, I/O's, vent status/settings, initiation of nutrition support  Reason for Assessment: MD Consult; New LVAD  78 y.o. female  Admitting Dx: new LVAD  ASSESSMENT: Admitted for LVAD implantation. Pt with left ventricular EF of 15% 2/2 severe non-ischemic cardiomyopathy.  Underwent the following procedures 8/5:  INSERTION OF IMPLANTABLE LEFT VENTRICULAR ASSIST DEVICE  PLACEMENT OF CENTRIMAG VENTRICULAR ASSIST DEVICE  INTRAOPERATIVE TRANSESOPHAGEAL ECHOCARDIOGRAM   Remains intubated at this time. Per RN pt may be extubated today.  Patient is currently intubated on ventilator support.  MV: 8.2 L/min Temp:Temp (24hrs), Avg:97.3 F (36.3 C), Min:96.1 F (35.6 C), Max:98.4 F (36.9 C)  Propofol: none    Height: Ht Readings from Last 1 Encounters:  04/05/13 4' 11.84" (1.52 m)    Weight: Wt Readings from Last 1 Encounters:  04/07/13 170 lb 6.7 oz (77.3 kg)     Ideal Body Weight: 100 lb  % Ideal Body Weight: 170%  Wt Readings from Last 15 Encounters:  04/07/13 170 lb 6.7 oz (77.3 kg)  04/07/13 170 lb 6.7 oz (77.3 kg)  03/30/13 169 lb (76.658 kg)  03/30/13 169 lb (76.658 kg)  03/29/13 169 lb 12.8 oz (77.021 kg)  03/17/13 155 lb 8 oz (70.534 kg)  03/15/13 163 lb 5.8 oz (74.1 kg)  03/12/13 167 lb (75.751 kg)  03/12/13 167 lb (75.751 kg)  03/04/13 167 lb 15.9 oz (76.2 kg)  03/03/13 169 lb (76.658 kg)  03/03/13 169 lb (76.658 kg)  02/17/13 169 lb 12.8 oz (77.021 kg)  02/10/13 169 lb 8 oz (76.885 kg)  02/09/13 170 lb 1.9 oz (77.166 kg)    Usual Body Weight: 165 - 170 lb  % Usual Body Weight: 100%  BMI:  Body mass index is 33.46 kg/(m^2). Obese Class I  Estimated Nutritional Needs: Kcal: 1419 Protein: at least 115 grams Fluid: 1.5 - 1.8 liters daily  Skin:  Chest incision  Diet Order: NPO  EDUCATION NEEDS: -No education needs identified at this time   Intake/Output Summary (Last 24 hours) at 04/07/13 0900 Last data filed at 04/07/13 0800  Gross per 24 hour  Intake 8258.01 ml  Output   5010 ml  Net 3248.01 ml    Last BM: 8/5  Labs:   Recent Labs Lab 04/06/13 0523  04/06/13 1455 04/06/13 1503 04/06/13 2052 04/06/13 2112 04/07/13 0408 04/07/13 0500  NA 140  < > 145 147*  --  148* 145  --   K 4.0  < > 2.5* 2.5*  --  3.3* 4.2  --   CL 107  --  112 109  --  112 115*  --   CO2 21  --  22  --   --   --  23  --   BUN 11  --  9 7  --  7 9  --   CREATININE 1.10  --  0.90 0.90 0.98 1.10 0.91  --   CALCIUM 9.5  --  8.4  --   --   --  8.7  --   MG  --   --  1.7  --  3.0*  --   --  2.7*  PHOS  --   --   --   --   --   --   --  3.2  GLUCOSE 90  < > 118* 115*  --  144* 104*  --   < > = values in this interval not displayed.  CBG (last 3)   Recent Labs  04/07/13 0555 04/07/13 0656 04/07/13 0745  GLUCAP 99 105* 128*    Scheduled Meds: . acetaminophen  1,000 mg Oral Q6H   Or  . acetaminophen (TYLENOL) oral  liquid 160 mg/5 mL  1,000 mg Per Tube Q6H  . amiodarone  200 mg Oral Daily  . aspirin EC  325 mg Oral Daily   Or  . aspirin  324 mg Per Tube Daily   Or  . aspirin  300 mg Rectal Daily  . bisacodyl  10 mg Oral Daily   Or  . bisacodyl  10 mg Rectal Daily  . cefUROXime (ZINACEF)  IV  1.5 g Intravenous Q12H  . cholecalciferol  1,000 Units Oral Daily  . docusate sodium  200 mg Oral Daily  . insulin aspart  0-24 Units Subcutaneous Q4H  . losartan  50 mg Oral Daily  . [START ON 04/08/2013] pantoprazole  40 mg Oral Daily  . potassium chloride  20 mEq Oral BID  . rifampin  600 mg Oral Once  . sildenafil  60 mg Oral TID  . sodium chloride  3 mL Intravenous Q12H  . sodium chloride  3 mL Intravenous Q12H  . vancomycin  1,000 mg Intravenous Q24H  . warfarin  2.5 mg Oral ONCE-1800  . Warfarin - Physician Dosing Inpatient   Does not apply q1800    Continuous Infusions: . sodium chloride 20 mL/hr at 04/07/13 0700  . sodium chloride 20 mL/hr at 04/07/13 0700  . sodium chloride    . dexmedetomidine Stopped (04/07/13 0800)  . epinephrine 1 mcg/min (04/07/13 0800)  . heparin 900 Units/hr (04/06/13 0026)  . lactated ringers 20 mL/hr at 04/07/13 0700  . milrinone 0.375 mcg/kg/min (04/07/13 0800)  . nitroGLYCERIN Stopped (04/06/13 1500)  . norepinephrine (LEVOPHED) Adult infusion Stopped (04/06/13 2330)  . phenylephrine (NEO-SYNEPHRINE) Adult infusion Stopped (04/06/13 1500)    Past Medical History  Diagnosis Date  . Cardiac arrest - ventricular fibrillation 12/10    with successful resucitation, S/p ICD  . ICD (implantable cardiac defibrillator) in place     she has received appropriate therapy for VF  . Atrial fibrillation or flutter   . Osteopenia   . HTN (hypertension)     moderate  . Nonischemic cardiomyopathy     followed by Dr Glori Luis at [facility]  . CHF (congestive heart failure)   . Anemia   . S/P colonoscopy   . Plasma cell disorder 03/20/2012  . Plasma cell disorder 03/20/2012     Past Surgical History  Procedure Laterality Date  . Abdominal hysterectomy    . Cardiac catheterization    .  Cardiac defibrillator placement      by JA for secondary prevention of sudden death    Jarold Motto MS, RD, LDN Pager: [phone] After-hours pager: [phone]

## 2013-04-07 NOTE — Progress Notes (Signed)
PT Cancellation Note  Patient Details Name: Brianna Hanson MRN: [id number] DOB: December 10, 1934   Cancelled Treatment:    Reason Eval/Treat Not Completed: Patient not medically ready   Fabio Asa 04/07/2013, 8:09 AM Charlotte Crumb, PT DPT  [phone]

## 2013-04-07 NOTE — Clinical Documentation Improvement (Signed)
THIS DOCUMENT IS NOT A PERMANENT PART OF THE MEDICAL RECORD  Please update your documentation with the medical record to reflect your response to this query. If you need help knowing how to do this please call [phone].  04/07/13  Dear Dr. Laneta Simmers Marton Redwood  In an effort to better capture your patient's severity of illness, reflect appropriate length of stay and utilization of resources, a review of the patient medical record has revealed the following indicators.    Based on your clinical judgment, please clarify and document in a progress note and/or discharge summary the clinical condition associated with the following supporting information:    Possible Clinical Conditions?   " Expected Acute Blood Loss Anemia  " Acute Blood Loss Anemia  " Acute on chronic blood loss anemia  " Chronic blood loss anemia  " Precipitous drop in Hematocrit  " Other Condition  " Cannot Clinically Determine     Risk Factors:  EBL:  1400 ml per 8/05 Anesthesia record.   Diagnostics: H&H on 8/05:  12.7/38.3 H&H on 8/05:   7.5/21.6  IV fluids / plasma expanders: Cell saver:  587 ml per 8/05 Anesthesia record. FFP:  292 ml per 8/05 Anesthesia record. Plts:  192 ml per 8/05 Anesthesia record. Albumin 5%:  1250 ml per 8/05 Anesthesia record. LR:  2200 ml per 8/05 Anesthesia record.    Reviewed:  no additional documentation provided  Thank You,  Marciano Sequin,  Clinical Documentation Specialist: [phone] Health Information Management [place]

## 2013-04-07 NOTE — Progress Notes (Signed)
Patient ID: Brianna Hanson, female   DOB: 1935-04-06, 78 y.o.   MRN: [id number] HeartMate 2 Rounding Note  Subjective:   Intubated, still on a little Precedex but responds.    LVAD INTERROGATION:  HeartMate II LVAD:  Flow 3.6 liters/min, speed 8800, power 4.1, PI 5.    Objective:    Vital Signs:   Temp:  [96.1 F (35.6 C)-98.4 F (36.9 C)] 97.2 F (36.2 C) (08/06 0645) Pulse Rate:  [28-91] 73 (08/06 0740) Resp:  [0-28] 20 (08/06 0740) SpO2:  [91 %-100 %] 100 % (08/06 0740) Arterial Line BP: (61-109)/(57-80) 85/68 mmHg (08/06 0645) FiO2 (%):  [50 %] 50 % (08/06 0740) Weight:  [77.3 kg (170 lb 6.7 oz)] 77.3 kg (170 lb 6.7 oz) (08/06 0145) Last BM Date: 04/06/13 Mean arterial Pressure 75-80    Milrinone 0.375 Epi 1.5 mcg NO 2 ppm  Intake/Output:   Intake/Output Summary (Last 24 hours) at 04/07/13 0754 Last data filed at 04/07/13 0605  Gross per 24 hour  Intake 8125.81 ml  Output   4970 ml  Net 3155.81 ml     Physical Exam: General:  Well appearing. No resp difficulty HEENT: normal Neck: supple. JVP . Carotids 2+ bilat; no bruits. No lymphadenopathy or thryomegaly appreciated. Cor: Mechanical heart sounds with LVAD hum present. Lungs: clear Abdomen: soft, nontender, nondistended. No hepatosplenomegaly. No bruits or masses. Good bowel sounds. Driveline: C/D/I; securement device intact and driveline incorporated Extremities: no cyanosis, clubbing, rash, edema Neuro: alert & orientedx3, cranial nerves grossly intact. moves all 4 extremities w/o difficulty. Affect pleasant  Telemetry: sinus 72  Labs: Basic Metabolic Panel:  Recent Labs Lab 04/05/13 1220 04/06/13 0523  04/06/13 1251 04/06/13 1455 04/06/13 1503 04/06/13 2052 04/06/13 2112 04/07/13 0408  NA 141 140  < > 142 145 147*  --  148* 145  K 3.5 4.0  < > 2.9* 2.5* 2.5*  --  3.3* 4.2  CL 106 107  --   --  112 109  --  112 115*  CO2 25 21  --   --  22  --   --   --  23  GLUCOSE 86 90  < > 200* 118* 115*   --  144* 104*  BUN 9 11  --   --  9 7  --  7 9  CREATININE 1.18* 1.10  --   --  0.90 0.90 0.98 1.10 0.91  CALCIUM 9.3 9.5  --   --  8.4  --   --   --  8.7  MG 1.8  --   --   --  1.7  --  3.0*  --   --   < > = values in this interval not displayed.  Liver Function Tests:  Recent Labs Lab 04/05/13 1220 04/07/13 0408  AST 17 107*  ALT 11 18  ALKPHOS 90  --   BILITOT 0.8  --   PROT 7.3  --   ALBUMIN 3.1*  --    No results found for this basename: LIPASE, AMYLASE,  in the last 168 hours No results found for this basename: AMMONIA,  in the last 168 hours  CBC:  Recent Labs Lab 04/05/13 1220 04/06/13 0523  04/06/13 1204  04/06/13 1455 04/06/13 1503 04/06/13 2052 04/06/13 2112 04/07/13 0408  WBC 4.6 6.1  --   --   --  10.8*  --  11.6*  --  10.9*  NEUTROABS 2.5  --   --   --   --   --   --   --   --  9.1*  HGB 12.4 12.7  < > 7.5*  < > 8.5* 9.2* 8.7* 8.5* 9.2*  HCT 36.6 38.3  < > 21.6*  < > 25.0* 27.0* 25.8* 25.0* 27.6*  MCV 92.7 91.8  --   --   --  92.6  --  93.1  --  92.6  PLT 188 191  --  94*  --  123*  122*  --  148*  --  125*  < > = values in this interval not displayed.  INR: 1.39  Recent Labs Lab 04/01/13 04/05/13 1346 04/06/13 1455 04/07/13 0408  INR 1.4 1.14 1.59* 1.39   LDH 392  Coox 79.7% Arterial sat 99%  Other results:  EKG:   Imaging: Dg Chest 2 View  04/05/2013   *RADIOLOGY REPORT*  Clinical Data: Preop for ventricular assist device  CHEST - 2 VIEW  Comparison: 03/14/2013  Findings: Pacer / AICD device, leads right atrium right ventricle.  Midline trachea.  Moderate enlargement cardiopericardial silhouette, unchanged. No pleural effusion or pneumothorax.  No congestive failure.  Clear lungs.  Right jugular line is new in the interval terminates at the mid SVC.  IMPRESSION: Cardiomegaly, without congestive failure or acute disease.   Original Report Authenticated By: Jeronimo Greaves, M.D.   Dg Chest Port 1 View  04/06/2013   *RADIOLOGY REPORT*   Clinical Data: Postop.  PORTABLE CHEST - 1 VIEW  Comparison: 04/05/2013.  Findings: Endotracheal tube tip 2.5 cm above the carina.  Right-sided Swan-Ganz catheter within the peripheral aspect of the right lower lobe pulmonary artery.  Recommend retracting this by 6 cm to avoid infarct.  AICD / sequential pacemaker enters from the left with leads at the level of the right atrium and right ventricle.  Left ventricular assist device is in place.  Cardiomegaly.  Left internal jugular catheter tip directed slightly laterally at the level of the proximal superior vena cava.  Bilateral chest tubes are in place.  No gross pneumothorax.  Tortuous aorta.  IMPRESSION: Right-sided Swan-Ganz catheter within the peripheral aspect of the right lower lobe pulmonary artery.  Recommend retracting this by 6 cm to avoid infarct.  Endotracheal tube tip 2.5 cm above the carina.  AICD / sequential pacemaker enters from the left with leads at the level of the right atrium and right ventricle.  Left ventricular assist device is in place.  Cardiomegaly.  Left internal jugular catheter tip directed slightly laterally at the level of the proximal superior vena cava.  Bilateral chest tubes are in place.  No gross pneumothorax.  Tortuous aorta.  This has been made a PRA call report utilizing dashboard call feature.   Original Report Authenticated By: Lacy Duverney, M.D.     CXR this am with bibasilar atelectasis and mild interstitial edema  Medications:     Scheduled Medications: . acetaminophen  1,000 mg Oral Q6H   Or  . acetaminophen (TYLENOL) oral liquid 160 mg/5 mL  1,000 mg Per Tube Q6H  . amiodarone  200 mg Oral Daily  . aspirin EC  325 mg Oral Daily   Or  . aspirin  324 mg Per Tube Daily   Or  . aspirin  300 mg Rectal Daily  . bisacodyl  10 mg Oral Daily   Or  . bisacodyl  10 mg Rectal Daily  . cefUROXime (ZINACEF)  IV  1.5 g Intravenous Q12H  . cholecalciferol  1,000 Units Oral Daily  . docusate sodium  200 mg Oral  Daily  . fluconazole (DIFLUCAN) IV  400 mg Intravenous Once  . insulin aspart  0-24 Units Subcutaneous Q4H  . losartan  50 mg Oral Daily  . [START ON 04/08/2013] pantoprazole  40 mg Oral Daily  . potassium chloride  20 mEq Oral BID  . rifampin  600 mg Oral Once  . sildenafil  60 mg Oral TID  . sodium chloride  3 mL Intravenous Q12H  . sodium chloride  3 mL Intravenous Q12H  . vancomycin  1,000 mg Intravenous Q24H     Infusions: . sodium chloride 20 mL/hr at 04/07/13 0700  . sodium chloride 20 mL/hr at 04/07/13 0700  . sodium chloride    . dexmedetomidine 0.1 mcg/kg/hr (04/07/13 0700)  . epinephrine 1.5 mcg/min (04/07/13 0700)  . heparin 900 Units/hr (04/06/13 0026)  . lactated ringers 20 mL/hr at 04/07/13 0700  . milrinone 0.375 mcg/kg/min (04/07/13 0700)  . nitroGLYCERIN Stopped (04/06/13 1500)  . norepinephrine (LEVOPHED) Adult infusion Stopped (04/06/13 2330)  . phenylephrine (NEO-SYNEPHRINE) Adult infusion Stopped (04/06/13 1500)     PRN Medications:  sodium chloride, acetaminophen, albumin human, ALPRAZolam, midazolam, morphine injection, ondansetron (ZOFRAN) IV, oxyCODONE, sodium chloride, sodium chloride   Assessment:   She has been very stable overnight with normal PA pressures and CVP and good CO as NO has been weaned.   Plan/Discussion:    Wean off NO and wean vent to extubate.  Lasix 40 mg this am. DC femoral A-line. Once extubated and stable DC swan, wean epi as BP allows. Start coumadin today. May be able to remove pleural tubes later today after dangling    I reviewed the LVAD parameters from today, and compared the results to the patient's prior recorded data.  No programming changes were made.  The LVAD is functioning within specified parameters.  The patient performs LVAD self-test daily.  LVAD interrogation was negative for any significant power changes, alarms or PI events/speed drops.  LVAD equipment check completed and is in good working order.   Back-up equipment present.   LVAD education done on emergency procedures and precautions and reviewed exit site care.  Length of Stay: 2  Alleen Borne 04/07/2013, 7:54 AM  VAD Team Pager [phone] (7am - 7am)

## 2013-04-08 ENCOUNTER — Encounter (HOSPITAL_COMMUNITY): Payer: Self-pay | Admitting: Surgery

## 2013-04-08 ENCOUNTER — Inpatient Hospital Stay (HOSPITAL_COMMUNITY): Payer: Medicare Other

## 2013-04-08 DIAGNOSIS — I5023 Acute on chronic systolic (congestive) heart failure: Secondary | ICD-10-CM

## 2013-04-08 DIAGNOSIS — Z95818 Presence of other cardiac implants and grafts: Secondary | ICD-10-CM

## 2013-04-08 LAB — CBC WITH DIFFERENTIAL/PLATELET
Basophils Absolute: 0 10*3/uL (ref 0.0–0.1)
Basophils Relative: 0 % (ref 0–1)
Eosinophils Absolute: 0.4 10*3/uL (ref 0.0–0.7)
Eosinophils Relative: 3 % (ref 0–5)
HCT: 28.3 % — ABNORMAL LOW (ref 36.0–46.0)
Hemoglobin: 9.5 g/dL — ABNORMAL LOW (ref 12.0–15.0)
Lymphocytes Relative: 7 % — ABNORMAL LOW (ref 12–46)
Lymphs Abs: 0.8 10*3/uL (ref 0.7–4.0)
MCH: 31.5 pg (ref 26.0–34.0)
MCHC: 33.6 g/dL (ref 30.0–36.0)
MCV: 93.7 fL (ref 78.0–100.0)
Monocytes Absolute: 1.3 10*3/uL — ABNORMAL HIGH (ref 0.1–1.0)
Monocytes Relative: 11 % (ref 3–12)
Neutro Abs: 9.9 10*3/uL — ABNORMAL HIGH (ref 1.7–7.7)
Neutrophils Relative %: 79 % — ABNORMAL HIGH (ref 43–77)
Platelets: 108 10*3/uL — ABNORMAL LOW (ref 150–400)
RBC: 3.02 MIL/uL — ABNORMAL LOW (ref 3.87–5.11)
RDW: 17.7 % — ABNORMAL HIGH (ref 11.5–15.5)
WBC: 12.5 10*3/uL — ABNORMAL HIGH (ref 4.0–10.5)

## 2013-04-08 LAB — POCT I-STAT, CHEM 8
BUN: 6 mg/dL (ref 6–23)
Calcium, Ion: 1.21 mmol/L (ref 1.13–1.30)
Chloride: 111 mEq/L (ref 96–112)
Creatinine, Ser: 0.9 mg/dL (ref 0.50–1.10)
Glucose, Bld: 91 mg/dL (ref 70–99)
HCT: 28 % — ABNORMAL LOW (ref 36.0–46.0)
Hemoglobin: 9.5 g/dL — ABNORMAL LOW (ref 12.0–15.0)
Potassium: 3.8 mEq/L (ref 3.5–5.1)
Sodium: 146 mEq/L — ABNORMAL HIGH (ref 135–145)
TCO2: 21 mmol/L (ref 0–100)

## 2013-04-08 LAB — BASIC METABOLIC PANEL
BUN: 8 mg/dL (ref 6–23)
CO2: 23 mEq/L (ref 19–32)
Calcium: 8.9 mg/dL (ref 8.4–10.5)
Chloride: 110 mEq/L (ref 96–112)
Creatinine, Ser: 0.85 mg/dL (ref 0.50–1.10)
GFR calc Af Amer: 75 mL/min — ABNORMAL LOW (ref >90)
GFR calc non Af Amer: 65 mL/min — ABNORMAL LOW (ref >90)
Glucose, Bld: 87 mg/dL (ref 70–99)
Potassium: 3.9 mEq/L (ref 3.5–5.1)
Sodium: 141 mEq/L (ref 135–145)

## 2013-04-08 LAB — POCT I-STAT 3, ART BLOOD GAS (G3+)
Acid-base deficit: 1 mmol/L (ref 0.0–2.0)
Acid-base deficit: 1 mmol/L (ref 0.0–2.0)
Acid-base deficit: 2 mmol/L (ref 0.0–2.0)
Bicarbonate: 22.9 mEq/L (ref 20.0–24.0)
Bicarbonate: 23.2 mEq/L (ref 20.0–24.0)
Bicarbonate: 22.4 mEq/L (ref 20.0–24.0)
O2 Saturation: 95 %
O2 Saturation: 89 %
O2 Saturation: 94 %
Patient temperature: 37.8
Patient temperature: 99
Patient temperature: 99
TCO2: 24 mmol/L (ref 0–100)
TCO2: 24 mmol/L (ref 0–100)
TCO2: 23 mmol/L (ref 0–100)
pCO2 arterial: 35.9 mmHg (ref 35.0–45.0)
pCO2 arterial: 33.8 mmHg — ABNORMAL LOW (ref 35.0–45.0)
pCO2 arterial: 36.2 mmHg (ref 35.0–45.0)
pH, Arterial: 7.416 (ref 7.350–7.450)
pH, Arterial: 7.445 (ref 7.350–7.450)
pH, Arterial: 7.4 (ref 7.350–7.450)
pO2, Arterial: 78 mmHg — ABNORMAL LOW (ref 80.0–100.0)
pO2, Arterial: 55 mmHg — ABNORMAL LOW (ref 80.0–100.0)
pO2, Arterial: 70 mmHg — ABNORMAL LOW (ref 80.0–100.0)

## 2013-04-08 LAB — GLUCOSE, CAPILLARY
Glucose-Capillary: 72 mg/dL (ref 70–99)
Glucose-Capillary: 73 mg/dL (ref 70–99)
Glucose-Capillary: 73 mg/dL (ref 70–99)
Glucose-Capillary: 75 mg/dL (ref 70–99)
Glucose-Capillary: 81 mg/dL (ref 70–99)
Glucose-Capillary: 92 mg/dL (ref 70–99)

## 2013-04-08 LAB — CARBOXYHEMOGLOBIN
Carboxyhemoglobin: 1.7 % — ABNORMAL HIGH (ref 0.5–1.5)
Methemoglobin: 0.8 % (ref 0.0–1.5)
O2 Saturation: 65.3 %
Total hemoglobin: 11.1 g/dL — ABNORMAL LOW (ref 12.0–16.0)

## 2013-04-08 LAB — MAGNESIUM: Magnesium: 2.3 mg/dL (ref 1.5–2.5)

## 2013-04-08 LAB — POTASSIUM: Potassium: 4.1 mEq/L (ref 3.5–5.1)

## 2013-04-08 LAB — PHOSPHORUS: Phosphorus: 3.4 mg/dL (ref 2.3–4.6)

## 2013-04-08 LAB — PROTIME-INR
INR: 1.51 — ABNORMAL HIGH (ref 0.00–1.49)
Prothrombin Time: 17.8 seconds — ABNORMAL HIGH (ref 11.6–15.2)

## 2013-04-08 LAB — LACTATE DEHYDROGENASE: LDH: 547 U/L — ABNORMAL HIGH (ref 94–250)

## 2013-04-08 MED ORDER — FUROSEMIDE 10 MG/ML IJ SOLN
40.0000 mg | Freq: Once | INTRAMUSCULAR | Status: AC
  Administered 2013-04-08: 40 mg via INTRAVENOUS

## 2013-04-08 MED ORDER — POTASSIUM CHLORIDE 10 MEQ/50ML IV SOLN
10.0000 meq | INTRAVENOUS | Status: AC
  Administered 2013-04-08 (×3): 10 meq via INTRAVENOUS
  Filled 2013-04-08: qty 100

## 2013-04-08 MED ORDER — CARVEDILOL 3.125 MG PO TABS
3.1250 mg | ORAL_TABLET | Freq: Two times a day (BID) | ORAL | Status: DC
  Administered 2013-04-08 – 2013-04-14 (×13): 3.125 mg via ORAL
  Filled 2013-04-08 (×17): qty 1

## 2013-04-08 MED ORDER — AMIODARONE IV BOLUS ONLY 150 MG/100ML
150.0000 mg | Freq: Once | INTRAVENOUS | Status: AC
  Administered 2013-04-08: 150 mg via INTRAVENOUS

## 2013-04-08 MED ORDER — AMIODARONE HCL IN DEXTROSE 360-4.14 MG/200ML-% IV SOLN
INTRAVENOUS | Status: AC
  Administered 2013-04-08: 200 mL via INTRAVENOUS
  Filled 2013-04-08: qty 200

## 2013-04-08 MED ORDER — ENALAPRIL MALEATE 2.5 MG PO TABS: 2.5000 mg | ORAL_TABLET | Freq: Two times a day (BID) | ORAL | Status: DC

## 2013-04-08 MED ORDER — TORSEMIDE 20 MG PO TABS
20.0000 mg | ORAL_TABLET | Freq: Every day | ORAL | Status: DC
  Filled 2013-04-08: qty 1

## 2013-04-08 MED ORDER — POTASSIUM CHLORIDE 10 MEQ/50ML IV SOLN
10.0000 meq | INTRAVENOUS | Status: AC
  Administered 2013-04-08 (×4): 10 meq via INTRAVENOUS

## 2013-04-08 MED ORDER — TRAMADOL HCL 50 MG PO TABS
50.0000 mg | ORAL_TABLET | Freq: Four times a day (QID) | ORAL | Status: DC | PRN
  Administered 2013-04-10 – 2013-04-22 (×16): 50 mg via ORAL
  Filled 2013-04-08 (×18): qty 1

## 2013-04-08 MED ORDER — POTASSIUM CHLORIDE 10 MEQ/50ML IV SOLN
INTRAVENOUS | Status: AC
  Filled 2013-04-08: qty 200

## 2013-04-08 MED ORDER — WARFARIN SODIUM 2.5 MG PO TABS
2.5000 mg | ORAL_TABLET | Freq: Once | ORAL | Status: AC
  Administered 2013-04-08: 2.5 mg via ORAL
  Filled 2013-04-08: qty 1

## 2013-04-08 NOTE — Progress Notes (Signed)
Progress Note from the Palliative Medicine Team at [facility]  Subjective:patient awake, alert and oriented, "feels good"     Objective: No Known Allergies Scheduled Meds: . acetaminophen  1,000 mg Oral Q6H   Or  . acetaminophen (TYLENOL) oral liquid 160 mg/5 mL  1,000 mg Per Tube Q6H  . amiodarone  200 mg Oral Daily  . aspirin EC  325 mg Oral Daily   Or  . aspirin  324 mg Per Tube Daily   Or  . aspirin  300 mg Rectal Daily  . bisacodyl  10 mg Oral Daily   Or  . bisacodyl  10 mg Rectal Daily  . cholecalciferol  1,000 Units Oral Daily  . docusate sodium  200 mg Oral Daily  . insulin aspart  0-24 Units Subcutaneous Q4H  . losartan  50 mg Oral Daily  . pantoprazole  40 mg Oral Daily  . potassium chloride  10 mEq Intravenous Q1 Hr x 4  . rifampin  600 mg Oral Once  . sildenafil  60 mg Oral TID  . sodium chloride  3 mL Intravenous Q12H  . sodium chloride  3 mL Intravenous Q12H  . warfarin  2.5 mg Oral ONCE-1800  . Warfarin - Physician Dosing Inpatient   Does not apply q1800   Continuous Infusions: . sodium chloride 20 mL/hr at 04/07/13 0700  . sodium chloride 20 mL/hr at 04/07/13 0700  . sodium chloride    . lactated ringers 20 mL/hr at 04/07/13 1958  . milrinone 0.375 mcg/kg/min (04/07/13 2013)   PRN Meds:.sodium chloride, acetaminophen, ALPRAZolam, morphine injection, ondansetron (ZOFRAN) IV, oxyCODONE, sodium chloride, sodium chloride  BP 121/80  Pulse 97  Temp(Src) 98.6 F (37 C) (Oral)  Resp 24  Ht 4' 11.84" (1.52 m)  Wt 77.2 kg (170 lb 3.1 oz)  BMI 33.41 kg/m2  SpO2 94%     Intake/Output Summary (Last 24 hours) at 04/08/13 0854 Last data filed at 04/08/13 0837  Gross per 24 hour  Intake 2244.6 ml  Output   3975 ml  Net -1730.4 ml       Physical Exam:  General: awake, comfortable, NAD HEENT:  Mm, no exudate Chest:  Lungs CTA CVS: Noted LVAD hum Abdomen: soft NT +BS Ext: trace BLE edema Neuro: alert and oriented X3 Psych: in good spirits,  verbalizes positive statements regarding procedures and hopes for the future   Labs: CBC    Component Value Date/Time   WBC 12.5* 04/08/2013 0400   WBC 4.8 02/10/2013 1053   RBC 3.02* 04/08/2013 0400   RBC 4.43 02/10/2013 1053   HGB 9.5* 04/08/2013 0400   HGB 13.8 02/10/2013 1053   HCT 28.3* 04/08/2013 0400   HCT 41.2 02/10/2013 1053   PLT 108* 04/08/2013 0400   PLT 167 02/10/2013 1053   MCV 93.7 04/08/2013 0400   MCV 93.1 02/10/2013 1053   MCH 31.5 04/08/2013 0400   MCH 31.1 02/10/2013 1053   MCHC 33.6 04/08/2013 0400   MCHC 33.4 02/10/2013 1053   RDW 17.7* 04/08/2013 0400   RDW 16.0* 02/10/2013 1053   LYMPHSABS 0.8 04/08/2013 0400   LYMPHSABS 1.7 02/10/2013 1053   MONOABS 1.3* 04/08/2013 0400   MONOABS 0.8 02/10/2013 1053   EOSABS 0.4 04/08/2013 0400   EOSABS 0.1 02/10/2013 1053   BASOSABS 0.0 04/08/2013 0400   BASOSABS 0.1 02/10/2013 1053    BMET    Component Value Date/Time   NA 141 04/08/2013 0400   NA 143 02/10/2013 1053   K  3.9 04/08/2013 0400   K 3.7 02/10/2013 1053   CL 110 04/08/2013 0400   CL 110* 02/10/2013 1053   CO2 23 04/08/2013 0400   CO2 25 02/10/2013 1053   GLUCOSE 87 04/08/2013 0400   GLUCOSE 97 02/10/2013 1053   BUN 8 04/08/2013 0400   BUN 17.5 02/10/2013 1053   CREATININE 0.85 04/08/2013 0400   CREATININE 1.4* 02/10/2013 1053   CALCIUM 8.9 04/08/2013 0400   CALCIUM 8.9 02/10/2013 1053   GFRNONAA 65* 04/08/2013 0400   GFRAA 75* 04/08/2013 0400    CMP     Component Value Date/Time   NA 141 04/08/2013 0400   NA 143 02/10/2013 1053   K 3.9 04/08/2013 0400   K 3.7 02/10/2013 1053   CL 110 04/08/2013 0400   CL 110* 02/10/2013 1053   CO2 23 04/08/2013 0400   CO2 25 02/10/2013 1053   GLUCOSE 87 04/08/2013 0400   GLUCOSE 97 02/10/2013 1053   BUN 8 04/08/2013 0400   BUN 17.5 02/10/2013 1053   CREATININE 0.85 04/08/2013 0400   CREATININE 1.4* 02/10/2013 1053   CALCIUM 8.9 04/08/2013 0400   CALCIUM 8.9 02/10/2013 1053   PROT 7.3 04/05/2013 1220   PROT 6.9 02/10/2013 1053   ALBUMIN 3.1* 04/05/2013 1220   ALBUMIN 3.0*  02/10/2013 1053   AST 107* 04/07/2013 0408   AST 22 02/10/2013 1053   ALT 18 04/07/2013 0408   ALT 21 02/10/2013 1053   ALKPHOS 90 04/05/2013 1220   ALKPHOS 99 02/10/2013 1053   BILITOT 1.9* 04/07/2013 0500   BILITOT 0.67 02/10/2013 1053   GFRNONAA 65* 04/08/2013 0400   GFRAA 75* 04/08/2013 0400   PMT will continue to support hiolistically  Time In Time Out Total Time Spent with Patient Total Overall Time  1315 1335 20 min 20 min    Greater than 50%  of this time was spent counseling and coordinating care related to the above assessment and plan.  Lorinda Creed NP  Palliative Medicine Team Team Phone # [phone] Pager [phone]   1

## 2013-04-08 NOTE — Plan of Care (Signed)
Problem: Phase II Progression Outcomes Goal: Tolerates weaning with O2 Sat > 90 Outcome: Progressing Progressed to 2L Colp for O 2. Goal: Pain controlled with appropriate interventions Outcome: Progressing Pain is controlled with minimal Morphine. Will progress to PO when diet is tolerated. Goal: Tolerates liquids without nausea/vomiting Outcome: Progressing Pt started on PO sips and ice chips.

## 2013-04-08 NOTE — Progress Notes (Signed)
   VT quiescent with amio and reducing VAD speed. Will continue to follow.  Change IV lasix to po torsemide 20 daily.   Daniel [name],MD 6:10 PM

## 2013-04-08 NOTE — Progress Notes (Signed)
Patient ID: Brianna Hanson, female   DOB: 08-Jun-1935, 78 y.o.   MRN: [id number] HeartMate 2 Rounding Note  Subjective:    78 y/o female with severe CHF due to NICM, PAH, chronic AF underwent HM II VAD implant 04/06/13  Extubated. Ernestine Conrad out. Looks good. PTs out. Off EPI and NO. On milrinone 0.375. CVP ~8. MAPs 80s. Still in sinus with PACs and PVCs.  No alarms or PI events. Good urine output but weight stable.   Co-ox 65  LVAD INTERROGATION:  HeartMate II LVAD:  Flow -- liters/min, speed 8800, power 4.5, PI 7.3   No alarms or PI events overnight  Objective:    Vital Signs:   Temp:  [97 F (36.1 C)-100 F (37.8 C)] 99 F (37.2 C) (08/07 0401) Pulse Rate:  [26-91] 91 (08/07 0700) Resp:  [5-38] 26 (08/07 0700) SpO2:  [90 %-100 %] 96 % (08/07 0700) Arterial Line BP: (78-131)/(65-83) 104/69 mmHg (08/07 0700) FiO2 (%):  [40 %-60 %] 40 % (08/06 1200) Weight:  [77.2 kg (170 lb 3.1 oz)] 77.2 kg (170 lb 3.1 oz) (08/07 0530) Last BM Date: 04/06/13 Mean arterial Pressure 80s   Intake/Output:   Intake/Output Summary (Last 24 hours) at 04/08/13 0734 Last data filed at 04/08/13 0700  Gross per 24 hour  Intake   2200 ml  Output   3440 ml  Net  -1240 ml     Physical Exam: General: Awake comfortable HEENT: Normal Neck: supple. JVP 8 . Carotids 2+ bilat; no bruits. No lymphadenopathy or thryomegaly appreciated. Cor: Mechanical heart sounds with LVAD hum present. Dressing c/d/i Lungs: clear Abdomen: soft, nontender, nondistended. Quiet Driveline: C/D/I; securement device intact and driveline incorporated Extremities: no cyanosis, clubbing, rash, 1+ edema ACDs Neuro: awake alert. Pleasant. nonfocal  Telemetry: sinus 90 with PACs/PVCs  Labs: Basic Metabolic Panel:  Recent Labs Lab 04/05/13 1220 04/06/13 0523  04/06/13 1455 04/06/13 1503 04/06/13 2052 04/06/13 2112 04/07/13 0408 04/07/13 0500 04/07/13 1628 04/07/13 1630 04/08/13 0400  NA 141 140  < > 145 147*  --  148* 145   --  146*  --  141  K 3.5 4.0  < > 2.5* 2.5*  --  3.3* 4.2  --  3.8  --  3.9  CL 106 107  --  112 109  --  112 115*  --  111  --  110  CO2 25 21  --  22  --   --   --  23  --   --   --  23  GLUCOSE 86 90  < > 118* 115*  --  144* 104*  --  91  --  87  BUN 9 11  --  9 7  --  7 9  --  6  --  8  CREATININE 1.18* 1.10  --  0.90 0.90 0.98 1.10 0.91  --  0.90 0.86 0.85  CALCIUM 9.3 9.5  --  8.4  --   --   --  8.7  --   --   --  8.9  MG 1.8  --   --  1.7  --  3.0*  --   --  2.7*  --  2.3 2.3  PHOS  --   --   --   --   --   --   --   --  3.2  --   --  3.4  < > = values in this interval not displayed.  Liver Function Tests:  Recent Labs Lab 04/05/13 1220 04/07/13 0408 04/07/13 0500  AST 17 107*  --   ALT 11 18  --   ALKPHOS 90  --   --   BILITOT 0.8  --  1.9*  PROT 7.3  --   --   ALBUMIN 3.1*  --   --    No results found for this basename: LIPASE, AMYLASE,  in the last 168 hours No results found for this basename: AMMONIA,  in the last 168 hours  CBC:  Recent Labs Lab 04/05/13 1220  04/06/13 1455  04/06/13 2052 04/06/13 2112 04/07/13 0408 04/07/13 1628 04/07/13 1630 04/08/13 0400  WBC 4.6  < > 10.8*  --  11.6*  --  10.9*  --  10.6* 12.5*  NEUTROABS 2.5  --   --   --   --   --  9.1*  --   --  9.9*  HGB 12.4  < > 8.5*  < > 8.7* 8.5* 9.2* 9.5* 9.5* 9.5*  HCT 36.6  < > 25.0*  < > 25.8* 25.0* 27.6* 28.0* 28.2* 28.3*  MCV 92.7  < > 92.6  --  93.1  --  92.6  --  93.4 93.7  PLT 188  < > 123*  122*  --  148*  --  125*  --  111* 108*  < > = values in this interval not displayed.  INR: 1.39  Recent Labs Lab 04/05/13 1346 04/06/13 1455 04/07/13 0408 04/08/13 0400  INR 1.14 1.59* 1.39 1.51*   LDH 392  Coox 79.7% Arterial sat 99%  Imaging: Dg Chest Port 1 View  04/07/2013   *RADIOLOGY REPORT*  Clinical Data: Cardiomyopathy and status post insertion of ventricular assist device.  PORTABLE CHEST - 1 VIEW  Comparison: 04/06/2013  Findings: Endotracheal tube present with the tip  approximately 2 cm above the carina.  Swan-Ganz catheter tip lies in the proximal right pulmonary artery and has been retracted.  Bilateral chest tubes are in place without evidence of pneumothorax.  Stable radiographic appearance of implanted defibrillator and visualized ventricular assist device.  No pulmonary edema or significant pleural fluid is identified.  Bibasilar atelectasis present.  IMPRESSION: Bibasilar atelectasis with no evidence of pneumothorax or significant pulmonary edema.  The Swan-Ganz catheter has been retracted into the proximal right pulmonary artery.   Original Report Authenticated By: Irish Lack, M.D.   Dg Chest Port 1 View  04/06/2013   *RADIOLOGY REPORT*  Clinical Data: Postop.  PORTABLE CHEST - 1 VIEW  Comparison: 04/05/2013.  Findings: Endotracheal tube tip 2.5 cm above the carina.  Right-sided Swan-Ganz catheter within the peripheral aspect of the right lower lobe pulmonary artery.  Recommend retracting this by 6 cm to avoid infarct.  AICD / sequential pacemaker enters from the left with leads at the level of the right atrium and right ventricle.  Left ventricular assist device is in place.  Cardiomegaly.  Left internal jugular catheter tip directed slightly laterally at the level of the proximal superior vena cava.  Bilateral chest tubes are in place.  No gross pneumothorax.  Tortuous aorta.  IMPRESSION: Right-sided Swan-Ganz catheter within the peripheral aspect of the right lower lobe pulmonary artery.  Recommend retracting this by 6 cm to avoid infarct.  Endotracheal tube tip 2.5 cm above the carina.  AICD / sequential pacemaker enters from the left with leads at the level of the right atrium and right ventricle.  Left ventricular assist device is in place.  Cardiomegaly.  Left internal  jugular catheter tip directed slightly laterally at the level of the proximal superior vena cava.  Bilateral chest tubes are in place.  No gross pneumothorax.  Tortuous aorta.  This has been  made a PRA call report utilizing dashboard call feature.   Original Report Authenticated By: Lacy Duverney, M.D.    CXR this am with bibasilar atelectasis and mild interstitial edema  Medications:     Scheduled Medications: . acetaminophen  1,000 mg Oral Q6H   Or  . acetaminophen (TYLENOL) oral liquid 160 mg/5 mL  1,000 mg Per Tube Q6H  . amiodarone  200 mg Oral Daily  . aspirin EC  325 mg Oral Daily   Or  . aspirin  324 mg Per Tube Daily   Or  . aspirin  300 mg Rectal Daily  . bisacodyl  10 mg Oral Daily   Or  . bisacodyl  10 mg Rectal Daily  . cholecalciferol  1,000 Units Oral Daily  . docusate sodium  200 mg Oral Daily  . insulin aspart  0-24 Units Subcutaneous Q4H  . losartan  50 mg Oral Daily  . pantoprazole  40 mg Oral Daily  . potassium chloride  10 mEq Intravenous Q1 Hr x 4  . rifampin  600 mg Oral Once  . sildenafil  60 mg Oral TID  . sodium chloride  3 mL Intravenous Q12H  . sodium chloride  3 mL Intravenous Q12H  . Warfarin - Physician Dosing Inpatient   Does not apply q1800    Infusions: . sodium chloride 20 mL/hr at 04/07/13 0700  . sodium chloride 20 mL/hr at 04/07/13 0700  . sodium chloride    . epinephrine Stopped (04/07/13 1300)  . lactated ringers 20 mL/hr at 04/07/13 1958  . milrinone 0.375 mcg/kg/min (04/07/13 2013)  . nitroGLYCERIN Stopped (04/06/13 1500)    PRN Medications: sodium chloride, acetaminophen, ALPRAZolam, morphine injection, ondansetron (ZOFRAN) IV, oxyCODONE, sodium chloride, sodium chloride   Assessment:   1. Acute on chronic systolic HF due to NICM     --s/p HM II VAD on 04/06/13 2. PAH 3. Chronic AF  - now in NSR    Plan/Discussion:    Doing very well POD #2. Would continue milrinone at current dose. Continue to diurese slowly. Mobilize. Coumadin started. VAD flows remain low but no PI events or other alarms so would not change anything at this point. Will need echo in next few days. May be able to increases speed slightly  at some point but given RV would keep where it is for now.   I reviewed the LVAD parameters from today, and compared the results to the patient's prior recorded data.  No programming changes were made.  The LVAD is functioning within specified parameters.  The patient performs LVAD self-test daily.  LVAD interrogation was negative for any significant power changes, alarms or PI events/speed drops.  LVAD equipment check completed and is in good working order.  Back-up equipment present.   LVAD education done on emergency procedures and precautions and reviewed exit site care.  The patient is critically ill with multiple organ systems failure and requires high complexity decision making for assessment and support, frequent evaluation and titration of therapies, application of advanced monitoring technologies and extensive interpretation of multiple databases.   Critical Care Time devoted to patient care services described in this note is 35 Minutes.   Length of Stay: 3  Arvilla Meres 04/08/2013, 7:34 AM  VAD Team Pager [phone] (7am - 7am)

## 2013-04-08 NOTE — Plan of Care (Signed)
Problem: Phase I Progression Outcomes Goal: Hemodynamically stable Outcome: Completed/Met Date Met:  04/08/13 Stable at home on Milrinone.

## 2013-04-08 NOTE — Progress Notes (Signed)
Patient ID: Brianna Hanson, female   DOB: [birth year], 78 y.o.   MRN: [id number] HeartMate 2 Rounding Note  Subjective:   No complaints  LVAD INTERROGATION:  HeartMate II LVAD:  Flow --- liters/min, speed 8800, power 5, PI 7.    Objective:    Vital Signs:   Temp:  [98.2 F (36.8 C)-100 F (37.8 C)] 98.7 F (37.1 C) (08/07 1600) Pulse Rate:  [48-97] 94 (08/07 1600) Resp:  [18-37] 24 (08/07 1600) SpO2:  [90 %-98 %] 97 % (08/07 1600) Arterial Line BP: (88-131)/(65-80) 103/72 mmHg (08/07 0900) Weight:  [77.2 kg (170 lb 3.1 oz)] 77.2 kg (170 lb 3.1 oz) (08/07 0530) Last BM Date: 04/06/13 Mean arterial Pressure 104    Milrinone 0.375   Intake/Output:   Intake/Output Summary (Last 24 hours) at 04/08/13 1629 Last data filed at 04/08/13 1600  Gross per 24 hour  Intake   2175 ml  Output   2995 ml  Net   -820 ml     Physical Exam: General:  Well appearing. No resp difficulty HEENT: normal Neck: supple.  Carotids 2+ bilat; no bruits. No lymphadenopathy or thryomegaly appreciated. Cor: LVAD hum present. Lungs: clear Abdomen: soft, nontender, nondistended. No hepatosplenomegaly. No bruits or masses. Good bowel sounds. Driveline: C/D/I; securement device intact and driveline incorporated, small amount of bloody drainage on dressing. Extremities: mild peripheral edema Neuro: alert & orientedx3, cranial nerves grossly intact. moves all 4 extremities w/o difficulty. Affect pleasant  Telemetry: sinus 72  Labs: Basic Metabolic Panel:  Recent Labs Lab 04/05/13 1220 04/06/13 0523  04/06/13 1455 04/06/13 1503 04/06/13 2052 04/06/13 2112 04/07/13 0408 04/07/13 0500 04/07/13 1628 04/07/13 1630 04/08/13 0400 04/08/13 1400  NA 141 140  < > 145 147*  --  148* 145  --  146*  --  141  --   K 3.5 4.0  < > 2.5* 2.5*  --  3.3* 4.2  --  3.8  --  3.9 4.1  CL 106 107  --  112 109  --  112 115*  --  111  --  110  --   CO2 25 21  --  22  --   --   --  23  --   --   --  23  --   GLUCOSE  86 90  < > 118* 115*  --  144* 104*  --  91  --  87  --   BUN 9 11  --  9 7  --  7 9  --  6  --  8  --   CREATININE 1.18* 1.10  --  0.90 0.90 0.98 1.10 0.91  --  0.90 0.86 0.85  --   CALCIUM 9.3 9.5  --  8.4  --   --   --  8.7  --   --   --  8.9  --   MG 1.8  --   --  1.7  --  3.0*  --   --  2.7*  --  2.3 2.3  --   PHOS  --   --   --   --   --   --   --   --  3.2  --   --  3.4  --   < > = values in this interval not displayed.  Liver Function Tests:  Recent Labs Lab 04/05/13 1220 04/07/13 0408 04/07/13 0500  AST 17 107*  --   ALT 11 18  --  ALKPHOS 90  --   --   BILITOT 0.8  --  1.9*  PROT 7.3  --   --   ALBUMIN 3.1*  --   --    No results found for this basename: LIPASE, AMYLASE,  in the last 168 hours No results found for this basename: AMMONIA,  in the last 168 hours  CBC:  Recent Labs Lab 04/05/13 1220  04/06/13 1455  04/06/13 2052 04/06/13 2112 04/07/13 0408 04/07/13 1628 04/07/13 1630 04/08/13 0400  WBC 4.6  < > 10.8*  --  11.6*  --  10.9*  --  10.6* 12.5*  NEUTROABS 2.5  --   --   --   --   --  9.1*  --   --  9.9*  HGB 12.4  < > 8.5*  < > 8.7* 8.5* 9.2* 9.5* 9.5* 9.5*  HCT 36.6  < > 25.0*  < > 25.8* 25.0* 27.6* 28.0* 28.2* 28.3*  MCV 92.7  < > 92.6  --  93.1  --  92.6  --  93.4 93.7  PLT 188  < > 123*  122*  --  148*  --  125*  --  111* 108*  < > = values in this interval not displayed.  INR: 1.39  Recent Labs Lab 04/05/13 1346 04/06/13 1455 04/07/13 0408 04/08/13 0400  INR 1.14 1.59* 1.39 1.51*   LDH 392 <547  Coox 65% Arterial sat 99%  Other results:  EKG:   Imaging: Dg Chest Port 1 View  04/08/2013   *RADIOLOGY REPORT*  Clinical Data: Post left ventricular assist device.  PORTABLE CHEST - 1 VIEW  Comparison: 04/07/2013.  Findings: Portion of left ventricular assist device is noted. Mediastinal drain remains in place.  Chest tubes have been removed. No gross pneumothorax.  Swan-Ganz catheter removed.  Introducer at the level of the proximal  superior vena cava.  Left central line remains in place with the tip directed laterally at the level of the proximal superior vena cava.  Cardiomegaly.  Pulmonary edema.  Small bilateral pleural effusions. Basilar consolidation may represent atelectasis and pleural effusions although limiting detection of underlying infiltrate or mass.  Catheter overlies the right upper abdomen.  Etiology indeterminate.  IMPRESSION: Chest tubes, nasogastric tube and Swan-Ganz catheter have been removed.  No gross pneumothorax.  Pulmonary edema and pleural effusions.  Cardiomegaly with left ventricular assist device in place.  Left central line remains in place with the tip directed laterally at the level of the proximal superior vena cava.   Original Report Authenticated By: Lacy Duverney, M.D.   Dg Chest Port 1 View  04/07/2013   *RADIOLOGY REPORT*  Clinical Data: Cardiomyopathy and status post insertion of ventricular assist device.  PORTABLE CHEST - 1 VIEW  Comparison: 04/06/2013  Findings: Endotracheal tube present with the tip approximately 2 cm above the carina.  Swan-Ganz catheter tip lies in the proximal right pulmonary artery and has been retracted.  Bilateral chest tubes are in place without evidence of pneumothorax.  Stable radiographic appearance of implanted defibrillator and visualized ventricular assist device.  No pulmonary edema or significant pleural fluid is identified.  Bibasilar atelectasis present.  IMPRESSION: Bibasilar atelectasis with no evidence of pneumothorax or significant pulmonary edema.  The Swan-Ganz catheter has been retracted into the proximal right pulmonary artery.   Original Report Authenticated By: Irish Lack, M.D.    CXR this am with bibasilar atelectasis and mild interstitial edema  Medications:     Scheduled Medications: . acetaminophen  1,000 mg Oral Q6H   Or  . acetaminophen (TYLENOL) oral liquid 160 mg/5 mL  1,000 mg Per Tube Q6H  . amiodarone  200 mg Oral Daily  .  aspirin EC  325 mg Oral Daily   Or  . aspirin  324 mg Per Tube Daily   Or  . aspirin  300 mg Rectal Daily  . bisacodyl  10 mg Oral Daily   Or  . bisacodyl  10 mg Rectal Daily  . carvedilol  3.125 mg Oral BID WC  . cholecalciferol  1,000 Units Oral Daily  . docusate sodium  200 mg Oral Daily  . insulin aspart  0-24 Units Subcutaneous Q4H  . losartan  50 mg Oral Daily  . pantoprazole  40 mg Oral Daily  . rifampin  600 mg Oral Once  . sildenafil  60 mg Oral TID  . sodium chloride  3 mL Intravenous Q12H  . sodium chloride  3 mL Intravenous Q12H  . warfarin  2.5 mg Oral ONCE-1800  . Warfarin - Physician Dosing Inpatient   Does not apply q1800    Infusions: . sodium chloride 20 mL/hr at 04/07/13 0700  . sodium chloride 20 mL/hr at 04/07/13 0700  . sodium chloride    . lactated ringers 20 mL/hr at 04/08/13 1406  . milrinone 0.375 mcg/kg/min (04/08/13 1003)    PRN Medications: sodium chloride, acetaminophen, ALPRAZolam, morphine injection, ondansetron (ZOFRAN) IV, oxyCODONE, sodium chloride, sodium chloride   Assessment:   She has been very stable overnight. Her MAP is around 100 this am so she needs an antihypertensive started, probably Coreg. Will discuss with Dr. Gala Romney. She has some edema on CXR, PI is 7. Will increase speed to 9000. Chest tube output remains low. INR starting to rise. Continue coumadin.  Plan/Discussion:    Start Coreg if ok with Dr. Gala Romney.  Continue Milrinone, Revatio.  Diurese  Increase pump speed to 9000  DC mediastinal tube and continue pocket drain.  Out of bed, IS  I reviewed the LVAD parameters from today, and compared the results to the patient's prior recorded data.  No programming changes were made.  The LVAD is functioning within specified parameters.  The patient performs LVAD self-test daily.  LVAD interrogation was negative for any significant power changes, alarms or PI events/speed drops.  LVAD equipment check completed and is  in good working order.  Back-up equipment present.   LVAD education done on emergency procedures and precautions and reviewed exit site care.  Length of Stay: 3  Alleen Borne 04/08/2013, 4:29 PM  VAD Team Pager [phone] (7am - 7am)

## 2013-04-08 NOTE — Progress Notes (Signed)
Patient ID: Brianna Hanson, female   DOB: 1935-05-11, 78 y.o.   MRN: [id number]  SICU Evening Rounds:  She remains hemodynamically stable with MAP 83, CVP 8.   She developed wide complex tachycardia 160's this afternoon that was recurrent without hemodynamic compromise or symptoms while up in chair. It looked like VT. I gave her a 150 mg bolus of amio and turned speed down to 8800 from 9000 in case the cannula was stimulating the ventricle. She has not been having any further wide complex tachycardia since.  She has been diuresing well.  The drive line site was observed when nurses changed the dressing and it looks good. There is no drainage around it.

## 2013-04-08 NOTE — Progress Notes (Signed)
Occupational Therapy Evaluation Patient Details Name: Brianna Hanson MRN: [id number] DOB: July 18, 1935 Today's Date: 04/08/2013 Time: 4098-1191 OT Time Calculation (min): 39 min  OT Assessment / Plan / Recommendation History of present illness 78 year old female with heart failure due severe non-ischemic cardiomyopathy on milrinone and revatio scheduled for Heartmate II LVAD tomorrow for destination therapy. underwent HM II VAD implant 04/06/13    Clinical Impression   PTA, pt independent with ADL and mobility without AD. PT has 24/7 assistance available from boyfriend Brianna Hanson) and sister (Brianna Hanson). Excellent participation today. Began education on operation of the LVAD system. Completed bed - chair transfer, taking @ 5 steps to chair with +2 assist for line mgnt. Pt completing at least 60% of transfer. Pt very motivated to regain independence. Pt will benefit from skilled OT services to facilitate D/C to home with HHOT due to below deficits.    OT Assessment  Patient needs continued OT Services    Follow Up Recommendations  Home health OT    Barriers to Discharge      Equipment Recommendations  Other (comment);3 in 1 bedside comode Arts administrator)    Recommendations for Other Services    Frequency  Min 3X/week    Precautions / Restrictions Precautions Precautions: Sternal;Fall Precaution Comments: educated patient on sternotomy incision and precautions to ensure healing Restrictions Weight Bearing Restrictions:  (sternal precautions)   Pertinent Vitals/Pain Vitals stable    ADL  Grooming: Moderate assistance Where Assessed - Grooming: Unsupported sitting Upper Body Bathing: Moderate assistance Where Assessed - Upper Body Bathing: Unsupported sitting Lower Body Bathing: Maximal assistance Where Assessed - Lower Body Bathing: Supported sit to stand Upper Body Dressing: Maximal assistance Where Assessed - Upper Body Dressing: Unsupported sitting Lower Body Dressing: Maximal  assistance Where Assessed - Lower Body Dressing: Supported sit to stand Toilet Transfer: +2 Total assistance Toilet Transfer: Patient Percentage: 60% Toilet Transfer Method: Sit to stand;Stand pivot (simulated bed - chair) Transfers/Ambulation Related to ADLs: +2 total for lines. Pt @ 60% ADL Comments: functinal decline.Began education on sternal precautinos. Information about controlbox andline drive. Did not switch to battery today. explained selftest    OT Diagnosis: Generalized weakness;Acute pain  OT Problem List: Decreased strength;Decreased range of motion;Decreased activity tolerance;Decreased safety awareness;Decreased knowledge of use of DME or AE;Decreased knowledge of precautions;Cardiopulmonary status limiting activity;Pain OT Treatment Interventions: Self-care/ADL training;Therapeutic exercise;Energy conservation;DME and/or AE instruction;Therapeutic activities;Patient/family education   OT Goals(Current goals can be found in the care plan section) Acute Rehab OT Goals Patient Stated Goal: to go home OT Goal Formulation: With patient Time For Goal Achievement: 04/22/13 Potential to Achieve Goals: Good  Visit Information  Last OT Received On: 04/08/13 Assistance Needed: +2 PT/OT Co-Evaluation/Treatment: Yes Reason Eval/Treat Not Completed: Patient not medically ready History of Present Illness: 78 year old female with heart failure due severe non-ischemic cardiomyopathy on milrinone and revatio scheduled for Heartmate II LVAD tomorrow for destination therapy       Prior Functioning     Home Living Family/patient expects to be discharged to:: Private residence Living Arrangements: Spouse/significant other Available Help at Discharge: Available 24 hours/day Type of Home: House Home Access: Stairs to enter Secretary/administrator of Steps: 1 Home Layout: One level Home Equipment: Shower seat Prior Function Level of Independence:  Independent Communication Communication: No difficulties Dominant Hand: Right         Vision/Perception     Cognition  Cognition Arousal/Alertness: Awake/alert Behavior During Therapy: WFL for tasks assessed/performed Overall Cognitive Status: Within Functional Limits  for tasks assessed    Extremity/Trunk Assessment Upper Extremity Assessment Upper Extremity Assessment: Generalized weakness Lower Extremity Assessment Lower Extremity Assessment: Generalized weakness Cervical / Trunk Assessment Cervical / Trunk Assessment: Normal     Mobility Bed Mobility Bed Mobility: Rolling Left;Left Sidelying to Sit;Sitting - Scoot to Edge of Bed Rolling Left: 3: Mod assist Left Sidelying to Sit: 2: Max assist Sitting - Scoot to [place] of Bed: 3: Mod assist Transfers Transfers: Sit to Stand;Stand to Sit Sit to Stand: 1: +2 Total assist Sit to Stand: Patient Percentage: 60% Stand to Sit: 1: +2 Total assist;To chair/3-in-1 Stand to Sit: Patient Percentage: 60% Details for Transfer Assistance: Pt took @ 5 steps to chair with B arm support     Exercise Other Exercises Other Exercises: encouraged incentive spirometer   Balance Balance Balance Assessed: Yes Static Sitting Balance Static Sitting - Balance Support: No upper extremity supported Static Sitting - Level of Assistance: 5: Stand by assistance Static Sitting - Comment/# of Minutes: 7 Static Standing Balance Static Standing - Balance Support: Bilateral upper extremity supported Static Standing - Level of Assistance: 3: Mod assist   End of Session OT - End of Session Equipment Utilized During Treatment: Oxygen;Other (comment) (LVAD equipment) Activity Tolerance: Patient tolerated treatment well Patient left: in chair;with call bell/phone within reach;with nursing/sitter in room Nurse Communication: Mobility status;Precautions  GO     [facility] 04/08/2013, 11:25 AM Luisa Dago, OTR/L  [phone] 04/08/2013

## 2013-04-08 NOTE — Evaluation (Signed)
Physical Therapy Evaluation Patient Details Name: Brianna Hanson MRN: [id number] DOB: Nov 03, 1934 Today's Date: 04/08/2013 Time: 4098-1191 PT Time Calculation (min): 42 min  PT Assessment / Plan / Recommendation History of Present Illness  78 year old female with heart failure due severe non-ischemic cardiomyopathy on milrinone and revatio scheduled for Heartmate II LVAD tomorrow for destination therapy  Clinical Impression  Pt is a pleasant 78 y.o female with deficits in functional mobility as indicated below. Patient will benefit from continued skilled PT to address deficits and maximize function. Educated patient on importance of sternal precautions and LVAD equipment and precautions. Provided patient with understanding and expectations of therapy components during acute stay. Will work with patient to improve function and management of LVAD device and equipment to ensure safe mobility at discharge. Will see as indicated and progress activity as tolerated. Rec HHPT upon discharge.    PT Assessment  Patient needs continued PT services    Follow Up Recommendations  Home health PT;Supervision/Assistance - 24 hour          Equipment Recommendations  Other (comment) (rollator walker)       Frequency Min 5X/week    Precautions / Restrictions Precautions Precautions: Sternal;Fall Precaution Comments: educated patient on sternotomy incision and precautions to ensure healing Restrictions Weight Bearing Restrictions:  (sternal precautions)   Pertinent Vitals/Pain VSS, no pain at this time, pr does report some nausea today      Mobility  Bed Mobility Bed Mobility: Rolling Left;Left Sidelying to Sit;Sitting - Scoot to Edge of Bed Rolling Left: 3: Mod assist Left Sidelying to Sit: 2: Max assist Sitting - Scoot to [place] of Bed: 3: Mod assist Transfers Transfers: Sit to Stand;Stand to Sit;Stand Pivot Transfers Sit to Stand: 1: +2 Total assist Sit to Stand: Patient Percentage: 60% Stand  to Sit: 1: +2 Total assist;To chair/3-in-1 Stand to Sit: Patient Percentage: 60% Stand Pivot Transfers: 1: +2 Total assist Stand Pivot Transfers: Patient Percentage: 60% Details for Transfer Assistance: Pt took @ 5 steps to chair with B arm support Ambulation/Gait Ambulation/Gait Assistance: Not tested (comment)    Exercises Other Exercises Other Exercises: encouraged incentive spirometer Other Exercises: encourage ankle pumps and LE ROM   PT Diagnosis: Difficulty walking;Generalized weakness;Acute pain  PT Problem List: Decreased strength;Decreased activity tolerance;Decreased balance;Decreased mobility;Decreased knowledge of precautions;Cardiopulmonary status limiting activity PT Treatment Interventions: DME instruction;Gait training;Stair training;Functional mobility training;Therapeutic activities;Therapeutic exercise;Balance training;Patient/family education     PT Goals(Current goals can be found in the care plan section) Acute Rehab PT Goals Patient Stated Goal: to go home PT Goal Formulation: With patient Time For Goal Achievement: 04/22/13 Potential to Achieve Goals: Good  Visit Information  Last PT Received On: 04/08/13 Assistance Needed: +2 History of Present Illness: 78 year old female with heart failure due severe non-ischemic cardiomyopathy on milrinone and revatio scheduled for Heartmate II LVAD tomorrow for destination therapy       Prior Functioning  Home Living Family/patient expects to be discharged to:: Private residence Living Arrangements: Spouse/significant other Available Help at Discharge: Available 24 hours/day Type of Home: House Home Access: Stairs to enter Secretary/administrator of Steps: 1 Home Layout: One level Home Equipment: Shower seat Prior Function Level of Independence: Independent Communication Communication: No difficulties Dominant Hand: Right    Cognition  Cognition Arousal/Alertness: Awake/alert Behavior During Therapy: WFL  for tasks assessed/performed Overall Cognitive Status: Within Functional Limits for tasks assessed    Extremity/Trunk Assessment Upper Extremity Assessment Upper Extremity Assessment: Generalized weakness Lower Extremity Assessment Lower  Extremity Assessment: Generalized weakness Cervical / Trunk Assessment Cervical / Trunk Assessment: Normal   Balance Balance Balance Assessed: Yes Static Sitting Balance Static Sitting - Balance Support: Feet supported Static Sitting - Level of Assistance: 5: Stand by assistance Static Sitting - Comment/# of Minutes: 8 minutes Static Standing Balance Static Standing - Balance Support: Bilateral upper extremity supported Static Standing - Level of Assistance: 3: Mod assist  End of Session PT - End of Session Equipment Utilized During Treatment: Oxygen Activity Tolerance: Patient tolerated treatment well;Patient limited by fatigue Patient left: in chair;with call bell/phone within reach;with nursing/sitter in room Nurse Communication: Mobility status  GP     Fabio Asa 04/08/2013, 11:26 AM Charlotte Crumb, PT DPT  [phone]

## 2013-04-08 NOTE — Progress Notes (Signed)
MD Bartle to pt bedside. Pt monitor reading V-tach rate 140-170s; 1st episode lasting 50 seconds, 2nd episode sustaining. BP Map 100, No changes in LOC. 150mg  Amiodarone bolus ordered. Pt now back in SR HR 91. Will cont to monitor pt. Shenee Wignall L

## 2013-04-09 ENCOUNTER — Inpatient Hospital Stay (HOSPITAL_COMMUNITY): Payer: Medicare Other

## 2013-04-09 DIAGNOSIS — Z95818 Presence of other cardiac implants and grafts: Secondary | ICD-10-CM

## 2013-04-09 DIAGNOSIS — I5023 Acute on chronic systolic (congestive) heart failure: Secondary | ICD-10-CM

## 2013-04-09 LAB — TYPE AND SCREEN
ABO/RH(D): O POS
Antibody Screen: NEGATIVE
Unit division: 0
Unit division: 0
Unit division: 0
Unit division: 0
Unit division: 0
Unit division: 0

## 2013-04-09 LAB — CBC WITH DIFFERENTIAL/PLATELET
Basophils Absolute: 0 10*3/uL (ref 0.0–0.1)
Basophils Relative: 0 % (ref 0–1)
Eosinophils Absolute: 0.4 10*3/uL (ref 0.0–0.7)
Eosinophils Relative: 3 % (ref 0–5)
HCT: 28.5 % — ABNORMAL LOW (ref 36.0–46.0)
Hemoglobin: 9.6 g/dL — ABNORMAL LOW (ref 12.0–15.0)
Lymphocytes Relative: 7 % — ABNORMAL LOW (ref 12–46)
Lymphs Abs: 1 10*3/uL (ref 0.7–4.0)
MCH: 31.8 pg (ref 26.0–34.0)
MCHC: 33.7 g/dL (ref 30.0–36.0)
MCV: 94.4 fL (ref 78.0–100.0)
Monocytes Absolute: 1.5 10*3/uL — ABNORMAL HIGH (ref 0.1–1.0)
Monocytes Relative: 11 % (ref 3–12)
Neutro Abs: 10.8 10*3/uL — ABNORMAL HIGH (ref 1.7–7.7)
Neutrophils Relative %: 79 % — ABNORMAL HIGH (ref 43–77)
Platelets: 125 10*3/uL — ABNORMAL LOW (ref 150–400)
RBC: 3.02 MIL/uL — ABNORMAL LOW (ref 3.87–5.11)
RDW: 17.7 % — ABNORMAL HIGH (ref 11.5–15.5)
WBC: 13.6 10*3/uL — ABNORMAL HIGH (ref 4.0–10.5)

## 2013-04-09 LAB — PROTIME-INR
INR: 1.44 (ref 0.00–1.49)
Prothrombin Time: 17.2 seconds — ABNORMAL HIGH (ref 11.6–15.2)

## 2013-04-09 LAB — GLUCOSE, CAPILLARY
Glucose-Capillary: 88 mg/dL (ref 70–99)
Glucose-Capillary: 86 mg/dL (ref 70–99)
Glucose-Capillary: 98 mg/dL (ref 70–99)
Glucose-Capillary: 94 mg/dL (ref 70–99)
Glucose-Capillary: 134 mg/dL — ABNORMAL HIGH (ref 70–99)

## 2013-04-09 LAB — CARBOXYHEMOGLOBIN
Carboxyhemoglobin: 1.8 % — ABNORMAL HIGH (ref 0.5–1.5)
Methemoglobin: 0.7 % (ref 0.0–1.5)
O2 Saturation: 73.4 %
Total hemoglobin: 9.2 g/dL — ABNORMAL LOW (ref 12.0–16.0)

## 2013-04-09 LAB — COMPREHENSIVE METABOLIC PANEL
ALT: 21 U/L (ref 0–35)
AST: 80 U/L — ABNORMAL HIGH (ref 0–37)
Albumin: 2.7 g/dL — ABNORMAL LOW (ref 3.5–5.2)
Alkaline Phosphatase: 70 U/L (ref 39–117)
BUN: 10 mg/dL (ref 6–23)
CO2: 29 mEq/L (ref 19–32)
Calcium: 8.9 mg/dL (ref 8.4–10.5)
Chloride: 105 mEq/L (ref 96–112)
Creatinine, Ser: 0.91 mg/dL (ref 0.50–1.10)
GFR calc Af Amer: 69 mL/min — ABNORMAL LOW (ref >90)
GFR calc non Af Amer: 60 mL/min — ABNORMAL LOW (ref >90)
Glucose, Bld: 96 mg/dL (ref 70–99)
Potassium: 3.9 mEq/L (ref 3.5–5.1)
Sodium: 140 mEq/L (ref 135–145)
Total Bilirubin: 0.8 mg/dL (ref 0.3–1.2)
Total Protein: 5.8 g/dL — ABNORMAL LOW (ref 6.0–8.3)

## 2013-04-09 LAB — PHOSPHORUS: Phosphorus: 3 mg/dL (ref 2.3–4.6)

## 2013-04-09 LAB — MAGNESIUM: Magnesium: 2.1 mg/dL (ref 1.5–2.5)

## 2013-04-09 LAB — LACTATE DEHYDROGENASE: LDH: 550 U/L — ABNORMAL HIGH (ref 94–250)

## 2013-04-09 MED ORDER — AMIODARONE IV BOLUS ONLY 150 MG/100ML
150.0000 mg | Freq: Once | INTRAVENOUS | Status: AC
  Administered 2013-04-09: 150 mg via INTRAVENOUS
  Filled 2013-04-09 (×2): qty 100

## 2013-04-09 MED ORDER — AMIODARONE HCL IN DEXTROSE 360-4.14 MG/200ML-% IV SOLN
60.0000 mg/h | INTRAVENOUS | Status: AC
  Administered 2013-04-09: 60 mg/h via INTRAVENOUS
  Filled 2013-04-09: qty 200

## 2013-04-09 MED ORDER — DIGOXIN 0.25 MG/ML IJ SOLN
0.2500 mg | Freq: Every day | INTRAMUSCULAR | Status: DC
  Administered 2013-04-09: 0.25 mg via INTRAVENOUS
  Filled 2013-04-09 (×2): qty 1

## 2013-04-09 MED ORDER — ENSURE COMPLETE PO LIQD
237.0000 mL | Freq: Two times a day (BID) | ORAL | Status: DC
  Administered 2013-04-10 – 2013-04-22 (×18): 237 mL via ORAL

## 2013-04-09 MED ORDER — WARFARIN SODIUM 5 MG PO TABS
5.0000 mg | ORAL_TABLET | Freq: Once | ORAL | Status: AC
  Administered 2013-04-09: 5 mg via ORAL
  Filled 2013-04-09: qty 1

## 2013-04-09 MED ORDER — AMIODARONE HCL IN DEXTROSE 360-4.14 MG/200ML-% IV SOLN
30.0000 mg/h | INTRAVENOUS | Status: DC
  Administered 2013-04-09 – 2013-04-10 (×2): 30 mg/h via INTRAVENOUS
  Filled 2013-04-09 (×5): qty 200

## 2013-04-09 NOTE — Progress Notes (Signed)
Patient ID: Brianna Hanson, female   DOB: 22-Dec-1934, 78 y.o.   MRN: [id number] HeartMate 2 Rounding Note  Subjective:    No complaints. Slept fairly well. Not passing flatus yet. No BM. No nausea. Has been sipping apple juice.   LVAD INTERROGATION:  HeartMate II LVAD:  Flow --- liters/min, speed 8800, power 4.2, PI 7.8.    Objective:    Vital Signs:   Temp:  [98.2 F (36.8 C)-100 F (37.8 C)] 98.6 F (37 C) (08/08 0742) Pulse Rate:  [48-102] 93 (08/08 0808) Resp:  [15-30] 26 (08/08 0700) SpO2:  [91 %-100 %] 95 % (08/08 0700) Arterial Line BP: (103)/(72) 103/72 mmHg (08/07 0900) Weight:  [76.3 kg (168 lb 3.4 oz)] 76.3 kg (168 lb 3.4 oz) (08/08 0600) Last BM Date: 04/06/13 Mean arterial Pressure 88 CVP 3-4 sitting up  Intake/Output:   Intake/Output Summary (Last 24 hours) at 04/09/13 0813 Last data filed at 04/09/13 0600  Gross per 24 hour  Intake   1361 ml  Output   3910 ml  Net  -2549 ml     Physical Exam: General:  Well appearing. No resp difficulty HEENT: normal Neck: supple.  Cor:  LVAD hum present. I don't hear valve sounds Lungs: decreased in bases Abdomen: soft, nontender, mildly distended. No hepatosplenomegaly. No bruits or masses. Good bowel sounds. Driveline: C/D/I; securement device intact and driveline incorporated Extremities: no cyanosis, moderate ankle edema Neuro: alert & orientedx3, cranial nerves grossly intact. moves all 4 extremities w/o difficulty. Affect pleasant  Telemetry: sinus 90  Labs: Basic Metabolic Panel:  Recent Labs Lab 04/06/13 0523  04/06/13 1455  04/06/13 2052 04/06/13 2112 04/07/13 0408 04/07/13 0500 04/07/13 1628 04/07/13 1630 04/08/13 0400 04/08/13 1400 04/09/13 0425  NA 140  < > 145  < >  --  148* 145  --  146*  --  141  --  140  K 4.0  < > 2.5*  < >  --  3.3* 4.2  --  3.8  --  3.9 4.1 3.9  CL 107  --  112  < >  --  112 115*  --  111  --  110  --  105  CO2 21  --  22  --   --   --  23  --   --   --  23  --  29   GLUCOSE 90  < > 118*  < >  --  144* 104*  --  91  --  87  --  96  BUN 11  --  9  < >  --  7 9  --  6  --  8  --  10  CREATININE 1.10  --  0.90  < > 0.98 1.10 0.91  --  0.90 0.86 0.85  --  0.91  CALCIUM 9.5  --  8.4  --   --   --  8.7  --   --   --  8.9  --  8.9  MG  --   --  1.7  --  3.0*  --   --  2.7*  --  2.3 2.3  --  2.1  PHOS  --   --   --   --   --   --   --  3.2  --   --  3.4  --  3.0  < > = values in this interval not displayed.  Liver Function Tests:  Recent Labs Lab 04/05/13  1220 04/07/13 0408 04/07/13 0500 04/09/13 0425  AST 17 107*  --  80*  ALT 11 18  --  21  ALKPHOS 90  --   --  70  BILITOT 0.8  --  1.9* 0.8  PROT 7.3  --   --  5.8*  ALBUMIN 3.1*  --   --  2.7*   No results found for this basename: LIPASE, AMYLASE,  in the last 168 hours No results found for this basename: AMMONIA,  in the last 168 hours  CBC:  Recent Labs Lab 04/05/13 1220  04/06/13 2052  04/07/13 0408 04/07/13 1628 04/07/13 1630 04/08/13 0400 04/09/13 0425  WBC 4.6  < > 11.6*  --  10.9*  --  10.6* 12.5* 13.6*  NEUTROABS 2.5  --   --   --  9.1*  --   --  9.9* 10.8*  HGB 12.4  < > 8.7*  < > 9.2* 9.5* 9.5* 9.5* 9.6*  HCT 36.6  < > 25.8*  < > 27.6* 28.0* 28.2* 28.3* 28.5*  MCV 92.7  < > 93.1  --  92.6  --  93.4 93.7 94.4  PLT 188  < > 148*  --  125*  --  111* 108* 125*  < > = values in this interval not displayed.  INR:  Recent Labs Lab 04/05/13 1346 04/06/13 1455 04/07/13 0408 04/08/13 0400 04/09/13 0425  INR 1.14 1.59* 1.39 1.51* 1.44   LDH 392 <547 <550 [place] 73%  Pulse ox sat 96%  Other results:  EKG:   Imaging: Dg Chest Port 1 View  04/09/2013   *RADIOLOGY REPORT*  Clinical Data: Post LVAD placement  PORTABLE CHEST - 1 VIEW  Comparison: 04/08/2013; 04/07/2013; 04/06/2013  Findings:  Grossly unchanged enlarged cardiac silhouette and mediastinal contours.  Interval removal of midline mediastinal drain. Otherwise, stable positioning of support apparatus.  No  pneumothorax.  Pulmonary vasculature remains indistinct with cephalization of flow.  Grossly unchanged small layering bilateral effusions associated bibasilar heterogeneous / consolidative opacities, left greater than right.  No new focal airspace opacities.  Unchanged bones.  IMPRESSION: 1.  Interval removal of midline mediastinal drain.  Otherwise, stable positioning of remaining support apparatus.  No pneumothorax. 2.  Persistent findings of pulmonary edema, small bilateral effusions and bibasilar opacities, likely atelectasis.   Original Report Authenticated By: Tacey Ruiz, MD   Dg Chest Port 1 View  04/08/2013   *RADIOLOGY REPORT*  Clinical Data: Post left ventricular assist device.  PORTABLE CHEST - 1 VIEW  Comparison: 04/07/2013.  Findings: Portion of left ventricular assist device is noted. Mediastinal drain remains in place.  Chest tubes have been removed. No gross pneumothorax.  Swan-Ganz catheter removed.  Introducer at the level of the proximal superior vena cava.  Left central line remains in place with the tip directed laterally at the level of the proximal superior vena cava.  Cardiomegaly.  Pulmonary edema.  Small bilateral pleural effusions. Basilar consolidation may represent atelectasis and pleural effusions although limiting detection of underlying infiltrate or mass.  Catheter overlies the right upper abdomen.  Etiology indeterminate.  IMPRESSION: Chest tubes, nasogastric tube and Swan-Ganz catheter have been removed.  No gross pneumothorax.  Pulmonary edema and pleural effusions.  Cardiomegaly with left ventricular assist device in place.  Left central line remains in place with the tip directed laterally at the level of the proximal superior vena cava.   Original Report Authenticated By: Lacy Duverney, M.D.      Medications:  Scheduled Medications: . amiodarone  200 mg Oral Daily  . aspirin EC  325 mg Oral Daily   Or  . aspirin  324 mg Per Tube Daily   Or  . aspirin  300 mg  Rectal Daily  . bisacodyl  10 mg Oral Daily   Or  . bisacodyl  10 mg Rectal Daily  . carvedilol  3.125 mg Oral BID WC  . cholecalciferol  1,000 Units Oral Daily  . docusate sodium  200 mg Oral Daily  . insulin aspart  0-24 Units Subcutaneous Q4H  . losartan  50 mg Oral Daily  . pantoprazole  40 mg Oral Daily  . rifampin  600 mg Oral Once  . sildenafil  60 mg Oral TID  . sodium chloride  3 mL Intravenous Q12H  . sodium chloride  3 mL Intravenous Q12H  . warfarin  5 mg Oral ONCE-1800  . Warfarin - Physician Dosing Inpatient   Does not apply q1800     Infusions: . sodium chloride 20 mL/hr at 04/09/13 0700  . sodium chloride 20 mL/hr at 04/07/13 0700  . sodium chloride    . lactated ringers 12 mL/hr at 04/09/13 0700  . milrinone 0.375 mcg/kg/min (04/09/13 0700)     PRN Medications:  sodium chloride, acetaminophen, ALPRAZolam, morphine injection, ondansetron (ZOFRAN) IV, sodium chloride, sodium chloride, traMADol   Assessment:  She is doing well.   Plan/Discussion:    CVP is low so we will hold on further diuresis today. She was having recurrent episodes of VT yesterday that may have been due to cannula stimulating ventricle. Had 1 PI event overnight.   Milrinone decreased to 0.25 today.  INR down slightly. Will give 5 mg coumadin today.  DC sleeve and foley.  Pocket drain put out 110 serosanguinous for last shift so will leave it in today.  Continue IS and mobilization.   I reviewed the LVAD parameters from today, and compared the results to the patient's prior recorded data.  No programming changes were made.  The LVAD is functioning within specified parameters.  The patient performs LVAD self-test daily.  LVAD interrogation was negative for any significant power changes, alarms or PI events/speed drops.  LVAD equipment check completed and is in good working order.  Back-up equipment present.   LVAD education done on emergency procedures and precautions and reviewed  exit site care.  Length of Stay: 4  Alleen Borne 04/09/2013, 8:13 AM  VAD Team Pager [phone] (7am - 7am)

## 2013-04-09 NOTE — Plan of Care (Signed)
Problem: Phase II Progression Outcomes Goal: Tolerates weaning with O2 Sat > 90 Outcome: Progressing O2 decreased to 2 L/min [place]. IS 1500. Goal: Tolerates liquids without nausea/vomiting Outcome: Progressing Advanced to full liquid diet and nutrition is adding supplements.  Problem: Phase III Progression Outcomes Goal: Dysrhythmias controlled Outcome: Progressing On amiodarone drip after bolus given this am to help control heart rate in atrial fibrillation. Milrinone decreased to 0.25 mcg/kg. NO diuretics today. MAP 70-80. Goal: Ambulates with pain/dyspnea controlled Outcome: Not Progressing Patient has been in atrial fibrillation today with heart rates 110-140 on amiodarone. Has been up to chair x 2 and PT has worked with PT at bedside and chair.

## 2013-04-09 NOTE — Progress Notes (Signed)
Physical Therapy Treatment Patient Details Name: Brianna Hanson MRN: [id number] DOB: [birth year] Today's Date: 04/09/2013 Time: 4098-1191 PT Time Calculation (min): 24 min  PT Assessment / Plan / Recommendation  History of Present Illness 78 year old female with heart failure due severe non-ischemic cardiomyopathy on milrinone and revatio scheduled for Heartmate II LVAD tomorrow for destination therapy   PT Comments   Patient seen for mobility and OOB. Patient able to perform transfers with less assist today and began initiating ambulation in room with hand held assist. Increased ambulation limited secondary to increased HR. Will continue to see and progress activity and education as tolerated.   Follow Up Recommendations  Home health PT;Supervision/Assistance - 24 hour           Equipment Recommendations  Other (comment) (rollator walker)       Frequency Min 5X/week   Progress towards PT Goals Progress towards PT goals: Progressing toward goals  Plan Current plan remains appropriate    Precautions / Restrictions Precautions Precautions: Sternal;Fall Precaution Comments: educated patient on sternotomy incision and precautions to ensure healing   Pertinent Vitals/Pain Patient reports 4/10 headache today    Mobility  Bed Mobility Bed Mobility: Supine to Sit;Sitting - Scoot to Edge of Bed Supine to Sit: 3: Mod assist Sitting - Scoot to Edge of Bed: 3: Mod assist Details for Bed Mobility Assistance: Assist to elevate trunk and rotate to EOB Transfers Transfers: Sit to Stand;Stand to Sit Sit to Stand: 4: Min assist Stand to Sit: 4: Min assist Details for Transfer Assistance: Patient with good stability upon standing, minial assist for elevation and positioning Ambulation/Gait Ambulation/Gait Assistance: 3: Mod assist Ambulation Distance (Feet): 16 Feet Assistive device: 1 person hand held assist Gait Pattern: Step-to pattern Gait velocity: decreased    Exercises Other  Exercises Other Exercises: LVAD conversion to power source x 2     PT Goals (current goals can now be found in the care plan section) Acute Rehab PT Goals Patient Stated Goal: to go home PT Goal Formulation: With patient Time For Goal Achievement: 04/22/13 Potential to Achieve Goals: Good  Visit Information  Last PT Received On: 04/09/13 Assistance Needed: +2 History of Present Illness: 78 year old female with heart failure due severe non-ischemic cardiomyopathy on milrinone and revatio scheduled for Heartmate II LVAD tomorrow for destination therapy    Subjective Data  Subjective: I slept really well Patient Stated Goal: to go home   Cognition  Cognition Arousal/Alertness: Awake/alert Behavior During Therapy: WFL for tasks assessed/performed Overall Cognitive Status: Within Functional Limits for tasks assessed    Balance  Static Sitting Balance Static Sitting - Balance Support: Feet supported Static Sitting - Level of Assistance: 5: Stand by assistance Static Sitting - Comment/# of Minutes: 4 minutes Static Standing Balance Static Standing - Balance Support: No upper extremity supported Static Standing - Level of Assistance: 3: Mod assist  End of Session PT - End of Session Equipment Utilized During Treatment: Oxygen Activity Tolerance: Patient tolerated treatment well;Patient limited by fatigue Patient left: in chair;with call bell/phone within reach Nurse Communication: Mobility status   GP     Fabio Asa 04/09/2013, 4:05 PM Charlotte Crumb, PT DPT  [phone]

## 2013-04-09 NOTE — Progress Notes (Signed)
Physical Therapy Treatment Patient Details Name: Brianna Hanson MRN: [id number] DOB: July 03, 1935 Today's Date: 04/09/2013 Time: 4098-1191 PT Time Calculation (min): 28 min  PT Assessment / Plan / Recommendation  History of Present Illness 78 year old female with heart failure due severe non-ischemic cardiomyopathy on milrinone and revatio scheduled for Heartmate II LVAD tomorrow for destination therapy   PT Comments   Me with patient for LVAD education pre-mobility. Patient educated on LVAD equipment and expectations for mobility. Patient performed conversion to battery source with mod assist and multi modal cues. Patient also able to recall sternal precautions from evaluation. Will work with patient on mobility and ambulation this pm.   Follow Up Recommendations  Home health PT;Supervision/Assistance - 24 hour           Equipment Recommendations  Other (comment) (rollator walker)       Frequency Min 5X/week   Progress towards PT Goals Progress towards PT goals: Progressing toward goals  Plan Current plan remains appropriate    Precautions / Restrictions Precautions Precautions: Sternal;Fall Precaution Comments: educated patient on sternotomy incision and precautions to ensure healing   Pertinent Vitals/Pain Patient reports no pain at this time    Mobility    To be performed this pm   Exercises Other Exercises Other Exercises: LVAD conversion to power source x 2     PT Goals (current goals can now be found in the care plan section) Acute Rehab PT Goals Patient Stated Goal: to go home PT Goal Formulation: With patient Time For Goal Achievement: 04/22/13 Potential to Achieve Goals: Good  Visit Information  Last PT Received On: 04/09/13 History of Present Illness: 78 year old female with heart failure due severe non-ischemic cardiomyopathy on milrinone and revatio scheduled for Heartmate II LVAD tomorrow for destination therapy    Subjective Data  Subjective: I slept  really well Patient Stated Goal: to go home   Cognition  Cognition Arousal/Alertness: Awake/alert Behavior During Therapy: WFL for tasks assessed/performed Overall Cognitive Status: Within Functional Limits for tasks assessed       End of Session PT - End of Session Equipment Utilized During Treatment: Oxygen Activity Tolerance: Patient tolerated treatment well;Patient limited by fatigue Patient left: in bed;with call bell/phone within reach Nurse Communication: Mobility status   GP     Fabio Asa 04/09/2013, 1:37 PM Charlotte Crumb, PT DPT  [phone]

## 2013-04-09 NOTE — Progress Notes (Signed)
NUTRITION FOLLOW UP/CONSULT  Intervention:   Add Ensure Complete po BID, each supplement provides 350 kcal and 13 grams of protein. RD to continue to follow nutrition care plan.  Nutrition Dx:   Inadequate oral intake now r/t need for slow diet advancement AEB diet progression.  New Goal:   Intake to meet >90% of estimated nutrition needs.  Monitor:   weight trends, lab trends, I/O's, PO intake, supplement tolerance  Assessment:   Admitted for LVAD implantation. Pt with left ventricular EF of 15% 2/2 severe non-ischemic cardiomyopathy.   Underwent the following procedures 8/5:  INSERTION OF IMPLANTABLE LEFT VENTRICULAR ASSIST DEVICE  PLACEMENT OF CENTRIMAG VENTRICULAR ASSIST DEVICE  INTRAOPERATIVE TRANSESOPHAGEAL ECHOCARDIOGRAM   Extubated 8/6. Advanced to sips and chips. Per RN, pt has been receiving clear liquids and will be advanced to full liquids at the next meal. RD consulted for poor oral intake. Note that clear liquid diet is very limited in calorie and protein provision. Pt is currently resting.  Height: Ht Readings from Last 1 Encounters:  04/05/13 4' 11.84" (1.52 m)    Weight Status:   Wt Readings from Last 1 Encounters:  04/09/13 168 lb 3.4 oz (76.3 kg)  Admit wt 170 lb - stable.   Re-estimated needs:  Kcal: 1700 - 1900 Protein: 70 - 85 g Fluid: 1.7 - 1.9 liters daily  Skin:  Chest incision  Diet Order: Advance diet as tolerated (per RN) currently on clear liquids and to be advanced to full liquids   Intake/Output Summary (Last 24 hours) at 04/09/13 1600 Last data filed at 04/09/13 1445  Gross per 24 hour  Intake 1533.14 ml  Output   2365 ml  Net -831.86 ml    Last BM: 8/5   Labs:   Recent Labs Lab 04/07/13 0408 04/07/13 0500 04/07/13 1628 04/07/13 1630 04/08/13 0400 04/08/13 1400 04/09/13 0425  NA 145  --  146*  --  141  --  140  K 4.2  --  3.8  --  3.9 4.1 3.9  CL 115*  --  111  --  110  --  105  CO2 23  --   --   --  23  --  29   BUN 9  --  6  --  8  --  10  CREATININE 0.91  --  0.90 0.86 0.85  --  0.91  CALCIUM 8.7  --   --   --  8.9  --  8.9  MG  --  2.7*  --  2.3 2.3  --  2.1  PHOS  --  3.2  --   --  3.4  --  3.0  GLUCOSE 104*  --  91  --  87  --  96    CBG (last 3)   Recent Labs  04/09/13 0343 04/09/13 0739 04/09/13 1151  GLUCAP 88 86 98    Scheduled Meds: . amiodarone  200 mg Oral Daily  . aspirin EC  325 mg Oral Daily   Or  . aspirin  324 mg Per Tube Daily   Or  . aspirin  300 mg Rectal Daily  . bisacodyl  10 mg Oral Daily   Or  . bisacodyl  10 mg Rectal Daily  . carvedilol  3.125 mg Oral BID WC  . cholecalciferol  1,000 Units Oral Daily  . docusate sodium  200 mg Oral Daily  . insulin aspart  0-24 Units Subcutaneous Q4H  . losartan  50 mg Oral  Daily  . pantoprazole  40 mg Oral Daily  . rifampin  600 mg Oral Once  . sildenafil  60 mg Oral TID  . sodium chloride  3 mL Intravenous Q12H  . sodium chloride  3 mL Intravenous Q12H  . warfarin  5 mg Oral ONCE-1800  . Warfarin - Physician Dosing Inpatient   Does not apply q1800    Continuous Infusions: . sodium chloride 20 mL/hr at 04/09/13 1446  . sodium chloride 20 mL/hr at 04/07/13 0700  . sodium chloride    . amiodarone (NEXTERONE PREMIX) 360 mg/200 mL dextrose 60 mg/hr (04/09/13 1445)  . amiodarone (NEXTERONE PREMIX) 360 mg/200 mL dextrose    . lactated ringers 15 mL/hr at 04/09/13 1000  . milrinone 0.25 mcg/kg/min (04/09/13 1027)    Brianna Motto MS, RD, LDN Pager: [phone] After-hours pager: [phone]

## 2013-04-09 NOTE — Progress Notes (Signed)
Patient ID: Brianna Hanson, female   DOB: 05-13-1935, 78 y.o.   MRN: [id number] HeartMate 2 Rounding Note  Subjective:    78 y/o female with severe CHF due to NICM, PAH, chronic AF underwent HM II VAD implant 04/06/13  Had some VT after speed turned up. Amio started and speed turned back to 8800. Ectopy resolved.  Sitting in chair feels good. Good diuresis. Now on po torsemide.  No alarms or PI events. Good urine output but weight stable. Not passing gas or BM yet.   Co-ox 73 CVP 4-5 range MAP about 85  LVAD INTERROGATION:  HeartMate II LVAD:  Flow -- liters/min, speed 8800, power 4.5, PI 7.3   Occasional PI events.   Objective:    Vital Signs:   Temp:  [98.2 F (36.8 C)-100 F (37.8 C)] 98.3 F (36.8 C) (08/08 0358) Pulse Rate:  [48-102] 92 (08/08 0700) Resp:  [15-30] 26 (08/08 0700) SpO2:  [91 %-100 %] 95 % (08/08 0700) Arterial Line BP: (103-116)/(70-72) 103/72 mmHg (08/07 0900) Weight:  [76.3 kg (168 lb 3.4 oz)] 76.3 kg (168 lb 3.4 oz) (08/08 0600) Last BM Date: 04/06/13 Mean arterial Pressure 80s   Intake/Output:   Intake/Output Summary (Last 24 hours) at 04/09/13 0732 Last data filed at 04/09/13 0600  Gross per 24 hour  Intake   1459 ml  Output   4060 ml  Net  -2601 ml     Physical Exam: General: Awake comfortable HEENT: Normal Neck: supple. JVP 4-6 . Carotids 2+ bilat; no bruits. No lymphadenopathy or thryomegaly appreciated. Cor: Mechanical heart sounds with LVAD hum present. Dressing c/d/i Lungs: clear Abdomen: soft, nontender, + distended. Quiet Driveline: C/D/I; securement device intact and driveline incorporated Extremities: no cyanosis, clubbing, rash, Tr edema  Neuro: awake alert. Pleasant. nonfocal  Telemetry: sinus 90   Labs: Basic Metabolic Panel:  Recent Labs Lab 04/06/13 0523  04/06/13 1455  04/06/13 2052 04/06/13 2112 04/07/13 0408 04/07/13 0500 04/07/13 1628 04/07/13 1630 04/08/13 0400 04/08/13 1400 04/09/13 0425  NA 140  < >  145  < >  --  148* 145  --  146*  --  141  --  140  K 4.0  < > 2.5*  < >  --  3.3* 4.2  --  3.8  --  3.9 4.1 3.9  CL 107  --  112  < >  --  112 115*  --  111  --  110  --  105  CO2 21  --  22  --   --   --  23  --   --   --  23  --  29  GLUCOSE 90  < > 118*  < >  --  144* 104*  --  91  --  87  --  96  BUN 11  --  9  < >  --  7 9  --  6  --  8  --  10  CREATININE 1.10  --  0.90  < > 0.98 1.10 0.91  --  0.90 0.86 0.85  --  0.91  CALCIUM 9.5  --  8.4  --   --   --  8.7  --   --   --  8.9  --  8.9  MG  --   --  1.7  --  3.0*  --   --  2.7*  --  2.3 2.3  --  2.1  PHOS  --   --   --   --   --   --   --  3.2  --   --  3.4  --  3.0  < > = values in this interval not displayed.  Liver Function Tests:  Recent Labs Lab 04/05/13 1220 04/07/13 0408 04/07/13 0500 04/09/13 0425  AST 17 107*  --  80*  ALT 11 18  --  21  ALKPHOS 90  --   --  70  BILITOT 0.8  --  1.9* 0.8  PROT 7.3  --   --  5.8*  ALBUMIN 3.1*  --   --  2.7*   No results found for this basename: LIPASE, AMYLASE,  in the last 168 hours No results found for this basename: AMMONIA,  in the last 168 hours  CBC:  Recent Labs Lab 04/05/13 1220  04/06/13 2052  04/07/13 0408 04/07/13 1628 04/07/13 1630 04/08/13 0400 04/09/13 0425  WBC 4.6  < > 11.6*  --  10.9*  --  10.6* 12.5* 13.6*  NEUTROABS 2.5  --   --   --  9.1*  --   --  9.9* 10.8*  HGB 12.4  < > 8.7*  < > 9.2* 9.5* 9.5* 9.5* 9.6*  HCT 36.6  < > 25.8*  < > 27.6* 28.0* 28.2* 28.3* 28.5*  MCV 92.7  < > 93.1  --  92.6  --  93.4 93.7 94.4  PLT 188  < > 148*  --  125*  --  111* 108* 125*  < > = values in this interval not displayed.  INR: 1.39  Recent Labs Lab 04/05/13 1346 04/06/13 1455 04/07/13 0408 04/08/13 0400 04/09/13 0425  INR 1.14 1.59* 1.39 1.51* 1.44   LDH 392  Coox 79.7% Arterial sat 99%  Imaging: Dg Chest Port 1 View  04/08/2013   *RADIOLOGY REPORT*  Clinical Data: Post left ventricular assist device.  PORTABLE CHEST - 1 VIEW  Comparison:  04/07/2013.  Findings: Portion of left ventricular assist device is noted. Mediastinal drain remains in place.  Chest tubes have been removed. No gross pneumothorax.  Swan-Ganz catheter removed.  Introducer at the level of the proximal superior vena cava.  Left central line remains in place with the tip directed laterally at the level of the proximal superior vena cava.  Cardiomegaly.  Pulmonary edema.  Small bilateral pleural effusions. Basilar consolidation may represent atelectasis and pleural effusions although limiting detection of underlying infiltrate or mass.  Catheter overlies the right upper abdomen.  Etiology indeterminate.  IMPRESSION: Chest tubes, nasogastric tube and Swan-Ganz catheter have been removed.  No gross pneumothorax.  Pulmonary edema and pleural effusions.  Cardiomegaly with left ventricular assist device in place.  Left central line remains in place with the tip directed laterally at the level of the proximal superior vena cava.   Original Report Authenticated By: Lacy Duverney, M.D.    CXR this am with bibasilar atelectasis and mild interstitial edema  Medications:     Scheduled Medications: . acetaminophen  1,000 mg Oral Q6H   Or  . acetaminophen (TYLENOL) oral liquid 160 mg/5 mL  1,000 mg Per Tube Q6H  . amiodarone  200 mg Oral Daily  . aspirin EC  325 mg Oral Daily   Or  . aspirin  324 mg Per Tube Daily   Or  . aspirin  300 mg Rectal Daily  . bisacodyl  10 mg Oral Daily   Or  . bisacodyl  10 mg Rectal Daily  . carvedilol  3.125 mg Oral BID WC  . cholecalciferol  1,000 Units Oral Daily  .  docusate sodium  200 mg Oral Daily  . insulin aspart  0-24 Units Subcutaneous Q4H  . losartan  50 mg Oral Daily  . pantoprazole  40 mg Oral Daily  . rifampin  600 mg Oral Once  . sildenafil  60 mg Oral TID  . sodium chloride  3 mL Intravenous Q12H  . sodium chloride  3 mL Intravenous Q12H  . torsemide  20 mg Oral Daily  . Warfarin - Physician Dosing Inpatient   Does not  apply q1800    Infusions: . sodium chloride 20 mL/hr at 04/09/13 0700  . sodium chloride 20 mL/hr at 04/07/13 0700  . sodium chloride    . lactated ringers 12 mL/hr at 04/09/13 0700  . milrinone 0.375 mcg/kg/min (04/09/13 0700)    PRN Medications: sodium chloride, acetaminophen, ALPRAZolam, morphine injection, ondansetron (ZOFRAN) IV, sodium chloride, sodium chloride, traMADol   Assessment:   1. Acute on chronic systolic HF due to NICM     --s/p HM II VAD on 04/06/13 2. PAH 3. Chronic AF  - now in NSR 4. VT  Plan/Discussion:    Doing very well POD #3. Can wean milrinone to 0.25 and see how she does. Continue current VAD speed. Weight still up about 5 pounds but CVPs low so will hold diuretics. Can start torsemide 20 daily. MAPs improved. Can continue amio for now. INR at 1.4. Can consider starting low dose heparin in day or two if needed.   Still with post-op ileus. Will ask surgical team about starting Reglan.   I reviewed the LVAD parameters from today, and compared the results to the patient's prior recorded data.  No programming changes were made.  The LVAD is functioning within specified parameters.  The patient performs LVAD self-test daily.  LVAD interrogation was negative for any significant power changes, alarms or PI events/speed drops.  LVAD equipment check completed and is in good working order.  Back-up equipment present.   LVAD education done on emergency procedures and precautions and reviewed exit site care.  The patient is critically ill with multiple organ systems failure and requires high complexity decision making for assessment and support, frequent evaluation and titration of therapies, application of advanced monitoring technologies and extensive interpretation of multiple databases.   Critical Care Time devoted to patient care services described in this note is 35 Minutes.   Length of Stay: 4  Arvilla Meres 04/09/2013, 7:32 AM  VAD Team Pager [phone]  (7am - 7am)

## 2013-04-10 ENCOUNTER — Inpatient Hospital Stay (HOSPITAL_COMMUNITY): Payer: Medicare Other

## 2013-04-10 LAB — CARBOXYHEMOGLOBIN
Carboxyhemoglobin: 1.8 % — ABNORMAL HIGH (ref 0.5–1.5)
Methemoglobin: 1.3 % (ref 0.0–1.5)
O2 Saturation: 75.4 %
Total hemoglobin: 9.1 g/dL — ABNORMAL LOW (ref 12.0–16.0)

## 2013-04-10 LAB — BASIC METABOLIC PANEL
BUN: 10 mg/dL (ref 6–23)
CO2: 28 mEq/L (ref 19–32)
Calcium: 8.9 mg/dL (ref 8.4–10.5)
Chloride: 103 mEq/L (ref 96–112)
Creatinine, Ser: 0.82 mg/dL (ref 0.50–1.10)
GFR calc Af Amer: 79 mL/min — ABNORMAL LOW (ref >90)
GFR calc non Af Amer: 68 mL/min — ABNORMAL LOW (ref >90)
Glucose, Bld: 97 mg/dL (ref 70–99)
Potassium: 3.6 mEq/L (ref 3.5–5.1)
Sodium: 138 mEq/L (ref 135–145)

## 2013-04-10 LAB — GLUCOSE, CAPILLARY
Glucose-Capillary: 107 mg/dL — ABNORMAL HIGH (ref 70–99)
Glucose-Capillary: 112 mg/dL — ABNORMAL HIGH (ref 70–99)
Glucose-Capillary: 84 mg/dL (ref 70–99)
Glucose-Capillary: 84 mg/dL (ref 70–99)
Glucose-Capillary: 97 mg/dL (ref 70–99)
Glucose-Capillary: 98 mg/dL (ref 70–99)

## 2013-04-10 LAB — CBC WITH DIFFERENTIAL/PLATELET
Basophils Absolute: 0 10*3/uL (ref 0.0–0.1)
Basophils Relative: 0 % (ref 0–1)
Eosinophils Absolute: 0.6 10*3/uL (ref 0.0–0.7)
Eosinophils Relative: 7 % — ABNORMAL HIGH (ref 0–5)
HCT: 26 % — ABNORMAL LOW (ref 36.0–46.0)
Hemoglobin: 8.6 g/dL — ABNORMAL LOW (ref 12.0–15.0)
Lymphocytes Relative: 12 % (ref 12–46)
Lymphs Abs: 1 10*3/uL (ref 0.7–4.0)
MCH: 31.2 pg (ref 26.0–34.0)
MCHC: 33.1 g/dL (ref 30.0–36.0)
MCV: 94.2 fL (ref 78.0–100.0)
Monocytes Absolute: 1 10*3/uL (ref 0.1–1.0)
Monocytes Relative: 12 % (ref 3–12)
Neutro Abs: 5.9 10*3/uL (ref 1.7–7.7)
Neutrophils Relative %: 70 % (ref 43–77)
Platelets: 120 10*3/uL — ABNORMAL LOW (ref 150–400)
RBC: 2.76 MIL/uL — ABNORMAL LOW (ref 3.87–5.11)
RDW: 17.6 % — ABNORMAL HIGH (ref 11.5–15.5)
WBC: 8.5 10*3/uL (ref 4.0–10.5)

## 2013-04-10 LAB — MAGNESIUM: Magnesium: 2.1 mg/dL (ref 1.5–2.5)

## 2013-04-10 LAB — PROTIME-INR
INR: 2 — ABNORMAL HIGH (ref 0.00–1.49)
Prothrombin Time: 22.1 seconds — ABNORMAL HIGH (ref 11.6–15.2)

## 2013-04-10 LAB — PHOSPHORUS: Phosphorus: 2.8 mg/dL (ref 2.3–4.6)

## 2013-04-10 LAB — LACTATE DEHYDROGENASE: LDH: 477 U/L — ABNORMAL HIGH (ref 94–250)

## 2013-04-10 MED ORDER — INSULIN ASPART 100 UNIT/ML ~~LOC~~ SOLN: 0.0000 [IU] | Freq: Three times a day (TID) | SUBCUTANEOUS | Status: DC

## 2013-04-10 MED ORDER — POTASSIUM CHLORIDE 10 MEQ/50ML IV SOLN
10.0000 meq | INTRAVENOUS | Status: AC | PRN
  Administered 2013-04-10 (×3): 10 meq via INTRAVENOUS
  Filled 2013-04-10: qty 50
  Filled 2013-04-10: qty 100

## 2013-04-10 MED ORDER — DIGOXIN 125 MCG PO TABS
0.1250 mg | ORAL_TABLET | Freq: Every day | ORAL | Status: DC
  Administered 2013-04-10 – 2013-04-22 (×13): 0.125 mg via ORAL
  Filled 2013-04-10 (×13): qty 1

## 2013-04-10 MED ORDER — WARFARIN SODIUM 4 MG PO TABS
4.0000 mg | ORAL_TABLET | Freq: Once | ORAL | Status: AC
  Administered 2013-04-10: 4 mg via ORAL
  Filled 2013-04-10: qty 1

## 2013-04-10 MED ORDER — TORSEMIDE 20 MG PO TABS
20.0000 mg | ORAL_TABLET | Freq: Every day | ORAL | Status: DC
  Administered 2013-04-10: 20 mg via ORAL
  Filled 2013-04-10 (×2): qty 1

## 2013-04-10 MED ORDER — SILDENAFIL CITRATE 20 MG PO TABS
40.0000 mg | ORAL_TABLET | Freq: Three times a day (TID) | ORAL | Status: DC
  Administered 2013-04-10 – 2013-04-16 (×21): 40 mg via ORAL
  Filled 2013-04-10 (×26): qty 2

## 2013-04-10 NOTE — Progress Notes (Signed)
Echo Lab  2D Echocardiogram completed.  Zarayah Lanting L Waynesha Rammel, RDCS 04/10/2013 10:40 AM

## 2013-04-10 NOTE — Progress Notes (Signed)
Patient ID: Brianna Hanson, female   DOB: [birth year], 78 y.o.   MRN: [id number] [facility] 2 Rounding Note  Subjective:    78 y/o female with severe CHF due to NICM, PAH, chronic AF underwent HM II VAD implant 04/06/13  Had some VT after speed turned up. Amio started and speed turned back to 8800. Ectopy resolved.  Had AF with RVR yesterday (130-140s). Converted with IV amio. Also on digoxin.  Milrinone turned down to 0.25 yesterday.   Sitting in chair feels good. Foley out.   No alarms or PI events. Weight back up 3 pounds.  Had BM this morning.   Co-ox 75  CVP 9-11 range MAP about 76-88  INR 2.00  LVAD INTERROGATION:  HeartMate II LVAD:  Flow -- liters/min, speed 8800, power 4.5, PI 7.3   Occasional PI events. And 2 low flow alarms during AF (no further alarms/events since being back in NSRA)  Objective:    Vital Signs:   Temp:  [98.2 F (36.8 C)-98.8 F (37.1 C)] 98.8 F (37.1 C) (08/09 0349) Pulse Rate:  [29-132] 77 (08/09 0600) Resp:  [15-32] 22 (08/09 0600) SpO2:  [93 %-99 %] 97 % (08/09 0600) Weight:  [77.6 kg (171 lb 1.2 oz)] 77.6 kg (171 lb 1.2 oz) (08/09 0457) Last BM Date: 04/06/13 Mean arterial Pressure 80s   Intake/Output:   Intake/Output Summary (Last 24 hours) at 04/10/13 0730 Last data filed at 04/10/13 0601  Gross per 24 hour  Intake 1751.27 ml  Output    690 ml  Net 1061.27 ml     Physical Exam: General: Awake comfortable HEENT: Normal Neck: supple. JVP jaw . Carotids 2+ bilat; no bruits. No lymphadenopathy or thryomegaly appreciated. Cor: Mechanical heart sounds with LVAD hum present. Dressing c/d/i Lungs: clear Abdomen: soft, nontender, + distended. hypoactive BS Driveline: C/D/I; securement device intact and driveline incorporated Extremities: no cyanosis, clubbing, rash, Tr edema  Neuro: awake alert. Pleasant. nonfocal  Telemetry: sinus 80-90  AF with RVR yesterday  Labs: Basic Metabolic Panel:  Recent Labs Lab 04/06/13 1455   04/07/13 0408 04/07/13 0500 04/07/13 1628 04/07/13 1630 04/08/13 0400 04/08/13 1400 04/09/13 0425 04/10/13 0400  NA 145  < > 145  --  146*  --  141  --  140 138  K 2.5*  < > 4.2  --  3.8  --  3.9 4.1 3.9 3.6  CL 112  < > 115*  --  111  --  110  --  105 103  CO2 22  --  23  --   --   --  23  --  29 28  GLUCOSE 118*  < > 104*  --  91  --  87  --  96 97  BUN 9  < > 9  --  6  --  8  --  10 10  CREATININE 0.90  < > 0.91  --  0.90 0.86 0.85  --  0.91 0.82  CALCIUM 8.4  --  8.7  --   --   --  8.9  --  8.9 8.9  MG 1.7  < >  --  2.7*  --  2.3 2.3  --  2.1 2.1  PHOS  --   --   --  3.2  --   --  3.4  --  3.0 2.8  < > = values in this interval not displayed.  Liver Function Tests:  Recent Labs Lab 04/05/13 1220 04/07/13 0408 04/07/13 0500  04/09/13 0425  AST 17 107*  --  80*  ALT 11 18  --  21  ALKPHOS 90  --   --  70  BILITOT 0.8  --  1.9* 0.8  PROT 7.3  --   --  5.8*  ALBUMIN 3.1*  --   --  2.7*   No results found for this basename: LIPASE, AMYLASE,  in the last 168 hours No results found for this basename: AMMONIA,  in the last 168 hours  CBC:  Recent Labs Lab 04/05/13 1220  04/07/13 0408 04/07/13 1628 04/07/13 1630 04/08/13 0400 04/09/13 0425 04/10/13 0400  WBC 4.6  < > 10.9*  --  10.6* 12.5* 13.6* 8.5  NEUTROABS 2.5  --  9.1*  --   --  9.9* 10.8* 5.9  HGB 12.4  < > 9.2* 9.5* 9.5* 9.5* 9.6* 8.6*  HCT 36.6  < > 27.6* 28.0* 28.2* 28.3* 28.5* 26.0*  MCV 92.7  < > 92.6  --  93.4 93.7 94.4 94.2  PLT 188  < > 125*  --  111* 108* 125* 120*  < > = values in this interval not displayed.  INR: 1.39  Recent Labs Lab 04/06/13 1455 04/07/13 0408 04/08/13 0400 04/09/13 0425 04/10/13 0400  INR 1.59* 1.39 1.51* 1.44 2.00*   LDH 392  Coox 79.7% Arterial sat 99%  Imaging: Dg Chest Port 1 View  04/09/2013   *RADIOLOGY REPORT*  Clinical Data: Post LVAD placement  PORTABLE CHEST - 1 VIEW  Comparison: 04/08/2013; 04/07/2013; 04/06/2013  Findings:  Grossly unchanged  enlarged cardiac silhouette and mediastinal contours.  Interval removal of midline mediastinal drain. Otherwise, stable positioning of support apparatus.  No pneumothorax.  Pulmonary vasculature remains indistinct with cephalization of flow.  Grossly unchanged small layering bilateral effusions associated bibasilar heterogeneous / consolidative opacities, left greater than right.  No new focal airspace opacities.  Unchanged bones.  IMPRESSION: 1.  Interval removal of midline mediastinal drain.  Otherwise, stable positioning of remaining support apparatus.  No pneumothorax. 2.  Persistent findings of pulmonary edema, small bilateral effusions and bibasilar opacities, likely atelectasis.   Original Report Authenticated By: Tacey Ruiz, MD    CXR this am with bibasilar atelectasis and mild interstitial edema  Medications:     Scheduled Medications: . amiodarone  200 mg Oral Daily  . aspirin EC  325 mg Oral Daily   Or  . aspirin  324 mg Per Tube Daily   Or  . aspirin  300 mg Rectal Daily  . bisacodyl  10 mg Oral Daily   Or  . bisacodyl  10 mg Rectal Daily  . carvedilol  3.125 mg Oral BID WC  . cholecalciferol  1,000 Units Oral Daily  . digoxin  0.25 mg Intravenous Daily  . docusate sodium  200 mg Oral Daily  . feeding supplement  237 mL Oral BID BM  . insulin aspart  0-24 Units Subcutaneous Q4H  . losartan  50 mg Oral Daily  . pantoprazole  40 mg Oral Daily  . rifampin  600 mg Oral Once  . sildenafil  60 mg Oral TID  . sodium chloride  3 mL Intravenous Q12H  . sodium chloride  3 mL Intravenous Q12H  . Warfarin - Physician Dosing Inpatient   Does not apply q1800    Infusions: . sodium chloride 20 mL/hr at 04/09/13 1446  . sodium chloride 20 mL/hr at 04/07/13 0700  . sodium chloride    . amiodarone (NEXTERONE PREMIX) 360 mg/200  mL dextrose 30 mg/hr (04/09/13 2157)  . milrinone 0.25 mcg/kg/min (04/10/13 0312)    PRN Medications: sodium chloride, acetaminophen, ALPRAZolam, morphine  injection, ondansetron (ZOFRAN) IV, potassium chloride, sodium chloride, sodium chloride, traMADol   Assessment:   1. Acute on chronic systolic HF due to NICM     --s/p HM II VAD on 04/06/13 2. PAH 3. Acute and chronic AF  - now in NSR 4. VT 5. Hypokalemia  Plan/Discussion:    Doing very well POD #4. Will continue to wean milrinone to 0.125. Weight climbing again so will start torsemide 20 daily. MAPs improved.  She has chronic AF prior to surgery so not sure she will maintain SR even on amio but will continue amio for now. If reverts to AF will switch carvedilol to Toprol for better rate control. INR 2.0  Will ambulate today.   I reviewed the LVAD parameters from today, and compared the results to the patient's prior recorded data.  No programming changes were made.  The LVAD is functioning within specified parameters.  The patient performs LVAD self-test daily.  LVAD interrogation was negative for any significant power changes, alarms or PI events/speed drops.  LVAD equipment check completed and is in good working order.  Back-up equipment present.   LVAD education done on emergency procedures and precautions and reviewed exit site care.  The patient is critically ill with multiple organ systems failure and requires high complexity decision making for assessment and support, frequent evaluation and titration of therapies, application of advanced monitoring technologies and extensive interpretation of multiple databases.   Critical Care Time devoted to patient care services described in this note is 35 Minutes.   Length of Stay: 5  Arvilla Meres 04/10/2013, 7:30 AM  VAD Team Pager [phone] (7am - 7am)

## 2013-04-10 NOTE — Progress Notes (Signed)
Patient ID: Brianna Hanson, female   DOB: 06/07/1935, 78 y.o.   MRN: [id number] Heart[facility] 2 Rounding Note  Subjective:    POD #4 VAD for nonischemic CM Converted to NSR on IV amio- slow rate now--will stop IV amio,cont po dose OOB to chair, + BM Has not ambulated yet Incisions clean and dry Min output from pocket drain- will DC tomorrow 2D Echo reviewed- septum midline,Ao valve not opening, no TR , no effusion   RV with mil-mod dysfunction  VAD  rate at 8800 rpm No PI events today CXR wet --po demedex restarted   LVAD INTERROGATION:  HeartMate II LVAD:  Flow --- liters/min, speed 8800, power 4.2, PI 4.8.    Objective:    Vital Signs:   Temp:  [97.9 F (36.6 C)-98.8 F (37.1 C)] 97.9 F (36.6 C) (08/09 1535) Pulse Rate:  [73-132] 77 (08/09 1300) Resp:  [19-32] 22 (08/09 1300) SpO2:  [94 %-100 %] 96 % (08/09 1300) Weight:  [171 lb 1.2 oz (77.6 kg)] 171 lb 1.2 oz (77.6 kg) (08/09 0457) Last BM Date: 04/06/13 Mean arterial Pressure  CVP 9 MAP 82 Intake/Output:   Intake/Output Summary (Last 24 hours) at 04/10/13 1552 Last data filed at 04/10/13 1300  Gross per 24 hour  Intake 1291.53 ml  Output   1541 ml  Net -249.47 ml     Physical Exam: General:  Well appearing. No resp difficulty HEENT: normal Neck: supple.  Cor:  LVAD hum present. I don't hear valve sounds Lungs: decreased in bases Abdomen: soft, nontender, mildly distended. No hepatosplenomegaly. No bruits or masses. Good bowel sounds. Driveline: C/D/I; securement device intact and driveline incorporated Extremities: no cyanosis, moderate ankle edema Neuro: alert & orientedx3, cranial nerves grossly intact. moves all 4 extremities w/o difficulty. Affect pleasant  Telemetry: [id number]  Labs: Basic Metabolic Panel:  Recent Labs Lab 04/06/13 1455  04/07/13 0408 04/07/13 0500 04/07/13 1628 04/07/13 1630 04/08/13 0400 04/08/13 1400 04/09/13 0425 04/10/13 0400  NA 145  < > 145  --  146*  --  141  --  140 138   K 2.5*  < > 4.2  --  3.8  --  3.9 4.1 3.9 3.6  CL 112  < > 115*  --  111  --  110  --  105 103  CO2 22  --  23  --   --   --  23  --  29 28  GLUCOSE 118*  < > 104*  --  91  --  87  --  96 97  BUN 9  < > 9  --  6  --  8  --  10 10  CREATININE 0.90  < > 0.91  --  0.90 0.86 0.85  --  0.91 0.82  CALCIUM 8.4  --  8.7  --   --   --  8.9  --  8.9 8.9  MG 1.7  < >  --  2.7*  --  2.3 2.3  --  2.1 2.1  PHOS  --   --   --  3.2  --   --  3.4  --  3.0 2.8  < > = values in this interval not displayed.  Liver Function Tests:  Recent Labs Lab 04/05/13 1220 04/07/13 0408 04/07/13 0500 04/09/13 0425  AST 17 107*  --  80*  ALT 11 18  --  21  ALKPHOS 90  --   --  70  BILITOT 0.8  --  1.9* 0.8  PROT 7.3  --   --  5.8*  ALBUMIN 3.1*  --   --  2.7*   No results found for this basename: LIPASE, AMYLASE,  in the last 168 hours No results found for this basename: AMMONIA,  in the last 168 hours  CBC:  Recent Labs Lab 04/05/13 1220  04/07/13 0408 04/07/13 1628 04/07/13 1630 04/08/13 0400 04/09/13 0425 04/10/13 0400  WBC 4.6  < > 10.9*  --  10.6* 12.5* 13.6* 8.5  NEUTROABS 2.5  --  9.1*  --   --  9.9* 10.8* 5.9  HGB 12.4  < > 9.2* 9.5* 9.5* 9.5* 9.6* 8.6*  HCT 36.6  < > 27.6* 28.0* 28.2* 28.3* 28.5* 26.0*  MCV 92.7  < > 92.6  --  93.4 93.7 94.4 94.2  PLT 188  < > 125*  --  111* 108* 125* 120*  < > = values in this interval not displayed.  INR:  Recent Labs Lab 04/06/13 1455 04/07/13 0408 04/08/13 0400 04/09/13 0425 04/10/13 0400  INR 1.59* 1.39 1.51* 1.44 2.00*   LDH 392 <547 <550 Coox 73%  Pulse ox sat 96%  Other results:  EKG:   Imaging: Dg Chest Port 1 View  04/10/2013   *RADIOLOGY REPORT*  Clinical Data: L V A D  PORTABLE CHEST - 1 VIEW  Comparison: Prior chest x-ray 04/21/2013  Findings: Stable left subclavian approach cardiac rhythm maintenance device with leads projecting over the right atrium and right ventricle.  A left ventricular assist device projects over the  left chest and unchanged position and configuration.  Left IJ approach central venous catheter is also stable.  The tip projects over the upper SVC.  Of note, the tip is directed nearly perpendicular toward the lateral wall of the cava.  The patient is status post median sternotomy.  The right IJ vascular sheath has been removed.  Stable cardiomegaly.  Similar appearance of bilateral layering pleural effusions, bibasilar atelectasis and pulmonary vascular congestion.  No pneumothorax.  IMPRESSION:  1.  Interval removal of right IJ vascular sheath.  Other support apparatus in stable and satisfactory position.  2.  No significant interval change in the appearance of the chest with cardiomegaly, bilateral layering effusions, bibasilar atelectasis and pulmonary vascular congestion.   Original Report Authenticated By: Malachy Moan, M.D.   Dg Chest Port 1 View  04/09/2013   *RADIOLOGY REPORT*  Clinical Data: Post LVAD placement  PORTABLE CHEST - 1 VIEW  Comparison: 04/08/2013; 04/07/2013; 04/06/2013  Findings:  Grossly unchanged enlarged cardiac silhouette and mediastinal contours.  Interval removal of midline mediastinal drain. Otherwise, stable positioning of support apparatus.  No pneumothorax.  Pulmonary vasculature remains indistinct with cephalization of flow.  Grossly unchanged small layering bilateral effusions associated bibasilar heterogeneous / consolidative opacities, left greater than right.  No new focal airspace opacities.  Unchanged bones.  IMPRESSION: 1.  Interval removal of midline mediastinal drain.  Otherwise, stable positioning of remaining support apparatus.  No pneumothorax. 2.  Persistent findings of pulmonary edema, small bilateral effusions and bibasilar opacities, likely atelectasis.   Original Report Authenticated By: Tacey Ruiz, MD     Medications:     Scheduled Medications: . amiodarone  200 mg Oral Daily  . aspirin EC  325 mg Oral Daily  . bisacodyl  10 mg Oral Daily   Or   . bisacodyl  10 mg Rectal Daily  . carvedilol  3.125 mg Oral BID WC  . cholecalciferol  1,000 Units  Oral Daily  . digoxin  0.125 mg Oral Daily  . docusate sodium  200 mg Oral Daily  . feeding supplement  237 mL Oral BID BM  . insulin aspart  0-24 Units Subcutaneous TID WC  . losartan  50 mg Oral Daily  . pantoprazole  40 mg Oral Daily  . sildenafil  40 mg Oral TID  . sodium chloride  3 mL Intravenous Q12H  . torsemide  20 mg Oral Daily  . warfarin  4 mg Oral ONCE-1800  . Warfarin - Physician Dosing Inpatient   Does not apply q1800    Infusions: . sodium chloride 20 mL/hr at 04/09/13 1446  . sodium chloride 20 mL/hr at 04/07/13 0700  . sodium chloride    . milrinone 0.125 mcg/kg/min (04/10/13 0742)    PRN Medications: acetaminophen, ALPRAZolam, ondansetron (ZOFRAN) IV, sodium chloride, traMADol   Assessment:  She is doing well. Needs to start walking with PT  Plan/Discussion:    .   Milrinone decreased to 0.125 today.  INR  2.0. Will give 4 mg coumadin today Pocket drain put out 50 cc serosanguinous for last shift so will leave it in today.  Continue IS and mobilization.   I reviewed the LVAD parameters from today, and compared the results to the patient's prior recorded data.  No programming changes were made.  The LVAD is functioning within specified parameters.  The patient performs LVAD self-test daily.  LVAD interrogation was negative for any significant power changes, alarms or PI events/speed drops.  LVAD equipment check completed and is in good working order.  Back-up equipment present.   LVAD education done on emergency procedures and precautions and reviewed exit site care.  Length of Stay: 5  Brianna Hanson,Brianna Hanson 04/10/2013, 3:52 PM  VAD Team Pager [phone] (7am - 7am)

## 2013-04-11 ENCOUNTER — Inpatient Hospital Stay (HOSPITAL_COMMUNITY): Payer: Medicare Other

## 2013-04-11 LAB — CARBOXYHEMOGLOBIN
Carboxyhemoglobin: 1.8 % — ABNORMAL HIGH (ref 0.5–1.5)
Methemoglobin: 0.7 % (ref 0.0–1.5)
O2 Saturation: 78.3 %
Total hemoglobin: 8.7 g/dL — ABNORMAL LOW (ref 12.0–16.0)

## 2013-04-11 LAB — BASIC METABOLIC PANEL
BUN: 9 mg/dL (ref 6–23)
CO2: 29 mEq/L (ref 19–32)
Calcium: 8.6 mg/dL (ref 8.4–10.5)
Chloride: 101 mEq/L (ref 96–112)
Creatinine, Ser: 0.86 mg/dL (ref 0.50–1.10)
GFR calc Af Amer: 74 mL/min — ABNORMAL LOW (ref >90)
GFR calc non Af Amer: 64 mL/min — ABNORMAL LOW (ref >90)
Glucose, Bld: 98 mg/dL (ref 70–99)
Potassium: 3.6 mEq/L (ref 3.5–5.1)
Sodium: 136 mEq/L (ref 135–145)

## 2013-04-11 LAB — CBC WITH DIFFERENTIAL/PLATELET
Basophils Absolute: 0 10*3/uL (ref 0.0–0.1)
Basophils Relative: 0 % (ref 0–1)
Eosinophils Absolute: 0.6 10*3/uL (ref 0.0–0.7)
Eosinophils Relative: 8 % — ABNORMAL HIGH (ref 0–5)
HCT: 26.5 % — ABNORMAL LOW (ref 36.0–46.0)
Hemoglobin: 8.8 g/dL — ABNORMAL LOW (ref 12.0–15.0)
Lymphocytes Relative: 13 % (ref 12–46)
Lymphs Abs: 1 10*3/uL (ref 0.7–4.0)
MCH: 31.1 pg (ref 26.0–34.0)
MCHC: 33.2 g/dL (ref 30.0–36.0)
MCV: 93.6 fL (ref 78.0–100.0)
Monocytes Absolute: 1.3 10*3/uL — ABNORMAL HIGH (ref 0.1–1.0)
Monocytes Relative: 17 % — ABNORMAL HIGH (ref 3–12)
Neutro Abs: 4.8 10*3/uL (ref 1.7–7.7)
Neutrophils Relative %: 62 % (ref 43–77)
Platelets: 135 10*3/uL — ABNORMAL LOW (ref 150–400)
RBC: 2.83 MIL/uL — ABNORMAL LOW (ref 3.87–5.11)
RDW: 17.3 % — ABNORMAL HIGH (ref 11.5–15.5)
WBC: 7.7 10*3/uL (ref 4.0–10.5)

## 2013-04-11 LAB — GLUCOSE, CAPILLARY
Glucose-Capillary: 87 mg/dL (ref 70–99)
Glucose-Capillary: 103 mg/dL — ABNORMAL HIGH (ref 70–99)
Glucose-Capillary: 98 mg/dL (ref 70–99)

## 2013-04-11 LAB — PROTIME-INR
INR: 2.48 — ABNORMAL HIGH (ref 0.00–1.49)
Prothrombin Time: 26 seconds — ABNORMAL HIGH (ref 11.6–15.2)

## 2013-04-11 LAB — PHOSPHORUS: Phosphorus: 2.4 mg/dL (ref 2.3–4.6)

## 2013-04-11 LAB — MAGNESIUM: Magnesium: 1.8 mg/dL (ref 1.5–2.5)

## 2013-04-11 LAB — LACTATE DEHYDROGENASE: LDH: 449 U/L — ABNORMAL HIGH (ref 94–250)

## 2013-04-11 MED ORDER — POTASSIUM CHLORIDE 10 MEQ/50ML IV SOLN
10.0000 meq | Freq: Once | INTRAVENOUS | Status: DC
Start: 2013-04-11 — End: 2013-04-11

## 2013-04-11 MED ORDER — POTASSIUM CHLORIDE 10 MEQ/50ML IV SOLN
10.0000 meq | INTRAVENOUS | Status: DC | PRN
  Administered 2013-04-11: 10 meq via INTRAVENOUS
  Filled 2013-04-11: qty 100

## 2013-04-11 MED ORDER — WARFARIN SODIUM 2.5 MG PO TABS
2.5000 mg | ORAL_TABLET | Freq: Once | ORAL | Status: AC
  Administered 2013-04-11: 2.5 mg via ORAL
  Filled 2013-04-11: qty 1

## 2013-04-11 MED ORDER — AMIODARONE HCL 200 MG PO TABS
200.0000 mg | ORAL_TABLET | Freq: Two times a day (BID) | ORAL | Status: DC
  Administered 2013-04-11 – 2013-04-20 (×19): 200 mg via ORAL
  Filled 2013-04-11 (×21): qty 1

## 2013-04-11 MED ORDER — TORSEMIDE 20 MG PO TABS
20.0000 mg | ORAL_TABLET | Freq: Two times a day (BID) | ORAL | Status: DC
  Administered 2013-04-11: 20 mg via ORAL
  Filled 2013-04-11 (×4): qty 1

## 2013-04-11 MED ORDER — WARFARIN SODIUM 2 MG PO TABS
2.0000 mg | ORAL_TABLET | Freq: Once | ORAL | Status: DC
  Filled 2013-04-11: qty 1

## 2013-04-11 MED ORDER — POTASSIUM CHLORIDE 10 MEQ/50ML IV SOLN
10.0000 meq | INTRAVENOUS | Status: AC
  Administered 2013-04-11 (×2): 10 meq via INTRAVENOUS
  Filled 2013-04-11: qty 50

## 2013-04-11 MED ORDER — POTASSIUM CHLORIDE 10 MEQ/50ML IV SOLN
INTRAVENOUS | Status: AC
  Administered 2013-04-11: 10 meq
  Filled 2013-04-11: qty 50

## 2013-04-11 NOTE — Progress Notes (Signed)
Patient ID: FANNY AGAN, female   DOB: 1934/12/03, 78 y.o.   MRN: [id number] HeartMate 2 Rounding Note  Subjective:    POD #5 VAD for nonischemic CM Converted to NSR on IV amio- slow rate now--,cont po dose and Lanoxin  0.125 daily OOB to chair, + BM Has not ambulated yet, out of bed to chair doing well, Incisions clean and dry Min output from pocket drain- will DC  2D Echo reviewed- septum midline,Ao valve not opening, no TR , no effusion   RV with mil-mod dysfunction  VAD  rate at 8800 rpm Will increase LVAD RPM to 9000 because of increased PI 7.9 and  --- flow No PI events today CXR edema improved, still with atelectasis at left base --po demedex restarted   LVAD INTERROGATION:  HeartMate II LVAD:  Flow --- liters/min, speed 9000, power 4.2, PI 8.0.    Objective:    Vital Signs:   Temp:  [97.9 F (36.6 C)-98.9 F (37.2 C)] 98.3 F (36.8 C) (08/10 0757) Pulse Rate:  [69-80] 75 (08/10 0700) Resp:  [19-28] 19 (08/10 0700) SpO2:  [95 %-100 %] 97 % (08/10 0700) Weight:  [170 lb 6.7 oz (77.3 kg)] 170 lb 6.7 oz (77.3 kg) (08/10 0500) Last BM Date: 04/06/13 Mean arterial Pressure  CVP 9 MAP 78 Intake/Output:   Intake/Output Summary (Last 24 hours) at 04/11/13 0949 Last data filed at 04/11/13 0700  Gross per 24 hour  Intake  959.6 ml  Output   1502 ml  Net -542.4 ml     Physical Exam: General:  Well appearing. No resp difficulty HEENT: normal Neck: supple.  Cor:  LVAD hum present. I don't hear valve sounds Lungs: decreased in bases Abdomen: soft, nontender, mildly distended. No hepatosplenomegaly. No bruits or masses. Good bowel sounds. Driveline: C/D/I; securement device intact and driveline incorporated Extremities: no cyanosis, moderate ankle edema Neuro: alert & orientedx3, cranial nerves grossly intact. moves all 4 extremities w/o difficulty. Affect pleasant  Telemetry: sinus60  Labs: Basic Metabolic Panel:  Recent Labs Lab 04/07/13 0408 04/07/13 0500  04/07/13 1628 04/07/13 1630 04/08/13 0400 04/08/13 1400 04/09/13 0425 04/10/13 0400 04/11/13 0400  NA 145  --  146*  --  141  --  140 138 136  K 4.2  --  3.8  --  3.9 4.1 3.9 3.6 3.6  CL 115*  --  111  --  110  --  105 103 101  CO2 23  --   --   --  23  --  29 28 29   GLUCOSE 104*  --  91  --  87  --  96 97 98  BUN 9  --  6  --  8  --  10 10 9   CREATININE 0.91  --  0.90 0.86 0.85  --  0.91 0.82 0.86  CALCIUM 8.7  --   --   --  8.9  --  8.9 8.9 8.6  MG  --  2.7*  --  2.3 2.3  --  2.1 2.1 1.8  PHOS  --  3.2  --   --  3.4  --  3.0 2.8 2.4    Liver Function Tests:  Recent Labs Lab 04/05/13 1220 04/07/13 0408 04/07/13 0500 04/09/13 0425  AST 17 107*  --  80*  ALT 11 18  --  21  ALKPHOS 90  --   --  70  BILITOT 0.8  --  1.9* 0.8  PROT 7.3  --   --  5.8*  ALBUMIN 3.1*  --   --  2.7*   No results found for this basename: LIPASE, AMYLASE,  in the last 168 hours No results found for this basename: AMMONIA,  in the last 168 hours  CBC:  Recent Labs Lab 04/07/13 0408  04/07/13 1630 04/08/13 0400 04/09/13 0425 04/10/13 0400 04/11/13 0400  WBC 10.9*  --  10.6* 12.5* 13.6* 8.5 7.7  NEUTROABS 9.1*  --   --  9.9* 10.8* 5.9 4.8  HGB 9.2*  < > 9.5* 9.5* 9.6* 8.6* 8.8*  HCT 27.6*  < > 28.2* 28.3* 28.5* 26.0* 26.5*  MCV 92.6  --  93.4 93.7 94.4 94.2 93.6  PLT 125*  --  111* 108* 125* 120* 135*  < > = values in this interval not displayed.  INR:2.47  Recent Labs Lab 04/07/13 0408 04/08/13 0400 04/09/13 0425 04/10/13 0400 04/11/13 0400  INR 1.39 1.51* 1.44 2.00* 2.48*   LDH 392 <547 <550 Coox 71%  Pulse ox sat 96%  Other results:  EKG:   Imaging: Dg Chest Port 1 View  04/11/2013   *RADIOLOGY REPORT*  Clinical Data: Follow up patient on ventricular assist device.  PORTABLE CHEST - 1 VIEW  Comparison: Chest radiograph performed 04/10/2013  Findings: The lungs remain hypoexpanded.  Retrocardiac airspace opacity may reflect atelectasis or possibly improving pulmonary  edema, given the appearance on the prior study.  The small left pleural effusion is likely present.  Mild right basilar airspace opacities again seen.  No pneumothorax is identified.  The cardiomediastinal silhouette is enlarged.  The patient is status post median sternotomy.  [name] ventricular assist device is again noted, in unchanged position.  A pacemaker/AICD is noted overlying the left chest wall, with leads ending overlying the right atrium and right ventricle.  No acute osseous abnormalities are seen.  IMPRESSION:  1.  Lungs remain hypoexpanded.  Interval improvement in pulmonary edema, with persistent retrocardiac and mild right basilar airspace opacities, likely reflecting atelectasis. 2.  Cardiomegaly noted.  Ventricular assist device is unchanged in appearance.   Original Report Authenticated By: Tonia Ghent, M.D.   Dg Chest Port 1 View  04/10/2013   *RADIOLOGY REPORT*  Clinical Data: L V A D  PORTABLE CHEST - 1 VIEW  Comparison: Prior chest x-ray 04/21/2013  Findings: Stable left subclavian approach cardiac rhythm maintenance device with leads projecting over the right atrium and right ventricle.  A left ventricular assist device projects over the left chest and unchanged position and configuration.  Left IJ approach central venous catheter is also stable.  The tip projects over the upper SVC.  Of note, the tip is directed nearly perpendicular toward the lateral wall of the cava.  The patient is status post median sternotomy.  The right IJ vascular sheath has been removed.  Stable cardiomegaly.  Similar appearance of bilateral layering pleural effusions, bibasilar atelectasis and pulmonary vascular congestion.  No pneumothorax.  IMPRESSION:  1.  Interval removal of right IJ vascular sheath.  Other support apparatus in stable and satisfactory position.  2.  No significant interval change in the appearance of the chest with cardiomegaly, bilateral layering effusions, bibasilar atelectasis and  pulmonary vascular congestion.   Original Report Authenticated By: Malachy Moan, M.D.     Medications:     Scheduled Medications: . amiodarone  200 mg Oral BID  . aspirin EC  325 mg Oral Daily  . bisacodyl  10 mg Oral Daily   Or  . bisacodyl  10 mg  Rectal Daily  . carvedilol  3.125 mg Oral BID WC  . cholecalciferol  1,000 Units Oral Daily  . digoxin  0.125 mg Oral Daily  . docusate sodium  200 mg Oral Daily  . feeding supplement  237 mL Oral BID BM  . insulin aspart  0-24 Units Subcutaneous TID WC  . losartan  50 mg Oral Daily  . pantoprazole  40 mg Oral Daily  . potassium chloride  10 mEq Intravenous Q1 Hr x 3  . sildenafil  40 mg Oral TID  . sodium chloride  3 mL Intravenous Q12H  . torsemide  20 mg Oral BID  . warfarin  2.5 mg Oral ONCE-1800  . Warfarin - Physician Dosing Inpatient   Does not apply q1800    Infusions: . sodium chloride 20 mL/hr at 04/09/13 1446  . sodium chloride 20 mL/hr at 04/07/13 0700  . sodium chloride      PRN Medications: acetaminophen, ALPRAZolam, ondansetron (ZOFRAN) IV, potassium chloride, sodium chloride, traMADol   Assessment:  She is doing well. Needs to start walking with PT  Plan/Discussion:    .   Milrinone wean off today  INR  2.5. Will give 2.5 mg coumadin today--- her preop daily dose Pocket drain has been minimal will remove it today.  Continue IS and mobilization. Needs to start walking with physical therapy   I reviewed the LVAD parameters from today, and compared the results to the patient's prior recorded data.  No programming changes were made.  The LVAD is functioning within specified parameters.  The patient performs LVAD self-test daily.  LVAD interrogation was negative for any significant power changes, alarms or PI events/speed drops.  LVAD equipment check completed and is in good working order.  Back-up equipment present.   LVAD education done on emergency procedures and precautions and reviewed exit site  care.  Length of Stay: 6  VAN TRIGT III,PETER 04/11/2013, 9:49 AM  VAD Team Pager [phone] (7am - 7am)

## 2013-04-11 NOTE — Progress Notes (Signed)
Patient ID: Brianna Hanson, female   DOB: 08/17/1935, 78 y.o.   MRN: [id number] HeartMate 2 Rounding Note  Subjective:    78 y/o female with severe CHF due to NICM, PAH, chronic AF underwent HM II VAD implant 04/06/13  Remaining in NSR. Now on oral amio.  Milrinone turned down to 0.125 yesterday. Echo looks good AoV not opening. Septum midline. RV mild to moderate dysfunction.   PT did not see her yesterday. Weight down 1 pound overnight with demadex. CXR improved. Complains of a little chest pain across entire chest worse with inspiration.  Co-ox 78  CVP 9-10  range MAP about 78  INR 2.4  LVAD INTERROGATION:  HeartMate II LVAD:  Flow -- liters/min, speed 8800, power 4.5, PI 8.0   No PI events in last 24 hours   Objective:    Vital Signs:   Temp:  [97.9 F (36.6 C)-98.9 F (37.2 C)] 98.2 F (36.8 C) (08/10 0000) Pulse Rate:  [69-85] 75 (08/10 0700) Resp:  [19-28] 19 (08/10 0700) SpO2:  [94 %-100 %] 97 % (08/10 0700) Weight:  [77.3 kg (170 lb 6.7 oz)] 77.3 kg (170 lb 6.7 oz) (08/10 0500) Last BM Date: 04/06/13 Mean arterial Pressure 80s   Intake/Output:   Intake/Output Summary (Last 24 hours) at 04/11/13 0742 Last data filed at 04/11/13 0700  Gross per 24 hour  Intake 1275.7 ml  Output   2003 ml  Net -727.3 ml     Physical Exam: General: Awake comfortable HEENT: Normal Neck: supple. JVP 9-10. Carotids 2+ bilat; no bruits. No lymphadenopathy or thryomegaly appreciated. Cor: Mechanical heart sounds with LVAD hum present. Dressing c/d/i Lungs:  ? Soft pleural rub. Decreased at bases with mild crackles Abdomen: soft, nontender, + mildly distended. hypoactive BS Driveline: C/D/I; securement device intact and driveline incorporated Extremities: no cyanosis, clubbing, rash, Tr edema  Neuro: awake alert. Pleasant. nonfocal  Telemetry: sinus 70.  Labs: Basic Metabolic Panel:  Recent Labs Lab 04/07/13 0408 04/07/13 0500 04/07/13 1628 04/07/13 1630 04/08/13 0400  04/08/13 1400 04/09/13 0425 04/10/13 0400 04/11/13 0400  NA 145  --  146*  --  141  --  140 138 136  K 4.2  --  3.8  --  3.9 4.1 3.9 3.6 3.6  CL 115*  --  111  --  110  --  105 103 101  CO2 23  --   --   --  23  --  29 28 29   GLUCOSE 104*  --  91  --  87  --  96 97 98  BUN 9  --  6  --  8  --  10 10 9   CREATININE 0.91  --  0.90 0.86 0.85  --  0.91 0.82 0.86  CALCIUM 8.7  --   --   --  8.9  --  8.9 8.9 8.6  MG  --  2.7*  --  2.3 2.3  --  2.1 2.1 1.8  PHOS  --  3.2  --   --  3.4  --  3.0 2.8 2.4    Liver Function Tests:  Recent Labs Lab 04/05/13 1220 04/07/13 0408 04/07/13 0500 04/09/13 0425  AST 17 107*  --  80*  ALT 11 18  --  21  ALKPHOS 90  --   --  70  BILITOT 0.8  --  1.9* 0.8  PROT 7.3  --   --  5.8*  ALBUMIN 3.1*  --   --  2.7*   No results found for this basename: LIPASE, AMYLASE,  in the last 168 hours No results found for this basename: AMMONIA,  in the last 168 hours  CBC:  Recent Labs Lab 04/07/13 0408  04/07/13 1630 04/08/13 0400 04/09/13 0425 04/10/13 0400 04/11/13 0400  WBC 10.9*  --  10.6* 12.5* 13.6* 8.5 7.7  NEUTROABS 9.1*  --   --  9.9* 10.8* 5.9 4.8  HGB 9.2*  < > 9.5* 9.5* 9.6* 8.6* 8.8*  HCT 27.6*  < > 28.2* 28.3* 28.5* 26.0* 26.5*  MCV 92.6  --  93.4 93.7 94.4 94.2 93.6  PLT 125*  --  111* 108* 125* 120* 135*  < > = values in this interval not displayed.  INR: 1.39  Recent Labs Lab 04/07/13 0408 04/08/13 0400 04/09/13 0425 04/10/13 0400 04/11/13 0400  INR 1.39 1.51* 1.44 2.00* 2.48*   LDH 392  Coox 79.7% Arterial sat 99%  Imaging: Dg Chest Port 1 View  04/11/2013   *RADIOLOGY REPORT*  Clinical Data: Follow up patient on ventricular assist device.  PORTABLE CHEST - 1 VIEW  Comparison: Chest radiograph performed 04/10/2013  Findings: The lungs remain hypoexpanded.  Retrocardiac airspace opacity may reflect atelectasis or possibly improving pulmonary edema, given the appearance on the prior study.  The small left pleural  effusion is likely present.  Mild right basilar airspace opacities again seen.  No pneumothorax is identified.  The cardiomediastinal silhouette is enlarged.  The patient is status post median sternotomy.  [name] ventricular assist device is again noted, in unchanged position.  A pacemaker/AICD is noted overlying the left chest wall, with leads ending overlying the right atrium and right ventricle.  No acute osseous abnormalities are seen.  IMPRESSION:  1.  Lungs remain hypoexpanded.  Interval improvement in pulmonary edema, with persistent retrocardiac and mild right basilar airspace opacities, likely reflecting atelectasis. 2.  Cardiomegaly noted.  Ventricular assist device is unchanged in appearance.   Original Report Authenticated By: Tonia Ghent, M.D.   Dg Chest Port 1 View  04/10/2013   *RADIOLOGY REPORT*  Clinical Data: L V A D  PORTABLE CHEST - 1 VIEW  Comparison: Prior chest x-ray 04/21/2013  Findings: Stable left subclavian approach cardiac rhythm maintenance device with leads projecting over the right atrium and right ventricle.  A left ventricular assist device projects over the left chest and unchanged position and configuration.  Left IJ approach central venous catheter is also stable.  The tip projects over the upper SVC.  Of note, the tip is directed nearly perpendicular toward the lateral wall of the cava.  The patient is status post median sternotomy.  The right IJ vascular sheath has been removed.  Stable cardiomegaly.  Similar appearance of bilateral layering pleural effusions, bibasilar atelectasis and pulmonary vascular congestion.  No pneumothorax.  IMPRESSION:  1.  Interval removal of right IJ vascular sheath.  Other support apparatus in stable and satisfactory position.  2.  No significant interval change in the appearance of the chest with cardiomegaly, bilateral layering effusions, bibasilar atelectasis and pulmonary vascular congestion.   Original Report Authenticated By: Malachy Moan, M.D.    CXR this am with bibasilar atelectasis and decreased edema  Medications:     Scheduled Medications: . amiodarone  200 mg Oral Daily  . aspirin EC  325 mg Oral Daily  . bisacodyl  10 mg Oral Daily   Or  . bisacodyl  10 mg Rectal Daily  . carvedilol  3.125 mg Oral  BID WC  . cholecalciferol  1,000 Units Oral Daily  . digoxin  0.125 mg Oral Daily  . docusate sodium  200 mg Oral Daily  . feeding supplement  237 mL Oral BID BM  . insulin aspart  0-24 Units Subcutaneous TID WC  . losartan  50 mg Oral Daily  . pantoprazole  40 mg Oral Daily  . sildenafil  40 mg Oral TID  . sodium chloride  3 mL Intravenous Q12H  . torsemide  20 mg Oral Daily  . Warfarin - Physician Dosing Inpatient   Does not apply q1800    Infusions: . sodium chloride 20 mL/hr at 04/09/13 1446  . sodium chloride 20 mL/hr at 04/07/13 0700  . sodium chloride    . milrinone 0.125 mcg/kg/min (04/10/13 0742)    PRN Medications: acetaminophen, ALPRAZolam, ondansetron (ZOFRAN) IV, potassium chloride, sodium chloride, traMADol   Assessment:   1. Acute on chronic systolic HF due to NICM     --s/p HM II VAD on 04/06/13 2. PAH 3. Acute and chronic AF  - now in NSR 4. VT 5. Hypokalemia  Plan/Discussion:    Doing very well POD #5. Will turn milrinone off. Increase demadex to 20 bid. Watch for PI events.   She has chronic AF prior to surgery so not sure she will maintain SR even on amio but will continue amio for now. If reverts to AF will switch carvedilol to Toprol for better rate control. INR climbing fairly quickly. Consider decreasing coumadin dosing.   Need to be aggressive with mobilization and IS today.    I reviewed the LVAD parameters from today, and compared the results to the patient's prior recorded data.  No programming changes were made.  The LVAD is functioning within specified parameters.  The patient performs LVAD self-test daily.  LVAD interrogation was negative for any  significant power changes, alarms or PI events/speed drops.  LVAD equipment check completed and is in good working order.  Back-up equipment present.   LVAD education done on emergency procedures and precautions and reviewed exit site care.  The patient is critically ill with multiple organ systems failure and requires high complexity decision making for assessment and support, frequent evaluation and titration of therapies, application of advanced monitoring technologies and extensive interpretation of multiple databases.   Critical Care Time devoted to patient care services described in this note is 35 Minutes.   Length of Stay: 6  Arvilla Meres 04/11/2013, 7:42 AM  VAD Team Pager [phone] (7am - 7am)

## 2013-04-11 NOTE — Progress Notes (Signed)
Physical Therapy Treatment Patient Details Name: Brianna Hanson MRN: [id number] DOB: 1935/02/23 Today's Date: 04/11/2013 Time: 1350-1420 PT Time Calculation (min): 30 min  PT Assessment / Plan / Recommendation  History of Present Illness 78 year old female with heart failure due severe non-ischemic cardiomyopathy on milrinone and revatio scheduled for Heartmate II LVAD tomorrow for destination therapy   PT Comments   Pt demonstrates some progress in mobility today. Could not perform increased ambulation as patient vest was not available for use. Will inquire with LVAD coordinators to provide vest for next session. Patient able to ambulate in room and perform transfers with less assist. Patient performed conversion to battery source with mod assist and multi modal cues.  Patient with difficulty connecting device lines together secondary to weakness. May benefit from practice device. Patient also able to recall sternal precautions from evaluation.  Will continue to work with patient and progress activity as tolerated.  Goal for ambulation next session.   Follow Up Recommendations  Home health PT;Supervision/Assistance - 24 hour           Equipment Recommendations  Other (comment) (rollator walker)    Recommendations for Other Services    Frequency Min 5X/week   Progress towards PT Goals Progress towards PT goals: Progressing toward goals  Plan Current plan remains appropriate    Precautions / Restrictions Precautions Precautions: Sternal;Fall Precaution Comments: educated patient on sternotomy incision and precautions to ensure healing Restrictions Weight Bearing Restrictions: Yes   Pertinent Vitals/Pain Patient reports some chest pain 3/10 at incision    Mobility  Bed Mobility Bed Mobility: Supine to Sit;Sitting - Scoot to Edge of Bed Supine to Sit: 3: Mod assist Sitting - Scoot to Edge of Bed: 3: Mod assist Details for Bed Mobility Assistance: Assist to elevate trunk and rotate  to EOB Transfers Transfers: Sit to Stand;Stand to Sit Sit to Stand: 4: Min assist Stand to Sit: 4: Min assist Details for Transfer Assistance: Patient with good stability upon standing, minial assist for elevation and positioning Ambulation/Gait Ambulation/Gait Assistance: 4: Min guard Ambulation Distance (Feet): 8 Feet Assistive device: 1 person hand held assist Gait Pattern: Step-to pattern Gait velocity: decreased General Gait Details: limited secondary to not having vest available for further ambulation    Exercises Other Exercises Other Exercises: LVAD conversion to power source x 2     PT Goals (current goals can now be found in the care plan section) Acute Rehab PT Goals Patient Stated Goal: to go home PT Goal Formulation: With patient Time For Goal Achievement: 04/22/13 Potential to Achieve Goals: Good  Visit Information  Last PT Received On: 04/11/13 Assistance Needed: +2 History of Present Illness: 78 year old female with heart failure due severe non-ischemic cardiomyopathy on milrinone and revatio scheduled for Heartmate II LVAD tomorrow for destination therapy    Subjective Data  Subjective: I have a little chest pain today Patient Stated Goal: to go home   Cognition  Cognition Arousal/Alertness: Awake/alert Behavior During Therapy: WFL for tasks assessed/performed Overall Cognitive Status: Within Functional Limits for tasks assessed    Balance  Static Sitting Balance Static Sitting - Balance Support: Feet supported Static Sitting - Level of Assistance: 5: Stand by assistance Static Standing Balance Static Standing - Balance Support: No upper extremity supported Static Standing - Level of Assistance: 5: Stand by assistance  End of Session PT - End of Session Equipment Utilized During Treatment: Oxygen Activity Tolerance: Patient tolerated treatment well;Patient limited by fatigue Patient left: in chair;with call  bell/phone within reach Nurse  Communication: Mobility status   GP     Fabio Asa 04/11/2013, 3:07 PM Charlotte Crumb, PT DPT  [phone]

## 2013-04-12 ENCOUNTER — Inpatient Hospital Stay (HOSPITAL_COMMUNITY): Payer: Medicare Other

## 2013-04-12 LAB — CBC WITH DIFFERENTIAL/PLATELET
Basophils Absolute: 0 10*3/uL (ref 0.0–0.1)
Basophils Relative: 0 % (ref 0–1)
Eosinophils Absolute: 0.5 10*3/uL (ref 0.0–0.7)
Eosinophils Relative: 7 % — ABNORMAL HIGH (ref 0–5)
HCT: 27.9 % — ABNORMAL LOW (ref 36.0–46.0)
Hemoglobin: 9.5 g/dL — ABNORMAL LOW (ref 12.0–15.0)
Lymphocytes Relative: 16 % (ref 12–46)
Lymphs Abs: 1.3 10*3/uL (ref 0.7–4.0)
MCH: 31.8 pg (ref 26.0–34.0)
MCHC: 34.1 g/dL (ref 30.0–36.0)
MCV: 93.3 fL (ref 78.0–100.0)
Monocytes Absolute: 1.6 10*3/uL — ABNORMAL HIGH (ref 0.1–1.0)
Monocytes Relative: 20 % — ABNORMAL HIGH (ref 3–12)
Neutro Abs: 4.6 10*3/uL (ref 1.7–7.7)
Neutrophils Relative %: 57 % (ref 43–77)
Platelets: 178 10*3/uL (ref 150–400)
RBC: 2.99 MIL/uL — ABNORMAL LOW (ref 3.87–5.11)
RDW: 17.4 % — ABNORMAL HIGH (ref 11.5–15.5)
WBC: 8.1 10*3/uL (ref 4.0–10.5)

## 2013-04-12 LAB — BASIC METABOLIC PANEL
BUN: 8 mg/dL (ref 6–23)
CO2: 30 mEq/L (ref 19–32)
Calcium: 8.7 mg/dL (ref 8.4–10.5)
Chloride: 100 mEq/L (ref 96–112)
Creatinine, Ser: 0.74 mg/dL (ref 0.50–1.10)
GFR calc Af Amer: >90 mL/min (ref >90)
GFR calc non Af Amer: 81 mL/min — ABNORMAL LOW (ref >90)
Glucose, Bld: 84 mg/dL (ref 70–99)
Potassium: 3.9 mEq/L (ref 3.5–5.1)
Sodium: 138 mEq/L (ref 135–145)

## 2013-04-12 LAB — MAGNESIUM: Magnesium: 1.8 mg/dL (ref 1.5–2.5)

## 2013-04-12 LAB — GLUCOSE, CAPILLARY
Glucose-Capillary: 90 mg/dL (ref 70–99)
Glucose-Capillary: 120 mg/dL — ABNORMAL HIGH (ref 70–99)

## 2013-04-12 LAB — PHOSPHORUS: Phosphorus: 2.8 mg/dL (ref 2.3–4.6)

## 2013-04-12 LAB — PROTIME-INR
INR: 2.39 — ABNORMAL HIGH (ref 0.00–1.49)
Prothrombin Time: 25.3 seconds — ABNORMAL HIGH (ref 11.6–15.2)

## 2013-04-12 LAB — CARBOXYHEMOGLOBIN
Carboxyhemoglobin: 2.1 % — ABNORMAL HIGH (ref 0.5–1.5)
Methemoglobin: 0.7 % (ref 0.0–1.5)
O2 Saturation: 69.6 %
Total hemoglobin: 9.4 g/dL — ABNORMAL LOW (ref 12.0–16.0)

## 2013-04-12 LAB — LACTATE DEHYDROGENASE: LDH: 558 U/L — ABNORMAL HIGH (ref 94–250)

## 2013-04-12 MED ORDER — GUAIFENESIN ER 600 MG PO TB12
600.0000 mg | ORAL_TABLET | Freq: Two times a day (BID) | ORAL | Status: DC | PRN
  Filled 2013-04-12: qty 1

## 2013-04-12 MED ORDER — TORSEMIDE 20 MG PO TABS
20.0000 mg | ORAL_TABLET | Freq: Every day | ORAL | Status: DC
  Administered 2013-04-12 – 2013-04-13 (×2): 20 mg via ORAL
  Filled 2013-04-12 (×3): qty 1

## 2013-04-12 MED ORDER — WARFARIN - PHYSICIAN DOSING INPATIENT: Freq: Every day | Status: DC

## 2013-04-12 MED ORDER — SODIUM CHLORIDE 0.9 % IJ SOLN: 3.0000 mL | Freq: Two times a day (BID) | INTRAMUSCULAR | Status: DC

## 2013-04-12 MED ORDER — WARFARIN SODIUM 2.5 MG PO TABS
2.5000 mg | ORAL_TABLET | Freq: Once | ORAL | Status: AC
  Administered 2013-04-12: 2.5 mg via ORAL
  Filled 2013-04-12: qty 1

## 2013-04-12 MED ORDER — SODIUM CHLORIDE 0.9 % IJ SOLN
10.0000 mL | INTRAMUSCULAR | Status: DC | PRN
  Administered 2013-04-14 – 2013-04-16 (×2): 10 mL

## 2013-04-12 MED ORDER — FE FUMARATE-B12-VIT C-FA-IFC PO CAPS
1.0000 | ORAL_CAPSULE | Freq: Three times a day (TID) | ORAL | Status: DC
  Administered 2013-04-12 – 2013-04-21 (×27): 1 via ORAL
  Filled 2013-04-12 (×35): qty 1

## 2013-04-12 MED ORDER — TORSEMIDE 20 MG PO TABS
20.0000 mg | ORAL_TABLET | Freq: Two times a day (BID) | ORAL | Status: DC
  Filled 2013-04-12 (×3): qty 1

## 2013-04-12 MED ORDER — SODIUM CHLORIDE 0.9 % IV SOLN: 250.0000 mL | INTRAVENOUS | Status: DC | PRN

## 2013-04-12 MED ORDER — MOVING RIGHT ALONG BOOK
Freq: Once | Status: AC
  Administered 2013-04-12: 10:00:00
  Filled 2013-04-12: qty 1

## 2013-04-12 MED ORDER — POTASSIUM CHLORIDE CRYS ER 20 MEQ PO TBCR
20.0000 meq | EXTENDED_RELEASE_TABLET | Freq: Every day | ORAL | Status: DC
  Administered 2013-04-12 – 2013-04-22 (×11): 20 meq via ORAL
  Filled 2013-04-12 (×11): qty 1

## 2013-04-12 MED ORDER — ALPRAZOLAM 0.25 MG PO TABS: 0.2500 mg | ORAL_TABLET | Freq: Every evening | ORAL | Status: DC | PRN

## 2013-04-12 MED ORDER — BISACODYL 5 MG PO TBEC: 10.0000 mg | DELAYED_RELEASE_TABLET | Freq: Every day | ORAL | Status: DC | PRN

## 2013-04-12 MED ORDER — BISACODYL 10 MG RE SUPP: 10.0000 mg | Freq: Every day | RECTAL | Status: DC | PRN

## 2013-04-12 MED ORDER — SODIUM CHLORIDE 0.9 % IJ SOLN: 3.0000 mL | INTRAMUSCULAR | Status: DC | PRN

## 2013-04-12 MED ORDER — MAGNESIUM HYDROXIDE 400 MG/5ML PO SUSP: 30.0000 mL | Freq: Every day | ORAL | Status: DC | PRN

## 2013-04-12 MED ORDER — SPIRONOLACTONE 12.5 MG HALF TABLET
12.5000 mg | ORAL_TABLET | Freq: Every day | ORAL | Status: DC
  Administered 2013-04-12 – 2013-04-13 (×2): 12.5 mg via ORAL
  Filled 2013-04-12 (×3): qty 1

## 2013-04-12 NOTE — Progress Notes (Signed)
Physical Therapy Treatment Patient Details Name: Brianna Hanson MRN: [id number] DOB: Mar 08, 1935 Today's Date: 04/12/2013 Time: 4098-1191 PT Time Calculation (min): 42 min  PT Assessment / Plan / Recommendation  History of Present Illness 78 year old female with heart failure due severe non-ischemic cardiomyopathy on milrinone and revatio scheduled for Heartmate II LVAD tomorrow for destination therapy   PT Comments   Patient able to perform some in hall ambulation today. Patient ambulated approximately 60 ft with rw but limited by fatigue and some dizziness.  Patient also performed multiple transfers from chair and bedside commode.  Worked with patient on transitioning to batter source and back to module. Patient still requires verbal cues and some assist for conversion. Patient with some limitations in dexterity impacting ability to convert, but with several attempts, patient was finally able to connect to battery source.  Patient may benefit from LVAD hand out for sequencing steps of conversion process. Will continue to work with patient and progress mobility as tolerated.  Patient will need a rollator walker, would be beneficial to have as soon as possible to begin training.  Follow Up Recommendations  Home health PT;Supervision/Assistance - 24 hour           Equipment Recommendations  Other (comment) (rollator walker)       Frequency Min 5X/week   Progress towards PT Goals Progress towards PT goals: Progressing toward goals  Plan Current plan remains appropriate    Precautions / Restrictions Precautions Precautions: Sternal;Fall Precaution Comments: educated patient on sternotomy incision and precautions to ensure healing   Pertinent Vitals/Pain No pain, O2 levels fluctuating with ambulation mid 80s on rm air, may benefit from O2 trial, HR stable    Mobility  Bed Mobility Bed Mobility: Not assessed Transfers Transfers: Sit to Stand;Stand to Sit Sit to Stand: 4: Min assist;From  chair/3-in-1 (performed x 3) Stand to Sit: 4: Min assist Details for Transfer Assistance: Patient with some instability during standing (posterior lean) Assist for stability Ambulation/Gait Ambulation/Gait Assistance: 4: Min assist Ambulation Distance (Feet): 60 Feet Assistive device: Rolling walker Ambulation/Gait Assistance Details: Assist for stability, VCs for pacing and posture Gait Pattern: Step-to pattern Gait velocity: decreased General Gait Details: limited by fatigue (ambulated on rm air, may benfit from ambulation with 02)    Exercises Other Exercises Other Exercises: LVAD conversion to power source x 2    PT Goals (current goals can now be found in the care plan section) Acute Rehab PT Goals Patient Stated Goal: to go home PT Goal Formulation: With patient Time For Goal Achievement: 04/22/13 Potential to Achieve Goals: Good  Visit Information  Last PT Received On: 04/12/13 Assistance Needed: +2 History of Present Illness: 78 year old female with heart failure due severe non-ischemic cardiomyopathy on milrinone and revatio scheduled for Heartmate II LVAD tomorrow for destination therapy    Subjective Data  Subjective: I don't really have much of an appetite Patient Stated Goal: to go home   Cognition  Cognition Arousal/Alertness: Awake/alert Behavior During Therapy: WFL for tasks assessed/performed Overall Cognitive Status: Within Functional Limits for tasks assessed    Balance  Static Sitting Balance Static Sitting - Balance Support: Feet supported Static Sitting - Level of Assistance: 5: Stand by assistance Static Standing Balance Static Standing - Balance Support: No upper extremity supported Static Standing - Level of Assistance: 4: Min assist (some instability noted with standing with posterior lean)  End of Session PT - End of Session Equipment Utilized During Treatment: Oxygen Activity Tolerance: Patient  tolerated treatment well;Patient limited by  fatigue Patient left: in chair;with call bell/phone within reach Nurse Communication: Mobility status   GP     Fabio Asa 04/12/2013, 3:30 PM Charlotte Crumb, PT DPT  [phone]

## 2013-04-12 NOTE — Progress Notes (Signed)
Occupational Therapy Treatment Patient Details Name: Brianna Hanson MRN: [id number] DOB: [birth year] Today's Date: 04/12/2013 Time: 4098-1191 OT Time Calculation (min): 37 min  OT Assessment / Plan / Recommendation  History of present illness 78 year old female with heart failure due severe non-ischemic cardiomyopathy on milrinone and revatio scheduled for Heartmate II LVAD tomorrow for destination therapy   OT comments  Pt improving with activity tolerance, but does fatigue.  She requires min A for standing balance due to dizziness and posterior lean.  She requires mod verbal cues and min A to switch to battery source and back to module, and requires increased time to do so.  Follow Up Recommendations  Home health OT    Barriers to Discharge       Equipment Recommendations  Other (comment);3 in 1 bedside comode    Recommendations for Other Services    Frequency Min 3X/week   Progress towards OT Goals Progress towards OT goals: Progressing toward goals  Plan      Precautions / Restrictions Precautions Precautions: Sternal;Fall Precaution Comments: educated patient on sternotomy incision and precautions to ensure healing   Pertinent Vitals/Pain     ADL  Toilet Transfer: +2 Total assistance Toilet Transfer: Patient Percentage: 70% Toilet Transfer Method: Sit to stand;Stand pivot Toilet Transfer Equipment: Bedside commode Toileting - Clothing Manipulation and Hygiene: Moderate assistance Where Assessed - Toileting Clothing Manipulation and Hygiene: Standing Transfers/Ambulation Related to ADLs: +2 total A (pt ~70%) ADL Comments: Pt able to switch from controller to battery with mod verbal cues and min A due to deficits with grip and FMC.   Reinforced sternal precautions    OT Diagnosis:    OT Problem List:   OT Treatment Interventions:     OT Goals(current goals can now be found in the care plan section) Acute Rehab OT Goals Patient Stated Goal: to go home ADL Goals Pt  Will Perform Grooming: with modified independence;standing Pt Will Perform Upper Body Bathing: with supervision;with set-up;with caregiver independent in assisting;sitting Pt Will Perform Lower Body Bathing: with set-up;with supervision;with caregiver independent in assisting;with adaptive equipment;sit to/from stand Pt Will Perform Upper Body Dressing: with supervision;with set-up;with caregiver independent in assisting;sitting Pt Will Transfer to Toilet: with supervision;ambulating;bedside commode Pt Will Perform Toileting - Clothing Manipulation and hygiene: with modified independence;sitting/lateral leans;sit to/from stand Additional ADL Goal #1: independent withsternal precautions during ADL and mobility for ADL  Visit Information  Last OT Received On: 04/12/13 Assistance Needed: +2 PT/OT Co-Evaluation/Treatment: Yes History of Present Illness: 78 year old female with heart failure due severe non-ischemic cardiomyopathy on milrinone and revatio scheduled for Heartmate II LVAD tomorrow for destination therapy    Subjective Data      Prior Functioning       Cognition  Cognition Arousal/Alertness: Awake/alert Behavior During Therapy: WFL for tasks assessed/performed Overall Cognitive Status: Within Functional Limits for tasks assessed    Mobility  Bed Mobility Bed Mobility: Not assessed Transfers Transfers: Sit to Stand;Stand to Sit Sit to Stand: 4: Min assist;From chair/3-in-1 (performed x 3) Stand to Sit: 4: Min assist Details for Transfer Assistance: Patient with some instability during standing (posterior lean) Assist for stability    Exercises  Other Exercises Other Exercises: LVAD conversion to power source x 2   Balance Static Sitting Balance Static Sitting - Balance Support: Feet supported Static Sitting - Level of Assistance: 5: Stand by assistance Static Standing Balance Static Standing - Balance Support: No upper extremity supported Static Standing - Level of  Assistance: 4:  Min assist (some instability noted with standing with posterior lean)   End of Session OT - End of Session Equipment Utilized During Treatment: Rolling walker;Other (comment) (LVAD equip) Activity Tolerance: Patient tolerated treatment well Patient left: in chair;with call bell/phone within reach;with nursing/sitter in room Nurse Communication: Mobility status;Precautions  GO     Jeani Hawking M 04/12/2013, 4:52 PM

## 2013-04-12 NOTE — Progress Notes (Signed)
Peripherally Inserted Central Catheter/Midline Placement  The IV Nurse has discussed with the patient and/or persons authorized to consent for the patient, the purpose of this procedure and the potential benefits and risks involved with this procedure.  The benefits include less needle sticks, lab draws from the catheter and patient may be discharged home with the catheter.  Risks include, but not limited to, infection, bleeding, blood clot (thrombus formation), and puncture of an artery; nerve damage and irregular heat beat.  Alternatives to this procedure were also discussed.  PICC/Midline Placement Documentation        Brianna Hanson 04/12/2013, 6:06 PM

## 2013-04-12 NOTE — Progress Notes (Signed)
Patient ID: Brianna Hanson, female   DOB: 04/12/1935, 78 y.o.   MRN: [id number] HeartMate 2 Rounding Note  Subjective:    78 y/o female with severe CHF due to NICM, PAH, chronic AF underwent HM II VAD implant 04/06/13  8/9 Echo looks good AoV not opening. Septum midline. RV mild to moderate dysfunction.   Milrinone turned off yesterday. Co-ox 69. Weight down 4 lbs. Speed turned up to 9000 yesterday. Tolerating well. No VT on tele.S Says she feels OK. Has not ambulated much yet. No BM yesterday. Pocket drain out.   MAP 90's INR 2.3 LDH 558  LVAD INTERROGATION:  HeartMate II LVAD:  Flow -- liters/min, speed 8980, power 4.6, PI 7.3   No PI events in last 24 hours   Objective:    Vital Signs:   Temp:  [98.1 F (36.7 C)-99.4 F (37.4 C)] 99.4 F (37.4 C) (08/11 1610) Pulse Rate:  [71-83] 80 (08/11 1620) Resp:  [18-29] 27 (08/11 1620) SpO2:  [92 %-97 %] 93 % (08/11 1620) Weight:  [75.5 kg (166 lb 7.2 oz)] 75.5 kg (166 lb 7.2 oz) (08/11 0700) Last BM Date: 04/10/13 Mean arterial Pressure 90s  Intake/Output:   Intake/Output Summary (Last 24 hours) at 04/12/13 1851 Last data filed at 04/12/13 1600  Gross per 24 hour  Intake    680 ml  Output   3475 ml  Net  -2795 ml     Physical Exam: General: Awake comfortable; sitting in chair HEENT: Normal Neck: supple. JVP flat. Carotids 2+ bilat; no bruits. No lymphadenopathy or thryomegaly appreciated. Cor: Mechanical heart sounds with LVAD hum present. Dressing c/d/i Lungs: Decreased at bases with mild crackles Abdomen: soft, nontender,  nondistended. + BS Driveline: C/D/I; securement device intact;  Extremities: no cyanosis, clubbing, rash, edema  Neuro: awake alert. Pleasant. nonfocal  Telemetry: sinus 70-80s  Labs: Basic Metabolic Panel:  Recent Labs Lab 04/08/13 0400 04/08/13 1400 04/09/13 0425 04/10/13 0400 04/11/13 0400 04/12/13 0400  NA 141  --  140 138 136 138  K 3.9 4.1 3.9 3.6 3.6 3.9  CL 110  --  105 103 101 100   CO2 23  --  29 28 29 30   GLUCOSE 87  --  96 97 98 84  BUN 8  --  10 10 9 8   CREATININE 0.85  --  0.91 0.82 0.86 0.74  CALCIUM 8.9  --  8.9 8.9 8.6 8.7  MG 2.3  --  2.1 2.1 1.8 1.8  PHOS 3.4  --  3.0 2.8 2.4 2.8    Liver Function Tests:  Recent Labs Lab 04/07/13 0408 04/07/13 0500 04/09/13 0425  AST 107*  --  80*  ALT 18  --  21  ALKPHOS  --   --  70  BILITOT  --  1.9* 0.8  PROT  --   --  5.8*  ALBUMIN  --   --  2.7*   No results found for this basename: LIPASE, AMYLASE,  in the last 168 hours No results found for this basename: AMMONIA,  in the last 168 hours  CBC:  Recent Labs Lab 04/08/13 0400 04/09/13 0425 04/10/13 0400 04/11/13 0400 04/12/13 0400  WBC 12.5* 13.6* 8.5 7.7 8.1  NEUTROABS 9.9* 10.8* 5.9 4.8 4.6  HGB 9.5* 9.6* 8.6* 8.8* 9.5*  HCT 28.3* 28.5* 26.0* 26.5* 27.9*  MCV 93.7 94.4 94.2 93.6 93.3  PLT 108* 125* 120* 135* 178    INR: 1.39  Recent Labs Lab 04/08/13 0400 04/09/13  0425 04/10/13 0400 04/11/13 0400 04/12/13 0400  INR 1.51* 1.44 2.00* 2.48* 2.39*   LDH 392  Coox 79.7% Arterial sat 99%  Imaging: Dg Chest 2 View  04/12/2013   *RADIOLOGY REPORT*  Clinical Data: LVAD  CHEST - 2 VIEW  Comparison: 04/12/2013 at 6:17 hours and 04/11/2013  Findings: Current radiograph is at the 16:57 hours.  Left ventricular assist device is present.  Left chest wall AICD appears stable. Stable left IJ central venous catheter with the distal tip in the proximal superior vena cava, directed laterally.  There are changes of median sternotomy.  Cardiomegaly is stable.  Pulmonary vascular congestion appears to be decreasing. Overall, aeration of the lungs is improving compared to a chest radiograph of 04/12/2013 at 6:17 hours. Pain left retrocardiac opacity persists. Small bilateral pleural effusions are present.  No evidence of pneumothorax.  Mild gaseous distention of visualized upper abdominal bowel loops without definite evidence of obstruction.  IMPRESSION:   1.  Decreasing pulmonary vascular congestion and improving aeration of the lungs compared to the chest radiograph performed earlier today. A persistent left basilar opacity likely reflect atelectasis. 2.  Small bilateral pleural effusions.   Original Report Authenticated By: Britta Mccreedy, M.D.   Dg Chest Port 1 View  04/12/2013   *RADIOLOGY REPORT*  Clinical Data: Ventricular assist device.  PORTABLE CHEST - 1 VIEW  Comparison: 04/11/2013  Findings: Again noted is a left ventricular assist device.  The patient has a left cardiac ICD and the lead positions appear unchanged.  Left jugular central line in the upper SVC region. Heart size remains enlarged.  Stable opacification at the left lung base.  Hazy densities in the left perihilar region may represent mild edema and/or atelectasis.  No evidence for a pneumothorax.  IMPRESSION: Minimal change since the previous examination.  Persistent opacification at the left lung base.  Hazy densities in the left mid lung may represent mild edema and/or atelectasis.  Stable support apparatuses.   Original Report Authenticated By: Richarda Overlie, M.D.   Dg Chest Port 1 View  04/11/2013   *RADIOLOGY REPORT*  Clinical Data: Follow up patient on ventricular assist device.  PORTABLE CHEST - 1 VIEW  Comparison: Chest radiograph performed 04/10/2013  Findings: The lungs remain hypoexpanded.  Retrocardiac airspace opacity may reflect atelectasis or possibly improving pulmonary edema, given the appearance on the prior study.  The small left pleural effusion is likely present.  Mild right basilar airspace opacities again seen.  No pneumothorax is identified.  The cardiomediastinal silhouette is enlarged.  The patient is status post median sternotomy.  [name] ventricular assist device is again noted, in unchanged position.  A pacemaker/AICD is noted overlying the left chest wall, with leads ending overlying the right atrium and right ventricle.  No acute osseous abnormalities are  seen.  IMPRESSION:  1.  Lungs remain hypoexpanded.  Interval improvement in pulmonary edema, with persistent retrocardiac and mild right basilar airspace opacities, likely reflecting atelectasis. 2.  Cardiomegaly noted.  Ventricular assist device is unchanged in appearance.   Original Report Authenticated By: Tonia Ghent, M.D.    CXR this am with bibasilar atelectasis and decreased edema  Medications:     Scheduled Medications: . amiodarone  200 mg Oral BID  . aspirin EC  325 mg Oral Daily  . bisacodyl  10 mg Oral Daily   Or  . bisacodyl  10 mg Rectal Daily  . carvedilol  3.125 mg Oral BID WC  . cholecalciferol  1,000 Units Oral Daily  .  digoxin  0.125 mg Oral Daily  . docusate sodium  200 mg Oral Daily  . feeding supplement  237 mL Oral BID BM  . ferrous fumarate-b12-vitamic C-folic acid  1 capsule Oral TID PC  . losartan  50 mg Oral Daily  . moving right along book   Does not apply Once  . pantoprazole  40 mg Oral Daily  . potassium chloride  20 mEq Oral Daily  . sildenafil  40 mg Oral TID  . sodium chloride  3 mL Intravenous Q12H  . spironolactone  12.5 mg Oral Daily  . torsemide  20 mg Oral Daily  . warfarin  2.5 mg Oral ONCE-1800  . Warfarin - Physician Dosing Inpatient   Does not apply q1800    Infusions: . sodium chloride 20 mL/hr at 04/09/13 1446  . sodium chloride 20 mL/hr at 04/07/13 0700  . sodium chloride      PRN Medications: sodium chloride, acetaminophen, ALPRAZolam, bisacodyl, bisacodyl, guaiFENesin, magnesium hydroxide, ondansetron (ZOFRAN) IV, sodium chloride, sodium chloride, sodium chloride, traMADol   Assessment:   1. Acute on chronic systolic HF due to NICM     --s/p HM II VAD on 04/06/13 2. PAH 3. Acute and chronic AF  - now in NSR 4. VT 5. Hypokalemia  Plan/Discussion:    POD # 6. Milrinone turned off yesterday and co-ox 69. Weight down 4 lbs and now below baseline weight. Will cut demadex back to 20mg  daily. Only 1 PI event with speed  increase. No VT.   Remains in SR on PO amiodarone. INR therapeutic 2.3. LDH trending back, up will continue to follow.  MAPs remain elevated in the 90s. Will start spiro 12.5 mg daily.   Patient is transferring to [facility] today. Continue to work with PT/OT and pulmonary toilet. Will get PICC placed before transfer and PA/Lateral CXR.   Consult dietician.  Stressed need for more ambulation. Continue education.  I reviewed the LVAD parameters from today, and compared the results to the patient's prior recorded data.  No programming changes were made.  The LVAD is functioning within specified parameters.  The patient performs LVAD self-test daily.  LVAD interrogation was negative for any significant power changes, alarms or PI events/speed drops.  LVAD equipment check completed and is in good working order.  Back-up equipment present.   LVAD education done on emergency procedures and precautions and reviewed exit site care.  The patient is critically ill with multiple organ systems failure and requires high complexity decision making for assessment and support, frequent evaluation and titration of therapies, application of advanced monitoring technologies and extensive interpretation of multiple databases.   Critical Care Time devoted to patient care services described in this note is 35 Minutes.   Length of Stay: 7  Holly Bodily 04/12/2013, 6:51 PM  VAD Team Pager [phone] (7am - 7am)

## 2013-04-12 NOTE — Progress Notes (Signed)
Patient ID: Brianna Hanson, female   DOB: 06/17/1935, 78 y.o.   MRN: [id number] HeartMate 2 Rounding Note  Subjective:    POD #6 VAD for nonischemic CM Converted to NSR on IV amio- slow rate now--,cont po dose and Lanoxin  0.125 daily OOB to chair, + BM Walked with PT 8 feet  pocket drain-removed 2D Echo reviewed 8-10- septum midline,Ao valve not opening, no TR , no effusion   RV with mil-mod dysfunction  VAD  rate at 8800 rpm  LVAD RPM to 9000 with flow>4.2 and no PI eventsCXR edema improved, still with atelectasis at left base --po demedex  Cont 20 BID   LVAD INTERROGATION:  HeartMate II LVAD:  Flow --- liters/min, speed 9000, power 4.2, PI 6.0.    Objective:    Vital Signs:   Temp:  [98.1 F (36.7 C)-99.1 F (37.3 C)] 98.1 F (36.7 C) (08/11 0400) Pulse Rate:  [63-79] 77 (08/11 0700) Resp:  [18-29] 23 (08/11 0700) SpO2:  [94 %-99 %] 97 % (08/11 0700) Last BM Date: 04/10/13 Mean arterial Pressure  CVP 11 MAP 92 Intake/Output:   Intake/Output Summary (Last 24 hours) at 04/12/13 0817 Last data filed at 04/12/13 0700  Gross per 24 hour  Intake  615.7 ml  Output   3475 ml  Net -2859.3 ml     Physical Exam: General:  Well appearing. No resp difficulty HEENT: normal Neck: supple.  Cor:  LVAD hum present. I don't hear valve sounds Lungs: decreased in bases Abdomen: soft, nontender, mildly distended. No hepatosplenomegaly. No bruits or masses. Good bowel sounds. Driveline: C/D/I; securement device intact and driveline incorporated Extremities: no cyanosis, moderate ankle edema Neuro: alert & orientedx3, cranial nerves grossly intact. moves all 4 extremities w/o difficulty. Affect pleasant  Telemetry: [id number]  Labs: Basic Metabolic Panel:  Recent Labs Lab 04/08/13 0400 04/08/13 1400 04/09/13 0425 04/10/13 0400 04/11/13 0400 04/12/13 0400  NA 141  --  140 138 136 138  K 3.9 4.1 3.9 3.6 3.6 3.9  CL 110  --  105 103 101 100  CO2 23  --  29 28 29 30   GLUCOSE 87   --  96 97 98 84  BUN 8  --  10 10 9 8   CREATININE 0.85  --  0.91 0.82 0.86 0.74  CALCIUM 8.9  --  8.9 8.9 8.6 8.7  MG 2.3  --  2.1 2.1 1.8 1.8  PHOS 3.4  --  3.0 2.8 2.4 2.8    Liver Function Tests:  Recent Labs Lab 04/05/13 1220 04/07/13 0408 04/07/13 0500 04/09/13 0425  AST 17 107*  --  80*  ALT 11 18  --  21  ALKPHOS 90  --   --  70  BILITOT 0.8  --  1.9* 0.8  PROT 7.3  --   --  5.8*  ALBUMIN 3.1*  --   --  2.7*   No results found for this basename: LIPASE, AMYLASE,  in the last 168 hours No results found for this basename: AMMONIA,  in the last 168 hours  CBC:  Recent Labs Lab 04/08/13 0400 04/09/13 0425 04/10/13 0400 04/11/13 0400 04/12/13 0400  WBC 12.5* 13.6* 8.5 7.7 8.1  NEUTROABS 9.9* 10.8* 5.9 4.8 4.6  HGB 9.5* 9.6* 8.6* 8.8* 9.5*  HCT 28.3* 28.5* 26.0* 26.5* 27.9*  MCV 93.7 94.4 94.2 93.6 93.3  PLT 108* 125* 120* 135* 178    INR:2.47  Recent Labs Lab 04/08/13 0400 04/09/13 0425 04/10/13 0400 04/11/13  0400 04/12/13 0400  INR 1.51* 1.44 2.00* 2.48* 2.39*   LDH 392 <547 <550 Coox 71%  Pulse ox sat 96%  Other results:  EKG:   Imaging: Dg Chest Port 1 View  04/12/2013   *RADIOLOGY REPORT*  Clinical Data: Ventricular assist device.  PORTABLE CHEST - 1 VIEW  Comparison: 04/11/2013  Findings: Again noted is a left ventricular assist device.  The patient has a left cardiac ICD and the lead positions appear unchanged.  Left jugular central line in the upper SVC region. Heart size remains enlarged.  Stable opacification at the left lung base.  Hazy densities in the left perihilar region may represent mild edema and/or atelectasis.  No evidence for a pneumothorax.  IMPRESSION: Minimal change since the previous examination.  Persistent opacification at the left lung base.  Hazy densities in the left mid lung may represent mild edema and/or atelectasis.  Stable support apparatuses.   Original Report Authenticated By: Richarda Overlie, M.D.   Dg Chest Port 1  View  04/11/2013   *RADIOLOGY REPORT*  Clinical Data: Follow up patient on ventricular assist device.  PORTABLE CHEST - 1 VIEW  Comparison: Chest radiograph performed 04/10/2013  Findings: The lungs remain hypoexpanded.  Retrocardiac airspace opacity may reflect atelectasis or possibly improving pulmonary edema, given the appearance on the prior study.  The small left pleural effusion is likely present.  Mild right basilar airspace opacities again seen.  No pneumothorax is identified.  The cardiomediastinal silhouette is enlarged.  The patient is status post median sternotomy.  [name] ventricular assist device is again noted, in unchanged position.  A pacemaker/AICD is noted overlying the left chest wall, with leads ending overlying the right atrium and right ventricle.  No acute osseous abnormalities are seen.  IMPRESSION:  1.  Lungs remain hypoexpanded.  Interval improvement in pulmonary edema, with persistent retrocardiac and mild right basilar airspace opacities, likely reflecting atelectasis. 2.  Cardiomegaly noted.  Ventricular assist device is unchanged in appearance.   Original Report Authenticated By: Tonia Ghent, M.D.     Medications:     Scheduled Medications: . amiodarone  200 mg Oral BID  . aspirin EC  325 mg Oral Daily  . bisacodyl  10 mg Oral Daily   Or  . bisacodyl  10 mg Rectal Daily  . carvedilol  3.125 mg Oral BID WC  . cholecalciferol  1,000 Units Oral Daily  . digoxin  0.125 mg Oral Daily  . docusate sodium  200 mg Oral Daily  . feeding supplement  237 mL Oral BID BM  . ferrous fumarate-b12-vitamic C-folic acid  1 capsule Oral TID PC  . losartan  50 mg Oral Daily  . pantoprazole  40 mg Oral Daily  . sildenafil  40 mg Oral TID  . torsemide  20 mg Oral BID AC  . warfarin  2.5 mg Oral ONCE-1800  . Warfarin - Physician Dosing Inpatient   Does not apply q1800    Infusions: . sodium chloride 20 mL/hr at 04/09/13 1446  . sodium chloride 20 mL/hr at 04/07/13 0700   . sodium chloride      PRN Medications: acetaminophen, ALPRAZolam, ondansetron (ZOFRAN) IV, sodium chloride, traMADol   Assessment:  She is doing well. Needs to start walking with PT  Plan/Discussion:    . Transfer to stepdown- cont teaching and PT  Milrinone weaned off and CO-OX remains 70%  INR  2.4. Will give 2.5 mg coumadin today--- her preop daily dose Will place PICC and remove  IJ line- no periph access.  Continue IS and mobilization. Needs to start walking with physical therapy--NSR with batteries   I reviewed the LVAD parameters from today, and compared the results to the patient's prior recorded data.  No programming changes were made.  The LVAD is functioning within specified parameters.  The patient performs LVAD self-test daily.  LVAD interrogation was negative for any significant power changes, alarms or PI events/speed drops.  LVAD equipment check completed and is in good working order.  Back-up equipment present.   LVAD education done on emergency procedures and precautions and reviewed exit site care.  Length of Stay: 7  VAN TRIGT III,PETER 04/12/2013, 8:17 AM  VAD Team Pager [phone] (7am - 7am)

## 2013-04-12 NOTE — Progress Notes (Signed)
NUTRITION FOLLOW UP/CONSULT  Intervention:   Continue Ensure Complete po BID, each supplement provides 350 kcal and 13 grams of protein. Reviewed menu with patient, discussed ordering system and suggested ways to increase PO variety. Recommend diet liberalization to Regular to maximize oral intake. RD to continue to follow nutrition care plan.  Nutrition Dx:   Inadequate oral intake now r/t variable appetite AEB pt report.  Goal:   Intake to meet >90% of estimated nutrition needs. Improving.  Monitor:   weight trends, lab trends, I/O's, PO intake, supplement tolerance  Assessment:   Admitted for LVAD implantation. Pt with left ventricular EF of 15% 2/2 severe non-ischemic cardiomyopathy.   Underwent the following procedures 8/5:  INSERTION OF IMPLANTABLE LEFT VENTRICULAR ASSIST DEVICE  PLACEMENT OF CENTRIMAG VENTRICULAR ASSIST DEVICE  INTRAOPERATIVE TRANSESOPHAGEAL ECHOCARDIOGRAM   RD consulted for poor PO intake. Advanced to Carbohydrate Modified Medium diet on 8/9. Pt reports that her oral intake is slowly improving, she complains of early satiety. Pt is drinking 2 Ensure Completes daily, encouraged pt to continue this.   Height: Ht Readings from Last 1 Encounters:  04/05/13 4' 11.84" (1.52 m)    Weight Status:   Wt Readings from Last 1 Encounters:  04/12/13 166 lb 7.2 oz (75.5 kg)  Admit wt 170 lb - stable.   Re-estimated needs:  Kcal: 1700 - 1900 Protein: 70 - 85 g Fluid: 1.7 - 1.9 liters daily  Skin:  Chest incision  Diet Order: Carbohydrate Modified Medium   Intake/Output Summary (Last 24 hours) at 04/12/13 0936 Last data filed at 04/12/13 0900  Gross per 24 hour  Intake    823 ml  Output   3175 ml  Net  -2352 ml    Last BM: 8/9   Labs:   Recent Labs Lab 04/10/13 0400 04/11/13 0400 04/12/13 0400  NA 138 136 138  K 3.6 3.6 3.9  CL 103 101 100  CO2 28 29 30   BUN 10 9 8   CREATININE 0.82 0.86 0.74  CALCIUM 8.9 8.6 8.7  MG 2.1 1.8 1.8  PHOS  2.8 2.4 2.8  GLUCOSE 97 98 84    CBG (last 3)   Recent Labs  04/11/13 1145 04/11/13 1548 04/12/13 0759  GLUCAP 103* 98 90    Scheduled Meds: . amiodarone  200 mg Oral BID  . aspirin EC  325 mg Oral Daily  . bisacodyl  10 mg Oral Daily   Or  . bisacodyl  10 mg Rectal Daily  . carvedilol  3.125 mg Oral BID WC  . cholecalciferol  1,000 Units Oral Daily  . digoxin  0.125 mg Oral Daily  . docusate sodium  200 mg Oral Daily  . feeding supplement  237 mL Oral BID BM  . ferrous fumarate-b12-vitamic C-folic acid  1 capsule Oral TID PC  . losartan  50 mg Oral Daily  . pantoprazole  40 mg Oral Daily  . sildenafil  40 mg Oral TID  . spironolactone  12.5 mg Oral Daily  . torsemide  20 mg Oral Daily  . warfarin  2.5 mg Oral ONCE-1800  . Warfarin - Physician Dosing Inpatient   Does not apply q1800    Continuous Infusions: . sodium chloride 20 mL/hr at 04/09/13 1446  . sodium chloride 20 mL/hr at 04/07/13 0700  . sodium chloride      Jarold Motto MS, RD, LDN Pager: [phone] After-hours pager: [phone]

## 2013-04-13 DIAGNOSIS — I10 Essential (primary) hypertension: Secondary | ICD-10-CM

## 2013-04-13 DIAGNOSIS — R040 Epistaxis: Secondary | ICD-10-CM

## 2013-04-13 LAB — BASIC METABOLIC PANEL
BUN: 10 mg/dL (ref 6–23)
CO2: 31 mEq/L (ref 19–32)
Calcium: 8.8 mg/dL (ref 8.4–10.5)
Chloride: 97 mEq/L (ref 96–112)
Creatinine, Ser: 0.8 mg/dL (ref 0.50–1.10)
GFR calc Af Amer: 81 mL/min — ABNORMAL LOW (ref >90)
GFR calc non Af Amer: 70 mL/min — ABNORMAL LOW (ref >90)
Glucose, Bld: 85 mg/dL (ref 70–99)
Potassium: 3.5 mEq/L (ref 3.5–5.1)
Sodium: 137 mEq/L (ref 135–145)

## 2013-04-13 LAB — PRO B NATRIURETIC PEPTIDE: Pro B Natriuretic peptide (BNP): 4254 pg/mL — ABNORMAL HIGH (ref 0–450)

## 2013-04-13 LAB — PROTIME-INR
INR: 2.45 — ABNORMAL HIGH (ref 0.00–1.49)
Prothrombin Time: 25.8 seconds — ABNORMAL HIGH (ref 11.6–15.2)

## 2013-04-13 LAB — LACTATE DEHYDROGENASE: LDH: 419 U/L — ABNORMAL HIGH (ref 94–250)

## 2013-04-13 LAB — GLUCOSE, CAPILLARY: Glucose-Capillary: 117 mg/dL — ABNORMAL HIGH (ref 70–99)

## 2013-04-13 MED ORDER — HYDRALAZINE HCL 20 MG/ML IJ SOLN
10.0000 mg | Freq: Once | INTRAMUSCULAR | Status: AC
Start: 2013-04-13 — End: 2013-04-13
  Administered 2013-04-13: 10 mg via INTRAVENOUS
  Filled 2013-04-13: qty 0.5

## 2013-04-13 MED ORDER — LOSARTAN POTASSIUM 50 MG PO TABS
100.0000 mg | ORAL_TABLET | Freq: Every day | ORAL | Status: DC
  Administered 2013-04-13 – 2013-04-22 (×10): 100 mg via ORAL
  Filled 2013-04-13 (×11): qty 2

## 2013-04-13 MED ORDER — POTASSIUM CHLORIDE 10 MEQ/50ML IV SOLN
10.0000 meq | INTRAVENOUS | Status: AC
  Filled 2013-04-13 (×2): qty 50

## 2013-04-13 MED ORDER — WARFARIN SODIUM 2.5 MG PO TABS
2.5000 mg | ORAL_TABLET | Freq: Once | ORAL | Status: AC
  Administered 2013-04-13: 2.5 mg via ORAL
  Filled 2013-04-13: qty 1

## 2013-04-13 MED ORDER — POTASSIUM CHLORIDE 10 MEQ/50ML IV SOLN
10.0000 meq | INTRAVENOUS | Status: AC
  Administered 2013-04-13 (×2): 10 meq via INTRAVENOUS
  Filled 2013-04-13 (×2): qty 50

## 2013-04-13 MED ORDER — OXYMETAZOLINE HCL 0.05 % NA SOLN
1.0000 | Freq: Two times a day (BID) | NASAL | Status: DC
  Administered 2013-04-13 – 2013-04-21 (×5): 1 via NASAL
  Filled 2013-04-13 (×2): qty 15

## 2013-04-13 MED ORDER — WARFARIN - PHARMACIST DOSING INPATIENT
Freq: Every day | Status: DC
  Administered 2013-04-15 – 2013-04-18 (×4)

## 2013-04-13 NOTE — Progress Notes (Signed)
CSW met briefly with patient in ICU. She appeared somewhat sleepy but recognized CSW and stated that she was "doing ok." Currently denies any needs. Nursing in room providing care. CSW will follow up next week.  Lorri Frederick. [place]  [phone]

## 2013-04-13 NOTE — Progress Notes (Signed)
Occupational Therapy Treatment Patient Details Name: Brianna Hanson MRN: [id number] DOB: 03-Feb-1935 Today's Date: 04/13/2013 Time: 4098-1191 OT Time Calculation (min): 24 min  OT Assessment / Plan / Recommendation  History of present illness 77 year old female with heart failure due severe non-ischemic cardiomyopathy on milrinone and revatio scheduled for Heartmate II LVAD tomorrow for destination therapy   OT comments  Had nose bleed earlier and therefore tired this pm, however, making steady progress. Will give pt squeeze ball and make handouts to follow for conversion. On target to D/C home with [facility].  Follow Up Recommendations  Home health OT    Barriers to Discharge       Equipment Recommendations  Other (comment);3 in 1 bedside comode    Recommendations for Other Services    Frequency Min 3X/week   Progress towards OT Goals Progress towards OT goals: Progressing toward goals  Plan Discharge plan remains appropriate    Precautions / Restrictions Precautions Precautions: Sternal;Fall Precaution Comments: educated patient on sternotomy incision and precautions to ensure healing (educated sister on sternotomy precutions)   Pertinent Vitals/Pain no apparent distress     ADL  Toilet Transfer: Minimal assistance Toilet Transfer Method: Sit to Barista: Bedside commode Toileting - Clothing Manipulation and Hygiene: Set up Where Assessed - Engineer, mining and Hygiene: Sit to stand from 3-in-1 or toilet Equipment Used: Rolling walker;Other (comment) (LVAD equipment) Transfers/Ambulation Related to ADLs: minguard ADL Comments: Pt practicing switching from power to battery system    OT Diagnosis:    OT Problem List:   OT Treatment Interventions:     OT Goals(current goals can now be found in the care plan section) Acute Rehab OT Goals Patient Stated Goal: to go home OT Goal Formulation: With patient Time For Goal Achievement:  04/22/13 Potential to Achieve Goals: Good ADL Goals Pt Will Perform Grooming: with modified independence;standing Pt Will Perform Upper Body Bathing: with supervision;with set-up;with caregiver independent in assisting;sitting Pt Will Perform Lower Body Bathing: with set-up;with supervision;with caregiver independent in assisting;with adaptive equipment;sit to/from stand Pt Will Perform Upper Body Dressing: with supervision;with set-up;with caregiver independent in assisting;sitting Pt Will Transfer to Toilet: with supervision;ambulating;bedside commode Pt Will Perform Toileting - Clothing Manipulation and hygiene: with modified independence;sitting/lateral leans;sit to/from stand Additional ADL Goal #1: independent withsternal precautions during ADL and mobility for ADL  Visit Information  Last OT Received On: 04/13/13 Assistance Needed: +1 History of Present Illness: 78 year old female with heart failure due severe non-ischemic cardiomyopathy on milrinone and revatio scheduled for Heartmate II LVAD tomorrow for destination therapy    Subjective Data      Prior Functioning       Cognition  Cognition Arousal/Alertness: Awake/alert Behavior During Therapy: WFL for tasks assessed/performed Overall Cognitive Status: Within Functional Limits for tasks assessed    Mobility  Bed Mobility Bed Mobility: Supine to Sit;Sitting - Scoot to [place] of Bed Rolling Left: 4: Min assist Transfers Transfers: Sit to Stand;Stand to Sit Sit to Stand: 4: Min guard;From bed Stand to Sit: 4: Min guard;To chair/3-in-1 Details for Transfer Assistance: good carry over of sternotomy precautions    Exercises  Other Exercises Other Exercises: squeeze ball    Balance  WFL   End of Session OT - End of Session Equipment Utilized During Treatment: Other (comment) (rollator, LVAD equipment) Activity Tolerance: Patient tolerated treatment well Patient left: in chair;with call bell/phone within reach;with  family/visitor present Nurse Communication: Mobility status  GO     Khailee Mick,HILLARY  04/13/2013, 2:14 PM [facility], OTR/L  [phone] 04/13/2013

## 2013-04-13 NOTE — Progress Notes (Signed)
Pt had Left sided nosebleed. Pressure held and ice applied, bleeding increased. MD made aware. MD to come to bedside and to call ENT. Will continue to monitor.

## 2013-04-13 NOTE — Progress Notes (Signed)
Patient ID: Brianna Hanson, female   DOB: 1935/06/15, 78 y.o.   MRN: [id number] HeartMate 2 Rounding Note  Subjective:    78 y/o female with severe CHF due to NICM, PAH, chronic AF underwent HM II VAD implant 04/06/13  8/9 Echo looks good AoV not opening. Septum midline. RV mild to moderate dysfunction.   Off Milrinone. Moved to 2W. Walked 60 feet yesterday. Was weak. This am with recurrent epistaxis. Weight down another pound.   MAP 90-100's INR 2.4 LDH 558-> 419  LVAD INTERROGATION:  HeartMate II LVAD:  Flow -- liters/min, speed 8980, power 4.6, PI 7.2   No PI events in last 24 hours   Objective:    Vital Signs:   Temp:  [97.8 F (36.6 C)-99.4 F (37.4 C)] 97.8 F (36.6 C) (08/12 0415) Pulse Rate:  [72-83] 76 (08/12 0620) Resp:  [16-29] 16 (08/12 0415) SpO2:  [93 %-100 %] 98 % (08/12 0620) Weight:  [75.2 kg (165 lb 12.6 oz)] 75.2 kg (165 lb 12.6 oz) (08/12 0325) Last BM Date: 04/11/13 Mean arterial Pressure 90s  Intake/Output:   Intake/Output Summary (Last 24 hours) at 04/13/13 0858 Last data filed at 04/13/13 0325  Gross per 24 hour  Intake    400 ml  Output   1050 ml  Net   -650 ml     Physical Exam: General: Awake comfortable; sitting in chair HEENT: Normal Neck: supple. JVP flat. Carotids 2+ bilat; no bruits. No lymphadenopathy or thryomegaly appreciated. Cor: Mechanical heart sounds with LVAD hum present. Dressing c/d/i Lungs: Decreased at bases with mild crackles Abdomen: soft, nontender,  nondistended. + BS Driveline: C/D/I; securement device intact;  Extremities: no cyanosis, clubbing, rash, edema  Neuro: awake alert. Pleasant. nonfocal  Telemetry: sinus 70-80s  Labs: Basic Metabolic Panel:  Recent Labs Lab 04/08/13 0400  04/09/13 0425 04/10/13 0400 04/11/13 0400 04/12/13 0400 04/13/13 0525  NA 141  --  140 138 136 138 137  K 3.9  < > 3.9 3.6 3.6 3.9 3.5  CL 110  --  105 103 101 100 97  CO2 23  --  29 28 29 30 31   GLUCOSE 87  --  96 97 98 84 85   BUN 8  --  10 10 9 8 10   CREATININE 0.85  --  0.91 0.82 0.86 0.74 0.80  CALCIUM 8.9  --  8.9 8.9 8.6 8.7 8.8  MG 2.3  --  2.1 2.1 1.8 1.8  --   PHOS 3.4  --  3.0 2.8 2.4 2.8  --   < > = values in this interval not displayed.  Liver Function Tests:  Recent Labs Lab 04/07/13 0408 04/07/13 0500 04/09/13 0425  AST 107*  --  80*  ALT 18  --  21  ALKPHOS  --   --  70  BILITOT  --  1.9* 0.8  PROT  --   --  5.8*  ALBUMIN  --   --  2.7*   No results found for this basename: LIPASE, AMYLASE,  in the last 168 hours No results found for this basename: AMMONIA,  in the last 168 hours  CBC:  Recent Labs Lab 04/08/13 0400 04/09/13 0425 04/10/13 0400 04/11/13 0400 04/12/13 0400  WBC 12.5* 13.6* 8.5 7.7 8.1  NEUTROABS 9.9* 10.8* 5.9 4.8 4.6  HGB 9.5* 9.6* 8.6* 8.8* 9.5*  HCT 28.3* 28.5* 26.0* 26.5* 27.9*  MCV 93.7 94.4 94.2 93.6 93.3  PLT 108* 125* 120* 135* 178  INR: 1.39  Recent Labs Lab 04/09/13 0425 04/10/13 0400 04/11/13 0400 04/12/13 0400 04/13/13 0525  INR 1.44 2.00* 2.48* 2.39* 2.45*   LDH 392  Coox 79.7% Arterial sat 99%  Imaging: Dg Chest 2 View  04/12/2013   *RADIOLOGY REPORT*  Clinical Data: LVAD  CHEST - 2 VIEW  Comparison: 04/12/2013 at 6:17 hours and 04/11/2013  Findings: Current radiograph is at the 16:57 hours.  Left ventricular assist device is present.  Left chest wall AICD appears stable. Stable left IJ central venous catheter with the distal tip in the proximal superior vena cava, directed laterally.  There are changes of median sternotomy.  Cardiomegaly is stable.  Pulmonary vascular congestion appears to be decreasing. Overall, aeration of the lungs is improving compared to a chest radiograph of 04/12/2013 at 6:17 hours. Pain left retrocardiac opacity persists. Small bilateral pleural effusions are present.  No evidence of pneumothorax.  Mild gaseous distention of visualized upper abdominal bowel loops without definite evidence of obstruction.   IMPRESSION:  1.  Decreasing pulmonary vascular congestion and improving aeration of the lungs compared to the chest radiograph performed earlier today. A persistent left basilar opacity likely reflect atelectasis. 2.  Small bilateral pleural effusions.   Original Report Authenticated By: Britta Mccreedy, M.D.   Dg Chest Port 1 View  04/12/2013   *RADIOLOGY REPORT*  Clinical Data: Ventricular assist device.  PORTABLE CHEST - 1 VIEW  Comparison: 04/11/2013  Findings: Again noted is a left ventricular assist device.  The patient has a left cardiac ICD and the lead positions appear unchanged.  Left jugular central line in the upper SVC region. Heart size remains enlarged.  Stable opacification at the left lung base.  Hazy densities in the left perihilar region may represent mild edema and/or atelectasis.  No evidence for a pneumothorax.  IMPRESSION: Minimal change since the previous examination.  Persistent opacification at the left lung base.  Hazy densities in the left mid lung may represent mild edema and/or atelectasis.  Stable support apparatuses.   Original Report Authenticated By: Richarda Overlie, M.D.    CXR this am with bibasilar atelectasis and decreased edema  Medications:     Scheduled Medications: . amiodarone  200 mg Oral BID  . aspirin EC  325 mg Oral Daily  . bisacodyl  10 mg Oral Daily   Or  . bisacodyl  10 mg Rectal Daily  . carvedilol  3.125 mg Oral BID WC  . cholecalciferol  1,000 Units Oral Daily  . digoxin  0.125 mg Oral Daily  . docusate sodium  200 mg Oral Daily  . feeding supplement  237 mL Oral BID BM  . ferrous fumarate-b12-vitamic C-folic acid  1 capsule Oral TID PC  . losartan  50 mg Oral Daily  . moving right along book   Does not apply Once  . oxymetazoline  1 spray Each Nare BID  . pantoprazole  40 mg Oral Daily  . potassium chloride  10 mEq Intravenous Q1 Hr x 2  . potassium chloride  20 mEq Oral Daily  . sildenafil  40 mg Oral TID  . sodium chloride  3 mL  Intravenous Q12H  . spironolactone  12.5 mg Oral Daily  . torsemide  20 mg Oral Daily    Infusions: . sodium chloride 20 mL/hr at 04/09/13 1446  . sodium chloride 20 mL/hr at 04/07/13 0700  . sodium chloride      PRN Medications: sodium chloride, acetaminophen, ALPRAZolam, bisacodyl, bisacodyl, guaiFENesin, magnesium hydroxide, ondansetron (ZOFRAN)  IV, sodium chloride, sodium chloride, sodium chloride, traMADol   Assessment:   1. Acute on chronic systolic HF due to NICM     --s/p HM II VAD on 04/06/13 2. PAH 3. Acute and chronic AF  - now in NSR 4. VT 5. Hypokalemia 6. Epistaxis   Plan/Discussion:    POD # 7 . Doing very well. Diuresing slowly. Tolerating 9000 RPMS well. Maintaining SR on amio. MAP elevated will increase losartan to 100. Need to continue aggressive efforts for ambulation.   ENT contacted for epistaxis. I placed gauze and held pressure personally for 15 minutes.   D/w Dr. Donata Clay   I reviewed the LVAD parameters from today, and compared the results to the patient's prior recorded data.  No programming changes were made.  The LVAD is functioning within specified parameters.  The patient performs LVAD self-test daily.  LVAD interrogation was negative for any significant power changes, alarms or PI events/speed drops.  LVAD equipment check completed and is in good working order.  Back-up equipment present.   LVAD education done on emergency procedures and precautions and reviewed exit site care.  Length of Stay: 8  Holly Bodily 04/13/2013, 8:58 AM  VAD Team Pager [phone] (7am - 7am)

## 2013-04-13 NOTE — Progress Notes (Signed)
ANTICOAGULATION CONSULT NOTE - Initial Consult  Pharmacy Consult for Warfarin Indication: atrial fibrillation  No Known Allergies  Patient Measurements: Height: 4' 11.84" (152 cm) Weight: 165 lb 12.6 oz (75.2 kg) IBW/kg (Calculated) : 45.14  Vital Signs: Temp: 98 F (36.7 C) (08/12 1417) Temp src: Oral (08/12 0415) Pulse Rate: 83 (08/12 1026)  Labs:  Recent Labs  04/11/13 0400 04/12/13 0400 04/13/13 0525  HGB 8.8* 9.5*  --   HCT 26.5* 27.9*  --   PLT 135* 178  --   LABPROT 26.0* 25.3* 25.8*  INR 2.48* 2.39* 2.45*  CREATININE 0.86 0.74 0.80    Estimated Creatinine Clearance: 53.9 ml/min (by C-G formula based on Cr of 0.8).   Medical History: Past Medical History  Diagnosis Date  . Cardiac arrest - ventricular fibrillation 12/10    with successful resucitation, S/p ICD  . ICD (implantable cardiac defibrillator) in place     she has received appropriate therapy for VF  . Atrial fibrillation or flutter   . Osteopenia   . HTN (hypertension)     moderate  . Nonischemic cardiomyopathy     followed by Dr Glori Luis at [facility]  . CHF (congestive heart failure)   . Anemia   . S/P colonoscopy   . Plasma cell disorder 03/20/2012  . Plasma cell disorder 03/20/2012    Medications:  Prescriptions prior to admission  Medication Sig Dispense Refill  . amiodarone (PACERONE) 200 MG tablet Take 200 mg by mouth daily.      . Cholecalciferol (VITAMIN D3) 1000 UNITS CAPS Take 1 tablet by mouth daily.        . furosemide (LASIX) 80 MG tablet Take 80 mg by mouth 2 (two) times daily.      [name] losartan (COZAAR) 50 MG tablet Take 50 mg by mouth daily.      . milrinone (PRIMACOR) 20 MG/100ML SOLN infusion Inject 28.7625 mcg/min into the vein continuous.  100 mL  6  . potassium chloride SA (K-DUR,KLOR-CON) 20 MEQ tablet Take 20 mEq by mouth 2 (two) times daily.      . sildenafil (REVATIO) 20 MG tablet Take 60 mg by mouth 3 (three) times daily.      [name] warfarin (COUMADIN) 2.5 MG tablet Take  2.5 mg by mouth daily.        Assessment: 78 yo F with PMH of Afib and HF d/t severe NICM who presented for LVAD placement on 04/06/13. On warfarin PTA at 2.5 mg po daily. Warfarin was restarted on 8/6 (MD dosing) with BL INR of 1.44. Pharmacy consulted today for follow up dosing.   Patient had an episode of epistaxis this morning which lasted >15 mins but was stabilized with cauterization by ENT (pt states she has a h/o epistaxis on warfarin). She has no other s/s of bleeding.  INR today therapeutic at 2.45. H/H (9.5/27.9) low but trending up since surgery, also has h/o chronic anemia.  Goal of Therapy:  INR 2-3 Monitor platelets by anticoagulation protocol: Yes   Plan:  - Warfarin 2.5 mg po x 1 - Monitor INR, s/s bleeding, clinical status daily   Margie Billet, PharmD Clinical Pharmacist - Resident Pager: [phone] Pharmacy: [phone] 04/13/2013 2:34 PM

## 2013-04-13 NOTE — Progress Notes (Signed)
1425 PT working with pt now. We will follow up tomorrow. Luetta Nutting RNBSN

## 2013-04-13 NOTE — Progress Notes (Signed)
CSW met with patient today for brief visit. She was sitting up in a chair and was visiting with her sister.  Patient related that she is feeling much better and that she was "so glad" that she decided to proceed with VAD implantation.  She appears to be in good spirits and denies any current concerns or issues. Patient would like to complete a Health Care Power of Attorney; CSW will plan to meet with patient tomorrow to complete document.  Will continue supportive visits. Lorri Frederick. [place]  [phone]

## 2013-04-13 NOTE — Progress Notes (Signed)
Driveline dressing change completed. Site had some sanguinous drainage on old dressing. Otherwise nothing remarkable around driveline site. Pt tolerated well.

## 2013-04-13 NOTE — Progress Notes (Signed)
Patient ID: Brianna Hanson, female   DOB: 08-22-1935, 78 y.o.   MRN: [id number] HeartMate 2 Rounding Note  Subjective:    POD #7 VAD for nonischemic CM Converted to NSR on IV amio- slow rate now--,cont po dose and Lanoxin  0.125 daily OOB to chair, + BM Walked with PT 40 feet  Surgical incisions all healing well including drive line, EPW's in place 2D Echo reviewed 8-10- septum midline,Ao valve not opening, no TR , no effusion   RV with mil-mod dysfunction  VAD  rate at 8800 rpm  LVAD RPM to 9000 with flow>4.2 and no PI eventsCXR edema improved, still with atelectasis at left base --po demedex   Nosebleed this morning from left side treated with silver nitrate cauterization by ENT. INR today therapeutic 2.45 No PI events overnight Mean arterial pressure slightly elevated we'll adjust ARB dose  LVAD INTERROGATION:  HeartMate II LVAD:  Flow --- liters/min, speed 9000, power 4.2, PI 6.0.    Objective:    Vital Signs:   Temp:  [97.8 F (36.6 C)-99.4 F (37.4 C)] 97.8 F (36.6 C) (08/12 0415) Pulse Rate:  [72-83] 83 (08/12 1026) Resp:  [16-29] 16 (08/12 0415) SpO2:  [93 %-100 %] 98 % (08/12 0620) Weight:  [165 lb 12.6 oz (75.2 kg)] 165 lb 12.6 oz (75.2 kg) (08/12 0325) Last BM Date: 04/11/13 Mean arterial Pressure   MAP 92 Intake/Output:   Intake/Output Summary (Last 24 hours) at 04/13/13 1056 Last data filed at 04/13/13 0800  Gross per 24 hour  Intake    360 ml  Output   1050 ml  Net   -690 ml     Physical Exam: General:  Well appearing. No resp difficulty HEENT: normal Neck: supple.  Cor:  LVAD hum present. I don't hear valve sounds Lungs: decreased in bases Abdomen: soft, nontender, mildly distended. No hepatosplenomegaly. No bruits or masses. Good bowel sounds. Driveline: C/D/I; securement device intact and driveline incorporated Extremities: no cyanosis, mild  ankle edema Neuro: alert & orientedx3, cranial nerves grossly intact. moves all 4 extremities w/o  difficulty. Affect pleasant  Telemetry: sinus72  Labs: Basic Metabolic Panel:  Recent Labs Lab 04/08/13 0400  04/09/13 0425 04/10/13 0400 04/11/13 0400 04/12/13 0400 04/13/13 0525  NA 141  --  140 138 136 138 137  K 3.9  < > 3.9 3.6 3.6 3.9 3.5  CL 110  --  105 103 101 100 97  CO2 23  --  29 28 29 30 31   GLUCOSE 87  --  96 97 98 84 85  BUN 8  --  10 10 9 8 10   CREATININE 0.85  --  0.91 0.82 0.86 0.74 0.80  CALCIUM 8.9  --  8.9 8.9 8.6 8.7 8.8  MG 2.3  --  2.1 2.1 1.8 1.8  --   PHOS 3.4  --  3.0 2.8 2.4 2.8  --   < > = values in this interval not displayed.  Liver Function Tests:  Recent Labs Lab 04/07/13 0408 04/07/13 0500 04/09/13 0425  AST 107*  --  80*  ALT 18  --  21  ALKPHOS  --   --  70  BILITOT  --  1.9* 0.8  PROT  --   --  5.8*  ALBUMIN  --   --  2.7*   No results found for this basename: LIPASE, AMYLASE,  in the last 168 hours No results found for this basename: AMMONIA,  in the last 168 hours  CBC:  Recent Labs Lab 04/08/13 0400 04/09/13 0425 04/10/13 0400 04/11/13 0400 04/12/13 0400  WBC 12.5* 13.6* 8.5 7.7 8.1  NEUTROABS 9.9* 10.8* 5.9 4.8 4.6  HGB 9.5* 9.6* 8.6* 8.8* 9.5*  HCT 28.3* 28.5* 26.0* 26.5* 27.9*  MCV 93.7 94.4 94.2 93.6 93.3  PLT 108* 125* 120* 135* 178    INR:2.47  Recent Labs Lab 04/09/13 0425 04/10/13 0400 04/11/13 0400 04/12/13 0400 04/13/13 0525  INR 1.44 2.00* 2.48* 2.39* 2.45*   LDH 392 <547 <550 Coox 71%  Pulse ox sat 96%  Other results:  EKG:   Imaging: Dg Chest 2 View  04/12/2013   *RADIOLOGY REPORT*  Clinical Data: LVAD  CHEST - 2 VIEW  Comparison: 04/12/2013 at 6:17 hours and 04/11/2013  Findings: Current radiograph is at the 16:57 hours.  Left ventricular assist device is present.  Left chest wall AICD appears stable. Stable left IJ central venous catheter with the distal tip in the proximal superior vena cava, directed laterally.  There are changes of median sternotomy.  Cardiomegaly is stable.   Pulmonary vascular congestion appears to be decreasing. Overall, aeration of the lungs is improving compared to a chest radiograph of 04/12/2013 at 6:17 hours. Pain left retrocardiac opacity persists. Small bilateral pleural effusions are present.  No evidence of pneumothorax.  Mild gaseous distention of visualized upper abdominal bowel loops without definite evidence of obstruction.  IMPRESSION:  1.  Decreasing pulmonary vascular congestion and improving aeration of the lungs compared to the chest radiograph performed earlier today. A persistent left basilar opacity likely reflect atelectasis. 2.  Small bilateral pleural effusions.   Original Report Authenticated By: Britta Mccreedy, M.D.   Dg Chest Port 1 View  04/12/2013   *RADIOLOGY REPORT*  Clinical Data: Ventricular assist device.  PORTABLE CHEST - 1 VIEW  Comparison: 04/11/2013  Findings: Again noted is a left ventricular assist device.  The patient has a left cardiac ICD and the lead positions appear unchanged.  Left jugular central line in the upper SVC region. Heart size remains enlarged.  Stable opacification at the left lung base.  Hazy densities in the left perihilar region may represent mild edema and/or atelectasis.  No evidence for a pneumothorax.  IMPRESSION: Minimal change since the previous examination.  Persistent opacification at the left lung base.  Hazy densities in the left mid lung may represent mild edema and/or atelectasis.  Stable support apparatuses.   Original Report Authenticated By: Richarda Overlie, M.D.     Medications:     Scheduled Medications: . amiodarone  200 mg Oral BID  . aspirin EC  325 mg Oral Daily  . bisacodyl  10 mg Oral Daily   Or  . bisacodyl  10 mg Rectal Daily  . carvedilol  3.125 mg Oral BID WC  . cholecalciferol  1,000 Units Oral Daily  . digoxin  0.125 mg Oral Daily  . docusate sodium  200 mg Oral Daily  . feeding supplement  237 mL Oral BID BM  . ferrous fumarate-b12-vitamic C-folic acid  1 capsule  Oral TID PC  . losartan  100 mg Oral Daily  . moving right along book   Does not apply Once  . oxymetazoline  1 spray Each Nare BID  . pantoprazole  40 mg Oral Daily  . potassium chloride  10 mEq Intravenous Q1 Hr x 2  . potassium chloride  20 mEq Oral Daily  . sildenafil  40 mg Oral TID  . sodium chloride  3 mL Intravenous Q12H  .  spironolactone  12.5 mg Oral Daily  . torsemide  20 mg Oral Daily    Infusions: . sodium chloride 20 mL/hr at 04/09/13 1446  . sodium chloride 20 mL/hr at 04/07/13 0700  . sodium chloride      PRN Medications: sodium chloride, acetaminophen, ALPRAZolam, bisacodyl, bisacodyl, guaiFENesin, magnesium hydroxide, ondansetron (ZOFRAN) IV, sodium chloride, sodium chloride, sodium chloride, traMADol   Assessment:  She is doing well. Needs to start walking with PT  Plan/Discussion:    . Transferred  to stepdown- cont teaching and PT  Milrinone weaned off and CO-OX remains 70% PICC line placed for IV access until discharge home    Continue IS and mobilization. Needs to start walking with physical therapy- with batteries, last walk was 40 feet. Overall she is doing extremely well at one week postop. Goals to be met prior to discharge include equipment education, wound care education, and improved ambulation intervals.    I reviewed the LVAD parameters from today, and compared the results to the patient's prior recorded data.  No programming changes were made.  The LVAD is functioning within specified parameters.  The patient performs LVAD self-test daily.  LVAD interrogation was negative for any significant power changes, alarms or PI events/speed drops.  LVAD equipment check completed and is in good working order.  Back-up equipment present.   LVAD education done on emergency procedures and precautions and reviewed exit site care.  Length of Stay: 8  VAN TRIGT III,PETER 04/13/2013, 10:56 AM  VAD Team Pager [phone] (7am - 7am)

## 2013-04-13 NOTE — Consult Note (Signed)
Reason for Consult: Epistaxis Referring Physician: Cardiology  Brianna Hanson is an 78 y.o. female.  HPI: 78 year old who's had a history of epistaxis but not had any bleeding in approximately 2 years. [place] cautery procedures for epistaxis. She has been on Coumadin for a long time and just started bleeding today. It was bleeding exclusively from the left side. A gauze was placed into the nose and it did  slow the bleeding to some degree. Most all of the bleeding was from the anterior aspect of the nose and not down the back. He has had no nasal obstruction. No nasal trauma. No pain  Past Medical History  Diagnosis Date  . Cardiac arrest - ventricular fibrillation 12/10    with successful resucitation, S/p ICD  . ICD (implantable cardiac defibrillator) in place     she has received appropriate therapy for VF  . Atrial fibrillation or flutter   . Osteopenia   . HTN (hypertension)     moderate  . Nonischemic cardiomyopathy     followed by Dr Glori Luis at [facility]  . CHF (congestive heart failure)   . Anemia   . S/P colonoscopy   . Plasma cell disorder 03/20/2012  . Plasma cell disorder 03/20/2012    Past Surgical History  Procedure Laterality Date  . Abdominal hysterectomy    . Cardiac catheterization    . Cardiac defibrillator placement      by JA for secondary prevention of sudden death  . Insertion of implantable left ventricular assist device N/A 04/06/2013    Procedure: INSERTION OF IMPLANTABLE LEFT VENTRICULAR ASSIST DEVICE;  Surgeon: Alleen Borne, MD;  Location: [facility] OR;  Service: Open Heart Surgery;  Laterality: N/A;  . Placement of centrimag ventricular assist device Right 04/06/2013    Procedure: PLACEMENT OF CENTRIMAG VENTRICULAR ASSIST DEVICE;  Surgeon: Alleen Borne, MD;  Location: [facility] OR;  Service: Open Heart Surgery;  Laterality: Right;  . Intraoperative transesophageal echocardiogram N/A 04/06/2013    Procedure: INTRAOPERATIVE  TRANSESOPHAGEAL ECHOCARDIOGRAM;  Surgeon: Alleen Borne, MD;  Location: [facility] OR;  Service: Open Heart Surgery;  Laterality: N/A;    Family History  Problem Relation Age of Onset  . Stroke Brother   . Stroke Mother     Social History:  reports that she has quit smoking. She has never used smokeless tobacco. She reports that she does not drink alcohol or use illicit drugs.  Allergies: No Known Allergies  Medications: I have reviewed the patient's current medications.  Results for orders placed during the hospital encounter of 04/05/13 (from the past 48 hour(s))  GLUCOSE, CAPILLARY     Status: Abnormal   Collection Time    04/11/13 11:45 AM      Result Value Range   Glucose-Capillary 103 (*) 70 - 99 mg/dL   Comment 1 Notify RN    GLUCOSE, CAPILLARY     Status: None   Collection Time    04/11/13  3:48 PM      Result Value Range   Glucose-Capillary 98  70 - 99 mg/dL  BASIC METABOLIC PANEL     Status: Abnormal   Collection Time    04/12/13  4:00 AM      Result Value Range   Sodium 138  135 - 145 mEq/L   Potassium 3.9  3.5 - 5.1 mEq/L   Chloride 100  96 - 112 mEq/L   CO2 30  19 - 32 mEq/L   Glucose,  Bld 84  70 - 99 mg/dL   BUN 8  6 - 23 mg/dL   Creatinine, Ser 6.96  0.50 - 1.10 mg/dL   Calcium 8.7  8.4 - 29.5 mg/dL   GFR calc non Af Amer 81 (*) >90 mL/min   GFR calc Af Amer >90  >90 mL/min   Comment:            The eGFR has been calculated     using the CKD EPI equation.     This calculation has not been     validated in all clinical     situations.     eGFR's persistently     <90 mL/min signify     possible Chronic Kidney Disease.  CBC WITH DIFFERENTIAL     Status: Abnormal   Collection Time    04/12/13  4:00 AM      Result Value Range   WBC 8.1  4.0 - 10.5 K/uL   RBC 2.99 (*) 3.87 - 5.11 MIL/uL   Hemoglobin 9.5 (*) 12.0 - 15.0 g/dL   HCT 28.4 (*) 13.2 - 44.0 %   MCV 93.3  78.0 - 100.0 fL   MCH 31.8  26.0 - 34.0 pg   MCHC 34.1  30.0 - 36.0 g/dL   RDW 10.2 (*)  72.5 - 15.5 %   Platelets 178  150 - 400 K/uL   Comment: DELTA CHECK NOTED   Neutrophils Relative % 57  43 - 77 %   Neutro Abs 4.6  1.7 - 7.7 K/uL   Lymphocytes Relative 16  12 - 46 %   Lymphs Abs 1.3  0.7 - 4.0 K/uL   Monocytes Relative 20 (*) 3 - 12 %   Monocytes Absolute 1.6 (*) 0.1 - 1.0 K/uL   Eosinophils Relative 7 (*) 0 - 5 %   Eosinophils Absolute 0.5  0.0 - 0.7 K/uL   Basophils Relative 0  0 - 1 %   Basophils Absolute 0.0  0.0 - 0.1 K/uL  MAGNESIUM     Status: None   Collection Time    04/12/13  4:00 AM      Result Value Range   Magnesium 1.8  1.5 - 2.5 mg/dL  PHOSPHORUS     Status: None   Collection Time    04/12/13  4:00 AM      Result Value Range   Phosphorus 2.8  2.3 - 4.6 mg/dL  LACTATE DEHYDROGENASE     Status: Abnormal   Collection Time    04/12/13  4:00 AM      Result Value Range   LDH 558 (*) 94 - 250 U/L   Comment: HEMOLYSIS AT THIS LEVEL MAY AFFECT RESULT  PROTIME-INR     Status: Abnormal   Collection Time    04/12/13  4:00 AM      Result Value Range   Prothrombin Time 25.3 (*) 11.6 - 15.2 seconds   INR 2.39 (*) 0.00 - 1.49  CARBOXYHEMOGLOBIN     Status: Abnormal   Collection Time    04/12/13  4:05 AM      Result Value Range   Total hemoglobin 9.4 (*) 12.0 - 16.0 g/dL   O2 Saturation 36.6     Carboxyhemoglobin 2.1 (*) 0.5 - 1.5 %   Methemoglobin 0.7  0.0 - 1.5 %  GLUCOSE, CAPILLARY     Status: None   Collection Time    04/12/13  7:59 AM      Result Value Range  Glucose-Capillary 90  70 - 99 mg/dL  GLUCOSE, CAPILLARY     Status: Abnormal   Collection Time    04/12/13 12:11 PM      Result Value Range   Glucose-Capillary 120 (*) 70 - 99 mg/dL  PRO B NATRIURETIC PEPTIDE     Status: Abnormal   Collection Time    04/13/13 12:20 AM      Result Value Range   Pro B Natriuretic peptide (BNP) 4254.0 (*) 0 - 450 pg/mL  BASIC METABOLIC PANEL     Status: Abnormal   Collection Time    04/13/13  5:25 AM      Result Value Range   Sodium 137  135 - 145  mEq/L   Potassium 3.5  3.5 - 5.1 mEq/L   Chloride 97  96 - 112 mEq/L   CO2 31  19 - 32 mEq/L   Glucose, Bld 85  70 - 99 mg/dL   BUN 10  6 - 23 mg/dL   Creatinine, Ser 1.61  0.50 - 1.10 mg/dL   Calcium 8.8  8.4 - 09.6 mg/dL   GFR calc non Af Amer 70 (*) >90 mL/min   GFR calc Af Amer 81 (*) >90 mL/min   Comment:            The eGFR has been calculated     using the CKD EPI equation.     This calculation has not been     validated in all clinical     situations.     eGFR's persistently     <90 mL/min signify     possible Chronic Kidney Disease.  LACTATE DEHYDROGENASE     Status: Abnormal   Collection Time    04/13/13  5:25 AM      Result Value Range   LDH 419 (*) 94 - 250 U/L  PROTIME-INR     Status: Abnormal   Collection Time    04/13/13  5:25 AM      Result Value Range   Prothrombin Time 25.8 (*) 11.6 - 15.2 seconds   INR 2.45 (*) 0.00 - 1.49    Dg Chest 2 View  04/12/2013   *RADIOLOGY REPORT*  Clinical Data: LVAD  CHEST - 2 VIEW  Comparison: 04/12/2013 at 6:17 hours and 04/11/2013  Findings: Current radiograph is at the 16:57 hours.  Left ventricular assist device is present.  Left chest wall AICD appears stable. Stable left IJ central venous catheter with the distal tip in the proximal superior vena cava, directed laterally.  There are changes of median sternotomy.  Cardiomegaly is stable.  Pulmonary vascular congestion appears to be decreasing. Overall, aeration of the lungs is improving compared to a chest radiograph of 04/12/2013 at 6:17 hours. Pain left retrocardiac opacity persists. Small bilateral pleural effusions are present.  No evidence of pneumothorax.  Mild gaseous distention of visualized upper abdominal bowel loops without definite evidence of obstruction.  IMPRESSION:  1.  Decreasing pulmonary vascular congestion and improving aeration of the lungs compared to the chest radiograph performed earlier today. A persistent left basilar opacity likely reflect atelectasis.  2.  Small bilateral pleural effusions.   Original Report Authenticated By: Britta Mccreedy, M.D.   Dg Chest Port 1 View  04/12/2013   *RADIOLOGY REPORT*  Clinical Data: Ventricular assist device.  PORTABLE CHEST - 1 VIEW  Comparison: 04/11/2013  Findings: Again noted is a left ventricular assist device.  The patient has a left cardiac ICD and the lead positions appear unchanged.  Left jugular central line in the upper SVC region. Heart size remains enlarged.  Stable opacification at the left lung base.  Hazy densities in the left perihilar region may represent mild edema and/or atelectasis.  No evidence for a pneumothorax.  IMPRESSION: Minimal change since the previous examination.  Persistent opacification at the left lung base.  Hazy densities in the left mid lung may represent mild edema and/or atelectasis.  Stable support apparatuses.   Original Report Authenticated By: Richarda Overlie, M.D.    ROS Blood pressure 121/80, pulse 76, temperature 97.8 F (36.6 C), temperature source Oral, resp. rate 16, height 4' 11.84" (1.52 m), weight 75.2 kg (165 lb 12.6 oz), SpO2 98.00%. Physical Exam  Constitutional: She appears well-developed.  HENT:  Mouth/Throat: Oropharynx is clear and moist.  Left side has a pack in place of gauze. This was removed an Afrin/lidocaine pledgets placed. There was several areas of bleeding on the anterior septum that were cauterized with silver nitrate. There was also one spot it appeared on the anterior inferior inferior turbinate that was cauterized as well. Additional pledgets were placed and there was good hemostasis. No evidence of any mass or lesion. Because there was good control no packing was placed.  Eyes: Pupils are equal, round, and reactive to light.  Neck: Normal range of motion. Neck supple.    Assessment/Plan: Epistaxes-the left side was cauterized with silver nitrate. This seemed to give good hemostasis. A pack was avoided secondary to her Coumadin and the need to  remove the pack later. She will use saline twice a day. She should have O2 humidified. Call if there's any further bleeding. Suzanna Obey 04/13/2013, 9:16 AM

## 2013-04-13 NOTE — Progress Notes (Signed)
Patient underwent LVAD implantation today.  CSW Psychosocial assessment for LVAD process was completed last month. CSW will provide support and visits to patient during hospitalization.  Lorri Frederick. [place]  [phone]

## 2013-04-13 NOTE — Progress Notes (Signed)
Physical Therapy Treatment Patient Details Name: Brianna Hanson MRN: [id number] DOB: 12-24-1934 Today's Date: 04/13/2013 Time: 6578-4696 PT Time Calculation (min): 37 min  PT Assessment / Plan / Recommendation  History of Present Illness 78 year old female with heart failure due severe non-ischemic cardiomyopathy on milrinone and revatio scheduled for Heartmate II LVAD tomorrow for destination therapy   PT Comments   Patient with overall improvements in mobility today despite this morning's events. Patient able to ambulate in hall increased distance on rm air with one rest break using rollator. Patient still requires cues for management of LVAD equipment but demonstrates some improvements in this area as well. Will continue to work with patient and progress activity as tolerated.   Follow Up Recommendations  Home health PT;Supervision/Assistance - 24 hour     Does the patient have the potential to tolerate intense rehabilitation     Barriers to Discharge        Equipment Recommendations  Other (comment) (rollator walker)    Recommendations for Other Services    Frequency Min 5X/week   Progress towards PT Goals Progress towards PT goals: Progressing toward goals  Plan Current plan remains appropriate    Precautions / Restrictions Precautions Precautions: Sternal;Fall Precaution Comments: educated patient on sternotomy incision and precautions to ensure healing (educated sister on sternotomy precutions)   Pertinent Vitals/Pain No pain at this time    Mobility  Bed Mobility Bed Mobility: Supine to Sit;Sitting - Scoot to [place] of Bed Rolling Left: 4: Min assist Transfers Transfers: Sit to Stand;Stand to Sit Sit to Stand: 4: Min guard;From bed Stand to Sit: 4: Min guard;To chair/3-in-1 Details for Transfer Assistance: good carry over of sternotomy precautions Ambulation/Gait Ambulation/Gait Assistance: 5: Supervision Ambulation Distance (Feet): 160 Feet Assistive device:  Rolling walker Gait Pattern: Step-to pattern Gait velocity: decreased General Gait Details: improved stability and activity tolerance today    Exercises Other Exercises Other Exercises: squeeze ball      PT Goals (current goals can now be found in the care plan section) Acute Rehab PT Goals Patient Stated Goal: to go home PT Goal Formulation: With patient Time For Goal Achievement: 04/22/13 Potential to Achieve Goals: Good  Visit Information  Last PT Received On: 04/13/13 Assistance Needed: +1 History of Present Illness: 78 year old female with heart failure due severe non-ischemic cardiomyopathy on milrinone and revatio scheduled for Heartmate II LVAD tomorrow for destination therapy    Subjective Data  Subjective: My noseblled really took it out of me Patient Stated Goal: to go home   Cognition  Cognition Arousal/Alertness: Awake/alert Behavior During Therapy: WFL for tasks assessed/performed Overall Cognitive Status: Within Functional Limits for tasks assessed    Balance  Static Sitting Balance Static Sitting - Balance Support: Feet supported Static Sitting - Level of Assistance: 7: Independent Static Standing Balance Static Standing - Balance Support: No upper extremity supported Static Standing - Level of Assistance: 5: Stand by assistance  End of Session PT - End of Session Activity Tolerance: Patient tolerated treatment well;Patient limited by fatigue Patient left: in chair;with call bell/phone within reach Nurse Communication: Mobility status   GP     Fabio Asa 04/13/2013, 4:13 PM Charlotte Crumb, PT DPT  [phone]

## 2013-04-14 DIAGNOSIS — I2589 Other forms of chronic ischemic heart disease: Secondary | ICD-10-CM

## 2013-04-14 LAB — PROTIME-INR
INR: 2.47 — ABNORMAL HIGH (ref 0.00–1.49)
Prothrombin Time: 25.9 seconds — ABNORMAL HIGH (ref 11.6–15.2)

## 2013-04-14 LAB — BASIC METABOLIC PANEL
BUN: 10 mg/dL (ref 6–23)
CO2: 29 mEq/L (ref 19–32)
Calcium: 9 mg/dL (ref 8.4–10.5)
Chloride: 99 mEq/L (ref 96–112)
Creatinine, Ser: 0.88 mg/dL (ref 0.50–1.10)
GFR calc Af Amer: 72 mL/min — ABNORMAL LOW (ref >90)
GFR calc non Af Amer: 62 mL/min — ABNORMAL LOW (ref >90)
Glucose, Bld: 86 mg/dL (ref 70–99)
Potassium: 3.6 mEq/L (ref 3.5–5.1)
Sodium: 137 mEq/L (ref 135–145)

## 2013-04-14 LAB — LACTATE DEHYDROGENASE: LDH: 429 U/L — ABNORMAL HIGH (ref 94–250)

## 2013-04-14 LAB — CBC
HCT: 27.2 % — ABNORMAL LOW (ref 36.0–46.0)
Hemoglobin: 9.1 g/dL — ABNORMAL LOW (ref 12.0–15.0)
MCH: 31.1 pg (ref 26.0–34.0)
MCHC: 33.5 g/dL (ref 30.0–36.0)
MCV: 92.8 fL (ref 78.0–100.0)
Platelets: 236 10*3/uL (ref 150–400)
RBC: 2.93 MIL/uL — ABNORMAL LOW (ref 3.87–5.11)
RDW: 17.2 % — ABNORMAL HIGH (ref 11.5–15.5)
WBC: 10.5 10*3/uL (ref 4.0–10.5)

## 2013-04-14 MED ORDER — SPIRONOLACTONE 25 MG PO TABS
25.0000 mg | ORAL_TABLET | Freq: Every day | ORAL | Status: DC
  Administered 2013-04-14 – 2013-04-22 (×9): 25 mg via ORAL
  Filled 2013-04-14 (×9): qty 1

## 2013-04-14 MED ORDER — MAGNESIUM SULFATE 40 MG/ML IJ SOLN
2.0000 g | Freq: Once | INTRAMUSCULAR | Status: AC
  Administered 2013-04-14: 2 g via INTRAVENOUS
  Filled 2013-04-14: qty 50

## 2013-04-14 MED ORDER — WHITE PETROLATUM GEL
Status: DC | PRN
  Administered 2013-04-14: 0.2 via TOPICAL
  Filled 2013-04-14: qty 28.35

## 2013-04-14 MED ORDER — HYDRALAZINE HCL 20 MG/ML IJ SOLN
10.0000 mg | INTRAMUSCULAR | Status: DC | PRN
  Administered 2013-04-15 – 2013-04-22 (×4): 10 mg via INTRAVENOUS
  Filled 2013-04-14 (×4): qty 1

## 2013-04-14 MED ORDER — HYDRALAZINE HCL 25 MG PO TABS
12.5000 mg | ORAL_TABLET | Freq: Three times a day (TID) | ORAL | Status: DC
  Administered 2013-04-14 – 2013-04-16 (×4): 12.5 mg via ORAL
  Filled 2013-04-14 (×9): qty 0.5

## 2013-04-14 MED ORDER — WARFARIN SODIUM 2.5 MG PO TABS
2.5000 mg | ORAL_TABLET | Freq: Once | ORAL | Status: AC
  Administered 2013-04-14: 2.5 mg via ORAL
  Filled 2013-04-14: qty 1

## 2013-04-14 MED ORDER — WHITE PETROLATUM GEL
Status: DC | PRN
  Filled 2013-04-14: qty 5

## 2013-04-14 MED ORDER — POTASSIUM CHLORIDE 10 MEQ/50ML IV SOLN
10.0000 meq | INTRAVENOUS | Status: AC
  Administered 2013-04-14 (×2): 10 meq via INTRAVENOUS
  Filled 2013-04-14 (×2): qty 50

## 2013-04-14 NOTE — Progress Notes (Signed)
Occupational Therapy Treatment Patient Details Name: Brianna Hanson MRN: [id number] DOB: 1935-07-15 Today's Date: 04/14/2013 Time: 9528-4132 OT Time Calculation (min): 25 min  OT Assessment / Plan / Recommendation  History of present illness 78 year old female with heart failure due severe non-ischemic cardiomyopathy on milrinone and revatio scheduled for Heartmate II LVAD tomorrow for destination therapy   OT comments  Discussed need for pt to begin having family learn how to assist her at home with ADL and moiblity and management of LVAD equipment. Pt to have family bring in clothing and will begin education. Will educate onAE for ADL and modify plugs and make print out for pt to increase success with switching power sources.   Follow Up Recommendations  Home health OT    Barriers to Discharge       Equipment Recommendations  Other (comment);3 in 1 bedside comode    Recommendations for Other Services    Frequency Min 3X/week   Progress towards OT Goals Progress towards OT goals: Progressing toward goals  Plan Discharge plan remains appropriate    Precautions / Restrictions Precautions Precautions: Sternal;Fall Restrictions Weight Bearing Restrictions:  (sternal precautions)   Pertinent Vitals/Pain no apparent distress     ADL  Grooming: Set up;Supervision/safety Where Assessed - Grooming: Supported sitting Upper Body Bathing: Minimal assistance Where Assessed - Upper Body Bathing: Supported sitting Lower Body Bathing: Maximal assistance Where Assessed - Lower Body Bathing: Supported sit to stand Upper Body Dressing: Moderate assistance Where Assessed - Upper Body Dressing: Unsupported sitting Lower Body Dressing: Maximal assistance Where Assessed - Lower Body Dressing: Supported sit to stand Toilet Transfer: Supervision/safety Statistician Method: Other (comment) (ambulaintg) Toilet Transfer Equipment: Comfort height toilet Toileting - Clothing Manipulation and  Hygiene: Supervision/safety Where Assessed - Toileting Clothing Manipulation and Hygiene: Sit to stand from 3-in-1 or toilet Equipment Used: Rolling walker;Other (comment) (LVAD equipment) Transfers/Ambulation Related to ADLs: S. Max vc to manage LVAd equipment and lines ADL Comments: Focus of second session switching to battery system. Required physical assist to manipulate controls due to pinch/grasp weakness. Also had difficulty correctly lining up plugs. Mod vc for correct sequence. Assessed with use of "houlder bag".     OT Diagnosis:    OT Problem List:   OT Treatment Interventions:     OT Goals(current goals can now be found in the care plan section) Acute Rehab OT Goals Patient Stated Goal: to go home OT Goal Formulation: With patient Time For Goal Achievement: 04/22/13 Potential to Achieve Goals: Good ADL Goals Pt Will Perform Grooming: with modified independence;standing Pt Will Perform Upper Body Bathing: with supervision;with set-up;with caregiver independent in assisting;sitting Pt Will Perform Lower Body Bathing: with set-up;with supervision;with caregiver independent in assisting;with adaptive equipment;sit to/from stand Pt Will Perform Upper Body Dressing: with supervision;with set-up;with caregiver independent in assisting;sitting Pt Will Transfer to Toilet: with supervision;ambulating;bedside commode Pt Will Perform Toileting - Clothing Manipulation and hygiene: with modified independence;sitting/lateral leans;sit to/from stand Additional ADL Goal #1: independent withsternal precautions during ADL and mobility for ADL  Visit Information  Last OT Received On: 04/14/13 Assistance Needed: +1 History of Present Illness: 78 year old female with heart failure due severe non-ischemic cardiomyopathy on milrinone and revatio scheduled for Heartmate II LVAD tomorrow for destination therapy    Subjective Data      Prior Functioning       Cognition   Cognition Arousal/Alertness: Awake/alert Behavior During Therapy: WFL for tasks assessed/performed Overall Cognitive Status: Within Functional Limits for tasks assessed  Mobility  Transfers Transfers: Sit to Stand;Stand to Sit Sit to Stand: 5: Supervision;From bed Stand to Sit: 5: Supervision;To chair/3-in-1 (vcfor sternal prexcautions)    Exercises      Balance     End of Session OT - End of Session Activity Tolerance: Patient limited by fatigue Patient left: in chair;with call bell/phone within reach;with nursing/sitter in room Nurse Communication: Mobility status (giving pt more responsiblity )  GO     Kemyra August,HILLARY 04/14/2013, 11:21 AM Luisa Dago, OTR/L  [phone] 04/14/2013

## 2013-04-14 NOTE — Progress Notes (Signed)
Patient ID: Brianna Hanson, female   DOB: [birth year], 78 y.o.   MRN: [id number] HeartMate 2 Rounding Note  Subjective:    78 y/o female with severe CHF due to NICM, PAH, chronic AF underwent HM II VAD implant 04/06/13  8/9 Echo looks good AoV not opening. Septum midline. RV mild to moderate dysfunction.   Yesterday had epistaxis and L nare cauterized by ENT. Losartan increased for high MAPs. Weight down to baseline (160). Renal function stable. Walking more with PT.  MAP 74-95  INR 2.4 LDH 558-> 419 -> 429  LVAD INTERROGATION:  HeartMate II LVAD:  Flow 4.5 liters/min, speed 9000, power 5.0, PI 7.1  No PI events in last 24 hours   Objective:    Vital Signs:   Temp:  [98 F (36.7 C)-99.3 F (37.4 C)] 99.1 F (37.3 C) (08/13 0354) Pulse Rate:  [74-83] 74 (08/13 0354) Resp:  [20] 20 (08/13 0354) SpO2:  [92 %-93 %] 92 % (08/13 0354) Weight:  [73 kg (160 lb 15 oz)] 73 kg (160 lb 15 oz) (08/13 0354) Last BM Date: 04/12/13 Mean arterial Pressure 90s  Intake/Output:   Intake/Output Summary (Last 24 hours) at 04/14/13 0736 Last data filed at 04/13/13 1700  Gross per 24 hour  Intake    600 ml  Output    200 ml  Net    400 ml     Physical Exam: General: Awake comfortable; sitting in chair HEENT: Normal Neck: supple. JVP flat. Carotids 2+ bilat; no bruits. No lymphadenopathy or thryomegaly appreciated. Cor: Mechanical heart sounds with LVAD hum present. Dressing c/d/i Lungs: Decreased at bases with mild crackles Abdomen: soft, nontender,  nondistended. + BS Driveline: C/D/I; securement device intact;  Extremities: no cyanosis, clubbing, rash, edema  Neuro: awake alert. Pleasant. nonfocal  Telemetry: sinus 70-80s  Labs: Basic Metabolic Panel:  Recent Labs Lab 04/08/13 0400  04/09/13 0425 04/10/13 0400 04/11/13 0400 04/12/13 0400 04/13/13 0525 04/14/13 0545  NA 141  --  140 138 136 138 137 137  K 3.9  < > 3.9 3.6 3.6 3.9 3.5 3.6  CL 110  --  105 103 101 100 97 99  CO2  23  --  29 28 29 30 31 29   GLUCOSE 87  --  96 97 98 84 85 86  BUN 8  --  10 10 9 8 10 10   CREATININE 0.85  --  0.91 0.82 0.86 0.74 0.80 0.88  CALCIUM 8.9  --  8.9 8.9 8.6 8.7 8.8 9.0  MG 2.3  --  2.1 2.1 1.8 1.8  --   --   PHOS 3.4  --  3.0 2.8 2.4 2.8  --   --   < > = values in this interval not displayed.  Liver Function Tests:  Recent Labs Lab 04/09/13 0425  AST 80*  ALT 21  ALKPHOS 70  BILITOT 0.8  PROT 5.8*  ALBUMIN 2.7*   No results found for this basename: LIPASE, AMYLASE,  in the last 168 hours No results found for this basename: AMMONIA,  in the last 168 hours  CBC:  Recent Labs Lab 04/08/13 0400 04/09/13 0425 04/10/13 0400 04/11/13 0400 04/12/13 0400 04/14/13 0545  WBC 12.5* 13.6* 8.5 7.7 8.1 10.5  NEUTROABS 9.9* 10.8* 5.9 4.8 4.6  --   HGB 9.5* 9.6* 8.6* 8.8* 9.5* 9.1*  HCT 28.3* 28.5* 26.0* 26.5* 27.9* 27.2*  MCV 93.7 94.4 94.2 93.6 93.3 92.8  PLT 108* 125* 120* 135* 178 236  INR: 1.39  Recent Labs Lab 04/10/13 0400 04/11/13 0400 04/12/13 0400 04/13/13 0525 04/14/13 0545  INR 2.00* 2.48* 2.39* 2.45* 2.47*   Imaging: Dg Chest 2 View  04/12/2013   *RADIOLOGY REPORT*  Clinical Data: LVAD  CHEST - 2 VIEW  Comparison: 04/12/2013 at 6:17 hours and 04/11/2013  Findings: Current radiograph is at the 16:57 hours.  Left ventricular assist device is present.  Left chest wall AICD appears stable. Stable left IJ central venous catheter with the distal tip in the proximal superior vena cava, directed laterally.  There are changes of median sternotomy.  Cardiomegaly is stable.  Pulmonary vascular congestion appears to be decreasing. Overall, aeration of the lungs is improving compared to a chest radiograph of 04/12/2013 at 6:17 hours. Pain left retrocardiac opacity persists. Small bilateral pleural effusions are present.  No evidence of pneumothorax.  Mild gaseous distention of visualized upper abdominal bowel loops without definite evidence of obstruction.   IMPRESSION:  1.  Decreasing pulmonary vascular congestion and improving aeration of the lungs compared to the chest radiograph performed earlier today. A persistent left basilar opacity likely reflect atelectasis. 2.  Small bilateral pleural effusions.   Original Report Authenticated By: Britta Mccreedy, M.D.    Medications:     Scheduled Medications: . amiodarone  200 mg Oral BID  . aspirin EC  325 mg Oral Daily  . bisacodyl  10 mg Oral Daily   Or  . bisacodyl  10 mg Rectal Daily  . carvedilol  3.125 mg Oral BID WC  . cholecalciferol  1,000 Units Oral Daily  . digoxin  0.125 mg Oral Daily  . docusate sodium  200 mg Oral Daily  . feeding supplement  237 mL Oral BID BM  . ferrous fumarate-b12-vitamic C-folic acid  1 capsule Oral TID PC  . losartan  100 mg Oral Daily  . moving right along book   Does not apply Once  . oxymetazoline  1 spray Each Nare BID  . pantoprazole  40 mg Oral Daily  . potassium chloride  20 mEq Oral Daily  . sildenafil  40 mg Oral TID  . sodium chloride  3 mL Intravenous Q12H  . spironolactone  12.5 mg Oral Daily  . torsemide  20 mg Oral Daily  . Warfarin - Pharmacist Dosing Inpatient   Does not apply q1800    Infusions: . sodium chloride 20 mL/hr at 04/09/13 1446  . sodium chloride 20 mL/hr at 04/07/13 0700  . sodium chloride      PRN Medications: sodium chloride, acetaminophen, ALPRAZolam, bisacodyl, bisacodyl, guaiFENesin, magnesium hydroxide, ondansetron (ZOFRAN) IV, sodium chloride, traMADol   Assessment:   1. Acute on chronic systolic HF due to NICM     --s/p HM II VAD on 04/06/13 2. PAH 3. Acute and chronic AF  - now in NSR 4. VT 5. Hypokalemia/hypomagnesemia 6. Epistaxis   Plan/Discussion:    POD # 8 . Doing very well. Volume status looks good. MAPs coming down.  Continue ambulation. Supp electrolytes. Will increase spiro to 25mg  daily and stop demadex. Can give prn demadex as needed.  Continue ambulation and education .   I reviewed  the LVAD parameters from today, and compared the results to the patient's prior recorded data.  No programming changes were made.  The LVAD is functioning within specified parameters.  The patient performs LVAD self-test daily.  LVAD interrogation was negative for any significant power changes, alarms or PI events/speed drops.  LVAD equipment check completed and is in  good working order.  Back-up equipment present.   LVAD education done on emergency procedures and precautions and reviewed exit site care.  Length of Stay: 63  Holly Bodily 04/14/2013, 7:36 AM  VAD Team Pager [phone] (7am - 7am)

## 2013-04-14 NOTE — Progress Notes (Signed)
Patient ID: Brianna Hanson, female   DOB: [birth year], 78 y.o.   MRN: [id number] HeartMate 2 Rounding Note  Subjective:    POD #8 VAD for nonischemic CM Converted to NSR on IV amio- slow rate now--,cont po dose and Lanoxin  0.125 daily No recurrent nosebleeds today OOB to chair, + BM Walked with PT 200 feet today  Surgical incisions all healing well including drive line 2D Echo reviewed 8-10- septum midline,Ao valve not opening, no TR , no effusion   RV with mil-mod dysfunction    LVAD RPM to 9000 with flow>4.2 and no PI eventsCXR edema improved, still with atelectasis at left base --po demedex  INR remains therapeutic 2.45 on 2.5 mg of Coumadin daily No PI events overnight Mean arterial pressure has been elevated and meds are being adjusted  LVAD INTERROGATION:  HeartMate II LVAD:  Flow --- liters/min, speed 9000, power 4.2, PI 6.0.    Objective:    Vital Signs:   Temp:  [98.8 F (37.1 C)-99.3 F (37.4 C)] 98.8 F (37.1 C) (08/13 1355) Pulse Rate:  [74-85] 77 (08/13 1457) Resp:  [18-20] 18 (08/13 1355) SpO2:  [90 %-96 %] 96 % (08/13 1457) Weight:  [160 lb 15 oz (73 kg)] 160 lb 15 oz (73 kg) (08/13 0354) Last BM Date: 04/12/13 Mean arterial Pressure   MAP 98  Intake/Output:   Intake/Output Summary (Last 24 hours) at 04/14/13 1513 Last data filed at 04/14/13 1444  Gross per 24 hour  Intake    750 ml  Output    400 ml  Net    350 ml     Physical Exam: General:  Well appearing. No resp difficulty HEENT: normal Neck: supple.  Cor:  LVAD hum present. I don't hear valve sounds Lungs: decreased in bases Abdomen: soft, nontender, mildly distended. No hepatosplenomegaly. No bruits or masses. Good bowel sounds. Driveline: C/D/I; securement device intact and driveline incorporated Extremities: no cyanosis, mild  ankle edema Neuro: alert & orientedx3, cranial nerves grossly intact. moves all 4 extremities w/o difficulty. Affect pleasant  Telemetry: [id number]  Labs: Basic  Metabolic Panel:  Recent Labs Lab 04/08/13 0400  04/09/13 0425 04/10/13 0400 04/11/13 0400 04/12/13 0400 04/13/13 0525 04/14/13 0545  NA 141  --  140 138 136 138 137 137  K 3.9  < > 3.9 3.6 3.6 3.9 3.5 3.6  CL 110  --  105 103 101 100 97 99  CO2 23  --  29 28 29 30 31 29   GLUCOSE 87  --  96 97 98 84 85 86  BUN 8  --  10 10 9 8 10 10   CREATININE 0.85  --  0.91 0.82 0.86 0.74 0.80 0.88  CALCIUM 8.9  --  8.9 8.9 8.6 8.7 8.8 9.0  MG 2.3  --  2.1 2.1 1.8 1.8  --   --   PHOS 3.4  --  3.0 2.8 2.4 2.8  --   --   < > = values in this interval not displayed.  Liver Function Tests:  Recent Labs Lab 04/09/13 0425  AST 80*  ALT 21  ALKPHOS 70  BILITOT 0.8  PROT 5.8*  ALBUMIN 2.7*   No results found for this basename: LIPASE, AMYLASE,  in the last 168 hours No results found for this basename: AMMONIA,  in the last 168 hours  CBC:  Recent Labs Lab 04/08/13 0400 04/09/13 0425 04/10/13 0400 04/11/13 0400 04/12/13 0400 04/14/13 0545  WBC 12.5* 13.6* 8.5 7.7  8.1 10.5  NEUTROABS 9.9* 10.8* 5.9 4.8 4.6  --   HGB 9.5* 9.6* 8.6* 8.8* 9.5* 9.1*  HCT 28.3* 28.5* 26.0* 26.5* 27.9* 27.2*  MCV 93.7 94.4 94.2 93.6 93.3 92.8  PLT 108* 125* 120* 135* 178 236    INR:2.47  Recent Labs Lab 04/10/13 0400 04/11/13 0400 04/12/13 0400 04/13/13 0525 04/14/13 0545  INR 2.00* 2.48* 2.39* 2.45* 2.47*   LDH 392 <547 <550 Coox 71%  Pulse ox sat 96%  Other results:  EKG:   Imaging: Dg Chest 2 View  04/12/2013   *RADIOLOGY REPORT*  Clinical Data: LVAD  CHEST - 2 VIEW  Comparison: 04/12/2013 at 6:17 hours and 04/11/2013  Findings: Current radiograph is at the 16:57 hours.  Left ventricular assist device is present.  Left chest wall AICD appears stable. Stable left IJ central venous catheter with the distal tip in the proximal superior vena cava, directed laterally.  There are changes of median sternotomy.  Cardiomegaly is stable.  Pulmonary vascular congestion appears to be decreasing.  Overall, aeration of the lungs is improving compared to a chest radiograph of 04/12/2013 at 6:17 hours. Pain left retrocardiac opacity persists. Small bilateral pleural effusions are present.  No evidence of pneumothorax.  Mild gaseous distention of visualized upper abdominal bowel loops without definite evidence of obstruction.  IMPRESSION:  1.  Decreasing pulmonary vascular congestion and improving aeration of the lungs compared to the chest radiograph performed earlier today. A persistent left basilar opacity likely reflect atelectasis. 2.  Small bilateral pleural effusions.   Original Report Authenticated By: Britta Mccreedy, M.D.     Medications:     Scheduled Medications: . amiodarone  200 mg Oral BID  . aspirin EC  325 mg Oral Daily  . bisacodyl  10 mg Oral Daily   Or  . bisacodyl  10 mg Rectal Daily  . carvedilol  3.125 mg Oral BID WC  . cholecalciferol  1,000 Units Oral Daily  . digoxin  0.125 mg Oral Daily  . docusate sodium  200 mg Oral Daily  . feeding supplement  237 mL Oral BID BM  . ferrous fumarate-b12-vitamic C-folic acid  1 capsule Oral TID PC  . hydrALAZINE  12.5 mg Oral Q8H  . losartan  100 mg Oral Daily  . oxymetazoline  1 spray Each Nare BID  . pantoprazole  40 mg Oral Daily  . potassium chloride  20 mEq Oral Daily  . sildenafil  40 mg Oral TID  . sodium chloride  3 mL Intravenous Q12H  . spironolactone  25 mg Oral Daily  . warfarin  2.5 mg Oral ONCE-1800  . Warfarin - Pharmacist Dosing Inpatient   Does not apply q1800    Infusions: . sodium chloride 20 mL/hr at 04/09/13 1446  . sodium chloride 20 mL/hr at 04/07/13 0700  . sodium chloride      PRN Medications: sodium chloride, acetaminophen, ALPRAZolam, bisacodyl, bisacodyl, guaiFENesin, magnesium hydroxide, ondansetron (ZOFRAN) IV, sodium chloride, traMADol, white petrolatum, white petrolatum   Assessment:  She is doing well.  Blood pressure meds need to be titrated for elevated mean arterial  pressure  Plan/Discussion:    . Transferred  to stepdown- cont teaching and PT Will remove epicardial pacing wires today  ContinueI reviewed the LVAD parameters from today, and compared the results to the patient's prior recorded data.  No programming changes were made.  The LVAD is functioning within specified parameters.  The patient performs LVAD self-test daily.  LVAD interrogation was negative for  any significant power changes, alarms or PI events/speed drops.  LVAD equipment check completed and is in good working order.  Back-up equipment present.   LVAD education done on emergency procedures and precautions and reviewed exit site care.  Length of Stay: 9  VAN TRIGT III,Damarri Rampy 04/14/2013, 3:13 PM  VAD Team Pager [phone] (7am - 7am)

## 2013-04-14 NOTE — Progress Notes (Signed)
Occupational Therapy Treatment Patient Details Name: Brianna Hanson MRN: [id number] DOB: 21-Mar-1935 Today's Date: 04/14/2013 Time: 3086-5784 OT Time Calculation (min): 24 min  OT Assessment / Plan / Recommendation  History of present illness 78 year old female with heart failure due severe non-ischemic cardiomyopathy on milrinone and revatio scheduled for Heartmate II LVAD tomorrow for destination therapy   OT comments  Pt making steady progress, however, not ready to D/C home this week. Pt requires max A for LB ADL, max vc to manage lines while connected to power module when ambulating to bathroom and is limited by fatigue. Educated pt on process and safety of line management for functional ambulation when using rollator. . Discussed home set up and importance of simulating this while in hospital to facilitate safe return home. Discussed plan for pt to be responsible for line management and walking to bathroom with staff instead of using the [facility]. Pt also completed partial ADL at sink. Educated pt on E conservation. Pt only able to wash both arms before fatigue limited her performance. Pt given rest break and treatment completed in second session.  Follow Up Recommendations  Home health OT    Barriers to Discharge       Equipment Recommendations  Other (comment);3 in 1 bedside comode    Recommendations for Other Services    Frequency Min 3X/week   Progress towards OT Goals Progress towards OT goals: Progressing toward goals  Plan Discharge plan remains appropriate    Precautions / Restrictions Precautions Precautions: Sternal;Fall Restrictions Weight Bearing Restrictions:  (sternal precautions)   Pertinent Vitals/Pain no apparent distress     ADL  Grooming: Set up;Supervision/safety Where Assessed - Grooming: Supported sitting Upper Body Bathing: Minimal assistance Where Assessed - Upper Body Bathing: Supported sitting Lower Body Bathing: Maximal assistance Where Assessed -  Lower Body Bathing: Supported sit to stand Upper Body Dressing: Moderate assistance Where Assessed - Upper Body Dressing: Unsupported sitting Lower Body Dressing: Maximal assistance Where Assessed - Lower Body Dressing: Supported sit to stand Toilet Transfer: Supervision/safety Statistician Method: Other (comment) (ambulaintg) Toilet Transfer Equipment: Comfort height toilet Toileting - Clothing Manipulation and Hygiene: Supervision/safety Where Assessed - Toileting Clothing Manipulation and Hygiene: Sit to stand from 3-in-1 or toilet Equipment Used: Rolling walker;Other (comment) (LVAD equipment) Transfers/Ambulation Related to ADLs: S. Max vc to manage LVAd equipment and lines ADL Comments: will benefit from AE    OT Diagnosis:    OT Problem List:   OT Treatment Interventions:     OT Goals(current goals can now be found in the care plan section) Acute Rehab OT Goals Patient Stated Goal: to go home OT Goal Formulation: With patient Time For Goal Achievement: 04/22/13 Potential to Achieve Goals: Good ADL Goals Pt Will Perform Grooming: with modified independence;standing Pt Will Perform Upper Body Bathing: with supervision;with set-up;with caregiver independent in assisting;sitting Pt Will Perform Lower Body Bathing: with set-up;with supervision;with caregiver independent in assisting;with adaptive equipment;sit to/from stand Pt Will Perform Upper Body Dressing: with supervision;with set-up;with caregiver independent in assisting;sitting Pt Will Transfer to Toilet: with supervision;ambulating;bedside commode Pt Will Perform Toileting - Clothing Manipulation and hygiene: with modified independence;sitting/lateral leans;sit to/from stand Additional ADL Goal #1: independent withsternal precautions during ADL and mobility for ADL  Visit Information  Last OT Received On: 04/14/13 Assistance Needed: +1 History of Present Illness: 78 year old female with heart failure due severe  non-ischemic cardiomyopathy on milrinone and revatio scheduled for Heartmate II LVAD tomorrow for destination therapy    Subjective  Data   "my energy has walked out the door"   Prior Functioning       Cognition  Cognition Arousal/Alertness: Awake/alert Behavior During Therapy: WFL for tasks assessed/performed Overall Cognitive Status: Within Functional Limits for tasks assessed    Mobility  Transfers Transfers: Sit to Stand;Stand to Sit Sit to Stand: 5: Supervision;From chair/3-in-1 Stand to Sit: 5: Supervision;To bed    Exercises      Balance  S   End of Session OT - End of Session Activity Tolerance: Patient limited by fatigue Patient left: in bed;Other (comment) (EOB) Nurse Communication: Mobility status;Other (comment) (need for pt to manage LVAD equiment)  GO     Brianna Hanson,Brianna Hanson 04/14/2013, 11:08 AM Luisa Dago, OTR/L  [phone] 04/14/2013

## 2013-04-14 NOTE — Progress Notes (Signed)
CARDIAC REHAB PHASE I   PRE:  Rate/Rhythm: 77 SR    BP: sitting 110 MAP  Pt with increased BP. Will hold ambulation right now. Pt c/o HA and being tired. PT sts she hasn't been eating and drinking well. RN aware. Will f/u. 1400-1410  Elissa Lovett [place] CES, ACSM 04/14/2013 2:08 PM

## 2013-04-14 NOTE — Progress Notes (Signed)
Physical Therapy Treatment Patient Details Name: Brianna Hanson MRN: [id number] DOB: 04-13-1935 Today's Date: 04/14/2013 Time: 1100-1131 PT Time Calculation (min): 31 min  PT Assessment / Plan / Recommendation  History of Present Illness 78 year old female with heart failure due severe non-ischemic cardiomyopathy on milrinone and revatio s/p Heartmate II LVAD for destination therapy   PT Comments   Patient demonstrates improvements in mobility increasing ambulation distance today with only 2 standing rest breaks. Patient continues to require assist with transfers at this time. Worked with patient on technique to improve transfers and decreased assistance needed. Patient was able to convert back to module power with some coaching. Overall feel patient is on track.    Follow Up Recommendations  Home health PT;Supervision/Assistance - 24 hour           Equipment Recommendations  Other (comment) (rollator walker)    Recommendations for Other Services    Frequency Min 5X/week   Progress towards PT Goals Progress towards PT goals: Progressing toward goals  Plan Current plan remains appropriate    Precautions / Restrictions Precautions Precautions: Sternal;Fall Precaution Comments: educated patient on sternotomy incision and precautions to ensure healing (educated sister on sternotomy precutions) Restrictions Weight Bearing Restrictions:  (sternal precautions)   Pertinent Vitals/Pain No pain at this time    Mobility  Bed Mobility Bed Mobility: Sit to Sidelying Left Sit to Sidelying Left: 3: Mod assist Transfers Transfers: Sit to Stand;Stand to Sit Sit to Stand: 4: Min assist Stand to Sit: 4: Min guard;To chair/3-in-1 Details for Transfer Assistance: Assist and VCs for sternal precautions and rocker technique today Ambulation/Gait Ambulation/Gait Assistance: 5: Supervision Ambulation Distance (Feet): 210 Feet Assistive device: Rolling walker Ambulation/Gait Assistance Details:  Patient with improved stability during ambulation Gait Pattern: Step-to pattern Gait velocity: decreased General Gait Details: improved stability and activity tolerance today       PT Goals (current goals can now be found in the care plan section) Acute Rehab PT Goals Patient Stated Goal: to go home PT Goal Formulation: With patient Time For Goal Achievement: 04/22/13 Potential to Achieve Goals: Good  Visit Information  Last PT Received On: 04/14/13 Assistance Needed: +1 History of Present Illness: 78 year old female with heart failure due severe non-ischemic cardiomyopathy on milrinone and revatio scheduled for Heartmate II LVAD tomorrow for destination therapy    Subjective Data  Subjective: I sm ready to go Patient Stated Goal: to go home   Cognition  Cognition Arousal/Alertness: Awake/alert Behavior During Therapy: WFL for tasks assessed/performed Overall Cognitive Status: Within Functional Limits for tasks assessed    Balance  Static Sitting Balance Static Sitting - Balance Support: Feet supported Static Sitting - Level of Assistance: 7: Independent Static Standing Balance Static Standing - Balance Support: No upper extremity supported Static Standing - Level of Assistance: 5: Stand by assistance  End of Session PT - End of Session Activity Tolerance: Patient tolerated treatment well;Patient limited by fatigue Patient left: in chair;with call bell/phone within reach Nurse Communication: Mobility status   GP     Fabio Asa 04/14/2013, 11:58 AM Charlotte Crumb, PT DPT  [phone]

## 2013-04-14 NOTE — Progress Notes (Signed)
Nursing Note: Removed EPW per MD order per hospital protocol. Pacing wires intact upon removal. Patient tolerated well, VSS, minimal blood at site. Patient advised to stay in bed for 1 hour. Will continue to monitor closely. Lajuana Matte, RN

## 2013-04-14 NOTE — Progress Notes (Signed)
CSW met with patient this a.m to complete her Health Care Power of Ronald.  Copy placed on chart; original and copies provided to patient to give her family and PCP. Discussed Advanced Directive document and offered to assist patient with completion. Patient defers completion at this time. She is aware of this document and states that she could complete the document in the future.  Patient was appreciative of CSW's assistance.  CSW will continue to check on patient.  Lorri Frederick. [place]  [phone]

## 2013-04-14 NOTE — Progress Notes (Signed)
ANTICOAGULATION CONSULT NOTE - Follow Up Consult  Pharmacy Consult for Warfarin Indication: atrial fibrillation  No Known Allergies  Patient Measurements: Height: 4' 11.84" (152 cm) Weight: 160 lb 15 oz (73 kg) IBW/kg (Calculated) : 45.14  Vital Signs: Temp: 99.1 F (37.3 C) (08/13 0354) Temp src: Oral (08/13 0354) Pulse Rate: 74 (08/13 0354)  Labs:  Recent Labs  04/12/13 0400 04/13/13 0525 04/14/13 0545  HGB 9.5*  --  9.1*  HCT 27.9*  --  27.2*  PLT 178  --  236  LABPROT 25.3* 25.8* 25.9*  INR 2.39* 2.45* 2.47*  CREATININE 0.74 0.80 0.88    Estimated Creatinine Clearance: 48.3 ml/min (by C-G formula based on Cr of 0.88).   Medications:  Prescriptions prior to admission  Medication Sig Dispense Refill  . amiodarone (PACERONE) 200 MG tablet Take 200 mg by mouth daily.      . Cholecalciferol (VITAMIN D3) 1000 UNITS CAPS Take 1 tablet by mouth daily.        . furosemide (LASIX) 80 MG tablet Take 80 mg by mouth 2 (two) times daily.      [name] losartan (COZAAR) 50 MG tablet Take 50 mg by mouth daily.      . milrinone (PRIMACOR) 20 MG/100ML SOLN infusion Inject 28.7625 mcg/min into the vein continuous.  100 mL  6  . potassium chloride SA (K-DUR,KLOR-CON) 20 MEQ tablet Take 20 mEq by mouth 2 (two) times daily.      . sildenafil (REVATIO) 20 MG tablet Take 60 mg by mouth 3 (three) times daily.      [name] warfarin (COUMADIN) 2.5 MG tablet Take 2.5 mg by mouth daily.        Assessment: 78 yo F with PMH of Afib and HF d/t severe NICM who presented for LVAD placement on 04/06/13. On warfarin PTA at 2.5 mg po daily. Warfarin was restarted on 8/6 (MD dosing) with BL INR of 1.44. Pharmacy consulted today for follow up dosing.   Patient had an episode of epistaxis on 8/12 which lasted >15 mins but was stabilized with cauterization by ENT (pt states she has a h/o epistaxis on warfarin). She denies any bleeding since yesterday am, no other s/s of bleeds.  INR today therapeutic at 2.47. H/H  (9.1/27.2) low but stable since surgery, also has h/o chronic anemia.  Goal of Therapy:  INR 2-3 Monitor platelets by anticoagulation protocol: Yes   Plan:  - Warfarin 2.5 mg po x 1  - Monitor INR, s/s bleeding, clinical status daily   Margie Billet, PharmD Clinical Pharmacist - Resident Pager: [phone] Pharmacy: [phone] 04/14/2013 8:55 AM

## 2013-04-15 ENCOUNTER — Inpatient Hospital Stay (HOSPITAL_COMMUNITY): Payer: Medicare Other

## 2013-04-15 DIAGNOSIS — Z95818 Presence of other cardiac implants and grafts: Secondary | ICD-10-CM

## 2013-04-15 DIAGNOSIS — I5023 Acute on chronic systolic (congestive) heart failure: Secondary | ICD-10-CM

## 2013-04-15 LAB — LACTATE DEHYDROGENASE: LDH: 502 U/L — ABNORMAL HIGH (ref 94–250)

## 2013-04-15 LAB — PROTIME-INR
INR: 2.53 — ABNORMAL HIGH (ref 0.00–1.49)
Prothrombin Time: 26.4 seconds — ABNORMAL HIGH (ref 11.6–15.2)

## 2013-04-15 LAB — CBC
HCT: 26.7 % — ABNORMAL LOW (ref 36.0–46.0)
Hemoglobin: 8.7 g/dL — ABNORMAL LOW (ref 12.0–15.0)
MCH: 30.4 pg (ref 26.0–34.0)
MCHC: 32.6 g/dL (ref 30.0–36.0)
MCV: 93.4 fL (ref 78.0–100.0)
Platelets: 262 10*3/uL (ref 150–400)
RBC: 2.86 MIL/uL — ABNORMAL LOW (ref 3.87–5.11)
RDW: 17.4 % — ABNORMAL HIGH (ref 11.5–15.5)
WBC: 8.6 10*3/uL (ref 4.0–10.5)

## 2013-04-15 LAB — COMPREHENSIVE METABOLIC PANEL
ALT: 17 U/L (ref 0–35)
AST: 29 U/L (ref 0–37)
Albumin: 2.5 g/dL — ABNORMAL LOW (ref 3.5–5.2)
Alkaline Phosphatase: 90 U/L (ref 39–117)
BUN: 8 mg/dL (ref 6–23)
CO2: 28 mEq/L (ref 19–32)
Calcium: 8.6 mg/dL (ref 8.4–10.5)
Chloride: 99 mEq/L (ref 96–112)
Creatinine, Ser: 0.75 mg/dL (ref 0.50–1.10)
GFR calc Af Amer: >90 mL/min (ref >90)
GFR calc non Af Amer: 80 mL/min — ABNORMAL LOW (ref >90)
Glucose, Bld: 78 mg/dL (ref 70–99)
Potassium: 4.3 mEq/L (ref 3.5–5.1)
Sodium: 135 mEq/L (ref 135–145)
Total Bilirubin: 0.6 mg/dL (ref 0.3–1.2)
Total Protein: 6.4 g/dL (ref 6.0–8.3)

## 2013-04-15 LAB — DIGOXIN LEVEL: Digoxin Level: 0.6 ng/mL — ABNORMAL LOW (ref 0.8–2.0)

## 2013-04-15 MED ORDER — AMLODIPINE BESYLATE 5 MG PO TABS
5.0000 mg | ORAL_TABLET | Freq: Every day | ORAL | Status: DC
  Administered 2013-04-15 – 2013-04-16 (×2): 5 mg via ORAL
  Filled 2013-04-15 (×3): qty 1

## 2013-04-15 MED ORDER — TORSEMIDE 20 MG PO TABS
20.0000 mg | ORAL_TABLET | Freq: Every day | ORAL | Status: DC
  Administered 2013-04-15: 20 mg via ORAL
  Filled 2013-04-15 (×2): qty 1

## 2013-04-15 MED ORDER — WARFARIN SODIUM 2.5 MG PO TABS
2.5000 mg | ORAL_TABLET | Freq: Once | ORAL | Status: AC
  Administered 2013-04-15: 2.5 mg via ORAL
  Filled 2013-04-15: qty 1

## 2013-04-15 MED ORDER — CARVEDILOL 6.25 MG PO TABS
6.2500 mg | ORAL_TABLET | Freq: Two times a day (BID) | ORAL | Status: DC
  Administered 2013-04-15 – 2013-04-20 (×11): 6.25 mg via ORAL
  Filled 2013-04-15 (×13): qty 1

## 2013-04-15 NOTE — Progress Notes (Signed)
Delivered HM II VAD Discharge Binder pt's room. Reviewed contents and asked pt and husband to complete reading contents of binder.  Sister unable to come for discharge teaching today; re-scheduled teaching session for 2 pm tomorrow.   Verbal and written consent from patient for VAD coordinator to contact the following regarding planned discharge home with LVAD:  1.  [facility]  2.  [employer]   Pt is connecting/disconnecting power cables and changing power source from power module to batteries and batteries to power module with more strength.  Husband changed patient from batteries to power module, assisted pt back to bed using sternal precautions with ease.  Husband observed VAD coordinator change pt's VAD dressing. Exit site with dark bloody drainage; no erythema, tenderness, or foul odor noted. Re-applied gauze dressing after placing silver strip around exit site. Drive line secured with attachment device. Explained importance of keeping drive line secure to promote tissue ingrowth to both pt and husband, both verbalized understanding of same.  Demonstrated sterile glove technique, husband performed return demonstration without difficulty.  Daily diary sheet explained to pt/husband and left at bedside; encouraged both fill out daily while still in hospital.  Encouraged pt to increase calorie intake (especially high protein calories) to ensure healing of surgical wounds and drive line site. Pt verbalized understanding of above.

## 2013-04-15 NOTE — Progress Notes (Signed)
Patient ID: Brianna Hanson, female   DOB: 12/02/1934, 78 y.o.   MRN: [id number] HeartMate 2 Rounding Note  Subjective:    POD #9VAD for nonischemic  Still feeling poorly with headaches and weakness probably because of elevated arterial pressure--by mouth meds are being adjusted, Norvasc added No recurrent nosebleeds today OOB to chair, + BM Walked with PT 150 feet today  Surgical incisions all healing well including drive line 2D Echo reviewed 8-10- septum midline,Ao valve not opening, no TR , no effusion   RV with mil-mod dysfunction    LVAD RPM to 9000 with flow>4.2 and no PI eventsCXR edema improved, still with atelectasis at left base --po demedex  INR remains therapeutic 2.45 on 2.5 mg of Coumadin daily No PI events overnight Mean arterial pressure has been elevated and meds are being adjusted  LVAD INTERROGATION:  HeartMate II LVAD:  Flow 5.0 liters/min, speed 9000, power 4.2, PI 5.8.    Objective:    Vital Signs:   Temp:  [98 F (36.7 C)-98.3 F (36.8 C)] 98.3 F (36.8 C) (08/14 0434) Pulse Rate:  [67-73] 71 (08/14 0434) Resp:  [20] 20 (08/14 0434) SpO2:  [98 %-100 %] 98 % (08/14 0434) Weight:  [163 lb 5.8 oz (74.1 kg)] 163 lb 5.8 oz (74.1 kg) (08/14 0434) Last BM Date: 04/12/13 Mean arterial Pressure   MAP 98  Intake/Output:   Intake/Output Summary (Last 24 hours) at 04/15/13 1713 Last data filed at 04/15/13 1300  Gross per 24 hour  Intake    360 ml  Output    300 ml  Net     60 ml     Physical Exam: General:  Well appearing. No resp difficulty HEENT: normal Neck: supple.  Cor:  LVAD hum present. I don't hear valve sounds Lungs: decreased in bases Abdomen: soft, nontender, mildly distended. No hepatosplenomegaly. No bruits or masses. Good bowel sounds. Driveline: C/D/I; securement device intact and driveline incorporated Extremities: no cyanosis, mild  ankle edema Neuro: alert & orientedx3, cranial nerves grossly intact. moves all 4 extremities w/o  difficulty. Affect pleasant  Telemetry: sinus74  Labs: Basic Metabolic Panel:  Recent Labs Lab 04/09/13 0425 04/10/13 0400 04/11/13 0400 04/12/13 0400 04/13/13 0525 04/14/13 0545 04/15/13 0500  NA 140 138 136 138 137 137 135  K 3.9 3.6 3.6 3.9 3.5 3.6 4.3  CL 105 103 101 100 97 99 99  CO2 29 28 29 30 31 29 28   GLUCOSE 96 97 98 84 85 86 78  BUN 10 10 9 8 10 10 8   CREATININE 0.91 0.82 0.86 0.74 0.80 0.88 0.75  CALCIUM 8.9 8.9 8.6 8.7 8.8 9.0 8.6  MG 2.1 2.1 1.8 1.8  --   --   --   PHOS 3.0 2.8 2.4 2.8  --   --   --     Liver Function Tests:  Recent Labs Lab 04/09/13 0425 04/15/13 0500  AST 80* 29  ALT 21 17  ALKPHOS 70 90  BILITOT 0.8 0.6  PROT 5.8* 6.4  ALBUMIN 2.7* 2.5*   No results found for this basename: LIPASE, AMYLASE,  in the last 168 hours No results found for this basename: AMMONIA,  in the last 168 hours  CBC:  Recent Labs Lab 04/09/13 0425 04/10/13 0400 04/11/13 0400 04/12/13 0400 04/14/13 0545 04/15/13 0500  WBC 13.6* 8.5 7.7 8.1 10.5 8.6  NEUTROABS 10.8* 5.9 4.8 4.6  --   --   HGB 9.6* 8.6* 8.8* 9.5* 9.1* 8.7*  HCT 28.5* 26.0* 26.5* 27.9* 27.2* 26.7*  MCV 94.4 94.2 93.6 93.3 92.8 93.4  PLT 125* 120* 135* 178 236 262    INR:2.47  Recent Labs Lab 04/11/13 0400 04/12/13 0400 04/13/13 0525 04/14/13 0545 04/15/13 0500  INR 2.48* 2.39* 2.45* 2.47* 2.53*   LDH 392 <547 <550 [place]ox 71%  Pulse ox sat 96%  Other results:  EKG:   Imaging: Dg Chest 2 View  04/15/2013   *RADIOLOGY REPORT*  Clinical Data: Heart failure with ventricular assist device placement  CHEST - 2 VIEW  Comparison: April 12, 2013  Findings:  There is persistent consolidation in the left base. Elsewhere, lungs clear.  Heart is enlarged with normal pulmonary vascularity.  Central catheter tip is in the superior vena cava near the cavoatrial junction.  No pneumothorax.  Left ventricular assist device remains in place.  Pacemaker leads are attached to the right atrium  and right ventricle.  There is a hiatal hernia present.  IMPRESSION: Persistent left lower lobe consolidation.  Hiatal hernia present. Stable cardiomegaly.  Lungs elsewhere clear.   Original Report Authenticated By: Bretta Bang, M.D.     Medications:     Scheduled Medications: . amiodarone  200 mg Oral BID  . amLODipine  5 mg Oral Daily  . aspirin EC  325 mg Oral Daily  . bisacodyl  10 mg Oral Daily   Or  . bisacodyl  10 mg Rectal Daily  . carvedilol  6.25 mg Oral BID WC  . cholecalciferol  1,000 Units Oral Daily  . digoxin  0.125 mg Oral Daily  . docusate sodium  200 mg Oral Daily  . feeding supplement  237 mL Oral BID BM  . ferrous fumarate-b12-vitamic C-folic acid  1 capsule Oral TID PC  . hydrALAZINE  12.5 mg Oral Q8H  . losartan  100 mg Oral Daily  . oxymetazoline  1 spray Each Nare BID  . pantoprazole  40 mg Oral Daily  . potassium chloride  20 mEq Oral Daily  . sildenafil  40 mg Oral TID  . sodium chloride  3 mL Intravenous Q12H  . spironolactone  25 mg Oral Daily  . torsemide  20 mg Oral Daily  . warfarin  2.5 mg Oral ONCE-1800  . Warfarin - Pharmacist Dosing Inpatient   Does not apply q1800    Infusions: . sodium chloride 20 mL/hr at 04/09/13 1446  . sodium chloride 20 mL/hr at 04/07/13 0700  . sodium chloride      PRN Medications: sodium chloride, acetaminophen, ALPRAZolam, bisacodyl, bisacodyl, guaiFENesin, hydrALAZINE, magnesium hydroxide, ondansetron (ZOFRAN) IV, sodium chloride, traMADol, white petrolatum, white petrolatum   Assessment:  She is doing well and continues to progress with her equipment education Blood pressure meds need to be titrated for elevated mean arterial pressure Serum albumin decreased to 2.5, encourage better nutrition Hemoglobin decreased to 8.7, continue by mouth iron supplement Chest x-ray shows improved lung volumes, decreased atelectasis at left base Plan/Discussion:    Needs to progress further with ambulation-physical  therapy Following bloody drainage from drive line exit site Progressing with equipment education LVAD functioning properly with hardly any PI events or power spikes   I reviewed the LVAD parameters from today, and compared the results to the patient's prior recorded data.  No programming changes were made.  The LVAD is functioning within specified parameters.  The patient performs LVAD self-test daily.  LVAD interrogation was negative for any significant power changes, alarms or PI events/speed drops.  LVAD equipment check  completed and is in good working order.  Back-up equipment present.   LVAD education done on emergency procedures and precautions and reviewed exit site care.  Length of Stay: 10  VAN TRIGT III,Vertis Scheib 04/15/2013, 5:13 PM  VAD Team Pager [phone] (7am - 7am)

## 2013-04-15 NOTE — Progress Notes (Signed)
Physical Therapy Treatment Patient Details Name: Brianna Hanson MRN: [id number] DOB: May 21, 1935 Today's Date: 04/15/2013 Time: 1308-6578 PT Time Calculation (min): 25 min  PT Assessment / Plan / Recommendation  History of Present Illness 78 year old female with heart failure due severe non-ischemic cardiomyopathy on milrinone and revatio scheduled for Heartmate II LVAD tomorrow for destination therapy   PT Comments   Able to increase ambulation distance today but still gets easily fatigued. Patient also demonstrates improvements in LVAD equipment management and conversion from battery source to power module. Will continue to progress activity with patient as tolerated.    Follow Up Recommendations  Home health PT;Supervision/Assistance - 24 hour           Equipment Recommendations  Other (comment) (rollator walker)       Frequency Min 5X/week   Progress towards PT Goals Progress towards PT goals: Progressing toward goals  Plan Current plan remains appropriate    Precautions / Restrictions Precautions Precautions: Sternal;Fall Precaution Comments: educated patient on sternotomy incision and precautions to ensure healing (educated sister on sternotomy precutions) Restrictions Weight Bearing Restrictions: Yes (Sternal precautions)   Pertinent Vitals/Pain Slight headache reported    Mobility  Transfers Transfers: Sit to Stand;Stand to Sit Sit to Stand: 4: Min assist Stand to Sit: 4: Min guard;To chair/3-in-1 Details for Transfer Assistance: Better compliance with precautions today, able to transfer from chair and toilet Ambulation/Gait Ambulation/Gait Assistance: 5: Supervision Ambulation Distance (Feet): 350 Feet Assistive device: Rolling walker Ambulation/Gait Assistance Details: 2 standing rest breaks Gait Pattern: Step-to pattern Gait velocity: decreased General Gait Details: improved stability and activity tolerance today      PT Goals (current goals can now be  found in the care plan section) Acute Rehab PT Goals Patient Stated Goal: to go home PT Goal Formulation: With patient Time For Goal Achievement: 04/22/13 Potential to Achieve Goals: Good  Visit Information  Last PT Received On: 04/15/13 Assistance Needed: +1 Reason Eval/Treat Not Completed: Patient not medically ready History of Present Illness: 78 year old female with heart failure due severe non-ischemic cardiomyopathy on milrinone and revatio scheduled for Heartmate II LVAD tomorrow for destination therapy    Subjective Data  Subjective: I have a little headache Patient Stated Goal: to go home   Cognition  Cognition Arousal/Alertness: Awake/alert Behavior During Therapy: WFL for tasks assessed/performed Overall Cognitive Status: Within Functional Limits for tasks assessed    Balance  Static Sitting Balance Static Sitting - Balance Support: Feet supported Static Sitting - Level of Assistance: 7: Independent Static Standing Balance Static Standing - Balance Support: No upper extremity supported Static Standing - Level of Assistance: 5: Stand by assistance  End of Session PT - End of Session Equipment Utilized During Treatment: Oxygen Activity Tolerance: Patient tolerated treatment well;Patient limited by fatigue Patient left: in chair;with call bell/phone within reach Nurse Communication: Mobility status   GP     Fabio Asa 04/15/2013, 11:46 AM Charlotte Crumb, PT DPT  [phone]

## 2013-04-15 NOTE — Progress Notes (Signed)
VAD dressing removed and site care performed using sterile technique; gauze dressing and silver strip around exit site re-applied. Exit site with two sutures in place, no tissue ingrowth noted. Exit site with dark bloody drainage, no redness, tenderness, or foul odor noted; serous drainage noted when cleaning exit site; drive line anchor device in place.  Scheduled VAD education with patient and family (husband and sister) tomorrow at 2 pm.

## 2013-04-15 NOTE — Progress Notes (Signed)
CARDIAC REHAB PHASE I   PRE:  Rate/Rhythm: 69SR  BP:  Supine:   Sitting: 88/ dopplered  Standing:    SaO2: 93%RA  MODE:  Ambulation: 150 ft   POST:  Rate/Rhythm: 71 SR  BP:  Supine:   Sitting: 90/dopplered  Standing:    SaO2: 87%RA, 93%RA with rest. 4098-1191 Assisted pt to bathroom and then she rested a few minutes. Pt then walked 150 ft on RA with rollator and asst x 1 with steady gait. Stopped twice to rest. Pt did not feel that she could go farther as she was very tired. Nose bleeding sporadically. To recliner after walk. Left pt on battery source as coordinator here to educate.Desat on RA but quickly to 93% with rest. Pt was able to put self to battery source with little asst.   Luetta Nutting, RN BSN  04/15/2013 2:24 PM

## 2013-04-15 NOTE — Progress Notes (Signed)
ANTICOAGULATION CONSULT NOTE - Follow Up Consult  Pharmacy Consult for Warfarin Indication: atrial fibrillation  No Known Allergies  Patient Measurements: Height: 4' 11.84" (152 cm) Weight: 163 lb 5.8 oz (74.1 kg) IBW/kg (Calculated) : 45.14  Vital Signs: Temp: 98.3 F (36.8 C) (08/14 0434) Temp src: Oral (08/14 0434) Pulse Rate: 71 (08/14 0434)  Labs:  Recent Labs  04/13/13 0525 04/14/13 0545 04/15/13 0500  HGB  --  9.1* 8.7*  HCT  --  27.2* 26.7*  PLT  --  236 262  LABPROT 25.8* 25.9* 26.4*  INR 2.45* 2.47* 2.53*  CREATININE 0.80 0.88 0.75    Estimated Creatinine Clearance: 53.6 ml/min (by C-G formula based on Cr of 0.75).   Medications:  Prescriptions prior to admission  Medication Sig Dispense Refill  . amiodarone (PACERONE) 200 MG tablet Take 200 mg by mouth daily.      . Cholecalciferol (VITAMIN D3) 1000 UNITS CAPS Take 1 tablet by mouth daily.        . furosemide (LASIX) 80 MG tablet Take 80 mg by mouth 2 (two) times daily.      [name] losartan (COZAAR) 50 MG tablet Take 50 mg by mouth daily.      . milrinone (PRIMACOR) 20 MG/100ML SOLN infusion Inject 28.7625 mcg/min into the vein continuous.  100 mL  6  . potassium chloride SA (K-DUR,KLOR-CON) 20 MEQ tablet Take 20 mEq by mouth 2 (two) times daily.      . sildenafil (REVATIO) 20 MG tablet Take 60 mg by mouth 3 (three) times daily.      [name] warfarin (COUMADIN) 2.5 MG tablet Take 2.5 mg by mouth daily.        Assessment: 78 yo F with PMH of Afib and HF d/t severe NICM who presented for LVAD placement on 04/06/13. On warfarin PTA at 2.5 mg po daily. Warfarin was restarted on 8/6 (MD dosing) with BL INR of 1.44. Pharmacy consulted for follow up dosing.   Patient had an episode of epistaxis on 8/12 which lasted >15 mins but was stabilized with cauterization by ENT (pt states she has a h/o epistaxis on warfarin). No s/s of bleeding since. INR today therapeutic at 2.53. H/H (8.7/26.7) slightly lower than bl (h/o chronic  anemia).  Goal of Therapy:  INR 2-3 Monitor platelets by anticoagulation protocol: Yes   Plan:  - Warfarin 2.5 mg po x 1  - Monitor INR, CBC, s/s bleeding, clinical status daily   Margie Billet, PharmD Clinical Pharmacist - Resident Pager: [phone] Pharmacy: [phone] 04/15/2013 3:03 PM

## 2013-04-15 NOTE — Progress Notes (Signed)
Patient ID: PATRENA Hanson, female   DOB: May 27, 1935, 78 y.o.   MRN: [id number] HeartMate 2 Rounding Note  Subjective:    78 y/o female with severe CHF due to NICM, PAH, chronic AF underwent HM II VAD implant 04/06/13  Feels tired.  Walked with PT only once yesterday. MAPs have been elevated despite med titration. Had HA last night. This am feels like nose is going to start bleeding again.  Weight going back up without torsemide. I/Os +. Maintaining NSR. MAP 106 last night was 106 and she had a headache.   MAP 82-106  INR 2.5 LDH 558-> 419 -> 429-> 502  LVAD INTERROGATION:  HeartMate II LVAD:  Flow 4.7 liters/min, speed 9000, power 5.0, PI 7.1  No PI events in last 24 hours   Objective:    Vital Signs:   Temp:  [98 F (36.7 C)-98.8 F (37.1 C)] 98.3 F (36.8 C) (08/14 0434) Pulse Rate:  [67-85] 71 (08/14 0434) Resp:  [18-20] 20 (08/14 0434) SpO2:  [90 %-100 %] 98 % (08/14 0434) Weight:  [74.1 kg (163 lb 5.8 oz)] 74.1 kg (163 lb 5.8 oz) (08/14 0434) Last BM Date: 04/12/13 Mean arterial Pressure 90s  Intake/Output:   Intake/Output Summary (Last 24 hours) at 04/15/13 0730 Last data filed at 04/14/13 1444  Gross per 24 hour  Intake    510 ml  Output    400 ml  Net    110 ml     Physical Exam: General: Awake comfortable; sitting in chair HEENT: Normal Neck: supple. JVP 8-9. Carotids 2+ bilat; no bruits. No lymphadenopathy or thryomegaly appreciated. Cor: Mechanical heart sounds with LVAD hum present. Dressing c/d/i Lungs: Decreased at bases with mild crackles Abdomen: soft, nontender,  nondistended. + BS Driveline: C/D/I; securement device intact;  Extremities: no cyanosis, clubbing, rash, edema  Neuro: awake alert. Pleasant. nonfocal  Telemetry: sinus 70-80s  Labs: Basic Metabolic Panel:  Recent Labs Lab 04/09/13 0425 04/10/13 0400 04/11/13 0400 04/12/13 0400 04/13/13 0525 04/14/13 0545 04/15/13 0500  NA 140 138 136 138 137 137 135  K 3.9 3.6 3.6 3.9 3.5 3.6 4.3   CL 105 103 101 100 97 99 99  CO2 29 28 29 30 31 29 28   GLUCOSE 96 97 98 84 85 86 78  BUN 10 10 9 8 10 10 8   CREATININE 0.91 0.82 0.86 0.74 0.80 0.88 0.75  CALCIUM 8.9 8.9 8.6 8.7 8.8 9.0 8.6  MG 2.1 2.1 1.8 1.8  --   --   --   PHOS 3.0 2.8 2.4 2.8  --   --   --     Liver Function Tests:  Recent Labs Lab 04/09/13 0425 04/15/13 0500  AST 80* 29  ALT 21 17  ALKPHOS 70 90  BILITOT 0.8 0.6  PROT 5.8* 6.4  ALBUMIN 2.7* 2.5*   No results found for this basename: LIPASE, AMYLASE,  in the last 168 hours No results found for this basename: AMMONIA,  in the last 168 hours  CBC:  Recent Labs Lab 04/09/13 0425 04/10/13 0400 04/11/13 0400 04/12/13 0400 04/14/13 0545 04/15/13 0500  WBC 13.6* 8.5 7.7 8.1 10.5 8.6  NEUTROABS 10.8* 5.9 4.8 4.6  --   --   HGB 9.6* 8.6* 8.8* 9.5* 9.1* 8.7*  HCT 28.5* 26.0* 26.5* 27.9* 27.2* 26.7*  MCV 94.4 94.2 93.6 93.3 92.8 93.4  PLT 125* 120* 135* 178 236 262    INR: 1.39  Recent Labs Lab 04/11/13 0400 04/12/13 0400  04/13/13 0525 04/14/13 0545 04/15/13 0500  INR 2.48* 2.39* 2.45* 2.47* 2.53*   Imaging: No results found.  Medications:     Scheduled Medications: . amiodarone  200 mg Oral BID  . aspirin EC  325 mg Oral Daily  . bisacodyl  10 mg Oral Daily   Or  . bisacodyl  10 mg Rectal Daily  . carvedilol  3.125 mg Oral BID WC  . cholecalciferol  1,000 Units Oral Daily  . digoxin  0.125 mg Oral Daily  . docusate sodium  200 mg Oral Daily  . feeding supplement  237 mL Oral BID BM  . ferrous fumarate-b12-vitamic C-folic acid  1 capsule Oral TID PC  . hydrALAZINE  12.5 mg Oral Q8H  . losartan  100 mg Oral Daily  . oxymetazoline  1 spray Each Nare BID  . pantoprazole  40 mg Oral Daily  . potassium chloride  20 mEq Oral Daily  . sildenafil  40 mg Oral TID  . sodium chloride  3 mL Intravenous Q12H  . spironolactone  25 mg Oral Daily  . Warfarin - Pharmacist Dosing Inpatient   Does not apply q1800    Infusions: . sodium  chloride 20 mL/hr at 04/09/13 1446  . sodium chloride 20 mL/hr at 04/07/13 0700  . sodium chloride      PRN Medications: sodium chloride, acetaminophen, ALPRAZolam, bisacodyl, bisacodyl, guaiFENesin, hydrALAZINE, magnesium hydroxide, ondansetron (ZOFRAN) IV, sodium chloride, traMADol, white petrolatum, white petrolatum   Assessment:   1. Acute on chronic systolic HF due to NICM     --s/p HM II VAD on 04/06/13 2. PAH 3. Acute and chronic AF  - now in NSR 4. VT 5. Hypokalemia/hypomagnesemia 6. Epistaxis   Plan/Discussion:    POD # 9 . Doing well. Progressing slowly though. MAPs remain elevated. Mild volume overload. Will restart demadex. Increase carvedilol and add amlodipine. Watch MAPs closely. If epistaxis recurs may need packing.   Continue ambulation and education . Probably will shoot to get her home early next week.   Maintaining NSR on amio.  INR looks good.  I reviewed the LVAD parameters from today, and compared the results to the patient's prior recorded data.  No programming changes were made.  The LVAD is functioning within specified parameters.  The patient performs LVAD self-test daily.  LVAD interrogation was negative for any significant power changes, alarms or PI events/speed drops.  LVAD equipment check completed and is in good working order.  Back-up equipment present.   LVAD education done on emergency procedures and precautions and reviewed exit site care.  Length of Stay: 10  Holly Bodily 04/15/2013, 7:30 AM  VAD Team Pager [phone] (7am - 7am)

## 2013-04-15 NOTE — Progress Notes (Signed)
Occupational Therapy Treatment Patient Details Name: Brianna Hanson MRN: [id number] DOB: 08-30-1935 Today's Date: 04/15/2013 Time: 4098-1191 OT Time Calculation (min): 31 min  OT Assessment / Plan / Recommendation  History of present illness 78 year old female with heart failure due severe non-ischemic cardiomyopathy on milrinone and revatio scheduled for Heartmate II LVAD tomorrow for destination therapy   OT comments  Pt continues to make progress. Began education on AE for ADL. Pt/boyfriend working together on ADL. Pt states that she has been doing better with managing  her power cord when ambulating to the bathroom with both staff and her boyfriend. Pt fatigues very easily. Not ready for D/C at this time.  Follow Up Recommendations  Home health OT    Barriers to Discharge       Equipment Recommendations  Other (comment);3 in 1 bedside comode    Recommendations for Other Services    Frequency Min 3X/week   Progress towards OT Goals Progress towards OT goals: Progressing toward goals  Plan Discharge plan remains appropriate    Precautions / Restrictions Precautions Precautions: Sternal;Fall Restrictions Weight Bearing Restrictions: Yes (Sternal precautions)   Pertinent Vitals/Pain no apparent distress C/o HA and back pain. nsg aware and gave meds    ADL  ADL Comments: Began education with AE and ADL. Pt swtiching self to battery system with min vc    OT Diagnosis:    OT Problem List:   OT Treatment Interventions:     OT Goals(current goals can now be found in the care plan section) Acute Rehab OT Goals Patient Stated Goal: to go home OT Goal Formulation: With patient Time For Goal Achievement: 04/22/13 Potential to Achieve Goals: Good ADL Goals Pt Will Perform Grooming: with modified independence;standing Pt Will Perform Upper Body Bathing: with supervision;with set-up;with caregiver independent in assisting;sitting Pt Will Perform Lower Body Bathing: with  set-up;with supervision;with caregiver independent in assisting;with adaptive equipment;sit to/from stand Pt Will Perform Upper Body Dressing: with supervision;with set-up;with caregiver independent in assisting;sitting Pt Will Transfer to Toilet: with supervision;ambulating;bedside commode Pt Will Perform Toileting - Clothing Manipulation and hygiene: with modified independence;sitting/lateral leans;sit to/from stand Additional ADL Goal #1: independent withsternal precautions during ADL and mobility for ADL  Visit Information  Last OT Received On: 04/15/13 History of Present Illness: 78 year old female with heart failure due severe non-ischemic cardiomyopathy on milrinone and revatio scheduled for Heartmate II LVAD tomorrow for destination therapy    Subjective Data      Prior Functioning       Cognition  Cognition Arousal/Alertness: Awake/alert Behavior During Therapy: WFL for tasks assessed/performed Overall Cognitive Status: Within Functional Limits for tasks assessed    Mobility  Transfers Transfers: Sit to Stand;Stand to Sit Sit to Stand: 4: Min guard;From chair/3-in-1 Stand to Sit: 5: Supervision;To chair/3-in-1 Details for Transfer Assistance: vc for hand placement    Exercises      Balance  S   End of Session OT - End of Session Equipment Utilized During Treatment: Rolling walker Activity Tolerance: Patient limited by fatigue Patient left: Other (comment) (going to x ray) Nurse Communication: Mobility status;Other (comment) (pt grogress)  GO     Brianna Hanson,HILLARY 04/15/2013, 9:41 AM [facility], OTR/L  [phone] 04/15/2013

## 2013-04-16 ENCOUNTER — Encounter (HOSPITAL_COMMUNITY): Payer: Self-pay | Admitting: Surgery

## 2013-04-16 LAB — BASIC METABOLIC PANEL
BUN: 7 mg/dL (ref 6–23)
CO2: 26 mEq/L (ref 19–32)
Calcium: 9 mg/dL (ref 8.4–10.5)
Chloride: 100 mEq/L (ref 96–112)
Creatinine, Ser: 0.86 mg/dL (ref 0.50–1.10)
GFR calc Af Amer: 74 mL/min — ABNORMAL LOW (ref >90)
GFR calc non Af Amer: 64 mL/min — ABNORMAL LOW (ref >90)
Glucose, Bld: 89 mg/dL (ref 70–99)
Potassium: 3.7 mEq/L (ref 3.5–5.1)
Sodium: 136 mEq/L (ref 135–145)

## 2013-04-16 LAB — LACTATE DEHYDROGENASE: LDH: 428 U/L — ABNORMAL HIGH (ref 94–250)

## 2013-04-16 LAB — PROTIME-INR
INR: 2.32 — ABNORMAL HIGH (ref 0.00–1.49)
Prothrombin Time: 24.7 seconds — ABNORMAL HIGH (ref 11.6–15.2)

## 2013-04-16 MED ORDER — TORSEMIDE 20 MG PO TABS
20.0000 mg | ORAL_TABLET | Freq: Every day | ORAL | Status: DC
  Administered 2013-04-16: 20 mg via ORAL
  Filled 2013-04-16 (×2): qty 1

## 2013-04-16 MED ORDER — SODIUM CHLORIDE 0.9 % IJ SOLN
10.0000 mL | INTRAMUSCULAR | Status: DC | PRN
  Administered 2013-04-16: 10 mL
  Administered 2013-04-17: 20 mL
  Administered 2013-04-20 – 2013-04-22 (×2): 10 mL

## 2013-04-16 MED ORDER — WARFARIN SODIUM 2.5 MG PO TABS
2.5000 mg | ORAL_TABLET | Freq: Once | ORAL | Status: AC
  Administered 2013-04-16: 2.5 mg via ORAL
  Filled 2013-04-16: qty 1

## 2013-04-16 MED ORDER — HYDRALAZINE HCL 25 MG PO TABS
25.0000 mg | ORAL_TABLET | Freq: Three times a day (TID) | ORAL | Status: DC
  Administered 2013-04-16 – 2013-04-22 (×19): 25 mg via ORAL
  Filled 2013-04-16 (×21): qty 1

## 2013-04-16 NOTE — Progress Notes (Signed)
Patient ID: Brianna Hanson, female   DOB: 24-Jan-1935, 78 y.o.   MRN: [id number] HeartMate 2 Rounding Note  Subjective:    78 y/o female with severe CHF due to NICM, PAH, chronic AF underwent HM II VAD implant 04/06/13  POD #10. Feeling less fatigued and no longer complaining of headache. Feels tired.  Walked with PT 350 ft yesterday with no SOB. MAPs remain elevated 90-100s. Weight down 5 lbs and I/O not accurate not measuring.      INR 2.3 LDH 558-> 419 -> 429-> 502>428  LVAD INTERROGATION:  HeartMate II LVAD:  Flow 4.9 liters/min, speed 8980, power 4.7, PI 7.1  1 PI events in last 24 hours   Objective:    Vital Signs:   Temp:  [98 F (36.7 C)-98.7 F (37.1 C)] 98.7 F (37.1 C) (08/15 0431) Pulse Rate:  [72-78] 72 (08/15 0431) Resp:  [20] 20 (08/15 0431) SpO2:  [95 %-98 %] 95 % (08/15 0431) Weight:  [71.7 kg (158 lb 1.1 oz)] 71.7 kg (158 lb 1.1 oz) (08/15 0431) Last BM Date: 04/15/13 Mean arterial Pressure 90-100s  Intake/Output:   Intake/Output Summary (Last 24 hours) at 04/16/13 1346 Last data filed at 04/16/13 0800  Gross per 24 hour  Intake    480 ml  Output      0 ml  Net    480 ml     Physical Exam: General: Awake comfortable; sitting in chair HEENT: Normal Neck: supple. JVP 6-7. Carotids 2+ bilat; no bruits. No lymphadenopathy or thryomegaly appreciated. Cor: Mechanical heart sounds with LVAD hum present. Dressing c/d/i Lungs: Decreased at bases with mild crackles Abdomen: soft, nontender,  nondistended. + BS Driveline: C/D/I; securement device intact;  Extremities: no cyanosis, clubbing, rash, 1+edema  Neuro: awake alert. Pleasant. nonfocal  Telemetry: sinus 70-80s  Labs: Basic Metabolic Panel:  Recent Labs Lab 04/10/13 0400 04/11/13 0400 04/12/13 0400 04/13/13 0525 04/14/13 0545 04/15/13 0500 04/16/13 0500  NA 138 136 138 137 137 135 136  K 3.6 3.6 3.9 3.5 3.6 4.3 3.7  CL 103 101 100 97 99 99 100  CO2 28 29 30 31 29 28 26   GLUCOSE 97 98 84 85 86  78 89  BUN 10 9 8 10 10 8 7   CREATININE 0.82 0.86 0.74 0.80 0.88 0.75 0.86  CALCIUM 8.9 8.6 8.7 8.8 9.0 8.6 9.0  MG 2.1 1.8 1.8  --   --   --   --   PHOS 2.8 2.4 2.8  --   --   --   --     Liver Function Tests:  Recent Labs Lab 04/15/13 0500  AST 29  ALT 17  ALKPHOS 90  BILITOT 0.6  PROT 6.4  ALBUMIN 2.5*   No results found for this basename: LIPASE, AMYLASE,  in the last 168 hours No results found for this basename: AMMONIA,  in the last 168 hours  CBC:  Recent Labs Lab 04/10/13 0400 04/11/13 0400 04/12/13 0400 04/14/13 0545 04/15/13 0500  WBC 8.5 7.7 8.1 10.5 8.6  NEUTROABS 5.9 4.8 4.6  --   --   HGB 8.6* 8.8* 9.5* 9.1* 8.7*  HCT 26.0* 26.5* 27.9* 27.2* 26.7*  MCV 94.2 93.6 93.3 92.8 93.4  PLT 120* 135* 178 236 262    INR: 1.39  Recent Labs Lab 04/12/13 0400 04/13/13 0525 04/14/13 0545 04/15/13 0500 04/16/13 0500  INR 2.39* 2.45* 2.47* 2.53* 2.32*   Imaging: Dg Chest 2 View  04/15/2013   *RADIOLOGY REPORT*  Clinical Data: Heart failure with ventricular assist device placement  CHEST - 2 VIEW  Comparison: April 12, 2013  Findings:  There is persistent consolidation in the left base. Elsewhere, lungs clear.  Heart is enlarged with normal pulmonary vascularity.  Central catheter tip is in the superior vena cava near the cavoatrial junction.  No pneumothorax.  Left ventricular assist device remains in place.  Pacemaker leads are attached to the right atrium and right ventricle.  There is a hiatal hernia present.  IMPRESSION: Persistent left lower lobe consolidation.  Hiatal hernia present. Stable cardiomegaly.  Lungs elsewhere clear.   Original Report Authenticated By: Bretta Bang, M.D.    Medications:     Scheduled Medications: . amiodarone  200 mg Oral BID  . amLODipine  5 mg Oral Daily  . aspirin EC  325 mg Oral Daily  . bisacodyl  10 mg Oral Daily   Or  . bisacodyl  10 mg Rectal Daily  . carvedilol  6.25 mg Oral BID WC  . cholecalciferol   1,000 Units Oral Daily  . digoxin  0.125 mg Oral Daily  . docusate sodium  200 mg Oral Daily  . feeding supplement  237 mL Oral BID BM  . ferrous fumarate-b12-vitamic C-folic acid  1 capsule Oral TID PC  . hydrALAZINE  25 mg Oral Q8H  . losartan  100 mg Oral Daily  . oxymetazoline  1 spray Each Nare BID  . pantoprazole  40 mg Oral Daily  . potassium chloride  20 mEq Oral Daily  . sildenafil  40 mg Oral TID  . sodium chloride  3 mL Intravenous Q12H  . spironolactone  25 mg Oral Daily  . torsemide  20 mg Oral Daily  . warfarin  2.5 mg Oral ONCE-1800  . Warfarin - Pharmacist Dosing Inpatient   Does not apply q1800    Infusions:    PRN Medications: sodium chloride, acetaminophen, ALPRAZolam, bisacodyl, bisacodyl, guaiFENesin, hydrALAZINE, magnesium hydroxide, ondansetron (ZOFRAN) IV, traMADol, white petrolatum   Assessment:   1. Acute on chronic systolic HF due to NICM     --s/p HM II VAD on 04/06/13 2. PAH 3. Acute and chronic AF  - now in NSR 4. VT 5. Hypokalemia/hypomagnesemia 6. Epistaxis   Plan/Discussion:    POD # 10. Stable overnight and ambulating more with PT. No longer complaining of headaches. MAPs still elevated 90-100s, will increase hydralazine 25 TID and continue to monitor. No IMDUR d/t revatio.  No further epistaxis, however if she has another episode can cut ASA down to 81 mg and consider packing nose. INR therapeutic 2.3, pharmacy dosing.   LDH stable. Weight down 5 lbs, no PI events still LE edema 1+ will continue demadex 20 mg.   Meeting with VAD coordinator today to finish education. Still needs to build strength, continue to work with PT/OT. Goal home middle of next week.   I reviewed the LVAD parameters from today, and compared the results to the patient's prior recorded data.  No programming changes were made.  The LVAD is functioning within specified parameters.  The patient performs LVAD self-test daily.  LVAD interrogation was negative for any  significant power changes, alarms or PI events/speed drops.  LVAD equipment check completed and is in good working order.  Back-up equipment present.   LVAD education done on emergency procedures and precautions and reviewed exit site care.  Length of Stay: 38  Asucena Galer BensimhonMD 04/16/2013, 1:46 PM  Advanced Heart Failure Team Pager [phone] (M-F; 7a -  4p)  Please contact [facility] for night-coverage after hours (4p -7a ) and weekends on [email or web address]  VAD Team Pager [phone] (7am - 7am)  For VAD issues only

## 2013-04-16 NOTE — Progress Notes (Signed)
Occupational Therapy Treatment Patient Details Name: Brianna Hanson MRN: [id number] DOB: 1934-09-17 Today's Date: 04/16/2013 Time: 4098-1191 OT Time Calculation (min): 29 min  OT Assessment / Plan / Recommendation  History of present illness 78 year old female with heart failure due severe non-ischemic cardiomyopathy on milrinone and revatio scheduled for Heartmate II LVAD tomorrow for destination therapy   OT comments  Making excellent progress. Improving daily with endurance, increasing independence with ADL and appropriate use of LVAD equipment. At this time, pt will most likely be appropriate for D/C mid - late next week if medically stable.  Follow Up Recommendations  Home health OT    Barriers to Discharge       Equipment Recommendations  Other (comment);3 in 1 bedside comode    Recommendations for Other Services    Frequency Min 3X/week   Progress towards OT Goals Progress towards OT goals: Progressing toward goals  Plan Discharge plan remains appropriate    Precautions / Restrictions Precautions Precautions: Sternal;Fall Restrictions Weight Bearing Restrictions: Yes (Sternal Precautions)   Pertinent Vitals/Pain no apparent distress     ADL  Lower Body Dressing: Moderate assistance Transfers/Ambulation Related to ADLs: S. min vc to manage LVAD equiment ADL Comments: Beginning to use AE for LB ADL. minvc for switch to battery    OT Diagnosis:    OT Problem List:   OT Treatment Interventions:     OT Goals(current goals can now be found in the care plan section) Acute Rehab OT Goals Patient Stated Goal: to go home OT Goal Formulation: With patient Time For Goal Achievement: 04/22/13 Potential to Achieve Goals: Good ADL Goals Pt Will Perform Grooming: with modified independence;standing Pt Will Perform Upper Body Bathing: with supervision;with set-up;with caregiver independent in assisting;sitting Pt Will Perform Lower Body Bathing: with set-up;with  supervision;with caregiver independent in assisting;with adaptive equipment;sit to/from stand Pt Will Perform Upper Body Dressing: with supervision;with set-up;with caregiver independent in assisting;sitting Pt Will Transfer to Toilet: with supervision;ambulating;bedside commode Pt Will Perform Toileting - Clothing Manipulation and hygiene: with modified independence;sitting/lateral leans;sit to/from stand Additional ADL Goal #1: independent withsternal precautions during ADL and mobility for ADL  Visit Information  Last OT Received On: 04/16/13 Assistance Needed: +1 History of Present Illness: 78 year old female with heart failure due severe non-ischemic cardiomyopathy on milrinone and revatio scheduled for Heartmate II LVAD tomorrow for destination therapy    Subjective Data      Prior Functioning       Cognition  Cognition Arousal/Alertness: Awake/alert Behavior During Therapy: WFL for tasks assessed/performed Overall Cognitive Status: Within Functional Limits for tasks assessed    Mobility  Transfers Transfers: Sit to Stand;Stand to Sit Sit to Stand: 5: Supervision;Without upper extremity assist;From chair/3-in-1 Stand to Sit: 5: Supervision;To chair/3-in-1;Without upper extremity assist Details for Transfer Assistance: good safety awareness    Exercises  Other Exercises Other Exercises: squeeze ball  Other Exercises: general UB AROM   Balance     End of Session OT - End of Session Activity Tolerance: Patient tolerated treatment well Patient left: in chair;with call bell/phone within reach;with nursing/sitter in room Nurse Communication: Mobility status  GO     Rileyann Florance,HILLARY 04/16/2013, 10:01 AM Luisa Dago, OTR/L  [phone] 04/16/2013

## 2013-04-16 NOTE — Progress Notes (Signed)
ANTICOAGULATION CONSULT NOTE - Follow Up Consult  Pharmacy Consult for Warfarin Indication: atrial fibrillation  No Known Allergies  Patient Measurements: Height: 4' 11.84" (152 cm) Weight: 158 lb 1.1 oz (71.7 kg) IBW/kg (Calculated) : 45.14  Vital Signs: Temp: 98.7 F (37.1 C) (08/15 0431) Temp src: Oral (08/15 0431) Pulse Rate: 72 (08/15 0431)  Labs:  Recent Labs  04/14/13 0545 04/15/13 0500 04/16/13 0500  HGB 9.1* 8.7*  --   HCT 27.2* 26.7*  --   PLT 236 262  --   LABPROT 25.9* 26.4* 24.7*  INR 2.47* 2.53* 2.32*  CREATININE 0.88 0.75 0.86    Estimated Creatinine Clearance: 48.9 ml/min (by C-G formula based on Cr of 0.86).   Medications:  Prescriptions prior to admission  Medication Sig Dispense Refill  . amiodarone (PACERONE) 200 MG tablet Take 200 mg by mouth daily.      . Cholecalciferol (VITAMIN D3) 1000 UNITS CAPS Take 1 tablet by mouth daily.        . furosemide (LASIX) 80 MG tablet Take 80 mg by mouth 2 (two) times daily.      [name] losartan (COZAAR) 50 MG tablet Take 50 mg by mouth daily.      . milrinone (PRIMACOR) 20 MG/100ML SOLN infusion Inject 28.7625 mcg/min into the vein continuous.  100 mL  6  . potassium chloride SA (K-DUR,KLOR-CON) 20 MEQ tablet Take 20 mEq by mouth 2 (two) times daily.      . sildenafil (REVATIO) 20 MG tablet Take 60 mg by mouth 3 (three) times daily.      [name] warfarin (COUMADIN) 2.5 MG tablet Take 2.5 mg by mouth daily.        Assessment: 78 yo F with PMH of Afib and HF d/t severe NICM who presented for LVAD placement on 04/06/13. On warfarin PTA at 2.5 mg po daily. Warfarin was restarted on 8/6 (MD dosing) with BL INR of 1.44. Pharmacy consulted for follow up dosing.   Patient had an episode of epistaxis on 8/12 which lasted >15 mins but was stabilized with cauterization by ENT (pt states she has a h/o epistaxis on warfarin). Had some sporadic epistaxis yesterday but was relieved adequately. INR today therapeutic at 2.32. H/H  (8.7/26.7) slightly lower than bl on 8/14 (h/o chronic anemia).   Goal of Therapy:  INR 2-3 Monitor platelets by anticoagulation protocol: Yes   Plan:  - Warfarin 2.5 mg po x 1  - Monitor INR, CBC, s/s bleeding, clinical status daily   Margie Billet, PharmD Clinical Pharmacist - Resident Pager: [phone] Pharmacy: [phone] 04/16/2013 9:44 AM

## 2013-04-16 NOTE — Progress Notes (Signed)
CARDIAC REHAB PHASE I   PRE:  Rate/Rhythm: 80 SR    BP: sitting 96 MAP    SaO2: 94 RA  MODE:  Ambulation: 300 ft   POST:  Rate/Rhythm: 93     BP: sitting 100 MAP     SaO2: 87-93 RA  Pt progressing well. Used rollator and moving independently. Increased distance. Rest x1 at 150 ft with SaO2 87 RA however monitored rest of walk and maintained 89-94 RA. Return to recliner and pt connected to power module. Then pt ambulated to BR. Left pt in BR at her request. Will f/u. [phone]  Harriet Masson CES, ACSM 04/16/2013 11:12 AM

## 2013-04-16 NOTE — Progress Notes (Signed)
NUTRITION FOLLOW UP/CONSULT  Intervention:   Continue Ensure Complete po BID, each supplement provides 350 kcal and 13 grams of protein.  Nutrition Dx:   Inadequate oral intake now r/t variable appetite AEB pt report.  Goal:   Intake to meet >90% of estimated nutrition needs. Improving.  Monitor:   weight trends, lab trends, I/O's, PO intake, supplement tolerance  Assessment:   Admitted for LVAD implantation. Pt with left ventricular EF of 15% 2/2 severe non-ischemic cardiomyopathy.   Underwent the following procedures 8/5:  INSERTION OF IMPLANTABLE LEFT VENTRICULAR ASSIST DEVICE  PLACEMENT OF CENTRIMAG VENTRICULAR ASSIST DEVICE  INTRAOPERATIVE TRANSESOPHAGEAL ECHOCARDIOGRAM   RD consulted for falling Albumin. Pt is eating 50% most meals, drinking 2-3 Ensure shakes per day. Likely meeting nutrition needs. Albumin has a half-life of 21 days and is strongly affected by stress response and inflammatory process, therefore, do not expect to see an improvement in this lab value during acute hospitalization.   Height: Ht Readings from Last 1 Encounters:  04/05/13 4' 11.84" (1.52 m)    Weight Status:   Wt Readings from Last 1 Encounters:  04/16/13 158 lb 1.1 oz (71.7 kg)  trending down likely related to fluid status. Net -4 L this admission    Re-estimated needs:  Kcal: 1700 - 1900 Protein: 70 - 85 g Fluid: 1.7 - 1.9 liters daily  Skin:  Chest incision  Diet Order: Carbohydrate Modified Medium   Intake/Output Summary (Last 24 hours) at 04/16/13 1034 Last data filed at 04/16/13 0800  Gross per 24 hour  Intake    600 ml  Output    100 ml  Net    500 ml    Last BM: 8/10   Labs:   Recent Labs Lab 04/10/13 0400 04/11/13 0400 04/12/13 0400  04/14/13 0545 04/15/13 0500 04/16/13 0500  NA 138 136 138  < > 137 135 136  K 3.6 3.6 3.9  < > 3.6 4.3 3.7  CL 103 101 100  < > 99 99 100  CO2 28 29 30   < > 29 28 26   BUN 10 9 8   < > 10 8 7   CREATININE 0.82 0.86 0.74  < >  0.88 0.75 0.86  CALCIUM 8.9 8.6 8.7  < > 9.0 8.6 9.0  MG 2.1 1.8 1.8  --   --   --   --   PHOS 2.8 2.4 2.8  --   --   --   --   GLUCOSE 97 98 84  < > 86 78 89  < > = values in this interval not displayed.  CBG (last 3)   Recent Labs  04/13/13 2103  GLUCAP 117*    Scheduled Meds: . amiodarone  200 mg Oral BID  . amLODipine  5 mg Oral Daily  . aspirin EC  325 mg Oral Daily  . bisacodyl  10 mg Oral Daily   Or  . bisacodyl  10 mg Rectal Daily  . carvedilol  6.25 mg Oral BID WC  . cholecalciferol  1,000 Units Oral Daily  . digoxin  0.125 mg Oral Daily  . docusate sodium  200 mg Oral Daily  . feeding supplement  237 mL Oral BID BM  . ferrous fumarate-b12-vitamic C-folic acid  1 capsule Oral TID PC  . hydrALAZINE  25 mg Oral Q8H  . losartan  100 mg Oral Daily  . oxymetazoline  1 spray Each Nare BID  . pantoprazole  40 mg Oral Daily  . potassium  chloride  20 mEq Oral Daily  . sildenafil  40 mg Oral TID  . sodium chloride  3 mL Intravenous Q12H  . spironolactone  25 mg Oral Daily  . warfarin  2.5 mg Oral ONCE-1800  . Warfarin - Pharmacist Dosing Inpatient   Does not apply q1800    Continuous Infusions:   none    Clarene Duke RD, LDN Pager [phone] After Hours pager [phone]

## 2013-04-16 NOTE — Progress Notes (Signed)
  This NP to bedside for social visit and to inquire for needs.  Patient tells me she is "doing good" and feels in positive spirits. She is encouraged to call with questions of concerns.  PMT will continue to support holistically.  Lorinda Creed NP  Palliative Medicine Team Team Phone # [phone] Pager [phone]

## 2013-04-16 NOTE — Progress Notes (Signed)
Pt's home VAD equipment delivered to room - placed pt on home power module and home batteries with no equipment issues identified.  Verified back up controller programmed at fixed speed 9000 RPM and low speed limit 8400 RPM. Pt's home equipment delivered:   1.  Power module with patient power cable 2.  [employer] features editor 3.  Eight fully charged batteries 4.  Four battery clips 5.  Automobile power adapter  Transported pt via w/c to 2900 to meet and speak with pt who has upcoming VAD implant. Pt tolerated without difficulty.

## 2013-04-17 LAB — BASIC METABOLIC PANEL
BUN: 7 mg/dL (ref 6–23)
CO2: 26 mEq/L (ref 19–32)
Calcium: 8.9 mg/dL (ref 8.4–10.5)
Chloride: 100 mEq/L (ref 96–112)
Creatinine, Ser: 0.8 mg/dL (ref 0.50–1.10)
GFR calc Af Amer: 81 mL/min — ABNORMAL LOW (ref >90)
GFR calc non Af Amer: 70 mL/min — ABNORMAL LOW (ref >90)
Glucose, Bld: 89 mg/dL (ref 70–99)
Potassium: 3.8 mEq/L (ref 3.5–5.1)
Sodium: 137 mEq/L (ref 135–145)

## 2013-04-17 LAB — CBC
HCT: 29.3 % — ABNORMAL LOW (ref 36.0–46.0)
Hemoglobin: 9.9 g/dL — ABNORMAL LOW (ref 12.0–15.0)
MCH: 31.2 pg (ref 26.0–34.0)
MCHC: 33.8 g/dL (ref 30.0–36.0)
MCV: 92.4 fL (ref 78.0–100.0)
Platelets: 361 10*3/uL (ref 150–400)
RBC: 3.17 MIL/uL — ABNORMAL LOW (ref 3.87–5.11)
RDW: 17.6 % — ABNORMAL HIGH (ref 11.5–15.5)
WBC: 11 10*3/uL — ABNORMAL HIGH (ref 4.0–10.5)

## 2013-04-17 LAB — PROTIME-INR
INR: 2.44 — ABNORMAL HIGH (ref 0.00–1.49)
Prothrombin Time: 25.7 seconds — ABNORMAL HIGH (ref 11.6–15.2)

## 2013-04-17 LAB — LACTATE DEHYDROGENASE: LDH: 436 U/L — ABNORMAL HIGH (ref 94–250)

## 2013-04-17 MED ORDER — WARFARIN SODIUM 2.5 MG PO TABS
2.5000 mg | ORAL_TABLET | Freq: Once | ORAL | Status: AC
  Administered 2013-04-17: 2.5 mg via ORAL
  Filled 2013-04-17: qty 1

## 2013-04-17 MED ORDER — SILDENAFIL CITRATE 20 MG PO TABS
20.0000 mg | ORAL_TABLET | Freq: Three times a day (TID) | ORAL | Status: DC
  Administered 2013-04-17 – 2013-04-19 (×9): 20 mg via ORAL
  Filled 2013-04-17 (×12): qty 1

## 2013-04-17 MED ORDER — AMLODIPINE BESYLATE 10 MG PO TABS
10.0000 mg | ORAL_TABLET | Freq: Every day | ORAL | Status: DC
  Administered 2013-04-17 – 2013-04-22 (×6): 10 mg via ORAL
  Filled 2013-04-17 (×6): qty 1

## 2013-04-17 MED ORDER — TORSEMIDE 20 MG PO TABS
20.0000 mg | ORAL_TABLET | ORAL | Status: DC
  Administered 2013-04-18 – 2013-04-22 (×3): 20 mg via ORAL
  Filled 2013-04-17 (×3): qty 1

## 2013-04-17 NOTE — Progress Notes (Signed)
On 8/15 explained to pt and husband that we needed to have the hat in the toilet to measure the urine output. Asked them to please leave the hat in the toilet, so we could measure the urine.  However, hat found out of the toilet today and in the trash can.  On 04/18/03, explained to patient that we needed to leave the hat in the toilet to measure her urine. Her husband was not present at the time. Will continue to keep hat in the toilet to measure urine output and explain to pt and family importance of doing so.

## 2013-04-17 NOTE — Progress Notes (Signed)
ANTICOAGULATION CONSULT NOTE - Follow Up Consult  Pharmacy Consult for Warfarin Indication: atrial fibrillation  No Known Allergies  Patient Measurements: Height: 4' 11.84" (152 cm) Weight: 155 lb 3.3 oz (70.4 kg) IBW/kg (Calculated) : 45.14  Vital Signs: Temp: 98.5 F (36.9 C) (08/16 0730) Temp src: Oral (08/16 0730) Pulse Rate: 71 (08/15 2343)  Labs:  Recent Labs  04/15/13 0500 04/16/13 0500 04/17/13 0500  HGB 8.7*  --  9.9*  HCT 26.7*  --  29.3*  PLT 262  --  361  LABPROT 26.4* 24.7* 25.7*  INR 2.53* 2.32* 2.44*  CREATININE 0.75 0.86 0.80    Estimated Creatinine Clearance: 52.1 ml/min (by C-G formula based on Cr of 0.8).   Medications:  Prescriptions prior to admission  Medication Sig Dispense Refill  . amiodarone (PACERONE) 200 MG tablet Take 200 mg by mouth daily.      . Cholecalciferol (VITAMIN D3) 1000 UNITS CAPS Take 1 tablet by mouth daily.        . furosemide (LASIX) 80 MG tablet Take 80 mg by mouth 2 (two) times daily.      [name] losartan (COZAAR) 50 MG tablet Take 50 mg by mouth daily.      . milrinone (PRIMACOR) 20 MG/100ML SOLN infusion Inject 28.7625 mcg/min into the vein continuous.  100 mL  6  . potassium chloride SA (K-DUR,KLOR-CON) 20 MEQ tablet Take 20 mEq by mouth 2 (two) times daily.      . sildenafil (REVATIO) 20 MG tablet Take 60 mg by mouth 3 (three) times daily.      [name] warfarin (COUMADIN) 2.5 MG tablet Take 2.5 mg by mouth daily.        Assessment: 78 yo F with PMH of Afib and HF d/t severe NICM who presented for LVAD placement on 04/06/13. On warfarin PTA at 2.5 mg po daily. Warfarin was restarted on 8/6 (MD dosing) with BL INR of 1.44. Pharmacy consulted for follow up dosing.   Patient had an episode of epistaxis on 8/12 which lasted >15 mins but was stabilized with cauterization by ENT (pt states she has a h/o epistaxis on warfarin). Had some sporadic epistaxis on 8/14 but resolved adequately. INR today therapeutic at 2.44. H/H (9.9/29.3)  increased from 8/14 (h/o chronic anemia). No s/s of bleeding noted.   Goal of Therapy:  INR 2-3 Monitor platelets by anticoagulation protocol: Yes   Plan:  - Warfarin 2.5 mg po x 1  - Monitor INR, CBC, s/s bleeding, clinical status daily   Margie Billet, PharmD Clinical Pharmacist - Resident Pager: [phone] Pharmacy: [phone] 04/17/2013 9:40 AM

## 2013-04-17 NOTE — Progress Notes (Signed)
Pt. Refused to walk. She stated she feel tired and want rest.

## 2013-04-17 NOTE — Progress Notes (Signed)
CARDIAC REHAB PHASE I   PRE:  Rate/Rhythm: 73SR  BP:  Supine:   Sitting: 88/dopplered  Standing:    SaO2: 98%RA  MODE:  Ambulation: 300 ft   POST:  Rate/Rhythm: 83  BP:  Supine:   Sitting: 88/dopplered  Standing:    SaO2: 90-91%RA 1245-1325 Pt walked 300 ft on RA with rollator with slow steady gait. Stopped once to rest. To bed after walk. Tired. Pt able to put self on battery pack on off with assistance. C/o hands weak today. Family member very helpful and supportive.   Luetta Nutting, RN BSN  04/17/2013 1:21 PM

## 2013-04-17 NOTE — Progress Notes (Signed)
0845 Came to walk with pt. Stated she would like to wait due to headache. Will follow up. Luetta Nutting RNBSN

## 2013-04-17 NOTE — Progress Notes (Addendum)
Patient ID: Brianna Hanson, female   DOB: October 19, 1934, 78 y.o.   MRN: [id number] HeartMate 2 Rounding Note  Subjective:    78 y/o female with severe CHF due to NICM, PAH, chronic AF underwent HM II VAD implant 04/06/13  POD #11. Doesn't feel well this am. Got demadex at 3p yesterday and was up and down to the bathroom many times at night. + headache and mild abdominal pain.  Walked 3 laps around circle yesterday. MAPs remain elevated 90-100s. Weight down another 3 lbs      INR 2.4 LDH 558-> 419 -> 429-> 409>811>914  LVAD INTERROGATION:  HeartMate II LVAD:  Flow -- liters/min, speed 8980, power 4.7, PI 7.1  No PI events in last 24 hours   Objective:    Vital Signs:   Temp:  [98.1 F (36.7 C)-98.5 F (36.9 C)] 98.5 F (36.9 C) (08/16 0730) Pulse Rate:  [71] 71 (08/15 2343) Resp:  [19] 19 (08/15 2343) SpO2:  [96 %-97 %] 97 % (08/16 0628) Weight:  [70.4 kg (155 lb 3.3 oz)] 70.4 kg (155 lb 3.3 oz) (08/16 0628) Last BM Date: 04/15/13 Mean arterial Pressure 90-100s  Intake/Output:   Intake/Output Summary (Last 24 hours) at 04/17/13 0801 Last data filed at 04/17/13 0011  Gross per 24 hour  Intake    120 ml  Output   1950 ml  Net  -1830 ml     Physical Exam: General: Awake comfortable; sitting in bed. fatigued HEENT: Normal Neck: supple. JVP 5 . Carotids 2+ bilat; no bruits. No lymphadenopathy or thryomegaly appreciated. Cor: Mechanical heart sounds with LVAD hum present. Dressing c/d/i Lungs: Decreased at bases with mild crackles Abdomen: soft, nondistended. + BS. Mildly tender in LUQ. No rebound guarding. Driveline site ok.  Driveline: C/D/I; securement device intact;  Extremities: no cyanosis, clubbing, rash, noedema  Neuro: awake alert. Pleasant. nonfocal  Telemetry: sinus 70-80s  Labs: Basic Metabolic Panel:  Recent Labs Lab 04/11/13 0400 04/12/13 0400 04/13/13 0525 04/14/13 0545 04/15/13 0500 04/16/13 0500 04/17/13 0500  NA 136 138 137 137 135 136 137  K 3.6  3.9 3.5 3.6 4.3 3.7 3.8  CL 101 100 97 99 99 100 100  CO2 29 30 31 29 28 26 26   GLUCOSE 98 84 85 86 78 89 89  BUN 9 8 10 10 8 7 7   CREATININE 0.86 0.74 0.80 0.88 0.75 0.86 0.80  CALCIUM 8.6 8.7 8.8 9.0 8.6 9.0 8.9  MG 1.8 1.8  --   --   --   --   --   PHOS 2.4 2.8  --   --   --   --   --     Liver Function Tests:  Recent Labs Lab 04/15/13 0500  AST 29  ALT 17  ALKPHOS 90  BILITOT 0.6  PROT 6.4  ALBUMIN 2.5*   No results found for this basename: LIPASE, AMYLASE,  in the last 168 hours No results found for this basename: AMMONIA,  in the last 168 hours  CBC:  Recent Labs Lab 04/11/13 0400 04/12/13 0400 04/14/13 0545 04/15/13 0500 04/17/13 0500  WBC 7.7 8.1 10.5 8.6 11.0*  NEUTROABS 4.8 4.6  --   --   --   HGB 8.8* 9.5* 9.1* 8.7* 9.9*  HCT 26.5* 27.9* 27.2* 26.7* 29.3*  MCV 93.6 93.3 92.8 93.4 92.4  PLT 135* 178 236 262 361    INR: 1.39  Recent Labs Lab 04/13/13 0525 04/14/13 0545 04/15/13 0500 04/16/13 0500 04/17/13  0500  INR 2.45* 2.47* 2.53* 2.32* 2.44*   Imaging: Dg Chest 2 View  04/15/2013   *RADIOLOGY REPORT*  Clinical Data: Heart failure with ventricular assist device placement  CHEST - 2 VIEW  Comparison: April 12, 2013  Findings:  There is persistent consolidation in the left base. Elsewhere, lungs clear.  Heart is enlarged with normal pulmonary vascularity.  Central catheter tip is in the superior vena cava near the cavoatrial junction.  No pneumothorax.  Left ventricular assist device remains in place.  Pacemaker leads are attached to the right atrium and right ventricle.  There is a hiatal hernia present.  IMPRESSION: Persistent left lower lobe consolidation.  Hiatal hernia present. Stable cardiomegaly.  Lungs elsewhere clear.   Original Report Authenticated By: Bretta Bang, M.D.    Medications:     Scheduled Medications: . amiodarone  200 mg Oral BID  . amLODipine  5 mg Oral Daily  . aspirin EC  325 mg Oral Daily  . bisacodyl  10 mg  Oral Daily   Or  . bisacodyl  10 mg Rectal Daily  . carvedilol  6.25 mg Oral BID WC  . cholecalciferol  1,000 Units Oral Daily  . digoxin  0.125 mg Oral Daily  . docusate sodium  200 mg Oral Daily  . feeding supplement  237 mL Oral BID BM  . ferrous fumarate-b12-vitamic C-folic acid  1 capsule Oral TID PC  . hydrALAZINE  25 mg Oral Q8H  . losartan  100 mg Oral Daily  . oxymetazoline  1 spray Each Nare BID  . pantoprazole  40 mg Oral Daily  . potassium chloride  20 mEq Oral Daily  . sildenafil  40 mg Oral TID  . sodium chloride  3 mL Intravenous Q12H  . spironolactone  25 mg Oral Daily  . torsemide  20 mg Oral Daily  . Warfarin - Pharmacist Dosing Inpatient   Does not apply q1800    Infusions:    PRN Medications: sodium chloride, acetaminophen, ALPRAZolam, bisacodyl, bisacodyl, guaiFENesin, hydrALAZINE, magnesium hydroxide, ondansetron (ZOFRAN) IV, sodium chloride, traMADol, white petrolatum   Assessment:   1. Acute on chronic systolic HF due to NICM     --s/p HM II VAD on 04/06/13 2. PAH 3. Acute and chronic AF  - now in NSR 4. VT 5. Hypokalemia/hypomagnesemia 6. Epistaxis - resolved 7. Ab pain   Plan/Discussion:    POD # 11. Stable overnight and ambulating more with PT. MAPs still elevated 90-100s, will increase amlodipine 10 daily.  INR therapeutic 2.4, pharmacy dosing. Will cut Revatio to 20 TID. Continue amio at 200 bid for now.  LDH stable. Weight down 5 lbs, no PI events. Will change demadex to every other day.   Not sure what ab pain is related to - she says it started when getting up and down. Will continue to follow.  Change labs to every other day.  Continue to ambulate and educate.    I reviewed the LVAD parameters from today, and compared the results to the patient's prior recorded data.  No programming changes were made.  The LVAD is functioning within specified parameters.  The patient performs LVAD self-test daily.  LVAD interrogation was negative for any  significant power changes, alarms or PI events/speed drops.  LVAD equipment check completed and is in good working order.  Back-up equipment present.   LVAD education done on emergency procedures and precautions and reviewed exit site care.  Length of Stay: 64  Daniel BensimhonMD 04/17/2013, 8:01  AM  Advanced Heart Failure Team Pager [phone] (M-F; 7a - 4p)  Please contact [facility] for night-coverage after hours (4p -7a ) and weekends on [email or web address]  VAD Team Pager [phone] (7am - 7am)  For VAD issues only

## 2013-04-18 ENCOUNTER — Inpatient Hospital Stay (HOSPITAL_COMMUNITY): Payer: Medicare Other

## 2013-04-18 DIAGNOSIS — Z95818 Presence of other cardiac implants and grafts: Secondary | ICD-10-CM

## 2013-04-18 DIAGNOSIS — I5023 Acute on chronic systolic (congestive) heart failure: Secondary | ICD-10-CM

## 2013-04-18 DIAGNOSIS — R109 Unspecified abdominal pain: Secondary | ICD-10-CM

## 2013-04-18 LAB — URINE MICROSCOPIC-ADD ON

## 2013-04-18 LAB — CBC
HCT: 28.7 % — ABNORMAL LOW (ref 36.0–46.0)
Hemoglobin: 9.6 g/dL — ABNORMAL LOW (ref 12.0–15.0)
MCH: 31.1 pg (ref 26.0–34.0)
MCHC: 33.4 g/dL (ref 30.0–36.0)
MCV: 92.9 fL (ref 78.0–100.0)
Platelets: 375 10*3/uL (ref 150–400)
RBC: 3.09 MIL/uL — ABNORMAL LOW (ref 3.87–5.11)
RDW: 17.9 % — ABNORMAL HIGH (ref 11.5–15.5)
WBC: 10.9 10*3/uL — ABNORMAL HIGH (ref 4.0–10.5)

## 2013-04-18 LAB — BASIC METABOLIC PANEL
BUN: 8 mg/dL (ref 6–23)
CO2: 26 mEq/L (ref 19–32)
Calcium: 9.1 mg/dL (ref 8.4–10.5)
Chloride: 100 mEq/L (ref 96–112)
Creatinine, Ser: 0.79 mg/dL (ref 0.50–1.10)
GFR calc Af Amer: >90 mL/min (ref >90)
GFR calc non Af Amer: 79 mL/min — ABNORMAL LOW (ref >90)
Glucose, Bld: 95 mg/dL (ref 70–99)
Potassium: 4 mEq/L (ref 3.5–5.1)
Sodium: 134 mEq/L — ABNORMAL LOW (ref 135–145)

## 2013-04-18 LAB — URINALYSIS, ROUTINE W REFLEX MICROSCOPIC
Bilirubin Urine: NEGATIVE
Glucose, UA: NEGATIVE mg/dL
Hgb urine dipstick: NEGATIVE
Ketones, ur: NEGATIVE mg/dL
Nitrite: NEGATIVE
Protein, ur: 30 mg/dL — AB
Specific Gravity, Urine: 1.014 (ref 1.005–1.030)
Urobilinogen, UA: 2 mg/dL — ABNORMAL HIGH (ref 0.0–1.0)
pH: 8 (ref 5.0–8.0)

## 2013-04-18 LAB — PROTIME-INR
INR: 2.28 — ABNORMAL HIGH (ref 0.00–1.49)
Prothrombin Time: 24.4 seconds — ABNORMAL HIGH (ref 11.6–15.2)

## 2013-04-18 MED ORDER — FENTANYL CITRATE 0.05 MG/ML IJ SOLN
25.0000 ug | INTRAMUSCULAR | Status: DC | PRN
  Administered 2013-04-18 – 2013-04-21 (×12): 25 ug via INTRAVENOUS
  Filled 2013-04-18 (×12): qty 2

## 2013-04-18 MED ORDER — IOHEXOL 300 MG/ML  SOLN
25.0000 mL | INTRAMUSCULAR | Status: AC
  Administered 2013-04-18 (×2): 25 mL via ORAL

## 2013-04-18 MED ORDER — SODIUM CHLORIDE 0.9 % IV SOLN
INTRAVENOUS | Status: DC
  Administered 2013-04-18: 16:00:00 via INTRAVENOUS
  Administered 2013-04-19: 500 mL via INTRAVENOUS
  Administered 2013-04-20: 04:00:00 via INTRAVENOUS

## 2013-04-18 MED ORDER — VANCOMYCIN HCL IN DEXTROSE 750-5 MG/150ML-% IV SOLN
750.0000 mg | Freq: Two times a day (BID) | INTRAVENOUS | Status: DC
  Filled 2013-04-18 (×2): qty 150

## 2013-04-18 MED ORDER — PIPERACILLIN-TAZOBACTAM 3.375 G IVPB
3.3750 g | Freq: Three times a day (TID) | INTRAVENOUS | Status: DC
  Filled 2013-04-18 (×3): qty 50

## 2013-04-18 MED ORDER — WARFARIN SODIUM 2.5 MG PO TABS
2.5000 mg | ORAL_TABLET | Freq: Once | ORAL | Status: AC
  Administered 2013-04-18: 2.5 mg via ORAL
  Filled 2013-04-18: qty 1

## 2013-04-18 MED ORDER — IOHEXOL 300 MG/ML  SOLN
100.0000 mL | Freq: Once | INTRAMUSCULAR | Status: AC | PRN
  Administered 2013-04-18: 100 mL via INTRAVENOUS

## 2013-04-18 NOTE — Progress Notes (Signed)
Patient ID: Brianna Hanson, female   DOB: August 29, 1935, 78 y.o.   MRN: [id number] Heart[facility] 2 Rounding Note  Subjective:    78 y/o female with severe CHF due to NICM, PAH, chronic AF underwent HM II VAD implant 04/06/13  POD #12. Not feeling well. + Pain in LUQ. Weak. + chills. No fever. Labs pending. Denies dysuria or bloody stools. Driveline exit site ok but does have some tenderness along course. MAPs still 95-100 despite ongoing titration of anti-HTN meds  LVAD INTERROGATION:  HeartMate II LVAD:  Flow 4.5 liters/min, speed 8980, power 5.0, PI 7.0  No PI events in last 24 hours   Objective:    Vital Signs:   Temp:  [97.6 F (36.4 C)-98.7 F (37.1 C)] 98.4 F (36.9 C) (08/17 0845) Pulse Rate:  [75-79] 75 (08/17 0542) SpO2:  [95 %-99 %] 99 % (08/17 0542) Weight:  [70.761 kg (156 lb)-70.8 kg (156 lb 1.4 oz)] 70.761 kg (156 lb) (08/17 0542) Last BM Date: 04/15/13 Mean arterial Pressure 90-100s  Intake/Output:   Intake/Output Summary (Last 24 hours) at 04/18/13 1049 Last data filed at 04/18/13 1000  Gross per 24 hour  Intake    320 ml  Output    200 ml  Net    120 ml     Physical Exam: General:  sitting in bed. fatigued HEENT: Normal Neck: supple. JVP 5 . Carotids 2+ bilat; no bruits. No lymphadenopathy or thryomegaly appreciated. Cor: Mechanical heart sounds with LVAD hum present. Dressing c/d/i Lungs: Decreased at bases with mild crackles Abdomen: soft, nondistended. + BS. Mildly tender in LUQ extending down toward umbilicus. + mild guarding. Cannot exclude early rebound. Driveline: C/D/I; securement device intact;  Extremities: no cyanosis, clubbing, rash, no edema  Neuro: awake alert. Pleasant. nonfocal  Telemetry: sinus 70-80s  Labs: Basic Metabolic Panel:  Recent Labs Lab 04/12/13 0400 04/13/13 0525 04/14/13 0545 04/15/13 0500 04/16/13 0500 04/17/13 0500  NA 138 137 137 135 136 137  K 3.9 3.5 3.6 4.3 3.7 3.8  CL 100 97 99 99 100 100  CO2 30 31 29 28 26 26    GLUCOSE 84 85 86 78 89 89  BUN 8 10 10 8 7 7   CREATININE 0.74 0.80 0.88 0.75 0.86 0.80  CALCIUM 8.7 8.8 9.0 8.6 9.0 8.9  MG 1.8  --   --   --   --   --   PHOS 2.8  --   --   --   --   --     Liver Function Tests:  Recent Labs Lab 04/15/13 0500  AST 29  ALT 17  ALKPHOS 90  BILITOT 0.6  PROT 6.4  ALBUMIN 2.5*   No results found for this basename: LIPASE, AMYLASE,  in the last 168 hours No results found for this basename: AMMONIA,  in the last 168 hours  CBC:  Recent Labs Lab 04/12/13 0400 04/14/13 0545 04/15/13 0500 04/17/13 0500  WBC 8.1 10.5 8.6 11.0*  NEUTROABS 4.6  --   --   --   HGB 9.5* 9.1* 8.7* 9.9*  HCT 27.9* 27.2* 26.7* 29.3*  MCV 93.3 92.8 93.4 92.4  PLT 178 236 262 361    INR: 1.39  Recent Labs Lab 04/13/13 0525 04/14/13 0545 04/15/13 0500 04/16/13 0500 04/17/13 0500  INR 2.45* 2.47* 2.53* 2.32* 2.44*   Imaging: No results found.  Medications:     Scheduled Medications: . amiodarone  200 mg Oral BID  . amLODipine  10 mg Oral  Daily  . aspirin EC  325 mg Oral Daily  . bisacodyl  10 mg Oral Daily   Or  . bisacodyl  10 mg Rectal Daily  . carvedilol  6.25 mg Oral BID WC  . cholecalciferol  1,000 Units Oral Daily  . digoxin  0.125 mg Oral Daily  . docusate sodium  200 mg Oral Daily  . feeding supplement  237 mL Oral BID BM  . ferrous fumarate-b12-vitamic C-folic acid  1 capsule Oral TID PC  . hydrALAZINE  25 mg Oral Q8H  . iohexol  25 mL Oral Q1 Hr x 2  . losartan  100 mg Oral Daily  . oxymetazoline  1 spray Each Nare BID  . pantoprazole  40 mg Oral Daily  . potassium chloride  20 mEq Oral Daily  . sildenafil  20 mg Oral TID  . sodium chloride  3 mL Intravenous Q12H  . spironolactone  25 mg Oral Daily  . torsemide  20 mg Oral QODAY  . Warfarin - Pharmacist Dosing Inpatient   Does not apply q1800    Infusions:    PRN Medications: sodium chloride, acetaminophen, ALPRAZolam, bisacodyl, bisacodyl, guaiFENesin, hydrALAZINE,  magnesium hydroxide, ondansetron (ZOFRAN) IV, sodium chloride, traMADol, white petrolatum   Assessment:   1. Acute on chronic systolic HF due to NICM     --s/p HM II VAD on 04/06/13 2. PAH 3. Acute and chronic AF  - now in NSR 4. VT 5. Hypokalemia/hypomagnesemia 6. Epistaxis - resolved 7. Ab pain   Plan/Discussion:    POD # 12. I am worried about what is going on in her belly. WBC was climbing yesterday. Will get CT of lower chest, abdomen and pelvis ASAP. Check UA and UCx. Labs being drawn now including BCx x2. Suspect she may need GSU to see - await results of CT. Low threshold to start abx once cultures are drawn.   MAP still high but will hold off on titrating anti-HTN further just yet until I see results of CT.    I reviewed the LVAD parameters from today, and compared the results to the patient's prior recorded data.  No programming changes were made.  The LVAD is functioning within specified parameters.  The patient performs LVAD self-test daily.  LVAD interrogation was negative for any significant power changes, alarms or PI events/speed drops.  LVAD equipment check completed and is in good working order.  Back-up equipment present.   LVAD education done on emergency procedures and precautions and reviewed exit site care.  Length of Stay: 2  Joli Koob BensimhonMD 04/18/2013, 10:49 AM  Advanced Heart Failure Team Pager [phone] (M-F; 7a - 4p)  Please contact [facility] for night-coverage after hours (4p -7a ) and weekends on [email or web address]  VAD Team Pager [phone] (7am - 7am)  For VAD issues only

## 2013-04-18 NOTE — Progress Notes (Signed)
Patient ID: Brianna Hanson, female   DOB: 1935-07-16, 78 y.o.   MRN: [id number] HeartMate 2 Rounding Note  Subjective:    POD #12VAD for nonischemic  Cardiomyopathy  Patient developed upper abdominal pain early this a.m.  Left greater than right No associated GI symptoms such as nausea vomiting or diarrhea Urinalysis unremarkable, white count 11,000, no associated fever On exam surgical incisions are all well-healed Tenderness in left upper quadrant greater than right upper quadrant, most tenderness over left lower rib cage in the area the pump pocket  CT of abdomen reviewed with radiologist No evidence of air bubbles or infection in the area of the pump No evidence of colonic or other GI pathology no abscess Small left rectus hematoma noted Small left pleural effusion noted  Patient started on broad-spectrum antibiotics and will observe This may be related to musculoskeletal pain from the pump pocket and increased activity yesterday when she visited [address]  [address] to speak to another LVAD patient. We'll also check amylase and liver panel in a.m.  No PI events overnight Mean arterial pressure has been elevated and meds are being adjusted  LVAD INTERROGATION:  HeartMate II LVAD:  Flow 5.0 liters/min, speed 9000, power 4.2, PI 5.8.    Objective:    Vital Signs:   Temp:  [98 F (36.7 C)-98.7 F (37.1 C)] 98.3 F (36.8 C) (08/17 1345) Pulse Rate:  [75-79] 75 (08/17 0542) SpO2:  [95 %-99 %] 99 % (08/17 0542) Weight:  [156 lb (70.761 kg)-156 lb 1.4 oz (70.8 kg)] 156 lb (70.761 kg) (08/17 0542) Last BM Date: 04/15/13 Mean arterial Pressure   MAP 100 Intake/Output:   Intake/Output Summary (Last 24 hours) at 04/18/13 1457 Last data filed at 04/18/13 1200  Gross per 24 hour  Intake    100 ml  Output    375 ml  Net   -275 ml     Physical Exam: General:  Well appearing. No resp difficulty HEENT: normal Neck: supple.  Cor:  LVAD hum present. I don't hear valve sounds Lungs:  decreased in bases Abdomen: Upper abdominal tenderness, mildly distended. No hepatosplenomegaly. No bruits or masses. Diminished  sounds. Driveline: C/D/I; securement device intact and driveline incorporated Extremities: no cyanosis, mild  ankle edema Neuro: alert & orientedx3, cranial nerves grossly intact. moves all 4 extremities w/o difficulty. Affect pleasant  chest and abdominal incision is well-healed  Telemetry: [id number]  Labs: Basic Metabolic Panel:  Recent Labs Lab 04/12/13 0400  04/14/13 0545 04/15/13 0500 04/16/13 0500 04/17/13 0500 04/18/13 1040  NA 138  < > 137 135 136 137 134*  K 3.9  < > 3.6 4.3 3.7 3.8 4.0  CL 100  < > 99 99 100 100 100  CO2 30  < > 29 28 26 26 26   GLUCOSE 84  < > 86 78 89 89 95  BUN 8  < > 10 8 7 7 8   CREATININE 0.74  < > 0.88 0.75 0.86 0.80 0.79  CALCIUM 8.7  < > 9.0 8.6 9.0 8.9 9.1  MG 1.8  --   --   --   --   --   --   PHOS 2.8  --   --   --   --   --   --   < > = values in this interval not displayed.  Liver Function Tests:  Recent Labs Lab 04/15/13 0500  AST 29  ALT 17  ALKPHOS 90  BILITOT 0.6  PROT 6.4  ALBUMIN 2.5*   No results found for this basename: LIPASE, AMYLASE,  in the last 168 hours No results found for this basename: AMMONIA,  in the last 168 hours  CBC:  Recent Labs Lab 04/12/13 0400 04/14/13 0545 04/15/13 0500 04/17/13 0500 04/18/13 1040  WBC 8.1 10.5 8.6 11.0* 10.9*  NEUTROABS 4.6  --   --   --   --   HGB 9.5* 9.1* 8.7* 9.9* 9.6*  HCT 27.9* 27.2* 26.7* 29.3* 28.7*  MCV 93.3 92.8 93.4 92.4 92.9  PLT 178 236 262 361 375    INR:2.47  Recent Labs Lab 04/14/13 0545 04/15/13 0500 04/16/13 0500 04/17/13 0500 04/18/13 1040  INR 2.47* 2.53* 2.32* 2.44* 2.28*   LDH 392 <547 <550< 423 Coox 71%  Pulse ox sat 96%  Other results:  EKG:   Imaging: No results found.   Medications:     Scheduled Medications: . amiodarone  200 mg Oral BID  . amLODipine  10 mg Oral Daily  . aspirin EC  325 mg  Oral Daily  . bisacodyl  10 mg Oral Daily   Or  . bisacodyl  10 mg Rectal Daily  . carvedilol  6.25 mg Oral BID WC  . cholecalciferol  1,000 Units Oral Daily  . digoxin  0.125 mg Oral Daily  . docusate sodium  200 mg Oral Daily  . feeding supplement  237 mL Oral BID BM  . ferrous fumarate-b12-vitamic C-folic acid  1 capsule Oral TID PC  . hydrALAZINE  25 mg Oral Q8H  . losartan  100 mg Oral Daily  . oxymetazoline  1 spray Each Nare BID  . pantoprazole  40 mg Oral Daily  . piperacillin-tazobactam (ZOSYN)  IV  3.375 g Intravenous Q8H  . potassium chloride  20 mEq Oral Daily  . sildenafil  20 mg Oral TID  . sodium chloride  3 mL Intravenous Q12H  . spironolactone  25 mg Oral Daily  . torsemide  20 mg Oral QODAY  . vancomycin  750 mg Intravenous Q12H  . warfarin  2.5 mg Oral ONCE-1800  . Warfarin - Pharmacist Dosing Inpatient   Does not apply q1800    Infusions:   maintenance IV fluids to start   PRN Medications: sodium chloride, acetaminophen, ALPRAZolam, bisacodyl, bisacodyl, guaiFENesin, hydrALAZINE, magnesium hydroxide, ondansetron (ZOFRAN) IV, sodium chloride, traMADol, white petrolatum   Assessment   New onset left upper quadrant left chest wall  Pain and tenderness without significant abnormalities on CT scan. Antibiotics started prophylactically for possible pocket infection We'll hold off on general surgery consult for now Increase pain medication to help reduce mean arterial pressure Continue with normal anticoagulation goal.     Plan/Discussion:   Continue close observation, followup cultures and continue prophylactic antibiotics for possible pump pocket infection. Start gentle IV hydration of her by mouth while oral ntake is poor.   I reviewed the LVAD parameters from today, and compared the results to the patient's prior recorded data.  No programming changes were made.  The LVAD is functioning within specified parameters.  The patient performs LVAD self-test  daily.  LVAD interrogation was negative for any significant power changes, alarms or PI events/speed drops.  LVAD equipment check completed and is in good working order.  Back-up equipment present.   LVAD education done on emergency procedures and precautions and reviewed exit site care.  Length of Stay: 13  VAN TRIGT III,PETER 04/18/2013, 2:57 PM  VAD Team Pager [phone] (7am - 7am)

## 2013-04-18 NOTE — Progress Notes (Addendum)
ANTICOAGULATION and ANTIBIOTIC CONSULT NOTE - Follow Up Consult  Pharmacy Consult for Warfarin and Vancomycin + Zosyn Indication: atrial fibrillation and r/o bacteremia  No Known Allergies  Patient Measurements: Height: 4' 11.84" (152 cm) Weight: 156 lb (70.761 kg) IBW/kg (Calculated) : 45.14  Vital Signs: Temp: 98.3 F (36.8 C) (08/17 1345) Temp src: Oral (08/17 1345) Pulse Rate: 75 (08/17 0542)  Labs:  Recent Labs  04/16/13 0500 04/17/13 0500 04/18/13 1040  HGB  --  9.9* 9.6*  HCT  --  29.3* 28.7*  PLT  --  361 375  LABPROT 24.7* 25.7* 24.4*  INR 2.32* 2.44* 2.28*  CREATININE 0.86 0.80 0.79    Estimated Creatinine Clearance: 52.3 ml/min (by C-G formula based on Cr of 0.79).   Medications:  Prescriptions prior to admission  Medication Sig Dispense Refill  . amiodarone (PACERONE) 200 MG tablet Take 200 mg by mouth daily.      . Cholecalciferol (VITAMIN D3) 1000 UNITS CAPS Take 1 tablet by mouth daily.        . furosemide (LASIX) 80 MG tablet Take 80 mg by mouth 2 (two) times daily.      [name] losartan (COZAAR) 50 MG tablet Take 50 mg by mouth daily.      . milrinone (PRIMACOR) 20 MG/100ML SOLN infusion Inject 28.7625 mcg/min into the vein continuous.  100 mL  6  . potassium chloride SA (K-DUR,KLOR-CON) 20 MEQ tablet Take 20 mEq by mouth 2 (two) times daily.      . sildenafil (REVATIO) 20 MG tablet Take 60 mg by mouth 3 (three) times daily.      [name] warfarin (COUMADIN) 2.5 MG tablet Take 2.5 mg by mouth daily.        Assessment: 78 yo F with PMH of Afib and HF d/t severe NICM who presented for LVAD placement on 04/06/13.   WARFARIN: On warfarin PTA at 2.5 mg po daily. Warfarin was restarted on 8/6 (MD dosing) with BL INR of 1.44. Pharmacy consulted for follow up dosing.   Patient had an episode of epistaxis on 8/12 which lasted >15 mins but was stabilized with cauterization by ENT (pt states she has a h/o epistaxis on warfarin). Had some sporadic epistaxis on 8/14 but  resolved adequately. INR today therapeutic at 2.28. H/H (9.6/28.7) increased from 8/14 (h/o chronic anemia). No s/s of bleeding noted.   VANC + ZOSYN: Pt complaining of pain in LUQ and chills but no fever, some tenderness along course of driveline. Pharmacy consulted to dose empiric tx with vanc + zosyn. Ordered CT of chest, abdomen and pelvis as well as blood and urine cultures. Currently afebrile, WBC up slightly but still wnl (10.9). SCr wnl and stable (CrCl ~52), wt ~71 kg.   Goal of Therapy:  INR 2-3 (closer to 2 per DB - CT showed rectus sheath hematoma) Monitor platelets by anticoagulation protocol: Yes   Plan:  - Warfarin 2.5 mg po x 1  - Monitor INR, CBC, s/s bleeding  - Vanc 750 mg IV q 12 h  - Zosyn 3.375 gm IV q 8 h (4 hr infusion) - Monitor WBC, C&S, clinical status   Margie Billet, PharmD Clinical Pharmacist - Resident Pager: [phone] Pharmacy: [phone] 04/18/2013 2:21 PM

## 2013-04-18 NOTE — Progress Notes (Signed)
1730-pt's family helped pt to chair and walked to bathroom.  Pt encouraged to move about and sit in chair. Brianna Hanson

## 2013-04-18 NOTE — Progress Notes (Signed)
Pt continues to feel bad.  Complained of worsening pain this morning in abdomin (mostly on left upper quadrant). Pt not getting relief from tylenol or ultram.  Called Dr. Gala Romney and made aware of condition. No new labs this morning, STAT orders put in for labs and called IV RN. STAT CT of abdomin and pelvis ordered and CT called and made aware of urgency. Pt resting at this time with husband at bedside. Will continue to monitor. Thomas Hoff

## 2013-04-18 NOTE — Progress Notes (Signed)
  Abdominal CT reviewed. She has a small left rectus sheath hematoma which I suspect is causing her symptoms. No other pathology noted. WBC coming down. Will d/c antibiotics and follow closely. Would run INR as close to 2.0 as possible. Can stop ASA for a few days.   Jame Morrell,MD 3:05 PM

## 2013-04-18 NOTE — Progress Notes (Signed)
Pt and family reminded to keep hat in toilet to record output. Hat placed in toilet. Pt instructed to have visitors and family [place] the bathroom in hall for visitors. Pt stated up around 1700 and voided "small amt" in toilet. Viviana Simpler

## 2013-04-19 ENCOUNTER — Inpatient Hospital Stay (HOSPITAL_COMMUNITY): Payer: Medicare Other

## 2013-04-19 ENCOUNTER — Encounter: Payer: Medicare Other | Admitting: *Deleted

## 2013-04-19 DIAGNOSIS — Z95818 Presence of other cardiac implants and grafts: Secondary | ICD-10-CM

## 2013-04-19 DIAGNOSIS — I5023 Acute on chronic systolic (congestive) heart failure: Secondary | ICD-10-CM

## 2013-04-19 LAB — CBC
HCT: 30.7 % — ABNORMAL LOW (ref 36.0–46.0)
Hemoglobin: 9.9 g/dL — ABNORMAL LOW (ref 12.0–15.0)
MCH: 30.2 pg (ref 26.0–34.0)
MCHC: 32.2 g/dL (ref 30.0–36.0)
MCV: 93.6 fL (ref 78.0–100.0)
Platelets: 406 10*3/uL — ABNORMAL HIGH (ref 150–400)
RBC: 3.28 MIL/uL — ABNORMAL LOW (ref 3.87–5.11)
RDW: 17.7 % — ABNORMAL HIGH (ref 11.5–15.5)
WBC: 11.5 10*3/uL — ABNORMAL HIGH (ref 4.0–10.5)

## 2013-04-19 LAB — AMYLASE: Amylase: 92 U/L (ref 0–105)

## 2013-04-19 LAB — PROTIME-INR
INR: 2.28 — ABNORMAL HIGH (ref 0.00–1.49)
Prothrombin Time: 24.4 seconds — ABNORMAL HIGH (ref 11.6–15.2)

## 2013-04-19 LAB — COMPREHENSIVE METABOLIC PANEL
ALT: 16 U/L (ref 0–35)
AST: 26 U/L (ref 0–37)
Albumin: 2.9 g/dL — ABNORMAL LOW (ref 3.5–5.2)
Alkaline Phosphatase: 89 U/L (ref 39–117)
BUN: 8 mg/dL (ref 6–23)
CO2: 24 mEq/L (ref 19–32)
Calcium: 9.4 mg/dL (ref 8.4–10.5)
Chloride: 99 mEq/L (ref 96–112)
Creatinine, Ser: 0.91 mg/dL (ref 0.50–1.10)
GFR calc Af Amer: 69 mL/min — ABNORMAL LOW (ref >90)
GFR calc non Af Amer: 60 mL/min — ABNORMAL LOW (ref >90)
Glucose, Bld: 88 mg/dL (ref 70–99)
Potassium: 3.9 mEq/L (ref 3.5–5.1)
Sodium: 134 mEq/L — ABNORMAL LOW (ref 135–145)
Total Bilirubin: 0.7 mg/dL (ref 0.3–1.2)
Total Protein: 7.7 g/dL (ref 6.0–8.3)

## 2013-04-19 LAB — LACTATE DEHYDROGENASE: LDH: 424 U/L — ABNORMAL HIGH (ref 94–250)

## 2013-04-19 MED ORDER — WARFARIN SODIUM 2.5 MG PO TABS
2.5000 mg | ORAL_TABLET | Freq: Every day | ORAL | Status: DC
  Administered 2013-04-19 – 2013-04-21 (×3): 2.5 mg via ORAL
  Filled 2013-04-19 (×4): qty 1

## 2013-04-19 NOTE — Progress Notes (Signed)
CARDIAC REHAB PHASE I   PRE:  Rate/Rhythm: 75 SR    BP: sitting 90    SaO2: 95 RA  MODE:  Ambulation: 300 ft   POST:  Rate/Rhythm: 76    BP: sitting 80     SaO2: 89-90 RA  Pt tired but motivated to walk. C/o left sided pain and requested pain meds. Connected herself to batteries but fatigued and I assisted. Steady, no rest needed but tired after walk. SaO2 low. To recliner at pt request, pt connected herself to power module. Sts left side pain is decreasing after receiving pain meds. Comfortable in recliner. BP lower after walk. Will f/u. [id number]  Brianna Hanson [place] CES, ACSM 04/19/2013 3:15 PM

## 2013-04-19 NOTE — Progress Notes (Signed)
Physical Therapy Treatment Patient Details Name: Brianna Hanson MRN: [id number] DOB: 04/23/1935 Today's Date: 04/19/2013 Time: 9562-1308 PT Time Calculation (min): 23 min  PT Assessment / Plan / Recommendation  History of Present Illness 78 year old female with heart failure due severe non-ischemic cardiomyopathy on milrinone and revatio scheduled for Heartmate II LVAD tomorrow for destination therapy   PT Comments   Patient limited by fatigue today required increased VCs. As patient becomes more fatigue, she begins to demonstrate unsafe behaviors with mobility including not locking the rollator, non-compliance with precautions and poor problem solving through conversion of power source. Boyfriend was able to help patient with some cues.  Will continue to work with patient to progress activity as tolerated and reinforce safety with mobility and LVAD management.  Follow Up Recommendations  Home health PT;Supervision/Assistance - 24 hour           Equipment Recommendations  Other (comment) (rollator walker)       Frequency Min 5X/week   Progress towards PT Goals Progress towards PT goals: Progressing toward goals  Plan Current plan remains appropriate    Precautions / Restrictions Precautions Precautions: Sternal;Fall Restrictions Weight Bearing Restrictions: Yes (Sternal Precautions)   Pertinent Vitals/Pain Patient reports 5/10 pain today (abdominal)    Mobility  Bed Mobility Bed Mobility: Not assessed Transfers Transfers: Sit to Stand;Stand to Sit Sit to Stand: 4: Min assist Stand to Sit: 5: Supervision Details for Transfer Assistance: VCs for precautions, as patient becomes more fatigue, patient demonstrates unsafe behaviors with mobility including not locking the rollator, non-compliance with precautions and poor problem solving through conversion Ambulation/Gait Ambulation/Gait Assistance: 5: Supervision Ambulation Distance (Feet): 350 Feet Assistive device: Other  (Comment) (rollator) Gait Pattern: Step-through pattern Gait velocity: decreased      PT Goals (current goals can now be found in the care plan section) Acute Rehab PT Goals Patient Stated Goal: to go home PT Goal Formulation: With patient Time For Goal Achievement: 04/22/13 Potential to Achieve Goals: Good  Visit Information  Last PT Received On: 04/19/13 Assistance Needed: +1 History of Present Illness: 78 year old female with heart failure due severe non-ischemic cardiomyopathy on milrinone and revatio scheduled for Heartmate II LVAD tomorrow for destination therapy    Subjective Data  Subjective: I am not doing well today, i'm so tired Patient Stated Goal: to go home   Cognition  Cognition Arousal/Alertness: Awake/alert Behavior During Therapy: WFL for tasks assessed/performed Overall Cognitive Status: Within Functional Limits for tasks assessed    Balance   Improved stability  End of Session PT - End of Session Equipment Utilized During Treatment: Other (comment) (LVAD equipment) Activity Tolerance: Patient limited by fatigue Patient left: in chair;with call bell/phone within reach;with family/visitor present Nurse Communication: Mobility status   GP     Fabio Asa 04/19/2013, 9:48 AM Charlotte Crumb, PT DPT  [phone]

## 2013-04-19 NOTE — Progress Notes (Signed)
Patient ID: Brianna Hanson, female   DOB: [birth year], 78 y.o.   MRN: [id number]  HeartMate 2 Rounding Note  Subjective:    Feels better. Still has some left flank pain but better than yesterday. Had BM last night. No nausea. No dyspnea.   LVAD INTERROGATION:  HeartMate II LVAD:  Flow 4.4 liters/min, speed 9000, power 5, PI 6.8.    Objective:    Vital Signs:   Temp:  [98.1 F (36.7 C)-98.6 F (37 C)] 98.1 F (36.7 C) (08/18 4098) Pulse Rate:  [65] 65 (08/17 1816) SpO2:  [96 %-98 %] 98 % (08/18 1191) Weight:  [69.4 kg (153 lb)] 69.4 kg (153 lb) (08/18 0500) Last BM Date: 04/15/13 Mean arterial Pressure 90  Intake/Output:   Intake/Output Summary (Last 24 hours) at 04/19/13 0835 Last data filed at 04/19/13 0653  Gross per 24 hour  Intake    770 ml  Output    175 ml  Net    595 ml     Physical Exam: General:  Well appearing. No resp difficulty HEENT: normal Neck: supple. No JVP . Carotids 2+ bilat; no bruits.  Cor:  LVAD hum present. Lungs: clear Chest: mild tenderness along left lateral lower chest to left upper quadrant. Abdomen: soft, nontender, nondistended. No hepatosplenomegaly. No bruits or masses. Good bowel sounds. Driveline: C/D/I; securement device intact and driveline incorporated Extremities: no cyanosis, clubbing, rash, edema Neuro: alert & orientedx3, cranial nerves grossly intact. moves all 4 extremities w/o difficulty. Affect pleasant  Telemetry: sinus rhythm  Labs: Basic Metabolic Panel:  Recent Labs Lab 04/15/13 0500 04/16/13 0500 04/17/13 0500 04/18/13 1040 04/19/13 0502  NA 135 136 137 134* 134*  K 4.3 3.7 3.8 4.0 3.9  CL 99 100 100 100 99  CO2 28 26 26 26 24   GLUCOSE 78 89 89 95 88  BUN 8 7 7 8 8   CREATININE 0.75 0.86 0.80 0.79 0.91  CALCIUM 8.6 9.0 8.9 9.1 9.4    Liver Function Tests:  Recent Labs Lab 04/15/13 0500 04/19/13 0502  AST 29 26  ALT 17 16  ALKPHOS 90 89  BILITOT 0.6 0.7  PROT 6.4 7.7  ALBUMIN 2.5* 2.9*     Recent Labs Lab 04/19/13 0502  AMYLASE 92   No results found for this basename: AMMONIA,  in the last 168 hours  CBC:  Recent Labs Lab 04/14/13 0545 04/15/13 0500 04/17/13 0500 04/18/13 1040 04/19/13 0502  WBC 10.5 8.6 11.0* 10.9* 11.5*  HGB 9.1* 8.7* 9.9* 9.6* 9.9*  HCT 27.2* 26.7* 29.3* 28.7* 30.7*  MCV 92.8 93.4 92.4 92.9 93.6  PLT 236 262 361 375 406*    INR:  Recent Labs Lab 04/15/13 0500 04/16/13 0500 04/17/13 0500 04/18/13 1040 04/19/13 0502  INR 2.53* 2.32* 2.44* 2.28* 2.28*   LDH 392 <547 <550< 423<424  Other results:  EKG:   Imaging: Ct Abdomen Pelvis W Contrast  04/18/2013   CLINICAL DATA:  Epigastric and abdominal pain, most severe in left upper quadrant. Postop from left ventricular assist device placement.  EXAM: CT ABDOMEN AND PELVIS WITH CONTRAST  TECHNIQUE: Multidetector CT imaging of the abdomen and pelvis was performed using the standard protocol following bolus administration of intravenous contrast.  CONTRAST:  OMNIPAQUE IOHEXOL 300 MG/ML  SOLN  COMPARISON:  01/28/2012  FINDINGS: Left ventricular assist device is seen in expected position, which causes considerable beam hardening artifact. A small amount of pericardial fluid is noted in the anterior pericardial recess. Small left pleural effusion  is seen with left lower lobe atelectasis. No evidence of abdominal or subdiaphragmatic fluid collections. A small hematoma is seen in the left lateral rectus abdominis muscles at the level of the anterior superior iliac spine which measures approximately 2.7 x 4.8 cm.  Tiny central hepatic cyst is seen but no liver masses are identified. Tiny calcified gallstones noted, however there is no evidence of acute cholecystitis or biliary dilatation. The visualized portion of pancreas, spleen, and adrenal glands are normal in appearance. Tiny renal cysts are noted are there is no evidence of renal mass or hydronephrosis. No soft tissue mass or  lymphadenopathy identified within the abdomen or pelvis.  Tiny amount of gas is seen urinary bladder, likely from recent bladder catheterization. No intra-abdominal inflammatory process identified. No evidence of bowel wall thickening, dilatation, or hernia.  IMPRESSION: Left ventricular assist device, with small amount of pericardial fluid noted in the anterior pericardial recess.  Small left pleural effusion and left lower lobe atelectasis.  Small left rectus sheath hematoma.  No evidence of intra-abdominal inflammatory process or abscess.  Cholelithiasis. No radiographic evidence of cholecystitis.   Electronically Signed   By: Myles Rosenthal   On: 04/18/2013 14:59   Dg Chest Port 1 View  04/19/2013   *RADIOLOGY REPORT*  Clinical Data: Ventricular fibrillation  PORTABLE CHEST - 1 VIEW  Comparison: April 15, 2013.  Findings: Sternotomy wires are noted.  Stable mild cardiomegaly. Right-sided PICC line and left-sided pacemaker unchanged in position.  Left ventricular assistance device remains in place and is unchanged. Right lung is clear.  Left basilar opacity appears to be improved, although residual density and minimal effusion remains.  IMPRESSION: Improved left basilar opacity compared to prior exam.   Original Report Authenticated By: Lupita Raider.,  M.D.   Dg Abd Portable 1v  04/19/2013   *RADIOLOGY REPORT*  Clinical Data: Abdominal pain yesterday.  PORTABLE ABDOMEN - 1 VIEW  Comparison: CT abdomen and pelvis 04/18/2013  Findings: Residual contrast material in the colon.  No small or large bowel distension.  No radiopaque stones.  Calcified phleboliths in the pelvis.  Degenerative changes in the spine and hips.  Calcifications projected over the right lateral pelvic soft tissues likely represent injection granulomas.  IMPRESSION: Nonobstructive bowel gas pattern.  Residual contrast material in the colon.   Original Report Authenticated By: Burman Nieves, M.D.      Medications:     Scheduled  Medications: . amiodarone  200 mg Oral BID  . amLODipine  10 mg Oral Daily  . bisacodyl  10 mg Oral Daily   Or  . bisacodyl  10 mg Rectal Daily  . carvedilol  6.25 mg Oral BID WC  . cholecalciferol  1,000 Units Oral Daily  . digoxin  0.125 mg Oral Daily  . docusate sodium  200 mg Oral Daily  . feeding supplement  237 mL Oral BID BM  . ferrous fumarate-b12-vitamic C-folic acid  1 capsule Oral TID PC  . hydrALAZINE  25 mg Oral Q8H  . losartan  100 mg Oral Daily  . oxymetazoline  1 spray Each Nare BID  . pantoprazole  40 mg Oral Daily  . potassium chloride  20 mEq Oral Daily  . sildenafil  20 mg Oral TID  . sodium chloride  3 mL Intravenous Q12H  . spironolactone  25 mg Oral Daily  . torsemide  20 mg Oral QODAY  . warfarin  2.5 mg Oral q1800  . Warfarin - Pharmacist Dosing Inpatient   Does  not apply q1800     Infusions: . sodium chloride 500 mL (04/19/13 0307)     PRN Medications:  sodium chloride, acetaminophen, ALPRAZolam, bisacodyl, bisacodyl, fentaNYL, guaiFENesin, hydrALAZINE, magnesium hydroxide, ondansetron (ZOFRAN) IV, sodium chloride, traMADol, white petrolatum   Assessment:   She is doing well overall. The left chest wall to upper quadrant pain is improved. This is likely related to the pump and she does have a small rectus hematoma that may be contributing. There is no sign of infection and no sign of abdominal problem.   Plan/Discussion:    Continue observation. She is about ready to go home if this continues to improve.    I reviewed the LVAD parameters from today, and compared the results to the patient's prior recorded data.  No programming changes were made.  The LVAD is functioning within specified parameters.  The patient performs LVAD self-test daily.  LVAD interrogation was negative for any significant power changes, alarms or PI events/speed drops.  LVAD equipment check completed and is in good working order.  Back-up equipment present.   LVAD education  done on emergency procedures and precautions and reviewed exit site care.  Length of Stay: 14  BARTLE,BRYAN K 04/19/2013, 8:35 AM  VAD Team Pager [phone] (7am - 7am)

## 2013-04-19 NOTE — Progress Notes (Signed)
Acknowledge order to pull EPW's, however Pt found to have none.  Will con't plan of care.

## 2013-04-19 NOTE — Progress Notes (Signed)
Occupational Therapy Treatment Patient Details Name: MARION SEESE MRN: [id number] DOB: Jul 19, 1935 Today's Date: 04/19/2013 Time: 4098-1191 OT Time Calculation (min): 33 min  OT Assessment / Plan / Recommendation  History of present illness 78 year old female with heart failure due severe non-ischemic cardiomyopathy on milrinone and revatio scheduled for Heartmate II LVAD tomorrow for destination therapy   OT comments  Pt limited by fatigue today. SOB with minimal activity. C/o abdominal pain. States she has "no energy". Max effort to complete simple ADL task. Required mi vc to correctly position LVAD equipment into vest and not twist wires due to fatigue. Boyfriend participated in session - educated on management of equipment during ADL, use of AE and energy conservation. Will continue to follow.  Follow Up Recommendations  Home health OT    Barriers to Discharge       Equipment Recommendations  Other (comment);3 in 1 bedside comode    Recommendations for Other Services    Frequency Min 3X/week   Progress towards OT Goals Progress towards OT goals: Progressing toward goals  Plan Discharge plan remains appropriate    Precautions / Restrictions Precautions Precautions: Sternal;Fall   Pertinent Vitals/Pain 5/10 abdomen. nsg aware    ADL  Lower Body Dressing: Minimal assistance Where Assessed - Lower Body Dressing: Unsupported sit to stand ADL Comments: focus of session on ADL retraining with use of AE with pt and [name]. Pt required min vc for correct useof AE. [name] verbalized understanding. Pt switched from power to battery with min vc for correct positioning  of eqipment into vest. discussed vest options.    OT Diagnosis:    OT Problem List:   OT Treatment Interventions:     OT Goals(current goals can now be found in the care plan section) Acute Rehab OT Goals Patient Stated Goal: to go home OT Goal Formulation: With patient Time For Goal Achievement: 04/22/13 Potential  to Achieve Goals: Good ADL Goals Pt Will Perform Grooming: with modified independence;standing Pt Will Perform Upper Body Bathing: with supervision;with set-up;with caregiver independent in assisting;sitting Pt Will Perform Lower Body Bathing: with set-up;with supervision;with caregiver independent in assisting;with adaptive equipment;sit to/from stand Pt Will Perform Upper Body Dressing: with supervision;with set-up;with caregiver independent in assisting;sitting Pt Will Transfer to Toilet: with supervision;ambulating;bedside commode Pt Will Perform Toileting - Clothing Manipulation and hygiene: with modified independence;sitting/lateral leans;sit to/from stand Additional ADL Goal #1: independent withsternal precautions during ADL and mobility for ADL  Visit Information  Last OT Received On: 04/19/13 Assistance Needed: +1 History of Present Illness: 78 year old female with heart failure due severe non-ischemic cardiomyopathy on milrinone and revatio scheduled for Heartmate II LVAD tomorrow for destination therapy    Subjective Data      Prior Functioning       Cognition  Cognition Arousal/Alertness: Awake/alert Behavior During Therapy: WFL for tasks assessed/performed Overall Cognitive Status: Within Functional Limits for tasks assessed    Mobility    More effort required today due to fatigue   Exercises   encourgged incentive spirometer, squeeze ball and BUE AROM within tolerance   Balance     End of Session OT - End of Session Equipment Utilized During Treatment:  (LVAD equipment) Activity Tolerance: Patient limited by fatigue Patient left: in chair;with call bell/phone within reach;with family/visitor present Nurse Communication: Mobility status  GO     Karista Aispuro,HILLARY 04/19/2013, 9:18 AM [facility], OTR/L  [phone] 04/19/2013

## 2013-04-19 NOTE — Progress Notes (Addendum)
ANTICOAGULATION - Follow Up Consult  Pharmacy Consult for Warfarin  Indication: atrial fibrillation   No Known Allergies  Patient Measurements: Height: 4' 11.84" (152 cm) Weight: 153 lb (69.4 kg) IBW/kg (Calculated) : 45.14  Vital Signs: Temp: 98.1 F (36.7 C) (08/18 0981)  Labs:  Recent Labs  04/17/13 0500 04/18/13 1040 04/19/13 0502  HGB 9.9* 9.6* 9.9*  HCT 29.3* 28.7* 30.7*  PLT 361 375 406*  LABPROT 25.7* 24.4* 24.4*  INR 2.44* 2.28* 2.28*  CREATININE 0.80 0.79 0.91   Estimated Creatinine Clearance: 45.5 ml/min (by C-G formula based on Cr of 0.91).  Medications:  Prescriptions prior to admission  Medication Sig Dispense Refill  . amiodarone (PACERONE) 200 MG tablet Take 200 mg by mouth daily.      . Cholecalciferol (VITAMIN D3) 1000 UNITS CAPS Take 1 tablet by mouth daily.        . furosemide (LASIX) 80 MG tablet Take 80 mg by mouth 2 (two) times daily.      [name] losartan (COZAAR) 50 MG tablet Take 50 mg by mouth daily.      . milrinone (PRIMACOR) 20 MG/100ML SOLN infusion Inject 28.7625 mcg/min into the vein continuous.  100 mL  6  . potassium chloride SA (K-DUR,KLOR-CON) 20 MEQ tablet Take 20 mEq by mouth 2 (two) times daily.      . sildenafil (REVATIO) 20 MG tablet Take 60 mg by mouth 3 (three) times daily.      [name] warfarin (COUMADIN) 2.5 MG tablet Take 2.5 mg by mouth daily.       Assessment: 78 yo F with PMH of Afib and HF d/t severe NICM who presented for LVAD placement on 04/06/13.   WARFARIN: Warfarin was restarted on 8/6 (MD dosing) with BL INR of 1.44. Pharmacy consulted for follow up dosing.  Patient had an episode of epistaxis on 8/12 which lasted >15 mins but was stabilized with cauterization by ENT (pt states she has a h/o epistaxis on warfarin). Had some sporadic epistaxis on 8/14 but resolved adequately.  Small rectus sheath hematoma noted.   INR has leveled out, today again 2.28.  H/H remains stable although chronic anemia is noted.  HOME Dose:  On  warfarin at 2.5 mg po daily.   DRUG/DRUG Interactions: Amiodarone may inhibit hepatic metabolism and increase the anticoagulant effect of warfarin. Bleeding may occur.   Goal of Therapy:  INR 2-3 (~ 2.0 per MD due to small rectus sheath hematoma) Monitor platelets by anticoagulation protocol: Yes   Plan:  - Warfarin 2.5 mg po daily - Monitor INR, CBC, s/s bleeding  Nadara Mustard, PharmD., MS Clinical Pharmacist Pager:  [phone] Thank you for allowing pharmacy to be part of this patients care team. 04/19/2013 8:29 AM

## 2013-04-19 NOTE — Progress Notes (Signed)
Husband observed performing sterile driveline dressing change.  Proper technique with minimal verbal cues needed.  Will con't plan of care.

## 2013-04-19 NOTE — Progress Notes (Signed)
RN noted red blood in toilet with BM, Pt states she does not have hemorrhoids.  VAD coordinator [facility] notified, await call from NP Amy.  Pt without new complaints, left abd pain unchanged, responsive to PRN meds as ordered.  Will con't plan of care.

## 2013-04-19 NOTE — Progress Notes (Signed)
Patient ID: Brianna Hanson, female   DOB: 11-27-1934, 78 y.o.   MRN: [id number] HeartMate 2 Rounding Note  Subjective:    78 y/o female with severe CHF due to NICM, PAH, chronic AF underwent HM II VAD implant 04/06/13  POD #13. Yesterday CT scan for LUQ pain showed a small left rectus sheath hematoma. ASA stopped. Feels better. MAPs improving. 85-90  LVAD INTERROGATION:  HeartMate II LVAD:  Flow 4.5 liters/min, speed 8980, power 5.0, PI 6.0  No PI events in last 24 hours   Objective:    Vital Signs:   Temp:  [98.1 F (36.7 C)-98.6 F (37 C)] 98.1 F (36.7 C) (08/18 4098) Pulse Rate:  [65] 65 (08/17 1816) SpO2:  [96 %-98 %] 98 % (08/18 0638) Weight:  [69.4 kg (153 lb)] 69.4 kg (153 lb) (08/18 0500) Last BM Date: 04/15/13 Mean arterial Pressure 90-100s  Intake/Output:   Intake/Output Summary (Last 24 hours) at 04/19/13 1736 Last data filed at 04/19/13 1500  Gross per 24 hour  Intake    480 ml  Output      1 ml  Net    479 ml     Physical Exam: General:  sitting in chair HEENT: Normal Neck: supple. JVP 5 . Carotids 2+ bilat; no bruits. No lymphadenopathy or thryomegaly appreciated. Cor: Mechanical heart sounds with LVAD hum present. Dressing c/d/i Lungs: clear Abdomen: soft, nondistended. + BS. Mildly tender in LUQ Driveline: C/D/I; securement device intact;  Extremities: no cyanosis, clubbing, rash, no edema  Neuro: awake alert. Pleasant. nonfocal  Telemetry: sinus 70-80s  Labs: Basic Metabolic Panel:  Recent Labs Lab 04/15/13 0500 04/16/13 0500 04/17/13 0500 04/18/13 1040 04/19/13 0502  NA 135 136 137 134* 134*  K 4.3 3.7 3.8 4.0 3.9  CL 99 100 100 100 99  CO2 28 26 26 26 24   GLUCOSE 78 89 89 95 88  BUN 8 7 7 8 8   CREATININE 0.75 0.86 0.80 0.79 0.91  CALCIUM 8.6 9.0 8.9 9.1 9.4    Liver Function Tests:  Recent Labs Lab 04/15/13 0500 04/19/13 0502  AST 29 26  ALT 17 16  ALKPHOS 90 89  BILITOT 0.6 0.7  PROT 6.4 7.7  ALBUMIN 2.5* 2.9*    Recent  Labs Lab 04/19/13 0502  AMYLASE 92   No results found for this basename: AMMONIA,  in the last 168 hours  CBC:  Recent Labs Lab 04/14/13 0545 04/15/13 0500 04/17/13 0500 04/18/13 1040 04/19/13 0502  WBC 10.5 8.6 11.0* 10.9* 11.5*  HGB 9.1* 8.7* 9.9* 9.6* 9.9*  HCT 27.2* 26.7* 29.3* 28.7* 30.7*  MCV 92.8 93.4 92.4 92.9 93.6  PLT 236 262 361 375 406*    INR: 1.39  Recent Labs Lab 04/15/13 0500 04/16/13 0500 04/17/13 0500 04/18/13 1040 04/19/13 0502  INR 2.53* 2.32* 2.44* 2.28* 2.28*   Imaging: Ct Abdomen Pelvis W Contrast  04/18/2013   CLINICAL DATA:  Epigastric and abdominal pain, most severe in left upper quadrant. Postop from left ventricular assist device placement.  EXAM: CT ABDOMEN AND PELVIS WITH CONTRAST  TECHNIQUE: Multidetector CT imaging of the abdomen and pelvis was performed using the standard protocol following bolus administration of intravenous contrast.  CONTRAST:  OMNIPAQUE IOHEXOL 300 MG/ML  SOLN  COMPARISON:  01/28/2012  FINDINGS: Left ventricular assist device is seen in expected position, which causes considerable beam hardening artifact. A small amount of pericardial fluid is noted in the anterior pericardial recess. Small left pleural effusion is seen with  left lower lobe atelectasis. No evidence of abdominal or subdiaphragmatic fluid collections. A small hematoma is seen in the left lateral rectus abdominis muscles at the level of the anterior superior iliac spine which measures approximately 2.7 x 4.8 cm.  Tiny central hepatic cyst is seen but no liver masses are identified. Tiny calcified gallstones noted, however there is no evidence of acute cholecystitis or biliary dilatation. The visualized portion of pancreas, spleen, and adrenal glands are normal in appearance. Tiny renal cysts are noted are there is no evidence of renal mass or hydronephrosis. No soft tissue mass or lymphadenopathy identified within the abdomen or pelvis.  Tiny amount of gas  is seen urinary bladder, likely from recent bladder catheterization. No intra-abdominal inflammatory process identified. No evidence of bowel wall thickening, dilatation, or hernia.  IMPRESSION: Left ventricular assist device, with small amount of pericardial fluid noted in the anterior pericardial recess.  Small left pleural effusion and left lower lobe atelectasis.  Small left rectus sheath hematoma.  No evidence of intra-abdominal inflammatory process or abscess.  Cholelithiasis. No radiographic evidence of cholecystitis.   Electronically Signed   By: Myles Rosenthal   On: 04/18/2013 14:59   Dg Chest Port 1 View  04/19/2013   *RADIOLOGY REPORT*  Clinical Data: Ventricular fibrillation  PORTABLE CHEST - 1 VIEW  Comparison: April 15, 2013.  Findings: Sternotomy wires are noted.  Stable mild cardiomegaly. Right-sided PICC line and left-sided pacemaker unchanged in position.  Left ventricular assistance device remains in place and is unchanged. Right lung is clear.  Left basilar opacity appears to be improved, although residual density and minimal effusion remains.  IMPRESSION: Improved left basilar opacity compared to prior exam.   Original Report Authenticated By: Lupita Raider.,  M.D.   Dg Abd Portable 1v  04/19/2013   *RADIOLOGY REPORT*  Clinical Data: Abdominal pain yesterday.  PORTABLE ABDOMEN - 1 VIEW  Comparison: CT abdomen and pelvis 04/18/2013  Findings: Residual contrast material in the colon.  No small or large bowel distension.  No radiopaque stones.  Calcified phleboliths in the pelvis.  Degenerative changes in the spine and hips.  Calcifications projected over the right lateral pelvic soft tissues likely represent injection granulomas.  IMPRESSION: Nonobstructive bowel gas pattern.  Residual contrast material in the colon.   Original Report Authenticated By: Burman Nieves, M.D.    Medications:     Scheduled Medications: . amiodarone  200 mg Oral BID  . amLODipine  10 mg Oral Daily  .  bisacodyl  10 mg Oral Daily   Or  . bisacodyl  10 mg Rectal Daily  . carvedilol  6.25 mg Oral BID WC  . cholecalciferol  1,000 Units Oral Daily  . digoxin  0.125 mg Oral Daily  . docusate sodium  200 mg Oral Daily  . feeding supplement  237 mL Oral BID BM  . ferrous fumarate-b12-vitamic C-folic acid  1 capsule Oral TID PC  . hydrALAZINE  25 mg Oral Q8H  . losartan  100 mg Oral Daily  . oxymetazoline  1 spray Each Nare BID  . pantoprazole  40 mg Oral Daily  . potassium chloride  20 mEq Oral Daily  . sildenafil  20 mg Oral TID  . sodium chloride  3 mL Intravenous Q12H  . spironolactone  25 mg Oral Daily  . torsemide  20 mg Oral QODAY  . warfarin  2.5 mg Oral q1800  . Warfarin - Pharmacist Dosing Inpatient   Does not apply [phone]  Infusions: . sodium chloride 500 mL (04/19/13 0307)    PRN Medications: sodium chloride, acetaminophen, ALPRAZolam, bisacodyl, bisacodyl, fentaNYL, guaiFENesin, hydrALAZINE, magnesium hydroxide, ondansetron (ZOFRAN) IV, sodium chloride, traMADol, white petrolatum   Assessment:   1. Acute on chronic systolic HF due to NICM     --s/p HM II VAD on 04/06/13 2. PAH 3. Acute and chronic AF  - now in NSR 4. VT 5. Hypokalemia/hypomagnesemia 6. Epistaxis - resolved 7. Ab pain d/t rectus sheath hematoma  Plan/Discussion:    POD # 13. Doing well. Discussed rectus sheath hematoma. Will hold ASA. Run INR 2.0-2.3. Continue to focus on ambulation and education. Maintaining SR on amio.    I reviewed the LVAD parameters from today, and compared the results to the patient's prior recorded data.  No programming changes were made.  The LVAD is functioning within specified parameters.  The patient performs LVAD self-test daily.  LVAD interrogation was negative for any significant power changes, alarms or PI events/speed drops.  LVAD equipment check completed and is in good working order.  Back-up equipment present.   LVAD education done on emergency procedures and  precautions and reviewed exit site care.  Length of Stay: 59  Ronnae Kaser BensimhonMD 04/19/2013, 5:36 PM  Advanced Heart Failure Team Pager [phone] (M-F; 7a - 4p)  Please contact [facility] for night-coverage after hours (4p -7a ) and weekends on [email or web address]  VAD Team Pager [phone] (7am - 7am)  For VAD issues only

## 2013-04-20 ENCOUNTER — Ambulatory Visit: Payer: Medicare Other | Admitting: Oncology

## 2013-04-20 ENCOUNTER — Other Ambulatory Visit: Payer: Medicare Other | Admitting: Lab

## 2013-04-20 DIAGNOSIS — Z95818 Presence of other cardiac implants and grafts: Secondary | ICD-10-CM

## 2013-04-20 DIAGNOSIS — I5023 Acute on chronic systolic (congestive) heart failure: Secondary | ICD-10-CM

## 2013-04-20 DIAGNOSIS — I428 Other cardiomyopathies: Secondary | ICD-10-CM

## 2013-04-20 LAB — CBC
HCT: 29.2 % — ABNORMAL LOW (ref 36.0–46.0)
Hemoglobin: 9.5 g/dL — ABNORMAL LOW (ref 12.0–15.0)
MCH: 30.5 pg (ref 26.0–34.0)
MCHC: 32.5 g/dL (ref 30.0–36.0)
MCV: 93.9 fL (ref 78.0–100.0)
Platelets: 400 10*3/uL (ref 150–400)
RBC: 3.11 MIL/uL — ABNORMAL LOW (ref 3.87–5.11)
RDW: 18.1 % — ABNORMAL HIGH (ref 11.5–15.5)
WBC: 9.5 10*3/uL (ref 4.0–10.5)

## 2013-04-20 LAB — BASIC METABOLIC PANEL
BUN: 8 mg/dL (ref 6–23)
CO2: 23 mEq/L (ref 19–32)
Calcium: 9 mg/dL (ref 8.4–10.5)
Chloride: 104 mEq/L (ref 96–112)
Creatinine, Ser: 0.88 mg/dL (ref 0.50–1.10)
GFR calc Af Amer: 72 mL/min — ABNORMAL LOW (ref >90)
GFR calc non Af Amer: 62 mL/min — ABNORMAL LOW (ref >90)
Glucose, Bld: 77 mg/dL (ref 70–99)
Potassium: 3.9 mEq/L (ref 3.5–5.1)
Sodium: 136 mEq/L (ref 135–145)

## 2013-04-20 LAB — URINE CULTURE: Colony Count: >=100000

## 2013-04-20 LAB — PROTIME-INR
INR: 2.23 — ABNORMAL HIGH (ref 0.00–1.49)
Prothrombin Time: 24 seconds — ABNORMAL HIGH (ref 11.6–15.2)

## 2013-04-20 LAB — LACTATE DEHYDROGENASE: LDH: 404 U/L — ABNORMAL HIGH (ref 94–250)

## 2013-04-20 MED ORDER — CARVEDILOL 6.25 MG PO TABS
9.3750 mg | ORAL_TABLET | Freq: Two times a day (BID) | ORAL | Status: DC
  Administered 2013-04-20 – 2013-04-22 (×4): 9.375 mg via ORAL
  Filled 2013-04-20 (×6): qty 1

## 2013-04-20 MED ORDER — CEPHALEXIN 500 MG PO CAPS
500.0000 mg | ORAL_CAPSULE | Freq: Three times a day (TID) | ORAL | Status: DC
  Administered 2013-04-20 – 2013-04-22 (×7): 500 mg via ORAL
  Filled 2013-04-20 (×9): qty 1

## 2013-04-20 NOTE — Progress Notes (Signed)
Sister performed VAD dressing change under VAD coordinator supervision using daily dressing kit with silver strip around drive line. Sister performed sterile technique without difficulty.  Exit site with small amount dark bloody drainage; no redness, tenderness, or foul odor noted; outer suture removed; one suture remains.

## 2013-04-20 NOTE — Progress Notes (Signed)
Physical Therapy Treatment Patient Details Name: DAZIYAH COGAN MRN: [id number] DOB: [birth year] Today's Date: 04/20/2013 Time: 1012-1036 PT Time Calculation (min): 24 min  PT Assessment / Plan / Recommendation  History of Present Illness 78 year old female with heart failure due severe non-ischemic cardiomyopathy on milrinone and revatio scheduled for Heartmate II LVAD tomorrow for destination therapy   PT Comments   Patient was able to perform stair negotiation today. Patients boyfriend was able to assist, patient demonstrating steady progress with activity tolerance but continues to require increased cues for equipment management upon fatigue. Boyfriend has been able to educate and assist with cues as needed. Feel patient is doing well and is progressing towards discharge soon.   Follow Up Recommendations  Home health PT;Supervision/Assistance - 24 hour           Equipment Recommendations  Other (comment) (rollator walker)       Frequency Min 5X/week   Progress towards PT Goals Progress towards PT goals: Progressing toward goals  Plan Current plan remains appropriate    Precautions / Restrictions Precautions Precautions: Sternal;Fall Restrictions Weight Bearing Restrictions: Yes (Sternal Precautions)   Pertinent Vitals/Pain No pain reported at this time    Mobility  Bed Mobility Bed Mobility: Not assessed Transfers Transfers: Sit to Stand;Stand to Sit Sit to Stand: 5: Supervision Stand to Sit: 5: Supervision Details for Transfer Assistance: VCs for precautions, as patient becomes more fatigue, patient demonstrates unsafe behaviors with mobility including not locking the rollator, non-compliance with precautions and poor problem solving through conversion Ambulation/Gait Ambulation/Gait Assistance: 5: Supervision Ambulation Distance (Feet): 310 Feet Assistive device: Other (Comment) (rollator) Gait Pattern: Step-through pattern Gait velocity: decreased Stairs:  Yes Stairs Assistance: 4: Min guard Stair Management Technique: One rail Right;Step to pattern;Forwards Number of Stairs: 4 (performed x 2 with [name] (boyfriend) assisting)      PT Goals (current goals can now be found in the care plan section) Acute Rehab PT Goals Patient Stated Goal: to go home PT Goal Formulation: With patient Time For Goal Achievement: 04/22/13 Potential to Achieve Goals: Good  Visit Information  Last PT Received On: 04/20/13 Assistance Needed: +1 History of Present Illness: 78 year old female with heart failure due severe non-ischemic cardiomyopathy on milrinone and revatio scheduled for Heartmate II LVAD tomorrow for destination therapy    Subjective Data  Subjective: I am not doing well today, i'm so tired Patient Stated Goal: to go home   Cognition  Cognition Arousal/Alertness: Awake/alert Behavior During Therapy: WFL for tasks assessed/performed Overall Cognitive Status: Within Functional Limits for tasks assessed       End of Session PT - End of Session Equipment Utilized During Treatment: Other (comment) (LVAD equipment) Activity Tolerance: Patient limited by fatigue Patient left: in chair;with call bell/phone within reach;with family/visitor present Nurse Communication: Mobility status   GP     Fabio Asa 04/20/2013, 11:58 AM Charlotte Crumb, PT DPT  [phone]

## 2013-04-20 NOTE — Progress Notes (Signed)
Patient ID: Brianna Hanson, female   DOB: Apr 16, 1935, 78 y.o.   MRN: [id number]  HeartMate 2 Rounding Note  Subjective:    POD #14. CT scan (8/17) for LUQ pain showed a small left rectus sheath hematoma. ASA stopped. Feeling much better and pain improving. Small amount frank blood with stool yesterday. None today. She reports a history of bleeding hemorrhoids 2 years ago that was self-limiting. She was seen by Dr. Elnoria Howard.    LVAD INTERROGATION:  HeartMate II LVAD:  Flow 4.7 liters/min, speed 9000, power 5.4, PI 6.6.  No PI Events  Objective:    Vital Signs:   Temp:  [97.8 F (36.6 C)-98.8 F (37.1 C)] 98 F (36.7 C) (08/19 1325) Weight:  [70.489 kg (155 lb 6.4 oz)] 70.489 kg (155 lb 6.4 oz) (08/19 0500) Last BM Date: 04/15/13 Mean arterial Pressure 88-96  Intake/Output:   Intake/Output Summary (Last 24 hours) at 04/20/13 1332 Last data filed at 04/20/13 1302  Gross per 24 hour  Intake 1644.67 ml  Output   1301 ml  Net 343.67 ml     Physical Exam: General:  Well appearing. No resp difficulty HEENT: normal Neck: supple. JVP . Carotids 2+ bilat; no bruits. No lymphadenopathy or thryomegaly appreciated. Cor:  LVAD hum present. Lungs: clear Abdomen: soft,minimal left upper quadrant tenderness, nondistended. No hepatosplenomegaly. No bruits or masses. Good bowel sounds. Driveline: C/D/I; securement device intact and driveline incorporated Extremities: no cyanosis, clubbing, rash, edema Neuro: alert & orientedx3,  moves all 4 extremities w/o difficulty. Affect pleasant  Telemetry: sinus  Labs: Basic Metabolic Panel:  Recent Labs Lab 04/16/13 0500 04/17/13 0500 04/18/13 1040 04/19/13 0502 04/20/13 0550  NA 136 137 134* 134* 136  K 3.7 3.8 4.0 3.9 3.9  CL 100 100 100 99 104  CO2 26 26 26 24 23   GLUCOSE 89 89 95 88 77  BUN 7 7 8 8 8   CREATININE 0.86 0.80 0.79 0.91 0.88  CALCIUM 9.0 8.9 9.1 9.4 9.0    Liver Function Tests:  Recent Labs Lab 04/15/13 0500  04/19/13 0502  AST 29 26  ALT 17 16  ALKPHOS 90 89  BILITOT 0.6 0.7  PROT 6.4 7.7  ALBUMIN 2.5* 2.9*    Recent Labs Lab 04/19/13 0502  AMYLASE 92   No results found for this basename: AMMONIA,  in the last 168 hours  CBC:  Recent Labs Lab 04/15/13 0500 04/17/13 0500 04/18/13 1040 04/19/13 0502 04/20/13 0550  WBC 8.6 11.0* 10.9* 11.5* 9.5  HGB 8.7* 9.9* 9.6* 9.9* 9.5*  HCT 26.7* 29.3* 28.7* 30.7* 29.2*  MCV 93.4 92.4 92.9 93.6 93.9  PLT 262 361 375 406* 400    INR:  Recent Labs Lab 04/16/13 0500 04/17/13 0500 04/18/13 1040 04/19/13 0502 04/20/13 0550  INR 2.32* 2.44* 2.28* 2.28* 2.23*    LDH 392 <547 <550< 423<424<404  Other results:  EKG:   Imaging: Ct Abdomen Pelvis W Contrast  04/18/2013   CLINICAL DATA:  Epigastric and abdominal pain, most severe in left upper quadrant. Postop from left ventricular assist device placement.  EXAM: CT ABDOMEN AND PELVIS WITH CONTRAST  TECHNIQUE: Multidetector CT imaging of the abdomen and pelvis was performed using the standard protocol following bolus administration of intravenous contrast.  CONTRAST:  OMNIPAQUE IOHEXOL 300 MG/ML  SOLN  COMPARISON:  01/28/2012  FINDINGS: Left ventricular assist device is seen in expected position, which causes considerable beam hardening artifact. A small amount of pericardial fluid is noted in the anterior  pericardial recess. Small left pleural effusion is seen with left lower lobe atelectasis. No evidence of abdominal or subdiaphragmatic fluid collections. A small hematoma is seen in the left lateral rectus abdominis muscles at the level of the anterior superior iliac spine which measures approximately 2.7 x 4.8 cm.  Tiny central hepatic cyst is seen but no liver masses are identified. Tiny calcified gallstones noted, however there is no evidence of acute cholecystitis or biliary dilatation. The visualized portion of pancreas, spleen, and adrenal glands are normal in appearance. Tiny  renal cysts are noted are there is no evidence of renal mass or hydronephrosis. No soft tissue mass or lymphadenopathy identified within the abdomen or pelvis.  Tiny amount of gas is seen urinary bladder, likely from recent bladder catheterization. No intra-abdominal inflammatory process identified. No evidence of bowel wall thickening, dilatation, or hernia.  IMPRESSION: Left ventricular assist device, with small amount of pericardial fluid noted in the anterior pericardial recess.  Small left pleural effusion and left lower lobe atelectasis.  Small left rectus sheath hematoma.  No evidence of intra-abdominal inflammatory process or abscess.  Cholelithiasis. No radiographic evidence of cholecystitis.   Electronically Signed   By: Myles Rosenthal   On: 04/18/2013 14:59   Dg Chest Port 1 View  04/19/2013   *RADIOLOGY REPORT*  Clinical Data: Ventricular fibrillation  PORTABLE CHEST - 1 VIEW  Comparison: April 15, 2013.  Findings: Sternotomy wires are noted.  Stable mild cardiomegaly. Right-sided PICC line and left-sided pacemaker unchanged in position.  Left ventricular assistance device remains in place and is unchanged. Right lung is clear.  Left basilar opacity appears to be improved, although residual density and minimal effusion remains.  IMPRESSION: Improved left basilar opacity compared to prior exam.   Original Report Authenticated By: Lupita Raider.,  M.D.   Dg Abd Portable 1v  04/19/2013   *RADIOLOGY REPORT*  Clinical Data: Abdominal pain yesterday.  PORTABLE ABDOMEN - 1 VIEW  Comparison: CT abdomen and pelvis 04/18/2013  Findings: Residual contrast material in the colon.  No small or large bowel distension.  No radiopaque stones.  Calcified phleboliths in the pelvis.  Degenerative changes in the spine and hips.  Calcifications projected over the right lateral pelvic soft tissues likely represent injection granulomas.  IMPRESSION: Nonobstructive bowel gas pattern.  Residual contrast material in the  colon.   Original Report Authenticated By: Burman Nieves, M.D.      Medications:     Scheduled Medications: . amiodarone  200 mg Oral BID  . amLODipine  10 mg Oral Daily  . bisacodyl  10 mg Oral Daily   Or  . bisacodyl  10 mg Rectal Daily  . carvedilol  9.375 mg Oral BID WC  . cephALEXin  500 mg Oral Q8H  . cholecalciferol  1,000 Units Oral Daily  . digoxin  0.125 mg Oral Daily  . docusate sodium  200 mg Oral Daily  . feeding supplement  237 mL Oral BID BM  . ferrous fumarate-b12-vitamic C-folic acid  1 capsule Oral TID PC  . hydrALAZINE  25 mg Oral Q8H  . losartan  100 mg Oral Daily  . oxymetazoline  1 spray Each Nare BID  . pantoprazole  40 mg Oral Daily  . potassium chloride  20 mEq Oral Daily  . sodium chloride  3 mL Intravenous Q12H  . spironolactone  25 mg Oral Daily  . torsemide  20 mg Oral QODAY  . warfarin  2.5 mg Oral q1800  . Warfarin -  Pharmacist Dosing Inpatient   Does not apply q1800     Infusions: . sodium chloride 40 mL/hr at 04/20/13 0600     PRN Medications:  sodium chloride, acetaminophen, ALPRAZolam, bisacodyl, bisacodyl, fentaNYL, guaiFENesin, hydrALAZINE, magnesium hydroxide, ondansetron (ZOFRAN) IV, sodium chloride, traMADol, white petrolatum   Assessment:   Doing well POD 14. Abdominal pain resolving and likely due to rectus hematoma. The BRBPR may have been due to hemorrhoids. No further investigation needed at this time.  Plan/Discussion:    Continue mobilization with PT/OT. Probably home Thursday.  I reviewed the LVAD parameters from today, and compared the results to the patient's prior recorded data.  No programming changes were made.  The LVAD is functioning within specified parameters.  The patient performs LVAD self-test daily.  LVAD interrogation was negative for any significant power changes, alarms or PI events/speed drops.  LVAD equipment check completed and is in good working order.  Back-up equipment present.   LVAD education  done on emergency procedures and precautions and reviewed exit site care.  Length of Stay: 15  Sherilynn Dieu K 04/20/2013, 1:32 PM  VAD Team Pager [phone] (7am - 7am)

## 2013-04-20 NOTE — Progress Notes (Signed)
1430 Checked earlier and pt not up to walking. Busy going to bathroom from lasix. Now to get dressing changed by family. Will follow up if time permits. Luetta Nutting RNBSN

## 2013-04-20 NOTE — Progress Notes (Signed)
Patient ID: Brianna Hanson, female   DOB: 04/07/1935, 78 y.o.   MRN: [id number] HeartMate 2 Rounding Note  Subjective:    78 y/o female with severe CHF due to NICM, PAH, chronic AF underwent HM II VAD implant 04/06/13  POD #14. CT scan (8/17) for LUQ pain showed a small left rectus sheath hematoma. ASA stopped. Feeling much better and pain improving. Small amount frank blood with stool yesterday. Denies any orthopnea, SOB or edema. Minimal L pocket pain.   LVAD INTERROGATION:  HeartMate II LVAD:  Flow 4.7 liters/min, speed 8980, power 5.4, PI 6.6  No PI events   Objective:    Vital Signs:   Temp:  [97.8 F (36.6 C)-98.8 F (37.1 C)] 97.8 F (36.6 C) (08/19 0540) Weight:  [70.489 kg (155 lb 6.4 oz)] 70.489 kg (155 lb 6.4 oz) (08/19 0500) Last BM Date: 04/15/13 Mean arterial Pressure 88-96  Intake/Output:   Intake/Output Summary (Last 24 hours) at 04/20/13 0819 Last data filed at 04/20/13 0600  Gross per 24 hour  Intake 1164.67 ml  Output    701 ml  Net 463.67 ml     Physical Exam: General:  sitting in chair HEENT: Normal Neck: supple. JVP 6-7 . Carotids 2+ bilat; no bruits. No lymphadenopathy or thryomegaly appreciated. Cor: Mechanical heart sounds with LVAD hum present. Dressing c/d/i Lungs: clear Abdomen: soft, nondistended. + BS. Mildly tender in LUQ Driveline: C/D/I; securement device intact;  Extremities: no cyanosis, clubbing, rash, no edema  Neuro: awake alert. Pleasant. nonfocal  Telemetry: sinus 70-80s  Labs: Basic Metabolic Panel:  Recent Labs Lab 04/16/13 0500 04/17/13 0500 04/18/13 1040 04/19/13 0502 04/20/13 0550  NA 136 137 134* 134* 136  K 3.7 3.8 4.0 3.9 3.9  CL 100 100 100 99 104  CO2 26 26 26 24 23   GLUCOSE 89 89 95 88 77  BUN 7 7 8 8 8   CREATININE 0.86 0.80 0.79 0.91 0.88  CALCIUM 9.0 8.9 9.1 9.4 9.0    Liver Function Tests:  Recent Labs Lab 04/15/13 0500 04/19/13 0502  AST 29 26  ALT 17 16  ALKPHOS 90 89  BILITOT 0.6 0.7  PROT  6.4 7.7  ALBUMIN 2.5* 2.9*    Recent Labs Lab 04/19/13 0502  AMYLASE 92   No results found for this basename: AMMONIA,  in the last 168 hours  CBC:  Recent Labs Lab 04/15/13 0500 04/17/13 0500 04/18/13 1040 04/19/13 0502 04/20/13 0550  WBC 8.6 11.0* 10.9* 11.5* 9.5  HGB 8.7* 9.9* 9.6* 9.9* 9.5*  HCT 26.7* 29.3* 28.7* 30.7* 29.2*  MCV 93.4 92.4 92.9 93.6 93.9  PLT 262 361 375 406* 400    INR: 1.39  Recent Labs Lab 04/16/13 0500 04/17/13 0500 04/18/13 1040 04/19/13 0502 04/20/13 0550  INR 2.32* 2.44* 2.28* 2.28* 2.23*   Imaging: Ct Abdomen Pelvis W Contrast  04/18/2013   CLINICAL DATA:  Epigastric and abdominal pain, most severe in left upper quadrant. Postop from left ventricular assist device placement.  EXAM: CT ABDOMEN AND PELVIS WITH CONTRAST  TECHNIQUE: Multidetector CT imaging of the abdomen and pelvis was performed using the standard protocol following bolus administration of intravenous contrast.  CONTRAST:  OMNIPAQUE IOHEXOL 300 MG/ML  SOLN  COMPARISON:  01/28/2012  FINDINGS: Left ventricular assist device is seen in expected position, which causes considerable beam hardening artifact. A small amount of pericardial fluid is noted in the anterior pericardial recess. Small left pleural effusion is seen with left lower lobe atelectasis. No  evidence of abdominal or subdiaphragmatic fluid collections. A small hematoma is seen in the left lateral rectus abdominis muscles at the level of the anterior superior iliac spine which measures approximately 2.7 x 4.8 cm.  Tiny central hepatic cyst is seen but no liver masses are identified. Tiny calcified gallstones noted, however there is no evidence of acute cholecystitis or biliary dilatation. The visualized portion of pancreas, spleen, and adrenal glands are normal in appearance. Tiny renal cysts are noted are there is no evidence of renal mass or hydronephrosis. No soft tissue mass or lymphadenopathy identified within the  abdomen or pelvis.  Tiny amount of gas is seen urinary bladder, likely from recent bladder catheterization. No intra-abdominal inflammatory process identified. No evidence of bowel wall thickening, dilatation, or hernia.  IMPRESSION: Left ventricular assist device, with small amount of pericardial fluid noted in the anterior pericardial recess.  Small left pleural effusion and left lower lobe atelectasis.  Small left rectus sheath hematoma.  No evidence of intra-abdominal inflammatory process or abscess.  Cholelithiasis. No radiographic evidence of cholecystitis.   Electronically Signed   By: Myles Rosenthal   On: 04/18/2013 14:59   Dg Chest Port 1 View  04/19/2013   *RADIOLOGY REPORT*  Clinical Data: Ventricular fibrillation  PORTABLE CHEST - 1 VIEW  Comparison: April 15, 2013.  Findings: Sternotomy wires are noted.  Stable mild cardiomegaly. Right-sided PICC line and left-sided pacemaker unchanged in position.  Left ventricular assistance device remains in place and is unchanged. Right lung is clear.  Left basilar opacity appears to be improved, although residual density and minimal effusion remains.  IMPRESSION: Improved left basilar opacity compared to prior exam.   Original Report Authenticated By: Lupita Raider.,  M.D.   Dg Abd Portable 1v  04/19/2013   *RADIOLOGY REPORT*  Clinical Data: Abdominal pain yesterday.  PORTABLE ABDOMEN - 1 VIEW  Comparison: CT abdomen and pelvis 04/18/2013  Findings: Residual contrast material in the colon.  No small or large bowel distension.  No radiopaque stones.  Calcified phleboliths in the pelvis.  Degenerative changes in the spine and hips.  Calcifications projected over the right lateral pelvic soft tissues likely represent injection granulomas.  IMPRESSION: Nonobstructive bowel gas pattern.  Residual contrast material in the colon.   Original Report Authenticated By: Burman Nieves, M.D.    Medications:     Scheduled Medications: . amiodarone  200 mg Oral BID   . amLODipine  10 mg Oral Daily  . bisacodyl  10 mg Oral Daily   Or  . bisacodyl  10 mg Rectal Daily  . carvedilol  9.375 mg Oral BID WC  . cholecalciferol  1,000 Units Oral Daily  . digoxin  0.125 mg Oral Daily  . docusate sodium  200 mg Oral Daily  . feeding supplement  237 mL Oral BID BM  . ferrous fumarate-b12-vitamic C-folic acid  1 capsule Oral TID PC  . hydrALAZINE  25 mg Oral Q8H  . losartan  100 mg Oral Daily  . oxymetazoline  1 spray Each Nare BID  . pantoprazole  40 mg Oral Daily  . potassium chloride  20 mEq Oral Daily  . sodium chloride  3 mL Intravenous Q12H  . spironolactone  25 mg Oral Daily  . torsemide  20 mg Oral QODAY  . warfarin  2.5 mg Oral q1800  . Warfarin - Pharmacist Dosing Inpatient   Does not apply q1800    Infusions: . sodium chloride 40 mL/hr at 04/20/13 0600  PRN Medications: sodium chloride, acetaminophen, ALPRAZolam, bisacodyl, bisacodyl, fentaNYL, guaiFENesin, hydrALAZINE, magnesium hydroxide, ondansetron (ZOFRAN) IV, sodium chloride, traMADol, white petrolatum   Assessment:   1. Acute on chronic systolic HF due to NICM     --s/p HM II VAD on 04/06/13 2. PAH 3. Acute and chronic AF  - now in NSR 4. VT 5. Hypokalemia/hypomagnesemia 6. Epistaxis - resolved 7. Ab pain d/t rectus sheath hematoma  Plan/Discussion:    POD # 14. Doing great. MAPs still elevated upper 80-90s. Will increase coreg to 9.375 mg BID. Will stop revatio. No more BRBPR and Hgb stable, start ASA tomorrow low dose. INR stable. Maintaining SR on amio.   Hopefully home Thursday. Continue to work with PT/OT.   I reviewed the LVAD parameters from today, and compared the results to the patient's prior recorded data.  No programming changes were made.  The LVAD is functioning within specified parameters.  The patient performs LVAD self-test daily.  LVAD interrogation was negative for any significant power changes, alarms or PI events/speed drops.  LVAD equipment check  completed and is in good working order.  Back-up equipment present.   LVAD education done on emergency procedures and precautions and reviewed exit site care.  Length of Stay: 15  Jaelynn Pozo BensimhonMD 04/20/2013, 8:19 AM  Advanced Heart Failure Team Pager [phone] (M-F; 7a - 4p)  Please contact  Cardiology for night-coverage after hours (4p -7a ) and weekends on [email or web address]  VAD Team Pager [phone] (7am - 7am)  For VAD issues only

## 2013-04-20 NOTE — Progress Notes (Signed)
Occupational Therapy Treatment Patient Details Name: BETRICE WANAT MRN: [id number] DOB: 24-May-1935 Today's Date: 04/20/2013 Time: 0924-1000 OT Time Calculation (min): 36 min  OT Assessment / Plan / Recommendation  History of present illness 78 year old female with heart failure due severe non-ischemic cardiomyopathy on milrinone and revatio scheduled for Heartmate II LVAD tomorrow for destination therapy   OT comments  Pt making good progress, but continues to be limited by poor endurance. When pt fatigues, she has difficulty adequately managing the equipment. Discussed importance of maintaining 24/7 assistance until pt is able to switch power sources and manage equipment "automatically" without any assistance. Caregiver verbalized understanding.  Pt appropriate for D/C Thursday from OT standpoint and continue with HHOT. Also discussed need for caregiver to purchase rollator and rolling cart for LVAD equipment  prior to D/C home.   Follow Up Recommendations  Home health OT    Barriers to Discharge       Equipment Recommendations  Other (comment);3 in 1 bedside comode    Recommendations for Other Services    Frequency Min 3X/week   Progress towards OT Goals Progress towards OT goals: Progressing toward goals  Plan Discharge plan remains appropriate    Precautions / Restrictions Precautions Precautions: Sternal;Fall Restrictions Weight Bearing Restrictions: Yes (Sternal Precautions)   Pertinent Vitals/Pain no apparent distress     ADL  ADL Comments: Pt states she dressed herself today after the tech set her up with the AE. Pt worked on switching back and forth from power source to battery and ambulatin g whilemanaging cord    OT Diagnosis:    OT Problem List:   OT Treatment Interventions:     OT Goals(current goals can now be found in the care plan section) Acute Rehab OT Goals Patient Stated Goal: to go home OT Goal Formulation: With patient Time For Goal Achievement:  04/22/13 Potential to Achieve Goals: Good ADL Goals Pt Will Perform Grooming: with modified independence;standing Pt Will Perform Upper Body Bathing: with supervision;with set-up;with caregiver independent in assisting;sitting Pt Will Perform Lower Body Bathing: with set-up;with supervision;with caregiver independent in assisting;with adaptive equipment;sit to/from stand Pt Will Perform Upper Body Dressing: with supervision;with set-up;with caregiver independent in assisting;sitting Pt Will Transfer to Toilet: with supervision;ambulating;bedside commode Pt Will Perform Toileting - Clothing Manipulation and hygiene: with modified independence;sitting/lateral leans;sit to/from stand Additional ADL Goal #1: independent withsternal precautions during ADL and mobility for ADL  Visit Information  Last OT Received On: 04/20/13 Assistance Needed: +1 PT/OT Co-Evaluation/Treatment: Yes History of Present Illness: 78 year old female with heart failure due severe non-ischemic cardiomyopathy on milrinone and revatio scheduled for Heartmate II LVAD tomorrow for destination therapy    Subjective Data      Prior Functioning       Cognition  Cognition Arousal/Alertness: Awake/alert Behavior During Therapy: WFL for tasks assessed/performed Overall Cognitive Status: Within Functional Limits for tasks assessed    Mobility  Bed Mobility Bed Mobility: Not assessed Transfers Transfers: Sit to Stand;Stand to Sit Sit to Stand: 5: Supervision Stand to Sit: 5: Supervision Details for Transfer Assistance: no vc for hand placement today    Exercises      Balance  S   End of Session OT - End of Session Activity Tolerance: Patient limited by fatigue Patient left: in chair;with call bell/phone within reach;with family/visitor present Nurse Communication: Mobility status  GO     Cassadie Pankonin,HILLARY 04/20/2013, 1:19 PM [facility], OTR/L  [phone] 04/20/2013

## 2013-04-20 NOTE — Progress Notes (Signed)
ANTICOAGULATION - Follow Up Consult  Pharmacy Consult for Warfarin  Indication: atrial fibrillation   No Known Allergies  Patient Measurements: Height: 4' 11.84" (152 cm) Weight: 155 lb 6.4 oz (70.489 kg) IBW/kg (Calculated) : 45.14  Vital Signs: Temp: 97.8 F (36.6 C) (08/19 0540)  Labs:  Recent Labs  04/18/13 1040 04/19/13 0502 04/20/13 0550  HGB 9.6* 9.9* 9.5*  HCT 28.7* 30.7* 29.2*  PLT 375 406* 400  LABPROT 24.4* 24.4* 24.0*  INR 2.28* 2.28* 2.23*  CREATININE 0.79 0.91 0.88   Estimated Creatinine Clearance: 47.5 ml/min (by C-G formula based on Cr of 0.88).  Medications:  Prescriptions prior to admission  Medication Sig Dispense Refill  . amiodarone (PACERONE) 200 MG tablet Take 200 mg by mouth daily.      . Cholecalciferol (VITAMIN D3) 1000 UNITS CAPS Take 1 tablet by mouth daily.        . furosemide (LASIX) 80 MG tablet Take 80 mg by mouth 2 (two) times daily.      [name] losartan (COZAAR) 50 MG tablet Take 50 mg by mouth daily.      . milrinone (PRIMACOR) 20 MG/100ML SOLN infusion Inject 28.7625 mcg/min into the vein continuous.  100 mL  6  . potassium chloride SA (K-DUR,KLOR-CON) 20 MEQ tablet Take 20 mEq by mouth 2 (two) times daily.      . sildenafil (REVATIO) 20 MG tablet Take 60 mg by mouth 3 (three) times daily.      [name] warfarin (COUMADIN) 2.5 MG tablet Take 2.5 mg by mouth daily.       Assessment: 78 yo F with PMH of Afib and HF d/t severe NICM who presented for LVAD placement on 04/06/13.   WARFARIN: Warfarin was restarted on 8/6 (MD dosing) with BL INR of 1.44. Pharmacy consulted for follow up dosing.  Patient had an episode of epistaxis on 8/12 which lasted >15 mins but was stabilized with cauterization by ENT (pt states she has a h/o epistaxis on warfarin). Had some sporadic epistaxis on 8/14 but resolved adequately.  Small rectus sheath hematoma as well as a small amount of frank blood in the stool noted yesterday.  ASA was stopped. INR has leveled out,  today therapeutic again at 2.23 and will maintain goal closer to 2.  H/H remains stable although chronic anemia is noted.  HOME Dose:  On warfarin at 2.5 mg po daily.   DRUG/DRUG Interactions: Amiodarone may inhibit hepatic metabolism and increase the anticoagulant effect of warfarin. Bleeding may occur.   Goal of Therapy:  INR 2-3 (~ 2.0 per MD due to small rectus sheath hematoma) Monitor platelets by anticoagulation protocol: Yes   Plan:  - Warfarin 2.5 mg po daily - Monitor INR, CBC, s/s bleeding   Margie Billet, PharmD Clinical Pharmacist - Resident Pager: [phone] Pharmacy: [phone] 04/20/2013 10:01 AM

## 2013-04-20 NOTE — Progress Notes (Signed)
Discharge VAD teaching continued with patient and caregivers (husband and sister).  [name] family has completed a home inspection checklist, this has been reviewed and no unsafe conditions have been identified.  Family has ensured two dedicated grounded, 3-prong outlets with clearly labeled circuit breaker has been established in the bedroom for power module and Magazine features editor.  Both patient and caregiver(s) have been trained on the following:  ____ Overview of major warnings and cautions  ____ HM II LVAD overview of system operation  ____    Overview of system components (features and functions) ____ Changing power source ____ Overview of alerts and alarms ____ How to identify an emergency ____ How to handle an emergency including when the pump is running and when the pump has stopped  ____ Changing system controller ____ Maintain emergency contact list  The patient and caregiver(s) have successfully demonstrated:  ____ Changing power source (from batteries to power module, power module to  batteries, and replacing batteries) ____ Perform system controller self test  ____ Perform power module self test  ____ Check and charge batteries  ____ Change system controller  A daily flow sheet with patient  weight, temperature,  flow, speed, power, and PI, along with daily self checks on system controller and power module have been performed by patient and caregiver(s) during hospitalization and will also be done daily at home.   The husband performed percutaneous lead exit site care and dressing change today; sister observed with sterile technique explained and demonstrated.  Instructed to continue to perform dressing changes during patient's hospitalization under nursing supervision. The importance of lead immobilization has been stressed to patient and caregiver(s) using the attachment device. The caregiver has successfully demonstrated the  following: ____ Cleansing ____ Dressing ____ Immobilizing lead  The following routine activities and maintenance have been reviewed with patient and caregiver(s) and both verbalize understanding:  ____ Stressed importance of never disconnecting power from both controller power leads at the same time, and never disconnecting both batteries at the same time, or the pump will stop ____ Plug the power module (PM) or the universal Magazine features editor (UBC) into properly grounded (3 prong) outlets dedicated to PM use. Do NOT use adapter (cheater plug) for ungrounded outlets or multiple portable socket outlets (power strips) ____ Do not connect the PM or UBC to an outlet controlled by wall switch or the device may not work ____ The PM's internal battery (that provides limited backup power to the LVAD in the event of AC mains power failure) remains charged as long as the PM is connected to [facility] or DC power ____ The PM's internal battery provides approximately 30 minutes of emergency backup power for the LVAD ____ Transfer from PM to batteries during [facility] mains power failure. The PM has internal backup battery that will power the pump while you transfer to batteries ____ Keep a backup system controller, charged batteries, battery clips, and             flashlight near you during sleep in case of electrical power outage ____ Clean battery, battery clip, and universal battery charger contacts weekly ____ Visually inspect percutaneous lead daily ____ Check cables and connectors when changing power source  ____    Rotate batteries; keep all eight batteries charged ____ Always have backup system controller, battery clips, fully charged batteries, and spare fully charged batteries when traveling ____ Re-calibrate batteries every 70 uses; monitor battery life of 36 months or 360 uses; replace batteries at end of battery  life   Identified the following changes in activities of daily living with pump:  ____ No  driving for at least six weeks and then only if doctor gives    permission to do so ____ No tub baths while pump implanted, and shower only if doctor gives    permission ____    No swimming or submersion in water while implanted with pump ____    Keep all VAD equipment away from water or moisture ____ Keep all VAD connections clean and dry ____ No contact sports or engage in jumping activities ____ Avoid strong static electricity (touching TV/computer screens, vacuuming) ____ Never have an MRI while implanted with the pump ____ Never leave or store batteries in extremely hot or cold places (such as   trunk of your car), or the battery life will be shortened ____ Call the doctor or hospital contact person if any change in how the pump            sounds, feels, or works ____ Plan to sleep only when connected to the power module. ____    Keep a backup system controller, charged batteries, battery clips, and             flashlight near you during sleep in case of electrical power outage ____ Do not sleep on your stomach ____ Talk with doctor before any long distance travel plans   Discharge binder given to patient and include the following:  ____ Discharge instructions sheet ____ List of emergency contacts ____ Barry Brunner card ____    HM II Luggage tags ____ Guide to Percutaneous Lead Care and Equipment Maintenance ____ HM II Alarms for Patients and their Caregivers ____ HM II Patient Handbook ____ HM II Patient Education Program DVD ____ HM II Information and Emergency Assistance Guide ____ HM II Power Module Alarms, Care, Maintenance ____ Daily diary sheets ____    [facility] VAD Patient Education Booklet ____    Care of the Percutaneous Lead ____    HM II LVAS Emergency Response Checklist   Will continue ongoing education with pt and caregivers during remainder of hospitalization.

## 2013-04-21 ENCOUNTER — Encounter: Payer: Self-pay | Admitting: *Deleted

## 2013-04-21 ENCOUNTER — Encounter (HOSPITAL_COMMUNITY): Payer: Self-pay | Admitting: *Deleted

## 2013-04-21 DIAGNOSIS — Z95818 Presence of other cardiac implants and grafts: Secondary | ICD-10-CM

## 2013-04-21 DIAGNOSIS — I5023 Acute on chronic systolic (congestive) heart failure: Secondary | ICD-10-CM

## 2013-04-21 LAB — BASIC METABOLIC PANEL
BUN: 10 mg/dL (ref 6–23)
CO2: 23 mEq/L (ref 19–32)
Calcium: 9.2 mg/dL (ref 8.4–10.5)
Chloride: 102 mEq/L (ref 96–112)
Creatinine, Ser: 0.97 mg/dL (ref 0.50–1.10)
GFR calc Af Amer: 64 mL/min — ABNORMAL LOW (ref >90)
GFR calc non Af Amer: 55 mL/min — ABNORMAL LOW (ref >90)
Glucose, Bld: 87 mg/dL (ref 70–99)
Potassium: 3.5 mEq/L (ref 3.5–5.1)
Sodium: 137 mEq/L (ref 135–145)

## 2013-04-21 LAB — CBC
HCT: 29 % — ABNORMAL LOW (ref 36.0–46.0)
Hemoglobin: 9.8 g/dL — ABNORMAL LOW (ref 12.0–15.0)
MCH: 31.3 pg (ref 26.0–34.0)
MCHC: 33.8 g/dL (ref 30.0–36.0)
MCV: 92.7 fL (ref 78.0–100.0)
Platelets: 407 10*3/uL — ABNORMAL HIGH (ref 150–400)
RBC: 3.13 MIL/uL — ABNORMAL LOW (ref 3.87–5.11)
RDW: 17.7 % — ABNORMAL HIGH (ref 11.5–15.5)
WBC: 8.9 10*3/uL (ref 4.0–10.5)

## 2013-04-21 LAB — LACTATE DEHYDROGENASE: LDH: 421 U/L — ABNORMAL HIGH (ref 94–250)

## 2013-04-21 LAB — PROTIME-INR
INR: 2.49 — ABNORMAL HIGH (ref 0.00–1.49)
Prothrombin Time: 26.1 seconds — ABNORMAL HIGH (ref 11.6–15.2)

## 2013-04-21 MED ORDER — POTASSIUM CHLORIDE CRYS ER 20 MEQ PO TBCR
40.0000 meq | EXTENDED_RELEASE_TABLET | Freq: Once | ORAL | Status: AC
  Administered 2013-04-21: 40 meq via ORAL

## 2013-04-21 MED ORDER — AMIODARONE HCL 200 MG PO TABS
200.0000 mg | ORAL_TABLET | Freq: Every day | ORAL | Status: DC
  Administered 2013-04-21 – 2013-04-22 (×2): 200 mg via ORAL
  Filled 2013-04-21 (×2): qty 1

## 2013-04-21 MED ORDER — POTASSIUM CHLORIDE CRYS ER 20 MEQ PO TBCR
EXTENDED_RELEASE_TABLET | ORAL | Status: AC
  Filled 2013-04-21: qty 2

## 2013-04-21 NOTE — Progress Notes (Signed)
Patient ID: Brianna Hanson, female   DOB: [birth year], 78 y.o.   MRN: [id number] HeartMate 2 Rounding Note  Subjective:    78 y/o female with severe CHF due to NICM, PAH, chronic AF underwent HM II VAD implant 04/06/13  POD #76 CT scan (8/17) for LUQ pain showed a small left rectus sheath hematoma. ASA was stopped. Still with some L side pain improving with tramadol. Denies any orthopnea, SOB or edema.   LVAD INTERROGATION:  HeartMate II LVAD:  Flow 4.7 liters/min, speed 8980, power 5.4, PI 6.6  No PI events   Objective:    Vital Signs:   Temp:  [98 F (36.7 C)-98.8 F (37.1 C)] 98.8 F (37.1 C) (08/19 1958) Resp:  [18] 18 (08/19 1958) SpO2:  [98 %] 98 % (08/19 1958) Weight:  [68.6 kg (151 lb 3.8 oz)] 68.6 kg (151 lb 3.8 oz) (08/20 0500) Last BM Date: 04/15/13 Mean arterial Pressure 86-92  Intake/Output:   Intake/Output Summary (Last 24 hours) at 04/21/13 0901 Last data filed at 04/21/13 0730  Gross per 24 hour  Intake    720 ml  Output   1200 ml  Net   -480 ml     Physical Exam: General:  sitting in chair HEENT: Normal Neck: supple. JVP 6-7 . Carotids 2+ bilat; no bruits. No lymphadenopathy or thryomegaly appreciated. Cor: Mechanical heart sounds with LVAD hum present. Dressing c/d/i Lungs: clear Abdomen: soft, nondistended. + BS. Mildly tender in LUQ Driveline: C/D/I; securement device intact;  Extremities: no cyanosis, clubbing, rash, no edema  Neuro: awake alert. Pleasant. nonfocal  Telemetry: sinus 80s  Labs: Basic Metabolic Panel:  Recent Labs Lab 04/17/13 0500 04/18/13 1040 04/19/13 0502 04/20/13 0550 04/21/13 0535  NA 137 134* 134* 136 137  K 3.8 4.0 3.9 3.9 3.5  CL 100 100 99 104 102  CO2 26 26 24 23 23   GLUCOSE 89 95 88 77 87  BUN 7 8 8 8 10   CREATININE 0.80 0.79 0.91 0.88 0.97  CALCIUM 8.9 9.1 9.4 9.0 9.2    Liver Function Tests:  Recent Labs Lab 04/15/13 0500 04/19/13 0502  AST 29 26  ALT 17 16  ALKPHOS 90 89  BILITOT 0.6 0.7  PROT  6.4 7.7  ALBUMIN 2.5* 2.9*    Recent Labs Lab 04/19/13 0502  AMYLASE 92   No results found for this basename: AMMONIA,  in the last 168 hours  CBC:  Recent Labs Lab 04/17/13 0500 04/18/13 1040 04/19/13 0502 04/20/13 0550 04/21/13 0535  WBC 11.0* 10.9* 11.5* 9.5 8.9  HGB 9.9* 9.6* 9.9* 9.5* 9.8*  HCT 29.3* 28.7* 30.7* 29.2* 29.0*  MCV 92.4 92.9 93.6 93.9 92.7  PLT 361 375 406* 400 407*    INR: 1.39  Recent Labs Lab 04/17/13 0500 04/18/13 1040 04/19/13 0502 04/20/13 0550 04/21/13 0535  INR 2.44* 2.28* 2.28* 2.23* 2.49*   Imaging: No results found.  Medications:     Scheduled Medications: . amiodarone  200 mg Oral Daily  . amLODipine  10 mg Oral Daily  . bisacodyl  10 mg Oral Daily   Or  . bisacodyl  10 mg Rectal Daily  . carvedilol  9.375 mg Oral BID WC  . cephALEXin  500 mg Oral Q8H  . cholecalciferol  1,000 Units Oral Daily  . digoxin  0.125 mg Oral Daily  . docusate sodium  200 mg Oral Daily  . feeding supplement  237 mL Oral BID BM  . ferrous fumarate-b12-vitamic C-folic acid  1 capsule Oral TID PC  . hydrALAZINE  25 mg Oral Q8H  . losartan  100 mg Oral Daily  . oxymetazoline  1 spray Each Nare BID  . pantoprazole  40 mg Oral Daily  . potassium chloride  20 mEq Oral Daily  . potassium chloride  40 mEq Oral Once  . sodium chloride  3 mL Intravenous Q12H  . spironolactone  25 mg Oral Daily  . torsemide  20 mg Oral QODAY  . warfarin  2.5 mg Oral q1800  . Warfarin - Pharmacist Dosing Inpatient   Does not apply q1800    Infusions: . sodium chloride 40 mL/hr at 04/20/13 0600    PRN Medications: sodium chloride, acetaminophen, ALPRAZolam, bisacodyl, bisacodyl, fentaNYL, guaiFENesin, hydrALAZINE, magnesium hydroxide, ondansetron (ZOFRAN) IV, sodium chloride, traMADol, white petrolatum   Assessment:   1. Acute on chronic systolic HF due to NICM     --s/p HM II VAD on 04/06/13 2. PAH 3. Acute and chronic AF  - now in NSR 4. VT 5.  Hypokalemia/hypomagnesemia 6. Epistaxis - resolved 7. Ab pain d/t rectus sheath hematoma 8. UTI  Plan/Discussion:    POD # 15 Continues to improve and is getting stronger. Started on Keflex for asymptomatic UTI yesterday, + citrobacter koseri.  MAPs 88-92, increased coreg yesterday to 9.375 BID. Will continue to watch and if remains on the elevated side can titrate hydralazine. SR 80s, will decrease amiodarone to 200 mg daily.   Weight down 4 lbs, will continue demadex 20 mg QOD. INR therapeutic 2.49 with no s/s of bleeding. Will try to start low dose ASA today.   Continue ultram for pain.  Education complete. Continue to work with PT today and try to do some stairs if capable. Likely home tomorrow.   I reviewed the LVAD parameters from today, and compared the results to the patient's prior recorded data.  No programming changes were made.  The LVAD is functioning within specified parameters.  The patient performs LVAD self-test daily.  LVAD interrogation was negative for any significant power changes, alarms or PI events/speed drops.  LVAD equipment check completed and is in good working order.  Back-up equipment present.   LVAD education done on emergency procedures and precautions and reviewed exit site care.  Length of Stay: 35  Bilal Manzer BensimhonMD 04/21/2013, 9:01 AM  Advanced Heart Failure Team Pager [phone] (M-F; 7a - 4p)  Please contact [facility] for night-coverage after hours (4p -7a ) and weekends on [email or web address]  VAD Team Pager [phone] (7am - 7am)  For VAD issues only

## 2013-04-21 NOTE — Progress Notes (Signed)
CARDIAC REHAB PHASE I   PRE:  Rate/Rhythm: 62 paced/SR  BP:  Supine:   Sitting: 86 dopplered  Standing:    SaO2: 96%RA  MODE:  Ambulation: 490 ft   POST:  Rate/Rhythm: 75  BP:  Supine:   Sitting: 88/dopplered  Standing:    SaO2: 89-90%RA 1410-1453 Pt walked 490 ft on RA with rollator and asst x 1 with steady gait. Did not need to stop and rest. To bathroom after walk and then to recliner. Back to power module after walk. Tolerated well.   Luetta Nutting, RN BSN  04/21/2013 2:49 PM

## 2013-04-21 NOTE — Progress Notes (Signed)
Physical Therapy Treatment Patient Details Name: Brianna Hanson MRN: [id number] DOB: 1935/06/11 Today's Date: 04/21/2013  Brianna Hanson demonstrates good overall functional mobility at supervision levels. She was able to perform transition to battery source with significant other providing assist as needed. Patient ambulating well. Educated patient on technique for car transfers as well as expectations for home mobility. At this time, feel patient will be safe for dc home with family assist.  It has been a pleasure to provide therapy services to Brianna Hanson. Continue to recommend HHPT consult upon discharge and use of rollator walker.       04/21/13 1000  PT Visit Information  Last PT Received On 04/21/13  Assistance Needed +1  History of Present Illness 78 year old female with heart failure due severe non-ischemic cardiomyopathy on milrinone and revatio scheduled for Heartmate II LVAD tomorrow for destination therapy  PT Time Calculation  PT Start Time 0802  PT Stop Time 0843  PT Time Calculation (min) 41 min  Subjective Data  Subjective I dont feel great  Patient Stated Goal to go home  Precautions  Precautions Sternal;Fall  Restrictions  Weight Bearing Restrictions Yes (Sternal Precautions)  Cognition  Arousal/Alertness Awake/alert  Behavior During Therapy WFL for tasks assessed/performed  Overall Cognitive Status Within Functional Limits for tasks assessed  Bed Mobility  Bed Mobility Not assessed  Transfers  Transfers Sit to Stand;Stand to Sit  Sit to Stand 5: Supervision  Stand to Sit 5: Supervision  Details for Transfer Assistance VCs for precautions, as patient becomes more fatigue, patient demonstrates unsafe behaviors with mobility including not locking the rollator, non-compliance with precautions and poor problem solving through conversion  Ambulation/Gait  Ambulation/Gait Assistance 5: Supervision  Ambulation Distance (Feet) 400 Feet  Assistive device Other  (Comment) (rollator)  Ambulation/Gait Assistance Details no rest breaks needed  Gait Pattern Step-through pattern  Gait velocity decreased  Static Sitting Balance  Static Sitting - Balance Support Feet supported  Static Sitting - Level of Assistance 7: Independent  PT - End of Session  Equipment Utilized During Treatment Other (comment) (LVAD equipment)  Activity Tolerance Patient limited by fatigue  Patient left in chair;with call bell/phone within reach;with family/visitor present  Nurse Communication Mobility status  PT - Assessment/Plan  PT Plan Current plan remains appropriate  PT Frequency Min 5X/week  Follow Up Recommendations Home health PT;Supervision/Assistance - 24 hour  PT equipment Other (comment) (rollator walker)  PT Goal Progression  Progress towards PT goals Progressing toward goals  Acute Rehab PT Goals  PT Goal Formulation With patient  Time For Goal Achievement 04/22/13  Potential to Achieve Goals Good  PT General Charges  $$ ACUTE PT VISIT 1 Procedure  PT Treatments  $Gait Training 8-22 mins  $Therapeutic Activity 8-22 mins  $Self Care/Home Management 8-22

## 2013-04-21 NOTE — Progress Notes (Signed)
Patient ID: LYFE REIHL, female   DOB: 01-09-1935, 78 y.o.   MRN: [id number]  HeartMate 2 Rounding Note  Subjective:    POD #15. CT scan (8/17) for LUQ pain showed a small left rectus sheath hematoma Having some discomfort in LUQ beneath left costal margin. Improved with pain meds.   LVAD INTERROGATION:  HeartMate II LVAD: Flow 4.7 liters/min, speed 8980, power 5.4, PI 6.6 No PI events    Objective:    Vital Signs:   Temp:  [98.8 F (37.1 C)] 98.8 F (37.1 C) (08/19 1958) Resp:  [18] 18 (08/19 1958) SpO2:  [98 %] 98 % (08/19 1958) Weight:  [151 lb 3.8 oz (68.6 kg)] 151 lb 3.8 oz (68.6 kg) (08/20 0500) Last BM Date: 04/15/13 Mean arterial Pressure 86-92  Intake/Output:   Intake/Output Summary (Last 24 hours) at 04/21/13 1451 Last data filed at 04/21/13 1221  Gross per 24 hour  Intake    600 ml  Output    600 ml  Net      0 ml     Physical Exam: General: Well appearing. No resp difficulty  HEENT: normal  Neck: supple. JVP . Carotids 2+ bilat; no bruits. No lymphadenopathy or thryomegaly appreciated.  Cor: LVAD hum present.  Lungs: clear  Abdomen: soft,minimal left upper quadrant tenderness, nondistended. No hepatosplenomegaly. No bruits or masses. Good bowel sounds.  Driveline: C/D/I; securement device intact and driveline incorporated  Extremities: no cyanosis, clubbing, rash, edema  Neuro: alert & orientedx3, moves all 4 extremities w/o difficulty. Affect pleasant  Telemetry: sinus  Labs: Basic Metabolic Panel:  Recent Labs Lab 04/17/13 0500 04/18/13 1040 04/19/13 0502 04/20/13 0550 04/21/13 0535  NA 137 134* 134* 136 137  K 3.8 4.0 3.9 3.9 3.5  CL 100 100 99 104 102  CO2 26 26 24 23 23   GLUCOSE 89 95 88 77 87  BUN 7 8 8 8 10   CREATININE 0.80 0.79 0.91 0.88 0.97  CALCIUM 8.9 9.1 9.4 9.0 9.2    Liver Function Tests:  Recent Labs Lab 04/15/13 0500 04/19/13 0502  AST 29 26  ALT 17 16  ALKPHOS 90 89  BILITOT 0.6 0.7  PROT 6.4 7.7  ALBUMIN  2.5* 2.9*    Recent Labs Lab 04/19/13 0502  AMYLASE 92   No results found for this basename: AMMONIA,  in the last 168 hours  CBC:  Recent Labs Lab 04/17/13 0500 04/18/13 1040 04/19/13 0502 04/20/13 0550 04/21/13 0535  WBC 11.0* 10.9* 11.5* 9.5 8.9  HGB 9.9* 9.6* 9.9* 9.5* 9.8*  HCT 29.3* 28.7* 30.7* 29.2* 29.0*  MCV 92.4 92.9 93.6 93.9 92.7  PLT 361 375 406* 400 407*    INR:  Recent Labs Lab 04/17/13 0500 04/18/13 1040 04/19/13 0502 04/20/13 0550 04/21/13 0535  INR 2.44* 2.28* 2.28* 2.23* 2.49*   LDH 392 <547 <550< 423<424<404>421  Other results:  EKG:   Imaging:  No results found.   Medications:     Scheduled Medications: . amiodarone  200 mg Oral Daily  . amLODipine  10 mg Oral Daily  . bisacodyl  10 mg Oral Daily   Or  . bisacodyl  10 mg Rectal Daily  . carvedilol  9.375 mg Oral BID WC  . cephALEXin  500 mg Oral Q8H  . cholecalciferol  1,000 Units Oral Daily  . digoxin  0.125 mg Oral Daily  . docusate sodium  200 mg Oral Daily  . feeding supplement  237 mL Oral BID BM  .  ferrous fumarate-b12-vitamic C-folic acid  1 capsule Oral TID PC  . hydrALAZINE  25 mg Oral Q8H  . losartan  100 mg Oral Daily  . oxymetazoline  1 spray Each Nare BID  . pantoprazole  40 mg Oral Daily  . potassium chloride  20 mEq Oral Daily  . potassium chloride  40 mEq Oral Once  . sodium chloride  3 mL Intravenous Q12H  . spironolactone  25 mg Oral Daily  . torsemide  20 mg Oral QODAY  . warfarin  2.5 mg Oral q1800  . Warfarin - Pharmacist Dosing Inpatient   Does not apply q1800     Infusions: . sodium chloride 40 mL/hr at 04/20/13 0600     PRN Medications:  sodium chloride, acetaminophen, ALPRAZolam, bisacodyl, bisacodyl, fentaNYL, guaiFENesin, hydrALAZINE, magnesium hydroxide, ondansetron (ZOFRAN) IV, sodium chloride, traMADol, white petrolatum   Assessment:   POD # 15 Continues to improve and is getting stronger. Started on Keflex for asymptomatic UTI  yesterday, + citrobacter koseri. MAPs 88-92, increased coreg yesterday to 9.375 BID. No further rectal bleeding. ASA started today. INR 2.9.  Plan/Discussion:     Plan home tomorrow if no changes.  I reviewed the LVAD parameters from today, and compared the results to the patient's prior recorded data.  No programming changes were made.  The LVAD is functioning within specified parameters.  The patient performs LVAD self-test daily.  LVAD interrogation was negative for any significant power changes, alarms or PI events/speed drops.  LVAD equipment check completed and is in good working order.  Back-up equipment present.   LVAD education done on emergency procedures and precautions and reviewed exit site care.  Length of Stay: 16  Alleen Borne 04/21/2013, 2:51 PM  VAD Team Pager [phone] (7am - 7am)

## 2013-04-22 ENCOUNTER — Encounter (HOSPITAL_COMMUNITY): Payer: Self-pay | Admitting: *Deleted

## 2013-04-22 ENCOUNTER — Other Ambulatory Visit: Payer: Self-pay

## 2013-04-22 ENCOUNTER — Other Ambulatory Visit: Payer: Self-pay | Admitting: Internal Medicine

## 2013-04-22 LAB — BASIC METABOLIC PANEL
BUN: 8 mg/dL (ref 6–23)
CO2: 23 mEq/L (ref 19–32)
Calcium: 8.8 mg/dL (ref 8.4–10.5)
Chloride: 105 mEq/L (ref 96–112)
Creatinine, Ser: 0.73 mg/dL (ref 0.50–1.10)
GFR calc Af Amer: >90 mL/min (ref >90)
GFR calc non Af Amer: 81 mL/min — ABNORMAL LOW (ref >90)
Glucose, Bld: 78 mg/dL (ref 70–99)
Potassium: 4.2 mEq/L (ref 3.5–5.1)
Sodium: 136 mEq/L (ref 135–145)

## 2013-04-22 LAB — PROTIME-INR
INR: 2.76 — ABNORMAL HIGH (ref 0.00–1.49)
Prothrombin Time: 28.2 seconds — ABNORMAL HIGH (ref 11.6–15.2)

## 2013-04-22 LAB — CBC
HCT: 29.4 % — ABNORMAL LOW (ref 36.0–46.0)
Hemoglobin: 9.7 g/dL — ABNORMAL LOW (ref 12.0–15.0)
MCH: 30.6 pg (ref 26.0–34.0)
MCHC: 33 g/dL (ref 30.0–36.0)
MCV: 92.7 fL (ref 78.0–100.0)
Platelets: 406 10*3/uL — ABNORMAL HIGH (ref 150–400)
RBC: 3.17 MIL/uL — ABNORMAL LOW (ref 3.87–5.11)
RDW: 17.7 % — ABNORMAL HIGH (ref 11.5–15.5)
WBC: 7.9 10*3/uL (ref 4.0–10.5)

## 2013-04-22 LAB — LACTATE DEHYDROGENASE: LDH: 346 U/L — ABNORMAL HIGH (ref 94–250)

## 2013-04-22 MED ORDER — POTASSIUM CHLORIDE CRYS ER 20 MEQ PO TBCR: 20.0000 meq | EXTENDED_RELEASE_TABLET | Freq: Every day | ORAL | Status: DC

## 2013-04-22 MED ORDER — OXYCODONE HCL 10 MG PO TABS: 10.0000 mg | ORAL_TABLET | ORAL | Status: DC | PRN

## 2013-04-22 MED ORDER — TRAMADOL HCL 50 MG PO TABS: ORAL_TABLET | ORAL | Status: DC

## 2013-04-22 MED ORDER — SPIRONOLACTONE 25 MG PO TABS: 25.0000 mg | ORAL_TABLET | Freq: Every day | ORAL | Status: DC

## 2013-04-22 MED ORDER — TORSEMIDE 20 MG PO TABS: 20.0000 mg | ORAL_TABLET | ORAL | Status: DC

## 2013-04-22 MED ORDER — AMLODIPINE BESYLATE 10 MG PO TABS: 10.0000 mg | ORAL_TABLET | Freq: Every day | ORAL | Status: DC

## 2013-04-22 MED ORDER — ISOSORBIDE MONONITRATE ER 30 MG PO TB24
30.0000 mg | ORAL_TABLET | Freq: Every day | ORAL | Status: DC
  Filled 2013-04-22: qty 1

## 2013-04-22 MED ORDER — HYDRALAZINE HCL 25 MG PO TABS: 50.0000 mg | ORAL_TABLET | Freq: Three times a day (TID) | ORAL | Status: DC

## 2013-04-22 MED ORDER — WARFARIN SODIUM 1 MG PO TABS
1.0000 mg | ORAL_TABLET | Freq: Once | ORAL | Status: AC
  Administered 2013-04-22: 1 mg via ORAL
  Filled 2013-04-22: qty 1

## 2013-04-22 MED ORDER — PANTOPRAZOLE SODIUM 40 MG PO TBEC: 40.0000 mg | DELAYED_RELEASE_TABLET | Freq: Every day | ORAL | Status: DC

## 2013-04-22 MED ORDER — AMIODARONE HCL 200 MG PO TABS: 200.0000 mg | ORAL_TABLET | Freq: Every day | ORAL | Status: DC

## 2013-04-22 MED ORDER — CARVEDILOL 12.5 MG PO TABS: 12.5000 mg | ORAL_TABLET | Freq: Two times a day (BID) | ORAL | Status: DC

## 2013-04-22 MED ORDER — LOSARTAN POTASSIUM 100 MG PO TABS: 100.0000 mg | ORAL_TABLET | Freq: Every day | ORAL | Status: DC

## 2013-04-22 MED ORDER — BISACODYL 5 MG PO TBEC: 5.0000 mg | DELAYED_RELEASE_TABLET | Freq: Every day | ORAL | Status: DC | PRN

## 2013-04-22 NOTE — Progress Notes (Signed)
Occupational Therapy Treatment Patient Details Name: Brianna Hanson MRN: [id number] DOB: [birth year] Today's Date: 04/22/2013 Time: 1050-1102 OT Time Calculation (min): 12 min  OT Assessment / Plan / Recommendation  History of present illness 78 year old female with heart failure due severe non-ischemic cardiomyopathy on milrinone and revatio scheduled for Heartmate II LVAD tomorrow for destination therapy   OT comments  Pt has made excellent progress. Pt/family independent with ADL and functional mobility for ADL. Pt/family able to demonstrate ability to switch power systems and manipulate controls in safe and consistent fashion. All acute OT goals met. Further OT to be addressed by HHOT.   Follow Up Recommendations  Home health OT    Barriers to Discharge       Equipment Recommendations  Other (comment);3 in 1 bedside comode    Recommendations for Other Services    Frequency Min 3X/week   Progress towards OT Goals Progress towards OT goals: Progressing toward goals  Plan Discharge plan remains appropriate    Precautions / Restrictions Precautions Precautions: Sternal;Fall Precaution Comments: educated patient on sternotomy incision and precautions to ensure healing Restrictions Weight Bearing Restrictions: Yes (Sternal precautions)   Pertinent Vitals/Pain no apparent distress     ADL  ADL Comments: Discussed any questions regarding ADL and mobility with LVAD system. Pt/caregiver completing care indpendently.     OT Diagnosis:    OT Problem List:   OT Treatment Interventions:     OT Goals(current goals can now be found in the care plan section) Acute Rehab OT Goals Patient Stated Goal: to go home OT Goal Formulation: With patient Time For Goal Achievement: 04/22/13 Potential to Achieve Goals: Good ADL Goals Pt Will Perform Grooming: with modified independence;standing Pt Will Perform Upper Body Bathing: with supervision;with set-up;with caregiver independent in  assisting;sitting Pt Will Perform Lower Body Bathing: with set-up;with supervision;with caregiver independent in assisting;with adaptive equipment;sit to/from stand Pt Will Perform Upper Body Dressing: with supervision;with set-up;with caregiver independent in assisting;sitting Pt Will Transfer to Toilet: with supervision;ambulating;bedside commode Pt Will Perform Toileting - Clothing Manipulation and hygiene: with modified independence;sitting/lateral leans;sit to/from stand Additional ADL Goal #1: independent withsternal precautions during ADL and mobility for ADL  Visit Information  Last OT Received On: 04/22/13 Assistance Needed: +1 History of Present Illness: 78 year old female with heart failure due severe non-ischemic cardiomyopathy on milrinone and revatio scheduled for Heartmate II LVAD tomorrow for destination therapy    Subjective Data      Prior Functioning       Cognition  Cognition Arousal/Alertness: Awake/alert Behavior During Therapy: WFL for tasks assessed/performed Overall Cognitive Status: Within Functional Limits for tasks assessed    Mobility  Bed Mobility Details for Bed Mobility Assistance: completing with Sof caregiver    Exercises      Balance  S   End of Session OT - End of Session Activity Tolerance: Patient tolerated treatment well Patient left: in bed;with call bell/phone within reach;with family/visitor present Nurse Communication: Mobility status  GO     Brianna Hanson,Brianna Hanson 04/22/2013, 11:43 AM Luisa Dago, OTR/L  [phone] 04/22/2013

## 2013-04-22 NOTE — Progress Notes (Signed)
Per MD order, PICC line removed. Cath intact at 37cm. Vaseline pressure gauze to site, pressure held x 5min. No bleeding to site. Pt instructed to keep dressing CDI x 24 hours. Avoid heavy lifting, pushing or pulling x 24 hours,  If bleeding occurs hold pressure, if bleeding does not stop contact MD or go to the ED. Pt does not have any questions. Hassel Uphoff M  

## 2013-04-22 NOTE — Progress Notes (Signed)
[id number] Completed ed on sternal precautions and ex guidelines. Encouraged pt to continue with low sodium diet. Discussed CRP 2 and permission given to refer to [facility] program. Understanding voiced by pt and care giver. Luetta Nutting RNBSN

## 2013-04-22 NOTE — Progress Notes (Signed)
Patient ID: Brianna Hanson, female   DOB: 1934-09-11, 78 y.o.   MRN: [id number] HeartMate 2 Rounding Note  Subjective:    78 y/o female with severe CHF due to NICM, PAH, chronic AF underwent HM II VAD implant 04/06/13  POD #16 CT scan (8/17) for LUQ pain showed a small left rectus sheath hematoma. ASA was stopped. Still with some L side pain improving with tramadol. Denies any orthopnea, SOB or edema.   LVAD INTERROGATION:  HeartMate II LVAD:  Flow 4.3 liters/min, speed 8980, power 6.8, PI 4.8  No PI events   Objective:    Vital Signs:   Temp:  [98.1 F (36.7 C)-98.4 F (36.9 C)] 98.1 F (36.7 C) (08/21 0500) Pulse Rate:  [60] 60 (08/21 0831) Resp:  [20] 20 (08/21 0500) SpO2:  [95 %-98 %] 98 % (08/21 0500) Weight:  [68.4 kg (150 lb 12.7 oz)] 68.4 kg (150 lb 12.7 oz) (08/21 0500) Last BM Date: 04/20/13 Mean arterial Pressure 90-100s  Intake/Output:   Intake/Output Summary (Last 24 hours) at 04/22/13 1835 Last data filed at 04/22/13 1100  Gross per 24 hour  Intake    240 ml  Output    200 ml  Net     40 ml     Physical Exam: General:  sitting in chair HEENT: Normal Neck: supple. JVP 6-7 . Carotids 2+ bilat; no bruits. No lymphadenopathy or thryomegaly appreciated. Cor: Mechanical heart sounds with LVAD hum present. Dressing c/d/i Lungs: clear Abdomen: soft, nondistended. + BS. Mildly tender in LUQ Driveline: C/D/I; securement device intact;  Extremities: no cyanosis, clubbing, rash, no edema  Neuro: awake alert. Pleasant. nonfocal  Telemetry: sinus 80s  Labs: Basic Metabolic Panel:  Recent Labs Lab 04/18/13 1040 04/19/13 0502 04/20/13 0550 04/21/13 0535 04/22/13 0520  NA 134* 134* 136 137 136  K 4.0 3.9 3.9 3.5 4.2  CL 100 99 104 102 105  CO2 26 24 23 23 23   GLUCOSE 95 88 77 87 78  BUN 8 8 8 10 8   CREATININE 0.79 0.91 0.88 0.97 0.73  CALCIUM 9.1 9.4 9.0 9.2 8.8    Liver Function Tests:  Recent Labs Lab 04/19/13 0502  AST 26  ALT 16  ALKPHOS 89   BILITOT 0.7  PROT 7.7  ALBUMIN 2.9*    Recent Labs Lab 04/19/13 0502  AMYLASE 92   No results found for this basename: AMMONIA,  in the last 168 hours  CBC:  Recent Labs Lab 04/18/13 1040 04/19/13 0502 04/20/13 0550 04/21/13 0535 04/22/13 0520  WBC 10.9* 11.5* 9.5 8.9 7.9  HGB 9.6* 9.9* 9.5* 9.8* 9.7*  HCT 28.7* 30.7* 29.2* 29.0* 29.4*  MCV 92.9 93.6 93.9 92.7 92.7  PLT 375 406* 400 407* 406*    INR: 1.39  Recent Labs Lab 04/18/13 1040 04/19/13 0502 04/20/13 0550 04/21/13 0535 04/22/13 0520  INR 2.28* 2.28* 2.23* 2.49* 2.76*   Imaging: No results found.  Medications:     Scheduled Medications:   Infusions:   PRN Medications:      Assessment:   1. Acute on chronic systolic HF due to NICM     --s/p HM II VAD on 04/06/13 2. PAH 3. Acute and chronic AF  - now in NSR 4. VT 5. Hypokalemia/hypomagnesemia 6. Epistaxis - resolved 7. Ab pain d/t rectus sheath hematoma 8. UTI  Plan/Discussion:    POD # 16 Doing great and ready to go home. MAPs still elevated 90-100s will increase BB to 12.5 mg BID and  hyrdralazine to 50 mg TID.   Weight down 1 lb, stable. INR therapeutic 2.7 will continue current dose and check on Monday with HHRN.  Continue ultram for pain.  Education complete and patient and family feel ready to go home. They have practiced paging the VAD pager and will follow up closely in the HF clinic.  I reviewed the LVAD parameters from today, and compared the results to the patient's prior recorded data.  No programming changes were made.  The LVAD is functioning within specified parameters.  The patient performs LVAD self-test daily.  LVAD interrogation was negative for any significant power changes, alarms or PI events/speed drops.  LVAD equipment check completed and is in good working order.  Back-up equipment present.   LVAD education done on emergency procedures and precautions and reviewed exit site care.  Length of Stay: 17  Holly Bodily 04/22/2013, 6:35 PM  Advanced Heart Failure Team Pager [phone] (M-F; 7a - 4p)  Please contact [facility] for night-coverage after hours (4p -7a ) and weekends on [email or web address]  VAD Team Pager [phone] (7am - 7am)  For VAD issues only

## 2013-04-22 NOTE — Progress Notes (Signed)
Discharge VAD teaching completed with patient and caregiver.    Discharge equipment includes:  ____ Two system controllers:  Primary:  [id number]: fixed speed 9000 RPM and low speed limit 8400 RPM  Back up: [id number]:  fixed speed 9000 RPM and low speed limit 8400 RPM ____ One power module (PM) with 20' patient cable ____ One universal Magazine features editor (UBC) ____ Eight fully charged batteries ____ Four battery clips ____ One travel case ____ One holster vest ____    One consolidated bag ____    One automobile DC power adapter  Following notification process completed with:  [employer]     Based on information received from [employer] manufacturer ([employer]) about reported difficulties that have occurred with some patients and caregivers when changing from primary to back up pocket controller, the HM II LVAD addendum document [phone] was given and reviewed with pt and caregiver (husband).  Additional instructions were provided as follows:        1. Patient should have a caregiver present during system controller exchange or call     911 and VAD pager before attempting to change controller if alone. 2. As part of the weekly safety checklist, the patient and caregiver should review and      understand the steps and instructions involved with replacing the system controller.  3. As part of monthly safety checklist, the patient and caregiver should review and                     understand the system controller alarms and how to resolve them.  4. Updated copy of HM II Guide to Replacing the Running System Controller with the              Backup Controller for Patients and Their Caregiveriven reviewed and given to the          patient.  5. HM II Home Flowsheets given that include the weekly and monthly checks as      noted above.  Discussed frequency and importance of INR checks; emphasized importance of maintaining INR goal to prevent clotting and or bleeding issues with  pump.  Pt will receive today's coumadin dose prior to discharge; she will not need to take coumadin this evening; then will take coumadin 2.5 mg daily until VAD clinic appt next Tuesday. Patient will have INR managed by AHF team; current INR goal is 2-3 (~ 2.0 per MD due to small rectus sheath hematoma).   The patient has completed a proficiency test for the HM II and all questions have been answered. The pt and family have been instructed to call if any questions, problems, or concerns arise. Pt and caregiver successfully paged VAD coordinator using VAD pager emergency number and have been instructed to use this number only for emergencies. Patient and caregiver(s) asked appropriate questions, had good interaction with VAD coordinator, and verbalized understanding of above instructions.

## 2013-04-22 NOTE — Discharge Summary (Signed)
Advanced Heart Failure Team  Discharge Summary   Patient ID: Brianna Hanson MRN: [id number], DOB/AGE: Jan 19, 1935 78 y.o. Admit date: 04/05/2013 D/C date:     04/22/2013   Primary Discharge Diagnoses:  1) A/C systolic HF d/t NICM - s/p HM II LVAD on 04/06/13  Secondary Discharge Diagnoses:  1) PAH 2) Acute and chronic AF- now in SR 3) VT 4) Hypokalemia/hypomagnesemia 5) Epistaxis- resolved 6) Abdominal pain d/t rectus sheath hematoma 7) UTI- UA + Citrobacter Koseri  Hospital Course:  Ms Brianna Hanson is a 78 year old female with a PMH of HF due to severe NICM (EF 15%) diagnosed in 1999 on chronic Milrinone at 0.375 mcg, cardiac arrest 2010 -V fib S/P ICD, PAF (maintaing SR on amio) - on chronic coumadin and plasma cell disorder (Likely IgA MGUS) - followed by DR. Shadad.   She has been followed closely in the HF clinic and on 04/05/13 she was admitted for medical optimization of her HF before implantation of LVAD as Destination Therapy. She went to the OR on 04/06/13 and underwent implantation of the HeartMate II LVAD. She tolerated surgery well and was transferred to the ICU. During her stay her pressors and milrinone were weaned off, which her MAPs and co-ox remained stable. During her stay in the ICU she experienced wide complex tachycardia 160's which appeared to be Vtach and she was started on IV amiodarone, which was converted to PO and weaned down to 200 mg daily, which she tolerated and has remained in SR. She was started on Coumadin and during her stay experienced epistaxis, which ENT was consulted and it was cauterized with silver nitrate. She had an ECHO on 8/9 which showed AoV not opening, septum midline, and the RV with mild to moderate dysfunction. She also experienced lots of pocket pain and she was taken for a CT scan showing a small rectus sheath hematoma. Her pain continued to improve. Her aspirin was held.  On discharge she was able to walk ~500 ft with no SOB, orthopnea or CP. Her LVAD  parameters were stable and she was not experiencing any PI events. Her ICD was turned back on after the surgery and her VT thresholds were set at a higher rate. The VAD Coordinator provided extensive education multiple times with the patient and her significant other and both were able to pass the Education Verification Competency Exam. Her weight on discharge was 150 lbs.    Discharge Vitals: Blood pressure 121/80, pulse 60, temperature 98.1 F (36.7 C), temperature source Oral, resp. rate 20, height 4' 11.84" (1.52 m), weight 68.4 kg (150 lb 12.7 oz), SpO2 98.00%.  Labs: Lab Results  Component Value Date   WBC 7.9 04/22/2013   HGB 9.7* 04/22/2013   HCT 29.4* 04/22/2013   MCV 92.7 04/22/2013   PLT 406* 04/22/2013    Recent Labs Lab 04/19/13 0502  04/22/13 0520  NA 134*  < > 136  K 3.9  < > 4.2  CL 99  < > 105  CO2 24  < > 23  BUN 8  < > 8  CREATININE 0.91  < > 0.73  CALCIUM 9.4  < > 8.8  PROT 7.7  --   --   BILITOT 0.7  --   --   ALKPHOS 89  --   --   ALT 16  --   --   AST 26  --   --   GLUCOSE 88  < > 78  < > = values  in this interval not displayed. Lab Results  Component Value Date   CHOL  Value: 130        ATP III CLASSIFICATION:  <200     mg/dL   Desirable  161-096  mg/dL   Borderline High  >=045    mg/dL   High        4/0/9811   HDL 36* 05/05/2010   LDLCALC  Value: 76        Total Cholesterol/HDL:CHD Risk Coronary Heart Disease Risk Table                     Men   Women  1/2 Average Risk   3.4   3.3  Average Risk       5.0   4.4  2 X Average Risk   9.6   7.1  3 X Average Risk  23.4   11.0        Use the calculated Patient Ratio above and the CHD Risk Table to determine the patient's CHD Risk.        ATP III CLASSIFICATION (LDL):  <100     mg/dL   Optimal  914-782  mg/dL   Near or Above                    Optimal  130-159  mg/dL   Borderline  956-213  mg/dL   High  >086     mg/dL   Very High 01/07/8468   TRIG 90 05/05/2010   BNP (last 3 results)  Recent Labs  04/05/13 1220  04/07/13 0500 04/13/13 0020  PROBNP 3022.0* 3243.0* 4254.0*    Diagnostic Studies/Procedures   No results found.  Discharge Medications     Medication List    STOP taking these medications       furosemide 80 MG tablet  Commonly known as:  LASIX     milrinone 20 MG/100ML Soln infusion  Commonly known as:  PRIMACOR     sildenafil 20 MG tablet  Commonly known as:  REVATIO      TAKE these medications       amLODipine 10 MG tablet  Commonly known as:  NORVASC  Take 1 tablet (10 mg total) by mouth daily.     bisacodyl 5 MG EC tablet  Commonly known as:  bisacodyl  Take 1 tablet (5 mg total) by mouth daily as needed for constipation.     hydrALAZINE 25 MG tablet  Commonly known as:  APRESOLINE  Take 2 tablets (50 mg total) by mouth every 8 (eight) hours.     losartan 100 MG tablet  Commonly known as:  COZAAR  Take 1 tablet (100 mg total) by mouth daily.     Oxycodone HCl 10 MG Tabs  Take 1 tablet (10 mg total) by mouth every 4 (four) hours as needed (For severe pain).     pantoprazole 40 MG tablet  Commonly known as:  PROTONIX  Take 1 tablet (40 mg total) by mouth daily.     potassium chloride SA 20 MEQ tablet  Commonly known as:  K-DUR,KLOR-CON  Take 1 tablet (20 mEq total) by mouth daily.     spironolactone 25 MG tablet  Commonly known as:  ALDACTONE  Take 1 tablet (25 mg total) by mouth daily.     torsemide 20 MG tablet  Commonly known as:  DEMADEX  Take 1 tablet (20 mg total) by mouth every other day.  Start taking on:  04/24/2013     traMADol 50 MG tablet  Commonly known as:  ULTRAM  Take 1-2 tablets q 6 hrs for pain.     Vitamin D3 1000 UNITS Caps  Take 1 tablet by mouth daily.     warfarin 2.5 MG tablet  Commonly known as:  COUMADIN  Take 2.5 mg by mouth daily.      ASK your doctor about these medications       amiodarone 200 MG tablet  Commonly known as:  PACERONE  TAKE 1 TABLET BY MOUTH EVERY DAY  Ask about: Which instructions should  I use?     amiodarone 200 MG tablet  Commonly known as:  PACERONE  Take 1 tablet (200 mg total) by mouth daily.  Ask about: Which instructions should I use?     carvedilol 12.5 MG tablet  Commonly known as:  COREG  Take 1 tablet (12.5 mg total) by mouth 2 (two) times daily with a meal.  Ask about: Which instructions should I use?        Disposition   The patient will be discharged in stable condition to home. Discharge Orders   Future Appointments Provider Department Dept Phone   04/27/2013 10:00 AM [facility] [phone]   08/12/2013 9:00 AM Dava Najjar [facility] [phone]   08/12/2013 9:30 AM Benjiman Core, MD [place] [facility] [phone]   Future Orders Complete By Expires   ACE Inhibitor / ARB already ordered  As directed    Amb Referral to Cardiac Rehabilitation  As directed    Diet - low sodium heart healthy  As directed    Heart Failure patients record your daily weight using the same scale at the same time of day  As directed    Increase activity slowly  As directed    PICC line removal  As directed    STOP any activity that causes chest pain, shortness of breath, dizziness, sweating, or exessive weakness  As directed      Follow-up Information   Follow up with Arvilla Meres, MD On 04/27/2013. (@ 10:00 am; [facility] code 0002)    Specialty:  Cardiology   Contact information:   [address] [place] [address] [phone]         Duration of Discharge Encounter: Greater than 35 minutes   Consider restarting ASA at first outpatient visit.  Migdalia Dk  04/22/2013, 6:34 PM

## 2013-04-23 DIAGNOSIS — Z452 Encounter for adjustment and management of vascular access device: Secondary | ICD-10-CM | POA: Diagnosis not present

## 2013-04-23 DIAGNOSIS — I509 Heart failure, unspecified: Secondary | ICD-10-CM | POA: Diagnosis not present

## 2013-04-23 DIAGNOSIS — I10 Essential (primary) hypertension: Secondary | ICD-10-CM | POA: Diagnosis not present

## 2013-04-23 DIAGNOSIS — I4891 Unspecified atrial fibrillation: Secondary | ICD-10-CM | POA: Diagnosis not present

## 2013-04-23 DIAGNOSIS — I5023 Acute on chronic systolic (congestive) heart failure: Secondary | ICD-10-CM | POA: Diagnosis not present

## 2013-04-23 DIAGNOSIS — Z7901 Long term (current) use of anticoagulants: Secondary | ICD-10-CM | POA: Diagnosis not present

## 2013-04-23 NOTE — Progress Notes (Signed)
Pt/family given discharge instructions, medication lists, follow up appointments, and when to call the doctor.  Pt/family verbalizes understanding. Pt given signs and symptoms of infection. Pt understands importance of LVAD equipment, safety checks, follow  Up appts, and contacting MD?LVAD coordinator for any issues.  All questions answered. Brianna Hanson

## 2013-04-23 NOTE — Progress Notes (Signed)
Patient d/c'd home today.  She stated that she is feeling well and looking forward to getting home. She denied any CSW needs.  CSW signing off.  Lorri Frederick. [place]  [phone]

## 2013-04-24 LAB — CULTURE, BLOOD (ROUTINE X 2)
Culture: NO GROWTH
Culture: NO GROWTH

## 2013-04-26 ENCOUNTER — Encounter: Payer: Self-pay | Admitting: Internal Medicine

## 2013-04-26 ENCOUNTER — Ambulatory Visit: Payer: Self-pay | Admitting: *Deleted

## 2013-04-26 DIAGNOSIS — I4891 Unspecified atrial fibrillation: Secondary | ICD-10-CM | POA: Diagnosis not present

## 2013-04-26 DIAGNOSIS — Z452 Encounter for adjustment and management of vascular access device: Secondary | ICD-10-CM | POA: Diagnosis not present

## 2013-04-26 DIAGNOSIS — I10 Essential (primary) hypertension: Secondary | ICD-10-CM | POA: Diagnosis not present

## 2013-04-26 DIAGNOSIS — Z7901 Long term (current) use of anticoagulants: Secondary | ICD-10-CM | POA: Diagnosis not present

## 2013-04-26 DIAGNOSIS — I5023 Acute on chronic systolic (congestive) heart failure: Secondary | ICD-10-CM | POA: Diagnosis not present

## 2013-04-26 DIAGNOSIS — I509 Heart failure, unspecified: Secondary | ICD-10-CM | POA: Diagnosis not present

## 2013-04-26 LAB — PROTIME-INR: INR: 2.1 — AB (ref 0.9–1.1)

## 2013-04-27 ENCOUNTER — Telehealth (HOSPITAL_COMMUNITY): Payer: Self-pay | Admitting: *Deleted

## 2013-04-27 ENCOUNTER — Ambulatory Visit (HOSPITAL_COMMUNITY)
Admission: RE | Admit: 2013-04-27 | Discharge: 2013-04-27 | Disposition: A | Discharge status: Home / Self Care | Payer: Medicare Other | Source: Ambulatory Visit | Attending: Internal Medicine | Admitting: Internal Medicine

## 2013-04-27 ENCOUNTER — Ambulatory Visit (HOSPITAL_COMMUNITY)
Admission: RE | Admit: 2013-04-27 | Discharge: 2013-04-27 | Disposition: A | Discharge status: Home / Self Care | Payer: Medicare Other | Source: Ambulatory Visit | Attending: Anesthesiology | Admitting: Anesthesiology

## 2013-04-27 ENCOUNTER — Other Ambulatory Visit: Payer: Self-pay | Admitting: *Deleted

## 2013-04-27 VITALS — BP 80/0

## 2013-04-27 DIAGNOSIS — Z7901 Long term (current) use of anticoagulants: Secondary | ICD-10-CM | POA: Diagnosis not present

## 2013-04-27 DIAGNOSIS — R5381 Other malaise: Secondary | ICD-10-CM | POA: Diagnosis not present

## 2013-04-27 DIAGNOSIS — I4891 Unspecified atrial fibrillation: Secondary | ICD-10-CM | POA: Insufficient documentation

## 2013-04-27 DIAGNOSIS — Z95811 Presence of heart assist device: Secondary | ICD-10-CM

## 2013-04-27 DIAGNOSIS — I429 Cardiomyopathy, unspecified: Secondary | ICD-10-CM

## 2013-04-27 DIAGNOSIS — J90 Pleural effusion, not elsewhere classified: Secondary | ICD-10-CM | POA: Diagnosis not present

## 2013-04-27 DIAGNOSIS — Z95818 Presence of other cardiac implants and grafts: Secondary | ICD-10-CM

## 2013-04-27 DIAGNOSIS — I428 Other cardiomyopathies: Secondary | ICD-10-CM | POA: Diagnosis not present

## 2013-04-27 DIAGNOSIS — I5022 Chronic systolic (congestive) heart failure: Secondary | ICD-10-CM | POA: Insufficient documentation

## 2013-04-27 DIAGNOSIS — Z95810 Presence of automatic (implantable) cardiac defibrillator: Secondary | ICD-10-CM | POA: Diagnosis not present

## 2013-04-27 LAB — BASIC METABOLIC PANEL
BUN: 17 mg/dL (ref 6–23)
CO2: 23 mEq/L (ref 19–32)
Calcium: 9.7 mg/dL (ref 8.4–10.5)
Chloride: 102 mEq/L (ref 96–112)
Creatinine, Ser: 1.22 mg/dL — ABNORMAL HIGH (ref 0.50–1.10)
GFR calc Af Amer: 49 mL/min — ABNORMAL LOW (ref >90)
GFR calc non Af Amer: 42 mL/min — ABNORMAL LOW (ref >90)
Glucose, Bld: 128 mg/dL — ABNORMAL HIGH (ref 70–99)
Potassium: 4 mEq/L (ref 3.5–5.1)
Sodium: 137 mEq/L (ref 135–145)

## 2013-04-27 LAB — CBC
HCT: 33.2 % — ABNORMAL LOW (ref 36.0–46.0)
Hemoglobin: 10.6 g/dL — ABNORMAL LOW (ref 12.0–15.0)
MCH: 30 pg (ref 26.0–34.0)
MCHC: 31.9 g/dL (ref 30.0–36.0)
MCV: 94.1 fL (ref 78.0–100.0)
Platelets: 351 10*3/uL (ref 150–400)
RBC: 3.53 MIL/uL — ABNORMAL LOW (ref 3.87–5.11)
RDW: 17 % — ABNORMAL HIGH (ref 11.5–15.5)
WBC: 7.5 10*3/uL (ref 4.0–10.5)

## 2013-04-27 LAB — LACTATE DEHYDROGENASE: LDH: 334 U/L — ABNORMAL HIGH (ref 94–250)

## 2013-04-27 MED ORDER — CARVEDILOL 12.5 MG PO TABS: 6.2500 mg | ORAL_TABLET | Freq: Two times a day (BID) | ORAL | Status: DC

## 2013-04-27 MED ORDER — TORSEMIDE 20 MG PO TABS: ORAL_TABLET | ORAL | Status: DC

## 2013-04-27 MED ORDER — POTASSIUM CHLORIDE CRYS ER 20 MEQ PO TBCR: EXTENDED_RELEASE_TABLET | ORAL | Status: DC

## 2013-04-27 MED ORDER — GABAPENTIN 100 MG PO CAPS: 100.0000 mg | ORAL_CAPSULE | ORAL | Status: DC

## 2013-04-27 NOTE — Patient Instructions (Addendum)
1.  Decrease Coreg to 6.125 twice daily 2.  Stop every other day doses of Demadex; take Demadex 20 mg only if weight > 155 lbs 2.  Start Neurontin 100 mg each evening for pain 3.  Try Tylenol 500 mg two tabs as needed for pain 4.   Planned activity each morning and afternoon 5.   Return to VAD clinic in one week 6.   Chest x-ray today before you leave

## 2013-04-27 NOTE — Telephone Encounter (Signed)
Called pt and husband per Dr. Gala Romney; instructed to stop taking K-Dur 20 meq daily. Pt will only need to take with her prn demadex dose. Pt verbalized understanding that pt will take demadex 20 mg and K-Dur 20 meq only when weight is > 155 lbs.

## 2013-04-27 NOTE — Patient Instructions (Addendum)
Continue same dose

## 2013-04-28 DIAGNOSIS — Z452 Encounter for adjustment and management of vascular access device: Secondary | ICD-10-CM | POA: Diagnosis not present

## 2013-04-28 DIAGNOSIS — I10 Essential (primary) hypertension: Secondary | ICD-10-CM | POA: Diagnosis not present

## 2013-04-28 DIAGNOSIS — I5023 Acute on chronic systolic (congestive) heart failure: Secondary | ICD-10-CM | POA: Diagnosis not present

## 2013-04-28 DIAGNOSIS — Z7901 Long term (current) use of anticoagulants: Secondary | ICD-10-CM | POA: Diagnosis not present

## 2013-04-28 DIAGNOSIS — I4891 Unspecified atrial fibrillation: Secondary | ICD-10-CM | POA: Diagnosis not present

## 2013-04-28 DIAGNOSIS — I509 Heart failure, unspecified: Secondary | ICD-10-CM | POA: Diagnosis not present

## 2013-04-29 ENCOUNTER — Other Ambulatory Visit: Payer: Self-pay | Admitting: *Deleted

## 2013-04-29 DIAGNOSIS — Z7901 Long term (current) use of anticoagulants: Secondary | ICD-10-CM

## 2013-04-29 DIAGNOSIS — Z95811 Presence of heart assist device: Secondary | ICD-10-CM

## 2013-04-30 ENCOUNTER — Telehealth (HOSPITAL_COMMUNITY): Payer: Self-pay | Admitting: Cardiology

## 2013-04-30 ENCOUNTER — Ambulatory Visit: Payer: Self-pay | Admitting: *Deleted

## 2013-04-30 DIAGNOSIS — Z452 Encounter for adjustment and management of vascular access device: Secondary | ICD-10-CM | POA: Diagnosis not present

## 2013-04-30 DIAGNOSIS — Z7901 Long term (current) use of anticoagulants: Secondary | ICD-10-CM | POA: Diagnosis not present

## 2013-04-30 DIAGNOSIS — Z95818 Presence of other cardiac implants and grafts: Secondary | ICD-10-CM

## 2013-04-30 DIAGNOSIS — I5023 Acute on chronic systolic (congestive) heart failure: Secondary | ICD-10-CM | POA: Diagnosis not present

## 2013-04-30 DIAGNOSIS — I509 Heart failure, unspecified: Secondary | ICD-10-CM | POA: Diagnosis not present

## 2013-04-30 DIAGNOSIS — I10 Essential (primary) hypertension: Secondary | ICD-10-CM | POA: Diagnosis not present

## 2013-04-30 DIAGNOSIS — I4891 Unspecified atrial fibrillation: Secondary | ICD-10-CM | POA: Diagnosis not present

## 2013-04-30 LAB — PROTIME-INR: INR: 2.3 — AB (ref 0.9–1.1)

## 2013-04-30 NOTE — Telephone Encounter (Signed)
Order placed for echo.

## 2013-04-30 NOTE — Patient Instructions (Signed)
No change in coumadin dose

## 2013-05-01 DIAGNOSIS — I5022 Chronic systolic (congestive) heart failure: Secondary | ICD-10-CM | POA: Insufficient documentation

## 2013-05-01 NOTE — Progress Notes (Signed)
HPI:  Brianna Hanson is a 78 year old female with a PMH of HF due to severe NICM (EF 15% S/P ICD, PAF (maintaing SR on amio), plasma cell disorder (Likely IgA MGUS) - followed by DR. Shadad. Underwent implantation of the HeartMate II LVAD on 04/06/13.  Here for post d/c evaluation.  Follow up:   Doing very well. Still weak but becoming more active. Pock pain improving but still bothers her at times. Denies any ab pain. No fevers, chills, edema, dizziness.   Denies LVAD alarms.  Denies driveline trauma, erythema or drainage.  Denies ICD shocks.   Reports taking Coumadin as prescribed, with exception of missed dose Sunday evening, and adherence to anticoagulation based dietary restrictions.  Denies bright red blood per rectum or melena, no dark urine or hematuria.    Symptom  Yes  No  Details   Angina        x Activity:   Claudication        x How far:   Syncope        x When:   Stroke        x   Orthopnea        x How many pillows: 3 (comfort)  PND        x How often   CPAP        x How many hrs   Pedal edema        x   Abd fullness        x   N&V             x   Fair appetite; 1/2 meal  Diaphoresis        x When:  Bleeding       x   Urine                                   Light yellow urine  SOB        x Activity:  Palpitations         x       When: when first wake up every morning  ICD shock        x   Hospitlizaitons         x   When/where/why:  8/4 - 04/22/13 LVAD implant  ED visit        x When/where/why:  Other MD        x When/who/why:  Activity    PT consult initiated; pt sleeing 20 hrs/day  Fluid    No limitations (2 liters/day)  Diet    No added salt    Past Medical History  Diagnosis Date  . Cardiac arrest - ventricular fibrillation 12/10    with successful resucitation, S/p ICD  . ICD (implantable cardiac defibrillator) in place     she has received appropriate therapy for VF  . Atrial fibrillation or flutter   . Osteopenia   . HTN (hypertension)     moderate  .  Nonischemic cardiomyopathy     followed by Dr Glori Luis at [facility]  . CHF (congestive heart failure)   . Anemia   . S/P colonoscopy   . Plasma cell disorder 03/20/2012  . Plasma cell disorder 03/20/2012    Current Outpatient Prescriptions  Medication Sig Dispense Refill  . amiodarone (PACERONE) 200 MG tablet TAKE 1 TABLET BY MOUTH EVERY DAY  30 tablet  6  .  amLODipine (NORVASC) 10 MG tablet Take 1 tablet (10 mg total) by mouth daily.  30 tablet  6  . bisacodyl (BISACODYL) 5 MG EC tablet Take 1 tablet (5 mg total) by mouth daily as needed for constipation.  30 tablet  0  . carvedilol (COREG) 12.5 MG tablet Take 0.5 tablets (6.25 mg total) by mouth 2 (two) times daily with a meal.  60 tablet  6  . Cholecalciferol (VITAMIN D3) 1000 UNITS CAPS Take 1 tablet by mouth daily.        . hydrALAZINE (APRESOLINE) 25 MG tablet Take 2 tablets (50 mg total) by mouth every 8 (eight) hours.  90 tablet  6  . losartan (COZAAR) 100 MG tablet Take 1 tablet (100 mg total) by mouth daily.  30 tablet  6  . Oxycodone HCl 10 MG TABS Take 1 tablet (10 mg total) by mouth every 4 (four) hours as needed (For severe pain).  30 tablet  0  . pantoprazole (PROTONIX) 40 MG tablet Take 1 tablet (40 mg total) by mouth daily.  30 tablet  6  . spironolactone (ALDACTONE) 25 MG tablet Take 1 tablet (25 mg total) by mouth daily.  30 tablet  6  . torsemide (DEMADEX) 20 MG tablet Take demadex 20 mg if weight above 155 lbs  15 tablet  6  . traMADol (ULTRAM) 50 MG tablet Take 1-2 tablets q 6 hrs for pain.  60 tablet  3  . warfarin (COUMADIN) 2.5 MG tablet Take 2.5 mg by mouth daily.      [name] gabapentin (NEURONTIN) 100 MG capsule Take 1 capsule (100 mg total) by mouth 1 day or 1 dose. Take one tablet q hs or as directed  30 capsule  2  . potassium chloride SA (K-DUR,KLOR-CON) 20 MEQ tablet Take one tab prn (when pt takes demadex 20 mg for weight > 155 lbs)  30 tablet  6   No current facility-administered medications for this encounter.     Review of patient's allergies indicates no known allergies.  REVIEW OF SYSTEMS: All systems negative except as listed in HPI, PMH and Problem list.   LVAD INTERROGATION:   HeartMate II LVAD:  Flow 3.8 liters/min, speed 8980, power 4.6, PI 5.9.  Controller Serial # [id number]. Few PI events  I reviewed the LVAD parameters from today, and compared the results to the patient's prior recorded data.  No programming changes were made.  The LVAD is functioning within specified parameters.  The patient performs LVAD self-test daily.  LVAD interrogation was negative for any significant power changes, alarms or PI events/speed drops.  LVAD equipment check completed and is in good working order.  Back-up equipment present.   LVAD education done on emergency procedures and precautions and reviewed exit site care.   Physical Exam: Filed Vitals:   04/27/13 1020  BP: 80/0   LVAD parameters: Flow:  3.8 Speed:  8980 PI:  5.9 Power:  4.6 Alarms:  None Events:  Few PI events Fixed speed:  9000 Low speed limit:  8400   GENERAL: Well appearing, female who presents to clinic today in no acute distress. HEENT: normal  NECK: Supple, JVP 6  .  2+ bilaterally, no bruits.  No lymphadenopathy or thyromegaly appreciated.   CARDIAC:  Mechanical heart sounds with LVAD hum present.  LUNGS:  Clear to auscultation bilaterally.  ABDOMEN:  Soft, round, nontender, positive bowel sounds x4.     LVAD exit site: one suture intact.   Dressing dry and  intact.  No erythema or drainage.  Stabilization device present and accurately applied.  Driveline dressing is being changed daily per sterile technique. EXTREMITIES:  Warm and dry, no cyanosis, clubbing, rash or edema  NEUROLOGIC:  Alert and oriented x 4.  Gait steady.  No aphasia.  No dysarthria.  Affect pleasant.      ASSESSMENT AND PLAN:   1. Chronic systolic HF status post LVAD implantation 04/06/13 - Doing very well from a functional standpoint and also handling  her VAD. Continue PT.      Volume status and BP on low side. .  Decrease Coreg to 6.125 twice daily. Stop every other day doses of Demadex; take Demadex 20 mg only if weight > 155 lbs 2.  Anticoagulation management - INR goal 1.8 - 2.3.  INR on 04/26/13 was 2.1; pt will continue coumadin 2.5 mg daily. 3.  CXR today shows small pleural effusions unchanged.  4.  LVAD pocket pain - appears neuropathic. Will try neurontin 100 mg daily. Can also use XS tylenol 1g every 8 hours as needed.  Arda Daggs,MD 2:57 PM

## 2013-05-01 NOTE — Addendum Note (Signed)
Encounter addended by: Dolores Patty, MD on: 05/01/2013  2:59 PM<BR>     Documentation filed: Charges VN

## 2013-05-03 DIAGNOSIS — I5023 Acute on chronic systolic (congestive) heart failure: Secondary | ICD-10-CM | POA: Diagnosis not present

## 2013-05-03 DIAGNOSIS — I4891 Unspecified atrial fibrillation: Secondary | ICD-10-CM | POA: Diagnosis not present

## 2013-05-03 DIAGNOSIS — I509 Heart failure, unspecified: Secondary | ICD-10-CM | POA: Diagnosis not present

## 2013-05-03 DIAGNOSIS — Z452 Encounter for adjustment and management of vascular access device: Secondary | ICD-10-CM | POA: Diagnosis not present

## 2013-05-03 DIAGNOSIS — I10 Essential (primary) hypertension: Secondary | ICD-10-CM

## 2013-05-03 DIAGNOSIS — Z79899 Other long term (current) drug therapy: Secondary | ICD-10-CM | POA: Diagnosis not present

## 2013-05-03 DIAGNOSIS — Z7901 Long term (current) use of anticoagulants: Secondary | ICD-10-CM | POA: Diagnosis not present

## 2013-05-04 ENCOUNTER — Ambulatory Visit: Payer: Self-pay | Admitting: *Deleted

## 2013-05-04 ENCOUNTER — Ambulatory Visit (HOSPITAL_BASED_OUTPATIENT_CLINIC_OR_DEPARTMENT_OTHER)
Admission: RE | Admit: 2013-05-04 | Discharge: 2013-05-04 | Disposition: A | Discharge status: Home / Self Care | Payer: Medicare Other | Source: Ambulatory Visit | Attending: Internal Medicine | Admitting: Internal Medicine

## 2013-05-04 ENCOUNTER — Ambulatory Visit (HOSPITAL_COMMUNITY)
Admission: RE | Admit: 2013-05-04 | Discharge: 2013-05-04 | Disposition: A | Discharge status: Home / Self Care | Payer: Medicare Other | Source: Ambulatory Visit | Attending: Internal Medicine | Admitting: Internal Medicine

## 2013-05-04 ENCOUNTER — Encounter: Payer: Self-pay | Admitting: *Deleted

## 2013-05-04 VITALS — BP 100/0 | HR 70 | Wt 159.0 lb

## 2013-05-04 DIAGNOSIS — I10 Essential (primary) hypertension: Secondary | ICD-10-CM

## 2013-05-04 DIAGNOSIS — Z09 Encounter for follow-up examination after completed treatment for conditions other than malignant neoplasm: Secondary | ICD-10-CM | POA: Insufficient documentation

## 2013-05-04 DIAGNOSIS — I4891 Unspecified atrial fibrillation: Secondary | ICD-10-CM | POA: Diagnosis not present

## 2013-05-04 DIAGNOSIS — I428 Other cardiomyopathies: Secondary | ICD-10-CM | POA: Diagnosis not present

## 2013-05-04 DIAGNOSIS — Z95811 Presence of heart assist device: Secondary | ICD-10-CM

## 2013-05-04 DIAGNOSIS — Z95818 Presence of other cardiac implants and grafts: Secondary | ICD-10-CM

## 2013-05-04 DIAGNOSIS — I5022 Chronic systolic (congestive) heart failure: Secondary | ICD-10-CM

## 2013-05-04 DIAGNOSIS — Z7901 Long term (current) use of anticoagulants: Secondary | ICD-10-CM | POA: Diagnosis not present

## 2013-05-04 DIAGNOSIS — Z87891 Personal history of nicotine dependence: Secondary | ICD-10-CM | POA: Insufficient documentation

## 2013-05-04 DIAGNOSIS — I517 Cardiomegaly: Secondary | ICD-10-CM | POA: Diagnosis not present

## 2013-05-04 DIAGNOSIS — R0602 Shortness of breath: Secondary | ICD-10-CM | POA: Insufficient documentation

## 2013-05-04 LAB — BASIC METABOLIC PANEL
BUN: 9 mg/dL (ref 6–23)
CO2: 24 mEq/L (ref 19–32)
Calcium: 9.2 mg/dL (ref 8.4–10.5)
Chloride: 102 mEq/L (ref 96–112)
Creatinine, Ser: 0.87 mg/dL (ref 0.50–1.10)
GFR calc Af Amer: 73 mL/min — ABNORMAL LOW (ref >90)
GFR calc non Af Amer: 63 mL/min — ABNORMAL LOW (ref >90)
Glucose, Bld: 79 mg/dL (ref 70–99)
Potassium: 4 mEq/L (ref 3.5–5.1)
Sodium: 134 mEq/L — ABNORMAL LOW (ref 135–145)

## 2013-05-04 LAB — CBC
HCT: 34 % — ABNORMAL LOW (ref 36.0–46.0)
Hemoglobin: 11 g/dL — ABNORMAL LOW (ref 12.0–15.0)
MCH: 30.3 pg (ref 26.0–34.0)
MCHC: 32.4 g/dL (ref 30.0–36.0)
MCV: 93.7 fL (ref 78.0–100.0)
Platelets: 188 10*3/uL (ref 150–400)
RBC: 3.63 MIL/uL — ABNORMAL LOW (ref 3.87–5.11)
RDW: 17.2 % — ABNORMAL HIGH (ref 11.5–15.5)
WBC: 6.2 10*3/uL (ref 4.0–10.5)

## 2013-05-04 LAB — LACTATE DEHYDROGENASE: LDH: 312 U/L — ABNORMAL HIGH (ref 94–250)

## 2013-05-04 LAB — PROTIME-INR
INR: 2.71 — ABNORMAL HIGH (ref 0.00–1.49)
Prothrombin Time: 27.8 seconds — ABNORMAL HIGH (ref 11.6–15.2)

## 2013-05-04 MED ORDER — HYDRALAZINE HCL 50 MG PO TABS: 50.0000 mg | ORAL_TABLET | Freq: Three times a day (TID) | ORAL | Status: DC

## 2013-05-04 NOTE — Patient Instructions (Addendum)
1.  Increase Coreg to 12.5 mg twice daily 2.  Return to VAD clinic 2 weeks 3.  Will ask Home Health to draw INR next week for coumadin clinic 4.  Increase dressing changes to every two days

## 2013-05-04 NOTE — Progress Notes (Signed)
  Echocardiogram 2D Echocardiogram with RAMP echo has been performed.  Kimiah Hibner FRANCES 05/04/2013, 4:20 PM

## 2013-05-04 NOTE — Patient Instructions (Signed)
Hold dose today; then resume 2.5 mg daily

## 2013-05-06 DIAGNOSIS — I509 Heart failure, unspecified: Secondary | ICD-10-CM | POA: Diagnosis not present

## 2013-05-06 DIAGNOSIS — I4891 Unspecified atrial fibrillation: Secondary | ICD-10-CM | POA: Diagnosis not present

## 2013-05-06 DIAGNOSIS — I10 Essential (primary) hypertension: Secondary | ICD-10-CM | POA: Diagnosis not present

## 2013-05-06 DIAGNOSIS — Z7901 Long term (current) use of anticoagulants: Secondary | ICD-10-CM | POA: Diagnosis not present

## 2013-05-06 DIAGNOSIS — Z452 Encounter for adjustment and management of vascular access device: Secondary | ICD-10-CM | POA: Diagnosis not present

## 2013-05-06 DIAGNOSIS — I5023 Acute on chronic systolic (congestive) heart failure: Secondary | ICD-10-CM | POA: Diagnosis not present

## 2013-05-07 ENCOUNTER — Other Ambulatory Visit (HOSPITAL_COMMUNITY): Payer: Self-pay | Admitting: *Deleted

## 2013-05-07 DIAGNOSIS — I509 Heart failure, unspecified: Secondary | ICD-10-CM | POA: Diagnosis not present

## 2013-05-07 DIAGNOSIS — I10 Essential (primary) hypertension: Secondary | ICD-10-CM | POA: Diagnosis not present

## 2013-05-07 DIAGNOSIS — Z95811 Presence of heart assist device: Secondary | ICD-10-CM

## 2013-05-07 DIAGNOSIS — I4891 Unspecified atrial fibrillation: Secondary | ICD-10-CM | POA: Diagnosis not present

## 2013-05-07 DIAGNOSIS — Z7901 Long term (current) use of anticoagulants: Secondary | ICD-10-CM

## 2013-05-07 DIAGNOSIS — I5023 Acute on chronic systolic (congestive) heart failure: Secondary | ICD-10-CM | POA: Diagnosis not present

## 2013-05-07 DIAGNOSIS — Z452 Encounter for adjustment and management of vascular access device: Secondary | ICD-10-CM | POA: Diagnosis not present

## 2013-05-09 NOTE — Progress Notes (Signed)
HPI:  Brianna Hanson is a 78 year old female with a PMH of HF due to severe NICM (EF 15% S/P ICD, PAF (maintaing SR on amio), plasma cell disorder (Likely IgA MGUS) - followed by DR. Shadad. Underwent implantation of the HeartMate II LVAD on 04/06/13  Follow up:   Doing very well. Still weak but becoming more active. Pocket pain improving but still bothers her at times. Denies any ab pain. No fevers, chills, edema, dizziness. Energy level improving.   Denies LVAD alarms.  Denies driveline trauma, erythema or drainage.  Denies ICD shocks.   Reports taking Coumadin as prescribed, with exception of missed dose Sunday evening, and adherence to anticoagulation based dietary restrictions.  Denies bright red blood per rectum or melena, no dark urine or hematuria.    Symptom  Yes  No  Details   Angina        x Activity:   Claudication        x How far:   Syncope        x When:   Stroke        x   Orthopnea        x How many pillows: 3 (comfort)  PND        x How often   CPAP        x How many hrs   Pedal edema        x   Abd fullness        x   N&V             x   Good appetite; eating full meals  Diaphoresis        x When:  Bleeding       x   Urine                                   Light yellow urine  SOB        x Activity:  Palpitations               x When:   ICD shock        x   Hospitlizaitons               x When/where/why:    ED visit        x When/where/why:  Other MD        x When/who/why:  Activity    PT twice weekly; pt sleeing 16 hrs/day  Fluid    No limitations (2 liters/day)  Diet    No added salt    Past Medical History  Diagnosis Date  . Cardiac arrest - ventricular fibrillation 12/10    with successful resucitation, S/p ICD  . ICD (implantable cardiac defibrillator) in place     she has received appropriate therapy for VF  . Atrial fibrillation or flutter   . Osteopenia   . HTN (hypertension)     moderate  . Nonischemic cardiomyopathy     followed by Dr Glori Luis at  [facility]  . CHF (congestive heart failure)   . Anemia   . S/P colonoscopy   . Plasma cell disorder 03/20/2012  . Plasma cell disorder 03/20/2012    Current Outpatient Prescriptions  Medication Sig Dispense Refill  . amiodarone (PACERONE) 200 MG tablet TAKE 1 TABLET BY MOUTH EVERY DAY  30 tablet  6  . amLODipine (NORVASC) 10 MG tablet  Take 1 tablet (10 mg total) by mouth daily.  30 tablet  6  . bisacodyl (BISACODYL) 5 MG EC tablet Take 1 tablet (5 mg total) by mouth daily as needed for constipation.  30 tablet  0  . carvedilol (COREG) 12.5 MG tablet Take 0.5 tablets (6.25 mg total) by mouth 2 (two) times daily with a meal.  60 tablet  6  . Cholecalciferol (VITAMIN D3) 1000 UNITS CAPS Take 1 tablet by mouth daily.        [name] gabapentin (NEURONTIN) 100 MG capsule Take 1 capsule (100 mg total) by mouth 1 day or 1 dose. Take one tablet q hs or as directed  30 capsule  2  . hydrALAZINE (APRESOLINE) 50 MG tablet Take 1 tablet (50 mg total) by mouth every 8 (eight) hours.  90 tablet  6  . losartan (COZAAR) 100 MG tablet Take 1 tablet (100 mg total) by mouth daily.  30 tablet  6  . Oxycodone HCl 10 MG TABS Take 1 tablet (10 mg total) by mouth every 4 (four) hours as needed (For severe pain).  30 tablet  0  . pantoprazole (PROTONIX) 40 MG tablet Take 1 tablet (40 mg total) by mouth daily.  30 tablet  6  . potassium chloride SA (K-DUR,KLOR-CON) 20 MEQ tablet Take one tab prn (when pt takes demadex 20 mg for weight > 155 lbs)  30 tablet  6  . spironolactone (ALDACTONE) 25 MG tablet Take 1 tablet (25 mg total) by mouth daily.  30 tablet  6  . torsemide (DEMADEX) 20 MG tablet Take demadex 20 mg if weight above 155 lbs  15 tablet  6  . traMADol (ULTRAM) 50 MG tablet Take 1-2 tablets q 6 hrs for pain.  60 tablet  3  . warfarin (COUMADIN) 2.5 MG tablet Take 2.5 mg by mouth daily.       No current facility-administered medications for this encounter.    Review of patient's allergies indicates no known  allergies.  BP: 100 HR: 70 O2 sat: 96 Weight: 159 Last weight: 153  REVIEW OF SYSTEMS: All systems negative except as listed in HPI, PMH and Problem list.   LVAD INTERROGATION:   HeartMate II LVAD:  Flow 4.4 liters/min, speed 8980, power 5.0, PI 7.1.  Controller Serial # [id number]. Rare PI events  I reviewed the LVAD parameters from today, and compared the results to the patient's prior recorded data.  No programming changes were made.  The LVAD is functioning within specified parameters.  The patient performs LVAD self-test daily.  LVAD interrogation was negative for any significant power changes, alarms or PI events/speed drops.  LVAD equipment check completed and is in good working order.  Back-up equipment present.   LVAD education done on emergency procedures and precautions and reviewed exit site care.   VAD dressing changed using daily dressing kit with silver. Exit site with almost complete tissue ingrowth; no redness, tenderness, drainage, or foul odor noted. Pt denies fever, chills.  Physical Exam: Filed Vitals:   05/04/13 1533  BP: 100/0  Pulse: 70  Weight: 159 lb (72.122 kg)  SpO2: 96%   LVAD parameters: Flow:  4.4 Speed:  8980 PI:  7.1 Power:  5.0 Alarms:  None Events:  Rare PI events Fixed speed:  9000 Low speed limit:  8400   Speed    Flow     PI   Power   LVIDD      AI   Aortic openings  MR      TR  Septum        RV  9000 --- 7.1 5.0 3.9 trivial 0/0 none mild Bows sl right Mod hypokinetic  9200 4.2 6.8 5.0 3.7 trivial 0/0 none mild Midline/sl right Mod hypokinetic  9400 4.1 6.5 5.2 3.3 trivial 0/0 none mild midline Mod hypokinetic                                                                Ramp ECHO performed in echo lab.  At completion of ramp study, patients primary and back up controller programmed:  Fixed speed       9200    RPM  Low speed limit    8600    RPM    GENERAL: Well appearing, female who presents to clinic today in no acute  distress. HEENT: normal  NECK: Supple, JVP 6 .  2+ bilaterally, no bruits.  No lymphadenopathy or thyromegaly appreciated.   CARDIAC:  Mechanical heart sounds with LVAD hum present.  LUNGS:  Clear to auscultation bilaterally.  ABDOMEN:  Soft, round, nontender, positive bowel sounds x4.     LVAD exit site: one suture intact.   Dressing dry and intact.  No erythema or drainage.  Stabilization device present and accurately applied.  Driveline dressing is being changed daily per sterile technique. EXTREMITIES:  Warm and dry, no cyanosis, clubbing, rash or edema  NEUROLOGIC:  Alert and oriented x 4.  Gait steady.  No aphasia.  No dysarthria.  Affect pleasant.      ASSESSMENT AND PLAN:   1. Chronic systolic HF status post LVAD implantation 04/06/13 - Doing very well from a functional standpoint and also handling her VAD. Continue PT.      Volume status and BP on low side. .  Decrease Coreg to 6.125 twice daily. Stop every other day doses of Demadex; take Demadex 20 mg only if weight > 155 lbs 2.  Anticoagulation management - INR goal 1.8 - 2.3.  INR on 04/26/13 was 2.1; pt will continue coumadin 2.5 mg daily. 3.  AF - Maintaining SR on amio. On warfarin for VAD 4.  LVAD pocket pain - appears neuropathic. Continue Neurontin. Can also use XS tylenol 1g every 8 hours as needed.  Ramp study done. I was present for and directed entire study with [name], RN and echo tech. Results documented above. Speed turned up to 9200 with low limit of 8600. Back-up controller programmed as well.   Daniel [name],MD 10:42 PM

## 2013-05-10 ENCOUNTER — Ambulatory Visit: Payer: Self-pay | Admitting: *Deleted

## 2013-05-10 DIAGNOSIS — Z7901 Long term (current) use of anticoagulants: Secondary | ICD-10-CM | POA: Diagnosis not present

## 2013-05-10 DIAGNOSIS — I5023 Acute on chronic systolic (congestive) heart failure: Secondary | ICD-10-CM | POA: Diagnosis not present

## 2013-05-10 DIAGNOSIS — I10 Essential (primary) hypertension: Secondary | ICD-10-CM | POA: Diagnosis not present

## 2013-05-10 DIAGNOSIS — I509 Heart failure, unspecified: Secondary | ICD-10-CM | POA: Diagnosis not present

## 2013-05-10 DIAGNOSIS — Z452 Encounter for adjustment and management of vascular access device: Secondary | ICD-10-CM | POA: Diagnosis not present

## 2013-05-10 DIAGNOSIS — I4891 Unspecified atrial fibrillation: Secondary | ICD-10-CM | POA: Diagnosis not present

## 2013-05-10 LAB — PROTIME-INR: INR: 3.1 — AB (ref 0.9–1.1)

## 2013-05-10 NOTE — Patient Instructions (Addendum)
Hold dose today; then 2.5 mg daily except 1.25 mg on Tues/Sat

## 2013-05-11 DIAGNOSIS — I5023 Acute on chronic systolic (congestive) heart failure: Secondary | ICD-10-CM | POA: Diagnosis not present

## 2013-05-11 DIAGNOSIS — I10 Essential (primary) hypertension: Secondary | ICD-10-CM | POA: Diagnosis not present

## 2013-05-11 DIAGNOSIS — I4891 Unspecified atrial fibrillation: Secondary | ICD-10-CM | POA: Diagnosis not present

## 2013-05-11 DIAGNOSIS — I509 Heart failure, unspecified: Secondary | ICD-10-CM | POA: Diagnosis not present

## 2013-05-11 DIAGNOSIS — Z452 Encounter for adjustment and management of vascular access device: Secondary | ICD-10-CM | POA: Diagnosis not present

## 2013-05-11 DIAGNOSIS — Z7901 Long term (current) use of anticoagulants: Secondary | ICD-10-CM | POA: Diagnosis not present

## 2013-05-13 DIAGNOSIS — I10 Essential (primary) hypertension: Secondary | ICD-10-CM | POA: Diagnosis not present

## 2013-05-13 DIAGNOSIS — I4891 Unspecified atrial fibrillation: Secondary | ICD-10-CM | POA: Diagnosis not present

## 2013-05-13 DIAGNOSIS — I5023 Acute on chronic systolic (congestive) heart failure: Secondary | ICD-10-CM | POA: Diagnosis not present

## 2013-05-13 DIAGNOSIS — Z7901 Long term (current) use of anticoagulants: Secondary | ICD-10-CM | POA: Diagnosis not present

## 2013-05-13 DIAGNOSIS — Z452 Encounter for adjustment and management of vascular access device: Secondary | ICD-10-CM | POA: Diagnosis not present

## 2013-05-13 DIAGNOSIS — I509 Heart failure, unspecified: Secondary | ICD-10-CM | POA: Diagnosis not present

## 2013-05-17 ENCOUNTER — Ambulatory Visit (HOSPITAL_COMMUNITY): Payer: Self-pay | Admitting: Cardiology

## 2013-05-17 DIAGNOSIS — I10 Essential (primary) hypertension: Secondary | ICD-10-CM | POA: Diagnosis not present

## 2013-05-17 DIAGNOSIS — Z452 Encounter for adjustment and management of vascular access device: Secondary | ICD-10-CM | POA: Diagnosis not present

## 2013-05-17 DIAGNOSIS — I4891 Unspecified atrial fibrillation: Secondary | ICD-10-CM | POA: Diagnosis not present

## 2013-05-17 DIAGNOSIS — Z7901 Long term (current) use of anticoagulants: Secondary | ICD-10-CM | POA: Diagnosis not present

## 2013-05-17 DIAGNOSIS — I5023 Acute on chronic systolic (congestive) heart failure: Secondary | ICD-10-CM | POA: Diagnosis not present

## 2013-05-17 DIAGNOSIS — I509 Heart failure, unspecified: Secondary | ICD-10-CM | POA: Diagnosis not present

## 2013-05-17 LAB — PROTIME-INR: INR: 1.7 — AB (ref <=1.1)

## 2013-05-18 DIAGNOSIS — Z7901 Long term (current) use of anticoagulants: Secondary | ICD-10-CM | POA: Diagnosis not present

## 2013-05-18 DIAGNOSIS — Z452 Encounter for adjustment and management of vascular access device: Secondary | ICD-10-CM | POA: Diagnosis not present

## 2013-05-18 DIAGNOSIS — I5023 Acute on chronic systolic (congestive) heart failure: Secondary | ICD-10-CM | POA: Diagnosis not present

## 2013-05-18 DIAGNOSIS — I509 Heart failure, unspecified: Secondary | ICD-10-CM | POA: Diagnosis not present

## 2013-05-18 DIAGNOSIS — I10 Essential (primary) hypertension: Secondary | ICD-10-CM | POA: Diagnosis not present

## 2013-05-18 DIAGNOSIS — I4891 Unspecified atrial fibrillation: Secondary | ICD-10-CM | POA: Diagnosis not present

## 2013-05-20 ENCOUNTER — Ambulatory Visit (HOSPITAL_COMMUNITY)
Admission: RE | Admit: 2013-05-20 | Discharge: 2013-05-20 | Disposition: A | Discharge status: Home / Self Care | Payer: Medicare Other | Source: Ambulatory Visit | Attending: Cardiology | Admitting: Cardiology

## 2013-05-20 ENCOUNTER — Other Ambulatory Visit: Payer: Self-pay | Admitting: *Deleted

## 2013-05-20 ENCOUNTER — Telehealth (HOSPITAL_COMMUNITY): Payer: Self-pay | Admitting: *Deleted

## 2013-05-20 ENCOUNTER — Encounter (HOSPITAL_COMMUNITY): Payer: Self-pay

## 2013-05-20 VITALS — BP 98/0 | HR 66 | Temp 98.1°F | Wt 160.1 lb

## 2013-05-20 DIAGNOSIS — Z95818 Presence of other cardiac implants and grafts: Secondary | ICD-10-CM | POA: Diagnosis not present

## 2013-05-20 DIAGNOSIS — I509 Heart failure, unspecified: Secondary | ICD-10-CM

## 2013-05-20 DIAGNOSIS — I5022 Chronic systolic (congestive) heart failure: Secondary | ICD-10-CM | POA: Diagnosis not present

## 2013-05-20 DIAGNOSIS — Z95811 Presence of heart assist device: Secondary | ICD-10-CM

## 2013-05-20 DIAGNOSIS — I50810 Right heart failure, unspecified: Secondary | ICD-10-CM

## 2013-05-20 LAB — CBC
HCT: 33.9 % — ABNORMAL LOW (ref 36.0–46.0)
Hemoglobin: 10.9 g/dL — ABNORMAL LOW (ref 12.0–15.0)
MCH: 30.1 pg (ref 26.0–34.0)
MCHC: 32.2 g/dL (ref 30.0–36.0)
MCV: 93.6 fL (ref 78.0–100.0)
Platelets: 214 10*3/uL (ref 150–400)
RBC: 3.62 MIL/uL — ABNORMAL LOW (ref 3.87–5.11)
RDW: 16.3 % — ABNORMAL HIGH (ref 11.5–15.5)
WBC: 6.6 10*3/uL (ref 4.0–10.5)

## 2013-05-20 LAB — LACTATE DEHYDROGENASE: LDH: 295 U/L — ABNORMAL HIGH (ref 94–250)

## 2013-05-20 LAB — BASIC METABOLIC PANEL
BUN: 8 mg/dL (ref 6–23)
CO2: 22 mEq/L (ref 19–32)
Calcium: 9.1 mg/dL (ref 8.4–10.5)
Chloride: 102 mEq/L (ref 96–112)
Creatinine, Ser: 0.88 mg/dL (ref 0.50–1.10)
GFR calc Af Amer: 72 mL/min — ABNORMAL LOW (ref >90)
GFR calc non Af Amer: 62 mL/min — ABNORMAL LOW (ref >90)
Glucose, Bld: 97 mg/dL (ref 70–99)
Potassium: 3.3 mEq/L — ABNORMAL LOW (ref 3.5–5.1)
Sodium: 135 mEq/L (ref 135–145)

## 2013-05-20 MED ORDER — HYDRALAZINE HCL 50 MG PO TABS: ORAL_TABLET | ORAL | Status: DC

## 2013-05-20 NOTE — Progress Notes (Signed)
HPI:  Brianna Hanson is a 78 year old female with a PMH of HF due to severe NICM (EF 15% S/P ICD, PAF (maintaing SR on amio), plasma cell disorder (Likely IgA MGUS) - followed by DR. Shadad. Underwent implantation of the HeartMate II LVAD on 04/06/13.  Here for routine f/u.  Follow up:   Doing very well. Activity level improving with PT twice weekly; endurance improving, pt out of bed all day with few short naps. Pocket pain much improved with neurontin 100 mg hs. Now taking demadex only when weight up. Took 3 times in last few weeks. Denies any ab pain. No fevers, chills, edema, dizziness. No bleeding. INR running a bit low. Had episode last week during which Brianna Hanson felt Brianna Hanson hear a gushing sound from her pump but no alarms or symptoms associated with it.  Carvedilol recently increased for HTN. Recent RAMP study reviewed.   Denies LVAD alarms.  Denies driveline trauma, erythema or drainage.  Denies ICD shocks.   Reports taking Coumadin as prescribed, with exception of missed dose Sunday evening, and adherence to anticoagulation based dietary restrictions.  Denies bright red blood per rectum or melena, no dark urine or hematuria.    Symptom  Yes  No  Details   Angina        x Activity:   Claudication        x How far:   Syncope        x When:   Stroke        x   Orthopnea        x How many pillows: 2 (comfort)  PND        x How often   CPAP        x How many hrs   Pedal edema        x   Abd fullness        x   N&V             x   Good appetite; full meal  Diaphoresis        x When:  Bleeding       x   Urine                                   Light yellow urine  SOB        x Activity:  Palpitations               x When:  ICD shock        x   Hospitlizaitons         x   When/where/why:    ED visit        x When/where/why:  Other MD        x When/who/why:  Activity    PT twice weekly; increasing daily activity; few short naps  Fluid    No limitations (2 liters/day)  Diet    No added salt     Past Medical History  Diagnosis Date  . Cardiac arrest - ventricular fibrillation 12/10    with successful resucitation, S/p ICD  . ICD (implantable cardiac defibrillator) in place     Brianna Hanson has received appropriate therapy for VF  . Atrial fibrillation or flutter   . Osteopenia   . HTN (hypertension)     moderate  . Nonischemic cardiomyopathy     followed by Dr Glori Luis at  [facility]  . CHF (congestive heart failure)   . Anemia   . S/P colonoscopy   . Plasma cell disorder 03/20/2012  . Plasma cell disorder 03/20/2012    Current Outpatient Prescriptions  Medication Sig Dispense Refill  . amiodarone (PACERONE) 200 MG tablet TAKE 1 TABLET BY MOUTH EVERY DAY  30 tablet  6  . amLODipine (NORVASC) 10 MG tablet Take 1 tablet (10 mg total) by mouth daily.  30 tablet  6  . bisacodyl (BISACODYL) 5 MG EC tablet Take 1 tablet (5 mg total) by mouth daily as needed for constipation.  30 tablet  0  . carvedilol (COREG) 12.5 MG tablet Take 12.5 mg by mouth 2 (two) times daily with a meal.      . Cholecalciferol (VITAMIN D3) 1000 UNITS CAPS Take 1 tablet by mouth daily.        [name] gabapentin (NEURONTIN) 100 MG capsule Take 1 capsule (100 mg total) by mouth 1 day or 1 dose. Take one tablet q hs or as directed  30 capsule  2  . hydrALAZINE (APRESOLINE) 50 MG tablet Take 1 tablet (50 mg total) by mouth every 8 (eight) hours.  90 tablet  6  . losartan (COZAAR) 100 MG tablet Take 1 tablet (100 mg total) by mouth daily.  30 tablet  6  . pantoprazole (PROTONIX) 40 MG tablet Take 1 tablet (40 mg total) by mouth daily.  30 tablet  6  . potassium chloride SA (K-DUR,KLOR-CON) 20 MEQ tablet Take one tab prn (when pt takes demadex 20 mg for weight > 155 lbs)  30 tablet  6  . spironolactone (ALDACTONE) 25 MG tablet Take 1 tablet (25 mg total) by mouth daily.  30 tablet  6  . torsemide (DEMADEX) 20 MG tablet Take demadex 20 mg if weight above 155 lbs  15 tablet  6  . traMADol (ULTRAM) 50 MG tablet Take 1-2 tablets q 6  hrs for pain.  60 tablet  3  . warfarin (COUMADIN) 2.5 MG tablet Take 2.5 mg by mouth daily.      . hydrALAZINE (APRESOLINE) 50 MG tablet 75 mg three times day or as directed  180 tablet  3  . Oxycodone HCl 10 MG TABS Take 1 tablet (10 mg total) by mouth every 4 (four) hours as needed (For severe pain).  30 tablet  0   No current facility-administered medications for this encounter.    Review of patient's allergies indicates no known allergies.  REVIEW OF SYSTEMS: All systems negative except as listed in HPI, PMH and Problem list.   LVAD INTERROGATION:   HeartMate II LVAD:  Flow 3.8 liters/min, speed 8980, power 4.6, PI 5.9.  Controller Serial # [id number]. Few PI events - down from previous.  I reviewed the LVAD parameters from today, and compared the results to the patient's prior recorded data.  No programming changes were made.  The LVAD is functioning within specified parameters.  The patient performs LVAD self-test daily.  LVAD interrogation was negative for any significant power changes, alarms or PI events/speed drops.  LVAD equipment check completed and is in good working order.  Back-up equipment present.   LVAD education done on emergency procedures and precautions and reviewed exit site care.   Physical Exam: Filed Vitals:   05/20/13 1358  BP: 98/0  Pulse: 66  Temp: 98.1 F (36.7 C)  Weight: 160 lb 1.9 oz (72.63 kg)  SpO2: 98%  Last weight: 159 lbs  LVAD parameters: Flow:  3.5 Speed:  9190 PI:  5.1 Power:  4.7 Alarms:  None Events:  Few PI events Fixed speed:  9200 Low speed limit:  8600  VAD coordinator perfomred site care performed using sterile technique. Drive line exit site cleaned with Chlora prep applicators x 2, allowed to dry, and gauze dressing with aquacel strip re-applied. Exit site with near complete tissue ingrowth; no redness, tenderness, drainage, or foul odor noted. Drive line anchor device in place. Husband has been performing dressing changes every  other day; will increase to every third day.  Pt and husband deny any VAD alarms or issues and have adequate dressing supplies at home.  GENERAL: Well appearing, female who presents to clinic today in no acute distress. HEENT: normal  NECK: Supple, JVP 5  2+ bilaterally, no bruits.  No lymphadenopathy or thyromegaly appreciated.   CARDIAC:  Mechanical heart sounds with LVAD hum present.  LUNGS:  Clear to auscultation bilaterally.  ABDOMEN:  Soft, round, nontender, positive bowel sounds x4.     LVAD exit site: one suture intact.   Dressing dry and intact.  No erythema or drainage.  Stabilization device present and accurately applied.  Driveline dressing is being changed every other day per sterile technique. EXTREMITIES:  Warm and dry, no cyanosis, clubbing, rash or edema  NEUROLOGIC:  Alert and oriented x 4.  Gait steady.  No aphasia.  No dysarthria.  Affect pleasant.      ASSESSMENT AND PLAN:  1. Chronic systolic HF status post LVAD implantation 04/06/13 - Doing very well from a functional standpoint and also handling her VAD. Continue PT.     MAP still up despite recent increase in carvedilol. Will increase hydralazine to 75 tid. Continue using demadex on PRN basis. 2.  Anticoagulation management - No further bleeding. Would increase INR goal back to 2.0 - 2.5.  3.  HTN - increase hydralazine as above 4.  LVAD pocket pain - improved with Neurontin  Daniel [name],MD 3:36 PM

## 2013-05-20 NOTE — Telephone Encounter (Signed)
Called pt per Brianna Potash, NP and instructed her to take two potassium tablets (K-Dur 20 mq) today; instructed to take one tab when taking PRN Demadex. Asked pt/husband to call if any questions.

## 2013-05-20 NOTE — Patient Instructions (Addendum)
1.  Increase Hydralazine to 75 mg three times daily (take 1.5 tabs three times daily) 2.  Increase dressing changes to every three days 3.  INR check Monday per [facility] 4.  Return to clinic three weeks

## 2013-05-21 DIAGNOSIS — Z452 Encounter for adjustment and management of vascular access device: Secondary | ICD-10-CM | POA: Diagnosis not present

## 2013-05-21 DIAGNOSIS — I509 Heart failure, unspecified: Secondary | ICD-10-CM | POA: Diagnosis not present

## 2013-05-21 DIAGNOSIS — I10 Essential (primary) hypertension: Secondary | ICD-10-CM | POA: Diagnosis not present

## 2013-05-21 DIAGNOSIS — Z7901 Long term (current) use of anticoagulants: Secondary | ICD-10-CM | POA: Diagnosis not present

## 2013-05-21 DIAGNOSIS — I5023 Acute on chronic systolic (congestive) heart failure: Secondary | ICD-10-CM | POA: Diagnosis not present

## 2013-05-21 DIAGNOSIS — I4891 Unspecified atrial fibrillation: Secondary | ICD-10-CM | POA: Diagnosis not present

## 2013-05-24 ENCOUNTER — Ambulatory Visit (HOSPITAL_COMMUNITY): Payer: Self-pay | Admitting: *Deleted

## 2013-05-24 DIAGNOSIS — I509 Heart failure, unspecified: Secondary | ICD-10-CM | POA: Diagnosis not present

## 2013-05-24 DIAGNOSIS — Z7901 Long term (current) use of anticoagulants: Secondary | ICD-10-CM | POA: Diagnosis not present

## 2013-05-24 DIAGNOSIS — Z452 Encounter for adjustment and management of vascular access device: Secondary | ICD-10-CM | POA: Diagnosis not present

## 2013-05-24 DIAGNOSIS — I5023 Acute on chronic systolic (congestive) heart failure: Secondary | ICD-10-CM | POA: Diagnosis not present

## 2013-05-24 DIAGNOSIS — I10 Essential (primary) hypertension: Secondary | ICD-10-CM | POA: Diagnosis not present

## 2013-05-24 DIAGNOSIS — I4891 Unspecified atrial fibrillation: Secondary | ICD-10-CM | POA: Diagnosis not present

## 2013-05-24 LAB — PROTIME-INR: INR: 2.1 — AB (ref 0.9–1.1)

## 2013-05-24 NOTE — Patient Instructions (Signed)
No change in coumadin dose

## 2013-05-25 DIAGNOSIS — Z452 Encounter for adjustment and management of vascular access device: Secondary | ICD-10-CM | POA: Diagnosis not present

## 2013-05-25 DIAGNOSIS — Z7901 Long term (current) use of anticoagulants: Secondary | ICD-10-CM | POA: Diagnosis not present

## 2013-05-25 DIAGNOSIS — I5023 Acute on chronic systolic (congestive) heart failure: Secondary | ICD-10-CM | POA: Diagnosis not present

## 2013-05-25 DIAGNOSIS — I509 Heart failure, unspecified: Secondary | ICD-10-CM | POA: Diagnosis not present

## 2013-05-25 DIAGNOSIS — I10 Essential (primary) hypertension: Secondary | ICD-10-CM | POA: Diagnosis not present

## 2013-05-25 DIAGNOSIS — I4891 Unspecified atrial fibrillation: Secondary | ICD-10-CM | POA: Diagnosis not present

## 2013-05-27 ENCOUNTER — Telehealth (HOSPITAL_COMMUNITY): Payer: Self-pay | Admitting: *Deleted

## 2013-05-27 DIAGNOSIS — I10 Essential (primary) hypertension: Secondary | ICD-10-CM | POA: Diagnosis not present

## 2013-05-27 DIAGNOSIS — Z7901 Long term (current) use of anticoagulants: Secondary | ICD-10-CM | POA: Diagnosis not present

## 2013-05-27 DIAGNOSIS — I509 Heart failure, unspecified: Secondary | ICD-10-CM | POA: Diagnosis not present

## 2013-05-27 DIAGNOSIS — I4891 Unspecified atrial fibrillation: Secondary | ICD-10-CM | POA: Diagnosis not present

## 2013-05-27 DIAGNOSIS — I5023 Acute on chronic systolic (congestive) heart failure: Secondary | ICD-10-CM | POA: Diagnosis not present

## 2013-05-27 DIAGNOSIS — Z452 Encounter for adjustment and management of vascular access device: Secondary | ICD-10-CM | POA: Diagnosis not present

## 2013-05-27 MED ORDER — POTASSIUM CHLORIDE CRYS ER 20 MEQ PO TBCR: 20.0000 meq | EXTENDED_RELEASE_TABLET | Freq: Every day | ORAL | Status: DC

## 2013-05-27 NOTE — Telephone Encounter (Signed)
Message copied by Noralee Space on Thu May 27, 2013  5:25 PM ------      Message from: Arvilla Meres R      Created: Thu May 20, 2013  5:39 PM       Looks good. K is low give 40 kcl and then start 20 meq daily. Recheck 1 week ------

## 2013-05-31 DIAGNOSIS — I10 Essential (primary) hypertension: Secondary | ICD-10-CM | POA: Diagnosis not present

## 2013-05-31 DIAGNOSIS — I4891 Unspecified atrial fibrillation: Secondary | ICD-10-CM | POA: Diagnosis not present

## 2013-05-31 DIAGNOSIS — Z7901 Long term (current) use of anticoagulants: Secondary | ICD-10-CM | POA: Diagnosis not present

## 2013-05-31 DIAGNOSIS — I5023 Acute on chronic systolic (congestive) heart failure: Secondary | ICD-10-CM | POA: Diagnosis not present

## 2013-05-31 DIAGNOSIS — Z452 Encounter for adjustment and management of vascular access device: Secondary | ICD-10-CM | POA: Diagnosis not present

## 2013-05-31 DIAGNOSIS — I509 Heart failure, unspecified: Secondary | ICD-10-CM | POA: Diagnosis not present

## 2013-06-01 ENCOUNTER — Ambulatory Visit (HOSPITAL_COMMUNITY): Payer: Self-pay | Admitting: *Deleted

## 2013-06-01 DIAGNOSIS — I4891 Unspecified atrial fibrillation: Secondary | ICD-10-CM | POA: Diagnosis not present

## 2013-06-01 DIAGNOSIS — I509 Heart failure, unspecified: Secondary | ICD-10-CM | POA: Diagnosis not present

## 2013-06-01 DIAGNOSIS — Z452 Encounter for adjustment and management of vascular access device: Secondary | ICD-10-CM | POA: Diagnosis not present

## 2013-06-01 DIAGNOSIS — I10 Essential (primary) hypertension: Secondary | ICD-10-CM | POA: Diagnosis not present

## 2013-06-01 DIAGNOSIS — Z7901 Long term (current) use of anticoagulants: Secondary | ICD-10-CM | POA: Diagnosis not present

## 2013-06-01 DIAGNOSIS — I5023 Acute on chronic systolic (congestive) heart failure: Secondary | ICD-10-CM | POA: Diagnosis not present

## 2013-06-01 LAB — PROTIME-INR: INR: 2 — AB (ref <=1.1)

## 2013-06-01 NOTE — Patient Instructions (Signed)
No change in coumadin dose

## 2013-06-03 DIAGNOSIS — I5023 Acute on chronic systolic (congestive) heart failure: Secondary | ICD-10-CM | POA: Diagnosis not present

## 2013-06-03 DIAGNOSIS — Z7901 Long term (current) use of anticoagulants: Secondary | ICD-10-CM | POA: Diagnosis not present

## 2013-06-03 DIAGNOSIS — I509 Heart failure, unspecified: Secondary | ICD-10-CM | POA: Diagnosis not present

## 2013-06-03 DIAGNOSIS — I10 Essential (primary) hypertension: Secondary | ICD-10-CM | POA: Diagnosis not present

## 2013-06-03 DIAGNOSIS — Z452 Encounter for adjustment and management of vascular access device: Secondary | ICD-10-CM | POA: Diagnosis not present

## 2013-06-03 DIAGNOSIS — I4891 Unspecified atrial fibrillation: Secondary | ICD-10-CM | POA: Diagnosis not present

## 2013-06-07 ENCOUNTER — Other Ambulatory Visit: Payer: Self-pay | Admitting: *Deleted

## 2013-06-07 DIAGNOSIS — I5023 Acute on chronic systolic (congestive) heart failure: Secondary | ICD-10-CM | POA: Diagnosis not present

## 2013-06-07 DIAGNOSIS — Z7901 Long term (current) use of anticoagulants: Secondary | ICD-10-CM | POA: Diagnosis not present

## 2013-06-07 DIAGNOSIS — I4891 Unspecified atrial fibrillation: Secondary | ICD-10-CM | POA: Diagnosis not present

## 2013-06-07 DIAGNOSIS — Z452 Encounter for adjustment and management of vascular access device: Secondary | ICD-10-CM | POA: Diagnosis not present

## 2013-06-07 DIAGNOSIS — I10 Essential (primary) hypertension: Secondary | ICD-10-CM | POA: Diagnosis not present

## 2013-06-07 DIAGNOSIS — Z95811 Presence of heart assist device: Secondary | ICD-10-CM

## 2013-06-07 DIAGNOSIS — I509 Heart failure, unspecified: Secondary | ICD-10-CM | POA: Diagnosis not present

## 2013-06-09 ENCOUNTER — Ambulatory Visit (HOSPITAL_COMMUNITY)
Admission: RE | Admit: 2013-06-09 | Discharge: 2013-06-09 | Disposition: A | Discharge status: Home / Self Care | Payer: Medicare Other | Source: Ambulatory Visit | Attending: Cardiology | Admitting: Cardiology

## 2013-06-09 ENCOUNTER — Ambulatory Visit (HOSPITAL_COMMUNITY): Payer: Self-pay | Admitting: *Deleted

## 2013-06-09 VITALS — BP 98/0 | HR 66 | Wt 160.0 lb

## 2013-06-09 DIAGNOSIS — I10 Essential (primary) hypertension: Secondary | ICD-10-CM | POA: Insufficient documentation

## 2013-06-09 DIAGNOSIS — Z79899 Other long term (current) drug therapy: Secondary | ICD-10-CM | POA: Insufficient documentation

## 2013-06-09 DIAGNOSIS — Z7901 Long term (current) use of anticoagulants: Secondary | ICD-10-CM

## 2013-06-09 DIAGNOSIS — I5022 Chronic systolic (congestive) heart failure: Secondary | ICD-10-CM | POA: Diagnosis not present

## 2013-06-09 DIAGNOSIS — Z8674 Personal history of sudden cardiac arrest: Secondary | ICD-10-CM | POA: Insufficient documentation

## 2013-06-09 DIAGNOSIS — Z95811 Presence of heart assist device: Secondary | ICD-10-CM

## 2013-06-09 DIAGNOSIS — Z95818 Presence of other cardiac implants and grafts: Secondary | ICD-10-CM

## 2013-06-09 DIAGNOSIS — Z95810 Presence of automatic (implantable) cardiac defibrillator: Secondary | ICD-10-CM | POA: Insufficient documentation

## 2013-06-09 LAB — CBC
HCT: 36.8 % (ref 36.0–46.0)
Hemoglobin: 11.9 g/dL — ABNORMAL LOW (ref 12.0–15.0)
MCH: 30.1 pg (ref 26.0–34.0)
MCHC: 32.3 g/dL (ref 30.0–36.0)
MCV: 93.2 fL (ref 78.0–100.0)
Platelets: 217 10*3/uL (ref 150–400)
RBC: 3.95 MIL/uL (ref 3.87–5.11)
RDW: 15.5 % (ref 11.5–15.5)
WBC: 8.7 10*3/uL (ref 4.0–10.5)

## 2013-06-09 LAB — BASIC METABOLIC PANEL
BUN: 13 mg/dL (ref 6–23)
CO2: 25 mEq/L (ref 19–32)
Calcium: 9.1 mg/dL (ref 8.4–10.5)
Chloride: 104 mEq/L (ref 96–112)
Creatinine, Ser: 1 mg/dL (ref 0.50–1.10)
GFR calc Af Amer: 62 mL/min — ABNORMAL LOW (ref >90)
GFR calc non Af Amer: 53 mL/min — ABNORMAL LOW (ref >90)
Glucose, Bld: 95 mg/dL (ref 70–99)
Potassium: 4.7 mEq/L (ref 3.5–5.1)
Sodium: 137 mEq/L (ref 135–145)

## 2013-06-09 LAB — PROTIME-INR
INR: 1.98 — ABNORMAL HIGH (ref 0.00–1.49)
Prothrombin Time: 21.9 seconds — ABNORMAL HIGH (ref 11.6–15.2)

## 2013-06-09 LAB — LACTATE DEHYDROGENASE: LDH: 290 U/L — ABNORMAL HIGH (ref 94–250)

## 2013-06-09 NOTE — Progress Notes (Signed)
HPI:  Brianna Hanson is a 78 year old female with a PMH of HF due to severe NICM (EF 15% S/P ICD, PAF (maintaing SR on amio), plasma cell disorder (Likely IgA MGUS) - followed by DR. Shadad. Underwent implantation of the HeartMate II LVAD on 04/06/13.  Here for 3 week f/u.  Follow up:   Doing very well. Activity level improving; PT signed off last Thursday; recommend cardiac rehab; endurance improving, pt out of bed all day with few short naps. Pocket pain much improved with neurontin 100 mg hs. Now taking demadex only when weight > 157 - 158 lbs; has taken once in last 3 weeks. Denies any ab pain. No fevers, chills, edema, dizziness. No bleeding. INR pending.  Hydralazine increased to 75 mg tid last visit for HTN; BP today 96 - 98.   Denies LVAD alarms.  Denies driveline trauma, erythema or drainage.  Denies ICD shocks.   Reports taking Coumadin as prescribed, with exception of missed dose Sunday evening, and adherence to anticoagulation based dietary restrictions.  Denies bright red blood per rectum or melena, no dark urine or hematuria.    Symptom  Yes  No  Details   Angina        x Activity:   Claudication        x How far:   Syncope        x When:   Stroke        x   Orthopnea        x How many pillows: 2 (comfort)  PND        x How often   CPAP        x How many hrs   Pedal edema        x   Abd fullness        x   N&V             x   Good appetite; full meal  Diaphoresis        x When:  Bleeding       x   Urine                                   Light yellow urine  SOB        x Activity:  Palpitations               x When:  ICD shock        x   Hospitlizaitons         x   When/where/why:    ED visit        x When/where/why:  Other MD        x When/who/why:  Activity    Walking; cooking   Fluid    No limitations (2 liters/day)  Diet    No added salt    Past Medical History  Diagnosis Date  . Cardiac arrest - ventricular fibrillation 12/10    with successful resucitation, S/p ICD   . ICD (implantable cardiac defibrillator) in place     she has received appropriate therapy for VF  . Atrial fibrillation or flutter   . Osteopenia   . HTN (hypertension)     moderate  . Nonischemic cardiomyopathy     [facility]  . CHF (congestive heart failure)   . Anemia   . S/P colonoscopy   .  Plasma cell disorder 03/20/2012  . Plasma cell disorder 03/20/2012    Current Outpatient Prescriptions  Medication Sig Dispense Refill  . amiodarone (PACERONE) 200 MG tablet TAKE 1 TABLET BY MOUTH EVERY DAY  30 tablet  6  . amLODipine (NORVASC) 10 MG tablet Take 1 tablet (10 mg total) by mouth daily.  30 tablet  6  . carvedilol (COREG) 12.5 MG tablet Take 12.5 mg by mouth 2 (two) times daily with a meal.      . Cholecalciferol (VITAMIN D3) 1000 UNITS CAPS Take 1 tablet by mouth daily.        [name] gabapentin (NEURONTIN) 100 MG capsule Take 1 capsule (100 mg total) by mouth 1 day or 1 dose. Take one tablet q hs or as directed  30 capsule  2  . hydrALAZINE (APRESOLINE) 50 MG tablet 75 mg three times day or as directed  180 tablet  3  . losartan (COZAAR) 100 MG tablet Take 1 tablet (100 mg total) by mouth daily.  30 tablet  6  . Oxycodone HCl 10 MG TABS Take 1 tablet (10 mg total) by mouth every 4 (four) hours as needed (For severe pain).  30 tablet  0  . pantoprazole (PROTONIX) 40 MG tablet Take 1 tablet (40 mg total) by mouth daily.  30 tablet  6  . potassium chloride SA (K-DUR,KLOR-CON) 20 MEQ tablet Take 1 tablet (20 mEq total) by mouth daily.  30 tablet  3  . spironolactone (ALDACTONE) 25 MG tablet Take 1 tablet (25 mg total) by mouth daily.  30 tablet  6  . torsemide (DEMADEX) 20 MG tablet Take demadex 20 mg if weight above 157 - 158 lbs      . traMADol (ULTRAM) 50 MG tablet Take 1-2 tablets q 6 hrs for pain.  60 tablet  3  . warfarin (COUMADIN) 2.5 MG tablet Take 2.5 mg by mouth daily.      . bisacodyl (BISACODYL) 5 MG EC tablet Take 1 tablet (5 mg total) by mouth daily  as needed for constipation.  30 tablet  0   No current facility-administered medications for this encounter.    Review of patient's allergies indicates no known allergies.  REVIEW OF SYSTEMS: All systems negative except as listed in HPI, PMH and Problem list.   LVAD INTERROGATION:   I reviewed the LVAD parameters from today, and compared the results to the patient's prior recorded data.  No programming changes were made.  The LVAD is functioning within specified parameters.  The patient performs LVAD self-test daily.  LVAD interrogation was negative for any significant power changes, alarms or PI events/speed drops.  LVAD equipment check completed and is in good working order.  Back-up equipment present.   LVAD education done on emergency procedures and precautions and reviewed exit site care.   Physical Exam: Filed Vitals:   06/09/13 1121  BP: 98/0  Pulse: 66  Weight: 160 lb (72.576 kg)  SpO2: 98%  Last weight: 160 lbs  LVAD parameters: Flow:  4.0 Speed:  9180 PI:  6.7 Power:  5.1 Alarms:  None Events:  Rare PI event Fixed speed:  9200 Low speed limit:  8600  VAD coordinator perfomred site care performed using sterile technique. Drive line exit site cleaned with Chlora prep applicators x 2, allowed to dry, and sorba view dressing applied. Husband at bedside; drive line care using weekly dressing kit demonstrated; husband verbalized understanding of same.  Exit site with complete tissue ingrowth; no redness, tenderness,  drainage, or foul odor noted; last stitch removed. Drive line anchor device in place. Husband has been performing dressing changes every three days; will increase to weekly. Instructed to call if change in drive line appearance. Pt and husband deny any VAD alarms or issues and given weekly dressing supplies today.  Patient currently has two pocket controllers version v7.19:  Primary SN: [id number]  Secondary SN: [id number]  Per [employer] recommendation, controllers  replaced with updated version v7.23 controller.  Primary controller change out completed by Dr. Jearld Pies with no adverse affects. New controllers with settings as below:  Primary SN: [id number]  Secondary SN: [id number]  Fixed speed: 9200  Low speed limit: 8600   GENERAL: Well appearing, female who presents to clinic today in no acute distress. HEENT: normal  NECK: Supple, JVP 5  2+ bilaterally, no bruits.  No lymphadenopathy or thyromegaly appreciated.   CARDIAC:  Mechanical heart sounds with LVAD hum present.  LUNGS:  Clear to auscultation bilaterally.  ABDOMEN:  Soft, round, nontender, positive bowel sounds x4.     LVAD exit site: one suture intact.   Dressing dry and intact.  No erythema or drainage.  Stabilization device present and accurately applied.  Driveline dressing is being changed every other day per sterile technique. EXTREMITIES:  Warm and dry, no cyanosis, clubbing, rash or edema  NEUROLOGIC:  Alert and oriented x 4.  Gait steady.  No aphasia.  No dysarthria.  Affect pleasant.      ASSESSMENT AND PLAN:  1. Chronic systolic HF status post LVAD implantation 04/06/13 - Doing very well from a functional standpoint and also handling her VAD.  2.  Anticoagulation management - No further bleeding. Would increase INR goal back to 2.0 - 2.5. INR today 1.98 - no changes in coumadin dose. 3.  HTN - MAP 96 - 98 today; no med changes 4.  LVAD pocket pain - improved with Neurontin 5.  K+ 4.7 - will decrease potassium dose to 10 meq daily 6.  Return to clinic one month   Anne Hahn VAD Coordinator 11:40 AM  Patient seen with VAD coordinator.  I reviewed all aspects of encounter and made changes to the note above.  Patient is doing well.  She has not needed torsemide.  NYHA class II. Continue cardiac rehab.  If MAP remains in the 90s, will increase hydralazine again at next appt.  Overall doing well.   Marca Ancona 06/10/2013

## 2013-06-09 NOTE — Patient Instructions (Signed)
No change in coumadin dose

## 2013-06-09 NOTE — Patient Instructions (Signed)
1.  Decrease K-Dur to 10 meq (1/2 tablet) daily. 2.  INR check 2 weeks 06/23/13 at [facility] on [address] 3.  Return to VAD clinic in one month

## 2013-06-11 ENCOUNTER — Other Ambulatory Visit (HOSPITAL_COMMUNITY): Payer: Self-pay | Admitting: *Deleted

## 2013-06-11 DIAGNOSIS — Z7901 Long term (current) use of anticoagulants: Secondary | ICD-10-CM

## 2013-06-11 DIAGNOSIS — Z95811 Presence of heart assist device: Secondary | ICD-10-CM

## 2013-06-14 ENCOUNTER — Other Ambulatory Visit (HOSPITAL_COMMUNITY): Payer: Self-pay | Admitting: *Deleted

## 2013-06-14 DIAGNOSIS — I5023 Acute on chronic systolic (congestive) heart failure: Secondary | ICD-10-CM | POA: Diagnosis not present

## 2013-06-14 DIAGNOSIS — I509 Heart failure, unspecified: Secondary | ICD-10-CM | POA: Diagnosis not present

## 2013-06-14 DIAGNOSIS — I10 Essential (primary) hypertension: Secondary | ICD-10-CM | POA: Diagnosis not present

## 2013-06-14 DIAGNOSIS — I4891 Unspecified atrial fibrillation: Secondary | ICD-10-CM | POA: Diagnosis not present

## 2013-06-14 DIAGNOSIS — Z452 Encounter for adjustment and management of vascular access device: Secondary | ICD-10-CM | POA: Diagnosis not present

## 2013-06-14 DIAGNOSIS — Z7901 Long term (current) use of anticoagulants: Secondary | ICD-10-CM | POA: Diagnosis not present

## 2013-06-22 ENCOUNTER — Other Ambulatory Visit (HOSPITAL_COMMUNITY): Payer: Self-pay | Admitting: *Deleted

## 2013-06-22 DIAGNOSIS — Z95811 Presence of heart assist device: Secondary | ICD-10-CM

## 2013-06-23 ENCOUNTER — Ambulatory Visit (HOSPITAL_COMMUNITY): Payer: Self-pay | Admitting: *Deleted

## 2013-06-23 ENCOUNTER — Ambulatory Visit (INDEPENDENT_AMBULATORY_CARE_PROVIDER_SITE_OTHER): Payer: Medicare Other | Admitting: *Deleted

## 2013-06-23 DIAGNOSIS — Z95818 Presence of other cardiac implants and grafts: Secondary | ICD-10-CM

## 2013-06-23 DIAGNOSIS — Z7901 Long term (current) use of anticoagulants: Secondary | ICD-10-CM

## 2013-06-23 DIAGNOSIS — Z95811 Presence of heart assist device: Secondary | ICD-10-CM

## 2013-06-23 LAB — PROTIME-INR
INR: 1.7 ratio — ABNORMAL HIGH (ref 0.8–1.0)
Prothrombin Time: 17.6 s — ABNORMAL HIGH (ref 10.2–12.4)

## 2013-06-23 NOTE — Patient Instructions (Signed)
Take extra 2.5 today (for total of 5 mg today) then return to previous dosing; confirmed pt is taking 2.5 mg daily except 1.25 on Tue and Sat.

## 2013-06-25 ENCOUNTER — Encounter: Payer: Self-pay | Admitting: Internal Medicine

## 2013-06-28 ENCOUNTER — Ambulatory Visit (HOSPITAL_COMMUNITY): Payer: Self-pay | Admitting: *Deleted

## 2013-06-28 ENCOUNTER — Ambulatory Visit (INDEPENDENT_AMBULATORY_CARE_PROVIDER_SITE_OTHER): Payer: Medicare Other | Admitting: Internal Medicine

## 2013-06-28 DIAGNOSIS — Z95811 Presence of heart assist device: Secondary | ICD-10-CM

## 2013-06-28 DIAGNOSIS — Z7901 Long term (current) use of anticoagulants: Secondary | ICD-10-CM | POA: Diagnosis not present

## 2013-06-28 DIAGNOSIS — Z95818 Presence of other cardiac implants and grafts: Secondary | ICD-10-CM

## 2013-06-28 LAB — PROTIME-INR
INR: 2.3 ratio — ABNORMAL HIGH (ref 0.8–1.0)
Prothrombin Time: 24.4 s — ABNORMAL HIGH (ref 10.2–12.4)

## 2013-06-29 NOTE — Patient Instructions (Signed)
No change in coumadin dose

## 2013-07-01 ENCOUNTER — Encounter (HOSPITAL_COMMUNITY): Admission: RE | Admit: 2013-07-01 | Payer: Medicare Other | Source: Ambulatory Visit

## 2013-07-01 ENCOUNTER — Encounter (HOSPITAL_COMMUNITY)
Admission: RE | Admit: 2013-07-01 | Discharge: 2013-07-01 | Disposition: A | Discharge status: Home / Self Care | Payer: Medicare Other | Source: Ambulatory Visit | Attending: Internal Medicine | Admitting: Internal Medicine

## 2013-07-05 ENCOUNTER — Encounter (HOSPITAL_COMMUNITY): Payer: Medicare Other

## 2013-07-05 ENCOUNTER — Encounter (HOSPITAL_COMMUNITY)
Admission: RE | Admit: 2013-07-05 | Discharge: 2013-07-05 | Disposition: A | Discharge status: Home / Self Care | Payer: Medicare Other | Source: Ambulatory Visit | Attending: Internal Medicine | Admitting: Internal Medicine

## 2013-07-05 DIAGNOSIS — Z95818 Presence of other cardiac implants and grafts: Secondary | ICD-10-CM | POA: Diagnosis not present

## 2013-07-05 DIAGNOSIS — I5022 Chronic systolic (congestive) heart failure: Secondary | ICD-10-CM | POA: Insufficient documentation

## 2013-07-05 DIAGNOSIS — Z79899 Other long term (current) drug therapy: Secondary | ICD-10-CM | POA: Insufficient documentation

## 2013-07-05 DIAGNOSIS — Z7901 Long term (current) use of anticoagulants: Secondary | ICD-10-CM | POA: Insufficient documentation

## 2013-07-05 DIAGNOSIS — I10 Essential (primary) hypertension: Secondary | ICD-10-CM | POA: Diagnosis not present

## 2013-07-05 DIAGNOSIS — Z95810 Presence of automatic (implantable) cardiac defibrillator: Secondary | ICD-10-CM | POA: Diagnosis not present

## 2013-07-05 DIAGNOSIS — Z8674 Personal history of sudden cardiac arrest: Secondary | ICD-10-CM | POA: Insufficient documentation

## 2013-07-05 DIAGNOSIS — Z5189 Encounter for other specified aftercare: Secondary | ICD-10-CM | POA: Insufficient documentation

## 2013-07-05 NOTE — Progress Notes (Signed)
Pt started cardiac rehab today.  Pt tolerated light exercise without difficulty. Telemetry rhythm Sinus rate 74 with inverted t wave.  Map ranged from 72-94. Brianna Hanson took rest breaks while walking on the track. Will continue to monitor the patient throughout  the program. Brianna Potash NP reviewed ECG tracing via fax. Patient has back up battery with her. Oxygen saturation 95-99% on room air. Brianna Hanson has a follow up appointment at the heart failure clinic on Wednesday morning. Will fax exercise flow sheets to Dr. Prescott Gum office for review.

## 2013-07-07 ENCOUNTER — Encounter (HOSPITAL_COMMUNITY): Payer: Medicare Other

## 2013-07-07 ENCOUNTER — Ambulatory Visit (HOSPITAL_COMMUNITY)
Admission: RE | Admit: 2013-07-07 | Discharge: 2013-07-07 | Disposition: A | Discharge status: Home / Self Care | Payer: Medicare Other | Source: Ambulatory Visit | Attending: Internal Medicine | Admitting: Internal Medicine

## 2013-07-07 ENCOUNTER — Ambulatory Visit (HOSPITAL_COMMUNITY): Payer: Self-pay | Admitting: *Deleted

## 2013-07-07 ENCOUNTER — Encounter (HOSPITAL_COMMUNITY): Admission: RE | Admit: 2013-07-07 | Payer: Medicare Other | Source: Ambulatory Visit

## 2013-07-07 ENCOUNTER — Telehealth (HOSPITAL_COMMUNITY): Payer: Self-pay | Admitting: *Deleted

## 2013-07-07 VITALS — BP 102/0 | HR 64 | Ht 60.0 in | Wt 160.8 lb

## 2013-07-07 DIAGNOSIS — Z95811 Presence of heart assist device: Secondary | ICD-10-CM

## 2013-07-07 DIAGNOSIS — I2721 Secondary pulmonary arterial hypertension: Secondary | ICD-10-CM

## 2013-07-07 DIAGNOSIS — I10 Essential (primary) hypertension: Secondary | ICD-10-CM | POA: Insufficient documentation

## 2013-07-07 DIAGNOSIS — I5022 Chronic systolic (congestive) heart failure: Secondary | ICD-10-CM

## 2013-07-07 DIAGNOSIS — I2789 Other specified pulmonary heart diseases: Secondary | ICD-10-CM | POA: Insufficient documentation

## 2013-07-07 DIAGNOSIS — Z23 Encounter for immunization: Secondary | ICD-10-CM | POA: Diagnosis not present

## 2013-07-07 DIAGNOSIS — I428 Other cardiomyopathies: Secondary | ICD-10-CM | POA: Insufficient documentation

## 2013-07-07 DIAGNOSIS — I509 Heart failure, unspecified: Secondary | ICD-10-CM | POA: Diagnosis not present

## 2013-07-07 DIAGNOSIS — I50810 Right heart failure, unspecified: Secondary | ICD-10-CM

## 2013-07-07 DIAGNOSIS — Z95818 Presence of other cardiac implants and grafts: Secondary | ICD-10-CM | POA: Insufficient documentation

## 2013-07-07 DIAGNOSIS — Z7901 Long term (current) use of anticoagulants: Secondary | ICD-10-CM | POA: Diagnosis not present

## 2013-07-07 LAB — CBC
HCT: 37.7 % (ref 36.0–46.0)
Hemoglobin: 12.6 g/dL (ref 12.0–15.0)
MCH: 31.2 pg (ref 26.0–34.0)
MCHC: 33.4 g/dL (ref 30.0–36.0)
MCV: 93.3 fL (ref 78.0–100.0)
Platelets: 223 10*3/uL (ref 150–400)
RBC: 4.04 MIL/uL (ref 3.87–5.11)
RDW: 15.7 % — ABNORMAL HIGH (ref 11.5–15.5)
WBC: 6.8 10*3/uL (ref 4.0–10.5)

## 2013-07-07 LAB — COMPREHENSIVE METABOLIC PANEL
ALT: 34 U/L (ref 0–35)
AST: 38 U/L — ABNORMAL HIGH (ref 0–37)
Albumin: 3.5 g/dL (ref 3.5–5.2)
Alkaline Phosphatase: 110 U/L (ref 39–117)
BUN: 12 mg/dL (ref 6–23)
CO2: 23 mEq/L (ref 19–32)
Calcium: 9.2 mg/dL (ref 8.4–10.5)
Chloride: 105 mEq/L (ref 96–112)
Creatinine, Ser: 0.99 mg/dL (ref 0.50–1.10)
GFR calc Af Amer: 63 mL/min — ABNORMAL LOW (ref >90)
GFR calc non Af Amer: 54 mL/min — ABNORMAL LOW (ref >90)
Glucose, Bld: 87 mg/dL (ref 70–99)
Potassium: 4 mEq/L (ref 3.5–5.1)
Sodium: 140 mEq/L (ref 135–145)
Total Bilirubin: 0.3 mg/dL (ref 0.3–1.2)
Total Protein: 8.6 g/dL — ABNORMAL HIGH (ref 6.0–8.3)

## 2013-07-07 LAB — LACTATE DEHYDROGENASE: LDH: 336 U/L — ABNORMAL HIGH (ref 94–250)

## 2013-07-07 LAB — PROTIME-INR
INR: 1.97 — ABNORMAL HIGH (ref 0.00–1.49)
Prothrombin Time: 21.8 seconds — ABNORMAL HIGH (ref 11.6–15.2)

## 2013-07-07 MED ORDER — CARVEDILOL 12.5 MG PO TABS: ORAL_TABLET | ORAL | Status: DC

## 2013-07-07 MED ORDER — ISOSORBIDE MONONITRATE ER 30 MG PO TB24: 30.0000 mg | ORAL_TABLET | Freq: Every day | ORAL | Status: DC

## 2013-07-07 MED ORDER — HYDRALAZINE HCL 100 MG PO TABS: 100.0000 mg | ORAL_TABLET | Freq: Three times a day (TID) | ORAL | Status: DC

## 2013-07-07 NOTE — Patient Instructions (Signed)
1.  Increase Coreg to 18.75 mg twice daily 2.  Increase hydralazine to 100 mg three times daily 3.  Start Imdur 30 mg daily 4.  Remote ICD device check with Dr. Jenel Lucks office 5.  May begin showering using VAD shower bag - instuctions reviewed. 6.  Continue weekly dressing changes or as often as need to keep exit site dry.  Call VAD coordinator if any changes in appearance to driveline site. 7.  Increase Coumadin to 2.5 mg daily. 8.  Return to clinic next week for clinic visit with echo.

## 2013-07-07 NOTE — Progress Notes (Signed)
  VAD sorbaview dressing intact; husband performing dressing changes 1x weekly. Drive line anchor device in place.Pt denies fever or chills.     Pt/caregiver deny any alarms or VAD equipment issues. VAD coordinator reviewed daily log from home for daily temperature, weight, and VAD parameters. Pt is performing daily controller and system monitor self tests along with completing weekly and monthly maintenance for LVAD equipment.   LVAD equipment check completed and is in good working order.  Back-up equipment present.   LVAD education done on emergency procedures and precautions and reviewed exit site care.   Instructions given with demonstration using shower bag. Pt may begin taking showers.

## 2013-07-07 NOTE — Progress Notes (Signed)
HPI:  Ms Axley is a 78 year old female with a PMH of HF due to severe NICM (EF 15% S/P ICD, PAF (maintaing SR on amio), plasma cell disorder (Likely IgA MGUS) - followed by DR. Shadad. Underwent implantation of the HeartMate II LVAD on 04/06/13.  Here for 1 month f/u.  Follow up:   Doing very well. Activity level improving; started cardiac rehab three days this week. Endurance improving, pt out of bed all day with few short naps. Pocket pain much improved with neurontin 100 mg hs. Now taking demadex only when weight > 157 - 158 lbs; has not Demadex since last visit. Denies any ab pain. No fevers, chills, edema, dizziness. No bleeding. INR pending.  Hydralazine increased to 75 mg tid last visit for HTN; MAP today 100 - 102.  Denies LVAD alarms.  Denies driveline trauma, erythema or drainage.  Denies ICD shocks. Reports taking Coumadin as prescribed.  Denies bright red blood per rectum or melena, no dark urine or hematuria.    Labs (11/14): K 4, creatinine 0.99, HCT 37.7, LDH 336  Symptom  Yes  No  Details   Angina        x Activity:   Claudication        x How far:   Syncope        x When:   Stroke        x   Orthopnea        x How many pillows: 2 (comfort)  PND        x How often   CPAP        x How many hrs   Pedal edema        x   Abd fullness        x   N&V             x   Good appetite; full meal  Diaphoresis        x When:  Bleeding       x   Urine                                   Light yellow urine  SOB              x Activity:   Palpitations        x        When:  After eating am meal x1; yesterday aftenoon x 1; lasted few seconds  ICD shock        x   Hospitlizaitons               x When/where/why:    ED visit        x When/where/why:  Other MD        x When/who/why:  Activity    Walking; cooking; cardiac rehab  Fluid    No limitations (2 liters/day)  Diet    No added salt    Past Medical History  Diagnosis Date  . Cardiac arrest - ventricular fibrillation 12/10     with successful resucitation, S/p ICD  . ICD (implantable cardiac defibrillator) in place     she has received appropriate therapy for VF  . Atrial fibrillation or flutter   . Osteopenia   . HTN (hypertension)     moderate  . Nonischemic cardiomyopathy     follow[facility] by Dr Glori Luis at [facility]  .  CHF (congestive heart failure)   . Anemia   . S/P colonoscopy   . Plasma cell disorder 03/20/2012  . Plasma cell disorder 03/20/2012    Current Outpatient Prescriptions  Medication Sig Dispense Refill  . amiodarone (PACERONE) 200 MG tablet TAKE 1 TABLET BY MOUTH EVERY DAY  30 tablet  6  . amLODipine (NORVASC) 10 MG tablet Take 1 tablet (10 mg total) by mouth daily.  30 tablet  6  . bisacodyl (BISACODYL) 5 MG EC tablet Take 1 tablet (5 mg total) by mouth daily as needed for constipation.  30 tablet  0  . carvedilol (COREG) 12.5 MG tablet Take 12.5 mg by mouth 2 (two) times daily with a meal.      . Cholecalciferol (VITAMIN D3) 1000 UNITS CAPS Take 1 tablet by mouth daily.        [name] gabapentin (NEURONTIN) 100 MG capsule Take 1 capsule (100 mg total) by mouth 1 day or 1 dose. Take one tablet q hs or as directed  30 capsule  2  . hydrALAZINE (APRESOLINE) 50 MG tablet 75 mg three times day or as directed  180 tablet  3  . losartan (COZAAR) 100 MG tablet Take 1 tablet (100 mg total) by mouth daily.  30 tablet  6  . pantoprazole (PROTONIX) 40 MG tablet Take 1 tablet (40 mg total) by mouth daily.  30 tablet  6  . potassium chloride SA (K-DUR,KLOR-CON) 20 MEQ tablet Take 10 mEq by mouth daily.      [name] spironolactone (ALDACTONE) 25 MG tablet Take 1 tablet (25 mg total) by mouth daily.  30 tablet  6  . warfarin (COUMADIN) 2.5 MG tablet Take 2.5 mg by mouth daily. 2.5 mg daily except 1.25 on  Sat and/or as directed by clinic      . Oxycodone HCl 10 MG TABS Take 1 tablet (10 mg total) by mouth every 4 (four) hours as needed (For severe pain).  30 tablet  0  . torsemide (DEMADEX) 20 MG tablet Take demadex 20 mg if  weight above 157 - 158 lbs      . traMADol (ULTRAM) 50 MG tablet Take 1-2 tablets q 6 hrs for pain.  60 tablet  3   No current facility-administered medications for this encounter.    Review of patient's allergies indicates no known allergies.  REVIEW OF SYSTEMS: All systems negative except as listed in HPI, PMH and Problem list.   I reviewed the LVAD parameters from today, and compared the results to the patient's prior recorded data.  No programming changes were made.  The LVAD is functioning within specified parameters.  The patient performs LVAD self-test daily.  LVAD interrogation was negative for any significant power changes, alarms or PI events/speed drops.  LVAD equipment check completed and is in good working order.  Back-up equipment present.   LVAD education done on emergency procedures and precautions and reviewed exit site care.   Physical Exam: Filed Vitals:   07/07/13 1015  BP: 102/0  Pulse: 64  Height: 5' (1.524 m)  Weight: 160 lb 12.8 oz (72.938 kg)  SpO2: 96%  Last weight: 160 lbs  LVAD parameters: Flow:  4.0 Speed:  9200 PI:  6.0 Power:  5.0 Alarms:  None Events:  Multiple PI events 50 - 75 per 24 hour period Fixed speed:  9200 Low speed limit:  8600   GENERAL: Well appearing, female who presents to clinic today in no acute distress. HEENT: normal  NECK: Supple,  JVP 5  2+ bilaterally, no bruits.  No lymphadenopathy or thyromegaly appreciated.   CARDIAC:  Mechanical heart sounds with LVAD hum present.  LUNGS:  Clear to auscultation bilaterally.  ABDOMEN:  Soft, round, nontender, positive bowel sounds x4.     LVAD exit site: one suture intact.   Dressing dry and intact.  No erythema or drainage.  Stabilization device present and accurately applied.  Driveline dressing is being changed every other day per sterile technique. EXTREMITIES:  Warm and dry, no cyanosis, clubbing, rash or edema  NEUROLOGIC:  Alert and oriented x 4.  Gait steady.  No aphasia.  No  dysarthria.  Affect pleasant.      ASSESSMENT AND PLAN:  1. Chronic systolic HF status post LVAD implantation 04/06/13 - Doing very well from a functional standpoint and also handling her VAD.  2.  Anticoagulation management - No further bleeding. Would increase INR goal back to 2.0 - 2.5. INR today 1.97, will increase coumadin dose to 2.5 mg daily. 3.  HTN - MAP 100 - 102 today.  Increase Coreg to 18.75 bid; increase hydralazine to 100 mg tid 4.  Start Imdur 30 mg daily  Schedule return clinic visit next week with echo.   Anne Hahn VAD Coordinator 10:28 AM  Patient seen with VAD coordinator, agree with the above note.    Symptomatically, patient is doing well.  She continues to run elevated MAPs and also has frequent PI events.  LDH is stable.  It is possible that high MAP is causing flow pulse alteration leading to PI events.  We will need to work on controlling BP better.  Need to consider decreased preload (dehydration, arrhythmia, RV failure) as well.  She does not appear dehydrated. - Will control BP by increasing hydralazine to 100 mg tid (add Imdur 30 mg daily) and increasing Coreg to 18.75 mg bid.   - Will get remote readout of ICD to assess for arrhythmias.   - Bring back in 1 week to follow BP control and assess frequency of PI events.  Will do full echo most likely with ramp study at that time.  - Follow LFTs/TSH on amiodarone.   Marca Ancona 07/07/2013

## 2013-07-08 ENCOUNTER — Ambulatory Visit (HOSPITAL_COMMUNITY): Payer: Medicare Other

## 2013-07-08 ENCOUNTER — Telehealth (HOSPITAL_COMMUNITY): Payer: Self-pay | Admitting: *Deleted

## 2013-07-08 ENCOUNTER — Other Ambulatory Visit (HOSPITAL_COMMUNITY): Payer: Self-pay | Admitting: *Deleted

## 2013-07-08 ENCOUNTER — Encounter (HOSPITAL_COMMUNITY): Payer: Self-pay | Admitting: *Deleted

## 2013-07-08 DIAGNOSIS — Z95811 Presence of heart assist device: Secondary | ICD-10-CM

## 2013-07-08 DIAGNOSIS — Z7901 Long term (current) use of anticoagulants: Secondary | ICD-10-CM

## 2013-07-08 LAB — PREALBUMIN: Prealbumin: 22.9 mg/dL (ref 17.0–34.0)

## 2013-07-08 MED ORDER — WARFARIN SODIUM 2.5 MG PO TABS: 2.5000 mg | ORAL_TABLET | Freq: Every day | ORAL | Status: DC

## 2013-07-08 MED ORDER — ASPIRIN EC 81 MG PO TBEC: 81.0000 mg | DELAYED_RELEASE_TABLET | Freq: Every day | ORAL | Status: DC

## 2013-07-08 NOTE — Telephone Encounter (Signed)
Called pt's husband and confirmed patient is not taking ASA; stopped during VAD implant hospitalization due to nosebleeds.  No further nosebleeds; have increased INR goal to 2.0 - 2.5; will start Aspirin 81 mg daily per Ulla Potash, NP. Husband verbalized understanding of same.

## 2013-07-09 ENCOUNTER — Encounter (HOSPITAL_COMMUNITY): Payer: Medicare Other

## 2013-07-09 ENCOUNTER — Telehealth (HOSPITAL_COMMUNITY): Payer: Self-pay | Admitting: Internal Medicine

## 2013-07-12 ENCOUNTER — Encounter (HOSPITAL_COMMUNITY): Payer: Medicare Other

## 2013-07-12 ENCOUNTER — Encounter: Payer: Medicare Other | Admitting: *Deleted

## 2013-07-14 ENCOUNTER — Encounter (HOSPITAL_COMMUNITY): Payer: Self-pay | Admitting: *Deleted

## 2013-07-14 ENCOUNTER — Ambulatory Visit (HOSPITAL_COMMUNITY)
Admission: RE | Admit: 2013-07-14 | Discharge: 2013-07-14 | Disposition: A | Discharge status: Home / Self Care | Payer: Medicare Other | Source: Ambulatory Visit | Attending: Internal Medicine | Admitting: Internal Medicine

## 2013-07-14 ENCOUNTER — Ambulatory Visit (HOSPITAL_COMMUNITY): Payer: Self-pay | Admitting: *Deleted

## 2013-07-14 ENCOUNTER — Encounter (HOSPITAL_COMMUNITY): Payer: Medicare Other

## 2013-07-14 VITALS — BP 100/0 | HR 60 | Ht 60.0 in | Wt 160.0 lb

## 2013-07-14 DIAGNOSIS — I4891 Unspecified atrial fibrillation: Secondary | ICD-10-CM | POA: Insufficient documentation

## 2013-07-14 DIAGNOSIS — I509 Heart failure, unspecified: Secondary | ICD-10-CM | POA: Diagnosis not present

## 2013-07-14 DIAGNOSIS — I5022 Chronic systolic (congestive) heart failure: Secondary | ICD-10-CM | POA: Diagnosis not present

## 2013-07-14 DIAGNOSIS — Z95818 Presence of other cardiac implants and grafts: Secondary | ICD-10-CM

## 2013-07-14 DIAGNOSIS — I10 Essential (primary) hypertension: Secondary | ICD-10-CM | POA: Insufficient documentation

## 2013-07-14 DIAGNOSIS — I369 Nonrheumatic tricuspid valve disorder, unspecified: Secondary | ICD-10-CM | POA: Diagnosis not present

## 2013-07-14 DIAGNOSIS — M899 Disorder of bone, unspecified: Secondary | ICD-10-CM | POA: Diagnosis not present

## 2013-07-14 DIAGNOSIS — Z95811 Presence of heart assist device: Secondary | ICD-10-CM

## 2013-07-14 DIAGNOSIS — M949 Disorder of cartilage, unspecified: Secondary | ICD-10-CM | POA: Insufficient documentation

## 2013-07-14 DIAGNOSIS — I428 Other cardiomyopathies: Secondary | ICD-10-CM | POA: Diagnosis not present

## 2013-07-14 DIAGNOSIS — Z8674 Personal history of sudden cardiac arrest: Secondary | ICD-10-CM | POA: Diagnosis not present

## 2013-07-14 DIAGNOSIS — Z7901 Long term (current) use of anticoagulants: Secondary | ICD-10-CM

## 2013-07-14 DIAGNOSIS — I079 Rheumatic tricuspid valve disease, unspecified: Secondary | ICD-10-CM | POA: Insufficient documentation

## 2013-07-14 LAB — PROTIME-INR
INR: 1.94 — ABNORMAL HIGH (ref 0.00–1.49)
Prothrombin Time: 21.6 seconds — ABNORMAL HIGH (ref 11.6–15.2)

## 2013-07-14 LAB — CBC
HCT: 38.1 % (ref 36.0–46.0)
Hemoglobin: 12.4 g/dL (ref 12.0–15.0)
MCH: 30 pg (ref 26.0–34.0)
MCHC: 32.5 g/dL (ref 30.0–36.0)
MCV: 92 fL (ref 78.0–100.0)
Platelets: 200 10*3/uL (ref 150–400)
RBC: 4.14 MIL/uL (ref 3.87–5.11)
RDW: 15.7 % — ABNORMAL HIGH (ref 11.5–15.5)
WBC: 6.5 10*3/uL (ref 4.0–10.5)

## 2013-07-14 LAB — BASIC METABOLIC PANEL
BUN: 13 mg/dL (ref 6–23)
CO2: 23 mEq/L (ref 19–32)
Calcium: 8.9 mg/dL (ref 8.4–10.5)
Chloride: 106 mEq/L (ref 96–112)
Creatinine, Ser: 1.09 mg/dL (ref 0.50–1.10)
GFR calc Af Amer: 56 mL/min — ABNORMAL LOW (ref >90)
GFR calc non Af Amer: 48 mL/min — ABNORMAL LOW (ref >90)
Glucose, Bld: 83 mg/dL (ref 70–99)
Potassium: 4 mEq/L (ref 3.5–5.1)
Sodium: 139 mEq/L (ref 135–145)

## 2013-07-14 LAB — LACTATE DEHYDROGENASE: LDH: 304 U/L — ABNORMAL HIGH (ref 94–250)

## 2013-07-14 MED ORDER — AMLODIPINE BESYLATE 10 MG PO TABS: ORAL_TABLET | ORAL | Status: DC

## 2013-07-14 MED ORDER — ASPIRIN EC 81 MG PO TBEC: DELAYED_RELEASE_TABLET | ORAL | Status: DC

## 2013-07-14 NOTE — Patient Instructions (Signed)
1.  Increase norvasc to 10 mg twice daily 2.  Change Aspirin to 81 mg every other day 3.  Return to cardiac rehab 4.  Return to VAD clinic in one month

## 2013-07-14 NOTE — Progress Notes (Unsigned)
Speed    Flow     PI   Power   LVIDD      AI   Aortic openings     MR      TR  Septum        RV  9200 4.6 7.3 5.3 3.3 trace 0/0 none mild midline Mildly hypokinetc   9000 4.5 7.6 4.9 3.58 trace 4/5 none mild Bounce toward right mild  9400 4.4 6.9 5.5 3.4 trace 0/0 none mild Toward left mild                                                               Doppler MAP: 100  Ramp ECHO performed at bedside.  At completion of ramp study, patients primary and back up controller programmed:  Fixed speed                    RPM  Low speed limit     RPM

## 2013-07-14 NOTE — Progress Notes (Signed)
HPI:  Brianna Hanson is a 78 year old female with a PMH of HF due to severe NICM (EF 15% S/P ICD, PAF (maintaing SR on amio), plasma cell disorder (Likely IgA MGUS) - followed by Dr. Clelia Croft. Underwent implantation of the HeartMate II LVAD on 04/06/13.  Follow up:   Doing very well. Activity level improving; started cardiac rehab three days this week. Endurance improving, pt out of bed all day with few short naps. Pocket pain much improved with neurontin 100 mg hs. Now taking demadex only when weight > 157 - 158 lbs; has not Demadex since last visit. Denies any ab pain. No fevers, chills, edema, dizziness. No bleeding. INR pending.  Last visit med changes:  Coreg 18.75 bid, Hydralazine 100 tid, added Imdur30 mg daily, and started ASA 81 mg daily. Last visit MAP today 102.  MAP today 100 - 102; pt did not tolerate Imdur or Aspirin - "did not feel well at all"; c/o increased fatigue, but denied any bleeding episodes with ASA.  Denies LVAD alarms.  Denies driveline trauma, erythema or drainage.  Denies ICD shocks. Reports taking Coumadin as prescribed.  Denies bright red blood per rectum or melena, no dark urine or hematuria.    Labs (11/14): K 4, creatinine 0.99, HCT 37.7, LDH 336   Past Medical History  Diagnosis Date  . Cardiac arrest - ventricular fibrillation 12/10    with successful resucitation, S/p ICD  . ICD (implantable cardiac defibrillator) in place     she has received appropriate therapy for VF  . Atrial fibrillation or flutter   . Osteopenia   . HTN (hypertension)     moderate  . Nonischemic cardiomyopathy     follow[facility] by Dr Glori Luis at [facility]  . CHF (congestive heart failure)   . Anemia   . S/P colonoscopy   . Plasma cell disorder 03/20/2012  . Plasma cell disorder 03/20/2012    Current Outpatient Prescriptions  Medication Sig Dispense Refill  . amiodarone (PACERONE) 200 MG tablet TAKE 1 TABLET BY MOUTH EVERY DAY  30 tablet  6  . amLODipine (NORVASC) 10 MG tablet Take 1 tablet  (10 mg total) by mouth daily.  30 tablet  6  . bisacodyl (BISACODYL) 5 MG EC tablet Take 1 tablet (5 mg total) by mouth daily as needed for constipation.  30 tablet  0  . carvedilol (COREG) 12.5 MG tablet Take 18.75 mg twice daily (1.5 tabs twice daily)  60 tablet  3  . Cholecalciferol (VITAMIN D3) 1000 UNITS CAPS Take 1 tablet by mouth daily.        [name] gabapentin (NEURONTIN) 100 MG capsule Take 1 capsule (100 mg total) by mouth 1 day or 1 dose. Take one tablet q hs or as directed  30 capsule  2  . hydrALAZINE (APRESOLINE) 100 MG tablet Take 1 tablet (100 mg total) by mouth 3 (three) times daily.  90 tablet  3  . losartan (COZAAR) 100 MG tablet Take 1 tablet (100 mg total) by mouth daily.  30 tablet  6  . pantoprazole (PROTONIX) 40 MG tablet Take 1 tablet (40 mg total) by mouth daily.  30 tablet  6  . potassium chloride SA (K-DUR,KLOR-CON) 20 MEQ tablet Take 10 mEq by mouth daily.      [name] spironolactone (ALDACTONE) 25 MG tablet Take 1 tablet (25 mg total) by mouth daily.  30 tablet  6  . traMADol (ULTRAM) 50 MG tablet Take 1-2 tablets q 6 hrs for pain.  60 tablet  3  . warfarin (COUMADIN) 2.5 MG tablet Take 1 tablet (2.5 mg total) by mouth daily.  90 tablet  3  . aspirin EC 81 MG tablet Take 1 tablet (81 mg total) by mouth daily.  90 tablet  3  . isosorbide mononitrate (IMDUR) 30 MG 24 hr tablet Take 1 tablet (30 mg total) by mouth daily.  30 tablet  3  . Oxycodone HCl 10 MG TABS Take 1 tablet (10 mg total) by mouth every 4 (four) hours as needed (For severe pain).  30 tablet  0  . torsemide (DEMADEX) 20 MG tablet Take demadex 20 mg if weight above 157 - 158 lbs       No current facility-administered medications for this encounter.    Review of patient's allergies indicates no known allergies.  REVIEW OF SYSTEMS: All systems negative except as listed in HPI, PMH and Problem list.   I reviewed the LVAD parameters from today, and compared the results to the patient's prior recorded data.  No  programming changes were made.  The LVAD is functioning within specified parameters.  The patient performs LVAD self-test daily.  LVAD interrogation was negative for any significant power changes, alarms or PI events/speed drops.  LVAD equipment check completed and is in good working order.  Back-up equipment present.   LVAD education done on emergency procedures and precautions and reviewed exit site care.   Physical Exam: Filed Vitals:   07/14/13 1113  BP: 100/0  HR:  60 O2 Sat:  99 Wt:  160 Last weight: 160.8  Lbs  LVAD parameters: Flow:  4.6 Speed:  9200 PI:  7.3 Power:  5.3 Alarms:  None Events:  Multiple PI events @ 30 per 24 hours Fixed speed:  9200 Low speed limit:  8600   Speed  Flow  PI  Power  LVIDD  AI  Aortic openings  MR  TR  Septum  RV   9200  4.6  7.3  5.3  3.3  trace  0/0  none  mild  midline  Mildly hypokinetc   9000  4.5  7.6  4.9  3.58  trace  4/5  none  mild  Bounce toward right  mild   9400  4.4  6.9  5.5  3.4  trace  0/0  none  mild  Toward left  mild    Doppler MAP: 100  Ramp ECHO performed at bedside.  At completion of ramp study, patients primary and back up controller programmed:  Fixed speed RPM 9200 Low speed limit RPM 8600  Pt attempted 6 min walk - completed 400 feet in 2 min 40 secs and stopped due to SOB and fatigue.  GENERAL: Well appearing, female who presents to clinic today in no acute distress. HEENT: normal  NECK: Supple, JVP 5  2+ bilaterally, no bruits.  No lymphadenopathy or thyromegaly appreciated.   CARDIAC:  LVAD hum present.  LUNGS:  Clear to auscultation bilaterally.  ABDOMEN:  Soft, round, nontender, positive bowel sounds x4.     LVAD exit site: one suture intact.   Dressing dry and intact.  No erythema or drainage.  Stabilization device present and accurately applied.  Driveline dressing is being changed every other day per sterile technique. EXTREMITIES:  Warm and dry, no cyanosis, clubbing, rash or edema  NEUROLOGIC:  Alert  and oriented x 4.  Gait steady.  No aphasia.  No dysarthria.  Affect pleasant.      ASSESSMENT AND PLAN:  1. Chronic systolic HF status post LVAD implantation 04/06/13.  NYHA Class II-III 2.  Anticoagulation management goal: 2.0 - 2.5;   INR today 1.94 3.  HTN - MAP 100 - 102 today.  Increase Norvasc to 10 mg bid 4.  Stop Imdur 5.  Decrease ASA to 81 mg every other day.  She is doing remarkably well s/p VAD placement. Continues to improve with cardiac rehab. Volume status looks good. MAPs elevated so will increase amlodipine. We did RAMP study in Clinic today and results documented above. Still with a moderate amount of PI events of controller but speed appears optimized. Check labs today.  Schedule return clinic visit in one month.  Neeya Prigmore,MD 11:53 AM

## 2013-07-14 NOTE — Progress Notes (Signed)
  Echocardiogram 2D Echocardiogram has been performed.  Daishon Chui, [facility] 07/14/2013, 11:26 AM

## 2013-07-14 NOTE — Progress Notes (Addendum)
  Symptom  Yes  No  Details   Angina        x Activity:   Claudication        x How far:   Syncope        x When:   Stroke        x   Orthopnea        x How many pillows: 2 (comfort)  PND        x How often   CPAP        x How many hrs   Pedal edema        x   Abd fullness        x   N&V             x   Good appetite; full meal  Diaphoresis        x When:  Bleeding       x   Urine                                   Light yellow urine  SOB              x Activity:   Palpitations        x        When:  After eating am meal x1; yesterday aftenoon x 1; lasted few seconds  ICD shock        x   Hospitlizaitons               x When/where/why:    ED visit        x When/where/why:  Other MD        x When/who/why:  Activity    Walking; cooking; cardiac rehab  Fluid    No limitations (2 liters/day)  Diet    No added salt    LVAD interrogation reveals:  Speed:  9200 Flow:  4.6 Power:  5.3 PI: 7.3 Alarms:  none Events: @ 30 PI events Fixed speed:  9200 Low speed limit: 8600  Driveline sorbaview dressing dry and intact with attachment device in place. Husband performing dressing changes weekly. Patient showered one time, but did not attempt again after feeling tired from Imdur. Pt will resume cardiac rehab and showering now that she is feeling better.    Pt/caregiver deny any alarms or VAD equipment issues. VAD coordinator reviewed daily log from home for daily temperature, weight, and VAD parameters. Pt is performing daily controller and system monitor self tests along with completing weekly and monthly maintenance for LVAD equipment.   LVAD equipment check completed and is in good working order.  Back-up equipment present.   LVAD education done on emergency procedures and precautions and reviewed exit site care.

## 2013-07-15 ENCOUNTER — Telehealth (HOSPITAL_COMMUNITY): Payer: Self-pay | Admitting: *Deleted

## 2013-07-15 ENCOUNTER — Other Ambulatory Visit: Payer: Self-pay | Admitting: *Deleted

## 2013-07-15 DIAGNOSIS — Z7901 Long term (current) use of anticoagulants: Secondary | ICD-10-CM

## 2013-07-15 DIAGNOSIS — Z95811 Presence of heart assist device: Secondary | ICD-10-CM

## 2013-07-16 ENCOUNTER — Encounter (HOSPITAL_COMMUNITY): Payer: Medicare Other

## 2013-07-19 ENCOUNTER — Encounter (HOSPITAL_COMMUNITY)
Admission: RE | Admit: 2013-07-19 | Discharge: 2013-07-19 | Disposition: A | Discharge status: Home / Self Care | Payer: Medicare Other | Source: Ambulatory Visit | Attending: Internal Medicine | Admitting: Internal Medicine

## 2013-07-21 ENCOUNTER — Encounter (HOSPITAL_COMMUNITY)
Admission: RE | Admit: 2013-07-21 | Discharge: 2013-07-21 | Disposition: A | Discharge status: Home / Self Care | Payer: Medicare Other | Source: Ambulatory Visit | Attending: Internal Medicine | Admitting: Internal Medicine

## 2013-07-21 NOTE — Progress Notes (Signed)
Reviewed home exercise with pt today.  Pt plans to walk at home for exercise.  Reviewed THR, RPE, sign and symptoms, and when to call 911 or MD.  Pt voiced understanding. Fabio Pierce, MA, ACSM RCEP

## 2013-07-23 ENCOUNTER — Encounter (HOSPITAL_COMMUNITY)
Admission: RE | Admit: 2013-07-23 | Discharge: 2013-07-23 | Disposition: A | Discharge status: Home / Self Care | Payer: Medicare Other | Source: Ambulatory Visit | Attending: Internal Medicine | Admitting: Internal Medicine

## 2013-07-25 ENCOUNTER — Other Ambulatory Visit (HOSPITAL_COMMUNITY): Payer: Self-pay | Admitting: Internal Medicine

## 2013-07-26 ENCOUNTER — Ambulatory Visit (INDEPENDENT_AMBULATORY_CARE_PROVIDER_SITE_OTHER): Payer: Medicare Other | Admitting: *Deleted

## 2013-07-26 ENCOUNTER — Encounter: Payer: Self-pay | Admitting: Internal Medicine

## 2013-07-26 ENCOUNTER — Encounter (HOSPITAL_COMMUNITY)
Admission: RE | Admit: 2013-07-26 | Discharge: 2013-07-26 | Disposition: A | Discharge status: Home / Self Care | Payer: Medicare Other | Source: Ambulatory Visit | Attending: Internal Medicine | Admitting: Internal Medicine

## 2013-07-26 DIAGNOSIS — I5022 Chronic systolic (congestive) heart failure: Secondary | ICD-10-CM | POA: Diagnosis not present

## 2013-07-26 DIAGNOSIS — I4891 Unspecified atrial fibrillation: Secondary | ICD-10-CM

## 2013-07-26 DIAGNOSIS — I428 Other cardiomyopathies: Secondary | ICD-10-CM | POA: Diagnosis not present

## 2013-07-26 LAB — MDC_IDC_ENUM_SESS_TYPE_REMOTE
Battery Remaining Longevity: 46 mo
Battery Remaining Percentage: 61 %
Battery Voltage: 2.95 V
Brady Statistic AP VP Percent: 1 %
Brady Statistic AP VS Percent: 11 %
Brady Statistic AS VP Percent: 1 %
Brady Statistic AS VS Percent: 88 %
Brady Statistic RA Percent Paced: 10 %
Brady Statistic RV Percent Paced: 1 %
Date Time Interrogation Session: 20141124152339
HighPow Impedance: 40 Ohm
Implantable Pulse Generator Serial Number: 754815
Lead Channel Impedance Value: 280 Ohm
Lead Channel Impedance Value: 300 Ohm
Lead Channel Pacing Threshold Amplitude: 0.75 V
Lead Channel Pacing Threshold Amplitude: 1.25 V
Lead Channel Pacing Threshold Pulse Width: 0.5 ms
Lead Channel Pacing Threshold Pulse Width: 0.5 ms
Lead Channel Sensing Intrinsic Amplitude: 1.1 mV
Lead Channel Sensing Intrinsic Amplitude: 10.4 mV
Lead Channel Setting Pacing Amplitude: 5 V
Lead Channel Setting Pacing Amplitude: 5 V
Lead Channel Setting Pacing Pulse Width: 0.5 ms
Lead Channel Setting Sensing Sensitivity: 0.5 mV
Zone Setting Detection Interval: 300 ms
Zone Setting Detection Interval: 350 ms

## 2013-07-28 ENCOUNTER — Ambulatory Visit (HOSPITAL_COMMUNITY): Payer: Self-pay | Admitting: *Deleted

## 2013-07-28 ENCOUNTER — Ambulatory Visit (INDEPENDENT_AMBULATORY_CARE_PROVIDER_SITE_OTHER): Payer: Medicare Other | Admitting: *Deleted

## 2013-07-28 ENCOUNTER — Telehealth (HOSPITAL_COMMUNITY): Payer: Self-pay | Admitting: Internal Medicine

## 2013-07-28 ENCOUNTER — Encounter (HOSPITAL_COMMUNITY): Payer: Medicare Other

## 2013-07-28 DIAGNOSIS — I5023 Acute on chronic systolic (congestive) heart failure: Secondary | ICD-10-CM | POA: Diagnosis not present

## 2013-07-28 DIAGNOSIS — I4891 Unspecified atrial fibrillation: Secondary | ICD-10-CM | POA: Diagnosis not present

## 2013-07-28 DIAGNOSIS — Z95811 Presence of heart assist device: Secondary | ICD-10-CM

## 2013-07-28 DIAGNOSIS — Z95818 Presence of other cardiac implants and grafts: Secondary | ICD-10-CM | POA: Diagnosis not present

## 2013-07-28 DIAGNOSIS — Z7901 Long term (current) use of anticoagulants: Secondary | ICD-10-CM

## 2013-07-28 DIAGNOSIS — I4892 Unspecified atrial flutter: Secondary | ICD-10-CM

## 2013-07-28 DIAGNOSIS — Z8679 Personal history of other diseases of the circulatory system: Secondary | ICD-10-CM

## 2013-07-28 LAB — PROTIME-INR
INR: 2.2 ratio — ABNORMAL HIGH (ref 0.8–1.0)
Prothrombin Time: 23.3 s — ABNORMAL HIGH (ref 10.2–12.4)

## 2013-07-30 ENCOUNTER — Encounter (HOSPITAL_COMMUNITY): Payer: Medicare Other

## 2013-08-01 ENCOUNTER — Other Ambulatory Visit: Payer: Self-pay | Admitting: Internal Medicine

## 2013-08-02 ENCOUNTER — Encounter (HOSPITAL_COMMUNITY)
Admission: RE | Admit: 2013-08-02 | Discharge: 2013-08-02 | Disposition: A | Discharge status: Home / Self Care | Payer: Medicare Other | Source: Ambulatory Visit | Attending: Internal Medicine | Admitting: Internal Medicine

## 2013-08-02 DIAGNOSIS — I5022 Chronic systolic (congestive) heart failure: Secondary | ICD-10-CM | POA: Diagnosis not present

## 2013-08-02 DIAGNOSIS — I10 Essential (primary) hypertension: Secondary | ICD-10-CM | POA: Diagnosis not present

## 2013-08-02 DIAGNOSIS — Z5189 Encounter for other specified aftercare: Secondary | ICD-10-CM | POA: Insufficient documentation

## 2013-08-02 DIAGNOSIS — Z7901 Long term (current) use of anticoagulants: Secondary | ICD-10-CM | POA: Diagnosis not present

## 2013-08-02 DIAGNOSIS — Z95810 Presence of automatic (implantable) cardiac defibrillator: Secondary | ICD-10-CM | POA: Diagnosis not present

## 2013-08-02 DIAGNOSIS — Z8674 Personal history of sudden cardiac arrest: Secondary | ICD-10-CM | POA: Diagnosis not present

## 2013-08-02 DIAGNOSIS — Z79899 Other long term (current) drug therapy: Secondary | ICD-10-CM | POA: Insufficient documentation

## 2013-08-02 DIAGNOSIS — Z95818 Presence of other cardiac implants and grafts: Secondary | ICD-10-CM | POA: Insufficient documentation

## 2013-08-02 NOTE — Progress Notes (Signed)
Arena complained of feeling weak after getting off the nustep at cardiac rehab this after noon. Doppler blood pressure 76. Oxygen saturation 96% on room air.  No arrhythmias noted on telemetry. Dr [name]'s office called and notified spoke with [name] RN.  Brihanna said she did not feel good earlier today. No further complaints voiced. Will continue to monitor the patient throughout  the program.

## 2013-08-04 ENCOUNTER — Encounter (HOSPITAL_COMMUNITY): Payer: Medicare Other

## 2013-08-06 ENCOUNTER — Encounter (HOSPITAL_COMMUNITY): Payer: Medicare Other

## 2013-08-09 ENCOUNTER — Encounter (HOSPITAL_COMMUNITY): Payer: Medicare Other

## 2013-08-10 ENCOUNTER — Encounter: Payer: Self-pay | Admitting: *Deleted

## 2013-08-11 ENCOUNTER — Ambulatory Visit (INDEPENDENT_AMBULATORY_CARE_PROVIDER_SITE_OTHER): Payer: Medicare Other | Admitting: *Deleted

## 2013-08-11 ENCOUNTER — Encounter (HOSPITAL_COMMUNITY): Payer: Medicare Other

## 2013-08-11 DIAGNOSIS — Z95818 Presence of other cardiac implants and grafts: Secondary | ICD-10-CM | POA: Diagnosis not present

## 2013-08-11 DIAGNOSIS — I5023 Acute on chronic systolic (congestive) heart failure: Secondary | ICD-10-CM

## 2013-08-11 DIAGNOSIS — I4892 Unspecified atrial flutter: Secondary | ICD-10-CM

## 2013-08-11 DIAGNOSIS — Z8679 Personal history of other diseases of the circulatory system: Secondary | ICD-10-CM

## 2013-08-11 DIAGNOSIS — Z7901 Long term (current) use of anticoagulants: Secondary | ICD-10-CM

## 2013-08-11 DIAGNOSIS — I4891 Unspecified atrial fibrillation: Secondary | ICD-10-CM

## 2013-08-11 DIAGNOSIS — Z95811 Presence of heart assist device: Secondary | ICD-10-CM

## 2013-08-12 ENCOUNTER — Ambulatory Visit (HOSPITAL_BASED_OUTPATIENT_CLINIC_OR_DEPARTMENT_OTHER): Payer: Medicare Other | Admitting: Oncology

## 2013-08-12 ENCOUNTER — Telehealth: Payer: Self-pay | Admitting: *Deleted

## 2013-08-12 ENCOUNTER — Other Ambulatory Visit (HOSPITAL_COMMUNITY): Payer: Self-pay | Admitting: *Deleted

## 2013-08-12 ENCOUNTER — Telehealth: Payer: Self-pay | Admitting: Oncology

## 2013-08-12 ENCOUNTER — Other Ambulatory Visit (HOSPITAL_BASED_OUTPATIENT_CLINIC_OR_DEPARTMENT_OTHER): Payer: Medicare Other

## 2013-08-12 ENCOUNTER — Ambulatory Visit (HOSPITAL_COMMUNITY): Payer: Self-pay | Admitting: *Deleted

## 2013-08-12 VITALS — BP 95/71 | HR 62 | Temp 97.4°F | Resp 18 | Ht 60.0 in | Wt 166.2 lb

## 2013-08-12 DIAGNOSIS — D729 Disorder of white blood cells, unspecified: Secondary | ICD-10-CM

## 2013-08-12 DIAGNOSIS — D7289 Other specified disorders of white blood cells: Secondary | ICD-10-CM | POA: Diagnosis not present

## 2013-08-12 DIAGNOSIS — Z7901 Long term (current) use of anticoagulants: Secondary | ICD-10-CM

## 2013-08-12 DIAGNOSIS — I509 Heart failure, unspecified: Secondary | ICD-10-CM

## 2013-08-12 DIAGNOSIS — Z95811 Presence of heart assist device: Secondary | ICD-10-CM

## 2013-08-12 LAB — CBC WITH DIFFERENTIAL/PLATELET
BASO%: 0.6 % (ref 0.0–2.0)
Basophils Absolute: 0 10*3/uL (ref 0.0–0.1)
EOS%: 3.3 % (ref 0.0–7.0)
Eosinophils Absolute: 0.2 10*3/uL (ref 0.0–0.5)
HCT: 34.1 % — ABNORMAL LOW (ref 34.8–46.6)
HGB: 11.5 g/dL — ABNORMAL LOW (ref 11.6–15.9)
LYMPH%: 33.8 % (ref 14.0–49.7)
MCH: 30.6 pg (ref 25.1–34.0)
MCHC: 33.8 g/dL (ref 31.5–36.0)
MCV: 90.3 fL (ref 79.5–101.0)
MONO#: 0.8 10*3/uL (ref 0.1–0.9)
MONO%: 11.5 % (ref 0.0–14.0)
NEUT#: 3.4 10*3/uL (ref 1.5–6.5)
NEUT%: 50.8 % (ref 38.4–76.8)
Platelets: 199 10*3/uL (ref 145–400)
RBC: 3.77 10*6/uL (ref 3.70–5.45)
RDW: 16 % — ABNORMAL HIGH (ref 11.2–14.5)
WBC: 6.7 10*3/uL (ref 3.9–10.3)
lymph#: 2.3 10*3/uL (ref 0.9–3.3)

## 2013-08-12 LAB — COMPREHENSIVE METABOLIC PANEL (CC13)
ALT: 47 U/L (ref 0–55)
AST: 38 U/L — ABNORMAL HIGH (ref 5–34)
Albumin: 3.2 g/dL — ABNORMAL LOW (ref 3.5–5.0)
Alkaline Phosphatase: 101 U/L (ref 40–150)
Anion Gap: 9 mEq/L (ref 3–11)
BUN: 19.4 mg/dL (ref 7.0–26.0)
CO2: 22 mEq/L (ref 22–29)
Calcium: 9 mg/dL (ref 8.4–10.4)
Chloride: 109 mEq/L (ref 98–109)
Creatinine: 1.2 mg/dL — ABNORMAL HIGH (ref 0.6–1.1)
Glucose: 85 mg/dl (ref 70–140)
Potassium: 4.6 mEq/L (ref 3.5–5.1)
Sodium: 140 mEq/L (ref 136–145)
Total Bilirubin: 0.35 mg/dL (ref 0.20–1.20)
Total Protein: 7.6 g/dL (ref 6.4–8.3)

## 2013-08-12 LAB — PROTIME-INR
INR: 3.1 ratio — ABNORMAL HIGH (ref 0.8–1.0)
Prothrombin Time: 32.5 s — ABNORMAL HIGH (ref 10.2–12.4)

## 2013-08-12 NOTE — Telephone Encounter (Signed)
VAD coordinator called [facility] - pt's INR results not available from yesterday visit.  Was informed pt would need to come back for repeat INR; I contacted pt asking her to go back to [facility] lab for repeat INR; pt agreed.

## 2013-08-12 NOTE — Progress Notes (Signed)
Hematology and Oncology Follow Up Visit  Brianna Hanson [id number] 04-05-1935 78 y.o. 08/12/2013 9:23 AM     Principle Diagnosis: 78 year old with plasma cell disorder. Likely IgA MGUS and less likely Myeloma.   Current therapy: observation and surveillance.   Interim History: Mrs. Brianna Hanson returns today for a follow up visit. She was seen last on 01/2013 for the evaluation of a plasma cell disorder.  She Continues to be asymptomatic form that at this point. She is not reporting any back pain.  She is not reporting any pathological fracture. Has not reported any change in her activity level or performance status. She is rather functional, although she does not drive, but able to perform most activities of daily living without any hindrance or decline. She had not had any peripheral neuropathy. Had not reported any immune dysregulation or recurrent sinopulmonary infection. She has LVAD now which has improved her quality of life significantly.  Medications: I have reviewed the patient's current medications. Current Outpatient Prescriptions  Medication Sig Dispense Refill  . amiodarone (PACERONE) 200 MG tablet TAKE 1 TABLET BY MOUTH EVERY DAY  30 tablet  6  . amLODipine (NORVASC) 10 MG tablet Take one tab twice daily  60 tablet  6  . aspirin EC 81 MG tablet Take one tab every other day  90 tablet  3  . bisacodyl (BISACODYL) 5 MG EC tablet Take 1 tablet (5 mg total) by mouth daily as needed for constipation.  30 tablet  0  . carvedilol (COREG) 12.5 MG tablet Take 18.75 mg twice daily (1.5 tabs twice daily)  60 tablet  3  . Cholecalciferol (VITAMIN D3) 1000 UNITS CAPS Take 1 tablet by mouth daily.        [name] gabapentin (NEURONTIN) 100 MG capsule TAKE ONE CAPSULE BY MOUTH AT BEDTIME OR AS DIRECTED  30 capsule  2  . hydrALAZINE (APRESOLINE) 100 MG tablet Take 1 tablet (100 mg total) by mouth 3 (three) times daily.  90 tablet  3  . losartan (COZAAR) 100 MG tablet Take 1 tablet (100 mg total) by mouth  daily.  30 tablet  6  . Oxycodone HCl 10 MG TABS Take 1 tablet (10 mg total) by mouth every 4 (four) hours as needed (For severe pain).  30 tablet  0  . pantoprazole (PROTONIX) 40 MG tablet Take 1 tablet (40 mg total) by mouth daily.  30 tablet  6  . potassium chloride SA (K-DUR,KLOR-CON) 20 MEQ tablet Take 10 mEq by mouth daily.      [name] spironolactone (ALDACTONE) 25 MG tablet Take 1 tablet (25 mg total) by mouth daily.  30 tablet  6  . torsemide (DEMADEX) 20 MG tablet Take demadex 20 mg if weight above 157 - 158 lbs      . traMADol (ULTRAM) 50 MG tablet Take 1-2 tablets q 6 hrs for pain.  60 tablet  3  . warfarin (COUMADIN) 2.5 MG tablet Take 1 tablet (2.5 mg total) by mouth daily.  90 tablet  3   No current facility-administered medications for this visit.    Allergies: No Known Allergies  Past Medical History, Surgical history, Social history, and Family History were reviewed and updated.  Review of Systems:  Remaining ROS negative. Physical Exam: Blood pressure 95/71, pulse 62, temperature 97.4 F (36.3 C), temperature source Oral, resp. rate 18, height 5' (1.524 m), weight 166 lb 3.2 oz (75.388 kg). ECOG: 1 General appearance: alert Head: Normocephalic, without obvious abnormality, atraumatic Neck:  no adenopathy, no carotid bruit, no JVD, supple, symmetrical, trachea midline and thyroid not enlarged, symmetric, no tenderness/mass/nodules Lymph nodes: Cervical, supraclavicular, and axillary nodes normal. Heart:regular rate and rhythm, S1, S2 normal, no murmur, click, rub or gallop Lung:chest clear, no wheezing, rales, normal symmetric air entry Abdomin: soft, non-tender, without masses or organomegaly EXT:no erythema, induration, or nodules   Lab Results: Lab Results  Component Value Date   WBC 6.7 08/12/2013   HGB 11.5* 08/12/2013   HCT 34.1* 08/12/2013   MCV 90.3 08/12/2013   PLT 199 08/12/2013     Chemistry      Component Value Date/Time   NA 139 07/14/2013 1141    NA 143 02/10/2013 1053   K 4.0 07/14/2013 1141   K 3.7 02/10/2013 1053   CL 106 07/14/2013 1141   CL 110* 02/10/2013 1053   CO2 23 07/14/2013 1141   CO2 25 02/10/2013 1053   BUN 13 07/14/2013 1141   BUN 17.5 02/10/2013 1053   CREATININE 1.09 07/14/2013 1141   CREATININE 1.4* 02/10/2013 1053      Component Value Date/Time   CALCIUM 8.9 07/14/2013 1141   CALCIUM 8.9 02/10/2013 1053   ALKPHOS 110 07/07/2013 0957   ALKPHOS 99 02/10/2013 1053   AST 38* 07/07/2013 0957   AST 22 02/10/2013 1053   ALT 34 07/07/2013 0957   ALT 21 02/10/2013 1053   BILITOT 0.3 07/07/2013 0957   BILITOT 0.67 02/10/2013 1053     Results for Lamore, MARSHAWN NINNEMAN (MRN [id number]) as of 08/12/2013 09:15  Ref. Range 03/24/2012 10:42 10/21/2012 09:28 02/10/2013 10:53  IgA Latest Range: 69-380 mg/dL 409 (H) 811 (H) 914 (H)   Results for DAYA, DUTT (MRN [id number]) as of 08/12/2013 09:15  Ref. Range 03/24/2012 10:42 10/21/2012 09:28 02/10/2013 10:53  M-SPIKE, % No range found 0.65 0.92 0.71   Impression and Plan: A 78 year old woman with the following issues:  1. Monoclonal gammopathy, IgA subtype with an M-spike of 0.9 g/dL, IgA level of 086. The differential diagnosis include multiple myeloma (less likely given the lack of end organ damage) vs. monoclonal gammopathy of undetermined significance.  She has no end organ damage to suggest multiple myeloma but she does have a risk of developing that in the future.This risk is estimated about of 1-2% per year.Given that her IgA levels went down and her M spike moved very slightly, I think her risk of progression to myeloma is low.   2. CHF: she is S/P LVAD placement without complications.   3. Followup: Will be in 6 months for repeat protein studies.      [facility], MD 12/11/20149:23 AM

## 2013-08-12 NOTE — Telephone Encounter (Signed)
gv and printed appt sched and avs for pt for June 2015  °

## 2013-08-13 ENCOUNTER — Encounter (HOSPITAL_COMMUNITY): Payer: Medicare Other

## 2013-08-16 ENCOUNTER — Telehealth (HOSPITAL_COMMUNITY): Payer: Self-pay | Admitting: *Deleted

## 2013-08-16 ENCOUNTER — Encounter (HOSPITAL_COMMUNITY): Payer: Medicare Other

## 2013-08-16 LAB — SPEP & IFE WITH QIG
Albumin ELP: 47.1 % — ABNORMAL LOW (ref 55.8–66.1)
Alpha-1-Globulin: 4.6 % (ref 2.9–4.9)
Alpha-2-Globulin: 7.1 % (ref 7.1–11.8)
Beta 2: 21.6 % — ABNORMAL HIGH (ref 3.2–6.5)
Beta Globulin: 5.5 % (ref 4.7–7.2)
Gamma Globulin: 14.1 % (ref 11.1–18.8)
IgA: 1440 mg/dL — ABNORMAL HIGH (ref 69–380)
IgG (Immunoglobin G), Serum: 1320 mg/dL (ref 690–1700)
IgM, Serum: 78 mg/dL (ref 52–322)
M-Spike, %: 1.01 g/dL
Total Protein, Serum Electrophoresis: 7.5 g/dL (ref 6.0–8.3)

## 2013-08-16 LAB — KAPPA/LAMBDA LIGHT CHAINS
Kappa free light chain: 58.1 mg/dL — ABNORMAL HIGH (ref 0.33–1.94)
Kappa:Lambda Ratio: 21.84 — ABNORMAL HIGH (ref 0.26–1.65)
Lambda Free Lght Chn: 2.66 mg/dL — ABNORMAL HIGH (ref 0.57–2.63)

## 2013-08-18 ENCOUNTER — Ambulatory Visit (HOSPITAL_COMMUNITY)
Admission: RE | Admit: 2013-08-18 | Discharge: 2013-08-18 | Disposition: A | Discharge status: Home / Self Care | Payer: Medicare Other | Source: Ambulatory Visit | Attending: Internal Medicine | Admitting: Internal Medicine

## 2013-08-18 ENCOUNTER — Ambulatory Visit (HOSPITAL_COMMUNITY): Payer: Self-pay | Admitting: *Deleted

## 2013-08-18 ENCOUNTER — Encounter (HOSPITAL_COMMUNITY): Payer: Self-pay

## 2013-08-18 ENCOUNTER — Encounter (HOSPITAL_COMMUNITY): Payer: Medicare Other

## 2013-08-18 ENCOUNTER — Other Ambulatory Visit (HOSPITAL_COMMUNITY): Payer: Self-pay | Admitting: *Deleted

## 2013-08-18 VITALS — BP 100/0 | HR 65 | Resp 16 | Ht 60.0 in | Wt 169.1 lb

## 2013-08-18 DIAGNOSIS — I4891 Unspecified atrial fibrillation: Secondary | ICD-10-CM | POA: Diagnosis not present

## 2013-08-18 DIAGNOSIS — Z7901 Long term (current) use of anticoagulants: Secondary | ICD-10-CM

## 2013-08-18 DIAGNOSIS — I4892 Unspecified atrial flutter: Secondary | ICD-10-CM | POA: Diagnosis not present

## 2013-08-18 DIAGNOSIS — Z95811 Presence of heart assist device: Secondary | ICD-10-CM

## 2013-08-18 DIAGNOSIS — I5022 Chronic systolic (congestive) heart failure: Secondary | ICD-10-CM | POA: Diagnosis not present

## 2013-08-18 DIAGNOSIS — Z95818 Presence of other cardiac implants and grafts: Secondary | ICD-10-CM

## 2013-08-18 DIAGNOSIS — Z950 Presence of cardiac pacemaker: Secondary | ICD-10-CM | POA: Insufficient documentation

## 2013-08-18 DIAGNOSIS — Z8679 Personal history of other diseases of the circulatory system: Secondary | ICD-10-CM | POA: Diagnosis not present

## 2013-08-18 DIAGNOSIS — I5023 Acute on chronic systolic (congestive) heart failure: Secondary | ICD-10-CM | POA: Diagnosis not present

## 2013-08-18 LAB — PROTIME-INR
INR: 2.28 — ABNORMAL HIGH (ref 0.00–1.49)
Prothrombin Time: 24.4 seconds — ABNORMAL HIGH (ref 11.6–15.2)

## 2013-08-18 LAB — LACTATE DEHYDROGENASE: LDH: 307 U/L — ABNORMAL HIGH (ref 94–250)

## 2013-08-18 NOTE — Progress Notes (Signed)
Symptom  Yes  No  Details   Angina        x Activity:   Claudication        x How far:   Syncope        x When:   Stroke        x   Orthopnea        x How many pillows:  2  PND        x How often:  CPAP      N/A How many hrs:   Pedal edema        x   Abd fullness        x   N&V        x   Diaphoresis        x When:  Bleeding       x   Urine color   Light yellow  SOB        x Activity:   Palpitations        x When:  ICD shock        x   Hospitlizaitons        x When/where/why:  ED visit        x When/where/why:  Other MD        x  When/who/why: 08/12/13 Dr. Clelia Croft routine f/u  Activity    Cardiac rehab 2 x week (3 x too much)  Fluid    < 1500  Diet    No added salt    LVAD interrogation reveals:  Speed:  9200 Flow:  5.4 Power:  5.5 PI: 6.9 Alarms:  none Events:  PI events (12 per day) Fixed speed:  9200 Low speed limit:  8600  LVAD exit site: Well healed and incorporated. The velour is fully implanted at exit site. Dressing dry and intact. No erythema or drainage. Stabilization device present and accurately applied. Driveline dressing is being changed weekly per sterile technique using p / Sorbaview dressing with biopatch on exit site. Pt denies fever or chills. Pt/caregiver state they have adequate dressing supplies at home.  Pt/caregiver deny any alarms or VAD equipment issues.  Pt is performing daily controller and system monitor self tests along with completing weekly and monthly maintenance for LVAD equipment.   LVAD equipment check completed and is in good working order. Back-up equipment present. LVAD education done on emergency procedures and precautions and reviewed exit site care.

## 2013-08-18 NOTE — Patient Instructions (Signed)
1.  No change in medication doses; may start taking Losartan at 4 pm to help with morning dizziness. 2.  No change in coumadin dose; re-check INR 08/31/13. 3.  Return to VAD clinic in 6 weeks.

## 2013-08-18 NOTE — Progress Notes (Signed)
HPI:  Brianna Hanson is a 77 year old female with a PMH of HF due to severe NICM (EF 15% S/P ICD, PAF (maintaing SR on amio), plasma cell disorder (Likely IgA MGUS) - saw Dr. Clelia Croft on 08/12/13 for routine follow up; no complications, plans to see her in 6 mos and repeat protein studies. Underwent implantation of the HeartMate II LVAD on 04/06/13.  Follow up: Last visit increased norvasc to 10 mg BID and stopped IMDUR and started ASA 81 mg. When she started ASA she says she developed fatigue and dizziness which improved once she stopped taking. Overall feels great. Activity level has leveled off; cardiac rehab 2 days week (feels 3x week too much for her). Pt out of bed all day with few one short nap. Pocket pain much improved with neurontin 100 mg hs. Now taking demadex only when weight > 157 - 158 lbs; is taking once every 2 - 3 weeks. Denies SOB, orthopnea, CP or edema.  Denies LVAD alarms.  Denies driveline trauma, erythema or drainage.  Denies ICD shocks.  Reports taking Coumadin as prescribed.  Denies bright red blood per rectum or melena, no dark urine or hematuria.     Past Medical History  Diagnosis Date  . Cardiac arrest - ventricular fibrillation 12/10    with successful resucitation, S/p ICD  . ICD (implantable cardiac defibrillator) in place     she has received appropriate therapy for VF  . Atrial fibrillation or flutter   . Osteopenia   . HTN (hypertension)     moderate  . Nonischemic cardiomyopathy     followed by Dr Glori Luis at [facility]  . CHF (congestive heart failure)   . Anemia   . S/P colonoscopy   . Plasma cell disorder 03/20/2012  . Plasma cell disorder 03/20/2012    Current Outpatient Prescriptions  Medication Sig Dispense Refill  . amiodarone (PACERONE) 200 MG tablet TAKE 1 TABLET BY MOUTH EVERY DAY  30 tablet  6  . amLODipine (NORVASC) 10 MG tablet Take one tab twice daily  60 tablet  6  . bisacodyl (BISACODYL) 5 MG EC tablet Take 1 tablet (5 mg total) by mouth daily as  needed for constipation.  30 tablet  0  . carvedilol (COREG) 12.5 MG tablet Take 18.75 mg twice daily (1.5 tabs twice daily)  60 tablet  3  . Cholecalciferol (VITAMIN D3) 1000 UNITS CAPS Take 1 tablet by mouth daily.        [name] gabapentin (NEURONTIN) 100 MG capsule TAKE ONE CAPSULE BY MOUTH AT BEDTIME OR AS DIRECTED  30 capsule  2  . hydrALAZINE (APRESOLINE) 100 MG tablet Take 1 tablet (100 mg total) by mouth 3 (three) times daily.  90 tablet  3  . losartan (COZAAR) 100 MG tablet Take 1 tablet (100 mg total) by mouth daily.  30 tablet  6  . pantoprazole (PROTONIX) 40 MG tablet Take 1 tablet (40 mg total) by mouth daily.  30 tablet  6  . potassium chloride SA (K-DUR,KLOR-CON) 20 MEQ tablet Take 10 mEq by mouth daily.      [name] spironolactone (ALDACTONE) 25 MG tablet Take 1 tablet (25 mg total) by mouth daily.  30 tablet  6  . torsemide (DEMADEX) 20 MG tablet Take demadex 20 mg if weight above 157 - 158 lbs      . traMADol (ULTRAM) 50 MG tablet Take 1-2 tablets q 6 hrs for pain.  60 tablet  3  . warfarin (COUMADIN) 2.5 MG  tablet Take 1 tablet (2.5 mg total) by mouth daily.  90 tablet  3  . aspirin EC 81 MG tablet Take one tab every other day  90 tablet  3   No current facility-administered medications for this encounter.    Review of patient's allergies indicates no known allergies.  REVIEW OF SYSTEMS: All systems negative except as listed in HPI, PMH and Problem list.  I reviewed the LVAD parameters from today, and compared the results to the patient's prior recorded data.  No programming changes were made.  The LVAD is functioning within specified parameters.  The patient performs LVAD self-test daily.  LVAD interrogation was negative for any significant power changes, alarms or PI events/speed drops.  LVAD equipment check completed and is in good working order.  Back-up equipment present.   LVAD education done on emergency procedures and precautions and reviewed exit site care.    Filed Vitals:    08/18/13 1045  BP: 100/0  Pulse: 65  Resp: 16  Height: 5' (1.524 m)  Weight: 169 lb 2 oz (76.715 kg)  SpO2: 97%     EKG:  SR with 1st degree AV block; rate 60  LVAD parameters: Flow:  5.4 Speed:  9200 PI:  6.9 Power:  5.5 Alarms:  None Events:  Multiple PI events @ 12 over 24 hours Fixed speed:  9200 Low speed limit:  8600  Physical Exam: GENERAL: Well appearing, female who presents to clinic today in no acute distress. HEENT: normal  NECK: Supple, JVP flat  2+ bilaterally, no bruits.  No lymphadenopathy or thyromegaly appreciated.   CARDIAC:  LVAD hum present.  LUNGS:  Clear to auscultation bilaterally.  ABDOMEN:  Soft, round, nontender, positive bowel sounds x4.     LVAD exit site: one suture intact.   Dressing dry and intact.  No erythema or drainage.  Stabilization device present and accurately applied.  Driveline dressing is being changed every other day per sterile technique. EXTREMITIES:  Warm and dry, no cyanosis, clubbing, rash or edema  NEUROLOGIC:  Alert and oriented x 4.  Gait steady.  No aphasia.  No dysarthria.  Affect pleasant.      ASSESSMENT AND PLAN:  1) Chronic systolic HF: NICM, s/p ICD and LVAD (04/2013) - NYHA II symptoms on LVAD support. Volume status stable will continue torsemide PRN for weight greater than 157 lbs.  - MAP still remains elevated. At this time will not make any medication changes as she is very sensitive to meds. Could try increasing losartan to 150 mg next visit. Can't titrate BB with low HR.  - Reinforced the need and importance of daily weights, a low sodium diet, and fluid restriction (less than 2 L a day). Instructed to call the HF clinic if weight increases more than 3 lbs overnight or 5 lbs in a week.  2) LVAD - All parameters stable. Will check labs today and assess LDH today. Reviewed labs from 12/11. Pt stopped ASA d/t fatigue and dizziness. Have asked her to try back maybe twice weekly.  3) PAF - In NSR today. Will  interrogate device next visit and if remains no VT or Afib will stop Amiodarone.  4) HTN - As above MAP elevated will continue to follow 5) Anticoagulation - INR reviewed from 08/12/13. Will repeat 12/30.  F/U 6 weeks Aundria Rud, NP-C 9:45 PM

## 2013-08-20 ENCOUNTER — Encounter (HOSPITAL_COMMUNITY)
Admission: RE | Admit: 2013-08-20 | Discharge: 2013-08-20 | Disposition: A | Discharge status: Home / Self Care | Payer: Medicare Other | Source: Ambulatory Visit | Attending: Internal Medicine | Admitting: Internal Medicine

## 2013-08-23 ENCOUNTER — Encounter (HOSPITAL_COMMUNITY)
Admission: RE | Admit: 2013-08-23 | Discharge: 2013-08-23 | Disposition: A | Discharge status: Home / Self Care | Payer: Medicare Other | Source: Ambulatory Visit | Attending: Internal Medicine | Admitting: Internal Medicine

## 2013-08-23 NOTE — Progress Notes (Signed)
Brianna Hanson 78 y.o. female Nutrition Note Spoke with pt.  Nutrition Plan and Nutrition Survey goals reviewed with pt. Pt is following Step 2 of the Therapeutic Lifestyle Changes diet. Pt is pre-diabetic. Pt aware of pre-diabetes. Pt states, "they told me in the hospital." Pre-diabetes discussed. Pt with dx of CHF. Pt watching sodium intake. Per discussion, pt has some room for improvement re: sodium intake. Pt is taking Coumadin. Pt states, "I eat greens on Sundays and salads on Wednesdays and that seems to work pretty good." Pt aware of vitamin K intake in greens. Other sources of Vitamin K reviewed. Pt aware of nutrition education classes offered and is unable to attend nutrition classes.  Nutrition Diagnosis   Food-and nutrition-related knowledge deficit related to lack of exposure to information as related to diagnosis of: ? CVD ? Pre-DM (A1c 5.7) ?   Obesity related to excessive energy intake as evidenced by a BMI of 32.1  Nutrition RX/ Estimated Daily Nutrition Needs for: wt maintenance 1650-1900 Kcal, 55-60 gm fat, 11-13 gm sat fat, 1.6-1.9 gm trans-fat, <1500 mg sodium  Nutrition Intervention   Pt's individual nutrition plan reviewed with pt.   Benefits of adopting Therapeutic Lifestyle Changes discussed when Medficts reviewed.   Pt to attend the Portion Distortion class   Pt given handouts for: ? Nutrition I class ? Nutrition II class    Continue client-centered nutrition education by RD, as part of interdisciplinary care. Goal(s)   Pt to describe the benefit of including fruits, vegetables, whole grains, and low-fat dairy products in a heart healthy meal plan. Monitor and Evaluate progress toward nutrition goal with team. Nutrition Risk: Change to Moderate Mickle Plumb, M.Ed, RD, LDN, CDE 08/23/2013 3:32 PM

## 2013-08-25 ENCOUNTER — Encounter (HOSPITAL_COMMUNITY): Payer: Medicare Other

## 2013-08-27 ENCOUNTER — Encounter (HOSPITAL_COMMUNITY): Payer: Medicare Other

## 2013-08-30 ENCOUNTER — Encounter (HOSPITAL_COMMUNITY)
Admission: RE | Admit: 2013-08-30 | Discharge: 2013-08-30 | Disposition: A | Discharge status: Home / Self Care | Payer: Medicare Other | Source: Ambulatory Visit | Attending: Internal Medicine | Admitting: Internal Medicine

## 2013-08-31 ENCOUNTER — Other Ambulatory Visit (INDEPENDENT_AMBULATORY_CARE_PROVIDER_SITE_OTHER): Payer: Medicare Other

## 2013-08-31 ENCOUNTER — Ambulatory Visit (HOSPITAL_COMMUNITY): Payer: Self-pay | Admitting: Pharmacist

## 2013-08-31 DIAGNOSIS — Z7901 Long term (current) use of anticoagulants: Secondary | ICD-10-CM

## 2013-08-31 DIAGNOSIS — I4891 Unspecified atrial fibrillation: Secondary | ICD-10-CM

## 2013-08-31 DIAGNOSIS — I4892 Unspecified atrial flutter: Secondary | ICD-10-CM

## 2013-08-31 DIAGNOSIS — Z8679 Personal history of other diseases of the circulatory system: Secondary | ICD-10-CM

## 2013-08-31 DIAGNOSIS — Z95818 Presence of other cardiac implants and grafts: Secondary | ICD-10-CM

## 2013-08-31 DIAGNOSIS — I5023 Acute on chronic systolic (congestive) heart failure: Secondary | ICD-10-CM | POA: Diagnosis not present

## 2013-08-31 DIAGNOSIS — Z95811 Presence of heart assist device: Secondary | ICD-10-CM

## 2013-08-31 LAB — PROTIME-INR
INR: 1.7 ratio — ABNORMAL HIGH (ref 0.8–1.0)
Prothrombin Time: 18 s — ABNORMAL HIGH (ref 10.2–12.4)

## 2013-09-01 ENCOUNTER — Other Ambulatory Visit (HOSPITAL_COMMUNITY): Payer: Self-pay | Admitting: *Deleted

## 2013-09-01 ENCOUNTER — Encounter (HOSPITAL_COMMUNITY): Payer: Medicare Other

## 2013-09-01 DIAGNOSIS — Z95811 Presence of heart assist device: Secondary | ICD-10-CM

## 2013-09-01 DIAGNOSIS — Z7901 Long term (current) use of anticoagulants: Secondary | ICD-10-CM

## 2013-09-03 ENCOUNTER — Encounter (HOSPITAL_COMMUNITY): Payer: Medicare Other

## 2013-09-06 ENCOUNTER — Encounter (HOSPITAL_COMMUNITY)
Admission: RE | Admit: 2013-09-06 | Discharge: 2013-09-06 | Disposition: A | Discharge status: Home / Self Care | Payer: Medicare Other | Source: Ambulatory Visit | Attending: Internal Medicine | Admitting: Internal Medicine

## 2013-09-06 DIAGNOSIS — Z95810 Presence of automatic (implantable) cardiac defibrillator: Secondary | ICD-10-CM | POA: Insufficient documentation

## 2013-09-06 DIAGNOSIS — Z95818 Presence of other cardiac implants and grafts: Secondary | ICD-10-CM | POA: Diagnosis not present

## 2013-09-06 DIAGNOSIS — I10 Essential (primary) hypertension: Secondary | ICD-10-CM | POA: Insufficient documentation

## 2013-09-06 DIAGNOSIS — Z7901 Long term (current) use of anticoagulants: Secondary | ICD-10-CM | POA: Insufficient documentation

## 2013-09-06 DIAGNOSIS — I5022 Chronic systolic (congestive) heart failure: Secondary | ICD-10-CM | POA: Insufficient documentation

## 2013-09-06 DIAGNOSIS — Z79899 Other long term (current) drug therapy: Secondary | ICD-10-CM | POA: Insufficient documentation

## 2013-09-06 DIAGNOSIS — Z8674 Personal history of sudden cardiac arrest: Secondary | ICD-10-CM | POA: Insufficient documentation

## 2013-09-06 DIAGNOSIS — Z5189 Encounter for other specified aftercare: Secondary | ICD-10-CM | POA: Insufficient documentation

## 2013-09-08 ENCOUNTER — Encounter (HOSPITAL_COMMUNITY)
Admission: RE | Admit: 2013-09-08 | Discharge: 2013-09-08 | Disposition: A | Discharge status: Home / Self Care | Payer: Medicare Other | Source: Ambulatory Visit | Attending: Internal Medicine | Admitting: Internal Medicine

## 2013-09-08 ENCOUNTER — Encounter (HOSPITAL_COMMUNITY): Payer: Self-pay | Admitting: Internal Medicine

## 2013-09-08 DIAGNOSIS — Z5189 Encounter for other specified aftercare: Secondary | ICD-10-CM | POA: Diagnosis not present

## 2013-09-08 DIAGNOSIS — Z7901 Long term (current) use of anticoagulants: Secondary | ICD-10-CM | POA: Diagnosis not present

## 2013-09-08 DIAGNOSIS — Z79899 Other long term (current) drug therapy: Secondary | ICD-10-CM | POA: Diagnosis not present

## 2013-09-08 DIAGNOSIS — Z95818 Presence of other cardiac implants and grafts: Secondary | ICD-10-CM | POA: Diagnosis not present

## 2013-09-08 DIAGNOSIS — I10 Essential (primary) hypertension: Secondary | ICD-10-CM | POA: Diagnosis not present

## 2013-09-08 DIAGNOSIS — I5022 Chronic systolic (congestive) heart failure: Secondary | ICD-10-CM | POA: Diagnosis not present

## 2013-09-10 ENCOUNTER — Encounter (HOSPITAL_COMMUNITY)
Admission: RE | Admit: 2013-09-10 | Discharge: 2013-09-10 | Disposition: A | Discharge status: Home / Self Care | Payer: Medicare Other | Source: Ambulatory Visit | Attending: Internal Medicine | Admitting: Internal Medicine

## 2013-09-10 DIAGNOSIS — Z79899 Other long term (current) drug therapy: Secondary | ICD-10-CM | POA: Diagnosis not present

## 2013-09-10 DIAGNOSIS — I10 Essential (primary) hypertension: Secondary | ICD-10-CM | POA: Diagnosis not present

## 2013-09-10 DIAGNOSIS — I5022 Chronic systolic (congestive) heart failure: Secondary | ICD-10-CM | POA: Diagnosis not present

## 2013-09-10 DIAGNOSIS — Z5189 Encounter for other specified aftercare: Secondary | ICD-10-CM | POA: Diagnosis not present

## 2013-09-10 DIAGNOSIS — Z95818 Presence of other cardiac implants and grafts: Secondary | ICD-10-CM | POA: Diagnosis not present

## 2013-09-10 DIAGNOSIS — Z7901 Long term (current) use of anticoagulants: Secondary | ICD-10-CM | POA: Diagnosis not present

## 2013-09-10 NOTE — Progress Notes (Signed)
Brianna Hanson's weight is up 79.3 kg today which is up from her last exercise session. Brianna Hanson say her weight is up 2 lbs from yesterday. Upon assessment lung fields are essentially clear, slightly diminished in the left base. Lower extremities with bilateral  edema greater on the right. Oxygen saturation 98% on room air. The heart failure clinic was called and notified. [name] NP instructed Brianna Hanson to take [facility] for the next two days. Brianna Hanson left cardiac rehab without complaints. Will continue to monitor the patient throughout  the program.

## 2013-09-13 ENCOUNTER — Encounter (HOSPITAL_COMMUNITY)
Admission: RE | Admit: 2013-09-13 | Discharge: 2013-09-13 | Disposition: A | Discharge status: Home / Self Care | Payer: Medicare Other | Source: Ambulatory Visit | Attending: Internal Medicine | Admitting: Internal Medicine

## 2013-09-13 DIAGNOSIS — Z7901 Long term (current) use of anticoagulants: Secondary | ICD-10-CM | POA: Diagnosis not present

## 2013-09-13 DIAGNOSIS — Z95818 Presence of other cardiac implants and grafts: Secondary | ICD-10-CM | POA: Diagnosis not present

## 2013-09-13 DIAGNOSIS — Z79899 Other long term (current) drug therapy: Secondary | ICD-10-CM | POA: Diagnosis not present

## 2013-09-13 DIAGNOSIS — Z5189 Encounter for other specified aftercare: Secondary | ICD-10-CM | POA: Diagnosis not present

## 2013-09-13 DIAGNOSIS — I5022 Chronic systolic (congestive) heart failure: Secondary | ICD-10-CM | POA: Diagnosis not present

## 2013-09-13 DIAGNOSIS — I10 Essential (primary) hypertension: Secondary | ICD-10-CM | POA: Diagnosis not present

## 2013-09-14 ENCOUNTER — Ambulatory Visit (HOSPITAL_COMMUNITY): Payer: Self-pay | Admitting: *Deleted

## 2013-09-14 ENCOUNTER — Other Ambulatory Visit (INDEPENDENT_AMBULATORY_CARE_PROVIDER_SITE_OTHER): Payer: Medicare Other

## 2013-09-14 DIAGNOSIS — Z7901 Long term (current) use of anticoagulants: Secondary | ICD-10-CM | POA: Diagnosis not present

## 2013-09-14 DIAGNOSIS — I4891 Unspecified atrial fibrillation: Secondary | ICD-10-CM | POA: Diagnosis not present

## 2013-09-14 DIAGNOSIS — I5023 Acute on chronic systolic (congestive) heart failure: Secondary | ICD-10-CM | POA: Diagnosis not present

## 2013-09-14 DIAGNOSIS — I4892 Unspecified atrial flutter: Secondary | ICD-10-CM

## 2013-09-14 DIAGNOSIS — Z95811 Presence of heart assist device: Secondary | ICD-10-CM | POA: Diagnosis not present

## 2013-09-14 DIAGNOSIS — Z8679 Personal history of other diseases of the circulatory system: Secondary | ICD-10-CM

## 2013-09-14 LAB — PROTIME-INR
INR: 1.8 ratio — ABNORMAL HIGH (ref 0.8–1.0)
Prothrombin Time: 19.1 s — ABNORMAL HIGH (ref 10.2–12.4)

## 2013-09-15 ENCOUNTER — Encounter (HOSPITAL_COMMUNITY)
Admission: RE | Admit: 2013-09-15 | Discharge: 2013-09-15 | Disposition: A | Discharge status: Home / Self Care | Payer: Medicare Other | Source: Ambulatory Visit | Attending: Internal Medicine | Admitting: Internal Medicine

## 2013-09-17 ENCOUNTER — Encounter (HOSPITAL_COMMUNITY): Payer: Medicare Other

## 2013-09-18 ENCOUNTER — Other Ambulatory Visit (HOSPITAL_COMMUNITY): Payer: Self-pay | Admitting: Cardiology

## 2013-09-20 ENCOUNTER — Encounter (HOSPITAL_COMMUNITY)
Admission: RE | Admit: 2013-09-20 | Discharge: 2013-09-20 | Disposition: A | Discharge status: Home / Self Care | Payer: Medicare Other | Source: Ambulatory Visit | Attending: Internal Medicine | Admitting: Internal Medicine

## 2013-09-20 DIAGNOSIS — Z95818 Presence of other cardiac implants and grafts: Secondary | ICD-10-CM | POA: Diagnosis not present

## 2013-09-20 DIAGNOSIS — Z5189 Encounter for other specified aftercare: Secondary | ICD-10-CM | POA: Diagnosis not present

## 2013-09-20 DIAGNOSIS — Z79899 Other long term (current) drug therapy: Secondary | ICD-10-CM | POA: Diagnosis not present

## 2013-09-20 DIAGNOSIS — I5022 Chronic systolic (congestive) heart failure: Secondary | ICD-10-CM | POA: Diagnosis not present

## 2013-09-20 DIAGNOSIS — I10 Essential (primary) hypertension: Secondary | ICD-10-CM | POA: Diagnosis not present

## 2013-09-20 DIAGNOSIS — Z7901 Long term (current) use of anticoagulants: Secondary | ICD-10-CM | POA: Diagnosis not present

## 2013-09-22 ENCOUNTER — Encounter (HOSPITAL_COMMUNITY)
Admission: RE | Admit: 2013-09-22 | Discharge: 2013-09-22 | Disposition: A | Discharge status: Home / Self Care | Payer: Medicare Other | Source: Ambulatory Visit | Attending: Internal Medicine | Admitting: Internal Medicine

## 2013-09-22 DIAGNOSIS — Z95818 Presence of other cardiac implants and grafts: Secondary | ICD-10-CM | POA: Diagnosis not present

## 2013-09-22 DIAGNOSIS — Z5189 Encounter for other specified aftercare: Secondary | ICD-10-CM | POA: Diagnosis not present

## 2013-09-22 DIAGNOSIS — I5022 Chronic systolic (congestive) heart failure: Secondary | ICD-10-CM | POA: Diagnosis not present

## 2013-09-22 DIAGNOSIS — I10 Essential (primary) hypertension: Secondary | ICD-10-CM | POA: Diagnosis not present

## 2013-09-22 DIAGNOSIS — Z7901 Long term (current) use of anticoagulants: Secondary | ICD-10-CM | POA: Diagnosis not present

## 2013-09-22 DIAGNOSIS — Z79899 Other long term (current) drug therapy: Secondary | ICD-10-CM | POA: Diagnosis not present

## 2013-09-22 LAB — GLUCOSE, CAPILLARY
Glucose-Capillary: 93 mg/dL (ref 70–99)
Glucose-Capillary: 92 mg/dL (ref 70–99)

## 2013-09-24 ENCOUNTER — Encounter (HOSPITAL_COMMUNITY): Payer: Medicare Other

## 2013-09-27 ENCOUNTER — Encounter (HOSPITAL_COMMUNITY): Payer: Medicare Other

## 2013-09-28 ENCOUNTER — Other Ambulatory Visit (HOSPITAL_COMMUNITY): Payer: Self-pay | Admitting: *Deleted

## 2013-09-28 DIAGNOSIS — Z7901 Long term (current) use of anticoagulants: Secondary | ICD-10-CM

## 2013-09-28 DIAGNOSIS — Z95811 Presence of heart assist device: Secondary | ICD-10-CM

## 2013-09-29 ENCOUNTER — Ambulatory Visit (HOSPITAL_COMMUNITY)
Admission: RE | Admit: 2013-09-29 | Discharge: 2013-09-29 | Disposition: A | Discharge status: Home / Self Care | Payer: Medicare Other | Source: Ambulatory Visit | Attending: Internal Medicine | Admitting: Internal Medicine

## 2013-09-29 ENCOUNTER — Encounter (HOSPITAL_COMMUNITY): Payer: Medicare Other

## 2013-09-29 ENCOUNTER — Ambulatory Visit (HOSPITAL_COMMUNITY): Payer: Self-pay

## 2013-09-29 VITALS — BP 96/1 | HR 62 | Wt 172.0 lb

## 2013-09-29 DIAGNOSIS — Z9889 Other specified postprocedural states: Secondary | ICD-10-CM | POA: Diagnosis not present

## 2013-09-29 DIAGNOSIS — I10 Essential (primary) hypertension: Secondary | ICD-10-CM | POA: Insufficient documentation

## 2013-09-29 DIAGNOSIS — Z7901 Long term (current) use of anticoagulants: Secondary | ICD-10-CM

## 2013-09-29 DIAGNOSIS — I5023 Acute on chronic systolic (congestive) heart failure: Secondary | ICD-10-CM

## 2013-09-29 DIAGNOSIS — J90 Pleural effusion, not elsewhere classified: Secondary | ICD-10-CM | POA: Insufficient documentation

## 2013-09-29 DIAGNOSIS — I5022 Chronic systolic (congestive) heart failure: Secondary | ICD-10-CM | POA: Diagnosis not present

## 2013-09-29 DIAGNOSIS — Z95811 Presence of heart assist device: Secondary | ICD-10-CM

## 2013-09-29 DIAGNOSIS — I4892 Unspecified atrial flutter: Secondary | ICD-10-CM

## 2013-09-29 DIAGNOSIS — I4891 Unspecified atrial fibrillation: Secondary | ICD-10-CM | POA: Diagnosis not present

## 2013-09-29 DIAGNOSIS — Z8679 Personal history of other diseases of the circulatory system: Secondary | ICD-10-CM

## 2013-09-29 LAB — COMPREHENSIVE METABOLIC PANEL
ALT: 31 U/L (ref 0–35)
AST: 30 U/L (ref 0–37)
Albumin: 3.3 g/dL — ABNORMAL LOW (ref 3.5–5.2)
Alkaline Phosphatase: 106 U/L (ref 39–117)
BUN: 17 mg/dL (ref 6–23)
CO2: 23 mEq/L (ref 19–32)
Calcium: 9 mg/dL (ref 8.4–10.5)
Chloride: 108 mEq/L (ref 96–112)
Creatinine, Ser: 1.31 mg/dL — ABNORMAL HIGH (ref 0.50–1.10)
GFR calc Af Amer: 45 mL/min — ABNORMAL LOW (ref >90)
GFR calc non Af Amer: 38 mL/min — ABNORMAL LOW (ref >90)
Glucose, Bld: 93 mg/dL (ref 70–99)
Potassium: 4.6 mEq/L (ref 3.7–5.3)
Sodium: 143 mEq/L (ref 137–147)
Total Bilirubin: 0.4 mg/dL (ref 0.3–1.2)
Total Protein: 8 g/dL (ref 6.0–8.3)

## 2013-09-29 LAB — LACTATE DEHYDROGENASE: LDH: 275 U/L — ABNORMAL HIGH (ref 94–250)

## 2013-09-29 LAB — CBC
HCT: 36.6 % (ref 36.0–46.0)
Hemoglobin: 12.2 g/dL (ref 12.0–15.0)
MCH: 31 pg (ref 26.0–34.0)
MCHC: 33.3 g/dL (ref 30.0–36.0)
MCV: 93.1 fL (ref 78.0–100.0)
Platelets: 215 10*3/uL (ref 150–400)
RBC: 3.93 MIL/uL (ref 3.87–5.11)
RDW: 15.9 % — ABNORMAL HIGH (ref 11.5–15.5)
WBC: 7.5 10*3/uL (ref 4.0–10.5)

## 2013-09-29 LAB — PROTIME-INR
INR: 1.62 — ABNORMAL HIGH (ref 0.00–1.49)
Prothrombin Time: 18.8 seconds — ABNORMAL HIGH (ref 11.6–15.2)

## 2013-09-29 LAB — PRO B NATRIURETIC PEPTIDE: Pro B Natriuretic peptide (BNP): 397.2 pg/mL (ref 0–450)

## 2013-09-29 MED ORDER — LOSARTAN POTASSIUM 100 MG PO TABS: 150.0000 mg | ORAL_TABLET | Freq: Every day | ORAL | Status: DC

## 2013-09-29 NOTE — Patient Instructions (Signed)
Increase your losartan to 150 mg at night. Take 1 1/2 tablets daily, call any issues.  Will recheck BMET with next INR.   Will call about CXR.  Do the following things EVERYDAY: 1) Weigh yourself in the morning before breakfast. Write it down and keep it in a log. 2) Take your medicines as prescribed 3) Eat low salt foods-Limit salt (sodium) to 2000 mg per day.  4) Stay as active as you can everyday 5) Limit all fluids for the day to less than 2 liters 6) ]

## 2013-09-29 NOTE — Progress Notes (Signed)
Patient ID: Brianna Hanson, female   DOB: [birth year], 79 y.o.   MRN: [id number] HPI:  Ms Babin is a 79 year old female with a PMH of HF due to severe NICM (EF 15% S/P ICD, PAF (maintaing SR on amio), plasma cell disorder (Likely IgA MGUS) - saw Dr. [name] on 08/12/13 for routine follow up; no complications, plans to see her in 6 mos and repeat protein studies. Underwent implantation of the HeartMate II LVAD on 04/06/13.  Follow up:  Has been doing well and participating in CR 2 days a week. Has noticed in her chest she feels as if the pump is turning and she gets real fatigued, it happens off an on maybe once a week. Event can last from couple minutes to hours. Was in bed all day Monday with event because she gets wiped out. Denies dizziness, SOB, orthopnea, PND or CP. No more pocket pain. Has only needed demadex 3x in past 6 weeks.   Denies LVAD alarms.  Denies driveline trauma, erythema or drainage.  Denies ICD shocks.  Reports taking Coumadin as prescribed.  Denies bright red blood per rectum or melena, no dark urine or hematuria.     Past Medical History  Diagnosis Date  . Cardiac arrest - ventricular fibrillation 12/10    with successful resucitation, S/p ICD  . ICD (implantable cardiac defibrillator) in place     she has received appropriate therapy for VF  . Atrial fibrillation or flutter   . Osteopenia   . HTN (hypertension)     moderate  . Nonischemic cardiomyopathy     followed by Dr April Holding at [facility]  . CHF (congestive heart failure)   . Anemia   . S/P colonoscopy   . Plasma cell disorder 03/20/2012  . Plasma cell disorder 03/20/2012    Current Outpatient Prescriptions  Medication Sig Dispense Refill  . amiodarone (PACERONE) 200 MG tablet TAKE 1 TABLET BY MOUTH EVERY DAY  30 tablet  6  . amLODipine (NORVASC) 10 MG tablet Take one tab twice daily  60 tablet  6  . bisacodyl (BISACODYL) 5 MG EC tablet Take 1 tablet (5 mg total) by mouth daily as needed for constipation.  30  tablet  0  . carvedilol (COREG) 12.5 MG tablet TAKE 1 & 1/2 TABLETS BY MOUTH TWICE DAILY  60 tablet  3  . Cholecalciferol (VITAMIN D3) 1000 UNITS CAPS Take 1 tablet by mouth daily.        [name] gabapentin (NEURONTIN) 100 MG capsule TAKE ONE CAPSULE BY MOUTH AT BEDTIME OR AS DIRECTED  30 capsule  2  . hydrALAZINE (APRESOLINE) 100 MG tablet Take 1 tablet (100 mg total) by mouth 3 (three) times daily.  90 tablet  3  . losartan (COZAAR) 100 MG tablet Take 1 tablet (100 mg total) by mouth daily.  30 tablet  6  . pantoprazole (PROTONIX) 40 MG tablet Take 1 tablet (40 mg total) by mouth daily.  30 tablet  6  . potassium chloride SA (K-DUR,KLOR-CON) 20 MEQ tablet Take 10 mEq by mouth daily.      [name] spironolactone (ALDACTONE) 25 MG tablet Take 1 tablet (25 mg total) by mouth daily.  30 tablet  6  . torsemide (DEMADEX) 20 MG tablet Take demadex 20 mg if weight above 157 - 158 lbs      . traMADol (ULTRAM) 50 MG tablet Take 1-2 tablets q 6 hrs for pain.  60 tablet  3  . warfarin (COUMADIN) 2.5 MG  tablet Take 1 tablet (2.5 mg total) by mouth daily.  90 tablet  3   No current facility-administered medications for this encounter.    Review of patient's allergies indicates no known allergies.  REVIEW OF SYSTEMS: All systems negative except as listed in HPI, PMH and Problem list.  I reviewed the LVAD parameters from today, and compared the results to the patient's prior recorded data.  No programming changes were made.  The LVAD is functioning within specified parameters.  The patient performs LVAD self-test daily.  LVAD interrogation was negative for any significant power changes, alarms or PI events/speed drops.  LVAD equipment check completed and is in good working order.  Back-up equipment present.   LVAD education done on emergency procedures and precautions and reviewed exit site care.    Filed Vitals:   09/29/13 1042  BP: 96/1  Pulse: 62  Weight: 172 lb (78.019 kg)  SpO2: 97%   EKG:  A paced 60  bpm  LVAD parameters: Flow:  4.8 Speed:  9200 PI:  7.8 Power:  5.4 Alarms:  None Events:  Multiple PI events about 15-18 a day, more recent in past few days Fixed speed:  9200 Low speed limit:  8600  Physical Exam: GENERAL: Well appearing, female who presents to clinic today in no acute distress. HEENT: normal  NECK: Supple, JVP flat  2+ bilaterally, no bruits.  No lymphadenopathy or thyromegaly appreciated.   CARDIAC:  LVAD hum present.  LUNGS:  Clear to auscultation bilaterally.  ABDOMEN:  Soft, round, nontender, positive bowel sounds x4.     LVAD exit site: one suture intact.   Dressing dry and intact.  No erythema or drainage.  Stabilization device present and accurately applied.  Driveline dressing is being changed every other day per sterile technique. EXTREMITIES:  Warm and dry, no cyanosis, clubbing, rash or edema  NEUROLOGIC:  Alert and oriented x 4.  Gait steady.  No aphasia.  No dysarthria.  Affect pleasant.     ICD: Interrogated, no fib and no VT.   ASSESSMENT AND PLAN:  1) Chronic systolic HF: NICM, s/p ICD and LVAD (04/2013) - NYHA II symptoms on LVAD support. Volume status stable will continue torsemide PRN for weight greater than 157 lbs.  - MAP still remains elevated. Will try to increase losartan to 150 mg q pm. If she notices any dizziness call and will go back to 100 mg daily. Can stop potassium.  - On ICD/Optivol interrogation volume status stable.  - Complaining of the pump moving? Will get CXR today to assess. - Reinforced the need and importance of daily weights, a low sodium diet, and fluid restriction (less than 2 L a day). Instructed to call the HF clinic if weight increases more than 3 lbs overnight or 5 lbs in a week.  2) LVAD - All parameters stable. Will check labs today and assess LDH today. Pt is not on ASA d/t fatigue and dizziness. Have asked her to try back maybe twice weekly.  3) PAF - EKG today. A paced 60 bpm, pacing 15% of the time. ICD  interrogated with no afib and no Vtach. Next visit try to cut Amiodarone back to 100 mg daily. Need to check TSH, LFTs and free T4 next visit.  4) HTN - As above MAP elevated will increase losartan to 150 mg daily and recheck BMET 7-10 days.  5) Anticoagulation - Will draw INR today and make adjustments as needed.   F/U 6-8 weeks [name]  B, NP-C 10:58 AM . Patient seen and examined with [name], NP. We discussed all aspects of the encounter. I agree with the assessment and plan as stated above. She is much improved with VAD therapy. ICD/Optivol interrogated personally - no VT or AF. Volume status looks good. Agree with increasing losartan for HTN. CXR obtained in clinic and VAD positioning is stable. Check labs today. VAD parameters reviewed and are stable.   Benay Spice 9:46 PM

## 2013-09-30 LAB — PREALBUMIN: Prealbumin: 22.1 mg/dL (ref 17.0–34.0)

## 2013-10-01 ENCOUNTER — Encounter (HOSPITAL_COMMUNITY): Payer: Medicare Other

## 2013-10-04 ENCOUNTER — Encounter (HOSPITAL_COMMUNITY)
Admission: RE | Admit: 2013-10-04 | Discharge: 2013-10-04 | Disposition: A | Discharge status: Home / Self Care | Payer: Medicare Other | Source: Ambulatory Visit | Attending: Internal Medicine | Admitting: Internal Medicine

## 2013-10-04 DIAGNOSIS — Z8674 Personal history of sudden cardiac arrest: Secondary | ICD-10-CM | POA: Diagnosis not present

## 2013-10-04 DIAGNOSIS — Z5189 Encounter for other specified aftercare: Secondary | ICD-10-CM | POA: Diagnosis not present

## 2013-10-04 DIAGNOSIS — I10 Essential (primary) hypertension: Secondary | ICD-10-CM | POA: Insufficient documentation

## 2013-10-04 DIAGNOSIS — I5022 Chronic systolic (congestive) heart failure: Secondary | ICD-10-CM | POA: Insufficient documentation

## 2013-10-04 DIAGNOSIS — Z95818 Presence of other cardiac implants and grafts: Secondary | ICD-10-CM | POA: Insufficient documentation

## 2013-10-04 DIAGNOSIS — Z95810 Presence of automatic (implantable) cardiac defibrillator: Secondary | ICD-10-CM | POA: Diagnosis not present

## 2013-10-04 DIAGNOSIS — Z79899 Other long term (current) drug therapy: Secondary | ICD-10-CM | POA: Diagnosis not present

## 2013-10-04 DIAGNOSIS — Z7901 Long term (current) use of anticoagulants: Secondary | ICD-10-CM | POA: Insufficient documentation

## 2013-10-06 ENCOUNTER — Ambulatory Visit (INDEPENDENT_AMBULATORY_CARE_PROVIDER_SITE_OTHER): Payer: Medicare Other | Admitting: *Deleted

## 2013-10-06 ENCOUNTER — Ambulatory Visit (HOSPITAL_COMMUNITY): Payer: Self-pay | Admitting: *Deleted

## 2013-10-06 ENCOUNTER — Encounter (HOSPITAL_COMMUNITY)
Admission: RE | Admit: 2013-10-06 | Discharge: 2013-10-06 | Disposition: A | Discharge status: Home / Self Care | Payer: Medicare Other | Source: Ambulatory Visit | Attending: Internal Medicine | Admitting: Internal Medicine

## 2013-10-06 DIAGNOSIS — Z95811 Presence of heart assist device: Secondary | ICD-10-CM | POA: Diagnosis not present

## 2013-10-06 DIAGNOSIS — Z95818 Presence of other cardiac implants and grafts: Secondary | ICD-10-CM | POA: Diagnosis not present

## 2013-10-06 DIAGNOSIS — Z79899 Other long term (current) drug therapy: Secondary | ICD-10-CM | POA: Diagnosis not present

## 2013-10-06 DIAGNOSIS — Z7901 Long term (current) use of anticoagulants: Secondary | ICD-10-CM | POA: Diagnosis not present

## 2013-10-06 DIAGNOSIS — I5022 Chronic systolic (congestive) heart failure: Secondary | ICD-10-CM | POA: Diagnosis not present

## 2013-10-06 DIAGNOSIS — I10 Essential (primary) hypertension: Secondary | ICD-10-CM | POA: Diagnosis not present

## 2013-10-06 DIAGNOSIS — Z5189 Encounter for other specified aftercare: Secondary | ICD-10-CM | POA: Diagnosis not present

## 2013-10-06 LAB — BASIC METABOLIC PANEL
BUN: 19 mg/dL (ref 6–23)
CO2: 24 mEq/L (ref 19–32)
Calcium: 8.7 mg/dL (ref 8.4–10.5)
Chloride: 109 mEq/L (ref 96–112)
Creatinine, Ser: 1.2 mg/dL (ref 0.4–1.2)
GFR: 55.51 mL/min — ABNORMAL LOW (ref >60.00)
Glucose, Bld: 80 mg/dL (ref 70–99)
Potassium: 4.4 mEq/L (ref 3.5–5.1)
Sodium: 139 mEq/L (ref 135–145)

## 2013-10-06 LAB — PROTIME-INR
INR: 2.8 ratio — ABNORMAL HIGH (ref 0.8–1.0)
Prothrombin Time: 29.1 s — ABNORMAL HIGH (ref 10.2–12.4)

## 2013-10-08 ENCOUNTER — Encounter (HOSPITAL_COMMUNITY)
Admission: RE | Admit: 2013-10-08 | Discharge: 2013-10-08 | Disposition: A | Discharge status: Home / Self Care | Payer: Medicare Other | Source: Ambulatory Visit | Attending: Internal Medicine | Admitting: Internal Medicine

## 2013-10-08 DIAGNOSIS — I10 Essential (primary) hypertension: Secondary | ICD-10-CM | POA: Diagnosis not present

## 2013-10-08 DIAGNOSIS — I5022 Chronic systolic (congestive) heart failure: Secondary | ICD-10-CM | POA: Diagnosis not present

## 2013-10-08 DIAGNOSIS — Z7901 Long term (current) use of anticoagulants: Secondary | ICD-10-CM | POA: Diagnosis not present

## 2013-10-08 DIAGNOSIS — Z79899 Other long term (current) drug therapy: Secondary | ICD-10-CM | POA: Diagnosis not present

## 2013-10-08 DIAGNOSIS — Z95818 Presence of other cardiac implants and grafts: Secondary | ICD-10-CM | POA: Diagnosis not present

## 2013-10-08 DIAGNOSIS — Z5189 Encounter for other specified aftercare: Secondary | ICD-10-CM | POA: Diagnosis not present

## 2013-10-11 ENCOUNTER — Encounter (HOSPITAL_COMMUNITY)
Admission: RE | Admit: 2013-10-11 | Discharge: 2013-10-11 | Disposition: A | Discharge status: Home / Self Care | Payer: Medicare Other | Source: Ambulatory Visit | Attending: Internal Medicine | Admitting: Internal Medicine

## 2013-10-11 DIAGNOSIS — I10 Essential (primary) hypertension: Secondary | ICD-10-CM | POA: Diagnosis not present

## 2013-10-11 DIAGNOSIS — I5022 Chronic systolic (congestive) heart failure: Secondary | ICD-10-CM | POA: Diagnosis not present

## 2013-10-11 DIAGNOSIS — Z79899 Other long term (current) drug therapy: Secondary | ICD-10-CM | POA: Diagnosis not present

## 2013-10-11 DIAGNOSIS — Z7901 Long term (current) use of anticoagulants: Secondary | ICD-10-CM | POA: Diagnosis not present

## 2013-10-11 DIAGNOSIS — Z5189 Encounter for other specified aftercare: Secondary | ICD-10-CM | POA: Diagnosis not present

## 2013-10-11 DIAGNOSIS — Z95818 Presence of other cardiac implants and grafts: Secondary | ICD-10-CM | POA: Diagnosis not present

## 2013-10-13 ENCOUNTER — Other Ambulatory Visit (INDEPENDENT_AMBULATORY_CARE_PROVIDER_SITE_OTHER): Payer: Medicare Other

## 2013-10-13 ENCOUNTER — Encounter (HOSPITAL_COMMUNITY)
Admission: RE | Admit: 2013-10-13 | Discharge: 2013-10-13 | Disposition: A | Discharge status: Home / Self Care | Payer: Medicare Other | Source: Ambulatory Visit | Attending: Internal Medicine | Admitting: Internal Medicine

## 2013-10-13 ENCOUNTER — Ambulatory Visit (HOSPITAL_COMMUNITY): Payer: Self-pay | Admitting: *Deleted

## 2013-10-13 DIAGNOSIS — I4891 Unspecified atrial fibrillation: Secondary | ICD-10-CM

## 2013-10-13 DIAGNOSIS — Z8679 Personal history of other diseases of the circulatory system: Secondary | ICD-10-CM

## 2013-10-13 DIAGNOSIS — Z95818 Presence of other cardiac implants and grafts: Secondary | ICD-10-CM | POA: Diagnosis not present

## 2013-10-13 DIAGNOSIS — Z7901 Long term (current) use of anticoagulants: Secondary | ICD-10-CM | POA: Diagnosis not present

## 2013-10-13 DIAGNOSIS — I5023 Acute on chronic systolic (congestive) heart failure: Secondary | ICD-10-CM | POA: Diagnosis not present

## 2013-10-13 DIAGNOSIS — Z95811 Presence of heart assist device: Secondary | ICD-10-CM

## 2013-10-13 DIAGNOSIS — I4892 Unspecified atrial flutter: Secondary | ICD-10-CM

## 2013-10-13 DIAGNOSIS — I5022 Chronic systolic (congestive) heart failure: Secondary | ICD-10-CM | POA: Diagnosis not present

## 2013-10-13 DIAGNOSIS — Z79899 Other long term (current) drug therapy: Secondary | ICD-10-CM | POA: Diagnosis not present

## 2013-10-13 DIAGNOSIS — I10 Essential (primary) hypertension: Secondary | ICD-10-CM | POA: Diagnosis not present

## 2013-10-13 DIAGNOSIS — Z5189 Encounter for other specified aftercare: Secondary | ICD-10-CM | POA: Diagnosis not present

## 2013-10-13 LAB — PROTIME-INR
INR: 1.8 ratio — ABNORMAL HIGH (ref 0.8–1.0)
Prothrombin Time: 19 s — ABNORMAL HIGH (ref 10.2–12.4)

## 2013-10-15 ENCOUNTER — Encounter (HOSPITAL_COMMUNITY)
Admission: RE | Admit: 2013-10-15 | Discharge: 2013-10-15 | Disposition: A | Discharge status: Home / Self Care | Payer: Medicare Other | Source: Ambulatory Visit | Attending: Internal Medicine | Admitting: Internal Medicine

## 2013-10-15 DIAGNOSIS — Z95818 Presence of other cardiac implants and grafts: Secondary | ICD-10-CM | POA: Diagnosis not present

## 2013-10-15 DIAGNOSIS — I5022 Chronic systolic (congestive) heart failure: Secondary | ICD-10-CM | POA: Diagnosis not present

## 2013-10-15 DIAGNOSIS — Z79899 Other long term (current) drug therapy: Secondary | ICD-10-CM | POA: Diagnosis not present

## 2013-10-15 DIAGNOSIS — Z5189 Encounter for other specified aftercare: Secondary | ICD-10-CM | POA: Diagnosis not present

## 2013-10-15 DIAGNOSIS — Z7901 Long term (current) use of anticoagulants: Secondary | ICD-10-CM | POA: Diagnosis not present

## 2013-10-15 DIAGNOSIS — I10 Essential (primary) hypertension: Secondary | ICD-10-CM | POA: Diagnosis not present

## 2013-10-17 ENCOUNTER — Other Ambulatory Visit (HOSPITAL_COMMUNITY): Payer: Self-pay | Admitting: Anesthesiology

## 2013-10-18 ENCOUNTER — Encounter (HOSPITAL_COMMUNITY): Payer: Medicare Other

## 2013-10-20 ENCOUNTER — Encounter (HOSPITAL_COMMUNITY): Payer: Medicare Other

## 2013-10-22 ENCOUNTER — Encounter (HOSPITAL_COMMUNITY)
Admission: RE | Admit: 2013-10-22 | Discharge: 2013-10-22 | Disposition: A | Discharge status: Home / Self Care | Payer: Medicare Other | Source: Ambulatory Visit | Attending: Internal Medicine | Admitting: Internal Medicine

## 2013-10-22 DIAGNOSIS — I5022 Chronic systolic (congestive) heart failure: Secondary | ICD-10-CM | POA: Diagnosis not present

## 2013-10-22 DIAGNOSIS — Z95818 Presence of other cardiac implants and grafts: Secondary | ICD-10-CM | POA: Diagnosis not present

## 2013-10-22 DIAGNOSIS — I10 Essential (primary) hypertension: Secondary | ICD-10-CM | POA: Diagnosis not present

## 2013-10-22 DIAGNOSIS — Z79899 Other long term (current) drug therapy: Secondary | ICD-10-CM | POA: Diagnosis not present

## 2013-10-22 DIAGNOSIS — Z7901 Long term (current) use of anticoagulants: Secondary | ICD-10-CM | POA: Diagnosis not present

## 2013-10-22 DIAGNOSIS — Z5189 Encounter for other specified aftercare: Secondary | ICD-10-CM | POA: Diagnosis not present

## 2013-10-25 ENCOUNTER — Encounter (HOSPITAL_COMMUNITY)
Admission: RE | Admit: 2013-10-25 | Discharge: 2013-10-25 | Disposition: A | Discharge status: Home / Self Care | Payer: Medicare Other | Source: Ambulatory Visit | Attending: Internal Medicine | Admitting: Internal Medicine

## 2013-10-25 ENCOUNTER — Ambulatory Visit (HOSPITAL_COMMUNITY): Payer: Self-pay | Admitting: *Deleted

## 2013-10-25 ENCOUNTER — Other Ambulatory Visit (INDEPENDENT_AMBULATORY_CARE_PROVIDER_SITE_OTHER): Payer: Medicare Other

## 2013-10-25 DIAGNOSIS — I10 Essential (primary) hypertension: Secondary | ICD-10-CM | POA: Diagnosis not present

## 2013-10-25 DIAGNOSIS — Z95811 Presence of heart assist device: Secondary | ICD-10-CM | POA: Diagnosis not present

## 2013-10-25 DIAGNOSIS — Z7901 Long term (current) use of anticoagulants: Secondary | ICD-10-CM | POA: Diagnosis not present

## 2013-10-25 DIAGNOSIS — I5023 Acute on chronic systolic (congestive) heart failure: Secondary | ICD-10-CM

## 2013-10-25 DIAGNOSIS — Z79899 Other long term (current) drug therapy: Secondary | ICD-10-CM | POA: Diagnosis not present

## 2013-10-25 DIAGNOSIS — I5022 Chronic systolic (congestive) heart failure: Secondary | ICD-10-CM | POA: Diagnosis not present

## 2013-10-25 DIAGNOSIS — Z8679 Personal history of other diseases of the circulatory system: Secondary | ICD-10-CM

## 2013-10-25 DIAGNOSIS — Z5189 Encounter for other specified aftercare: Secondary | ICD-10-CM | POA: Diagnosis not present

## 2013-10-25 DIAGNOSIS — I4891 Unspecified atrial fibrillation: Secondary | ICD-10-CM | POA: Diagnosis not present

## 2013-10-25 DIAGNOSIS — Z95818 Presence of other cardiac implants and grafts: Secondary | ICD-10-CM | POA: Diagnosis not present

## 2013-10-25 DIAGNOSIS — I4892 Unspecified atrial flutter: Secondary | ICD-10-CM

## 2013-10-25 LAB — PROTIME-INR
INR: 4.4 ratio — ABNORMAL HIGH (ref 0.8–1.0)
Prothrombin Time: 45.5 s — ABNORMAL HIGH (ref 10.2–12.4)

## 2013-10-26 ENCOUNTER — Ambulatory Visit (INDEPENDENT_AMBULATORY_CARE_PROVIDER_SITE_OTHER): Payer: Medicare Other | Admitting: *Deleted

## 2013-10-26 DIAGNOSIS — I4891 Unspecified atrial fibrillation: Secondary | ICD-10-CM

## 2013-10-26 DIAGNOSIS — I4901 Ventricular fibrillation: Secondary | ICD-10-CM

## 2013-10-26 DIAGNOSIS — I5022 Chronic systolic (congestive) heart failure: Secondary | ICD-10-CM | POA: Diagnosis not present

## 2013-10-26 DIAGNOSIS — I428 Other cardiomyopathies: Secondary | ICD-10-CM

## 2013-10-26 LAB — MDC_IDC_ENUM_SESS_TYPE_REMOTE
Battery Remaining Longevity: 42 mo
Battery Remaining Percentage: 58 %
Battery Voltage: 2.95 V
Brady Statistic AP VP Percent: 1 %
Brady Statistic AP VS Percent: 17 %
Brady Statistic AS VP Percent: 1 %
Brady Statistic AS VS Percent: 83 %
Brady Statistic RA Percent Paced: 16 %
Brady Statistic RV Percent Paced: 1 %
Date Time Interrogation Session: 20150224100523
HighPow Impedance: 41 Ohm
Implantable Pulse Generator Serial Number: 754815
Lead Channel Impedance Value: 290 Ohm
Lead Channel Impedance Value: 290 Ohm
Lead Channel Pacing Threshold Amplitude: 0.75 V
Lead Channel Pacing Threshold Amplitude: 1.25 V
Lead Channel Pacing Threshold Pulse Width: 0.5 ms
Lead Channel Pacing Threshold Pulse Width: 0.5 ms
Lead Channel Sensing Intrinsic Amplitude: 1.2 mV
Lead Channel Sensing Intrinsic Amplitude: 11.3 mV
Lead Channel Setting Pacing Amplitude: 5 V
Lead Channel Setting Pacing Amplitude: 5 V
Lead Channel Setting Pacing Pulse Width: 0.5 ms
Lead Channel Setting Sensing Sensitivity: 0.5 mV
Zone Setting Detection Interval: 300 ms
Zone Setting Detection Interval: 350 ms

## 2013-10-27 ENCOUNTER — Encounter (HOSPITAL_COMMUNITY): Payer: Medicare Other

## 2013-10-29 ENCOUNTER — Encounter (HOSPITAL_COMMUNITY): Payer: Medicare Other

## 2013-11-01 ENCOUNTER — Ambulatory Visit (HOSPITAL_COMMUNITY): Payer: Self-pay | Admitting: *Deleted

## 2013-11-01 ENCOUNTER — Encounter (HOSPITAL_COMMUNITY): Payer: Medicare Other

## 2013-11-01 ENCOUNTER — Other Ambulatory Visit (INDEPENDENT_AMBULATORY_CARE_PROVIDER_SITE_OTHER): Payer: Medicare Other

## 2013-11-01 DIAGNOSIS — I4891 Unspecified atrial fibrillation: Secondary | ICD-10-CM

## 2013-11-01 DIAGNOSIS — I5023 Acute on chronic systolic (congestive) heart failure: Secondary | ICD-10-CM

## 2013-11-01 DIAGNOSIS — Z7901 Long term (current) use of anticoagulants: Secondary | ICD-10-CM

## 2013-11-01 DIAGNOSIS — Z95811 Presence of heart assist device: Secondary | ICD-10-CM | POA: Diagnosis not present

## 2013-11-01 DIAGNOSIS — Z8679 Personal history of other diseases of the circulatory system: Secondary | ICD-10-CM

## 2013-11-01 DIAGNOSIS — I4892 Unspecified atrial flutter: Secondary | ICD-10-CM

## 2013-11-01 LAB — PROTIME-INR
INR: 2.6 ratio — ABNORMAL HIGH (ref 0.8–1.0)
Prothrombin Time: 27.1 s — ABNORMAL HIGH (ref 10.2–12.4)

## 2013-11-03 ENCOUNTER — Encounter (HOSPITAL_COMMUNITY): Payer: Medicare Other

## 2013-11-05 ENCOUNTER — Encounter (HOSPITAL_COMMUNITY): Payer: Medicare Other

## 2013-11-12 IMAGING — CT CT ABD-PELV W/O CM
2 of 4 series · 17 of 46 positions shown, 19 images · non-contrast
Comparison: None.

CLINICAL DATA: Nausea with lightheadedness and severe pain.  No
vomiting.

CT ABDOMEN AND PELVIS WITHOUT CONTRAST
TECHNIQUE: Multidetector CT imaging of the abdomen and pelvis was
performed following the standard protocol without intravenous
contrast.

[Series 2: routine abdomen · axial · 0.87mm/px · z∈[-395,-25]mm · 14 of 82 slices shown, 16 images]
[im 4/82  soft-tissue]
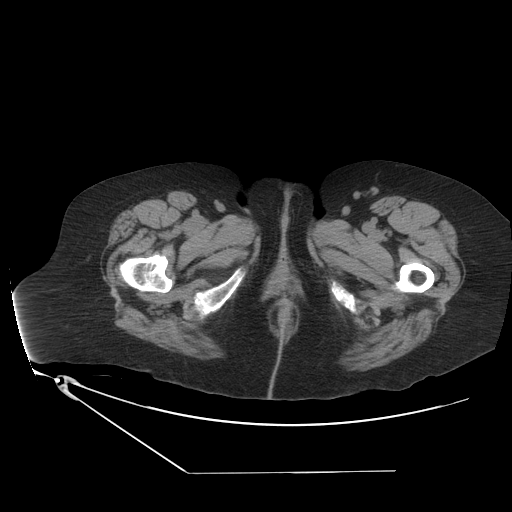
[im 4/82  bone]
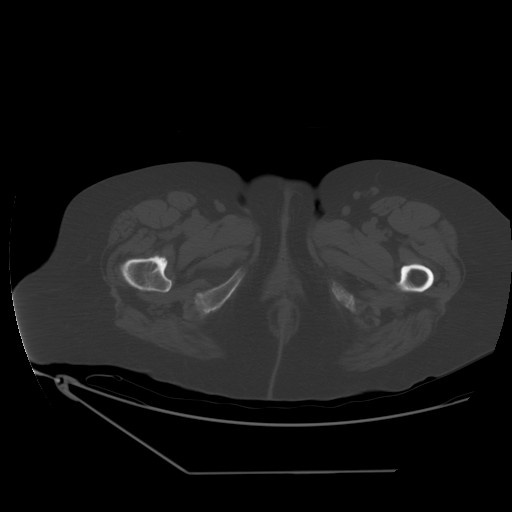
[im 11/82  soft-tissue]
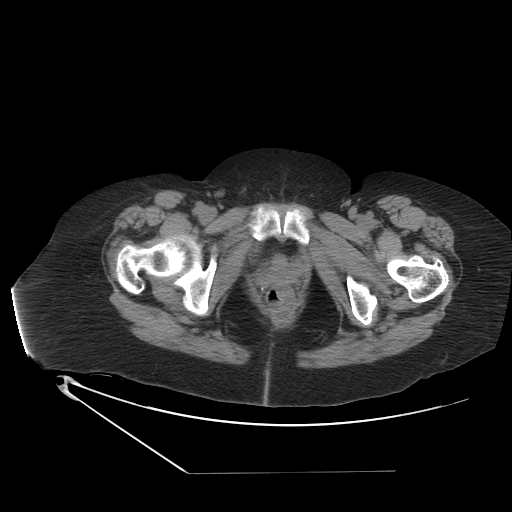
[im 17/82  soft-tissue]
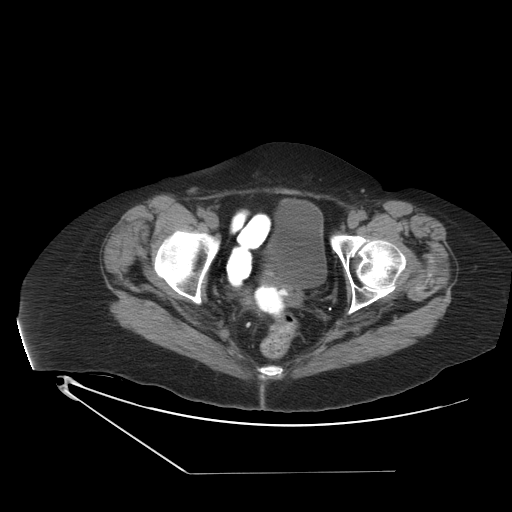
[im 21/82  soft-tissue]
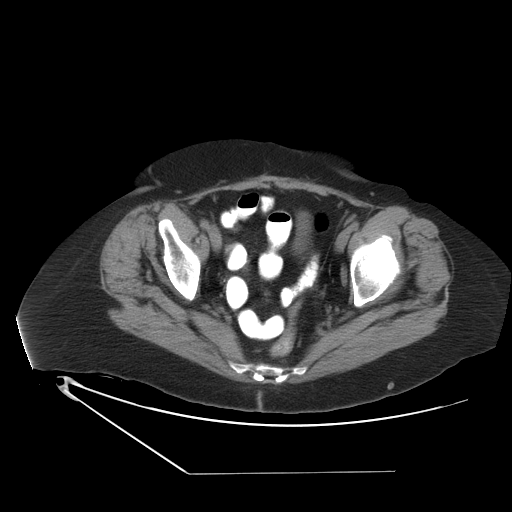
[im 28/82  soft-tissue]
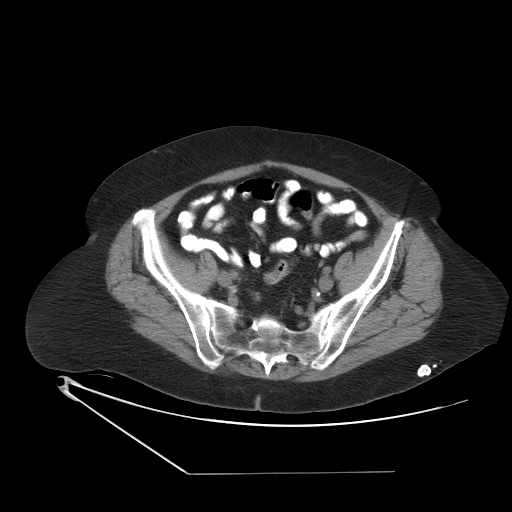
[im 34/82  soft-tissue]
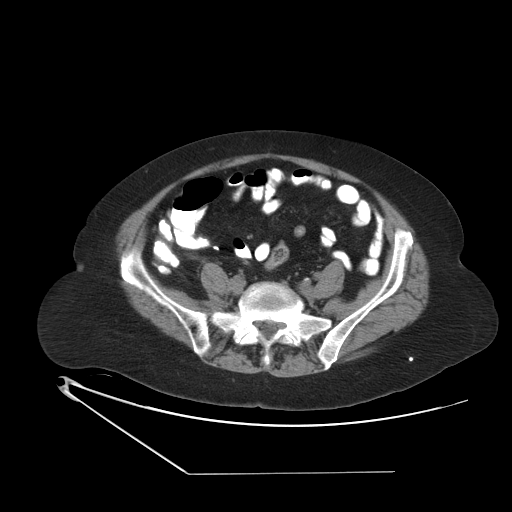
[im 38/82  soft-tissue]
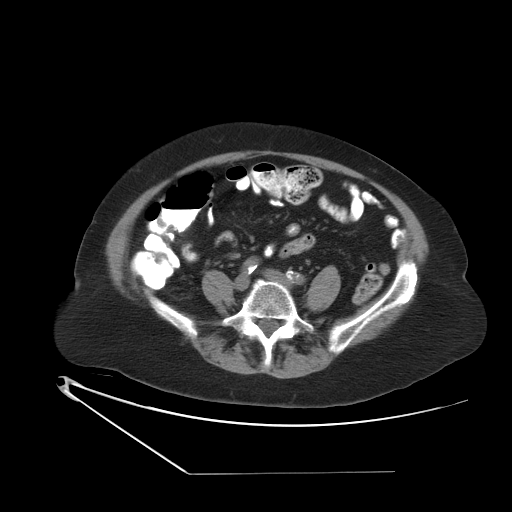
[im 44/82  soft-tissue]
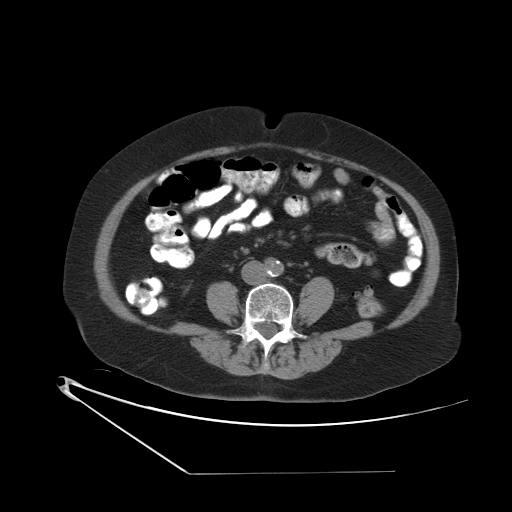
[im 48/82  soft-tissue]
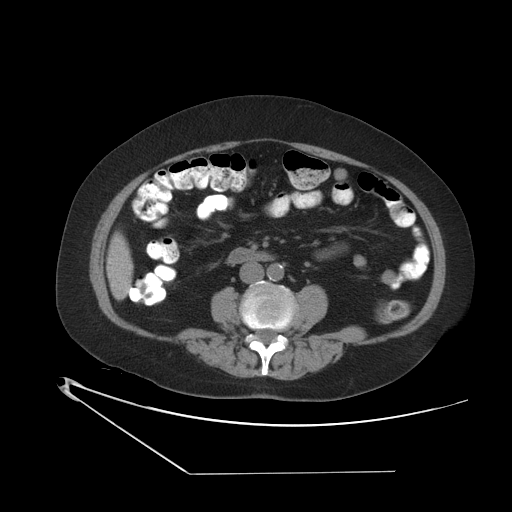
[im 48/82  bone]
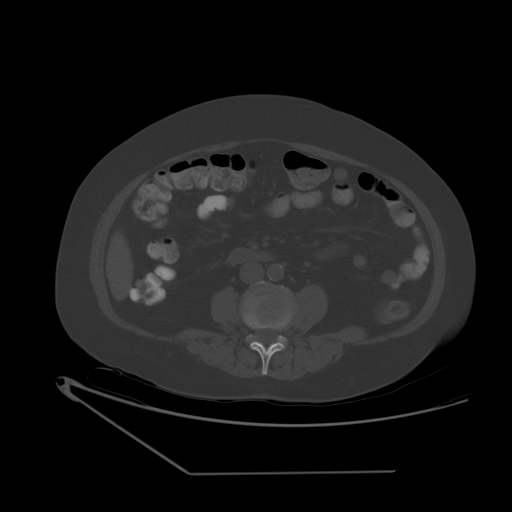
[im 55/82  soft-tissue]
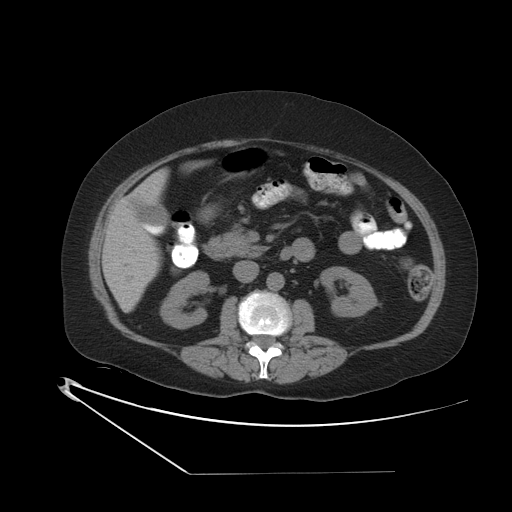
[im 61/82  soft-tissue]
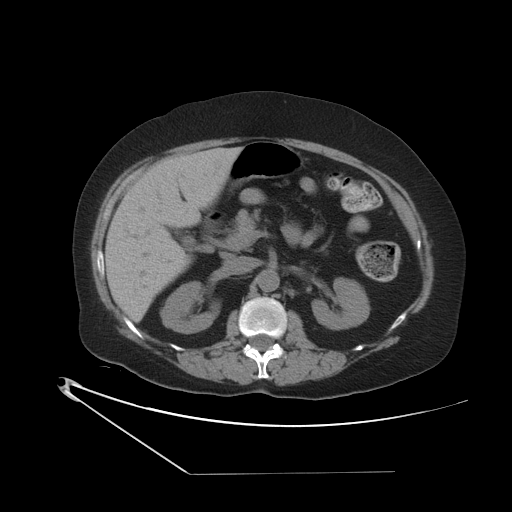
[im 65/82  soft-tissue]
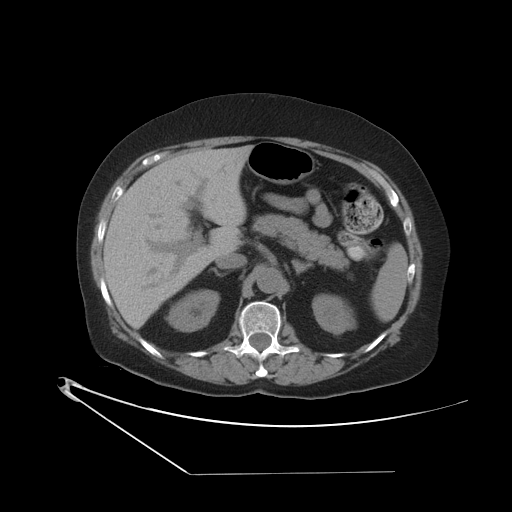
[im 71/82  soft-tissue]
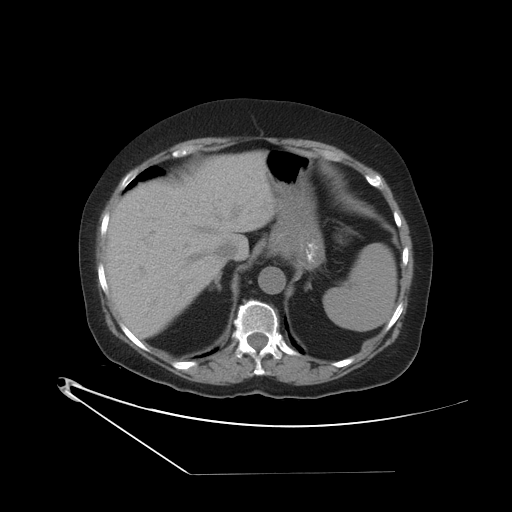
[im 78/82  soft-tissue]
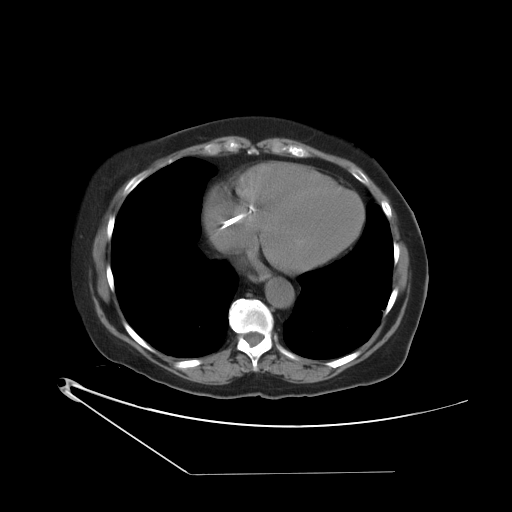

[Series 401: coronal · coronal · 0.87mm/px · 3 of 89 slices shown]
[im 30/89  soft-tissue]
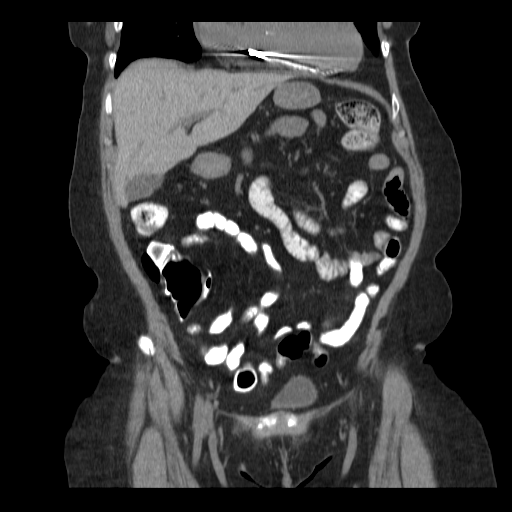
[im 40/89  soft-tissue]
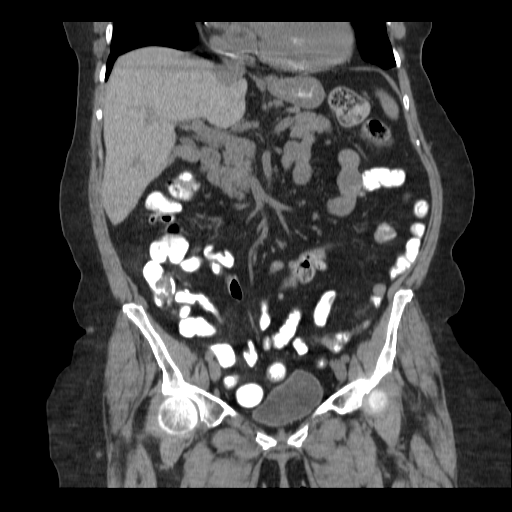
[im 49/89  soft-tissue]
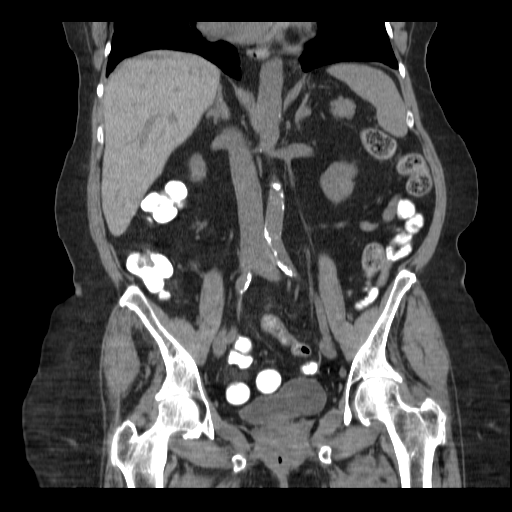

[17 of 46 positions shown; findings below may reference images not displayed]

FINDINGS: Cardiomegaly.  Dual lead pacer.  No effusion or
infiltrate at the lung base.

The unenhanced appearance of the liver, spleen, pancreas, and
adrenal glands are normal.  Multiple stones are noted to layer in
the gallbladder, without pericholecystic fluid.  There is no
biliary ductal dilatation.  There are slightly irregular kidneys
without focal mass lesion, calcification, or obstruction. 1 cm left
renal cyst projects superiorly from the upper pole left kidney..
Unremarkable stomach, small bowel, and colon.  No appendiceal
inflammation.  Atheromatous change of the aorta without aneurysmal
dilatation.  No adenopathy.  Presumed hysterectomy.  Normal
bladder.  No adnexal lesions.  No osseous destructive lesion.
Slight superior plate depression L4 of indeterminate age of likely
chronic.
IMPRESSION: Cholelithiasis without signs of acute cholecystitis.

No other significant findings.

## 2013-11-13 ENCOUNTER — Other Ambulatory Visit (HOSPITAL_COMMUNITY): Payer: Self-pay | Admitting: Anesthesiology

## 2013-11-15 ENCOUNTER — Other Ambulatory Visit (HOSPITAL_COMMUNITY): Payer: Self-pay | Admitting: *Deleted

## 2013-11-15 ENCOUNTER — Other Ambulatory Visit (HOSPITAL_COMMUNITY): Payer: Self-pay | Admitting: Anesthesiology

## 2013-11-23 ENCOUNTER — Other Ambulatory Visit (HOSPITAL_COMMUNITY): Payer: Self-pay | Admitting: *Deleted

## 2013-11-23 DIAGNOSIS — Z7901 Long term (current) use of anticoagulants: Secondary | ICD-10-CM

## 2013-11-23 DIAGNOSIS — Z95811 Presence of heart assist device: Secondary | ICD-10-CM

## 2013-11-24 ENCOUNTER — Ambulatory Visit (HOSPITAL_COMMUNITY): Payer: Self-pay | Admitting: *Deleted

## 2013-11-24 ENCOUNTER — Other Ambulatory Visit (HOSPITAL_COMMUNITY): Payer: Self-pay | Admitting: *Deleted

## 2013-11-24 ENCOUNTER — Ambulatory Visit (HOSPITAL_COMMUNITY)
Admission: RE | Admit: 2013-11-24 | Discharge: 2013-11-24 | Disposition: A | Discharge status: Home / Self Care | Payer: Medicare Other | Source: Ambulatory Visit | Attending: Internal Medicine | Admitting: Internal Medicine

## 2013-11-24 VITALS — BP 84/1 | HR 84 | Wt 173.6 lb

## 2013-11-24 DIAGNOSIS — Z79899 Other long term (current) drug therapy: Secondary | ICD-10-CM

## 2013-11-24 DIAGNOSIS — I5022 Chronic systolic (congestive) heart failure: Secondary | ICD-10-CM

## 2013-11-24 DIAGNOSIS — I4891 Unspecified atrial fibrillation: Secondary | ICD-10-CM | POA: Diagnosis not present

## 2013-11-24 DIAGNOSIS — Z7901 Long term (current) use of anticoagulants: Secondary | ICD-10-CM

## 2013-11-24 DIAGNOSIS — Z95811 Presence of heart assist device: Secondary | ICD-10-CM

## 2013-11-24 DIAGNOSIS — I10 Essential (primary) hypertension: Secondary | ICD-10-CM

## 2013-11-24 LAB — COMPREHENSIVE METABOLIC PANEL
ALT: 34 U/L (ref 0–35)
AST: 32 U/L (ref 0–37)
Albumin: 3.3 g/dL — ABNORMAL LOW (ref 3.5–5.2)
Alkaline Phosphatase: 106 U/L (ref 39–117)
BUN: 17 mg/dL (ref 6–23)
CO2: 22 mEq/L (ref 19–32)
Calcium: 9.1 mg/dL (ref 8.4–10.5)
Chloride: 102 mEq/L (ref 96–112)
Creatinine, Ser: 1.46 mg/dL — ABNORMAL HIGH (ref 0.50–1.10)
GFR calc Af Amer: 39 mL/min — ABNORMAL LOW (ref >90)
GFR calc non Af Amer: 34 mL/min — ABNORMAL LOW (ref >90)
Glucose, Bld: 84 mg/dL (ref 70–99)
Potassium: 3.9 mEq/L (ref 3.7–5.3)
Sodium: 140 mEq/L (ref 137–147)
Total Bilirubin: 0.4 mg/dL (ref 0.3–1.2)
Total Protein: 7.7 g/dL (ref 6.0–8.3)

## 2013-11-24 LAB — PROTIME-INR
INR: 2.06 — ABNORMAL HIGH (ref 0.00–1.49)
Prothrombin Time: 22.6 seconds — ABNORMAL HIGH (ref 11.6–15.2)

## 2013-11-24 LAB — PREALBUMIN: Prealbumin: 25.1 mg/dL (ref 17.0–34.0)

## 2013-11-24 LAB — CBC
HCT: 37.7 % (ref 36.0–46.0)
Hemoglobin: 12.9 g/dL (ref 12.0–15.0)
MCH: 31 pg (ref 26.0–34.0)
MCHC: 34.2 g/dL (ref 30.0–36.0)
MCV: 90.6 fL (ref 78.0–100.0)
Platelets: 229 10*3/uL (ref 150–400)
RBC: 4.16 MIL/uL (ref 3.87–5.11)
RDW: 15.3 % (ref 11.5–15.5)
WBC: 7.2 10*3/uL (ref 4.0–10.5)

## 2013-11-24 LAB — LACTATE DEHYDROGENASE: LDH: 329 U/L — ABNORMAL HIGH (ref 94–250)

## 2013-11-24 LAB — TSH: TSH: 3.963 u[IU]/mL (ref 0.350–4.500)

## 2013-11-24 LAB — PRO B NATRIURETIC PEPTIDE: Pro B Natriuretic peptide (BNP): 499.6 pg/mL — ABNORMAL HIGH (ref 0–450)

## 2013-11-24 LAB — T4, FREE: Free T4: 1.39 ng/dL (ref 0.80–1.80)

## 2013-11-24 NOTE — Progress Notes (Addendum)
Symptom  Yes  No  Details   Angina        x Activity:   Claudication        x How far:   Syncope        x When:   Stroke        x   Orthopnea        x How many pillows:  2  PND        x How often:  CPAP      N/A How many hrs:   Pedal edema        x        2 - 3 times week  Abd fullness        x   N&V        x   Diaphoresis        x          When:  Occasional night sweat   Bleeding       x   Urine color   Light yellow  SOB        x Activity:   Palpitations        x When:  ICD shock        x   Hospitlizaitons        x When/where/why:  ED visit        x When/where/why:  Other MD              x When/who/why:   Activity    Completed rehab; walking 1 mile 3x week  Fluid    No limitaions  Diet    No added salt   MAP:  84 HR: 67 SPO2: 84 Weight:  173.6 lbs Last weight:  172 lbs   LVAD interrogation reveals:  Speed:  9200 Flow:  4.7 Power:  5.4 PI:  6.4 Alarms:  none Events:   5 - 10 PI events Fixed speed:  9200 Low speed limit:  8600   I reviewed the LVAD parameters from today, and compared the results to the patient's prior recorded data. No programming changes were made. The LVAD is functioning within specified parameters. LVAD interrogation was negative for any significant power changes, alarms or PI events/speed drops. LVAD equipment check completed and is in good working order. Back-up equipment present. LVAD education done on emergency procedures and precautions and reviewed exit site care.    LVAD exit site: Well healed and incorporated. The velour is fully implanted at exit site. Dressing dry and intact. No erythema or drainage. Stabilization device present and accurately applied. Driveline dressing is being changed weekly per sterile technique using Sorbaview dressing with biopatch on exit site. Pt denies fever or chills. Pt/caregiver state they have adequate dressing supplies at home.  Pt/caregiver deny any alarms or VAD equipment issues. Pt is performing daily  controller and system monitor self tests along with completing weekly and monthly maintenance for LVAD equipment.  LVAD equipment check completed and is in good working order. Back-up equipment present. LVAD education done on emergency procedures and precautions and reviewed exit site care.    Patient completed Intermacs 6 mos Intermacs including QOL, KCCQ-12, and 6 minute walk where she completed 700 feet.

## 2013-11-24 NOTE — Patient Instructions (Signed)
1.  No change in meds 2.  Will call with INR results and coumadin dosing 3.  Check home meds and doses; call if different than on med sheet 4.  Return in 6 - 8 weeks to [place] clinic 5.  Check [email or web address] prior to trip to [place] to plot VAD centers along course.

## 2013-11-24 NOTE — Progress Notes (Addendum)
Patient ID: Brianna Hanson, female   DOB: 04-24-1935, 79 y.o.   MRN: [id number] HPI:  Brianna Hanson is a 79 year old female with a PMH of HF due to severe NICM (EF 15% S/P ICD, PAF (maintaing SR on amio), plasma cell disorder (Likely IgA MGUS) - saw Brianna Hanson on 08/12/13 for routine follow up; no complications, plans to see her in 6 mos and repeat protein studies. Underwent implantation of the HeartMate II LVAD on 04/06/13.  Follow up:  Last visit increased losartan to 150 mg daily and stopped potassium. Doing well. Graduated from CR program. She is walking at home about 3x a week about 1 mile with no issues. Denies palpitations, CP or dyspnea. Fatigue much improved and no longer having days that she remains in bed all day. No dizziness. Has not needed any demadex in 2 weeks. Noticing trace ankle edema. Weight at home 166-170 lbs.   Denies LVAD alarms.  Denies driveline trauma, erythema or drainage.  Denies ICD shocks.  Reports taking Coumadin as prescribed.  Denies bright red blood per rectum or melena, no dark urine or hematuria.     Past Medical History  Diagnosis Date  . Cardiac arrest - ventricular fibrillation 12/10    with successful resucitation, S/p ICD  . ICD (implantable cardiac defibrillator) in place     she has received appropriate therapy for VF  . Atrial fibrillation or flutter   . Osteopenia   . HTN (hypertension)     moderate  . Nonischemic cardiomyopathy     followed by Dr April Holding at [facility]  . CHF (congestive heart failure)   . Anemia   . S/P colonoscopy   . Plasma cell disorder 03/20/2012  . Plasma cell disorder 03/20/2012    Current Outpatient Prescriptions  Medication Sig Dispense Refill  . amiodarone (PACERONE) 200 MG tablet TAKE 1 TABLET BY MOUTH EVERY DAY  30 tablet  6  . amLODipine (NORVASC) 10 MG tablet Take one tab twice daily  60 tablet  6  . bisacodyl (BISACODYL) 5 MG EC tablet Take 1 tablet (5 mg total) by mouth daily as needed for constipation.  30 tablet  0   . carvedilol (COREG) 12.5 MG tablet TAKE 1 & 1/2 TABLETS BY MOUTH TWICE DAILY  60 tablet  3  . Cholecalciferol (VITAMIN D3) 1000 UNITS CAPS Take 1 tablet by mouth daily.        [name] gabapentin (NEURONTIN) 100 MG capsule TAKE ONE CAPSULE BY MOUTH AT BEDTIME OR AS DIRECTED  30 capsule  2  . hydrALAZINE (APRESOLINE) 100 MG tablet Take 1 tablet (100 mg total) by mouth 3 (three) times daily.  90 tablet  3  . losartan (COZAAR) 100 MG tablet Take 1.5 tablets (150 mg total) by mouth daily.  45 tablet  6  . pantoprazole (PROTONIX) 40 MG tablet TAKE 1 TABLET BY MOUTH EVERY DAY  30 tablet  6  . spironolactone (ALDACTONE) 25 MG tablet TAKE 1 TABLET BY MOUTH EVERY DAY  30 tablet  6  . torsemide (DEMADEX) 20 MG tablet Take demadex 20 mg if weight above 157 - 158 lbs      . traMADol (ULTRAM) 50 MG tablet Take 1-2 tablets q 6 hrs for pain.  60 tablet  3  . warfarin (COUMADIN) 2.5 MG tablet Take 1 tablet (2.5 mg total) by mouth daily.  90 tablet  3   No current facility-administered medications for this encounter.    Review of patient's allergies indicates  no known allergies.  REVIEW OF SYSTEMS: All systems negative except as listed in HPI, PMH and Problem list.  I reviewed the LVAD parameters from today, and compared the results to the patient's prior recorded data.  No programming changes were made.  The LVAD is functioning within specified parameters.  The patient performs LVAD self-test daily.  LVAD interrogation was negative for any significant power changes, alarms or PI events/speed drops.  LVAD equipment check completed and is in good working order.  Back-up equipment present.   LVAD education done on emergency procedures and precautions and reviewed exit site care.   Filed Vitals:   11/24/13 1006  BP: 84/1  Pulse: 84  Weight: 173 lb 9.6 oz (78.744 kg)  SpO2: 97%    LVAD parameters: Flow:  4.8 Speed:  9200 PI:  6.5 Power:  5.4 Alarms:  None Events:  Multiple PI events about 2-8 per day Fixed  speed:  9200 Low speed limit:  8600  Physical Exam: GENERAL: Well appearing, female who presents to clinic today in no acute distress. HEENT: normal  NECK: Supple, JVP flat  2+ bilaterally, no bruits.  No lymphadenopathy or thyromegaly appreciated.   CARDIAC:  LVAD hum present.  LUNGS:  Clear to auscultation bilaterally.  ABDOMEN:  Soft, round, nontender, positive bowel sounds x4.     LVAD exit site: one suture intact.   Dressing dry and intact.  No erythema or drainage.  Stabilization device present and accurately applied.  Driveline dressing is being changed every other day per sterile technique. EXTREMITIES:  Warm and dry, no cyanosis, clubbing, rash or edema  NEUROLOGIC:  Alert and oriented x 4.  Gait steady.  No aphasia.  No dysarthria.  Affect pleasant.     ICD: Interrogated, waiting on results   ASSESSMENT AND PLAN:  1) Chronic systolic HF: NICM, s/p ICD and LVAD (04/2013) - NYHA II symptoms on LVAD support. Volume status stable will continue torsemide PRN for weight greater than 157 lbs, she currently needs about 1 tablet every 2 weeks. - MAP is great <90 with increase in losartan. Will continue losartan 150 mg daily, norvasc 10 mg daily, hydralazine 100 mg TID, coreg 12.5 mg BID and spiro 25 mg dialy. Check BMET today. Did not tolerate INDUR d/t headaches. - Merlin ICD interrogated and no fluid. - Reinforced the need and importance of daily weights, a low sodium diet, and fluid restriction (less than 2 L a day). Instructed to call the HF clinic if weight increases more than 3 lbs overnight or 5 lbs in a week.  2) LVAD - All parameters stable. Will check labs today and assess LDH today. Pt is not on ASA d/t fatigue and dizziness. She is tried to restart ASA in the past however had dizziness with restarting. - Will do INTERMACS 6 months follow up today (QOL survey and 6 min walk test today) 3) PAF - Stable. ICD interrogated and awaiting results. Will addend note with information.  Check TSH, LFTs and T4 today. 4) HTN -Stable as above continue current meds.  5) Anticoagulation - Will draw INR today and make adjustments as needed.    Education done with patient from VAD Coordinator that with traveling in next few weeks to let [place] know and we will plot her travel path with local VAD centers. They will call to let us know the towns they are staying in and we will let the VAD Coordinators know when she will be traveling.  F/U 6-8 weeks Boyce Medici,  Elwyn Reach, NP-C 10:24 AM

## 2013-11-25 ENCOUNTER — Telehealth (HOSPITAL_COMMUNITY): Payer: Self-pay | Admitting: *Deleted

## 2013-11-25 ENCOUNTER — Ambulatory Visit (INDEPENDENT_AMBULATORY_CARE_PROVIDER_SITE_OTHER): Payer: Medicare Other | Admitting: *Deleted

## 2013-11-25 DIAGNOSIS — I428 Other cardiomyopathies: Secondary | ICD-10-CM | POA: Diagnosis not present

## 2013-11-25 DIAGNOSIS — I5022 Chronic systolic (congestive) heart failure: Secondary | ICD-10-CM | POA: Diagnosis not present

## 2013-11-25 LAB — MDC_IDC_ENUM_SESS_TYPE_INCLINIC
Battery Remaining Longevity: 66 mo
Brady Statistic RA Percent Paced: 17 %
Brady Statistic RV Percent Paced: 0.06 %
Date Time Interrogation Session: 20150326152242
HighPow Impedance: 41.8313
Implantable Pulse Generator Serial Number: 754815
Lead Channel Impedance Value: 287.5 Ohm
Lead Channel Impedance Value: 312.5 Ohm
Lead Channel Pacing Threshold Amplitude: 0.75 V
Lead Channel Pacing Threshold Amplitude: 1 V
Lead Channel Pacing Threshold Pulse Width: 0.5 ms
Lead Channel Pacing Threshold Pulse Width: 0.5 ms
Lead Channel Sensing Intrinsic Amplitude: 1.2 mV
Lead Channel Sensing Intrinsic Amplitude: 11.4 mV
Lead Channel Setting Pacing Amplitude: 2 V
Lead Channel Setting Pacing Amplitude: 2.5 V
Lead Channel Setting Pacing Pulse Width: 0.5 ms
Lead Channel Setting Sensing Sensitivity: 0.5 mV
Zone Setting Detection Interval: 300 ms
Zone Setting Detection Interval: 350 ms

## 2013-11-25 NOTE — Telephone Encounter (Signed)
Called pt/caregiver re: upcoming trip; provided VAD center contact info below:  [place] [facility] [place]  24/7 emergency: [phone]  Office: [phone]

## 2013-11-25 NOTE — Progress Notes (Signed)
ICD check in clinic. Normal device function. Thresholds and sensing consistent with previous device measurements. Impedance trends stable over time. No evidence of any ventricular arrhythmias. 2 mode switches <1%. Histogram distribution appropriate for patient and level of activity. No changes made this session. Device programmed at appropriate safety margins. Device programmed to optimize intrinsic conduction. Estimated longevity 4.2 years. Pt enrolled in remote follow-up. Plan to check device every 3 months remotely and in office annually. Patient education completed including shock plan. Alert tones/vibration demonstrated for patient.  ROV in May with Dr. Rayann Heman.  Checked by [employer].

## 2013-11-28 ENCOUNTER — Other Ambulatory Visit (HOSPITAL_COMMUNITY): Payer: Self-pay | Admitting: Anesthesiology

## 2013-11-29 ENCOUNTER — Telehealth (HOSPITAL_COMMUNITY): Payer: Self-pay | Admitting: *Deleted

## 2013-11-29 NOTE — Telephone Encounter (Signed)
Husband called to report they will be staying closer to [facility] while on vacation this week. Provided following contact info to nearest VAD center:   Janell Quiet, [place] coordinator: [phone]  24/7 emergency contact info:  [phone] and ask for VAD pager [phone] for coordinator on call  Husband verbalized understanding of same.

## 2013-11-30 ENCOUNTER — Other Ambulatory Visit (HOSPITAL_COMMUNITY): Payer: Self-pay | Admitting: Cardiology

## 2013-11-30 ENCOUNTER — Other Ambulatory Visit (HOSPITAL_COMMUNITY): Payer: Self-pay | Admitting: Anesthesiology

## 2013-12-01 ENCOUNTER — Ambulatory Visit (HOSPITAL_COMMUNITY): Payer: Self-pay | Admitting: *Deleted

## 2013-12-01 ENCOUNTER — Telehealth (HOSPITAL_COMMUNITY): Payer: Self-pay | Admitting: *Deleted

## 2013-12-01 ENCOUNTER — Other Ambulatory Visit (HOSPITAL_COMMUNITY): Payer: Self-pay | Admitting: *Deleted

## 2013-12-01 ENCOUNTER — Ambulatory Visit (INDEPENDENT_AMBULATORY_CARE_PROVIDER_SITE_OTHER): Payer: Medicare Other | Admitting: *Deleted

## 2013-12-01 DIAGNOSIS — Z7901 Long term (current) use of anticoagulants: Secondary | ICD-10-CM | POA: Diagnosis not present

## 2013-12-01 DIAGNOSIS — Z95811 Presence of heart assist device: Secondary | ICD-10-CM

## 2013-12-01 DIAGNOSIS — N289 Disorder of kidney and ureter, unspecified: Secondary | ICD-10-CM

## 2013-12-01 LAB — PROTIME-INR
INR: 2.6 ratio — ABNORMAL HIGH (ref 0.8–1.0)
Prothrombin Time: 27.4 s — ABNORMAL HIGH (ref 10.2–12.4)

## 2013-12-01 LAB — BASIC METABOLIC PANEL
BUN: 20 mg/dL (ref 6–23)
CO2: 21 mEq/L (ref 19–32)
Calcium: 9.2 mg/dL (ref 8.4–10.5)
Chloride: 110 mEq/L (ref 96–112)
Creatinine, Ser: 1.5 mg/dL — ABNORMAL HIGH (ref 0.4–1.2)
GFR: 44.68 mL/min — ABNORMAL LOW (ref >60.00)
Glucose, Bld: 83 mg/dL (ref 70–99)
Potassium: 4 mEq/L (ref 3.5–5.1)
Sodium: 139 mEq/L (ref 135–145)

## 2013-12-01 NOTE — Telephone Encounter (Signed)
Called patient per [name], NP - based on BMP results today, pt is to decrease Losartan to 100 mg daily with lab re-checked 12/15/13.  Pt verbalized understanding of same.

## 2013-12-02 ENCOUNTER — Other Ambulatory Visit (HOSPITAL_COMMUNITY): Payer: Self-pay | Admitting: Anesthesiology

## 2013-12-10 ENCOUNTER — Encounter: Payer: Self-pay | Admitting: *Deleted

## 2013-12-15 ENCOUNTER — Ambulatory Visit (HOSPITAL_COMMUNITY): Payer: Self-pay | Admitting: *Deleted

## 2013-12-15 ENCOUNTER — Other Ambulatory Visit (HOSPITAL_COMMUNITY): Payer: Self-pay | Admitting: *Deleted

## 2013-12-15 ENCOUNTER — Ambulatory Visit (INDEPENDENT_AMBULATORY_CARE_PROVIDER_SITE_OTHER): Payer: Medicare Other | Admitting: *Deleted

## 2013-12-15 DIAGNOSIS — N289 Disorder of kidney and ureter, unspecified: Secondary | ICD-10-CM

## 2013-12-15 DIAGNOSIS — Z95811 Presence of heart assist device: Secondary | ICD-10-CM | POA: Diagnosis not present

## 2013-12-15 DIAGNOSIS — Z7901 Long term (current) use of anticoagulants: Secondary | ICD-10-CM

## 2013-12-15 LAB — BASIC METABOLIC PANEL
BUN: 21 mg/dL (ref 6–23)
CO2: 25 mEq/L (ref 19–32)
Calcium: 8.8 mg/dL (ref 8.4–10.5)
Chloride: 108 mEq/L (ref 96–112)
Creatinine, Ser: 1.3 mg/dL — ABNORMAL HIGH (ref 0.4–1.2)
GFR: 52 mL/min — ABNORMAL LOW (ref >60.00)
Glucose, Bld: 82 mg/dL (ref 70–99)
Potassium: 3.7 mEq/L (ref 3.5–5.1)
Sodium: 139 mEq/L (ref 135–145)

## 2013-12-15 LAB — PROTIME-INR
INR: 3.7 ratio — ABNORMAL HIGH (ref 0.8–1.0)
Prothrombin Time: 38.1 s — ABNORMAL HIGH (ref 10.2–12.4)

## 2013-12-20 ENCOUNTER — Encounter: Payer: Self-pay | Admitting: Internal Medicine

## 2013-12-27 ENCOUNTER — Encounter (INDEPENDENT_AMBULATORY_CARE_PROVIDER_SITE_OTHER): Payer: Self-pay

## 2013-12-27 ENCOUNTER — Ambulatory Visit (HOSPITAL_COMMUNITY): Payer: Self-pay | Admitting: *Deleted

## 2013-12-27 ENCOUNTER — Other Ambulatory Visit (INDEPENDENT_AMBULATORY_CARE_PROVIDER_SITE_OTHER): Payer: Medicare Other

## 2013-12-27 DIAGNOSIS — Z7901 Long term (current) use of anticoagulants: Secondary | ICD-10-CM

## 2013-12-27 DIAGNOSIS — I4891 Unspecified atrial fibrillation: Secondary | ICD-10-CM | POA: Diagnosis not present

## 2013-12-27 DIAGNOSIS — Z95811 Presence of heart assist device: Secondary | ICD-10-CM

## 2013-12-27 DIAGNOSIS — I5023 Acute on chronic systolic (congestive) heart failure: Secondary | ICD-10-CM | POA: Diagnosis not present

## 2013-12-27 DIAGNOSIS — Z8679 Personal history of other diseases of the circulatory system: Secondary | ICD-10-CM

## 2013-12-27 DIAGNOSIS — I4892 Unspecified atrial flutter: Secondary | ICD-10-CM

## 2013-12-27 LAB — PROTIME-INR
INR: 2.3 ratio — ABNORMAL HIGH (ref 0.8–1.0)
Prothrombin Time: 24.5 s — ABNORMAL HIGH (ref 9.6–13.1)

## 2014-01-02 ENCOUNTER — Other Ambulatory Visit (HOSPITAL_COMMUNITY): Payer: Self-pay | Admitting: Anesthesiology

## 2014-01-02 ENCOUNTER — Other Ambulatory Visit (HOSPITAL_COMMUNITY): Payer: Self-pay | Admitting: Cardiology

## 2014-01-05 ENCOUNTER — Ambulatory Visit (HOSPITAL_COMMUNITY)
Admission: RE | Admit: 2014-01-05 | Discharge: 2014-01-05 | Disposition: A | Discharge status: Home / Self Care | Payer: Medicare Other | Source: Ambulatory Visit | Attending: Internal Medicine | Admitting: Internal Medicine

## 2014-01-05 ENCOUNTER — Other Ambulatory Visit (HOSPITAL_COMMUNITY): Payer: Self-pay | Admitting: *Deleted

## 2014-01-05 ENCOUNTER — Ambulatory Visit (HOSPITAL_COMMUNITY): Payer: Self-pay | Admitting: *Deleted

## 2014-01-05 VITALS — BP 76/0 | HR 61 | Ht 60.0 in | Wt 180.6 lb

## 2014-01-05 DIAGNOSIS — I10 Essential (primary) hypertension: Secondary | ICD-10-CM | POA: Insufficient documentation

## 2014-01-05 DIAGNOSIS — Z7901 Long term (current) use of anticoagulants: Secondary | ICD-10-CM

## 2014-01-05 DIAGNOSIS — Z95811 Presence of heart assist device: Secondary | ICD-10-CM | POA: Diagnosis not present

## 2014-01-05 DIAGNOSIS — I4891 Unspecified atrial fibrillation: Secondary | ICD-10-CM

## 2014-01-05 DIAGNOSIS — I428 Other cardiomyopathies: Secondary | ICD-10-CM | POA: Diagnosis not present

## 2014-01-05 DIAGNOSIS — D649 Anemia, unspecified: Secondary | ICD-10-CM | POA: Diagnosis not present

## 2014-01-05 DIAGNOSIS — I5022 Chronic systolic (congestive) heart failure: Secondary | ICD-10-CM | POA: Diagnosis not present

## 2014-01-05 DIAGNOSIS — I509 Heart failure, unspecified: Secondary | ICD-10-CM | POA: Diagnosis not present

## 2014-01-05 DIAGNOSIS — N182 Chronic kidney disease, stage 2 (mild): Secondary | ICD-10-CM

## 2014-01-05 LAB — BASIC METABOLIC PANEL
BUN: 22 mg/dL (ref 6–23)
CO2: 23 mEq/L (ref 19–32)
Calcium: 9 mg/dL (ref 8.4–10.5)
Chloride: 108 mEq/L (ref 96–112)
Creatinine, Ser: 1.54 mg/dL — ABNORMAL HIGH (ref 0.50–1.10)
GFR calc Af Amer: 36 mL/min — ABNORMAL LOW (ref >90)
GFR calc non Af Amer: 31 mL/min — ABNORMAL LOW (ref >90)
Glucose, Bld: 107 mg/dL — ABNORMAL HIGH (ref 70–99)
Potassium: 3.8 mEq/L (ref 3.7–5.3)
Sodium: 144 mEq/L (ref 137–147)

## 2014-01-05 LAB — CBC
HCT: 35.5 % — ABNORMAL LOW (ref 36.0–46.0)
Hemoglobin: 11.6 g/dL — ABNORMAL LOW (ref 12.0–15.0)
MCH: 30.4 pg (ref 26.0–34.0)
MCHC: 32.7 g/dL (ref 30.0–36.0)
MCV: 92.9 fL (ref 78.0–100.0)
Platelets: 207 10*3/uL (ref 150–400)
RBC: 3.82 MIL/uL — ABNORMAL LOW (ref 3.87–5.11)
RDW: 15.9 % — ABNORMAL HIGH (ref 11.5–15.5)
WBC: 6.4 10*3/uL (ref 4.0–10.5)

## 2014-01-05 LAB — PROTIME-INR
INR: 2.31 — ABNORMAL HIGH (ref 0.00–1.49)
Prothrombin Time: 24.6 seconds — ABNORMAL HIGH (ref 11.6–15.2)

## 2014-01-05 LAB — LACTATE DEHYDROGENASE: LDH: 305 U/L — ABNORMAL HIGH (ref 94–250)

## 2014-01-05 MED ORDER — LOSARTAN POTASSIUM 50 MG PO TABS
50.0000 mg | ORAL_TABLET | Freq: Every day | ORAL | Status: DC
Start: 2014-01-05 — End: 2014-07-11

## 2014-01-05 NOTE — Patient Instructions (Signed)
1.  Decrease Losartan to 50 mg daily. Come by clinic for BP check in 1 - 2 weeks. 2.  Will call with INR results and coumadin dosing. 3.  Return to clinic in 2 months with ramp echo. 4.  Bring power module with patient cable and battery charger to next clinic visit for annual maintenance.

## 2014-01-05 NOTE — Progress Notes (Addendum)
Patient ID: Brianna Hanson, female   DOB: December 14, 1934, 79 y.o.   MRN: [id number] HPI:  Brianna Hanson is a 79 year old female with a PMH of HF due to severe NICM (EF 15% S/P ICD, PAF (maintaing SR on amio), plasma cell disorder (Likely IgA MGUS) - saw Dr. [name] on 08/12/13 for routine follow up; no complications, plans to see her in 6 mos and repeat protein studies. Underwent implantation of the HeartMate II LVAD on 04/06/13.  Follow up: Since last visit losartan cut back to 100 mg daily d/t rise in Cr which it improved. Went to [place] and [place] since last visit. No issues. Denies SOB, orthopnea, CP or LE edema. Did great with traveling. She is walking at home about 3x a week about 1 mile with no issues. No dizziness. Has needed demadex one time since last visit. Weight up 7 lbs.    Denies LVAD alarms.  Denies driveline trauma, erythema or drainage.  Denies ICD shocks.  Reports taking Coumadin as prescribed.  Denies bright red blood per rectum or melena, no dark urine or hematuria.     Past Medical History  Diagnosis Date  . Cardiac arrest - ventricular fibrillation 12/10    with successful resucitation, S/p ICD  . ICD (implantable cardiac defibrillator) in place     she has received appropriate therapy for VF  . Atrial fibrillation or flutter   . Osteopenia   . HTN (hypertension)     moderate  . Nonischemic cardiomyopathy     followed by Dr April Holding at [facility]  . CHF (congestive heart failure)   . Anemia   . S/P colonoscopy   . Plasma cell disorder 03/20/2012  . Plasma cell disorder 03/20/2012    Current Outpatient Prescriptions  Medication Sig Dispense Refill  . amiodarone (PACERONE) 200 MG tablet TAKE 1 TABLET BY MOUTH EVERY DAY  30 tablet  6  . amLODipine (NORVASC) 10 MG tablet Take one tab twice daily  60 tablet  6  . carvedilol (COREG) 12.5 MG tablet TAKE 1 & 1/2 TABLETS BY MOUTH TWICE DAILY  60 tablet  3  . Cholecalciferol (VITAMIN D3) 1000 UNITS CAPS Take 1 tablet by mouth daily.         [name] gabapentin (NEURONTIN) 100 MG capsule TAKE ONE CAPSULE BY MOUTH AT BEDTIME OR AS DIRECTED  30 capsule  2  . hydrALAZINE (APRESOLINE) 100 MG tablet TAKE 1 TABLET BY MOUTH 3 TIMES A DAY  90 tablet  3  . losartan (COZAAR) 100 MG tablet Take 100 mg by mouth daily.      . pantoprazole (PROTONIX) 40 MG tablet TAKE 1 TABLET BY MOUTH EVERY DAY  30 tablet  6  . spironolactone (ALDACTONE) 25 MG tablet TAKE 1 TABLET BY MOUTH EVERY DAY  30 tablet  6  . torsemide (DEMADEX) 20 MG tablet Take demadex 20 mg if weight above 157 - 158 lbs      . traMADol (ULTRAM) 50 MG tablet TAKE 1 OR 2 TABLETS EVERY 6 HOURS AS NEEDED FOR PAIN.  60 tablet  1  . warfarin (COUMADIN) 2.5 MG tablet TAKE 5 MG FOR 2 MORE DAYS AND THEN GO BACK TO 2.5 MG DAILY.  60 tablet  3   No current facility-administered medications for this encounter.    Review of patient's allergies indicates no known allergies.  REVIEW OF SYSTEMS: All systems negative except as listed in HPI, PMH and Problem list.  Filed Vitals:   01/05/14 1013  BP: 76/0  Pulse: 61  Height: 5' (1.524 m)  Weight: 180 lb 9.6 oz (81.92 kg)  SpO2: 98%    LVAD parameters: Flow:  4.7 Speed:  9200 PI:  6.9 Power:  5.2 Alarms:  None Events:  5-10 PI events (32 PI events on 12/25/13) Fixed speed:  9200 Low speed limit:  8600  I  reviewed the LVAD parameters from today, and compared the results to the patient's prior recorded data.  No programming changes were made.  The LVAD is functioning within specified parameters.  The patient performs LVAD self-test daily.  LVAD interrogation was negative for any significant power changes, alarms or PI events/speed drops.  LVAD equipment check completed and is in good working order.  Back-up equipment present.   LVAD education done on emergency procedures and precautions and reviewed exit site care.   Physical Exam: GENERAL: Well appearing, female, NAD, [name] present HEENT: normal  NECK: Supple, JVP flat,  2+ bilaterally,  no bruits.  No lymphadenopathy or thyromegaly appreciated.   CARDIAC:  LVAD hum present.  LUNGS:  Clear to auscultation bilaterally.  ABDOMEN:  Soft, round, nontender, positive bowel sounds x4.     LVAD exit site: Dressing dry and intact.  No erythema or drainage.  Stabilization device present and accurately applied. Driveline dressing changed weekly EXTREMITIES:  Warm and dry, no cyanosis, clubbing, rash, bilateral trace edema  NEUROLOGIC:  Alert and oriented x 4.  Gait steady.  No aphasia.  No dysarthria.  Affect pleasant.      ASSESSMENT AND PLAN:  1) Chronic systolic HF: NICM, s/p ICD and LVAD (04/2013) - NYHA II symptoms on LVAD support. Volume status stable will continue torsemide PRN for weight gain more than 3lbs in a day. Weight is up 7 lbs since last visit, but does not appear to be fluid. Patient is more active now and walking multiple times a week over a mile. - MAP <90. Will continue norvasc 10 mg BID, hydralazine 100 mg TID, coreg 18.75 mg BID and spiro 25 mg daily. - Creatinine elevated to 1.54 and BUN trending up. Will cut losartan back to 50 mg daily and have her come in next week for MAP check.  - Reinforced the need and importance of daily weights, a low sodium diet, and fluid restriction (less than 2 L a day). Instructed to call the HF clinic if weight increases more than 3 lbs overnight or 5 lbs in a week.  2) LVAD - All parameters stable. She did have multiple PI events on 4/25 and she reported that is when she was traveling and she did not drink enough fluids. LDH and INR stable. Pt is not on ASA d/t fatigue and dizziness. She has tried to restart ASA in the past however had dizziness with restarting. - Next visit will do RAMP ECHO to assess pump speed and will have patient bring in all equipment for yearly check.  3) PAF - Stable. Continue Amio 200 mg daily. Last TSH and T4 nl in 10/2013. Last visit ICD interrogated and no evidence of any ventricular arrythmias.  4)  HTN -Stable. Cut losartan back d/t increase in Creatinine.  5) Anticoagulation - INR stable will continue q 2 week INRs 6) Anemia - on CBC Hgb down from 12.9 to 11.6 with HCT 35.5. No signs of bleeding. Will continue to follow and next visit if still trending down will start iron. Get iron studies next visit.   F/U 2 months Rande Brunt, NP-C 11:03  AM

## 2014-01-05 NOTE — Progress Notes (Signed)
Symptom  Yes  No  Details   Angina        x Activity:   Claudication        x How far:   Syncope        x When:   Stroke        x   Orthopnea        x How many pillows:  2  PND        x How often:  CPAP      N/A How many hrs:   Pedal edema              x     Abd fullness        x   N&V        x   Diaphoresis        x          When:  Occasional night sweat   Bleeding       x   Urine color   Light yellow  SOB        x Activity:   Palpitations        x When:  ICD shock        x   Hospitlizaitons        x When/where/why:  ED visit        x When/where/why:  Other MD              x When/who/why:   Activity    Completed rehab; walking 1 mile 3x week  Fluid    No limitaions  Diet    No added salt   MAP:  76 HR: 61 SPO2: 98 Weight:  180.6  lbs Last weight:  173.6  lbs   LVAD interrogation reveals:  Speed:  9200 Flow:  4.7 Power:  5.2 PI:  6.9 Alarms:  none Events:   5 - 10 PI events (32 PI events on 12/25/13) Fixed speed:  9200 Low speed limit:  8600   I reviewed the LVAD parameters from today, and compared the results to the patient's prior recorded data. No programming changes were made. The LVAD is functioning within specified parameters. LVAD interrogation was negative for any significant power changes, alarms or PI events/speed drops except on 12/25/13. Pt recalls she was in [place] all day and had decreased PO intake.  LVAD equipment check completed and is in good working order. Back-up equipment present. LVAD education done on emergency procedures and precautions and reviewed exit site care.    LVAD exit site: Well healed and incorporated. The velour is fully implanted at exit site. Dressing dry and intact. No erythema or drainage. Stabilization device present and accurately applied. Driveline dressing is being changed weekly per sterile technique using Sorbaview dressing with biopatch on exit site. Pt denies fever or chills. Pt/caregiver state they have adequate dressing  supplies at home.  Pt/caregiver deny any alarms or VAD equipment issues. Pt is performing daily controller and system monitor self tests along with completing weekly and monthly maintenance for LVAD equipment.  LVAD equipment check completed and is in good working order. Back-up equipment present. LVAD education done on emergency procedures and precautions and reviewed exit site care.

## 2014-01-06 ENCOUNTER — Other Ambulatory Visit: Payer: Self-pay | Admitting: Internal Medicine

## 2014-01-17 ENCOUNTER — Encounter (HOSPITAL_COMMUNITY): Payer: Self-pay

## 2014-01-17 NOTE — Progress Notes (Signed)
Patient came in for BP check, MAP dopplered at 80, patient states she feels great.  Brianna Hanson ok'd patient to come back as scheduled.

## 2014-01-18 ENCOUNTER — Other Ambulatory Visit (HOSPITAL_COMMUNITY): Payer: Self-pay | Admitting: *Deleted

## 2014-01-18 DIAGNOSIS — Z95811 Presence of heart assist device: Secondary | ICD-10-CM

## 2014-01-19 ENCOUNTER — Ambulatory Visit (INDEPENDENT_AMBULATORY_CARE_PROVIDER_SITE_OTHER): Payer: Medicare Other | Admitting: Internal Medicine

## 2014-01-19 ENCOUNTER — Other Ambulatory Visit (HOSPITAL_COMMUNITY): Payer: Self-pay | Admitting: Internal Medicine

## 2014-01-19 ENCOUNTER — Other Ambulatory Visit: Payer: Self-pay | Admitting: Internal Medicine

## 2014-01-19 ENCOUNTER — Other Ambulatory Visit (HOSPITAL_COMMUNITY): Payer: Self-pay | Admitting: Anesthesiology

## 2014-01-19 ENCOUNTER — Other Ambulatory Visit (HOSPITAL_COMMUNITY): Payer: Self-pay | Admitting: Cardiology

## 2014-01-19 ENCOUNTER — Encounter: Payer: Self-pay | Admitting: Internal Medicine

## 2014-01-19 VITALS — BP 92/0 | HR 64 | Ht 60.0 in | Wt 182.0 lb

## 2014-01-19 DIAGNOSIS — I4901 Ventricular fibrillation: Secondary | ICD-10-CM

## 2014-01-19 DIAGNOSIS — I4891 Unspecified atrial fibrillation: Secondary | ICD-10-CM | POA: Diagnosis not present

## 2014-01-19 DIAGNOSIS — I428 Other cardiomyopathies: Secondary | ICD-10-CM

## 2014-01-19 DIAGNOSIS — I5023 Acute on chronic systolic (congestive) heart failure: Secondary | ICD-10-CM | POA: Diagnosis not present

## 2014-01-19 DIAGNOSIS — Z95818 Presence of other cardiac implants and grafts: Secondary | ICD-10-CM | POA: Diagnosis not present

## 2014-01-19 DIAGNOSIS — I10 Essential (primary) hypertension: Secondary | ICD-10-CM

## 2014-01-19 DIAGNOSIS — I5022 Chronic systolic (congestive) heart failure: Secondary | ICD-10-CM | POA: Diagnosis not present

## 2014-01-19 LAB — MDC_IDC_ENUM_SESS_TYPE_INCLINIC
Battery Remaining Longevity: 63.6 mo
Brady Statistic RA Percent Paced: 23 %
Brady Statistic RV Percent Paced: 0.03 %
Date Time Interrogation Session: 20150520115152
HighPow Impedance: 43.4614
Implantable Pulse Generator Serial Number: 754815
Lead Channel Impedance Value: 312.5 Ohm
Lead Channel Impedance Value: 312.5 Ohm
Lead Channel Pacing Threshold Amplitude: 0.75 V
Lead Channel Pacing Threshold Amplitude: 1 V
Lead Channel Pacing Threshold Pulse Width: 0.5 ms
Lead Channel Pacing Threshold Pulse Width: 0.5 ms
Lead Channel Sensing Intrinsic Amplitude: 1.2 mV
Lead Channel Sensing Intrinsic Amplitude: 11.4 mV
Lead Channel Setting Pacing Amplitude: 2 V
Lead Channel Setting Pacing Amplitude: 2.5 V
Lead Channel Setting Pacing Pulse Width: 0.5 ms
Lead Channel Setting Sensing Sensitivity: 0.5 mV
Zone Setting Detection Interval: 300 ms
Zone Setting Detection Interval: 350 ms

## 2014-01-19 NOTE — Patient Instructions (Signed)
Remote monitoring is used to monitor your Pacemaker or ICD from home. This monitoring reduces the number of office visits required to check your device to one time per year. It allows us to keep an eye on the functioning of your device to ensure it is working properly. You are scheduled for a device check from home on 04/25/14. You may send your transmission at any time that day. If you have a wireless device, the transmission will be sent automatically. After your physician reviews your transmission, you will receive a postcard with your next transmission date.    Your physician wants you to follow-up in: 12 months with Dr Allred You will receive a reminder letter in the mail two months in advance. If you don't receive a letter, please call our office to schedule the follow-up appointment.    

## 2014-01-19 NOTE — Progress Notes (Signed)
PCP:  Scarlette Calico, MD Primary CHF:  Previously Dr April Holding at [facility] center  The patient presents today for electrophysiology followup.  Since having her LVAD placed, the patient has done much better.  She is pleased with her current condition.  Today, she denies symptoms of palpitations, chest pain, dizziness, presyncope, syncope, or ICD shock therapy.  Her SOB, orthopnea, and edema are markedly improved.  The patient feels that she is tolerating medications without difficulties and is otherwise without complaint today.   Past Medical History  Diagnosis Date  . Cardiac arrest - ventricular fibrillation 12/10    with successful resucitation, S/p ICD  . ICD (implantable cardiac defibrillator) in place     she has received appropriate therapy for VF  . Atrial fibrillation or flutter   . Osteopenia   . HTN (hypertension)     moderate  . Nonischemic cardiomyopathy     followed by Dr April Holding at [facility]  . CHF (congestive heart failure)   . Anemia   . S/P colonoscopy   . Plasma cell disorder 03/20/2012  . Plasma cell disorder 03/20/2012   Past Surgical History  Procedure Laterality Date  . Abdominal hysterectomy    . Cardiac catheterization    . Cardiac defibrillator placement      by JA for secondary prevention of sudden death  . Insertion of implantable left ventricular assist device N/A 04/06/2013    Procedure: INSERTION OF IMPLANTABLE LEFT VENTRICULAR ASSIST DEVICE;  Surgeon: Gaye Pollack, MD;  Location: [place];  Service: Open Heart Surgery;  Laterality: N/A;  . Placement of centrimag ventricular assist device Right 04/06/2013    Procedure: PLACEMENT OF CENTRIMAG VENTRICULAR ASSIST DEVICE;  Surgeon: Gaye Pollack, MD;  Location: [place];  Service: Open Heart Surgery;  Laterality: Right;  . Intraoperative transesophageal echocardiogram N/A 04/06/2013    Procedure: INTRAOPERATIVE TRANSESOPHAGEAL ECHOCARDIOGRAM;  Surgeon: Gaye Pollack, MD;  Location: [facility] OR;  Service: Open Heart  Surgery;  Laterality: N/A;    Current Outpatient Prescriptions  Medication Sig Dispense Refill  . amiodarone (PACERONE) 200 MG tablet TAKE 1 TABLET BY MOUTH EVERY DAY  30 tablet  2  . amLODipine (NORVASC) 10 MG tablet Take one tab twice daily  60 tablet  6  . carvedilol (COREG) 12.5 MG tablet TAKE 1 & 1/2 TABLETS BY MOUTH TWICE DAILY  60 tablet  3  . Cholecalciferol (VITAMIN D3) 1000 UNITS CAPS Take 1 tablet by mouth daily.        [name] gabapentin (NEURONTIN) 100 MG capsule TAKE ONE CAPSULE BY MOUTH AT BEDTIME OR AS DIRECTED  30 capsule  2  . hydrALAZINE (APRESOLINE) 100 MG tablet TAKE 1 TABLET BY MOUTH 3 TIMES A DAY  90 tablet  3  . losartan (COZAAR) 50 MG tablet Take 1 tablet (50 mg total) by mouth daily.  30 tablet  5  . pantoprazole (PROTONIX) 40 MG tablet TAKE 1 TABLET BY MOUTH EVERY DAY  30 tablet  6  . spironolactone (ALDACTONE) 25 MG tablet TAKE 1 TABLET BY MOUTH EVERY DAY  30 tablet  6  . torsemide (DEMADEX) 20 MG tablet Take demadex 20 mg if weight above 157 - 158 lbs      . traMADol (ULTRAM) 50 MG tablet TAKE 1 OR 2 TABLETS EVERY 6 HOURS AS NEEDED FOR PAIN.  60 tablet  1  . warfarin (COUMADIN) 2.5 MG tablet TAKE 2.5 MG DAILY EXCEPT ON SUNDAY TAKE 5 MG  No current facility-administered medications for this visit.    No Known Allergies  History   Social History  . Marital Status: Single    Spouse Name: N/A    Number of Children: 3  . Years of Education: N/A   Occupational History  . Not on file.   Social History Main Topics  . Smoking status: Former Research scientist (life sciences)  . Smokeless tobacco: Never Used     Comment: remote history of tobacco abuse  . Alcohol Use: No  . Drug Use: No  . Sexual Activity: Not Currently    Birth Control/ Protection: Surgical   Other Topics Concern  . Not on file   Social History Narrative   Lives with spouse who was recently diagnosed with esophageal ca and is receiving chemotherapy    Family History  Problem Relation Age of Onset  . Stroke  Brother   . Stroke Mother    Physical Exam: Filed Vitals:   01/19/14 1010  BP: 92/0  Pulse: 64  Height: 5' (1.524 m)  Weight: 182 lb (82.555 kg)    GEN- The patient is well appearing, alert and oriented x 3 today.   Head- normocephalic, atraumatic Eyes-  Sclera clear, conjunctiva pink Ears- hearing intact Oropharynx- clear Neck- supple,  Lungs- bibasilar rales, normal work of breathing Heart- Regular rate and rhythm, VAD hum is heard throughout GI- soft, NT, ND, + BS Extremities- no clubbing, cyanosis, no edema MS- no significant deformity or atrophy Skin- no rash or lesion Psych- euthymic mood, full affect Neuro- strength and sensation are intact ICD pocket is well healed  EKG today reveals sinus rhythm, LAD, inferior infarction, anterolateral infarction, nonspecific ST/T changes  Assessment and Plan:  1. Chronic systolic dysfunction Doing well s/p LVAD placement  2. Prior VF Normal ICD function See Pace Art report No changes today Continue amiodarone LFTs/TFTs to be followed by advanced heart failure team  3. afib No afib since last device interrogation Continue coumadin  Merlin check every 3 months I will see again in the device clinic in 1 year

## 2014-01-20 ENCOUNTER — Other Ambulatory Visit: Payer: Self-pay

## 2014-01-20 MED ORDER — HYDRALAZINE HCL 100 MG PO TABS: ORAL_TABLET | ORAL | Status: DC

## 2014-01-21 ENCOUNTER — Encounter: Payer: Self-pay | Admitting: Internal Medicine

## 2014-01-25 ENCOUNTER — Ambulatory Visit (HOSPITAL_COMMUNITY): Payer: Self-pay | Admitting: *Deleted

## 2014-01-25 ENCOUNTER — Ambulatory Visit (INDEPENDENT_AMBULATORY_CARE_PROVIDER_SITE_OTHER): Payer: Medicare Other | Admitting: *Deleted

## 2014-01-25 DIAGNOSIS — I5023 Acute on chronic systolic (congestive) heart failure: Secondary | ICD-10-CM

## 2014-01-25 DIAGNOSIS — Z95811 Presence of heart assist device: Secondary | ICD-10-CM | POA: Diagnosis not present

## 2014-01-25 DIAGNOSIS — I4891 Unspecified atrial fibrillation: Secondary | ICD-10-CM

## 2014-01-25 DIAGNOSIS — Z7901 Long term (current) use of anticoagulants: Secondary | ICD-10-CM

## 2014-01-25 DIAGNOSIS — I4892 Unspecified atrial flutter: Secondary | ICD-10-CM

## 2014-01-25 DIAGNOSIS — Z8679 Personal history of other diseases of the circulatory system: Secondary | ICD-10-CM

## 2014-01-25 LAB — BASIC METABOLIC PANEL
BUN: 19 mg/dL (ref 6–23)
CO2: 25 mEq/L (ref 19–32)
Calcium: 8.6 mg/dL (ref 8.4–10.5)
Chloride: 109 mEq/L (ref 96–112)
Creatinine, Ser: 1.2 mg/dL (ref 0.4–1.2)
GFR: 56 mL/min — ABNORMAL LOW (ref >60.00)
Glucose, Bld: 84 mg/dL (ref 70–99)
Potassium: 3.5 mEq/L (ref 3.5–5.1)
Sodium: 140 mEq/L (ref 135–145)

## 2014-01-25 LAB — PROTIME-INR
INR: 2.8 ratio — ABNORMAL HIGH (ref 0.8–1.0)
Prothrombin Time: 29.7 s — ABNORMAL HIGH (ref 9.6–13.1)

## 2014-02-03 ENCOUNTER — Other Ambulatory Visit (HOSPITAL_COMMUNITY): Payer: Self-pay | Admitting: Internal Medicine

## 2014-02-08 ENCOUNTER — Other Ambulatory Visit (INDEPENDENT_AMBULATORY_CARE_PROVIDER_SITE_OTHER): Payer: Medicare Other

## 2014-02-08 ENCOUNTER — Other Ambulatory Visit (HOSPITAL_BASED_OUTPATIENT_CLINIC_OR_DEPARTMENT_OTHER): Payer: Medicare Other

## 2014-02-08 ENCOUNTER — Ambulatory Visit (HOSPITAL_BASED_OUTPATIENT_CLINIC_OR_DEPARTMENT_OTHER): Payer: Medicare Other | Admitting: Oncology

## 2014-02-08 ENCOUNTER — Ambulatory Visit (HOSPITAL_COMMUNITY): Payer: Self-pay | Admitting: *Deleted

## 2014-02-08 ENCOUNTER — Telehealth: Payer: Self-pay | Admitting: Oncology

## 2014-02-08 ENCOUNTER — Encounter: Payer: Self-pay | Admitting: Oncology

## 2014-02-08 VITALS — BP 89/42 | HR 63 | Temp 97.8°F | Resp 18 | Ht 60.0 in | Wt 181.9 lb

## 2014-02-08 DIAGNOSIS — I509 Heart failure, unspecified: Secondary | ICD-10-CM

## 2014-02-08 DIAGNOSIS — I4892 Unspecified atrial flutter: Secondary | ICD-10-CM

## 2014-02-08 DIAGNOSIS — Z95811 Presence of heart assist device: Secondary | ICD-10-CM

## 2014-02-08 DIAGNOSIS — Z7901 Long term (current) use of anticoagulants: Secondary | ICD-10-CM

## 2014-02-08 DIAGNOSIS — I4891 Unspecified atrial fibrillation: Secondary | ICD-10-CM

## 2014-02-08 DIAGNOSIS — I5023 Acute on chronic systolic (congestive) heart failure: Secondary | ICD-10-CM | POA: Diagnosis not present

## 2014-02-08 DIAGNOSIS — D472 Monoclonal gammopathy: Secondary | ICD-10-CM | POA: Diagnosis not present

## 2014-02-08 DIAGNOSIS — Z8679 Personal history of other diseases of the circulatory system: Secondary | ICD-10-CM

## 2014-02-08 DIAGNOSIS — D729 Disorder of white blood cells, unspecified: Secondary | ICD-10-CM

## 2014-02-08 DIAGNOSIS — D7289 Other specified disorders of white blood cells: Secondary | ICD-10-CM | POA: Diagnosis not present

## 2014-02-08 LAB — CBC WITH DIFFERENTIAL/PLATELET
BASO%: 1.2 % (ref 0.0–2.0)
Basophils Absolute: 0.1 10*3/uL (ref 0.0–0.1)
EOS%: 1.8 % (ref 0.0–7.0)
Eosinophils Absolute: 0.1 10*3/uL (ref 0.0–0.5)
HCT: 34.7 % — ABNORMAL LOW (ref 34.8–46.6)
HGB: 11.4 g/dL — ABNORMAL LOW (ref 11.6–15.9)
LYMPH%: 18.4 % (ref 14.0–49.7)
MCH: 29.8 pg (ref 25.1–34.0)
MCHC: 32.9 g/dL (ref 31.5–36.0)
MCV: 90.5 fL (ref 79.5–101.0)
MONO#: 0.9 10*3/uL (ref 0.1–0.9)
MONO%: 10.8 % (ref 0.0–14.0)
NEUT#: 5.3 10*3/uL (ref 1.5–6.5)
NEUT%: 67.8 % (ref 38.4–76.8)
Platelets: 227 10*3/uL (ref 145–400)
RBC: 3.83 10*6/uL (ref 3.70–5.45)
RDW: 16.2 % — ABNORMAL HIGH (ref 11.2–14.5)
WBC: 7.9 10*3/uL (ref 3.9–10.3)
lymph#: 1.4 10*3/uL (ref 0.9–3.3)

## 2014-02-08 LAB — COMPREHENSIVE METABOLIC PANEL (CC13)
ALT: 24 U/L (ref 0–55)
AST: 25 U/L (ref 5–34)
Albumin: 3.2 g/dL — ABNORMAL LOW (ref 3.5–5.0)
Alkaline Phosphatase: 122 U/L (ref 40–150)
Anion Gap: 7 mEq/L (ref 3–11)
BUN: 21.6 mg/dL (ref 7.0–26.0)
CO2: 25 mEq/L (ref 22–29)
Calcium: 9 mg/dL (ref 8.4–10.4)
Chloride: 109 mEq/L (ref 98–109)
Creatinine: 1.4 mg/dL — ABNORMAL HIGH (ref 0.6–1.1)
Glucose: 99 mg/dl (ref 70–140)
Potassium: 4.2 mEq/L (ref 3.5–5.1)
Sodium: 141 mEq/L (ref 136–145)
Total Bilirubin: 0.41 mg/dL (ref 0.20–1.20)
Total Protein: 7.7 g/dL (ref 6.4–8.3)

## 2014-02-08 LAB — PROTIME-INR
INR: 2.7 ratio — ABNORMAL HIGH (ref 0.8–1.0)
Prothrombin Time: 29.5 s — ABNORMAL HIGH (ref 9.6–13.1)

## 2014-02-08 NOTE — Progress Notes (Signed)
Hematology and Oncology Follow Up Visit  Brianna Hanson [id number] 10-10-1934 79 y.o. 02/08/2014 10:02 AM     Principle Diagnosis: 79 year old with plasma cell disorder. She presented with IgA MGUS without evidence of multiple myeloma diagnosed in 2013.  Current therapy: observation and surveillance.   Interim History: Brianna Hanson returns today for a follow up visit. Since her last visit she has been doing relatively well. She continues to have the LVAD for her heart failure since August of 2014. She is doing well and adjusting her lifestyle to this. She had not had any hospitalizations or illnesses related to congestive heart failure. This continues to improve her quality of life dramatically. She Continues to be asymptomatic form that at this point. She is not reporting any back pain.  She is not reporting any pathological fracture. Has not reported any change in her activity level or performance status. She is rather functional and able to perform most activities of daily living without any hindrance or decline. She had not had any peripheral neuropathy. Had not reported any immune dysregulation or recurrent sinopulmonary infection. He does not report any headaches blurred vision no vision. For that report any chest pain shortness of breath cough or hemoptysis. She does report lower extremity edema which is chronic. She has not reported any hemoptysis or hematemesis. She does not report any nausea or vomiting or abdominal pain. Has not reported any frequency urgency or hesitancy. Rest of her review of system is unremarkable.  Medications: I have reviewed the patient's current medications. Current Outpatient Prescriptions  Medication Sig Dispense Refill  . amiodarone (PACERONE) 200 MG tablet TAKE 1 TABLET BY MOUTH EVERY DAY  30 tablet  2  . amLODipine (NORVASC) 10 MG tablet TAKE 1 TABLET BY MOUTH TWICE A DAY  60 tablet  6  . carvedilol (COREG) 12.5 MG tablet TAKE 1 & 1/2 TABLETS BY MOUTH TWICE  DAILY  60 tablet  3  . Cholecalciferol (VITAMIN D3) 1000 UNITS CAPS Take 1 tablet by mouth daily.        [name] gabapentin (NEURONTIN) 100 MG capsule TAKE ONE CAPSULE BY MOUTH AT BEDTIME OR AS DIRECTED  30 capsule  2  . hydrALAZINE (APRESOLINE) 100 MG tablet TAKE 1 TABLET BY MOUTH 3 TIMES A DAY  90 tablet  3  . losartan (COZAAR) 50 MG tablet Take 1 tablet (50 mg total) by mouth daily.  30 tablet  5  . pantoprazole (PROTONIX) 40 MG tablet TAKE 1 TABLET BY MOUTH EVERY DAY  30 tablet  6  . spironolactone (ALDACTONE) 25 MG tablet TAKE 1 TABLET BY MOUTH EVERY DAY  30 tablet  6  . torsemide (DEMADEX) 20 MG tablet Take demadex 20 mg if weight above 157 - 158 lbs      . traMADol (ULTRAM) 50 MG tablet TAKE 1 OR 2 TABLETS EVERY 6 HOURS AS NEEDED FOR PAIN.  60 tablet  1  . warfarin (COUMADIN) 2.5 MG tablet TAKE 2.5 MG DAILY EXCEPT ON SUNDAY TAKE 5 MG      . warfarin (COUMADIN) 2.5 MG tablet TAKE 5 MG FOR 2 MORE DAYS AND THEN GO BACK TO 2.5 MG DAILY.  60 tablet  3   No current facility-administered medications for this visit.    Allergies: No Known Allergies  Past Medical History, Surgical history, Social history, and Family History were reviewed and updated.  Physical Exam: Blood pressure 89/42, pulse 63, temperature 97.8 F (36.6 C), temperature source Oral, resp. rate 18,  height 5' (1.524 m), weight 181 lb 14.4 oz (82.509 kg), SpO2 98.00%. ECOG: 1 General appearance: alert awake not in any distress. Head: Normocephalic, without obvious abnormality, atraumatic Neck: no adenopathy Lymph nodes: Cervical, supraclavicular, and axillary nodes normal. Heart:regular rate and rhythm, S1, S2 normal, no murmur, click, rub or gallop Lung:chest clear, no wheezing, rales, normal symmetric air entry Abdomin: soft, non-tender, without masses or organomegaly EXT:no erythema, induration, or nodules. Trace edema noted.   Lab Results: Lab Results  Component Value Date   WBC 7.9 03-02-14   HGB 11.4* 2014/03/02    HCT 34.7* 03-02-14   MCV 90.5 2014/03/02   PLT 227 03-02-14     Chemistry      Component Value Date/Time   NA 141 03-02-2014 0913   NA 140 01/25/2014 0939   K 4.2 03-02-2014 0913   K 3.5 01/25/2014 0939   CL 109 01/25/2014 0939   CL 110* 02/10/2013 1053   CO2 25 03-02-14 0913   CO2 25 01/25/2014 0939   BUN 21.6 02-Mar-2014 0913   BUN 19 01/25/2014 0939   CREATININE 1.4* 03-02-14 0913   CREATININE 1.2 01/25/2014 0939      Component Value Date/Time   CALCIUM 9.0 03-02-14 0913   CALCIUM 8.6 01/25/2014 0939   ALKPHOS 122 2014-03-02 0913   ALKPHOS 106 11/24/2013 0954   AST 25 02-Mar-2014 0913   AST 32 11/24/2013 0954   ALT 24 Mar 02, 2014 0913   ALT 34 11/24/2013 0954   BILITOT 0.41 March 02, 2014 0913   BILITOT 0.4 11/24/2013 0954      Results for Cale, YEILIN ZWEBER (MRN [id number]) as of March 02, 2014 09:54  Ref. Range 03/24/2012 10:42 10/21/2012 09:28 02/10/2013 10:53 08/12/2013 08:53  IgA Latest Range: 69-380 mg/dL 849 (H) 746 (H) 696 (H) 1440 (H)  Results for SHANIKIA, KERNODLE (MRN [id number]) as of 03-02-2014 09:54  Ref. Range 03/24/2012 10:42 10/21/2012 09:28 02/10/2013 10:53 08/12/2013 08:53  M-SPIKE, % No range found 0.65 0.92 0.71 1.01    Impression and Plan: A 79 year old woman with the following issues:  1. Monoclonal gammopathy, IgA subtype with an M-spike of 0.9 g/dL, IgA level of 746. The differential diagnosis include multiple myeloma  vs. monoclonal gammopathy of undetermined significance.  She has no end organ damage to suggest multiple myeloma but she does have a risk of developing that in the future.This risk is slightly higher given the IgA subtype. We'll continue to monitor her closely and I will restage her with a bone survey and a possible bone marrow biopsy if she has a rise in her IgA level or M spike dramatically.   2. CHF: she is S/P LVAD placement without complications. This is improving her heart function and tolerating it well.  3. Followup: Will be in 6 months for repeat protein  studies.      Wyatt Portela, MD 03-02-1509:02 AM

## 2014-02-08 NOTE — Telephone Encounter (Signed)
gv and printed appt sched and avs for pt for DEC °

## 2014-02-10 LAB — SPEP & IFE WITH QIG
Albumin ELP: 48.8 % — ABNORMAL LOW (ref 55.8–66.1)
Alpha-1-Globulin: 4.7 % (ref 2.9–4.9)
Alpha-2-Globulin: 7.2 % (ref 7.1–11.8)
Beta 2: 20.4 % — ABNORMAL HIGH (ref 3.2–6.5)
Beta Globulin: 6.3 % (ref 4.7–7.2)
Gamma Globulin: 12.6 % (ref 11.1–18.8)
IgA: 1270 mg/dL — ABNORMAL HIGH (ref 69–380)
IgG (Immunoglobin G), Serum: 1240 mg/dL (ref 690–1700)
IgM, Serum: 61 mg/dL (ref 52–322)
M-Spike, %: 1.01 g/dL
Total Protein, Serum Electrophoresis: 7.2 g/dL (ref 6.0–8.3)

## 2014-02-10 LAB — KAPPA/LAMBDA LIGHT CHAINS
Kappa free light chain: 73 mg/dL — ABNORMAL HIGH (ref 0.33–1.94)
Kappa:Lambda Ratio: 29.8 — ABNORMAL HIGH (ref 0.26–1.65)
Lambda Free Lght Chn: 2.45 mg/dL (ref 0.57–2.63)

## 2014-02-28 ENCOUNTER — Ambulatory Visit (HOSPITAL_COMMUNITY): Payer: Self-pay | Admitting: *Deleted

## 2014-02-28 ENCOUNTER — Ambulatory Visit (INDEPENDENT_AMBULATORY_CARE_PROVIDER_SITE_OTHER): Payer: Medicare Other | Admitting: *Deleted

## 2014-02-28 DIAGNOSIS — Z7901 Long term (current) use of anticoagulants: Secondary | ICD-10-CM | POA: Diagnosis not present

## 2014-02-28 DIAGNOSIS — Z95811 Presence of heart assist device: Secondary | ICD-10-CM

## 2014-02-28 LAB — PROTIME-INR
INR: 2.6 ratio — ABNORMAL HIGH (ref 0.8–1.0)
Prothrombin Time: 28.6 s — ABNORMAL HIGH (ref 9.6–13.1)

## 2014-03-03 ENCOUNTER — Other Ambulatory Visit (HOSPITAL_COMMUNITY): Payer: Self-pay | Admitting: *Deleted

## 2014-03-03 ENCOUNTER — Other Ambulatory Visit (HOSPITAL_COMMUNITY): Payer: Self-pay | Admitting: Cardiology

## 2014-03-03 DIAGNOSIS — Z95811 Presence of heart assist device: Secondary | ICD-10-CM

## 2014-03-03 DIAGNOSIS — Z7901 Long term (current) use of anticoagulants: Secondary | ICD-10-CM

## 2014-03-03 DIAGNOSIS — I5023 Acute on chronic systolic (congestive) heart failure: Secondary | ICD-10-CM

## 2014-03-07 ENCOUNTER — Ambulatory Visit (HOSPITAL_BASED_OUTPATIENT_CLINIC_OR_DEPARTMENT_OTHER)
Admission: RE | Admit: 2014-03-07 | Discharge: 2014-03-07 | Disposition: A | Discharge status: Home / Self Care | Payer: Medicare Other | Source: Ambulatory Visit | Attending: Internal Medicine | Admitting: Internal Medicine

## 2014-03-07 ENCOUNTER — Ambulatory Visit (HOSPITAL_COMMUNITY): Payer: Self-pay | Admitting: *Deleted

## 2014-03-07 ENCOUNTER — Ambulatory Visit (HOSPITAL_COMMUNITY)
Admission: RE | Admit: 2014-03-07 | Discharge: 2014-03-07 | Disposition: A | Discharge status: Home / Self Care | Payer: Medicare Other | Source: Ambulatory Visit | Attending: Internal Medicine | Admitting: Internal Medicine

## 2014-03-07 VITALS — BP 86/0 | HR 66 | Ht 60.0 in | Wt 180.0 lb

## 2014-03-07 DIAGNOSIS — Z7901 Long term (current) use of anticoagulants: Secondary | ICD-10-CM

## 2014-03-07 DIAGNOSIS — I5022 Chronic systolic (congestive) heart failure: Secondary | ICD-10-CM

## 2014-03-07 DIAGNOSIS — I517 Cardiomegaly: Secondary | ICD-10-CM | POA: Insufficient documentation

## 2014-03-07 DIAGNOSIS — I5023 Acute on chronic systolic (congestive) heart failure: Secondary | ICD-10-CM | POA: Insufficient documentation

## 2014-03-07 DIAGNOSIS — I509 Heart failure, unspecified: Secondary | ICD-10-CM | POA: Diagnosis not present

## 2014-03-07 DIAGNOSIS — Z95811 Presence of heart assist device: Secondary | ICD-10-CM | POA: Diagnosis not present

## 2014-03-07 LAB — CBC
HCT: 35.8 % — ABNORMAL LOW (ref 36.0–46.0)
Hemoglobin: 11.6 g/dL — ABNORMAL LOW (ref 12.0–15.0)
MCH: 29.7 pg (ref 26.0–34.0)
MCHC: 32.4 g/dL (ref 30.0–36.0)
MCV: 91.6 fL (ref 78.0–100.0)
Platelets: 207 10*3/uL (ref 150–400)
RBC: 3.91 MIL/uL (ref 3.87–5.11)
RDW: 16.3 % — ABNORMAL HIGH (ref 11.5–15.5)
WBC: 6.6 10*3/uL (ref 4.0–10.5)

## 2014-03-07 LAB — LACTATE DEHYDROGENASE: LDH: 325 U/L — ABNORMAL HIGH (ref 94–250)

## 2014-03-07 LAB — BASIC METABOLIC PANEL
Anion gap: 14 (ref 5–15)
BUN: 20 mg/dL (ref 6–23)
CO2: 21 mEq/L (ref 19–32)
Calcium: 8.9 mg/dL (ref 8.4–10.5)
Chloride: 105 mEq/L (ref 96–112)
Creatinine, Ser: 1.32 mg/dL — ABNORMAL HIGH (ref 0.50–1.10)
GFR calc Af Amer: 44 mL/min — ABNORMAL LOW (ref >90)
GFR calc non Af Amer: 38 mL/min — ABNORMAL LOW (ref >90)
Glucose, Bld: 82 mg/dL (ref 70–99)
Potassium: 3.7 mEq/L (ref 3.7–5.3)
Sodium: 140 mEq/L (ref 137–147)

## 2014-03-07 LAB — PREALBUMIN: Prealbumin: 22.9 mg/dL (ref 17.0–34.0)

## 2014-03-07 LAB — PROTIME-INR
INR: 2.62 — ABNORMAL HIGH (ref 0.00–1.49)
Prothrombin Time: 28 seconds — ABNORMAL HIGH (ref 11.6–15.2)

## 2014-03-07 LAB — PRO B NATRIURETIC PEPTIDE: Pro B Natriuretic peptide (BNP): 321.3 pg/mL (ref 0–450)

## 2014-03-07 MED ORDER — CARVEDILOL 12.5 MG PO TABS: ORAL_TABLET | ORAL | Status: DC

## 2014-03-07 MED ORDER — HYDRALAZINE HCL 100 MG PO TABS
ORAL_TABLET | ORAL | Status: DC
Start: 2014-03-07 — End: 2014-10-17

## 2014-03-07 NOTE — Patient Instructions (Signed)
1.  No change in meds 2.  Re-check INR on 03/28/14 3.  Return to clinic in 2 months

## 2014-03-07 NOTE — Progress Notes (Signed)
Patient ID: Brianna Hanson, female   DOB: [birth year], 79 y.o.   MRN: [id number] HPI:  Brianna Hanson is a 79 year old female with a PMH of HF due to severe NICM (EF 15% S/P ICD, PAF (maintaing SR on amio), plasma cell disorder (Likely IgA MGUS) - saw Dr. [name] on 08/12/13 for routine follow up; no complications, plans to see her in 6 mos and repeat protein studies. Underwent implantation of the HeartMate II LVAD on 04/06/13.  Follow up: Since last visit losartan cut back to 50 mg daily d/t rise in Cr which has improved. Feeling good with increased stamina. Denies SOB, orthopnea, CP.  Does c/o of LE edema at end of day based on activity, clears overnight. Has not been taking PRN torsemide. She is walking at home about 3x a week about 1 mile with no issues. No dizziness. Weight stable.   Denies LVAD alarms.  Denies driveline trauma, erythema or drainage.  Denies ICD shocks.  Reports taking Coumadin as prescribed.  Denies bright red blood per rectum or melena, no dark urine or hematuria.     Past Medical History  Diagnosis Date  . Cardiac arrest - ventricular fibrillation 12/10    with successful resucitation, S/p ICD  . ICD (implantable cardiac defibrillator) in place     she has received appropriate therapy for VF  . Atrial fibrillation or flutter   . Osteopenia   . HTN (hypertension)     moderate  . Nonischemic cardiomyopathy     followed by Dr April Holding at [facility]  . CHF (congestive heart failure)   . Anemia   . S/P colonoscopy   . Plasma cell disorder 03/20/2012  . Plasma cell disorder 03/20/2012    Current Outpatient Prescriptions  Medication Sig Dispense Refill  . amiodarone (PACERONE) 200 MG tablet TAKE 1 TABLET BY MOUTH EVERY DAY  30 tablet  2  . amLODipine (NORVASC) 10 MG tablet TAKE 1 TABLET BY MOUTH TWICE A DAY  60 tablet  6  . carvedilol (COREG) 12.5 MG tablet Take 1.5 tabs twice daily  90 tablet  6  . Cholecalciferol (VITAMIN D3) 1000 UNITS CAPS Take 1 tablet by mouth daily.         [name] gabapentin (NEURONTIN) 100 MG capsule TAKE ONE CAPSULE BY MOUTH AT BEDTIME OR AS DIRECTED  30 capsule  2  . hydrALAZINE (APRESOLINE) 100 MG tablet TAKE 1 TABLET BY MOUTH 3 TIMES A DAY  90 tablet  6  . losartan (COZAAR) 50 MG tablet Take 1 tablet (50 mg total) by mouth daily.  30 tablet  5  . pantoprazole (PROTONIX) 40 MG tablet TAKE 1 TABLET BY MOUTH EVERY DAY  30 tablet  6  . spironolactone (ALDACTONE) 25 MG tablet TAKE 1 TABLET BY MOUTH EVERY DAY  30 tablet  6  . warfarin (COUMADIN) 2.5 MG tablet TAKE 2.5 MG DAILY EXCEPT ON SUNDAY TAKE 5 MG      . torsemide (DEMADEX) 20 MG tablet Take demadex 20 mg if weight above 157 - 158 lbs       No current facility-administered medications for this encounter.    Review of patient's allergies indicates no known allergies.  REVIEW OF SYSTEMS: All systems negative except as listed in HPI, PMH and Problem list.  Filed Vitals:   03/07/14 0831  BP: 86/0  Pulse: 66  Height: 5' (1.524 m)  Weight: 180 lb (81.647 kg)  SpO2: 96%    LVAD parameters: Flow:  4.6 Speed:  9200 PI:  6.6 Power:  5.2 Alarms:  None Events:  3 - 8  PI events  Fixed speed:  9200 Low speed limit:  8600  I  reviewed the LVAD parameters from today, and compared the results to the patient's prior recorded data.  No programming changes were made.  The LVAD is functioning within specified parameters.  The patient performs LVAD self-test daily.  LVAD interrogation was negative for any significant power changes, alarms or PI events/speed drops.  LVAD equipment check completed and is in good working order.  Back-up equipment present.   LVAD education done on emergency procedures and precautions and reviewed exit site care.   Physical Exam: GENERAL: Well appearing, female, NAD, [name] present HEENT: normal  NECK: Supple, JVP 6 - 7,  2+ bilaterally, no bruits.  No lymphadenopathy or thyromegaly appreciated.   CARDIAC:  LVAD hum present.  LUNGS:  Clear to auscultation bilaterally.   ABDOMEN:  Soft, round, nontender, positive bowel sounds x4.     LVAD exit site: Dressing dry and intact.  No erythema or drainage.  Stabilization device present and accurately applied. Driveline dressing changed weekly. EXTREMITIES:  Warm and dry, no cyanosis, clubbing, rash, bilateral trace edema  NEUROLOGIC:  Alert and oriented x 4.  Gait steady.  No aphasia.  No dysarthria.  Affect pleasant.     Speed  Flow  PI  Power  LVIDD  AI  Aortic opening  MR  TR  Septum  RV   9200  4.6  6.6  5.2  3.26  trace  0/0  trace  Mild  midline  Mild to mod   9000  4.6  7.3  5.0  3.92  trace  1/5  trivial  mild  midline                 EF 55%   At completion of ramp study, patients primary and back up controller programmed:  Fixed speed 9200 RPM  Low speed limit 8600 RPM   ASSESSMENT AND PLAN:  1) Chronic systolic HF: NICM, s/p ICD and LVAD (04/2013) - She is doing great. On echo today LVEF has now normalizes. RV still slightly down.  - NYHA II symptoms on LVAD support.  - Will continue current regimen - Volume status stable will continue torsemide PRN for weight gain more than 3lbs in a day. Weight is stable. Patient is more active now and walking multiple times a week over a mile. - MAP <90. Will continue norvasc 10 mg BID, hydralazine 100 mg TID, coreg 18.75 mg BID and spiro 25 mg daily. - Creatinine improved with decreasing losartan to 50 mg daily.  - Reinforced the need and importance of daily weights, a low sodium diet, and fluid restriction (less than 2 L a day). Instructed to call the HF clinic if weight increases more than 3 lbs overnight or 5 lbs in a week.  2) LVAD - All parameters stable. LDH and INR stable. Pt is not on ASA d/t fatigue and dizziness. She has tried to restart ASA in the past however had dizziness with restarting. - Ramp echo today revealed improved LV function with EF 55%. RV function remains mild to moderately depressed. We briefly discussed possibiity of VAD explant but  given how longstanding her LV dysfunction has been I would be very concerned about recurrent LV dysfunction. She is not interested in pump explant.  3) PAF - Stable. Continue Amio 200 mg daily.   4) HTN -Stable.  5) Anticoagulation - INR stable will advance to q 3 week INRs 6) Anemia - Hgb stable   Completed 1 year Intermacs f/u.  Patient completed 700 feet during 6 minute walk w/o symptoms.  F/U 2 months  Glori Bickers, MD 3:04 PM

## 2014-03-07 NOTE — Progress Notes (Signed)
Symptom  Yes  No  Details   Angina        x Activity:   Claudication        x How far:   Syncope        x When:   Stroke        x   Orthopnea        x How many pillows:  2  PND        x How often:  CPAP      N/A How many hrs:   Pedal edema        x        clears overnight   Abd fullness        x   N&V        x   Diaphoresis        x          When:  Occasional night sweat   Bleeding       x   Urine color   Light yellow  SOB        x Activity:   Palpitations        x When:  ICD shock        x   Hospitlizaitons        x When/where/why:  ED visit        x When/where/why:  Other MD              x When/who/why:   Activity    walking 1 mile 3x week  Fluid    No limitaions  Diet    No added salt   MAP:  86 HR: 66 SPO2: 96 Weight:  180  lbs Last weight:  180.6  lbs   LVAD interrogation reveals:  Speed:  9200 Flow:  4.6 Power:  5.2 PI:  6.6 Alarms:  none Events:   3 - 8 PI events Fixed speed:  9200 Low speed limit:  8600   I reviewed the LVAD parameters from today, and compared the results to the patient's prior recorded data. No programming changes were made. The LVAD is functioning within specified parameters. LVAD interrogation was negative for any significant power changes, alarms or PI events/speed drops. LVAD equipment check completed and is in good working order. Back-up equipment present. LVAD education done on emergency procedures and precautions and reviewed exit site care.    LVAD exit site: Well healed and incorporated. The velour is fully implanted at exit site. Dressing dry and intact. No erythema or drainage. Stabilization device present and accurately applied. Driveline dressing is being changed weekly per sterile technique using Sorbaview dressing with biopatch on exit site. Pt denies fever or chills. Pt/caregiver provided with dressing supplies.  Pt/caregiver deny any alarms or VAD equipment issues. Pt is performing daily controller and system monitor self  tests along with completing weekly and monthly maintenance for LVAD equipment.  LVAD equipment check completed and is in good working order. Back-up equipment present. LVAD education done on emergency procedures and precautions and reviewed exit site care.     Speed    Flow     PI   Power   LVIDD      AI   Aortic openings     MR      TR  Septum        RV  9200 4.6 6.6 5.2 3.26 trace 0/0 trace Mild  midline Mild to mod  9000 4.6 7.3 5.0 3.92  trace 1/5 trivial mild midline                                                                             Ramp ECHO performed per [name]:  EF 55%  At completion of ramp study, patients primary and back up controller programmed:  Fixed speed    9200    RPM  Low speed limit  8600  RPM  Reviewed and demonstrated the following to patient and caregiver:                Reviewed the steps for replacing the running system controller with the back-up system controller (see patient handbook section 2 or the appropriate pamphlet).             X  Demonstrated (using the mock-driveline and controller) how to connect and disconnect the mock-driveline in back up system controller in a timely manner (less than 10 seconds) with return demonstration by patient and caregiver.              X   Reviewed system controller alarms and troubleshooting including hazard and advisory alarms and accessing alarm history. Re-enforced NOT TO attempt to perform any task displayed on the display screen alone or without calling the VAD pager [phone] (see patient handbook section 5 or the appropriate pamphlet).                       X  Reviewed how to handle an emergency including when the pump is running and when the pump has stopped (see patient handbook section 8). Call 911 first and then the VAD pager at [phone].             X   Reviewed 14-volt lithium ion battery calibration steps (see patient handbook section 3).            X   Reviewed contents of  black bag and what must be with patient at all times.            X  Reviewed driveline exit site including cleansing, dressing, and immobilizing with an anchor device to prevent exit site trauma.             X  Reviewed weekly maintenance which includes: Reviewing "Replacing the [place] with a [employer]" pamphlet. Clean batteries, clips, and battery charger contacts.  Check cables for damages. Rotate batteries; keep all 8 charged.              X  Reviewed monthly maintenance which includes: Reviewing Alarms and Troubleshooting. Check battery manufacturer dates. Check use/charge cycles for each battery; remember to re-calibrate every 70 uses when prompted.              X  Additional pamphlets Guide to Replacing the [place] with the [place] for Patients and Their Caregivers and HM II Alarms for Patients and Their Caregivers given to patient.              X   Batteries Manufacture Date: Number of uses: Re-calibration  12/2012 @ 68 Will be performed by caregiver   Annual maintenance completed per [employer] on patient's home power  module and Electrical engineer.    Backup system controller 11 volt battery charged during visit.   1 year Intermacs follow up completed including:  EQ-5D-3L, KCCQ-12, and Neurocognitive trail making.   Pt completed 700 feet during 6 minute walk with no symptoms.

## 2014-03-07 NOTE — Progress Notes (Signed)
  Echocardiogram 2D Echocardiogram has been performed.  Brianna Hanson 03/07/2014, 9:06 AM

## 2014-03-08 ENCOUNTER — Other Ambulatory Visit (HOSPITAL_COMMUNITY): Payer: Self-pay | Admitting: *Deleted

## 2014-03-08 DIAGNOSIS — Z95811 Presence of heart assist device: Secondary | ICD-10-CM

## 2014-03-08 DIAGNOSIS — Z7901 Long term (current) use of anticoagulants: Secondary | ICD-10-CM

## 2014-03-08 DIAGNOSIS — Z79899 Other long term (current) drug therapy: Secondary | ICD-10-CM

## 2014-03-28 ENCOUNTER — Ambulatory Visit (INDEPENDENT_AMBULATORY_CARE_PROVIDER_SITE_OTHER): Payer: Medicare Other | Admitting: *Deleted

## 2014-03-28 ENCOUNTER — Ambulatory Visit (HOSPITAL_COMMUNITY): Payer: Self-pay | Admitting: *Deleted

## 2014-03-28 DIAGNOSIS — Z95811 Presence of heart assist device: Secondary | ICD-10-CM | POA: Diagnosis not present

## 2014-03-28 DIAGNOSIS — Z7901 Long term (current) use of anticoagulants: Secondary | ICD-10-CM

## 2014-03-28 LAB — PROTIME-INR
INR: 2.5 ratio — ABNORMAL HIGH (ref 0.8–1.0)
Prothrombin Time: 27.5 s — ABNORMAL HIGH (ref 9.6–13.1)

## 2014-04-08 ENCOUNTER — Other Ambulatory Visit: Payer: Self-pay | Admitting: Internal Medicine

## 2014-04-16 ENCOUNTER — Other Ambulatory Visit (HOSPITAL_COMMUNITY): Payer: Self-pay | Admitting: Internal Medicine

## 2014-04-18 ENCOUNTER — Ambulatory Visit (INDEPENDENT_AMBULATORY_CARE_PROVIDER_SITE_OTHER): Payer: Medicare Other | Admitting: *Deleted

## 2014-04-18 ENCOUNTER — Ambulatory Visit (HOSPITAL_COMMUNITY): Payer: Self-pay | Admitting: *Deleted

## 2014-04-18 DIAGNOSIS — Z95811 Presence of heart assist device: Secondary | ICD-10-CM | POA: Diagnosis not present

## 2014-04-18 DIAGNOSIS — Z7901 Long term (current) use of anticoagulants: Secondary | ICD-10-CM

## 2014-04-18 DIAGNOSIS — Z79899 Other long term (current) drug therapy: Secondary | ICD-10-CM

## 2014-04-18 LAB — PROTIME-INR
INR: 3.3 ratio — ABNORMAL HIGH (ref 0.8–1.0)
Prothrombin Time: 35.8 s — ABNORMAL HIGH (ref 9.6–13.1)

## 2014-04-25 ENCOUNTER — Telehealth: Payer: Self-pay | Admitting: Cardiology

## 2014-04-25 ENCOUNTER — Encounter: Payer: Medicare Other | Admitting: *Deleted

## 2014-04-25 NOTE — Telephone Encounter (Signed)
LMOVM reminding pt to send remote transmission.   

## 2014-04-26 ENCOUNTER — Encounter: Payer: Self-pay | Admitting: Cardiology

## 2014-04-29 ENCOUNTER — Ambulatory Visit (INDEPENDENT_AMBULATORY_CARE_PROVIDER_SITE_OTHER): Payer: Medicare Other | Admitting: *Deleted

## 2014-04-29 ENCOUNTER — Telehealth: Payer: Self-pay | Admitting: Internal Medicine

## 2014-04-29 DIAGNOSIS — I428 Other cardiomyopathies: Secondary | ICD-10-CM | POA: Diagnosis not present

## 2014-04-29 DIAGNOSIS — I4901 Ventricular fibrillation: Secondary | ICD-10-CM

## 2014-04-29 LAB — MDC_IDC_ENUM_SESS_TYPE_REMOTE
Battery Remaining Longevity: 55 mo
Battery Remaining Percentage: 54 %
Battery Voltage: 2.95 V
Brady Statistic AP VP Percent: 1 %
Brady Statistic AP VS Percent: 26 %
Brady Statistic AS VP Percent: 1 %
Brady Statistic AS VS Percent: 73 %
Brady Statistic RA Percent Paced: 25 %
Brady Statistic RV Percent Paced: 1 %
Date Time Interrogation Session: 20150824071342
HighPow Impedance: 44 Ohm
Implantable Pulse Generator Serial Number: 754815
Lead Channel Impedance Value: 300 Ohm
Lead Channel Impedance Value: 310 Ohm
Lead Channel Pacing Threshold Amplitude: 0.75 V
Lead Channel Pacing Threshold Amplitude: 1 V
Lead Channel Pacing Threshold Pulse Width: 0.5 ms
Lead Channel Pacing Threshold Pulse Width: 0.5 ms
Lead Channel Sensing Intrinsic Amplitude: 0.9 mV
Lead Channel Sensing Intrinsic Amplitude: 11.4 mV
Lead Channel Setting Pacing Amplitude: 2 V
Lead Channel Setting Pacing Amplitude: 2.5 V
Lead Channel Setting Pacing Pulse Width: 0.5 ms
Lead Channel Setting Sensing Sensitivity: 0.5 mV
Zone Setting Detection Interval: 300 ms
Zone Setting Detection Interval: 350 ms

## 2014-04-29 NOTE — Progress Notes (Signed)
Remote ICD transmission.   

## 2014-04-29 NOTE — Telephone Encounter (Signed)
New Prob    Pt states she is having some difficulty transmitting. She is requesting to speak to someone from the device clinic.

## 2014-04-29 NOTE — Telephone Encounter (Signed)
Informed pt that we received the transmission.

## 2014-05-03 ENCOUNTER — Encounter: Payer: Self-pay | Admitting: Cardiology

## 2014-05-05 ENCOUNTER — Other Ambulatory Visit (HOSPITAL_COMMUNITY): Payer: Self-pay | Admitting: *Deleted

## 2014-05-05 DIAGNOSIS — Z95811 Presence of heart assist device: Secondary | ICD-10-CM

## 2014-05-05 DIAGNOSIS — Z7901 Long term (current) use of anticoagulants: Secondary | ICD-10-CM

## 2014-05-09 ENCOUNTER — Encounter (HOSPITAL_COMMUNITY): Payer: Medicare Other

## 2014-05-10 ENCOUNTER — Ambulatory Visit (HOSPITAL_COMMUNITY)
Admission: RE | Admit: 2014-05-10 | Discharge: 2014-05-10 | Disposition: A | Discharge status: Home / Self Care | Payer: Medicare Other | Source: Ambulatory Visit | Attending: Internal Medicine | Admitting: Internal Medicine

## 2014-05-10 ENCOUNTER — Ambulatory Visit (HOSPITAL_COMMUNITY): Payer: Self-pay | Admitting: *Deleted

## 2014-05-10 ENCOUNTER — Encounter (HOSPITAL_COMMUNITY): Payer: Self-pay

## 2014-05-10 VITALS — BP 102/0 | HR 63 | Ht 60.0 in | Wt 184.6 lb

## 2014-05-10 DIAGNOSIS — I509 Heart failure, unspecified: Secondary | ICD-10-CM | POA: Insufficient documentation

## 2014-05-10 DIAGNOSIS — Z7901 Long term (current) use of anticoagulants: Secondary | ICD-10-CM

## 2014-05-10 DIAGNOSIS — I480 Paroxysmal atrial fibrillation: Secondary | ICD-10-CM

## 2014-05-10 DIAGNOSIS — Z95811 Presence of heart assist device: Secondary | ICD-10-CM

## 2014-05-10 DIAGNOSIS — I4891 Unspecified atrial fibrillation: Secondary | ICD-10-CM | POA: Diagnosis not present

## 2014-05-10 DIAGNOSIS — N182 Chronic kidney disease, stage 2 (mild): Secondary | ICD-10-CM

## 2014-05-10 DIAGNOSIS — Z79899 Other long term (current) drug therapy: Secondary | ICD-10-CM

## 2014-05-10 DIAGNOSIS — I129 Hypertensive chronic kidney disease with stage 1 through stage 4 chronic kidney disease, or unspecified chronic kidney disease: Secondary | ICD-10-CM | POA: Insufficient documentation

## 2014-05-10 DIAGNOSIS — I5022 Chronic systolic (congestive) heart failure: Secondary | ICD-10-CM

## 2014-05-10 DIAGNOSIS — I10 Essential (primary) hypertension: Secondary | ICD-10-CM

## 2014-05-10 LAB — COMPREHENSIVE METABOLIC PANEL
ALT: 25 U/L (ref 0–35)
AST: 27 U/L (ref 0–37)
Albumin: 3.1 g/dL — ABNORMAL LOW (ref 3.5–5.2)
Alkaline Phosphatase: 116 U/L (ref 39–117)
Anion gap: 13 (ref 5–15)
BUN: 13 mg/dL (ref 6–23)
CO2: 20 mEq/L (ref 19–32)
Calcium: 8.7 mg/dL (ref 8.4–10.5)
Chloride: 107 mEq/L (ref 96–112)
Creatinine, Ser: 1.03 mg/dL (ref 0.50–1.10)
GFR calc Af Amer: 59 mL/min — ABNORMAL LOW (ref >90)
GFR calc non Af Amer: 51 mL/min — ABNORMAL LOW (ref >90)
Glucose, Bld: 87 mg/dL (ref 70–99)
Potassium: 3.5 mEq/L — ABNORMAL LOW (ref 3.7–5.3)
Sodium: 140 mEq/L (ref 137–147)
Total Bilirubin: 0.3 mg/dL (ref 0.3–1.2)
Total Protein: 7.7 g/dL (ref 6.0–8.3)

## 2014-05-10 LAB — T4, FREE: Free T4: 1.24 ng/dL (ref 0.80–1.80)

## 2014-05-10 LAB — IRON AND TIBC
Iron: 63 ug/dL (ref 42–135)
Saturation Ratios: 23 % (ref 20–55)
TIBC: 270 ug/dL (ref 250–470)
UIBC: 207 ug/dL (ref 125–400)

## 2014-05-10 LAB — LACTATE DEHYDROGENASE: LDH: 290 U/L — ABNORMAL HIGH (ref 94–250)

## 2014-05-10 LAB — PROTIME-INR
INR: 2.84 — ABNORMAL HIGH (ref 0.00–1.49)
Prothrombin Time: 29.8 seconds — ABNORMAL HIGH (ref 11.6–15.2)

## 2014-05-10 LAB — TSH: TSH: 4.79 u[IU]/mL — ABNORMAL HIGH (ref 0.350–4.500)

## 2014-05-10 MED ORDER — WARFARIN SODIUM 2.5 MG PO TABS: ORAL_TABLET | ORAL | Status: DC

## 2014-05-10 NOTE — Progress Notes (Signed)
Patient ID: Brianna Hanson, female   DOB: [birth year], 79 y.o.   MRN: [id number]  PCP: Dr. Scarlette Calico  HPI: Brianna Hanson is a 79 year old female with a PMH of HF due to severe NICM, chronic systolic HF,  PAF,  plasma cell disorder (Likely IgA MGUS) - saw Dr. [name] on 08/12/13 for routine follow up; no complications, plans to see her in 6 mos and repeat protein studies. Underwent implantation of the HeartMate II LVAD on 04/06/13.  Follow up for Heart Failure: Doing well. Denies SOB, orthopnea, PND, CP or dizziness. Reports weight has been increasing slightly 177-178 lbs and usually 173-175 lbs. Eating well. Drinking less than 2L a day. Has not needed any torsemide. Still walking about 3x a week x1 mile with no issues. Has not taken medications today.   ECHO (03/2014): EF 55% and RV slightly down, RAMP ECHO  Denies LVAD alarms.  Denies driveline trauma, erythema or drainage.  Denies ICD shocks.  Reports taking Coumadin as prescribed.  Denies bright red blood per rectum or melena, no dark urine or hematuria.     Past Medical History  Diagnosis Date  . Cardiac arrest - ventricular fibrillation 12/10    with successful resucitation, S/p ICD  . ICD (implantable cardiac defibrillator) in place     she has received appropriate therapy for VF  . Atrial fibrillation or flutter   . Osteopenia   . HTN (hypertension)     moderate  . Nonischemic cardiomyopathy     followed by Dr April Holding at [facility]  . CHF (congestive heart failure)   . Anemia   . S/P colonoscopy   . Plasma cell disorder 03/20/2012  . Plasma cell disorder 03/20/2012    Current Outpatient Prescriptions  Medication Sig Dispense Refill  . amiodarone (PACERONE) 200 MG tablet TAKE 1 TABLET BY MOUTH EVERY DAY  30 tablet  5  . amLODipine (NORVASC) 10 MG tablet TAKE 1 TABLET BY MOUTH TWICE A DAY  60 tablet  6  . carvedilol (COREG) 12.5 MG tablet Take 1.5 tabs twice daily  90 tablet  6  . Cholecalciferol (VITAMIN D3) 1000 UNITS CAPS Take 1  tablet by mouth daily.        [name] gabapentin (NEURONTIN) 100 MG capsule TAKE ONE CAPSULE BY MOUTH AT BEDTIME OR AS DIRECTED  30 capsule  2  . hydrALAZINE (APRESOLINE) 100 MG tablet TAKE 1 TABLET BY MOUTH 3 TIMES A DAY  90 tablet  6  . losartan (COZAAR) 50 MG tablet Take 1 tablet (50 mg total) by mouth daily.  30 tablet  5  . pantoprazole (PROTONIX) 40 MG tablet TAKE 1 TABLET BY MOUTH EVERY DAY  30 tablet  6  . spironolactone (ALDACTONE) 25 MG tablet TAKE 1 TABLET BY MOUTH EVERY DAY  30 tablet  6  . warfarin (COUMADIN) 2.5 MG tablet Take one tab daily or as directed for INR 2.0 - 2.5  45 tablet  6  . torsemide (DEMADEX) 20 MG tablet Take demadex 20 mg if weight above 157 - 158 lbs       No current facility-administered medications for this encounter.    Review of patient's allergies indicates no known allergies.  REVIEW OF SYSTEMS: All systems negative except as listed in HPI, PMH and Problem list.  Filed Vitals:   05/10/14 0926  BP: 102/0  Pulse: 63  Height: 5' (1.524 m)  Weight: 184 lb 9.6 oz (83.734 kg)  SpO2: 94%    LVAD parameters:  Flow:  4.7 Speed:  9200 PI:  6.1 Power:  5.3 Alarms:  None Events:  5-10  PI events  Fixed speed:  9200 Low speed limit:  8600  I  reviewed the LVAD parameters from today, and compared the results to the patient's prior recorded data.  No programming changes were made.  The LVAD is functioning within specified parameters.  The patient performs LVAD self-test daily.  LVAD interrogation was negative for any significant power changes, alarms or PI events/speed drops.  LVAD equipment check completed and is in good working order.  Back-up equipment present.   LVAD education done on emergency procedures and precautions and reviewed exit site care.   Physical Exam: GENERAL: Well appearing, female, NAD, Brianna Hanson present HEENT: normal  NECK: Supple, JVP 7-8,  2+ bilaterally, no bruits.  No lymphadenopathy or thyromegaly appreciated.   CARDIAC:  LVAD hum  present.  LUNGS:  Clear to auscultation bilaterally.  ABDOMEN:  Soft, round, nontender, positive bowel sounds x4.     LVAD exit site: Dressing dry and intact.  No erythema or drainage.  Stabilization device present and accurately applied. Driveline dressing changed weekly. EXTREMITIES:  Warm and dry, no cyanosis, clubbing, rash, 1+ bilateral edema  NEUROLOGIC:  Alert and oriented x 4.  Gait steady.  No aphasia.  No dysarthria.  Affect pleasant.      ASSESSMENT AND PLAN:  1) Chronic systolic HF: NICM, s/p ICD and LVAD (04/2013) - Continues to do great. NYHA II symptoms and volume status mildly elevated. Instructed to take PRN torsemide if weight continues to trend up.  - MAP slightly elevated, however has not taken medications today. Will continue current medications and continue to follow.  - Reinforced the need and importance of daily weights, a low sodium diet, and fluid restriction (less than 2 L a day). Instructed to call the HF clinic if weight increases more than 3 lbs overnight or 5 lbs in a week.  2) LVAD - All parameters stable. - Check LDH, INR, CBC and BMET today.  - She is not on ASA d/t dizziness.   3) PAF - Stable. Continue Amio 200 mg daily. Check TSH and free T4 today. Check LFTs.  4) HTN -As above slightly elevated but did not take medications today. Will continue to follow.  5) Anticoagulation - Check INR and if remains stable will continue q 3 week INRs. As above no ASA d/t dizziness.   F/U 2 months Rande Brunt, NP-C 9:59 AM

## 2014-05-10 NOTE — Progress Notes (Signed)
Symptom  Yes  No  Details   Angina        x Activity:   Claudication        x How far:   Syncope        x When:   Stroke        x   Orthopnea        x How many pillows:  2  PND        x How often:  CPAP      N/A How many hrs:   Pedal edema        x       clears overnight; not taking prn torsemide  Abd fullness        x   N&V        x   Diaphoresis        x          When:  Occasional night sweat   Bleeding       x   Urine color   Light yellow  SOB        x Activity:   Palpitations        x When:  ICD shock        x   Hospitlizaitons        x When/where/why:  ED visit        x When/where/why:  Other MD              x When/who/why:   Activity    Walking 1 mile 3 x week  Fluid    No limitaions  Diet    No added salt   MAP:  102 HR: 63 SPO2: 94 Weight:  184.6  lbs Last weight:  180  lbs   LVAD interrogation reveals:  Speed:  9200 Flow:  5.0 Power:  5.5 PI:  7.1 Alarms:  none Events:   5 - 10 PI events  Fixed speed:  9200 Low speed limit:  8600  I reviewed the LVAD parameters from today, and compared the results to the patient's prior recorded data. No programming changes were made. The LVAD is functioning within specified parameters. LVAD interrogation was negative for any significant power changes, alarms or PI events/speed drops.  LVAD equipment check completed and is in good working order. Back-up equipment present. LVAD education done on emergency procedures and precautions and reviewed exit site care.    LVAD exit site: Well healed and incorporated. The velour is fully implanted at exit site. Dressing dry and intact. No erythema or drainage. Stabilization device present and accurately applied. Driveline dressing is being changed weekly per sterile technique using Sorbaview dressing with biopatch on exit site. Pt denies fever or chills. Pt/caregiver state they have adequate dressing supplies at home.

## 2014-05-23 ENCOUNTER — Encounter: Payer: Self-pay | Admitting: Internal Medicine

## 2014-05-30 ENCOUNTER — Other Ambulatory Visit (HOSPITAL_COMMUNITY): Payer: Self-pay | Admitting: *Deleted

## 2014-05-30 ENCOUNTER — Other Ambulatory Visit (HOSPITAL_COMMUNITY): Payer: Self-pay

## 2014-05-30 ENCOUNTER — Ambulatory Visit (HOSPITAL_COMMUNITY): Payer: Self-pay | Admitting: *Deleted

## 2014-05-30 ENCOUNTER — Other Ambulatory Visit (INDEPENDENT_AMBULATORY_CARE_PROVIDER_SITE_OTHER): Payer: Medicare Other | Admitting: *Deleted

## 2014-05-30 DIAGNOSIS — Z7901 Long term (current) use of anticoagulants: Secondary | ICD-10-CM

## 2014-05-30 DIAGNOSIS — Z95811 Presence of heart assist device: Secondary | ICD-10-CM

## 2014-05-30 DIAGNOSIS — I4891 Unspecified atrial fibrillation: Secondary | ICD-10-CM

## 2014-05-30 LAB — PROTIME-INR
INR: 2.3 ratio — ABNORMAL HIGH (ref 0.8–1.0)
INR: 2.3 — AB (ref <=1.1)
Prothrombin Time: 25.4 s — ABNORMAL HIGH (ref 9.6–13.1)

## 2014-05-30 LAB — CBC WITH DIFFERENTIAL/PLATELET
Basophils Absolute: 0 10*3/uL (ref 0.0–0.1)
Basophils Relative: 0.4 % (ref 0.0–3.0)
Eosinophils Absolute: 0.2 10*3/uL (ref 0.0–0.7)
Eosinophils Relative: 2.3 % (ref 0.0–5.0)
HCT: 33.4 % — ABNORMAL LOW (ref 36.0–46.0)
Hemoglobin: 11 g/dL — ABNORMAL LOW (ref 12.0–15.0)
Lymphocytes Relative: 23.7 % (ref 12.0–46.0)
Lymphs Abs: 1.5 10*3/uL (ref 0.7–4.0)
MCHC: 33 g/dL (ref 30.0–36.0)
MCV: 88.3 fl (ref 78.0–100.0)
Monocytes Absolute: 0.5 10*3/uL (ref 0.1–1.0)
Monocytes Relative: 7.7 % (ref 3.0–12.0)
Neutro Abs: 4.3 10*3/uL (ref 1.4–7.7)
Neutrophils Relative %: 65.9 % (ref 43.0–77.0)
Platelets: 222 10*3/uL (ref 150.0–400.0)
RBC: 3.78 Mil/uL — ABNORMAL LOW (ref 3.87–5.11)
RDW: 17.6 % — ABNORMAL HIGH (ref 11.5–15.5)
WBC: 6.5 10*3/uL (ref 4.0–10.5)

## 2014-06-09 ENCOUNTER — Other Ambulatory Visit (HOSPITAL_COMMUNITY): Payer: Self-pay | Admitting: *Deleted

## 2014-06-09 DIAGNOSIS — Z95811 Presence of heart assist device: Secondary | ICD-10-CM

## 2014-06-09 DIAGNOSIS — Z7901 Long term (current) use of anticoagulants: Secondary | ICD-10-CM

## 2014-06-13 ENCOUNTER — Other Ambulatory Visit (HOSPITAL_COMMUNITY): Payer: Self-pay | Admitting: Internal Medicine

## 2014-06-14 ENCOUNTER — Telehealth (HOSPITAL_COMMUNITY): Payer: Self-pay | Admitting: Vascular Surgery

## 2014-06-14 DIAGNOSIS — I5022 Chronic systolic (congestive) heart failure: Secondary | ICD-10-CM

## 2014-06-14 NOTE — Telephone Encounter (Signed)
Refill Spironolactone and pantoprazole

## 2014-06-15 MED ORDER — SPIRONOLACTONE 25 MG PO TABS: ORAL_TABLET | ORAL | Status: DC

## 2014-06-15 MED ORDER — PANTOPRAZOLE SODIUM 40 MG PO TBEC: DELAYED_RELEASE_TABLET | ORAL | Status: DC

## 2014-06-20 ENCOUNTER — Other Ambulatory Visit (INDEPENDENT_AMBULATORY_CARE_PROVIDER_SITE_OTHER): Payer: Medicare Other | Admitting: *Deleted

## 2014-06-20 ENCOUNTER — Ambulatory Visit (HOSPITAL_COMMUNITY): Payer: Self-pay | Admitting: *Deleted

## 2014-06-20 DIAGNOSIS — Z95811 Presence of heart assist device: Secondary | ICD-10-CM

## 2014-06-20 DIAGNOSIS — Z7901 Long term (current) use of anticoagulants: Secondary | ICD-10-CM

## 2014-06-20 LAB — PROTIME-INR
INR: 2.7 ratio — ABNORMAL HIGH (ref 0.8–1.0)
Prothrombin Time: 29.5 s — ABNORMAL HIGH (ref 9.6–13.1)

## 2014-06-25 ENCOUNTER — Other Ambulatory Visit (HOSPITAL_COMMUNITY): Payer: Self-pay | Admitting: Anesthesiology

## 2014-06-28 ENCOUNTER — Telehealth (HOSPITAL_COMMUNITY): Payer: Self-pay | Admitting: *Deleted

## 2014-06-28 NOTE — Telephone Encounter (Signed)
Returned call to patient caregiver re: planned trip. Nearest VAD center information shared:  [facility]  Emergency contact #:  [phone] and ask for VAD Coordinator on call Faxed last clinic note to [place] office per patient request.

## 2014-07-11 ENCOUNTER — Other Ambulatory Visit (HOSPITAL_COMMUNITY): Payer: Self-pay | Admitting: Anesthesiology

## 2014-07-11 ENCOUNTER — Ambulatory Visit (HOSPITAL_COMMUNITY): Payer: Self-pay | Admitting: *Deleted

## 2014-07-11 ENCOUNTER — Encounter (HOSPITAL_COMMUNITY): Payer: Self-pay | Admitting: *Deleted

## 2014-07-11 ENCOUNTER — Ambulatory Visit (HOSPITAL_COMMUNITY)
Admission: RE | Admit: 2014-07-11 | Discharge: 2014-07-11 | Disposition: A | Discharge status: Home / Self Care | Payer: Medicare Other | Source: Ambulatory Visit | Attending: Cardiology | Admitting: Cardiology

## 2014-07-11 ENCOUNTER — Other Ambulatory Visit (HOSPITAL_COMMUNITY): Payer: Self-pay | Admitting: *Deleted

## 2014-07-11 ENCOUNTER — Other Ambulatory Visit (HOSPITAL_COMMUNITY): Payer: Self-pay | Admitting: Internal Medicine

## 2014-07-11 VITALS — BP 92/0 | HR 60 | Wt 183.0 lb

## 2014-07-11 DIAGNOSIS — Z95811 Presence of heart assist device: Secondary | ICD-10-CM | POA: Diagnosis not present

## 2014-07-11 DIAGNOSIS — R1030 Lower abdominal pain, unspecified: Secondary | ICD-10-CM

## 2014-07-11 DIAGNOSIS — Z7901 Long term (current) use of anticoagulants: Secondary | ICD-10-CM

## 2014-07-11 DIAGNOSIS — Z8674 Personal history of sudden cardiac arrest: Secondary | ICD-10-CM | POA: Insufficient documentation

## 2014-07-11 DIAGNOSIS — I429 Cardiomyopathy, unspecified: Secondary | ICD-10-CM | POA: Insufficient documentation

## 2014-07-11 DIAGNOSIS — I509 Heart failure, unspecified: Secondary | ICD-10-CM | POA: Insufficient documentation

## 2014-07-11 DIAGNOSIS — I5022 Chronic systolic (congestive) heart failure: Secondary | ICD-10-CM

## 2014-07-11 DIAGNOSIS — Z95810 Presence of automatic (implantable) cardiac defibrillator: Secondary | ICD-10-CM | POA: Insufficient documentation

## 2014-07-11 DIAGNOSIS — Z79899 Other long term (current) drug therapy: Secondary | ICD-10-CM | POA: Diagnosis not present

## 2014-07-11 DIAGNOSIS — I10 Essential (primary) hypertension: Secondary | ICD-10-CM | POA: Insufficient documentation

## 2014-07-11 DIAGNOSIS — I50810 Right heart failure, unspecified: Secondary | ICD-10-CM

## 2014-07-11 DIAGNOSIS — I480 Paroxysmal atrial fibrillation: Secondary | ICD-10-CM | POA: Diagnosis not present

## 2014-07-11 LAB — PRO B NATRIURETIC PEPTIDE: Pro B Natriuretic peptide (BNP): 505.6 pg/mL — ABNORMAL HIGH (ref 0–450)

## 2014-07-11 LAB — BASIC METABOLIC PANEL
Anion gap: 15 (ref 5–15)
BUN: 19 mg/dL (ref 6–23)
CO2: 20 mEq/L (ref 19–32)
Calcium: 9.1 mg/dL (ref 8.4–10.5)
Chloride: 104 mEq/L (ref 96–112)
Creatinine, Ser: 1.48 mg/dL — ABNORMAL HIGH (ref 0.50–1.10)
GFR calc Af Amer: 38 mL/min — ABNORMAL LOW (ref >90)
GFR calc non Af Amer: 33 mL/min — ABNORMAL LOW (ref >90)
Glucose, Bld: 98 mg/dL (ref 70–99)
Potassium: 4.4 mEq/L (ref 3.7–5.3)
Sodium: 139 mEq/L (ref 137–147)

## 2014-07-11 LAB — LACTATE DEHYDROGENASE: LDH: 297 U/L — ABNORMAL HIGH (ref 94–250)

## 2014-07-11 LAB — PROTIME-INR
INR: 2.24 — ABNORMAL HIGH (ref 0.00–1.49)
Prothrombin Time: 25 seconds — ABNORMAL HIGH (ref 11.6–15.2)

## 2014-07-11 LAB — CBC
HCT: 36.3 % (ref 36.0–46.0)
Hemoglobin: 11.5 g/dL — ABNORMAL LOW (ref 12.0–15.0)
MCH: 28.8 pg (ref 26.0–34.0)
MCHC: 31.7 g/dL (ref 30.0–36.0)
MCV: 90.8 fL (ref 78.0–100.0)
Platelets: 218 10*3/uL (ref 150–400)
RBC: 4 MIL/uL (ref 3.87–5.11)
RDW: 16.7 % — ABNORMAL HIGH (ref 11.5–15.5)
WBC: 6 10*3/uL (ref 4.0–10.5)

## 2014-07-11 MED ORDER — GABAPENTIN 100 MG PO CAPS: ORAL_CAPSULE | ORAL | Status: DC

## 2014-07-11 MED ORDER — LOSARTAN POTASSIUM 50 MG PO TABS: ORAL_TABLET | ORAL | Status: DC

## 2014-07-11 NOTE — Progress Notes (Signed)
Patient ID: Brianna Hanson, female   DOB: 1934-12-01, 79 y.o.   MRN: [id number] PCP: Dr. Scarlette Hanson  HPI: Ms Brianna Hanson is a 79 year old female with a PMH of HF due to severe NICM, chronic systolic HF,  PAF,  plasma cell disorder (Likely IgA MGUS) - saw Dr. Alen Hanson on 08/12/13 for routine follow up; no complications, plans to see her in 6 mos and repeat protein studies. Underwent implantation of the HeartMate II LVAD on 04/06/13. She is not on aspirin due to dizziness.   Follow up for Heart Failure: Doing well. Denies SOB, orthopnea, PND, CP or dizziness.  Mild fatigue. Able to walk 100 yards 3 times a week. On 11/7 she had lower abdominal pain that lasted about 24 hours. She said that it was relieved  with 2 pain pills. She has not had further symptoms. Having regular bowel movements. Dressing changes every week. Taking all medications. Has not needed diuretics.   ECHO (03/2014): EF 55% and RV slightly down,   Denies LVAD alarms.  Denies driveline trauma, erythema or drainage.  Denies ICD shocks.  Reports taking Coumadin as prescribed.  Denies bright red blood per rectum or melena, no dark urine or hematuria.     Past Medical History  Diagnosis Date  . Cardiac arrest - ventricular fibrillation 12/10    with successful resucitation, S/p ICD  . ICD (implantable cardiac defibrillator) in place     she has received appropriate therapy for VF  . Atrial fibrillation or flutter   . Osteopenia   . HTN (hypertension)     moderate  . Nonischemic cardiomyopathy     followed by Dr Brianna Hanson at [facility]  . CHF (congestive heart failure)   . Anemia   . S/P colonoscopy   . Plasma cell disorder 03/20/2012  . Plasma cell disorder 03/20/2012    Current Outpatient Prescriptions  Medication Sig Dispense Refill  . amiodarone (PACERONE) 200 MG tablet TAKE 1 TABLET BY MOUTH EVERY DAY 30 tablet 5  . amLODipine (NORVASC) 10 MG tablet TAKE 1 TABLET BY MOUTH TWICE A DAY 60 tablet 6  . carvedilol (COREG) 12.5 MG tablet  Take 1.5 tabs twice daily 90 tablet 6  . Cholecalciferol (VITAMIN D3) 1000 UNITS CAPS Take 1 tablet by mouth daily.      [name] gabapentin (NEURONTIN) 100 MG capsule TAKE ONE CAPSULE BY MOUTH AT BEDTIME OR AS DIRECTED 30 capsule 2  . hydrALAZINE (APRESOLINE) 100 MG tablet TAKE 1 TABLET BY MOUTH 3 TIMES A DAY 90 tablet 6  . losartan (COZAAR) 50 MG tablet TAKE 1 TABLET BY MOUTH EVERY DAY 30 tablet 5  . pantoprazole (PROTONIX) 40 MG tablet TAKE 1 TABLET BY MOUTH EVERY DAY 30 tablet 6  . spironolactone (ALDACTONE) 25 MG tablet TAKE 1 TABLET BY MOUTH EVERY DAY 30 tablet 6  . traMADol (ULTRAM) 50 MG tablet TAKE 1 OR 2 TABLETS BY MOUTH EVERY 6 HOURS AS NEEDED FOR PAIN 60 tablet 0  . warfarin (COUMADIN) 2.5 MG tablet Take one tab daily or as directed for INR 2.0 - 2.5 45 tablet 6  . torsemide (DEMADEX) 20 MG tablet PRN weight gain 3 lbs in 24 hours     No current facility-administered medications for this encounter.    Review of patient's allergies indicates no known allergies.  REVIEW OF SYSTEMS: All systems negative except as listed in HPI, PMH and Problem list.  Filed Vitals:   07/11/14 1016  BP: 92/0  Pulse: 60  Weight:  183 lb (83.008 kg)  SpO2: 97%    LVAD parameters: Flow:  4.7 Speed:  9200 PI:  7.1  Power:  5.2 Alarms:  None Events:  10   PI events noted on 07/09/14 usually has 3-4 PI events.  Fixed speed:  9200 Low speed limit:  8600  I  reviewed the LVAD parameters from today, and compared the results to the patient's prior recorded data.  No programming changes were made.  The LVAD is functioning within specified parameters.  The patient performs LVAD self-test daily.  LVAD interrogation was negative for any significant power changes, alarms or PI events/speed drops.  LVAD equipment check completed and is in good working order.  Back-up equipment present.   LVAD education done on emergency procedures and precautions and reviewed exit site care.   Physical Exam: MAP 92  GENERAL:  Well appearing, female, NAD, Brianna Hanson present HEENT: normal  NECK: Supple, JVP 5-6,  2+ bilaterally, no bruits.  No lymphadenopathy or thyromegaly appreciated.   CARDIAC:  LVAD hum present.  LUNGS:  Clear to auscultation bilaterally.  ABDOMEN:  Soft, round, nontender, positive bowel sounds x4.     LVAD exit site: Dressing dry and intact.  No erythema or drainage.  Stabilization device present and accurately applied. Driveline dressing changed weekly. EXTREMITIES:  Warm and dry, no cyanosis, clubbing, rash, edema  NEUROLOGIC:  Alert and oriented x 4.  Gait steady.  No aphasia.  No dysarthria.  Affect pleasant.      ASSESSMENT AND PLAN:  1) Chronic systolic HF: NICM, s/p ICD and LVAD (04/2013) - Overall she doing well. NYHA II symptoms and volume status stable. Continue torsemide as needed. Continue 25 mg spironolactone daily. -Continue carvedilol 18.75 mg twice a day -Continue losartan 50 mg daily  -Continue hydralazine 100 mg three times a day.   - MAP Ok.  - Continue weekly dressing changes.  Will continue current medications and continue to follow.  - Reinforced the need and importance of daily weights, a low sodium diet, and fluid restriction (less than 2 L a day). Instructed to call the HF clinic if weight increases more than 3 lbs overnight or 5 lbs in a week.  2) LVAD - All parameters stable. - Check LDH, INR, CBC and BMET today.  - She is not on ASA d/t dizziness.   -Weekly dressing changes.  VAD interrogated. No alarms but had several PI events noted 07/09/14 associated with abdominal pain. This has resolved.  3) PAF - Stable. Continue Amio 200 mg daily.  Had TSH LFTs  05/2014.  4) HTN -Stable. Continue current regimen. Losartan 50 mg daily, hydralazine 100 mg three times a day, and carvedilol 18.75 mg twice a day.   5) Anticoagulation - Check INR and if remains stable will continue q 3 week INRs. As above no ASA d/t dizziness.  6) Abdominal Pain - noted on November 7th with 10 PI  events. Asymptomatic today but if reoccurs will need CT of abdomen. Discussed with Dr Brianna Hanson.     Follow up in 2 months  CLEGG,AMY, NP-C 10:33 AM

## 2014-07-11 NOTE — Progress Notes (Signed)
Symptom  Yes  No  Details   Angina        x Activity:   Claudication        x How far:   Syncope        x When:   Stroke        x   Orthopnea        x How many pillows:  2  PND        x How often:  CPAP      N/A How many hrs:   Pedal edema        x       clears overnight; not taking prn torsemide  Abd fullness        x   N&V        x   Diaphoresis        x          When:  Occasional night sweat   Bleeding       x   Urine color   Light yellow  SOB        x Activity:   Palpitations        x When:  ICD shock        x   Hospitlizaitons        x When/where/why:  ED visit        x When/where/why:  Other MD              x When/who/why:   Activity    Walk 1 mile 2x week  Fluid    No limitaions  Diet    No added salt   MAP:  92 HR: 60 SPO2: 97 Weight:  184.6  lbs Last weight:  183.2  lbs   LVAD interrogation reveals:  Speed:  9200 Flow:  4.0 Power:  5.0 PI:  6.8 Alarms:  none Events:   0 - 5  PI events  Fixed speed:  9200 Low speed limit:  8600 Primary Controller:  Replace back up battery in 20 months (Jan/2017) Back up controller:   Replace back up battery in 20 months (Jan/2017)   I reviewed the LVAD parameters from today, and compared the results to the patient's prior recorded data. No programming changes were made. The LVAD is functioning within specified parameters. LVAD interrogation was negative for any significant power changes, alarms or PI events/speed drops.  LVAD equipment check completed and is in good working order. Back-up equipment present. LVAD education done on emergency procedures and precautions and reviewed exit site care.    LVAD exit site: Well healed and incorporated. The velour is fully implanted at exit site. Dressing dry and intact. No erythema or drainage. Stabilization device present and accurately applied. Driveline dressing is being changed weekly per sterile technique using Sorbaview dressing with biopatch on exit site. Pt denies fever or  chills. Pt/caregiver state they have adequate dressing supplies at home.

## 2014-07-11 NOTE — Patient Instructions (Signed)
1.  No change in coumadin dose; re-check INR in three weeks on 08/01/14. 2.  Return to [facility] in 2 months.

## 2014-07-16 NOTE — Clinical Social Work Psychosocial (Addendum)
[facility] CLINICAL SOCIAL WORK DOCUMENTATION LVAD (Left Ventricular Assist Device) Psychosocial Screening Please remember that all information is confidential within the members of the [place] team and [facility]  03/16/2013  3:55:52 PM  Patient:  Brianna Hanson  MRN:  [id number]  Account:  [id number]  Clinical Social Worker:  Butch Penny Modean Mccullum, LCSW Date/Time Initiated:   03/16/2013 03:50 PM Referral Source:    [name], [name], NP  Referral Reason:   LVAD Placement  Source of Information:   Chatr, Patient, [place]   PATIENT DEMOGRAPHICS NAME:   Brianna Hanson     DOB:  [birth year]  SS#:  [id number] Address:   [place] [address] Home Phone:  [phone]  Cell Phone:   [phone] Marital Status:   Single     Primary Language:  ENGLISH  Faith:  NON-DENOMINATIONAL Adherence with Medical Regimen:   Excellent  Medication Adherence:   Excellent  Physician appointment attendance:   Excellent  "I don't miss my appointments unless I'm very sick"   Do you have a Living Will or Medical POA?  N Would you like to complete a Living Will and Medical POA prior to surgery?  N Are you currently a DNR?  N Do you have a MOST form?  N Would you like to review one?  N Do you have goals of care?  N   Have you had a consult with the palliative care team at [facility]?  N Comment:   Psychological Health Appearance:   Neat, clean, appropriate  Mental status:   Alert and oriented  Eye Contact:   Excellent and appropraite  Thought Content:   Coherent, clear and rational  Speech:   Normal rate, tone and volume. Able to articulate well.  Hanson:   Upbeat and positive Hanson  Affect:   Appropriate for Hanson and situation  Insight:   Good. She hopes for more education  Judgment:   Good  Interaction Style:   Good. She hopes for more education   Family/Social Information Who lives in your home? Name9  Brianna  Hanson    Relationship to you9  Significant other    Do you have a plan for child care if relevant?   NA  List family members outside the home (parents, Hanson, pastor, etc..) Name2  Son    Relationship to [employer]    Please list people who give you emotional support (family, parents, Hanson, pastor, etc..) Brianna Hanson      Who is your primary and backup support pre and post-surgery? Explain the relationships i.e. strengths/weakness, etc.:   Brianna Hanson  - Retired and available 24/7 to assist patient  Secondary Caregiver identified:   Son: Brianna Hanson- invoved and supportive   [phone])   Legal Do you currently have any legal issues/problems?   No  Durable POA or Legal Next of Kin:   Legal NOK is son Brianna Hanson. No Durable POA  [phone]   Living situation Travel distance to [facility]:  5 miles from the hospital  Second Hand Smoke Exposure:   No  Self- Care level:   Independent of her ADL's but tires easily  Ambulation:   Independent  Transportation:   Significant other- [name] has a car and drivers her where she needs to go. Son also has a car. Patient has not driven since 8315.  Limitations:   None at this  time  Barriers impacting ability to participate in care:   None   Community Are you active with community agencies/resources?   No  Are you active in a church, synagogue, mosque, or other faith based community?   Yes-  [facility] [place] and [facility]  What other sources do you have for spiritual support?   Prayers books, Woman's support group at [employer] and reading the [employer] you active in any clubs or social organizations?   Just church activities  What do you do for fun? hobbies, interests?  family get togethers. No specifid hobbies.   [name] loves to cook and "fix" things   Education/Work information What is the last grade of school you completed?  12th  graduated  Preferred method of learning?  (Written, verbal, hands on):   Oral  and actually "doing" the task (Hands on)  Do you have any problems with reading or writing?  No  Are you currently employed? If no, when were you last employed?   No. Not worked since 1999  Name of employer:   NA  Please describe what kind of work you do/did?   NA  How long have you worked there?   NA  If you are not currently working, do you plan to return to work after VAD surgery?   No  If yes, what type of employment do you hope to find?   NA  Are you interested in job training or learning new skills?   No  Did you serve in the Military? If so, what branch?   No   Financial Information What is your source of income?    Social security- around $725.00/month  Retirment $458.00  Do you have difficulty meeting your monthly expenses?   No- "We manage ok"  If yes, which ones?    NA  How do you usually cope with this?    NA  Primary Health Insurance:    [employer]  Secondary Insurance:    None  Have you applied for Medicaid?    No  Have you applied for Social Security Disability?    Already has SS disability  Do you have prescription coverage?    Yes  What are your prescription co-pays?    Depends on the meds but they are usually affordable  Are you required to use a certain pharmacy?    [facility]  Do you have a mail order option for your prescriptions?    "No- I don't think so- but even if I did- i [facility]"  If yes, what pharmacy do you use for mail order?    NA  Have you ever refused medication due to cost?    No  Discuss monthly cost for dressing supplies post procedure $150-300    Discussed and reviewed. VAD coordinator will further discuss  Can you budget for this monthly expense?    Yes   Medical Information Briefly describe your medical history, surgeries and why you are here for evaluation.    Pateint states that she had a heart attack in 2020 and had an ICD placed.  She has been dealing with heart issues for many  years including CHF and Atrial Fibrllation.  "It seems like something new all the time" and states that she has been resucitated in the past and underwent Cardiac Catherization.  She is a priror smoker but quit along time ago.  She had to have a PICC removed dur to infection- s/p Milrinone. She states "they are wanting  to put a VAD in me."  Are you able to complete your ADL's?    Yes- mostly indpendent.  Landis Gandy helps her if needed.  Do you have any history of emotional, medical, physical or verbal trauma?    No  Do you have any family history of heart problems?    Father had a heart attack and died  Mother had angina and a stroke  Brother- Stroke  Do you smoke? If so, what is the amount and frequency?    No- doesn't smoke now but was a past smoker  [name]: non-smoker  Do you drink alcohol? If so, how many drinks a day/week?    Yes- occasionally  (once a week)  [name]- No alcohol  Have you ever used illegal drugs or misused medications?    No for patient or friend  If yes, what drugs do you use and how often?    NA  Have you ever been treated for substance abuse?    No for patient or friend  If yes, where and when were you treated?    NA  Are you currently using illegal substances?    No   Mental Health History Have you ever had problems with depression, anxiety or other mental health issues?   No- never  If yes, have you seen a counselor, psychiatrist or therapist?    No  If you are currently experiencing problems are you interested in talking with a professional?    No  (PHQ-2=0)  Would you be interested in participating in an LVAD support group?  Y LVAD support group for: Patient Caregivers caregivers Have you or are you taking any medications for anxiety/depression or any mental health concerns?    No  If yes, Please list the medications?    NA  How have you been feeling in the past year?    Patient reports that her medical condition has deteriorated in the past year.  She has become more short of breath and having difficulties with fluid retention.  She was on the heart transplant list but has not "aged out". She states that Dr. Rayann Heman and now Dr. Missy Sabins have been working hard to get her feeling better.  (Current EF is 15%)  How do you handle stressful situations?   Patient states she tries to keep calm and not "stress about the little things"  She feels that her significant other- [name] is her greatest supporter and Multimedia programmer. She also relies on her family and church. Feels that she can talk to the GOD and he helps her to find serenity as well.  [name] states that he loves to travel and cook.He talks to himself and makes a conscious decision not to dwell on stressful situations.  What are your coping strategies? Please List:    Travel, cooking, listening to music  Have you had any past or current thoughts of suicide?    No  How many hours do you sleep at night?    Somedays (when really tired)- will sleep about 10 hours and she feels she sleeps good  How is your appetite?   She admits that her appetite "comes and goes"- not always hungry- especially for lunch  PHQ2- Depression Screen:    0  PHQ9 Depression screen (only complete if the PHQ2 is positive):    NA   Plan for VAD Implementation Do you know and understand what happens during VAD surgery?  Please describe your thoughts:   Patient was able to verbalize  basic knowledge of the LVAD process, surgery and aftercare.  Her understanding of the medical, emotional and social issues apper to be realistic. She is hoping to receive more education as she moves towards implantation.  [name] was able to describe what the pump does and how it is attached to the heart. He also was able to verbalize a strong understanding of the caregiver issues that will be required after surgery.  What do you know about the risks of any major surgery or use of general anesthesia?    Per patient "any surgery is risky and when you're as  sick as me- anything can happen. They told me there is a risk of death from surgery and I understand this."  What do you know about the risks and side effects associated with VAD surgery?    Same response as above. She stated that she has been told of the risks and benefits of the surgery and knows that this is the best chance she has of getting better.  [name] verbalized the same thoughts  Explain what will happen right after surgery?    "I know I'll be in ICU and I will have to be on a breathing machine for a while".  They said I would move later to another place as I recover. The doctor said that I would hopefully come off the breathing machine pretty soon."  [name] stated knowledge of the above and wanted to make sure that she was as free from pain as possible.  Both patient and significant other have realistic expectations of the surgery and after care.  Information obtained from:    MD, LVAD coordinator  What is your plan for transportation for the first 8 weeks post-surgery? (Patients are not recommended to drive post-surgery for 8 weeks)    [name] will be her designated driver.  Son Cira Rue is also available as needed.  Patient has not driven for past 4 years.  Driver: Brianna Hanson and Son Geraldyn Shain  Valid license:    They both have licenses. Patient has a driver's license but does not drive  Working Conservator, museum/gallery:    1997 Thurmderbird and 2009 Volkswagon  Airbags:  Both vehicles have front and side door airbags Do you plan to disarm the airbags- there is a risk of discharging the device if the airbag were to deploy.   "we don't know-  Should we?"  CSW discussed risks of implosions from the airbags. Encouraged back seat transportation for patient as much as possible if they decide not to disarm the airbags.  What do you know about your diet post-surgery?    Patient states that she's alreaady on a heart health diet and she tries to watch what she eats.  "I know I'll have to  be really strict"  [place] understanding of same and he cooks for patient and is very aware of her special needs.  They are both open to having a dietician talk to them if needed.  How do you plan to monitor your medications, current and future?    [name] will help her with medications until she feels strong enough to do for herself. She normally monitors her own meds  How do you plan to complete ADL's post-surgery Sales executive, dress, etc.)?    [name] will help her with all ADL needs  Will it be difficult to ask for help for your caregivers? If so, explain:    Patient denies any diffiicult with this as she feels that she and  [name] help one another all the time. He confirms this. She does want to be as independent as possible.  Please explain what you hope will be improved about your life as a result of receiving the VAD    "I want to feel better again- and not be tired all the time.  I want to be able to travel and be as independent as possible. I've not felt good for a long time."  Please tell me your biggest concern or fear about receiving the VAD?    Patient: "That i could die in surgery or that it won't make me feel better."  [name]: "I just want her to be stronger and not have to feel so bad."  How do you cope with your concerns/fears?    Per patient- [name] is her greatest supporter and he keeps her in a positive frame of mind.  "We just have to deal with this the best that we can"  Please explain your understanding of how your body will change? Are you worried about these changes?    Paitent states that she isn't worried about any body changes but is able to to verbalize an understanding about the need for the batteries and the drive line.  "None of that will change how i feel about myself.  [name]:  "It won't bother me."  Do you see any barriers to your surgery or follow up? If yes, please explain:   No- not at this time     Understanding of LVAD Surgical procedures and risks:     Discussed and Reviewed  Electrical need for LVAD:    Discussecd and reviewed- their home as 3 prong plugs and both patient and [name] were able to verabilize a strong understanding of keeping the batteries charged and having an emergency plan.  Safety precautions with LVAD (Water, etc.):   Discussed and reviewed- No swimming, bath tubs etc  Potential side effects with LVAD:    Discussed and Reviewed  Types of Advanced heart failure therapies available:    None at this time- Patient relates she has "aged out" for transplant  LVAD daily self-care (dressing changes, computer check, extra supplies):    Discussed and Reviewed  Outpatient follow-up (follow-up in LVAD clinic; monitoring blood thinners)    Discussed and reviewed. LVAD coordinator will follow up on thes issues  Need for emergency planning:    Safety plan in place for emergency electrical needs and care  Expectations for LVAD:    Patient and significant other verbalize strong, accurate and realistic expectations for LVAD  Current level of motivation to prepare for LVAD:    Strong motivation noted for both patient and caregiver  Patient's perception of need for LVAD:    Accureate and realistic  Present level of consent for LVAD:    Patient gives full and clear consent for the LVAd  Reasons for seeking LVAD:    "I want to feel better and have a more normal life"   Psychosocial Protective Factors  Strong support from her significant other [name], Son and other family members Good coping skills Significant other is in good health -mentally and physicaly No drug or mental health history Patient is motivated and utilzes strong coping skills; she is realistic about surgery process and possible outcomes. Good transportation and back up plans as needed. Strong faith base and willingness to seek help and support as needed from others Psychosocial Risk Factors  Patient's age and potential for recovery  Clinical Intervention: CSW will  be invovled during hospitalization should surgery process proceed. No intervention at this time.   Educated patient/family on the following Caregiver(s) role responsibilities:   Discussed and Reviewed. Ongoing support after surgery  Financial planning for LVAD:    Discussed and Reviewed. Ongoing support as needed after surgery  Role of Clinical Social Worker:    Discussed and Reviewed  Signs of depression and anxiety:    None noted  Support planning for LVAD:    Strong suport noted by significant caregiver and  LVAD process:    Discussed and Reviewed.  LVAD process is supported further by the LAD coordinator  Caregiver contract/agreement:   Discussed and Reviewed:  Information and contract provided by LVAD coordinator  Discussed Referral(s) to:    Discussed interest in future LVAD support group and she is interested.  Further referrals will be made as needed during recovery process  Community Resources:    None at this time.  Will further assess throughout recovery period in hospital  Clinical Impression Recommendations:    CSW notes that patient will be an excellent candidate for LVAD implantation.  She has strong support from her significant other and family. She is able to to express good coping strategies as well as  very good understanding of the VAD process and recovery issues.  Pateint appears motivated and willing to learn as this process proceeds. Her significant other is very invovled and informed. She trusts him to continue to provide support.  Patient states that she wants to get to feeling better and is hopeful for a positive outcome with this surgery. CSW will recommend patient to proceed with LVAD workup.

## 2014-08-01 ENCOUNTER — Ambulatory Visit (INDEPENDENT_AMBULATORY_CARE_PROVIDER_SITE_OTHER): Payer: Medicare Other | Admitting: *Deleted

## 2014-08-01 ENCOUNTER — Other Ambulatory Visit (INDEPENDENT_AMBULATORY_CARE_PROVIDER_SITE_OTHER): Payer: Medicare Other

## 2014-08-01 ENCOUNTER — Encounter: Payer: Self-pay | Admitting: Internal Medicine

## 2014-08-01 ENCOUNTER — Ambulatory Visit (HOSPITAL_COMMUNITY): Payer: Self-pay | Admitting: *Deleted

## 2014-08-01 DIAGNOSIS — Z7901 Long term (current) use of anticoagulants: Secondary | ICD-10-CM

## 2014-08-01 DIAGNOSIS — I429 Cardiomyopathy, unspecified: Secondary | ICD-10-CM

## 2014-08-01 DIAGNOSIS — I4901 Ventricular fibrillation: Secondary | ICD-10-CM | POA: Diagnosis not present

## 2014-08-01 DIAGNOSIS — Z95811 Presence of heart assist device: Secondary | ICD-10-CM

## 2014-08-01 LAB — PROTIME-INR
INR: 2.3 ratio — ABNORMAL HIGH (ref 0.8–1.0)
Prothrombin Time: 25.4 s — ABNORMAL HIGH (ref 9.6–13.1)

## 2014-08-02 NOTE — Progress Notes (Signed)
Remote ICD transmission.   

## 2014-08-04 LAB — MDC_IDC_ENUM_SESS_TYPE_REMOTE
Battery Remaining Longevity: 51 mo
Battery Remaining Percentage: 51 %
Battery Voltage: 2.93 V
Brady Statistic AP VP Percent: 1 %
Brady Statistic AP VS Percent: 33 %
Brady Statistic AS VP Percent: 1 %
Brady Statistic AS VS Percent: 66 %
Brady Statistic RA Percent Paced: 32 %
Brady Statistic RV Percent Paced: 1 %
Date Time Interrogation Session: 20151130074735
HighPow Impedance: 41 Ohm
Implantable Pulse Generator Serial Number: 754815
Lead Channel Impedance Value: 280 Ohm
Lead Channel Impedance Value: 290 Ohm
Lead Channel Pacing Threshold Amplitude: 0.75 V
Lead Channel Pacing Threshold Amplitude: 1 V
Lead Channel Pacing Threshold Pulse Width: 0.5 ms
Lead Channel Pacing Threshold Pulse Width: 0.5 ms
Lead Channel Sensing Intrinsic Amplitude: 0.9 mV
Lead Channel Sensing Intrinsic Amplitude: 9.7 mV
Lead Channel Setting Pacing Amplitude: 2 V
Lead Channel Setting Pacing Amplitude: 2.5 V
Lead Channel Setting Pacing Pulse Width: 0.5 ms
Lead Channel Setting Sensing Sensitivity: 0.5 mV
Zone Setting Detection Interval: 300 ms
Zone Setting Detection Interval: 350 ms

## 2014-08-09 ENCOUNTER — Telehealth: Payer: Self-pay | Admitting: Oncology

## 2014-08-09 ENCOUNTER — Encounter: Payer: Self-pay | Admitting: Cardiology

## 2014-08-09 ENCOUNTER — Other Ambulatory Visit (HOSPITAL_BASED_OUTPATIENT_CLINIC_OR_DEPARTMENT_OTHER): Payer: Medicare Other

## 2014-08-09 ENCOUNTER — Ambulatory Visit (HOSPITAL_BASED_OUTPATIENT_CLINIC_OR_DEPARTMENT_OTHER): Payer: Medicare Other | Admitting: Oncology

## 2014-08-09 VITALS — BP 88/66 | HR 60 | Temp 97.6°F | Resp 20 | Ht 60.0 in | Wt 185.6 lb

## 2014-08-09 DIAGNOSIS — Z23 Encounter for immunization: Secondary | ICD-10-CM | POA: Diagnosis not present

## 2014-08-09 DIAGNOSIS — D472 Monoclonal gammopathy: Secondary | ICD-10-CM | POA: Diagnosis not present

## 2014-08-09 DIAGNOSIS — I4891 Unspecified atrial fibrillation: Secondary | ICD-10-CM

## 2014-08-09 LAB — CBC WITH DIFFERENTIAL/PLATELET
BASO%: 0.6 % (ref 0.0–2.0)
Basophils Absolute: 0 10*3/uL (ref 0.0–0.1)
EOS%: 2.8 % (ref 0.0–7.0)
Eosinophils Absolute: 0.2 10*3/uL (ref 0.0–0.5)
HCT: 33.6 % — ABNORMAL LOW (ref 34.8–46.6)
HGB: 10.9 g/dL — ABNORMAL LOW (ref 11.6–15.9)
LYMPH%: 26.1 % (ref 14.0–49.7)
MCH: 28.8 pg (ref 25.1–34.0)
MCHC: 32.3 g/dL (ref 31.5–36.0)
MCV: 89.1 fL (ref 79.5–101.0)
MONO#: 0.8 10*3/uL (ref 0.1–0.9)
MONO%: 11.8 % (ref 0.0–14.0)
NEUT#: 3.9 10*3/uL (ref 1.5–6.5)
NEUT%: 58.7 % (ref 38.4–76.8)
Platelets: 209 10*3/uL (ref 145–400)
RBC: 3.77 10*6/uL (ref 3.70–5.45)
RDW: 17.2 % — ABNORMAL HIGH (ref 11.2–14.5)
WBC: 6.7 10*3/uL (ref 3.9–10.3)
lymph#: 1.8 10*3/uL (ref 0.9–3.3)

## 2014-08-09 LAB — COMPREHENSIVE METABOLIC PANEL (CC13)
ALT: 25 U/L (ref 0–55)
AST: 25 U/L (ref 5–34)
Albumin: 3.1 g/dL — ABNORMAL LOW (ref 3.5–5.0)
Alkaline Phosphatase: 106 U/L (ref 40–150)
Anion Gap: 10 mEq/L (ref 3–11)
BUN: 17 mg/dL (ref 7.0–26.0)
CO2: 22 mEq/L (ref 22–29)
Calcium: 8.9 mg/dL (ref 8.4–10.4)
Chloride: 108 mEq/L (ref 98–109)
Creatinine: 1.4 mg/dL — ABNORMAL HIGH (ref 0.6–1.1)
EGFR: 41 mL/min/{1.73_m2} — ABNORMAL LOW (ref >90)
Glucose: 84 mg/dl (ref 70–140)
Potassium: 4 mEq/L (ref 3.5–5.1)
Sodium: 139 mEq/L (ref 136–145)
Total Bilirubin: 0.41 mg/dL (ref 0.20–1.20)
Total Protein: 7.5 g/dL (ref 6.4–8.3)

## 2014-08-09 NOTE — Progress Notes (Signed)
Hematology and Oncology Follow Up Visit  Brianna Hanson [id number] December 18, 1934 79 y.o. 08/09/2014 9:51 AM     Principle Diagnosis: 79 year old with plasma cell disorder. She presented with IgA MGUS without evidence of multiple myeloma diagnosed in 2013.  Current therapy: Observation and surveillance.   Secondary diagnosis: Systolic heart failure currently on LVAD.  Interim History: Mrs. Brianna Hanson returns today for a follow up visit. Since her last visit, she continues to do well. She remains on LVAD for her heart failure without any issues or hospitalizations. She is doing well and adjusting her lifestyle to this. This continues to improve her quality of life dramatically. She is reporting slight back discomfort related to carrying the device around.  She is not reporting any pathological fracture. Has not reported any change in her activity level or performance status. She is rather functional and able to perform most activities of daily living without any hindrance or decline. She had not had any peripheral neuropathy. Had not reported any immune dysregulation or recurrent sinopulmonary infection. He does not report any headaches blurred vision no vision. For that report any chest pain shortness of breath cough or hemoptysis. She does report lower extremity edema which is chronic. She has not reported any hemoptysis or hematemesis. She does not report any nausea or vomiting or abdominal pain. Has not reported any frequency urgency or hesitancy. Rest of her review of system is unremarkable.  Medications: I have reviewed the patient's current medications. Current Outpatient Prescriptions  Medication Sig Dispense Refill  . amiodarone (PACERONE) 200 MG tablet TAKE 1 TABLET BY MOUTH EVERY DAY 30 tablet 5  . amLODipine (NORVASC) 10 MG tablet TAKE 1 TABLET BY MOUTH TWICE A DAY 60 tablet 6  . carvedilol (COREG) 12.5 MG tablet Take 1.5 tabs twice daily 90 tablet 6  . Cholecalciferol (VITAMIN D3) 1000 UNITS  CAPS Take 1 tablet by mouth daily.      [name] gabapentin (NEURONTIN) 100 MG capsule TAKE ONE CAPSULE BY MOUTH AT BEDTIME OR AS DIRECTED 30 capsule 6  . hydrALAZINE (APRESOLINE) 100 MG tablet TAKE 1 TABLET BY MOUTH 3 TIMES A DAY 90 tablet 6  . losartan (COZAAR) 50 MG tablet TAKE 1 TABLET BY MOUTH EVERY DAY 30 tablet 6  . pantoprazole (PROTONIX) 40 MG tablet TAKE 1 TABLET BY MOUTH EVERY DAY 30 tablet 6  . spironolactone (ALDACTONE) 25 MG tablet TAKE 1 TABLET BY MOUTH EVERY DAY 30 tablet 6  . torsemide (DEMADEX) 20 MG tablet PRN weight gain 3 lbs in 24 hours    . traMADol (ULTRAM) 50 MG tablet TAKE 1 OR 2 TABLETS BY MOUTH EVERY 6 HOURS AS NEEDED FOR PAIN 60 tablet 0  . warfarin (COUMADIN) 2.5 MG tablet Take one tab daily or as directed for INR 2.0 - 2.5 45 tablet 6   No current facility-administered medications for this visit.    Allergies: No Known Allergies  Past Medical History, Surgical history, Social history, and Family History were reviewed and updated.  Physical Exam: Blood pressure 88/66, pulse 60, temperature 97.6 F (36.4 C), temperature source Oral, resp. rate 20, height 5' (1.524 m), weight 185 lb 9.6 oz (84.188 kg). ECOG: 1 General appearance: alert awake not in any distress. Head: Normocephalic, without obvious abnormality, atraumatic Neck: no adenopathy Lymph nodes: Cervical, supraclavicular, and axillary nodes normal. Heart:regular rate and rhythm, S1, S2 normal, no murmur, click, rub or gallop Lung:chest clear, no wheezing, rales, normal symmetric air entry Abdomin: soft, non-tender, without masses or  organomegaly EXT:no erythema, induration, or nodules. Trace edema noted.   Lab Results: Lab Results  Component Value Date   WBC 6.7 08/09/2014   HGB 10.9* 08/09/2014   HCT 33.6* 08/09/2014   MCV 89.1 08/09/2014   PLT 209 08/09/2014     Chemistry      Component Value Date/Time   NA 139 07/11/2014 1004   NA 141 02/08/2014 0913   K 4.4 07/11/2014 1004   K 4.2  02/08/2014 0913   CL 104 07/11/2014 1004   CL 110* 02/10/2013 1053   CO2 20 07/11/2014 1004   CO2 25 02/08/2014 0913   BUN 19 07/11/2014 1004   BUN 21.6 02/08/2014 0913   CREATININE 1.48* 07/11/2014 1004   CREATININE 1.4* 02/08/2014 0913      Component Value Date/Time   CALCIUM 9.1 07/11/2014 1004   CALCIUM 9.0 02/08/2014 0913   ALKPHOS 116 05/10/2014 0921   ALKPHOS 122 02/08/2014 0913   AST 27 05/10/2014 0921   AST 25 02/08/2014 0913   ALT 25 05/10/2014 0921   ALT 24 02/08/2014 0913   BILITOT 0.3 05/10/2014 0921   BILITOT 0.41 02/08/2014 0913       Results for Brianna Hanson (MRN [id number]) as of 08/09/2014 09:34  Ref. Range 02/10/2013 10:53 08/12/2013 08:53 02/08/2014 09:13  M-SPIKE, % No range found 0.71 1.01 1.01  SPE Interp. No range found * * *  IgG (Immunoglobin G), Serum Latest Range: [phone] mg/dL 1180 1320 1240  IgA Latest Range: 69-380 mg/dL 696 (H) 1440 (H) 1270 (H)  IgM, Serum Latest Range: 52-322 mg/dL 48 (L) 78 61  Total Protein, Serum Electrophoresis Latest Range: 6.0-8.3 g/dL 6.6 7.5 7.2    Impression and Plan:  A 79 year old woman with the following issues:   1. Monoclonal gammopathy, IgA subtype with an M-spike of 1.0 g/dL, IgA level of 746. The differential diagnosis include multiple myeloma  vs. monoclonal gammopathy of undetermined significance.  She has no end organ damage to suggest multiple myeloma but she does have a risk of developing that in the future.This risk is slightly higher given the IgA subtype. Her protein studies appear to be stable in the last year or so. I plan on continuing active surveillance and repeat studies in 6 months. She develops major changes in her protein studies, and organ damage or other symptomatology we will stage her with a skeletal survey and a bone marrow biopsy.  2. CHF: she is S/P LVAD placement without complications. This is improving her heart function and tolerating it well.  3. Followup: Will be in 6 months for  repeat protein studies.      EQASTM,HDQQI, MD 12/8/20159:51 AM

## 2014-08-09 NOTE — Telephone Encounter (Signed)
Pt confirmed labs/ov per 12/08 POF, gave pt AVS.... KJ °

## 2014-08-12 LAB — SPEP & IFE WITH QIG
Albumin ELP: 47.9 % — ABNORMAL LOW (ref 55.8–66.1)
Alpha-1-Globulin: 4.7 % (ref 2.9–4.9)
Alpha-2-Globulin: 7.5 % (ref 7.1–11.8)
Beta 2: 21.4 % — ABNORMAL HIGH (ref 3.2–6.5)
Beta Globulin: 5.6 % (ref 4.7–7.2)
Gamma Globulin: 12.9 % (ref 11.1–18.8)
IgA: 1420 mg/dL — ABNORMAL HIGH (ref 69–380)
IgG (Immunoglobin G), Serum: 1230 mg/dL (ref 690–1700)
IgM, Serum: 66 mg/dL (ref 52–322)
M-Spike, %: 0.96 g/dL
Total Protein, Serum Electrophoresis: 7.4 g/dL (ref 6.0–8.3)

## 2014-08-12 LAB — KAPPA/LAMBDA LIGHT CHAINS
Kappa free light chain: 85.7 mg/dL — ABNORMAL HIGH (ref 0.33–1.94)
Kappa:Lambda Ratio: 38.26 — ABNORMAL HIGH (ref 0.26–1.65)
Lambda Free Lght Chn: 2.24 mg/dL (ref 0.57–2.63)

## 2014-08-22 ENCOUNTER — Ambulatory Visit (HOSPITAL_COMMUNITY): Payer: Self-pay | Admitting: Infectious Diseases

## 2014-08-22 ENCOUNTER — Other Ambulatory Visit (INDEPENDENT_AMBULATORY_CARE_PROVIDER_SITE_OTHER): Payer: Medicare Other | Admitting: *Deleted

## 2014-08-22 DIAGNOSIS — Z95811 Presence of heart assist device: Secondary | ICD-10-CM

## 2014-08-22 DIAGNOSIS — Z7901 Long term (current) use of anticoagulants: Secondary | ICD-10-CM | POA: Diagnosis not present

## 2014-08-22 LAB — PROTIME-INR
INR: 2.8 ratio — ABNORMAL HIGH (ref 0.8–1.0)
Prothrombin Time: 30 s — ABNORMAL HIGH (ref 9.6–13.1)

## 2014-09-02 ENCOUNTER — Other Ambulatory Visit (HOSPITAL_COMMUNITY): Payer: Self-pay | Admitting: Internal Medicine

## 2014-09-09 ENCOUNTER — Other Ambulatory Visit (HOSPITAL_COMMUNITY): Payer: Self-pay | Admitting: Infectious Diseases

## 2014-09-09 DIAGNOSIS — Z7901 Long term (current) use of anticoagulants: Secondary | ICD-10-CM

## 2014-09-09 DIAGNOSIS — Z95811 Presence of heart assist device: Secondary | ICD-10-CM

## 2014-09-12 ENCOUNTER — Encounter (HOSPITAL_COMMUNITY): Payer: Self-pay

## 2014-09-12 ENCOUNTER — Ambulatory Visit (HOSPITAL_COMMUNITY): Payer: Self-pay | Admitting: Infectious Diseases

## 2014-09-12 ENCOUNTER — Other Ambulatory Visit (HOSPITAL_COMMUNITY): Payer: Self-pay | Admitting: Infectious Diseases

## 2014-09-12 ENCOUNTER — Ambulatory Visit (HOSPITAL_COMMUNITY)
Admission: RE | Admit: 2014-09-12 | Discharge: 2014-09-12 | Disposition: A | Discharge status: Home / Self Care | Payer: Medicare Other | Source: Ambulatory Visit | Attending: Internal Medicine | Admitting: Internal Medicine

## 2014-09-12 VITALS — BP 88/0 | HR 60 | Wt 185.4 lb

## 2014-09-12 DIAGNOSIS — I5022 Chronic systolic (congestive) heart failure: Secondary | ICD-10-CM | POA: Insufficient documentation

## 2014-09-12 DIAGNOSIS — Z7901 Long term (current) use of anticoagulants: Secondary | ICD-10-CM

## 2014-09-12 DIAGNOSIS — I10 Essential (primary) hypertension: Secondary | ICD-10-CM | POA: Diagnosis not present

## 2014-09-12 DIAGNOSIS — Z95811 Presence of heart assist device: Secondary | ICD-10-CM

## 2014-09-12 DIAGNOSIS — I480 Paroxysmal atrial fibrillation: Secondary | ICD-10-CM

## 2014-09-12 DIAGNOSIS — Z95810 Presence of automatic (implantable) cardiac defibrillator: Secondary | ICD-10-CM | POA: Insufficient documentation

## 2014-09-12 DIAGNOSIS — Z79899 Other long term (current) drug therapy: Secondary | ICD-10-CM | POA: Diagnosis not present

## 2014-09-12 LAB — CBC
HCT: 33.1 % — ABNORMAL LOW (ref 36.0–46.0)
Hemoglobin: 10.7 g/dL — ABNORMAL LOW (ref 12.0–15.0)
MCH: 28.5 pg (ref 26.0–34.0)
MCHC: 32.3 g/dL (ref 30.0–36.0)
MCV: 88.3 fL (ref 78.0–100.0)
Platelets: 204 10*3/uL (ref 150–400)
RBC: 3.75 MIL/uL — ABNORMAL LOW (ref 3.87–5.11)
RDW: 17.2 % — ABNORMAL HIGH (ref 11.5–15.5)
WBC: 5.5 10*3/uL (ref 4.0–10.5)

## 2014-09-12 LAB — BASIC METABOLIC PANEL
Anion gap: 2 — ABNORMAL LOW (ref 5–15)
BUN: 16 mg/dL (ref 6–23)
CO2: 24 mmol/L (ref 19–32)
Calcium: 8.4 mg/dL (ref 8.4–10.5)
Chloride: 114 mEq/L — ABNORMAL HIGH (ref 96–112)
Creatinine, Ser: 1.2 mg/dL — ABNORMAL HIGH (ref 0.50–1.10)
GFR calc Af Amer: 49 mL/min — ABNORMAL LOW (ref >90)
GFR calc non Af Amer: 42 mL/min — ABNORMAL LOW (ref >90)
Glucose, Bld: 91 mg/dL (ref 70–99)
Potassium: 3.4 mmol/L — ABNORMAL LOW (ref 3.5–5.1)
Sodium: 140 mmol/L (ref 135–145)

## 2014-09-12 LAB — PROTIME-INR
INR: 2.45 — ABNORMAL HIGH (ref 0.00–1.49)
Prothrombin Time: 26.8 seconds — ABNORMAL HIGH (ref 11.6–15.2)

## 2014-09-12 LAB — LACTATE DEHYDROGENASE: LDH: 210 U/L (ref 94–250)

## 2014-09-12 NOTE — Progress Notes (Addendum)
  Symptom Yes No Details  Angina  x Activity:  Claudication  x How far:  Syncope  x When:  Stroke  x   Orthopnea  x How many pillows:  PND  x How often:  CPAP  N/A How many hrs:  Pedal edema x  Some swelling noted in legs at the end of the day, gone by the morning.   Abd fullness  x   N&V  x   Diaphoresis x  When: night sweats, normal for   Bleeding  x No blood in stool or bleeding otherwise   Urine  x Clear Yellow   SOB  x Activity:  Palpitations  x When:  ICD shock  x   Hospitlizaitons  x When/where/why:  ED visit  x When/where/why:  Other MD x  When/who/why: 08/09/14 F/U with Hematologist  Activity   Getting around well at home. Sits when she gets tired  Fluid   Not quite 2 L   Diet   Low salt    Pulse: Regular BP: 88 Weight: 185.4  Last weight:  183.2 lb HR:  60 SPO2: 97  VAD interrogation revealed: Speed:  9200 Flow:  4.7 Power: 7.5 PI: 5.3 Alarms:  None Events:  ~5 PI/day with >20 events on 12/29, 10 events on 12/25 Fixed speed: 9200 Low speed limit: 8600 Primary Controller:  Replace back up battery in  __18___  Months. Back up controller:   Replace back up battery in  __18___  Months.  I reviewed the LVAD parameters from today, and compared the results to the patient's prior recorded data.  No programming changes were made.  The LVAD is functioning within specified parameters. The patient performs LVAD self-test daily.  LVAD interrogation was negative for any significant power changes or alarms, although she is having about 5 PI events daily with >20 08/30/14 and 12 events on 08/26/14. She is sensitive to these events and can feel her pump change speed for a moment. Discussed with [name] and myself regarding why these events can occur. Discussed that if she is feeling that they are occurring more often to try drinking a little more water than usual or to make sure she gets her 2 L of intake daily and to call the VAD Pager if any concern arises.   Drive line  exit site well healed and incorporated. The velour is fully implanted at exit site. Dressing dry and intact. No erythema or drainage. Stabilization device present and accurately applied. Driveline dressing is being changed weekly per sterile technique using Sorbaview dressing with biopatch on exit site. Pt denies fever or chills. Patient was given 8 weeks worth of dressing supplies.   LVAD equipment check completed and is in good working order. Back-up equipment present and back up controller charged during clinic visit. LVAD education done on emergency procedures and precautions.

## 2014-09-12 NOTE — Progress Notes (Signed)
Patient ID: Brianna Hanson, female   DOB: 02/04/1935, 80 y.o.   MRN: [id number] PCP: Dr. 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 [name] on 08/12/13 for routine follow up; no complications, plans to see her in 6 mos and repeat protein studies. Underwent implantation of the HeartMate II LVAD on 04/06/13 for DT. She is not on aspirin due to dizziness.   Follow up for Heart Failure: Doing well. Since the last visit she had follow up with hem-onc for monoclonal gammopathy and the plan to follow every 6 months. Denies SOB, orthopnea, PND, CP or dizziness.  Not walking as much due to cold weather. Weight at home 173-178 pounds. She has not had torsemide in the last 3-4 months. Abdominal pain resolved. No BRBPR. Dressing changes every week. Taking all medications.   ECHO (03/2014): EF 55% and RV slightly down,   Denies LVAD alarms.  Denies driveline trauma, erythema or drainage.  Denies ICD shocks.  Reports taking Coumadin as prescribed.  Denies bright red blood per rectum or melena, no dark urine or hematuria.     Past Medical History  Diagnosis Date  . Cardiac arrest - ventricular fibrillation 12/10    with successful resucitation, S/p ICD  . ICD (implantable cardiac defibrillator) in place     she has received appropriate therapy for VF  . Atrial fibrillation or flutter   . Osteopenia   . HTN (hypertension)     moderate  . Nonischemic cardiomyopathy     followed by Dr April Holding at [facility]  . CHF (congestive heart failure)   . Anemia   . S/P colonoscopy   . Plasma cell disorder 03/20/2012  . Plasma cell disorder 03/20/2012    Current Outpatient Prescriptions  Medication Sig Dispense Refill  . amiodarone (PACERONE) 200 MG tablet TAKE 1 TABLET BY MOUTH EVERY DAY 30 tablet 5  . amLODipine (NORVASC) 10 MG tablet TAKE 1 TABLET BY MOUTH TWICE A DAY 60  tablet 6  . carvedilol (COREG) 12.5 MG tablet Take 1.5 tabs twice daily 90 tablet 6  . Cholecalciferol (VITAMIN D3) 1000 UNITS CAPS Take 1 tablet by mouth daily.      [name] gabapentin (NEURONTIN) 100 MG capsule TAKE ONE CAPSULE BY MOUTH AT BEDTIME OR AS DIRECTED 30 capsule 6  . hydrALAZINE (APRESOLINE) 100 MG tablet TAKE 1 TABLET BY MOUTH 3 TIMES A DAY 90 tablet 6  . losartan (COZAAR) 50 MG tablet TAKE 1 TABLET BY MOUTH EVERY DAY 30 tablet 6  . pantoprazole (PROTONIX) 40 MG tablet TAKE 1 TABLET BY MOUTH EVERY DAY 30 tablet 6  . spironolactone (ALDACTONE) 25 MG tablet TAKE 1 TABLET BY MOUTH EVERY DAY 30 tablet 6  . torsemide (DEMADEX) 20 MG tablet PRN weight gain 3 lbs in 24 hours    . traMADol (ULTRAM) 50 MG tablet TAKE 1 OR 2 TABLETS BY MOUTH EVERY 6 HOURS AS NEEDED FOR PAIN 60 tablet 0  . warfarin (COUMADIN) 2.5 MG tablet Take one tab daily or as directed for INR 2.0 - 2.5 45 tablet 6   No current facility-administered medications for this encounter.    Review of patient's allergies indicates no known allergies.  REVIEW OF SYSTEMS: All systems negative except as listed in HPI, PMH and Problem list.  Filed Vitals:   09/12/14 1017  BP:  88/0  Pulse: 60  Weight: 185 lb 6.4 oz (84.097 kg)  SpO2: 97%    LVAD parameters: Flow:  4.4 Speed:  9200 PI:  7.1 Power:  5.1 Alarms:  None Events:  5 PI  events daily.   Fixed speed:  9200 Low speed limit:  8600  I  reviewed the LVAD parameters from today, and compared the results to the patient's prior recorded data.  No programming changes were made.  The LVAD is functioning within specified parameters.  The patient performs LVAD self-test daily.  LVAD interrogation was negative for any significant power changes, alarms or PI events/speed drops.  LVAD equipment check completed and is in good working order.  Back-up equipment present.   LVAD education done on emergency procedures and precautions and reviewed exit site care.   Physical Exam: MAP  88 GENERAL: Well appearing, female, NAD, [name] present HEENT: normal  NECK: Supple, JVP 5-6,  2+ bilaterally, no bruits.  No lymphadenopathy or thyromegaly appreciated.   CARDIAC:  LVAD hum present.  LUNGS:  Clear to auscultation bilaterally.  ABDOMEN:  Soft, round, nontender, positive bowel sounds x4.     LVAD exit site: Dressing dry and intact.  No erythema or drainage.  Stabilization device present and accurately applied. Driveline dressing changed weekly. EXTREMITIES:  Warm and dry, no cyanosis, clubbing, rash, edema  NEUROLOGIC:  Alert and oriented x 4.  Gait steady.  No aphasia.  No dysarthria.  Affect pleasant.      ASSESSMENT AND PLAN:  1) Chronic systolic HF: NICM, s/p ICD and LVAD for DT(04/2013) - Doing well.  NYHA II symptoms. Volume status stable.  Continue torsemide as needed. She has not needed torsemide in the last 3-4 months.  - Continue 25 mg spironolactone daily. -Continue carvedilol 18.75 mg twice a day -Continue losartan 50 mg daily  -Continue hydralazine 100 mg three times a day.   - MAP stable.  - Continue weekly dressing changes.  - Reinforced the need and importance of daily weights, a low sodium diet, and fluid restriction (less than 2 L a day). Instructed to call the HF clinic if weight increases more than 3 lbs overnight or 5 lbs in a week.  2) LVAD placed for DT 04/2013   - All parameters stable. - Check LDH, INR, CBC and BMET today.  - She is not on ASA d/t dizziness.   -Weekly dressing change by significant other.  VAD interrogated. No alarms  3) PAF - Stable. Continue Amio 200 mg daily.  Check TSH CMET next visit. Needs year eye exams.    4) HTN -Stable. Continue current regimen.   5) Anticoagulation - Check INR and if remains stable will continue q 3 week INRs. Plan to adjust INR accordingly.  As above no ASA d/t dizziness.  6) Abdominal Pain - resolved.   Follow up in 2 months  CLEGG,AMY, NP-C 10:41 AM   Patient seen and examined with Darrick Grinder, NP. We discussed all aspects of the encounter. I agree with the assessment and plan as stated above.   She is doing very well with mechanical support. Volume status looks great. Occasional PI events on VAD are stable. Driveline site looks good. Maintaining NSR on amio.   F/u 2 months.  Evangelynn Lochridge,MD 2:57 PM

## 2014-09-12 NOTE — Patient Instructions (Signed)
Follow up with [place] in 2 months  Pulmonary Function Tests will be scheduled with your next visit.   We will call you with your INR today and with any possible changes to your coumadin   Please follow up with your eye doctor for annual eye exams

## 2014-10-03 ENCOUNTER — Ambulatory Visit (HOSPITAL_COMMUNITY): Payer: Self-pay | Admitting: Infectious Diseases

## 2014-10-03 ENCOUNTER — Other Ambulatory Visit (HOSPITAL_COMMUNITY): Payer: Self-pay | Admitting: Infectious Diseases

## 2014-10-03 ENCOUNTER — Other Ambulatory Visit (INDEPENDENT_AMBULATORY_CARE_PROVIDER_SITE_OTHER): Payer: Medicare Other | Admitting: *Deleted

## 2014-10-03 DIAGNOSIS — Z95811 Presence of heart assist device: Secondary | ICD-10-CM

## 2014-10-03 DIAGNOSIS — Z7901 Long term (current) use of anticoagulants: Secondary | ICD-10-CM | POA: Diagnosis not present

## 2014-10-03 LAB — PROTIME-INR
INR: 2.8 ratio — ABNORMAL HIGH (ref 0.8–1.0)
Prothrombin Time: 30.6 s — ABNORMAL HIGH (ref 9.6–13.1)

## 2014-10-03 NOTE — Addendum Note (Signed)
Addended by: Eulis Foster on: 10/03/2014 09:23 AM   Modules accepted: Orders

## 2014-10-07 ENCOUNTER — Other Ambulatory Visit: Payer: Self-pay | Admitting: Internal Medicine

## 2014-10-17 ENCOUNTER — Other Ambulatory Visit (HOSPITAL_COMMUNITY): Payer: Self-pay | Admitting: Cardiology

## 2014-10-17 DIAGNOSIS — I5022 Chronic systolic (congestive) heart failure: Secondary | ICD-10-CM

## 2014-10-17 DIAGNOSIS — Z7901 Long term (current) use of anticoagulants: Secondary | ICD-10-CM

## 2014-10-17 MED ORDER — HYDRALAZINE HCL 100 MG PO TABS: ORAL_TABLET | ORAL | Status: DC

## 2014-10-17 MED ORDER — CARVEDILOL 12.5 MG PO TABS: ORAL_TABLET | ORAL | Status: DC

## 2014-10-24 ENCOUNTER — Ambulatory Visit (HOSPITAL_COMMUNITY): Payer: Self-pay | Admitting: Infectious Diseases

## 2014-10-24 ENCOUNTER — Other Ambulatory Visit (INDEPENDENT_AMBULATORY_CARE_PROVIDER_SITE_OTHER): Payer: Medicare Other | Admitting: *Deleted

## 2014-10-24 DIAGNOSIS — Z7901 Long term (current) use of anticoagulants: Secondary | ICD-10-CM | POA: Diagnosis not present

## 2014-10-24 DIAGNOSIS — Z95811 Presence of heart assist device: Secondary | ICD-10-CM

## 2014-10-24 LAB — PROTIME-INR
INR: 2.6 ratio — ABNORMAL HIGH (ref 0.8–1.0)
Prothrombin Time: 27.8 s — ABNORMAL HIGH (ref 9.6–13.1)

## 2014-10-26 DIAGNOSIS — H2513 Age-related nuclear cataract, bilateral: Secondary | ICD-10-CM | POA: Diagnosis not present

## 2014-10-26 DIAGNOSIS — H52202 Unspecified astigmatism, left eye: Secondary | ICD-10-CM | POA: Diagnosis not present

## 2014-10-26 DIAGNOSIS — H524 Presbyopia: Secondary | ICD-10-CM | POA: Diagnosis not present

## 2014-10-26 DIAGNOSIS — H52201 Unspecified astigmatism, right eye: Secondary | ICD-10-CM | POA: Diagnosis not present

## 2014-10-31 ENCOUNTER — Telehealth: Payer: Self-pay | Admitting: Cardiology

## 2014-10-31 ENCOUNTER — Ambulatory Visit (INDEPENDENT_AMBULATORY_CARE_PROVIDER_SITE_OTHER): Payer: Medicare Other | Admitting: *Deleted

## 2014-10-31 ENCOUNTER — Other Ambulatory Visit: Payer: Self-pay | Admitting: Internal Medicine

## 2014-10-31 DIAGNOSIS — I5022 Chronic systolic (congestive) heart failure: Secondary | ICD-10-CM

## 2014-10-31 DIAGNOSIS — I429 Cardiomyopathy, unspecified: Secondary | ICD-10-CM | POA: Diagnosis not present

## 2014-10-31 NOTE — Telephone Encounter (Signed)
Spoke with pt and reminded pt of remote transmission that is due today. Pt verbalized understanding.   

## 2014-10-31 NOTE — Progress Notes (Signed)
Remote ICD transmission.   

## 2014-11-03 LAB — MDC_IDC_ENUM_SESS_TYPE_REMOTE
Brady Statistic RA Percent Paced: 31 %
Brady Statistic RV Percent Paced: 1 % — CL
HighPow Impedance: 40 Ohm
Implantable Pulse Generator Serial Number: 754815
Lead Channel Impedance Value: 290 Ohm
Lead Channel Impedance Value: 300 Ohm
Lead Channel Sensing Intrinsic Amplitude: 0.8 mV
Lead Channel Sensing Intrinsic Amplitude: 9.7 mV
Lead Channel Setting Pacing Amplitude: 2 V
Lead Channel Setting Pacing Amplitude: 2.5 V
Lead Channel Setting Pacing Pulse Width: 0.5 ms
Lead Channel Setting Sensing Sensitivity: 0.5 mV
Zone Setting Detection Interval: 300 ms
Zone Setting Detection Interval: 350 ms

## 2014-11-08 ENCOUNTER — Other Ambulatory Visit (HOSPITAL_COMMUNITY): Payer: Self-pay | Admitting: Infectious Diseases

## 2014-11-08 DIAGNOSIS — Z79899 Other long term (current) drug therapy: Secondary | ICD-10-CM

## 2014-11-08 DIAGNOSIS — Z7901 Long term (current) use of anticoagulants: Secondary | ICD-10-CM

## 2014-11-08 DIAGNOSIS — Z95811 Presence of heart assist device: Secondary | ICD-10-CM

## 2014-11-09 ENCOUNTER — Ambulatory Visit (HOSPITAL_COMMUNITY): Payer: Self-pay | Admitting: *Deleted

## 2014-11-09 ENCOUNTER — Ambulatory Visit (HOSPITAL_COMMUNITY)
Admission: RE | Admit: 2014-11-09 | Discharge: 2014-11-09 | Disposition: A | Discharge status: Home / Self Care | Payer: Medicare Other | Source: Ambulatory Visit | Attending: Internal Medicine | Admitting: Internal Medicine

## 2014-11-09 ENCOUNTER — Other Ambulatory Visit (HOSPITAL_COMMUNITY): Payer: Self-pay | Admitting: *Deleted

## 2014-11-09 VITALS — BP 98/0 | HR 70 | Ht 60.0 in | Wt 182.6 lb

## 2014-11-09 DIAGNOSIS — Z95811 Presence of heart assist device: Secondary | ICD-10-CM

## 2014-11-09 DIAGNOSIS — I509 Heart failure, unspecified: Secondary | ICD-10-CM | POA: Diagnosis not present

## 2014-11-09 DIAGNOSIS — I480 Paroxysmal atrial fibrillation: Secondary | ICD-10-CM

## 2014-11-09 DIAGNOSIS — I5022 Chronic systolic (congestive) heart failure: Secondary | ICD-10-CM

## 2014-11-09 DIAGNOSIS — Z79899 Other long term (current) drug therapy: Secondary | ICD-10-CM

## 2014-11-09 DIAGNOSIS — Z4509 Encounter for adjustment and management of other cardiac device: Secondary | ICD-10-CM | POA: Insufficient documentation

## 2014-11-09 DIAGNOSIS — Z7901 Long term (current) use of anticoagulants: Secondary | ICD-10-CM

## 2014-11-09 DIAGNOSIS — Z950 Presence of cardiac pacemaker: Secondary | ICD-10-CM | POA: Diagnosis not present

## 2014-11-09 DIAGNOSIS — Z4682 Encounter for fitting and adjustment of non-vascular catheter: Secondary | ICD-10-CM | POA: Diagnosis not present

## 2014-11-09 DIAGNOSIS — I50810 Right heart failure, unspecified: Secondary | ICD-10-CM

## 2014-11-09 DIAGNOSIS — I517 Cardiomegaly: Secondary | ICD-10-CM | POA: Diagnosis not present

## 2014-11-09 LAB — CBC
HCT: 33.4 % — ABNORMAL LOW (ref 36.0–46.0)
Hemoglobin: 10.8 g/dL — ABNORMAL LOW (ref 12.0–15.0)
MCH: 28.2 pg (ref 26.0–34.0)
MCHC: 32.3 g/dL (ref 30.0–36.0)
MCV: 87.2 fL (ref 78.0–100.0)
Platelets: 240 10*3/uL (ref 150–400)
RBC: 3.83 MIL/uL — ABNORMAL LOW (ref 3.87–5.11)
RDW: 17.3 % — ABNORMAL HIGH (ref 11.5–15.5)
WBC: 5.4 10*3/uL (ref 4.0–10.5)

## 2014-11-09 LAB — COMPREHENSIVE METABOLIC PANEL
ALT: 25 U/L (ref 0–35)
AST: 28 U/L (ref 0–37)
Albumin: 3.2 g/dL — ABNORMAL LOW (ref 3.5–5.2)
Alkaline Phosphatase: 94 U/L (ref 39–117)
Anion gap: 6 (ref 5–15)
BUN: 16 mg/dL (ref 6–23)
CO2: 24 mmol/L (ref 19–32)
Calcium: 9 mg/dL (ref 8.4–10.5)
Chloride: 109 mmol/L (ref 96–112)
Creatinine, Ser: 1.33 mg/dL — ABNORMAL HIGH (ref 0.50–1.10)
GFR calc Af Amer: 43 mL/min — ABNORMAL LOW (ref >90)
GFR calc non Af Amer: 37 mL/min — ABNORMAL LOW (ref >90)
Glucose, Bld: 89 mg/dL (ref 70–99)
Potassium: 3.7 mmol/L (ref 3.5–5.1)
Sodium: 139 mmol/L (ref 135–145)
Total Bilirubin: 0.4 mg/dL (ref 0.3–1.2)
Total Protein: 8.1 g/dL (ref 6.0–8.3)

## 2014-11-09 LAB — LACTATE DEHYDROGENASE: LDH: 212 U/L (ref 94–250)

## 2014-11-09 LAB — PROTIME-INR
INR: 2.08 — ABNORMAL HIGH (ref 0.00–1.49)
Prothrombin Time: 23.6 seconds — ABNORMAL HIGH (ref 11.6–15.2)

## 2014-11-09 LAB — TSH: TSH: 3.214 u[IU]/mL (ref 0.350–4.500)

## 2014-11-09 MED ORDER — AMIODARONE HCL 200 MG PO TABS: 100.0000 mg | ORAL_TABLET | Freq: Every day | ORAL | Status: DC

## 2014-11-09 NOTE — Progress Notes (Addendum)
Symptom  Yes  No  Details   Angina        x Activity:   Claudication        x How far:   Syncope        x When: dizzy yesterday with orthostatic changes  Stroke        x   Orthopnea        x How many pillows:  2  PND        x How often:  CPAP      N/A How many hrs:   Pedal edema        x       PRN torsemide x 1 over last 5 months  Abd fullness        x   N&V        x   Diaphoresis        x          When:  Occasional night sweat   Bleeding       x   Urine color   Light yellow  SOB        x Activity:   Palpitations        x When:  ICD shock        x   Hospitlizaitons        x When/where/why:  ED visit        x When/where/why:  Other MD              x When/who/why:   Activity    Walk 1 mile 2x week  Fluid    No limitaions  Diet    No added salt   MAP:  98 HR: 70 SPO2: 94 Weight:  182.6  lbs Last weight:  185.4  lbs   LVAD interrogation reveals:  Speed:  9200 Flow:  3.9 Power:  4.9 PI:  6.5 Alarms:  none Events:   0 - 5  PI events  Fixed speed:  9200 Low speed limit:  8600 Primary Controller:  Replace back up battery in 26 months (Jan/2017) Back up controller:   Replace back up battery in 26 months (Jan/2017)   I reviewed the LVAD parameters from today, and compared the results to the patient's prior recorded data. No programming changes were made. The LVAD is functioning within specified parameters. LVAD interrogation was negative for any significant power changes, alarms or PI events/speed drops.  LVAD equipment check completed and is in good working order. Back-up equipment present. LVAD education done on emergency procedures and precautions and reviewed exit site care.    LVAD exit site: Well healed and incorporated. The velour is fully implanted at exit site. Dressing dry and intact. No erythema or drainage. Stabilization device present and accurately applied. Driveline dressing is being changed weekly per sterile technique using Sorbaview dressing with biopatch on  exit site. Pt denies fever or chills. Pt/caregiver provided adequate dressing supplies for home.  Pt completed 1.5 yr Intermacs f/u today including EQ-5D-3L QOL, KCCQ-12, and neurocognitive trailmaking. Pt completed 550 feet during 6 min walk with multiple rest stops for fatigue and SOB. This is a decline from 700 feet completed both at 6 mos and 1 yr f/u visits.    Amiodarone decreased to 100 mg daily per [name]. Amiodarone screening completed today including: CXR, CMP, TSH, and T4. Pt reports she recently had eye exam completed with small cataract noted on left eye.

## 2014-11-09 NOTE — Progress Notes (Signed)
Patient ID: MAEDELL HEDGER, female   DOB: [birth year], 80 y.o.   MRN: [id number] PCP: Dr. Scarlette Calico Hematology: Dr [name]  HPI: Ms Hegler is a 80 year old female with a PMH of HF due to severe NICM, chronic systolic HF,  PAF,  plasma cell disorder (Likely IgA MGUS) - saw Dr. [name] on 08/12/13 for routine follow up; no complications, plans to see her in 6 mos and repeat protein studies. Underwent implantation of the HeartMate II LVAD on 04/06/13 for DT. She is not on aspirin due to dizziness.   Follow up for Heart Failure: Doing well. Feels good. Stays active. Denies SOB, orthopnea, PND, CP. Occasional dizzy spell. No further ab pain. Weight at home stable. Uses torsemide rarely as needed. No BRBPR. Dressing changes every week. Taking all medications.   ECHO (03/2014): EF 55% and RV slightly down,   Denies LVAD alarms.  Denies driveline trauma, erythema or drainage.  Denies ICD shocks.  Reports taking Coumadin as prescribed.  Denies bright red blood per rectum or melena, no dark urine or hematuria.     Past Medical History  Diagnosis Date  . Cardiac arrest - ventricular fibrillation 12/10    with successful resucitation, S/p ICD  . ICD (implantable cardiac defibrillator) in place     she has received appropriate therapy for VF  . Atrial fibrillation or flutter   . Osteopenia   . HTN (hypertension)     moderate  . Nonischemic cardiomyopathy     follow[facility] by Dr April Holding at [facility]  . CHF (congestive heart failure)   . Anemia   . S/P colonoscopy   . Plasma cell disorder 03/20/2012  . Plasma cell disorder 03/20/2012    Current Outpatient Prescriptions  Medication Sig Dispense Refill  . amiodarone (PACERONE) 200 MG tablet Take 0.5 tablets (100 mg total) by mouth daily. 30 tablet 5  . amLODipine (NORVASC) 10 MG tablet TAKE 1 TABLET BY MOUTH TWICE A DAY 60 tablet 6  . carvedilol (COREG) 12.5 MG tablet Take 1.5 tabs twice daily 90 tablet 6  . Cholecalciferol (VITAMIN D3) 1000 UNITS CAPS Take 1  tablet by mouth daily.      [name] gabapentin (NEURONTIN) 100 MG capsule TAKE ONE CAPSULE BY MOUTH AT BEDTIME OR AS DIRECTED 30 capsule 6  . hydrALAZINE (APRESOLINE) 100 MG tablet TAKE 1 TABLET BY MOUTH 3 TIMES A DAY 90 tablet 6  . losartan (COZAAR) 50 MG tablet TAKE 1 TABLET BY MOUTH EVERY DAY 30 tablet 6  . pantoprazole (PROTONIX) 40 MG tablet TAKE 1 TABLET BY MOUTH EVERY DAY 30 tablet 6  . spironolactone (ALDACTONE) 25 MG tablet TAKE 1 TABLET BY MOUTH EVERY DAY 30 tablet 6  . traMADol (ULTRAM) 50 MG tablet TAKE 1 OR 2 TABLETS BY MOUTH EVERY 6 HOURS AS NEEDED FOR PAIN 60 tablet 0  . warfarin (COUMADIN) 2.5 MG tablet Take one tab daily or as directed for INR 2.0 - 2.5 45 tablet 6  . torsemide (DEMADEX) 20 MG tablet PRN weight gain 3 lbs in 24 hours     No current facility-administered medications for this encounter.    Review of patient's allergies indicates no known allergies.  REVIEW OF SYSTEMS: All systems negative except as listed in HPI, PMH and Problem list.  Filed Vitals:   11/09/14 1010  BP: 98/0  Pulse: 70  Height: 5' (1.524 m)  Weight: 182 lb 9.6 oz (82.827 kg)  SpO2: 94%    LVAD parameters: Speed: 9200 Flow:  3.9 Power: 4.9 PI: 6.5 Alarms: none Events: 0 - 5 PI events  Fixed speed: 9200 Low speed limit: 8600 Primary Controller: Replace back up battery in 26 months (Jan/2017) Back up controller: Replace back up battery in 26 months (Jan/2017)  I  reviewed the LVAD parameters from today, and compared the results to the patient's prior recorded data.  No programming changes were made.  The LVAD is functioning within specified parameters.  The patient performs LVAD self-test daily.  LVAD interrogation was negative for any significant power changes, alarms or PI events/speed drops.  LVAD equipment check completed and is in good working order.  Back-up equipment present.   LVAD education done on emergency procedures and precautions and reviewed exit site care.    Physical Exam: MAP 88 GENERAL: Well appearing, female, NAD, [name] present HEENT: normal  NECK: Supple, JVP 5,  2+ bilaterally, no bruits.  No lymphadenopathy or thyromegaly appreciated.   CARDIAC:  LVAD hum present.  LUNGS:  Clear to auscultation bilaterally.  ABDOMEN:  Soft, nontender, positive bowel sounds x4.     LVAD exit site: Dressing dry and intact.  No erythema or drainage.  Stabilization device present and accurately applied. Driveline dressing changed weekly. EXTREMITIES:  Warm and dry, no cyanosis, clubbing, rash, edema  NEUROLOGIC:  Alert and oriented x 4.  Gait steady.  No aphasia.  No dysarthria.  Affect pleasant.      ASSESSMENT AND PLAN:  1) Chronic systolic HF: NICM, s/p ICD and LVAD for DT(04/2013) - Doing well.  NYHA II symptoms. Volume status stable.  Continue torsemide as needed.  -Continue 25 mg spironolactone daily. -Continue carvedilol 18.75 mg twice a day -Continue losartan 50 mg daily  -Continue hydralazine 100 mg three times a day.   - Continue weekly dressing changes.  - Echo at next visit - Reinforced the need and importance of daily weights, a low sodium diet, and fluid restriction (less than 2 L a day). Instructed to call the HF clinic if weight increases more than 3 lbs overnight or 5 lbs in a week.  2) LVAD placed for DT 04/2013   - All parameters stable. - Check LDH, INR, CBC and BMET today.  - She is not on ASA d/t dizziness.   -Weekly dressing change by significant other.  VAD interrogated. No alarms  3) PAF - Stable. Will drop amio to 100 mg daily.  Check TSH CMET, CXR. Needs year eye exams.    4) HTN -MAP slightly up today but has been well controlled. Continue current regimen.   5) Anticoagulation - Check INR and if remains stable will continue q 3 week INRs. Plan to adjust INR accordingly.  As above no ASA d/t dizziness. Benay Spice 3:42 PM

## 2014-11-09 NOTE — Patient Instructions (Signed)
1.  Decrease Pacerone (amiodarone) to 100 mg daily (1/2 tab daily) 2.  Will call you with INR results and coumadin dosing. 3.  Return to [place] [facility] 2 months.

## 2014-11-10 ENCOUNTER — Encounter: Payer: Self-pay | Admitting: *Deleted

## 2014-11-10 LAB — PREALBUMIN: Prealbumin: 22.7 mg/dL (ref 17.0–34.0)

## 2014-11-10 LAB — T4, FREE: Free T4: 1.38 ng/dL (ref 0.80–1.80)

## 2014-11-14 ENCOUNTER — Other Ambulatory Visit: Payer: Medicare Other

## 2014-11-16 ENCOUNTER — Encounter: Payer: Self-pay | Admitting: Internal Medicine

## 2014-11-30 ENCOUNTER — Ambulatory Visit (HOSPITAL_COMMUNITY): Payer: Self-pay | Admitting: *Deleted

## 2014-11-30 ENCOUNTER — Other Ambulatory Visit (INDEPENDENT_AMBULATORY_CARE_PROVIDER_SITE_OTHER): Payer: Medicare Other | Admitting: *Deleted

## 2014-11-30 DIAGNOSIS — Z95811 Presence of heart assist device: Secondary | ICD-10-CM

## 2014-11-30 DIAGNOSIS — Z7901 Long term (current) use of anticoagulants: Secondary | ICD-10-CM | POA: Diagnosis not present

## 2014-11-30 LAB — PROTIME-INR
INR: 2.2 ratio — ABNORMAL HIGH (ref 0.8–1.0)
Prothrombin Time: 23.5 s — ABNORMAL HIGH (ref 9.6–13.1)

## 2014-11-30 NOTE — Addendum Note (Signed)
Addended by: Eulis Foster on: 11/30/2014 08:57 AM   Modules accepted: Orders

## 2014-12-14 ENCOUNTER — Ambulatory Visit (HOSPITAL_COMMUNITY): Payer: Self-pay | Admitting: *Deleted

## 2014-12-14 ENCOUNTER — Other Ambulatory Visit (INDEPENDENT_AMBULATORY_CARE_PROVIDER_SITE_OTHER): Payer: Medicare Other | Admitting: *Deleted

## 2014-12-14 DIAGNOSIS — Z95811 Presence of heart assist device: Secondary | ICD-10-CM

## 2014-12-14 DIAGNOSIS — Z7901 Long term (current) use of anticoagulants: Secondary | ICD-10-CM

## 2014-12-14 LAB — PROTIME-INR
INR: 2.2 ratio — ABNORMAL HIGH (ref 0.8–1.0)
Prothrombin Time: 24.4 s — ABNORMAL HIGH (ref 9.6–13.1)

## 2014-12-26 ENCOUNTER — Ambulatory Visit (HOSPITAL_COMMUNITY): Payer: Self-pay | Admitting: *Deleted

## 2014-12-26 ENCOUNTER — Other Ambulatory Visit (INDEPENDENT_AMBULATORY_CARE_PROVIDER_SITE_OTHER): Payer: Medicare Other | Admitting: *Deleted

## 2014-12-26 DIAGNOSIS — Z95811 Presence of heart assist device: Secondary | ICD-10-CM | POA: Diagnosis not present

## 2014-12-26 DIAGNOSIS — Z7901 Long term (current) use of anticoagulants: Secondary | ICD-10-CM | POA: Diagnosis not present

## 2014-12-26 LAB — PROTIME-INR
INR: 2.1 ratio — ABNORMAL HIGH (ref 0.8–1.0)
Prothrombin Time: 22.7 s — ABNORMAL HIGH (ref 9.6–13.1)

## 2014-12-29 IMAGING — US IR FLUORO GUIDE CV LINE*R*
1 series · 2 of 2 positions shown · non-contrast
Comparison: none

PICC PLACEMENT WITH ULTRASOUND AND FLUOROSCOPIC  GUIDANCE
CLINICAL HISTORY: 78-year-old female in need of durable central
venous access for IV melanoma

[Series 1: ir fluoro guide cv line*right* · 2 of 2 slices shown]
[im 1/2]
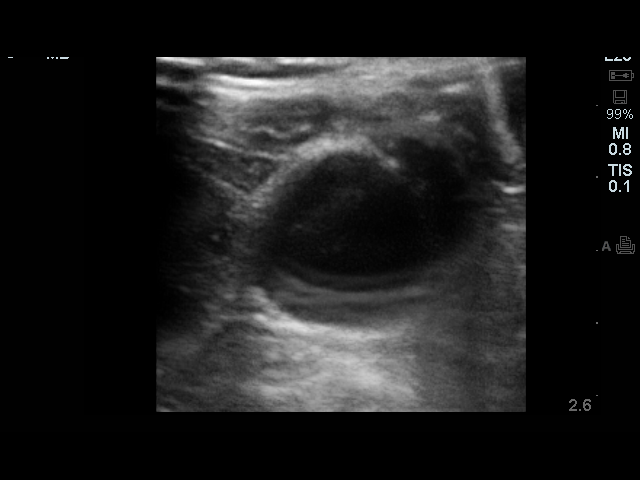
[im 2/2]
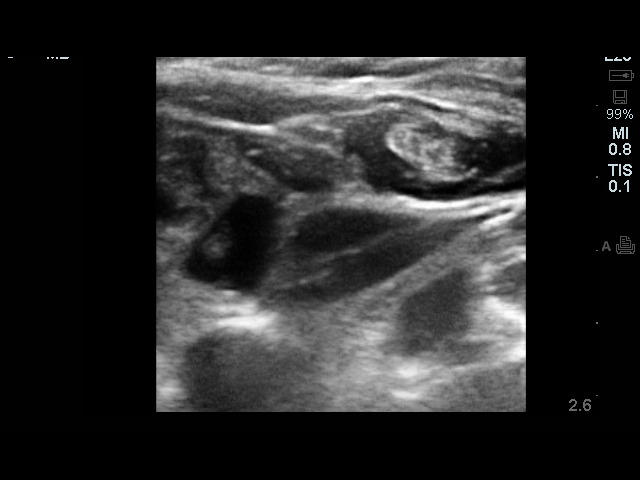

[2 of 2 positions shown; findings below may reference images not displayed]

Fluoroscopy Time: 30 seconds .

Sedation:  Moderate (conscious) sedation was provided during this
procedure.  A total of 50 mcg Fentanyl and 0.5 mg Versed were
administered intravenously.  The patient and the patient vital
signs were monitored continuously by radiology nursing throughout
the course of the sedated portion of the procedure.

Sedation time:  11 minutes

Antibiotic Prophylaxis:  Ancef administered intravenously within 1
hour of prior to skin incision.

Procedure:

The right neck and chest was prepped with chlorhexidine, draped in
the usual sterile fashion using maximum barrier technique (cap and
mask, sterile gown, sterile gloves, large sterile sheet, hand
hygiene and cutaneous antiseptic).  Local anesthesia was attained
by infiltration with 1% lidocaine.

Ultrasound demonstrated patency of the right internal jugular vein,
and this was documented with an image.  Under real-time ultrasound
guidance, this vein was accessed with a 21 gauge micropuncture
needle and image documentation was performed.  The needle was
exchanged over a guidewire for a peel-away sheath was advanced into
the right atrium.  A suitable skin exit site inferior to the
clavicle was selected.  Local anesthesia was obtained by
infiltration of 1% lidocaine.  Small dermatotomy was made with a
#11 blade.  A tunneled dual lumen power PICC was then tunneled from
the skin exit site to the dermatotomy overlying the venous access
site.  The 23 cm 5 French dual lumen power injectable PICC was
advanced through the peel-away sheath, and positioned with its tip
at the lower SVC/right atrial junction.  Fluoroscopy during the
procedure and fluoro spot radiograph confirms appropriate catheter
position.  The catheter was flushed, secured to the skin with
Prolene sutures, and covered with a sterile dressing.

Complications:  None.  The patient tolerated the procedure well.
IMPRESSION: Successful placement of a right IJ approach tunneled dual lumen
power PICC with sonographic and fluoroscopic guidance.  The
catheter is ready for use.

[REDACTED]

## 2015-01-01 ENCOUNTER — Other Ambulatory Visit (HOSPITAL_COMMUNITY): Payer: Self-pay | Admitting: Internal Medicine

## 2015-01-10 ENCOUNTER — Ambulatory Visit (HOSPITAL_COMMUNITY)
Admission: RE | Admit: 2015-01-10 | Discharge: 2015-01-10 | Disposition: A | Discharge status: Home / Self Care | Payer: Medicare Other | Source: Ambulatory Visit | Attending: Cardiology | Admitting: Cardiology

## 2015-01-10 ENCOUNTER — Ambulatory Visit (HOSPITAL_COMMUNITY): Payer: Self-pay | Admitting: *Deleted

## 2015-01-10 ENCOUNTER — Encounter (HOSPITAL_COMMUNITY): Payer: Medicare Other

## 2015-01-10 ENCOUNTER — Other Ambulatory Visit (HOSPITAL_COMMUNITY): Payer: Self-pay | Admitting: *Deleted

## 2015-01-10 VITALS — BP 99/66 | HR 62 | Ht 60.0 in | Wt 182.6 lb

## 2015-01-10 DIAGNOSIS — Z7901 Long term (current) use of anticoagulants: Secondary | ICD-10-CM

## 2015-01-10 DIAGNOSIS — I480 Paroxysmal atrial fibrillation: Secondary | ICD-10-CM | POA: Insufficient documentation

## 2015-01-10 DIAGNOSIS — I10 Essential (primary) hypertension: Secondary | ICD-10-CM | POA: Diagnosis not present

## 2015-01-10 DIAGNOSIS — Z79899 Other long term (current) drug therapy: Secondary | ICD-10-CM | POA: Insufficient documentation

## 2015-01-10 DIAGNOSIS — Z95810 Presence of automatic (implantable) cardiac defibrillator: Secondary | ICD-10-CM | POA: Diagnosis not present

## 2015-01-10 DIAGNOSIS — I5022 Chronic systolic (congestive) heart failure: Secondary | ICD-10-CM | POA: Diagnosis not present

## 2015-01-10 DIAGNOSIS — Z95811 Presence of heart assist device: Secondary | ICD-10-CM | POA: Diagnosis not present

## 2015-01-10 DIAGNOSIS — I50810 Right heart failure, unspecified: Secondary | ICD-10-CM

## 2015-01-10 DIAGNOSIS — R0600 Dyspnea, unspecified: Secondary | ICD-10-CM

## 2015-01-10 LAB — BRAIN NATRIURETIC PEPTIDE: B Natriuretic Peptide: 162.1 pg/mL — ABNORMAL HIGH (ref 0.0–100.0)

## 2015-01-10 LAB — BASIC METABOLIC PANEL
Anion gap: 7 (ref 5–15)
BUN: 14 mg/dL (ref 6–20)
CO2: 22 mmol/L (ref 22–32)
Calcium: 9 mg/dL (ref 8.9–10.3)
Chloride: 111 mmol/L (ref 101–111)
Creatinine, Ser: 1.48 mg/dL — ABNORMAL HIGH (ref 0.44–1.00)
GFR calc Af Amer: 38 mL/min — ABNORMAL LOW (ref >60)
GFR calc non Af Amer: 33 mL/min — ABNORMAL LOW (ref >60)
Glucose, Bld: 83 mg/dL (ref 70–99)
Potassium: 3.8 mmol/L (ref 3.5–5.1)
Sodium: 140 mmol/L (ref 135–145)

## 2015-01-10 LAB — LACTATE DEHYDROGENASE: LDH: 218 U/L — ABNORMAL HIGH (ref 98–192)

## 2015-01-10 LAB — CBC
HCT: 32.2 % — ABNORMAL LOW (ref 36.0–46.0)
Hemoglobin: 10.3 g/dL — ABNORMAL LOW (ref 12.0–15.0)
MCH: 27.8 pg (ref 26.0–34.0)
MCHC: 32 g/dL (ref 30.0–36.0)
MCV: 87 fL (ref 78.0–100.0)
Platelets: 218 10*3/uL (ref 150–400)
RBC: 3.7 MIL/uL — ABNORMAL LOW (ref 3.87–5.11)
RDW: 18.4 % — ABNORMAL HIGH (ref 11.5–15.5)
WBC: 6.7 10*3/uL (ref 4.0–10.5)

## 2015-01-10 LAB — PROTIME-INR
INR: 2.33 — ABNORMAL HIGH (ref 0.00–1.49)
Prothrombin Time: 25.8 seconds — ABNORMAL HIGH (ref 11.6–15.2)

## 2015-01-10 MED ORDER — AMIODARONE HCL 200 MG PO TABS: 100.0000 mg | ORAL_TABLET | Freq: Every day | ORAL | Status: DC

## 2015-01-10 NOTE — Progress Notes (Addendum)
Symptom  Yes  No  Details   Angina        x Activity:   Claudication        x How far:   Syncope        x When:   Stroke        x   Orthopnea        x How many pillows:  2  PND        x How often:  CPAP      N/A How many hrs:   Pedal edema        x       occasionally; clears overnight  Abd fullness        x   N&V        x  Poor appetite x 1.[redacted] weeks along with fatigue   Diaphoresis        x          When:  Occasional night sweat   Bleeding       x   Urine color   Light yellow  SOB        x Activity:   Palpitations        x When:  ICD shock        x   Hospitlizaitons        x When/where/why:  ED visit        x When/where/why:  Other MD              x   When/who/why:  Activity    Walk 1 mile 2x week  Fluid    No limitaions  Diet    No added salt   MAP:  98 HR: 70 SPO2: 94 Weight:  182.6  lbs Last weight:  182.6  lbs   LVAD interrogation reveals:  Speed:  9200 Flow:  4.6 Power:  5.2 PI:  6.9 Alarms:  none Events:   0 - 5  PI events  Fixed speed:  9200 Low speed limit:  8600 Primary Controller:  Replace back up battery in 14 months (Jan/2017) Back up controller:   Replace back up battery in 14 months (Jan/2017)  I reviewed the LVAD parameters from today, and compared the results to the patient's prior recorded data. No programming changes were made. The LVAD is functioning within specified parameters. LVAD interrogation was negative for any significant power changes, alarms or PI events/speed drops.  LVAD equipment check completed and is in good working order. Back-up equipment present. LVAD education done on emergency procedures and precautions and reviewed exit site care.   LVAD exit site: Well healed and incorporated. The velour is fully implanted at exit site. Dressing dry and intact. No erythema or drainage. Stabilization device present and accurately applied. Driveline dressing is being changed weekly per sterile technique using Sorbaview dressing with biopatch on exit  site. Pt denies fever or chills. Pt/caregiver provided adequate dressing supplies for home.  HPI: Ms Digangi is a 80 year old female with a PMH of HF due to severe NICM, chronic systolic HF,  PAF,  plasma cell disorder (Likely IgA MGUS) - saw Dr. [name] on 08/12/13 for routine follow up; no complications, plans to see her in 6 mos and repeat protein studies. Underwent implantation of the HeartMate II LVAD on 04/06/13 for DT. She is not on aspirin due to dizziness.   She sees hem-onc for monoclonal gammopathy with followup every 6 months.   Denies SOB, orthopnea, PND,  CP or dizziness.  Not needing torsemide.  No BRBPR/melena. Taking all medications. She had an episode while on vacation last week of profound fatigue and loss of appetite.  This lasted several days. It has completely resolved.  No fever, chills, diarrhea/vomiting.  St Jude device was interrogated today, she has had no atrial fibrillation. Hemoglobin stable today at 10.3.   Echo (7/15) with LV EF 30%.   Denies LVAD alarms.  Denies driveline trauma, erythema or drainage.  Denies ICD shocks.  Reports taking Coumadin as prescribed.  Denies bright red blood per rectum or melena, no dark urine or hematuria.    Past Medical History  Diagnosis Date  . Cardiac arrest - ventricular fibrillation 12/10    with successful resucitation, S/p ICD  . ICD (implantable cardiac defibrillator) in place     she has received appropriate therapy for VF  . Atrial fibrillation or flutter   . Osteopenia   . HTN (hypertension)     moderate  . Nonischemic cardiomyopathy     followed by Dr April Holding at [facility]  . CHF (congestive heart failure)   . Anemia   . S/P colonoscopy   . Plasma cell disorder 03/20/2012  . Plasma cell disorder 03/20/2012    Current Outpatient Prescriptions  Medication Sig Dispense Refill  . amLODipine (NORVASC) 10 MG tablet TAKE 1 TABLET BY MOUTH TWICE A DAY 60 tablet 6  . carvedilol (COREG) 12.5 MG tablet Take 1.5 tabs twice daily  90 tablet 6  . Cholecalciferol (VITAMIN D3) 1000 UNITS CAPS Take 1 tablet by mouth daily.      [name] gabapentin (NEURONTIN) 100 MG capsule TAKE ONE CAPSULE BY MOUTH AT BEDTIME OR AS DIRECTED 30 capsule 6  . hydrALAZINE (APRESOLINE) 100 MG tablet TAKE 1 TABLET BY MOUTH 3 TIMES A DAY 90 tablet 6  . losartan (COZAAR) 50 MG tablet TAKE 1 TABLET BY MOUTH EVERY DAY 30 tablet 6  . pantoprazole (PROTONIX) 40 MG tablet TAKE 1 TABLET BY MOUTH EVERY DAY 30 tablet 6  . spironolactone (ALDACTONE) 25 MG tablet TAKE 1 TABLET BY MOUTH EVERY DAY 30 tablet 6  . traMADol (ULTRAM) 50 MG tablet TAKE 1 OR 2 TABLETS BY MOUTH EVERY 6 HOURS AS NEEDED FOR PAIN 60 tablet 0  . warfarin (COUMADIN) 2.5 MG tablet Take one tab daily or as directed for INR 2.0 - 2.5 45 tablet 6  . amiodarone (PACERONE) 200 MG tablet Take 0.5 tablets (100 mg total) by mouth daily. 30 tablet 5  . torsemide (DEMADEX) 20 MG tablet PRN weight gain 3 lbs in 24 hours     No current facility-administered medications for this encounter.    Review of patient's allergies indicates no known allergies.  REVIEW OF SYSTEMS: All systems negative except as listed in HPI, PMH and Problem list.  Filed Vitals:   01/10/15 1040  BP: 99/66  Pulse: 62  Height: 5' (1.524 m)  Weight: 182 lb 9.6 oz (82.827 kg)  SpO2: 95%    LVAD parameters: See above.  Physical Exam: MAP 81 GENERAL: Well appearing, female, NAD, [name] present HEENT: normal  NECK: Supple, JVP 5-6,  2+ bilaterally, no bruits.  No lymphadenopathy or thyromegaly appreciated.   CARDIAC:  LVAD hum present.  LUNGS:  Clear to auscultation bilaterally.  ABDOMEN:  Soft, round, nontender, positive bowel sounds x4.     LVAD exit site: Dressing dry and intact.  No erythema or drainage.  Stabilization device present and accurately applied. Driveline dressing changed weekly. EXTREMITIES:  Warm and dry, no cyanosis, clubbing, rash, edema  NEUROLOGIC:  Alert and oriented x 4.  Gait steady.  No aphasia.  No  dysarthria.  Affect pleasant.     ASSESSMENT AND PLAN:  1) Chronic systolic HF: NICM, s/p ICD and LVAD for DT(04/2013).  Doing well currently.  NYHA II symptoms. Volume status stable.  Continue torsemide as needed. Not sure what episode last week represented: hemoglobin is stable, she did not have atrial fibrillation, no fever/chills/diarrhea/vomiting.  Possibly she had an episode of gastroenteritis given the lack of appetite + general malaise.  MAP stable.  -Continue 25 mg spironolactone daily. -Continue carvedilol 18.75 mg twice a day -Continue losartan 50 mg daily  -Continue hydralazine 100 mg three times a day.  - Continue weekly dressing changes.  - Reinforced the need and importance of daily weights, a low sodium diet, and fluid restriction (less than 2 L a day). Instructed to call the HF clinic if weight increases more than 3 lbs overnight or 5 lbs in a week.  2) LVAD placed for DT 04/2013: All parameters stable. - Check LDH, INR, CBC and BMET today.  - She is not on ASA, she is on warfarin INR 2-3.     3) PAF: Stable. Can decrease amiodarone to 100 mg daily.  Check LFTs/TSH. Needs year eye exams.    4) HTN: Stable. Continue current regimen.     Follow up in 2 months   Loralie Champagne  01/10/2015

## 2015-01-10 NOTE — Patient Instructions (Signed)
1.  No change in coumadin dose. Re-check INR on February 01, 2015 at [facility] lab. 2.  No change in meds. 3.  Return to [facility] in 2 months.

## 2015-01-19 ENCOUNTER — Encounter: Payer: Self-pay | Admitting: Internal Medicine

## 2015-01-19 ENCOUNTER — Ambulatory Visit (INDEPENDENT_AMBULATORY_CARE_PROVIDER_SITE_OTHER): Payer: Medicare Other | Admitting: Internal Medicine

## 2015-01-19 VITALS — BP 74/0 | HR 60 | Ht 60.0 in | Wt 182.8 lb

## 2015-01-19 DIAGNOSIS — I480 Paroxysmal atrial fibrillation: Secondary | ICD-10-CM

## 2015-01-19 DIAGNOSIS — I4901 Ventricular fibrillation: Secondary | ICD-10-CM

## 2015-01-19 DIAGNOSIS — I5022 Chronic systolic (congestive) heart failure: Secondary | ICD-10-CM

## 2015-01-19 LAB — CUP PACEART INCLINIC DEVICE CHECK
Battery Remaining Longevity: 52.8 mo
Brady Statistic RA Percent Paced: 33 %
Brady Statistic RV Percent Paced: 0.2 %
Date Time Interrogation Session: 20160519113040
HighPow Impedance: 41.3264
Lead Channel Impedance Value: 300 Ohm
Lead Channel Impedance Value: 312.5 Ohm
Lead Channel Pacing Threshold Amplitude: 0.75 V
Lead Channel Pacing Threshold Amplitude: 1 V
Lead Channel Pacing Threshold Pulse Width: 0.5 ms
Lead Channel Pacing Threshold Pulse Width: 0.5 ms
Lead Channel Sensing Intrinsic Amplitude: 0.9 mV
Lead Channel Sensing Intrinsic Amplitude: 10.8 mV
Lead Channel Setting Pacing Amplitude: 2 V
Lead Channel Setting Pacing Amplitude: 2.5 V
Lead Channel Setting Pacing Pulse Width: 0.5 ms
Lead Channel Setting Sensing Sensitivity: 0.5 mV
Pulse Gen Serial Number: 754815
Zone Setting Detection Interval: 300 ms
Zone Setting Detection Interval: 350 ms

## 2015-01-19 NOTE — Progress Notes (Signed)
[name]    Electrophysiology Office Note   Date:  01/19/2015   ID:  Brianna Hanson, DOB Oct 30, 1934, MRN [id number]  PCP:  Scarlette Calico, MD  Cardiologist:  Dr Haroldine Laws Primary Electrophysiologist: Thompson Grayer, MD    Chief Complaint  Patient presents with  . Atrial Fibrillation     History of Present Illness: Brianna Hanson is a 80 y.o. female who presents today for electrophysiology evaluation.   Doing amazingly well.  Rare edema but otherwise remains quite active.  Today, she denies symptoms of palpitations, chest pain, shortness of breath, orthopnea, PND,  claudication, dizziness, presyncope, syncope, bleeding, or neurologic sequela. The patient is tolerating medications without difficulties and is otherwise without complaint today.    Past Medical History  Diagnosis Date  . Cardiac arrest - ventricular fibrillation 12/10    with successful resucitation, S/p ICD  . ICD (implantable cardiac defibrillator) in place     she has received appropriate therapy for VF  . Atrial fibrillation or flutter   . Osteopenia   . HTN (hypertension)     moderate  . Nonischemic cardiomyopathy     followed by Dr April Holding at [facility]  . CHF (congestive heart failure)   . Anemia   . S/P colonoscopy   . Plasma cell disorder 03/20/2012  . Plasma cell disorder 03/20/2012   Past Surgical History  Procedure Laterality Date  . Abdominal hysterectomy    . Cardiac catheterization    . Cardiac defibrillator placement      by JA for secondary prevention of sudden death  . Insertion of implantable left ventricular assist device N/A 04/06/2013    Procedure: INSERTION OF IMPLANTABLE LEFT VENTRICULAR ASSIST DEVICE;  Surgeon: Gaye Pollack, MD;  Location: [place];  Service: Open Heart Surgery;  Laterality: N/A;  . Placement of centrimag ventricular assist device Right 04/06/2013    Procedure: PLACEMENT OF CENTRIMAG VENTRICULAR ASSIST DEVICE;  Surgeon: Gaye Pollack, MD;  Location: [place];  Service: Open Heart Surgery;   Laterality: Right;  . Intraoperative transesophageal echocardiogram N/A 04/06/2013    Procedure: INTRAOPERATIVE TRANSESOPHAGEAL ECHOCARDIOGRAM;  Surgeon: Gaye Pollack, MD;  Location: [facility] OR;  Service: Open Heart Surgery;  Laterality: N/A;     Current Outpatient Prescriptions  Medication Sig Dispense Refill  . amiodarone (PACERONE) 200 MG tablet Take 0.5 tablets (100 mg total) by mouth daily. 30 tablet 5  . amLODipine (NORVASC) 10 MG tablet TAKE 1 TABLET BY MOUTH TWICE A DAY 60 tablet 6  . carvedilol (COREG) 12.5 MG tablet Take 1.5 tabs twice daily (Patient taking differently: Take 1.5 tablets by mouth twice daily) 90 tablet 6  . Cholecalciferol (VITAMIN D3) 1000 UNITS CAPS Take 1 tablet by mouth daily.      [name] gabapentin (NEURONTIN) 100 MG capsule TAKE ONE CAPSULE BY MOUTH AT BEDTIME OR AS DIRECTED 30 capsule 6  . hydrALAZINE (APRESOLINE) 100 MG tablet TAKE 1 TABLET BY MOUTH 3 TIMES A DAY 90 tablet 6  . losartan (COZAAR) 50 MG tablet TAKE 1 TABLET BY MOUTH EVERY DAY 30 tablet 6  . pantoprazole (PROTONIX) 40 MG tablet TAKE 1 TABLET BY MOUTH EVERY DAY 30 tablet 6  . spironolactone (ALDACTONE) 25 MG tablet TAKE 1 TABLET BY MOUTH EVERY DAY 30 tablet 6  . torsemide (DEMADEX) 20 MG tablet Take 20 mg by mouth daily as needed. weight gain 3 lbs in 24 hours    . traMADol (ULTRAM) 50 MG tablet TAKE 1 OR 2 TABLETS BY MOUTH EVERY  6 HOURS AS NEEDED FOR PAIN 60 tablet 0  . warfarin (COUMADIN) 2.5 MG tablet Take one tab daily or as directed for INR 2.0 - 2.5 45 tablet 6   No current facility-administered medications for this visit.    Allergies:   Review of patient's allergies indicates no known allergies.   Social History:  The patient  reports that she has quit smoking. She has never used smokeless tobacco. She reports that she does not drink alcohol or use illicit drugs.   Family History:  [name] family history includes Stroke in her brother and mother.    ROS:  Please see the history of  present illness.   All other systems are reviewed and negative.    PHYSICAL EXAM: VS:  BP 74/0 mmHg  Pulse 60  Ht 5' (1.524 m)  Wt 182 lb 12.8 oz (82.918 kg)  BMI 35.70 kg/m2 , BMI Body mass index is 35.7 kg/(m^2). GEN: Well nourished, well developed, in no acute distress HEENT: normal Neck: no JVD, carotid bruits, or masses Cardiac:  + vad hum Respiratory:  clear to auscultation bilaterally, normal work of breathing GI: soft, nontender, nondistended, + BS MS: no deformity or atrophy Skin: warm and dry, device pocket is well healed Neuro:  Strength and sensation are intact Psych: euthymic mood, full affect  EKG:  EKG is ordered today. The ekg ordered today shows atrial pacing, nonspecific St/T changes  Device interrogation is reviewed today in detail.  See PaceArt for details.   Recent Labs: 07/11/2014: Pro B Natriuretic peptide (BNP) 505.6* 11/09/2014: ALT 25; TSH 3.214 01/10/2015: B Natriuretic Peptide 162.1*; BUN 14; Creatinine 1.48*; Hemoglobin 10.3*; Platelets 218; Potassium 3.8; Sodium 140    Lipid Panel     Component Value Date/Time   CHOL  05/05/2010 0400    130        ATP III CLASSIFICATION:  <200     mg/dL   Desirable  200-239  mg/dL   Borderline High  >=240    mg/dL   High          TRIG 90 05/05/2010 0400   HDL 36* 05/05/2010 0400   CHOLHDL 3.6 05/05/2010 0400   VLDL 18 05/05/2010 0400   LDLCALC  05/05/2010 0400    76        Total Cholesterol/HDL:CHD Risk Coronary Heart Disease Risk Table                     Men   Women  1/2 Average Risk   3.4   3.3  Average Risk       5.0   4.4  2 X Average Risk   9.6   7.1  3 X Average Risk  23.4   11.0        Use the calculated Patient Ratio above and the CHD Risk Table to determine the patient's CHD Risk.        ATP III CLASSIFICATION (LDL):  <100     mg/dL   Optimal  100-129  mg/dL   Near or Above                    Optimal  130-159  mg/dL   Borderline  160-189  mg/dL   High  >190     mg/dL   Very High      Wt Readings from Last 3 Encounters:  01/19/15 182 lb 12.8 oz (82.918 kg)  01/10/15 182 lb 9.6 oz (82.827 kg)  08/09/14 185 lb 9.6 oz (84.188 kg)      Other studies Reviewed: Additional studies/ records that were reviewed today include: CHF clinic note    ASSESSMENT AND PLAN:   Assessment and Plan:  1. Chronic systolic dysfunction Doing well s/p LVAD placement  2. Prior VF Normal ICD function See Pace Art report No changes today Continue amiodarone LFTs/TFTs to be followed by advanced heart failure team  3. afib No afib since last device interrogation Continue coumadin  Merlin check every 3 months I will see again in the device clinic in 1 year   Current medicines are reviewed at length with the patient today.   The patient does not have concerns regarding her medicines.  The following changes were made today:  none  Signed, Thompson Grayer, MD  01/19/2015 11:22 AM     [facility] [address] [place] [address] [phone] (office) [phone] (fax)

## 2015-01-19 NOTE — Patient Instructions (Signed)
Medication Instructions: No changes were made to your medications today.  Labwork: NONE  Testing/Procedures: NONE  Follow-Up: Your physician wants you to follow-up in: 12 months with Dr. Rayann Heman. You will receive a reminder letter in the mail two months in advance. If you don't receive a letter, please call our office to schedule the follow-up appointment.   Any Other Special Instructions Will Be Listed Below (If Applicable).  Remote monitoring is used to monitor your Pacemaker or ICD from home. This monitoring reduces the number of office visits required to check your device to one time per year. It allows [place] to keep an eye on the functioning of your device to ensure it is working properly. You are scheduled for a device check from home on 04/20/15. You may send your transmission at any time that day. If you have a wireless device, the transmission will be sent automatically. After your physician reviews your transmission, you will receive a postcard with your next transmission date.

## 2015-02-01 ENCOUNTER — Other Ambulatory Visit (INDEPENDENT_AMBULATORY_CARE_PROVIDER_SITE_OTHER): Payer: Medicare Other | Admitting: *Deleted

## 2015-02-01 ENCOUNTER — Ambulatory Visit (HOSPITAL_COMMUNITY): Payer: Self-pay | Admitting: *Deleted

## 2015-02-01 DIAGNOSIS — Z7901 Long term (current) use of anticoagulants: Secondary | ICD-10-CM

## 2015-02-01 DIAGNOSIS — Z95811 Presence of heart assist device: Secondary | ICD-10-CM | POA: Diagnosis not present

## 2015-02-01 LAB — PROTIME-INR
INR: 2.1 ratio — ABNORMAL HIGH (ref 0.8–1.0)
Prothrombin Time: 23.1 s — ABNORMAL HIGH (ref 9.6–13.1)

## 2015-02-01 NOTE — Addendum Note (Signed)
Addended by: Leanord Asal T on: 02/01/2015 10:22 AM   Modules accepted: Orders

## 2015-02-07 ENCOUNTER — Other Ambulatory Visit (HOSPITAL_COMMUNITY): Payer: Self-pay | Admitting: Adult Health

## 2015-02-08 ENCOUNTER — Ambulatory Visit (HOSPITAL_BASED_OUTPATIENT_CLINIC_OR_DEPARTMENT_OTHER): Payer: Medicare Other | Admitting: Oncology

## 2015-02-08 ENCOUNTER — Telehealth: Payer: Self-pay | Admitting: Oncology

## 2015-02-08 ENCOUNTER — Other Ambulatory Visit (HOSPITAL_BASED_OUTPATIENT_CLINIC_OR_DEPARTMENT_OTHER): Payer: Medicare Other

## 2015-02-08 VITALS — BP 88/65 | HR 60 | Temp 97.4°F | Resp 18 | Ht 60.0 in | Wt 183.6 lb

## 2015-02-08 DIAGNOSIS — I509 Heart failure, unspecified: Secondary | ICD-10-CM

## 2015-02-08 DIAGNOSIS — D472 Monoclonal gammopathy: Secondary | ICD-10-CM

## 2015-02-08 LAB — COMPREHENSIVE METABOLIC PANEL (CC13)
ALT: 26 U/L (ref 0–55)
AST: 31 U/L (ref 5–34)
Albumin: 3 g/dL — ABNORMAL LOW (ref 3.5–5.0)
Alkaline Phosphatase: 102 U/L (ref 40–150)
Anion Gap: 6 mEq/L (ref 3–11)
BUN: 15.5 mg/dL (ref 7.0–26.0)
CO2: 23 mEq/L (ref 22–29)
Calcium: 8.6 mg/dL (ref 8.4–10.4)
Chloride: 112 mEq/L — ABNORMAL HIGH (ref 98–109)
Creatinine: 1.3 mg/dL — ABNORMAL HIGH (ref 0.6–1.1)
EGFR: 48 mL/min/{1.73_m2} — ABNORMAL LOW (ref >90)
Glucose: 95 mg/dl (ref 70–140)
Potassium: 4.2 mEq/L (ref 3.5–5.1)
Sodium: 140 mEq/L (ref 136–145)
Total Bilirubin: 0.45 mg/dL (ref 0.20–1.20)
Total Protein: 7.2 g/dL (ref 6.4–8.3)

## 2015-02-08 LAB — CBC WITH DIFFERENTIAL/PLATELET
BASO%: 0.1 % (ref 0.0–2.0)
Basophils Absolute: 0 10*3/uL (ref 0.0–0.1)
EOS%: 2.3 % (ref 0.0–7.0)
Eosinophils Absolute: 0.2 10*3/uL (ref 0.0–0.5)
HCT: 28.8 % — ABNORMAL LOW (ref 34.8–46.6)
HGB: 9.1 g/dL — ABNORMAL LOW (ref 11.6–15.9)
LYMPH%: 24.2 % (ref 14.0–49.7)
MCH: 27.7 pg (ref 25.1–34.0)
MCHC: 31.6 g/dL (ref 31.5–36.0)
MCV: 87.8 fL (ref 79.5–101.0)
MONO#: 0.6 10*3/uL (ref 0.1–0.9)
MONO%: 9 % (ref 0.0–14.0)
NEUT#: 4.4 10*3/uL (ref 1.5–6.5)
NEUT%: 64.4 % (ref 38.4–76.8)
Platelets: 217 10*3/uL (ref 145–400)
RBC: 3.28 10*6/uL — ABNORMAL LOW (ref 3.70–5.45)
RDW: 18.3 % — ABNORMAL HIGH (ref 11.2–14.5)
WBC: 6.9 10*3/uL (ref 3.9–10.3)
lymph#: 1.7 10*3/uL (ref 0.9–3.3)

## 2015-02-08 NOTE — Telephone Encounter (Signed)
Pt confirmed labs/ov per 06/08 POF, gave pt AVS and Calendar..... KJ °

## 2015-02-08 NOTE — Progress Notes (Signed)
Hematology and Oncology Follow Up Visit  Brianna Hanson [id number] 1935/04/09 80 y.o. 02/08/2015 9:46 AM     Principle Diagnosis: 80 year old with plasma cell disorder. She presented with IgA MGUS without evidence of multiple myeloma diagnosed in 2013. She presented with an M spike of 0.65 g/dL and IgG level of 849.  Current therapy: Observation and surveillance.   Secondary diagnosis: Systolic heart failure currently on LVAD.  Interim History: Brianna Hanson returns today for a follow up visit. Since her last visit, she reports no complaints. She remains on LVAD for her heart failure without any issues or hospitalizations. She is doing well and adjusting her lifestyle to this. This continues to improve her quality of life dramatically.  She is not reporting any pathological fracture. Has not reported any change in her activity level or performance status. She is rather functional and able to perform most activities of daily living without any hindrance or decline. She had not had any peripheral neuropathy. Had not reported any immune dysregulation or recurrent sinopulmonary infection. He does not report any headaches blurred vision no vision. For that report any chest pain shortness of breath cough or hemoptysis. She does report lower extremity edema which is chronic. She has not reported any hemoptysis or hematemesis. She does not report any nausea or vomiting or abdominal pain. Has not reported any frequency urgency or hesitancy. Rest of her review of system is unremarkable.  Medications: I have reviewed the patient's current medications. Current Outpatient Prescriptions  Medication Sig Dispense Refill  . amiodarone (PACERONE) 200 MG tablet Take 0.5 tablets (100 mg total) by mouth daily. 30 tablet 5  . amLODipine (NORVASC) 10 MG tablet TAKE 1 TABLET BY MOUTH TWICE A DAY 60 tablet 6  . carvedilol (COREG) 12.5 MG tablet Take 1.5 tabs twice daily (Patient taking differently: Take 1.5 tablets by mouth  twice daily) 90 tablet 6  . Cholecalciferol (VITAMIN D3) 1000 UNITS CAPS Take 1 tablet by mouth daily.      [name] gabapentin (NEURONTIN) 100 MG capsule TAKE 1 CAPSULE BY MOUTH AT BEDTIME OR AS DIRECTED 30 capsule 6  . hydrALAZINE (APRESOLINE) 100 MG tablet TAKE 1 TABLET BY MOUTH 3 TIMES A DAY 90 tablet 6  . losartan (COZAAR) 50 MG tablet TAKE 1 TABLET BY MOUTH EVERY DAY 30 tablet 6  . pantoprazole (PROTONIX) 40 MG tablet TAKE 1 TABLET BY MOUTH EVERY DAY 30 tablet 6  . spironolactone (ALDACTONE) 25 MG tablet TAKE 1 TABLET BY MOUTH EVERY DAY 30 tablet 6  . torsemide (DEMADEX) 20 MG tablet Take 20 mg by mouth daily as needed. weight gain 3 lbs in 24 hours    . traMADol (ULTRAM) 50 MG tablet TAKE 1 OR 2 TABLETS BY MOUTH EVERY 6 HOURS AS NEEDED FOR PAIN 60 tablet 0  . warfarin (COUMADIN) 2.5 MG tablet Take one tab daily or as directed for INR 2.0 - 2.5 45 tablet 6   No current facility-administered medications for this visit.    Allergies: No Known Allergies  Past Medical History, Surgical history, Social history, and Family History were reviewed and updated.  Physical Exam: Blood pressure 88/65, pulse 60, temperature 97.4 F (36.3 C), temperature source Oral, resp. rate 18, height 5' (1.524 m), weight 183 lb 9.6 oz (83.28 kg), SpO2 100 %. ECOG: 1 General appearance: alert awake not in any distress. Head: Normocephalic, without obvious abnormality, atraumatic Neck: no adenopathy Lymph nodes: Cervical, supraclavicular, and axillary nodes normal. Heart:regular rate and rhythm, S1,  S2 normal, no murmur, click, rub or gallop Lung:chest clear, no wheezing, rales, normal symmetric air entry Abdomin: soft, non-tender, without masses or organomegaly EXT:no erythema, induration, or nodules. Trace edema noted.   Lab Results: Lab Results  Component Value Date   WBC 6.9 02/08/2015   HGB 9.1* 02/08/2015   HCT 28.8* 02/08/2015   MCV 87.8 02/08/2015   PLT 217 02/08/2015     Chemistry       Component Value Date/Time   NA 140 01/10/2015 1000   NA 139 08/09/2014 0926   K 3.8 01/10/2015 1000   K 4.0 08/09/2014 0926   CL 111 01/10/2015 1000   CL 110* 02/10/2013 1053   CO2 22 01/10/2015 1000   CO2 22 08/09/2014 0926   BUN 14 01/10/2015 1000   BUN 17.0 08/09/2014 0926   CREATININE 1.48* 01/10/2015 1000   CREATININE 1.4* 08/09/2014 0926      Component Value Date/Time   CALCIUM 9.0 01/10/2015 1000   CALCIUM 8.9 08/09/2014 0926   ALKPHOS 94 11/09/2014 1201   ALKPHOS 106 08/09/2014 0926   AST 28 11/09/2014 1201   AST 25 08/09/2014 0926   ALT 25 11/09/2014 1201   ALT 25 08/09/2014 0926   BILITOT 0.4 11/09/2014 1201   BILITOT 0.41 08/09/2014 0926       Results for Perrone, DESTRY BEZDEK (MRN [id number]) as of 02/08/2015 09:34  Ref. Range 02/10/2013 10:53 08/12/2013 08:53 02/08/2014 09:13 08/09/2014 09:26  M-SPIKE, % Latest Units: g/dL 0.71 1.01 1.01 0.96  SPE Interp. Unknown * * * *  IgG (Immunoglobin G), Serum Latest Ref Range: [phone] mg/dL 1180 1320 1240 1230  IgA Latest Ref Range: 69-380 mg/dL 696 (H) 1440 (H) 1270 (H) 1420 (H)     Impression and Plan:  A 80 year old woman with the following issues:   1. Monoclonal gammopathy, IgA subtype with an M-spike of 1.0 g/dL, IgA level of 746. The differential diagnosis include multiple myeloma  vs. monoclonal gammopathy of undetermined significance.  She has no end organ damage to suggest multiple myeloma but she does have a risk of developing that in the future.This risk is slightly higher given the IgA subtype. Her protein studies appear to be stable in the last 3 years. This can change at any time and if she does so, she will need a bone marrow biopsy and skeletal survey. She is asymptomatic from her disease in any case and would defer treatment at the time being we will continue to monitor her closely for this condition.  2. CHF: she is S/P LVAD placement without complications. This is improving her heart function and tolerating  it well.  3. Followup: Will be in 6 months for repeat protein studies.      [facility], MD 6/8/20169:46 AM

## 2015-02-10 LAB — SPEP & IFE WITH QIG
Abnormal Protein Band1: 1 g/dL
Albumin ELP: 3.4 g/dL — ABNORMAL LOW (ref 3.8–4.8)
Alpha-1-Globulin: 0.3 g/dL (ref 0.2–0.3)
Alpha-2-Globulin: 0.5 g/dL (ref 0.5–0.9)
Beta 2: 1.6 g/dL — ABNORMAL HIGH (ref 0.2–0.5)
Beta Globulin: 0.4 g/dL (ref 0.4–0.6)
Gamma Globulin: 0.9 g/dL (ref 0.8–1.7)
IgA: 1440 mg/dL — ABNORMAL HIGH (ref 69–380)
IgG (Immunoglobin G), Serum: 1200 mg/dL (ref 690–1700)
IgM, Serum: 59 mg/dL (ref 52–322)
Total Protein, Serum Electrophoresis: 7.1 g/dL (ref 6.1–8.1)

## 2015-02-10 LAB — KAPPA/LAMBDA LIGHT CHAINS
Kappa free light chain: 98.4 mg/dL — ABNORMAL HIGH (ref 0.33–1.94)
Kappa:Lambda Ratio: 40.16 — ABNORMAL HIGH (ref 0.26–1.65)
Lambda Free Lght Chn: 2.45 mg/dL (ref 0.57–2.63)

## 2015-02-14 ENCOUNTER — Other Ambulatory Visit: Payer: Medicare Other

## 2015-02-21 ENCOUNTER — Ambulatory Visit (HOSPITAL_COMMUNITY): Payer: Self-pay | Admitting: Infectious Diseases

## 2015-02-21 ENCOUNTER — Other Ambulatory Visit (INDEPENDENT_AMBULATORY_CARE_PROVIDER_SITE_OTHER): Payer: Medicare Other | Admitting: *Deleted

## 2015-02-21 DIAGNOSIS — Z7901 Long term (current) use of anticoagulants: Secondary | ICD-10-CM

## 2015-02-21 DIAGNOSIS — Z95811 Presence of heart assist device: Secondary | ICD-10-CM | POA: Diagnosis not present

## 2015-02-21 LAB — PROTIME-INR
INR: 1.9 ratio — ABNORMAL HIGH (ref 0.8–1.0)
Prothrombin Time: 20.9 s — ABNORMAL HIGH (ref 9.6–13.1)

## 2015-02-21 NOTE — Addendum Note (Signed)
Addended by: Eulis Foster on: 02/21/2015 09:55 AM   Modules accepted: Orders

## 2015-02-28 ENCOUNTER — Other Ambulatory Visit (HOSPITAL_COMMUNITY): Payer: Self-pay | Admitting: Infectious Diseases

## 2015-02-28 ENCOUNTER — Other Ambulatory Visit: Payer: Medicare Other

## 2015-02-28 DIAGNOSIS — Z7901 Long term (current) use of anticoagulants: Secondary | ICD-10-CM

## 2015-03-01 ENCOUNTER — Ambulatory Visit (HOSPITAL_COMMUNITY): Payer: Self-pay | Admitting: *Deleted

## 2015-03-01 ENCOUNTER — Other Ambulatory Visit (INDEPENDENT_AMBULATORY_CARE_PROVIDER_SITE_OTHER): Payer: Medicare Other | Admitting: *Deleted

## 2015-03-01 DIAGNOSIS — Z7901 Long term (current) use of anticoagulants: Secondary | ICD-10-CM

## 2015-03-01 LAB — PROTIME-INR
INR: 1.9 ratio — ABNORMAL HIGH (ref 0.8–1.0)
Prothrombin Time: 21.1 s — ABNORMAL HIGH (ref 9.6–13.1)

## 2015-03-01 NOTE — Addendum Note (Signed)
Addended by: Eulis Foster on: 03/01/2015 10:48 AM   Modules accepted: Orders

## 2015-03-09 ENCOUNTER — Other Ambulatory Visit (HOSPITAL_COMMUNITY): Payer: Self-pay | Admitting: Infectious Diseases

## 2015-03-09 ENCOUNTER — Encounter (HOSPITAL_COMMUNITY): Payer: Self-pay | Admitting: Infectious Diseases

## 2015-03-09 ENCOUNTER — Ambulatory Visit (HOSPITAL_COMMUNITY): Payer: Self-pay | Admitting: Infectious Diseases

## 2015-03-09 ENCOUNTER — Ambulatory Visit (HOSPITAL_COMMUNITY)
Admission: RE | Admit: 2015-03-09 | Discharge: 2015-03-09 | Disposition: A | Discharge status: Home / Self Care | Payer: Medicare Other | Source: Ambulatory Visit | Attending: Cardiology | Admitting: Cardiology

## 2015-03-09 ENCOUNTER — Encounter (HOSPITAL_COMMUNITY): Payer: Self-pay

## 2015-03-09 VITALS — BP 76/0 | HR 60 | Ht 60.0 in | Wt 181.8 lb

## 2015-03-09 DIAGNOSIS — Z95811 Presence of heart assist device: Secondary | ICD-10-CM

## 2015-03-09 DIAGNOSIS — I480 Paroxysmal atrial fibrillation: Secondary | ICD-10-CM | POA: Diagnosis not present

## 2015-03-09 DIAGNOSIS — Z7901 Long term (current) use of anticoagulants: Secondary | ICD-10-CM

## 2015-03-09 DIAGNOSIS — I10 Essential (primary) hypertension: Secondary | ICD-10-CM | POA: Diagnosis not present

## 2015-03-09 DIAGNOSIS — M8580 Other specified disorders of bone density and structure, unspecified site: Secondary | ICD-10-CM | POA: Diagnosis not present

## 2015-03-09 DIAGNOSIS — I5022 Chronic systolic (congestive) heart failure: Secondary | ICD-10-CM | POA: Diagnosis not present

## 2015-03-09 DIAGNOSIS — Z79899 Other long term (current) drug therapy: Secondary | ICD-10-CM | POA: Diagnosis not present

## 2015-03-09 DIAGNOSIS — Z95810 Presence of automatic (implantable) cardiac defibrillator: Secondary | ICD-10-CM | POA: Diagnosis not present

## 2015-03-09 DIAGNOSIS — I429 Cardiomyopathy, unspecified: Secondary | ICD-10-CM | POA: Insufficient documentation

## 2015-03-09 DIAGNOSIS — D472 Monoclonal gammopathy: Secondary | ICD-10-CM | POA: Diagnosis not present

## 2015-03-09 LAB — BASIC METABOLIC PANEL
Anion gap: 8 (ref 5–15)
BUN: 19 mg/dL (ref 6–20)
CO2: 21 mmol/L — ABNORMAL LOW (ref 22–32)
Calcium: 8.8 mg/dL — ABNORMAL LOW (ref 8.9–10.3)
Chloride: 110 mmol/L (ref 101–111)
Creatinine, Ser: 1.68 mg/dL — ABNORMAL HIGH (ref 0.44–1.00)
GFR calc Af Amer: 33 mL/min — ABNORMAL LOW (ref >60)
GFR calc non Af Amer: 28 mL/min — ABNORMAL LOW (ref >60)
Glucose, Bld: 112 mg/dL — ABNORMAL HIGH (ref 65–99)
Potassium: 3.7 mmol/L (ref 3.5–5.1)
Sodium: 139 mmol/L (ref 135–145)

## 2015-03-09 LAB — PROTIME-INR
INR: 2.26 — ABNORMAL HIGH (ref 0.00–1.49)
Prothrombin Time: 24.8 seconds — ABNORMAL HIGH (ref 11.6–15.2)

## 2015-03-09 LAB — CBC
HCT: 28.1 % — ABNORMAL LOW (ref 36.0–46.0)
Hemoglobin: 8.9 g/dL — ABNORMAL LOW (ref 12.0–15.0)
MCH: 26.6 pg (ref 26.0–34.0)
MCHC: 31.7 g/dL (ref 30.0–36.0)
MCV: 84.1 fL (ref 78.0–100.0)
Platelets: 227 10*3/uL (ref 150–400)
RBC: 3.34 MIL/uL — ABNORMAL LOW (ref 3.87–5.11)
RDW: 18.1 % — ABNORMAL HIGH (ref 11.5–15.5)
WBC: 5.6 10*3/uL (ref 4.0–10.5)

## 2015-03-09 LAB — LACTATE DEHYDROGENASE: LDH: 200 U/L — ABNORMAL HIGH (ref 98–192)

## 2015-03-09 NOTE — Progress Notes (Signed)
Patient ID: Brianna Hanson, female   DOB: 08-29-1935, 80 y.o.   MRN: [id number] Primary Heart Failure: Brianna Hanson Cardiac Surgeon: Brianna Hanson HPI: Brianna Hanson is a 80 year old female with a PMH of HF due to severe NICM, chronic systolic HF,  PAF,  plasma cell disorder (Likely IgA MGUS) - saw Brianna Hanson on 08/12/13 for routine follow up; no complications, plans to see her in 6 mos and repeat protein studies. Underwent implantation of the HeartMate II LVAD on 04/06/13 for DT. She is not on aspirin due to dizziness.   She sees hem-onc for monoclonal gammopathy with followup every 6 months.   She returns routine LVAD follow up. Overall feeling pretty good. Denies SOB/PND/Orthopnea. No bleeding problems. Significant other performs weekly dressing changes. Walks one mile a couple times a week.   Denies LVAD alarms.  Denies driveline trauma, erythema or drainage.  Denies ICD shocks.   Reports taking Coumadin as prescribed.  Denies bright red blood per rectum or melena, no dark urine or hematuria.    Echo (7/15) with LV EF 30%.    Past Medical History  Diagnosis Date  . Cardiac arrest - ventricular fibrillation 12/10    with successful resucitation, S/p ICD  . ICD (implantable cardiac defibrillator) in place     she has received appropriate therapy for VF  . Atrial fibrillation or flutter   . Osteopenia   . HTN (hypertension)     moderate  . Nonischemic cardiomyopathy     followed by Brianna Brianna Hanson at [facility]  . CHF (congestive heart failure)   . Anemia   . S/P colonoscopy   . Plasma cell disorder 03/20/2012  . Plasma cell disorder 03/20/2012    Current Outpatient Prescriptions  Medication Sig Dispense Refill  . amiodarone (PACERONE) 200 MG tablet Take 0.5 tablets (100 mg total) by mouth daily. 30 tablet 5  . amLODipine (NORVASC) 10 MG tablet TAKE 1 TABLET BY MOUTH TWICE A DAY 60 tablet 6  . carvedilol (COREG) 12.5 MG tablet Take 1.5 tabs twice daily (Patient taking differently: Take 1.5 tablets  by mouth twice daily) 90 tablet 6  . Cholecalciferol (VITAMIN D3) 1000 UNITS CAPS Take 1 tablet by mouth daily.      [name] gabapentin (NEURONTIN) 100 MG capsule TAKE 1 CAPSULE BY MOUTH AT BEDTIME OR AS DIRECTED 30 capsule 6  . hydrALAZINE (APRESOLINE) 100 MG tablet TAKE 1 TABLET BY MOUTH 3 TIMES A DAY 90 tablet 6  . losartan (COZAAR) 50 MG tablet TAKE 1 TABLET BY MOUTH EVERY DAY 30 tablet 6  . pantoprazole (PROTONIX) 40 MG tablet TAKE 1 TABLET BY MOUTH EVERY DAY 30 tablet 6  . spironolactone (ALDACTONE) 25 MG tablet TAKE 1 TABLET BY MOUTH EVERY DAY 30 tablet 6  . torsemide (DEMADEX) 20 MG tablet Take 20 mg by mouth daily as needed. weight gain 3 lbs in 24 hours    . traMADol (ULTRAM) 50 MG tablet TAKE 1 OR 2 TABLETS BY MOUTH EVERY 6 HOURS AS NEEDED FOR PAIN 60 tablet 0  . warfarin (COUMADIN) 2.5 MG tablet Take one tab daily or as directed for INR 2.0 - 2.5 45 tablet 6   No current facility-administered medications for this encounter.    Review of patient's allergies indicates no known allergies.  REVIEW OF SYSTEMS: All systems negative except as listed in HPI, PMH and Problem list.  Filed Vitals:   03/09/15 1018  BP: 76/0  Height: 5' (1.524 m)  Weight: 181 lb  12.8 oz (82.464 kg)    LVAD parameters: Pump Power 5.6  Pump Power 9200 Pulse Index 6.7  Pump Power 5.7  Rare PI events   Physical Exam: MAP 76 GENERAL: Well appearing, female, NAD, Brianna Hanson present HEENT: normal  NECK: Supple, JVP 5-6,  2+ bilaterally, no bruits.  No lymphadenopathy or thyromegaly appreciated.   CARDIAC:  LVAD hum present.  LUNGS:  Clear to auscultation bilaterally.  ABDOMEN:  Soft, round, nontender, positive bowel sounds x4.     LVAD exit site: Dressing dry and intact.  No erythema or drainage.  Stabilization device present and accurately applied. Driveline dressing changed weekly. EXTREMITIES:  Warm and dry, no cyanosis, clubbing, rash, edema  NEUROLOGIC:  Alert and oriented x 4.  Gait steady.  No aphasia.   No dysarthria.  Affect pleasant.     ASSESSMENT AND PLAN:  1) Chronic systolic HF: NICM, s/p ICD and LVAD for DT(04/2013).  Doing well currently.  NYHA II symptoms. Volume status stable.  Continue torsemide as needed.   -Continue 25 mg spironolactone daily. -Continue carvedilol 18.75 mg twice a day -Continue losartan 50 mg daily  -Continue hydralazine 100 mg three times a day.  - Continue weekly dressing changes.  - Reinforced the need and importance of daily weights, a low sodium diet, and fluid restriction (less than 2 L a day). Instructed to call the HF clinic if weight increases more than 3 lbs overnight or 5 lbs in a week.  2) LVAD placed for DT 04/2013: All parameters stable. - Check LDH, INR, CBC and BMET today.  Remains on coumadin.   Not on aspirin due to intolerance. 3) PAF: Stable. Continue amiodarone to 100 mg daily. Needs tsh in 4 months.  Needs year eye exams.    4) HTN: Stable. Continue current regimen.    5) Chronic Anticoagulation: Goal INR 2-3 Continue coumadin. Check INR   Follow up in 2 months with an ECHO for 2 year visit.     Brianna Ruddell  NP-C  03/09/2015

## 2015-03-09 NOTE — Progress Notes (Signed)
Symptom  Yes  No  Details   Angina        x Activity:   Claudication        x How far:   Syncope        x When:   Stroke        x   Orthopnea        x How many pillows:  2  PND        x How often:  CPAP      N/A How many hrs:   Pedal edema        x       occasionally; clears overnight  Abd fullness        x   N&V        x   Diaphoresis        x          When:  Occasional night sweat   Bleeding       x   Urine color   Light yellow  SOB        x Activity:   Palpitations        x When:  ICD shock        x   Hospitlizaitons        x When/where/why:  ED visit        x When/where/why:  Other MD              x   When/who/why:  Activity    Walk 1 mile 2x week, maintaining activity currently   Fluid    No limitaions  Diet    No added salt; Appetite improved since last visit.    Vital Signs: MAP:  76 Auto BP: 105/45 (65) HR: 60 SPO2: 95%  Weight:  182.6  lbs Last weight:  182.6  Lbs  LVAD interrogation & Equipment Maintenance:  Speed:  9200 Flow:  6.0 Power: 6.1 PI: 6.5 Alarms:  none Events:   0 - 3  PI events rarely   Fixed speed:  9200 Low speed limit:  8600 Primary Controller:  Replace back up battery in 12 months (Jan/2017) Back up controller:   Replace back up battery in 12 months (Jan/2017)  I reviewed the LVAD parameters from today, and compared the results to the patient's prior recorded data. No programming changes were made. The LVAD is functioning within specified parameters. LVAD interrogation was negative for any significant power changes, alarms or PI events/speed drops.  LVAD equipment check completed and is in good working order. Back-up equipment present. LVAD education done on emergency procedures and precautions and reviewed exit site care.    Implant Date: 04/06/2013 Implanting Surgeon: Cyndia Bent Indication for Therapy: DT  LVAD exit site: Well healed and incorporated. The velour is fully implanted at exit site. Dressing dry and intact. No erythema or drainage.  Stabilization device present and accurately applied. Driveline dressing is being changed weekly per sterile technique using Sorbaview dressing with biopatch on exit site. Pt denies fever or chills. Pt/caregiver provided adequate dressing supplies for home.  Encounter Details:  Presents today for her 2 month follow up visit with her caregiver and husband [name]. Feels well on support and has been very stable since last visit. Following up with PCP, Nephrology and Heme/Onc for other chronic conditions. She is compliant with medications. Examined by Darrick Grinder in clinic today. No medication changes were made. INR stable 2.26 and will be rechecked in 3 weeks.  Next visit will be her 2 year INTERMACs visit. Informed them to bring PM/UBC to clinic next visit for annual maintenance.  Full annual echo to be scheduled at next visit also.   RTC in 2 months   Janene Madeira, RN VAD Coordinator

## 2015-03-09 NOTE — Patient Instructions (Addendum)
1. You look great today! No medication changes, no program changes on LVAD.   2. INR is perfect 2.26. Check INR at [facility] Thursday 03/30/15--we will schedule your appointment   3. Return to clinic in 2 months for your 2 year celebration. We will do your annual echo (heart ultrasound).   Please bring your Power Module, Battery charger, and patient cable so we can perform annual maintenance for you.

## 2015-03-09 NOTE — Progress Notes (Signed)
Pt is scheduled for Complete Echocardiogram on 05/11/15 for annual LVAD surveillance.    CPT Code: 26378 ICD 10 Code: H88.502  NPCR for Medicare or BCBS supplemental. Orders entered.

## 2015-03-25 ENCOUNTER — Other Ambulatory Visit (HOSPITAL_COMMUNITY): Payer: Self-pay | Admitting: Internal Medicine

## 2015-03-27 NOTE — Telephone Encounter (Signed)
bensimhon refill. Thank you.

## 2015-03-30 ENCOUNTER — Ambulatory Visit (HOSPITAL_COMMUNITY): Payer: Self-pay | Admitting: Infectious Diseases

## 2015-03-30 ENCOUNTER — Telehealth (HOSPITAL_COMMUNITY): Payer: Self-pay | Admitting: Infectious Diseases

## 2015-03-30 ENCOUNTER — Other Ambulatory Visit (INDEPENDENT_AMBULATORY_CARE_PROVIDER_SITE_OTHER): Payer: Medicare Other | Admitting: *Deleted

## 2015-03-30 DIAGNOSIS — R791 Abnormal coagulation profile: Secondary | ICD-10-CM

## 2015-03-30 DIAGNOSIS — Z7901 Long term (current) use of anticoagulants: Secondary | ICD-10-CM | POA: Diagnosis not present

## 2015-03-30 DIAGNOSIS — Z95811 Presence of heart assist device: Secondary | ICD-10-CM

## 2015-03-30 LAB — PROTIME-INR
INR: 1.6 ratio — ABNORMAL HIGH (ref 0.8–1.0)
Prothrombin Time: 17.4 s — ABNORMAL HIGH (ref 9.6–13.1)

## 2015-03-30 MED ORDER — ENOXAPARIN SODIUM 80 MG/0.8ML ~~LOC~~ SOLN: 70.0000 mg | Freq: Two times a day (BID) | SUBCUTANEOUS | Status: DC

## 2015-03-30 NOTE — Addendum Note (Signed)
Addended by: Eulis Foster on: 03/30/2015 12:34 PM   Modules accepted: Orders

## 2015-03-30 NOTE — Telephone Encounter (Signed)
A user error has taken place: encounter opened in error, closed for administrative reasons.

## 2015-04-03 ENCOUNTER — Other Ambulatory Visit: Payer: Medicare Other

## 2015-04-03 ENCOUNTER — Telehealth (HOSPITAL_COMMUNITY): Payer: Self-pay | Admitting: *Deleted

## 2015-04-03 NOTE — Telephone Encounter (Signed)
Returned pt's call re: INR appt at [facility] today; pt did not get phone call with lab appt time and did not get INR drawn today. Appt re-scheduled for 10:00am tomorrow for INR check at [facility]; order entered.  Pt verbalized understanding of same.

## 2015-04-04 ENCOUNTER — Ambulatory Visit (HOSPITAL_COMMUNITY): Payer: Self-pay | Admitting: *Deleted

## 2015-04-04 ENCOUNTER — Other Ambulatory Visit: Payer: Self-pay | Admitting: Internal Medicine

## 2015-04-04 ENCOUNTER — Other Ambulatory Visit (INDEPENDENT_AMBULATORY_CARE_PROVIDER_SITE_OTHER): Payer: Medicare Other | Admitting: *Deleted

## 2015-04-04 DIAGNOSIS — Z95811 Presence of heart assist device: Secondary | ICD-10-CM

## 2015-04-04 DIAGNOSIS — Z7901 Long term (current) use of anticoagulants: Secondary | ICD-10-CM | POA: Diagnosis not present

## 2015-04-04 DIAGNOSIS — R791 Abnormal coagulation profile: Secondary | ICD-10-CM | POA: Diagnosis not present

## 2015-04-04 LAB — PROTIME-INR
INR: 2.5 ratio — ABNORMAL HIGH (ref 0.8–1.0)
Prothrombin Time: 27.4 s — ABNORMAL HIGH (ref 9.6–13.1)

## 2015-04-04 NOTE — Addendum Note (Signed)
Addended by: Eulis Foster on: 04/04/2015 09:50 AM   Modules accepted: Orders

## 2015-04-18 ENCOUNTER — Other Ambulatory Visit (HOSPITAL_COMMUNITY): Payer: Self-pay | Admitting: *Deleted

## 2015-04-18 ENCOUNTER — Other Ambulatory Visit (INDEPENDENT_AMBULATORY_CARE_PROVIDER_SITE_OTHER): Payer: Medicare Other | Admitting: *Deleted

## 2015-04-18 ENCOUNTER — Ambulatory Visit: Payer: Self-pay | Admitting: *Deleted

## 2015-04-18 DIAGNOSIS — Z95811 Presence of heart assist device: Secondary | ICD-10-CM

## 2015-04-18 DIAGNOSIS — Z7901 Long term (current) use of anticoagulants: Secondary | ICD-10-CM | POA: Diagnosis not present

## 2015-04-18 LAB — PROTIME-INR
INR: 1.9 ratio — ABNORMAL HIGH (ref 0.8–1.0)
Prothrombin Time: 20.4 s — ABNORMAL HIGH (ref 9.6–13.1)

## 2015-04-18 NOTE — Addendum Note (Signed)
Addended by: Eulis Foster on: 04/18/2015 09:52 AM   Modules accepted: Orders

## 2015-04-20 ENCOUNTER — Ambulatory Visit (INDEPENDENT_AMBULATORY_CARE_PROVIDER_SITE_OTHER): Payer: Medicare Other | Admitting: *Deleted

## 2015-04-20 DIAGNOSIS — I429 Cardiomyopathy, unspecified: Secondary | ICD-10-CM | POA: Diagnosis not present

## 2015-04-20 DIAGNOSIS — I4901 Ventricular fibrillation: Secondary | ICD-10-CM | POA: Diagnosis not present

## 2015-04-20 DIAGNOSIS — I5022 Chronic systolic (congestive) heart failure: Secondary | ICD-10-CM

## 2015-04-20 NOTE — Progress Notes (Signed)
Remote ICD transmission.   

## 2015-04-27 DIAGNOSIS — H25012 Cortical age-related cataract, left eye: Secondary | ICD-10-CM | POA: Diagnosis not present

## 2015-04-27 DIAGNOSIS — H52202 Unspecified astigmatism, left eye: Secondary | ICD-10-CM | POA: Diagnosis not present

## 2015-04-27 DIAGNOSIS — H2513 Age-related nuclear cataract, bilateral: Secondary | ICD-10-CM | POA: Diagnosis not present

## 2015-04-27 DIAGNOSIS — H52201 Unspecified astigmatism, right eye: Secondary | ICD-10-CM | POA: Diagnosis not present

## 2015-04-28 LAB — CUP PACEART REMOTE DEVICE CHECK
Battery Remaining Longevity: 45 mo
Battery Remaining Percentage: 45 %
Battery Voltage: 2.93 V
Brady Statistic AP VP Percent: 1 %
Brady Statistic AP VS Percent: 35 %
Brady Statistic AS VP Percent: 1 %
Brady Statistic AS VS Percent: 64 %
Brady Statistic RA Percent Paced: 33 %
Brady Statistic RV Percent Paced: 1 %
Date Time Interrogation Session: 20160818072226
HighPow Impedance: 41 Ohm
Lead Channel Impedance Value: 290 Ohm
Lead Channel Impedance Value: 300 Ohm
Lead Channel Pacing Threshold Amplitude: 0.75 V
Lead Channel Pacing Threshold Amplitude: 1 V
Lead Channel Pacing Threshold Pulse Width: 0.5 ms
Lead Channel Pacing Threshold Pulse Width: 0.5 ms
Lead Channel Sensing Intrinsic Amplitude: 0.5 mV
Lead Channel Sensing Intrinsic Amplitude: 10.3 mV
Lead Channel Setting Pacing Amplitude: 2 V
Lead Channel Setting Pacing Amplitude: 2.5 V
Lead Channel Setting Pacing Pulse Width: 0.5 ms
Lead Channel Setting Sensing Sensitivity: 0.5 mV
Pulse Gen Serial Number: 754815
Zone Setting Detection Interval: 300 ms
Zone Setting Detection Interval: 350 ms

## 2015-05-02 ENCOUNTER — Ambulatory Visit (HOSPITAL_COMMUNITY): Payer: Self-pay | Admitting: Infectious Diseases

## 2015-05-02 ENCOUNTER — Other Ambulatory Visit (INDEPENDENT_AMBULATORY_CARE_PROVIDER_SITE_OTHER): Payer: Medicare Other | Admitting: *Deleted

## 2015-05-02 DIAGNOSIS — Z7901 Long term (current) use of anticoagulants: Secondary | ICD-10-CM

## 2015-05-02 DIAGNOSIS — Z95811 Presence of heart assist device: Secondary | ICD-10-CM | POA: Diagnosis not present

## 2015-05-02 LAB — PROTIME-INR
INR: 2.3 ratio — ABNORMAL HIGH (ref 0.8–1.0)
Prothrombin Time: 25.2 s — ABNORMAL HIGH (ref 9.6–13.1)

## 2015-05-02 NOTE — Addendum Note (Signed)
Addended by: Eulis Foster on: 05/02/2015 08:59 AM   Modules accepted: Orders

## 2015-05-04 ENCOUNTER — Other Ambulatory Visit (HOSPITAL_COMMUNITY): Payer: Self-pay | Admitting: Internal Medicine

## 2015-05-04 ENCOUNTER — Encounter: Payer: Self-pay | Admitting: Cardiology

## 2015-05-11 ENCOUNTER — Other Ambulatory Visit (HOSPITAL_COMMUNITY): Payer: Self-pay | Admitting: Infectious Diseases

## 2015-05-11 ENCOUNTER — Ambulatory Visit (HOSPITAL_COMMUNITY)
Admission: RE | Admit: 2015-05-11 | Discharge: 2015-05-11 | Disposition: A | Discharge status: Home / Self Care | Payer: Medicare Other | Source: Ambulatory Visit | Attending: Internal Medicine | Admitting: Internal Medicine

## 2015-05-11 ENCOUNTER — Ambulatory Visit (HOSPITAL_COMMUNITY): Payer: Self-pay | Admitting: Infectious Diseases

## 2015-05-11 ENCOUNTER — Telehealth (HOSPITAL_COMMUNITY): Payer: Self-pay | Admitting: Infectious Diseases

## 2015-05-11 ENCOUNTER — Ambulatory Visit (HOSPITAL_BASED_OUTPATIENT_CLINIC_OR_DEPARTMENT_OTHER)
Admission: RE | Admit: 2015-05-11 | Discharge: 2015-05-11 | Disposition: A | Discharge status: Home / Self Care | Payer: Medicare Other | Source: Ambulatory Visit | Attending: Cardiology | Admitting: Cardiology

## 2015-05-11 VITALS — BP 110/0

## 2015-05-11 DIAGNOSIS — I10 Essential (primary) hypertension: Secondary | ICD-10-CM | POA: Diagnosis not present

## 2015-05-11 DIAGNOSIS — I428 Other cardiomyopathies: Secondary | ICD-10-CM | POA: Diagnosis not present

## 2015-05-11 DIAGNOSIS — M8580 Other specified disorders of bone density and structure, unspecified site: Secondary | ICD-10-CM | POA: Diagnosis not present

## 2015-05-11 DIAGNOSIS — Z7901 Long term (current) use of anticoagulants: Secondary | ICD-10-CM | POA: Insufficient documentation

## 2015-05-11 DIAGNOSIS — Z95811 Presence of heart assist device: Secondary | ICD-10-CM

## 2015-05-11 DIAGNOSIS — I480 Paroxysmal atrial fibrillation: Secondary | ICD-10-CM | POA: Insufficient documentation

## 2015-05-11 DIAGNOSIS — Z87891 Personal history of nicotine dependence: Secondary | ICD-10-CM | POA: Insufficient documentation

## 2015-05-11 DIAGNOSIS — I5022 Chronic systolic (congestive) heart failure: Secondary | ICD-10-CM

## 2015-05-11 DIAGNOSIS — R0600 Dyspnea, unspecified: Secondary | ICD-10-CM

## 2015-05-11 DIAGNOSIS — M25519 Pain in unspecified shoulder: Secondary | ICD-10-CM

## 2015-05-11 DIAGNOSIS — R609 Edema, unspecified: Secondary | ICD-10-CM

## 2015-05-11 DIAGNOSIS — Z8674 Personal history of sudden cardiac arrest: Secondary | ICD-10-CM | POA: Insufficient documentation

## 2015-05-11 DIAGNOSIS — Z5181 Encounter for therapeutic drug level monitoring: Secondary | ICD-10-CM

## 2015-05-11 DIAGNOSIS — R0609 Other forms of dyspnea: Secondary | ICD-10-CM

## 2015-05-11 DIAGNOSIS — Z79899 Other long term (current) drug therapy: Secondary | ICD-10-CM | POA: Insufficient documentation

## 2015-05-11 DIAGNOSIS — T372X5D Adverse effect of antimalarials and drugs acting on other blood protozoa, subsequent encounter: Secondary | ICD-10-CM

## 2015-05-11 DIAGNOSIS — R600 Localized edema: Secondary | ICD-10-CM

## 2015-05-11 LAB — LACTATE DEHYDROGENASE: LDH: 257 U/L — ABNORMAL HIGH (ref 98–192)

## 2015-05-11 LAB — COMPREHENSIVE METABOLIC PANEL
ALT: 18 U/L (ref 14–54)
AST: 24 U/L (ref 15–41)
Albumin: 3.6 g/dL (ref 3.5–5.0)
Alkaline Phosphatase: 91 U/L (ref 38–126)
Anion gap: 7 (ref 5–15)
BUN: 16 mg/dL (ref 6–20)
CO2: 21 mmol/L — ABNORMAL LOW (ref 22–32)
Calcium: 9.7 mg/dL (ref 8.9–10.3)
Chloride: 109 mmol/L (ref 101–111)
Creatinine, Ser: 1.54 mg/dL — ABNORMAL HIGH (ref 0.44–1.00)
GFR calc Af Amer: 36 mL/min — ABNORMAL LOW (ref >60)
GFR calc non Af Amer: 31 mL/min — ABNORMAL LOW (ref >60)
Glucose, Bld: 91 mg/dL (ref 65–99)
Potassium: 4 mmol/L (ref 3.5–5.1)
Sodium: 137 mmol/L (ref 135–145)
Total Bilirubin: 0.6 mg/dL (ref 0.3–1.2)
Total Protein: 8.7 g/dL — ABNORMAL HIGH (ref 6.5–8.1)

## 2015-05-11 LAB — PROTIME-INR
INR: 2.04 — ABNORMAL HIGH (ref 0.00–1.49)
Prothrombin Time: 22.9 seconds — ABNORMAL HIGH (ref 11.6–15.2)

## 2015-05-11 LAB — CBC
HCT: 29.6 % — ABNORMAL LOW (ref 36.0–46.0)
Hemoglobin: 9 g/dL — ABNORMAL LOW (ref 12.0–15.0)
MCH: 23.6 pg — ABNORMAL LOW (ref 26.0–34.0)
MCHC: 30.4 g/dL (ref 30.0–36.0)
MCV: 77.5 fL — ABNORMAL LOW (ref 78.0–100.0)
Platelets: 257 10*3/uL (ref 150–400)
RBC: 3.82 MIL/uL — ABNORMAL LOW (ref 3.87–5.11)
RDW: 20.6 % — ABNORMAL HIGH (ref 11.5–15.5)
WBC: 7.2 10*3/uL (ref 4.0–10.5)

## 2015-05-11 LAB — T4, FREE: Free T4: 1.29 ng/dL — ABNORMAL HIGH (ref 0.61–1.12)

## 2015-05-11 LAB — MAGNESIUM: Magnesium: 2 mg/dL (ref 1.7–2.4)

## 2015-05-11 LAB — TSH: TSH: 2.706 u[IU]/mL (ref 0.350–4.500)

## 2015-05-11 LAB — BRAIN NATRIURETIC PEPTIDE: B Natriuretic Peptide: 214.5 pg/mL — ABNORMAL HIGH (ref 0.0–100.0)

## 2015-05-11 MED ORDER — TRAMADOL HCL 50 MG PO TABS: 50.0000 mg | ORAL_TABLET | Freq: Four times a day (QID) | ORAL | Status: DC | PRN

## 2015-05-11 MED ORDER — TORSEMIDE 20 MG PO TABS: 20.0000 mg | ORAL_TABLET | Freq: Every day | ORAL | Status: DC | PRN

## 2015-05-11 NOTE — Progress Notes (Signed)
*  PRELIMINARY RESULTS* Echocardiogram 2D Echocardiogram has been performed.  Leavy Cella 05/11/2015, 10:00 AM

## 2015-05-11 NOTE — Progress Notes (Signed)
ADVANCED HF CLINIC NOTE  Patient ID: Brianna Hanson, female   DOB: 10-29-1934, 80 y.o.   MRN: [id number] Primary Heart Failure: Dr Haroldine Laws Cardiac Surgeon: Dr Darcey Nora  HPI: Ms Heldt is a 80 year old female with a PMH of HF due to severe NICM, chronic systolic HF,  PAF,  plasma cell disorder (Likely IgA MGUS) - followed by Dr. [name] (follwoed q 6 months). Underwent implantation of the HeartMate II LVAD on 04/06/13 for DT. She is not on aspirin due to dizziness.    She returns routine LVAD follow up. Overall feeling very well. "I am having a great time." Denies SOB/PND/Orthopnea. Mild LE edema. Weight stable.  No bleeding problems. Significant other performs weekly dressing changes. Walks one mile a couple times a week.   Denies LVAD alarms.  Denies driveline trauma, erythema or drainage.  Denies ICD shocks.   Reports taking Coumadin as prescribed.  Denies bright red blood per rectum or melena, no dark urine or hematuria.    LVAD interrogation & Equipment Maintenance:  Speed: 9200  Flow: 5.0  Power: 5.5  PI: 7.1  Alarms: none  Events: 0 - 3 every few days (1 - 3 days /week)  Fixed speed: 9200  Low speed limit: 8600  Primary Controller: Replace back up battery in 12 months (Jan/2017)  Back up controller: Replace back up battery in 12 months (Jan/2017)  Echo (7/15) with LV EF 30%.    Past Medical History  Diagnosis Date  . Cardiac arrest - ventricular fibrillation 12/10    with successful resucitation, S/p ICD  . ICD (implantable cardiac defibrillator) in place     she has received appropriate therapy for VF  . Atrial fibrillation or flutter   . Osteopenia   . HTN (hypertension)     moderate  . Nonischemic cardiomyopathy     followed by Dr April Holding at [facility]  . CHF (congestive heart failure)   . Anemia   . S/P colonoscopy   . Plasma cell disorder 03/20/2012  . Plasma cell disorder 03/20/2012    Current Outpatient Prescriptions  Medication Sig Dispense Refill  .  amLODipine (NORVASC) 10 MG tablet TAKE 1 TABLET BY MOUTH TWICE A DAY 60 tablet 6  . carvedilol (COREG) 12.5 MG tablet Take 1.5 tabs twice daily (Patient taking differently: Take 1.5 tablets by mouth twice daily) 90 tablet 6  . Cholecalciferol (VITAMIN D3) 1000 UNITS CAPS Take 1 tablet by mouth daily.      [name] gabapentin (NEURONTIN) 100 MG capsule TAKE 1 CAPSULE BY MOUTH AT BEDTIME OR AS DIRECTED 30 capsule 6  . hydrALAZINE (APRESOLINE) 100 MG tablet TAKE 1 TABLET BY MOUTH 3 TIMES A DAY 90 tablet 6  . losartan (COZAAR) 50 MG tablet TAKE 1 TABLET BY MOUTH EVERY DAY 30 tablet 6  . pantoprazole (PROTONIX) 40 MG tablet TAKE 1 TABLET BY MOUTH EVERY DAY 30 tablet 6  . spironolactone (ALDACTONE) 25 MG tablet TAKE 1 TABLET BY MOUTH EVERY DAY 30 tablet 6  . torsemide (DEMADEX) 20 MG tablet Take 1 tablet (20 mg total) by mouth daily as needed. weight gain 3 lbs in 24 hours 15 tablet 6  . traMADol (ULTRAM) 50 MG tablet Take 1 tablet (50 mg total) by mouth every 6 (six) hours as needed for moderate pain. for pain 30 tablet 3  . warfarin (COUMADIN) 2.5 MG tablet Take one tab daily or as directed for INR 2.0 - 2.5 45 tablet 6  . amiodarone (PACERONE) 200 MG  tablet Take 0.5 tablets (100 mg total) by mouth daily. 15 tablet 5  . enoxaparin (LOVENOX) 80 MG/0.8ML injection Inject 0.7 mLs (70 mg total) into the skin every 12 (twelve) hours. Squirt out 0.1 mL and only inject 0.7 mL not the whole syringe (Patient not taking: Reported on 05/11/2015) 8 Syringe 1   No current facility-administered medications for this encounter.    Review of patient's allergies indicates no known allergies.  REVIEW OF SYSTEMS: All systems negative except as listed in HPI, PMH and Problem list.  Filed Vitals:   05/11/15 1111  BP: 110/0   Vital Signs:  MAP: 110  Auto BP: 106/74 (86)  HR: 65  SPO2: 96%  Weight: 179.8 lbs  Last weight: 182.6 Lbs   Physical Exam: GENERAL: Well appearing, female, NAD, [name] present HEENT: normal    NECK: Supple, JVP 5-6,  2+ bilaterally, no bruits.  No lymphadenopathy or thyromegaly appreciated.   CARDIAC:  LVAD hum present.  LUNGS:  Clear to auscultation bilaterally.  ABDOMEN:  Soft, round, nontender, positive bowel sounds x4.     LVAD exit site: Dressing dry and intact.  No erythema or drainage.  Stabilization device present and accurately applied. Driveline dressing changed weekly. EXTREMITIES:  Warm and dry, no cyanosis, clubbing, rash, edema  NEUROLOGIC:  Alert and oriented x 4.  Gait steady.  No aphasia.  No dysarthria.  Affect pleasant.     ASSESSMENT AND PLAN:  1) Chronic systolic HF: NICM, s/p ICD and LVAD for DT(04/2013).  Doing great.  NYHA I symptoms. Volume status stable.  Continue torsemide as needed.   -Continue 25 mg spironolactone daily. -Continue carvedilol 18.75 mg twice a day -Continue losartan 50 mg daily  -Continue hydralazine 100 mg three times a day.  - Continue weekly dressing changes.  - Reinforced the need and importance of daily weights, a low sodium diet, and fluid restriction (less than 2 L a day). Instructed to call the HF clinic if weight increases more than 3 lbs overnight or 5 lbs in a week.  2) LVAD placed for DT 04/2013: All parameters stable. (reviewed personally) - Check LDH, INR, CBC and BMET today.  Remains on coumadin.   Not on aspirin due to intolerance. 3) PAF: Stable. Continue amiodarone to 100 mg daily. Follow TSH and LFTsNeeds year eye exams.    4) HTN: Stable. Continue current regimen.    5) Chronic Anticoagulation: Goal INR 2-3 Continue coumadin. Check INR   Follow up in 2 months.   Glori Bickers  MD  05/11/2015

## 2015-05-11 NOTE — Telephone Encounter (Signed)
Lab results called to Brianna Hanson. All questions answered. See anticoag note for details regarding INR.

## 2015-05-11 NOTE — Progress Notes (Signed)
Symptom  Yes  No  Details   Angina        x Activity:   Claudication        x How far:   Syncope        x When:   Stroke        x   Orthopnea        x How many pillows:  2  PND        x How often:  CPAP      N/A How many hrs:   Pedal edema        x       occasionally; clears overnight  Abd fullness        x   N&V        x   Diaphoresis        x          When:  Occasional night sweat   Bleeding       x   Urine color   Light yellow  SOB        x Activity:   Palpitations        x When:  ICD shock        x   Hospitlizaitons        x When/where/why:  ED visit        x When/where/why:  Other MD              x   When/who/why:  Activity    Walk 1 mile 2x week, maintaining activity currently   Fluid    No limitaions  Diet    No added salt; Appetite improved since last visit.    Vital Signs: MAP:  110 Auto BP: 106/74 (86) HR: 65 SPO2: 96%  Weight:  179.8 lbs Last weight:  182.6  Lbs Home Weights: 172 - 174 lbs  LVAD interrogation & Equipment Maintenance:  Speed:  9200 Flow: 5.0 Power: 5.5 PI: 7.1 Alarms: none  Events: 0 - 3 every few days (1 - 3 days /week) Fixed speed:  9200 Low speed limit:  8600 Primary Controller:  Replace back up battery in 12 months (Jan/2017) Back up controller:   Replace back up battery in 12 months (Jan/2017)  I reviewed the LVAD parameters from today, and compared the results to the patient's prior recorded data. No programming changes were made. The LVAD is functioning within specified parameters. LVAD interrogation was negative for any significant power changes, alarms or PI events/speed drops.  LVAD equipment check completed and is in good working order. Back-up equipment present. LVAD education done on emergency procedures and precautions and reviewed exit site care.    Implant Date: 04/06/2013 Implanting Surgeon: Cyndia Bent Indication for Therapy: DT  LVAD exit site: Well healed and incorporated. The velour is fully implanted at exit site. Dressing  dry and intact. No erythema or drainage. Stabilization device present and accurately applied. Driveline dressing is being changed weekly per sterile technique using Sorbaview dressing with biopatch on exit site. Pt denies fever or chills. Pt/caregiver provided adequate dressing supplies for home.  Encounter Details:  Presents today for her 2 month follow up visit with her caregiver and husband [name]. Feels well on support and has been very stable since last visit. Following up with PCP, Nephrology and Heme/Onc for other chronic conditions; nothing to report from her visits with them. She is compliant with medications and has no complaints today aside from some swelling in bilateral legs  R>L. Refilled Torsemide today and Ultram (Sent to [facility]). MAP controlled on current medication regimen. Labs are pending and will call results to her once available. TSH/LFT's for amiodarone surveillance completed today. Reports going for yearly eye exams.   Reviewed and demonstrated the following to patient and caregiver:               Reviewed the steps for replacing the running system controller with the back-up system controller (see patient handbook section 2 or the appropriate pamphlet).             X  Demonstrated (using the mock-driveline and controller) how to connect and disconnect the mock-driveline in back up system controller in a timely manner (less than 10 seconds) with return demonstration by patient and caregiver.              X   Reviewed system controller alarms and troubleshooting including hazard and advisory alarms and accessing alarm history. Re-enforced NOT TO attempt to perform any task displayed on the display screen alone or without calling the VAD pager [phone] (see patient handbook section 5 or the appropriate pamphlet).                       X  Reviewed how to handle an emergency including when the pump is running and when the pump has stopped (see patient handbook section 8).  Call 911 first and then the VAD pager at [phone].             X   Reviewed 14-volt lithium ion battery calibration steps (see patient handbook section 3).            X   Reviewed contents of black bag and what must be with patient at all times.            X  Reviewed driveline exit site including cleansing, dressing, and immobilizing with an anchor device to prevent exit site trauma.             X  Reviewed weekly maintenance which includes: Reviewing "Replacing the [place] with a [employer]" pamphlet. Clean batteries, clips, and battery charger contacts.  Check cables for damages. Rotate batteries; keep all 8 charged.              X  Reviewed monthly maintenance which includes: Reviewing Alarms and Troubleshooting. Check battery manufacturer dates. Check use/charge cycles for each battery; remember to re-calibrate every 70 uses when prompted.              X  Additional pamphlets Guide to Replacing the [place] with the [place] for Patients and Their Caregivers and HM II Alarms for Patients and Their Caregivers given to patient.              X   Batteries Manufacture Date: Number of uses: Re-calibration  12/2012 166, 168, 160, 70 Being maintained correctly at home and re-calibrated appropriately.    Annual maintenance completed per [employer] on patient's home power module and Electrical engineer.    SLA Battery replaced with [id number] Exp. 01-30-18  Patient Cable: [id number]  Backup system controller will need to be charged at home (called and left instructions with [name]).    2 year Intermacs follow up completed including: Quality of Life & KCCQ-12. Neurocognitive trail making was not administered d/t patient request.   Pt completed 680 feet during 6 minute walk.   Janene Madeira, RN VAD Coordinator  Office: [phone] 24/[address] [place] Pager: [phone]

## 2015-05-11 NOTE — Patient Instructions (Signed)
1. No medication changes today. You look great!  2. Walked 680 feet during your 6 minute walk test.   3. Return to see [place] in 2 months.   4. We will call you with you lab results.

## 2015-05-13 ENCOUNTER — Other Ambulatory Visit (HOSPITAL_COMMUNITY): Payer: Self-pay | Admitting: Internal Medicine

## 2015-05-13 ENCOUNTER — Other Ambulatory Visit (HOSPITAL_COMMUNITY): Payer: Self-pay | Admitting: Adult Health

## 2015-05-15 ENCOUNTER — Other Ambulatory Visit (HOSPITAL_COMMUNITY): Payer: Self-pay | Admitting: Internal Medicine

## 2015-05-16 ENCOUNTER — Encounter: Payer: Self-pay | Admitting: Internal Medicine

## 2015-05-26 ENCOUNTER — Other Ambulatory Visit: Payer: Medicare Other

## 2015-05-29 ENCOUNTER — Telehealth (HOSPITAL_COMMUNITY): Payer: Self-pay | Admitting: Infectious Diseases

## 2015-05-29 NOTE — Telephone Encounter (Signed)
Called re: missed INR appointment from Friday 9/23. Rescheduled her appointment for Wednesday 9/28.

## 2015-05-31 ENCOUNTER — Other Ambulatory Visit (INDEPENDENT_AMBULATORY_CARE_PROVIDER_SITE_OTHER): Payer: Medicare Other | Admitting: *Deleted

## 2015-05-31 ENCOUNTER — Ambulatory Visit (HOSPITAL_COMMUNITY): Payer: Self-pay | Admitting: *Deleted

## 2015-05-31 DIAGNOSIS — Z7901 Long term (current) use of anticoagulants: Secondary | ICD-10-CM

## 2015-05-31 DIAGNOSIS — Z5181 Encounter for therapeutic drug level monitoring: Secondary | ICD-10-CM | POA: Diagnosis not present

## 2015-05-31 DIAGNOSIS — Z95811 Presence of heart assist device: Secondary | ICD-10-CM

## 2015-05-31 LAB — PROTIME-INR
INR: 1.98 — ABNORMAL HIGH (ref <1.50)
Prothrombin Time: 22.8 seconds — ABNORMAL HIGH (ref 11.6–15.2)

## 2015-05-31 NOTE — Addendum Note (Signed)
Addended by: Domenica Reamer R on: 05/31/2015 11:47 AM   Modules accepted: Orders

## 2015-06-14 ENCOUNTER — Ambulatory Visit (HOSPITAL_COMMUNITY): Payer: Self-pay | Admitting: *Deleted

## 2015-06-14 ENCOUNTER — Other Ambulatory Visit (INDEPENDENT_AMBULATORY_CARE_PROVIDER_SITE_OTHER): Payer: Medicare Other

## 2015-06-14 DIAGNOSIS — Z7901 Long term (current) use of anticoagulants: Secondary | ICD-10-CM

## 2015-06-14 DIAGNOSIS — I10 Essential (primary) hypertension: Secondary | ICD-10-CM

## 2015-06-14 DIAGNOSIS — Z95811 Presence of heart assist device: Secondary | ICD-10-CM

## 2015-06-14 LAB — PROTIME-INR
INR: 2.19 — ABNORMAL HIGH (ref <1.50)
Prothrombin Time: 24.7 seconds — ABNORMAL HIGH (ref 11.6–15.2)

## 2015-06-28 ENCOUNTER — Other Ambulatory Visit (INDEPENDENT_AMBULATORY_CARE_PROVIDER_SITE_OTHER): Payer: Medicare Other | Admitting: *Deleted

## 2015-06-28 DIAGNOSIS — I480 Paroxysmal atrial fibrillation: Secondary | ICD-10-CM | POA: Diagnosis not present

## 2015-06-28 DIAGNOSIS — I4901 Ventricular fibrillation: Secondary | ICD-10-CM | POA: Diagnosis not present

## 2015-06-28 DIAGNOSIS — I10 Essential (primary) hypertension: Secondary | ICD-10-CM

## 2015-06-28 DIAGNOSIS — I509 Heart failure, unspecified: Secondary | ICD-10-CM | POA: Diagnosis not present

## 2015-06-28 DIAGNOSIS — I4891 Unspecified atrial fibrillation: Secondary | ICD-10-CM | POA: Diagnosis not present

## 2015-06-28 DIAGNOSIS — I5023 Acute on chronic systolic (congestive) heart failure: Secondary | ICD-10-CM | POA: Diagnosis not present

## 2015-06-28 DIAGNOSIS — I50810 Right heart failure, unspecified: Secondary | ICD-10-CM

## 2015-06-28 LAB — PROTIME-INR
INR: 2.27 — ABNORMAL HIGH (ref <1.50)
Prothrombin Time: 25.4 seconds — ABNORMAL HIGH (ref 11.6–15.2)

## 2015-06-28 NOTE — Addendum Note (Signed)
Addended by: Eulis Foster on: 06/28/2015 11:40 AM   Modules accepted: Orders

## 2015-06-29 ENCOUNTER — Ambulatory Visit (HOSPITAL_COMMUNITY): Payer: Self-pay | Admitting: Infectious Diseases

## 2015-07-11 ENCOUNTER — Encounter: Payer: Self-pay | Admitting: Internal Medicine

## 2015-07-11 ENCOUNTER — Other Ambulatory Visit (HOSPITAL_COMMUNITY): Payer: Self-pay | Admitting: *Deleted

## 2015-07-11 ENCOUNTER — Ambulatory Visit (HOSPITAL_COMMUNITY)
Admission: RE | Admit: 2015-07-11 | Discharge: 2015-07-11 | Disposition: A | Discharge status: Home / Self Care | Payer: Medicare Other | Source: Ambulatory Visit | Attending: Internal Medicine | Admitting: Internal Medicine

## 2015-07-11 ENCOUNTER — Encounter: Payer: Self-pay | Admitting: Licensed Clinical Social Worker

## 2015-07-11 ENCOUNTER — Encounter (HOSPITAL_COMMUNITY): Payer: Self-pay | Admitting: *Deleted

## 2015-07-11 ENCOUNTER — Ambulatory Visit (HOSPITAL_COMMUNITY): Payer: Self-pay | Admitting: *Deleted

## 2015-07-11 VITALS — BP 70/0 | HR 64 | Ht 60.0 in | Wt 180.6 lb

## 2015-07-11 DIAGNOSIS — Z95811 Presence of heart assist device: Secondary | ICD-10-CM | POA: Diagnosis not present

## 2015-07-11 DIAGNOSIS — I429 Cardiomyopathy, unspecified: Secondary | ICD-10-CM | POA: Diagnosis not present

## 2015-07-11 DIAGNOSIS — Z7901 Long term (current) use of anticoagulants: Secondary | ICD-10-CM

## 2015-07-11 DIAGNOSIS — I5022 Chronic systolic (congestive) heart failure: Secondary | ICD-10-CM | POA: Diagnosis not present

## 2015-07-11 DIAGNOSIS — K649 Unspecified hemorrhoids: Secondary | ICD-10-CM | POA: Diagnosis not present

## 2015-07-11 DIAGNOSIS — I10 Essential (primary) hypertension: Secondary | ICD-10-CM | POA: Diagnosis not present

## 2015-07-11 DIAGNOSIS — Z8674 Personal history of sudden cardiac arrest: Secondary | ICD-10-CM | POA: Insufficient documentation

## 2015-07-11 DIAGNOSIS — Z95810 Presence of automatic (implantable) cardiac defibrillator: Secondary | ICD-10-CM | POA: Insufficient documentation

## 2015-07-11 DIAGNOSIS — M8580 Other specified disorders of bone density and structure, unspecified site: Secondary | ICD-10-CM | POA: Diagnosis not present

## 2015-07-11 DIAGNOSIS — D509 Iron deficiency anemia, unspecified: Secondary | ICD-10-CM

## 2015-07-11 DIAGNOSIS — R0600 Dyspnea, unspecified: Secondary | ICD-10-CM

## 2015-07-11 DIAGNOSIS — I480 Paroxysmal atrial fibrillation: Secondary | ICD-10-CM | POA: Insufficient documentation

## 2015-07-11 DIAGNOSIS — K648 Other hemorrhoids: Secondary | ICD-10-CM

## 2015-07-11 DIAGNOSIS — D500 Iron deficiency anemia secondary to blood loss (chronic): Secondary | ICD-10-CM

## 2015-07-11 LAB — BASIC METABOLIC PANEL
Anion gap: 10 (ref 5–15)
BUN: 14 mg/dL (ref 6–20)
CO2: 22 mmol/L (ref 22–32)
Calcium: 9 mg/dL (ref 8.9–10.3)
Chloride: 108 mmol/L (ref 101–111)
Creatinine, Ser: 1.47 mg/dL — ABNORMAL HIGH (ref 0.44–1.00)
GFR calc Af Amer: 38 mL/min — ABNORMAL LOW (ref >60)
GFR calc non Af Amer: 33 mL/min — ABNORMAL LOW (ref >60)
Glucose, Bld: 94 mg/dL (ref 65–99)
Potassium: 3.7 mmol/L (ref 3.5–5.1)
Sodium: 140 mmol/L (ref 135–145)

## 2015-07-11 LAB — CBC
HCT: 26.3 % — ABNORMAL LOW (ref 36.0–46.0)
Hemoglobin: 7.6 g/dL — ABNORMAL LOW (ref 12.0–15.0)
MCH: 21.4 pg — ABNORMAL LOW (ref 26.0–34.0)
MCHC: 28.9 g/dL — ABNORMAL LOW (ref 30.0–36.0)
MCV: 74.1 fL — ABNORMAL LOW (ref 78.0–100.0)
Platelets: 252 10*3/uL (ref 150–400)
RBC: 3.55 MIL/uL — ABNORMAL LOW (ref 3.87–5.11)
RDW: 23.4 % — ABNORMAL HIGH (ref 11.5–15.5)
WBC: 6.1 10*3/uL (ref 4.0–10.5)

## 2015-07-11 LAB — PROTIME-INR
INR: 2.02 — ABNORMAL HIGH (ref 0.00–1.49)
Prothrombin Time: 22.7 seconds — ABNORMAL HIGH (ref 11.6–15.2)

## 2015-07-11 LAB — LACTATE DEHYDROGENASE: LDH: 227 U/L — ABNORMAL HIGH (ref 98–192)

## 2015-07-11 LAB — BRAIN NATRIURETIC PEPTIDE: B Natriuretic Peptide: 276.6 pg/mL — ABNORMAL HIGH (ref 0.0–100.0)

## 2015-07-11 MED ORDER — HYDROCORTISONE ACETATE 25 MG RE SUPP: 25.0000 mg | Freq: Two times a day (BID) | RECTAL | Status: DC

## 2015-07-11 MED ORDER — FERROUS SULFATE 325 (65 FE) MG PO TABS: 325.0000 mg | ORAL_TABLET | Freq: Two times a day (BID) | ORAL | Status: AC

## 2015-07-11 MED ORDER — DOCUSATE SODIUM 100 MG PO CAPS: 100.0000 mg | ORAL_CAPSULE | Freq: Two times a day (BID) | ORAL | Status: DC

## 2015-07-11 NOTE — Progress Notes (Signed)
Symptom  Yes  No  Details   Angina        x Activity:   Claudication        x How far:   Syncope        x When:   Stroke        x   Orthopnea        x How many pillows:  2  PND        x How often:  CPAP      N/A How many hrs:   Pedal edema        x       occasionally; clears overnight  Abd fullness        x   N&V        x   Diaphoresis        x          When:  Occasional night sweat   Bleeding       x  Bright red bleeding from external hemorrhoids over last 2 weeks  Urine color   Light yellow  SOB        x Activity:   Palpitations        x When:  ICD shock        x   Hospitlizaitons        x When/where/why:  ED visit        x When/where/why:  Other MD              x   When/who/why:  Activity    Walk 1 mile 2x week, maintaining activity currently   Fluid    No limitaions  Diet    No added salt; Appetite improved since last visit.    Vital Signs: MAP:  70; repeat 88 Auto BP: 82/55 (67); repeat 95/59 (71) HR: 64 SPO2: 99 %  Weight:  180.6 lbs Last weight:  179.8 Lbs   LVAD interrogation & Equipment Maintenance:  Speed:  9200 Flow: 4.8 Power: 5.3 PI: 7.5 Alarms: none  Events: rare PI Fixed speed:  9200 Low speed limit:  8600 Primary Controller:  Replace back up battery in 8 months (Jan/2017)  Back up controller:   Replace back up battery in 8 months (Jan/2017)  I reviewed the LVAD parameters from today, and compared the results to the patient's prior recorded data. No programming changes were made. The LVAD is functioning within specified parameters. LVAD interrogation was negative for any significant power changes, alarms or PI events/speed drops.  LVAD equipment check completed and is in good working order. Back-up equipment present. LVAD education done on emergency procedures and precautions and reviewed exit site care.    LVAD exit site: Well healed and incorporated. The velour is fully implanted at exit site. Dressing dry and intact. No erythema or drainage.  Stabilization device present and accurately applied. Driveline dressing is being changed weekly per sterile technique using Sorbaview dressing with biopatch on exit site. Pt denies fever or chills. Pt/caregiver provided adequate dressing supplies for home.  Hgb down to 7.6 (from 9.0) with complaints of increased fatigue. Husband says if she doesn't get her rest, she gets "more tired".  Per [name], will contact [facility] for first available with Dr. Carlean Purl for evaluation of external. hemorrhoids.   Patient Instructions: 1. Start Ferrous Sulfate 325 mg (Iron pills) twice daily. 2.  Start Colace 100 mg (stool softener) twice daily 3.  Use Hydrocortisone suppositories twice daily for 6  days. 4.  Will call you with GI appt. 5.  No change in coumadin dose; re-check INR 07/25/15. 6.  Return to [facility] in two months.   Zada Girt, RN VAD Coordinator  Office: [phone] 24/7 VAD Pager: [phone]

## 2015-07-11 NOTE — Patient Instructions (Addendum)
1. Start Ferrous Sulfate 325 mg (Iron pills) twice daily. 2.  Start Colace 100 mg (stool softener) twice daily 3.  Use Hydrocortisone suppositories twice daily for 6 days. 4.  Will call you with GI appt. 5.  No change in coumadin dose; re-check INR 07/25/15. 6.  Return to [place] [facility] in two months.

## 2015-07-11 NOTE — Progress Notes (Signed)
Patient ID: Brianna Hanson, female   DOB: Aug 01, 1935, 80 y.o.   MRN: [id number]   ADVANCED HF CLINIC NOTE  Patient ID: Brianna Hanson, female   DOB: 09/02/1935, 80 y.o.   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 [name] (follwoed q 6 months). Underwent implantation of the HeartMate II LVAD on 04/06/13 for DT. She is not on aspirin due to dizziness.    She returns routine LVAD follow up. Overall feeling very well. " Denies SOB/PND/Orthopnea. No edema. Weight stable.  Has been having trouble with bleeding hemorrhoids x 2 weeks. Significant other performs weekly dressing changes. Walks one mile a couple times a week without problem.   Denies LVAD alarms.  Denies driveline trauma, erythema or drainage.  Denies ICD shocks.   Reports taking Coumadin as prescribed.  +bright red blood per rectum. no melena, no dark urine or hematuria.     LVAD interrogation & Equipment Maintenance:  Speed: 9200 Flow: 4.8 Power: 5.3 PI: 7.5 Alarms: none  Events: rare PI Fixed speed: 9200 Low speed limit: 8600 Primary Controller: Replace back up battery in 8 months (Jan/2017)  Back up controller: Replace back up battery in 8 months (Jan/2017)  Echo (9/16) with LV EF 30-35%.    Past Medical History  Diagnosis Date  . Cardiac arrest - ventricular fibrillation 12/10    with successful resucitation, S/p ICD  . ICD (implantable cardiac defibrillator) in place     she has received appropriate therapy for VF  . Atrial fibrillation or flutter   . Osteopenia   . HTN (hypertension)     moderate  . Nonischemic cardiomyopathy ([place])     followed by Dr April Holding at [facility]  . CHF (congestive heart failure)   . Anemia   . S/P colonoscopy   . Plasma cell disorder 03/20/2012  . Plasma cell disorder 03/20/2012     Current Outpatient Prescriptions  Medication Sig Dispense Refill  . amiodarone (PACERONE) 200 MG tablet Take 0.5 tablets (100 mg total) by mouth daily. 15 tablet 5  . amLODipine (NORVASC) 10 MG tablet TAKE 1 TABLET BY MOUTH TWICE A DAY 60 tablet 6  . carvedilol (COREG) 12.5 MG tablet TAKE 1 & 1/2 TABS TWICE DAILY 90 tablet 6  . Cholecalciferol (VITAMIN D3) 1000 UNITS CAPS Take 1 tablet by mouth daily.      [name] gabapentin (NEURONTIN) 100 MG capsule TAKE 1 CAPSULE BY MOUTH AT BEDTIME OR AS DIRECTED 30 capsule 6  . hydrALAZINE (APRESOLINE) 100 MG tablet TAKE 1 TABLET BY MOUTH 3 TIMES A DAY 90 tablet 6  . losartan (COZAAR) 50 MG tablet TAKE 1 TABLET BY MOUTH EVERY DAY 30 tablet 6  . pantoprazole (PROTONIX) 40 MG tablet TAKE 1 TABLET BY MOUTH EVERY DAY 30 tablet 6  . spironolactone (ALDACTONE) 25 MG tablet TAKE 1 TABLET BY MOUTH EVERY DAY 30 tablet 6  . warfarin (COUMADIN) 2.5 MG tablet TAKE ONE TAB DAILY OR AS DIRECTED FOR INR 2.0 - 2.5 45 tablet 6  . docusate sodium (COLACE) 100 MG capsule Take 1 capsule (100 mg total) by mouth 2 (two) times daily. 60 capsule 6  . ferrous sulfate 325 (65 FE) MG tablet Take 1 tablet (325 mg total) by mouth 2 (two) times daily with a meal.  60 tablet 6  . hydrocortisone (ANUSOL-HC) 25 MG suppository Place 1 suppository (25 mg total) rectally 2 (two) times daily. 12 suppository 0  . torsemide (DEMADEX) 20 MG tablet Take 1 tablet (20 mg total) by mouth daily as needed. weight gain 3 lbs in 24 hours (Patient not taking: Reported on 07/11/2015) 15 tablet 6  . traMADol (ULTRAM) 50 MG tablet Take 1 tablet (50 mg total) by mouth every 6 (six) hours as needed for moderate pain. for pain (Patient not taking: Reported on 07/11/2015) 30 tablet 3   No current facility-administered medications for this encounter.    Review of patient's allergies indicates no known allergies.  REVIEW OF SYSTEMS: All systems negative except as listed in HPI, PMH and Problem list.  Filed  Vitals:   07/11/15 1014 07/11/15 1015  BP: 82/55 70/0  Pulse: 64   Height: 5' (1.524 m)   Weight: 180 lb 9.6 oz (81.92 kg)   SpO2: 99%    Vital Signs: MAP: 70; repeat 88 Auto BP: 82/55 (67); repeat 95/59 (71) HR: 64 SPO2: 99 %  Weight: 180.6 lbs Last weight: 179.8 Lbs Home Weights:   Physical Exam: GENERAL: Well appearing, female, NAD, [name] present HEENT: normal  NECK: Supple, JVP 5-6,  2+ bilaterally, no bruits.  No lymphadenopathy or thyromegaly appreciated.   CARDIAC:  LVAD hum present.  LUNGS:  Clear to auscultation bilaterally.  ABDOMEN:  Soft, round, nontender, positive bowel sounds x4.     LVAD exit site: Dressing dry and intact.  No erythema or drainage.  Stabilization device present and accurately applied. Driveline dressing changed weekly. EXTREMITIES:  Warm and dry, no cyanosis, clubbing, rash, edema  NEUROLOGIC:  Alert and oriented x 4.  Gait steady.  No aphasia.  No dysarthria.  Affect pleasant.     ASSESSMENT AND PLAN:  1) Chronic systolic HF: NICM, s/p ICD and LVAD for DT(04/2013).  Doing great.  NYHA I symptoms. Volume status stable.  Continue torsemide as needed.   -Continue 25 mg spironolactone daily. -Continue carvedilol 18.75 mg twice a day -Continue losartan 50 mg daily  -Continue hydralazine 100 mg three times a day.  - Continue weekly dressing changes.  - Reinforced the need and importance of daily weights, a low sodium diet, and fluid restriction (less than 2 L a day). Instructed to call the HF clinic if weight increases more than 3 lbs overnight or 5 lbs in a week.  2) LVAD placed for DT 04/2013: All parameters stable. (reviewed personally) - Check LDH, INR, CBC and BMET today.  Remains on coumadin.   Not on aspirin due to intolerance. 3) PAF: Stable. Continue amiodarone to 100 mg daily. Follow TSH and LFTs. Needs year eye exams.    4) HTN: Stable. Continue current regimen.    5) Hemorrhoidal bleeding with iron deficiency anemia: -Hgb now down  2g/dl. With iron deficiency. Will start iron and colace. Watch for constipation.  -I discussed with GI. Will also have her do sitz baths and hydrocortisone suppositories x 5 days. If still bleeding will need GI referral.  5) Chronic Anticoagulation: Goal INR 2-3 Continue coumadin. Check INR   Follow up in 2 months.   Glori Bickers  MD  07/11/2015

## 2015-07-21 ENCOUNTER — Other Ambulatory Visit (HOSPITAL_COMMUNITY): Payer: Self-pay | Admitting: Internal Medicine

## 2015-07-24 ENCOUNTER — Ambulatory Visit (INDEPENDENT_AMBULATORY_CARE_PROVIDER_SITE_OTHER): Payer: Medicare Other | Admitting: *Deleted

## 2015-07-24 DIAGNOSIS — I5022 Chronic systolic (congestive) heart failure: Secondary | ICD-10-CM | POA: Diagnosis not present

## 2015-07-24 DIAGNOSIS — I429 Cardiomyopathy, unspecified: Secondary | ICD-10-CM

## 2015-07-24 NOTE — Progress Notes (Signed)
Remote ICD transmission.   

## 2015-07-25 ENCOUNTER — Other Ambulatory Visit (HOSPITAL_COMMUNITY): Payer: Self-pay | Admitting: *Deleted

## 2015-07-25 ENCOUNTER — Ambulatory Visit: Payer: Self-pay | Admitting: *Deleted

## 2015-07-25 ENCOUNTER — Other Ambulatory Visit: Payer: Self-pay | Admitting: Internal Medicine

## 2015-07-25 ENCOUNTER — Other Ambulatory Visit (INDEPENDENT_AMBULATORY_CARE_PROVIDER_SITE_OTHER): Payer: Medicare Other | Admitting: *Deleted

## 2015-07-25 DIAGNOSIS — D649 Anemia, unspecified: Secondary | ICD-10-CM

## 2015-07-25 DIAGNOSIS — I4901 Ventricular fibrillation: Secondary | ICD-10-CM | POA: Diagnosis not present

## 2015-07-25 DIAGNOSIS — I480 Paroxysmal atrial fibrillation: Secondary | ICD-10-CM

## 2015-07-25 DIAGNOSIS — I5023 Acute on chronic systolic (congestive) heart failure: Secondary | ICD-10-CM | POA: Diagnosis not present

## 2015-07-25 DIAGNOSIS — I509 Heart failure, unspecified: Secondary | ICD-10-CM | POA: Diagnosis not present

## 2015-07-25 DIAGNOSIS — Z7901 Long term (current) use of anticoagulants: Secondary | ICD-10-CM

## 2015-07-25 DIAGNOSIS — I4891 Unspecified atrial fibrillation: Secondary | ICD-10-CM | POA: Diagnosis not present

## 2015-07-25 DIAGNOSIS — I50810 Right heart failure, unspecified: Secondary | ICD-10-CM

## 2015-07-25 DIAGNOSIS — Z95811 Presence of heart assist device: Secondary | ICD-10-CM

## 2015-07-25 LAB — CUP PACEART REMOTE DEVICE CHECK
Battery Remaining Longevity: 43 mo
Battery Remaining Percentage: 43 %
Battery Voltage: 2.92 V
Brady Statistic AP VP Percent: 1 %
Brady Statistic AP VS Percent: 34 %
Brady Statistic AS VP Percent: 1 %
Brady Statistic AS VS Percent: 64 %
Brady Statistic RA Percent Paced: 32 %
Brady Statistic RV Percent Paced: 1 %
Date Time Interrogation Session: 20161121075653
HighPow Impedance: 41 Ohm
Implantable Lead Implant Date: 20110104
Implantable Lead Implant Date: 20110104
Implantable Lead Location: 753859
Implantable Lead Location: 753860
Implantable Lead Model: 7121
Lead Channel Impedance Value: 280 Ohm
Lead Channel Impedance Value: 310 Ohm
Lead Channel Sensing Intrinsic Amplitude: 0.8 mV
Lead Channel Sensing Intrinsic Amplitude: 8.9 mV
Lead Channel Setting Pacing Amplitude: 2 V
Lead Channel Setting Pacing Amplitude: 2.5 V
Lead Channel Setting Pacing Pulse Width: 0.5 ms
Lead Channel Setting Sensing Sensitivity: 0.5 mV
Pulse Gen Serial Number: 754815

## 2015-07-25 LAB — PROTIME-INR
INR: 2.13 — ABNORMAL HIGH (ref <1.50)
Prothrombin Time: 24.1 seconds — ABNORMAL HIGH (ref 11.6–15.2)

## 2015-07-25 NOTE — Addendum Note (Signed)
Addended by: Eulis Foster on: 07/25/2015 08:38 AM   Modules accepted: Orders

## 2015-07-25 NOTE — Addendum Note (Signed)
Addended by: Eulis Foster on: 07/25/2015 09:26 AM   Modules accepted: Orders

## 2015-07-26 ENCOUNTER — Encounter: Payer: Self-pay | Admitting: Cardiology

## 2015-07-26 NOTE — Progress Notes (Signed)
LVAD Reassessment Psychosocial Screening  Date Initiated: 07/11/2015  LVAD Implant date:  04/06/2013 Annual: XX 28-month:  Name:  Brianna Hanson Address:  [place] [address] Home Phone:  [phone] (home) Cell:   Telephone Information:  Mobile [phone]   Marital Status:  Significant other (40years) Faith:  Language:  English DOB:  26-May-1935   Medical & Follow-up Adherence to Medical regimen/INR checks:  compliant Medication adherence:  compliant Physician/Clinic Appointment Attendance:  compliant Do you smoke?  no Do you drink alcohol?  socially Have you ever used illegal drugs or misused medications?  no Any changes to your medical history since receiving the LVAD?  none Do you have needed supplies for care of LVAD?  yes Do you have a charge/co-pay for supplies/INR visits?  No charges Emergency Plan for electrical outage?  generator Do you drive?  no What is your plan for transportation? Brianna Hanson- SO handles all driving needs Advance Directives: <no information>  Discussed and will complete  Psychological Health Appearance:  Well groomed Mental Status:  Alert and oriented Eye Contact:  good Thought Content:  coherent Speech:  clear Mood:  Stable and positive Affect:  positive Insight:  good Judgement: sound Interaction Style:  positive  Family/Social Information Who lives in your home? Name:   Relationship:   Brianna Hanson   SO 40years   Other family members/support persons in your life? Name:   Relationship:   Brianna Hanson   son Caregiving Needs Who is the primary caregiver? Name: Brianna Hanson Who is the secondary caregiver? Name: n/a  Self-care: Assist with some ADL's Ambulation: Independent - rollator for long distances Limitations: none Any barriers impacting ability to participate in care? none Who does your driveline site dressing changes?  Brianna Hanson How often are dressings changed?  weekly  Community Are you active with community  agencies/resources/homecare? no Are you active in a church, synagogue, mosque or other faith based community? [facility] What do you do for fun?  Hobbies?  Interests? Cooking, shopping and treasure hunting  Financial Information What is your source of income? Pension and SSA Are you working? No Do you have difficulty meeting your monthly expenses? 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 No  Mental Health History How have you been feeling in the past year? Good Do you have any concerns with body image with the LVAD implants? no How do you handle stressful situations? "I don't have stress" Have you had any past or current thoughts of suicide?no How many hours do you sleep at night? 11-12 hours How is your appetite? good   Hospital Anxiety and Depression Screen (HADS) score: 6 Do you need to see a counselor, psychiatrist or therapist?   no Do you attend or wish to attend the LVAD support group? Attends regularly  Patient stated explanation how their life has improved as a result of receiving the LVAD:  "a whole lot- everything is better" Patient's biggest concern or fear about living with the LVAD: no fears Patient stated barriers to living with the LVAD: no Patient feels they had adequate support from the VAD team?  "Very much so!"   Caregiver questions How has your life been affected since your spouse/partner received the LVAD?  "somethings are harder because of the time" Caregiver stated biggest concern or fear about caregiving with an LVAD patient: "I made provisions  for any concerns" Caregiver stated coping strategies for concerns and fears:  "just deal with it" Caregiver stated barriers to caregiving: no Caregiver feels receives adequate support from the VAD team? "super"     Clinical  Impressions/Recommendations: Patient is a 80yo female who has good support from her caregiver and Ocean of 40 years. Patient is compliant with her medical regimen and denies any use of tobacco, alcohol or illegal substances. Patient reports she has all her supplies for the LVAD and denies any concerns with dressing changes or equipment. She requires some assistance with ADL's and has a rollator for long distances. She enjoys cooking and shopping and appears to have fun with her caregiver and SO Brianna Hanson. She denies any financial issues and has Medicare and BC BS as her insurance. She states she has been feeling good over the past year and denies any concerns with mental health. She scored a 6 on the HADS which falls within normal limits. She regularly attends the LVAD support group and states her life has been improved a "whole lot" since receiving the LVAD. She has no fears or concerns at this time. Brianna Hanson reports that somethings are harder because of the time involved but he has made provisions for any concerns. He also denies any concerns or barriers to caregiving. Both state good support from the VAD team and deny any concerns at this time.  Clinical Intervention Needed: CSW will continue to provide supportive intervention and continue to encourage attendance to the LVAD Support Group.   Louann Liv, [place]

## 2015-08-01 ENCOUNTER — Other Ambulatory Visit (INDEPENDENT_AMBULATORY_CARE_PROVIDER_SITE_OTHER): Payer: Medicare Other

## 2015-08-01 ENCOUNTER — Encounter: Payer: Self-pay | Admitting: Internal Medicine

## 2015-08-01 ENCOUNTER — Ambulatory Visit (HOSPITAL_COMMUNITY): Payer: Self-pay | Admitting: Infectious Diseases

## 2015-08-01 ENCOUNTER — Other Ambulatory Visit (HOSPITAL_COMMUNITY): Payer: Self-pay | Admitting: Infectious Diseases

## 2015-08-01 ENCOUNTER — Ambulatory Visit (INDEPENDENT_AMBULATORY_CARE_PROVIDER_SITE_OTHER): Payer: Medicare Other | Admitting: Internal Medicine

## 2015-08-01 ENCOUNTER — Ambulatory Visit (HOSPITAL_COMMUNITY)
Admission: RE | Admit: 2015-08-01 | Discharge: 2015-08-01 | Disposition: A | Discharge status: Home / Self Care | Payer: Medicare Other | Source: Ambulatory Visit | Attending: Internal Medicine | Admitting: Internal Medicine

## 2015-08-01 VITALS — Ht 60.0 in | Wt 183.0 lb

## 2015-08-01 DIAGNOSIS — Z5181 Encounter for therapeutic drug level monitoring: Secondary | ICD-10-CM

## 2015-08-01 DIAGNOSIS — Z7901 Long term (current) use of anticoagulants: Secondary | ICD-10-CM

## 2015-08-01 DIAGNOSIS — R791 Abnormal coagulation profile: Secondary | ICD-10-CM

## 2015-08-01 DIAGNOSIS — I5022 Chronic systolic (congestive) heart failure: Secondary | ICD-10-CM | POA: Insufficient documentation

## 2015-08-01 DIAGNOSIS — K648 Other hemorrhoids: Secondary | ICD-10-CM

## 2015-08-01 DIAGNOSIS — D500 Iron deficiency anemia secondary to blood loss (chronic): Secondary | ICD-10-CM

## 2015-08-01 LAB — CBC WITH DIFFERENTIAL/PLATELET
Basophils Absolute: 0 10*3/uL (ref 0.0–0.1)
Basophils Relative: 0 % (ref 0.0–3.0)
Eosinophils Absolute: 0.2 10*3/uL (ref 0.0–0.7)
Eosinophils Relative: 2.5 % (ref 0.0–5.0)
HCT: 26.5 % — ABNORMAL LOW (ref 36.0–46.0)
Hemoglobin: 8.2 g/dL — ABNORMAL LOW (ref 12.0–15.0)
Lymphocytes Relative: 23.5 % (ref 12.0–46.0)
Lymphs Abs: 1.5 10*3/uL (ref 0.7–4.0)
MCHC: 31.2 g/dL (ref 30.0–36.0)
MCV: 75.7 fl — ABNORMAL LOW (ref 78.0–100.0)
Monocytes Absolute: 0.8 10*3/uL (ref 0.1–1.0)
Monocytes Relative: 12.9 % — ABNORMAL HIGH (ref 3.0–12.0)
Neutro Abs: 4 10*3/uL (ref 1.4–7.7)
Neutrophils Relative %: 61.1 % (ref 43.0–77.0)
Platelets: 272 10*3/uL (ref 150.0–400.0)
RBC: 3.49 Mil/uL — ABNORMAL LOW (ref 3.87–5.11)
RDW: 29.8 % — ABNORMAL HIGH (ref 11.5–15.5)
WBC: 6.5 10*3/uL (ref 4.0–10.5)

## 2015-08-01 LAB — PROTIME-INR
INR: >10 ratio (ref 0.8–1.0)
INR: 2.16 — ABNORMAL HIGH (ref 0.00–1.49)
Prothrombin Time: >120 s (ref 9.6–13.1)
Prothrombin Time: 23.9 seconds — ABNORMAL HIGH (ref 11.6–15.2)

## 2015-08-01 NOTE — Progress Notes (Signed)
   Subjective:    Patient ID: Brianna Hanson, female    DOB: June 30, 1935, 80 y.o.   MRN: [id number] Cc: bleeding hemorrhoids HPI Very nice elderly woman with CHF and an LVAD in place, on chronic warfarin. She has a hx of hemorrhoids and remote bleeding - had seen Dr. Benson Norway yrs ago - colonoscopy 10 yrs ago w/o polyps but + hemorrhoids.  In past several mos has had painless bright red blood on the paper and in the commode - and Hgb declined from 9 to 7. Stools NL o/w, 2/day no straining. Probably has some prolapse - protrusion.   Spoke to [name] and she was Tx w/ HC suppositories and while on those less beeding and is better overall but still sees blood w/ most defecation.  Medications, allergies, past medical history, past surgical history, family history and social history are reviewed and updated in the EMR.  Review of Systems Chronic dyspnea, some fatigue All other ROS negative    Objective:   Physical Exam @Pulse    Ht 5' (1.524 m)  Wt 183 lb (83.008 kg)  BMI 35.74 kg/m2@  General:  Well-developed, well-nourished and in no acute distress Eyes:  anicteric.  Lungs: Clear to auscultation bilaterally. Heart:  S1S2, no rubs, murmurs, gallops. + LVAD hum Abdomen:  soft, obese  Rectal: Brianna Hanson, CMA present.  Anoderm NL Sl reduced resting tone, no mass and no stool   Anoscopy was performed with the patient in the left lateral decubitus position while a chaperone was present and revealed Grade 2 inflamed hemorrhoids all 3 positions LL worst   Extremities:   no edema, cyanosis or clubbing Skin   no rash. Neuro:  A&O x 3.  Psych:  appropriate Hanson and  Affect.   Data Reviewed: CBC Latest Ref Rng 08/01/2015 07/11/2015 05/11/2015  WBC 4.0 - 10.5 K/uL 6.5 6.1 7.2  Hemoglobin 12.0 - 15.0 g/dL 8.2 Repeated and verified X2.(L) 7.6(L) 9.0(L)  Hematocrit 36.0 - 46.0 % 26.5 Repeated and verified X2.(L) 26.3(L) 29.6(L)  Platelets 150.0 - 400.0 K/uL 272.0 252 257    Lab  Results  Component Value Date   INR >10.0* 08/01/2015   INR 2.13* 07/25/2015   INR 2.02* 07/11/2015       Assessment & Plan:  Hemorrhoids, internal, with bleeding - Grade 2 prolapse  Anemia due to chronic blood loss - Plan: CBC with Differential/Platelet, Protime-INR  Warfarin anticoagulation - Plan: Protime-INR  Intial INR was very high - repeat   Lab Results  Component Value Date   INR 2.16* 08/01/2015   INR >10.0* 08/01/2015   INR 2.13* 07/25/2015    CBC Latest Ref Rng 08/01/2015 07/11/2015 05/11/2015  WBC 4.0 - 10.5 K/uL 6.5 6.1 7.2  Hemoglobin 12.0 - 15.0 g/dL 8.2 Repeated and verified X2.(L) 7.6(L) 9.0(L)  Hematocrit 36.0 - 46.0 % 26.5 Repeated and verified X2.(L) 26.3(L) 29.6(L)  Platelets 150.0 - 400.0 K/uL 272.0 252 257   I think if INR stays around 2 we can do banding and deal with bleeding if it occurs - will discuss w/ [name] and contact her.  I appreciate the opportunity to care for this patient.

## 2015-08-01 NOTE — Progress Notes (Signed)
Quick Note:  Patient will have INR recheck via LVAD clinic ______

## 2015-08-01 NOTE — Patient Instructions (Addendum)
  Your physician has requested that you go to the basement for the following lab work before leaving today: CBC/diff, PT/INR  We will obtain your colon from Dr Benson Norway for review.  I appreciate the opportunity to care for you. Silvano Rusk, MD, [facility]

## 2015-08-02 ENCOUNTER — Encounter: Payer: Self-pay | Admitting: Internal Medicine

## 2015-08-06 ENCOUNTER — Encounter (HOSPITAL_COMMUNITY): Payer: Self-pay | Admitting: Emergency Medicine

## 2015-08-06 ENCOUNTER — Emergency Department (HOSPITAL_COMMUNITY): Payer: Medicare Other

## 2015-08-06 ENCOUNTER — Inpatient Hospital Stay (HOSPITAL_COMMUNITY)
Admission: EM | Admit: 2015-08-06 | Discharge: 2015-08-18 | DRG: 445 | Disposition: A | Discharge status: Home / Self Care | Payer: Medicare Other | Attending: Internal Medicine | Admitting: Internal Medicine

## 2015-08-06 DIAGNOSIS — Z79899 Other long term (current) drug therapy: Secondary | ICD-10-CM

## 2015-08-06 DIAGNOSIS — K8020 Calculus of gallbladder without cholecystitis without obstruction: Secondary | ICD-10-CM | POA: Diagnosis not present

## 2015-08-06 DIAGNOSIS — K819 Cholecystitis, unspecified: Secondary | ICD-10-CM

## 2015-08-06 DIAGNOSIS — K812 Acute cholecystitis with chronic cholecystitis: Secondary | ICD-10-CM | POA: Diagnosis not present

## 2015-08-06 DIAGNOSIS — D472 Monoclonal gammopathy: Secondary | ICD-10-CM | POA: Diagnosis present

## 2015-08-06 DIAGNOSIS — R1084 Generalized abdominal pain: Secondary | ICD-10-CM | POA: Diagnosis not present

## 2015-08-06 DIAGNOSIS — Z7901 Long term (current) use of anticoagulants: Secondary | ICD-10-CM

## 2015-08-06 DIAGNOSIS — D500 Iron deficiency anemia secondary to blood loss (chronic): Secondary | ICD-10-CM | POA: Diagnosis present

## 2015-08-06 DIAGNOSIS — I130 Hypertensive heart and chronic kidney disease with heart failure and stage 1 through stage 4 chronic kidney disease, or unspecified chronic kidney disease: Secondary | ICD-10-CM | POA: Diagnosis not present

## 2015-08-06 DIAGNOSIS — I959 Hypotension, unspecified: Secondary | ICD-10-CM | POA: Diagnosis not present

## 2015-08-06 DIAGNOSIS — N179 Acute kidney failure, unspecified: Secondary | ICD-10-CM | POA: Diagnosis not present

## 2015-08-06 DIAGNOSIS — Z95811 Presence of heart assist device: Secondary | ICD-10-CM | POA: Diagnosis not present

## 2015-08-06 DIAGNOSIS — K810 Acute cholecystitis: Secondary | ICD-10-CM

## 2015-08-06 DIAGNOSIS — R109 Unspecified abdominal pain: Secondary | ICD-10-CM | POA: Diagnosis not present

## 2015-08-06 DIAGNOSIS — K2970 Gastritis, unspecified, without bleeding: Secondary | ICD-10-CM | POA: Diagnosis not present

## 2015-08-06 DIAGNOSIS — Z8674 Personal history of sudden cardiac arrest: Secondary | ICD-10-CM

## 2015-08-06 DIAGNOSIS — R1111 Vomiting without nausea: Secondary | ICD-10-CM | POA: Diagnosis not present

## 2015-08-06 DIAGNOSIS — M8580 Other specified disorders of bone density and structure, unspecified site: Secondary | ICD-10-CM | POA: Diagnosis present

## 2015-08-06 DIAGNOSIS — I429 Cardiomyopathy, unspecified: Secondary | ICD-10-CM | POA: Diagnosis present

## 2015-08-06 DIAGNOSIS — Z95810 Presence of automatic (implantable) cardiac defibrillator: Secondary | ICD-10-CM

## 2015-08-06 DIAGNOSIS — R1031 Right lower quadrant pain: Secondary | ICD-10-CM

## 2015-08-06 DIAGNOSIS — R1110 Vomiting, unspecified: Secondary | ICD-10-CM | POA: Diagnosis not present

## 2015-08-06 DIAGNOSIS — Z90710 Acquired absence of both cervix and uterus: Secondary | ICD-10-CM

## 2015-08-06 DIAGNOSIS — Z87891 Personal history of nicotine dependence: Secondary | ICD-10-CM

## 2015-08-06 DIAGNOSIS — N183 Chronic kidney disease, stage 3 (moderate): Secondary | ICD-10-CM | POA: Diagnosis present

## 2015-08-06 DIAGNOSIS — E876 Hypokalemia: Secondary | ICD-10-CM | POA: Diagnosis present

## 2015-08-06 DIAGNOSIS — R0602 Shortness of breath: Secondary | ICD-10-CM

## 2015-08-06 DIAGNOSIS — I5022 Chronic systolic (congestive) heart failure: Secondary | ICD-10-CM | POA: Diagnosis present

## 2015-08-06 DIAGNOSIS — D62 Acute posthemorrhagic anemia: Secondary | ICD-10-CM | POA: Insufficient documentation

## 2015-08-06 DIAGNOSIS — I480 Paroxysmal atrial fibrillation: Secondary | ICD-10-CM | POA: Diagnosis present

## 2015-08-06 LAB — I-STAT CHEM 8, ED
BUN: 22 mg/dL — ABNORMAL HIGH (ref 6–20)
Calcium, Ion: 1.05 mmol/L — ABNORMAL LOW (ref 1.13–1.30)
Chloride: 106 mmol/L (ref 101–111)
Creatinine, Ser: 1.8 mg/dL — ABNORMAL HIGH (ref 0.44–1.00)
Glucose, Bld: 137 mg/dL — ABNORMAL HIGH (ref 65–99)
HCT: 30 % — ABNORMAL LOW (ref 36.0–46.0)
Hemoglobin: 10.2 g/dL — ABNORMAL LOW (ref 12.0–15.0)
Potassium: 3.1 mmol/L — ABNORMAL LOW (ref 3.5–5.1)
Sodium: 140 mmol/L (ref 135–145)
TCO2: 21 mmol/L (ref 0–100)

## 2015-08-06 LAB — LACTATE DEHYDROGENASE: LDH: 277 U/L — ABNORMAL HIGH (ref 98–192)

## 2015-08-06 LAB — CBC WITH DIFFERENTIAL/PLATELET
Basophils Absolute: 0 10*3/uL (ref 0.0–0.1)
Basophils Absolute: 0 10*3/uL (ref 0.0–0.1)
Basophils Relative: 0 %
Basophils Relative: 0 %
Eosinophils Absolute: 0.2 10*3/uL (ref 0.0–0.7)
Eosinophils Absolute: 0.1 10*3/uL (ref 0.0–0.7)
Eosinophils Relative: 2 %
Eosinophils Relative: 1 %
HCT: 26.4 % — ABNORMAL LOW (ref 36.0–46.0)
HCT: 25.1 % — ABNORMAL LOW (ref 36.0–46.0)
Hemoglobin: 8.1 g/dL — ABNORMAL LOW (ref 12.0–15.0)
Hemoglobin: 7.6 g/dL — ABNORMAL LOW (ref 12.0–15.0)
Lymphocytes Relative: 23 %
Lymphocytes Relative: 12 %
Lymphs Abs: 1.9 10*3/uL (ref 0.7–4.0)
Lymphs Abs: 0.9 10*3/uL (ref 0.7–4.0)
MCH: 23.5 pg — ABNORMAL LOW (ref 26.0–34.0)
MCH: 23.3 pg — ABNORMAL LOW (ref 26.0–34.0)
MCHC: 30.7 g/dL (ref 30.0–36.0)
MCHC: 30.3 g/dL (ref 30.0–36.0)
MCV: 76.7 fL — ABNORMAL LOW (ref 78.0–100.0)
MCV: 77 fL — ABNORMAL LOW (ref 78.0–100.0)
Monocytes Absolute: 0.8 10*3/uL (ref 0.1–1.0)
Monocytes Absolute: 0.6 10*3/uL (ref 0.1–1.0)
Monocytes Relative: 10 %
Monocytes Relative: 7 %
Neutro Abs: 5.3 10*3/uL (ref 1.7–7.7)
Neutro Abs: 6.3 10*3/uL (ref 1.7–7.7)
Neutrophils Relative %: 65 %
Neutrophils Relative %: 80 %
Platelets: 245 10*3/uL (ref 150–400)
Platelets: 221 10*3/uL (ref 150–400)
RBC: 3.44 MIL/uL — ABNORMAL LOW (ref 3.87–5.11)
RBC: 3.26 MIL/uL — ABNORMAL LOW (ref 3.87–5.11)
RDW: 26.5 % — ABNORMAL HIGH (ref 11.5–15.5)
RDW: 26.6 % — ABNORMAL HIGH (ref 11.5–15.5)
WBC: 8.2 10*3/uL (ref 4.0–10.5)
WBC: 7.9 10*3/uL (ref 4.0–10.5)

## 2015-08-06 LAB — I-STAT TROPONIN, ED: Troponin i, poc: 0.01 ng/mL (ref 0.00–0.08)

## 2015-08-06 LAB — PROTIME-INR
INR: 2.54 — ABNORMAL HIGH (ref 0.00–1.49)
Prothrombin Time: 27 seconds — ABNORMAL HIGH (ref 11.6–15.2)

## 2015-08-06 MED ORDER — POTASSIUM CHLORIDE IN NACL 40-0.9 MEQ/L-% IV SOLN
INTRAVENOUS | Status: DC
Start: 2015-08-06 — End: 2015-08-07
  Administered 2015-08-06: 75 mL/h via INTRAVENOUS
  Filled 2015-08-06 (×2): qty 1000

## 2015-08-06 MED ORDER — CARVEDILOL 6.25 MG PO TABS
18.7500 mg | ORAL_TABLET | Freq: Two times a day (BID) | ORAL | Status: DC
  Administered 2015-08-07: 18.75 mg via ORAL
  Filled 2015-08-06 (×2): qty 1

## 2015-08-06 MED ORDER — INFLUENZA VAC SPLIT QUAD 0.5 ML IM SUSY: 0.5000 mL | PREFILLED_SYRINGE | INTRAMUSCULAR | Status: DC

## 2015-08-06 MED ORDER — AMLODIPINE BESYLATE 10 MG PO TABS
10.0000 mg | ORAL_TABLET | Freq: Two times a day (BID) | ORAL | Status: DC
  Administered 2015-08-06 – 2015-08-07 (×2): 10 mg via ORAL
  Filled 2015-08-06 (×2): qty 1

## 2015-08-06 MED ORDER — FERROUS SULFATE 325 (65 FE) MG PO TABS
325.0000 mg | ORAL_TABLET | Freq: Two times a day (BID) | ORAL | Status: DC
  Administered 2015-08-07 – 2015-08-18 (×21): 325 mg via ORAL
  Filled 2015-08-06 (×21): qty 1

## 2015-08-06 MED ORDER — SODIUM CHLORIDE 0.9 % IV BOLUS (SEPSIS)
1000.0000 mL | Freq: Once | INTRAVENOUS | Status: AC
  Administered 2015-08-06: 1000 mL via INTRAVENOUS

## 2015-08-06 MED ORDER — FENTANYL CITRATE (PF) 100 MCG/2ML IJ SOLN
25.0000 ug | INTRAMUSCULAR | Status: DC | PRN
  Administered 2015-08-06 – 2015-08-10 (×16): 50 ug via INTRAVENOUS
  Filled 2015-08-06 (×16): qty 2

## 2015-08-06 MED ORDER — ONDANSETRON HCL 4 MG/2ML IJ SOLN
4.0000 mg | Freq: Once | INTRAMUSCULAR | Status: AC
  Administered 2015-08-06: 4 mg via INTRAVENOUS
  Filled 2015-08-06: qty 2

## 2015-08-06 MED ORDER — HYDROMORPHONE HCL 1 MG/ML IJ SOLN
0.5000 mg | Freq: Once | INTRAMUSCULAR | Status: AC
  Administered 2015-08-06: 0.5 mg via INTRAVENOUS
  Filled 2015-08-06: qty 1

## 2015-08-06 MED ORDER — HYDROMORPHONE HCL 1 MG/ML IJ SOLN
1.0000 mg | Freq: Once | INTRAMUSCULAR | Status: AC
  Administered 2015-08-06: 1 mg via INTRAVENOUS
  Filled 2015-08-06: qty 1

## 2015-08-06 MED ORDER — HYDRALAZINE HCL 50 MG PO TABS
100.0000 mg | ORAL_TABLET | Freq: Three times a day (TID) | ORAL | Status: DC
  Administered 2015-08-06 – 2015-08-07 (×2): 100 mg via ORAL
  Filled 2015-08-06 (×9): qty 2

## 2015-08-06 MED ORDER — AMIODARONE HCL 200 MG PO TABS
200.0000 mg | ORAL_TABLET | Freq: Every day | ORAL | Status: DC
  Administered 2015-08-07 – 2015-08-18 (×12): 200 mg via ORAL
  Filled 2015-08-06 (×12): qty 1

## 2015-08-06 MED ORDER — SODIUM CHLORIDE 0.9 % IV SOLN: INTRAVENOUS | Status: DC

## 2015-08-06 MED ORDER — CEPHALEXIN 500 MG PO CAPS
500.0000 mg | ORAL_CAPSULE | Freq: Four times a day (QID) | ORAL | Status: DC
  Administered 2015-08-06 – 2015-08-07 (×4): 500 mg via ORAL
  Filled 2015-08-06: qty 1
  Filled 2015-08-06: qty 2
  Filled 2015-08-06 (×2): qty 1

## 2015-08-06 MED ORDER — ONDANSETRON HCL 4 MG/2ML IJ SOLN
4.0000 mg | INTRAMUSCULAR | Status: DC | PRN
  Administered 2015-08-07 – 2015-08-16 (×15): 4 mg via INTRAVENOUS
  Filled 2015-08-06 (×15): qty 2

## 2015-08-06 MED ORDER — GABAPENTIN 100 MG PO CAPS
100.0000 mg | ORAL_CAPSULE | Freq: Every day | ORAL | Status: DC
  Administered 2015-08-06 – 2015-08-17 (×9): 100 mg via ORAL
  Filled 2015-08-06 (×10): qty 1

## 2015-08-06 MED ORDER — DOCUSATE SODIUM 100 MG PO CAPS
100.0000 mg | ORAL_CAPSULE | Freq: Two times a day (BID) | ORAL | Status: DC
  Administered 2015-08-06 – 2015-08-17 (×15): 100 mg via ORAL
  Filled 2015-08-06 (×19): qty 1

## 2015-08-06 MED ORDER — AMIODARONE HCL 200 MG PO TABS: 200.0000 mg | ORAL_TABLET | Freq: Every day | ORAL | Status: DC

## 2015-08-06 MED ORDER — PANTOPRAZOLE SODIUM 40 MG PO TBEC
40.0000 mg | DELAYED_RELEASE_TABLET | Freq: Every day | ORAL | Status: DC
  Administered 2015-08-06 – 2015-08-18 (×13): 40 mg via ORAL
  Filled 2015-08-06 (×13): qty 1

## 2015-08-06 MED ORDER — CARVEDILOL 6.25 MG PO TABS
18.7500 mg | ORAL_TABLET | Freq: Once | ORAL | Status: AC
  Administered 2015-08-06: 18.75 mg via ORAL
  Filled 2015-08-06: qty 1

## 2015-08-06 MED ORDER — ACETAMINOPHEN 650 MG RE SUPP: 650.0000 mg | RECTAL | Status: DC | PRN

## 2015-08-06 MED ORDER — IOHEXOL 300 MG/ML  SOLN
100.0000 mL | Freq: Once | INTRAMUSCULAR | Status: AC | PRN
  Administered 2015-08-06: 80 mL via INTRAVENOUS

## 2015-08-06 MED ORDER — ACETAMINOPHEN 325 MG PO TABS
650.0000 mg | ORAL_TABLET | ORAL | Status: DC | PRN
  Administered 2015-08-07 – 2015-08-13 (×3): 650 mg via ORAL
  Filled 2015-08-06 (×3): qty 2

## 2015-08-06 NOTE — ED Notes (Signed)
Pt transported to CT by this RN.

## 2015-08-06 NOTE — ED Notes (Signed)
Dr. Wentz MD at bedside. 

## 2015-08-06 NOTE — H&P (Signed)
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 [name] (follwoed q 6 months). Underwent implantation of the HeartMate II LVAD on 04/06/13 for DT. She is not on aspirin due to dizziness.  Recently having trouble with anemia due to hemorrhoidal bleeding. Seen by GI but no intervention yet. Otherwise doing well. Had abrupt onset of severe RLQ ab pain today around 3p with nausea. Denies fevers, chills, diarrhea, trauma, dysuria, bleeding, etc. In ER very tender along drivelie exit site but site otherwise looked fine. WBC normal. Creatinine up slightly 1.5-> 1.8. Hgb stable. CT scan of ab/pelvis normal  Denies LVAD alarms. Denies driveline trauma, erythema or drainage. Denies ICD shocks.    LVAD INTERROGATION:  HeartMate II LVAD:  Flow 5.8  liters/min, speed 9200, power 5.7, PI 6.7.  1-3 PI events per day. (1 this afternoon)    Review of Systems: [y] = yes, [ ]  = no   General: Weight gain [ ] ; Weight loss [ ] ; Anorexia [ ] ; Fatigue [ ] ; Fever [ ] ; Chills [ ] ; Weakness [ ]   Cardiac: Chest pain/pressure [ ] ; Resting SOB [ ] ; Exertional SOB [ ] ; Orthopnea [ ] ; Pedal Edema [ ] ; Palpitations [ ] ; Syncope [ ] ; Presyncope [ ] ; Paroxysmal nocturnal dyspnea[ ]   Pulmonary: Cough [ ] ; Wheezing[ ] ; Hemoptysis[ ] ; Sputum [ ] ; Snoring [ ]   GI: Vomiting[y ]; Dysphagia[ ] ; Melena[ ] ; Hematochezia [ ] ; Heartburn[ ] ; Abdominal pain Blue.Reese ]; Constipation [ ] ; Diarrhea [ ] ; BRBPR [ ]   GU: Hematuria[ ] ; Dysuria [ ] ; Nocturia[ ]   Vascular: Pain in legs with walking [ ] ; Pain in feet with lying flat [ ] ; Non-healing sores [ ] ; Stroke [ ] ; TIA [ ] ; Slurred speech [ ] ;  Neuro: Headaches[ ] ; Vertigo[ ] ; Seizures[ ] ; Paresthesias[ ] ;Blurred vision [ ] ; Diplopia [ ] ; Vision changes [ ]   Ortho/Skin: Arthritis Blue.Reese ]; Joint pain  [ ] ; Muscle pain [ ] ; Joint swelling [ ] ; Back Pain [ ] ; Rash [ ]   Psych: Depression[ ] ; Anxiety[ ]   Heme: Bleeding problems [ ] ; Clotting disorders [ ] ; Anemia [ ]   Endocrine: Diabetes [ ] ; Thyroid dysfunction[ ]   Home Medications Prior to Admission medications   Medication Sig Start Date End Date Taking? Authorizing Provider  amiodarone (PACERONE) 200 MG tablet TAKE 1 TABLET BY MOUTH EVERY DAY 07/26/15  Yes Thompson Grayer, MD  amLODipine (NORVASC) 10 MG tablet TAKE 1 TABLET BY MOUTH TWICE A DAY 03/28/15  Yes Shaune Pascal Yanin Muhlestein, MD  carvedilol (COREG) 12.5 MG tablet TAKE 1 & 1/2 TABS TWICE DAILY 05/15/15  Yes Jolaine Artist, MD  Cholecalciferol (VITAMIN D3) 1000 UNITS CAPS Take 1 tablet by mouth daily.     Yes Historical Provider, MD  docusate sodium (COLACE) 100 MG capsule Take 1 capsule (100 mg total) by mouth 2 (two) times daily. 07/11/15  Yes Jolaine Artist, MD  ferrous sulfate 325 (65 FE) MG tablet Take 1 tablet (325 mg total) by mouth 2 (two) times daily with a meal. 07/11/15  Yes Jolaine Artist, MD  gabapentin (NEURONTIN) 100 MG capsule TAKE 1 CAPSULE BY MOUTH AT BEDTIME OR AS DIRECTED 02/07/15  Yes Amy D Clegg, NP  hydrALAZINE (APRESOLINE) 100  MG tablet TAKE 1 TABLET BY MOUTH 3 TIMES A DAY 05/04/15  Yes Shaune Pascal Mathilde Mcwherter, MD  losartan (COZAAR) 50 MG tablet TAKE 1 TABLET BY MOUTH EVERY DAY 05/15/15  Yes Amy D Clegg, NP  pantoprazole (PROTONIX) 40 MG tablet TAKE 1 TABLET BY MOUTH EVERY DAY 07/25/15  Yes Jolaine Artist, MD  spironolactone (ALDACTONE) 25 MG tablet TAKE 1 TABLET BY MOUTH EVERY DAY 07/25/15  Yes Jolaine Artist, MD  torsemide (DEMADEX) 20 MG tablet Take 1 tablet (20 mg total) by mouth daily as needed. weight gain 3 lbs in 24 hours 05/11/15  Yes Jolaine Artist, MD  traMADol (ULTRAM) 50 MG tablet Take 1 tablet (50 mg total) by mouth every 6 (six) hours as needed for moderate pain. for pain 05/11/15  Yes Jolaine Artist, MD  warfarin (COUMADIN) 2.5 MG tablet Take  2.5-5 mg by mouth daily. 2.5mg  daily except 5mg  on Wednesdays   Yes Historical Provider, MD    Past Medical History: Past Medical History  Diagnosis Date  . Cardiac arrest - ventricular fibrillation 12/10    with successful resucitation, S/p ICD  . ICD (implantable cardiac defibrillator) in place     she has received appropriate therapy for VF  . Atrial fibrillation or flutter   . Osteopenia   . HTN (hypertension)     moderate  . Nonischemic cardiomyopathy ([place])     followed by Dr April Holding at [facility]  . CHF (congestive heart failure) ([place])   . Anemia   . Plasma cell disorder 03/20/2012  . Plasma cell disorder 03/20/2012  . Sessile colonic polyp 2011    Dr Benson [place]  . Diverticula of colon 2011  . Internal and external hemorrhoids without complication [id number]    Past Surgical History: Past Surgical History  Procedure Laterality Date  . Abdominal hysterectomy    . Cardiac catheterization    . Cardiac defibrillator placement      by JA for secondary prevention of sudden death  . Insertion of implantable left ventricular assist device N/A 04/06/2013    Procedure: INSERTION OF IMPLANTABLE LEFT VENTRICULAR ASSIST DEVICE;  Surgeon: Gaye Pollack, MD;  Location: [place];  Service: Open Heart Surgery;  Laterality: N/A;  . Placement of centrimag ventricular assist device Right 04/06/2013    Procedure: PLACEMENT OF CENTRIMAG VENTRICULAR ASSIST DEVICE;  Surgeon: Gaye Pollack, MD;  Location: [place];  Service: Open Heart Surgery;  Laterality: Right;  . Intraoperative transesophageal echocardiogram N/A 04/06/2013    Procedure: INTRAOPERATIVE TRANSESOPHAGEAL ECHOCARDIOGRAM;  Surgeon: Gaye Pollack, MD;  Location: [facility];  Service: Open Heart Surgery;  Laterality: N/A;  . Colonoscopy w/ polypectomy  2011    Dr Benson Norway    Family History: Family History  Problem Relation Age of Onset  . Stroke Brother   . Stroke Mother     Social History: Social History   Social History  . Marital Status: Single     Spouse Name: N/A  . Number of Children: 3  . Years of Education: N/A   Social History Main Topics  . Smoking status: Former Research scientist (life sciences)  . Smokeless tobacco: Never Used     Comment: remote history of tobacco abuse  . Alcohol Use: No  . Drug Use: No  . Sexual Activity: Not Currently    Birth Control/ Protection: Surgical   Other Topics Concern  . None   Social History Narrative   Lives with spouse who was recently diagnosed with esophageal ca and is receiving  chemotherapy    Allergies:  No Known Allergies  Objective:    Vital Signs:   Temp:  [97.5 F (36.4 C)] 97.5 F (36.4 C) (12/04 1757) Pulse Rate:  [63-67] 63 (12/04 1945) SpO2:  [87 %-100 %] 87 % (12/04 1957) Weight:  [76.658 kg (169 lb)] 76.658 kg (169 lb) (12/04 1757)   Filed Weights   08/06/15 1757  Weight: 76.658 kg (169 lb)    Mean arterial Pressure 104 (during pain)  Physical Exam: General:  Well appearing. No resp difficulty HEENT: normal Neck: supple. JVP flat . Carotids 2+ bilat; no bruits. No lymphadenopathy or thryomegaly appreciated. Cor: Mechanical heart sounds with LVAD hum present. Lungs: clear Abdomen: soft, nondistended. No hepatosplenomegaly. No bruits or masses. Good bowel sounds. Driveline: Tender at exit site. No drainage or erythema. C/D/I; securement device intact and driveline incorporated Extremities: no cyanosis, clubbing, rash, edema Neuro: alert & orientedx3, cranial nerves grossly intact. moves all 4 extremities w/o difficulty. Affect pleasant  Telemetry: Sinus rhythm   Labs: Basic Metabolic Panel:  Recent Labs Lab 08/06/15 1821  NA 140  K 3.1*  CL 106  GLUCOSE 137*  BUN 22*  CREATININE 1.80*    Liver Function Tests: No results for input(s): AST, ALT, ALKPHOS, BILITOT, PROT, ALBUMIN in the last 168 hours. No results for input(s): LIPASE, AMYLASE in the last 168 hours. No results for input(s): AMMONIA in the last 168 hours.  CBC:  Recent Labs Lab 08/01/15 1157  08/06/15 1810 08/06/15 1821  WBC 6.5 8.2  --   NEUTROABS 4.0 5.3  --   HGB 8.2 Repeated and verified X2.* 8.1* 10.2*  HCT 26.5 Repeated and verified X2.* 26.4* 30.0*  MCV 75.7* 76.7*  --   PLT 272.0 245  --     Cardiac Enzymes: No results for input(s): CKTOTAL, CKMB, CKMBINDEX, TROPONINI in the last 168 hours.  BNP: BNP (last 3 results)  Recent Labs  01/10/15 1000 05/11/15 1153 07/11/15 1005  BNP 162.1* 214.5* 276.6*    ProBNP (last 3 results) No results for input(s): PROBNP in the last 8760 hours.   CBG: No results for input(s): GLUCAP in the last 168 hours.  Coagulation Studies:  Recent Labs  08/06/15 1810  LABPROT 27.0*  INR 2.54*    Other results:   Imaging: Ct Abdomen Pelvis W Contrast  08/06/2015  CLINICAL DATA:  Sudden onset of lower abdominal pain and vomiting today. Patient has a left ventricular assist device. EXAM: CT ABDOMEN AND PELVIS WITH CONTRAST TECHNIQUE: Multidetector CT imaging of the abdomen and pelvis was performed using the standard protocol following bolus administration of intravenous contrast. CONTRAST:  5mL OMNIPAQUE IOHEXOL 300 MG/ML  SOLN COMPARISON:  CT scan 04/18/2013 FINDINGS: Lower chest: The lung bases demonstrate dependent vascular crowding and atelectasis. No definite infiltrates. The heart is enlarged. A left ventricular assist device is again noted with moderate artifact. No complicating features are demonstrated. Hepatobiliary: Small calcified granuloma and small hepatic cysts noted. No worrisome lesions. The gallbladder demonstrates dense layering gallstones and mild distension. No inflammation. No common bile duct dilatation. Pancreas: No mass, inflammation or ductal dilatation. Spleen: Normal size.  No focal lesions. Adrenals/Urinary Tract: The adrenal glands are normal. Small renal cysts but no worrisome renal lesions or hydronephrosis. Stomach/Bowel: The stomach, duodenum, small bowel and colon are grossly normal. No  inflammatory changes, mass lesions or obstructive findings. Scattered colonic diverticulosis without findings for acute diverticulitis. Vascular/Lymphatic: No mesenteric or retroperitoneal mass or adenopathy. The aorta is normal in caliber.  Moderate scattered atherosclerotic calcifications. The branch vessels are patent. The major venous structures are patent. Reproductive: The uterus is surgically absent. The ovaries are not identified for certain. Other: No pelvic mass or adenopathy. No free pelvic fluid collections. No inguinal mass or adenopathy. No abdominal wall hernia or subcutaneous lesions. Musculoskeletal: Degenerative changes involving the lumbar spine but no acute bony abnormality. IMPRESSION: 1. No acute abdominal/pelvic findings, mass lesions or adenopathy. 2. Cholelithiasis.  No definite CT findings for acute cholecystitis. 3. Left ventricular assist device without complicating features. Electronically Signed   By: Marijo Sanes M.D.   On: 08/06/2015 19:22   Dg Chest Portable 1 View  08/06/2015  CLINICAL DATA:  Abdominal pain and vomiting. EXAM: PORTABLE CHEST 1 VIEW COMPARISON:  11/09/2014 FINDINGS: Left ventricular assist device is stable. Dual lead cardiac pacemaker is also in stable position. Median sternotomy wires are intact. Cardiomediastinal silhouette is mildly enlarged. Mediastinal contours appear intact. There is no evidence of focal airspace consolidation, pleural effusion or pneumothorax. There is mild prominence of the interstitial markings. Osseous structures are without acute abnormality. Soft tissues are grossly normal. IMPRESSION: Mild prominence of the interstitial markings which may be seen with pulmonary vascular congestion. Mild cardiomegaly. Electronically Signed   By: Fidela Salisbury M.D.   On: 08/06/2015 19:00         Assessment:   1. Ab pain and nausea 2. Chronic systolic HF s/p HM II VAD 3. Acute on chronic renal failure, stage III-IV 4. HTN 5. PAF in  NSR on amiodarone 6. Chronic anemia, iron deficient 7. Hypokalemia  Plan/Discussion:    She is very tender along driveline exit site. The site looks good without any erythema or exudate. CT abdomen and pelvis without any concerning findings. Suspect driveline trauma. Will admit for observation and pain control. Creatinine up slightly from baseline and she did receive contrast so will hydrate. Hold torsemide, spiro and losartan. Follow labs. Check UA. Fentanyl for pain control. Supp K+.    I reviewed the LVAD parameters from today, and compared the results to the patient's prior recorded data.  No programming changes were made.  The LVAD is functioning within specified parameters.  The patient performs LVAD self-test daily.  LVAD interrogation was negative for any significant power changes, alarms or PI events/speed drops.  LVAD equipment check completed and is in good working order.  Back-up equipment present.   LVAD education done on emergency procedures and precautions and reviewed exit site care.  Length of Stay:   Glori Bickers 08/06/2015, 8:04 PM  VAD Team Pager [phone] (7am - 7am) +++VAD ISSUES ONLY+++   Advanced Heart Failure Team Pager [phone] (M-F; 7a - 4p)  Please contact [facility] for night-coverage after hours (4p -7a ) and weekends on [email or web address] for all non- LVAD Issues

## 2015-08-06 NOTE — ED Notes (Signed)
Pt arrivers from home via [facility] with LVAD c/o sudden onset abdominal pain with n/v.  Pt reports symptoms began around 1645, reports vomiting 3 times.  Pt reports lower abdominal pain.  Pt moaning, appears to be in distress.  Rapid response notified [id number].

## 2015-08-06 NOTE — ED Provider Notes (Signed)
CSN: [id number]     Arrival date & time 08/06/15  1751 History   First MD Initiated Contact with Patient 08/06/15 1811     Chief Complaint  Patient presents with  . Abdominal Pain  . Emesis     (Consider location/radiation/quality/duration/timing/severity/associated sxs/prior Treatment) The history is provided by the patient and the spouse.     Brianna Hanson is a 80 y.o. female who presents for evaluation of lower abdominal pain which started about 3 hours ago, and has gradually worsened. This pain occurred after she was setting up her Christmas tree, and titrating it. She denies weakness, dizziness, back pain, diarrhea or constipation. She had a bowel movement, today. She's had 3 episodes of vomiting and continues to be nauseated. No recent illnesses and no recent complications with her LVAD. She ate normally over the weekend. There are no other known modifying factors.   Past Medical History  Diagnosis Date  . Cardiac arrest - ventricular fibrillation 12/10    with successful resucitation, S/p ICD  . ICD (implantable cardiac defibrillator) in place     she has received appropriate therapy for VF  . Atrial fibrillation or flutter   . Osteopenia   . HTN (hypertension)     moderate  . Nonischemic cardiomyopathy ([place])     follow[facility] Dr April Holding at [facility]  . CHF (congestive heart failure) ([place])   . Anemia   . Plasma cell disorder 03/20/2012  . Plasma cell disorder 03/20/2012  . Sessile colonic polyp 2011    Dr Benson Norway  . Diverticula of colon 2011  . Internal and external hemorrhoids without complication [id number]   Past Surgical History  Procedure Laterality Date  . Abdominal hysterectomy    . Cardiac catheterization    . Cardiac defibrillator placement      by JA for secondary prevention of sudden death  . Insertion of implantable left ventricular assist device N/A 04/06/2013    Procedure: INSERTION OF IMPLANTABLE LEFT VENTRICULAR ASSIST DEVICE;  Surgeon: Gaye Pollack, MD;   Location: [place];  Service: Open Heart Surgery;  Laterality: N/A;  . Placement of centrimag ventricular assist device Right 04/06/2013    Procedure: PLACEMENT OF CENTRIMAG VENTRICULAR ASSIST DEVICE;  Surgeon: Gaye Pollack, MD;  Location: [place];  Service: Open Heart Surgery;  Laterality: Right;  . Intraoperative transesophageal echocardiogram N/A 04/06/2013    Procedure: INTRAOPERATIVE TRANSESOPHAGEAL ECHOCARDIOGRAM;  Surgeon: Gaye Pollack, MD;  Location: [facility] OR;  Service: Open Heart Surgery;  Laterality: N/A;  . Colonoscopy w/ polypectomy  2011    Dr Benson Norway   Family History  Problem Relation Age of Onset  . Stroke Brother   . Stroke Mother    Social History  Substance Use Topics  . Smoking status: Never Smoker   . Smokeless tobacco: Never Used     Comment: remote history of tobacco abuse  . Alcohol Use: No     Comment: occsionally wine   OB History    No data available     Review of Systems  All other systems reviewed and are negative.     Allergies  Review of patient's allergies indicates no known allergies.  Home Medications   Prior to Admission medications   Medication Sig Start Date End Date Taking? Authorizing Provider  amiodarone (PACERONE) 200 MG tablet TAKE 1 TABLET BY MOUTH EVERY DAY 07/26/15  Yes Thompson Grayer, MD  amLODipine (NORVASC) 10 MG tablet TAKE 1 TABLET BY MOUTH TWICE A DAY 03/28/15  Yes  Jolaine Artist, MD  carvedilol (COREG) 12.5 MG tablet TAKE 1 & 1/2 TABS TWICE DAILY 05/15/15  Yes Jolaine Artist, MD  Cholecalciferol (VITAMIN D3) 1000 UNITS CAPS Take 1 tablet by mouth daily.     Yes Historical Provider, MD  docusate sodium (COLACE) 100 MG capsule Take 1 capsule (100 mg total) by mouth 2 (two) times daily. 07/11/15  Yes Jolaine Artist, MD  ferrous sulfate 325 (65 FE) MG tablet Take 1 tablet (325 mg total) by mouth 2 (two) times daily with a meal. 07/11/15  Yes Jolaine Artist, MD  gabapentin (NEURONTIN) 100 MG capsule TAKE 1 CAPSULE BY MOUTH AT  BEDTIME OR AS DIRECTED 02/07/15  Yes Amy D Clegg, NP  hydrALAZINE (APRESOLINE) 100 MG tablet TAKE 1 TABLET BY MOUTH 3 TIMES A DAY 05/04/15  Yes Shaune Pascal [name], MD  losartan (COZAAR) 50 MG tablet TAKE 1 TABLET BY MOUTH EVERY DAY 05/15/15  Yes Amy D Clegg, NP  pantoprazole (PROTONIX) 40 MG tablet TAKE 1 TABLET BY MOUTH EVERY DAY 07/25/15  Yes Jolaine Artist, MD  spironolactone (ALDACTONE) 25 MG tablet TAKE 1 TABLET BY MOUTH EVERY DAY 07/25/15  Yes Jolaine Artist, MD  torsemide (DEMADEX) 20 MG tablet Take 1 tablet (20 mg total) by mouth daily as needed. weight gain 3 lbs in 24 hours 05/11/15  Yes Jolaine Artist, MD  traMADol (ULTRAM) 50 MG tablet Take 1 tablet (50 mg total) by mouth every 6 (six) hours as needed for moderate pain. for pain 05/11/15  Yes Jolaine Artist, MD  warfarin (COUMADIN) 2.5 MG tablet Take 2.5-5 mg by mouth daily. 2.5mg  daily except 5mg  on Wednesdays   Yes Historical Provider, MD   BP 72/60 mmHg  Pulse 66  Temp(Src) 98.8 F (37.1 C) (Oral)  Resp 28  Ht 5' (1.524 m)  Wt 170 lb 3.1 oz (77.2 kg)  BMI 33.24 kg/m2  SpO2 94% Physical Exam  Constitutional: She is oriented to person, place, and time. She appears well-developed and well-nourished. She appears distressed (She is uncomfortable).  Elderly, frail  HENT:  Head: Normocephalic and atraumatic.  Right Ear: External ear normal.  Left Ear: External ear normal.  Eyes: Conjunctivae and EOM are normal. Pupils are equal, round, and reactive to light.  Neck: Normal range of motion and phonation normal. Neck supple.  Cardiovascular: Normal rate, regular rhythm and normal heart sounds.   Pulmonary/Chest: Effort normal and breath sounds normal. She exhibits no bony tenderness.  Abdominal: Soft. There is tenderness (mild mid to lower abdominal tenderness in the midline. No associated rebound tenderness. No palpable mass or pulsation in the abdomen.).  Musculoskeletal: Normal range of motion. She exhibits edema (Legs,  bilaterally). She exhibits no tenderness.  Neurological: She is alert and oriented to person, place, and time. No cranial nerve deficit or sensory deficit. She exhibits normal muscle tone. Coordination normal.  Skin: Skin is warm, dry and intact.  Psychiatric: She has a normal mood and affect. Her behavior is normal. Judgment and thought content normal.  Nursing note and vitals reviewed.   ED Course  Procedures (including critical care time)  Medications  pantoprazole (PROTONIX) EC tablet 40 mg (40 mg Oral Given 08/07/15 1008)  docusate sodium (COLACE) capsule 100 mg (100 mg Oral Given 08/07/15 1008)  ferrous sulfate tablet 325 mg (325 mg Oral Given 08/07/15 1740)  carvedilol (COREG) tablet 18.75 mg (18.75 mg Oral Not Given 08/07/15 1803)  gabapentin (NEURONTIN) capsule 100 mg (100 mg Oral  Given 08/06/15 2207)  amLODipine (NORVASC) tablet 10 mg (10 mg Oral Given 08/07/15 1008)  hydrALAZINE (APRESOLINE) tablet 100 mg (100 mg Oral Not Given 08/07/15 1803)  acetaminophen (TYLENOL) tablet 650 mg (650 mg Oral Given 08/07/15 0859)    Or  acetaminophen (TYLENOL) suppository 650 mg ( Rectal See Alternative 08/07/15 0859)  fentaNYL (SUBLIMAZE) injection 25-50 mcg (50 mcg Intravenous Given 08/07/15 0944)  ondansetron (ZOFRAN) injection 4 mg (4 mg Intravenous Given 08/07/15 1505)  amiodarone (PACERONE) tablet 200 mg (200 mg Oral Given 08/07/15 1008)  antiseptic oral rinse (CPC / CETYLPYRIDINIUM CHLORIDE 0.05%) solution 7 mL (7 mLs Mouth Rinse Given 08/07/15 1013)  sodium chloride 0.9 % injection 10 mL (not administered)  vancomycin (VANCOCIN) IVPB 1000 mg/200 mL premix (1,000 mg Intravenous Given 08/07/15 1215)  Warfarin - Pharmacist Dosing Inpatient ( Does not apply Given 08/07/15 1741)  Influenza vac split quadrivalent PF (FLUARIX) injection 0.5 mL (not administered)  piperacillin-tazobactam (ZOSYN) IVPB 3.375 g (3.375 g Intravenous Given 08/07/15 1750)    Followed by  piperacillin-tazobactam (ZOSYN) IVPB  3.375 g (not administered)  HYDROmorphone (DILAUDID) injection 1 mg (1 mg Intravenous Given 08/06/15 1822)  ondansetron (ZOFRAN) injection 4 mg (4 mg Intravenous Given 08/06/15 1819)  iohexol (OMNIPAQUE) 300 MG/ML solution 100 mL (80 mLs Intravenous Contrast Given 08/06/15 1854)  HYDROmorphone (DILAUDID) injection 0.5 mg (0.5 mg Intravenous Given 08/06/15 1942)  sodium chloride 0.9 % bolus 1,000 mL (1,000 mLs Intravenous New Bag/Given 08/06/15 1953)  carvedilol (COREG) tablet 18.75 mg (18.75 mg Oral Given 08/06/15 2207)  warfarin (COUMADIN) tablet 2.5 mg (2.5 mg Oral Given 08/07/15 1740)    Patient Vitals for the past 24 hrs:  BP Temp Temp src Pulse Resp SpO2 Height Weight  08/07/15 2000 (!) 72/60 mmHg - - - - - - -  08/07/15 1945 - 98.8 F (37.1 C) Oral - - - - -  08/07/15 1730 (!) 74/52 mmHg - - - (!) 28 94 % - -  08/07/15 1610 (!) 85/44 mmHg 100.2 F (37.9 C) Oral 66 (!) 27 93 % - -  08/07/15 1400 - - - - (!) 22 96 % - -  08/07/15 1200 - - - - (!) 23 96 % - -  08/07/15 1145 (!) 77/62 mmHg - - - - - - -  08/07/15 1143 (!) 72/65 mmHg (!) 100.5 F (38.1 C) Oral 74 (!) 74 95 % - -  08/07/15 1100 - - - - (!) 22 96 % - -  08/07/15 1008 102/70 mmHg - - - - - - -  08/07/15 1000 - - - - (!) 22 96 % - -  08/07/15 0949 102/70 mmHg - - 80 - - - -  08/07/15 0915 102/71 mmHg - - - - - - -  08/07/15 0800 - - - - (!) 27 94 % - -  08/07/15 0752 - (!) 102 F (38.9 C) Oral - - - - -  08/07/15 [id number] - - - - - - - 170 lb 3.1 oz (77.2 kg)  08/07/15 0400 100/74 mmHg 99.5 F (37.5 C) Oral 70 20 93 % - -  08/07/15 0000 109/70 mmHg - - 79 (!) 23 96 % - -  08/06/15 2329 107/79 mmHg 97.6 F (36.4 C) Oral - 14 96 % - -  08/06/15 2206 108/80 mmHg - - - - - - -  08/06/15 2115 105/76 mmHg 97.7 F (36.5 C) Oral 66 16 98 % 5' (1.524 m) 174 lb 2.6 oz (79  kg)   Evaluation and care coordinated with Dr. Missy Sabins, on phone shortly after arrival, and in person in ED later.     Labs Review Labs Reviewed  CBC WITH  DIFFERENTIAL/PLATELET - Abnormal; Notable for the following:    RBC 3.44 (*)    Hemoglobin 8.1 (*)    HCT 26.4 (*)    MCV 76.7 (*)    MCH 23.5 (*)    RDW 26.5 (*)    All other components within normal limits  URINALYSIS, ROUTINE W REFLEX MICROSCOPIC (NOT AT [facility]) - Abnormal; Notable for the following:    Ketones, ur 15 (*)    All other components within normal limits  PROTIME-INR - Abnormal; Notable for the following:    Prothrombin Time 27.0 (*)    INR 2.54 (*)    All other components within normal limits  LACTATE DEHYDROGENASE - Abnormal; Notable for the following:    LDH 277 (*)    All other components within normal limits  LACTATE DEHYDROGENASE - Abnormal; Notable for the following:    LDH 288 (*)    All other components within normal limits  PROTIME-INR - Abnormal; Notable for the following:    Prothrombin Time 27.4 (*)    INR 2.59 (*)    All other components within normal limits  COMPREHENSIVE METABOLIC PANEL - Abnormal; Notable for the following:    Glucose, Bld 104 (*)    Creatinine, Ser 1.18 (*)    Calcium 8.4 (*)    Albumin 3.1 (*)    GFR calc non Af Amer 43 (*)    GFR calc Af Amer 50 (*)    All other components within normal limits  CBC WITH DIFFERENTIAL/PLATELET - Abnormal; Notable for the following:    RBC 3.26 (*)    Hemoglobin 7.6 (*)    HCT 25.1 (*)    MCV 77.0 (*)    MCH 23.3 (*)    RDW 26.6 (*)    All other components within normal limits  CBC WITH DIFFERENTIAL/PLATELET - Abnormal; Notable for the following:    RBC 3.34 (*)    Hemoglobin 7.9 (*)    HCT 26.2 (*)    MCH 23.7 (*)    RDW 26.5 (*)    All other components within normal limits  I-STAT CHEM 8, ED - Abnormal; Notable for the following:    Potassium 3.1 (*)    BUN 22 (*)    Creatinine, Ser 1.80 (*)    Glucose, Bld 137 (*)    Calcium, Ion 1.05 (*)    Hemoglobin 10.2 (*)    HCT 30.0 (*)    All other components within normal limits  MRSA PCR SCREENING  CULTURE, BLOOD (ROUTINE X 2)   CULTURE, BLOOD (ROUTINE X 2)  LACTATE DEHYDROGENASE  PROTIME-INR  COMPREHENSIVE METABOLIC PANEL  CBC WITH DIFFERENTIAL/PLATELET  I-STAT TROPOININ, ED    Imaging Review Ct Abdomen Pelvis W Contrast  08/06/2015  CLINICAL DATA:  Sudden onset of lower abdominal pain and vomiting today. Patient has a left ventricular assist device. EXAM: CT ABDOMEN AND PELVIS WITH CONTRAST TECHNIQUE: Multidetector CT imaging of the abdomen and pelvis was performed using the standard protocol following bolus administration of intravenous contrast. CONTRAST:  34mL OMNIPAQUE IOHEXOL 300 MG/ML  SOLN COMPARISON:  CT scan 04/18/2013 FINDINGS: Lower chest: The lung bases demonstrate dependent vascular crowding and atelectasis. No definite infiltrates. The heart is enlarged. A left ventricular assist device is again noted with moderate artifact. No complicating features are demonstrated.  Hepatobiliary: Small calcified granuloma and small hepatic cysts noted. No worrisome lesions. The gallbladder demonstrates dense layering gallstones and mild distension. No inflammation. No common bile duct dilatation. Pancreas: No mass, inflammation or ductal dilatation. Spleen: Normal size.  No focal lesions. Adrenals/Urinary Tract: The adrenal glands are normal. Small renal cysts but no worrisome renal lesions or hydronephrosis. Stomach/Bowel: The stomach, duodenum, small bowel and colon are grossly normal. No inflammatory changes, mass lesions or obstructive findings. Scattered colonic diverticulosis without findings for acute diverticulitis. Vascular/Lymphatic: No mesenteric or retroperitoneal mass or adenopathy. The aorta is normal in caliber. Moderate scattered atherosclerotic calcifications. The branch vessels are patent. The major venous structures are patent. Reproductive: The uterus is surgically absent. The ovaries are not identified for certain. Other: No pelvic mass or adenopathy. No free pelvic fluid collections. No inguinal mass or  adenopathy. No abdominal wall hernia or subcutaneous lesions. Musculoskeletal: Degenerative changes involving the lumbar spine but no acute bony abnormality. IMPRESSION: 1. No acute abdominal/pelvic findings, mass lesions or adenopathy. 2. Cholelithiasis.  No definite CT findings for acute cholecystitis. 3. Left ventricular assist device without complicating features. Electronically Signed   By: Marijo Sanes M.D.   On: 08/06/2015 19:22   Dg Chest Portable 1 View  08/06/2015  CLINICAL DATA:  Abdominal pain and vomiting. EXAM: PORTABLE CHEST 1 VIEW COMPARISON:  11/09/2014 FINDINGS: Left ventricular assist device is stable. Dual lead cardiac pacemaker is also in stable position. Median sternotomy wires are intact. Cardiomediastinal silhouette is mildly enlarged. Mediastinal contours appear intact. There is no evidence of focal airspace consolidation, pleural effusion or pneumothorax. There is mild prominence of the interstitial markings. Osseous structures are without acute abnormality. Soft tissues are grossly normal. IMPRESSION: Mild prominence of the interstitial markings which may be seen with pulmonary vascular congestion. Mild cardiomegaly. Electronically Signed   By: Fidela Salisbury M.D.   On: 08/06/2015 19:00   I have personally reviewed and evaluated these images and lab results as part of my medical decision-making.   EKG Interpretation None      MDM   Final diagnoses:  Generalized abdominal pain    Nonspecific pain, with LVAD in place. No clear clinical or imaging source for pain. She will need further evaluation and treatment in the hospital.  Nursing Notes Reviewed/ Care Coordinated Applicable Imaging Reviewed Interpretation of Laboratory Data incorporated into ED treatment  Plan: Admit    Daleen Bo, MD 08/07/15 2033

## 2015-08-07 DIAGNOSIS — I959 Hypotension, unspecified: Secondary | ICD-10-CM | POA: Diagnosis not present

## 2015-08-07 DIAGNOSIS — R945 Abnormal results of liver function studies: Secondary | ICD-10-CM | POA: Diagnosis not present

## 2015-08-07 DIAGNOSIS — Z90710 Acquired absence of both cervix and uterus: Secondary | ICD-10-CM | POA: Diagnosis not present

## 2015-08-07 DIAGNOSIS — D472 Monoclonal gammopathy: Secondary | ICD-10-CM | POA: Diagnosis present

## 2015-08-07 DIAGNOSIS — I130 Hypertensive heart and chronic kidney disease with heart failure and stage 1 through stage 4 chronic kidney disease, or unspecified chronic kidney disease: Secondary | ICD-10-CM | POA: Diagnosis present

## 2015-08-07 DIAGNOSIS — Z95810 Presence of automatic (implantable) cardiac defibrillator: Secondary | ICD-10-CM | POA: Diagnosis not present

## 2015-08-07 DIAGNOSIS — R0602 Shortness of breath: Secondary | ICD-10-CM | POA: Diagnosis not present

## 2015-08-07 DIAGNOSIS — K8020 Calculus of gallbladder without cholecystitis without obstruction: Secondary | ICD-10-CM | POA: Diagnosis not present

## 2015-08-07 DIAGNOSIS — T829XXA Unspecified complication of cardiac and vascular prosthetic device, implant and graft, initial encounter: Secondary | ICD-10-CM | POA: Diagnosis not present

## 2015-08-07 DIAGNOSIS — N179 Acute kidney failure, unspecified: Secondary | ICD-10-CM | POA: Diagnosis present

## 2015-08-07 DIAGNOSIS — K819 Cholecystitis, unspecified: Secondary | ICD-10-CM | POA: Diagnosis not present

## 2015-08-07 DIAGNOSIS — R112 Nausea with vomiting, unspecified: Secondary | ICD-10-CM | POA: Diagnosis not present

## 2015-08-07 DIAGNOSIS — I509 Heart failure, unspecified: Secondary | ICD-10-CM | POA: Diagnosis not present

## 2015-08-07 DIAGNOSIS — I10 Essential (primary) hypertension: Secondary | ICD-10-CM | POA: Diagnosis not present

## 2015-08-07 DIAGNOSIS — I429 Cardiomyopathy, unspecified: Secondary | ICD-10-CM | POA: Diagnosis present

## 2015-08-07 DIAGNOSIS — Z87891 Personal history of nicotine dependence: Secondary | ICD-10-CM | POA: Diagnosis not present

## 2015-08-07 DIAGNOSIS — N183 Chronic kidney disease, stage 3 (moderate): Secondary | ICD-10-CM | POA: Diagnosis present

## 2015-08-07 DIAGNOSIS — I9589 Other hypotension: Secondary | ICD-10-CM | POA: Diagnosis not present

## 2015-08-07 DIAGNOSIS — I5023 Acute on chronic systolic (congestive) heart failure: Secondary | ICD-10-CM | POA: Diagnosis not present

## 2015-08-07 DIAGNOSIS — K810 Acute cholecystitis: Secondary | ICD-10-CM | POA: Diagnosis not present

## 2015-08-07 DIAGNOSIS — M8580 Other specified disorders of bone density and structure, unspecified site: Secondary | ICD-10-CM | POA: Diagnosis present

## 2015-08-07 DIAGNOSIS — Z7901 Long term (current) use of anticoagulants: Secondary | ICD-10-CM | POA: Diagnosis not present

## 2015-08-07 DIAGNOSIS — R1011 Right upper quadrant pain: Secondary | ICD-10-CM | POA: Diagnosis not present

## 2015-08-07 DIAGNOSIS — J9811 Atelectasis: Secondary | ICD-10-CM | POA: Diagnosis not present

## 2015-08-07 DIAGNOSIS — I480 Paroxysmal atrial fibrillation: Secondary | ICD-10-CM | POA: Diagnosis not present

## 2015-08-07 DIAGNOSIS — R1031 Right lower quadrant pain: Secondary | ICD-10-CM | POA: Diagnosis not present

## 2015-08-07 DIAGNOSIS — R1084 Generalized abdominal pain: Secondary | ICD-10-CM | POA: Diagnosis not present

## 2015-08-07 DIAGNOSIS — Z95811 Presence of heart assist device: Secondary | ICD-10-CM | POA: Diagnosis not present

## 2015-08-07 DIAGNOSIS — K812 Acute cholecystitis with chronic cholecystitis: Secondary | ICD-10-CM | POA: Diagnosis not present

## 2015-08-07 DIAGNOSIS — Z79899 Other long term (current) drug therapy: Secondary | ICD-10-CM | POA: Diagnosis not present

## 2015-08-07 DIAGNOSIS — I4901 Ventricular fibrillation: Secondary | ICD-10-CM | POA: Diagnosis not present

## 2015-08-07 DIAGNOSIS — I5022 Chronic systolic (congestive) heart failure: Secondary | ICD-10-CM | POA: Diagnosis not present

## 2015-08-07 DIAGNOSIS — R109 Unspecified abdominal pain: Secondary | ICD-10-CM | POA: Diagnosis not present

## 2015-08-07 DIAGNOSIS — E876 Hypokalemia: Secondary | ICD-10-CM | POA: Diagnosis present

## 2015-08-07 DIAGNOSIS — D500 Iron deficiency anemia secondary to blood loss (chronic): Secondary | ICD-10-CM | POA: Diagnosis present

## 2015-08-07 DIAGNOSIS — K5730 Diverticulosis of large intestine without perforation or abscess without bleeding: Secondary | ICD-10-CM | POA: Diagnosis not present

## 2015-08-07 DIAGNOSIS — Z8674 Personal history of sudden cardiac arrest: Secondary | ICD-10-CM | POA: Diagnosis not present

## 2015-08-07 DIAGNOSIS — L089 Local infection of the skin and subcutaneous tissue, unspecified: Secondary | ICD-10-CM

## 2015-08-07 DIAGNOSIS — D62 Acute posthemorrhagic anemia: Secondary | ICD-10-CM | POA: Diagnosis not present

## 2015-08-07 DIAGNOSIS — Z452 Encounter for adjustment and management of vascular access device: Secondary | ICD-10-CM | POA: Diagnosis not present

## 2015-08-07 DIAGNOSIS — T148 Other injury of unspecified body region: Secondary | ICD-10-CM

## 2015-08-07 LAB — CBC WITH DIFFERENTIAL/PLATELET
Basophils Absolute: 0 10*3/uL (ref 0.0–0.1)
Basophils Relative: 0 %
Eosinophils Absolute: 0 10*3/uL (ref 0.0–0.7)
Eosinophils Relative: 1 %
HCT: 26.2 % — ABNORMAL LOW (ref 36.0–46.0)
Hemoglobin: 7.9 g/dL — ABNORMAL LOW (ref 12.0–15.0)
Lymphocytes Relative: 10 %
Lymphs Abs: 0.9 10*3/uL (ref 0.7–4.0)
MCH: 23.7 pg — ABNORMAL LOW (ref 26.0–34.0)
MCHC: 30.2 g/dL (ref 30.0–36.0)
MCV: 78.4 fL (ref 78.0–100.0)
Monocytes Absolute: 0.7 10*3/uL (ref 0.1–1.0)
Monocytes Relative: 8 %
Neutro Abs: 6.8 10*3/uL (ref 1.7–7.7)
Neutrophils Relative %: 81 %
Platelets: 223 10*3/uL (ref 150–400)
RBC: 3.34 MIL/uL — ABNORMAL LOW (ref 3.87–5.11)
RDW: 26.5 % — ABNORMAL HIGH (ref 11.5–15.5)
WBC: 8.4 10*3/uL (ref 4.0–10.5)

## 2015-08-07 LAB — COMPREHENSIVE METABOLIC PANEL
ALT: 15 U/L (ref 14–54)
AST: 27 U/L (ref 15–41)
Albumin: 3.1 g/dL — ABNORMAL LOW (ref 3.5–5.0)
Alkaline Phosphatase: 73 U/L (ref 38–126)
Anion gap: 7 (ref 5–15)
BUN: 14 mg/dL (ref 6–20)
CO2: 24 mmol/L (ref 22–32)
Calcium: 8.4 mg/dL — ABNORMAL LOW (ref 8.9–10.3)
Chloride: 107 mmol/L (ref 101–111)
Creatinine, Ser: 1.18 mg/dL — ABNORMAL HIGH (ref 0.44–1.00)
GFR calc Af Amer: 50 mL/min — ABNORMAL LOW (ref >60)
GFR calc non Af Amer: 43 mL/min — ABNORMAL LOW (ref >60)
Glucose, Bld: 104 mg/dL — ABNORMAL HIGH (ref 65–99)
Potassium: 4.2 mmol/L (ref 3.5–5.1)
Sodium: 138 mmol/L (ref 135–145)
Total Bilirubin: 0.6 mg/dL (ref 0.3–1.2)
Total Protein: 7.6 g/dL (ref 6.5–8.1)

## 2015-08-07 LAB — URINALYSIS, ROUTINE W REFLEX MICROSCOPIC
Bilirubin Urine: NEGATIVE
Glucose, UA: NEGATIVE mg/dL
Hgb urine dipstick: NEGATIVE
Ketones, ur: 15 mg/dL — AB
Leukocytes, UA: NEGATIVE
Nitrite: NEGATIVE
Protein, ur: NEGATIVE mg/dL
Specific Gravity, Urine: 1.023 (ref 1.005–1.030)
pH: 6.5 (ref 5.0–8.0)

## 2015-08-07 LAB — LACTATE DEHYDROGENASE: LDH: 288 U/L — ABNORMAL HIGH (ref 98–192)

## 2015-08-07 LAB — PROTIME-INR
INR: 2.59 — ABNORMAL HIGH (ref 0.00–1.49)
Prothrombin Time: 27.4 seconds — ABNORMAL HIGH (ref 11.6–15.2)

## 2015-08-07 LAB — MRSA PCR SCREENING: MRSA by PCR: NEGATIVE

## 2015-08-07 MED ORDER — INFLUENZA VAC SPLIT QUAD 0.5 ML IM SUSY: 0.5000 mL | PREFILLED_SYRINGE | INTRAMUSCULAR | Status: AC | PRN

## 2015-08-07 MED ORDER — VANCOMYCIN HCL IN DEXTROSE 1-5 GM/200ML-% IV SOLN
1000.0000 mg | INTRAVENOUS | Status: DC
  Administered 2015-08-07 – 2015-08-09 (×3): 1000 mg via INTRAVENOUS
  Filled 2015-08-07 (×4): qty 200

## 2015-08-07 MED ORDER — WARFARIN SODIUM 2.5 MG PO TABS
2.5000 mg | ORAL_TABLET | Freq: Once | ORAL | Status: AC
  Administered 2015-08-07: 2.5 mg via ORAL
  Filled 2015-08-07: qty 1

## 2015-08-07 MED ORDER — PIPERACILLIN-TAZOBACTAM 3.375 G IVPB 30 MIN
3.3750 g | Freq: Once | INTRAVENOUS | Status: AC
  Administered 2015-08-07: 3.375 g via INTRAVENOUS
  Filled 2015-08-07: qty 50

## 2015-08-07 MED ORDER — PIPERACILLIN-TAZOBACTAM 3.375 G IVPB
3.3750 g | Freq: Three times a day (TID) | INTRAVENOUS | Status: DC
  Administered 2015-08-08 – 2015-08-14 (×20): 3.375 g via INTRAVENOUS
  Filled 2015-08-07 (×21): qty 50

## 2015-08-07 MED ORDER — SODIUM CHLORIDE 0.9 % IJ SOLN
10.0000 mL | INTRAMUSCULAR | Status: DC | PRN
  Administered 2015-08-18: 10 mL via INTRAVENOUS
  Filled 2015-08-07: qty 10

## 2015-08-07 MED ORDER — WARFARIN - PHARMACIST DOSING INPATIENT
Freq: Every day | Status: DC
  Administered 2015-08-07: 18:00:00

## 2015-08-07 MED ORDER — SODIUM CHLORIDE 0.9 % IV BOLUS (SEPSIS)
250.0000 mL | Freq: Once | INTRAVENOUS | Status: AC
  Administered 2015-08-07: 250 mL via INTRAVENOUS

## 2015-08-07 MED ORDER — CETYLPYRIDINIUM CHLORIDE 0.05 % MT LIQD
7.0000 mL | Freq: Two times a day (BID) | OROMUCOSAL | Status: DC
  Administered 2015-08-07 – 2015-08-17 (×13): 7 mL via OROMUCOSAL

## 2015-08-07 NOTE — Progress Notes (Signed)
ANTICOAGULATION CONSULT NOTE - Initial Consult  Pharmacy Consult for [facility]  Indication: LVAD   No Known Allergies  Patient Measurements: Height: 5' (152.4 cm) Weight: 170 lb 3.1 oz (77.2 kg) IBW/kg (Calculated) : 45.5   Vital Signs: Temp: 100.5 F (38.1 C) (12/05 1143) Temp Source: Oral (12/05 1143) BP: 77/62 mmHg (12/05 1145) Pulse Rate: 74 (12/05 1143)  Labs:  Recent Labs  08/06/15 1810 08/06/15 1821 08/06/15 2044 08/07/15 0307  HGB 8.1* 10.2* 7.6* 7.9*  HCT 26.4* 30.0* 25.1* 26.2*  PLT 245  --  221 223  LABPROT 27.0*  --   --  27.4*  INR 2.54*  --   --  2.59*  CREATININE  --  1.80*  --  1.18*    Estimated Creatinine Clearance: 36.1 mL/min (by C-G formula based on Cr of 1.18).   Medical History: Past Medical History  Diagnosis Date  . Cardiac arrest - ventricular fibrillation 12/10    with successful resucitation, S/p ICD  . ICD (implantable cardiac defibrillator) in place     she has received appropriate therapy for VF  . Atrial fibrillation or flutter   . Osteopenia   . HTN (hypertension)     moderate  . Nonischemic cardiomyopathy ([place])     followed by Dr April Holding at [facility]  . CHF (congestive heart failure) ([place])   . Anemia   . Plasma cell disorder 03/20/2012  . Plasma cell disorder 03/20/2012  . Sessile colonic polyp 2011    Dr Benson Norway  . Diverticula of colon 2011  . 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 [name] (follwoed q 6 months). Underwent implantation of the HeartMate II LVAD on 04/06/13 for DT. She is not on aspirin due to dizziness. Admitted for abrupt onset of severe RLQ ab pain today around 3p with nausea. Denies fevers, chills, diarrhea, trauma, dysuria, bleeding, etc. In ER very tender along drivelie exit site but site otherwise looked fine. WBC normal. Creatinine up slightly 1.5-> 1.8.  Hgb stable. CT scan of ab/pelvis normal - started on Keflex last pm. Spike fever this am 102 stop keflex, draw Bcx and start vancomycin INR 2.59 slightly > goal no bleeding noted despite low h/h will watch.  She did not take her warfarin PTA 12/4 and dose held last nite on admit give ab pain.   Abdominal CT neg restart warfarin - will restart home dose - suspect INR may fall after missed dose yesterday  Home dose Warfarin 2.5mg  daily / 5mg  Wed  Goal of Therapy:  INR 2-2.5 Monitor platelets by anticoagulation protocol: Yes   Plan:  Warfarin 2.5mg  x1 today Daily INR, CBC  Bonnita Nasuti Pharm.D. CPP, BCPS Clinical Pharmacist [phone] 08/07/2015 2:04 PM

## 2015-08-07 NOTE — Progress Notes (Signed)
  LVAD Exit Site:  VAD dressing removed and site care performed using sterile technique. Drive line exit site cleaned with Chlora prep applicators x 2, allowed to dry, and Sorbaview dressing with biopatch re-applied. Exit site well healed and incorporated. The velour is fully implanted at exit site. No redness, drainage, or foul odor noted.Pt with tenderness at exit site and along internal drive line.  Drive line anchor intact and accurately applied. Driveline dressing is being changed weekly per sterile technique.   [name] and Dr. Cyndia Bent at bedside and evaluated exit site and abdominal tenderness; CT scan reviewed by both. Plan to add Zosyn; blood cultures drawn earlier today.

## 2015-08-07 NOTE — Progress Notes (Signed)
ANTIBIOTIC CONSULT NOTE - INITIAL  Pharmacy Consult for Vancomycin  Indication: LVAD - drive line infection  No Known Allergies  Patient Measurements: Height: 5' (152.4 cm) Weight: 170 lb 3.1 oz (77.2 kg) IBW/kg (Calculated) : 45.5   Vital Signs: Temp: 100.5 F (38.1 C) (12/05 1143) Temp Source: Oral (12/05 1143) BP: 77/62 mmHg (12/05 1145) Pulse Rate: 74 (12/05 1143) Intake/Output from previous day: 12/04 0701 - 12/05 0700 In: 786.3 [P.O.:120; I.V.:666.3] Out: 700 [Urine:700] Intake/Output from this shift: Total I/O In: 320 [P.O.:120; IV Piggyback:200] Out: 825 [Urine:575; Emesis/NG output:250]  Labs:  Recent Labs  08/06/15 1810 08/06/15 1821 08/06/15 2044 08/07/15 0307  WBC 8.2  --  7.9 8.4  HGB 8.1* 10.2* 7.6* 7.9*  PLT 245  --  221 223  CREATININE  --  1.80*  --  1.18*   Estimated Creatinine Clearance: 36.1 mL/min (by C-G formula based on Cr of 1.18). No results for input(s): VANCOTROUGH, VANCOPEAK, VANCORANDOM, GENTTROUGH, GENTPEAK, GENTRANDOM, TOBRATROUGH, TOBRAPEAK, TOBRARND, AMIKACINPEAK, AMIKACINTROU, AMIKACIN in the last 72 hours.   Microbiology: Recent Results (from the past 720 hour(s))  MRSA PCR Screening     Status: None   Collection Time: 08/06/15  9:29 PM  Result Value Ref Range Status   MRSA by PCR NEGATIVE NEGATIVE Final    Comment:        The GeneXpert MRSA Assay (FDA approved for NASAL specimens only), is one component of a comprehensive MRSA colonization surveillance program. It is not intended to diagnose MRSA infection nor to guide or monitor treatment for MRSA infections.     Medical History: Past Medical History  Diagnosis Date  . Cardiac arrest - ventricular fibrillation 12/10    with successful resucitation, S/p ICD  . ICD (implantable cardiac defibrillator) in place     she has received appropriate therapy for VF  . Atrial fibrillation or flutter   . Osteopenia   . HTN (hypertension)     moderate  . Nonischemic  cardiomyopathy ([place])     followed by Dr April Holding at [facility]  . CHF (congestive heart failure) ([place])   . Anemia   . Plasma cell disorder 03/20/2012  . Plasma cell disorder 03/20/2012  . Sessile colonic polyp 2011    Dr Benson Norway  . Diverticula of colon 2011  . Internal and external hemorrhoids without complication [id number]     Assessment: 80yof Hx HF due to severe NICM, chronic systolic HF,PAF, plasma cell disorder (Likely IgA MGUS) - followed by Dr. [name] (follwoed q 6 months). Underwent implantation of the HeartMate II LVAD on 04/06/13 for DT. She is not on aspirin due to dizziness. Admitted for abrupt onset of severe RLQ ab pain today around 3p with nausea. Denies fevers, chills, diarrhea, trauma, dysuria, bleeding, etc. In ER very tender along drivelie exit site but site otherwise looked fine. WBC normal. Creatinine up slightly 1.5-> 1.8. Hgb stable. CT scan of ab/pelvis normal - started on Keflex last pm. Spike fever this am 102 stop keflex, draw Bcx and start vancomycin.  Goal of Therapy:  Vancomycin trough level 15-20 mcg/ml  Plan:  Vancomycin 1gm q24 VT at [facility] F/u Blood Cx  Bonnita Nasuti Pharm.D. CPP, BCPS Clinical Pharmacist [phone] 08/07/2015 1:25 PM

## 2015-08-07 NOTE — Progress Notes (Signed)
Dr Haroldine Laws paged and made aware of pt BP and MAP. Orders received to hold all antihypertensives for tonight. Will reassess in the am. Will continue to monitor closely.

## 2015-08-07 NOTE — Progress Notes (Signed)
Patient's MAP down to 65, her automatic BP reading at 72/60 (66). Pump Flow is 4.6. Patient is not feeling very well, she is nauseous and very weak, but no PI events noted on VAD. MD notified and came to assess to the bedside.

## 2015-08-07 NOTE — Progress Notes (Signed)
HeartMate 2 Rounding Note  Subjective:    Admitted with RLQ abdominal pain. Prior to admit she was putting up Christmas decorations. CT of abdomen negative. Creatinine was elevated so she was given IV fluids. Diuretics held. Todays creatinine is 1.18. UA negative.   Complaining of RUQ abdominal pain.   LVAD INTERROGATION:  HeartMate II LVAD:  Flow 5.2 liters/min, speed 9200 , power 5.5, PI 6.5 .  Rare PI events.   Objective:    Vital Signs:   Temp:  [97.5 F (36.4 C)-99.5 F (37.5 C)] 99.5 F (37.5 C) (12/05 0400) Pulse Rate:  [63-79] 70 (12/05 0400) Resp:  [14-23] 20 (12/05 0400) BP: (100-109)/(70-80) 100/74 mmHg (12/05 0400) SpO2:  [87 %-100 %] 93 % (12/05 0400) Weight:  [169 lb (76.658 kg)-174 lb 2.6 oz (79 kg)] 170 lb 3.1 oz (77.2 kg) (12/05 0452) Last BM Date: 08/06/15 Mean arterial Pressure 83   Intake/Output:   Intake/Output Summary (Last 24 hours) at 08/07/15 0725 Last data filed at 08/07/15 0700  Gross per 24 hour  Intake 786.25 ml  Output    700 ml  Net  86.25 ml     Physical Exam: General:  Lying in bed. Fatigued appearing. No resp difficulty HEENT: normal Neck: supple. JVP ~10 . Carotids 2+ bilat; no bruits. No lymphadenopathy or thryomegaly appreciated. Cor: Mechanical heart sounds with LVAD hum present. Irregular Lungs: clear Abdomen: soft, RUQ tender over driveline exit site. No erythema or drainage. , nondistended. No hepatosplenomegaly. No bruits or masses. Sluggish bowel sounds.  Driveline: C/D/I; securement device intact and driveline incorporated Extremities: no cyanosis, clubbing, rash, edema Neuro: alert & orientedx3, cranial nerves grossly intact. moves all 4 extremities w/o difficulty. Affect pleasant  Telemetry: NSR   Labs: Basic Metabolic Panel:  Recent Labs Lab 08/06/15 1821 08/07/15 0307  NA 140 138  K 3.1* 4.2  CL 106 107  CO2  --  24  GLUCOSE 137* 104*  BUN 22* 14  CREATININE 1.80* 1.18*  CALCIUM  --  8.4*    Liver  Function Tests:  Recent Labs Lab 08/07/15 0307  AST 27  ALT 15  ALKPHOS 73  BILITOT 0.6  PROT 7.6  ALBUMIN 3.1*   No results for input(s): LIPASE, AMYLASE in the last 168 hours. No results for input(s): AMMONIA in the last 168 hours.  CBC:  Recent Labs Lab 08/01/15 1157 08/06/15 1810 08/06/15 1821 08/06/15 2044 08/07/15 0307  WBC 6.5 8.2  --  7.9 8.4  NEUTROABS 4.0 5.3  --  6.3 6.8  HGB 8.2 Repeated and verified X2.* 8.1* 10.2* 7.6* 7.9*  HCT 26.5 Repeated and verified X2.* 26.4* 30.0* 25.1* 26.2*  MCV 75.7* 76.7*  --  77.0* 78.4  PLT 272.0 245  --  221 223    INR:  Recent Labs Lab 08/01/15 1157 08/01/15 1600 08/06/15 1810 08/07/15 0307  INR >10.0* 2.16* 2.54* 2.59*    Other results:    Imaging: Ct Abdomen Pelvis W Contrast  08/06/2015  CLINICAL DATA:  Sudden onset of lower abdominal pain and vomiting today. Patient has a left ventricular assist device. EXAM: CT ABDOMEN AND PELVIS WITH CONTRAST TECHNIQUE: Multidetector CT imaging of the abdomen and pelvis was performed using the standard protocol following bolus administration of intravenous contrast. CONTRAST:  68mL OMNIPAQUE IOHEXOL 300 MG/ML  SOLN COMPARISON:  CT scan 04/18/2013 FINDINGS: Lower chest: The lung bases demonstrate dependent vascular crowding and atelectasis. No definite infiltrates. The heart is enlarged. A left ventricular assist device is again  noted with moderate artifact. No complicating features are demonstrated. Hepatobiliary: Small calcified granuloma and small hepatic cysts noted. No worrisome lesions. The gallbladder demonstrates dense layering gallstones and mild distension. No inflammation. No common bile duct dilatation. Pancreas: No mass, inflammation or ductal dilatation. Spleen: Normal size.  No focal lesions. Adrenals/Urinary Tract: The adrenal glands are normal. Small renal cysts but no worrisome renal lesions or hydronephrosis. Stomach/Bowel: The stomach, duodenum, small bowel and  colon are grossly normal. No inflammatory changes, mass lesions or obstructive findings. Scattered colonic diverticulosis without findings for acute diverticulitis. Vascular/Lymphatic: No mesenteric or retroperitoneal mass or adenopathy. The aorta is normal in caliber. Moderate scattered atherosclerotic calcifications. The branch vessels are patent. The major venous structures are patent. Reproductive: The uterus is surgically absent. The ovaries are not identified for certain. Other: No pelvic mass or adenopathy. No free pelvic fluid collections. No inguinal mass or adenopathy. No abdominal wall hernia or subcutaneous lesions. Musculoskeletal: Degenerative changes involving the lumbar spine but no acute bony abnormality. IMPRESSION: 1. No acute abdominal/pelvic findings, mass lesions or adenopathy. 2. Cholelithiasis.  No definite CT findings for acute cholecystitis. 3. Left ventricular assist device without complicating features. Electronically Signed   By: Marijo Sanes M.D.   On: 08/06/2015 19:22   Dg Chest Portable 1 View  08/06/2015  CLINICAL DATA:  Abdominal pain and vomiting. EXAM: PORTABLE CHEST 1 VIEW COMPARISON:  11/09/2014 FINDINGS: Left ventricular assist device is stable. Dual lead cardiac pacemaker is also in stable position. Median sternotomy wires are intact. Cardiomediastinal silhouette is mildly enlarged. Mediastinal contours appear intact. There is no evidence of focal airspace consolidation, pleural effusion or pneumothorax. There is mild prominence of the interstitial markings. Osseous structures are without acute abnormality. Soft tissues are grossly normal. IMPRESSION: Mild prominence of the interstitial markings which may be seen with pulmonary vascular congestion. Mild cardiomegaly. Electronically Signed   By: Fidela Salisbury M.D.   On: 08/06/2015 19:00      Medications:     Scheduled Medications: . amiodarone  200 mg Oral Daily  . amLODipine  10 mg Oral BID  . antiseptic  oral rinse  7 mL Mouth Rinse BID  . carvedilol  18.75 mg Oral BID WC  . cephALEXin  500 mg Oral 4 times per day  . docusate sodium  100 mg Oral BID  . ferrous sulfate  325 mg Oral BID WC  . gabapentin  100 mg Oral QHS  . hydrALAZINE  100 mg Oral TID  . Influenza vac split quadrivalent PF  0.5 mL Intramuscular Tomorrow-1000  . pantoprazole  40 mg Oral Daily     Infusions: . 0.9 % NaCl with KCl 40 mEq / L 75 mL/hr (08/06/15 2207)     PRN Medications:  acetaminophen **OR** acetaminophen, fentaNYL (SUBLIMAZE) injection, ondansetron   Assessment:   1. Ab pain and nausea 2. Chronic systolic HF s/p HM II VAD 3. Acute on chronic renal failure, stage III-IV 4. HTN 5. PAF in NSR on amiodarone 6. Chronic anemia, iron deficient 7. Hypokalemia 8. Fever    Plan/Discussion:   Ongoing abdominal pain. CT negative. UA negative.  WBC ok . Now febrile temp 102. Check blood cultures x2.  Start vancomycin.   For now stop IV fluids.   Ask ST jude to interrrogate device.   I reviewed the LVAD parameters from today, and compared the results to the patient's prior recorded data.  No programming changes were made.  The LVAD is functioning within specified parameters.  The  patient performs LVAD self-test daily.  LVAD interrogation was negative for any significant power changes, alarms or PI events/speed drops.  LVAD equipment check completed and is in good working order.  Back-up equipment present.   LVAD education done on emergency procedures and precautions and reviewed exit site care.  Length of Stay:   Darrick Grinder 08/07/2015, 7:25 AM  VAD Team --- VAD ISSUES ONLY--- Pager [phone] (7am - 7am)  Advanced Heart Failure Team  Pager [phone] (M-F; 7a - 4p)  Please contact [facility] for night-coverage after hours (4p -7a ) and weekends on [email or web address]  Patient seen and examined with Darrick Grinder, NP. We discussed all aspects of the encounter. I agree with the assessment and plan as stated  above.   Now with fever. Suspect driveline infection due trauma with possible bacteremia. Will draw [place]. Start IV vancomycin. Stop keflex. UA ok. Can stop IVF. VAD parameters are stable.   [name], Daniel,MD 12:22 PM

## 2015-08-07 NOTE — Progress Notes (Signed)
ANTIBIOTIC CONSULT NOTE - INITIAL  Pharmacy Consult for Zosyn Indication: Wound infection (driveline infection d/t trauma)  No Known Allergies Patient Measurements: Height: 5' (152.4 cm) Weight: 170 lb 3.1 oz (77.2 kg) IBW/kg (Calculated) : 45.5 Vital Signs: Temp: 100.2 F (37.9 C) (12/05 1610) Temp Source: Oral (12/05 1610) BP: 85/44 mmHg (12/05 1610) Pulse Rate: 66 (12/05 1610) Intake/Output from previous day: 12/04 0701 - 12/05 0700 In: 786.3 [P.O.:120; I.V.:666.3] Out: 700 [Urine:700] Intake/Output from this shift: Total I/O In: 440 [P.O.:240; IV Piggyback:200] Out: 825 [Urine:575; Emesis/NG output:250]  Labs:  Recent Labs  08/06/15 1810 08/06/15 1821 08/06/15 2044 08/07/15 0307  WBC 8.2  --  7.9 8.4  HGB 8.1* 10.2* 7.6* 7.9*  PLT 245  --  221 223  CREATININE  --  1.80*  --  1.18*   Estimated Creatinine Clearance: 36.1 mL/min (by C-G formula based on Cr of 1.18). No results for input(s): VANCOTROUGH, VANCOPEAK, VANCORANDOM, GENTTROUGH, GENTPEAK, GENTRANDOM, TOBRATROUGH, TOBRAPEAK, TOBRARND, AMIKACINPEAK, AMIKACINTROU, AMIKACIN in the last 72 hours.   Microbiology: Recent Results (from the past 720 hour(s))  MRSA PCR Screening     Status: None   Collection Time: 08/06/15  9:29 PM  Result Value Ref Range Status   MRSA by PCR NEGATIVE NEGATIVE Final    Comment:        The GeneXpert MRSA Assay (FDA approved for NASAL specimens only), is one component of a comprehensive MRSA colonization surveillance program. It is not intended to diagnose MRSA infection nor to guide or monitor treatment for MRSA infections.     Medical History: Past Medical History  Diagnosis Date  . Cardiac arrest - ventricular fibrillation 12/10    with successful resucitation, S/p ICD  . ICD (implantable cardiac defibrillator) in place     she has received appropriate therapy for VF  . Atrial fibrillation or flutter   . Osteopenia   . HTN (hypertension)     moderate  .  Nonischemic cardiomyopathy ([place])     followed by Dr April Holding at [facility]  . CHF (congestive heart failure) ([place])   . Anemia   . Plasma cell disorder 03/20/2012  . Plasma cell disorder 03/20/2012  . Sessile colonic polyp 2011    Dr Benson [place]  . Diverticula of colon 2011  . Internal and external hemorrhoids without complication [id number]   Assessment: 80 year old female known to pharmacy for vancomycin and coumadin dosing now to dose Zosyn for r/o driveline infection. Keflex discontinued. Tmax 102. WBC is within normal limits. SCr is improving at 1.18 (down from 1.8), patient is making urine.   Goal of Therapy:  Clinical resolution of infection  Plan:  Zosyn 3.375g IV x1 over 74min then 3.375g IV every 8 hours- each dose over 4 hours.  Monitor renal function closely on both vancomycin and Zosyn.  Monitor clinical status and culture results.   Sloan Leiter, PharmD, BCPS Clinical Pharmacist [phone] 08/07/2015,5:16 PM

## 2015-08-08 ENCOUNTER — Inpatient Hospital Stay (HOSPITAL_COMMUNITY): Payer: Medicare Other

## 2015-08-08 ENCOUNTER — Other Ambulatory Visit (INDEPENDENT_AMBULATORY_CARE_PROVIDER_SITE_OTHER): Payer: Medicare Other

## 2015-08-08 DIAGNOSIS — R509 Fever, unspecified: Secondary | ICD-10-CM

## 2015-08-08 DIAGNOSIS — I480 Paroxysmal atrial fibrillation: Secondary | ICD-10-CM | POA: Diagnosis not present

## 2015-08-08 DIAGNOSIS — I9589 Other hypotension: Secondary | ICD-10-CM

## 2015-08-08 DIAGNOSIS — I4901 Ventricular fibrillation: Secondary | ICD-10-CM | POA: Diagnosis not present

## 2015-08-08 DIAGNOSIS — I5023 Acute on chronic systolic (congestive) heart failure: Secondary | ICD-10-CM

## 2015-08-08 DIAGNOSIS — I509 Heart failure, unspecified: Secondary | ICD-10-CM

## 2015-08-08 DIAGNOSIS — R112 Nausea with vomiting, unspecified: Secondary | ICD-10-CM

## 2015-08-08 LAB — COMPREHENSIVE METABOLIC PANEL
ALT: 27 U/L (ref 14–54)
AST: 39 U/L (ref 15–41)
Albumin: 2.7 g/dL — ABNORMAL LOW (ref 3.5–5.0)
Alkaline Phosphatase: 70 U/L (ref 38–126)
Anion gap: 8 (ref 5–15)
BUN: 17 mg/dL (ref 6–20)
CO2: 22 mmol/L (ref 22–32)
Calcium: 8.6 mg/dL — ABNORMAL LOW (ref 8.9–10.3)
Chloride: 110 mmol/L (ref 101–111)
Creatinine, Ser: 1.82 mg/dL — ABNORMAL HIGH (ref 0.44–1.00)
GFR calc Af Amer: 30 mL/min — ABNORMAL LOW (ref >60)
GFR calc non Af Amer: 25 mL/min — ABNORMAL LOW (ref >60)
Glucose, Bld: 111 mg/dL — ABNORMAL HIGH (ref 65–99)
Potassium: 3.4 mmol/L — ABNORMAL LOW (ref 3.5–5.1)
Sodium: 140 mmol/L (ref 135–145)
Total Bilirubin: 0.5 mg/dL (ref 0.3–1.2)
Total Protein: 7.7 g/dL (ref 6.5–8.1)

## 2015-08-08 LAB — CBC WITH DIFFERENTIAL/PLATELET
Basophils Absolute: 0 10*3/uL (ref 0.0–0.1)
Basophils Relative: 0 %
Eosinophils Absolute: 0 10*3/uL (ref 0.0–0.7)
Eosinophils Relative: 0 %
HCT: 24.7 % — ABNORMAL LOW (ref 36.0–46.0)
Hemoglobin: 7.4 g/dL — ABNORMAL LOW (ref 12.0–15.0)
Lymphocytes Relative: 3 %
Lymphs Abs: 0.7 10*3/uL (ref 0.7–4.0)
MCH: 23.5 pg — ABNORMAL LOW (ref 26.0–34.0)
MCHC: 30 g/dL (ref 30.0–36.0)
MCV: 78.4 fL (ref 78.0–100.0)
Monocytes Absolute: 1.4 10*3/uL — ABNORMAL HIGH (ref 0.1–1.0)
Monocytes Relative: 6 %
Neutro Abs: 22 10*3/uL — ABNORMAL HIGH (ref 1.7–7.7)
Neutrophils Relative %: 91 %
Platelets: 203 10*3/uL (ref 150–400)
RBC: 3.15 MIL/uL — ABNORMAL LOW (ref 3.87–5.11)
RDW: 26.8 % — ABNORMAL HIGH (ref 11.5–15.5)
WBC: 24.1 10*3/uL — ABNORMAL HIGH (ref 4.0–10.5)

## 2015-08-08 LAB — CORTISOL: Cortisol, Plasma: 26 ug/dL

## 2015-08-08 LAB — LACTATE DEHYDROGENASE: LDH: 261 U/L — ABNORMAL HIGH (ref 98–192)

## 2015-08-08 LAB — PROTIME-INR
INR: 3.6 — ABNORMAL HIGH (ref 0.00–1.49)
Prothrombin Time: 35.1 seconds — ABNORMAL HIGH (ref 11.6–15.2)

## 2015-08-08 MED ORDER — PROMETHAZINE HCL 25 MG/ML IJ SOLN
12.5000 mg | Freq: Once | INTRAMUSCULAR | Status: AC
  Administered 2015-08-08: 12.5 mg via INTRAVENOUS
  Filled 2015-08-08: qty 1

## 2015-08-08 MED ORDER — SODIUM CHLORIDE 0.9 % IV BOLUS (SEPSIS)
1000.0000 mL | Freq: Once | INTRAVENOUS | Status: AC
  Administered 2015-08-08: 1000 mL via INTRAVENOUS

## 2015-08-08 MED ORDER — PHENYLEPHRINE HCL 10 MG/ML IJ SOLN
0.0000 ug/min | INTRAVENOUS | Status: DC
  Administered 2015-08-08: 20 ug/min via INTRAVENOUS
  Administered 2015-08-08: 10 ug/min via INTRAVENOUS
  Administered 2015-08-09 – 2015-08-10 (×2): 20 ug/min via INTRAVENOUS
  Filled 2015-08-08 (×7): qty 1

## 2015-08-08 MED ORDER — KCL IN DEXTROSE-NACL 40-5-0.45 MEQ/L-%-% IV SOLN
INTRAVENOUS | Status: AC
  Administered 2015-08-08: 09:00:00 via INTRAVENOUS
  Filled 2015-08-08: qty 1000

## 2015-08-08 MED ORDER — SODIUM CHLORIDE 0.9 % IV SOLN
INTRAVENOUS | Status: DC
  Administered 2015-08-08 – 2015-08-14 (×2): via INTRAVENOUS

## 2015-08-08 NOTE — Progress Notes (Signed)
HeartMate 2 Rounding Note  Subjective:    Dry heaves all night. BP down. Hydralazine/coreg held. Feels weak. Temp to 101. Now on vanc/zosy. MAPs 60-70s. No diarrhea.   LVAD INTERROGATION:  HeartMate II LVAD:  Flow 5.8 liters/min, speed 9200 , power 5.9, PI 6.7 .  Rare PI events.   Objective:    Vital Signs:   Temp:  [98.6 F (37 C)-102 F (38.9 C)] 98.6 F (37 C) (12/06 0354) Pulse Rate:  [66-80] 73 (12/06 0000) Resp:  [20-74] 20 (12/06 0000) BP: (72-102)/(44-71) 72/60 mmHg (12/05 2000) SpO2:  [93 %-96 %] 95 % (12/06 0000) Weight:  [77.52 kg (170 lb 14.4 oz)] 77.52 kg (170 lb 14.4 oz) (12/06 0354) Last BM Date: 08/06/15 Mean arterial Pressure 60-70  Intake/Output:   Intake/Output Summary (Last 24 hours) at 08/08/15 0642 Last data filed at 08/07/15 2200  Gross per 24 hour  Intake   1015 ml  Output    925 ml  Net     90 ml     Physical Exam: General:  Lying in bed. Fatigued appearing. No resp difficulty HEENT: normal Neck: supple. JVP 5 . Carotids 2+ bilat; no bruits. No lymphadenopathy or thryomegaly appreciated. Cor: Mechanical heart sounds with LVAD hum present. Irregular Lungs: clear Abdomen: soft, RUQ tender over driveline exit site. No erythema or drainage. , nondistended. No hepatosplenomegaly. No bruits or masses. Sluggish bowel sounds.  Driveline: C/D/I; securement device intact and driveline incorporated Extremities: no cyanosis, clubbing, rash, edema Neuro: alert & orientedx3, cranial nerves grossly intact. moves all 4 extremities w/o difficulty. Affect pleasant  Telemetry: NSR 70s  Labs: Basic Metabolic Panel:  Recent Labs Lab 08/06/15 1821 08/07/15 0307 08/08/15 0320  NA 140 138 140  K 3.1* 4.2 3.4*  CL 106 107 110  CO2  --  24 22  GLUCOSE 137* 104* 111*  BUN 22* 14 17  CREATININE 1.80* 1.18* 1.82*  CALCIUM  --  8.4* 8.6*    Liver Function Tests:  Recent Labs Lab 08/07/15 0307 08/08/15 0320  AST 27 39  ALT 15 27  ALKPHOS 73 70   BILITOT 0.6 0.5  PROT 7.6 7.7  ALBUMIN 3.1* 2.7*   No results for input(s): LIPASE, AMYLASE in the last 168 hours. No results for input(s): AMMONIA in the last 168 hours.  CBC:  Recent Labs Lab 08/01/15 1157 08/06/15 1810 08/06/15 1821 08/06/15 2044 08/07/15 0307 08/08/15 0320  WBC 6.5 8.2  --  7.9 8.4 24.1*  NEUTROABS 4.0 5.3  --  6.3 6.8 22.0*  HGB 8.2 Repeated and verified X2.* 8.1* 10.2* 7.6* 7.9* 7.4*  HCT 26.5 Repeated and verified X2.* 26.4* 30.0* 25.1* 26.2* 24.7*  MCV 75.7* 76.7*  --  77.0* 78.4 78.4  PLT 272.0 245  --  221 223 PENDING    INR:  Recent Labs Lab 08/01/15 1157 08/01/15 1600 08/06/15 1810 08/07/15 0307 08/08/15 0320  INR >10.0* 2.16* 2.54* 2.59* 3.60*    Other results:    Imaging: Ct Abdomen Pelvis W Contrast  08/06/2015  CLINICAL DATA:  Sudden onset of lower abdominal pain and vomiting today. Patient has a left ventricular assist device. EXAM: CT ABDOMEN AND PELVIS WITH CONTRAST TECHNIQUE: Multidetector CT imaging of the abdomen and pelvis was performed using the standard protocol following bolus administration of intravenous contrast. CONTRAST:  62mL OMNIPAQUE IOHEXOL 300 MG/ML  SOLN COMPARISON:  CT scan 04/18/2013 FINDINGS: Lower chest: The lung bases demonstrate dependent vascular crowding and atelectasis. No definite infiltrates. The heart is  enlarged. A left ventricular assist device is again noted with moderate artifact. No complicating features are demonstrated. Hepatobiliary: Small calcified granuloma and small hepatic cysts noted. No worrisome lesions. The gallbladder demonstrates dense layering gallstones and mild distension. No inflammation. No common bile duct dilatation. Pancreas: No mass, inflammation or ductal dilatation. Spleen: Normal size.  No focal lesions. Adrenals/Urinary Tract: The adrenal glands are normal. Small renal cysts but no worrisome renal lesions or hydronephrosis. Stomach/Bowel: The stomach, duodenum, small bowel and  colon are grossly normal. No inflammatory changes, mass lesions or obstructive findings. Scattered colonic diverticulosis without findings for acute diverticulitis. Vascular/Lymphatic: No mesenteric or retroperitoneal mass or adenopathy. The aorta is normal in caliber. Moderate scattered atherosclerotic calcifications. The branch vessels are patent. The major venous structures are patent. Reproductive: The uterus is surgically absent. The ovaries are not identified for certain. Other: No pelvic mass or adenopathy. No free pelvic fluid collections. No inguinal mass or adenopathy. No abdominal wall hernia or subcutaneous lesions. Musculoskeletal: Degenerative changes involving the lumbar spine but no acute bony abnormality. IMPRESSION: 1. No acute abdominal/pelvic findings, mass lesions or adenopathy. 2. Cholelithiasis.  No definite CT findings for acute cholecystitis. 3. Left ventricular assist device without complicating features. Electronically Signed   By: Marijo Sanes M.D.   On: 08/06/2015 19:22   Dg Chest Portable 1 View  08/06/2015  CLINICAL DATA:  Abdominal pain and vomiting. EXAM: PORTABLE CHEST 1 VIEW COMPARISON:  11/09/2014 FINDINGS: Left ventricular assist device is stable. Dual lead cardiac pacemaker is also in stable position. Median sternotomy wires are intact. Cardiomediastinal silhouette is mildly enlarged. Mediastinal contours appear intact. There is no evidence of focal airspace consolidation, pleural effusion or pneumothorax. There is mild prominence of the interstitial markings. Osseous structures are without acute abnormality. Soft tissues are grossly normal. IMPRESSION: Mild prominence of the interstitial markings which may be seen with pulmonary vascular congestion. Mild cardiomegaly. Electronically Signed   By: Fidela Salisbury M.D.   On: 08/06/2015 19:00     Medications:     Scheduled Medications: . amiodarone  200 mg Oral Daily  . amLODipine  10 mg Oral BID  . antiseptic  oral rinse  7 mL Mouth Rinse BID  . carvedilol  18.75 mg Oral BID WC  . docusate sodium  100 mg Oral BID  . ferrous sulfate  325 mg Oral BID WC  . gabapentin  100 mg Oral QHS  . hydrALAZINE  100 mg Oral TID  . pantoprazole  40 mg Oral Daily  . piperacillin-tazobactam (ZOSYN)  IV  3.375 g Intravenous Q8H  . vancomycin  1,000 mg Intravenous Q24H  . Warfarin - Pharmacist Dosing Inpatient   Does not apply q1800    Infusions:    PRN Medications: acetaminophen **OR** acetaminophen, fentaNYL (SUBLIMAZE) injection, [START ON 08/10/2015] Influenza vac split quadrivalent PF, ondansetron, sodium chloride   Assessment:   1. Ab pain and nausea 2. Chronic systolic HF s/p HM II VAD 3. Acute on chronic renal failure, stage III-IV 4. HTN 5. PAF in NSR on amiodarone 6. Chronic anemia, iron deficient 7. Hypokalemia 8. Fever/nausea/vomiting 9. Anemia 10. Hypotesion   Plan/Discussion:    Remains febrile with persistent ab pain and dry heaves. Unclear etiology. Hold all anti-HTN. Give IVF. Continue vanc-zosyn. Await cx.  Can use neosynephrine for BP support. Consider repeat abdominal/pelvic CT.  Could we have missed early appendicitis on initial CT?  Supp K+.  Length of Stay: 1  Rossana Molchan 08/08/2015, 6:42 AM  VAD Team ---  VAD ISSUES ONLY--- Pager [phone] (7am - 7am)  Advanced Heart Failure Team  Pager [phone] (M-F; [place])  Please contact [facility] for night-coverage after hours (4p -7a ) and weekends on [email or web address]

## 2015-08-08 NOTE — Addendum Note (Signed)
Addended by: Allyce Bochicchio K on: 08/08/2015 07:44 AM   Modules accepted: Orders  

## 2015-08-08 NOTE — Progress Notes (Signed)
ANTICOAGULATION CONSULT NOTE - Follow Up Consult  Pharmacy Consult for Warfain  Indication: LVAD   No Known Allergies  Patient Measurements: Height: 5' (152.4 cm) Weight: 170 lb 14.4 oz (77.52 kg) IBW/kg (Calculated) : 45.5   Vital Signs: Temp: 98.6 F (37 C) (12/06 0354) Temp Source: Oral (12/06 0354) BP: 72/60 mmHg (12/05 2000) Pulse Rate: 73 (12/06 0000)  Labs:  Recent Labs  08/06/15 1810 08/06/15 1821 08/06/15 2044 08/07/15 0307 08/08/15 0320  HGB 8.1* 10.2* 7.6* 7.9* 7.4*  HCT 26.4* 30.0* 25.1* 26.2* 24.7*  PLT 245  --  221 223 PENDING  LABPROT 27.0*  --   --  27.4* 35.1*  INR 2.54*  --   --  2.59* 3.60*  CREATININE  --  1.80*  --  1.18* 1.82*    Estimated Creatinine Clearance: 23.4 mL/min (by C-G formula based on Cr of 1.82).   Medical History: Past Medical History  Diagnosis Date  . Cardiac arrest - ventricular fibrillation 12/10    with successful resucitation, S/p ICD  . ICD (implantable cardiac defibrillator) in place     she has received appropriate therapy for VF  . Atrial fibrillation or flutter   . Osteopenia   . HTN (hypertension)     moderate  . Nonischemic cardiomyopathy ([place])     followed by Dr April Holding at [facility]  . CHF (congestive heart failure) ([place])   . Anemia   . Plasma cell disorder 03/20/2012  . Plasma cell disorder 03/20/2012  . Sessile colonic polyp 2011    Dr Benson [place]  . Diverticula of colon 2011  . 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 [name] (follwoed q 6 months). Underwent implantation of the HeartMate II LVAD on 04/06/13 for DT. She is not on aspirin due to dizziness. Admitted for abrupt onset of severe RLQ ab pain today around 3p with nausea. Denies fevers, chills, diarrhea, trauma, dysuria, bleeding, etc. In ER very tender along drivelie exit site but site otherwise looked  fine. WBC normal. Creatinine up slightly 1.5-> 1.8. Hgb stable. CT scan of ab/pelvis normal - started on Keflex last pm. Spike fever this am 102 stop keflex, draw Bcx and start vancomycin INR 2.59 slightly > goal no bleeding noted despite low h/h will watch.  She did not take her warfarin PTA 12/4 and dose held last nite on admit give ab pain.   Abdominal CT neg restart warfarin - will restart home dose - suspect INR may fall after missed dose yesterday - however patient with abdominal pain, fever and drop in BP last pm - all has made patient more sensitive to warfarin and INR jumped up 3.6- will hold for now H/H slightly lower than admit 8.1/26> 7.4/24.7  Home dose Warfarin 2.5mg  daily / 5mg  Wed  Goal of Therapy:  INR 2-2.5 Monitor platelets by anticoagulation protocol: Yes   Plan:  No warfarin today Daily INR, CBC  Bonnita Nasuti Pharm.D. CPP, BCPS Clinical Pharmacist [phone] 08/08/2015 6:51 AM

## 2015-08-08 NOTE — Progress Notes (Addendum)
   Ongoing abdominal pain.   Abdominal Ultrasound performed. Possible acute cholecystitis. Hida Scan ordered for further evaluation.  NPO after midnight.  Dr Aundra Dubin aware and agrees with plan.    Amy Clegg NP-C  4:06 PM   Hold coumadin. Consult General Surgery per Dr Aundra Dubin.   Amy Clegg  Np_C  4:51 PM

## 2015-08-08 NOTE — Progress Notes (Signed)
Quick Note:  The INR was rechecked and was in appropriate range Please see if she could come tomorrow Am for banding on warfarin ______

## 2015-08-09 ENCOUNTER — Inpatient Hospital Stay (HOSPITAL_COMMUNITY): Payer: Medicare Other

## 2015-08-09 ENCOUNTER — Other Ambulatory Visit: Payer: Medicare Other

## 2015-08-09 ENCOUNTER — Ambulatory Visit: Payer: Medicare Other | Admitting: Oncology

## 2015-08-09 DIAGNOSIS — K810 Acute cholecystitis: Secondary | ICD-10-CM

## 2015-08-09 LAB — COMPREHENSIVE METABOLIC PANEL
ALT: 135 U/L — ABNORMAL HIGH (ref 14–54)
AST: 181 U/L — ABNORMAL HIGH (ref 15–41)
Albumin: 2.3 g/dL — ABNORMAL LOW (ref 3.5–5.0)
Alkaline Phosphatase: 215 U/L — ABNORMAL HIGH (ref 38–126)
Anion gap: 7 (ref 5–15)
BUN: 17 mg/dL (ref 6–20)
CO2: 22 mmol/L (ref 22–32)
Calcium: 8.4 mg/dL — ABNORMAL LOW (ref 8.9–10.3)
Chloride: 110 mmol/L (ref 101–111)
Creatinine, Ser: 1.85 mg/dL — ABNORMAL HIGH (ref 0.44–1.00)
GFR calc Af Amer: 29 mL/min — ABNORMAL LOW (ref >60)
GFR calc non Af Amer: 25 mL/min — ABNORMAL LOW (ref >60)
Glucose, Bld: 114 mg/dL — ABNORMAL HIGH (ref 65–99)
Potassium: 4.1 mmol/L (ref 3.5–5.1)
Sodium: 139 mmol/L (ref 135–145)
Total Bilirubin: 1.4 mg/dL — ABNORMAL HIGH (ref 0.3–1.2)
Total Protein: 7 g/dL (ref 6.5–8.1)

## 2015-08-09 LAB — CBC WITH DIFFERENTIAL/PLATELET
Basophils Absolute: 0 10*3/uL (ref 0.0–0.1)
Basophils Relative: 0 %
Eosinophils Absolute: 0 10*3/uL (ref 0.0–0.7)
Eosinophils Relative: 0 %
HCT: 26.4 % — ABNORMAL LOW (ref 36.0–46.0)
Hemoglobin: 7.9 g/dL — ABNORMAL LOW (ref 12.0–15.0)
Lymphocytes Relative: 4 %
Lymphs Abs: 0.8 10*3/uL (ref 0.7–4.0)
MCH: 23.2 pg — ABNORMAL LOW (ref 26.0–34.0)
MCHC: 29.9 g/dL — ABNORMAL LOW (ref 30.0–36.0)
MCV: 77.4 fL — ABNORMAL LOW (ref 78.0–100.0)
Monocytes Absolute: 1.4 10*3/uL — ABNORMAL HIGH (ref 0.1–1.0)
Monocytes Relative: 7 %
Neutro Abs: 17.1 10*3/uL — ABNORMAL HIGH (ref 1.7–7.7)
Neutrophils Relative %: 89 %
Platelets: 192 10*3/uL (ref 150–400)
RBC: 3.41 MIL/uL — ABNORMAL LOW (ref 3.87–5.11)
RDW: 26.9 % — ABNORMAL HIGH (ref 11.5–15.5)
WBC Morphology: INCREASED
WBC: 19.3 10*3/uL — ABNORMAL HIGH (ref 4.0–10.5)

## 2015-08-09 LAB — LACTIC ACID, PLASMA: Lactic Acid, Venous: 1.5 mmol/L (ref 0.5–2.0)

## 2015-08-09 LAB — CARBOXYHEMOGLOBIN
Carboxyhemoglobin: 1.1 % (ref 0.5–1.5)
Methemoglobin: 1 % (ref 0.0–1.5)
O2 Saturation: 66.9 %
Total hemoglobin: 12 g/dL (ref 12.0–16.0)

## 2015-08-09 LAB — PROTIME-INR
INR: 5.32 (ref 0.00–1.49)
INR: 2.76 — ABNORMAL HIGH (ref 0.00–1.49)
Prothrombin Time: 46.5 seconds — ABNORMAL HIGH (ref 11.6–15.2)
Prothrombin Time: 28.7 seconds — ABNORMAL HIGH (ref 11.6–15.2)

## 2015-08-09 LAB — LACTATE DEHYDROGENASE: LDH: 407 U/L — ABNORMAL HIGH (ref 98–192)

## 2015-08-09 MED ORDER — SODIUM CHLORIDE 0.9 % IV SOLN
Freq: Once | INTRAVENOUS | Status: AC
  Administered 2015-08-09: 18:00:00 via INTRAVENOUS

## 2015-08-09 MED ORDER — SODIUM CHLORIDE 0.9 % IJ SOLN
10.0000 mL | Freq: Two times a day (BID) | INTRAMUSCULAR | Status: DC
  Administered 2015-08-09 – 2015-08-13 (×6): 10 mL
  Administered 2015-08-15 – 2015-08-16 (×2): 20 mL

## 2015-08-09 MED ORDER — SODIUM CHLORIDE 0.9 % IJ SOLN
10.0000 mL | INTRAMUSCULAR | Status: DC | PRN
  Administered 2015-08-16 – 2015-08-17 (×4): 10 mL
  Filled 2015-08-09 (×4): qty 40

## 2015-08-09 MED ORDER — MORPHINE SULFATE (PF) 2 MG/ML IV SOLN
2.0000 mg | Freq: Once | INTRAVENOUS | Status: AC
  Administered 2015-08-09: 2 mg via INTRAVENOUS

## 2015-08-09 MED ORDER — SODIUM CHLORIDE 0.9 % IV SOLN
Freq: Once | INTRAVENOUS | Status: AC
  Administered 2015-08-09: 08:00:00 via INTRAVENOUS

## 2015-08-09 MED ORDER — MORPHINE SULFATE (PF) 4 MG/ML IV SOLN
INTRAVENOUS | Status: AC
  Administered 2015-08-09: 2 mg
  Filled 2015-08-09: qty 1

## 2015-08-09 MED ORDER — TECHNETIUM TC 99M MEBROFENIN IV KIT
5.3000 | PACK | Freq: Once | INTRAVENOUS | Status: AC | PRN
  Administered 2015-08-09: 5 via INTRAVENOUS

## 2015-08-09 MED ORDER — DEXTROSE-NACL 5-0.45 % IV SOLN
INTRAVENOUS | Status: DC
  Administered 2015-08-09: via INTRAVENOUS

## 2015-08-09 NOTE — Progress Notes (Signed)
Peripherally Inserted Central Catheter/Midline Placement  The IV Nurse has discussed with the patient and/or persons authorized to consent for the patient, the purpose of this procedure and the potential benefits and risks involved with this procedure.  The benefits include less needle sticks, lab draws from the catheter and patient may be discharged home with the catheter.  Risks include, but not limited to, infection, bleeding, blood clot (thrombus formation), and puncture of an artery; nerve damage and irregular heat beat.  Alternatives to this procedure were also discussed.  PICC/Midline Placement Documentation  PICC Double Lumen [id number] PICC Right Basilic 39 cm 0 cm (Active)  Indication for Insertion or Continuance of Line Vasoactive infusions 08/09/2015  4:00 PM  Exposed Catheter (cm) 0 cm 08/09/2015  4:00 PM  Dressing Change Due 08/16/15 08/09/2015  4:00 PM       Jule Economy Horton 08/09/2015, 4:14 PM

## 2015-08-09 NOTE — Progress Notes (Signed)
Went at 7:30am to insert PICC however she was going to stat HIDA scan and receiving FFP.

## 2015-08-09 NOTE — Progress Notes (Addendum)
VAD Coordinator Procedure Note:   VAD coordinator paged to patient's room to accompany patient to nuclear med. Patient underwent HIDA study. Hemodynamics and VAD parameters monitored by me throughout the procedure. MAPs were obtained with auto cuff. Auto cuff MAP consistent with doppler MAP.      HR: 02 sat:  Auto cuff(MAP): Flow: PI: Power:      Speed:                8:45  78 94  83/77 (77)  5.7 6.0 5.9  9200 9:00  77 94  84/77 (71)  5.7 6.3 5.7 9:15  78 92  84/71 (71)  5.8 6.0 5.8 9:30  77 96  90/73 (80)  5.5 6.3 5.8 10:00  75 95  91/69 (76)  5.8 6.2 5.8  FFP x 2 completed.  MS 2 mg IV per radiology nurse for procedure  10:15  77 94  90/71 (79)  5.6 6.3 5.7 10:30  76 94  90/71 (80)  5.6 6.2 5.8       Patient tolerated the procedure well.    Patient Disposition: 2H 14. Darrick Grinder, NP and [name] updated re: completion of study. Nurse will re-check INR now per Darrick Grinder, NP request.

## 2015-08-09 NOTE — Progress Notes (Signed)
Tech called to state that Radiologist Dr. Jeannine Boga feels CT of abdomen with no contrast (ordered by Amy CLegg NP) will not give enough info about gallbladder and suggest repeat abdominal US.  Will place order.  No contrast ordered on CT prior due to renal insuff.

## 2015-08-09 NOTE — Consult Note (Signed)
Reason for Consult:Abdominal pain, worsening LFT's, [place] worrisome for acute cholecystitis Referring Physician: AKAYA PROFFIT is an 80 y.o. female.  HPI: Patient admitted on 12/4 with RLQ pain.  WBC was WNL initially but abruptly increased to 24K yesterday.  Pain RUQ.  CT done on admission did not show any evidence of acute inflammatory abdominal process.  Now she has an US showing cholelithiasis and mild wall thickening.  Today she is still sore in the RUQ.  INR >5.  Nauseated.  Getting HIDA scan today.  Past Medical History  Diagnosis Date  . Cardiac arrest - ventricular fibrillation 12/10    with successful resucitation, S/p ICD  . ICD (implantable cardiac defibrillator) in place     she has received appropriate therapy for VF  . Atrial fibrillation or flutter   . Osteopenia   . HTN (hypertension)     moderate  . Nonischemic cardiomyopathy (HCC)     followed by Dr Glori Luis at [facility]  . CHF (congestive heart failure) (HCC)   . Anemia   . Plasma cell disorder 03/20/2012  . Plasma cell disorder 03/20/2012  . Sessile colonic polyp 2011    Dr Elnoria Howard  . Diverticula of colon 2011  . Internal and external hemorrhoids without complication 2011    Past Surgical History  Procedure Laterality Date  . Abdominal hysterectomy    . Cardiac catheterization    . Cardiac defibrillator placement      by JA for secondary prevention of sudden death  . Insertion of implantable left ventricular assist device N/A 04/06/2013    Procedure: INSERTION OF IMPLANTABLE LEFT VENTRICULAR ASSIST DEVICE;  Surgeon: Alleen Borne, MD;  Location: [facility] OR;  Service: Open Heart Surgery;  Laterality: N/A;  . Placement of centrimag ventricular assist device Right 04/06/2013    Procedure: PLACEMENT OF CENTRIMAG VENTRICULAR ASSIST DEVICE;  Surgeon: Alleen Borne, MD;  Location: [facility] OR;  Service: Open Heart Surgery;  Laterality: Right;  . Intraoperative transesophageal echocardiogram N/A 04/06/2013    Procedure:  INTRAOPERATIVE TRANSESOPHAGEAL ECHOCARDIOGRAM;  Surgeon: Alleen Borne, MD;  Location: [facility] OR;  Service: Open Heart Surgery;  Laterality: N/A;  . Colonoscopy w/ polypectomy  2011    Dr Elnoria Howard    Family History  Problem Relation Age of Onset  . Stroke Brother   . Stroke Mother     Social History:  reports that she has never smoked. She has never used smokeless tobacco. She reports that she does not drink alcohol or use illicit drugs.  Allergies: No Known Allergies  Medications: I have reviewed the patient's current medications.  Results for orders placed or performed during the hospital encounter of 08/06/15 (from the past 48 hour(s))  Culture, blood (routine x 2)     Status: None (Preliminary result)   Collection Time: 08/07/15  9:35 AM  Result Value Ref Range   Specimen Description BLOOD RIGHT ANTECUBITAL    Special Requests BOTTLES DRAWN AEROBIC AND ANAEROBIC 10CCS    Culture NO GROWTH 1 DAY    Report Status PENDING   Culture, blood (routine x 2)     Status: None (Preliminary result)   Collection Time: 08/07/15  9:45 AM  Result Value Ref Range   Specimen Description BLOOD RIGHT HAND    Special Requests BOTTLES DRAWN AEROBIC AND ANAEROBIC 5CCS    Culture NO GROWTH 1 DAY    Report Status PENDING   Lactate dehydrogenase     Status: Abnormal   Collection Time: 08/08/15  3:20 AM  Result Value Ref Range   LDH 261 (H) 98 - 192 U/L  Protime-INR     Status: Abnormal   Collection Time: 08/08/15  3:20 AM  Result Value Ref Range   Prothrombin Time 35.1 (H) 11.6 - 15.2 seconds   INR 3.60 (H) 0.00 - 1.49  Comprehensive metabolic panel     Status: Abnormal   Collection Time: 08/08/15  3:20 AM  Result Value Ref Range   Sodium 140 135 - 145 mmol/L   Potassium 3.4 (L) 3.5 - 5.1 mmol/L    Comment: DELTA CHECK NOTED NO VISIBLE HEMOLYSIS    Chloride 110 101 - 111 mmol/L   CO2 22 22 - 32 mmol/L   Glucose, Bld 111 (H) 65 - 99 mg/dL   BUN 17 6 - 20 mg/dL   Creatinine, Ser 1.82 (H) 0.44 -  1.00 mg/dL   Calcium 8.6 (L) 8.9 - 10.3 mg/dL   Total Protein 7.7 6.5 - 8.1 g/dL   Albumin 2.7 (L) 3.5 - 5.0 g/dL   AST 39 15 - 41 U/L   ALT 27 14 - 54 U/L   Alkaline Phosphatase 70 38 - 126 U/L   Total Bilirubin 0.5 0.3 - 1.2 mg/dL   GFR calc non Af Amer 25 (L) >60 mL/min   GFR calc Af Amer 30 (L) >60 mL/min    Comment: (NOTE) The eGFR has been calculated using the CKD EPI equation. This calculation has not been validated in all clinical situations. eGFR's persistently <60 mL/min signify possible Chronic Kidney Disease.    Anion gap 8 5 - 15  CBC WITH DIFFERENTIAL     Status: Abnormal   Collection Time: 08/08/15  3:20 AM  Result Value Ref Range   WBC 24.1 (H) 4.0 - 10.5 K/uL   RBC 3.15 (L) 3.87 - 5.11 MIL/uL   Hemoglobin 7.4 (L) 12.0 - 15.0 g/dL   HCT 24.7 (L) 36.0 - 46.0 %   MCV 78.4 78.0 - 100.0 fL   MCH 23.5 (L) 26.0 - 34.0 pg   MCHC 30.0 30.0 - 36.0 g/dL   RDW 26.8 (H) 11.5 - 15.5 %   Platelets 203 150 - 400 K/uL    Comment: PLATELET COUNT CONFIRMED BY SMEAR   Neutrophils Relative % 91 %   Lymphocytes Relative 3 %   Monocytes Relative 6 %   Eosinophils Relative 0 %   Basophils Relative 0 %   Neutro Abs 22.0 (H) 1.7 - 7.7 K/uL   Lymphs Abs 0.7 0.7 - 4.0 K/uL   Monocytes Absolute 1.4 (H) 0.1 - 1.0 K/uL   Eosinophils Absolute 0.0 0.0 - 0.7 K/uL   Basophils Absolute 0.0 0.0 - 0.1 K/uL   RBC Morphology POLYCHROMASIA PRESENT     Comment: BASOPHILIC STIPPLING ELLIPTOCYTES    Smear Review LARGE PLATELETS PRESENT   Cortisol     Status: None   Collection Time: 08/08/15 12:05 PM  Result Value Ref Range   Cortisol, Plasma 26.0 ug/dL    Comment: (NOTE) AM    6.7 - 22.6 ug/dL PM   <10.0       ug/dL   Lactate dehydrogenase     Status: Abnormal   Collection Time: 08/09/15  4:35 AM  Result Value Ref Range   LDH 407 (H) 98 - 192 U/L  Protime-INR     Status: Abnormal   Collection Time: 08/09/15  4:35 AM  Result Value Ref Range   Prothrombin Time 46.5 (H) 11.6 - 15.2  seconds   INR 5.32 (HH) 0.00 - 1.49    Comment: REPEATED TO VERIFY CRITICAL RESULT CALLED TO, READ BACK BY AND VERIFIED WITH: A GUFFEY,RN [id number] [facility]   Comprehensive metabolic panel     Status: Abnormal   Collection Time: 08/09/15  4:35 AM  Result Value Ref Range   Sodium 139 135 - 145 mmol/L   Potassium 4.1 3.5 - 5.1 mmol/L    Comment: DELTA CHECK NOTED   Chloride 110 101 - 111 mmol/L   CO2 22 22 - 32 mmol/L   Glucose, Bld 114 (H) 65 - 99 mg/dL   BUN 17 6 - 20 mg/dL   Creatinine, Ser 1.85 (H) 0.44 - 1.00 mg/dL   Calcium 8.4 (L) 8.9 - 10.3 mg/dL   Total Protein 7.0 6.5 - 8.1 g/dL   Albumin 2.3 (L) 3.5 - 5.0 g/dL   AST 181 (H) 15 - 41 U/L   ALT 135 (H) 14 - 54 U/L   Alkaline Phosphatase 215 (H) 38 - 126 U/L   Total Bilirubin 1.4 (H) 0.3 - 1.2 mg/dL   GFR calc non Af Amer 25 (L) >60 mL/min   GFR calc Af Amer 29 (L) >60 mL/min    Comment: (NOTE) The eGFR has been calculated using the CKD EPI equation. This calculation has not been validated in all clinical situations. eGFR's persistently <60 mL/min signify possible Chronic Kidney Disease.    Anion gap 7 5 - 15  CBC WITH DIFFERENTIAL     Status: Abnormal (Preliminary result)   Collection Time: 08/09/15  4:35 AM  Result Value Ref Range   WBC 19.3 (H) 4.0 - 10.5 K/uL    Comment: REPEATED TO VERIFY   RBC 3.41 (L) 3.87 - 5.11 MIL/uL   Hemoglobin 7.9 (L) 12.0 - 15.0 g/dL    Comment: REPEATED TO VERIFY   HCT 26.4 (L) 36.0 - 46.0 %   MCV 77.4 (L) 78.0 - 100.0 fL   MCH 23.2 (L) 26.0 - 34.0 pg   MCHC 29.9 (L) 30.0 - 36.0 g/dL   RDW 26.9 (H) 11.5 - 15.5 %   Platelets 192 150 - 400 K/uL    Comment: REPEATED TO VERIFY   Neutrophils Relative % PENDING %   Neutro Abs PENDING 1.7 - 7.7 K/uL   Band Neutrophils PENDING %   Lymphocytes Relative PENDING %   Lymphs Abs PENDING 0.7 - 4.0 K/uL   Monocytes Relative PENDING %   Monocytes Absolute PENDING 0.1 - 1.0 K/uL   Eosinophils Relative PENDING %   Eosinophils Absolute  PENDING 0.0 - 0.7 K/uL   Basophils Relative PENDING %   Basophils Absolute PENDING 0.0 - 0.1 K/uL   WBC Morphology PENDING    RBC Morphology PENDING    Smear Review PENDING    nRBC PENDING 0 /100 WBC   Metamyelocytes Relative PENDING %   Myelocytes PENDING %   Promyelocytes Absolute PENDING %   Blasts PENDING %  Prepare fresh frozen plasma     Status: None (Preliminary result)   Collection Time: 08/09/15  7:19 AM  Result Value Ref Range   Unit Number [id number]    Blood Component Type THAWED PLASMA    Unit division 00    Status of Unit ISSUED    Transfusion Status OK TO TRANSFUSE    Unit Number [id number]    Blood Component Type THAWED PLASMA    Unit division 00    Status of Unit ISSUED    Transfusion Status OK  TO TRANSFUSE     US Abdomen Port  08/08/2015  CLINICAL DATA:  Generalized abdominal pain. EXAM: ULTRASOUND PORTABLE ABDOMEN COMPARISON:  CT scan of August 06, 2015. FINDINGS: Gallbladder: Mild cholelithiasis is noted with mild gallbladder wall thickening measuring 4 mm. No sonographic Murphy's sign is noted. There is either fluid within the gallbladder wall or a small amount of pericholecystic fluid. Common bile duct: Diameter: 2.8 mm which is within normal limits. Liver: No focal lesion identified. Within normal limits in parenchymal echogenicity. Small amount of fluid is noted around the liver. IVC: No abnormality visualized. Pancreas: Not visualized due to overlying bowel gas. Spleen: Size and appearance within normal limits. Right Kidney: Length: 11.5 cm. Echogenicity within normal limits. No mass or hydronephrosis visualized. Left Kidney: Length: 11.1 cm. 9 mm cyst is seen in midpole. Echogenicity within normal limits. No mass or hydronephrosis visualized. Abdominal aorta: No aneurysm visualized. Other findings: None. IMPRESSION: Pancreas not visualized due to overlying bowel gas. Small amount of free fluid is noted around the liver. Mild cholelithiasis is noted with  mild gallbladder wall thickening. Fluid is noted either within the gallbladder wall consistent with edema, or a small amount of pericholecystic fluid is noted. Overall, these findings are concerning for acute cholecystitis, and clinical correlation and HIDA scan is recommended for further evaluation. Electronically Signed   By: Marijo Conception, M.D.   On: 08/08/2015 15:36    ROS Blood pressure 87/65, pulse 84, temperature 98.8 F (37.1 C), temperature source Oral, resp. rate 29, height 5' (1.524 m), weight 80.3 kg (177 lb 0.5 oz), SpO2 96 %. Physical Exam  Vitals reviewed. Constitutional: She is oriented to person, place, and time. She appears well-developed and well-nourished.  HENT:  Head: Normocephalic and atraumatic.  Eyes: Conjunctivae are normal. Pupils are equal, round, and reactive to light.  Neck: Normal range of motion. Neck supple.  Cardiovascular: Normal rate.   Respiratory: Effort normal and breath sounds normal. She exhibits no tenderness.  GI: Normal appearance. There is tenderness in the right upper quadrant, right lower quadrant, epigastric area and left upper quadrant. There is guarding and positive Murphy's sign (equivocal Murphy's). There is no rigidity.    Musculoskeletal: Normal range of motion.  Neurological: She is alert and oriented to person, place, and time.  Skin: Skin is warm and dry.  Psychiatric: She has a normal mood and affect. Her behavior is normal. Judgment and thought content normal.    Assessment/Plan: Patient potentially with acute cholecystitis, will get HIDA scan.  If HIDA is postitive cystic ductal obstruction, patient should get percutaneous drain after INR has been reversed.  Not a goo candidate for surgery  Derra Shartzer 08/09/2015, 7:42 AM

## 2015-08-09 NOTE — Progress Notes (Signed)
  HIDA scan, labs and exam support diagnosis of acute cholecystitis. Will correct INR with FFP. Repeat CT scan. Discuss perc drain with GSU and IR in am. Keep NPO.   [name], Daniel,MD 8:36 PM

## 2015-08-09 NOTE — Progress Notes (Signed)
CRITICAL VALUE ALERT  Critical value received:  INR 5.32  Date of notification: 08/09/2015  Time of notification:  0638  Critical value read back:Yes.    Nurse who received alert:  Hadassah Pais RN  MD notified (1st page):  Dr Debara Pickett   Time of first page:  [phone]   Responding MD:  Dr Debara Pickett   Time MD responded:  [phone]

## 2015-08-09 NOTE — Progress Notes (Signed)
HeartMate 2 Rounding Note  Subjective:    Had abdominal US concerning for cholecystitis. Plan for HIDA scan this morning. Yesterday she was started on Neo. Maps 70s. Transferred to ICU. TMax 99.8 WBC coming down to 24>19.   Complaining of abdominal pain. Denies SOB.    INR today 5.32.[name] AST 181 ALT 135    LVAD INTERROGATION:  HeartMate II LVAD:  Flow 5.6 liters/min, speed 9200 , power 5.5, PI 6.7 .  Rare PI events.   Objective:    Vital Signs:   Temp:  [98.5 F (36.9 C)-99.8 F (37.7 C)] 98.8 F (37.1 C) (12/07 0400) Pulse Rate:  [84-95] 84 (12/06 1725) Resp:  [18-35] 29 (12/07 0400) BP: (73-90)/(56-76) 87/65 mmHg (12/07 0400) SpO2:  [87 %-100 %] 96 % (12/07 0400) Weight:  [177 lb 0.5 oz (80.3 kg)] 177 lb 0.5 oz (80.3 kg) (12/07 0500) Last BM Date: 08/08/15 Mean arterial Pressure 70s on neo  Intake/Output:   Intake/Output Summary (Last 24 hours) at 08/09/15 0702 Last data filed at 08/09/15 0600  Gross per 24 hour  Intake 1496.03 ml  Output   1250 ml  Net 246.03 ml     Physical Exam: General:  Lying in bed. Fatigued appearing. No resp difficulty HEENT: normal Neck: supple. JVP flat . Carotids 2+ bilat; no bruits. No lymphadenopathy or thryomegaly appreciated. Cor: Mechanical heart sounds with LVAD hum present. Irregular Lungs: clear Abdomen: soft, RUQ /'RLQ abdominal pain and guarding. LVAD driveline dressing. No erythema or drainage nondistended. No hepatosplenomegaly. No bruits or masses. Sluggish bowel sounds.  Driveline: C/D/I; securement device intact and driveline incorporated Extremities: no cyanosis, clubbing, rash, edema Neuro: alert & orientedx3, cranial nerves grossly intact. moves all 4 extremities w/o difficulty. Affect pleasant  Telemetry: NSR 70s  Labs: Basic Metabolic Panel:  Recent Labs Lab 08/06/15 1821 08/07/15 0307 08/08/15 0320 08/09/15 0435  NA 140 138 140 139  K 3.1* 4.2 3.4* 4.1  CL 106 107 110 110  CO2  --  24 22 22    GLUCOSE 137* 104* 111* 114*  BUN 22* 14 17 17   CREATININE 1.80* 1.18* 1.82* 1.85*  CALCIUM  --  8.4* 8.6* 8.4*    Liver Function Tests:  Recent Labs Lab 08/07/15 0307 08/08/15 0320 08/09/15 0435  AST 27 39 181*  ALT 15 27 135*  ALKPHOS 73 70 215*  BILITOT 0.6 0.5 1.4*  PROT 7.6 7.7 7.0  ALBUMIN 3.1* 2.7* 2.3*   No results for input(s): LIPASE, AMYLASE in the last 168 hours. No results for input(s): AMMONIA in the last 168 hours.  CBC:  Recent Labs Lab 08/06/15 1810 08/06/15 1821 08/06/15 2044 08/07/15 0307 08/08/15 0320 08/09/15 0435  WBC 8.2  --  7.9 8.4 24.1* 19.3*  NEUTROABS 5.3  --  6.3 6.8 22.0* PENDING  HGB 8.1* 10.2* 7.6* 7.9* 7.4* 7.9*  HCT 26.4* 30.0* 25.1* 26.2* 24.7* 26.4*  MCV 76.7*  --  77.0* 78.4 78.4 77.4*  PLT 245  --  221 223 203 192    INR:  Recent Labs Lab 08/06/15 1810 08/07/15 0307 08/08/15 0320 08/09/15 0435  INR 2.54* 2.59* 3.60* 5.32*    Other results:    Imaging: US Abdomen Port  08/08/2015  CLINICAL DATA:  Generalized abdominal pain. EXAM: ULTRASOUND PORTABLE ABDOMEN COMPARISON:  CT scan of August 06, 2015. FINDINGS: Gallbladder: Mild cholelithiasis is noted with mild gallbladder wall thickening measuring 4 mm. No sonographic Murphy's sign is noted. There is either fluid within the gallbladder wall  or a small amount of pericholecystic fluid. Common bile duct: Diameter: 2.8 mm which is within normal limits. Liver: No focal lesion identified. Within normal limits in parenchymal echogenicity. Small amount of fluid is noted around the liver. IVC: No abnormality visualized. Pancreas: Not visualized due to overlying bowel gas. Spleen: Size and appearance within normal limits. Right Kidney: Length: 11.5 cm. Echogenicity within normal limits. No mass or hydronephrosis visualized. Left Kidney: Length: 11.1 cm. 9 mm cyst is seen in midpole. Echogenicity within normal limits. No mass or hydronephrosis visualized. Abdominal aorta: No aneurysm  visualized. Other findings: None. IMPRESSION: Pancreas not visualized due to overlying bowel gas. Small amount of free fluid is noted around the liver. Mild cholelithiasis is noted with mild gallbladder wall thickening. Fluid is noted either within the gallbladder wall consistent with edema, or a small amount of pericholecystic fluid is noted. Overall, these findings are concerning for acute cholecystitis, and clinical correlation and HIDA scan is recommended for further evaluation. Electronically Signed   By: Marijo Conception, M.D.   On: 08/08/2015 15:36     Medications:     Scheduled Medications: . amiodarone  200 mg Oral Daily  . antiseptic oral rinse  7 mL Mouth Rinse BID  . docusate sodium  100 mg Oral BID  . ferrous sulfate  325 mg Oral BID WC  . gabapentin  100 mg Oral QHS  . pantoprazole  40 mg Oral Daily  . piperacillin-tazobactam (ZOSYN)  IV  3.375 g Intravenous Q8H  . vancomycin  1,000 mg Intravenous Q24H    Infusions: . sodium chloride 10 mL/hr at 08/08/15 1500  . phenylephrine (NEO-SYNEPHRINE) Adult infusion 20 mcg/min (08/08/15 2356)    PRN Medications: acetaminophen **OR** acetaminophen, fentaNYL (SUBLIMAZE) injection, ondansetron, sodium chloride   Assessment:   1. Ab pain and nausea 2. Chronic systolic HF s/p HM II VAD 3. Acute on chronic renal failure, stage III-IV 4. HTN 5. PAF in NSR on amiodarone 6. Chronic anemia, iron deficient 7. Hypokalemia 8. Fever/nausea/vomiting 9. Anemia 10. Hypotesion   Plan/Discussion:    [place] concerning for cholecystitis. Plan for HIDA scan this morning. Currently NPO. WBC 19.3.  Continue vanc-zosyn. Cultures pending. Liver enzymes rising. General Surgery consulted.   INR 5.32. She has been off coumadin for since Monday.    On Neo at 20 mcg.   Length of Stay: 2  Amy Clegg NP-C  08/09/2015, 7:02 AM  VAD Team --- VAD ISSUES ONLY--- Pager [phone] (7am - 7am)  Advanced Heart Failure Team  Pager [phone] (M-F; 7a -  4p)  Please contact [facility] for night-coverage after hours (4p -7a ) and weekends on [email or web address]  Patient seen and examined with Darrick Grinder, NP. We discussed all aspects of the encounter. I agree with the assessment and plan as stated above.   Ab pain and guarding now more in RUQ instead of just along driveline. RUQ u/s and HIDA suggestive of chronic cholecystitis. Will reverse INR with FFP, repeat ab CT. Continue abx. Will d/w GSU and IR tomorrow about getting perc drain placed. Continue neo for bP support. VAD parameters stable.   Linsay Vogt,MD 8:31 PM

## 2015-08-09 NOTE — Progress Notes (Signed)
  Subjective: Patient examined and CT scan reviewed She has tenderness/guarding R abdomen Pain slightly improved since Sunday admission But LFTs are rising  Objective: Vital signs in last 24 hours: Temp:  [98.6 F (37 C)-100.9 F (38.3 C)] 99.3 F (37.4 C) (12/07 1820) Cardiac Rhythm:  [-] Normal sinus rhythm;Heart block (12/07 1600) Resp:  [18-41] 20 (12/07 1820) BP: (73-99)/(56-75) 90/70 mmHg (12/07 1820) SpO2:  [90 %-98 %] 95 % (12/07 1820) Weight:  [177 lb 0.5 oz (80.3 kg)] 177 lb 0.5 oz (80.3 kg) (12/07 0500)  Hemodynamic parameters for last 24 hours: CVP:  [9 mmHg] 9 mmHg  Intake/Output from previous day: 12/06 0701 - 12/07 0700 In: 1536 [P.O.:420; I.V.:766; IV Piggyback:350] Out: 1250 [Urine:1250] Intake/Output this shift: Total I/O In: 1215.5 [I.V.:415.5; Blood:500; IV Piggyback:300] Out: 200 [Urine:200]  EXAM Alert, neuro intact Lungs cleat nsr  no ectopy RUQ, RlQ with tenderness, diminished bowel sounds  Lab Results:  Recent Labs  08/08/15 0320 08/09/15 0435  WBC 24.1* 19.3*  HGB 7.4* 7.9*  HCT 24.7* 26.4*  PLT 203 192   BMET:  Recent Labs  08/08/15 0320 08/09/15 0435  NA 140 139  K 3.4* 4.1  CL 110 110  CO2 22 22  GLUCOSE 111* 114*  BUN 17 17  CREATININE 1.82* 1.85*  CALCIUM 8.6* 8.4*    PT/INR:  Recent Labs  08/09/15 1432  LABPROT 28.7*  INR 2.76*   ABG    Component Value Date/Time   PHART 7.400 04/08/2013 0448   HCO3 22.4 04/08/2013 0448   TCO2 21 08/06/2015 1821   ACIDBASEDEF 2.0 04/08/2013 0448   O2SAT 66.9 08/09/2015 1725   CBG (last 3)  No results for input(s): GLUCAP in the last 72 hours.  Assessment/Plan: S/P   VAD 2014 Agree with plan to repeat CT scan, reverse  elevated INR  with FFP Keep npo after MN- will ask gen surg to review  again      LOS: 2 days    Tharon Aquas Trigt III 08/09/2015

## 2015-08-10 ENCOUNTER — Inpatient Hospital Stay (HOSPITAL_COMMUNITY): Payer: Medicare Other

## 2015-08-10 DIAGNOSIS — I959 Hypotension, unspecified: Secondary | ICD-10-CM

## 2015-08-10 DIAGNOSIS — D649 Anemia, unspecified: Secondary | ICD-10-CM

## 2015-08-10 DIAGNOSIS — K812 Acute cholecystitis with chronic cholecystitis: Principal | ICD-10-CM

## 2015-08-10 LAB — CBC WITH DIFFERENTIAL/PLATELET
Basophils Absolute: 0 10*3/uL (ref 0.0–0.1)
Basophils Relative: 0 %
Eosinophils Absolute: 0.1 10*3/uL (ref 0.0–0.7)
Eosinophils Relative: 1 %
HCT: 21.2 % — ABNORMAL LOW (ref 36.0–46.0)
HCT: 21.8 % — ABNORMAL LOW (ref 36.0–46.0)
Hemoglobin: 6.3 g/dL — CL (ref 12.0–15.0)
Hemoglobin: 6.5 g/dL — CL (ref 12.0–15.0)
Lymphocytes Relative: 5 %
Lymphs Abs: 0.7 10*3/uL (ref 0.7–4.0)
MCH: 22.8 pg — ABNORMAL LOW (ref 26.0–34.0)
MCH: 22.9 pg — ABNORMAL LOW (ref 26.0–34.0)
MCHC: 29.7 g/dL — ABNORMAL LOW (ref 30.0–36.0)
MCHC: 29.8 g/dL — ABNORMAL LOW (ref 30.0–36.0)
MCV: 76.8 fL — ABNORMAL LOW (ref 78.0–100.0)
MCV: 76.8 fL — ABNORMAL LOW (ref 78.0–100.0)
Monocytes Absolute: 1.3 10*3/uL — ABNORMAL HIGH (ref 0.1–1.0)
Monocytes Relative: 9 %
Neutro Abs: 12 10*3/uL — ABNORMAL HIGH (ref 1.7–7.7)
Neutrophils Relative %: 85 %
Platelets: 154 10*3/uL (ref 150–400)
Platelets: 162 10*3/uL (ref 150–400)
RBC: 2.76 MIL/uL — ABNORMAL LOW (ref 3.87–5.11)
RBC: 2.84 MIL/uL — ABNORMAL LOW (ref 3.87–5.11)
RDW: 26.9 % — ABNORMAL HIGH (ref 11.5–15.5)
RDW: 26.7 % — ABNORMAL HIGH (ref 11.5–15.5)
WBC: 13.2 10*3/uL — ABNORMAL HIGH (ref 4.0–10.5)
WBC: 14.1 10*3/uL — ABNORMAL HIGH (ref 4.0–10.5)

## 2015-08-10 LAB — BASIC METABOLIC PANEL
Anion gap: 6 (ref 5–15)
BUN: 10 mg/dL (ref 6–20)
CO2: 25 mmol/L (ref 22–32)
Calcium: 7.9 mg/dL — ABNORMAL LOW (ref 8.9–10.3)
Chloride: 105 mmol/L (ref 101–111)
Creatinine, Ser: 1.43 mg/dL — ABNORMAL HIGH (ref 0.44–1.00)
GFR calc Af Amer: 40 mL/min — ABNORMAL LOW (ref >60)
GFR calc non Af Amer: 34 mL/min — ABNORMAL LOW (ref >60)
Glucose, Bld: 368 mg/dL — ABNORMAL HIGH (ref 65–99)
Potassium: 3.3 mmol/L — ABNORMAL LOW (ref 3.5–5.1)
Sodium: 136 mmol/L (ref 135–145)

## 2015-08-10 LAB — PREPARE FRESH FROZEN PLASMA
Unit division: 0
Unit division: 0
Unit division: 0
Unit division: 0

## 2015-08-10 LAB — CBC
HCT: 26.7 % — ABNORMAL LOW (ref 36.0–46.0)
Hemoglobin: 8.1 g/dL — ABNORMAL LOW (ref 12.0–15.0)
MCH: 23.7 pg — ABNORMAL LOW (ref 26.0–34.0)
MCHC: 30.3 g/dL (ref 30.0–36.0)
MCV: 78.1 fL (ref 78.0–100.0)
Platelets: 148 10*3/uL — ABNORMAL LOW (ref 150–400)
RBC: 3.42 MIL/uL — ABNORMAL LOW (ref 3.87–5.11)
RDW: 23.3 % — ABNORMAL HIGH (ref 11.5–15.5)
WBC: 12.9 10*3/uL — ABNORMAL HIGH (ref 4.0–10.5)

## 2015-08-10 LAB — COMPREHENSIVE METABOLIC PANEL
ALT: 69 U/L — ABNORMAL HIGH (ref 14–54)
AST: 51 U/L — ABNORMAL HIGH (ref 15–41)
Albumin: 2.1 g/dL — ABNORMAL LOW (ref 3.5–5.0)
Alkaline Phosphatase: 138 U/L — ABNORMAL HIGH (ref 38–126)
Anion gap: 7 (ref 5–15)
BUN: 12 mg/dL (ref 6–20)
CO2: 24 mmol/L (ref 22–32)
Calcium: 8.1 mg/dL — ABNORMAL LOW (ref 8.9–10.3)
Chloride: 105 mmol/L (ref 101–111)
Creatinine, Ser: 1.42 mg/dL — ABNORMAL HIGH (ref 0.44–1.00)
GFR calc Af Amer: 40 mL/min — ABNORMAL LOW (ref >60)
GFR calc non Af Amer: 34 mL/min — ABNORMAL LOW (ref >60)
Glucose, Bld: 172 mg/dL — ABNORMAL HIGH (ref 65–99)
Potassium: 2.8 mmol/L — ABNORMAL LOW (ref 3.5–5.1)
Sodium: 136 mmol/L (ref 135–145)
Total Bilirubin: 0.8 mg/dL (ref 0.3–1.2)
Total Protein: 6.3 g/dL — ABNORMAL LOW (ref 6.5–8.1)

## 2015-08-10 LAB — CARBOXYHEMOGLOBIN
Carboxyhemoglobin: 1.5 % (ref 0.5–1.5)
Methemoglobin: 1 % (ref 0.0–1.5)
O2 Saturation: 63 %
Total hemoglobin: 7.8 g/dL — ABNORMAL LOW (ref 12.0–16.0)

## 2015-08-10 LAB — PROTIME-INR
INR: 2.46 — ABNORMAL HIGH (ref 0.00–1.49)
INR: 2.25 — ABNORMAL HIGH (ref 0.00–1.49)
INR: 2.15 — ABNORMAL HIGH (ref 0.00–1.49)
Prothrombin Time: 26.4 seconds — ABNORMAL HIGH (ref 11.6–15.2)
Prothrombin Time: 24.6 seconds — ABNORMAL HIGH (ref 11.6–15.2)
Prothrombin Time: 23.8 seconds — ABNORMAL HIGH (ref 11.6–15.2)

## 2015-08-10 LAB — LACTATE DEHYDROGENASE: LDH: 217 U/L — ABNORMAL HIGH (ref 98–192)

## 2015-08-10 LAB — GLUCOSE, CAPILLARY: Glucose-Capillary: 81 mg/dL (ref 65–99)

## 2015-08-10 LAB — VANCOMYCIN, TROUGH: Vancomycin Tr: 11 ug/mL (ref 10.0–20.0)

## 2015-08-10 LAB — PREPARE RBC (CROSSMATCH)

## 2015-08-10 LAB — AMYLASE: Amylase: 42 U/L (ref 28–100)

## 2015-08-10 MED ORDER — SODIUM CHLORIDE 0.9 % IV SOLN: Freq: Once | INTRAVENOUS | Status: AC

## 2015-08-10 MED ORDER — VANCOMYCIN HCL IN DEXTROSE 750-5 MG/150ML-% IV SOLN
750.0000 mg | Freq: Two times a day (BID) | INTRAVENOUS | Status: DC
  Administered 2015-08-10 – 2015-08-12 (×4): 750 mg via INTRAVENOUS
  Filled 2015-08-10 (×5): qty 150

## 2015-08-10 MED ORDER — FUROSEMIDE 10 MG/ML IJ SOLN: 20.0000 mg | Freq: Once | INTRAMUSCULAR | Status: DC

## 2015-08-10 MED ORDER — MIDAZOLAM HCL 2 MG/2ML IJ SOLN
INTRAMUSCULAR | Status: AC
  Filled 2015-08-10: qty 2

## 2015-08-10 MED ORDER — IOHEXOL 300 MG/ML  SOLN
50.0000 mL | Freq: Once | INTRAMUSCULAR | Status: AC | PRN
  Administered 2015-08-10: 6 mL via INTRAVENOUS

## 2015-08-10 MED ORDER — FENTANYL CITRATE (PF) 100 MCG/2ML IJ SOLN
INTRAMUSCULAR | Status: AC
  Filled 2015-08-10: qty 2

## 2015-08-10 MED ORDER — MIDAZOLAM HCL 2 MG/2ML IJ SOLN
INTRAMUSCULAR | Status: AC | PRN
  Administered 2015-08-10: 0.5 mg via INTRAVENOUS

## 2015-08-10 MED ORDER — LIDOCAINE HCL 1 % IJ SOLN
INTRAMUSCULAR | Status: AC
  Filled 2015-08-10: qty 20

## 2015-08-10 MED ORDER — HYDROMORPHONE HCL 1 MG/ML IJ SOLN
1.0000 mg | INTRAMUSCULAR | Status: DC | PRN
  Administered 2015-08-10 – 2015-08-14 (×16): 1 mg via INTRAVENOUS
  Filled 2015-08-10 (×16): qty 1

## 2015-08-10 MED ORDER — POTASSIUM CHLORIDE 10 MEQ/50ML IV SOLN
10.0000 meq | INTRAVENOUS | Status: AC
  Administered 2015-08-10 (×6): 10 meq via INTRAVENOUS
  Filled 2015-08-10 (×6): qty 50

## 2015-08-10 MED ORDER — FENTANYL CITRATE (PF) 100 MCG/2ML IJ SOLN
INTRAMUSCULAR | Status: AC | PRN
  Administered 2015-08-10: 25 ug via INTRAVENOUS

## 2015-08-10 MED ORDER — ONDANSETRON HCL 4 MG/2ML IJ SOLN
INTRAMUSCULAR | Status: AC | PRN
  Administered 2015-08-10: 4 mg via INTRAVENOUS

## 2015-08-10 MED ORDER — FUROSEMIDE 10 MG/ML IJ SOLN
INTRAMUSCULAR | Status: AC
  Administered 2015-08-10: 20 mg
  Filled 2015-08-10: qty 2

## 2015-08-10 MED ORDER — ONDANSETRON HCL 4 MG/2ML IJ SOLN
INTRAMUSCULAR | Status: AC
  Filled 2015-08-10: qty 2

## 2015-08-10 MED ORDER — POTASSIUM CHLORIDE 10 MEQ/50ML IV SOLN
10.0000 meq | INTRAVENOUS | Status: AC
  Administered 2015-08-10 (×3): 10 meq via INTRAVENOUS
  Filled 2015-08-10 (×3): qty 50

## 2015-08-10 NOTE — Progress Notes (Signed)
HeartMate 2 Rounding Note  Subjective:    Had abdominal US concerning for cholecystitis. HIDA scan-patent common bile duct, delayed filling gallbladder ? Chronic cholecytitis.   Remains on Neo at 30mcg. WBC coming down to 24>19>14. Hemoglobin down form 7.9>6.3>6.5 . No visible source of bleeding.   Complaining of abdominal pain-- RUQ RLQ. [name] Denies SOB.      LVAD INTERROGATION:  HeartMate II LVAD:  Flow 5.0 liters/min, speed 9200 , power 5.5, PI 7.1 No PI eventss.  Rare PI events.   Objective:    Vital Signs:   Temp:  [98.3 F (36.8 C)-100.9 F (38.3 C)] 99.7 F (37.6 C) (12/08 0400) Resp:  [18-30] 20 (12/08 0600) BP: (77-103)/(63-81) 99/75 mmHg (12/08 0600) SpO2:  [86 %-100 %] 96 % (12/08 0600) Weight:  [182 lb 5.1 oz (82.7 kg)] 182 lb 5.1 oz (82.7 kg) (12/08 0450) Last BM Date: 08/08/15 Mean arterial Pressure 70-80s on neo  Intake/Output:   Intake/Output Summary (Last 24 hours) at 08/10/15 0711 Last data filed at 08/10/15 0600  Gross per 24 hour  Intake 2672.17 ml  Output   1100 ml  Net 1572.17 ml     Physical Exam: CVP 3-4  General:  Lying in bed. Fatigued appearing. No resp difficulty HEENT: normal Neck: supple. JVP flat . Carotids 2+ bilat; no bruits. No lymphadenopathy or thryomegaly appreciated. Cor: Mechanical heart sounds with LVAD hum present. Irregular Lungs: clear Abdomen: soft, RUQ /'RLQ abdominal pain and guarding. LVAD driveline dressing. No erythema or drainage nondistended. No hepatosplenomegaly. No bruits or masses. Sluggish bowel sounds.  Driveline: C/D/I; securement device intact and driveline incorporated Extremities: no cyanosis, clubbing, rash, edema. RUE PICC Neuro: alert & orientedx3, cranial nerves grossly intact. moves all 4 extremities w/o difficulty. Affect pleasant  Telemetry: NSR 70s  Labs: Basic Metabolic Panel:  Recent Labs Lab 08/06/15 1821 08/07/15 0307 08/08/15 0320 08/09/15 0435  NA 140 138 140 139  K 3.1* 4.2  3.4* 4.1  CL 106 107 110 110  CO2  --  24 22 22   GLUCOSE 137* 104* 111* 114*  BUN 22* 14 17 17   CREATININE 1.80* 1.18* 1.82* 1.85*  CALCIUM  --  8.4* 8.6* 8.4*    Liver Function Tests:  Recent Labs Lab 08/07/15 0307 08/08/15 0320 08/09/15 0435  AST 27 39 181*  ALT 15 27 135*  ALKPHOS 73 70 215*  BILITOT 0.6 0.5 1.4*  PROT 7.6 7.7 7.0  ALBUMIN 3.1* 2.7* 2.3*   No results for input(s): LIPASE, AMYLASE in the last 168 hours. No results for input(s): AMMONIA in the last 168 hours.  CBC:  Recent Labs Lab 08/06/15 2044 08/07/15 0307 08/08/15 0320 08/09/15 0435 08/10/15 0455 08/10/15 0620  WBC 7.9 8.4 24.1* 19.3* 13.2* 14.1*  NEUTROABS 6.3 6.8 22.0* 17.1*  --  PENDING  HGB 7.6* 7.9* 7.4* 7.9* 6.3* 6.5*  HCT 25.1* 26.2* 24.7* 26.4* 21.2* 21.8*  MCV 77.0* 78.4 78.4 77.4* 76.8* 76.8*  PLT 221 223 203 192 154 162    INR:  Recent Labs Lab 08/07/15 0307 08/08/15 0320 08/09/15 0435 08/09/15 1432 08/10/15 0455  INR 2.59* 3.60* 5.32* 2.76* 2.46*    Other results:    Imaging: Nm Hepatobiliary Liver Func  08/09/2015  CLINICAL DATA:  RIGHT upper quadrant pain. RIGHT lower quadrant pain. Nausea and vomiting. Elevated white blood cell count. Abnormal ultrasound with gallbladder wall thickening or pericholecystic fluid. Small gallstones present. Negative sonographic Murphy's sign. EXAM: NUCLEAR MEDICINE HEPATOBILIARY IMAGING TECHNIQUE: Sequential images of  the abdomen were obtained out to 60 minutes following intravenous administration of radiopharmaceutical. At 60 minutes, 2.0 mg IV morphine administered. Sixty additional minutes of imaging. RADIOPHARMACEUTICALS:  5.3 mCi Tc-62m  Choletec IV COMPARISON:  Ultrasound 08/08/2015, CT 12 4 16  FINDINGS: The radiotracer clears the blood pool with homogeneous uptake within the liver. Counts are excreted into the small bowel by 25 minutes. The gallbladder is not evident at 60 minutes. IV morphine was administered. There is faint  activity along the inferior margin the RIGHT hepatic lobe at the 60 and 65 minutes mark post morphine which is favored to represent subtle aafilling of the gallbladder. IMPRESSION: 1. Delayed filling of the gallbladder after morphine administration indicates patent cystic duct. 2. Markedly delayed filling of the gallbladder could indicate chronic cholecystitis. 3. Patent common bile duct. These results will be called to the ordering clinician or representative by the Radiologist Assistant, and communication documented in the PACS or zVision Dashboard. Electronically Signed   By: Suzy Bouchard M.D.   On: 08/09/2015 11:22   US Abdomen Port  08/08/2015  CLINICAL DATA:  Generalized abdominal pain. EXAM: ULTRASOUND PORTABLE ABDOMEN COMPARISON:  CT scan of August 06, 2015. FINDINGS: Gallbladder: Mild cholelithiasis is noted with mild gallbladder wall thickening measuring 4 mm. No sonographic Murphy's sign is noted. There is either fluid within the gallbladder wall or a small amount of pericholecystic fluid. Common bile duct: Diameter: 2.8 mm which is within normal limits. Liver: No focal lesion identified. Within normal limits in parenchymal echogenicity. Small amount of fluid is noted around the liver. IVC: No abnormality visualized. Pancreas: Not visualized due to overlying bowel gas. Spleen: Size and appearance within normal limits. Right Kidney: Length: 11.5 cm. Echogenicity within normal limits. No mass or hydronephrosis visualized. Left Kidney: Length: 11.1 cm. 9 mm cyst is seen in midpole. Echogenicity within normal limits. No mass or hydronephrosis visualized. Abdominal aorta: No aneurysm visualized. Other findings: None. IMPRESSION: Pancreas not visualized due to overlying bowel gas. Small amount of free fluid is noted around the liver. Mild cholelithiasis is noted with mild gallbladder wall thickening. Fluid is noted either within the gallbladder wall consistent with edema, or a small amount of  pericholecystic fluid is noted. Overall, these findings are concerning for acute cholecystitis, and clinical correlation and HIDA scan is recommended for further evaluation. Electronically Signed   By: Marijo Conception, M.D.   On: 08/08/2015 15:36   Dg Chest Port 1 View  08/09/2015  CLINICAL DATA:  Central catheter placement EXAM: PORTABLE CHEST 1 VIEW COMPARISON:  August 06, 2015 FINDINGS: Central catheter tip is in the superior vena cava. No pneumothorax. Pacemaker is present with lead tips attached to the right atrium and right ventricle. There is a left ventricular assist device present. There is atelectatic change in the left base. The lungs are otherwise clear. The heart is mildly enlarged with pulmonary vascularity within normal limits. No adenopathy. IMPRESSION: Central catheter tip in superior vena cava. No pneumothorax. Left base atelectasis. Lungs elsewhere clear. Heart mildly enlarged with left ventricular assist device present. Pacemaker leads attached to right atrium and right ventricle. Electronically Signed   By: Lowella Grip III M.D.   On: 08/09/2015 17:18     Medications:     Scheduled Medications: . amiodarone  200 mg Oral Daily  . antiseptic oral rinse  7 mL Mouth Rinse BID  . docusate sodium  100 mg Oral BID  . ferrous sulfate  325 mg Oral BID WC  . gabapentin  100 mg Oral QHS  . pantoprazole  40 mg Oral Daily  . piperacillin-tazobactam (ZOSYN)  IV  3.375 g Intravenous Q8H  . sodium chloride  10-40 mL Intracatheter Q12H  . vancomycin  1,000 mg Intravenous Q24H    Infusions: . sodium chloride Stopped (08/09/15 2300)  . dextrose 5 % and 0.45% NaCl 50 mL/hr at 08/09/15 2349  . phenylephrine (NEO-SYNEPHRINE) Adult infusion 20 mcg/min (08/10/15 0200)    PRN Medications: acetaminophen **OR** acetaminophen, fentaNYL (SUBLIMAZE) injection, ondansetron, sodium chloride, sodium chloride   Assessment:   1. Ab pain and nausea - likely acute on chronic cholecystitis 2.  Chronic systolic HF s/p HM II VAD 3. Acute on chronic renal failure, stage III-IV 4. HTN 5. PAF in NSR on amiodarone 6. Chronic anemia, iron deficient 7. Hypokalemia 8. Fever/nausea/vomiting 9. Anemia 10. Hypotesion 11. Hypokalemia  Plan/Discussion:    CVP 3-4. Remains off diuretics. Hgb down to 6.3. Give 2UPRBCs now. No visible source of bleeding. Check stool .   Requiring more pain medications. [place] concerning for cholecystitis. HIDA scan. Had repeat US this morning.   Currently NPO. WBC trending down 19>14. Continue vanc-zosyn. Cultures NGTD.  CMET pending. General Surgery following. [name]   LFTs down a little.   INR 2.25.  She has been off coumadin for since Monday.    On Neo at 20 mcg.   K 2.8 . Give 6 runs of K.   Length of Stay: 3  Amy Clegg NP-C  08/10/2015, 7:11 AM  VAD Team --- VAD ISSUES ONLY--- Pager [phone] (7am - 7am)  Advanced Heart Failure Team  Pager [phone] (M-F; 7a - 4p)  Please contact [facility] for night-coverage after hours (4p -7a ) and weekends on [email or web address]   Patient seen and examined with Darrick Grinder, NP. We discussed all aspects of the encounter. I agree with the assessment and plan as stated above.   She has become increasingly ill. Persistent nausea and worsening ab pain. Remains on neo at 20 for hypotension. Hgb down today. I reviewed ab u/s with radiology personally and there appears to be worsening GB wall inflammation concerning for acute cholecystitis. She is exquisitely tender on exam over her GB. Will continue abx. Reverse INR. Plan percutaneous cholesystotomy tube today. Wil transfuse 2u RBCs as well. Supping K+.   The patient is critically ill with multiple organ systems failure and requires high complexity decision making for assessment and support, frequent evaluation and titration of therapies, application of advanced monitoring technologies and extensive interpretation of multiple databases.   Critical Care Time devoted to patient  care services described in this note is 45 Minutes.  Gadiel John,MD 4:38 PM

## 2015-08-10 NOTE — Progress Notes (Signed)
VAD Coordinator Procedure Note:   VAD coordinator paged to IR to monitor pt during percutaneous cholecystostomy drain placement. Hemodynamics and VAD parameters monitored by me throughout the procedure. MAPs were obtained with automatic cuff. Per 2H nurse, doppler MAP correlates with auto cuff MAP.  VAD dressing and attachment device removed for Dr. Jacqualyn Posey due to close proximity of drain placement.  Sterile tegaderm dressing applied to cover exit site during procedure.        Auto cuff(MAP):   HR: Sat: Flow: PI: Power:      Speed:                Pre-procedure: 16:00   102/86 (92)  71 84 4.8 7.3 5.3  9200  O2 increased from 4 L/[place] to 6 L/[place]  16:15   101/80 (88)  69 94 4.6 7.2 5.3  Sedation Induction:  16:20   110/98 (103)  69 94 4.5 6.7 5.3  16:30   99/88 (94)  73 94 4.6 6.7 5.3   Patient tolerated the procedure well. PIs were 6 - 7 throughout the case with no power elevations. After adequate sedation was achieved, pulse ox maintained >92% throughout the remainder of the procedure. MAPs were 88 - 103.  Patient Disposition: 2H 14 1st unit FFP completed in procedure. 2nd unit FFP stopped after pt received @ 20 cc's per Dr. Clayborne Dana request.   LVAD Exit Site:  VAD exit site care performed using sterile technique. Drive line exit site cleaned with Chlora prep applicators x 2, allowed to dry, and smaller size Sorbaview dressing with biopatch re-applied. Exit site well healed and incorporated. The velour is fully implanted at exit site. No redness, tenderness, drainage, or foul odor noted. Drive line anchor re-applied.   Perc drain site cleaned and gauze dressing applied and secured with tape. Perc line and driveline exit sites are in close proximity to each other so foley anchor device used to secure perc drain as well.     [name] and Darrick Grinder, NP in to assess pt.

## 2015-08-10 NOTE — Progress Notes (Signed)
Second unit of FFP was spiked, but stopped per [name]. Released FFP second unit but not given. Pt did not receive. Checked with Devoria Glassing, RN.

## 2015-08-10 NOTE — Procedures (Signed)
Interventional Radiology Procedure Note  Procedure: Image guided perc cholecystostomy.  26F drain transhepatic.  Sample sent to the lab.   Complications: None Recommendations:  - to gravity drainage.  - Sample to the lab.  Follow up cultures. - Do not submerge  - Routine drain care   Signed,  Dulcy Fanny. Earleen Newport, DO

## 2015-08-10 NOTE — Progress Notes (Signed)
ANTIBIOTIC CONSULT NOTE - FOLLOW UP  Pharmacy Consult for Vancomycin , Zosyn Indication: suspected LVAD drive line infection  No Known Allergies  Patient Measurements: Height: 5' (152.4 cm) Weight: 182 lb 5.1 oz (82.7 kg) IBW/kg (Calculated) : 45.5 Adjusted Body Weight: n/a  Vital Signs: Temp: 98.6 F (37 C) (12/08 1306) Temp Source: Oral (12/08 1306) BP: 99/74 mmHg (12/08 1306) Pulse Rate: 65 (12/08 1200) Intake/Output from previous day: 12/07 0701 - 12/08 0700 In: 2752.2 [I.V.:1249.2; Blood:1153; IV Piggyback:350] Out: 1100 [Urine:1100] Intake/Output from this shift: Total I/O In: 749.9 [I.V.:399.9; Blood:50; IV [id number] Out: -   Labs:  Recent Labs  08/08/15 0320 08/09/15 0435 08/10/15 0455 08/10/15 0620  WBC 24.1* 19.3* 13.2* 14.1*  HGB 7.4* 7.9* 6.3* 6.5*  PLT 203 192 154 162  CREATININE 1.82* 1.85*  --  1.42*   Estimated Creatinine Clearance: 31.1 mL/min (by C-G formula based on Cr of 1.42).  Recent Labs  08/10/15 1100  [place] 11     Microbiology: Recent Results (from the past 720 hour(s))  MRSA PCR Screening     Status: None   Collection Time: 08/06/15  9:29 PM  Result Value Ref Range Status   MRSA by PCR NEGATIVE NEGATIVE Final    Comment:        The GeneXpert MRSA Assay (FDA approved for NASAL specimens only), is one component of a comprehensive MRSA colonization surveillance program. It is not intended to diagnose MRSA infection nor to guide or monitor treatment for MRSA infections.   Culture, blood (routine x 2)     Status: None (Preliminary result)   Collection Time: 08/07/15  9:35 AM  Result Value Ref Range Status   Specimen Description BLOOD RIGHT ANTECUBITAL  Final   Special Requests BOTTLES DRAWN AEROBIC AND ANAEROBIC 10CCS  Final   Culture NO GROWTH 3 DAYS  Final   Report Status PENDING  Incomplete  Culture, blood (routine x 2)     Status: None (Preliminary result)   Collection Time: 08/07/15  9:45 AM  Result Value  Ref Range Status   Specimen Description BLOOD RIGHT HAND  Final   Special Requests BOTTLES DRAWN AEROBIC AND ANAEROBIC 5CCS  Final   Culture NO GROWTH 3 DAYS  Final   Report Status PENDING  Incomplete    Anti-infectives    Start     Dose/Rate Route Frequency Ordered Stop   08/10/15 2330  vancomycin (VANCOCIN) IVPB 750 mg/150 ml premix     750 mg 150 mL/hr over 60 Minutes Intravenous Every 12 hours 08/10/15 1340     08/08/15 0130  piperacillin-tazobactam (ZOSYN) IVPB 3.375 g     3.375 g 12.5 mL/hr over 240 Minutes Intravenous Every 8 hours 08/07/15 1722     08/07/15 1730  piperacillin-tazobactam (ZOSYN) IVPB 3.375 g     3.375 g 100 mL/hr over 30 Minutes Intravenous  Once 08/07/15 1722 08/07/15 1820   08/07/15 1100  vancomycin (VANCOCIN) IVPB 1000 mg/200 mL premix  Status:  Discontinued     1,000 mg 200 mL/hr over 60 Minutes Intravenous Every 24 hours 08/07/15 1002 08/10/15 1339   08/06/15 2015  cephALEXin (KEFLEX) capsule 500 mg  Status:  Discontinued     500 mg Oral 4 times per day 08/06/15 2004 08/07/15 1319      Assessment: 80 yo female admitted with tender driveline site.  Low grade temp 99.5> spike 102> remained low grade101, wbc 14.1 On admit started on keflex ? Drive line infection> vanc [name] - concern for  intraabdominal infection cant find source -initial CT neg [place] possible acute chole> HIDA neg acute chole> recheck CT   12/4 pm keflex > 12/5  12/5 vanc> 12/8 VT = 11 on 1g q 24 hrs  12/5 Zosyn>  12/4 UA clear 12/5 Bcx ngtd  Vancomycin trough = 11 on 1g IV q 24 hrs  Goal of Therapy:  Vancomycin trough level 15-20 mcg/ml  Plan:  1. Change vancomycin to 750 mg IV q 12 hrs for now.  Will need to watch renal function closely and adjust dose accordingly. 2. Continue Zosyn 3.375 g IV q 8 hrs (extended interval infusion) 3. Watch cultures, renal function and clinical course.  Uvaldo Rising, BCPS  Clinical Pharmacist Pager [phone]  08/10/2015 1:43  PM

## 2015-08-10 NOTE — Progress Notes (Signed)
[facility] Progress Note    Subjective: Patient still having abdominal pain.  Sore in thr RUQ and RLq and LLQ.  Sort of all over.  Has bowel sounds.  Vomited continuously all night.  Objective: Vital signs in last 24 hours: Temp:  [98.3 F (36.8 C)-99.8 F (37.7 C)] 99 F (37.2 C) (12/08 0800) Pulse Rate:  [66] 66 (12/08 0800) Resp:  [18-30] 20 (12/08 0800) BP: (77-103)/(63-81) 99/79 mmHg (12/08 0800) SpO2:  [86 %-100 %] 95 % (12/08 0800) Weight:  [82.7 kg (182 lb 5.1 oz)] 82.7 kg (182 lb 5.1 oz) (12/08 0450) Last BM Date: 08/08/15  Intake/Output from previous day: 12/07 0701 - 12/08 0700 In: 2672.2 [I.V.:1169.2; UH:[id number]; IV Piggyback:350] Out: 1100 [Urine:1100] Intake/Output this shift:    General: Sore all over her abdomen  Lungs: Clear  Abd: Not rigid.  No rebound or peritonitis.   Extremities: The same  Neuro: Intact  Lab Results:  @LABLAST2 (wbc:2,hgb:2,hct:2,plt:2) BMET ) Recent Labs  08/09/15 0435 08/10/15 0620  NA 139 136  K 4.1 2.8*  CL 110 105  CO2 22 24  GLUCOSE 114* 172*  BUN 17 12  CREATININE 1.85* 1.42*  CALCIUM 8.4* 8.1*   PT/INR  Recent Labs  08/10/15 0455 08/10/15 0705  LABPROT 26.4* 24.6*  INR 2.46* 2.25*   ABG No results for input(s): PHART, HCO3 in the last 72 hours.  Invalid input(s): PCO2, PO2  Studies/Results: Nm Hepatobiliary Liver Func  08/09/2015  CLINICAL DATA:  RIGHT upper quadrant pain. RIGHT lower quadrant pain. Nausea and vomiting. Elevated white blood cell count. Abnormal ultrasound with gallbladder wall thickening or pericholecystic fluid. Small gallstones present. Negative sonographic Murphy's sign. EXAM: NUCLEAR MEDICINE HEPATOBILIARY IMAGING TECHNIQUE: Sequential images of the abdomen were obtained out to 60 minutes following intravenous administration of radiopharmaceutical. At 60 minutes, 2.0 mg IV morphine administered. Sixty additional minutes of imaging. RADIOPHARMACEUTICALS:  5.3 mCi Tc-90m  Choletec IV  COMPARISON:  Ultrasound 08/08/2015, CT 12 4 16  FINDINGS: The radiotracer clears the blood pool with homogeneous uptake within the liver. Counts are excreted into the small bowel by 25 minutes. The gallbladder is not evident at 60 minutes. IV morphine was administered. There is faint activity along the inferior margin the RIGHT hepatic lobe at the 60 and 65 minutes mark post morphine which is favored to represent subtle aafilling of the gallbladder. IMPRESSION: 1. Delayed filling of the gallbladder after morphine administration indicates patent cystic duct. 2. Markedly delayed filling of the gallbladder could indicate chronic cholecystitis. 3. Patent common bile duct. These results will be called to the ordering clinician or representative by the Radiologist Assistant, and communication documented in the PACS or zVision Dashboard. Electronically Signed   By: Suzy Bouchard M.D.   On: 08/09/2015 11:22   US Abdomen Port  08/08/2015  CLINICAL DATA:  Generalized abdominal pain. EXAM: ULTRASOUND PORTABLE ABDOMEN COMPARISON:  CT scan of August 06, 2015. FINDINGS: Gallbladder: Mild cholelithiasis is noted with mild gallbladder wall thickening measuring 4 mm. No sonographic Murphy's sign is noted. There is either fluid within the gallbladder wall or a small amount of pericholecystic fluid. Common bile duct: Diameter: 2.8 mm which is within normal limits. Liver: No focal lesion identified. Within normal limits in parenchymal echogenicity. Small amount of fluid is noted around the liver. IVC: No abnormality visualized. Pancreas: Not visualized due to overlying bowel gas. Spleen: Size and appearance within normal limits. Right Kidney: Length: 11.5 cm. Echogenicity within normal limits. No mass or hydronephrosis visualized. Left Kidney: Length: 11.1  cm. 9 mm cyst is seen in midpole. Echogenicity within normal limits. No mass or hydronephrosis visualized. Abdominal aorta: No aneurysm visualized. Other findings: None.  IMPRESSION: Pancreas not visualized due to overlying bowel gas. Small amount of free fluid is noted around the liver. Mild cholelithiasis is noted with mild gallbladder wall thickening. Fluid is noted either within the gallbladder wall consistent with edema, or a small amount of pericholecystic fluid is noted. Overall, these findings are concerning for acute cholecystitis, and clinical correlation and HIDA scan is recommended for further evaluation. Electronically Signed   By: Marijo Conception, M.D.   On: 08/08/2015 15:36   Dg Chest Port 1 View  08/10/2015  CLINICAL DATA:  Shortness of breath. EXAM: PORTABLE CHEST 1 VIEW COMPARISON:  08/09/2015. FINDINGS: Right PICC line in stable position. Prior CABG. Cardiac pacer and left ventricular assist device in stable position. Persistent low lung volumes with bibasilar atelectasis. Small bilateral pleural effusions cannot be excluded. No pneumothorax. IMPRESSION: 1. Right PICC line in stable position. 2. Prior CABG. Cardiac pacer and left ventricular assist device in stable position. Stable cardiomegaly. No pulmonary venous congestion. 3. Persistent low lung volumes with bibasilar atelectasis. Small bilateral pleural effusions cannot be excluded. No pneumothorax. Electronically Signed   By: Marcello Moores  Register   On: 08/10/2015 07:20   Dg Chest Port 1 View  08/09/2015  CLINICAL DATA:  Central catheter placement EXAM: PORTABLE CHEST 1 VIEW COMPARISON:  August 06, 2015 FINDINGS: Central catheter tip is in the superior vena cava. No pneumothorax. Pacemaker is present with lead tips attached to the right atrium and right ventricle. There is a left ventricular assist device present. There is atelectatic change in the left base. The lungs are otherwise clear. The heart is mildly enlarged with pulmonary vascularity within normal limits. No adenopathy. IMPRESSION: Central catheter tip in superior vena cava. No pneumothorax. Left base atelectasis. Lungs elsewhere clear. Heart  mildly enlarged with left ventricular assist device present. Pacemaker leads attached to right atrium and right ventricle. Electronically Signed   By: Lowella Grip III M.D.   On: 08/09/2015 17:18   US Abdomen Limited Ruq  08/10/2015  CLINICAL DATA:  80 year old female with right upper quadrant pain and nuclear medicine hepatobiliary study findings suggesting chronic cholecystitis. Cholelithiasis. Initial encounter. EXAM: US ABDOMEN LIMITED - RIGHT UPPER QUADRANT COMPARISON:  Nuclear medicine hepato biliary scan 08/09/2015. CT Abdomen and Pelvis 08/06/2015. FINDINGS: Gallbladder: Gallbladder wall thickening up to 8 mm, with widespread wall heterogeneity. Dependent sludge. Shadowing echogenic mostly gravel type gallstones. These might individually measure up to 11 mm (image 20). Trace pericholecystic fluid. No sonographic Murphy sign elicited. Common bile duct: Diameter: 4 mm, normal Liver: No intrahepatic biliary ductal dilatation. Small central hepatic cyst measuring 9 mm re - demonstrated and appears simple (image 33). No other discrete liver lesion. Other findings: Visible right kidney without hydronephrosis. Small volume perihepatic free fluid, in part contiguous with the gallbladder fossa. IMPRESSION: 1. Markedly abnormal gallbladder wall, edematous and irregular with pericholecystic fluid and small volume perihepatic free fluid. Constellation of imaging findings since 08/06/2015 are most compatible with Acute on Chronic Cholecystitis. 2. No evidence of biliary obstruction at this time. Electronically Signed   By: Genevie Ann M.D.   On: 08/10/2015 07:42    Anti-infectives: Anti-infectives    Start     Dose/Rate Route Frequency Ordered Stop   08/08/15 0130  piperacillin-tazobactam (ZOSYN) IVPB 3.375 g     3.375 g 12.5 mL/hr over 240 Minutes Intravenous Every 8  hours 08/07/15 1722     08/07/15 1730  piperacillin-tazobactam (ZOSYN) IVPB 3.375 g     3.375 g 100 mL/hr over 30 Minutes Intravenous  Once  08/07/15 1722 08/07/15 1820   08/07/15 1100  vancomycin (VANCOCIN) IVPB 1000 mg/200 mL premix     1,000 mg 200 mL/hr over 60 Minutes Intravenous Every 24 hours 08/07/15 1002     08/06/15 2015  cephALEXin (KEFLEX) capsule 500 mg  Status:  Discontinued     500 mg Oral 4 times per day 08/06/15 2004 08/07/15 1319      Assessment/Plan: s/p  Until we know exactly what we are dealing with and know that some type of intervention will be necessary, I would keep her INR in the therapeutic range.  CT of the abdomen and pelvis is okay to look for other possible sources of inflammation.  However, the gallbladder is not the problem and I would not recommend percutaneous drainage at this time.  You can ask interventional radiology their opinion about the perc drain, but I do not think she needs her GB drained. I cannot identify what is causing her current discomfort.  With her lactic acid level of 1.5, it seems unlikely to be significant bowel ischemia.  Repeating the CT may uncover a source.  She is improving on antibiotics.  LOS: 3 days   Kathryne Eriksson. Dahlia Bailiff, MD, FACS [phone] [facility] Surgery 08/10/2015

## 2015-08-10 NOTE — Consult Note (Signed)
Chief Complaint: Patient was seen in consultation today for percutaneous cholecystostomy drain placement Chief Complaint  Patient presents with  . Abdominal Pain  . Emesis   at the request of Dr Haroldine Laws  Referring Physician(s): Dr Haroldine Laws  History of Present Illness: Brianna Hanson is a 80 y.o. female    Admitted through ED 12/4 RUQ pain; N/V Work up shows cholecystitis [place] 12/8: IMPRESSION: 1. Markedly abnormal gallbladder wall, edematous and irregular with pericholecystic fluid and small volume perihepatic free fluid. Constellation of imaging findings since 08/06/2015 are most compatible with Acute on Chronic Cholecystitis. 2. No evidence of biliary obstruction at this time.  Surgical consult felt unlikely needs drain; but review of imaging and clinical status-- Dr Earleen Newport and Dr Jeffie Pollock feel best to move ahead secondary US findings; RUQ pain; leukocytosis and fever Pt now scheduled for percutaneous cholecystostomy drain placement INR 2.25 this am Now getting FFP   Past Medical History  Diagnosis Date  . Cardiac arrest - ventricular fibrillation 12/10    with successful resucitation, S/p ICD  . ICD (implantable cardiac defibrillator) in place     she has received appropriate therapy for VF  . Atrial fibrillation or flutter   . Osteopenia   . HTN (hypertension)     moderate  . Nonischemic cardiomyopathy ([place])     followed by Dr April Holding at [facility]  . CHF (congestive heart failure) ([place])   . Anemia   . Plasma cell disorder 03/20/2012  . Plasma cell disorder 03/20/2012  . Sessile colonic polyp 2011    Dr Benson Norway  . Diverticula of colon 2011  . Internal and external hemorrhoids without complication [id number]    Past Surgical History  Procedure Laterality Date  . Abdominal hysterectomy    . Cardiac catheterization    . Cardiac defibrillator placement      by JA for secondary prevention of sudden death  . Insertion of implantable left ventricular assist device  N/A 04/06/2013    Procedure: INSERTION OF IMPLANTABLE LEFT VENTRICULAR ASSIST DEVICE;  Surgeon: Gaye Pollack, MD;  Location: [place];  Service: Open Heart Surgery;  Laterality: N/A;  . Placement of centrimag ventricular assist device Right 04/06/2013    Procedure: PLACEMENT OF CENTRIMAG VENTRICULAR ASSIST DEVICE;  Surgeon: Gaye Pollack, MD;  Location: [place];  Service: Open Heart Surgery;  Laterality: Right;  . Intraoperative transesophageal echocardiogram N/A 04/06/2013    Procedure: INTRAOPERATIVE TRANSESOPHAGEAL ECHOCARDIOGRAM;  Surgeon: Gaye Pollack, MD;  Location[facility] OR;  Service: Open Heart Surgery;  Laterality: N/A;  . Colonoscopy w/ polypectomy  2011    Dr Benson Norway    Allergies: Review of patient's allergies indicates no known allergies.  Medications: Prior to Admission medications   Medication Sig Start Date End Date Taking? Authorizing Provider  amiodarone (PACERONE) 200 MG tablet TAKE 1 TABLET BY MOUTH EVERY DAY 07/26/15  Yes Thompson Grayer, MD  amLODipine (NORVASC) 10 MG tablet TAKE 1 TABLET BY MOUTH TWICE A DAY 03/28/15  Yes Shaune Pascal [name], MD  carvedilol (COREG) 12.5 MG tablet TAKE 1 & 1/2 TABS TWICE DAILY 05/15/15  Yes Jolaine Artist, MD  Cholecalciferol (VITAMIN D3) 1000 UNITS CAPS Take 1 tablet by mouth daily.     Yes Historical Provider, MD  docusate sodium (COLACE) 100 MG capsule Take 1 capsule (100 mg total) by mouth 2 (two) times daily. 07/11/15  Yes Jolaine Artist, MD  ferrous sulfate 325 (65 FE) MG tablet Take 1 tablet (325 mg total) by  mouth 2 (two) times daily with a meal. 07/11/15  Yes Jolaine Artist, MD  gabapentin (NEURONTIN) 100 MG capsule TAKE 1 CAPSULE BY MOUTH AT BEDTIME OR AS DIRECTED 02/07/15  Yes Amy D Clegg, NP  hydrALAZINE (APRESOLINE) 100 MG tablet TAKE 1 TABLET BY MOUTH 3 TIMES A DAY 05/04/15  Yes Shaune Pascal [name], MD  losartan (COZAAR) 50 MG tablet TAKE 1 TABLET BY MOUTH EVERY DAY 05/15/15  Yes Amy D Clegg, NP  pantoprazole (PROTONIX) 40 MG tablet TAKE  1 TABLET BY MOUTH EVERY DAY 07/25/15  Yes Jolaine Artist, MD  spironolactone (ALDACTONE) 25 MG tablet TAKE 1 TABLET BY MOUTH EVERY DAY 07/25/15  Yes Jolaine Artist, MD  torsemide (DEMADEX) 20 MG tablet Take 1 tablet (20 mg total) by mouth daily as needed. weight gain 3 lbs in 24 hours 05/11/15  Yes Jolaine Artist, MD  traMADol (ULTRAM) 50 MG tablet Take 1 tablet (50 mg total) by mouth every 6 (six) hours as needed for moderate pain. for pain 05/11/15  Yes Jolaine Artist, MD  warfarin (COUMADIN) 2.5 MG tablet Take 2.5-5 mg by mouth daily. 2.5mg  daily except 5mg  on Wednesdays   Yes Historical Provider, MD     Family History  Problem Relation Age of Onset  . Stroke Brother   . Stroke Mother     Social History   Social History  . Marital Status: Single    Spouse Name: N/A  . Number of Children: 3  . Years of Education: N/A   Social History Main Topics  . Smoking status: Never Smoker   . Smokeless tobacco: Never Used     Comment: remote history of tobacco abuse  . Alcohol Use: No     Comment: occsionally wine  . Drug Use: No  . Sexual Activity: Not Currently    Birth Control/ Protection: Surgical   Other Topics Concern  . None   Social History Narrative   Lives with spouse who was recently diagnosed with esophageal ca and is receiving chemotherapy     Review of Systems: A 12 point ROS discussed and pertinent positives are indicated in the HPI above.  All other systems are negative.  Review of Systems  Constitutional: Positive for fever, diaphoresis, activity change, appetite change and fatigue.  Respiratory: Positive for shortness of breath. Negative for chest tightness.   Gastrointestinal: Positive for nausea, vomiting, abdominal pain and abdominal distention.  Neurological: Positive for weakness.  Psychiatric/Behavioral: Negative for behavioral problems and confusion.    Vital Signs: BP 96/84 mmHg  Pulse 65  Temp(Src) 99 F (37.2 C) (Oral)  Resp 17  Ht  5' (1.524 m)  Wt 182 lb 5.1 oz (82.7 kg)  BMI 35.61 kg/m2  SpO2 92%  Physical Exam  Constitutional: She is oriented to person, place, and time.  Cardiovascular: Normal rate and regular rhythm.   Pulmonary/Chest: Effort normal. She has no wheezes.  Abdominal: Soft. Bowel sounds are normal. She exhibits distension. There is tenderness.  Musculoskeletal: Normal range of motion.  Neurological: She is alert and oriented to person, place, and time.  Skin: Skin is warm and dry.  Psychiatric: She has a normal mood and affect. Her behavior is normal. Judgment and thought content normal.  Nursing note and vitals reviewed.   Mallampati Score:  MD Evaluation Airway: WNL Heart: WNL Abdomen: WNL Chest/ Lungs: WNL ASA  Classification: 3 Mallampati/Airway Score: Two  Imaging: Nm Hepatobiliary Liver Func  08/09/2015  CLINICAL DATA:  RIGHT upper quadrant  pain. RIGHT lower quadrant pain. Nausea and vomiting. Elevated white blood cell count. Abnormal ultrasound with gallbladder wall thickening or pericholecystic fluid. Small gallstones present. Negative sonographic Murphy's sign. EXAM: NUCLEAR MEDICINE HEPATOBILIARY IMAGING TECHNIQUE: Sequential images of the abdomen were obtained out to 60 minutes following intravenous administration of radiopharmaceutical. At 60 minutes, 2.0 mg IV morphine administered. Sixty additional minutes of imaging. RADIOPHARMACEUTICALS:  5.3 mCi Tc-87m  Choletec IV COMPARISON:  Ultrasound 08/08/2015, CT 12 4 16  FINDINGS: The radiotracer clears the blood pool with homogeneous uptake within the liver. Counts are excreted into the small bowel by 25 minutes. The gallbladder is not evident at 60 minutes. IV morphine was administered. There is faint activity along the inferior margin the RIGHT hepatic lobe at the 60 and 65 minutes mark post morphine which is favored to represent subtle aafilling of the gallbladder. IMPRESSION: 1. Delayed filling of the gallbladder after morphine  administration indicates patent cystic duct. 2. Markedly delayed filling of the gallbladder could indicate chronic cholecystitis. 3. Patent common bile duct. These results will be called to the ordering clinician or representative by the Radiologist Assistant, and communication documented in the PACS or zVision Dashboard. Electronically Signed   By: Suzy Bouchard M.D.   On: 08/09/2015 11:22   Ct Abdomen Pelvis W Contrast  08/06/2015  CLINICAL DATA:  Sudden onset of lower abdominal pain and vomiting today. Patient has a left ventricular assist device. EXAM: CT ABDOMEN AND PELVIS WITH CONTRAST TECHNIQUE: Multidetector CT imaging of the abdomen and pelvis was performed using the standard protocol following bolus administration of intravenous contrast. CONTRAST:  59mL OMNIPAQUE IOHEXOL 300 MG/ML  SOLN COMPARISON:  CT scan 04/18/2013 FINDINGS: Lower chest: The lung bases demonstrate dependent vascular crowding and atelectasis. No definite infiltrates. The heart is enlarged. A left ventricular assist device is again noted with moderate artifact. No complicating features are demonstrated. Hepatobiliary: Small calcified granuloma and small hepatic cysts noted. No worrisome lesions. The gallbladder demonstrates dense layering gallstones and mild distension. No inflammation. No common bile duct dilatation. Pancreas: No mass, inflammation or ductal dilatation. Spleen: Normal size.  No focal lesions. Adrenals/Urinary Tract: The adrenal glands are normal. Small renal cysts but no worrisome renal lesions or hydronephrosis. Stomach/Bowel: The stomach, duodenum, small bowel and colon are grossly normal. No inflammatory changes, mass lesions or obstructive findings. Scattered colonic diverticulosis without findings for acute diverticulitis. Vascular/Lymphatic: No mesenteric or retroperitoneal mass or adenopathy. The aorta is normal in caliber. Moderate scattered atherosclerotic calcifications. The branch vessels are patent.  The major venous structures are patent. Reproductive: The uterus is surgically absent. The ovaries are not identified for certain. Other: No pelvic mass or adenopathy. No free pelvic fluid collections. No inguinal mass or adenopathy. No abdominal wall hernia or subcutaneous lesions. Musculoskeletal: Degenerative changes involving the lumbar spine but no acute bony abnormality. IMPRESSION: 1. No acute abdominal/pelvic findings, mass lesions or adenopathy. 2. Cholelithiasis.  No definite CT findings for acute cholecystitis. 3. Left ventricular assist device without complicating features. Electronically Signed   By: Marijo Sanes M.D.   On: 08/06/2015 19:22   US Abdomen Port  08/08/2015  CLINICAL DATA:  Generalized abdominal pain. EXAM: ULTRASOUND PORTABLE ABDOMEN COMPARISON:  CT scan of August 06, 2015. FINDINGS: Gallbladder: Mild cholelithiasis is noted with mild gallbladder wall thickening measuring 4 mm. No sonographic Murphy's sign is noted. There is either fluid within the gallbladder wall or a small amount of pericholecystic fluid. Common bile duct: Diameter: 2.8 mm which is within normal limits. Liver:  No focal lesion identified. Within normal limits in parenchymal echogenicity. Small amount of fluid is noted around the liver. IVC: No abnormality visualized. Pancreas: Not visualized due to overlying bowel gas. Spleen: Size and appearance within normal limits. Right Kidney: Length: 11.5 cm. Echogenicity within normal limits. No mass or hydronephrosis visualized. Left Kidney: Length: 11.1 cm. 9 mm cyst is seen in midpole. Echogenicity within normal limits. No mass or hydronephrosis visualized. Abdominal aorta: No aneurysm visualized. Other findings: None. IMPRESSION: Pancreas not visualized due to overlying bowel gas. Small amount of free fluid is noted around the liver. Mild cholelithiasis is noted with mild gallbladder wall thickening. Fluid is noted either within the gallbladder wall consistent with edema,  or a small amount of pericholecystic fluid is noted. Overall, these findings are concerning for acute cholecystitis, and clinical correlation and HIDA scan is recommended for further evaluation. Electronically Signed   By: Marijo Conception, M.D.   On: 08/08/2015 15:36   Dg Chest Port 1 View  08/10/2015  CLINICAL DATA:  Shortness of breath. EXAM: PORTABLE CHEST 1 VIEW COMPARISON:  08/09/2015. FINDINGS: Right PICC line in stable position. Prior CABG. Cardiac pacer and left ventricular assist device in stable position. Persistent low lung volumes with bibasilar atelectasis. Small bilateral pleural effusions cannot be excluded. No pneumothorax. IMPRESSION: 1. Right PICC line in stable position. 2. Prior CABG. Cardiac pacer and left ventricular assist device in stable position. Stable cardiomegaly. No pulmonary venous congestion. 3. Persistent low lung volumes with bibasilar atelectasis. Small bilateral pleural effusions cannot be excluded. No pneumothorax. Electronically Signed   By: Marcello Moores  Register   On: 08/10/2015 07:20   Dg Chest Port 1 View  08/09/2015  CLINICAL DATA:  Central catheter placement EXAM: PORTABLE CHEST 1 VIEW COMPARISON:  August 06, 2015 FINDINGS: Central catheter tip is in the superior vena cava. No pneumothorax. Pacemaker is present with lead tips attached to the right atrium and right ventricle. There is a left ventricular assist device present. There is atelectatic change in the left base. The lungs are otherwise clear. The heart is mildly enlarged with pulmonary vascularity within normal limits. No adenopathy. IMPRESSION: Central catheter tip in superior vena cava. No pneumothorax. Left base atelectasis. Lungs elsewhere clear. Heart mildly enlarged with left ventricular assist device present. Pacemaker leads attached to right atrium and right ventricle. Electronically Signed   By: Lowella Grip III M.D.   On: 08/09/2015 17:18   Dg Chest Portable 1 View  08/06/2015  CLINICAL DATA:   Abdominal pain and vomiting. EXAM: PORTABLE CHEST 1 VIEW COMPARISON:  11/09/2014 FINDINGS: Left ventricular assist device is stable. Dual lead cardiac pacemaker is also in stable position. Median sternotomy wires are intact. Cardiomediastinal silhouette is mildly enlarged. Mediastinal contours appear intact. There is no evidence of focal airspace consolidation, pleural effusion or pneumothorax. There is mild prominence of the interstitial markings. Osseous structures are without acute abnormality. Soft tissues are grossly normal. IMPRESSION: Mild prominence of the interstitial markings which may be seen with pulmonary vascular congestion. Mild cardiomegaly. Electronically Signed   By: Fidela Salisbury M.D.   On: 08/06/2015 19:00   US Abdomen Limited Ruq  08/10/2015  ADDENDUM REPORT: 08/10/2015 09:22 ADDENDUM: Study discussed by telephone with [name] On 08/10/2015 at 0913 hours. Electronically Signed   By: Genevie Ann M.D.   On: 08/10/2015 09:22  08/10/2015  CLINICAL DATA:  80 year old female with right upper quadrant pain and nuclear medicine hepatobiliary study findings suggesting chronic cholecystitis. Cholelithiasis. Initial encounter. EXAM: US  ABDOMEN LIMITED - RIGHT UPPER QUADRANT COMPARISON:  Nuclear medicine hepato biliary scan 08/09/2015. CT Abdomen and Pelvis 08/06/2015. FINDINGS: Gallbladder: Gallbladder wall thickening up to 8 mm, with widespread wall heterogeneity. Dependent sludge. Shadowing echogenic mostly gravel type gallstones. These might individually measure up to 11 mm (image 20). Trace pericholecystic fluid. No sonographic Murphy sign elicited. Common bile duct: Diameter: 4 mm, normal Liver: No intrahepatic biliary ductal dilatation. Small central hepatic cyst measuring 9 mm re - demonstrated and appears simple (image 33). No other discrete liver lesion. Other findings: Visible right kidney without hydronephrosis. Small volume perihepatic free fluid, in part contiguous with the  gallbladder fossa. IMPRESSION: 1. Markedly abnormal gallbladder wall, edematous and irregular with pericholecystic fluid and small volume perihepatic free fluid. Constellation of imaging findings since 08/06/2015 are most compatible with Acute on Chronic Cholecystitis. 2. No evidence of biliary obstruction at this time. Electronically Signed: By: Genevie Ann M.D. On: 08/10/2015 07:42    Labs:  CBC:  Recent Labs  08/08/15 0320 08/09/15 0435 08/10/15 0455 08/10/15 0620  WBC 24.1* 19.3* 13.2* 14.1*  HGB 7.4* 7.9* 6.3* 6.5*  HCT 24.7* 26.4* 21.2* 21.8*  PLT 203 192 154 162    COAGS:  Recent Labs  08/09/15 0435 08/09/15 1432 08/10/15 0455 08/10/15 0705  INR 5.32* 2.76* 2.46* 2.25*    BMP:  Recent Labs  08/07/15 0307 08/08/15 0320 08/09/15 0435 08/10/15 0620  NA 138 140 139 136  K 4.2 3.4* 4.1 2.8*  CL 107 110 110 105  CO2 24 22 22 24   GLUCOSE 104* 111* 114* 172*  BUN 14 17 17 12   CALCIUM 8.4* 8.6* 8.4* 8.1*  CREATININE 1.18* 1.82* 1.85* 1.42*  GFRNONAA 43* 25* 25* 34*  GFRAA 50* 30* 29* 40*    LIVER FUNCTION TESTS:  Recent Labs  08/07/15 0307 08/08/15 0320 08/09/15 0435 08/10/15 0620  BILITOT 0.6 0.5 1.4* 0.8  AST 27 39 181* 51*  ALT 15 27 135* 69*  ALKPHOS 73 70 215* 138*  PROT 7.6 7.7 7.0 6.3*  ALBUMIN 3.1* 2.7* 2.3* 2.1*    TUMOR MARKERS: No results for input(s): AFPTM, CEA, CA199, CHROMGRNA in the last 8760 hours.  Assessment and Plan:  RUQ pain Abnormal GB on [place] Fever; RUQ pain; leukocytosis; N/V Scheduled now for percutaneous cholecystostomy drain placement Risks and Benefits discussed with the patient including, but not limited to bleeding, infection, gallbladder perforation, bile leak, sepsis or even death. All of the patient's questions were answered, patient is agreeable to proceed. Consent signed and in chart. Pt aware risk of bleeding is higher in her case with INR elevated  Thank you for this interesting consult.  I greatly enjoyed  meeting Brianna Hanson and look forward to participating in their care.  A copy of this report was sent to the requesting provider on this date.  Signed: Korrey Schleicher A 08/10/2015, 3:47 PM   I spent a total of 40 Minutes    in face to face in clinical consultation, greater than 50% of which was counseling/coordinating care for percutaneous chole drain

## 2015-08-10 NOTE — Care Management Important Message (Signed)
Important Message  Patient Details  Name: Brianna Hanson MRN: [id number] Date of Birth: 10/28/1934   Medicare Important Message Given:  Yes    [name] P [place]on 08/10/2015, 11:11 AM

## 2015-08-11 DIAGNOSIS — K810 Acute cholecystitis: Secondary | ICD-10-CM | POA: Insufficient documentation

## 2015-08-11 DIAGNOSIS — K819 Cholecystitis, unspecified: Secondary | ICD-10-CM | POA: Insufficient documentation

## 2015-08-11 DIAGNOSIS — D62 Acute posthemorrhagic anemia: Secondary | ICD-10-CM | POA: Insufficient documentation

## 2015-08-11 LAB — TYPE AND SCREEN
ABO/RH(D): O POS
Antibody Screen: NEGATIVE
Unit division: 0
Unit division: 0

## 2015-08-11 LAB — CBC
HCT: 28.1 % — ABNORMAL LOW (ref 36.0–46.0)
Hemoglobin: 8.9 g/dL — ABNORMAL LOW (ref 12.0–15.0)
MCH: 24.7 pg — ABNORMAL LOW (ref 26.0–34.0)
MCHC: 31.7 g/dL (ref 30.0–36.0)
MCV: 77.8 fL — ABNORMAL LOW (ref 78.0–100.0)
Platelets: 167 10*3/uL (ref 150–400)
RBC: 3.61 MIL/uL — ABNORMAL LOW (ref 3.87–5.11)
RDW: 23.5 % — ABNORMAL HIGH (ref 11.5–15.5)
WBC: 13.6 10*3/uL — ABNORMAL HIGH (ref 4.0–10.5)

## 2015-08-11 LAB — COMPREHENSIVE METABOLIC PANEL
ALT: 50 U/L (ref 14–54)
AST: 30 U/L (ref 15–41)
Albumin: 2.2 g/dL — ABNORMAL LOW (ref 3.5–5.0)
Alkaline Phosphatase: 122 U/L (ref 38–126)
Anion gap: 5 (ref 5–15)
BUN: 11 mg/dL (ref 6–20)
CO2: 27 mmol/L (ref 22–32)
Calcium: 8.3 mg/dL — ABNORMAL LOW (ref 8.9–10.3)
Chloride: 108 mmol/L (ref 101–111)
Creatinine, Ser: 1.44 mg/dL — ABNORMAL HIGH (ref 0.44–1.00)
GFR calc Af Amer: 39 mL/min — ABNORMAL LOW (ref >60)
GFR calc non Af Amer: 34 mL/min — ABNORMAL LOW (ref >60)
Glucose, Bld: 93 mg/dL (ref 65–99)
Potassium: 3.3 mmol/L — ABNORMAL LOW (ref 3.5–5.1)
Sodium: 140 mmol/L (ref 135–145)
Total Bilirubin: 1.1 mg/dL (ref 0.3–1.2)
Total Protein: 6.4 g/dL — ABNORMAL LOW (ref 6.5–8.1)

## 2015-08-11 LAB — PREPARE FRESH FROZEN PLASMA
Unit division: 0
Unit division: 0

## 2015-08-11 LAB — CARBOXYHEMOGLOBIN
Carboxyhemoglobin: 1.8 % — ABNORMAL HIGH (ref 0.5–1.5)
Methemoglobin: 1 % (ref 0.0–1.5)
O2 Saturation: 66.8 %
Total hemoglobin: 9 g/dL — ABNORMAL LOW (ref 12.0–16.0)

## 2015-08-11 LAB — PROTIME-INR
INR: 2.45 — ABNORMAL HIGH (ref 0.00–1.49)
Prothrombin Time: 26.3 seconds — ABNORMAL HIGH (ref 11.6–15.2)

## 2015-08-11 LAB — LACTATE DEHYDROGENASE: LDH: 241 U/L — ABNORMAL HIGH (ref 98–192)

## 2015-08-11 MED ORDER — POTASSIUM CHLORIDE 10 MEQ/50ML IV SOLN
10.0000 meq | INTRAVENOUS | Status: AC
  Administered 2015-08-11 (×5): 10 meq via INTRAVENOUS
  Filled 2015-08-11 (×5): qty 50

## 2015-08-11 MED ORDER — WARFARIN SODIUM 2.5 MG PO TABS
2.5000 mg | ORAL_TABLET | Freq: Once | ORAL | Status: AC
  Administered 2015-08-11: 2.5 mg via ORAL
  Filled 2015-08-11: qty 1

## 2015-08-11 MED ORDER — WARFARIN - PHARMACIST DOSING INPATIENT: Freq: Every day | Status: DC

## 2015-08-11 NOTE — Progress Notes (Addendum)
ANTICOAGULATION CONSULT NOTE - Follow Up Consult  Pharmacy Consult for Warfain  Indication: LVAD   No Known Allergies  Patient Measurements: Height: 5' (152.4 cm) Weight: 179 lb 7.3 oz (81.4 kg) IBW/kg (Calculated) : 45.5   Vital Signs: Temp: 98.3 F (36.8 C) (12/09 1116) Temp Source: Oral (12/09 1116) BP: 97/84 mmHg (12/09 0900)  Labs:  Recent Labs  08/10/15 0620 08/10/15 0705 08/10/15 1714 08/11/15 0400  HGB 6.5*  --  8.1* 8.9*  HCT 21.8*  --  26.7* 28.1*  PLT 162  --  148* 167  LABPROT  --  24.6* 23.8* 26.3*  INR  --  2.25* 2.15* 2.45*  CREATININE 1.42*  --  1.43* 1.44*    Estimated Creatinine Clearance: 30.4 mL/min (by C-G formula based on Cr of 1.44).   Medical History: Past Medical History  Diagnosis Date  . Cardiac arrest - ventricular fibrillation 12/10    with successful resucitation, S/p ICD  . ICD (implantable cardiac defibrillator) in place     she has received appropriate therapy for VF  . Atrial fibrillation or flutter   . Osteopenia   . HTN (hypertension)     moderate  . Nonischemic cardiomyopathy ([place])     followed by Dr April Holding at [facility]  . CHF (congestive heart failure) ([place])   . Anemia   . Plasma cell disorder 03/20/2012  . Plasma cell disorder 03/20/2012  . Sessile colonic polyp 2011    Dr Benson Norway  . Diverticula of colon 2011  . 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 [name] (follwoed q 6 months). Underwent implantation of the HeartMate II LVAD on 04/06/13 for DT. She is not on aspirin due to dizziness.   INR 2.59 slightly above goal on admission no bleeding noted despite low H/H. INR 2.45. Hgb 8.9 after 2 units PRBC. Will continue to watch for signs of bleeding.  Home dose Warfarin 2.5mg  daily / 5mg  Wed  Goal of Therapy:  INR 2-2.5 Monitor platelets by anticoagulation protocol: Yes    Plan:  Warfarin 2.5 mg * 1 Daily INR Monitor CBC and signs and symptoms of bleeding    Melburn Popper, PharmD Clinical Pharmacy Resident Pager: [phone] 08/11/2015 11:42 AM

## 2015-08-11 NOTE — Progress Notes (Signed)
Admitted 08/06/15 due to abdominal pain, nausea and vomiting.   HeartMate II LVAD implated on 04/06/13 by Dr. Prescott Gum for DT.   She is feeling much better today--sitting up smiling in bed. Reports that she can breathe much easier although still requiring 3L oxygen with [place]. Pain has subsided and she has been keeping liquids down. Will try some jell-o and chicken broth today.   Vital signs: Temp: 97.8 - 98 HR: 65 - 70s Doppler MAP:  Automated BP: 86 - 98 / 70 - 80s  MAP 80s O2 Sat: 92 - 93% on 3LPM Wt:  169 > 182 > 179 lb    LVAD interrogation reveals:  Speed: 9200 Flow: 5.1 Power: 6 PI: 7 Alarms: none Events: None w/in past 48h Fixed speed: 9200 Low speed limit: 8600  Back up equipment present and set accordingly   Drive Line: C/D/I. Changed 08/10/15 with weekly kit. Change by 08/17/15 or unless soiled.   Labs:  LDH trend: 277 > 288 > 261 > 407 (elevated LFTs) > 217 > 241  INR trend: 2.25 > 2.15 > 2.45   Previously > 5 requiring FFP infusions  WBC trend:   AST/ALT: 27/15 > 39/27 > 181/135 > 30/50  Blood Products: Was having bleeding from hemorrhoids with anemia pre-admission recently and assumed to be continued source requiring transfusion.   08/10/15 --> 2 u PRBC   Plan/Recommendations:  1. Hemoglobin stable after transfusions--RDW elevated 23 - 26, D/W team to consider IV Feraheme while inpatient since she has not been able to tolerate much regarding POs and has been on   2. Physical therapy for rehab to prevent further deconditioning.   3. Will need outpatient drain management. Plan to remove drain at some point vs. Removing GB? Not sure what long term plan will be.

## 2015-08-11 NOTE — Progress Notes (Signed)
Referring Physician(s): Bensimohn  Chief Complaint:  Per chole drain placed 12/8  Subjective:  Pt feels better today already Less pain Less Nausea Drain intact---brownish fluid collected  Allergies: Review of patient's allergies indicates no known allergies.  Medications: Prior to Admission medications   Medication Sig Start Date End Date Taking? Authorizing Provider  amiodarone (PACERONE) 200 MG tablet TAKE 1 TABLET BY MOUTH EVERY DAY 07/26/15  Yes Thompson Grayer, MD  amLODipine (NORVASC) 10 MG tablet TAKE 1 TABLET BY MOUTH TWICE A DAY 03/28/15  Yes Shaune Pascal [name], MD  carvedilol (COREG) 12.5 MG tablet TAKE 1 & 1/2 TABS TWICE DAILY 05/15/15  Yes Jolaine Artist, MD  Cholecalciferol (VITAMIN D3) 1000 UNITS CAPS Take 1 tablet by mouth daily.     Yes Historical Provider, MD  docusate sodium (COLACE) 100 MG capsule Take 1 capsule (100 mg total) by mouth 2 (two) times daily. 07/11/15  Yes Jolaine Artist, MD  ferrous sulfate 325 (65 FE) MG tablet Take 1 tablet (325 mg total) by mouth 2 (two) times daily with a meal. 07/11/15  Yes Jolaine Artist, MD  gabapentin (NEURONTIN) 100 MG capsule TAKE 1 CAPSULE BY MOUTH AT BEDTIME OR AS DIRECTED 02/07/15  Yes Amy D Clegg, NP  hydrALAZINE (APRESOLINE) 100 MG tablet TAKE 1 TABLET BY MOUTH 3 TIMES A DAY 05/04/15  Yes Shaune Pascal [name], MD  losartan (COZAAR) 50 MG tablet TAKE 1 TABLET BY MOUTH EVERY DAY 05/15/15  Yes Amy D Clegg, NP  pantoprazole (PROTONIX) 40 MG tablet TAKE 1 TABLET BY MOUTH EVERY DAY 07/25/15  Yes Jolaine Artist, MD  spironolactone (ALDACTONE) 25 MG tablet TAKE 1 TABLET BY MOUTH EVERY DAY 07/25/15  Yes Jolaine Artist, MD  torsemide (DEMADEX) 20 MG tablet Take 1 tablet (20 mg total) by mouth daily as needed. weight gain 3 lbs in 24 hours 05/11/15  Yes Jolaine Artist, MD  traMADol (ULTRAM) 50 MG tablet Take 1 tablet (50 mg total) by mouth every 6 (six) hours as needed for moderate pain. for pain 05/11/15  Yes Jolaine Artist, MD  warfarin (COUMADIN) 2.5 MG tablet Take 2.5-5 mg by mouth daily. 2.5mg  daily except 5mg  on Wednesdays   Yes Historical Provider, MD     Vital Signs: BP 97/84 mmHg  Pulse 71  Temp(Src) 97.8 F (36.6 C) (Oral)  Resp 16  Ht 5' (1.524 m)  Wt 179 lb 7.3 oz (81.4 kg)  BMI 35.05 kg/m2  SpO2 90%  Physical Exam  Abdominal: Soft. Bowel sounds are normal. There is no tenderness.  Skin: Skin is warm and dry.  Perc chole drain site clean and dry NT No bleeding Output brownish fluid 100 cc in bag 225 cc collected yesterday     Imaging: Ct Abdomen Pelvis Wo Contrast  08/10/2015  CLINICAL DATA:  Worsening right-sided abdominal pain for several days. Percutaneous cholecystostomy. Chronic systolic heart failure with left ventricular assist device. EXAM: CT ABDOMEN AND PELVIS WITHOUT CONTRAST TECHNIQUE: Multidetector CT imaging of the abdomen and pelvis was performed following the standard protocol without IV contrast. COMPARISON:  08/06/2015 FINDINGS: Lower chest: Cardiomegaly again demonstrated with left ventricular assist device in place. Increased bilateral lower lobe consolidation with air bronchograms noted. New tiny bilateral pleural effusions also noted. Hepatobiliary: No mass visualized on this un-enhanced exam. There has been placement of percutaneous cholecystostomy tube since previous study. Increased diffuse gallbladder wall thickening is seen as well as right upper quadrant inflammatory changes, consistent with acute  cholecystitis. Pancreas: No mass or inflammatory process identified on this un-enhanced exam. Spleen: Within normal limits in size. Adrenals/Urinary Tract: No evidence of urolithiasis or hydronephrosis. Small left upper pole renal cyst again noted. Foley catheter seen within the bladder which is collapsed. Stomach/Bowel: No evidence of obstruction. Diffuse colonic diverticulosis is demonstrated, without evidence of diverticulitis. Vascular/Lymphatic: No  pathologically enlarged lymph nodes. No evidence of abdominal aortic aneurysm. Reproductive: Prior hysterectomy noted. Adnexal regions are unremarkable in appearance. Other: Increased diffuse body wall edema and mesenteric edema noted as well as mild ascites which is new since previous study. Musculoskeletal:  No suspicious bone lesions identified. IMPRESSION: Percutaneous cholecystostomy tube in appropriate position. Increased gallbladder wall thickening and pericholecystic inflammatory changes, consistent with acute cholecystitis. New mild ascites and diffuse mesenteric and body wall edema. Increased bilateral lower lobe consolidation and tiny bilateral pleural effusions. Colonic diverticulosis. No radiographic evidence of diverticulitis. Electronically Signed   By: Earle Gell M.D.   On: 08/10/2015 21:49   Nm Hepatobiliary Liver Func  08/09/2015  CLINICAL DATA:  RIGHT upper quadrant pain. RIGHT lower quadrant pain. Nausea and vomiting. Elevated white blood cell count. Abnormal ultrasound with gallbladder wall thickening or pericholecystic fluid. Small gallstones present. Negative sonographic Murphy's sign. EXAM: NUCLEAR MEDICINE HEPATOBILIARY IMAGING TECHNIQUE: Sequential images of the abdomen were obtained out to 60 minutes following intravenous administration of radiopharmaceutical. At 60 minutes, 2.0 mg IV morphine administered. Sixty additional minutes of imaging. RADIOPHARMACEUTICALS:  5.3 mCi Tc-18m  Choletec IV COMPARISON:  Ultrasound 08/08/2015, CT 12 4 16  FINDINGS: The radiotracer clears the blood pool with homogeneous uptake within the liver. Counts are excreted into the small bowel by 25 minutes. The gallbladder is not evident at 60 minutes. IV morphine was administered. There is faint activity along the inferior margin the RIGHT hepatic lobe at the 60 and 65 minutes mark post morphine which is favored to represent subtle aafilling of the gallbladder. IMPRESSION: 1. Delayed filling of the  gallbladder after morphine administration indicates patent cystic duct. 2. Markedly delayed filling of the gallbladder could indicate chronic cholecystitis. 3. Patent common bile duct. These results will be called to the ordering clinician or representative by the Radiologist Assistant, and communication documented in the PACS or zVision Dashboard. Electronically Signed   By: Suzy Bouchard M.D.   On: 08/09/2015 11:22   US Abdomen Port  08/08/2015  CLINICAL DATA:  Generalized abdominal pain. EXAM: ULTRASOUND PORTABLE ABDOMEN COMPARISON:  CT scan of August 06, 2015. FINDINGS: Gallbladder: Mild cholelithiasis is noted with mild gallbladder wall thickening measuring 4 mm. No sonographic Murphy's sign is noted. There is either fluid within the gallbladder wall or a small amount of pericholecystic fluid. Common bile duct: Diameter: 2.8 mm which is within normal limits. Liver: No focal lesion identified. Within normal limits in parenchymal echogenicity. Small amount of fluid is noted around the liver. IVC: No abnormality visualized. Pancreas: Not visualized due to overlying bowel gas. Spleen: Size and appearance within normal limits. Right Kidney: Length: 11.5 cm. Echogenicity within normal limits. No mass or hydronephrosis visualized. Left Kidney: Length: 11.1 cm. 9 mm cyst is seen in midpole. Echogenicity within normal limits. No mass or hydronephrosis visualized. Abdominal aorta: No aneurysm visualized. Other findings: None. IMPRESSION: Pancreas not visualized due to overlying bowel gas. Small amount of free fluid is noted around the liver. Mild cholelithiasis is noted with mild gallbladder wall thickening. Fluid is noted either within the gallbladder wall consistent with edema, or a small amount of pericholecystic fluid is noted.  Overall, these findings are concerning for acute cholecystitis, and clinical correlation and HIDA scan is recommended for further evaluation. Electronically Signed   By: Marijo Conception, M.D.   On: 08/08/2015 15:36   Ir Perc Cholecystostomy  08/10/2015  INDICATION: 80 year old female with a history of LVAD device. She has developed leukocytosis and imaging features of acute cholecystitis on ultrasound. Equivocal nuclear medicine study 08/09/2015. EXAM: ULTRASOUND AND FLUOROSCOPIC-GUIDED CHOLECYSTOSTOMY TUBE PLACEMENT COMPARISON:  Ultrasound 08/10/2015, 08/08/2015, CT 08/06/2015, nuclear medicine study 08/09/2015 MEDICATIONS: Fentanyl 25 mcg IV; Versed 0.5 mg IV; The patient is currently admitted to the hospital and on intravenous antibiotics. Antibiotics were administered within an appropriate time frame prior to skin puncture. ANESTHESIA/SEDATION: Total Moderate Sedation Time Fifteen minutes CONTRAST:  42mL OMNIPAQUE IOHEXOL 300 MG/ML  SOLN FLUOROSCOPY TIME:  36 seconds COMPLICATIONS: None PROCEDURE: Informed written consent was obtained from the patient after a discussion of the risks, benefits and alternatives to treatment. Questions regarding the procedure were encouraged and answered. A timeout was performed prior to the initiation of the procedure. The right upper abdominal quadrant was prepped and draped in the usual sterile fashion, and a sterile drape was applied covering the operative field. Maximum barrier sterile technique with sterile gowns and gloves were used for the procedure. A timeout was performed prior to the initiation of the procedure. Local anesthesia was provided with 1% lidocaine with epinephrine. Ultrasound scanning of the right upper quadrant demonstrates a markedly dilated gallbladder. Of note, the patient reported pain with ultrasound imaging over the gallbladder. Utilizing a transhepatic approach, a 22 gauge needle was advanced into the gallbladder under direct ultrasound guidance. An ultrasound image was saved for documentation purposes. Appropriate intraluminal puncture was confirmed with the efflux of bile and advancement of an 0.018 wire into the gallbladder  lumen. The needle was exchanged for an [place] set. A small amount of contrast was injected to confirm appropriate intraluminal positioning. Over a [place] wire, a 46.2-[place] cholecystomy tube was advanced into the gallbladder fossa, coiled and locked. Bile was aspirated and a small amount of contrast was injected as several post procedural spot radiographic images were obtained in various obliquities. The catheter was secured to the skin with suture, connected to a drainage bag and a dressing was placed. The patient tolerated the procedure well without immediate post procedural complication. IMPRESSION: Status post percutaneous cholecystostomy placement. Sample was sent to the lab for analysis. Signed, Dulcy Fanny. Earleen Newport, DO [facility] Electronically Signed   By: Corrie Mckusick D.O.   On: 08/10/2015 17:43   Dg Chest Port 1 View  08/10/2015  CLINICAL DATA:  Shortness of breath. EXAM: PORTABLE CHEST 1 VIEW COMPARISON:  08/09/2015. FINDINGS: Right PICC line in stable position. Prior CABG. Cardiac pacer and left ventricular assist device in stable position. Persistent low lung volumes with bibasilar atelectasis. Small bilateral pleural effusions cannot be excluded. No pneumothorax. IMPRESSION: 1. Right PICC line in stable position. 2. Prior CABG. Cardiac pacer and left ventricular assist device in stable position. Stable cardiomegaly. No pulmonary venous congestion. 3. Persistent low lung volumes with bibasilar atelectasis. Small bilateral pleural effusions cannot be excluded. No pneumothorax. Electronically Signed   By: Marcello Moores  Register   On: 08/10/2015 07:20   Dg Chest Port 1 View  08/09/2015  CLINICAL DATA:  Central catheter placement EXAM: PORTABLE CHEST 1 VIEW COMPARISON:  August 06, 2015 FINDINGS: Central catheter tip is in the superior vena cava. No pneumothorax. Pacemaker is present with lead  tips attached to the right atrium and right  ventricle. There is a left ventricular assist device present. There is atelectatic change in the left base. The lungs are otherwise clear. The heart is mildly enlarged with pulmonary vascularity within normal limits. No adenopathy. IMPRESSION: Central catheter tip in superior vena cava. No pneumothorax. Left base atelectasis. Lungs elsewhere clear. Heart mildly enlarged with left ventricular assist device present. Pacemaker leads attached to right atrium and right ventricle. Electronically Signed   By: Lowella Grip III M.D.   On: 08/09/2015 17:18   US Abdomen Limited Ruq  08/10/2015  ADDENDUM REPORT: 08/10/2015 09:22 ADDENDUM: Study discussed by telephone with [name] On 08/10/2015 at 0913 hours. Electronically Signed   By: Genevie Ann M.D.   On: 08/10/2015 09:22  08/10/2015  CLINICAL DATA:  80 year old female with right upper quadrant pain and nuclear medicine hepatobiliary study findings suggesting chronic cholecystitis. Cholelithiasis. Initial encounter. EXAM: US ABDOMEN LIMITED - RIGHT UPPER QUADRANT COMPARISON:  Nuclear medicine hepato biliary scan 08/09/2015. CT Abdomen and Pelvis 08/06/2015. FINDINGS: Gallbladder: Gallbladder wall thickening up to 8 mm, with widespread wall heterogeneity. Dependent sludge. Shadowing echogenic mostly gravel type gallstones. These might individually measure up to 11 mm (image 20). Trace pericholecystic fluid. No sonographic Murphy sign elicited. Common bile duct: Diameter: 4 mm, normal Liver: No intrahepatic biliary ductal dilatation. Small central hepatic cyst measuring 9 mm re - demonstrated and appears simple (image 33). No other discrete liver lesion. Other findings: Visible right kidney without hydronephrosis. Small volume perihepatic free fluid, in part contiguous with the gallbladder fossa. IMPRESSION: 1. Markedly abnormal gallbladder wall, edematous and irregular with pericholecystic fluid and small volume perihepatic free fluid. Constellation of imaging  findings since 08/06/2015 are most compatible with Acute on Chronic Cholecystitis. 2. No evidence of biliary obstruction at this time. Electronically Signed: By: Genevie Ann M.D. On: 08/10/2015 07:42    Labs:  CBC:  Recent Labs  08/10/15 0455 08/10/15 0620 08/10/15 1714 08/11/15 0400  WBC 13.2* 14.1* 12.9* 13.6*  HGB 6.3* 6.5* 8.1* 8.9*  HCT 21.2* 21.8* 26.7* 28.1*  PLT 154 162 148* 167    COAGS:  Recent Labs  08/10/15 0455 08/10/15 0705 08/10/15 1714 08/11/15 0400  INR 2.46* 2.25* 2.15* 2.45*    BMP:  Recent Labs  08/09/15 0435 08/10/15 0620 08/10/15 1714 08/11/15 0400  NA 139 136 136 140  K 4.1 2.8* 3.3* 3.3*  CL 110 105 105 108  CO2 22 24 25 27   GLUCOSE 114* 172* 368* 93  BUN 17 12 10 11   CALCIUM 8.4* 8.1* 7.9* 8.3*  CREATININE 1.85* 1.42* 1.43* 1.44*  GFRNONAA 25* 34* 34* 34*  GFRAA 29* 40* 40* 39*    LIVER FUNCTION TESTS:  Recent Labs  08/08/15 0320 08/09/15 0435 08/10/15 0620 08/11/15 0400  BILITOT 0.5 1.4* 0.8 1.1  AST 39 181* 51* 30  ALT 27 135* 69* 50  ALKPHOS 70 215* 138* 122  PROT 7.7 7.0 6.3* 6.4*  ALBUMIN 2.7* 2.3* 2.1* 2.2*    Assessment and Plan:  Percutaneous chole drain intact Output good brownish fluid Will follow few days Plan to keep drain at least 6 weeks -- unless goes to OR  Signed: Nayra Coury A 08/11/2015, 9:30 AM   I spent a total of 15 Minutes at the the patient's bedside AND on the patient's hospital floor or unit, greater than 50% of which was counseling/coordinating care for perc chole drain

## 2015-08-11 NOTE — Progress Notes (Signed)
HeartMate 2 Rounding Note  Subjective:    Had abdominal US concerning for cholecystitis. HIDA scan-patent common bile duct, delayed filling gallbladder ? Chronic cholecytitis.   Yesterday hgb down to 6.3 >8.9. Received 2UPRBCs. Also give FFP . Perc tube place in IR. 250cc out. Off Neo. WBC 14.    Denies SOB. Denies abdominal pain.   LVAD INTERROGATION:  HeartMate II LVAD:  Flow 4.8 liters/min, speed 9200 , power 5.4, PI 7.0 No PI eventss.  No PI events.    Objective:    Vital Signs:   Temp:  [98.6 F (37 C)-99.8 F (37.7 C)] 99 F (37.2 C) (12/09 0000) Pulse Rate:  [65-71] 71 (12/08 2000) Resp:  [16-27] 18 (12/09 0500) BP: (86-110)/(66-98) 89/78 mmHg (12/09 0500) SpO2:  [90 %-97 %] 94 % (12/09 0500) Weight:  [179 lb 7.3 oz (81.4 kg)] 179 lb 7.3 oz (81.4 kg) (12/09 0500) Last BM Date: 08/08/15 Mean arterial Pressure 70-80s on neo  Intake/Output:   Intake/Output Summary (Last 24 hours) at 08/11/15 0711 Last data filed at 08/11/15 0500  Gross per 24 hour  Intake 2135.88 ml  Output   1650 ml  Net 485.88 ml     Physical Exam: CVP 5-6 General:  Lying in bed. NAD. No resp difficulty HEENT: normal Neck: supple. JVP flat . Carotids 2+ bilat; no bruits. No lymphadenopathy or thryomegaly appreciated. Cor: Mechanical heart sounds with LVAD hum present. Irregular Lungs: clear Abdomen: soft, non tender. R quadrant perc tube.  LVAD driveline dressing. No erythema or drainage nondistended. No hepatosplenomegaly. No bruits or masses. Sluggish bowel sounds.  Driveline: C/D/I; securement device intact and driveline incorporated Extremities: no cyanosis, clubbing, rash, edema. RUE PICC. R and LLE SCDs.  Neuro: alert & orientedx3, cranial nerves grossly intact. moves all 4 extremities w/o difficulty. Affect pleasant  Telemetry: NSR 70s  Labs: Basic Metabolic Panel:  Recent Labs Lab 08/08/15 0320 08/09/15 0435 08/10/15 0620 08/10/15 1714 08/11/15 0400  NA 140 139 136 136  140  K 3.4* 4.1 2.8* 3.3* 3.3*  CL 110 110 105 105 108  CO2 22 22 24 25 27   GLUCOSE 111* 114* 172* 368* 93  BUN 17 17 12 10 11   CREATININE 1.82* 1.85* 1.42* 1.43* 1.44*  CALCIUM 8.6* 8.4* 8.1* 7.9* 8.3*    Liver Function Tests:  Recent Labs Lab 08/07/15 0307 08/08/15 0320 08/09/15 0435 08/10/15 0620 08/11/15 0400  AST 27 39 181* 51* 30  ALT 15 27 135* 69* 50  ALKPHOS 73 70 215* 138* 122  BILITOT 0.6 0.5 1.4* 0.8 1.1  PROT 7.6 7.7 7.0 6.3* 6.4*  ALBUMIN 3.1* 2.7* 2.3* 2.1* 2.2*    Recent Labs Lab 08/10/15 0620  AMYLASE 42   No results for input(s): AMMONIA in the last 168 hours.  CBC:  Recent Labs Lab 08/06/15 2044 08/07/15 0307 08/08/15 0320 08/09/15 0435 08/10/15 0455 08/10/15 0620 08/10/15 1714 08/11/15 0400  WBC 7.9 8.4 24.1* 19.3* 13.2* 14.1* 12.9* 13.6*  NEUTROABS 6.3 6.8 22.0* 17.1*  --  12.0*  --   --   HGB 7.6* 7.9* 7.4* 7.9* 6.3* 6.5* 8.1* 8.9*  HCT 25.1* 26.2* 24.7* 26.4* 21.2* 21.8* 26.7* 28.1*  MCV 77.0* 78.4 78.4 77.4* 76.8* 76.8* 78.1 77.8*  PLT 221 223 203 192 154 162 148* 167    INR:  Recent Labs Lab 08/09/15 1432 08/10/15 0455 08/10/15 0705 08/10/15 1714 08/11/15 0400  INR 2.76* 2.46* 2.25* 2.15* 2.45*    Other results:    Imaging:  Ct Abdomen Pelvis Wo Contrast  08/10/2015  CLINICAL DATA:  Worsening right-sided abdominal pain for several days. Percutaneous cholecystostomy. Chronic systolic heart failure with left ventricular assist device. EXAM: CT ABDOMEN AND PELVIS WITHOUT CONTRAST TECHNIQUE: Multidetector CT imaging of the abdomen and pelvis was performed following the standard protocol without IV contrast. COMPARISON:  08/06/2015 FINDINGS: Lower chest: Cardiomegaly again demonstrated with left ventricular assist device in place. Increased bilateral lower lobe consolidation with air bronchograms noted. New tiny bilateral pleural effusions also noted. Hepatobiliary: No mass visualized on this un-enhanced exam. There has been  placement of percutaneous cholecystostomy tube since previous study. Increased diffuse gallbladder wall thickening is seen as well as right upper quadrant inflammatory changes, consistent with acute cholecystitis. Pancreas: No mass or inflammatory process identified on this un-enhanced exam. Spleen: Within normal limits in size. Adrenals/Urinary Tract: No evidence of urolithiasis or hydronephrosis. Small left upper pole renal cyst again noted. Foley catheter seen within the bladder which is collapsed. Stomach/Bowel: No evidence of obstruction. Diffuse colonic diverticulosis is demonstrated, without evidence of diverticulitis. Vascular/Lymphatic: No pathologically enlarged lymph nodes. No evidence of abdominal aortic aneurysm. Reproductive: Prior hysterectomy noted. Adnexal regions are unremarkable in appearance. Other: Increased diffuse body wall edema and mesenteric edema noted as well as mild ascites which is new since previous study. Musculoskeletal:  No suspicious bone lesions identified. IMPRESSION: Percutaneous cholecystostomy tube in appropriate position. Increased gallbladder wall thickening and pericholecystic inflammatory changes, consistent with acute cholecystitis. New mild ascites and diffuse mesenteric and body wall edema. Increased bilateral lower lobe consolidation and tiny bilateral pleural effusions. Colonic diverticulosis. No radiographic evidence of diverticulitis. Electronically Signed   By: Earle Gell M.D.   On: 08/10/2015 21:49   Nm Hepatobiliary Liver Func  08/09/2015  CLINICAL DATA:  RIGHT upper quadrant pain. RIGHT lower quadrant pain. Nausea and vomiting. Elevated white blood cell count. Abnormal ultrasound with gallbladder wall thickening or pericholecystic fluid. Small gallstones present. Negative sonographic Murphy's sign. EXAM: NUCLEAR MEDICINE HEPATOBILIARY IMAGING TECHNIQUE: Sequential images of the abdomen were obtained out to 60 minutes following intravenous administration of  radiopharmaceutical. At 60 minutes, 2.0 mg IV morphine administered. Sixty additional minutes of imaging. RADIOPHARMACEUTICALS:  5.3 mCi Tc-4m  Choletec IV COMPARISON:  Ultrasound 08/08/2015, CT 12 4 16  FINDINGS: The radiotracer clears the blood pool with homogeneous uptake within the liver. Counts are excreted into the small bowel by 25 minutes. The gallbladder is not evident at 60 minutes. IV morphine was administered. There is faint activity along the inferior margin the RIGHT hepatic lobe at the 60 and 65 minutes mark post morphine which is favored to represent subtle aafilling of the gallbladder. IMPRESSION: 1. Delayed filling of the gallbladder after morphine administration indicates patent cystic duct. 2. Markedly delayed filling of the gallbladder could indicate chronic cholecystitis. 3. Patent common bile duct. These results will be called to the ordering clinician or representative by the Radiologist Assistant, and communication documented in the PACS or zVision Dashboard. Electronically Signed   By: Suzy Bouchard M.D.   On: 08/09/2015 11:22   Ir Perc Cholecystostomy  08/10/2015  INDICATION: 80 year old female with a history of LVAD device. She has developed leukocytosis and imaging features of acute cholecystitis on ultrasound. Equivocal nuclear medicine study 08/09/2015. EXAM: ULTRASOUND AND FLUOROSCOPIC-GUIDED CHOLECYSTOSTOMY TUBE PLACEMENT COMPARISON:  Ultrasound 08/10/2015, 08/08/2015, CT 08/06/2015, nuclear medicine study 08/09/2015 MEDICATIONS: Fentanyl 25 mcg IV; Versed 0.5 mg IV; The patient is currently admitted to the hospital and on intravenous antibiotics. Antibiotics were administered within an appropriate  time frame prior to skin puncture. ANESTHESIA/SEDATION: Total Moderate Sedation Time Fifteen minutes CONTRAST:  49mL OMNIPAQUE IOHEXOL 300 MG/ML  SOLN FLUOROSCOPY TIME:  36 seconds COMPLICATIONS: None PROCEDURE: Informed written consent was obtained from the patient after a discussion  of the risks, benefits and alternatives to treatment. Questions regarding the procedure were encouraged and answered. A timeout was performed prior to the initiation of the procedure. The right upper abdominal quadrant was prepped and draped in the usual sterile fashion, and a sterile drape was applied covering the operative field. Maximum barrier sterile technique with sterile gowns and gloves were used for the procedure. A timeout was performed prior to the initiation of the procedure. Local anesthesia was provided with 1% lidocaine with epinephrine. Ultrasound scanning of the right upper quadrant demonstrates a markedly dilated gallbladder. Of note, the patient reported pain with ultrasound imaging over the gallbladder. Utilizing a transhepatic approach, a 22 gauge needle was advanced into the gallbladder under direct ultrasound guidance. An ultrasound image was saved for documentation purposes. Appropriate intraluminal puncture was confirmed with the efflux of bile and advancement of an 0.018 wire into the gallbladder lumen. The needle was exchanged for an Haviland set. A small amount of contrast was injected to confirm appropriate intraluminal positioning. Over a Benson wire, a 65.2-French Cook cholecystomy tube was advanced into the gallbladder fossa, coiled and locked. Bile was aspirated and a small amount of contrast was injected as several post procedural spot radiographic images were obtained in various obliquities. The catheter was secured to the skin with suture, connected to a drainage bag and a dressing was placed. The patient tolerated the procedure well without immediate post procedural complication. IMPRESSION: Status post percutaneous cholecystostomy placement. Sample was sent to the lab for analysis. Signed, Dulcy Fanny. Earleen Newport, DO [facility] Electronically Signed   By: Corrie Mckusick D.O.   On: 08/10/2015 17:43   Dg Chest Port 1  View  08/10/2015  CLINICAL DATA:  Shortness of breath. EXAM: PORTABLE CHEST 1 VIEW COMPARISON:  08/09/2015. FINDINGS: Right PICC line in stable position. Prior CABG. Cardiac pacer and left ventricular assist device in stable position. Persistent low lung volumes with bibasilar atelectasis. Small bilateral pleural effusions cannot be excluded. No pneumothorax. IMPRESSION: 1. Right PICC line in stable position. 2. Prior CABG. Cardiac pacer and left ventricular assist device in stable position. Stable cardiomegaly. No pulmonary venous congestion. 3. Persistent low lung volumes with bibasilar atelectasis. Small bilateral pleural effusions cannot be excluded. No pneumothorax. Electronically Signed   By: Marcello Moores  Register   On: 08/10/2015 07:20   Dg Chest Port 1 View  08/09/2015  CLINICAL DATA:  Central catheter placement EXAM: PORTABLE CHEST 1 VIEW COMPARISON:  August 06, 2015 FINDINGS: Central catheter tip is in the superior vena cava. No pneumothorax. Pacemaker is present with lead tips attached to the right atrium and right ventricle. There is a left ventricular assist device present. There is atelectatic change in the left base. The lungs are otherwise clear. The heart is mildly enlarged with pulmonary vascularity within normal limits. No adenopathy. IMPRESSION: Central catheter tip in superior vena cava. No pneumothorax. Left base atelectasis. Lungs elsewhere clear. Heart mildly enlarged with left ventricular assist device present. Pacemaker leads attached to right atrium and right ventricle. Electronically Signed   By: Lowella Grip III M.D.   On: 08/09/2015 17:18   US Abdomen Limited Ruq  08/10/2015  ADDENDUM REPORT: 08/10/2015 09:22 ADDENDUM: Study discussed by telephone with [name]  On 08/10/2015 at 0913 hours. Electronically Signed   By: Genevie Ann M.D.   On: 08/10/2015 09:22  08/10/2015  CLINICAL DATA:  80 year old female with right upper quadrant pain and nuclear medicine hepatobiliary study  findings suggesting chronic cholecystitis. Cholelithiasis. Initial encounter. EXAM: US ABDOMEN LIMITED - RIGHT UPPER QUADRANT COMPARISON:  Nuclear medicine hepato biliary scan 08/09/2015. CT Abdomen and Pelvis 08/06/2015. FINDINGS: Gallbladder: Gallbladder wall thickening up to 8 mm, with widespread wall heterogeneity. Dependent sludge. Shadowing echogenic mostly gravel type gallstones. These might individually measure up to 11 mm (image 20). Trace pericholecystic fluid. No sonographic Murphy sign elicited. Common bile duct: Diameter: 4 mm, normal Liver: No intrahepatic biliary ductal dilatation. Small central hepatic cyst measuring 9 mm re - demonstrated and appears simple (image 33). No other discrete liver lesion. Other findings: Visible right kidney without hydronephrosis. Small volume perihepatic free fluid, in part contiguous with the gallbladder fossa. IMPRESSION: 1. Markedly abnormal gallbladder wall, edematous and irregular with pericholecystic fluid and small volume perihepatic free fluid. Constellation of imaging findings since 08/06/2015 are most compatible with Acute on Chronic Cholecystitis. 2. No evidence of biliary obstruction at this time. Electronically Signed: By: Genevie Ann M.D. On: 08/10/2015 07:42     Medications:     Scheduled Medications: . amiodarone  200 mg Oral Daily  . antiseptic oral rinse  7 mL Mouth Rinse BID  . docusate sodium  100 mg Oral BID  . ferrous sulfate  325 mg Oral BID WC  . furosemide  20 mg Intravenous Once  . gabapentin  100 mg Oral QHS  . pantoprazole  40 mg Oral Daily  . piperacillin-tazobactam (ZOSYN)  IV  3.375 g Intravenous Q8H  . sodium chloride  10-40 mL Intracatheter Q12H  . vancomycin  750 mg Intravenous Q12H    Infusions: . sodium chloride Stopped (08/09/15 2300)  . dextrose 5 % and 0.45% NaCl 10 mL/hr at 08/10/15 1900  . phenylephrine (NEO-SYNEPHRINE) Adult infusion Stopped (08/10/15 1300)    PRN Medications: acetaminophen **OR**  acetaminophen, fentaNYL (SUBLIMAZE) injection, HYDROmorphone (DILAUDID) injection, ondansetron, sodium chloride, sodium chloride   Assessment:   1. Ab pain and nausea -acute on chronic cholecystitis   --s/p percutaneous GB drain 12/8  2. Chronic systolic HF s/p HM II VAD 3. Acute on chronic renal failure, stage III-IV 4. HTN 5. PAF in NSR on amiodarone 6. Chronic anemia, iron deficient 7. Hypokalemia 8. Fever/nausea/vomiting 9. Anemia with acute blood loss - unclear source    --s/p 2units RBC on 12/8 10. Hypotesion 11. Hypokalemia  Plan/Discussion:    S/P Perc Cholecystectomy  - 225 out . WBC 13.6 . LFTs coming down. Start clear liquids.   CVP 5-6. Hold diuretics for now.   Received 2UPRBCss Hgb up to 8.9 from 6.3. after 2UPRBCs. Check stool.    Continue vanc-zosyn. Cultures NGTD.    INR 2.45.  She has been off coumadin for since Monday.    Off Neo. Stable MAP.   K 3.3 -- Give 5 runs of K.  Length of Stay: 4  Amy Clegg NP-C  08/11/2015, 7:11 AM  VAD Team --- VAD ISSUES ONLY--- Pager [phone] (7am - 7am)  Advanced Heart Failure Team  Pager [phone] (M-F; 7a - 4p)  Please contact [facility] for night-coverage after hours (4p -7a ) and weekends on [email or web address]  Patient seen and examined with Darrick Grinder, NP. We discussed all aspects of the encounter. I agree with the assessment and plan as stated above.   Much  improved with perc drain, it continues to drain. Will continue abx. Await culture results. Hgb stable. Restart coumadin. HF stable as well. CVP 5-6. Start clears. Can restart home meds tomorrow as needed for HTN and HF. Can go to SDU. PT to see. VAD parameters stable.   Everley Evora,MD 11:30 AM

## 2015-08-11 NOTE — Progress Notes (Signed)
[facility] Progress Note    Subjective: Patient had percutaneous cholecystostomy tube placed yesterday for chronic cholecystitis.  She feels better today either because of the the tube or in spite of it.  She was improving somewhat on antibiotics alone with improving WBC and LFTs.  I am still not convinced that her gallbladder is the problem.  She may have passed a stone..  Objective: Vital signs in last 24 hours: Temp:  [97.8 F (36.6 C)-99.8 F (37.7 C)] 97.8 F (36.6 C) (12/09 0728) Pulse Rate:  [65-71] 71 (12/08 2000) Resp:  [14-27] 15 (12/09 0700) BP: (86-110)/(66-98) 92/69 mmHg (12/09 0700) SpO2:  [90 %-97 %] 95 % (12/09 0700) Weight:  [81.4 kg (179 lb 7.3 oz)] 81.4 kg (179 lb 7.3 oz) (12/09 0500) Last BM Date: 08/08/15  Intake/Output from previous day: 12/08 0701 - 12/09 0700 In: 2155.9 [I.V.:699.9; Blood:706; IV Piggyback:750] Out: 2125 [Urine:1900; Drains:225] Intake/Output this shift:    General: Feeling better.  Appetitie has not fully recovered, but taking some liquids.  Lungs: Clear  Abd: Much less generalized abdominal discomfort.  Minimal to no tenderness.  Clear bile coming from her percutaneous drain in the RUQ.  Extremities: No changes  Neuro: Intact  Lab Results:  @LABLAST2 (wbc:2,hgb:2,hct:2,plt:2) BMET ) Recent Labs  08/10/15 1714 08/11/15 0400  NA 136 140  K 3.3* 3.3*  CL 105 108  CO2 25 27  GLUCOSE 368* 93  BUN 10 11  CREATININE 1.43* 1.44*  CALCIUM 7.9* 8.3*   PT/INR  Recent Labs  08/10/15 1714 08/11/15 0400  LABPROT 23.8* 26.3*  INR 2.15* 2.45*   ABG No results for input(s): PHART, HCO3 in the last 72 hours.  Invalid input(s): PCO2, PO2  Studies/Results: Ct Abdomen Pelvis Wo Contrast  08/10/2015  CLINICAL DATA:  Worsening right-sided abdominal pain for several days. Percutaneous cholecystostomy. Chronic systolic heart failure with left ventricular assist device. EXAM: CT ABDOMEN AND PELVIS WITHOUT CONTRAST TECHNIQUE:  Multidetector CT imaging of the abdomen and pelvis was performed following the standard protocol without IV contrast. COMPARISON:  08/06/2015 FINDINGS: Lower chest: Cardiomegaly again demonstrated with left ventricular assist device in place. Increased bilateral lower lobe consolidation with air bronchograms noted. New tiny bilateral pleural effusions also noted. Hepatobiliary: No mass visualized on this un-enhanced exam. There has been placement of percutaneous cholecystostomy tube since previous study. Increased diffuse gallbladder wall thickening is seen as well as right upper quadrant inflammatory changes, consistent with acute cholecystitis. Pancreas: No mass or inflammatory process identified on this un-enhanced exam. Spleen: Within normal limits in size. Adrenals/Urinary Tract: No evidence of urolithiasis or hydronephrosis. Small left upper pole renal cyst again noted. Foley catheter seen within the bladder which is collapsed. Stomach/Bowel: No evidence of obstruction. Diffuse colonic diverticulosis is demonstrated, without evidence of diverticulitis. Vascular/Lymphatic: No pathologically enlarged lymph nodes. No evidence of abdominal aortic aneurysm. Reproductive: Prior hysterectomy noted. Adnexal regions are unremarkable in appearance. Other: Increased diffuse body wall edema and mesenteric edema noted as well as mild ascites which is new since previous study. Musculoskeletal:  No suspicious bone lesions identified. IMPRESSION: Percutaneous cholecystostomy tube in appropriate position. Increased gallbladder wall thickening and pericholecystic inflammatory changes, consistent with acute cholecystitis. New mild ascites and diffuse mesenteric and body wall edema. Increased bilateral lower lobe consolidation and tiny bilateral pleural effusions. Colonic diverticulosis. No radiographic evidence of diverticulitis. Electronically Signed   By: Earle Gell M.D.   On: 08/10/2015 21:49   Nm Hepatobiliary Liver  Func  08/09/2015  CLINICAL DATA:  RIGHT upper  quadrant pain. RIGHT lower quadrant pain. Nausea and vomiting. Elevated white blood cell count. Abnormal ultrasound with gallbladder wall thickening or pericholecystic fluid. Small gallstones present. Negative sonographic Murphy's sign. EXAM: NUCLEAR MEDICINE HEPATOBILIARY IMAGING TECHNIQUE: Sequential images of the abdomen were obtained out to 60 minutes following intravenous administration of radiopharmaceutical. At 60 minutes, 2.0 mg IV morphine administered. Sixty additional minutes of imaging. RADIOPHARMACEUTICALS:  5.3 mCi Tc-15m  Choletec IV COMPARISON:  Ultrasound 08/08/2015, CT 12 4 16  FINDINGS: The radiotracer clears the blood pool with homogeneous uptake within the liver. Counts are excreted into the small bowel by 25 minutes. The gallbladder is not evident at 60 minutes. IV morphine was administered. There is faint activity along the inferior margin the RIGHT hepatic lobe at the 60 and 65 minutes mark post morphine which is favored to represent subtle aafilling of the gallbladder. IMPRESSION: 1. Delayed filling of the gallbladder after morphine administration indicates patent cystic duct. 2. Markedly delayed filling of the gallbladder could indicate chronic cholecystitis. 3. Patent common bile duct. These results will be called to the ordering clinician or representative by the Radiologist Assistant, and communication documented in the PACS or zVision Dashboard. Electronically Signed   By: Suzy Bouchard M.D.   On: 08/09/2015 11:22   Ir Perc Cholecystostomy  08/10/2015  INDICATION: 80 year old female with a history of LVAD device. She has developed leukocytosis and imaging features of acute cholecystitis on ultrasound. Equivocal nuclear medicine study 08/09/2015. EXAM: ULTRASOUND AND FLUOROSCOPIC-GUIDED CHOLECYSTOSTOMY TUBE PLACEMENT COMPARISON:  Ultrasound 08/10/2015, 08/08/2015, CT 08/06/2015, nuclear medicine study 08/09/2015 MEDICATIONS: Fentanyl 25  mcg IV; Versed 0.5 mg IV; The patient is currently admitted to the hospital and on intravenous antibiotics. Antibiotics were administered within an appropriate time frame prior to skin puncture. ANESTHESIA/SEDATION: Total Moderate Sedation Time Fifteen minutes CONTRAST:  38mL OMNIPAQUE IOHEXOL 300 MG/ML  SOLN FLUOROSCOPY TIME:  36 seconds COMPLICATIONS: None PROCEDURE: Informed written consent was obtained from the patient after a discussion of the risks, benefits and alternatives to treatment. Questions regarding the procedure were encouraged and answered. A timeout was performed prior to the initiation of the procedure. The right upper abdominal quadrant was prepped and draped in the usual sterile fashion, and a sterile drape was applied covering the operative field. Maximum barrier sterile technique with sterile gowns and gloves were used for the procedure. A timeout was performed prior to the initiation of the procedure. Local anesthesia was provided with 1% lidocaine with epinephrine. Ultrasound scanning of the right upper quadrant demonstrates a markedly dilated gallbladder. Of note, the patient reported pain with ultrasound imaging over the gallbladder. Utilizing a transhepatic approach, a 22 gauge needle was advanced into the gallbladder under direct ultrasound guidance. An ultrasound image was saved for documentation purposes. Appropriate intraluminal puncture was confirmed with the efflux of bile and advancement of an 0.018 wire into the gallbladder lumen. The needle was exchanged for an [place] set. A small amount of contrast was injected to confirm appropriate intraluminal positioning. Over a [place] wire, a 34.2-French Cook cholecystomy tube was advanced into the gallbladder fossa, coiled and locked. Bile was aspirated and a small amount of contrast was injected as several post procedural spot radiographic images were obtained in various obliquities. The catheter was secured to the skin with suture,  connected to a drainage bag and a dressing was placed. The patient tolerated the procedure well without immediate post procedural complication. IMPRESSION: Status post percutaneous cholecystostomy placement. Sample was sent to the lab for analysis. Signed, Dulcy Fanny. Earleen Newport,  DO [facility] Radiology Electronically Signed   By: Corrie Mckusick D.O.   On: 08/10/2015 17:43   Dg Chest Port 1 View  08/10/2015  CLINICAL DATA:  Shortness of breath. EXAM: PORTABLE CHEST 1 VIEW COMPARISON:  08/09/2015. FINDINGS: Right PICC line in stable position. Prior CABG. Cardiac pacer and left ventricular assist device in stable position. Persistent low lung volumes with bibasilar atelectasis. Small bilateral pleural effusions cannot be excluded. No pneumothorax. IMPRESSION: 1. Right PICC line in stable position. 2. Prior CABG. Cardiac pacer and left ventricular assist device in stable position. Stable cardiomegaly. No pulmonary venous congestion. 3. Persistent low lung volumes with bibasilar atelectasis. Small bilateral pleural effusions cannot be excluded. No pneumothorax. Electronically Signed   By: Marcello Moores  Register   On: 08/10/2015 07:20   Dg Chest Port 1 View  08/09/2015  CLINICAL DATA:  Central catheter placement EXAM: PORTABLE CHEST 1 VIEW COMPARISON:  August 06, 2015 FINDINGS: Central catheter tip is in the superior vena cava. No pneumothorax. Pacemaker is present with lead tips attached to the right atrium and right ventricle. There is a left ventricular assist device present. There is atelectatic change in the left base. The lungs are otherwise clear. The heart is mildly enlarged with pulmonary vascularity within normal limits. No adenopathy. IMPRESSION: Central catheter tip in superior vena cava. No pneumothorax. Left base atelectasis. Lungs elsewhere clear. Heart mildly enlarged with left ventricular assist device present. Pacemaker leads attached to right atrium and right  ventricle. Electronically Signed   By: Lowella Grip III M.D.   On: 08/09/2015 17:18   US Abdomen Limited Ruq  08/10/2015  ADDENDUM REPORT: 08/10/2015 09:22 ADDENDUM: Study discussed by telephone with [name] On 08/10/2015 at 0913 hours. Electronically Signed   By: Genevie Ann M.D.   On: 08/10/2015 09:22  08/10/2015  CLINICAL DATA:  80 year old female with right upper quadrant pain and nuclear medicine hepatobiliary study findings suggesting chronic cholecystitis. Cholelithiasis. Initial encounter. EXAM: US ABDOMEN LIMITED - RIGHT UPPER QUADRANT COMPARISON:  Nuclear medicine hepato biliary scan 08/09/2015. CT Abdomen and Pelvis 08/06/2015. FINDINGS: Gallbladder: Gallbladder wall thickening up to 8 mm, with widespread wall heterogeneity. Dependent sludge. Shadowing echogenic mostly gravel type gallstones. These might individually measure up to 11 mm (image 20). Trace pericholecystic fluid. No sonographic Murphy sign elicited. Common bile duct: Diameter: 4 mm, normal Liver: No intrahepatic biliary ductal dilatation. Small central hepatic cyst measuring 9 mm re - demonstrated and appears simple (image 33). No other discrete liver lesion. Other findings: Visible right kidney without hydronephrosis. Small volume perihepatic free fluid, in part contiguous with the gallbladder fossa. IMPRESSION: 1. Markedly abnormal gallbladder wall, edematous and irregular with pericholecystic fluid and small volume perihepatic free fluid. Constellation of imaging findings since 08/06/2015 are most compatible with Acute on Chronic Cholecystitis. 2. No evidence of biliary obstruction at this time. Electronically Signed: By: Genevie Ann M.D. On: 08/10/2015 07:42    Anti-infectives: Anti-infectives    Start     Dose/Rate Route Frequency Ordered Stop   08/10/15 2330  vancomycin (VANCOCIN) IVPB 750 mg/150 ml premix     750 mg 150 mL/hr over 60 Minutes Intravenous Every 12 hours 08/10/15 1340     08/08/15 0130   piperacillin-tazobactam (ZOSYN) IVPB 3.375 g     3.375 g 12.5 mL/hr over 240 Minutes Intravenous Every 8 hours 08/07/15 1722     08/07/15 1730  piperacillin-tazobactam (ZOSYN) IVPB 3.375 g     3.375 g 100 mL/hr over  30 Minutes Intravenous  Once 08/07/15 1722 08/07/15 1820   08/07/15 1100  vancomycin (VANCOCIN) IVPB 1000 mg/200 mL premix  Status:  Discontinued     1,000 mg 200 mL/hr over 60 Minutes Intravenous Every 24 hours 08/07/15 1002 08/10/15 1339   08/06/15 2015  cephALEXin (KEFLEX) capsule 500 mg  Status:  Discontinued     500 mg Oral 4 times per day 08/06/15 2004 08/07/15 1319      Assessment/Plan: s/p  Advance diet Clearly feels better after percutaneous drain was placed, and nausea and vomiting improved  She needs to be followed in the IR drain clinic for management of her percutaneous drain, and at the appropriate time a cholangiogram should be done to assess continue patency of her cystic duct.  Then a decision needs to be made concerning further management.  We will sign off for now.  Please feel free to contact us again if the patient should worsen or develop complications..  LOS: 4 days   Kathryne Eriksson. Dahlia Bailiff, MD, FACS [phone] [facility] 08/11/2015

## 2015-08-12 DIAGNOSIS — R1011 Right upper quadrant pain: Secondary | ICD-10-CM

## 2015-08-12 DIAGNOSIS — I5023 Acute on chronic systolic (congestive) heart failure: Secondary | ICD-10-CM

## 2015-08-12 LAB — CARBOXYHEMOGLOBIN
Carboxyhemoglobin: 1.5 % (ref 0.5–1.5)
Carboxyhemoglobin: 1.2 % (ref 0.5–1.5)
Methemoglobin: 0.9 % (ref 0.0–1.5)
Methemoglobin: 0.9 % (ref 0.0–1.5)
O2 Saturation: 52.7 %
O2 Saturation: 87.1 %
Total hemoglobin: 9.9 g/dL — ABNORMAL LOW (ref 12.0–16.0)
Total hemoglobin: 9 g/dL — ABNORMAL LOW (ref 12.0–16.0)

## 2015-08-12 LAB — LACTATE DEHYDROGENASE: LDH: 245 U/L — ABNORMAL HIGH (ref 98–192)

## 2015-08-12 LAB — BASIC METABOLIC PANEL
Anion gap: 6 (ref 5–15)
BUN: 10 mg/dL (ref 6–20)
CO2: 26 mmol/L (ref 22–32)
Calcium: 8.2 mg/dL — ABNORMAL LOW (ref 8.9–10.3)
Chloride: 105 mmol/L (ref 101–111)
Creatinine, Ser: 1.55 mg/dL — ABNORMAL HIGH (ref 0.44–1.00)
GFR calc Af Amer: 36 mL/min — ABNORMAL LOW (ref >60)
GFR calc non Af Amer: 31 mL/min — ABNORMAL LOW (ref >60)
Glucose, Bld: 129 mg/dL — ABNORMAL HIGH (ref 65–99)
Potassium: 3.8 mmol/L (ref 3.5–5.1)
Sodium: 137 mmol/L (ref 135–145)

## 2015-08-12 LAB — CBC
HCT: 29.2 % — ABNORMAL LOW (ref 36.0–46.0)
Hemoglobin: 9 g/dL — ABNORMAL LOW (ref 12.0–15.0)
MCH: 24.1 pg — ABNORMAL LOW (ref 26.0–34.0)
MCHC: 30.8 g/dL (ref 30.0–36.0)
MCV: 78.1 fL (ref 78.0–100.0)
Platelets: 187 10*3/uL (ref 150–400)
RBC: 3.74 MIL/uL — ABNORMAL LOW (ref 3.87–5.11)
RDW: 24 % — ABNORMAL HIGH (ref 11.5–15.5)
WBC: 14.1 10*3/uL — ABNORMAL HIGH (ref 4.0–10.5)

## 2015-08-12 LAB — CULTURE, BLOOD (ROUTINE X 2)
Culture: NO GROWTH
Culture: NO GROWTH

## 2015-08-12 LAB — PROTIME-INR
INR: 3.49 — ABNORMAL HIGH (ref 0.00–1.49)
Prothrombin Time: 34.3 seconds — ABNORMAL HIGH (ref 11.6–15.2)

## 2015-08-12 MED ORDER — POTASSIUM CHLORIDE CRYS ER 20 MEQ PO TBCR
20.0000 meq | EXTENDED_RELEASE_TABLET | Freq: Once | ORAL | Status: AC
  Administered 2015-08-12: 20 meq via ORAL
  Filled 2015-08-12: qty 1

## 2015-08-12 MED ORDER — FUROSEMIDE 10 MG/ML IJ SOLN
40.0000 mg | Freq: Once | INTRAMUSCULAR | Status: AC
  Administered 2015-08-12: 40 mg via INTRAVENOUS
  Filled 2015-08-12: qty 4

## 2015-08-12 MED ORDER — HYDRALAZINE HCL 25 MG PO TABS
25.0000 mg | ORAL_TABLET | Freq: Three times a day (TID) | ORAL | Status: DC
  Administered 2015-08-12 – 2015-08-14 (×7): 25 mg via ORAL
  Filled 2015-08-12 (×7): qty 1

## 2015-08-12 NOTE — Progress Notes (Addendum)
ANTICOAGULATION CONSULT NOTE - Follow Up Consult  Pharmacy Consult for warfarin Indication: LVAD  No Known Allergies  Patient Measurements: Height: 5' (152.4 cm) Weight: 180 lb 1.9 oz (81.7 kg) IBW/kg (Calculated) : 45.5  Vital Signs: Temp: 98.8 F (37.1 C) (12/10 0400) Temp Source: Oral (12/10 0400) BP: 95/74 mmHg (12/10 0700)  Labs:  Recent Labs  08/10/15 1714 08/11/15 0400 08/12/15 0240  HGB 8.1* 8.9* 9.0*  HCT 26.7* 28.1* 29.2*  PLT 148* 167 187  LABPROT 23.8* 26.3* 34.3*  INR 2.15* 2.45* 3.49*  CREATININE 1.43* 1.44* 1.55*    Estimated Creatinine Clearance: 28.3 mL/min (by C-G formula based on Cr of 1.55).   Assessment: 80 yo f with NICM s/p LVAD placemen in 2014.  Pharmacy is consulted to dose warfarin.  INR 2.59 on admission (goal 2-2.5). INR 2.45 yesterday -> 2.5 mg dose given -> INR elevated today at 3.49. CBC ok.  Goal of Therapy:  INR 2-2.5 Monitor platelets by anticoagulation protocol: Yes   Plan:  Hold warfarin tonight F/u INR in AM Daily INR/CBC, monitor for s/sx of bleeding  Shey Bartmess L. Nicole Kindred, PharmD Clinical Pharmacy Resident Pager: [phone] 08/12/2015 10:52 AM

## 2015-08-12 NOTE — Progress Notes (Signed)
Patient ID: ARJANAE DELMEDICO, female   DOB: 10/20/1934, 80 y.o.   MRN: [id number]    HeartMate 2 Rounding Note  Subjective:    Had abdominal US concerning for cholecystitis. HIDA scan-patent common bile duct, delayed filling gallbladder.  Possible chronic cholecytitis.  Cholecystostomy tube placed in IR, still significant drainage.  Cultures negative so far.  Remains on abx, afebrile.  Abdomen not sore today, eating liquid diet without problems.   Received 2 units PRBCs this admission,  Hemoglobin stable today.  Warfarin started back 12/9, INR high today.   Mild dyspnea, asked to put on O2. MAP 80s-90s. CVP 10-12 today.  Co-ox low at 53% but drawn very early in am.   LVAD INTERROGATION:  HeartMate II LVAD:  Flow 4.3 liters/min, speed 9200 , power 5.2, PI 7.3.  No PI events.    Objective:    Vital Signs:   Temp:  [98.3 F (36.8 C)-98.8 F (37.1 C)] 98.8 F (37.1 C) (12/10 0400) Resp:  [13-21] 19 (12/10 0700) BP: (88-110)/(69-96) 95/74 mmHg (12/10 0700) SpO2:  [90 %-97 %] 95 % (12/10 0700) Weight:  [180 lb 1.9 oz (81.7 kg)] 180 lb 1.9 oz (81.7 kg) (12/10 0230) Last BM Date: 08/11/15 Mean arterial Pressure 80s-90s  Intake/Output:   Intake/Output Summary (Last 24 hours) at 08/12/15 0844 Last data filed at 08/12/15 0700  Gross per 24 hour  Intake   1280 ml  Output   1165 ml  Net    115 ml     Physical Exam: CVP 10-12 General:  Lying in bed. NAD. No resp difficulty HEENT: normal Neck: supple. JVP 8-9 cm. Carotids 2+ bilat; no bruits. No lymphadenopathy or thryomegaly appreciated. Cor: Mechanical heart sounds with LVAD hum present.  Lungs: clear Abdomen: soft, non tender. R quadrant perc tube.  LVAD driveline dressing. No erythema or drainage nondistended. No hepatosplenomegaly. No bruits or masses. Sluggish bowel sounds.  Driveline: C/D/I; securement device intact and driveline incorporated Extremities: no cyanosis, clubbing, rash, edema. RUE PICC. R and LLE SCDs.  Neuro:  alert & orientedx3, cranial nerves grossly intact. moves all 4 extremities w/o difficulty. Affect pleasant  Telemetry: NSR 60s  Labs: Basic Metabolic Panel:  Recent Labs Lab 08/09/15 0435 08/10/15 0620 08/10/15 1714 08/11/15 0400 08/12/15 0240  NA 139 136 136 140 137  K 4.1 2.8* 3.3* 3.3* 3.8  CL 110 105 105 108 105  CO2 22 24 25 27 26   GLUCOSE 114* 172* 368* 93 129*  BUN 17 12 10 11 10   CREATININE 1.85* 1.42* 1.43* 1.44* 1.55*  CALCIUM 8.4* 8.1* 7.9* 8.3* 8.2*    Liver Function Tests:  Recent Labs Lab 08/07/15 0307 08/08/15 0320 08/09/15 0435 08/10/15 0620 08/11/15 0400  AST 27 39 181* 51* 30  ALT 15 27 135* 69* 50  ALKPHOS 73 70 215* 138* 122  BILITOT 0.6 0.5 1.4* 0.8 1.1  PROT 7.6 7.7 7.0 6.3* 6.4*  ALBUMIN 3.1* 2.7* 2.3* 2.1* 2.2*    Recent Labs Lab 08/10/15 0620  AMYLASE 42   No results for input(s): AMMONIA in the last 168 hours.  CBC:  Recent Labs Lab 08/06/15 2044 08/07/15 0307 08/08/15 0320 08/09/15 0435 08/10/15 0455 08/10/15 0620 08/10/15 1714 08/11/15 0400 08/12/15 0240  WBC 7.9 8.4 24.1* 19.3* 13.2* 14.1* 12.9* 13.6* 14.1*  NEUTROABS 6.3 6.8 22.0* 17.1*  --  12.0*  --   --   --   HGB 7.6* 7.9* 7.4* 7.9* 6.3* 6.5* 8.1* 8.9* 9.0*  HCT 25.1* 26.2* 24.7*  26.4* 21.2* 21.8* 26.7* 28.1* 29.2*  MCV 77.0* 78.4 78.4 77.4* 76.8* 76.8* 78.1 77.8* 78.1  PLT 221 223 203 192 154 162 148* 167 187    INR:  Recent Labs Lab 08/10/15 0455 08/10/15 0705 08/10/15 1714 08/11/15 0400 08/12/15 0240  INR 2.46* 2.25* 2.15* 2.45* 3.49*    Other results:    Imaging: Ct Abdomen Pelvis Wo Contrast  08/10/2015  CLINICAL DATA:  Worsening right-sided abdominal pain for several days. Percutaneous cholecystostomy. Chronic systolic heart failure with left ventricular assist device. EXAM: CT ABDOMEN AND PELVIS WITHOUT CONTRAST TECHNIQUE: Multidetector CT imaging of the abdomen and pelvis was performed following the standard protocol without IV contrast.  COMPARISON:  08/06/2015 FINDINGS: Lower chest: Cardiomegaly again demonstrated with left ventricular assist device in place. Increased bilateral lower lobe consolidation with air bronchograms noted. New tiny bilateral pleural effusions also noted. Hepatobiliary: No mass visualized on this un-enhanced exam. There has been placement of percutaneous cholecystostomy tube since previous study. Increased diffuse gallbladder wall thickening is seen as well as right upper quadrant inflammatory changes, consistent with acute cholecystitis. Pancreas: No mass or inflammatory process identified on this un-enhanced exam. Spleen: Within normal limits in size. Adrenals/Urinary Tract: No evidence of urolithiasis or hydronephrosis. Small left upper pole renal cyst again noted. Foley catheter seen within the bladder which is collapsed. Stomach/Bowel: No evidence of obstruction. Diffuse colonic diverticulosis is demonstrated, without evidence of diverticulitis. Vascular/Lymphatic: No pathologically enlarged lymph nodes. No evidence of abdominal aortic aneurysm. Reproductive: Prior hysterectomy noted. Adnexal regions are unremarkable in appearance. Other: Increased diffuse body wall edema and mesenteric edema noted as well as mild ascites which is new since previous study. Musculoskeletal:  No suspicious bone lesions identified. IMPRESSION: Percutaneous cholecystostomy tube in appropriate position. Increased gallbladder wall thickening and pericholecystic inflammatory changes, consistent with acute cholecystitis. New mild ascites and diffuse mesenteric and body wall edema. Increased bilateral lower lobe consolidation and tiny bilateral pleural effusions. Colonic diverticulosis. No radiographic evidence of diverticulitis. Electronically Signed   By: Earle Gell M.D.   On: 08/10/2015 21:49   Ir Perc Cholecystostomy  08/10/2015  INDICATION: 80 year old female with a history of LVAD device. She has developed leukocytosis and imaging  features of acute cholecystitis on ultrasound. Equivocal nuclear medicine study 08/09/2015. EXAM: ULTRASOUND AND FLUOROSCOPIC-GUIDED CHOLECYSTOSTOMY TUBE PLACEMENT COMPARISON:  Ultrasound 08/10/2015, 08/08/2015, CT 08/06/2015, nuclear medicine study 08/09/2015 MEDICATIONS: Fentanyl 25 mcg IV; Versed 0.5 mg IV; The patient is currently admitted to the hospital and on intravenous antibiotics. Antibiotics were administered within an appropriate time frame prior to skin puncture. ANESTHESIA/SEDATION: Total Moderate Sedation Time Fifteen minutes CONTRAST:  53mL OMNIPAQUE IOHEXOL 300 MG/ML  SOLN FLUOROSCOPY TIME:  36 seconds COMPLICATIONS: None PROCEDURE: Informed written consent was obtained from the patient after a discussion of the risks, benefits and alternatives to treatment. Questions regarding the procedure were encouraged and answered. A timeout was performed prior to the initiation of the procedure. The right upper abdominal quadrant was prepped and draped in the usual sterile fashion, and a sterile drape was applied covering the operative field. Maximum barrier sterile technique with sterile gowns and gloves were used for the procedure. A timeout was performed prior to the initiation of the procedure. Local anesthesia was provided with 1% lidocaine with epinephrine. Ultrasound scanning of the right upper quadrant demonstrates a markedly dilated gallbladder. Of note, the patient reported pain with ultrasound imaging over the gallbladder. Utilizing a transhepatic approach, a 22 gauge needle was advanced into the gallbladder under direct ultrasound guidance.  An ultrasound image was saved for documentation purposes. Appropriate intraluminal puncture was confirmed with the efflux of bile and advancement of an 0.018 wire into the gallbladder lumen. The needle was exchanged for an [place] set. A small amount of contrast was injected to confirm appropriate intraluminal positioning. Over a Benson wire, a 27.2-French  Cook cholecystomy tube was advanced into the gallbladder fossa, coiled and locked. Bile was aspirated and a small amount of contrast was injected as several post procedural spot radiographic images were obtained in various obliquities. The catheter was secured to the skin with suture, connected to a drainage bag and a dressing was placed. The patient tolerated the procedure well without immediate post procedural complication. IMPRESSION: Status post percutaneous cholecystostomy placement. Sample was sent to the lab for analysis. Signed, Dulcy Fanny. Earleen Newport, DO [facility] Electronically Signed   By: Corrie Mckusick D.O.   On: 08/10/2015 17:43     Medications:     Scheduled Medications: . amiodarone  200 mg Oral Daily  . antiseptic oral rinse  7 mL Mouth Rinse BID  . docusate sodium  100 mg Oral BID  . ferrous sulfate  325 mg Oral BID WC  . furosemide  20 mg Intravenous Once  . furosemide  40 mg Intravenous Once  . gabapentin  100 mg Oral QHS  . hydrALAZINE  25 mg Oral 3 times per day  . pantoprazole  40 mg Oral Daily  . piperacillin-tazobactam (ZOSYN)  IV  3.375 g Intravenous Q8H  . potassium chloride  20 mEq Oral Once  . sodium chloride  10-40 mL Intracatheter Q12H  . vancomycin  750 mg Intravenous Q12H  . Warfarin - Pharmacist Dosing Inpatient   Does not apply q1800    Infusions: . sodium chloride Stopped (08/09/15 2300)  . dextrose 5 % and 0.45% NaCl 10 mL/hr at 08/10/15 1900  . phenylephrine (NEO-SYNEPHRINE) Adult infusion Stopped (08/10/15 1300)    PRN Medications: acetaminophen **OR** acetaminophen, fentaNYL (SUBLIMAZE) injection, HYDROmorphone (DILAUDID) injection, ondansetron, sodium chloride, sodium chloride   Assessment:   1. Abdominal pain and nausea: acute on chronic cholecystitis   --s/p percutaneous GB drain 12/8  2. Chronic systolic HF s/p HM II VAD 3. Acute on chronic renal failure, stage III-IV 4. HTN 5.  PAF in NSR on amiodarone 6. Chronic anemia, iron deficient 7. Hypokalemia 8. Fever/nausea/vomiting 9. Anemia with acute blood loss - unclear source    --s/p 2units RBC on 12/8 10. Hypotension: Resolved.  11. Hypokalemia  Plan/Discussion:    S/p cholecystostomy tube. Suspect acute on chronic cholecystitis, ?passed stone.  Still significant drainage. Pain resolved.  Plan to leave drain in place, followup IR drain clinic.  No surgery at this time.  Continue vanco/Zoxyn, cultures remain negative.    Volume overload with mild dyspnea today.  CVP 10-12.  Will give Lasix 40 mg IV x 1 now and reassess.   Co-ox low but drawn very early this am, will repeat.   MAP 80s-90s, gradually restart home meds.  Add hydralazine 25 mg tid now and titrate up.   Hemoglobin stable, no overt bleeding.  Got 2 units PRBCs this admission.  Continue to follow.   INR high today, got low dose coumadin yesterday.  Hold today. Goal INR 2-2.5.   Out of bed today, will consult rehab/PT.    Tolerating clear liquids, may advance tomorrow.  Doreatha Offer,MD 8:44 AM  08/12/2015

## 2015-08-13 LAB — CBC
HCT: 28 % — ABNORMAL LOW (ref 36.0–46.0)
Hemoglobin: 8.6 g/dL — ABNORMAL LOW (ref 12.0–15.0)
MCH: 24.1 pg — ABNORMAL LOW (ref 26.0–34.0)
MCHC: 30.7 g/dL (ref 30.0–36.0)
MCV: 78.4 fL (ref 78.0–100.0)
Platelets: 204 10*3/uL (ref 150–400)
RBC: 3.57 MIL/uL — ABNORMAL LOW (ref 3.87–5.11)
RDW: 24.2 % — ABNORMAL HIGH (ref 11.5–15.5)
WBC: 15.4 10*3/uL — ABNORMAL HIGH (ref 4.0–10.5)

## 2015-08-13 LAB — LACTATE DEHYDROGENASE: LDH: 251 U/L — ABNORMAL HIGH (ref 98–192)

## 2015-08-13 LAB — COMPREHENSIVE METABOLIC PANEL
ALT: 28 U/L (ref 14–54)
AST: 20 U/L (ref 15–41)
Albumin: 2 g/dL — ABNORMAL LOW (ref 3.5–5.0)
Alkaline Phosphatase: 89 U/L (ref 38–126)
Anion gap: 7 (ref 5–15)
BUN: 9 mg/dL (ref 6–20)
CO2: 29 mmol/L (ref 22–32)
Calcium: 8.4 mg/dL — ABNORMAL LOW (ref 8.9–10.3)
Chloride: 103 mmol/L (ref 101–111)
Creatinine, Ser: 1.6 mg/dL — ABNORMAL HIGH (ref 0.44–1.00)
GFR calc Af Amer: 34 mL/min — ABNORMAL LOW (ref >60)
GFR calc non Af Amer: 30 mL/min — ABNORMAL LOW (ref >60)
Glucose, Bld: 115 mg/dL — ABNORMAL HIGH (ref 65–99)
Potassium: 3.4 mmol/L — ABNORMAL LOW (ref 3.5–5.1)
Sodium: 139 mmol/L (ref 135–145)
Total Bilirubin: 0.8 mg/dL (ref 0.3–1.2)
Total Protein: 6.8 g/dL (ref 6.5–8.1)

## 2015-08-13 LAB — CARBOXYHEMOGLOBIN
Carboxyhemoglobin: 1.4 % (ref 0.5–1.5)
Methemoglobin: 0.9 % (ref 0.0–1.5)
O2 Saturation: 61.3 %
Total hemoglobin: 10.6 g/dL — ABNORMAL LOW (ref 12.0–16.0)

## 2015-08-13 LAB — VANCOMYCIN, RANDOM: Vancomycin Rm: <4 ug/mL

## 2015-08-13 LAB — PROTIME-INR
INR: 4.45 — ABNORMAL HIGH (ref 0.00–1.49)
Prothrombin Time: 41.2 seconds — ABNORMAL HIGH (ref 11.6–15.2)

## 2015-08-13 LAB — VANCOMYCIN, TROUGH: Vancomycin Tr: 31 ug/mL (ref 10.0–20.0)

## 2015-08-13 MED ORDER — VANCOMYCIN HCL IN DEXTROSE 750-5 MG/150ML-% IV SOLN
750.0000 mg | INTRAVENOUS | Status: DC
  Filled 2015-08-13: qty 150

## 2015-08-13 MED ORDER — VANCOMYCIN HCL IN DEXTROSE 750-5 MG/150ML-% IV SOLN
750.0000 mg | INTRAVENOUS | Status: DC
  Administered 2015-08-13: 750 mg via INTRAVENOUS
  Filled 2015-08-13 (×2): qty 150

## 2015-08-13 NOTE — Progress Notes (Signed)
CRITICAL VALUE ALERT  Critical value received:  vanc trough 31  Date of notification:  08/13/15   Time of notification:  0026  Critical value read back:Yes.    Nurse who received alert:  Verlin Grills  MD notified (1st page):  Pharmacy  Time of first page:  0030

## 2015-08-13 NOTE — Progress Notes (Signed)
ANTICOAGULATION CONSULT NOTE - Follow Up Consult  Pharmacy Consult for warfarin Indication: suspected LVAD drive line infection  No Known Allergies  Patient Measurements: Height: 5' (152.4 cm) Weight: 177 lb 4 oz (80.4 kg) IBW/kg (Calculated) : 45.5  Vital Signs: Temp: 98.4 F (36.9 C) (12/11 0800) Temp Source: Oral (12/11 0800) BP: 99/77 mmHg (12/11 0800)  Labs:  Recent Labs  08/11/15 0400 08/12/15 0240 08/13/15 0315  HGB 8.9* 9.0* 8.6*  HCT 28.1* 29.2* 28.0*  PLT 167 187 204  LABPROT 26.3* 34.3* 41.2*  INR 2.45* 3.49* 4.45*  CREATININE 1.44* 1.55* 1.60*    Estimated Creatinine Clearance: 27.2 mL/min (by C-G formula based on Cr of 1.6).  Assessment: 80 yo f with LVAD placed in 2014.  Pt on warfarin PTA. INR is high again this AM at 4.45. Hgb dropping but ok right now, plts ok. No bleeding noted. Hold warfarin again tonight.  Goal of Therapy:  INR 2-2.5 Monitor platelets by anticoagulation protocol: Yes   Plan:  Hold warfarin again tonight F/u INR in AM Monitor s/sx of bleeding  Cassie L. Nicole Kindred, PharmD Clinical Pharmacy Resident Pager: [phone] 08/13/2015 10:16 AM

## 2015-08-13 NOTE — Progress Notes (Signed)
Patient ID: Brianna Hanson, female   DOB: [birth year], 81 y.o.   MRN: [id number]    HeartMate 2 Rounding Note  Subjective:    Had abdominal US concerning for cholecystitis. HIDA scan-patent common bile duct, delayed filling gallbladder.  Possible chronic cholecytitis.  Cholecystostomy tube placed in IR, still significant drainage (220 cc yesterday).  Cultures negative so far.  Remains on abx, afebrile.  Abdomen not sore today, eating liquid diet without problems.   Received 2 units PRBCs this admission,  Hemoglobin mildly lower today.  Warfarin given 12/9, INR too high today at 4.45.  No overt bleeding.  CVP high yesterday, got IV Lasix with good diuresis.  CVP 6 today.  Co-ox 61%.    Did some walking yesterday.  LVAD INTERROGATION:  HeartMate II LVAD:  Flow 4.4 liters/min, speed 9200 , power 5, PI 7.3.  1 PI event.   Objective:    Vital Signs:   Temp:  [97.8 F (36.6 C)-99.1 F (37.3 C)] 98.4 F (36.9 C) (12/11 0800) Resp:  [15-26] 22 (12/11 0800) BP: (88-99)/(67-82) 99/77 mmHg (12/11 0800) SpO2:  [94 %-99 %] 99 % (12/11 0800) Weight:  [177 lb 4 oz (80.4 kg)] 177 lb 4 oz (80.4 kg) (12/11 0600) Last BM Date: 08/12/15 Mean arterial Pressure 80s-90s  Intake/Output:   Intake/Output Summary (Last 24 hours) at 08/13/15 0930 Last data filed at 08/13/15 0900  Gross per 24 hour  Intake   1655 ml  Output   2720 ml  Net  -1065 ml     Physical Exam: CVP 6 General:  Lying in bed. NAD. No resp difficulty HEENT: normal Neck: supple. JVP 7-8 cm. Carotids 2+ bilat; no bruits. No lymphadenopathy or thryomegaly appreciated. Cor: Mechanical heart sounds with LVAD hum present.  Lungs: clear Abdomen: soft, non tender. R quadrant perc tube.  LVAD driveline dressing. No erythema or drainage nondistended. No hepatosplenomegaly. No bruits or masses.  Driveline: C/D/I; securement device intact and driveline incorporated Extremities: no cyanosis, clubbing, rash, edema. RUE PICC. R and LLE SCDs.    Neuro: alert & orientedx3, cranial nerves grossly intact. moves all 4 extremities w/o difficulty. Affect pleasant  Telemetry: NSR 60s  Labs: Basic Metabolic Panel:  Recent Labs Lab 08/10/15 0620 08/10/15 1714 08/11/15 0400 08/12/15 0240 08/13/15 0315  NA 136 136 140 137 139  K 2.8* 3.3* 3.3* 3.8 3.4*  CL 105 105 108 105 103  CO2 24 25 27 26 29   GLUCOSE 172* 368* 93 129* 115*  BUN 12 10 11 10 9   CREATININE 1.42* 1.43* 1.44* 1.55* 1.60*  CALCIUM 8.1* 7.9* 8.3* 8.2* 8.4*    Liver Function Tests:  Recent Labs Lab 08/08/15 0320 08/09/15 0435 08/10/15 0620 08/11/15 0400 08/13/15 0315  AST 39 181* 51* 30 20  ALT 27 135* 69* 50 28  ALKPHOS 70 215* 138* 122 89  BILITOT 0.5 1.4* 0.8 1.1 0.8  PROT 7.7 7.0 6.3* 6.4* 6.8  ALBUMIN 2.7* 2.3* 2.1* 2.2* 2.0*    Recent Labs Lab 08/10/15 0620  AMYLASE 42   No results for input(s): AMMONIA in the last 168 hours.  CBC:  Recent Labs Lab 08/06/15 2044 08/07/15 0307 08/08/15 0320 08/09/15 0435  08/10/15 0620 08/10/15 1714 08/11/15 0400 08/12/15 0240 08/13/15 0315  WBC 7.9 8.4 24.1* 19.3*  < > 14.1* 12.9* 13.6* 14.1* 15.4*  NEUTROABS 6.3 6.8 22.0* 17.1*  --  12.0*  --   --   --   --   HGB 7.6* 7.9* 7.4* 7.9*  < >  6.5* 8.1* 8.9* 9.0* 8.6*  HCT 25.1* 26.2* 24.7* 26.4*  < > 21.8* 26.7* 28.1* 29.2* 28.0*  MCV 77.0* 78.4 78.4 77.4*  < > 76.8* 78.1 77.8* 78.1 78.4  PLT 221 223 203 192  < > 162 148* 167 187 204  < > = values in this interval not displayed.  INR:  Recent Labs Lab 08/10/15 0705 08/10/15 1714 08/11/15 0400 08/12/15 0240 08/13/15 0315  INR 2.25* 2.15* 2.45* 3.49* 4.45*    Other results:    Imaging: No results found.   Medications:     Scheduled Medications: . amiodarone  200 mg Oral Daily  . antiseptic oral rinse  7 mL Mouth Rinse BID  . docusate sodium  100 mg Oral BID  . ferrous sulfate  325 mg Oral BID WC  . furosemide  20 mg Intravenous Once  . gabapentin  100 mg Oral QHS  .  hydrALAZINE  25 mg Oral 3 times per day  . pantoprazole  40 mg Oral Daily  . piperacillin-tazobactam (ZOSYN)  IV  3.375 g Intravenous Q8H  . sodium chloride  10-40 mL Intracatheter Q12H  . vancomycin  750 mg Intravenous Q24H  . Warfarin - Pharmacist Dosing Inpatient   Does not apply q1800    Infusions: . sodium chloride Stopped (08/09/15 2300)  . dextrose 5 % and 0.45% NaCl 10 mL/hr at 08/13/15 0800  . phenylephrine (NEO-SYNEPHRINE) Adult infusion Stopped (08/10/15 1300)    PRN Medications: acetaminophen **OR** acetaminophen, fentaNYL (SUBLIMAZE) injection, HYDROmorphone (DILAUDID) injection, ondansetron, sodium chloride, sodium chloride   Assessment:   1. Abdominal pain and nausea: acute on chronic cholecystitis   --s/p percutaneous GB drain 12/8  2. Chronic systolic HF s/p HM II VAD 3. Acute on chronic renal failure, stage III-IV 4. HTN 5. PAF in NSR on amiodarone 6. Chronic anemia, iron deficient 7. Hypokalemia 8. Fever/nausea/vomiting 9. Anemia with acute blood loss - unclear source    --s/p 2units RBC on 12/8 10. Hypotension: Resolved.  11. Hypokalemia  Plan/Discussion:    S/p cholecystostomy tube. Suspect acute on chronic cholecystitis, ?passed stone.  Still significant drainage. Pain resolved.  Plan to leave drain in place, followup IR drain clinic.  No surgery at this time.  Continue vanco/Zoxyn, cultures remain negative.    Volume status better today, CVP down to 6 after IV Lasix yesterday.  Takes prn torsemide at home, will hold off on diuretic today and continue to reassess.   Good co-ox this morning.   MAP 70s-80s. I restarted lower dose hydralazine yesterday, will keep off other home meds for now.    Hemoglobin slightly lower, no overt bleeding.  Got 2 units PRBCs this admission.  Continue to follow. INR too high, continue to hold coumadin (INR goal 2-2.5).   Ambulate, awaiting rehab/PT.   Tolerating clear liquids, advance to solid diet today.   Alethia Melendrez,MD 9:30 AM  08/13/2015

## 2015-08-13 NOTE — Progress Notes (Signed)
ANTIBIOTIC CONSULT NOTE Pharmacy Consult for Vancomycin  Indication: suspected LVAD drive line infection  No Known Allergies  Patient Measurements: Height: 5' (152.4 cm) Weight: 180 lb 1.9 oz (81.7 kg) IBW/kg (Calculated) : 45.5 Adjusted Body Weight: n/a  Vital Signs: Temp: 98.9 F (37.2 C) (12/11 0000) Temp Source: Oral (12/11 0000) BP: 91/71 mmHg (12/11 0000) Intake/Output from previous day: 12/10 0701 - 12/11 0700 In: 1230 [P.O.:760; I.V.:220; IV Piggyback:250] Out: 2600 [Urine:2500; Drains:100] Intake/Output from this shift: Total I/O In: 100 [I.V.:100] Out: -   Labs:  Recent Labs  08/10/15 1714 08/11/15 0400 08/12/15 0240  WBC 12.9* 13.6* 14.1*  HGB 8.1* 8.9* 9.0*  PLT 148* 167 187  CREATININE 1.43* 1.44* 1.55*   Estimated Creatinine Clearance: 28.3 mL/min (by C-G formula based on Cr of 1.55).  Recent Labs  08/10/15 1100 08/12/15 2330  VANCOTROUGH 11 31*     Microbiology: Recent Results (from the past 720 hour(s))  MRSA PCR Screening     Status: None   Collection Time: 08/06/15  9:29 PM  Result Value Ref Range Status   MRSA by PCR NEGATIVE NEGATIVE Final    Comment:        The GeneXpert MRSA Assay (FDA approved for NASAL specimens only), is one component of a comprehensive MRSA colonization surveillance program. It is not intended to diagnose MRSA infection nor to guide or monitor treatment for MRSA infections.   Culture, blood (routine x 2)     Status: None   Collection Time: 08/07/15  9:35 AM  Result Value Ref Range Status   Specimen Description BLOOD RIGHT ANTECUBITAL  Final   Special Requests BOTTLES DRAWN AEROBIC AND ANAEROBIC 10CCS  Final   Culture NO GROWTH 5 DAYS  Final   Report Status 08/12/2015 FINAL  Final  Culture, blood (routine x 2)     Status: None   Collection Time: 08/07/15  9:45 AM  Result Value Ref Range Status   Specimen Description BLOOD RIGHT HAND  Final   Special Requests BOTTLES DRAWN AEROBIC AND ANAEROBIC 5CCS   Final   Culture NO GROWTH 5 DAYS  Final   Report Status 08/12/2015 FINAL  Final  Culture, routine-abscess     Status: None (Preliminary result)   Collection Time: 08/10/15  5:39 PM  Result Value Ref Range Status   Specimen Description ABSCESS GALL BLADDER  Final   Special Requests NONE  Final   Gram Stain   Final    RARE WBC PRESENT,BOTH PMN AND MONONUCLEAR NO SQUAMOUS EPITHELIAL CELLS SEEN NO ORGANISMS SEEN Performed at [employer] [facility]   Final    Culture reincubated for better growth Performed at [facility]    Report Status PENDING  Incomplete    Assessment: 80 yo female admitted with tender driveline site, possible infection or cholecystitis s/p drain, for empiric antibiotics.  Vancomycin trough 31 tonight.  Goal of Therapy:  Vancomycin trough level 15-20 mcg/ml  Plan:  Change vancomycin 750 mg IV q24h F/U renal function  08/13/2015 12:27 AM

## 2015-08-13 NOTE — Progress Notes (Signed)
ANTIBIOTIC CONSULT NOTE Pharmacy Consult for Vancomycin  Indication: suspected LVAD drive line infection  No Known Allergies  Patient Measurements: Height: 5' (152.4 cm) Weight: 177 lb 4 oz (80.4 kg) IBW/kg (Calculated) : 45.5 Adjusted Body Weight: n/a  Vital Signs: Temp: 98.6 F (37 C) (12/11 1200) Temp Source: Oral (12/11 1200) BP: 98/69 mmHg (12/11 1400) Intake/Output from previous day: 12/10 0701 - 12/11 0700 In: 1640 [P.O.:1000; I.V.:340; IV Piggyback:300] Out: 2720 [Urine:2500; Drains:220] Intake/Output from this shift: Total I/O In: 685 [P.O.:545; I.V.:90; IV Piggyback:50] Out: 551 [Urine:550; Stool:1]  Labs:  Recent Labs  08/11/15 0400 08/12/15 0240 08/13/15 0315  WBC 13.6* 14.1* 15.4*  HGB 8.9* 9.0* 8.6*  PLT 167 187 204  CREATININE 1.44* 1.55* 1.60*   Estimated Creatinine Clearance: 27.2 mL/min (by C-G formula based on Cr of 1.6).  Recent Labs  08/12/15 2330 08/13/15 1439  VANCOTROUGH 31*  --   VANCORANDOM  --  <4     Microbiology: Recent Results (from the past 720 hour(s))  MRSA PCR Screening     Status: None   Collection Time: 08/06/15  9:29 PM  Result Value Ref Range Status   MRSA by PCR NEGATIVE NEGATIVE Final    Comment:        The GeneXpert MRSA Assay (FDA approved for NASAL specimens only), is one component of a comprehensive MRSA colonization surveillance program. It is not intended to diagnose MRSA infection nor to guide or monitor treatment for MRSA infections.   Culture, blood (routine x 2)     Status: None   Collection Time: 08/07/15  9:35 AM  Result Value Ref Range Status   Specimen Description BLOOD RIGHT ANTECUBITAL  Final   Special Requests BOTTLES DRAWN AEROBIC AND ANAEROBIC 10CCS  Final   Culture NO GROWTH 5 DAYS  Final   Report Status 08/12/2015 FINAL  Final  Culture, blood (routine x 2)     Status: None   Collection Time: 08/07/15  9:45 AM  Result Value Ref Range Status   Specimen Description BLOOD RIGHT HAND   Final   Special Requests BOTTLES DRAWN AEROBIC AND ANAEROBIC 5CCS  Final   Culture NO GROWTH 5 DAYS  Final   Report Status 08/12/2015 FINAL  Final  Culture, routine-abscess     Status: None (Preliminary result)   Collection Time: 08/10/15  5:39 PM  Result Value Ref Range Status   Specimen Description ABSCESS GALL BLADDER  Final   Special Requests NONE  Final   Gram Stain   Final    RARE WBC PRESENT,BOTH PMN AND MONONUCLEAR NO SQUAMOUS EPITHELIAL CELLS SEEN NO ORGANISMS SEEN Performed at [facility]    Culture   Final    FEW GRAM NEGATIVE RODS Performed at [facility]    Report Status PENDING  Incomplete    Assessment: 80 yo female admitted with tender driveline site, possible infection or cholecystitis s/p drain, for empiric antibiotics. Vancomycin trough 31 last night - pharmacist reduced to 750mg  q24h to start today. Standing vanc dose was then d/c'd this AM prior to being given in order to check another level with SCr creeping up. VR this afternoon was undetectable. Will place patient back on re-calculated dose after last night's high VT.  12/5 vanc>>  Goal of Therapy:  Vancomycin trough level 15-20 mcg/ml  Plan:  Change vancomycin 750 mg IV q24h Monitor clinical progress, c/s, renal function, abx plan/LOT VT@ new SS  Elicia Lamp, PharmD, [facility] Clinical Pharmacist Pager [phone] 08/13/2015 4:07 PM

## 2015-08-14 LAB — COMPREHENSIVE METABOLIC PANEL
ALT: 25 U/L (ref 14–54)
AST: 22 U/L (ref 15–41)
Albumin: 2 g/dL — ABNORMAL LOW (ref 3.5–5.0)
Alkaline Phosphatase: 94 U/L (ref 38–126)
Anion gap: 7 (ref 5–15)
BUN: 8 mg/dL (ref 6–20)
CO2: 28 mmol/L (ref 22–32)
Calcium: 8.2 mg/dL — ABNORMAL LOW (ref 8.9–10.3)
Chloride: 102 mmol/L (ref 101–111)
Creatinine, Ser: 1.57 mg/dL — ABNORMAL HIGH (ref 0.44–1.00)
GFR calc Af Amer: 35 mL/min — ABNORMAL LOW (ref >60)
GFR calc non Af Amer: 30 mL/min — ABNORMAL LOW (ref >60)
Glucose, Bld: 107 mg/dL — ABNORMAL HIGH (ref 65–99)
Potassium: 3.3 mmol/L — ABNORMAL LOW (ref 3.5–5.1)
Sodium: 137 mmol/L (ref 135–145)
Total Bilirubin: 0.9 mg/dL (ref 0.3–1.2)
Total Protein: 6.5 g/dL (ref 6.5–8.1)

## 2015-08-14 LAB — CARBOXYHEMOGLOBIN
Carboxyhemoglobin: 1.1 % (ref 0.5–1.5)
Methemoglobin: 1.2 % (ref 0.0–1.5)
O2 Saturation: 68.6 %
Total hemoglobin: 8.9 g/dL — ABNORMAL LOW (ref 12.0–16.0)

## 2015-08-14 LAB — CBC
HCT: 28.6 % — ABNORMAL LOW (ref 36.0–46.0)
Hemoglobin: 8.6 g/dL — ABNORMAL LOW (ref 12.0–15.0)
MCH: 23.8 pg — ABNORMAL LOW (ref 26.0–34.0)
MCHC: 30.1 g/dL (ref 30.0–36.0)
MCV: 79 fL (ref 78.0–100.0)
Platelets: 234 10*3/uL (ref 150–400)
RBC: 3.62 MIL/uL — ABNORMAL LOW (ref 3.87–5.11)
RDW: 24.2 % — ABNORMAL HIGH (ref 11.5–15.5)
WBC: 13.9 10*3/uL — ABNORMAL HIGH (ref 4.0–10.5)

## 2015-08-14 LAB — CULTURE, ROUTINE-ABSCESS

## 2015-08-14 LAB — PROTIME-INR
INR: 4.84 — ABNORMAL HIGH (ref 0.00–1.49)
Prothrombin Time: 43.8 seconds — ABNORMAL HIGH (ref 11.6–15.2)

## 2015-08-14 LAB — LACTATE DEHYDROGENASE: LDH: 259 U/L — ABNORMAL HIGH (ref 98–192)

## 2015-08-14 MED ORDER — HYDRALAZINE HCL 25 MG PO TABS
37.5000 mg | ORAL_TABLET | Freq: Three times a day (TID) | ORAL | Status: DC
  Administered 2015-08-14 – 2015-08-17 (×9): 37.5 mg via ORAL
  Filled 2015-08-14 (×9): qty 2

## 2015-08-14 MED ORDER — POTASSIUM CHLORIDE CRYS ER 20 MEQ PO TBCR
40.0000 meq | EXTENDED_RELEASE_TABLET | Freq: Once | ORAL | Status: AC
  Administered 2015-08-14: 40 meq via ORAL
  Filled 2015-08-14: qty 2

## 2015-08-14 MED ORDER — OXYCODONE-ACETAMINOPHEN 5-325 MG PO TABS
1.0000 | ORAL_TABLET | ORAL | Status: DC | PRN
  Administered 2015-08-14: 1 via ORAL
  Administered 2015-08-14: 2 via ORAL
  Administered 2015-08-14: 1 via ORAL
  Administered 2015-08-15 – 2015-08-18 (×12): 2 via ORAL
  Filled 2015-08-14 (×3): qty 2
  Filled 2015-08-14: qty 1
  Filled 2015-08-14 (×10): qty 2
  Filled 2015-08-14: qty 1

## 2015-08-14 MED ORDER — TRAMADOL HCL 50 MG PO TABS: 50.0000 mg | ORAL_TABLET | Freq: Four times a day (QID) | ORAL | Status: DC | PRN

## 2015-08-14 MED ORDER — DEXTROSE 5 % IV SOLN
2.0000 g | INTRAVENOUS | Status: DC
  Administered 2015-08-14 – 2015-08-17 (×4): 2 g via INTRAVENOUS
  Filled 2015-08-14 (×5): qty 2

## 2015-08-14 NOTE — Care Management Important Message (Signed)
Important Message  Patient Details  Name: Brianna Hanson MRN: [id number] Date of Birth: 1934-10-14   Medicare Important Message Given:  Yes    [name] [name] 08/14/2015, 1:56 PM

## 2015-08-14 NOTE — Evaluation (Signed)
Physical Therapy Evaluation Patient Details Name: Brianna Hanson MRN: [id number] DOB: Aug 02, 1935 Today's Date: 08/14/2015   History of Present Illness  Ms Orrick is a 80 year old female with a PMH of HF due to severe NICM, chronic systolic HF,PAF, plasma cell disorder (Likely IgA MGUS) - followed by Dr. [name] (follwoed q 6 months). Underwent implantation of the HeartMate II LVAD on 04/06/13 for DT. She is not on aspirin due to dizziness.Had abdominal US concerning for cholecystitis. HIDA scan-patent common bile duct, delayed filling gallbladder. Possible chronic cholecytitis. Cholecystostomy tube placed in IR, still significant drainage (220 cc yesterday). Cultures negative so far.  Clinical Impression  Pt admitted with above diagnosis. Pt currently with functional limitations due to the deficits listed below (see PT Problem List). Pt was able to ambulate with RW.  Fatigues but overall fairly steady.  Should progress and be able to go home with husband assist when medically stable. Will follow acutely.  Pt will benefit from skilled PT to increase their independence and safety with mobility to allow discharge to the venue listed below.     Follow Up Recommendations Home health PT;Supervision/Assistance - 24 hour    Equipment Recommendations  None recommended by PT    Recommendations for Other Services       Precautions / Restrictions Precautions Precautions: Fall Restrictions Weight Bearing Restrictions: No      Mobility  Bed Mobility               General bed mobility comments: Nt in chair  Transfers Overall transfer level: Needs assistance Equipment used: Rolling walker (2 wheeled) Transfers: Sit to/from Stand Sit to Stand: Min assist;+2 safety/equipment         General transfer comment: Pt needed steadying assist to steady pt intially upon standing.   Ambulation/Gait Ambulation/Gait assistance: Min assist;+2 safety/equipment;Min guard Ambulation Distance (Feet):  125 Feet Assistive device: Rolling walker (2 wheeled) Gait Pattern/deviations: Step-through pattern;Decreased stride length;Trunk flexed;Antalgic   Gait velocity interpretation: Below normal speed for age/gender General Gait Details: Pt was able to ambulate with RW in hallway without physical assist.  Cues for upright stance. Followed with chair.    Stairs            Wheelchair Mobility    Modified Rankin (Stroke Patients Only)       Balance Overall balance assessment: Needs assistance Sitting-balance support: No upper extremity supported;Feet supported Sitting balance-Leahy Scale: Good     Standing balance support: Bilateral upper extremity supported;During functional activity Standing balance-Leahy Scale: Poor Standing balance comment: Pt relying on the RW for support in standing.                              Pertinent Vitals/Pain Pain Assessment: Faces Faces Pain Scale: Hurts little more Pain Location: right chole tube site Pain Descriptors / Indicators: Sore Pain Intervention(s): Limited activity within patient's tolerance;Monitored during session;Repositioned;Premedicated before session  VSS    Home Living Family/patient expects to be discharged to:: Private residence Living Arrangements: Spouse/significant other Available Help at Discharge: Family;Available 24 hours/day Type of Home: House Home Access: Stairs to enter Entrance Stairs-Rails: None Entrance Stairs-Number of Steps: 3 Home Layout: One level Home Equipment: Walker - 4 wheels;Bedside commode;Shower seat      Prior Function Level of Independence: Independent               Hand Dominance   Dominant Hand: Right    Extremity/Trunk Assessment  Upper Extremity Assessment: Defer to OT evaluation           Lower Extremity Assessment: Generalized weakness      Cervical / Trunk Assessment: Kyphotic  Communication   Communication: No difficulties  Cognition  Arousal/Alertness: Awake/alert Behavior During Therapy: Flat affect Overall Cognitive Status: Within Functional Limits for tasks assessed                      General Comments General comments (skin integrity, edema, etc.): Pt and husband able to manipulate LVAD.  Husband took over for pt even though she seemed to be able to do it.  No cues needed.      Exercises General Exercises - Lower Extremity Ankle Circles/Pumps: AROM;Both;10 reps;Seated Long Arc Quad: AROM;Both;10 reps;Seated      Assessment/Plan    PT Assessment Patient needs continued PT services  PT Diagnosis Generalized weakness;Acute pain   PT Problem List Decreased activity tolerance;Decreased balance;Decreased mobility;Decreased knowledge of use of DME;Decreased safety awareness;Decreased knowledge of precautions;Pain  PT Treatment Interventions DME instruction;Gait training;Functional mobility training;Therapeutic activities;Therapeutic exercise;Balance training;Cognitive remediation;Stair training   PT Goals (Current goals can be found in the Care Plan section) Acute Rehab PT Goals Patient Stated Goal: to go home PT Goal Formulation: With patient Time For Goal Achievement: 08/28/15 Potential to Achieve Goals: Good    Frequency Min 3X/week   Barriers to discharge        Co-evaluation               End of Session Equipment Utilized During Treatment: Gait belt Activity Tolerance: Patient limited by fatigue;Patient limited by pain Patient left: in chair;with call bell/phone within reach;with chair alarm set;with family/visitor present Nurse Communication: Mobility status         Time: [id number] PT Time Calculation (min) (ACUTE ONLY): 23 min   Charges:   PT Evaluation $Initial PT Evaluation Tier I: 1 Procedure PT Treatments $Gait Training: 8-22 mins   PT G CodesDenice Paradise 2015-08-25, 3:52 PM [place] Noal Abshier,PT Acute Rehabilitation [phone] (pager)

## 2015-08-14 NOTE — Progress Notes (Signed)
1415 Offered to walk with pt. Just to bed after bath. Tired now. We will follow up tomorrow. Encouraged pt to walk with staff later. Graylon Good RN BSN 08/14/2015 2:17 PM

## 2015-08-14 NOTE — Progress Notes (Addendum)
ANTICOAGULATION CONSULT NOTE - Follow Up Consult  Pharmacy Consult for warfarin Indication: lvad  No Known Allergies  Patient Measurements: Height: 5' (152.4 cm) Weight: 176 lb 9.4 oz (80.1 kg) IBW/kg (Calculated) : 45.5  Vital Signs: Temp: 98.6 F (37 C) (12/12 0400) Temp Source: Oral (12/12 0400) BP: 103/84 mmHg (12/12 0600)  Labs:  Recent Labs  08/12/15 0240 08/13/15 0315 08/14/15 0344  HGB 9.0* 8.6* 8.6*  HCT 29.2* 28.0* 28.6*  PLT 187 204 234  LABPROT 34.3* 41.2* 43.8*  INR 3.49* 4.45* 4.84*  CREATININE 1.55* 1.60* 1.57*    Estimated Creatinine Clearance: 27.6 mL/min (by C-G formula based on Cr of 1.57).  Assessment: 80 yo f with LVAD placed in 2014.  Pt on warfarin PTA. INR is high again this AM at 4.8. Hgb stable, plts ok. No bleeding noted. BM noted overnight, no formed but no blood noted. Hold warfarin again tonight.  Goal of Therapy:  INR 2-2.5 Monitor platelets by anticoagulation protocol: Yes   Plan:  Hold warfarin again tonight F/u INR in AM Monitor s/sx of bleeding  Erin Hearing PharmD., BCPS Clinical Pharmacist Pager [phone] 08/14/2015 9:51 AM

## 2015-08-14 NOTE — Progress Notes (Signed)
Patient ID: Brianna Hanson, female   DOB: [birth year], 80 y.o.   MRN: [id number]    HeartMate 2 Rounding Note  Subjective:    Had abdominal US concerning for cholecystitis. HIDA scan-patent common bile duct, delayed filling gallbladder.  Possible chronic cholecytitis.  Cholecystostomy tube placed in IR, still significant drainage (220 cc yesterday).  Cultures negative so far.  Remains on abx, afebrile.  Abdomen not sore today, eating liquid diet without problems.   Received 2 units PRBCs this admission,  Hemoglobin mildly lower today.  Last dose of was 12/9, INR too high today at 4.84.  No overt bleeding.    Co-ox 69%.  Denies SOB. Abdominal much improved. Was able to get OOB.    LVAD INTERROGATION:  HeartMate II LVAD:  Flow 5.2 liters/min, speed 9200 , power 5.4, PI 5.4.  1 PI event.   Objective:    Vital Signs:   Temp:  [98.4 F (36.9 C)-98.8 F (37.1 C)] 98.6 F (37 C) (12/12 0400) Resp:  [15-23] 19 (12/12 0600) BP: (91-103)/(69-84) 103/84 mmHg (12/12 0600) SpO2:  [93 %-99 %] 97 % (12/12 0600) Weight:  [176 lb 9.4 oz (80.1 kg)] 176 lb 9.4 oz (80.1 kg) (12/12 0330) Last BM Date: 08/12/15 Mean arterial Pressure 80s-90s  Intake/Output:   Intake/Output Summary (Last 24 hours) at 08/14/15 0747 Last data filed at 08/14/15 0500  Gross per 24 hour  Intake   1075 ml  Output   1901 ml  Net   -826 ml     Physical Exam: CVP ~3 General:  Lying in bed. NAD. No resp difficulty HEENT: normal Neck: supple. JVP flat. Carotids 2+ bilat; no bruits. No lymphadenopathy or thryomegaly appreciated. Cor: Mechanical heart sounds with LVAD hum present.  Lungs: clear Abdomen: soft, non tender. R quadrant perc tube.  LVAD driveline dressing. No erythema or drainage nondistended. No hepatosplenomegaly. No bruits or masses.  Driveline: C/D/I; securement device intact and driveline incorporated Extremities: no cyanosis, clubbing, rash, edema. RUE PICC. R and LLE SCDs.  Neuro: alert & orientedx3,  cranial nerves grossly intact. moves all 4 extremities w/o difficulty. Affect pleasant  Telemetry: NSR 60s  Labs: Basic Metabolic Panel:  Recent Labs Lab 08/10/15 1714 08/11/15 0400 08/12/15 0240 08/13/15 0315 08/14/15 0344  NA 136 140 137 139 137  K 3.3* 3.3* 3.8 3.4* 3.3*  CL 105 108 105 103 102  CO2 25 27 26 29 28   GLUCOSE 368* 93 129* 115* 107*  BUN 10 11 10 9 8   CREATININE 1.43* 1.44* 1.55* 1.60* 1.57*  CALCIUM 7.9* 8.3* 8.2* 8.4* 8.2*    Liver Function Tests:  Recent Labs Lab 08/09/15 0435 08/10/15 0620 08/11/15 0400 08/13/15 0315 08/14/15 0344  AST 181* 51* 30 20 22   ALT 135* 69* 50 28 25  ALKPHOS 215* 138* 122 89 94  BILITOT 1.4* 0.8 1.1 0.8 0.9  PROT 7.0 6.3* 6.4* 6.8 6.5  ALBUMIN 2.3* 2.1* 2.2* 2.0* 2.0*    Recent Labs Lab 08/10/15 0620  AMYLASE 42   No results for input(s): AMMONIA in the last 168 hours.  CBC:  Recent Labs Lab 08/08/15 0320 08/09/15 0435  08/10/15 0620 08/10/15 1714 08/11/15 0400 08/12/15 0240 08/13/15 0315 08/14/15 0344  WBC 24.1* 19.3*  < > 14.1* 12.9* 13.6* 14.1* 15.4* 13.9*  NEUTROABS 22.0* 17.1*  --  12.0*  --   --   --   --   --   HGB 7.4* 7.9*  < > 6.5* 8.1* 8.9* 9.0* 8.6* 8.6*  HCT 24.7* 26.4*  < > 21.8* 26.7* 28.1* 29.2* 28.0* 28.6*  MCV 78.4 77.4*  < > 76.8* 78.1 77.8* 78.1 78.4 79.0  PLT 203 192  < > 162 148* 167 187 204 234  < > = values in this interval not displayed.  INR:  Recent Labs Lab 08/10/15 1714 08/11/15 0400 08/12/15 0240 08/13/15 0315 08/14/15 0344  INR 2.15* 2.45* 3.49* 4.45* 4.84*    Other results:    Imaging: No results found.   Medications:     Scheduled Medications: . amiodarone  200 mg Oral Daily  . antiseptic oral rinse  7 mL Mouth Rinse BID  . docusate sodium  100 mg Oral BID  . ferrous sulfate  325 mg Oral BID WC  . furosemide  20 mg Intravenous Once  . gabapentin  100 mg Oral QHS  . hydrALAZINE  25 mg Oral 3 times per day  . pantoprazole  40 mg Oral Daily    . piperacillin-tazobactam (ZOSYN)  IV  3.375 g Intravenous Q8H  . potassium chloride  40 mEq Oral Once  . sodium chloride  10-40 mL Intracatheter Q12H  . vancomycin  750 mg Intravenous Q24H  . Warfarin - Pharmacist Dosing Inpatient   Does not apply q1800    Infusions: . sodium chloride Stopped (08/09/15 2300)  . dextrose 5 % and 0.45% NaCl 10 mL/hr at 08/13/15 0800  . phenylephrine (NEO-SYNEPHRINE) Adult infusion Stopped (08/10/15 1300)    PRN Medications: acetaminophen **OR** acetaminophen, fentaNYL (SUBLIMAZE) injection, HYDROmorphone (DILAUDID) injection, ondansetron, sodium chloride, sodium chloride   Assessment:   1. Abdominal pain and nausea: acute on chronic cholecystitis   --s/p percutaneous GB drain 12/8  2. Chronic systolic HF s/p HM II VAD 3. Acute on chronic renal failure, stage III-IV 4. HTN 5. PAF in NSR on amiodarone 6. Chronic anemia, iron deficient 7. Hypokalemia 8. Fever/nausea/vomiting 9. Anemia with acute blood loss - unclear source    --s/p 2units RBC on 12/8 10. Hypotension: Resolved.  11. Hypokalemia  Plan/Discussion:    S/p cholecystostomy tube. Suspect acute on chronic cholecystitis, ?passed stone.  Still significant drainage. 100 ccc output. Pain resolved.  Plan to leave drain in place, followup IR drain clinic.  No surgery at this time.  Continue vanco/Zoxyn, cultures remain negative.  Change to po medications.   Volume status low. CVP ~3-4. Hold diuretics.  Takes prn torsemide at home, will hold off on diuretic today and continue to reassess.   MAP 80-90s. Increase hydralazine to 37.5 mg tid.    Hemoglobin 8.6, no overt bleeding.  Got 2 units PRBCs this admission.  Continue to follow. INR too high, continue to hold coumadin (INR goal 2-2.5).   PT consult. May need CIR?   On regular diet.   Darrick Grinder, NP-C  7:47 AM  08/14/2015  Patient seen and examined with Darrick Grinder, NP. We discussed all aspects of the encounter. I agree with the  assessment and plan as stated above.   She is improved but still with copious drainage from cholecystotomy tube. Culture with few GNR. Will stop vanc/zosyn. Consider switch to ceftriaxone. Will d/w pharmacy. Remains very weak. Needs to ambulate/work with PT.   HF stable. Holding diuretics due to low CVP. MAPs ok. INR high. VAD parameters stable. (CVP and VAD parameters checked personally).   [name], Daniel,MD 9:55 AM

## 2015-08-14 NOTE — Progress Notes (Signed)
OT Cancellation Note  Patient Details Name: Brianna Hanson MRN: [id number] DOB: 07-29-1935   Cancelled Treatment:    Reason Eval/Treat Not Completed: Fatigue/lethargy limiting ability to participate - will reattempt.   Naylene Foell M  .Lucille Passy, OTR/L [id number]   08/14/2015, 3:50 PM

## 2015-08-15 LAB — BASIC METABOLIC PANEL
Anion gap: 7 (ref 5–15)
BUN: 6 mg/dL (ref 6–20)
CO2: 27 mmol/L (ref 22–32)
Calcium: 8.2 mg/dL — ABNORMAL LOW (ref 8.9–10.3)
Chloride: 106 mmol/L (ref 101–111)
Creatinine, Ser: 1.41 mg/dL — ABNORMAL HIGH (ref 0.44–1.00)
GFR calc Af Amer: 40 mL/min — ABNORMAL LOW (ref >60)
GFR calc non Af Amer: 35 mL/min — ABNORMAL LOW (ref >60)
Glucose, Bld: 78 mg/dL (ref 65–99)
Potassium: 3.5 mmol/L (ref 3.5–5.1)
Sodium: 140 mmol/L (ref 135–145)

## 2015-08-15 LAB — CBC
HCT: 28.5 % — ABNORMAL LOW (ref 36.0–46.0)
Hemoglobin: 8.6 g/dL — ABNORMAL LOW (ref 12.0–15.0)
MCH: 24.1 pg — ABNORMAL LOW (ref 26.0–34.0)
MCHC: 30.2 g/dL (ref 30.0–36.0)
MCV: 79.8 fL (ref 78.0–100.0)
Platelets: 243 10*3/uL (ref 150–400)
RBC: 3.57 MIL/uL — ABNORMAL LOW (ref 3.87–5.11)
RDW: 24.7 % — ABNORMAL HIGH (ref 11.5–15.5)
WBC: 15.1 10*3/uL — ABNORMAL HIGH (ref 4.0–10.5)

## 2015-08-15 LAB — CARBOXYHEMOGLOBIN
Carboxyhemoglobin: 1.5 % (ref 0.5–1.5)
Methemoglobin: 0.8 % (ref 0.0–1.5)
O2 Saturation: 63.6 %
Total hemoglobin: 10.9 g/dL — ABNORMAL LOW (ref 12.0–16.0)

## 2015-08-15 LAB — PROTIME-INR
INR: 4.66 — ABNORMAL HIGH (ref 0.00–1.49)
Prothrombin Time: 42.6 seconds — ABNORMAL HIGH (ref 11.6–15.2)

## 2015-08-15 LAB — LACTATE DEHYDROGENASE: LDH: 285 U/L — ABNORMAL HIGH (ref 98–192)

## 2015-08-15 MED ORDER — SPIRONOLACTONE 25 MG PO TABS
12.5000 mg | ORAL_TABLET | Freq: Every day | ORAL | Status: DC
  Administered 2015-08-15: 12.5 mg via ORAL
  Filled 2015-08-15: qty 1

## 2015-08-15 MED ORDER — POTASSIUM CHLORIDE CRYS ER 20 MEQ PO TBCR
40.0000 meq | EXTENDED_RELEASE_TABLET | Freq: Once | ORAL | Status: AC
  Administered 2015-08-15: 40 meq via ORAL
  Filled 2015-08-15: qty 2

## 2015-08-15 NOTE — Progress Notes (Signed)
CARDIAC REHAB PHASE I   PRE:  Rate/Rhythm: 79 SR    BP: sitting 105/82    SaO2: 100 2L, 92 RA  MODE:  Ambulation: 100 ft   POST:  Rate/Rhythm: 96 SR    BP: sitting 96/76     SaO2: 84 RA, 91 2L then up to 100 2L  Pt very fatigued donning batteries and vest, needed multiple rest stops while sitting on EOB. Also c/o 5/10 right side pain. Fairly steady walking, only c/o was fatigue (assist x2 with RW). However after walking SAO2 was 84 RA. Applied 2L, SaO2 slowly up to 91 2L. To BSC then bed. We assisted pt taking batteries off due to fatigue. Pt sts at home she dons her vest/batteries then lies down for 30 min to rest. Pt exhausted after walking and all the activities. Left in bed on 2L O2. [id number]  Brianna Hanson [place] CES, ACSM 08/15/2015 11:18 AM

## 2015-08-15 NOTE — Evaluation (Signed)
Occupational Therapy Evaluation Patient Details Name: Brianna Hanson MRN: [id number] DOB: 1934-10-01 Today's Date: 08/15/2015    History of Present Illness Ms Tardiff is a 80 year old female with a PMH of HF due to severe NICM, chronic systolic HF,PAF, plasma cell disorder (Likely IgA MGUS) - followed by Dr. [name] (follwoed q 6 months). Underwent implantation of the HeartMate II LVAD on 04/06/13 for DT. She is not on aspirin due to dizziness.Had abdominal US concerning for cholecystitis. HIDA scan-patent common bile duct, delayed filling gallbladder. Possible chronic cholecytitis. Cholecystostomy tube placed in IR, still significant drainage (220 cc yesterday). Cultures negative so far.   Clinical Impression   Pt admitted with above. She demonstrates the below listed deficits and will benefit from continued OT to maximize safety and independence with BADLs.  Pt presents to OT with generalized weakness and acute pain.  She requires min - mod A for ADLs due to fatigue and pain, but she is motivated to improve.  Will follow acutely.       Follow Up Recommendations  No OT follow up;Supervision/Assistance - 24 hour    Equipment Recommendations  None recommended by OT    Recommendations for Other Services       Precautions / Restrictions Precautions Precautions: Fall      Mobility Bed Mobility Overal bed mobility: Needs Assistance Bed Mobility: Supine to Sit;Sit to Supine     Supine to sit: Min guard Sit to supine: Min guard      Transfers Overall transfer level: Needs assistance Equipment used: Rolling walker (2 wheeled) Transfers: Sit to/from [employer] Sit to Stand: Min guard Stand pivot transfers: Min guard            Balance Overall balance assessment: Needs assistance Sitting-balance support: Feet supported Sitting balance-Leahy Scale: Good     Standing balance support: During functional activity Standing balance-Leahy Scale: Fair                              ADL Overall ADL's : Needs assistance/impaired Eating/Feeding: Independent   Grooming: Wash/dry hands;Wash/dry face;Oral care;Min guard;Standing   Upper Body Bathing: Set up;Supervision/ safety;Sitting   Lower Body Bathing: Moderate assistance;Sit to/from stand   Upper Body Dressing : Minimal assistance;Sitting   Lower Body Dressing: Maximal assistance;Sit to/from stand Lower Body Dressing Details (indicate cue type and reason): Pt unable to access feet due pain  Toilet Transfer: Min guard;Ambulation;BSC;RW   Toileting- Water quality scientist and Hygiene: Min guard;Sit to/from stand       Functional mobility during ADLs: Min guard;Rolling walker General ADL Comments: Pt moves very slowly and guarded due to pain      Vision     Perception     Praxis      Pertinent Vitals/Pain Pain Assessment: Faces Faces Pain Scale: Hurts little more Pain Location: Rt side  Pain Descriptors / Indicators: Aching Pain Intervention(s): Monitored during session     Hand Dominance Right   Extremity/Trunk Assessment Upper Extremity Assessment Upper Extremity Assessment: Generalized weakness   Lower Extremity Assessment Lower Extremity Assessment: Defer to PT evaluation   Cervical / Trunk Assessment Cervical / Trunk Assessment: Kyphotic   Communication Communication Communication: No difficulties   Cognition Arousal/Alertness: Awake/alert Behavior During Therapy: Flat affect Overall Cognitive Status: Within Functional Limits for tasks assessed                     General Comments  Exercises       Shoulder Instructions      Home Living Family/patient expects to be discharged to:: Private residence Living Arrangements: Spouse/significant other Available Help at Discharge: Family;Available 24 hours/day Type of Home: House Home Access: Stairs to enter [employer] of Steps: 3 Entrance Stairs-Rails: None Home  Layout: One level     Bathroom Shower/Tub: Occupational psychologist: Standard Bathroom Accessibility: Yes   Home Equipment: Environmental consultant - 4 wheels;Bedside commode;Shower seat;Adaptive equipment Adaptive Equipment: Long-handled sponge        Prior Functioning/Environment Level of Independence: Needs assistance  Gait / Transfers Assistance Needed: independent ADL's / Homemaking Assistance Needed: Spouse assists with LB ADLs as pt reports she gets dizzy in shower when she bends forward    Comments: Pt does not drive, but is active in the community     OT Diagnosis: Generalized weakness;Acute pain   OT Problem List: Decreased strength;Decreased activity tolerance;Pain   OT Treatment/Interventions: Self-care/ADL training;DME and/or AE instruction;Therapeutic activities;Patient/family education    OT Goals(Current goals can be found in the care plan section) Acute Rehab OT Goals Patient Stated Goal: to feel better and go home  OT Goal Formulation: With patient Time For Goal Achievement: 08/29/15 Potential to Achieve Goals: Good ADL Goals Pt Will Perform Grooming: with modified independence;standing Pt Will Perform Upper Body Bathing: with modified independence;sitting Pt Will Perform Upper Body Dressing: with modified independence;sitting Pt Will Perform Lower Body Dressing: with modified independence;sit to/from stand Pt Will Transfer to Toilet: with modified independence;ambulating;regular height toilet;grab bars Pt Will Perform Toileting - Clothing Manipulation and hygiene: with modified independence;sit to/from stand  OT Frequency: Min 2X/week   Barriers to D/C:            Co-evaluation              End of Session Equipment Utilized During Treatment: Surveyor, mining Communication: Mobility status  Activity Tolerance: Patient limited by fatigue;No increased pain Patient left: in bed;with call bell/phone within reach   Time: 1352-1422 OT Time  Calculation (min): 30 min Charges:  OT General Charges $OT Visit: 1 Procedure OT Evaluation $Initial OT Evaluation Tier I: 1 Procedure OT Treatments $Self Care/Home Management : 8-22 mins G-Codes:    Gaddiel Cullens M 08/27/15, 2:34 PM

## 2015-08-15 NOTE — Progress Notes (Addendum)
ANTICOAGULATION CONSULT NOTE - Follow Up Consult  Pharmacy Consult for warfarin Indication:lvad  No Known Allergies  Patient Measurements: Height: 5' (152.4 cm) Weight: 176 lb 9.4 oz (80.1 kg) IBW/kg (Calculated) : 45.5  Vital Signs: Temp: 98.1 F (36.7 C) (12/13 0855) Temp Source: Oral (12/13 0855) BP: 96/70 mmHg (12/13 0855)  Labs:  Recent Labs  08/13/15 0315 08/14/15 0344 08/15/15 0415 08/15/15 0906  HGB 8.6* 8.6*  --  8.6*  HCT 28.0* 28.6*  --  28.5*  PLT 204 234  --  243  LABPROT 41.2* 43.8* 42.6*  --   INR 4.45* 4.84* 4.66*  --   CREATININE 1.60* 1.57* 1.41*  --     Estimated Creatinine Clearance: 30.8 mL/min (by C-G formula based on Cr of 1.41).  Assessment: 80 yo f with LVAD placed in 2014. Pt on warfarin PTA.   INR is still elevated this AM at 4.6. Hgb stable, plts ok. No bleeding noted. BM noted overnight, no formed but no blood noted. Hold warfarin again tonight.  Goal of Therapy:  INR 2-2.5 Monitor platelets by anticoagulation protocol: Yes   Plan:  Hold warfarin again tonight F/u INR in AM Monitor s/sx of bleeding  Erin Hearing PharmD., BCPS Clinical Pharmacist Pager [phone] 08/15/2015 10:40 AM

## 2015-08-15 NOTE — Progress Notes (Signed)
Admitted 08/06/15 due to abdominal pain, nausea and vomiting.   HeartMate II LVAD implated on 04/06/13 by Dr. Prescott Gum for DT.   Feeling good this morning. Brianna Hanson is with her at the bedside. She looks comfortable and seems to be getting stronger. Walking short distances with walker but gets winded and fatigued easily. Pain is relatively controlled however it "flares up every so often.   Vital signs: Temp: 97.7 HR: 70s Doppler BP: not done Automated BP: 86 - 98 / 70 - 80s  MAP 80s O2 Sat: >95% on 2 LPM Wt:  169 > ... > 176 > 176 lb    LVAD interrogation reveals:  Speed: 9200 Flow: 5.0 Power: 5.5w PI: 7.1 Alarms: none Events: rare to none Fixed speed: 9200 Low speed limit: 8600  Back up equipment present and set accordingly   Drive Line: C/D/I. Changed 08/10/15 with weekly kit. Change by 08/17/15 or unless soiled.   Labs:  LDH trend: 277 > ... > 407 (elevated LFTs) > ...> 259 > 285  INR trend: 2.25 > 2.15 > 2.45   Previously > 5 requiring FFP infusions  WBC trend: 14.1 > 15.4 > 13.9  AST/ALT: 27/15 > 39/27 > 181/135 > 30/50 > 22/25  Blood Products: Was having bleeding from hemorrhoids with anemia pre-admission recently and assumed to be continued source requiring transfusion.   08/10/15 --> 2 u PRBC   Plan/Recommendations:  1. Hemoglobin stable. Back on home PO Iron BID  2. Physical therapy for rehab to prevent further deconditioning--> PT recommending home PT and supervision. Being set up and she is open to this plan.   3.Outpatient drain management once able to go home.

## 2015-08-15 NOTE — Care Management Note (Signed)
Case Management Note  Patient Details  Name: Brianna Hanson MRN: [id number] Date of Birth: Apr 10, 1935  Subjective/Objective:       Adm w abd pain, has lvad            Action/Plan: lives w fam, pcp dr Ronnald Ramp   Expected Discharge Date:                  Expected Discharge Plan:  [place]  In-House Referral:     Discharge planning Services  CM Consult  Post Acute Care Choice:  Home Health Choice offered to:  Patient  DME Arranged:    DME Agency:     HH Arranged:  RN, PT [place] Agency:  [facility]  Status of Service:     Medicare Important Message Given:  Yes Date Medicare IM Given:    Medicare IM give by:    Date Additional Medicare IM Given:    Additional Medicare Important Message give by:     If discussed at [place] of Stay Meetings, dates discussed:    Additional Comments: pt has used [facility] in past and would like to use them again for hhc. Ref to donna w [facility] for hhrn and hhpt  Lacretia Leigh, RN 08/15/2015, 10:08 AM

## 2015-08-15 NOTE — Progress Notes (Signed)
Patient ID: Brianna Hanson, female   DOB: 21-Mar-1935, 80 y.o.   MRN: [id number]    HeartMate 2 Rounding Note  Subjective:    Had abdominal US concerning for cholecystitis. HIDA scan-patent common bile duct, delayed filling gallbladder.  Possible chronic cholecytitis.  Cholecystostomy tube placed in IR, still significant drainage (200 cc yesterday).  Cultures --> Klebsiella . Antibiotics narrowed to ceftrixone.   Received 2 units PRBCs this admission,  Hemoglobin mildly lower today.  Last dose of was 12/9, INR too high today at 4.66.  No overt bleeding.    Co-ox 64%.  Denies SOB. Abdominal much improved. Ambulated 125 feet with PT.    LVAD INTERROGATION:  HeartMate II LVAD:  Flow 5.2 liters/min, speed 9200 , power 5.5, PI 7.2.  1 PI event.   Objective:    Vital Signs:   Temp:  [97.7 F (36.5 C)-99.1 F (37.3 C)] 97.7 F (36.5 C) (12/13 0400) Resp:  [14-22] 16 (12/13 0400) BP: (94-107)/(70-82) 95/70 mmHg (12/13 0400) SpO2:  [76 %-98 %] 95 % (12/13 0400) Weight:  [176 lb 9.4 oz (80.1 kg)] 176 lb 9.4 oz (80.1 kg) (12/13 0403) Last BM Date: 08/14/15 Mean arterial Pressure 80s  Intake/Output:   Intake/Output Summary (Last 24 hours) at 08/15/15 0726 Last data filed at 08/15/15 0500  Gross per 24 hour  Intake    680 ml  Output    830 ml  Net   -150 ml     Physical Exam: CVP ~6 General:  Lying in bed. NAD. No resp difficulty HEENT: normal Neck: supple. JVP flat. Carotids 2+ bilat; no bruits. No lymphadenopathy or thryomegaly appreciated. Cor: Mechanical heart sounds with LVAD hum present.  Lungs: clear Abdomen: soft, non tender. R quadrant perc tube.  LVAD driveline dressing. No erythema or drainage nondistended. No hepatosplenomegaly. No bruits or masses.  Driveline: C/D/I; securement device intact and driveline incorporated Extremities: no cyanosis, clubbing, rash, edema. RUE PICC. R and LLE SCDs.  Neuro: alert & orientedx3, cranial nerves grossly intact. moves all 4  extremities w/o difficulty. Affect pleasant  Telemetry: NSR 60s  Labs: Basic Metabolic Panel:  Recent Labs Lab 08/11/15 0400 08/12/15 0240 08/13/15 0315 08/14/15 0344 08/15/15 0415  NA 140 137 139 137 140  K 3.3* 3.8 3.4* 3.3* 3.5  CL 108 105 103 102 106  CO2 27 26 29 28 27   GLUCOSE 93 129* 115* 107* 78  BUN 11 10 9 8 6   CREATININE 1.44* 1.55* 1.60* 1.57* 1.41*  CALCIUM 8.3* 8.2* 8.4* 8.2* 8.2*    Liver Function Tests:  Recent Labs Lab 08/09/15 0435 08/10/15 0620 08/11/15 0400 08/13/15 0315 08/14/15 0344  AST 181* 51* 30 20 22   ALT 135* 69* 50 28 25  ALKPHOS 215* 138* 122 89 94  BILITOT 1.4* 0.8 1.1 0.8 0.9  PROT 7.0 6.3* 6.4* 6.8 6.5  ALBUMIN 2.3* 2.1* 2.2* 2.0* 2.0*    Recent Labs Lab 08/10/15 0620  AMYLASE 42   No results for input(s): AMMONIA in the last 168 hours.  CBC:  Recent Labs Lab 08/09/15 0435  08/10/15 0620 08/10/15 1714 08/11/15 0400 08/12/15 0240 08/13/15 0315 08/14/15 0344  WBC 19.3*  < > 14.1* 12.9* 13.6* 14.1* 15.4* 13.9*  NEUTROABS 17.1*  --  12.0*  --   --   --   --   --   HGB 7.9*  < > 6.5* 8.1* 8.9* 9.0* 8.6* 8.6*  HCT 26.4*  < > 21.8* 26.7* 28.1* 29.2* 28.0* 28.6*  MCV  77.4*  < > 76.8* 78.1 77.8* 78.1 78.4 79.0  PLT 192  < > 162 148* 167 187 204 234  < > = values in this interval not displayed.  INR:  Recent Labs Lab 08/11/15 0400 08/12/15 0240 08/13/15 0315 08/14/15 0344 08/15/15 0415  INR 2.45* 3.49* 4.45* 4.84* 4.66*    Other results:    Imaging: No results found.   Medications:     Scheduled Medications: . amiodarone  200 mg Oral Daily  . antiseptic oral rinse  7 mL Mouth Rinse BID  . cefTRIAXone (ROCEPHIN)  IV  2 g Intravenous Q24H  . docusate sodium  100 mg Oral BID  . ferrous sulfate  325 mg Oral BID WC  . gabapentin  100 mg Oral QHS  . hydrALAZINE  37.5 mg Oral 3 times per day  . pantoprazole  40 mg Oral Daily  . sodium chloride  10-40 mL Intracatheter Q12H  . Warfarin - Pharmacist  Dosing Inpatient   Does not apply q1800    Infusions: . sodium chloride 10 mL/hr at 08/14/15 0847  . phenylephrine (NEO-SYNEPHRINE) Adult infusion Stopped (08/10/15 1300)    PRN Medications: acetaminophen **OR** acetaminophen, fentaNYL (SUBLIMAZE) injection, HYDROmorphone (DILAUDID) injection, ondansetron, oxyCODONE-acetaminophen, sodium chloride, sodium chloride, traMADol   Assessment:   1. Abdominal pain and nausea: acute on chronic cholecystitis   --s/p percutaneous GB drain 12/8  2. Chronic systolic HF s/p HM II VAD 3. Acute on chronic renal failure, stage III-IV 4. HTN 5. PAF in NSR on amiodarone 6. Chronic anemia, iron deficient 7. Hypokalemia 8. Fever/nausea/vomiting 9. Anemia with acute blood loss - unclear source    --s/p 2units RBC on 12/8 10. Hypotension: Resolved.  11. Hypokalemia  Plan/Discussion:    S/p cholecystostomy tube. Suspect acute on chronic cholecystitis, ?passed stone.  Still significant drainage. 200 ccc output. Pain controlled with percocet.   Plan to leave drain in place, followup IR drain clinic.  No surgery at this time.  Culture showed klebsiella. Antibiotics narrowed to ceftriaxone.    Volume status stable. CVP ~6. Hold diuretics.  Takes prn torsemide at home, will hold off on diuretic today and continue to reassess.   MAP 80. Increase hydralazine to 37.5 mg tid.  Add  12. 5 spiro   Hemoglobin 8.6, no overt bleeding.  Got 2 units PRBCs this admission.  Continue to follow. INR too high, continue to hold coumadin (INR goal 2-2.5). INR  4.6 CBC pending.   PT consult appreciated. HHPT ordered.    Transfer to stepdown.   Amy Ninfa Meeker, NP-C  7:26 AM  08/15/2015  Improving slowly but still weak and having some discomfort. GB drain still draining. CX with Klebsiella. Now on ceftriaxone. Advancing diet as tolerated.   Volume status down so holding diuretics. MAP elevated so will increase hydralazine.   Very weak. Continue PT.   Hold coumadin for  INR 4.6, HGb stable.  VAD parameters stable.   Deunta Beneke,MD 4:09 PM

## 2015-08-16 DIAGNOSIS — I10 Essential (primary) hypertension: Secondary | ICD-10-CM

## 2015-08-16 LAB — CBC
HCT: 28.1 % — ABNORMAL LOW (ref 36.0–46.0)
Hemoglobin: 8.6 g/dL — ABNORMAL LOW (ref 12.0–15.0)
MCH: 24.6 pg — ABNORMAL LOW (ref 26.0–34.0)
MCHC: 30.6 g/dL (ref 30.0–36.0)
MCV: 80.3 fL (ref 78.0–100.0)
Platelets: 263 10*3/uL (ref 150–400)
RBC: 3.5 MIL/uL — ABNORMAL LOW (ref 3.87–5.11)
RDW: 24.8 % — ABNORMAL HIGH (ref 11.5–15.5)
WBC: 17.9 10*3/uL — ABNORMAL HIGH (ref 4.0–10.5)

## 2015-08-16 LAB — HEPATIC FUNCTION PANEL
ALT: 20 U/L (ref 14–54)
AST: 24 U/L (ref 15–41)
Albumin: 1.9 g/dL — ABNORMAL LOW (ref 3.5–5.0)
Alkaline Phosphatase: 103 U/L (ref 38–126)
Bilirubin, Direct: 0.1 mg/dL (ref 0.1–0.5)
Indirect Bilirubin: 0.4 mg/dL (ref 0.3–0.9)
Total Bilirubin: 0.5 mg/dL (ref 0.3–1.2)
Total Protein: 7.3 g/dL (ref 6.5–8.1)

## 2015-08-16 LAB — BASIC METABOLIC PANEL
Anion gap: 7 (ref 5–15)
BUN: 5 mg/dL — ABNORMAL LOW (ref 6–20)
CO2: 28 mmol/L (ref 22–32)
Calcium: 8.4 mg/dL — ABNORMAL LOW (ref 8.9–10.3)
Chloride: 103 mmol/L (ref 101–111)
Creatinine, Ser: 1.36 mg/dL — ABNORMAL HIGH (ref 0.44–1.00)
GFR calc Af Amer: 42 mL/min — ABNORMAL LOW (ref >60)
GFR calc non Af Amer: 36 mL/min — ABNORMAL LOW (ref >60)
Glucose, Bld: 95 mg/dL (ref 65–99)
Potassium: 3.9 mmol/L (ref 3.5–5.1)
Sodium: 138 mmol/L (ref 135–145)

## 2015-08-16 LAB — PROTIME-INR
INR: 3.87 — ABNORMAL HIGH (ref 0.00–1.49)
Prothrombin Time: 37.1 seconds — ABNORMAL HIGH (ref 11.6–15.2)

## 2015-08-16 LAB — LACTATE DEHYDROGENASE: LDH: 288 U/L — ABNORMAL HIGH (ref 98–192)

## 2015-08-16 MED ORDER — SPIRONOLACTONE 25 MG PO TABS
25.0000 mg | ORAL_TABLET | Freq: Every day | ORAL | Status: DC
  Administered 2015-08-16 – 2015-08-18 (×3): 25 mg via ORAL
  Filled 2015-08-16 (×3): qty 1

## 2015-08-16 NOTE — Progress Notes (Signed)
SATURATION QUALIFICATIONS: (This note is used to comply with regulatory documentation for home oxygen)  Patient Saturations on Room Air at Rest = 87-90%  Patient Saturations on Room Air while Ambulating = 84-89%  Patient Saturations on 3 Liters of oxygen while Ambulating = >90%  Please briefly explain why patient needs home oxygen:Pt needed O2 at rest and with ambulation to keep sats >90%. May need home O2. Thanks.  [place] [phone] (pager)

## 2015-08-16 NOTE — Progress Notes (Signed)
Patient ID: Brianna Hanson, female   DOB: 1935-08-31, 80 y.o.   MRN: [id number]    HeartMate 2 Rounding Note  Subjective:    Had abdominal US concerning for cholecystitis. HIDA scan-patent common bile duct, delayed filling gallbladder.  Possible chronic cholecytitis.  Cholecystostomy tube placed in IR, still significant drainage (200 cc yesterday).  Cultures --> Klebsiella . Antibiotics narrowed to ceftrixone.   Received 2 units PRBCs this admission,  Hemoglobin stable 8.6.   Last dose of was 12/9, INR too high today at 3.87.  No overt bleeding.   Denies SOB. Abdominal much improved. Ambulated 125 feet with PT.    LVAD INTERROGATION:  HeartMate II LVAD:  Flow 5.1 liters/min, speed 9200 , power 5.5, PI 7.2.  No PI PI event.   Objective:    Vital Signs:   Temp:  [97.8 F (36.6 C)-99.5 F (37.5 C)] 98.4 F (36.9 C) (12/14 0337) Pulse Rate:  [86] 86 (12/13 1600) Resp:  [17-22] 20 (12/14 0400) BP: (93-102)/(70-85) 100/83 mmHg (12/14 0400) SpO2:  [94 %-97 %] 94 % (12/14 0400) Weight:  [176 lb 2.4 oz (79.9 kg)] 176 lb 2.4 oz (79.9 kg) (12/14 0337) Last BM Date: 08/14/15 Mean arterial Pressure 80-90s  Intake/Output:   Intake/Output Summary (Last 24 hours) at 08/16/15 0723 Last data filed at 08/16/15 0500  Gross per 24 hour  Intake    880 ml  Output    140 ml  Net    740 ml     Physical Exam: CVP ~7 General:  In bed. NAD. No resp difficulty HEENT: normal Neck: supple. JVP flat. Carotids 2+ bilat; no bruits. No lymphadenopathy or thryomegaly appreciated. Cor: Mechanical heart sounds with LVAD hum present.  Lungs: clear Abdomen: soft, non tender. R quadrant perc tube.  LVAD driveline dressing. No erythema or drainage nondistended. No hepatosplenomegaly. No bruits or masses.  Driveline: C/D/I; securement device intact and driveline incorporated Extremities: no cyanosis, clubbing, rash, edema. RUE PICC. R and LLE SCDs.  Neuro: alert & orientedx3, cranial nerves grossly intact.  moves all 4 extremities w/o difficulty. Affect pleasant  Telemetry: NSR 60s  Labs: Basic Metabolic Panel:  Recent Labs Lab 08/12/15 0240 08/13/15 0315 08/14/15 0344 08/15/15 0415 08/16/15 0520  NA 137 139 137 140 138  K 3.8 3.4* 3.3* 3.5 3.9  CL 105 103 102 106 103  CO2 26 29 28 27 28   GLUCOSE 129* 115* 107* 78 95  BUN 10 9 8 6  5*  CREATININE 1.55* 1.60* 1.57* 1.41* 1.36*  CALCIUM 8.2* 8.4* 8.2* 8.2* 8.4*    Liver Function Tests:  Recent Labs Lab 08/10/15 0620 08/11/15 0400 08/13/15 0315 08/14/15 0344 08/16/15 0520  AST 51* 30 20 22 24   ALT 69* 50 28 25 20   ALKPHOS 138* 122 89 94 103  BILITOT 0.8 1.1 0.8 0.9 0.5  PROT 6.3* 6.4* 6.8 6.5 7.3  ALBUMIN 2.1* 2.2* 2.0* 2.0* 1.9*    Recent Labs Lab 08/10/15 0620  AMYLASE 42   No results for input(s): AMMONIA in the last 168 hours.  CBC:  Recent Labs Lab 08/10/15 0620  08/12/15 0240 08/13/15 0315 08/14/15 0344 08/15/15 0906 08/16/15 0520  WBC 14.1*  < > 14.1* 15.4* 13.9* 15.1* 17.9*  NEUTROABS 12.0*  --   --   --   --   --   --   HGB 6.5*  < > 9.0* 8.6* 8.6* 8.6* 8.6*  HCT 21.8*  < > 29.2* 28.0* 28.6* 28.5* 28.1*  MCV 76.8*  < >  78.1 78.4 79.0 79.8 80.3  PLT 162  < > 187 204 234 243 263  < > = values in this interval not displayed.  INR:  Recent Labs Lab 08/12/15 0240 08/13/15 0315 08/14/15 0344 08/15/15 0415 08/16/15 0520  INR 3.49* 4.45* 4.84* 4.66* 3.87*    Other results:    Imaging: No results found.   Medications:     Scheduled Medications: . amiodarone  200 mg Oral Daily  . antiseptic oral rinse  7 mL Mouth Rinse BID  . cefTRIAXone (ROCEPHIN)  IV  2 g Intravenous Q24H  . docusate sodium  100 mg Oral BID  . ferrous sulfate  325 mg Oral BID WC  . gabapentin  100 mg Oral QHS  . hydrALAZINE  37.5 mg Oral 3 times per day  . pantoprazole  40 mg Oral Daily  . sodium chloride  10-40 mL Intracatheter Q12H  . spironolactone  12.5 mg Oral Daily  . Warfarin - Pharmacist Dosing  Inpatient   Does not apply q1800    Infusions: . sodium chloride 10 mL/hr at 08/15/15 1500  . phenylephrine (NEO-SYNEPHRINE) Adult infusion Stopped (08/10/15 1300)    PRN Medications: acetaminophen **OR** acetaminophen, ondansetron, oxyCODONE-acetaminophen, sodium chloride, sodium chloride, traMADol   Assessment:   1. Abdominal pain and nausea: acute on chronic cholecystitis   --s/p percutaneous GB drain 12/8  2. Chronic systolic HF s/p HM II VAD 3. Acute on chronic renal failure, stage III-IV 4. HTN 5. PAF in NSR on amiodarone 6. Chronic anemia, iron deficient 7. Hypokalemia 8. Fever/nausea/vomiting 9. Anemia with acute blood loss - unclear source    --s/p 2units RBC on 12/8 10. Hypotension: Resolved.  11. Hypokalemia  Plan/Discussion:    S/p cholecystostomy tube. Suspect acute on chronic cholecystitis, ?passed stone.  Still significant drainage. 200 ccc output. Pain controlled with percocet.   Plan to leave drain in place, followup IR drain clinic.  No surgery at this time.  Culture showed klebsiella. Antibiotics narrowed to ceftriaxone.    Volume status stable. CVP ~7. Takes prn torsemide at home, will hold off on tprsemide today. Increasing spiro  MAP 80-90s Continue hydralazine to 37.5 mg tid.  Increase  Spiro 25 mg daily. Renal function stable.    Hemoglobin 8.6, no overt bleeding.  Got 2 units PRBCs this admission.  Continue to follow. INR too high, continue to hold coumadin (INR goal 2-2.5). INR  3.87.    PT consult appreciated. HHPT ordered.  OT will follow in hospital.    Transfer to 2W. Anticipate discharge 24-48 hours.   Amy Ninfa Meeker, NP-C  7:23 AM  08/16/2015  Patient seen and examined with Darrick Grinder, NP. We discussed all aspects of the encounter. I agree with the assessment and plan as stated above.   Still draining from C-tube. Cultures c/w Klebsiella. Continue ceftriaxone. WBC rising. But organism is sensitive to CTX. Will follow. Organism also sensitive  to cipro so can switch to po cipro on d/c.  Will d/w ID Pharmacy.   BP up. Volume status ok. Holding torsemide. Increasing spiro as BP high. Will transfer to 2W.   Continue to work with PT as she remains deconditioned.   CVP and VAD parameters obtained personally.  VAD parameters stable.   [name], Daniel,MD 9:47 AM

## 2015-08-16 NOTE — Progress Notes (Signed)
Pt walked with PT earlier today. Now feels nauseated, basin in lap in [place]. Declined ambulating. Will f/u tomorrow. Yves Dill CES, ACSM 2:08 PM 08/16/2015

## 2015-08-16 NOTE — Progress Notes (Signed)
Attempted to call report nurse is busy providing patient care.

## 2015-08-16 NOTE — Progress Notes (Signed)
ANTICOAGULATION CONSULT NOTE - Follow Up Consult  Pharmacy Consult for warfarin Indication:LVAD  No Known Allergies  Patient Measurements: Height: 5' (152.4 cm) Weight: 176 lb 2.4 oz (79.9 kg) IBW/kg (Calculated) : 45.5  Vital Signs: Temp: 98.4 F (36.9 C) (12/14 0337) Temp Source: Oral (12/14 0337) BP: 100/83 mmHg (12/14 0400)  Labs:  Recent Labs  08/14/15 0344 08/15/15 0415 08/15/15 0906 08/16/15 0520  HGB 8.6*  --  8.6* 8.6*  HCT 28.6*  --  28.5* 28.1*  PLT 234  --  243 263  LABPROT 43.8* 42.6*  --  37.1*  INR 4.84* 4.66*  --  3.87*  CREATININE 1.57* 1.41*  --  1.36*    Estimated Creatinine Clearance: 31.9 mL/min (by C-G formula based on Cr of 1.36).  Assessment: 80 yo f with LVAD placed in 2014. Pt on warfarin PTA.   INR is still elevated this AM but slowly trending down to 3.8. Hgb stable, plts ok. No bleeding noted.   Hold warfarin one more day.  Goal of Therapy:  INR 2-2.5 Monitor platelets by anticoagulation protocol: Yes   Plan:  Hold warfarin again tonight F/u INR in AM Monitor s/sx of bleeding  Erin Hearing PharmD., BCPS Clinical Pharmacist Pager [phone] 08/16/2015 7:37 AM

## 2015-08-16 NOTE — Progress Notes (Signed)
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 [name] (follwoed q 6 months). Underwent implantation of the HeartMate II LVAD on 04/06/13 for DT. She is not on aspirin due to dizziness.Had abdominal US concerning for cholecystitis. HIDA scan-patent common bile duct, delayed filling gallbladder. Possible chronic cholecytitis. Cholecystostomy tube placed in IR, still significant drainage (220 cc yesterday). Cultures negative so far.    PT Comments    Pt admitted with above diagnosis. Pt currently with functional limitations due to endurance deficits. Pt was able to ambulate but did need O2 at 3L to keep sats >90%.  Progressing well overall.   Pt will benefit from skilled PT to increase their independence and safety with mobility to allow discharge to the venue listed below.    Follow Up Recommendations  Home health PT;Supervision/Assistance - 24 hour     Equipment Recommendations  Other (comment) (possibly home O2)    Recommendations for Other Services       Precautions / Restrictions Precautions Precautions: Fall Precaution Comments: chole drain Restrictions Weight Bearing Restrictions: No    Mobility  Bed Mobility Overal bed mobility: Needs Assistance Bed Mobility: Supine to Sit     Supine to sit: Supervision        Transfers Overall transfer level: Needs assistance Equipment used: Rolling walker (2 wheeled) Transfers: Sit to/from Stand Sit to Stand: Supervision            Ambulation/Gait Ambulation/Gait assistance: Supervision Ambulation Distance (Feet): 225 Feet Assistive device: Rolling walker (2 wheeled) Gait Pattern/deviations: Step-through pattern;Decreased stride length;Antalgic;Trunk flexed   Gait  velocity interpretation: Below normal speed for age/gender General Gait Details: Pt was able to ambulate with RW in hallway without physical assist.  Cues for upright stance. Did not follow with chair.  Needed cues for pursed lip breathing and standing rest breaks due to desat at times.  Took 3LO2 to keep sats above 90%.    Stairs            Wheelchair Mobility    Modified Rankin (Stroke Patients Only)       Balance Overall balance assessment: Needs assistance Sitting-balance support: No upper extremity supported;Feet supported Sitting balance-Leahy Scale: Good     Standing balance support: Bilateral upper extremity supported;During functional activity Standing balance-Leahy Scale: Fair Standing balance comment: Pt can stand without UE support for up to a minute.                     Cognition Arousal/Alertness: Awake/alert Behavior During Therapy: Flat affect Overall Cognitive Status: Within Functional Limits for tasks assessed                      Exercises General Exercises - Lower Extremity Ankle Circles/Pumps: AROM;Both;10 reps;Seated Long Arc Quad: AROM;Both;10 reps;Seated    General Comments General comments (skin integrity, edema, etc.): Pt able to instruct caregiver in all parts needed for LVAD.  Pt I with LVAD.       Pertinent Vitals/Pain Pain Assessment: No/denies pain     SATURATION QUALIFICATIONS: (This note is used to comply with regulatory documentation for home oxygen)  Patient Saturations on Room Air at Rest = 87-90%  Patient Saturations on Room Air while Ambulating = 84-89%  Patient Saturations on 3 Liters of oxygen while Ambulating = >90%  Please briefly explain why patient needs home oxygen:Pt needed O2 at rest and with ambulation to keep sats >90%. May need home O2.  Home Living                      Prior Function            PT Goals (current goals can now be found in the care plan section) Progress towards PT  goals: Progressing toward goals    Frequency  Min 3X/week    PT Plan Current plan remains appropriate    Co-evaluation             End of Session Equipment Utilized During Treatment: Gait belt;Oxygen Activity Tolerance: Patient limited by fatigue Patient left: in chair;with call bell/phone within reach     Time: 1114-1150 PT Time Calculation (min) (ACUTE ONLY): 36 min  Charges:  $Gait Training: 8-22 mins $Therapeutic Exercise: 8-22 mins                    G Codes:      Sanye Ledesma F 09-15-2015, 1:13 PM [employer] [facility] [phone] (pager)

## 2015-08-16 NOTE — Progress Notes (Signed)
Report called to [facility] RN on unit 2W

## 2015-08-17 LAB — CBC
HCT: 29 % — ABNORMAL LOW (ref 36.0–46.0)
Hemoglobin: 8.5 g/dL — ABNORMAL LOW (ref 12.0–15.0)
MCH: 23.5 pg — ABNORMAL LOW (ref 26.0–34.0)
MCHC: 29.3 g/dL — ABNORMAL LOW (ref 30.0–36.0)
MCV: 80.3 fL (ref 78.0–100.0)
Platelets: 301 10*3/uL (ref 150–400)
RBC: 3.61 MIL/uL — ABNORMAL LOW (ref 3.87–5.11)
RDW: 24.9 % — ABNORMAL HIGH (ref 11.5–15.5)
WBC: 16.7 10*3/uL — ABNORMAL HIGH (ref 4.0–10.5)

## 2015-08-17 LAB — LACTATE DEHYDROGENASE: LDH: 283 U/L — ABNORMAL HIGH (ref 98–192)

## 2015-08-17 LAB — PROTIME-INR
INR: 2.92 — ABNORMAL HIGH (ref 0.00–1.49)
Prothrombin Time: 30 seconds — ABNORMAL HIGH (ref 11.6–15.2)

## 2015-08-17 MED ORDER — HYDRALAZINE HCL 50 MG PO TABS
50.0000 mg | ORAL_TABLET | Freq: Three times a day (TID) | ORAL | Status: DC
  Administered 2015-08-17 – 2015-08-18 (×4): 50 mg via ORAL
  Filled 2015-08-17 (×4): qty 1

## 2015-08-17 MED ORDER — WARFARIN SODIUM 1 MG PO TABS
1.0000 mg | ORAL_TABLET | Freq: Once | ORAL | Status: AC
  Administered 2015-08-17: 1 mg via ORAL
  Filled 2015-08-17: qty 1

## 2015-08-17 NOTE — Progress Notes (Signed)
ANTICOAGULATION CONSULT NOTE - Follow Up Consult  Pharmacy Consult for warfarin Indication:LVAD  No Known Allergies  Patient Measurements: Height: 5' (152.4 cm) Weight: 173 lb 8 oz (78.7 kg) IBW/kg (Calculated) : 45.5  Vital Signs: Temp: 98.3 F (36.8 C) (12/15 0442) Temp Source: Oral (12/15 0442) BP: 85/0 mmHg (12/15 0800)  Labs:  Recent Labs  08/15/15 0415  08/15/15 0906 08/16/15 0520 08/17/15 0540  HGB  --   < > 8.6* 8.6* 8.5*  HCT  --   --  28.5* 28.1* 29.0*  PLT  --   --  243 263 301  LABPROT 42.6*  --   --  37.1* 30.0*  INR 4.66*  --   --  3.87* 2.92*  CREATININE 1.41*  --   --  1.36*  --   < > = values in this interval not displayed.  Estimated Creatinine Clearance: 31.6 mL/min (by C-G formula based on Cr of 1.36).  Assessment: 80 yo f with LVAD placed in 2014. Pt on warfarin PTA.   INR is trending down this AM to 2.92. Hgb stable, plts ok. No bleeding noted.   Restart low dose warfarin tonight.  PTA warfarin dose 2.5mg  daily except 5mg  on Wednesdays   Goal of Therapy:  INR 2-2.5 Monitor platelets by anticoagulation protocol: Yes   Plan:  Warfarin 1mg  tonight F/u INR in AM Monitor s/sx of bleeding  Erin Hearing PharmD., BCPS Clinical Pharmacist Pager [phone] 08/17/2015 10:40 AM

## 2015-08-17 NOTE — Progress Notes (Signed)
CARDIAC REHAB PHASE I   PRE:  Rate/Rhythm: 107 ST up in room    BP: sitting 84 MAP    SaO2: 97 1L  MODE:  Ambulation: 150 ft   POST:  Rate/Rhythm: 117 ST78    BP: sitting 78 MAP     SaO2: 87 2L, up to 91 2L with rest and PLB  Pt up in room on arrival cleaning up with s/o help. Sts she still feels weak and tired. Requested walking with 2 people. Pt steady with RW, no assist needed except for with equipment. Attempted walking on 1L however SaO2 down to 87 after 75 ft. Slowly up to 91 on 2L. Pt ambulated 75 ft more, declined walking any further (had walked 225 ft yest). SaO2 after walking in recliner 87 2L. Took 3L and 4 min to increase to 91. Left pt on 1L in room. She does not have O2 at home. Gave IS and instructions on use for today. Left pt in recliner with s/o assisting her with battery changeover.  [id number]   Brianna Hanson CES, ACSM 08/17/2015 10:54 AM

## 2015-08-17 NOTE — Progress Notes (Signed)
Patient ID: Brianna Hanson, female   DOB: 05-Mar-1935, 80 y.o.   MRN: [id number]    HeartMate 2 Rounding Note  Subjective:     Transferred to 2W last night. Feeling better. No ab pain. Biliary drain slwoing down. Able to walk to bathroom by herself but still weak.  No fevers. White count coming down slowly. INR now 2.9  Weight stable173  LVAD INTERROGATION:  HeartMate II LVAD:  Flow 4.9 liters/min, speed 9200 , power 5.4, PI 7.2.  No PI PI event.   Objective:    Vital Signs:   Temp:  [97.7 F (36.5 C)-98.7 F (37.1 C)] 98.3 F (36.8 C) (12/15 0442) Resp:  [17-21] 20 (12/15 0442) BP: (89-113)/(64-85) 89/74 mmHg (12/14 1700) SpO2:  [95 %-100 %] 100 % (12/15 0442) Weight:  [78.7 kg (173 lb 8 oz)] 78.7 kg (173 lb 8 oz) (12/15 0442) Last BM Date: 08/14/15 Mean arterial Pressure 80-90s  Intake/Output:   Intake/Output Summary (Last 24 hours) at 08/17/15 0659 Last data filed at 08/17/15 0506  Gross per 24 hour  Intake    640 ml  Output    700 ml  Net    -60 ml     Physical Exam: General:  In bed. NAD. No resp difficulty HEENT: normal Neck: supple. JVP 6-7. Carotids 2+ bilat; no bruits. No lymphadenopathy or thryomegaly appreciated. Cor: Mechanical heart sounds with LVAD hum present.  Lungs: clear Abdomen: soft, non tender. R quadrant perc tube.  LVAD driveline dressing. No erythema or drainage nondistended. No hepatosplenomegaly. No bruits or masses.  Driveline: C/D/I; securement device intact and driveline incorporated Extremities: no cyanosis, clubbing, rash, edema. RUE PICC. R and LLE SCDs.  Neuro: alert & orientedx3, cranial nerves grossly intact. moves all 4 extremities w/o difficulty. Affect pleasant  Telemetry: NSR 60s  Labs: Basic Metabolic Panel:  Recent Labs Lab 08/12/15 0240 08/13/15 0315 08/14/15 0344 08/15/15 0415 08/16/15 0520  NA 137 139 137 140 138  K 3.8 3.4* 3.3* 3.5 3.9  CL 105 103 102 106 103  CO2 26 29 28 27 28   GLUCOSE 129* 115* 107* 78 95    BUN 10 9 8 6  5*  CREATININE 1.55* 1.60* 1.57* 1.41* 1.36*  CALCIUM 8.2* 8.4* 8.2* 8.2* 8.4*    Liver Function Tests:  Recent Labs Lab 08/11/15 0400 08/13/15 0315 08/14/15 0344 08/16/15 0520  AST 30 20 22 24   ALT 50 28 25 20   ALKPHOS 122 89 94 103  BILITOT 1.1 0.8 0.9 0.5  PROT 6.4* 6.8 6.5 7.3  ALBUMIN 2.2* 2.0* 2.0* 1.9*   No results for input(s): LIPASE, AMYLASE in the last 168 hours. No results for input(s): AMMONIA in the last 168 hours.  CBC:  Recent Labs Lab 08/13/15 0315 08/14/15 0344 08/15/15 0906 08/16/15 0520 08/17/15 0540  WBC 15.4* 13.9* 15.1* 17.9* 16.7*  HGB 8.6* 8.6* 8.6* 8.6* 8.5*  HCT 28.0* 28.6* 28.5* 28.1* 29.0*  MCV 78.4 79.0 79.8 80.3 80.3  PLT 204 234 243 263 301    INR:  Recent Labs Lab 08/13/15 0315 08/14/15 0344 08/15/15 0415 08/16/15 0520 08/17/15 0540  INR 4.45* 4.84* 4.66* 3.87* 2.92*    Other results:    Imaging: No results found.   Medications:     Scheduled Medications: . amiodarone  200 mg Oral Daily  . antiseptic oral rinse  7 mL Mouth Rinse BID  . cefTRIAXone (ROCEPHIN)  IV  2 g Intravenous Q24H  . docusate sodium  100 mg Oral BID  .  ferrous sulfate  325 mg Oral BID WC  . gabapentin  100 mg Oral QHS  . hydrALAZINE  37.5 mg Oral 3 times per day  . pantoprazole  40 mg Oral Daily  . sodium chloride  10-40 mL Intracatheter Q12H  . spironolactone  25 mg Oral Daily  . Warfarin - Pharmacist Dosing Inpatient   Does not apply q1800    Infusions: . sodium chloride 10 mL/hr at 08/15/15 1500    PRN Medications: acetaminophen **OR** acetaminophen, ondansetron, oxyCODONE-acetaminophen, sodium chloride, sodium chloride, traMADol   Assessment:   1. Abdominal pain and nausea: acute on chronic cholecystitis   --s/p percutaneous GB drain 12/8  2. Chronic systolic HF s/p HM II VAD 3. Acute on chronic renal failure, stage III-IV 4. HTN 5. PAF in NSR on amiodarone 6. Chronic anemia, iron deficient 7.  Hypokalemia 8. Fever/nausea/vomiting 9. Anemia with acute blood loss - unclear source    --s/p 2units RBC on 12/8 10. Hypotension: Resolved.  11. Hypokalemia  Plan/Discussion:     Still draining from C-tube. Cultures c/w Klebsiella. Continue ceftriaxone. (Organism is sensitive to CTX.) WBC coming down slowly. Organism also sensitive to cipro so can switch to po cipro on d/c.    Volume status ok. Holding torsemide on spiro. MAP 80-96 will increase hydral to 50  Continue to work with PT as she remains deconditioned. Possibly home tomorrow.   CVP and VAD parameters obtained personally.  VAD parameters stable.   Laquitha Heslin,MD 6:59 AM

## 2015-08-17 NOTE — Progress Notes (Signed)
Consult to the drain clinic needs to be placed for out patient drain management. Attempted to page PA for IR but no response back. Per drain clinic no availability for December. Caregiver will need training regarding dressing care for drain. Also will need to clarify plan for duration of drain placement and specific care instructions. Will attempt to get in touch with [facility] tomorrow.

## 2015-08-17 NOTE — Progress Notes (Signed)
Admitted 08/06/15 due to abdominal pain, nausea and vomiting.   HeartMate II LVAD implated on 04/06/13 by Dr. Prescott Gum for DT.   Feeling good this morning. Brianna Hanson is with her at the bedside and ready for a bath. She has been walking more steady with the walker. Still with oxygen 2 - 3 LPM to keep sats up and may go home with it. Brianna Hanson is asking about Drain Clinic--will contact them to see where they are located and what services they offer regarding dressing care.   Vital signs: Temp: 98.3 HR:80s Doppler BP: 85 - 96 Automated BP: not done--was previously picking up and correlating with a systolic  MAP previously in the 70s on automated cuff O2 Sat: >95% on 2 LPM Wt:  169 > ... > 176 > 176 lb   LVAD interrogation reveals:  Speed: 9200 Flow: 5.1 Power: 5.5w PI: 6.9  Alarms: none Events: rare to none Fixed speed: 9200 Low speed limit: 8600  Back up equipment present and set accordingly   Drive Line: C/D/I. Changed 08/10/15 with weekly kit. Will change today.    Labs:  LDH trend: 277 > ... > 407 (elevated LFTs) > ...> 285 > 288 > 283  INR trend: 2.25 > 2.15 > 2.45 > 3.87 > 2.92  Previously > 5 requiring FFP infusions  WBC trend: 15.1 > 17.9 > 16.7  Blood Products: Was having bleeding from hemorrhoids with anemia pre-admission recently and assumed to be continued source requiring transfusion.   08/10/15 --> 2 u PRBC   Antiinfectives: 08/17/15 >> GB culture grew out few Klebsiella. Sensitive to Cipro.   Plan/Recommendations:  1. Hemoglobin stable. Back on home PO Iron BID  2. Physical therapy for rehab to prevent further deconditioning--> PT recommending home PT and supervision. Being set up and she is open to this plan. Tentative discharge plan for Friday 08/18/15.    3.Outpatient drain management in the Drain Clinic--> Located at [facility]. Will call scheduler today at [phone] to determine the services and contact information regarding referral for  outpatient management.   Janene Madeira, RN, BSN, CCRN  VAD Coordinator   Office: [phone] 24/7 [place] Pager: [phone]

## 2015-08-17 NOTE — Care Management Important Message (Signed)
Important Message  Patient Details  Name: Brianna Hanson MRN: [id number] Date of Birth: March 06, 1935   Medicare Important Message Given:  Yes    [name] 08/17/2015, 2:38 [name] Message  Patient Details  Name: Brianna Hanson MRN: [id number] Date of Birth: 15-Apr-1935   Medicare Important Message Given:  Yes    [name] G 08/17/2015, 2:38 PM

## 2015-08-18 DIAGNOSIS — K812 Acute cholecystitis with chronic cholecystitis: Secondary | ICD-10-CM | POA: Diagnosis not present

## 2015-08-18 LAB — BASIC METABOLIC PANEL
Anion gap: 7 (ref 5–15)
BUN: 7 mg/dL (ref 6–20)
CO2: 26 mmol/L (ref 22–32)
Calcium: 8.1 mg/dL — ABNORMAL LOW (ref 8.9–10.3)
Chloride: 103 mmol/L (ref 101–111)
Creatinine, Ser: 1.46 mg/dL — ABNORMAL HIGH (ref 0.44–1.00)
GFR calc Af Amer: 39 mL/min — ABNORMAL LOW (ref >60)
GFR calc non Af Amer: 33 mL/min — ABNORMAL LOW (ref >60)
Glucose, Bld: 84 mg/dL (ref 65–99)
Potassium: 3.8 mmol/L (ref 3.5–5.1)
Sodium: 136 mmol/L (ref 135–145)

## 2015-08-18 LAB — CBC
HCT: 26.7 % — ABNORMAL LOW (ref 36.0–46.0)
Hemoglobin: 8.3 g/dL — ABNORMAL LOW (ref 12.0–15.0)
MCH: 24.9 pg — ABNORMAL LOW (ref 26.0–34.0)
MCHC: 31.1 g/dL (ref 30.0–36.0)
MCV: 80.2 fL (ref 78.0–100.0)
Platelets: 313 10*3/uL (ref 150–400)
RBC: 3.33 MIL/uL — ABNORMAL LOW (ref 3.87–5.11)
RDW: 25.1 % — ABNORMAL HIGH (ref 11.5–15.5)
WBC: 14.2 10*3/uL — ABNORMAL HIGH (ref 4.0–10.5)

## 2015-08-18 LAB — PROTIME-INR
INR: 2.62 — ABNORMAL HIGH (ref 0.00–1.49)
Prothrombin Time: 27.6 seconds — ABNORMAL HIGH (ref 11.6–15.2)

## 2015-08-18 LAB — LACTATE DEHYDROGENASE: LDH: 281 U/L — ABNORMAL HIGH (ref 98–192)

## 2015-08-18 MED ORDER — CIPROFLOXACIN HCL 500 MG PO TABS: 500.0000 mg | ORAL_TABLET | Freq: Two times a day (BID) | ORAL | Status: DC

## 2015-08-18 MED ORDER — INFLUENZA VAC SPLIT QUAD 0.5 ML IM SUSY
0.5000 mL | PREFILLED_SYRINGE | Freq: Once | INTRAMUSCULAR | Status: AC
  Administered 2015-08-18: 0.5 mL via INTRAMUSCULAR

## 2015-08-18 MED ORDER — WARFARIN SODIUM 2.5 MG PO TABS
2.5000 mg | ORAL_TABLET | Freq: Once | ORAL | Status: DC
Start: 2015-08-18 — End: 2015-08-18

## 2015-08-18 MED ORDER — CIPROFLOXACIN HCL 500 MG PO TABS
500.0000 mg | ORAL_TABLET | Freq: Two times a day (BID) | ORAL | Status: DC
  Administered 2015-08-18: 500 mg via ORAL
  Filled 2015-08-18: qty 1

## 2015-08-18 MED ORDER — HYDRALAZINE HCL 100 MG PO TABS: 50.0000 mg | ORAL_TABLET | Freq: Three times a day (TID) | ORAL | Status: DC

## 2015-08-18 MED ORDER — OXYCODONE-ACETAMINOPHEN 5-325 MG PO TABS: 1.0000 | ORAL_TABLET | Freq: Four times a day (QID) | ORAL | Status: DC | PRN

## 2015-08-18 NOTE — Care Management Note (Signed)
Case Management Note Previous CM note initiated by Donne Anon RN CM  Patient Details  Name: Brianna Hanson MRN: [id number] Date of Birth: [birth year]  Subjective/Objective:       Adm w abd pain, has lvad            Action/Plan: lives w fam, pcp dr Ronnald Ramp   Expected Discharge Date:                  Expected Discharge Plan:  [place]  In-House Referral:     Discharge planning Services  CM Consult  Post Acute Care Choice:  [place], Durable Medical Equipment Choice offered to:  Patient  DME Arranged:  Oxygen DME Agency:  [place]:  RN, PT [facility] Agency:  [place]  Status of Service:  In process, will continue to follow  Medicare Important Message Given:  Yes Date Medicare IM Given:    Medicare IM give by:    Date Additional Medicare IM Given:    Additional Medicare Important Message give by:     If discussed at [place] of Stay Meetings, dates discussed:    Discharge Disposition: Home with Home Health   Additional Comments: pt has used [facility] in past and would like to use them again for hhc. Ref to donna w [facility] for hhrn and hhpt  08/18/15-  Pt has been set up with [facility] for HH-RN/PT- now has orders for home 02- have spoken with Jermaine with [facility] regarding DME needs- portable tank to be delivered to room prior to discharge.   Dawayne [name], RN 08/18/2015, 1:24 PM

## 2015-08-18 NOTE — Discharge Summary (Signed)
Advanced Heart Failure Team  Discharge Summary   Patient ID: Brianna Hanson MRN: [id number], DOB/AGE: [birth year] 80 y.o. Admit date: 08/06/2015 D/C date:     08/18/2015   Primary Discharge Diagnoses:  1. Abdominal pain and nausea: acute on chronic cholecystitis  --s/p percutaneous GB drain 12/8  - Klebsiella. - Continue Cipron 500 mg twice a day for 30 days.  2. Chronic systolic HF s/p HM II VAD 3. Acute on chronic renal failure, stage III-IV 4. HTN 5. PAF in NSR on amiodarone 6. Chronic anemia, iron deficient 7. Hypokalemia 8. Fever/nausea/vomiting 9. Anemia with acute blood loss - unclear source  --s/p 2units RBC on 12/8 10. Hypotension: Resolved.  11. Hypokalemia  Hospital Course:  Brianna Hanson is a 80 year old female with a PMH of HF due to severe NICM, chronic systolic HF,PAF, plasma cell disorder (Likely IgA MGUS) - followed by Dr. [name] (follwoed q 6 months). Underwent implantation of the HeartMate II LVAD on 04/06/13 for DT. She is not on aspirin due to dizziness.  Admitted on December 4th with abdominal  pain and nausea after putting up Christmas decorations. Initially she had tenderness along drive. Drive line itself was ok with no evidence of infection.  On admit CT scan of abd/pelvis was normal. Creatinine started to trend up from 1.1 to 1.8 so diuretics were held and IV fluids were given. Coumadin was held several days due to elevated INR. INR peaked 4.8. She later developed fevers (T 101) , RLQ/RUQ pain, nausea, and vomiting.  WBC peaked at 24. Maps started to drop 60-70s so she required short term Neo-synephrine. She was started on vanc and zosyn.   Due to declining condition she was transferred to ICU. Repeat abdominal ultrasound was performed due to concern for acute cholecystitis. This was followed by HIDA scan that showed patent bile duct but due to progressive abdominal pain there was concern for acute cholecystitis. IR was consulted and on December 8th a perc  cholecystostomy was placed with cultures obtained. Abdominal pain dramatically improved. She had ongoing drainage with 100-200 cc per day. Cultures from cholecystostomy grew klebsiella so antibiotics were narrowed to ceftriaxone and on discharge she was transitioned to cipro 500 mg twice a day with plans to continue for 30 days.    On December 8th hemoglobin was 6.5 so she was given 2 UPRBCs. No obivious source of bleeding was identified. As she improved coumadin was restarted.  From HF perspective BP meds were cut back and amlodipine, carvedilol, and losartan stopped due to soft BP. She will continue on spironolactone and reduced dose of hydralazine. She will only take torsemide as needed. Due to deconditioning PT consulted with recommendations for HHPT as well as home oxygen.    She will be followed closely in the HF/LVAD clinic and will be seen next week. Plan to check labs at that time. We will watch INR closely while she is on antibiotics.  [facility] will set up follow up. She will follow with General Surgery for possible cholecystectomy down the road. [facility] will follow for HHRN and HHPT.    Consult: General Surgery  Consult: IR   LVAD Interrogation HM II: Flow 5.1 liters/min, speed 9200 , power 6 PI 6.4. No PI events.    Discharge Weight Range: 173 pounds  Discharge Vitals: Blood pressure 95/0, pulse 82, temperature 98.1 F (36.7 C), temperature source Oral, resp. rate 18, height 5' (1.524 m), weight 173 lb 3.2 oz (78.563 kg), SpO2 96 %.  Labs: Lab Results  Component Value Date   WBC 14.2* 08/18/2015   HGB 8.3* 08/18/2015   HCT 26.7* 08/18/2015   MCV 80.2 08/18/2015   PLT 313 08/18/2015    Recent Labs Lab 08/16/15 0520 08/18/15 0430  NA 138 136  K 3.9 3.8  CL 103 103  CO2 28 26  BUN 5* 7  CREATININE 1.36* 1.46*  CALCIUM 8.4* 8.1*  PROT 7.3  --   BILITOT 0.5  --   ALKPHOS 103  --   ALT 20  --   AST 24  --   GLUCOSE 95 84   Lab Results  Component Value Date    CHOL  05/05/2010    130        ATP III CLASSIFICATION:  <200     mg/dL   Desirable  200-239  mg/dL   Borderline High  >=240    mg/dL   High          HDL 36* 05/05/2010   LDLCALC  05/05/2010    76        Total Cholesterol/HDL:CHD Risk Coronary Heart Disease Risk Table                     Men   Women  1/2 Average Risk   3.4   3.3  Average Risk       5.0   4.4  2 X Average Risk   9.6   7.1  3 X Average Risk  23.4   11.0        Use the calculated Patient Ratio above and the CHD Risk Table to determine the patient's CHD Risk.        ATP III CLASSIFICATION (LDL):  <100     mg/dL   Optimal  100-129  mg/dL   Near or Above                    Optimal  130-159  mg/dL   Borderline  160-189  mg/dL   High  >190     mg/dL   Very High   TRIG 90 05/05/2010   BNP (last 3 results)  Recent Labs  01/10/15 1000 05/11/15 1153 07/11/15 1005  BNP 162.1* 214.5* 276.6*    ProBNP (last 3 results) No results for input(s): PROBNP in the last 8760 hours.   Diagnostic Studies/Procedures   No results found.  Discharge Medications     Medication List    STOP taking these medications        amLODipine 10 MG tablet  Commonly known as:  NORVASC     carvedilol 12.5 MG tablet  Commonly known as:  COREG     losartan 50 MG tablet  Commonly known as:  COZAAR      TAKE these medications        amiodarone 200 MG tablet  Commonly known as:  PACERONE  TAKE 1 TABLET BY MOUTH EVERY DAY     ciprofloxacin 500 MG tablet  Commonly known as:  CIPRO  Take 1 tablet (500 mg total) by mouth 2 (two) times daily.     docusate sodium 100 MG capsule  Commonly known as:  COLACE  Take 1 capsule (100 mg total) by mouth 2 (two) times daily.     ferrous sulfate 325 (65 FE) MG tablet  Take 1 tablet (325 mg total) by mouth 2 (two) times daily with a meal.     gabapentin 100 MG capsule  Commonly  known as:  NEURONTIN  TAKE 1 CAPSULE BY MOUTH AT BEDTIME OR AS DIRECTED     hydrALAZINE 100 MG tablet   Commonly known as:  APRESOLINE  Take 0.5 tablets (50 mg total) by mouth 3 (three) times daily.     oxyCODONE-acetaminophen 5-325 MG tablet  Commonly known as:  PERCOCET/ROXICET  Take 1 tablet by mouth every 6 (six) hours as needed for moderate pain or severe pain.     pantoprazole 40 MG tablet  Commonly known as:  PROTONIX  TAKE 1 TABLET BY MOUTH EVERY DAY     spironolactone 25 MG tablet  Commonly known as:  ALDACTONE  TAKE 1 TABLET BY MOUTH EVERY DAY     torsemide 20 MG tablet  Commonly known as:  DEMADEX  Take 1 tablet (20 mg total) by mouth daily as needed. weight gain 3 lbs in 24 hours     traMADol 50 MG tablet  Commonly known as:  ULTRAM  Take 1 tablet (50 mg total) by mouth every 6 (six) hours as needed for moderate pain. for pain     Vitamin D3 1000 UNITS Caps  Take 1 tablet by mouth daily.     warfarin 2.5 MG tablet  Commonly known as:  COUMADIN  Take 2.5-5 mg by mouth daily. 2.5mg  daily except 5mg  on Wednesdays        Disposition   The patient will be discharged in stable condition to home.     Discharge Instructions    Diet - low sodium heart healthy    Complete by:  As directed      Heart Failure patients record your daily weight using the same scale at the same time of day    Complete by:  As directed      Increase activity slowly    Complete by:  As directed           Follow-up Information    Follow up with [facility].   Why:  HH-RN/PT arranged- nse will call after disch to set up appt   Contact information:   [address] [place] [address] [phone]       Follow up with Judeth Horn, MD On 09/05/2015.   Specialty:  General Surgery   Why:  at 11:20 appointment but you will need to arrive 30 minutes prior to paper work. Bring picture ID and insurance card.    Contact information:   [address] [place] [address] [phone]       Follow up with Glori Bickers, MD On 08/21/2015.    Specialty:  Cardiology   Why:  at 2:00. Garage Code [id number]   Contact information:   [place] [address] [phone]       Follow up with [facility].   Why:  Home- 02 arranged- portable tank to be delivered to room prior to discharge.   Contact information:   [name] [place] [address] [phone]         Duration of Discharge Encounter: Greater than 35 minutes   Signed, Amy Clegg NP-C   08/18/2015, 1:32 PM  Patient seen and examined with Darrick Grinder, NP. We discussed all aspects of the encounter. I agree with the assessment and plan as stated above.   She is improved. Stable for d/c. F/u arranged in IR drain clinic and with [facility]. Will see back next week in VAD Clinic.  [name], Daniel,MD 5:32 PM

## 2015-08-18 NOTE — Progress Notes (Signed)
Occupational Therapy Treatment Patient Details Name: Brianna Hanson MRN: [id number] DOB: [birth year] Today's Date: 08/18/2015    History of present illness [name] is a 80 year old female with a PMH of HF due to severe NICM, chronic systolic HF,PAF, plasma cell disorder (Likely IgA MGUS) - followed by Dr. [name] (follwoed q 6 months). Underwent implantation of the HeartMate II LVAD on 04/06/13 for DT. She is not on aspirin due to dizziness.Had abdominal US concerning for cholecystitis. HIDA scan-patent common bile duct, delayed filling gallbladder. Possible chronic cholecytitis. Cholecystostomy tube placed in IR, still significant drainage (220 cc yesterday). Cultures negative so far.   OT comments  Pt eager to return home. Performing ADL near her baseline.  Pt knowledgeable in safety and energy conservation. Practiced purse lip breathing as pt will take 02 home.  Follow Up Recommendations  No OT follow up;Supervision/Assistance - 24 hour    Equipment Recommendations  None recommended by OT    Recommendations for Other Services      Precautions / Restrictions Precautions Precautions: Fall Precaution Comments: chole drain       Mobility Bed Mobility               General bed mobility comments: Pt in chair.  Transfers                      Balance                                   ADL Overall ADL's : Needs assistance/impaired     Grooming: Wash/dry hands;Wash/dry face;Oral care;Supervision/safety;Standing   Upper Body Bathing: Set up;Sitting   Lower Body Bathing: Minimal assistance;Sit to/from stand   Upper Body Dressing : Set up;Sitting       Toilet Transfer: Supervision/safety;RW;Ambulation   Toileting- Clothing Manipulation and Hygiene: Supervision/safety;Sit to/from stand         General ADL Comments: Pt knowledgeable in energy conservation strategies and employs independently.      Vision                      Perception     Praxis      Cognition   Behavior During Therapy: WFL for tasks assessed/performed Overall Cognitive Status: Within Functional Limits for tasks assessed                       Extremity/Trunk Assessment               Exercises     Shoulder Instructions       General Comments      Pertinent Vitals/ Pain       Pain Assessment: Faces Faces Pain Scale: Hurts a little bit Pain Location: drain insertion site Pain Descriptors / Indicators: Burning Pain Intervention(s): Monitored during session  Home Living                                          Prior Functioning/Environment              Frequency Min 2X/week     Progress Toward Goals  OT Goals(current goals can now be found in the care plan section)  Progress towards OT goals: Progressing toward goals  Acute Rehab OT Goals Patient Stated Goal: to feel  better and go home   Plan Discharge plan remains appropriate    Co-evaluation                 End of Session Equipment Utilized During Treatment: Rolling walker   Activity Tolerance Patient tolerated treatment well   Patient Left in chair;with call bell/phone within reach   Nurse Communication          Time: 1132-1201 OT Time Calculation (min): 29 min  Charges: OT General Charges $OT Visit: 1 Procedure OT Treatments $Self Care/Home Management : 23-37 mins  Malka So 08/18/2015, 12:09 PM  [phone]

## 2015-08-18 NOTE — Progress Notes (Signed)
Physical Therapy Treatment Patient Details Name: Brianna Hanson MRN: [id number] DOB: [birth year] Today's Date: 08/18/2015    History of Present Illness Brianna Hanson is a 80 year old female with a PMH of HF due to severe NICM, chronic systolic HF,PAF, plasma cell disorder (Likely IgA MGUS) - followed by Dr. [name] (follwoed q 6 months). Underwent implantation of the HeartMate II LVAD on 04/06/13 for DT. She is not on aspirin due to dizziness.Had abdominal US concerning for cholecystitis. HIDA scan-patent common bile duct, delayed filling gallbladder. Possible chronic cholecytitis. Cholecystostomy tube placed in IR, still significant drainage (220 cc yesterday). Cultures negative so far.    PT Comments    Continues to make steady progress. Pt did require 3L supplemental O2 with gait to maintain SpO2 90-95%. No physical assist for transfer, gait, or LVAD manipulation. Husband very supportive and knowledgeable of LVAD. Will follow until d/c.  Follow Up Recommendations  Home health PT;Supervision/Assistance - 24 hour     Equipment Recommendations  Other (comment) (home O2)    Recommendations for Other Services       Precautions / Restrictions Precautions Precautions: Fall Precaution Comments: chole drain Restrictions Weight Bearing Restrictions: No    Mobility  Bed Mobility               General bed mobility comments: sitting in chair  Transfers Overall transfer level: Needs assistance Equipment used: Rolling walker (2 wheeled) Transfers: Sit to/from Stand Sit to Stand: Supervision         General transfer comment: supervision for safety. VC for hand placement. No physical assist needed.  Ambulation/Gait Ambulation/Gait assistance: Supervision Ambulation Distance (Feet): 150 Feet Assistive device: Rolling walker (2 wheeled) Gait Pattern/deviations: Step-through pattern;Decreased stride length;Trunk flexed;Trendelenburg Gait velocity: decreased Gait velocity  interpretation: Below normal speed for age/gender General Gait Details: Lt trendelenburg. Without overt loss of balance using the RW for support. SpO2 84% on 2L, 90-95% on 3L supplemental O2. Required 2 standing rest breaks to complete distance. 3/4 dyspnea, improves with cues for pursed lip breathing and standing rest break.   Stairs            Wheelchair Mobility    Modified Rankin (Stroke Patients Only)       Balance                                    Cognition Arousal/Alertness: Awake/alert Behavior During Therapy: Flat affect Overall Cognitive Status: Within Functional Limits for tasks assessed                      Exercises      General Comments General comments (skin integrity, edema, etc.): Independently manages LVAD without difficulty. Cues to bring back-up kit.      Pertinent Vitals/Pain Pain Assessment: Faces Faces Pain Scale: Hurts a little bit Pain Location: drain site Pain Descriptors / Indicators: Sore Pain Intervention(s): Monitored during session;Repositioned    Home Living                      Prior Function            PT Goals (current goals can now be found in the care plan section) Acute Rehab PT Goals Patient Stated Goal: to feel better and go home  PT Goal Formulation: With patient Time For Goal Achievement: 08/28/15 Potential to Achieve Goals: Good Progress towards PT goals: Progressing toward  goals    Frequency  Min 3X/week    PT Plan Current plan remains appropriate    Co-evaluation             End of Session Equipment Utilized During Treatment: Gait belt;Oxygen Activity Tolerance: Patient tolerated treatment well Patient left: in chair;with call bell/phone within reach;with family/visitor present (back to main power source for LVAD)     Time: 8614-8307 PT Time Calculation (min) (ACUTE ONLY): 29 min  Charges:  $Gait Training: 8-22 mins $Therapeutic Activity: 8-22 mins                     G Codes:      Ellouise Newer September 03, 2015, 12:44 PM Camille Bal [place], [place]

## 2015-08-18 NOTE — Care Management (Signed)
Per NP-Amy Clegg note- Pt will need home oxygen. O2 sats 84% on room air at rest.  O2 sats improved to 91% on 3 liters.

## 2015-08-18 NOTE — Progress Notes (Signed)
Pt/family given discharge instructions, medication lists, follow up appointments, and when to call the doctor.  Pt/family verbalizes understanding. Felipe Cabell McClintock, RN   

## 2015-08-18 NOTE — Progress Notes (Signed)
Patient ID: Brianna Hanson, female   DOB: [birth year], 80 y.o.   MRN: [id number]           Subjective: Pt awaiting d/c home today; doing ok; has some mild soreness at GB drain; denies N/V or fever   Allergies: Review of patient's allergies indicates no known allergies.  Medications: Prior to Admission medications   Medication Sig Start Date End Date Taking? Authorizing Provider  amiodarone (PACERONE) 200 MG tablet TAKE 1 TABLET BY MOUTH EVERY DAY 07/26/15  Yes Thompson Grayer, MD  amLODipine (NORVASC) 10 MG tablet TAKE 1 TABLET BY MOUTH TWICE A DAY 03/28/15  Yes Shaune Pascal [name], MD  carvedilol (COREG) 12.5 MG tablet TAKE 1 & 1/2 TABS TWICE DAILY 05/15/15  Yes Jolaine Artist, MD  Cholecalciferol (VITAMIN D3) 1000 UNITS CAPS Take 1 tablet by mouth daily.     Yes Historical Provider, MD  docusate sodium (COLACE) 100 MG capsule Take 1 capsule (100 mg total) by mouth 2 (two) times daily. 07/11/15  Yes Jolaine Artist, MD  ferrous sulfate 325 (65 FE) MG tablet Take 1 tablet (325 mg total) by mouth 2 (two) times daily with a meal. 07/11/15  Yes Jolaine Artist, MD  gabapentin (NEURONTIN) 100 MG capsule TAKE 1 CAPSULE BY MOUTH AT BEDTIME OR AS DIRECTED 02/07/15  Yes Amy D Clegg, NP  losartan (COZAAR) 50 MG tablet TAKE 1 TABLET BY MOUTH EVERY DAY 05/15/15  Yes Amy D Clegg, NP  pantoprazole (PROTONIX) 40 MG tablet TAKE 1 TABLET BY MOUTH EVERY DAY 07/25/15  Yes Jolaine Artist, MD  spironolactone (ALDACTONE) 25 MG tablet TAKE 1 TABLET BY MOUTH EVERY DAY 07/25/15  Yes Jolaine Artist, MD  torsemide (DEMADEX) 20 MG tablet Take 1 tablet (20 mg total) by mouth daily as needed. weight gain 3 lbs in 24 hours 05/11/15  Yes Jolaine Artist, MD  traMADol (ULTRAM) 50 MG tablet Take 1 tablet (50 mg total) by mouth every 6 (six) hours as needed for moderate pain. for pain 05/11/15  Yes Jolaine Artist, MD  warfarin (COUMADIN) 2.5 MG tablet Take 2.5-5 mg by mouth daily. 2.5mg  daily except 5mg  on  Wednesdays   Yes Historical Provider, MD  ciprofloxacin (CIPRO) 500 MG tablet Take 1 tablet (500 mg total) by mouth 2 (two) times daily. 08/18/15   Amy D Ninfa Meeker, NP  hydrALAZINE (APRESOLINE) 100 MG tablet Take 0.5 tablets (50 mg total) by mouth 3 (three) times daily. 08/18/15   Amy D Ninfa Meeker, NP  oxyCODONE-acetaminophen (PERCOCET/ROXICET) 5-325 MG tablet Take 1 tablet by mouth every 6 (six) hours as needed for moderate pain or severe pain. 08/18/15   Amy D Ninfa Meeker, NP     Vital Signs: BP 95/0 mmHg  Pulse 90  Temp(Src) 98.4 F (36.9 C) (Oral)  Resp 18  Ht 5' (1.524 m)  Wt 173 lb 3.2 oz (78.563 kg)  BMI 33.83 kg/m2  SpO2 96%  Physical Exam GB drain intact, insertion site ok, mildly tender, output 250 + cc bile; cx's - few klebsiella  Imaging: No results found.  Labs:  CBC:  Recent Labs  08/15/15 0906 08/16/15 0520 08/17/15 0540 08/18/15 0430  WBC 15.1* 17.9* 16.7* 14.2*  HGB 8.6* 8.6* 8.5* 8.3*  HCT 28.5* 28.1* 29.0* 26.7*  PLT 243 263 301 313    COAGS:  Recent Labs  08/15/15 0415 08/16/15 0520 08/17/15 0540 08/18/15 0430  INR 4.66* 3.87* 2.92* 2.62*    BMP:  Recent Labs  08/14/15 0344  08/15/15 0415 08/16/15 0520 08/18/15 0430  NA 137 140 138 136  K 3.3* 3.5 3.9 3.8  CL 102 106 103 103  CO2 28 27 28 26   GLUCOSE 107* 78 95 84  BUN 8 6 5* 7  CALCIUM 8.2* 8.2* 8.4* 8.1*  CREATININE 1.57* 1.41* 1.36* 1.46*  GFRNONAA 30* 35* 36* 33*  GFRAA 35* 40* 42* 39*    LIVER FUNCTION TESTS:  Recent Labs  08/11/15 0400 08/13/15 0315 08/14/15 0344 08/16/15 0520  BILITOT 1.1 0.8 0.9 0.5  AST 30 20 22 24   ALT 50 28 25 20   ALKPHOS 122 89 94 103  PROT 6.4* 6.8 6.5 7.3  ALBUMIN 2.2* 2.0* 2.0* 1.9*    Assessment and Plan:  S/p perc cholecystostomy 12/8 for cholecystitis; tube draining well, pt afebrile; awaiting d/c home today; will set pt up for f/u cholangiogram at IR clinic after holidays- pt aware.  Signed: D. Rowe Robert 08/18/2015, 2:35 PM   I  spent a total of 15 minutes at the the patient's bedside AND on the patient's hospital floor or unit, greater than 50% of which was counseling/coordinating care for perc cholecystostomy

## 2015-08-18 NOTE — Progress Notes (Signed)
Patient ID: Brianna Hanson, female   DOB: [birth year], 80 y.o.   MRN: [id number]    HeartMate 2 Rounding Note  Subjective:     Wants to go home. Mild abdominal pain moving around but controlled with pain medications. Weight stable at 173. C-tube still draining 200-250/day  LVAD INTERROGATION:  HeartMate II LVAD:  Flow 5.1 liters/min, speed 9200 , power 6  PI 6.4.  No PI events.   Objective:    Vital Signs:   Temp:  [98.1 F (36.7 C)-98.5 F (36.9 C)] 98.1 F (36.7 C) (12/16 0504) Pulse Rate:  [82-97] 82 (12/16 0504) Resp:  [18] 18 (12/16 0504) BP: (90-95)/(0) 95/0 mmHg (12/15 1650) SpO2:  [94 %-96 %] 96 % (12/16 0504) Weight:  [173 lb 3.2 oz (78.563 kg)] 173 lb 3.2 oz (78.563 kg) (12/16 0504) Last BM Date: 08/17/15 Mean arterial Pressure 80   Intake/Output:   Intake/Output Summary (Last 24 hours) at 08/18/15 1013 Last data filed at 08/18/15 0800  Gross per 24 hour  Intake    240 ml  Output    650 ml  Net   -410 ml     Physical Exam: General:  In the chair.  NAD. No resp difficulty HEENT: normal Neck: supple. JVP 6-7. Carotids 2+ bilat; no bruits. No lymphadenopathy or thryomegaly appreciated. Cor: Mechanical heart sounds with LVAD hum present.  Lungs: clear Abdomen: soft, non tender. R quadrant perc tube.  LVAD driveline dressing. No erythema or drainage nondistended. No hepatosplenomegaly. No bruits or masses.  Driveline: C/D/I; securement device intact and driveline incorporated Extremities: no cyanosis, clubbing, rash, edema. RUE PICC.  Neuro: alert & orientedx3, cranial nerves grossly intact. moves all 4 extremities w/o difficulty. Affect pleasant  Telemetry: NSR 60s  Labs: Basic Metabolic Panel:  Recent Labs Lab 08/13/15 0315 08/14/15 0344 08/15/15 0415 08/16/15 0520 08/18/15 0430  NA 139 137 140 138 136  K 3.4* 3.3* 3.5 3.9 3.8  CL 103 102 106 103 103  CO2 29 28 27 28 26   GLUCOSE 115* 107* 78 95 84  BUN 9 8 6  5* 7  CREATININE 1.60* 1.57* 1.41*  1.36* 1.46*  CALCIUM 8.4* 8.2* 8.2* 8.4* 8.1*    Liver Function Tests:  Recent Labs Lab 08/13/15 0315 08/14/15 0344 08/16/15 0520  AST 20 22 24   ALT 28 25 20   ALKPHOS 89 94 103  BILITOT 0.8 0.9 0.5  PROT 6.8 6.5 7.3  ALBUMIN 2.0* 2.0* 1.9*   No results for input(s): LIPASE, AMYLASE in the last 168 hours. No results for input(s): AMMONIA in the last 168 hours.  CBC:  Recent Labs Lab 08/14/15 0344 08/15/15 0906 08/16/15 0520 08/17/15 0540 08/18/15 0430  WBC 13.9* 15.1* 17.9* 16.7* 14.2*  HGB 8.6* 8.6* 8.6* 8.5* 8.3*  HCT 28.6* 28.5* 28.1* 29.0* 26.7*  MCV 79.0 79.8 80.3 80.3 80.2  PLT 234 243 263 301 313    INR:  Recent Labs Lab 08/14/15 0344 08/15/15 0415 08/16/15 0520 08/17/15 0540 08/18/15 0430  INR 4.84* 4.66* 3.87* 2.92* 2.62*    Other results:    Imaging: No results found.   Medications:     Scheduled Medications: . amiodarone  200 mg Oral Daily  . antiseptic oral rinse  7 mL Mouth Rinse BID  . cefTRIAXone (ROCEPHIN)  IV  2 g Intravenous Q24H  . docusate sodium  100 mg Oral BID  . ferrous sulfate  325 mg Oral BID WC  . gabapentin  100 mg Oral QHS  . hydrALAZINE  50 mg Oral 3 times per day  . pantoprazole  40 mg Oral Daily  . sodium chloride  10-40 mL Intracatheter Q12H  . spironolactone  25 mg Oral Daily  . Warfarin - Pharmacist Dosing Inpatient   Does not apply q1800    Infusions: . sodium chloride 10 mL/hr at 08/15/15 1500    PRN Medications: acetaminophen **OR** acetaminophen, ondansetron, oxyCODONE-acetaminophen, sodium chloride, sodium chloride, traMADol   Assessment:   1. Abdominal pain and nausea: acute on chronic cholecystitis   --s/p percutaneous GB drain 12/8  2. Chronic systolic HF s/p HM II VAD 3. Acute on chronic renal failure, stage III-IV 4. HTN 5. PAF in NSR on amiodarone 6. Chronic anemia, iron deficient 7. Hypokalemia 8. Fever/nausea/vomiting 9. Anemia with acute blood loss - unclear source    --s/p  2units RBC on 12/8 10. Hypotension: Resolved.  11. Hypokalemia  Plan/Discussion:    Still draining from C-tube. Cultures c/w Klebsiella. Continue ceftriaxone. (Organism is sensitive to CTX.) WBC coming down slowly. Organism sensitive to cipro. Stop ceftriaxone and start cipro 500 mg twice a day until drain out.  Per Radiology plan to keep drain in for 6 weeks. I have left a message at the [facility] [phone] for follow up.   Volume status ok. Holding torsemide on spiro. MAP 80-96 will continue hydralazine 50 mg tid.  INR today 2.62   VAD parameters obtained personally.  VAD parameters stable.   She will need home oxygen. O2 sats 84% on room air. O2 sats improved to 91% on 3 liters. [facility] already set up for [place].   Darrick Grinder, NP-C  10:13 AM  Patient seen and examined with Darrick Grinder, NP. We discussed all aspects of the encounter. I agree with the assessment and plan as stated above.   C-tube still draining. Cultures with klebsiella. I spoke with Drs. Henn (IR) and [place] ([facility]). Will keep drain in and have her f/u in IR drain clinic in early January. She will then f/u with Dr. Donne Hazel for possible lap chole in January. Continue cipro per ID pharmacy.   HF and MAPs stable. VAD parameters look ok.   Will need home O2.  We will discharge home today with HHRN/PT. Will need close f/u in VAD clinic.   [name], Daniel,MD 2:14 PM

## 2015-08-18 NOTE — Progress Notes (Signed)
ANTICOAGULATION CONSULT NOTE - Follow Up Consult  Pharmacy Consult for warfarin Indication:LVAD  No Known Allergies  Patient Measurements: Height: 5' (152.4 cm) Weight: 173 lb 3.2 oz (78.563 kg) IBW/kg (Calculated) : 45.5  Vital Signs: Temp: 98.1 F (36.7 C) (12/16 0504) Temp Source: Oral (12/16 0504) Pulse Rate: 82 (12/16 0504)  Labs:  Recent Labs  08/16/15 0520 08/17/15 0540 08/18/15 0430  HGB 8.6* 8.5* 8.3*  HCT 28.1* 29.0* 26.7*  PLT 263 301 313  LABPROT 37.1* 30.0* 27.6*  INR 3.87* 2.92* 2.62*  CREATININE 1.36*  --  1.46*    Estimated Creatinine Clearance: 29.4 mL/min (by C-G formula based on Cr of 1.46).  Assessment: 80 yo f with LVAD placed in 2014. Pt on warfarin PTA.   INR is trending down this AM to 2.62. Hgb stable, plts ok. No bleeding noted.   Restart home dose warfarin tonight. -as to prevent fall too much may switch to po cipro so will monitor for INR jump.  PTA warfarin dose 2.5mg  daily except 5mg  on Wednesdays   Goal of Therapy:  INR 2-2.5 Monitor platelets by anticoagulation protocol: Yes   Plan:  Warfarin 2.5 mg tonight F/u INR in AM Monitor s/sx of bleeding  Bonnita Nasuti Pharm.D. CPP, BCPS Clinical Pharmacist [phone] 08/18/2015 10:31 AM

## 2015-08-18 NOTE — Discharge Instructions (Signed)

## 2015-08-19 DIAGNOSIS — I2510 Atherosclerotic heart disease of native coronary artery without angina pectoris: Secondary | ICD-10-CM | POA: Diagnosis not present

## 2015-08-19 DIAGNOSIS — K812 Acute cholecystitis with chronic cholecystitis: Secondary | ICD-10-CM | POA: Diagnosis not present

## 2015-08-19 DIAGNOSIS — E8809 Other disorders of plasma-protein metabolism, not elsewhere classified: Secondary | ICD-10-CM | POA: Diagnosis not present

## 2015-08-19 DIAGNOSIS — Z9981 Dependence on supplemental oxygen: Secondary | ICD-10-CM | POA: Diagnosis not present

## 2015-08-19 DIAGNOSIS — Z7901 Long term (current) use of anticoagulants: Secondary | ICD-10-CM | POA: Diagnosis not present

## 2015-08-19 DIAGNOSIS — Z4682 Encounter for fitting and adjustment of non-vascular catheter: Secondary | ICD-10-CM | POA: Diagnosis not present

## 2015-08-19 DIAGNOSIS — Z95810 Presence of automatic (implantable) cardiac defibrillator: Secondary | ICD-10-CM | POA: Diagnosis not present

## 2015-08-19 DIAGNOSIS — D509 Iron deficiency anemia, unspecified: Secondary | ICD-10-CM | POA: Diagnosis not present

## 2015-08-19 DIAGNOSIS — I429 Cardiomyopathy, unspecified: Secondary | ICD-10-CM | POA: Diagnosis not present

## 2015-08-19 DIAGNOSIS — Z792 Long term (current) use of antibiotics: Secondary | ICD-10-CM | POA: Diagnosis not present

## 2015-08-19 DIAGNOSIS — M8580 Other specified disorders of bone density and structure, unspecified site: Secondary | ICD-10-CM | POA: Diagnosis not present

## 2015-08-19 DIAGNOSIS — I5022 Chronic systolic (congestive) heart failure: Secondary | ICD-10-CM | POA: Diagnosis not present

## 2015-08-19 DIAGNOSIS — N184 Chronic kidney disease, stage 4 (severe): Secondary | ICD-10-CM | POA: Diagnosis not present

## 2015-08-19 DIAGNOSIS — I130 Hypertensive heart and chronic kidney disease with heart failure and stage 1 through stage 4 chronic kidney disease, or unspecified chronic kidney disease: Secondary | ICD-10-CM | POA: Diagnosis not present

## 2015-08-19 DIAGNOSIS — I480 Paroxysmal atrial fibrillation: Secondary | ICD-10-CM | POA: Diagnosis not present

## 2015-08-19 DIAGNOSIS — I4892 Unspecified atrial flutter: Secondary | ICD-10-CM | POA: Diagnosis not present

## 2015-08-20 ENCOUNTER — Other Ambulatory Visit (HOSPITAL_COMMUNITY): Payer: Self-pay | Admitting: Adult Health

## 2015-08-21 ENCOUNTER — Other Ambulatory Visit: Payer: Self-pay | Admitting: General Surgery

## 2015-08-21 ENCOUNTER — Ambulatory Visit: Payer: Self-pay | Admitting: Infectious Diseases

## 2015-08-21 ENCOUNTER — Other Ambulatory Visit (HOSPITAL_COMMUNITY): Payer: Medicare Other

## 2015-08-21 ENCOUNTER — Other Ambulatory Visit (HOSPITAL_COMMUNITY): Payer: Self-pay | Admitting: Infectious Diseases

## 2015-08-21 ENCOUNTER — Encounter (HOSPITAL_COMMUNITY): Payer: Self-pay

## 2015-08-21 ENCOUNTER — Ambulatory Visit (HOSPITAL_COMMUNITY)
Admit: 2015-08-21 | Discharge: 2015-08-21 | Disposition: A | Discharge status: Home / Self Care | Payer: Medicare Other | Source: Ambulatory Visit | Attending: Cardiology | Admitting: Cardiology

## 2015-08-21 VITALS — BP 84/0 | HR 100 | Wt 176.6 lb

## 2015-08-21 DIAGNOSIS — Z7901 Long term (current) use of anticoagulants: Secondary | ICD-10-CM

## 2015-08-21 DIAGNOSIS — Z5181 Encounter for therapeutic drug level monitoring: Secondary | ICD-10-CM

## 2015-08-21 DIAGNOSIS — Z95811 Presence of heart assist device: Secondary | ICD-10-CM

## 2015-08-21 DIAGNOSIS — K810 Acute cholecystitis: Secondary | ICD-10-CM

## 2015-08-21 DIAGNOSIS — K811 Chronic cholecystitis: Secondary | ICD-10-CM | POA: Diagnosis not present

## 2015-08-21 DIAGNOSIS — I5022 Chronic systolic (congestive) heart failure: Secondary | ICD-10-CM | POA: Diagnosis not present

## 2015-08-21 DIAGNOSIS — I110 Hypertensive heart disease with heart failure: Secondary | ICD-10-CM | POA: Insufficient documentation

## 2015-08-21 DIAGNOSIS — I10 Essential (primary) hypertension: Secondary | ICD-10-CM

## 2015-08-21 DIAGNOSIS — I5041 Acute combined systolic (congestive) and diastolic (congestive) heart failure: Secondary | ICD-10-CM

## 2015-08-21 DIAGNOSIS — I480 Paroxysmal atrial fibrillation: Secondary | ICD-10-CM | POA: Diagnosis not present

## 2015-08-21 LAB — CBC
HCT: 29.4 % — ABNORMAL LOW (ref 36.0–46.0)
Hemoglobin: 8.8 g/dL — ABNORMAL LOW (ref 12.0–15.0)
MCH: 23.8 pg — ABNORMAL LOW (ref 26.0–34.0)
MCHC: 29.9 g/dL — ABNORMAL LOW (ref 30.0–36.0)
MCV: 79.7 fL (ref 78.0–100.0)
Platelets: 452 10*3/uL — ABNORMAL HIGH (ref 150–400)
RBC: 3.69 MIL/uL — ABNORMAL LOW (ref 3.87–5.11)
RDW: 25.5 % — ABNORMAL HIGH (ref 11.5–15.5)
WBC: 10.6 10*3/uL — ABNORMAL HIGH (ref 4.0–10.5)

## 2015-08-21 LAB — COMPREHENSIVE METABOLIC PANEL
ALT: 17 U/L (ref 14–54)
AST: 23 U/L (ref 15–41)
Albumin: 2.6 g/dL — ABNORMAL LOW (ref 3.5–5.0)
Alkaline Phosphatase: 111 U/L (ref 38–126)
Anion gap: 9 (ref 5–15)
BUN: 7 mg/dL (ref 6–20)
CO2: 22 mmol/L (ref 22–32)
Calcium: 9.1 mg/dL (ref 8.9–10.3)
Chloride: 105 mmol/L (ref 101–111)
Creatinine, Ser: 1.63 mg/dL — ABNORMAL HIGH (ref 0.44–1.00)
GFR calc Af Amer: 34 mL/min — ABNORMAL LOW (ref >60)
GFR calc non Af Amer: 29 mL/min — ABNORMAL LOW (ref >60)
Glucose, Bld: 95 mg/dL (ref 65–99)
Potassium: 4.1 mmol/L (ref 3.5–5.1)
Sodium: 136 mmol/L (ref 135–145)
Total Bilirubin: 0.4 mg/dL (ref 0.3–1.2)
Total Protein: 8.8 g/dL — ABNORMAL HIGH (ref 6.5–8.1)

## 2015-08-21 LAB — PROTIME-INR
INR: 2.41 — ABNORMAL HIGH (ref 0.00–1.49)
Prothrombin Time: 26 seconds — ABNORMAL HIGH (ref 11.6–15.2)

## 2015-08-21 LAB — LACTATE DEHYDROGENASE: LDH: 281 U/L — ABNORMAL HIGH (ref 98–192)

## 2015-08-21 NOTE — Patient Instructions (Addendum)
1. Change dressing on drain every 2 - 3 days. WIll provide you with daily kits today and we can use those until you go to your appointment with the [facility] clinic and Dr. Donne Hazel.   2. We will call you with your labs today.   3. Get some saline spray in your nose and a few times a day to help with bloody nose from the oxygen making it dry. Could also use a little Vaseline on a Q-tip to help.   4. Continue to use your oxygen at home out and about. Resting oxygen level 94% where you activity oxygen level was 88% (goal at least 90%).   5. Keep current appointment with [place] on January 11th at 10:00. We will call you with your labs today.

## 2015-08-21 NOTE — Progress Notes (Signed)
Symptom Yes No Details  Angina  x Activity:  Claudication  x How far:  Syncope  x When:  Stroke  x   Orthopnea x  How many pillows:  PND  x How often:  CPAP  x How many hrs:  Pedal edema x  Occasionally at night. Clears by the morning.   Abd fullness  x   N&V  x   Diaphoresis  x When:  Bleeding  x Stopped bleeding from rectum (hemorrhoids) and nose  Urine  x Clear yellow   SOB x  Activity: some shortness of breath walking around.   Palpitations  x When:  ICD shock  x   Hospitlizaitons x  When/where/why: [facility] for acute on chronic GB cholecystitis. 08/06/15 - 08/18/15  ED visit x  When/where/why:  Other MD  x When/who/why:  Activity  x   Fluid  x   Diet  x    Vital Signs: Temperature: afebrile Doppler BP: 84 Automatic BP:  HR: 100 SPO2: 88 - 94 % depending on activity.   Weight: 176.6 lbs Last weight:   VAD interrogation & Equipment Management: Speed: 9200 Flow:  4.1 Power: 5.0 w PI: 7.0  Alarms:  None  Events:  None   Fixed speed: 9200 Low speed limit: 8600  Primary Controller:  Replace back up battery in  months. Back up controller:   Replace back up battery in  months.  Annual Equipment Maintenance on UBC/PM was performed on September 2016.   I reviewed the LVAD parameters from today and compared the results to the patient's prior recorded data.  LVAD interrogation was NEGATIVE for significant power changes, NEGATIVE for clinical alarms and NEGATIVE for PI events/speed drops. No programming changes were made and pump is functioning within specified parameters.   LVAD equipment check completed and is in good working order. Back-up equipment present. LVAD education done on emergency procedures and precautions and reviewed exit site care.   Exit Site Care: Drive line is being maintained weekly by her husband [name]. Dressing site is C/D/I with a smaller sorbaview dressing to accommodate the location of the chole-drain currently.   Drive line exit site well  healed and incorporated. The velour is fully implanted at exit site. Dressing dry and intact. No erythema or drainage. Stabilization device present and accurately applied. Driveline dressing is being changed weekly per sterile technique using Sorbaview dressing with biopatch on exit site. Pt denies fever or chills. Provided with daily dressing kits for IR drain management.   Encounter Details: Patient presents for hospital D/C follow up in [facility] today with her husband [name]. Reports she is much improved with pain however feels very weak and nervous. HHRN came Saturday for initial visit. Planning on nurse and therapy to each come once a week. Keeping foods down well. Unsure of what to do with dressing care for drain--will provide with daily dressing kits today and advised to change 2 - 3 times a week.   On Ciprofloxacin BID until decision is made regarding the drain vs surgery. Due 09/05/15 for first FU with IR drain clinic and with Dr. Donne Hazel on 09/13/15. Has had out 260 cc total since discharge (Friday evening). Changed GB drain dressing today in clinic and taught [name] how to perform.   Was D/C'd on home oxygen this hospital admission. Oxygen level with activity was 88% and 94% at rest.   INR 2.41today. Anticoagulation management includes INR goal of 2.0 - 2.5 with no ASA. See anticoagulation flow sheet.  LDH stable at 281 with established baseline. Denies tea-colored urine.   MAPs 84 today on current regimen. Coreg still on hold and lasix only PRN. Has not needed any since discharge.   Medication Changes at this encounter: no changes (possibly consider a dose of Feraheme outpatient next clinic visit).   RTC with currently scheduled appointment on 09/12/14   Janene Madeira, RN [place] Coordinator   Office: [phone] 24/7 [place] Pager: [phone]

## 2015-08-22 DIAGNOSIS — Z4682 Encounter for fitting and adjustment of non-vascular catheter: Secondary | ICD-10-CM | POA: Diagnosis not present

## 2015-08-22 DIAGNOSIS — K812 Acute cholecystitis with chronic cholecystitis: Secondary | ICD-10-CM | POA: Diagnosis not present

## 2015-08-22 DIAGNOSIS — I130 Hypertensive heart and chronic kidney disease with heart failure and stage 1 through stage 4 chronic kidney disease, or unspecified chronic kidney disease: Secondary | ICD-10-CM | POA: Diagnosis not present

## 2015-08-22 DIAGNOSIS — D509 Iron deficiency anemia, unspecified: Secondary | ICD-10-CM | POA: Diagnosis not present

## 2015-08-22 DIAGNOSIS — I5022 Chronic systolic (congestive) heart failure: Secondary | ICD-10-CM | POA: Diagnosis not present

## 2015-08-22 DIAGNOSIS — N184 Chronic kidney disease, stage 4 (severe): Secondary | ICD-10-CM | POA: Diagnosis not present

## 2015-08-25 DIAGNOSIS — K812 Acute cholecystitis with chronic cholecystitis: Secondary | ICD-10-CM | POA: Diagnosis not present

## 2015-08-25 DIAGNOSIS — Z4682 Encounter for fitting and adjustment of non-vascular catheter: Secondary | ICD-10-CM | POA: Diagnosis not present

## 2015-08-25 DIAGNOSIS — N184 Chronic kidney disease, stage 4 (severe): Secondary | ICD-10-CM | POA: Diagnosis not present

## 2015-08-25 DIAGNOSIS — I5022 Chronic systolic (congestive) heart failure: Secondary | ICD-10-CM | POA: Diagnosis not present

## 2015-08-25 DIAGNOSIS — D509 Iron deficiency anemia, unspecified: Secondary | ICD-10-CM | POA: Diagnosis not present

## 2015-08-25 DIAGNOSIS — I130 Hypertensive heart and chronic kidney disease with heart failure and stage 1 through stage 4 chronic kidney disease, or unspecified chronic kidney disease: Secondary | ICD-10-CM | POA: Diagnosis not present

## 2015-08-25 NOTE — Progress Notes (Signed)
Patient ID: Brianna Hanson, female   DOB: 10-02-1934, 80 y.o.   MRN: [id number]   ADVANCED HF CLINIC NOTE  Patient ID: Brianna Hanson, female   DOB: June 06, 1935, 80 y.o.   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 [name] (follwoed q 6 months). Underwent implantation of the HeartMate II LVAD on 04/06/13 for DT. She is not on aspirin due to dizziness.   Admitted to [facility] from 12/4 to 08/18/15 for acute cholecystitis. Underwent placement of a cholecystotomy drain by IR. Feeling much better. Continues to drain about 70-80cc/day. No further ab pain. Getting stronger. Able to do ADLs  Denies SOB/PND/Orthopnea. No edema. Weight stable.  No bleeding problems. Significant other performs weekly dressing changes.   Denies LVAD alarms.  Denies driveline trauma, erythema or drainage.  Denies ICD shocks.   Reports taking Coumadin as prescribed.  Denies bright red blood per rectum or melena, no dark urine or hematuria.    VAD interrogation & Equipment Management:  Speed: 9200  Flow: 4.1  Power: 5.0 w  PI: 7.0  Alarms: None  Events: None   Fixed speed: 9200  Low speed limit: 8600   Echo (7/15) with LV EF 30%.    Past Medical History  Diagnosis Date  . Cardiac arrest - ventricular fibrillation 12/10    with successful resucitation, S/p ICD  . ICD (implantable cardiac defibrillator) in place     she has received appropriate therapy for VF  . Atrial fibrillation or flutter   . Osteopenia   . HTN (hypertension)     moderate  . Nonischemic cardiomyopathy ([place])     followed by Dr April Holding at [facility]  . CHF (congestive heart failure) ([place])   . Anemia   . Plasma cell disorder 03/20/2012  . Plasma cell disorder 03/20/2012  . Sessile colonic polyp 2011    Dr Benson [place]  . Diverticula of colon 2011  . Internal and  external hemorrhoids without complication [id number]    Current Outpatient Prescriptions  Medication Sig Dispense Refill  . amiodarone (PACERONE) 200 MG tablet TAKE 1 TABLET BY MOUTH EVERY DAY 30 tablet 11  . Cholecalciferol (VITAMIN D3) 1000 UNITS CAPS Take 1 tablet by mouth daily.      . ciprofloxacin (CIPRO) 500 MG tablet Take 1 tablet (500 mg total) by mouth 2 (two) times daily. 60 tablet 0  . docusate sodium (COLACE) 100 MG capsule Take 1 capsule (100 mg total) by mouth 2 (two) times daily. 60 capsule 6  . ferrous sulfate 325 (65 FE) MG tablet Take 1 tablet (325 mg total) by mouth 2 (two) times daily with a meal. 60 tablet 6  . gabapentin (NEURONTIN) 100 MG capsule TAKE 1 CAPSULE BY MOUTH AT BEDTIME OR AS DIRECTED 30 capsule 5  . hydrALAZINE (APRESOLINE) 100 MG tablet Take 0.5 tablets (50 mg total) by mouth 3 (three) times daily. 90 tablet 6  . oxyCODONE-acetaminophen (PERCOCET/ROXICET) 5-325 MG tablet Take 1 tablet by mouth every 6 (six) hours as needed for moderate pain or severe pain. 120 tablet 0  . pantoprazole (PROTONIX) 40 MG tablet TAKE 1 TABLET BY MOUTH EVERY DAY 30 tablet 6  . spironolactone (ALDACTONE) 25 MG tablet TAKE 1 TABLET BY MOUTH EVERY DAY 30 tablet 6  . torsemide (DEMADEX) 20  MG tablet Take 1 tablet (20 mg total) by mouth daily as needed. weight gain 3 lbs in 24 hours 15 tablet 6  . traMADol (ULTRAM) 50 MG tablet Take 1 tablet (50 mg total) by mouth every 6 (six) hours as needed for moderate pain. for pain 30 tablet 3  . warfarin (COUMADIN) 2.5 MG tablet Take 2.5-5 mg by mouth daily. 2.5mg  daily except 5mg  on Wednesdays     No current facility-administered medications for this encounter.    Review of patient's allergies indicates no known allergies.  REVIEW OF SYSTEMS: All systems negative except as listed in HPI, PMH and Problem list.  Filed Vitals:   08/21/15 1419 08/21/15 1434  BP: 84/0   Pulse: 100   Weight: 176 lb 9.6 oz (80.105 kg)   SpO2: 88% 94%   Vital  Signs:  Temperature: afebrile  Doppler BP: 84  Automatic BP:  HR: 100  SPO2: 88 - 94 % depending on activity.  Weight: 176.6 lbs    Physical Exam: GENERAL: Well appearing, female, NAD, Brianna Hanson present HEENT: normal  NECK: Supple, JVP 6,  2+ bilaterally, no bruits.  No lymphadenopathy or thyromegaly appreciated.   CARDIAC:  LVAD hum present.  LUNGS:  Clear to auscultation bilaterally.  ABDOMEN:  Soft, round, nontender, positive bowel sounds x4.  Drain in RUQ drainin gbilious fluid  LVAD exit site: Dressing dry and intact.  No erythema or drainage.  Stabilization device present and accurately applied. Driveline dressing changed weekly. EXTREMITIES:  Warm and dry, no cyanosis, clubbing, rash, edema  NEUROLOGIC:  Alert and oriented x 4.  Gait steady.  No aphasia.  No dysarthria.  Affect pleasant.     ASSESSMENT AND PLAN:  1) Chronic systolic HF: NICM, s/p ICD and LVAD for DT(04/2013). Getting stronger post-hospital. Able to do ADLs.Volume status ok. - Meds cut back in hospital due to low BP - Continue 25 mg spironolactone daily. - Off losartan and carvedilol for now. Can reintroduce as tolerated.  - Continue hydralazine 50 mg three times a day.  - Continue weekly dressing changes.  - Reinforced the need and importance of daily weights, a low sodium diet, and fluid restriction (less than 2 L a day). Instructed to call the HF clinic if weight increases more than 3 lbs overnight or 5 lbs in a week.  2) Acute cholecystitis: - Improved with C-tube. Has f/u with [facility] Brianna Hanson) and IR drain Clinic in January.Will eventually need lap chole - Continue Cipro 3) LVAD placed for DT 04/2013: All parameters stable. (reviewed personally) - Check LDH, INR, CBC and BMET today.  - Remains on coumadin.   Not on aspirin due to intolerance. - VAD parameters look good 4) PAF: Stable. Continue amiodarone to 100 mg daily. Follow TSH and LFTsNeeds year eye exams.    5) HTN: Stable. Continue current regimen.      6) Chronic Anticoagulation: Goal INR 2-3 Continue coumadin. Check INR   Follow up in 3-4 weeks  Brianna Bickers  MD  08/25/2015

## 2015-08-28 DIAGNOSIS — I5022 Chronic systolic (congestive) heart failure: Secondary | ICD-10-CM | POA: Diagnosis not present

## 2015-08-28 DIAGNOSIS — D509 Iron deficiency anemia, unspecified: Secondary | ICD-10-CM | POA: Diagnosis not present

## 2015-08-28 DIAGNOSIS — Z4682 Encounter for fitting and adjustment of non-vascular catheter: Secondary | ICD-10-CM | POA: Diagnosis not present

## 2015-08-28 DIAGNOSIS — N184 Chronic kidney disease, stage 4 (severe): Secondary | ICD-10-CM | POA: Diagnosis not present

## 2015-08-28 DIAGNOSIS — I130 Hypertensive heart and chronic kidney disease with heart failure and stage 1 through stage 4 chronic kidney disease, or unspecified chronic kidney disease: Secondary | ICD-10-CM | POA: Diagnosis not present

## 2015-08-28 DIAGNOSIS — K812 Acute cholecystitis with chronic cholecystitis: Secondary | ICD-10-CM | POA: Diagnosis not present

## 2015-08-31 DIAGNOSIS — K812 Acute cholecystitis with chronic cholecystitis: Secondary | ICD-10-CM | POA: Diagnosis not present

## 2015-08-31 DIAGNOSIS — I130 Hypertensive heart and chronic kidney disease with heart failure and stage 1 through stage 4 chronic kidney disease, or unspecified chronic kidney disease: Secondary | ICD-10-CM | POA: Diagnosis not present

## 2015-08-31 DIAGNOSIS — N184 Chronic kidney disease, stage 4 (severe): Secondary | ICD-10-CM | POA: Diagnosis not present

## 2015-08-31 DIAGNOSIS — Z4682 Encounter for fitting and adjustment of non-vascular catheter: Secondary | ICD-10-CM | POA: Diagnosis not present

## 2015-08-31 DIAGNOSIS — I5022 Chronic systolic (congestive) heart failure: Secondary | ICD-10-CM | POA: Diagnosis not present

## 2015-08-31 DIAGNOSIS — D509 Iron deficiency anemia, unspecified: Secondary | ICD-10-CM | POA: Diagnosis not present

## 2015-09-05 ENCOUNTER — Ambulatory Visit
Admission: RE | Admit: 2015-09-05 | Discharge: 2015-09-05 | Disposition: A | Discharge status: Home / Self Care | Payer: Medicare Other | Source: Ambulatory Visit | Attending: Radiology | Admitting: Radiology

## 2015-09-05 ENCOUNTER — Ambulatory Visit
Admission: RE | Admit: 2015-09-05 | Discharge: 2015-09-05 | Disposition: A | Discharge status: Home / Self Care | Payer: Medicare Other | Source: Ambulatory Visit | Attending: General Surgery | Admitting: General Surgery

## 2015-09-05 ENCOUNTER — Telehealth (HOSPITAL_COMMUNITY): Payer: Self-pay | Admitting: *Deleted

## 2015-09-05 ENCOUNTER — Other Ambulatory Visit: Payer: Medicare Other

## 2015-09-05 ENCOUNTER — Inpatient Hospital Stay: Admission: RE | Admit: 2015-09-05 | Payer: Medicare Other | Source: Ambulatory Visit

## 2015-09-05 DIAGNOSIS — Z9049 Acquired absence of other specified parts of digestive tract: Secondary | ICD-10-CM | POA: Diagnosis not present

## 2015-09-05 DIAGNOSIS — K8020 Calculus of gallbladder without cholecystitis without obstruction: Secondary | ICD-10-CM | POA: Diagnosis not present

## 2015-09-05 DIAGNOSIS — K810 Acute cholecystitis: Secondary | ICD-10-CM

## 2015-09-05 DIAGNOSIS — Z4682 Encounter for fitting and adjustment of non-vascular catheter: Secondary | ICD-10-CM | POA: Diagnosis not present

## 2015-09-05 DIAGNOSIS — Z434 Encounter for attention to other artificial openings of digestive tract: Secondary | ICD-10-CM | POA: Insufficient documentation

## 2015-09-05 NOTE — Telephone Encounter (Signed)
Called pt re: missed INR appt today. Pt will go tomorrow for INR check.

## 2015-09-05 NOTE — Progress Notes (Signed)
Chief Complaint: Status post rotate his cholecystostomy tube placement on 08/10/2015 to treat cholecystitis.  History of Present Illness: Brianna Hanson is a 81 y.o. female status post percutaneous cholecystostomy on 08/10/2015 to treat cholecystitis. Imaging also demonstrated evidence of small gallstones. The patient has done quite well since leaving the hospital with resolution of abdominal pain. She has been flushing the cholecystostomy tube once daily and there is return of clear bile from the catheter. She denies fever. She has a follow-up surgical appointment with Dr. Hulen Skains soon.   Past Medical History  Diagnosis Date  . Cardiac arrest - ventricular fibrillation 12/10    with successful resucitation, S/p ICD  . ICD (implantable cardiac defibrillator) in place     she has received appropriate therapy for VF  . Atrial fibrillation or flutter   . Osteopenia   . HTN (hypertension)     moderate  . Nonischemic cardiomyopathy ([place])     followed by Dr April Holding at [facility]  . CHF (congestive heart failure) ([place])   . Anemia   . Plasma cell disorder 03/20/2012  . Plasma cell disorder 03/20/2012  . Sessile colonic polyp 2011    Dr Benson Norway  . Diverticula of colon 2011  . Internal and external hemorrhoids without complication [id number]    Past Surgical History  Procedure Laterality Date  . Abdominal hysterectomy    . Cardiac catheterization    . Cardiac defibrillator placement      by JA for secondary prevention of sudden death  . Insertion of implantable left ventricular assist device N/A 04/06/2013    Procedure: INSERTION OF IMPLANTABLE LEFT VENTRICULAR ASSIST DEVICE;  Surgeon: Gaye Pollack, MD;  Location: [place];  Service: Open Heart Surgery;  Laterality: N/A;  . Placement of centrimag ventricular assist device Right 04/06/2013    Procedure: PLACEMENT OF CENTRIMAG VENTRICULAR ASSIST DEVICE;  Surgeon: Gaye Pollack, MD;  Location: [place];  Service: Open Heart Surgery;  Laterality: Right;  .  Intraoperative transesophageal echocardiogram N/A 04/06/2013    Procedure: INTRAOPERATIVE TRANSESOPHAGEAL ECHOCARDIOGRAM;  Surgeon: Gaye Pollack, MD;  Location: [facility] OR;  Service: Open Heart Surgery;  Laterality: N/A;  . Colonoscopy w/ polypectomy  2011    Dr Benson Norway    Allergies: Review of patient's allergies indicates no known allergies.  Medications: Prior to Admission medications   Medication Sig Start Date End Date Taking? Authorizing Provider  amiodarone (PACERONE) 200 MG tablet TAKE 1 TABLET BY MOUTH EVERY DAY 07/26/15   Thompson Grayer, MD  Cholecalciferol (VITAMIN D3) 1000 UNITS CAPS Take 1 tablet by mouth daily.      Historical Provider, MD  ciprofloxacin (CIPRO) 500 MG tablet Take 1 tablet (500 mg total) by mouth 2 (two) times daily. 08/18/15   Amy D Ninfa Meeker, NP  docusate sodium (COLACE) 100 MG capsule Take 1 capsule (100 mg total) by mouth 2 (two) times daily. 07/11/15   Jolaine Artist, MD  ferrous sulfate 325 (65 FE) MG tablet Take 1 tablet (325 mg total) by mouth 2 (two) times daily with a meal. 07/11/15   Jolaine Artist, MD  gabapentin (NEURONTIN) 100 MG capsule TAKE 1 CAPSULE BY MOUTH AT BEDTIME OR AS DIRECTED 08/21/15   Amy D Clegg, NP  hydrALAZINE (APRESOLINE) 100 MG tablet Take 0.5 tablets (50 mg total) by mouth 3 (three) times daily. 08/18/15   Amy D Ninfa Meeker, NP  oxyCODONE-acetaminophen (PERCOCET/ROXICET) 5-325 MG tablet Take 1 tablet by mouth every 6 (six) hours as needed for moderate  pain or severe pain. 08/18/15   Amy D Ninfa Meeker, NP  pantoprazole (PROTONIX) 40 MG tablet TAKE 1 TABLET BY MOUTH EVERY DAY 07/25/15   Jolaine Artist, MD  spironolactone (ALDACTONE) 25 MG tablet TAKE 1 TABLET BY MOUTH EVERY DAY 07/25/15   Jolaine Artist, MD  torsemide (DEMADEX) 20 MG tablet Take 1 tablet (20 mg total) by mouth daily as needed. weight gain 3 lbs in 24 hours 05/11/15   Jolaine Artist, MD  traMADol (ULTRAM) 50 MG tablet Take 1 tablet (50 mg total) by mouth every 6 (six) hours as  needed for moderate pain. for pain 05/11/15   Jolaine Artist, MD  warfarin (COUMADIN) 2.5 MG tablet Take 2.5-5 mg by mouth daily. 2.5mg  daily except 5mg  on Wednesdays    Historical Provider, MD     Family History  Problem Relation Age of Onset  . Stroke Brother   . Stroke Mother     Social History   Social History  . Marital Status: Single    Spouse Name: N/A  . Number of Children: 3  . Years of Education: N/A   Social History Main Topics  . Smoking status: Never Smoker   . Smokeless tobacco: Never Used     Comment: remote history of tobacco abuse  . Alcohol Use: No     Comment: occsionally wine  . Drug Use: No  . Sexual Activity: Not Currently    Birth Control/ Protection: Surgical   Other Topics Concern  . Not on file   Social History Narrative   Lives with spouse who was recently diagnosed with esophageal ca and is receiving chemotherapy    Review of Systems: A 12 point ROS discussed and pertinent positives are indicated in the HPI above.  All other systems are negative.  Review of Systems  Constitutional: Negative.   Respiratory: Negative for cough and shortness of breath.   Cardiovascular: Negative for chest pain.  Gastrointestinal: Negative.   Genitourinary: Negative.   Neurological: Negative.     Vital Signs: BP 108/81 mmHg  Pulse 86  Temp(Src) 97.9 F (36.6 C) (Oral)  SpO2 84%  Physical Exam  Abdominal: Soft. She exhibits no distension. There is no tenderness. There is no rebound and no guarding.  Nursing note and vitals reviewed.    Imaging: Ct Abdomen Pelvis Wo Contrast  09/05/2015  CLINICAL DATA:  Status post percutaneous cholecystostomy tube placement on 08/10/2015. EXAM: CT ABDOMEN AND PELVIS WITHOUT CONTRAST TECHNIQUE: Multidetector CT imaging of the abdomen and pelvis was performed following the standard protocol without IV contrast. COMPARISON:  08/10/2015 CT scan and additional imaging at the time of cholecystostomy tube placement.  FINDINGS: The cholecystostomy tube remains stable in position within the fundus of the gallbladder. There remains some calcified dependent gallstones. The gallbladder is nondistended and inflammation surrounding the gallbladder has resolved by CT. Bile ducts are normal in caliber without evidence of dilatation. Inferior aspect of an LVAD is again visualized. Other solid organs of the abdomen and pelvis appear unremarkable. Bowel loops are normal. No abnormal fluid collections. No hernias, masses or enlarged lymph nodes are seen. Bony structures are unremarkable. IMPRESSION: Decompressed gallbladder containing cholecystostomy tube. Some calcified gallstones are visible. Inflammation surrounding the gallbladder has resolved. There is no evidence of biliary ductal dilatation. Electronically Signed   By: Aletta Edouard M.D.   On: 09/05/2015 10:48   Ct Abdomen Pelvis Wo Contrast  08/10/2015  CLINICAL DATA:  Worsening right-sided abdominal pain for several days. Percutaneous cholecystostomy.  Chronic systolic heart failure with left ventricular assist device. EXAM: CT ABDOMEN AND PELVIS WITHOUT CONTRAST TECHNIQUE: Multidetector CT imaging of the abdomen and pelvis was performed following the standard protocol without IV contrast. COMPARISON:  08/06/2015 FINDINGS: Lower chest: Cardiomegaly again demonstrated with left ventricular assist device in place. Increased bilateral lower lobe consolidation with air bronchograms noted. New tiny bilateral pleural effusions also noted. Hepatobiliary: No mass visualized on this un-enhanced exam. There has been placement of percutaneous cholecystostomy tube since previous study. Increased diffuse gallbladder wall thickening is seen as well as right upper quadrant inflammatory changes, consistent with acute cholecystitis. Pancreas: No mass or inflammatory process identified on this un-enhanced exam. Spleen: Within normal limits in size. Adrenals/Urinary Tract: No evidence of  urolithiasis or hydronephrosis. Small left upper pole renal cyst again noted. Foley catheter seen within the bladder which is collapsed. Stomach/Bowel: No evidence of obstruction. Diffuse colonic diverticulosis is demonstrated, without evidence of diverticulitis. Vascular/Lymphatic: No pathologically enlarged lymph nodes. No evidence of abdominal aortic aneurysm. Reproductive: Prior hysterectomy noted. Adnexal regions are unremarkable in appearance. Other: Increased diffuse body wall edema and mesenteric edema noted as well as mild ascites which is new since previous study. Musculoskeletal:  No suspicious bone lesions identified. IMPRESSION: Percutaneous cholecystostomy tube in appropriate position. Increased gallbladder wall thickening and pericholecystic inflammatory changes, consistent with acute cholecystitis. New mild ascites and diffuse mesenteric and body wall edema. Increased bilateral lower lobe consolidation and tiny bilateral pleural effusions. Colonic diverticulosis. No radiographic evidence of diverticulitis. Electronically Signed   By: Earle Gell M.D.   On: 08/10/2015 21:49   Nm Hepatobiliary Liver Func  08/09/2015  CLINICAL DATA:  RIGHT upper quadrant pain. RIGHT lower quadrant pain. Nausea and vomiting. Elevated white blood cell count. Abnormal ultrasound with gallbladder wall thickening or pericholecystic fluid. Small gallstones present. Negative sonographic Murphy's sign. EXAM: NUCLEAR MEDICINE HEPATOBILIARY IMAGING TECHNIQUE: Sequential images of the abdomen were obtained out to 60 minutes following intravenous administration of radiopharmaceutical. At 60 minutes, 2.0 mg IV morphine administered. Sixty additional minutes of imaging. RADIOPHARMACEUTICALS:  5.3 mCi Tc-37m  Choletec IV COMPARISON:  Ultrasound 08/08/2015, CT 12 4 16  FINDINGS: The radiotracer clears the blood pool with homogeneous uptake within the liver. Counts are excreted into the small bowel by 25 minutes. The gallbladder is  not evident at 60 minutes. IV morphine was administered. There is faint activity along the inferior margin the RIGHT hepatic lobe at the 60 and 65 minutes mark post morphine which is favored to represent subtle aafilling of the gallbladder. IMPRESSION: 1. Delayed filling of the gallbladder after morphine administration indicates patent cystic duct. 2. Markedly delayed filling of the gallbladder could indicate chronic cholecystitis. 3. Patent common bile duct. These results will be called to the ordering clinician or representative by the Radiologist Assistant, and communication documented in the PACS or zVision Dashboard. Electronically Signed   By: Suzy Bouchard M.D.   On: 08/09/2015 11:22   Ct Abdomen Pelvis W Contrast  08/06/2015  CLINICAL DATA:  Sudden onset of lower abdominal pain and vomiting today. Patient has a left ventricular assist device. EXAM: CT ABDOMEN AND PELVIS WITH CONTRAST TECHNIQUE: Multidetector CT imaging of the abdomen and pelvis was performed using the standard protocol following bolus administration of intravenous contrast. CONTRAST:  3mL OMNIPAQUE IOHEXOL 300 MG/ML  SOLN COMPARISON:  CT scan 04/18/2013 FINDINGS: Lower chest: The lung bases demonstrate dependent vascular crowding and atelectasis. No definite infiltrates. The heart is enlarged. A left ventricular assist device is again noted with  moderate artifact. No complicating features are demonstrated. Hepatobiliary: Small calcified granuloma and small hepatic cysts noted. No worrisome lesions. The gallbladder demonstrates dense layering gallstones and mild distension. No inflammation. No common bile duct dilatation. Pancreas: No mass, inflammation or ductal dilatation. Spleen: Normal size.  No focal lesions. Adrenals/Urinary Tract: The adrenal glands are normal. Small renal cysts but no worrisome renal lesions or hydronephrosis. Stomach/Bowel: The stomach, duodenum, small bowel and colon are grossly normal. No inflammatory  changes, mass lesions or obstructive findings. Scattered colonic diverticulosis without findings for acute diverticulitis. Vascular/Lymphatic: No mesenteric or retroperitoneal mass or adenopathy. The aorta is normal in caliber. Moderate scattered atherosclerotic calcifications. The branch vessels are patent. The major venous structures are patent. Reproductive: The uterus is surgically absent. The ovaries are not identified for certain. Other: No pelvic mass or adenopathy. No free pelvic fluid collections. No inguinal mass or adenopathy. No abdominal wall hernia or subcutaneous lesions. Musculoskeletal: Degenerative changes involving the lumbar spine but no acute bony abnormality. IMPRESSION: 1. No acute abdominal/pelvic findings, mass lesions or adenopathy. 2. Cholelithiasis.  No definite CT findings for acute cholecystitis. 3. Left ventricular assist device without complicating features. Electronically Signed   By: Marijo Sanes M.D.   On: 08/06/2015 19:22   US Abdomen Port  08/08/2015  CLINICAL DATA:  Generalized abdominal pain. EXAM: ULTRASOUND PORTABLE ABDOMEN COMPARISON:  CT scan of August 06, 2015. FINDINGS: Gallbladder: Mild cholelithiasis is noted with mild gallbladder wall thickening measuring 4 mm. No sonographic Murphy's sign is noted. There is either fluid within the gallbladder wall or a small amount of pericholecystic fluid. Common bile duct: Diameter: 2.8 mm which is within normal limits. Liver: No focal lesion identified. Within normal limits in parenchymal echogenicity. Small amount of fluid is noted around the liver. IVC: No abnormality visualized. Pancreas: Not visualized due to overlying bowel gas. Spleen: Size and appearance within normal limits. Right Kidney: Length: 11.5 cm. Echogenicity within normal limits. No mass or hydronephrosis visualized. Left Kidney: Length: 11.1 cm. 9 mm cyst is seen in midpole. Echogenicity within normal limits. No mass or hydronephrosis visualized. Abdominal  aorta: No aneurysm visualized. Other findings: None. IMPRESSION: Pancreas not visualized due to overlying bowel gas. Small amount of free fluid is noted around the liver. Mild cholelithiasis is noted with mild gallbladder wall thickening. Fluid is noted either within the gallbladder wall consistent with edema, or a small amount of pericholecystic fluid is noted. Overall, these findings are concerning for acute cholecystitis, and clinical correlation and HIDA scan is recommended for further evaluation. Electronically Signed   By: Marijo Conception, M.D.   On: 08/08/2015 15:36   Ir Perc Cholecystostomy  08/10/2015  INDICATION: 81 year old female with a history of LVAD device. She has developed leukocytosis and imaging features of acute cholecystitis on ultrasound. Equivocal nuclear medicine study 08/09/2015. EXAM: ULTRASOUND AND FLUOROSCOPIC-GUIDED CHOLECYSTOSTOMY TUBE PLACEMENT COMPARISON:  Ultrasound 08/10/2015, 08/08/2015, CT 08/06/2015, nuclear medicine study 08/09/2015 MEDICATIONS: Fentanyl 25 mcg IV; Versed 0.5 mg IV; The patient is currently admitted to the hospital and on intravenous antibiotics. Antibiotics were administered within an appropriate time frame prior to skin puncture. ANESTHESIA/SEDATION: Total Moderate Sedation Time Fifteen minutes CONTRAST:  36mL OMNIPAQUE IOHEXOL 300 MG/ML  SOLN FLUOROSCOPY TIME:  36 seconds COMPLICATIONS: None PROCEDURE: Informed written consent was obtained from the patient after a discussion of the risks, benefits and alternatives to treatment. Questions regarding the procedure were encouraged and answered. A timeout was performed prior to the initiation of the procedure. The right upper  abdominal quadrant was prepped and draped in the usual sterile fashion, and a sterile drape was applied covering the operative field. Maximum barrier sterile technique with sterile gowns and gloves were used for the procedure. A timeout was performed prior to the initiation of the  procedure. Local anesthesia was provided with 1% lidocaine with epinephrine. Ultrasound scanning of the right upper quadrant demonstrates a markedly dilated gallbladder. Of note, the patient reported pain with ultrasound imaging over the gallbladder. Utilizing a transhepatic approach, a 22 gauge needle was advanced into the gallbladder under direct ultrasound guidance. An ultrasound image was saved for documentation purposes. Appropriate intraluminal puncture was confirmed with the efflux of bile and advancement of an 0.018 wire into the gallbladder lumen. The needle was exchanged for an Gosper set. A small amount of contrast was injected to confirm appropriate intraluminal positioning. Over a Benson wire, a 63.2-French Cook cholecystomy tube was advanced into the gallbladder fossa, coiled and locked. Bile was aspirated and a small amount of contrast was injected as several post procedural spot radiographic images were obtained in various obliquities. The catheter was secured to the skin with suture, connected to a drainage bag and a dressing was placed. The patient tolerated the procedure well without immediate post procedural complication. IMPRESSION: Status post percutaneous cholecystostomy placement. Sample was sent to the lab for analysis. Signed, Dulcy Fanny. Earleen Newport, DO [facility] Electronically Signed   By: Corrie Mckusick D.O.   On: 08/10/2015 17:43   Dg Sinus/fist Tube Chk-non Gi  09/05/2015  CLINICAL DATA:  Status post percutaneous cholecystostomy tube placement on 08/10/2015 to treat cholecystitis. Tube injection with cholangiogram is now performed to assess patency of the cystic duct and extrahepatic bile ducts. EXAM: CHOLANGIOGRAM THROUGH PRE-EXISTING PERCUTANEOUS CHOLECYSTOSTOMY TUBE COMPARISON:  CT of the abdomen earlier today as well as prior imaging during cholecystostomy on 08/10/2015. ANESTHESIA/SEDATION: None CONTRAST:  10 mL Omnipaque  300 MEDICATIONS: None FLUOROSCOPY TIME:  1 minute. PROCEDURE: Contrast injection was performed via the cholecystostomy tube and fluoroscopic spot images were obtained. The tube was then reconnected to a new gravity drainage bag. COMPLICATIONS: None FINDINGS: There is normal filling of the gallbladder which contains some probable small filling defects consistent with calculi. The gallbladder is nondistended. There is prompt visualization of the cystic duct which appears widely patent and without evidence of obstruction. A normal opacified common bile duct is identified with prompt drainage into the duodenum. Visualized proximal intrahepatic ducts are normally patent. IMPRESSION: Cholangiogram shows a normally patent cystic duct and normally patent extrahepatic bile ducts without evidence of filling defect or obstruction. Some small filling defects in the gallbladder lumen likely correspond to calculi. Additional CT and clinical evaluation were performed today and dictated separately. The cholecystostomy tube was left to gravity bag drainage as it has not quite been 4 weeks since tube placement and the patient also has an upcoming surgical follow-up appointment with Dr. Hulen Skains. Electronically Signed   By: Aletta Edouard M.D.   On: 09/05/2015 12:27   Dg Chest Port 1 View  08/10/2015  CLINICAL DATA:  Shortness of breath. EXAM: PORTABLE CHEST 1 VIEW COMPARISON:  08/09/2015. FINDINGS: Right PICC line in stable position. Prior CABG. Cardiac pacer and left ventricular assist device in stable position. Persistent low lung volumes with bibasilar atelectasis. Small bilateral pleural effusions cannot be excluded. No pneumothorax. IMPRESSION: 1. Right PICC line in stable position. 2. Prior CABG. Cardiac pacer and left ventricular assist device in stable position. Stable cardiomegaly. No  pulmonary venous congestion. 3. Persistent low lung volumes with bibasilar atelectasis. Small bilateral pleural effusions cannot be excluded.  No pneumothorax. Electronically Signed   By: Marcello Moores  Register   On: 08/10/2015 07:20   Dg Chest Port 1 View  08/09/2015  CLINICAL DATA:  Central catheter placement EXAM: PORTABLE CHEST 1 VIEW COMPARISON:  August 06, 2015 FINDINGS: Central catheter tip is in the superior vena cava. No pneumothorax. Pacemaker is present with lead tips attached to the right atrium and right ventricle. There is a left ventricular assist device present. There is atelectatic change in the left base. The lungs are otherwise clear. The heart is mildly enlarged with pulmonary vascularity within normal limits. No adenopathy. IMPRESSION: Central catheter tip in superior vena cava. No pneumothorax. Left base atelectasis. Lungs elsewhere clear. Heart mildly enlarged with left ventricular assist device present. Pacemaker leads attached to right atrium and right ventricle. Electronically Signed   By: Lowella Grip III M.D.   On: 08/09/2015 17:18   Dg Chest Portable 1 View  08/06/2015  CLINICAL DATA:  Abdominal pain and vomiting. EXAM: PORTABLE CHEST 1 VIEW COMPARISON:  11/09/2014 FINDINGS: Left ventricular assist device is stable. Dual lead cardiac pacemaker is also in stable position. Median sternotomy wires are intact. Cardiomediastinal silhouette is mildly enlarged. Mediastinal contours appear intact. There is no evidence of focal airspace consolidation, pleural effusion or pneumothorax. There is mild prominence of the interstitial markings. Osseous structures are without acute abnormality. Soft tissues are grossly normal. IMPRESSION: Mild prominence of the interstitial markings which may be seen with pulmonary vascular congestion. Mild cardiomegaly. Electronically Signed   By: Fidela Salisbury M.D.   On: 08/06/2015 19:00   US Abdomen Limited Ruq  08/10/2015  ADDENDUM REPORT: 08/10/2015 09:22 ADDENDUM: Study discussed by telephone with [name] On 08/10/2015 at 0913 hours. Electronically Signed   By: Genevie Ann M.D.   On:  08/10/2015 09:22  08/10/2015  CLINICAL DATA:  81 year old female with right upper quadrant pain and nuclear medicine hepatobiliary study findings suggesting chronic cholecystitis. Cholelithiasis. Initial encounter. EXAM: US ABDOMEN LIMITED - RIGHT UPPER QUADRANT COMPARISON:  Nuclear medicine hepato biliary scan 08/09/2015. CT Abdomen and Pelvis 08/06/2015. FINDINGS: Gallbladder: Gallbladder wall thickening up to 8 mm, with widespread wall heterogeneity. Dependent sludge. Shadowing echogenic mostly gravel type gallstones. These might individually measure up to 11 mm (image 20). Trace pericholecystic fluid. No sonographic Murphy sign elicited. Common bile duct: Diameter: 4 mm, normal Liver: No intrahepatic biliary ductal dilatation. Small central hepatic cyst measuring 9 mm re - demonstrated and appears simple (image 33). No other discrete liver lesion. Other findings: Visible right kidney without hydronephrosis. Small volume perihepatic free fluid, in part contiguous with the gallbladder fossa. IMPRESSION: 1. Markedly abnormal gallbladder wall, edematous and irregular with pericholecystic fluid and small volume perihepatic free fluid. Constellation of imaging findings since 08/06/2015 are most compatible with Acute on Chronic Cholecystitis. 2. No evidence of biliary obstruction at this time. Electronically Signed: By: Genevie Ann M.D. On: 08/10/2015 07:42    Labs:  CBC:  Recent Labs  08/16/15 0520 08/17/15 0540 08/18/15 0430 08/21/15 1513  WBC 17.9* 16.7* 14.2* 10.6*  HGB 8.6* 8.5* 8.3* 8.8*  HCT 28.1* 29.0* 26.7* 29.4*  PLT 263 301 313 452*    COAGS:  Recent Labs  08/16/15 0520 08/17/15 0540 08/18/15 0430 08/21/15 1513  INR 3.87* 2.92* 2.62* 2.41*    BMP:  Recent Labs  08/15/15 0415 08/16/15 0520 08/18/15 0430 08/21/15 1513  NA 140 138 136  136  K 3.5 3.9 3.8 4.1  CL 106 103 103 105  CO2 27 28 26 22   GLUCOSE 78 95 84 95  BUN 6 5* 7 7  CALCIUM 8.2* 8.4* 8.1* 9.1  CREATININE  1.41* 1.36* 1.46* 1.63*  GFRNONAA 35* 36* 33* 29*  GFRAA 40* 42* 39* 34*    LIVER FUNCTION TESTS:  Recent Labs  08/13/15 0315 08/14/15 0344 08/16/15 0520 08/21/15 1513  BILITOT 0.8 0.9 0.5 0.4  AST 20 22 24 23   ALT 28 25 20 17   ALKPHOS 89 94 103 111  PROT 6.8 6.5 7.3 8.8*  ALBUMIN 2.0* 2.0* 1.9* 2.6*    TUMOR MARKERS: No results for input(s): AFPTM, CEA, CA199, CHROMGRNA in the last 8760 hours.  Assessment and Plan:  A cholangiogram was performed with injection of the cholecystostomy tube today under fluoroscopy. This report is separately dictated. This demonstrated a normally patent cystic duct and extrahepatic bile ducts without evidence of obstruction or filling defects. Some small filling defects in the gallbladder lumen are present likely corresponding to calculi. Contrast is seen to freely enter the duodenum.  It is still too early to remove the cholecystostomy tube as it has been in place for 3-1/2 weeks. The patient also has an upcoming surgical follow-up appointment with Dr. Hulen Skains. We will await Dr. Richarda Blade opinion about need for continued percutaneous drainage of the gallbladder. Her heart disease and LVAD makes her a poor candidate to undergo cholecystectomy.   Should Mrs. Schmidtberger require eventual cholecystostomy tube removal, I would recommend that that be done approximately 6 weeks after original placement and at the hospital such that we can perform a tractogram, if necessary, prior to removing the catheter.   SignedAletta Edouard T 09/05/2015, 2:06 PM   I spent a total of 15 Minutes in face to face in clinical consultation, greater than 50% of which was counseling/coordinating care for indwelling cholecystostomy tube.

## 2015-09-06 ENCOUNTER — Other Ambulatory Visit (INDEPENDENT_AMBULATORY_CARE_PROVIDER_SITE_OTHER): Payer: Medicare Other | Admitting: *Deleted

## 2015-09-06 ENCOUNTER — Ambulatory Visit (HOSPITAL_COMMUNITY): Payer: Self-pay | Admitting: *Deleted

## 2015-09-06 DIAGNOSIS — Z7901 Long term (current) use of anticoagulants: Secondary | ICD-10-CM

## 2015-09-06 DIAGNOSIS — Z4682 Encounter for fitting and adjustment of non-vascular catheter: Secondary | ICD-10-CM | POA: Diagnosis not present

## 2015-09-06 DIAGNOSIS — Z5181 Encounter for therapeutic drug level monitoring: Secondary | ICD-10-CM | POA: Diagnosis not present

## 2015-09-06 DIAGNOSIS — K812 Acute cholecystitis with chronic cholecystitis: Secondary | ICD-10-CM | POA: Diagnosis not present

## 2015-09-06 DIAGNOSIS — N184 Chronic kidney disease, stage 4 (severe): Secondary | ICD-10-CM | POA: Diagnosis not present

## 2015-09-06 DIAGNOSIS — I5022 Chronic systolic (congestive) heart failure: Secondary | ICD-10-CM | POA: Diagnosis not present

## 2015-09-06 DIAGNOSIS — D509 Iron deficiency anemia, unspecified: Secondary | ICD-10-CM | POA: Diagnosis not present

## 2015-09-06 DIAGNOSIS — I130 Hypertensive heart and chronic kidney disease with heart failure and stage 1 through stage 4 chronic kidney disease, or unspecified chronic kidney disease: Secondary | ICD-10-CM | POA: Diagnosis not present

## 2015-09-06 LAB — PROTIME-INR
INR: 2.87 — ABNORMAL HIGH (ref 0.00–1.49)
Prothrombin Time: 29.6 seconds — ABNORMAL HIGH (ref 11.6–15.2)

## 2015-09-06 NOTE — Addendum Note (Signed)
Addended by: Eulis Foster on: 09/06/2015 10:41 AM   Modules accepted: Orders

## 2015-09-12 ENCOUNTER — Other Ambulatory Visit (HOSPITAL_COMMUNITY): Payer: Self-pay | Admitting: *Deleted

## 2015-09-12 ENCOUNTER — Encounter (HOSPITAL_COMMUNITY): Payer: Medicare Other

## 2015-09-12 DIAGNOSIS — Z95811 Presence of heart assist device: Secondary | ICD-10-CM

## 2015-09-12 DIAGNOSIS — Z7901 Long term (current) use of anticoagulants: Secondary | ICD-10-CM

## 2015-09-12 DIAGNOSIS — K819 Cholecystitis, unspecified: Secondary | ICD-10-CM

## 2015-09-13 ENCOUNTER — Ambulatory Visit (HOSPITAL_COMMUNITY): Payer: Self-pay | Admitting: *Deleted

## 2015-09-13 ENCOUNTER — Other Ambulatory Visit: Payer: Self-pay | Admitting: General Surgery

## 2015-09-13 ENCOUNTER — Ambulatory Visit (HOSPITAL_COMMUNITY)
Admission: RE | Admit: 2015-09-13 | Discharge: 2015-09-13 | Disposition: A | Discharge status: Home / Self Care | Payer: Medicare Other | Source: Ambulatory Visit | Attending: Internal Medicine | Admitting: Internal Medicine

## 2015-09-13 ENCOUNTER — Telehealth (HOSPITAL_COMMUNITY): Payer: Self-pay | Admitting: *Deleted

## 2015-09-13 DIAGNOSIS — I110 Hypertensive heart disease with heart failure: Secondary | ICD-10-CM | POA: Diagnosis not present

## 2015-09-13 DIAGNOSIS — I5022 Chronic systolic (congestive) heart failure: Secondary | ICD-10-CM | POA: Diagnosis not present

## 2015-09-13 DIAGNOSIS — Z7901 Long term (current) use of anticoagulants: Secondary | ICD-10-CM | POA: Diagnosis not present

## 2015-09-13 DIAGNOSIS — I480 Paroxysmal atrial fibrillation: Secondary | ICD-10-CM | POA: Insufficient documentation

## 2015-09-13 DIAGNOSIS — Z95811 Presence of heart assist device: Secondary | ICD-10-CM

## 2015-09-13 DIAGNOSIS — I10 Essential (primary) hypertension: Secondary | ICD-10-CM

## 2015-09-13 DIAGNOSIS — I509 Heart failure, unspecified: Secondary | ICD-10-CM

## 2015-09-13 DIAGNOSIS — K810 Acute cholecystitis: Secondary | ICD-10-CM | POA: Diagnosis not present

## 2015-09-13 DIAGNOSIS — K8020 Calculus of gallbladder without cholecystitis without obstruction: Secondary | ICD-10-CM | POA: Diagnosis not present

## 2015-09-13 DIAGNOSIS — E876 Hypokalemia: Secondary | ICD-10-CM

## 2015-09-13 DIAGNOSIS — I50810 Right heart failure, unspecified: Secondary | ICD-10-CM

## 2015-09-13 DIAGNOSIS — K819 Cholecystitis, unspecified: Secondary | ICD-10-CM

## 2015-09-13 DIAGNOSIS — Z95810 Presence of automatic (implantable) cardiac defibrillator: Secondary | ICD-10-CM | POA: Diagnosis not present

## 2015-09-13 LAB — CBC
HCT: 31.5 % — ABNORMAL LOW (ref 36.0–46.0)
Hemoglobin: 9.9 g/dL — ABNORMAL LOW (ref 12.0–15.0)
MCH: 27 pg (ref 26.0–34.0)
MCHC: 31.4 g/dL (ref 30.0–36.0)
MCV: 86.1 fL (ref 78.0–100.0)
Platelets: 204 10*3/uL (ref 150–400)
RBC: 3.66 MIL/uL — ABNORMAL LOW (ref 3.87–5.11)
RDW: 26.4 % — ABNORMAL HIGH (ref 11.5–15.5)
WBC: 5.9 10*3/uL (ref 4.0–10.5)

## 2015-09-13 LAB — COMPREHENSIVE METABOLIC PANEL
ALT: 16 U/L (ref 14–54)
AST: 26 U/L (ref 15–41)
Albumin: 3 g/dL — ABNORMAL LOW (ref 3.5–5.0)
Alkaline Phosphatase: 83 U/L (ref 38–126)
Anion gap: 11 (ref 5–15)
BUN: 6 mg/dL (ref 6–20)
CO2: 23 mmol/L (ref 22–32)
Calcium: 9 mg/dL (ref 8.9–10.3)
Chloride: 106 mmol/L (ref 101–111)
Creatinine, Ser: 1.21 mg/dL — ABNORMAL HIGH (ref 0.44–1.00)
GFR calc Af Amer: 48 mL/min — ABNORMAL LOW (ref >60)
GFR calc non Af Amer: 42 mL/min — ABNORMAL LOW (ref >60)
Glucose, Bld: 87 mg/dL (ref 65–99)
Potassium: 2.8 mmol/L — ABNORMAL LOW (ref 3.5–5.1)
Sodium: 140 mmol/L (ref 135–145)
Total Bilirubin: 0.4 mg/dL (ref 0.3–1.2)
Total Protein: 8.1 g/dL (ref 6.5–8.1)

## 2015-09-13 LAB — LACTATE DEHYDROGENASE: LDH: 222 U/L — ABNORMAL HIGH (ref 98–192)

## 2015-09-13 LAB — PROTIME-INR
INR: 2.97 — ABNORMAL HIGH (ref 0.00–1.49)
Prothrombin Time: 30.4 seconds — ABNORMAL HIGH (ref 11.6–15.2)

## 2015-09-13 MED ORDER — CARVEDILOL 12.5 MG PO TABS: 6.2500 mg | ORAL_TABLET | Freq: Two times a day (BID) | ORAL | Status: DC

## 2015-09-13 MED ORDER — LOSARTAN POTASSIUM 50 MG PO TABS: 50.0000 mg | ORAL_TABLET | Freq: Every day | ORAL | Status: DC

## 2015-09-13 MED ORDER — POTASSIUM CHLORIDE ER 20 MEQ PO TBCR: 40.0000 meq | EXTENDED_RELEASE_TABLET | Freq: Every day | ORAL | Status: DC

## 2015-09-13 NOTE — Patient Instructions (Signed)
1.  Restart Losartan 50 mg daily. 2   Restart Coreg 6.25 mg twice daily.  3.  Return to [place] clinic in two seeks. 4.  Will call you with lab results and coumadin dosing.

## 2015-09-13 NOTE — Progress Notes (Signed)
  Symptom Yes No Details  Angina  x Activity:  Claudication  x How far:  Syncope  x When:  Stroke  x   Orthopnea x  How many pillows:  PND  x How often:  CPAP  x How many hrs:  Pedal edema x  Occasionally at night. Clears by the morning.   Abd fullness  x   N&V  x   Diaphoresis  x When:  Bleeding  x   Urine  x Clear yellow   SOB x  Activity: some shortness of breath walking around.   Palpitations  x When:  ICD shock  x   Hospitlizaitons  x When/where/why:   ED visit  x When/where/why:  Other MD  x When/who/why:  Activity  x   Fluid  x   Diet  x    Vital Signs: Doppler BP: 108 Automatic BP: 108/75 (95) HR: 88 SPO2: 99 (RA) Weight: 171 lbs Last weight:  176.6 lbs  VAD interrogation & Equipment Management: Speed: 9200 Flow:  3.6 Power: 4.7 w PI: 7.6 Alarms:  None  Events:  10 -20 daily   Fixed speed: 9200 Low speed limit: 8600  Primary Controller:  Replace back up battery in 6 months. Back up controller:   Replace back up battery in 6 months.   I reviewed the LVAD parameters from today and compared the results to the patient's prior recorded data.  LVAD interrogation was NEGATIVE for significant power changes, NEGATIVE for clinical alarms and positive for PI events/speed drops. No programming changes were made and pump is functioning within specified parameters.   LVAD equipment check completed and is in good working order. Back-up equipment present. LVAD education done on emergency procedures and precautions and reviewed exit site care.   Exit Site Care: Drive line is being maintained weekly by her husband Brianna Hanson. Dressing site is C/D/I with a smaller sorbaview dressing to accommodate the location of the chole-drain currently.   Drive line exit site well healed and incorporated. The velour is fully implanted at exit site. Dressing dry and intact. No erythema or drainage. Stabilization device present and accurately applied. Driveline dressing is being changed weekly  per sterile technique using Sorbaview dressing with biopatch on exit site. Pt denies fever or chills. Provided with daily dressing kits for IR drain management.   Pt has appt with Dr. Donne Hazel this afternoon at 2 pm for surgical consult for GB.   Patient Instructions: 1.  Restart Losartan 50 mg daily. 2   Restart Coreg 6.25 mg twice daily.  3.  Return to [place] clinic in two seeks. 4.  Will call you with lab results and coumadin dosing.  Brianna Girt, RN VAD Coordinator  Office: [phone] 24/7 VAD Pager: [phone]

## 2015-09-13 NOTE — Telephone Encounter (Signed)
Called pt/husband per [name] re: low K+ of 2.8. Instructed to take K-dur 40 meq x 3 doses then 40 meq daily. Rx sent. Brianna Hanson verbalized understanding of same.

## 2015-09-14 ENCOUNTER — Other Ambulatory Visit (HOSPITAL_COMMUNITY): Payer: Self-pay | Admitting: *Deleted

## 2015-09-14 DIAGNOSIS — K819 Cholecystitis, unspecified: Secondary | ICD-10-CM

## 2015-09-14 DIAGNOSIS — I130 Hypertensive heart and chronic kidney disease with heart failure and stage 1 through stage 4 chronic kidney disease, or unspecified chronic kidney disease: Secondary | ICD-10-CM | POA: Diagnosis not present

## 2015-09-14 DIAGNOSIS — Z4682 Encounter for fitting and adjustment of non-vascular catheter: Secondary | ICD-10-CM | POA: Diagnosis not present

## 2015-09-14 DIAGNOSIS — K812 Acute cholecystitis with chronic cholecystitis: Secondary | ICD-10-CM | POA: Diagnosis not present

## 2015-09-14 DIAGNOSIS — I5022 Chronic systolic (congestive) heart failure: Secondary | ICD-10-CM | POA: Diagnosis not present

## 2015-09-14 DIAGNOSIS — D509 Iron deficiency anemia, unspecified: Secondary | ICD-10-CM | POA: Diagnosis not present

## 2015-09-14 DIAGNOSIS — N184 Chronic kidney disease, stage 4 (severe): Secondary | ICD-10-CM | POA: Diagnosis not present

## 2015-09-14 MED ORDER — CIPROFLOXACIN HCL 500 MG PO TABS: 500.0000 mg | ORAL_TABLET | Freq: Two times a day (BID) | ORAL | Status: DC

## 2015-09-14 NOTE — Progress Notes (Signed)
Patient ID: ATASIA GLEAVES, female   DOB: 1935/06/19, 81 y.o.   MRN: [id number]   ADVANCED HF CLINIC NOTE  Patient ID: AMAN NOURSE, female   DOB: [birth year], 81 y.o.   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 [name] (follwoed q 6 months). Underwent implantation of the HeartMate II LVAD on 04/06/13 for DT. She is not on aspirin due to dizziness.   Admitted to [facility] from 12/4 to 08/18/15 for acute cholecystitis. Underwent placement of a cholecystotomy drain by IR. Feeling much better. Drainage from C-tube has tapered off greatly < 100/per week.  No further ab pain. Getting stronger. Able to do ADLs  Denies SOB/PND/Orthopnea. No edema. Weight stable.  No bleeding problems. Significant other performs weekly dressing changes.  She wants C-tube removed. Seen in IR drain clinic and cholangiogram shows patent ducts with residual gallstones.   Denies LVAD alarms.  Denies driveline trauma, erythema or drainage.  Denies ICD shocks.   Reports taking Coumadin as prescribed.  Denies bright red blood per rectum or melena, no dark urine or hematuria.    VAD interrogation & Equipment Management:  Speed: 9200  Flow: 3.6  Power: 4.7 w  PI: 7.6  Alarms: None  Events: 10 -20 daily   Fixed speed: 9200  Low speed limit: 8600   Echo (7/15) with LV EF 30%.    Past Medical History  Diagnosis Date  . Cardiac arrest - ventricular fibrillation 12/10    with successful resucitation, S/p ICD  . ICD (implantable cardiac defibrillator) in place     she has received appropriate therapy for VF  . Atrial fibrillation or flutter   . Osteopenia   . HTN (hypertension)     moderate  . Nonischemic cardiomyopathy ([place])     followed by Dr April Holding at [facility]  . CHF (congestive heart failure) ([place])   . Anemia   . Plasma cell  disorder 03/20/2012  . Plasma cell disorder 03/20/2012  . Sessile colonic polyp 2011    Dr Benson Norway  . Diverticula of colon 2011  . Internal and external hemorrhoids without complication [id number]    Current Outpatient Prescriptions  Medication Sig Dispense Refill  . amiodarone (PACERONE) 200 MG tablet TAKE 1 TABLET BY MOUTH EVERY DAY 30 tablet 11  . Cholecalciferol (VITAMIN D3) 1000 UNITS CAPS Take 1 tablet by mouth daily.      [name] docusate sodium (COLACE) 100 MG capsule Take 1 capsule (100 mg total) by mouth 2 (two) times daily. 60 capsule 6  . ferrous sulfate 325 (65 FE) MG tablet Take 1 tablet (325 mg total) by mouth 2 (two) times daily with a meal. 60 tablet 6  . gabapentin (NEURONTIN) 100 MG capsule TAKE 1 CAPSULE BY MOUTH AT BEDTIME OR AS DIRECTED 30 capsule 5  . hydrALAZINE (APRESOLINE) 100 MG tablet Take 0.5 tablets (50 mg total) by mouth 3 (three) times daily. 90 tablet 6  . pantoprazole (PROTONIX) 40 MG tablet TAKE 1 TABLET BY MOUTH EVERY DAY 30 tablet 6  . spironolactone (ALDACTONE) 25 MG tablet TAKE 1 TABLET BY MOUTH EVERY DAY 30 tablet 6  . traMADol (ULTRAM) 50 MG tablet Take 1 tablet (50 mg total) by mouth every 6 (six) hours as needed for moderate pain. for pain 30 tablet  3  . warfarin (COUMADIN) 2.5 MG tablet Take 2.5-5 mg by mouth daily. 2.5mg  daily except 5mg  on Wednesdays    . carvedilol (COREG) 12.5 MG tablet Take 0.5 tablets (6.25 mg total) by mouth 2 (two) times daily with a meal. 60 tablet 6  . ciprofloxacin (CIPRO) 500 MG tablet Take 1 tablet (500 mg total) by mouth 2 (two) times daily. 14 tablet 0  . losartan (COZAAR) 50 MG tablet Take 1 tablet (50 mg total) by mouth daily. 30 tablet 6  . potassium chloride 20 MEQ TBCR Take 40 mEq by mouth daily. Take 40 meq x three doses then 40 meq daily. 80 tablet 6  . torsemide (DEMADEX) 20 MG tablet Take 1 tablet (20 mg total) by mouth daily as needed. weight gain 3 lbs in 24 hours (Patient not taking: Reported on 09/13/2015) 15 tablet 6    No current facility-administered medications for this encounter.    Review of patient's allergies indicates no known allergies.  REVIEW OF SYSTEMS: All systems negative except as listed in HPI, PMH and Problem list.   Vital Signs:  Doppler BP: 108  Automatic BP: 108/75 (95)  HR: 88  SPO2: 99 (RA)  Weight: 171 lbs  Last weight: 176.6 lbs    Physical Exam: GENERAL: Well appearing, female, NAD, [name] present HEENT: normal  NECK: Supple, JVP 5-6,  2+ bilaterally, no bruits.  No lymphadenopathy or thyromegaly appreciated.   CARDIAC:  LVAD hum present.  LUNGS:  Clear to auscultation bilaterally.  ABDOMEN:  Soft, round, nontender, positive bowel sounds x4.  Drain in RUQ drain with minimal fluid in bag LVAD exit site: Dressing dry and intact.  No erythema or drainage.  Stabilization device present and accurately applied. Driveline dressing changed weekly. EXTREMITIES:  Warm and dry, no cyanosis, clubbing, rash, edema  NEUROLOGIC:  Alert and oriented x 4.  Gait steady.  No aphasia.  No dysarthria.  Affect pleasant.     ASSESSMENT AND PLAN:  1) Chronic systolic HF: NICM, s/p ICD and LVAD for DT(04/2013). Getting stronger post-hospital. Able to do ADLs.Volume status ok. - Meds cut back in hospital due to low BP - Will restart losartan 50 daily and carvedilol 6.25 bid (was on 18.75 bid) - Continue 25 mg spironolactone daily. - Continue hydralazine 50 mg three times a day.  - Continue weekly dressing changes.  - Reinforced the need and importance of daily weights, a low sodium diet, and fluid restriction (less than 2 L a day). Instructed to call the HF clinic if weight increases more than 3 lbs overnight or 5 lbs in a week.  2) Acute cholecystitis: - Improved with C-tube. Now with minimal drainage. After she left clinic for this visit she saw Dr. Donne Hazel who is planning lap chole. We will help coordinate scheduling and heparin bridge 3) LVAD placed for DT 04/2013: All parameters stable.  (reviewed personally) - Check LDH, INR, CBC and BMET today.  - Remains on coumadin.   Not on aspirin due to intolerance. - VAD parameters look good 4) PAF: Stable. Continue amiodarone to 100 mg daily. Follow TSH and LFTsNeeds year eye exams.    5) HTN: Increasing ftfer meds cut back. Restart losartan and carvedilol as above.   6) Chronic Anticoagulation: Goal INR 2-3 Continue coumadin. Check INR   Follow up in 3-4 weeks  Glori Bickers  MD  09/14/2015

## 2015-09-15 ENCOUNTER — Ambulatory Visit: Payer: Medicare Other | Admitting: Internal Medicine

## 2015-09-20 DIAGNOSIS — D509 Iron deficiency anemia, unspecified: Secondary | ICD-10-CM | POA: Diagnosis not present

## 2015-09-20 DIAGNOSIS — N184 Chronic kidney disease, stage 4 (severe): Secondary | ICD-10-CM | POA: Diagnosis not present

## 2015-09-20 DIAGNOSIS — I130 Hypertensive heart and chronic kidney disease with heart failure and stage 1 through stage 4 chronic kidney disease, or unspecified chronic kidney disease: Secondary | ICD-10-CM | POA: Diagnosis not present

## 2015-09-20 DIAGNOSIS — Z4682 Encounter for fitting and adjustment of non-vascular catheter: Secondary | ICD-10-CM | POA: Diagnosis not present

## 2015-09-20 DIAGNOSIS — K812 Acute cholecystitis with chronic cholecystitis: Secondary | ICD-10-CM | POA: Diagnosis not present

## 2015-09-20 DIAGNOSIS — I5022 Chronic systolic (congestive) heart failure: Secondary | ICD-10-CM | POA: Diagnosis not present

## 2015-09-25 ENCOUNTER — Other Ambulatory Visit (HOSPITAL_COMMUNITY): Payer: Self-pay | Admitting: *Deleted

## 2015-09-25 DIAGNOSIS — K819 Cholecystitis, unspecified: Secondary | ICD-10-CM

## 2015-09-25 MED ORDER — CIPROFLOXACIN HCL 500 MG PO TABS: 500.0000 mg | ORAL_TABLET | Freq: Two times a day (BID) | ORAL | Status: DC

## 2015-09-27 ENCOUNTER — Other Ambulatory Visit: Payer: Medicare Other

## 2015-09-27 ENCOUNTER — Other Ambulatory Visit (HOSPITAL_COMMUNITY): Payer: Self-pay | Admitting: Adult Health

## 2015-09-27 ENCOUNTER — Encounter (HOSPITAL_COMMUNITY): Payer: Medicare Other

## 2015-09-28 ENCOUNTER — Telehealth (HOSPITAL_COMMUNITY): Payer: Self-pay | Admitting: Infectious Diseases

## 2015-09-28 ENCOUNTER — Inpatient Hospital Stay (HOSPITAL_COMMUNITY): Payer: Medicare Other | Admitting: Vascular Surgery

## 2015-09-28 ENCOUNTER — Encounter (HOSPITAL_COMMUNITY): Payer: Self-pay

## 2015-09-28 ENCOUNTER — Ambulatory Visit (HOSPITAL_COMMUNITY): Payer: Self-pay | Admitting: Infectious Diseases

## 2015-09-28 ENCOUNTER — Encounter (HOSPITAL_COMMUNITY)
Admission: RE | Admit: 2015-09-28 | Discharge: 2015-09-28 | Disposition: A | Discharge status: Home / Self Care | Payer: Medicare Other | Source: Ambulatory Visit | Attending: General Surgery | Admitting: General Surgery

## 2015-09-28 DIAGNOSIS — K8020 Calculus of gallbladder without cholecystitis without obstruction: Secondary | ICD-10-CM | POA: Diagnosis not present

## 2015-09-28 DIAGNOSIS — Z01818 Encounter for other preprocedural examination: Secondary | ICD-10-CM | POA: Diagnosis not present

## 2015-09-28 DIAGNOSIS — Z95811 Presence of heart assist device: Secondary | ICD-10-CM | POA: Insufficient documentation

## 2015-09-28 DIAGNOSIS — I5022 Chronic systolic (congestive) heart failure: Secondary | ICD-10-CM | POA: Insufficient documentation

## 2015-09-28 DIAGNOSIS — I110 Hypertensive heart disease with heart failure: Secondary | ICD-10-CM | POA: Insufficient documentation

## 2015-09-28 DIAGNOSIS — I428 Other cardiomyopathies: Secondary | ICD-10-CM | POA: Diagnosis not present

## 2015-09-28 DIAGNOSIS — Z01812 Encounter for preprocedural laboratory examination: Secondary | ICD-10-CM | POA: Insufficient documentation

## 2015-09-28 DIAGNOSIS — Z79899 Other long term (current) drug therapy: Secondary | ICD-10-CM | POA: Diagnosis not present

## 2015-09-28 DIAGNOSIS — Z7901 Long term (current) use of anticoagulants: Secondary | ICD-10-CM | POA: Insufficient documentation

## 2015-09-28 HISTORY — DX: Presence of automatic (implantable) cardiac defibrillator: Z95.810

## 2015-09-28 LAB — BASIC METABOLIC PANEL
Anion gap: 7 (ref 5–15)
BUN: 21 mg/dL — ABNORMAL HIGH (ref 6–20)
CO2: 20 mmol/L — ABNORMAL LOW (ref 22–32)
Calcium: 9.5 mg/dL (ref 8.9–10.3)
Chloride: 111 mmol/L (ref 101–111)
Creatinine, Ser: 1.55 mg/dL — ABNORMAL HIGH (ref 0.44–1.00)
GFR calc Af Amer: 36 mL/min — ABNORMAL LOW (ref >60)
GFR calc non Af Amer: 31 mL/min — ABNORMAL LOW (ref >60)
Glucose, Bld: 90 mg/dL (ref 65–99)
Potassium: 5.1 mmol/L (ref 3.5–5.1)
Sodium: 138 mmol/L (ref 135–145)

## 2015-09-28 LAB — CBC
HCT: 34.4 % — ABNORMAL LOW (ref 36.0–46.0)
Hemoglobin: 10.8 g/dL — ABNORMAL LOW (ref 12.0–15.0)
MCH: 27.8 pg (ref 26.0–34.0)
MCHC: 31.4 g/dL (ref 30.0–36.0)
MCV: 88.7 fL (ref 78.0–100.0)
Platelets: 199 10*3/uL (ref 150–400)
RBC: 3.88 MIL/uL (ref 3.87–5.11)
RDW: 24.7 % — ABNORMAL HIGH (ref 11.5–15.5)
WBC: 6.1 10*3/uL (ref 4.0–10.5)

## 2015-09-28 LAB — PROTIME-INR
INR: 2.55 — ABNORMAL HIGH (ref 0.00–1.49)
Prothrombin Time: 27.1 seconds — ABNORMAL HIGH (ref 11.6–15.2)

## 2015-09-28 LAB — APTT: aPTT: 42 seconds — ABNORMAL HIGH (ref 24–37)

## 2015-09-28 NOTE — Pre-Procedure Instructions (Signed)
    SADINA HAISTEN  09/28/2015      [id number] [place], [place] [phone] [facility] [place] [address] [place] [address] Phone: [phone] Fax: [phone]  [id number] [place], [place] [address] AT [facility] [address] [place] [address] Phone: [phone] Fax: [phone]    Your procedure is scheduled on 10/05/15.  Report to [facility] Admitting at 530 A.M.  Call this number if you have problems the morning of surgery:  [phone]   Remember:  Do not eat food or drink liquids after midnight.  Take these medicines the morning of surgery with A SIP OF WATER --pacerone,cardedilol,neurontin,cipro,hydralazine,prontonix,tramdol   Do not wear jewelry, make-up or nail polish.  Do not wear lotions, powders, or perfumes.  You may wear deodorant.  Do not shave 48 hours prior to surgery.  Men may shave face and neck.  Do not bring valuables to the hospital.  [facility] is not responsible for any belongings or valuables.  Contacts, dentures or bridgework may not be worn into surgery.  Leave your suitcase in the car.  After surgery it may be brought to your room.  For patients admitted to the hospital, discharge time will be determined by your treatment team.  Patients discharged the day of surgery will not be allowed to drive home.   Name and phone number of your driver:    Special instructions:   Please read over the following fact sheets that you were given. Pain Booklet, Coughing and Deep Breathing and Surgical Site Infection Prevention

## 2015-09-28 NOTE — Telephone Encounter (Signed)
Called with instructions regarding coumadin for the weekend. D/W Ileene Patrick AHF PharmD and will have her last dose of coumadin be Saturday evening in preparation for Tuesday admission for heparin bridge for lap chole on Thursday. Verbalized understanding.   Janene Madeira, RN VAD Coordinator   Office: [phone] 24/7 VAD Pager: [phone]

## 2015-09-28 NOTE — Progress Notes (Signed)
icd form faxed to Dr Rayann Heman.   Also [employer] notified for LVAD admittance. She will be admitted ON the jan 31 for bridge.

## 2015-09-29 NOTE — Progress Notes (Signed)
Anesthesia Chart Review: Patient is a 81 year old female scheduled for laparoscopic cholecystectomy on 10/05/15 by Dr Donne Hazel.  History includes non-ischemic CM (diagnosed '99), vfib arrest s/p resuscitation followed by insertion of [employer] ICD '10, chronic systolic CHF, s/p HeartMate II LVAD 04/06/13, afib/flutter, HTN, anemia, plasma cell disorder (likely IgA MGUS), hysterectomy. Admission 12/4-12/16/16 for acute cholecystitis s/p cholecystotomy drain by IR. BMI is consistent with obesity.  Cardiologist is [name] who is aware of surgery plans. Planned admission 10/03/15 for heparin bridge. Zada Girt, RN VAD Coordinator is aware of surgery date/time. EP Cardiologist is Dr. Rayann Heman. CT Surgeon is Dr. Bascom Levels. Prescott Gum. Hematology is Dr. [name]. PCP is Dr. Scarlette Calico.  Meds include amiodarone, Coreg, Cipro, 65 Fe, Neurontin, hydralazine, losartan, Protonix, spironolactone, tramadol, warfarin (INR goal 2-3). Per VAD Coordinator Janene Madeira, RN notes, patient to take her last warfarin dose on 10/01/15 PM with plans for 10/03/15 admission for heparin bridge before cholecystectomy on 10/05/15.    01/19/15 EKG: A-paced, low voltage QRS, possible anterolateral infarct (age undetermined), ST/T wave abnormality, consider inferior infarct. She is not pacer dependent.  05/11/15 Echo: Study Conclusions - Left ventricle: Inferior septal and apical akinesis . LVAD cannula in apex free of septum. The cavity size was mildly dilated. Systolic function was moderately to severely reduced. The estimated ejection fraction was in the range of 30% to 35%. - Right ventricle: The cavity size was severely dilated. - Right atrium: The atrium was moderately dilated. - Atrial septum: No defect or patent foramen ovale was identified.  03/12/13 LHC/RHC (PRE-LVAD):  1. Normal coronaries. 2. Severe biventricular dysfunction.  3. Moderate PAH. RV function still remains marginal.   03/01/13 Carotid U/S:  Bilateral carotid - There is no evidence of significant ICA stenosis. <40% ICA stenosis bilaterally. Vertebral artery flow is antegrade.  08/10/15 1V CXR: IMPRESSION: 1. Right PICC line in stable position. 2. Prior CABG. Cardiac pacer and left ventricular assist device in stable position. Stable cardiomegaly. No pulmonary venous congestion. 3. Persistent low lung volumes with bibasilar atelectasis. Small bilateral pleural effusions cannot be excluded. No pneumothorax.  03/01/13 PFTs: FVC 1.71 (95%), FEV1 1.33 (97%), DLCO 46%.   09/28/15 Labs noted. Cr 1.55, which appears to be within her baseline when compared to 08/2015 labs. Glucose 90. H/H 10.8/34.4. INR 2.55. PTT 42. She is being admitted prior to surgery by cardiology. Defer additional lab orders to admitting service as general surgery.  [facility] Short Stay Center/Anesthesiology Phone [phone] 09/29/2015 6:46 PM

## 2015-10-03 ENCOUNTER — Inpatient Hospital Stay (HOSPITAL_COMMUNITY)
Admission: RE | Admit: 2015-10-03 | Discharge: 2015-10-17 | DRG: 418 | Disposition: A | Discharge status: Home Health | Payer: Medicare Other | Source: Ambulatory Visit | Attending: Internal Medicine | Admitting: Internal Medicine

## 2015-10-03 ENCOUNTER — Encounter (HOSPITAL_COMMUNITY): Payer: Self-pay

## 2015-10-03 DIAGNOSIS — I4891 Unspecified atrial fibrillation: Secondary | ICD-10-CM | POA: Diagnosis not present

## 2015-10-03 DIAGNOSIS — D472 Monoclonal gammopathy: Secondary | ICD-10-CM | POA: Diagnosis present

## 2015-10-03 DIAGNOSIS — M8580 Other specified disorders of bone density and structure, unspecified site: Secondary | ICD-10-CM | POA: Diagnosis present

## 2015-10-03 DIAGNOSIS — Z8674 Personal history of sudden cardiac arrest: Secondary | ICD-10-CM

## 2015-10-03 DIAGNOSIS — I493 Ventricular premature depolarization: Secondary | ICD-10-CM | POA: Diagnosis not present

## 2015-10-03 DIAGNOSIS — I959 Hypotension, unspecified: Secondary | ICD-10-CM | POA: Diagnosis not present

## 2015-10-03 DIAGNOSIS — Z792 Long term (current) use of antibiotics: Secondary | ICD-10-CM

## 2015-10-03 DIAGNOSIS — I5022 Chronic systolic (congestive) heart failure: Secondary | ICD-10-CM | POA: Diagnosis present

## 2015-10-03 DIAGNOSIS — Z7901 Long term (current) use of anticoagulants: Secondary | ICD-10-CM

## 2015-10-03 DIAGNOSIS — K660 Peritoneal adhesions (postprocedural) (postinfection): Secondary | ICD-10-CM | POA: Diagnosis present

## 2015-10-03 DIAGNOSIS — K9189 Other postprocedural complications and disorders of digestive system: Secondary | ICD-10-CM | POA: Diagnosis not present

## 2015-10-03 DIAGNOSIS — I472 Ventricular tachycardia: Secondary | ICD-10-CM | POA: Diagnosis not present

## 2015-10-03 DIAGNOSIS — R918 Other nonspecific abnormal finding of lung field: Secondary | ICD-10-CM | POA: Diagnosis not present

## 2015-10-03 DIAGNOSIS — I480 Paroxysmal atrial fibrillation: Secondary | ICD-10-CM | POA: Diagnosis present

## 2015-10-03 DIAGNOSIS — D62 Acute posthemorrhagic anemia: Secondary | ICD-10-CM | POA: Diagnosis not present

## 2015-10-03 DIAGNOSIS — K8010 Calculus of gallbladder with chronic cholecystitis without obstruction: Principal | ICD-10-CM | POA: Diagnosis present

## 2015-10-03 DIAGNOSIS — K8012 Calculus of gallbladder with acute and chronic cholecystitis without obstruction: Secondary | ICD-10-CM | POA: Diagnosis not present

## 2015-10-03 DIAGNOSIS — I429 Cardiomyopathy, unspecified: Secondary | ICD-10-CM | POA: Diagnosis present

## 2015-10-03 DIAGNOSIS — Z86010 Personal history of colonic polyps: Secondary | ICD-10-CM | POA: Diagnosis not present

## 2015-10-03 DIAGNOSIS — Z9049 Acquired absence of other specified parts of digestive tract: Secondary | ICD-10-CM | POA: Diagnosis not present

## 2015-10-03 DIAGNOSIS — K567 Ileus, unspecified: Secondary | ICD-10-CM | POA: Diagnosis not present

## 2015-10-03 DIAGNOSIS — I50810 Right heart failure, unspecified: Secondary | ICD-10-CM

## 2015-10-03 DIAGNOSIS — K811 Chronic cholecystitis: Secondary | ICD-10-CM | POA: Diagnosis not present

## 2015-10-03 DIAGNOSIS — D649 Anemia, unspecified: Secondary | ICD-10-CM | POA: Diagnosis not present

## 2015-10-03 DIAGNOSIS — I9589 Other hypotension: Secondary | ICD-10-CM | POA: Diagnosis not present

## 2015-10-03 DIAGNOSIS — Z95810 Presence of automatic (implantable) cardiac defibrillator: Secondary | ICD-10-CM

## 2015-10-03 DIAGNOSIS — I130 Hypertensive heart and chronic kidney disease with heart failure and stage 1 through stage 4 chronic kidney disease, or unspecified chronic kidney disease: Secondary | ICD-10-CM | POA: Diagnosis present

## 2015-10-03 DIAGNOSIS — Z95811 Presence of heart assist device: Secondary | ICD-10-CM | POA: Diagnosis not present

## 2015-10-03 DIAGNOSIS — K913 Postprocedural intestinal obstruction: Secondary | ICD-10-CM | POA: Diagnosis not present

## 2015-10-03 DIAGNOSIS — Z959 Presence of cardiac and vascular implant and graft, unspecified: Secondary | ICD-10-CM

## 2015-10-03 DIAGNOSIS — Z01818 Encounter for other preprocedural examination: Secondary | ICD-10-CM | POA: Diagnosis not present

## 2015-10-03 DIAGNOSIS — N183 Chronic kidney disease, stage 3 (moderate): Secondary | ICD-10-CM | POA: Diagnosis present

## 2015-10-03 DIAGNOSIS — I509 Heart failure, unspecified: Secondary | ICD-10-CM | POA: Diagnosis not present

## 2015-10-03 DIAGNOSIS — K8020 Calculus of gallbladder without cholecystitis without obstruction: Secondary | ICD-10-CM | POA: Diagnosis not present

## 2015-10-03 DIAGNOSIS — K819 Cholecystitis, unspecified: Secondary | ICD-10-CM | POA: Diagnosis not present

## 2015-10-03 DIAGNOSIS — J479 Bronchiectasis, uncomplicated: Secondary | ICD-10-CM | POA: Diagnosis not present

## 2015-10-03 LAB — COMPREHENSIVE METABOLIC PANEL
ALT: 24 U/L (ref 14–54)
AST: 28 U/L (ref 15–41)
Albumin: 3.4 g/dL — ABNORMAL LOW (ref 3.5–5.0)
Alkaline Phosphatase: 83 U/L (ref 38–126)
Anion gap: 9 (ref 5–15)
BUN: 25 mg/dL — ABNORMAL HIGH (ref 6–20)
CO2: 23 mmol/L (ref 22–32)
Calcium: 9.5 mg/dL (ref 8.9–10.3)
Chloride: 108 mmol/L (ref 101–111)
Creatinine, Ser: 1.67 mg/dL — ABNORMAL HIGH (ref 0.44–1.00)
GFR calc Af Amer: 33 mL/min — ABNORMAL LOW (ref >60)
GFR calc non Af Amer: 28 mL/min — ABNORMAL LOW (ref >60)
Glucose, Bld: 94 mg/dL (ref 65–99)
Potassium: 5 mmol/L (ref 3.5–5.1)
Sodium: 140 mmol/L (ref 135–145)
Total Bilirubin: 0.5 mg/dL (ref 0.3–1.2)
Total Protein: 8.5 g/dL — ABNORMAL HIGH (ref 6.5–8.1)

## 2015-10-03 LAB — LACTATE DEHYDROGENASE: LDH: 213 U/L — ABNORMAL HIGH (ref 98–192)

## 2015-10-03 LAB — PROTIME-INR
INR: 1.91 — ABNORMAL HIGH (ref 0.00–1.49)
INR: 1.93 — ABNORMAL HIGH (ref 0.00–1.49)
Prothrombin Time: 21.8 seconds — ABNORMAL HIGH (ref 11.6–15.2)
Prothrombin Time: 22 seconds — ABNORMAL HIGH (ref 11.6–15.2)

## 2015-10-03 LAB — MRSA PCR SCREENING: MRSA by PCR: NEGATIVE

## 2015-10-03 LAB — MAGNESIUM: Magnesium: 2 mg/dL (ref 1.7–2.4)

## 2015-10-03 MED ORDER — CARVEDILOL 6.25 MG PO TABS
6.2500 mg | ORAL_TABLET | Freq: Two times a day (BID) | ORAL | Status: DC
  Administered 2015-10-03 – 2015-10-08 (×9): 6.25 mg via ORAL
  Filled 2015-10-03 (×10): qty 1

## 2015-10-03 MED ORDER — VITAMIN D 1000 UNITS PO TABS
1000.0000 [IU] | ORAL_TABLET | Freq: Every day | ORAL | Status: DC
  Administered 2015-10-04 – 2015-10-17 (×13): 1000 [IU] via ORAL
  Filled 2015-10-03 (×13): qty 1

## 2015-10-03 MED ORDER — DOCUSATE SODIUM 100 MG PO CAPS
100.0000 mg | ORAL_CAPSULE | Freq: Two times a day (BID) | ORAL | Status: DC
  Administered 2015-10-03 – 2015-10-16 (×23): 100 mg via ORAL
  Filled 2015-10-03 (×24): qty 1

## 2015-10-03 MED ORDER — AMIODARONE HCL 200 MG PO TABS: 200.0000 mg | ORAL_TABLET | Freq: Every day | ORAL | Status: DC

## 2015-10-03 MED ORDER — GABAPENTIN 100 MG PO CAPS
100.0000 mg | ORAL_CAPSULE | Freq: Every day | ORAL | Status: DC
  Administered 2015-10-03 – 2015-10-16 (×14): 100 mg via ORAL
  Filled 2015-10-03 (×14): qty 1

## 2015-10-03 MED ORDER — AMIODARONE HCL 200 MG PO TABS
100.0000 mg | ORAL_TABLET | Freq: Every day | ORAL | Status: DC
  Administered 2015-10-04 – 2015-10-17 (×13): 100 mg via ORAL
  Filled 2015-10-03 (×13): qty 1

## 2015-10-03 MED ORDER — TRAMADOL HCL 50 MG PO TABS
50.0000 mg | ORAL_TABLET | Freq: Four times a day (QID) | ORAL | Status: DC | PRN
Start: 2015-10-03 — End: 2015-10-17
  Administered 2015-10-10 – 2015-10-16 (×5): 50 mg via ORAL
  Filled 2015-10-03 (×5): qty 1

## 2015-10-03 MED ORDER — CIPROFLOXACIN HCL 500 MG PO TABS
500.0000 mg | ORAL_TABLET | Freq: Two times a day (BID) | ORAL | Status: DC
  Administered 2015-10-03 – 2015-10-08 (×10): 500 mg via ORAL
  Filled 2015-10-03 (×17): qty 1

## 2015-10-03 MED ORDER — LOSARTAN POTASSIUM 50 MG PO TABS
50.0000 mg | ORAL_TABLET | Freq: Every day | ORAL | Status: DC
  Administered 2015-10-04 – 2015-10-08 (×5): 50 mg via ORAL
  Filled 2015-10-03 (×5): qty 1

## 2015-10-03 MED ORDER — HYDRALAZINE HCL 50 MG PO TABS
50.0000 mg | ORAL_TABLET | Freq: Three times a day (TID) | ORAL | Status: DC
  Administered 2015-10-03 – 2015-10-06 (×11): 50 mg via ORAL
  Filled 2015-10-03 (×11): qty 1

## 2015-10-03 MED ORDER — PANTOPRAZOLE SODIUM 40 MG PO TBEC
40.0000 mg | DELAYED_RELEASE_TABLET | Freq: Every day | ORAL | Status: DC
  Administered 2015-10-03 – 2015-10-17 (×14): 40 mg via ORAL
  Filled 2015-10-03 (×14): qty 1

## 2015-10-03 MED ORDER — SPIRONOLACTONE 25 MG PO TABS
25.0000 mg | ORAL_TABLET | Freq: Every day | ORAL | Status: DC
  Administered 2015-10-04 – 2015-10-08 (×5): 25 mg via ORAL
  Filled 2015-10-03 (×5): qty 1

## 2015-10-03 MED ORDER — FERROUS SULFATE 325 (65 FE) MG PO TABS
325.0000 mg | ORAL_TABLET | Freq: Two times a day (BID) | ORAL | Status: DC
  Administered 2015-10-03 – 2015-10-08 (×10): 325 mg via ORAL
  Filled 2015-10-03 (×10): qty 1

## 2015-10-03 MED ORDER — POTASSIUM CHLORIDE ER 10 MEQ PO TBCR: 40.0000 meq | EXTENDED_RELEASE_TABLET | Freq: Every day | ORAL | Status: DC

## 2015-10-03 NOTE — Progress Notes (Signed)
[facility]  Patient Status: Active (receiving services up to time of hospitalization)  [facility] is providing the following services: RN  If patient discharges after hours, please call [phone].   Brianna Hanson 10/03/2015, 2:11 PM

## 2015-10-03 NOTE — Progress Notes (Signed)
Patient arrived to [phone] and is a direct admit from home. Patient is in no apparent distress and has no complaints at this time. VAD team aware.

## 2015-10-03 NOTE — H&P (Addendum)
Patient ID: Brianna Hanson, female DOB: August 07, 1935, 81 y.o. MRN: [id number]   ADVANCED HF H&P   Patient ID: Brianna Hanson, female DOB: Feb 21, 1935, 81 y.o. 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 [name] (follwoed q 6 months). Underwent implantation of the HeartMate II LVAD on 04/06/13 for DT. She is not on aspirin due to dizziness.   Admitted to [facility] from 12/4 to 08/18/15 for acute cholecystitis. Underwent placement of a cholecystotomy drain by IR. Feeling much better. Drainage from C-tube has tapered off greatly < 100/per week. No further ab pain. Getting stronger. She has been seen by Dr. Donne Hazel and has lap chole planned for Thursday.  Admitted today for heparin bridge for lap chole.. Denies SOB/PND/Orthopnea. No edema. Weight stable. No bleeding problems. Last dose of coumadin 09/30/15.   No LVAD alarms. No driveline trauma, erythema or drainage. No ICD shocks.    VAD interrogation & Equipment Management:  Speed: 9200  Flow: 3.6  Power: 4.7 w  PI: 7.6  Alarms: None  Events: 10 -20 daily   Fixed speed: 9200  Low speed limit: 8600   Echo (7/15) with LV EF 30%.    Past Medical History  Diagnosis Date  . Cardiac arrest - ventricular fibrillation 12/10    with successful resucitation, S/p ICD  . ICD (implantable cardiac defibrillator) in place     she has received appropriate therapy for VF  . Atrial fibrillation or flutter   . Osteopenia   . HTN (hypertension)     moderate  . Nonischemic cardiomyopathy ([place])     followed by Dr April Holding at [facility]  . CHF (congestive heart failure) ([employer])   . Anemia   . Plasma cell disorder 03/20/2012  . Plasma cell disorder 03/20/2012  . Sessile colonic polyp 2011    Dr Benson Norway    . Diverticula of colon 2011  . Internal and external hemorrhoids without complication [id number]    Current Outpatient Prescriptions  Medication Sig Dispense Refill  . amiodarone (PACERONE) 200 MG tablet TAKE 1 TABLET BY MOUTH EVERY DAY 30 tablet 11  . Cholecalciferol (VITAMIN D3) 1000 UNITS CAPS Take 1 tablet by mouth daily.     [name] docusate sodium (COLACE) 100 MG capsule Take 1 capsule (100 mg total) by mouth 2 (two) times daily. 60 capsule 6  . ferrous sulfate 325 (65 FE) MG tablet Take 1 tablet (325 mg total) by mouth 2 (two) times daily with a meal. 60 tablet 6  . gabapentin (NEURONTIN) 100 MG capsule TAKE 1 CAPSULE BY MOUTH AT BEDTIME OR AS DIRECTED 30 capsule 5  . hydrALAZINE (APRESOLINE) 100 MG tablet Take 0.5 tablets (50 mg total) by mouth 3 (three) times daily. 90 tablet 6  . pantoprazole (PROTONIX) 40 MG tablet TAKE 1 TABLET BY MOUTH EVERY DAY 30 tablet 6  . spironolactone (ALDACTONE) 25 MG tablet TAKE 1 TABLET BY MOUTH EVERY DAY 30 tablet 6  . traMADol (ULTRAM) 50 MG tablet Take 1 tablet (50 mg total) by mouth every 6 (six) hours as needed for moderate pain. for pain 30 tablet 3  . warfarin (COUMADIN) 2.5 MG tablet Take 2.5-5 mg by mouth daily. 2.5mg  daily except 5mg  on Wednesdays    . carvedilol (COREG) 12.5 MG tablet Take 0.5 tablets (6.25 mg total) by  mouth 2 (two) times daily with a meal. 60 tablet 6  . ciprofloxacin (CIPRO) 500 MG tablet Take 1 tablet (500 mg total) by mouth 2 (two) times daily. 14 tablet 0  . losartan (COZAAR) 50 MG tablet Take 1 tablet (50 mg total) by mouth daily. 30 tablet 6  . potassium chloride 20 MEQ TBCR Take 40 mEq by mouth daily. Take 40 meq x three doses then 40 meq daily. 80 tablet 6  . torsemide (DEMADEX) 20 MG tablet Take 1 tablet (20 mg total) by mouth daily as needed. weight gain 3 lbs in 24 hours (Patient not taking: Reported on 09/13/2015) 15 tablet 6   No  current facility-administered medications for this encounter.    Review of patient's allergies indicates no known allergies.  REVIEW OF SYSTEMS: All systems negative except as listed in HPI, PMH and Problem list.   Vital Signs:  Doppler BP: 108  Automatic BP: 108/75 (95)  HR: 88  SPO2: 99 (RA)  Weight: 171 lbs  Last weight: 176.6 lbs    Physical Exam: GENERAL: Well appearing, female, NAD HEENT: normal  NECK: Supple, JVP 5-6, 2+ bilaterally, no bruits. No thyromegaly or nodule note.  CARDIAC: LVAD hum present.  LUNGS: CTAB, normal effort ABDOMEN: Soft, NT, ND, no HSM. No bruits or masses. +BS  Drain in RUQ drain with minimal fluid in bag LVAD exit site: Dressing dry and intact. No erythema or drainage. Stabilization device present and accurately applied. Driveline dressing changed weekly. EXTREMITIES: Warm and dry, no cyanosis, clubbing, rash, edema  NEUROLOGIC: Alert and oriented x 4. Gait steady. No aphasia. No dysarthria. Affect pleasant.   ASSESSMENT AND PLAN:  1) Chronic cholecystitis: - Improved with C-tube. Now with minimal drainage. - Lap chole planned with Dr Donne Hazel 10/05/15 - Admitting today for heparin bridge. Start heparin with no bolus when INR < 1.8  2) Chronic systolic HF: NICM, s/p ICD and LVAD for DT(04/2013).  - Stable with VAD support. Volume status looks good. Continue current meds   3) PAF: Maintaining NSR. - Continue amiodarone 100 mg daily.   4) HTN:  - well controlled. follow maps. Adjust meds as needed.  - Continue losartan and carvedilol as above.    As above. HF stable. Being admitted INR-bridging with heparin prior to lap chole later this week with Dr Donne Hazel.   Pharmacy to dose heparin so long as INR < 1.8.  Legrand Como [address] [place], PA-C 10/03/2015 12:38 PM   VAD Team Pager [phone] (7am - 7am)  - VAD ISSUES ONLY -  Advanced Heart Failure Team Pager [phone] (M-F; [place])  Please contact [place]  [facility] for night-coverage after hours (4p -7a ) and weekends on [email or web address]  Patient seen and examined with Oda Kilts, PA-C. We discussed all aspects of the encounter. I agree with the assessment and plan as stated above.   I have edited note above with my findings. Admitted for heparin bridge in anticipation of lap chole on Thursday. Aware of risks of possible need to transition to open chole. Will start heparin when INR < 1.8. Discussed with Pharmacy. VAD parameters stable.  Temeca Somma,MD 3:26 PM

## 2015-10-03 NOTE — Care Management Note (Signed)
Case Management Note  Patient Details  Name: Brianna Hanson MRN: [id number] Date of Birth: [birth year]  Subjective/Objective:   Adm w gb surg                 Action/Plan: lives w fam, pcp dr Ronnald Ramp   Expected Discharge Date:                  Expected Discharge Plan:  [place]  In-House Referral:     Discharge planning Services  CM Consult  Post Acute Care Choice:  Resumption of Svcs/PTA Provider Choice offered to:     DME Arranged:    DME Agency:     HH Arranged:  RN [place] Agency:  [place]  Status of Service:     Medicare Important Message Given:    Date Medicare IM Given:    Medicare IM give by:    Date Additional Medicare IM Given:    Additional Medicare Important Message give by:     If discussed at [place] of Stay Meetings, dates discussed:    Additional Comments: ur review done, alerted donna w [facility] of adm. Pt has home o2 w [facility].  Lacretia Leigh, RN 10/03/2015, 2:04 PM

## 2015-10-03 NOTE — Progress Notes (Addendum)
ANTICOAGULATION CONSULT NOTE - Initial Consult  Pharmacy Consult for heparin Indication: Heparin bridge with LVAD  No Known Allergies  Patient Measurements: Height: 5' (152.4 cm) Weight: 157 lb 6.5 oz (71.4 kg) IBW/kg (Calculated) : 45.5 Heparin Dosing Weight: 60kg  Vital Signs: Temp: 98 F (36.7 C) (01/31 1400) Temp Source: Oral (01/31 1400) BP: 101/85 mmHg (01/31 1400) Pulse Rate: 111 (01/31 1400)  Labs:  Recent Labs  10/03/15 1344  LABPROT 21.8*  INR 1.91*  CREATININE 1.67*    Estimated Creatinine Clearance: 24.5 mL/min (by C-G formula based on Cr of 1.67).   Medical History: Past Medical History  Diagnosis Date  . Cardiac arrest - ventricular fibrillation 12/10    with successful resucitation, S/p ICD  . ICD (implantable cardiac defibrillator) in place     she has received appropriate therapy for VF  . Atrial fibrillation or flutter   . Osteopenia   . HTN (hypertension)     moderate  . Nonischemic cardiomyopathy ([place])     followed by Dr April Holding at [facility]  . CHF (congestive heart failure) ([place])   . Anemia   . Plasma cell disorder 03/20/2012  . Plasma cell disorder 03/20/2012  . Sessile colonic polyp 2011    Dr Benson Norway  . Diverticula of colon 2011  . Internal and external hemorrhoids without complication [id number]  . AICD (automatic cardioverter/defibrillator) present    Assessment:  Brianna Hanson is a 81 year old female with a PMH of HF due to severe NICM, chronic systolic HF, PAF, plasma cell disorder (Likely IgA MGUS) - followed by Dr. [name] (follwoed q 6 months). Underwent implantation of the HeartMate II LVAD on 04/06/13 for DT.  Patient being admitted for Lap chole with likely c-drain removal on 2/2. Plan to start heparin bridge once INR <1.8. INR is currently 1.9 and after discussion with MD will recheck this evening and if <1.8 start heparin without bolus. Last dose of warfarin was 1/28.  Goal of Therapy:  INR 2-3 Heparin level 0.3-0.7 units/ml Monitor  platelets by anticoagulation protocol: Yes   Plan:  Check INR tonight - start heparin if <1.8 (no bolus) INR in am  Erin Hearing PharmD., BCPS Clinical Pharmacist Pager [phone] 10/03/2015 3:18 PM

## 2015-10-04 ENCOUNTER — Inpatient Hospital Stay (HOSPITAL_COMMUNITY): Payer: Medicare Other

## 2015-10-04 LAB — HEPARIN LEVEL (UNFRACTIONATED)
Heparin Unfractionated: 0.4 IU/mL (ref 0.30–0.70)
Heparin Unfractionated: 0.33 IU/mL (ref 0.30–0.70)

## 2015-10-04 LAB — CBC
HCT: 34.7 % — ABNORMAL LOW (ref 36.0–46.0)
Hemoglobin: 11.1 g/dL — ABNORMAL LOW (ref 12.0–15.0)
MCH: 28.2 pg (ref 26.0–34.0)
MCHC: 32 g/dL (ref 30.0–36.0)
MCV: 88.3 fL (ref 78.0–100.0)
Platelets: 194 10*3/uL (ref 150–400)
RBC: 3.93 MIL/uL (ref 3.87–5.11)
RDW: 22.8 % — ABNORMAL HIGH (ref 11.5–15.5)
WBC: 6.4 10*3/uL (ref 4.0–10.5)

## 2015-10-04 LAB — BASIC METABOLIC PANEL
Anion gap: 8 (ref 5–15)
BUN: 25 mg/dL — ABNORMAL HIGH (ref 6–20)
CO2: 20 mmol/L — ABNORMAL LOW (ref 22–32)
Calcium: 9.1 mg/dL (ref 8.9–10.3)
Chloride: 110 mmol/L (ref 101–111)
Creatinine, Ser: 1.38 mg/dL — ABNORMAL HIGH (ref 0.44–1.00)
GFR calc Af Amer: 41 mL/min — ABNORMAL LOW (ref >60)
GFR calc non Af Amer: 36 mL/min — ABNORMAL LOW (ref >60)
Glucose, Bld: 90 mg/dL (ref 65–99)
Potassium: 4.6 mmol/L (ref 3.5–5.1)
Sodium: 138 mmol/L (ref 135–145)

## 2015-10-04 LAB — PROTIME-INR
INR: 1.77 — ABNORMAL HIGH (ref 0.00–1.49)
Prothrombin Time: 20.6 seconds — ABNORMAL HIGH (ref 11.6–15.2)

## 2015-10-04 LAB — LACTATE DEHYDROGENASE: LDH: 208 U/L — ABNORMAL HIGH (ref 98–192)

## 2015-10-04 MED ORDER — CEFAZOLIN SODIUM-DEXTROSE 2-3 GM-% IV SOLR
2.0000 g | INTRAVENOUS | Status: DC
  Filled 2015-10-04: qty 50

## 2015-10-04 MED ORDER — HEPARIN (PORCINE) IN NACL 100-0.45 UNIT/ML-% IJ SOLN
1000.0000 [IU]/h | INTRAMUSCULAR | Status: AC
  Administered 2015-10-04: 900 [IU]/h via INTRAVENOUS
  Filled 2015-10-04: qty 250

## 2015-10-04 NOTE — Progress Notes (Signed)
Patient ID: Brianna Hanson, female   DOB: [birth year], 81 y.o.   MRN: [id number] Admitted preop, has been doing well, tube in place, heparin on will be off later tonight, inr in am, npo after mn, plan for lap chole in am as discussed.

## 2015-10-04 NOTE — Progress Notes (Signed)
HeartMate 2 Rounding Note  Subjective:    Doing well. Has lap choley scheduled for tomorrow.   INR 1.77 this am. Heparin started.   LVAD INTERROGATION:  HeartMate II LVAD:  Flow 3.4 liters/min, speed 9200, power 4.6, PI 7.5. 5 PI events.  Objective:    Vital Signs:   Temp:  [97.8 F (36.6 C)-98.1 F (36.7 C)] 98.1 F (36.7 C) (02/01 0343) Pulse Rate:  [111] 111 (01/31 1400) Resp:  [16-20] 20 (02/01 0343) BP: (88-110)/(67-90) 97/67 mmHg (02/01 0346) SpO2:  [98 %-100 %] 98 % (02/01 0343) Weight:  [157 lb 6.5 oz (71.4 kg)-159 lb 6.3 oz (72.3 kg)] 159 lb 6.3 oz (72.3 kg) (02/01 0343) Last BM Date: 10/03/15 Mean arterial Pressure 70-80s  Intake/Output:   Intake/Output Summary (Last 24 hours) at 10/04/15 0736 Last data filed at 10/04/15 0400  Gross per 24 hour  Intake  243.3 ml  Output    350 ml  Net -106.7 ml     Physical Exam: GENERAL: Well appearing, female, NAD HEENT: normal  NECK: Supple, JVP 6-7, 2+ bilaterally, no bruits. No thyromegaly or nodule note.  CARDIAC: LVAD hum present.  LUNGS: CTAB, normal effort ABDOMEN: Soft, nontender, non distended no HSM. No bruits or masses. +BS RUQ drain with minimal fluid in bag LVAD exit site: Dressing dry and intact. No erythema or drainage. Stabilization device present and accurately applied. Driveline dressing changed weekly. EXTREMITIES: Warm and dry, no cyanosis, clubbing, rash. Trace ankle edema  NEUROLOGIC: Alert and oriented x 4. Gait steady. No aphasia. No dysarthria. Affect pleasant.   Telemetry: Reviewed personally, NSR 70s  Labs: Basic Metabolic Panel:  Recent Labs Lab 09/28/15 1112 10/03/15 1344 10/04/15 0252  NA 138 140 138  K 5.1 5.0 4.6  CL 111 108 110  CO2 20* 23 20*  GLUCOSE 90 94 90  BUN 21* 25* 25*  CREATININE 1.55* 1.67* 1.38*  CALCIUM 9.5 9.5 9.1  MG  --  2.0  --     Liver Function Tests:  Recent Labs Lab 10/03/15 1344  AST 28  ALT 24  ALKPHOS 83  BILITOT  0.5  PROT 8.5*  ALBUMIN 3.4*   No results for input(s): LIPASE, AMYLASE in the last 168 hours. No results for input(s): AMMONIA in the last 168 hours.  CBC:  Recent Labs Lab 09/28/15 1112  WBC 6.1  HGB 10.8*  HCT 34.4*  MCV 88.7  PLT 199    INR:  Recent Labs Lab 09/28/15 1112 10/03/15 1344 10/03/15 1910 10/04/15 0252  INR 2.55* 1.91* 1.93* 1.77*    Other results:  Imaging:  No results found.   Medications:     Scheduled Medications: . amiodarone  100 mg Oral Daily  . carvedilol  6.25 mg Oral BID WC  . cholecalciferol  1,000 Units Oral Daily  . ciprofloxacin  500 mg Oral BID  . docusate sodium  100 mg Oral BID  . ferrous sulfate  325 mg Oral BID WC  . gabapentin  100 mg Oral QHS  . hydrALAZINE  50 mg Oral TID  . losartan  50 mg Oral Daily  . pantoprazole  40 mg Oral Daily  . spironolactone  25 mg Oral Daily     Infusions: . heparin 900 Units/hr (10/04/15 0358)     PRN Medications:  traMADol   Assessment:   1. Chronic cholecystitis 2. Chronic systolic HF s/p LVAD HMII implantation. 3. PAF 4. HTN  Plan/Discussion:    INR 1.93 => 1.77. Heparin  started this morning. Dosing per pharm.   Continue amio 100 mg daily for her PAF.  MAPs stable so far in 70-80s. Continue losartan and coreg.   Will be NPO after midnight for lap chole tomorrow.   Disposition afterwards based on surgeon recommendations + therapeutic INR.   I reviewed the LVAD parameters from today, and compared the results to the patient's prior recorded data.  No programming changes were made.  The LVAD is functioning within specified parameters.  The patient performs LVAD self-test daily.  LVAD interrogation was negative for any significant power changes, alarms or PI events/speed drops.  LVAD equipment check completed and is in good working order.  Back-up equipment present.   LVAD education done on emergency procedures and precautions and reviewed exit site care.  Length of  Stay: 1  Annamaria Helling 10/04/2015, 7:36 AM  VAD Team --- VAD ISSUES ONLY--- Pager [phone] (7am - 7am)  Advanced Heart Failure Team  Pager [phone] (M-F; 7a - 4p)  Please contact [facility] for night-coverage after hours (4p -7a ) and weekends on [email or web address]  Patient seen and examined with Oda Kilts, PA-C. We discussed all aspects of the encounter. I agree with the assessment and plan as stated above.   INR now 1.7. Now on heparin. Case discussed with Dr. Donne Hazel. Will stop heparin at 3a. Keep NPO after MN for surgery tomorrow. HF stable. VAD parameters look good.  [name], Daniel,MD 3:46 PM

## 2015-10-04 NOTE — Progress Notes (Signed)
ANTICOAGULATION CONSULT NOTE - Follow Up Consult  Pharmacy Consult for heparin Indication: LVAD  Labs:  Recent Labs  10/03/15 1344 10/03/15 1910 10/04/15 0252  LABPROT 21.8* 22.0* 20.6*  INR 1.91* 1.93* 1.77*  CREATININE 1.67*  --   --     Assessment: 81yo female now w/ INR <1.8, Coumadin on hold while awaiting procedure, to begin heparin.  Goal of Therapy:  Heparin level 0.3-0.7 units/ml   Plan:  Will begin heparin gtt at 900 units/hr and check level in [place], PharmD, BCPS  10/04/2015,3:32 AM

## 2015-10-04 NOTE — Progress Notes (Signed)
[place] for heparin Indication: Heparin bridge with LVAD  No Known Allergies  Patient Measurements: Height: 5' (152.4 cm) Weight: 159 lb 6.3 oz (72.3 kg) IBW/kg (Calculated) : 45.5 Heparin Dosing Weight: 60kg  Vital Signs: Temp: 98.1 F (36.7 C) (02/01 0343) Temp Source: Oral (02/01 0343) BP: 97/67 mmHg (02/01 0346)  Labs:  Recent Labs  10/03/15 1344 10/03/15 1910 10/04/15 0252  LABPROT 21.8* 22.0* 20.6*  INR 1.91* 1.93* 1.77*  CREATININE 1.67*  --  1.38*    Estimated Creatinine Clearance: 29.8 mL/min (by C-G formula based on Cr of 1.38).   Medical History: Past Medical History  Diagnosis Date  . Cardiac arrest - ventricular fibrillation 12/10    with successful resucitation, S/p ICD  . ICD (implantable cardiac defibrillator) in place     she has received appropriate therapy for VF  . Atrial fibrillation or flutter   . Osteopenia   . HTN (hypertension)     moderate  . Nonischemic cardiomyopathy ([place])     followed by Dr April Holding at [facility]  . CHF (congestive heart failure) ([place])   . Anemia   . Plasma cell disorder 03/20/2012  . Plasma cell disorder 03/20/2012  . Sessile colonic polyp 2011    Dr Benson Norway  . Diverticula of colon 2011  . Internal and external hemorrhoids without complication [id number]  . AICD (automatic cardioverter/defibrillator) present    Assessment:  Ms Evatt is a 81 year old female with a PMH of HF due to severe NICM, chronic systolic HF, PAF, plasma cell disorder (Likely IgA MGUS) - followed by Dr. [name] (follwoed q 6 months). Underwent implantation of the HeartMate II LVAD on 04/06/13 for DT.  Patient being admitted for Lap chole with likely c-drain removal on 2/2.   INR down to 1.7, heparin started this am. Initial level at goal on 900 units/hr. No bleeding issues noted.  Goal of Therapy:  INR 2-3 Heparin level 0.3-0.7 units/ml Monitor platelets by anticoagulation protocol: Yes   Plan:  Continue  heparin at 900 units/hr Check confirmatory HL tonight  Erin Hearing PharmD., BCPS Clinical Pharmacist Pager [phone] 10/04/2015 7:38 AM

## 2015-10-04 NOTE — Progress Notes (Signed)
ANTICOAGULATION CONSULT NOTE   Pharmacy Consult for heparin Indication: Heparin bridge with LVAD  No Known Allergies  Patient Measurements: Height: 5' (152.4 cm) Weight: 159 lb 6.3 oz (72.3 kg) IBW/kg (Calculated) : 45.5 Heparin Dosing Weight: 60kg  Vital Signs: Temp: 97.7 F (36.5 C) (02/01 1652) Temp Source: Oral (02/01 1652) BP: 82/51 mmHg (02/01 1930) Pulse Rate: 77 (02/01 1652)  Labs:  Recent Labs  10/03/15 1344 10/03/15 1910 10/04/15 0252 10/04/15 0817 10/04/15 1201 10/04/15 1932  HGB  --   --   --  11.1*  --   --   HCT  --   --   --  34.7*  --   --   PLT  --   --   --  194  --   --   LABPROT 21.8* 22.0* 20.6*  --   --   --   INR 1.91* 1.93* 1.77*  --   --   --   HEPARINUNFRC  --   --   --   --  0.40 0.33  CREATININE 1.67*  --  1.38*  --   --   --     Estimated Creatinine Clearance: 29.8 mL/min (by C-G formula based on Cr of 1.38).   Medical History: Past Medical History  Diagnosis Date  . Cardiac arrest - ventricular fibrillation 12/10    with successful resucitation, S/p ICD  . ICD (implantable cardiac defibrillator) in place     she has received appropriate therapy for VF  . Atrial fibrillation or flutter   . Osteopenia   . HTN (hypertension)     moderate  . Nonischemic cardiomyopathy ([place])     followed by Dr April Holding at [facility]  . CHF (congestive heart failure) ([place])   . Anemia   . Plasma cell disorder 03/20/2012  . Plasma cell disorder 03/20/2012  . Sessile colonic polyp 2011    Dr Benson Norway  . Diverticula of colon 2011  . Internal and external hemorrhoids without complication [id number]  . AICD (automatic cardioverter/defibrillator) present    Assessment: Ms Perrier is a 81 year old female with a PMH of HF due to severe NICM, chronic systolic HF, PAF, plasma cell disorder (Likely IgA MGUS) - followed by Dr. [name] (follwoed q 6 months). Underwent implantation of the HeartMate II LVAD on 04/06/13 for DT.  Patient being admitted for Lap chole with likely  c-drain removal on 2/2.   INR down to 1.7, heparin started this am. Initial level at goal on 900 units/hr and still at goal at 0.33 but borderline low. No bleeding issues noted.  Goal of Therapy:  INR 2-3 Heparin level 0.3-0.7 units/ml Monitor platelets by anticoagulation protocol: Yes   Plan:  Increase heparin gtt slightly to 1,000 units/hr Monitor daily HL, CBC, s/s of bleed  Elenor Quinones, PharmD, [facility] Clinical Pharmacist Pager [phone] 10/04/2015 8:26 PM

## 2015-10-05 ENCOUNTER — Inpatient Hospital Stay (HOSPITAL_COMMUNITY): Payer: Medicare Other

## 2015-10-05 LAB — CBC
HCT: 33.3 % — ABNORMAL LOW (ref 36.0–46.0)
Hemoglobin: 10.7 g/dL — ABNORMAL LOW (ref 12.0–15.0)
MCH: 28 pg (ref 26.0–34.0)
MCHC: 32.1 g/dL (ref 30.0–36.0)
MCV: 87.2 fL (ref 78.0–100.0)
Platelets: 177 10*3/uL (ref 150–400)
RBC: 3.82 MIL/uL — ABNORMAL LOW (ref 3.87–5.11)
RDW: 22.5 % — ABNORMAL HIGH (ref 11.5–15.5)
WBC: 5.6 10*3/uL (ref 4.0–10.5)

## 2015-10-05 LAB — BASIC METABOLIC PANEL
Anion gap: 7 (ref 5–15)
BUN: 24 mg/dL — ABNORMAL HIGH (ref 6–20)
CO2: 20 mmol/L — ABNORMAL LOW (ref 22–32)
Calcium: 9 mg/dL (ref 8.9–10.3)
Chloride: 110 mmol/L (ref 101–111)
Creatinine, Ser: 1.3 mg/dL — ABNORMAL HIGH (ref 0.44–1.00)
GFR calc Af Amer: 44 mL/min — ABNORMAL LOW (ref >60)
GFR calc non Af Amer: 38 mL/min — ABNORMAL LOW (ref >60)
Glucose, Bld: 86 mg/dL (ref 65–99)
Potassium: 4.3 mmol/L (ref 3.5–5.1)
Sodium: 137 mmol/L (ref 135–145)

## 2015-10-05 LAB — PROTIME-INR
INR: 1.58 — ABNORMAL HIGH (ref 0.00–1.49)
Prothrombin Time: 18.9 seconds — ABNORMAL HIGH (ref 11.6–15.2)

## 2015-10-05 LAB — HEPARIN LEVEL (UNFRACTIONATED)
Heparin Unfractionated: 0.31 IU/mL (ref 0.30–0.70)
Heparin Unfractionated: 0.26 IU/mL — ABNORMAL LOW (ref 0.30–0.70)

## 2015-10-05 LAB — SURGICAL PCR SCREEN
MRSA, PCR: NEGATIVE
Staphylococcus aureus: NEGATIVE

## 2015-10-05 LAB — LACTATE DEHYDROGENASE: LDH: 192 U/L (ref 98–192)

## 2015-10-05 MED ORDER — LIDOCAINE HCL (CARDIAC) 20 MG/ML IV SOLN
INTRAVENOUS | Status: AC
  Filled 2015-10-05: qty 5

## 2015-10-05 MED ORDER — EPHEDRINE SULFATE 50 MG/ML IJ SOLN
INTRAMUSCULAR | Status: AC
  Filled 2015-10-05: qty 1

## 2015-10-05 MED ORDER — PROPOFOL 10 MG/ML IV BOLUS
INTRAVENOUS | Status: AC
  Filled 2015-10-05: qty 20

## 2015-10-05 MED ORDER — MIDAZOLAM HCL 2 MG/2ML IJ SOLN
INTRAMUSCULAR | Status: AC
  Filled 2015-10-05: qty 2

## 2015-10-05 MED ORDER — STERILE WATER FOR INJECTION IJ SOLN
INTRAMUSCULAR | Status: AC
  Filled 2015-10-05: qty 10

## 2015-10-05 MED ORDER — ONDANSETRON HCL 4 MG/2ML IJ SOLN
INTRAMUSCULAR | Status: AC
  Filled 2015-10-05: qty 2

## 2015-10-05 MED ORDER — SODIUM CHLORIDE 0.9 % IV BOLUS (SEPSIS)
500.0000 mL | Freq: Once | INTRAVENOUS | Status: AC
  Administered 2015-10-05: 500 mL via INTRAVENOUS

## 2015-10-05 MED ORDER — HEPARIN (PORCINE) IN NACL 100-0.45 UNIT/ML-% IJ SOLN
1100.0000 [IU]/h | INTRAMUSCULAR | Status: DC
  Administered 2015-10-05: 1000 [IU]/h via INTRAVENOUS
  Administered 2015-10-06 – 2015-10-07 (×2): 1100 [IU]/h via INTRAVENOUS
  Filled 2015-10-05 (×5): qty 250

## 2015-10-05 MED ORDER — ROCURONIUM BROMIDE 50 MG/5ML IV SOLN
INTRAVENOUS | Status: AC
  Filled 2015-10-05: qty 1

## 2015-10-05 MED ORDER — FENTANYL CITRATE (PF) 250 MCG/5ML IJ SOLN
INTRAMUSCULAR | Status: AC
  Filled 2015-10-05: qty 5

## 2015-10-05 MED ORDER — DEXAMETHASONE SODIUM PHOSPHATE 4 MG/ML IJ SOLN
INTRAMUSCULAR | Status: AC
  Filled 2015-10-05: qty 2

## 2015-10-05 MED ORDER — HEPARIN (PORCINE) IN NACL 100-0.45 UNIT/ML-% IJ SOLN: 1000.0000 [IU]/h | INTRAMUSCULAR | Status: DC

## 2015-10-05 NOTE — Progress Notes (Signed)
HeartMate 2 Rounding Note  Subjective:    Lap Choley postponed with INR 1.58 (Needs to be < 1.5 for procedure). With lack of scheduling moved to Monday, 10/09/15.  Back on heparin.  Feels fine.  Had several low flow events yesterday in the morning.  Denies SOB, CP, lightheadedness, or dizziness.   LVAD INTERROGATION:  HeartMate II LVAD:  Flow 3.8 liters/min, speed 9200, power 4.8, PI 7.1 12 PI events, 2 low flow events yesterday around 0900.  Objective:    Vital Signs:   Temp:  [97.5 F (36.4 C)-98.7 F (37.1 C)] 98.4 F (36.9 C) (02/02 0359) Pulse Rate:  [70-77] 71 (02/02 0400) Resp:  [16-18] 18 (02/01 1652) BP: (82-124)/(51-75) 102/75 mmHg (02/02 0400) SpO2:  [97 %-100 %] 100 % (02/02 0400) Weight:  [164 lb (74.39 kg)] 164 lb (74.39 kg) (02/02 0359) Last BM Date: 10/03/15 Mean arterial Pressure 70-80s  Intake/Output:   Intake/Output Summary (Last 24 hours) at 10/05/15 0926 Last data filed at 10/05/15 0259  Gross per 24 hour  Intake 727.76 ml  Output    500 ml  Net 227.76 ml     Physical Exam: GENERAL: Elderly appearing female, no resp distress HEENT: normal  NECK: Supple, JVP 6-7, 2+ bilaterally, no bruits. No thyromegaly or nodule note.  CARDIAC: LVAD hum present.  LUNGS: Clear, normal effort. ABDOMEN: Soft, NT, ND, no HSM. No bruits or masses. +BS RUQ drain with minimal fluid in bag LVAD exit site: Dressing dry and intact. No erythema or drainage. Stabilization device present and accurately applied. Driveline dressing changed weekly. EXTREMITIES: Warm and dry, no cyanosis, clubbing, rash, edema  NEUROLOGIC: Alert and oriented x 4. Gait steady. No aphasia. No dysarthria. Affect pleasant.   Telemetry: Reviewed personally, NSR 70-80s  Labs: Basic Metabolic Panel:  Recent Labs Lab 09/28/15 1112 10/03/15 1344 10/04/15 0252 10/05/15 0300  NA 138 140 138 137  K 5.1 5.0 4.6 4.3  CL 111 108 110 110  CO2 20* 23 20* 20*  GLUCOSE 90 94 90  86  BUN 21* 25* 25* 24*  CREATININE 1.55* 1.67* 1.38* 1.30*  CALCIUM 9.5 9.5 9.1 9.0  MG  --  2.0  --   --     Liver Function Tests:  Recent Labs Lab 10/03/15 1344  AST 28  ALT 24  ALKPHOS 83  BILITOT 0.5  PROT 8.5*  ALBUMIN 3.4*   No results for input(s): LIPASE, AMYLASE in the last 168 hours. No results for input(s): AMMONIA in the last 168 hours.  CBC:  Recent Labs Lab 09/28/15 1112 10/04/15 0817 10/05/15 0300  WBC 6.1 6.4 5.6  HGB 10.8* 11.1* 10.7*  HCT 34.4* 34.7* 33.3*  MCV 88.7 88.3 87.2  PLT 199 194 177    INR:  Recent Labs Lab 09/28/15 1112 10/03/15 1344 10/03/15 1910 10/04/15 0252 10/05/15 0300  INR 2.55* 1.91* 1.93* 1.77* 1.58*    Other results:  Imaging: Dg Abd Portable 1v  10/04/2015  CLINICAL DATA:  81 year old female undergoing preoperative evaluation prior to surgery EXAM: PORTABLE ABDOMEN - 1 VIEW COMPARISON:  None. FINDINGS: Left ventricular assist device present. Percutaneous drainage catheter in the right upper quadrant consistent with known cholecystostomy tube. No radiopaque gallstones identified. The bowel gas pattern is not obstructed. No acute osseous abnormality. IMPRESSION: Unremarkable, nonobstructed bowel gas pattern. Right upper quadrant percutaneous cholecystostomy tube. Electronically Signed   By: Jacqulynn Cadet M.D.   On: 10/04/2015 15:54     Medications:     Scheduled Medications: .  amiodarone  100 mg Oral Daily  . carvedilol  6.25 mg Oral BID WC  . cholecalciferol  1,000 Units Oral Daily  . ciprofloxacin  500 mg Oral BID  . docusate sodium  100 mg Oral BID  . ferrous sulfate  325 mg Oral BID WC  . gabapentin  100 mg Oral QHS  . hydrALAZINE  50 mg Oral TID  . losartan  50 mg Oral Daily  . pantoprazole  40 mg Oral Daily  . sodium chloride  500 mL Intravenous Once  . spironolactone  25 mg Oral Daily    Infusions: . heparin 1,000 Units/hr (10/05/15 0915)    PRN Medications: traMADol   Assessment:   1.  Chronic cholecystitis 2. Chronic systolic HF s/p LVAD HMII implantation. 3. PAF 4. HTN  Plan/Discussion:    INR 1.93 => 1.77 => 1.58 Still too high for surgery. Back on heparin, lap chole postponed until Monday, 10/09/15.   Continue amio 100 mg daily for her PAF.  MAPs stable so far in 70-80s. Continue losartan and coreg.   She had too low flow alarms 10/04/15. Will give 500 cc NS and check CXR PA/Lateral. She has not felt dizzy or lightheaded. No symptoms.   Disposition based on surgeon recommendations s/p surgery monday + therapeutic INR.   I reviewed the LVAD parameters from today, and compared the results to the patient's prior recorded data.  No programming changes were made.  The LVAD is functioning within specified parameters.  The patient performs LVAD self-test daily.  LVAD interrogation was negative for any significant power changes, alarms or PI events/speed drops.  LVAD equipment check completed and is in good working order.  Back-up equipment present.   LVAD education done on emergency procedures and precautions and reviewed exit site care.  Length of Stay: 2  Annamaria Helling 10/05/2015, 9:26 AM  VAD Team --- VAD ISSUES ONLY--- Pager [phone] (7am - 7am)  Advanced Heart Failure Team  Pager [phone] (M-F; 7a - 4p)  Please contact [facility] for night-coverage after hours (4p -7a ) and weekends on [email or web address]   Patient seen and examined with Oda Kilts, PA-C. We discussed all aspects of the encounter. I agree with the assessment and plan as stated above.   Lap chole postponed due to high INR. Will reschedule for Monday. Had several low flows this am. Improved with IVF. VAD parameters now improved. Continue to hold warfarin. Continue IV heparin. CXR ok (viewed personally).   Svara Twyman,MD 2:54 PM

## 2015-10-05 NOTE — Progress Notes (Signed)
Patient ID: Brianna Hanson, female   DOB: [birth year], 81 y.o.   MRN: [id number] Doing well this am but inr still 1.58.  I think this is best delayed and cancelled case today.  She has cholecystostomy tube in place and cholecystitis so I suspect this will be a little more difficult of a gallbladder also.  I have discussed with Dr Haroldine Laws and will plan to do case on Monday.

## 2015-10-05 NOTE — Progress Notes (Addendum)
Patient having short runs of NSVT.  Longest run approximately 12 beats.  Patient asymptomatic. Notified Bhagat PA, no new orders, will continue to monitor.  Charleen Kirks Nursing Student  [phone]: Patient having more frequent SVT runs up to 150's and only lasting a few seconds. Patient states she can "feel her heart beating fast" but otherwise asymptomatic. BP stable. Bhagat, PA aware.  Dennison Mascot

## 2015-10-05 NOTE — Clinical Documentation Improvement (Signed)
Cardiology  Abnormal Lab/Test Results:               Bun     Creat      GFR: 02/02:   24        1.30        44 02/01:   25        1.38        41 01/31:   25        1.67        33   Possible Clinical Conditions associated with below indicators  CKD, stage II,  GFR 60-89  CKD, stage III,  GFR 30-59  CKD, stage IV, GFR 15-29  Other Condition  Cannot Clinically Determine   Please exercise your independent, professional judgment when responding. A specific answer is not anticipated or expected.   Thank You,  [place] [phone]

## 2015-10-05 NOTE — Progress Notes (Addendum)
ANTICOAGULATION CONSULT NOTE   Pharmacy Consult for heparin Indication: Heparin bridge with LVAD  No Known Allergies  Patient Measurements: Height: 5' (152.4 cm) Weight: 164 lb (74.39 kg) IBW/kg (Calculated) : 45.5 Heparin Dosing Weight: 60kg  Vital Signs: Temp: 97.5 F (36.4 C) (02/02 1128) Temp Source: Oral (02/02 1128) BP: 90/56 mmHg (02/02 1023) Pulse Rate: 71 (02/02 0400)  Labs:  Recent Labs  10/03/15 1344 10/03/15 1910 10/04/15 0252 10/04/15 0817 10/04/15 1201 10/04/15 1932 10/05/15 0300  HGB  --   --   --  11.1*  --   --  10.7*  HCT  --   --   --  34.7*  --   --  33.3*  PLT  --   --   --  194  --   --  177  LABPROT 21.8* 22.0* 20.6*  --   --   --  18.9*  INR 1.91* 1.93* 1.77*  --   --   --  1.58*  HEPARINUNFRC  --   --   --   --  0.40 0.33 0.31  CREATININE 1.67*  --  1.38*  --   --   --  1.30*    Estimated Creatinine Clearance: 32.1 mL/min (by C-G formula based on Cr of 1.3).   Medical History: Past Medical History  Diagnosis Date  . Cardiac arrest - ventricular fibrillation 12/10    with successful resucitation, S/p ICD  . ICD (implantable cardiac defibrillator) in place     she has received appropriate therapy for VF  . Atrial fibrillation or flutter   . Osteopenia   . HTN (hypertension)     moderate  . Nonischemic cardiomyopathy ([place])     followed by Dr April Holding at [facility]  . CHF (congestive heart failure) ([place])   . Anemia   . Plasma cell disorder 03/20/2012  . Plasma cell disorder 03/20/2012  . Sessile colonic polyp 2011    Dr Benson Norway  . Diverticula of colon 2011  . Internal and external hemorrhoids without complication [id number]  . AICD (automatic cardioverter/defibrillator) present    Assessment: Brianna Hanson is a 81 year old female with a PMH of HF due to severe NICM, chronic systolic HF, PAF, plasma cell disorder (Likely IgA MGUS) - followed by Dr. [name] (follwoed q 6 months). Underwent implantation of the HeartMate II LVAD on 04/06/13 for  DT.  Patient being admitted for Lap chole with likely c-drain removal on 2/2.   INR down to 1.58, but procedure on hold until INR < 1.5 (scheduled for Monday).  Heparin level therapeutic this AM at 0.31, but trending down.  Heparin turned off at 0300 in anticipation of procedure, but then resumed at 915 after cancellation.  No bleeding or complications noted.  Goal of Therapy:  INR 2-3 Heparin level 0.3-0.7 units/ml Monitor platelets by anticoagulation protocol: Yes   Plan:  1. Continue IV heparin at current rate of 1000 units/hr. 2. Confirm heparin level at 1700 PM. 3. Daily heparin level, CBC, and INR.  Brianna Hanson, BCPS  Clinical Pharmacist Pager [phone]  10/05/2015 1:09 PM    Addendum: confirmatory HL slightly low at 0.26.   Plan: increase heparin drip to 1100 units/hr and check AM HL.  Brianna Hanson, Pharm.D. BP:[phone] 10/05/2015 7:45 PM

## 2015-10-06 DIAGNOSIS — I472 Ventricular tachycardia: Secondary | ICD-10-CM

## 2015-10-06 LAB — BASIC METABOLIC PANEL
Anion gap: 8 (ref 5–15)
BUN: 19 mg/dL (ref 6–20)
CO2: 23 mmol/L (ref 22–32)
Calcium: 9.1 mg/dL (ref 8.9–10.3)
Chloride: 108 mmol/L (ref 101–111)
Creatinine, Ser: 1.29 mg/dL — ABNORMAL HIGH (ref 0.44–1.00)
GFR calc Af Amer: 45 mL/min — ABNORMAL LOW (ref >60)
GFR calc non Af Amer: 39 mL/min — ABNORMAL LOW (ref >60)
Glucose, Bld: 96 mg/dL (ref 65–99)
Potassium: 4.5 mmol/L (ref 3.5–5.1)
Sodium: 139 mmol/L (ref 135–145)

## 2015-10-06 LAB — CBC
HCT: 30.7 % — ABNORMAL LOW (ref 36.0–46.0)
Hemoglobin: 10 g/dL — ABNORMAL LOW (ref 12.0–15.0)
MCH: 28.4 pg (ref 26.0–34.0)
MCHC: 32.6 g/dL (ref 30.0–36.0)
MCV: 87.2 fL (ref 78.0–100.0)
Platelets: 159 10*3/uL (ref 150–400)
RBC: 3.52 MIL/uL — ABNORMAL LOW (ref 3.87–5.11)
RDW: 22.3 % — ABNORMAL HIGH (ref 11.5–15.5)
WBC: 6.3 10*3/uL (ref 4.0–10.5)

## 2015-10-06 LAB — LACTATE DEHYDROGENASE: LDH: 198 U/L — ABNORMAL HIGH (ref 98–192)

## 2015-10-06 LAB — PROTIME-INR
INR: 1.44 (ref 0.00–1.49)
Prothrombin Time: 17.7 seconds — ABNORMAL HIGH (ref 11.6–15.2)

## 2015-10-06 LAB — HEPARIN LEVEL (UNFRACTIONATED): Heparin Unfractionated: 0.48 IU/mL (ref 0.30–0.70)

## 2015-10-06 LAB — MAGNESIUM: Magnesium: 1.9 mg/dL (ref 1.7–2.4)

## 2015-10-06 MED ORDER — SODIUM CHLORIDE 0.9 % IV BOLUS (SEPSIS)
500.0000 mL | Freq: Once | INTRAVENOUS | Status: AC
  Administered 2015-10-06: 500 mL via INTRAVENOUS

## 2015-10-06 MED ORDER — MAGNESIUM SULFATE 2 GM/50ML IV SOLN
2.0000 g | Freq: Once | INTRAVENOUS | Status: AC
  Administered 2015-10-06: 2 g via INTRAVENOUS
  Filled 2015-10-06: qty 50

## 2015-10-06 NOTE — Progress Notes (Signed)
[place] for heparin Indication: Heparin bridge with LVAD  No Known Allergies  Patient Measurements: Height: 5' (152.4 cm) Weight: 159 lb 9.8 oz (72.4 kg) IBW/kg (Calculated) : 45.5 Heparin Dosing Weight: 60kg  Vital Signs: Temp: 97.8 F (36.6 C) (02/03 0749) Temp Source: Oral (02/03 0749)  Labs:  Recent Labs  10/04/15 0252  10/04/15 0817  10/05/15 0300 10/05/15 1659 10/06/15 0300  HGB  --   < > 11.1*  --  10.7*  --  10.0*  HCT  --   --  34.7*  --  33.3*  --  30.7*  PLT  --   --  194  --  177  --  159  LABPROT 20.6*  --   --   --  18.9*  --  17.7*  INR 1.77*  --   --   --  1.58*  --  1.44  HEPARINUNFRC  --   --   --   < > 0.31 0.26* 0.48  CREATININE 1.38*  --   --   --  1.30*  --  1.29*  < > = values in this interval not displayed.  Estimated Creatinine Clearance: 31.9 mL/min (by C-G formula based on Cr of 1.29).   Medical History: Past Medical History  Diagnosis Date  . Cardiac arrest - ventricular fibrillation 12/10    with successful resucitation, S/p ICD  . ICD (implantable cardiac defibrillator) in place     she has received appropriate therapy for VF  . Atrial fibrillation or flutter   . Osteopenia   . HTN (hypertension)     moderate  . Nonischemic cardiomyopathy ([place])     followed by Dr April Holding at [facility]  . CHF (congestive heart failure) ([place])   . Anemia   . Plasma cell disorder 03/20/2012  . Plasma cell disorder 03/20/2012  . Sessile colonic polyp 2011    Dr Benson [place]  . Diverticula of colon 2011  . Internal and external hemorrhoids without complication [id number]  . AICD (automatic cardioverter/defibrillator) present    Assessment: Ms Dahlman is a 81 year old female with a PMH of HF due to severe NICM, chronic systolic HF, PAF, plasma cell disorder (Likely IgA MGUS) - followed by Dr. [name] (follwoed q 6 months). Underwent implantation of the HeartMate II LVAD on 04/06/13 for DT.  Patient being admitted for Lap chole  with likely c-drain removal on 2/6.   INR down to 1.44. Heparin level therapeutic this AM at 0.48 on heparin at 1100 units/hr.  No bleeding or complications noted.  Goal of Therapy:  INR 2-3 Heparin level 0.3-0.7 units/ml Monitor platelets by anticoagulation protocol: Yes   Plan:  1. Continue IV heparin at current rate of 1100 units/hr. 2. Daily heparin level, CBC, and INR.  Uvaldo Rising, BCPS  Clinical Pharmacist Pager [phone]  10/06/2015 8:00 AM

## 2015-10-06 NOTE — Progress Notes (Signed)
HeartMate 2 Rounding Note  Subjective:    Lap Choley postponed with INR 1.58 (Needs to be < 1.5 for procedure). Rescheduled for Monday, 10/09/15.  Feels good this morning. Had a further episode of "feeling her pump yesterday". Appears to be associated with NSVT on tele and told nurse she felt her "heart racing".  K stable. Will recheck Mg. Denies SOB or CP.   INR 1.44. On heparin.  Creatinine and BUN stable to improved with fluid bolus yesterday.   LVAD INTERROGATION:  HeartMate II LVAD:  Flow 3.5 liters/min, speed 9200, power 4.6, PI 7.0   9  PI events. No low flows.  Objective:    Vital Signs:   Temp:  [97.5 F (36.4 C)-98.2 F (36.8 C)] 98.2 F (36.8 C) (02/02 2000) Resp:  [17-19] 17 (02/02 1626) BP: (85-110)/(56-79) 105/79 mmHg (02/02 2000) SpO2:  [98 %-99 %] 99 % (02/02 2000) Weight:  [159 lb 9.8 oz (72.4 kg)-169 lb 3.2 oz (76.749 kg)] 159 lb 9.8 oz (72.4 kg) (02/03 0654) Last BM Date: 10/05/15 Mean arterial Pressure 80-90s, 98 past 2 checks.  Intake/Output:   Intake/Output Summary (Last 24 hours) at 10/06/15 0747 Last data filed at 10/06/15 0400  Gross per 24 hour  Intake 1644.83 ml  Output      0 ml  Net 1644.83 ml     Physical Exam: GENERAL: Elderly appearing female, NAD HEENT: normal  NECK: Supple, JVP 7-8 cm, 2+ bilaterally, no bruits. No thyromegaly or nodule note.  CARDIAC: LVAD hum present.  LUNGS: CTAB, no resp distress ABDOMEN: Soft, nontender, nondistended, no HSM. No bruits or masses. +BS RUQ drain with minimal fluid in bag LVAD exit site: Dressing dry and intact. No erythema or drainage. Stabilization device present and accurately applied. Driveline dressing changed weekly. EXTREMITIES: Warm and dry, no cyanosis, clubbing, rash, trace ankle edema  NEUROLOGIC: Alert and oriented x 4. Gait steady. No aphasia. No dysarthria. Affect pleasant.   Telemetry: Reviewed personally, NSR 70-80s with several episodes NSVT (viewed  personally)  Labs: Basic Metabolic Panel:  Recent Labs Lab 10/03/15 1344 10/04/15 0252 10/05/15 0300 10/06/15 0300  NA 140 138 137 139  K 5.0 4.6 4.3 4.5  CL 108 110 110 108  CO2 23 20* 20* 23  GLUCOSE 94 90 86 96  BUN 25* 25* 24* 19  CREATININE 1.67* 1.38* 1.30* 1.29*  CALCIUM 9.5 9.1 9.0 9.1  MG 2.0  --   --   --     Liver Function Tests:  Recent Labs Lab 10/03/15 1344  AST 28  ALT 24  ALKPHOS 83  BILITOT 0.5  PROT 8.5*  ALBUMIN 3.4*   No results for input(s): LIPASE, AMYLASE in the last 168 hours. No results for input(s): AMMONIA in the last 168 hours.  CBC:  Recent Labs Lab 10/04/15 0817 10/05/15 0300 10/06/15 0300  WBC 6.4 5.6 6.3  HGB 11.1* 10.7* 10.0*  HCT 34.7* 33.3* 30.7*  MCV 88.3 87.2 87.2  PLT 194 177 159    INR:  Recent Labs Lab 10/03/15 1344 10/03/15 1910 10/04/15 0252 10/05/15 0300 10/06/15 0300  INR 1.91* 1.93* 1.77* 1.58* 1.44    Other results:  Imaging: Dg Chest 2 View  10/05/2015  CLINICAL DATA:  Preoperative respiratory evaluation prior laparoscopic cholecystectomy. Current history of chronic systolic heart failure post left ventricular assist device implantation. EXAM: CHEST  2 VIEW COMPARISON:  08/10/2015 and earlier. FINDINGS: Sternotomy. Left ventricular assist device. Left subclavian dual lead pacing defibrillator unchanged, the lead  tips projected at the expected location the right atrial appendage and right ventricular apex. Cardiac silhouette moderately enlarged, unchanged. Mild bilateral lower lobe bronchiectasis, unchanged. Stable minimal linear scarring in the lingula. Lungs otherwise clear. Pulmonary vascularity normal. No pleural effusions. Degenerative changes involving the thoracic spine. Cholecystostomy to in the right upper quadrant of the abdomen. IMPRESSION: 1.  No acute cardiopulmonary disease. 2. Stable moderate cardiomegaly without evidence of pulmonary edema. Stable bilateral lower lobe bronchiectasis and  minimal scarring in the lingula. Electronically Signed   By: Evangeline Dakin M.D.   On: 10/05/2015 11:56   Dg Abd Portable 1v  10/04/2015  CLINICAL DATA:  81 year old female undergoing preoperative evaluation prior to surgery EXAM: PORTABLE ABDOMEN - 1 VIEW COMPARISON:  None. FINDINGS: Left ventricular assist device present. Percutaneous drainage catheter in the right upper quadrant consistent with known cholecystostomy tube. No radiopaque gallstones identified. The bowel gas pattern is not obstructed. No acute osseous abnormality. IMPRESSION: Unremarkable, nonobstructed bowel gas pattern. Right upper quadrant percutaneous cholecystostomy tube. Electronically Signed   By: Jacqulynn Cadet M.D.   On: 10/04/2015 15:54     Medications:     Scheduled Medications: . amiodarone  100 mg Oral Daily  . carvedilol  6.25 mg Oral BID WC  . cholecalciferol  1,000 Units Oral Daily  . ciprofloxacin  500 mg Oral BID  . docusate sodium  100 mg Oral BID  . ferrous sulfate  325 mg Oral BID WC  . gabapentin  100 mg Oral QHS  . hydrALAZINE  50 mg Oral TID  . losartan  50 mg Oral Daily  . pantoprazole  40 mg Oral Daily  . spironolactone  25 mg Oral Daily    Infusions: . heparin 1,100 Units/hr (10/06/15 0000)    PRN Medications: traMADol   Assessment:   1. Chronic cholecystitis 2. Chronic systolic HF s/p LVAD HMII implantation. 3. PAF 4. HTN 5. NSVT 6. Hypomagnesemia  Plan/Discussion:    INR 1.93 => 1.77 => 1.58 => 1.44. Continue heparin. Lap Choley rescheduled for Monday, 10/09/15.   Continue amio 100 mg daily for her PAF.  MAPs in 80, and up into 90s with past two checks. Continue losartan, coreg, and hydralazine. Will follow. May need to increase hydralazine.   No further low flow alarms after fluid yesterday.   Disposition based on surgeon recommendations s/p surgery monday + therapeutic INR.   I reviewed the LVAD parameters from today, and compared the results to the patient's prior  recorded data.  No programming changes were made.  The LVAD is functioning within specified parameters.  The patient performs LVAD self-test daily.  LVAD interrogation was negative for any significant power changes, alarms or PI events/speed drops.  LVAD equipment check completed and is in good working order.  Back-up equipment present.   LVAD education done on emergency procedures and precautions and reviewed exit site care.  Length of Stay: 3  Annamaria Helling 10/06/2015, 7:47 AM  VAD Team --- VAD ISSUES ONLY--- Pager [phone] (7am - 7am)  Advanced Heart Failure Team  Pager [phone] (M-F; 7a - 4p)  Please contact [facility] for night-coverage after hours (4p -7a ) and weekends on [email or web address]  Patient seen and examined with Oda Kilts, PA-C. We discussed all aspects of the encounter. I agree with the assessment and plan as stated above.   Overall doing ok. Having PI events and NSVT. Suspect volume depletion. LDH ok. Will get Ok to check. Give 500 cc NS. Hold diuretics. Supp  Mag with 2g. Remains of coumadin. INR coming down. On heparin. Hgb stable. For lap chole on Monday.   [name], Daniel,MD 11:13 AM

## 2015-10-06 NOTE — Progress Notes (Signed)
CARDIAC REHAB PHASE I   PRE:  Rate/Rhythm: 88 SR  BP:  Supine:   Sitting: 98 MAP  Standing:    SaO2: 95%RA  MODE:  Ambulation: 390 ft   POST:  Rate/Rhythm: 96  BP:  Supine:   Sitting: 96 MAP  Standing:    SaO2: 96%RA 1300-1343 Pt switched to batteries and back to power source without difficulty. Pt walked 390 ft on RA with steady gait. No safety device needed to walk. Tired by end of walk. To recliner. Tolerated well. In good spirits.   Graylon Good, RN BSN  10/06/2015 1:39 PM

## 2015-10-07 LAB — PROTIME-INR
INR: 1.3 (ref 0.00–1.49)
Prothrombin Time: 16.3 seconds — ABNORMAL HIGH (ref 11.6–15.2)

## 2015-10-07 LAB — CBC
HCT: 29.4 % — ABNORMAL LOW (ref 36.0–46.0)
Hemoglobin: 9.5 g/dL — ABNORMAL LOW (ref 12.0–15.0)
MCH: 28.4 pg (ref 26.0–34.0)
MCHC: 32.3 g/dL (ref 30.0–36.0)
MCV: 88 fL (ref 78.0–100.0)
Platelets: 152 10*3/uL (ref 150–400)
RBC: 3.34 MIL/uL — ABNORMAL LOW (ref 3.87–5.11)
RDW: 22 % — ABNORMAL HIGH (ref 11.5–15.5)
WBC: 6.5 10*3/uL (ref 4.0–10.5)

## 2015-10-07 LAB — BASIC METABOLIC PANEL
Anion gap: 9 (ref 5–15)
BUN: 18 mg/dL (ref 6–20)
CO2: 21 mmol/L — ABNORMAL LOW (ref 22–32)
Calcium: 9 mg/dL (ref 8.9–10.3)
Chloride: 108 mmol/L (ref 101–111)
Creatinine, Ser: 1.07 mg/dL — ABNORMAL HIGH (ref 0.44–1.00)
GFR calc Af Amer: 56 mL/min — ABNORMAL LOW (ref >60)
GFR calc non Af Amer: 48 mL/min — ABNORMAL LOW (ref >60)
Glucose, Bld: 85 mg/dL (ref 65–99)
Potassium: 4.5 mmol/L (ref 3.5–5.1)
Sodium: 138 mmol/L (ref 135–145)

## 2015-10-07 LAB — HEPARIN LEVEL (UNFRACTIONATED): Heparin Unfractionated: 0.64 IU/mL (ref 0.30–0.70)

## 2015-10-07 LAB — LACTATE DEHYDROGENASE: LDH: 181 U/L (ref 98–192)

## 2015-10-07 MED ORDER — MAGNESIUM OXIDE 400 (241.3 MG) MG PO TABS
400.0000 mg | ORAL_TABLET | Freq: Every day | ORAL | Status: DC
  Administered 2015-10-07 – 2015-10-17 (×10): 400 mg via ORAL
  Filled 2015-10-07 (×10): qty 1

## 2015-10-07 MED ORDER — HYDRALAZINE HCL 50 MG PO TABS
75.0000 mg | ORAL_TABLET | Freq: Three times a day (TID) | ORAL | Status: DC
  Administered 2015-10-07 – 2015-10-09 (×6): 75 mg via ORAL
  Filled 2015-10-07 (×6): qty 1

## 2015-10-07 NOTE — Progress Notes (Signed)
CARDIAC REHAB PHASE I   PRE:  Rate/Rhythm: 88  BP:   Sitting: MAP 84     SaO2: 98%RA  MODE:  Ambulation: 400 ft   POST:  Rate/Rhythm: 85  BP:   Sitting: MAP 86     SaO2: 99% RA  1424-1456 Pt ambulated 463ft with min A, IV pole and battery pack. Pt maintained a steady gait. Returned pt to recliner with VSS. Pt did very well, very likeable.    Elihue Ebert D Tanyika Barros,MS,ACSM-RCEP 10/07/2015 3:04 PM

## 2015-10-07 NOTE — Progress Notes (Signed)
Patient ID: Brianna Hanson, female   DOB: Jun 29, 1935, 81 y.o.   MRN: [id number]   HeartMate 2 Rounding Note  Subjective:    Lap cholecystectomy postponed with INR 1.58 (Needs to be < 1.5 for procedure). Rescheduled for Monday, 10/09/15.  Feels good this morning.  Walked with cardiac rehab yesterday without problems. No abdominal pain.   Telemetry showed a couple of 3 beat runs NSVT overnight, 6 beat run around 5 pm yesterday.   INR 1.3. On heparin gtt.   LVAD INTERROGATION:  HeartMate II LVAD:  Flow 4.4 liters/min, speed 9200, power 5, PI 7.1     Objective:    Vital Signs:   Temp:  [97.7 F (36.5 C)-98 F (36.7 C)] 97.7 F (36.5 C) (02/04 0400) Resp:  [18] 18 (02/03 1629) SpO2:  [97 %-98 %] 98 % (02/04 0400) Weight:  [161 lb 9.6 oz (73.301 kg)] 161 lb 9.6 oz (73.301 kg) (02/04 [id number]) Last BM Date: 10/06/15 Mean arterial Pressure 90s  Intake/Output:   Intake/Output Summary (Last 24 hours) at 10/07/15 0811 Last data filed at 10/07/15 0600  Gross per 24 hour  Intake    932 ml  Output      0 ml  Net    932 ml     Physical Exam: GENERAL: Elderly appearing female, NAD HEENT: normal  NECK: Supple, JVP 7-8 cm, 2+ bilaterally, no bruits. No thyromegaly or nodule note.  CARDIAC: LVAD hum present.  LUNGS: CTAB, no resp distress ABDOMEN: Soft, nontender, nondistended, no HSM. No bruits or masses. +BS RUQ drain with minimal fluid in bag LVAD exit site: Dressing dry and intact. No erythema or drainage. Stabilization device present and accurately applied. Driveline dressing changed weekly. EXTREMITIES: Warm and dry, no cyanosis, clubbing, rash, trace ankle edema  NEUROLOGIC: Alert and oriented x 4. Gait steady. No aphasia. No dysarthria. Affect pleasant.   Telemetry: Reviewed personally, NSR 70-80s with a couple of 3 beat runs NSVT overnight.  Labs: Basic Metabolic Panel:  Recent Labs Lab 10/03/15 1344 10/04/15 0252 10/05/15 0300 10/06/15 0300  10/07/15 0243  NA 140 138 137 139 138  K 5.0 4.6 4.3 4.5 4.5  CL 108 110 110 108 108  CO2 23 20* 20* 23 21*  GLUCOSE 94 90 86 96 85  BUN 25* 25* 24* 19 18  CREATININE 1.67* 1.38* 1.30* 1.29* 1.07*  CALCIUM 9.5 9.1 9.0 9.1 9.0  MG 2.0  --   --  1.9  --     Liver Function Tests:  Recent Labs Lab 10/03/15 1344  AST 28  ALT 24  ALKPHOS 83  BILITOT 0.5  PROT 8.5*  ALBUMIN 3.4*   No results for input(s): LIPASE, AMYLASE in the last 168 hours. No results for input(s): AMMONIA in the last 168 hours.  CBC:  Recent Labs Lab 10/04/15 0817 10/05/15 0300 10/06/15 0300 10/07/15 0243  WBC 6.4 5.6 6.3 6.5  HGB 11.1* 10.7* 10.0* 9.5*  HCT 34.7* 33.3* 30.7* 29.4*  MCV 88.3 87.2 87.2 88.0  PLT 194 177 159 152    INR:  Recent Labs Lab 10/03/15 1910 10/04/15 0252 10/05/15 0300 10/06/15 0300 10/07/15 0243  INR 1.93* 1.77* 1.58* 1.44 1.30    Other results:  Imaging: Dg Chest 2 View  10/05/2015  CLINICAL DATA:  Preoperative respiratory evaluation prior laparoscopic cholecystectomy. Current history of chronic systolic heart failure post left ventricular assist device implantation. EXAM: CHEST  2 VIEW COMPARISON:  08/10/2015 and earlier. FINDINGS: Sternotomy. Left ventricular assist device. Left  subclavian dual lead pacing defibrillator unchanged, the lead tips projected at the expected location the right atrial appendage and right ventricular apex. Cardiac silhouette moderately enlarged, unchanged. Mild bilateral lower lobe bronchiectasis, unchanged. Stable minimal linear scarring in the lingula. Lungs otherwise clear. Pulmonary vascularity normal. No pleural effusions. Degenerative changes involving the thoracic spine. Cholecystostomy to in the right upper quadrant of the abdomen. IMPRESSION: 1.  No acute cardiopulmonary disease. 2. Stable moderate cardiomegaly without evidence of pulmonary edema. Stable bilateral lower lobe bronchiectasis and minimal scarring in the lingula.  Electronically Signed   By: Evangeline Dakin M.D.   On: 10/05/2015 11:56     Medications:     Scheduled Medications: . amiodarone  100 mg Oral Daily  . carvedilol  6.25 mg Oral BID WC  . cholecalciferol  1,000 Units Oral Daily  . ciprofloxacin  500 mg Oral BID  . docusate sodium  100 mg Oral BID  . ferrous sulfate  325 mg Oral BID WC  . gabapentin  100 mg Oral QHS  . hydrALAZINE  75 mg Oral TID  . losartan  50 mg Oral Daily  . pantoprazole  40 mg Oral Daily  . spironolactone  25 mg Oral Daily    Infusions: . heparin 1,100 Units/hr (10/06/15 2000)    PRN Medications: traMADol   Assessment:   1. Chronic cholecystitis 2. Chronic systolic HF s/p LVAD HMII implantation. 3. PAF 4. HTN 5. NSVT 6. Hypomagnesemia  Plan/Discussion:    INR 1.93 => 1.77 => 1.58 => 1.44 => 1.3. Continue heparin gtt. Elective laparoscopic cholecystectomy rescheduled for Monday, 10/09/15.   Continue amio 100 mg daily for her PAF.  MAPs in 90s, increase hydralazine to 75 mg tid.   Short runs (3 beats) NSVT overnight.  Will add daily magnesium to her medication regimen.   Disposition based on surgeon recommendations s/p surgery monday + therapeutic INR.   I reviewed the LVAD parameters from today, and compared the results to the patient's prior recorded data.  No programming changes were made.  The LVAD is functioning within specified parameters.  The patient performs LVAD self-test daily.  LVAD interrogation was negative for any significant power changes, alarms or PI events/speed drops.  LVAD equipment check completed and is in good working order.  Back-up equipment present.   LVAD education done on emergency procedures and precautions and reviewed exit site care.  Length of Stay: [place] 10/07/2015, 8:11 AM  VAD Team --- VAD ISSUES ONLY--- Pager [phone] (7am - 7am)  Advanced Heart Failure Team  Pager [phone] (M-F; 7a - 4p)  Please contact [facility] for night-coverage after  hours (4p -7a ) and weekends on [email or web address]

## 2015-10-07 NOTE — Progress Notes (Signed)
ANTICOAGULATION CONSULT NOTE - Follow Up Consult  Pharmacy Consult for heparin Indication: Heparin bridge with LVAD  No Known Allergies  Patient Measurements: Height: 5' (152.4 cm) Weight: 161 lb 9.6 oz (73.301 kg) IBW/kg (Calculated) : 45.5 Heparin Dosing Weight: 61.75 kg  Vital Signs: Temp: 97.7 F (36.5 C) (02/04 0400) Temp Source: Oral (02/04 0400)  Labs:  Recent Labs  10/05/15 0300 10/05/15 1659 10/06/15 0300 10/07/15 0243  HGB 10.7*  --  10.0* 9.5*  HCT 33.3*  --  30.7* 29.4*  PLT 177  --  159 152  LABPROT 18.9*  --  17.7* 16.3*  INR 1.58*  --  1.44 1.30  HEPARINUNFRC 0.31 0.26* 0.48 0.64  CREATININE 1.30*  --  1.29* 1.07*    Estimated Creatinine Clearance: 38.7 mL/min (by C-G formula based on Cr of 1.07).   Medications:  Scheduled:  . amiodarone  100 mg Oral Daily  . carvedilol  6.25 mg Oral BID WC  . cholecalciferol  1,000 Units Oral Daily  . ciprofloxacin  500 mg Oral BID  . docusate sodium  100 mg Oral BID  . ferrous sulfate  325 mg Oral BID WC  . gabapentin  100 mg Oral QHS  . hydrALAZINE  50 mg Oral TID  . losartan  50 mg Oral Daily  . pantoprazole  40 mg Oral Daily  . spironolactone  25 mg Oral Daily   Infusions:  . heparin 1,100 Units/hr (10/06/15 2000)    Assessment: Brianna Hanson is a 81 yo F admitted 10/03/2015 for lap chole with likely C-drain removal scheduled for 2/6 (Needs INR < 1.5 for procedure) She underwent implantation of the HeartMate II LVAD on 04/06/13 for DT.  INR 1.30. Heparin level this AM at 0.64 (therapeutic). H/H stable, Plt wnl. No s/sx of bleeding noted.  Goal of Therapy:  INR 2-3 Heparin level 0.3-0.7 units/ml Monitor platelets by anticoagulation protocol: Yes   Plan:  Continue heparin at 1100 units/hr Monitor daily HL, CBC, INR and s/sx of bleeding.  Dimitri Ped, PharmD. PGY-1 Pharmacy Resident Pager: [phone] 10/07/2015,7:28 AM

## 2015-10-08 LAB — LACTATE DEHYDROGENASE: LDH: 217 U/L — ABNORMAL HIGH (ref 98–192)

## 2015-10-08 LAB — CBC
HCT: 26.9 % — ABNORMAL LOW (ref 36.0–46.0)
Hemoglobin: 8.9 g/dL — ABNORMAL LOW (ref 12.0–15.0)
MCH: 29 pg (ref 26.0–34.0)
MCHC: 33.1 g/dL (ref 30.0–36.0)
MCV: 87.6 fL (ref 78.0–100.0)
Platelets: 150 10*3/uL (ref 150–400)
RBC: 3.07 MIL/uL — ABNORMAL LOW (ref 3.87–5.11)
RDW: 22 % — ABNORMAL HIGH (ref 11.5–15.5)
WBC: 7.1 10*3/uL (ref 4.0–10.5)

## 2015-10-08 LAB — BASIC METABOLIC PANEL
Anion gap: 7 (ref 5–15)
BUN: 20 mg/dL (ref 6–20)
CO2: 21 mmol/L — ABNORMAL LOW (ref 22–32)
Calcium: 8.8 mg/dL — ABNORMAL LOW (ref 8.9–10.3)
Chloride: 110 mmol/L (ref 101–111)
Creatinine, Ser: 1.06 mg/dL — ABNORMAL HIGH (ref 0.44–1.00)
GFR calc Af Amer: 57 mL/min — ABNORMAL LOW (ref >60)
GFR calc non Af Amer: 49 mL/min — ABNORMAL LOW (ref >60)
Glucose, Bld: 87 mg/dL (ref 65–99)
Potassium: 4 mmol/L (ref 3.5–5.1)
Sodium: 138 mmol/L (ref 135–145)

## 2015-10-08 LAB — HEMOGLOBIN AND HEMATOCRIT, BLOOD
HCT: 30.6 % — ABNORMAL LOW (ref 36.0–46.0)
Hemoglobin: 9.9 g/dL — ABNORMAL LOW (ref 12.0–15.0)

## 2015-10-08 LAB — HEPARIN LEVEL (UNFRACTIONATED): Heparin Unfractionated: 0.53 IU/mL (ref 0.30–0.70)

## 2015-10-08 LAB — PROTIME-INR
INR: 1.29 (ref 0.00–1.49)
Prothrombin Time: 16.2 seconds — ABNORMAL HIGH (ref 11.6–15.2)

## 2015-10-08 NOTE — Progress Notes (Signed)
ANTICOAGULATION CONSULT NOTE - Follow Up Consult  Pharmacy Consult for heparin Indication: Heparin bridge with LVAD  No Known Allergies  Patient Measurements: Height: 5' (152.4 cm) Weight: 159 lb 11.2 oz (72.439 kg) IBW/kg (Calculated) : 45.5 Heparin Dosing Weight: 61.75 kg  Vital Signs: Temp: 98 F (36.7 C) (02/05 0338) Temp Source: Oral (02/05 0338)  Labs:  Recent Labs  10/06/15 0300 10/07/15 0243 10/08/15 0230  HGB 10.0* 9.5* 8.9*  HCT 30.7* 29.4* 26.9*  PLT 159 152 150  LABPROT 17.7* 16.3* 16.2*  INR 1.44 1.30 1.29  HEPARINUNFRC 0.48 0.64 0.53  CREATININE 1.29* 1.07* 1.06*    Estimated Creatinine Clearance: 38.9 mL/min (by C-G formula based on Cr of 1.06).   Medications:  Scheduled:  . amiodarone  100 mg Oral Daily  . carvedilol  6.25 mg Oral BID WC  . cholecalciferol  1,000 Units Oral Daily  . ciprofloxacin  500 mg Oral BID  . docusate sodium  100 mg Oral BID  . ferrous sulfate  325 mg Oral BID WC  . gabapentin  100 mg Oral QHS  . hydrALAZINE  75 mg Oral TID  . losartan  50 mg Oral Daily  . magnesium oxide  400 mg Oral Daily  . pantoprazole  40 mg Oral Daily  . spironolactone  25 mg Oral Daily   Infusions:  . heparin 1,100 Units/hr (10/08/15 0700)    Assessment: Ms. Staats is a 81 yo F admitted 10/03/2015 for lap chole with likely C-drain removal scheduled for 2/6 (Needs INR < 1.5 for procedure). She underwent implantation of the HeartMate II LVAD on 04/06/13 for DT.  INR 1.29. Heparin level this AM at 0.53 (therapeutic). H/H stable, Plt wnl. No s/sx of bleeding noted.  Goal of Therapy:  INR 2-3 Heparin level 0.3-0.7 units/ml Monitor platelets by anticoagulation protocol: Yes   Plan:  Continue heparin at 1100 units/hr Monitor daily HL, CBC, INR and s/sx of bleeding.  Dimitri Ped, PharmD. PGY-1 Pharmacy Resident Pager: [phone] 10/08/2015,8:13 AM

## 2015-10-08 NOTE — Progress Notes (Signed)
Patient ID: Brianna Hanson, female   DOB: 1935-05-02, 81 y.o.   MRN: [id number]  [facility]  Subjective: Patient up at bedside, family at bedside.  No complaints.  Anxious to proceed with surgery tomorrow.  Objective: Vital signs in last 24 hours: Temp:  [97.7 F (36.5 C)-98.2 F (36.8 C)] 97.7 F (36.5 C) (02/05 0800) Pulse Rate:  [86] 86 (02/04 1200) Resp:  [16-18] 18 (02/04 1645) BP: (73)/(51) 73/51 mmHg (02/04 1645) SpO2:  [97 %-100 %] 100 % (02/05 0800) Weight:  [72.439 kg (159 lb 11.2 oz)] 72.439 kg (159 lb 11.2 oz) (02/05 0338) Last BM Date: 10/08/15  Intake/Output from previous day: 02/04 0701 - 02/05 0700 In: 1114 [P.O.:850; I.V.:264] Out: -  Intake/Output this shift: Total I/O In: 11 [I.V.:11] Out: -   Physical Exam: HEENT - sclerae clear, mucous membranes moist Neck - soft Abdomen - soft without distension; no tenderness Ext - no edema, non-tender Neuro - alert & oriented, no focal deficits  Lab Results:   Recent Labs  10/07/15 0243 10/08/15 0230  WBC 6.5 7.1  HGB 9.5* 8.9*  HCT 29.4* 26.9*  PLT 152 150   BMET  Recent Labs  10/07/15 0243 10/08/15 0230  NA 138 138  K 4.5 4.0  CL 108 110  CO2 21* 21*  GLUCOSE 85 87  BUN 18 20  CREATININE 1.07* 1.06*  CALCIUM 9.0 8.8*   PT/INR  Recent Labs  10/07/15 0243 10/08/15 0230  LABPROT 16.3* 16.2*  INR 1.30 1.29   Comprehensive Metabolic Panel:    Component Value Date/Time   NA 138 10/08/2015 0230   NA 138 10/07/2015 0243   NA 140 02/08/2015 0923   NA 139 08/09/2014 0926   K 4.0 10/08/2015 0230   K 4.5 10/07/2015 0243   K 4.2 02/08/2015 0923   K 4.0 08/09/2014 0926   CL 110 10/08/2015 0230   CL 108 10/07/2015 0243   CL 110* 02/10/2013 1053   CL 110* 10/21/2012 0928   CO2 21* 10/08/2015 0230   CO2 21* 10/07/2015 0243   CO2 23 02/08/2015 0923   CO2 22 08/09/2014 0926   BUN 20 10/08/2015 0230   BUN 18 10/07/2015 0243   BUN 15.5 02/08/2015 0923    BUN 17.0 08/09/2014 0926   CREATININE 1.06* 10/08/2015 0230   CREATININE 1.07* 10/07/2015 0243   CREATININE 1.3* 02/08/2015 0923   CREATININE 1.4* 08/09/2014 0926   GLUCOSE 87 10/08/2015 0230   GLUCOSE 85 10/07/2015 0243   GLUCOSE 95 02/08/2015 0923   GLUCOSE 84 08/09/2014 0926   GLUCOSE 97 02/10/2013 1053   GLUCOSE 77 10/21/2012 0928   CALCIUM 8.8* 10/08/2015 0230   CALCIUM 9.0 10/07/2015 0243   CALCIUM 8.6 02/08/2015 0923   CALCIUM 8.9 08/09/2014 0926   AST 28 10/03/2015 1344   AST 26 09/13/2015 0945   AST 31 02/08/2015 0923   AST 25 08/09/2014 0926   ALT 24 10/03/2015 1344   ALT 16 09/13/2015 0945   ALT 26 02/08/2015 0923   ALT 25 08/09/2014 0926   ALKPHOS 83 10/03/2015 1344   ALKPHOS 83 09/13/2015 0945   ALKPHOS 102 02/08/2015 0923   ALKPHOS 106 08/09/2014 0926   BILITOT 0.5 10/03/2015 1344   BILITOT 0.4 09/13/2015 0945   BILITOT 0.45 02/08/2015 0923   BILITOT 0.41 08/09/2014 0926   PROT 8.5* 10/03/2015 1344   PROT 8.1 09/13/2015 0945   PROT 7.2 02/08/2015 0923   PROT 7.5 08/09/2014  [id number]   ALBUMIN 3.4* 10/03/2015 1344   ALBUMIN 3.0* 09/13/2015 0945   ALBUMIN 3.0* 02/08/2015 0923   ALBUMIN 3.1* 08/09/2014 [id number]    Studies/Results: No results found.  Assessment & Plans: Chronic cholecystitis, cholelithiasis  Anticipate lap chole tomorrow - Monday 2/6 @ 730AM  NPO after MN tonight  Will need to stop heparin drip approx 1 AM tomorrow morning if OK with cardiology  Earnstine Regal, MD, [facility] Office: [place] 10/08/2015

## 2015-10-08 NOTE — Progress Notes (Signed)
Patient's MAP at 1600 was 62 (checked 2x); pt asymptomatic.  MD notified and orders given to hold afternoon antihypertensives and recheck MAP in 1 hour.  If MAP <70, give 250 cc normal saline bolus. MAP at 1700 was 72.  Will continue to monitor. [place], Ardeth Sportsman

## 2015-10-08 NOTE — Progress Notes (Signed)
Patient ID: Brianna Hanson, female   DOB: [birth year], 81 y.o.   MRN: [id number]   HeartMate 2 Rounding Note  Subjective:     Feels well. No ab pain. No SOB. Stil with frequent PVCs and occasional brief NSVT on tele.   INR 1.29. On heparin gtt. Hgb drifting down.   LVAD INTERROGATION:  HeartMate II LVAD:  Flow 4.1 liters/min, speed 9200, power 5.0, PI 7.5     Objective:    Vital Signs:   Temp:  [97.7 F (36.5 C)-98.2 F (36.8 C)] 97.7 F (36.5 C) (02/05 0800) Pulse Rate:  [86] 86 (02/04 1200) Resp:  [16-18] 18 (02/04 1645) BP: (73)/(51) 73/51 mmHg (02/04 1645) SpO2:  [97 %-100 %] 100 % (02/05 0800) Weight:  [72.439 kg (159 lb 11.2 oz)] 72.439 kg (159 lb 11.2 oz) (02/05 0338) Last BM Date: 10/08/15 Mean arterial Pressure 90s  Intake/Output:   Intake/Output Summary (Last 24 hours) at 10/08/15 1157 Last data filed at 10/08/15 1100  Gross per 24 hour  Intake   1144 ml  Output      0 ml  Net   1144 ml     Physical Exam: GENERAL: Sitting in chair, NAD HEENT: normal  NECK: Supple, JVP 6 cm, 2+ bilaterally, no bruits. No thyromegaly or nodule note.  CARDIAC: LVAD hum present.  LUNGS: CTAB, no resp distress ABDOMEN: Soft, nontender, nondistended, no HSM. No bruits or masses. +BS RUQ drain with minimal fluid in bag LVAD exit site: Dressing dry and intact. No erythema or drainage. Stabilization device present and accurately applied. Driveline dressing changed weekly. EXTREMITIES: Warm and dry, no cyanosis, clubbing, rash, trace ankle edema  NEUROLOGIC: Alert and oriented x 4. Gait steady. No aphasia. No dysarthria. Affect pleasant.   Telemetry: Reviewed personally, NSR 80-90s with frequent PVC and occasional brief runs NSVT overnight.  Labs: Basic Metabolic Panel:  Recent Labs Lab 10/03/15 1344 10/04/15 0252 10/05/15 0300 10/06/15 0300 10/07/15 0243 10/08/15 0230  NA 140 138 137 139 138 138  K 5.0 4.6 4.3 4.5 4.5 4.0  CL 108 110 110 108 108 110    CO2 23 20* 20* 23 21* 21*  GLUCOSE 94 90 86 96 85 87  BUN 25* 25* 24* 19 18 20   CREATININE 1.67* 1.38* 1.30* 1.29* 1.07* 1.06*  CALCIUM 9.5 9.1 9.0 9.1 9.0 8.8*  MG 2.0  --   --  1.9  --   --     Liver Function Tests:  Recent Labs Lab 10/03/15 1344  AST 28  ALT 24  ALKPHOS 83  BILITOT 0.5  PROT 8.5*  ALBUMIN 3.4*   No results for input(s): LIPASE, AMYLASE in the last 168 hours. No results for input(s): AMMONIA in the last 168 hours.  CBC:  Recent Labs Lab 10/04/15 0817 10/05/15 0300 10/06/15 0300 10/07/15 0243 10/08/15 0230  WBC 6.4 5.6 6.3 6.5 7.1  HGB 11.1* 10.7* 10.0* 9.5* 8.9*  HCT 34.7* 33.3* 30.7* 29.4* 26.9*  MCV 88.3 87.2 87.2 88.0 87.6  PLT 194 177 159 152 150    INR:  Recent Labs Lab 10/04/15 0252 10/05/15 0300 10/06/15 0300 10/07/15 0243 10/08/15 0230  INR 1.77* 1.58* 1.44 1.30 1.29    Other results:  Imaging: No results found.   Medications:     Scheduled Medications: . amiodarone  100 mg Oral Daily  . carvedilol  6.25 mg Oral BID WC  . cholecalciferol  1,000 Units Oral Daily  . ciprofloxacin  500 mg Oral BID  .  docusate sodium  100 mg Oral BID  . ferrous sulfate  325 mg Oral BID WC  . gabapentin  100 mg Oral QHS  . hydrALAZINE  75 mg Oral TID  . losartan  50 mg Oral Daily  . magnesium oxide  400 mg Oral Daily  . pantoprazole  40 mg Oral Daily  . spironolactone  25 mg Oral Daily    Infusions: . heparin 1,100 Units/hr (10/08/15 0700)    PRN Medications: traMADol   Assessment:   1. Chronic cholecystitis 2. Chronic systolic HF s/p LVAD HMII implantation. 3. PAF 4. HTN 5. NSVT 6. Hypomagnesemia  Plan/Discussion:    Elective laparoscopic cholecystectomy rescheduled for tomorrow at 730a.  INR 1.29. On heparin gtt. Hg drifting down steadily. Will repeat this afternoon and get Type & Screen. May need 1uRBCs pre-op. Turn heparin off at 1a  Continue amio 100 mg daily for her PAF.  MAPs in 80s after hydralazine  increased yesterday.  Still with frequent PVCs and occasional brief NSVT. Follow K and mag.   I reviewed the LVAD parameters from today, and compared the results to the patient's prior recorded data.  No programming changes were made.  The LVAD is functioning within specified parameters.  The patient performs LVAD self-test daily.  LVAD interrogation was negative for any significant power changes, alarms or PI events/speed drops.  LVAD equipment check completed and is in good working order.  Back-up equipment present.   LVAD education done on emergency procedures and precautions and reviewed exit site care.  Length of Stay: 5  [name], Daniel MD 10/08/2015, 11:57 AM  VAD Team --- VAD ISSUES ONLY--- Pager [phone] (7am - 7am)  Advanced Heart Failure Team  Pager [phone] (M-F; 7a - 4p)  Please contact [facility] for night-coverage after hours (4p -7a ) and weekends on [email or web address]

## 2015-10-08 NOTE — Progress Notes (Signed)
Patient's Map 102 at 22:20, checked 2 times. Hydralazine given as ordered. Will recheck at midnight. Monitoring closely.

## 2015-10-09 ENCOUNTER — Encounter (HOSPITAL_COMMUNITY)
Admission: RE | Disposition: A | Discharge status: Home Health | Payer: Self-pay | Source: Ambulatory Visit | Attending: Cardiology

## 2015-10-09 ENCOUNTER — Inpatient Hospital Stay (HOSPITAL_COMMUNITY): Payer: Medicare Other | Admitting: Critical Care Medicine

## 2015-10-09 ENCOUNTER — Encounter: Payer: Medicare Other | Admitting: Internal Medicine

## 2015-10-09 DIAGNOSIS — Z9049 Acquired absence of other specified parts of digestive tract: Secondary | ICD-10-CM

## 2015-10-09 DIAGNOSIS — K8010 Calculus of gallbladder with chronic cholecystitis without obstruction: Secondary | ICD-10-CM | POA: Diagnosis not present

## 2015-10-09 HISTORY — PX: CHOLECYSTECTOMY: SHX55

## 2015-10-09 LAB — POCT I-STAT 7, (LYTES, BLD GAS, ICA,H+H)
Acid-base deficit: 4 mmol/L — ABNORMAL HIGH (ref 0.0–2.0)
Acid-base deficit: 4 mmol/L — ABNORMAL HIGH (ref 0.0–2.0)
Bicarbonate: 22.6 mEq/L (ref 20.0–24.0)
Bicarbonate: 20.9 mEq/L (ref 20.0–24.0)
Calcium, Ion: 1.21 mmol/L (ref 1.13–1.30)
Calcium, Ion: 1.2 mmol/L (ref 1.13–1.30)
HCT: 22 % — ABNORMAL LOW (ref 36.0–46.0)
HCT: 24 % — ABNORMAL LOW (ref 36.0–46.0)
Hemoglobin: 7.5 g/dL — ABNORMAL LOW (ref 12.0–15.0)
Hemoglobin: 8.2 g/dL — ABNORMAL LOW (ref 12.0–15.0)
O2 Saturation: 100 %
O2 Saturation: 100 %
Patient temperature: 37.2
Patient temperature: 35.2
Potassium: 3.7 mmol/L (ref 3.5–5.1)
Potassium: 4 mmol/L (ref 3.5–5.1)
Sodium: 141 mmol/L (ref 135–145)
Sodium: 140 mmol/L (ref 135–145)
TCO2: 24 mmol/L (ref 0–100)
TCO2: 22 mmol/L (ref 0–100)
pCO2 arterial: 50.7 mmHg — ABNORMAL HIGH (ref 35.0–45.0)
pCO2 arterial: 34.3 mmHg — ABNORMAL LOW (ref 35.0–45.0)
pH, Arterial: 7.257 — ABNORMAL LOW (ref 7.350–7.450)
pH, Arterial: 7.385 (ref 7.350–7.450)
pO2, Arterial: 460 mmHg — ABNORMAL HIGH (ref 80.0–100.0)
pO2, Arterial: 265 mmHg — ABNORMAL HIGH (ref 80.0–100.0)

## 2015-10-09 LAB — CBC
HCT: 26.7 % — ABNORMAL LOW (ref 36.0–46.0)
HCT: 26.4 % — ABNORMAL LOW (ref 36.0–46.0)
HCT: 27.9 % — ABNORMAL LOW (ref 36.0–46.0)
Hemoglobin: 8.5 g/dL — ABNORMAL LOW (ref 12.0–15.0)
Hemoglobin: 8.8 g/dL — ABNORMAL LOW (ref 12.0–15.0)
Hemoglobin: 8.8 g/dL — ABNORMAL LOW (ref 12.0–15.0)
MCH: 28.1 pg (ref 26.0–34.0)
MCH: 29 pg (ref 26.0–34.0)
MCH: 27.4 pg (ref 26.0–34.0)
MCHC: 31.8 g/dL (ref 30.0–36.0)
MCHC: 33.3 g/dL (ref 30.0–36.0)
MCHC: 31.5 g/dL (ref 30.0–36.0)
MCV: 88.4 fL (ref 78.0–100.0)
MCV: 87.1 fL (ref 78.0–100.0)
MCV: 86.9 fL (ref 78.0–100.0)
Platelets: 164 10*3/uL (ref 150–400)
Platelets: 141 10*3/uL — ABNORMAL LOW (ref 150–400)
Platelets: 163 10*3/uL (ref 150–400)
RBC: 3.02 MIL/uL — ABNORMAL LOW (ref 3.87–5.11)
RBC: 3.03 MIL/uL — ABNORMAL LOW (ref 3.87–5.11)
RBC: 3.21 MIL/uL — ABNORMAL LOW (ref 3.87–5.11)
RDW: 22 % — ABNORMAL HIGH (ref 11.5–15.5)
RDW: 20.7 % — ABNORMAL HIGH (ref 11.5–15.5)
RDW: 20.8 % — ABNORMAL HIGH (ref 11.5–15.5)
WBC: 8 10*3/uL (ref 4.0–10.5)
WBC: 10.2 10*3/uL (ref 4.0–10.5)
WBC: 9.7 10*3/uL (ref 4.0–10.5)

## 2015-10-09 LAB — POCT I-STAT, CHEM 8
BUN: 12 mg/dL (ref 6–20)
Calcium, Ion: 1.14 mmol/L (ref 1.13–1.30)
Chloride: 111 mmol/L (ref 101–111)
Creatinine, Ser: 0.8 mg/dL (ref 0.44–1.00)
Glucose, Bld: 110 mg/dL — ABNORMAL HIGH (ref 65–99)
HCT: 26 % — ABNORMAL LOW (ref 36.0–46.0)
Hemoglobin: 8.8 g/dL — ABNORMAL LOW (ref 12.0–15.0)
Potassium: 3.8 mmol/L (ref 3.5–5.1)
Sodium: 140 mmol/L (ref 135–145)
TCO2: 20 mmol/L (ref 0–100)

## 2015-10-09 LAB — PREPARE RBC (CROSSMATCH)

## 2015-10-09 LAB — LACTATE DEHYDROGENASE: LDH: 207 U/L — ABNORMAL HIGH (ref 98–192)

## 2015-10-09 LAB — PROTIME-INR
INR: 1.3 (ref 0.00–1.49)
Prothrombin Time: 16.4 seconds — ABNORMAL HIGH (ref 11.6–15.2)

## 2015-10-09 LAB — HEPARIN LEVEL (UNFRACTIONATED): Heparin Unfractionated: 0.31 IU/mL (ref 0.30–0.70)

## 2015-10-09 SURGERY — LAPAROSCOPIC CHOLECYSTECTOMY
Anesthesia: General

## 2015-10-09 SURGERY — CANCELLED PROCEDURE

## 2015-10-09 MED ORDER — EPHEDRINE SULFATE 50 MG/ML IJ SOLN
INTRAMUSCULAR | Status: DC | PRN
  Administered 2015-10-09 (×2): 5 mg via INTRAVENOUS

## 2015-10-09 MED ORDER — SUGAMMADEX SODIUM 200 MG/2ML IV SOLN
INTRAVENOUS | Status: DC | PRN
  Administered 2015-10-09: 150 mg via INTRAVENOUS

## 2015-10-09 MED ORDER — ROCURONIUM BROMIDE 100 MG/10ML IV SOLN
INTRAVENOUS | Status: DC | PRN
  Administered 2015-10-09: 10 mg via INTRAVENOUS
  Administered 2015-10-09: 40 mg via INTRAVENOUS
  Administered 2015-10-09 (×3): 10 mg via INTRAVENOUS

## 2015-10-09 MED ORDER — SUGAMMADEX SODIUM 200 MG/2ML IV SOLN
INTRAVENOUS | Status: AC
  Filled 2015-10-09: qty 2

## 2015-10-09 MED ORDER — FENTANYL CITRATE (PF) 250 MCG/5ML IJ SOLN
INTRAMUSCULAR | Status: AC
  Filled 2015-10-09: qty 5

## 2015-10-09 MED ORDER — ONDANSETRON HCL 4 MG/2ML IJ SOLN: 4.0000 mg | Freq: Four times a day (QID) | INTRAMUSCULAR | Status: DC | PRN

## 2015-10-09 MED ORDER — ALBUMIN HUMAN 5 % IV SOLN
INTRAVENOUS | Status: DC | PRN
  Administered 2015-10-09 (×3): via INTRAVENOUS

## 2015-10-09 MED ORDER — 0.9 % SODIUM CHLORIDE (POUR BTL) OPTIME
TOPICAL | Status: DC | PRN
  Administered 2015-10-09: 1000 mL

## 2015-10-09 MED ORDER — PROPOFOL 10 MG/ML IV BOLUS
INTRAVENOUS | Status: AC
  Filled 2015-10-09: qty 20

## 2015-10-09 MED ORDER — OXYCODONE HCL 5 MG/5ML PO SOLN: 5.0000 mg | Freq: Once | ORAL | Status: DC | PRN

## 2015-10-09 MED ORDER — OXYCODONE HCL 5 MG PO TABS: 5.0000 mg | ORAL_TABLET | Freq: Once | ORAL | Status: DC | PRN

## 2015-10-09 MED ORDER — CEFAZOLIN SODIUM-DEXTROSE 2-3 GM-% IV SOLR
INTRAVENOUS | Status: DC | PRN
  Administered 2015-10-09: 2 g via INTRAVENOUS

## 2015-10-09 MED ORDER — LACTATED RINGERS IV SOLN
INTRAVENOUS | Status: DC | PRN
  Administered 2015-10-09 (×2): via INTRAVENOUS

## 2015-10-09 MED ORDER — SODIUM CHLORIDE 0.9 % IR SOLN
Status: DC | PRN
  Administered 2015-10-09: 3000 mL

## 2015-10-09 MED ORDER — SODIUM CHLORIDE 0.9 % IR SOLN
Status: DC | PRN
  Administered 2015-10-09: 1000 mL

## 2015-10-09 MED ORDER — BUPIVACAINE-EPINEPHRINE 0.25% -1:200000 IJ SOLN
INTRAMUSCULAR | Status: DC | PRN
  Administered 2015-10-09: 9 mL

## 2015-10-09 MED ORDER — PHENYLEPHRINE HCL 10 MG/ML IJ SOLN
10.0000 mg | INTRAVENOUS | Status: DC | PRN
  Administered 2015-10-09: 25 ug/min via INTRAVENOUS

## 2015-10-09 MED ORDER — BUPIVACAINE-EPINEPHRINE (PF) 0.25% -1:200000 IJ SOLN
INTRAMUSCULAR | Status: AC
  Filled 2015-10-09: qty 30

## 2015-10-09 MED ORDER — PROPOFOL 10 MG/ML IV BOLUS
INTRAVENOUS | Status: DC | PRN
  Administered 2015-10-09: 100 mg via INTRAVENOUS

## 2015-10-09 MED ORDER — PHENYLEPHRINE HCL 10 MG/ML IJ SOLN
5.0000 ug/min | INTRAMUSCULAR | Status: DC
  Administered 2015-10-09: 5 ug/min via INTRAVENOUS
  Administered 2015-10-10 – 2015-10-11 (×2): 10 ug/min via INTRAVENOUS
  Filled 2015-10-09 (×3): qty 1

## 2015-10-09 MED ORDER — FENTANYL CITRATE (PF) 100 MCG/2ML IJ SOLN
INTRAMUSCULAR | Status: DC | PRN
  Administered 2015-10-09 (×2): 50 ug via INTRAVENOUS
  Administered 2015-10-09: 100 ug via INTRAVENOUS
  Administered 2015-10-09: 50 ug via INTRAVENOUS

## 2015-10-09 MED ORDER — HEMOSTATIC AGENTS (NO CHARGE) OPTIME
TOPICAL | Status: DC | PRN
  Administered 2015-10-09 (×2): 1 via TOPICAL

## 2015-10-09 MED ORDER — PIPERACILLIN-TAZOBACTAM 3.375 G IVPB
3.3750 g | Freq: Three times a day (TID) | INTRAVENOUS | Status: DC
  Administered 2015-10-09 – 2015-10-15 (×17): 3.375 g via INTRAVENOUS
  Filled 2015-10-09 (×22): qty 50

## 2015-10-09 MED ORDER — OXYCODONE-ACETAMINOPHEN 5-325 MG PO TABS: 1.0000 | ORAL_TABLET | ORAL | Status: DC | PRN

## 2015-10-09 MED ORDER — MORPHINE SULFATE (PF) 2 MG/ML IV SOLN
1.0000 mg | INTRAVENOUS | Status: DC | PRN
  Administered 2015-10-11: 1 mg via INTRAVENOUS
  Filled 2015-10-09: qty 1

## 2015-10-09 MED ORDER — HYDRALAZINE HCL 25 MG PO TABS: 25.0000 mg | ORAL_TABLET | Freq: Three times a day (TID) | ORAL | Status: DC

## 2015-10-09 MED ORDER — FENTANYL CITRATE (PF) 100 MCG/2ML IJ SOLN: 25.0000 ug | INTRAMUSCULAR | Status: DC | PRN

## 2015-10-09 SURGICAL SUPPLY — 54 items
ADH SKN CLS LQ APL DERMABOND (GAUZE/BANDAGES/DRESSINGS) ×1
APPLIER CLIP 5 13 M/L LIGAMAX5 (MISCELLANEOUS) ×3
APR CLP MED LRG 5 ANG JAW (MISCELLANEOUS) ×1
BAG SPEC RTRVL 10 TROC 200 (ENDOMECHANICALS) ×1
BLADE SURG ROTATE 9660 (MISCELLANEOUS) IMPLANT
CANISTER SUCTION 2500CC (MISCELLANEOUS) ×3 IMPLANT
CHLORAPREP W/TINT 26ML (MISCELLANEOUS) ×3 IMPLANT
CLIP APPLIE 5 13 M/L LIGAMAX5 (MISCELLANEOUS) ×1 IMPLANT
CLOSURE STERI-STRIP 1/2X4 (GAUZE/BANDAGES/DRESSINGS) ×1
CLOSURE WOUND 1/2 X4 (GAUZE/BANDAGES/DRESSINGS) ×1
CLSR STERI-STRIP ANTIMIC 1/2X4 (GAUZE/BANDAGES/DRESSINGS) ×1 IMPLANT
COVER SURGICAL LIGHT HANDLE (MISCELLANEOUS) ×3 IMPLANT
CUTTER FLEX LINEAR 45M (STAPLE) ×2 IMPLANT
DERMABOND ADHESIVE PROPEN (GAUZE/BANDAGES/DRESSINGS) ×2
DERMABOND ADVANCED .7 DNX6 (GAUZE/BANDAGES/DRESSINGS) IMPLANT
DEVICE TROCAR PUNCTURE CLOSURE (ENDOMECHANICALS) ×3 IMPLANT
ELECT REM PT RETURN 9FT ADLT (ELECTROSURGICAL) ×3
ELECTRODE REM PT RTRN 9FT ADLT (ELECTROSURGICAL) ×1 IMPLANT
EVACUATOR SILICONE 100CC (DRAIN) ×2 IMPLANT
GAUZE SPONGE 2X2 8PLY STRL LF (GAUZE/BANDAGES/DRESSINGS) IMPLANT
GLOVE BIO SURGEON STRL SZ7 (GLOVE) ×3 IMPLANT
GLOVE BIOGEL PI IND STRL 7.0 (GLOVE) IMPLANT
GLOVE BIOGEL PI IND STRL 7.5 (GLOVE) ×1 IMPLANT
GLOVE BIOGEL PI INDICATOR 7.0 (GLOVE) ×2
GLOVE BIOGEL PI INDICATOR 7.5 (GLOVE) ×4
GLOVE SURG SS PI 7.0 STRL IVOR (GLOVE) ×2 IMPLANT
GOWN STRL REUS W/ TWL LRG LVL3 (GOWN DISPOSABLE) ×3 IMPLANT
GOWN STRL REUS W/TWL LRG LVL3 (GOWN DISPOSABLE) ×9
KIT BASIN OR (CUSTOM PROCEDURE TRAY) ×3 IMPLANT
KIT ROOM TURNOVER OR (KITS) ×3 IMPLANT
LIQUID BAND (GAUZE/BANDAGES/DRESSINGS) ×3 IMPLANT
NS IRRIG 1000ML POUR BTL (IV SOLUTION) ×3 IMPLANT
PAD ARMBOARD 7.5X6 YLW CONV (MISCELLANEOUS) ×5 IMPLANT
POUCH RETRIEVAL ECOSAC 10 (ENDOMECHANICALS) ×1 IMPLANT
POUCH RETRIEVAL ECOSAC 10MM (ENDOMECHANICALS) ×2
RELOAD STAPLE 45 3.5 BLU ETS (ENDOMECHANICALS) IMPLANT
RELOAD STAPLE TA45 3.5 REG BLU (ENDOMECHANICALS) ×3 IMPLANT
SCISSORS LAP 5X35 DISP (ENDOMECHANICALS) ×3 IMPLANT
SET IRRIG TUBING LAPAROSCOPIC (IRRIGATION / IRRIGATOR) ×3 IMPLANT
SLEEVE ENDOPATH XCEL 5M (ENDOMECHANICALS) ×6 IMPLANT
SPECIMEN JAR SMALL (MISCELLANEOUS) ×3 IMPLANT
SPONGE GAUZE 2X2 STER 10/PKG (GAUZE/BANDAGES/DRESSINGS) ×2
STRIP CLOSURE SKIN 1/2X4 (GAUZE/BANDAGES/DRESSINGS) ×2 IMPLANT
SUT ETHILON 2 0 FS 18 (SUTURE) ×2 IMPLANT
SUT MNCRL AB 4-0 PS2 18 (SUTURE) ×3 IMPLANT
SUT VICRYL 0 UR6 27IN ABS (SUTURE) ×5 IMPLANT
TOWEL OR 17X24 6PK STRL BLUE (TOWEL DISPOSABLE) ×3 IMPLANT
TOWEL OR 17X26 10 PK STRL BLUE (TOWEL DISPOSABLE) ×3 IMPLANT
TRAY LAPAROSCOPIC MC (CUSTOM PROCEDURE TRAY) ×3 IMPLANT
TROCAR BALLN 12MMX100 BLUNT (TROCAR) ×2 IMPLANT
TROCAR XCEL 12X100 BLDLESS (ENDOMECHANICALS) ×2 IMPLANT
TROCAR XCEL BLUNT TIP 100MML (ENDOMECHANICALS) ×3 IMPLANT
TROCAR XCEL NON-BLD 5MMX100MML (ENDOMECHANICALS) ×3 IMPLANT
TUBING INSUFFLATION (TUBING) ×3 IMPLANT

## 2015-10-09 NOTE — Progress Notes (Signed)
Patient ID: Brianna Hanson, female   DOB: [birth year], 81 y.o.   MRN: [id number]   HeartMate 2 Rounding Note  Subjective:   S/P Lap Cholecystectomy this am. Received 1 u PRBC interoperatively for bleeding.  Denies SOB. Complaining of abdominal pain.   LVAD INTERROGATION:  HeartMate II LVAD:  Flow 4.1 liters/min, speed 9200, power 5.0, PI 6.1     Objective:    Vital Signs:   Temp:  [97.4 F (36.3 C)-98.5 F (36.9 C)] 97.4 F (36.3 C) (02/06 1137) Pulse Rate:  [67-79] 76 (02/06 1345) Resp:  [15-25] 16 (02/06 1345) SpO2:  [96 %-100 %] 100 % (02/06 1345) Arterial Line BP: (90-120)/(59-83) 106/77 mmHg (02/06 1345) Weight:  [160 lb 15 oz (73 kg)] 160 lb 15 oz (73 kg) (02/06 0500) Last BM Date: 10/09/15 Mean arterial Pressure 90s  Intake/Output:   Intake/Output Summary (Last 24 hours) at 10/09/15 1350 Last data filed at 10/09/15 1043  Gross per 24 hour  Intake   3225 ml  Output    265 ml  Net   2960 ml     Physical Exam: GENERAL: In bed.  NAD HEENT: normal  NECK: Supple, JVP 5-6 cm, 2+ bilaterally, no bruits. No thyromegaly or nodule note.  CARDIAC: LVAD hum present.  LUNGS: CTAB, no resp distress on 2 liters [place].  ABDOMEN: Tender, nondistended, no HSM. Steri strips in place from lap cholecystectomy. +BS  LVAD exit site: Dressing dry and intact. No erythema or drainage. Stabilization device present and accurately applied. Driveline dressing changed weekly. EXTREMITIES: Warm and dry, no cyanosis, clubbing, rash, trace ankle edema . RUE Art Line  NEUROLOGIC: Alert and oriented x 4. Gait steady. No aphasia. No dysarthria. Affect pleasant.   Telemetry: Reviewed personally, NSR 80-90s with frequent PVC and occasional brief runs NSVT overnight.  Labs: Basic Metabolic Panel:  Recent Labs Lab 10/03/15 1344 10/04/15 0252 10/05/15 0300 10/06/15 0300 10/07/15 0243 10/08/15 0230 10/09/15 0923 10/09/15 1024  NA 140 138 137 139 138 138 141 140  K 5.0 4.6  4.3 4.5 4.5 4.0 3.7 4.0  CL 108 110 110 108 108 110  --   --   CO2 23 20* 20* 23 21* 21*  --   --   GLUCOSE 94 90 86 96 85 87  --   --   BUN 25* 25* 24* 19 18 20   --   --   CREATININE 1.67* 1.38* 1.30* 1.29* 1.07* 1.06*  --   --   CALCIUM 9.5 9.1 9.0 9.1 9.0 8.8*  --   --   MG 2.0  --   --  1.9  --   --   --   --     Liver Function Tests:  Recent Labs Lab 10/03/15 1344  AST 28  ALT 24  ALKPHOS 83  BILITOT 0.5  PROT 8.5*  ALBUMIN 3.4*   No results for input(s): LIPASE, AMYLASE in the last 168 hours. No results for input(s): AMMONIA in the last 168 hours.  CBC:  Recent Labs Lab 10/06/15 0300 10/07/15 0243 10/08/15 0230 10/08/15 1237 10/09/15 0211 10/09/15 0923 10/09/15 1024 10/09/15 1153  WBC 6.3 6.5 7.1  --  8.0  --   --  10.2  HGB 10.0* 9.5* 8.9* 9.9* 8.5* 7.5* 8.2* 8.8*  HCT 30.7* 29.4* 26.9* 30.6* 26.7* 22.0* 24.0* 26.4*  MCV 87.2 88.0 87.6  --  88.4  --   --  87.1  PLT 159 152 150  --  164  --   --  141*    INR:  Recent Labs Lab 10/05/15 0300 10/06/15 0300 10/07/15 0243 10/08/15 0230 10/09/15 0211  INR 1.58* 1.44 1.30 1.29 1.30    Other results:  Imaging: No results found.   Medications:     Scheduled Medications: . amiodarone  100 mg Oral Daily  . carvedilol  6.25 mg Oral BID WC  . cholecalciferol  1,000 Units Oral Daily  . ciprofloxacin  500 mg Oral BID  . docusate sodium  100 mg Oral BID  . ferrous sulfate  325 mg Oral BID WC  . gabapentin  100 mg Oral QHS  . hydrALAZINE  75 mg Oral TID  . losartan  50 mg Oral Daily  . magnesium oxide  400 mg Oral Daily  . pantoprazole  40 mg Oral Daily  . spironolactone  25 mg Oral Daily    Infusions:    PRN Medications: morphine injection, oxyCODONE-acetaminophen, traMADol   Assessment:   1. Chronic cholecystitis 2. Chronic systolic HF s/p LVAD HMII implantation. 3. PAF 4. HTN 5. NSVT 6. Hypomagnesemia  Plan/Discussion:    S/P Lap Cholecystectomy- Doing well.    INR 1.3. Off  anticoagulants for now. S/P 1UPRBCs intra op.   Continue amio 100 mg daily for her PAF.  MAPs in  70-80s  Still with frequent PVCs and occasional brief NSVT. Follow K and mag.   I reviewed the LVAD parameters from today, and compared the results to the patient's prior recorded data.  No programming changes were made.  The LVAD is functioning within specified parameters.  The patient performs LVAD self-test daily.  LVAD interrogation was negative for any significant power changes, alarms or PI events/speed drops.  LVAD equipment check completed and is in good working order.  Back-up equipment present.   LVAD education done on emergency procedures and precautions and reviewed exit site care.  Length of Stay: [place] NP-C  10/09/2015, 1:50 PM  VAD Team --- VAD ISSUES ONLY--- Pager [phone] (7am - 7am)  Advanced Heart Failure Team  Pager [phone] (M-F; 7a - 4p)  Please contact [facility] for night-coverage after hours (4p -7a ) and weekends on [email or web address]  Patient seen and examined with Darrick Grinder, NP. We discussed all aspects of the encounter. I agree with the assessment and plan as stated above.   Stable post-op. Case d/w Dr. Donne Hazel. Felt to be at high risk for post-op bleeding. Will hold heparin for 48 hours. Follow closely. Place SCDs. Still with frequent PVC and occasional NSVT on monitor. kepp K >= 4.0 and Mg >=2.0. May need gentle diuresis.   Toshiro Hanken,MD 2:31 PM

## 2015-10-09 NOTE — Anesthesia Preprocedure Evaluation (Signed)
Anesthesia Evaluation  Patient identified by MRN, date of birth, ID band Patient awake    Reviewed: Allergy & Precautions, NPO status , Patient's Chart, lab work & pertinent test results  Airway Mallampati: II   Neck ROM: full    Dental   Pulmonary shortness of breath,    breath sounds clear to auscultation       Cardiovascular hypertension, +CHF  + dysrhythmias Atrial Fibrillation + Cardiac Defibrillator  Rhythm:regular Rate:Normal  LVAD.  Followed by Bensimohn   Neuro/Psych    GI/Hepatic   Endo/Other    Renal/GU      Musculoskeletal   Abdominal   Peds  Hematology  (+) Blood dyscrasia, anemia , Hb 8.5   Anesthesia Other Findings   Reproductive/Obstetrics                             Anesthesia Physical Anesthesia Plan  ASA: IV  Anesthesia Plan: General   Post-op Pain Management:    Induction: Intravenous  Airway Management Planned: Oral ETT  Additional Equipment: Arterial line  Intra-op Plan:   Post-operative Plan: Extubation in OR and Possible Post-op intubation/ventilation  Informed Consent: I have reviewed the patients History and Physical, chart, labs and discussed the procedure including the risks, benefits and alternatives for the proposed anesthesia with the patient or authorized representative who has indicated his/her understanding and acceptance.     Plan Discussed with: CRNA, Anesthesiologist and Surgeon  Anesthesia Plan Comments:         Anesthesia Quick Evaluation

## 2015-10-09 NOTE — Progress Notes (Signed)
MAP since arrival from PACU 70-90's with most recently 90's; pt given scheduled PO Hydralazine now MAP's 60's; VAD pager called to make aware; new orders for CBC to be drawn; pt sitting up in chair denies feeling dizzy or SOB; c/o some abdominal pain at this time, but denies need for pain meds; will cont. To monitor.  Brianna Hanson

## 2015-10-09 NOTE — Progress Notes (Signed)
Text message sent to VAD Coordinator on call for [place]ave[place], Neveu [phone]  Going to OR this morning. Scheduled for 0715 Laposcopic Chole. Transporter to pick patient up at 0630 this morning for OR.

## 2015-10-09 NOTE — Op Note (Signed)
Preoperative diagnosis: chronic cholecystitis, prior acute cholecystitis with cholecystostomy Postoperative diagnosis: saa Procedure: laparoscopic cholecystectomy  Surgeon: Dr Serita Grammes Anesthesia: general EBL: 200 cc One unit prbcs Drains none Specimen gb and contents to pathology Complications: none Sponge count correct at completion Disposition to recovery stable  Indications: This is a 88 yof who has vad in place. She was admitted previously and underwent cholecystostomy. She continues to have ruq pain.  In conjunction with cardiology we have discussed proceeding with lap chole.  She was admitted preoperatively and taken off coumadin. She was placed on heparin. She had cholangiogram by ir previously that showed no stones in duct and patent biliary system. We had long conversation about risks.    Procedure: After informed consent was obtained the patient was taken to the operating room. She was given antibiotics. Sequential compression devices were on her legs. She was placed under general anesthesia without complication. Her abdomen was prepped and draped in the standard sterile surgical fashion. A surgical timeout was then performed. I cut her cholecystostomy tube short.  The drive line for the vac was covered.  The line positioning had been marked by Dr Prescott Gum prior to beginning. I also had an abdominal film to show positioning up in OR.   I infiltrated marcaine below the umbilicus. I made an incision and then entered the fascia sharply. I then entered the peritoneum bluntly. She had a lot of omental adhesions from her prior low midline.  There was no evidence of an entry injury. I placed a 0 vicryl pursestring suture and inserted a hasson trocar. I then iinserted 2 further 5 mm trocars in the right abdomen.  I then lysed adhesions for about 20 minutes prior to being able to visualize everything. I then inserted another 5 mm trocar in the midline that eventually was sized up to a  12 mm trocar. The gallbladder was adherent to the duodenum and the omentum.  Eventually I was able to retract this  cephalad and lateral.  It was a difficult dissection with a lot of scarring in place.  Eventually I was able to identify the cystic duct and clearly had the critical view of safety. she had what appeared to be chronic cholecystitis with a lot of inflammation.  I then clipped the cystic duct twice distally. Due to the inflammation I did elect to divide the duct with the gia stapler above my clips so I did not spill stones.   I then clipped the cystic artery and divided it. I then removed the gallbladder from the liver bed and placed it in a bag. There was a lot of bleeding from the gallbladder fossa and I eventually controlled this with cautery and surgicel snow. It was then removed from the epigastric incision. I then obtained hemostasis and irrigated. I removed all the stones that had come out from the gallbladder. I then removed the umbilical trocar and closed with 0 vicryl and the endoclose device after tying down the pursestring. Eventually I obliterated this defect and there was no more leakage. I closed the epigastric port with 0 vicryl as well.  I then desufflated the abdomen and removed all my remaining trocars. I then closed these with 4-0 Monocryl and Dermabond. She tolerated this well was extubated and transferred to the recovery room in stable condition

## 2015-10-09 NOTE — Progress Notes (Signed)
Sign out request made to dr Marcie Bal

## 2015-10-09 NOTE — Anesthesia Procedure Notes (Signed)
Procedure Name: Intubation Date/Time: 10/09/2015 8:07 AM Performed by: Shirlyn Goltz Pre-anesthesia Checklist: Patient identified, Emergency Drugs available, Suction available and Patient being monitored Patient Re-evaluated:Patient Re-evaluated prior to inductionOxygen Delivery Method: Circle system utilized Preoxygenation: Pre-oxygenation with 100% oxygen Intubation Type: IV induction Ventilation: Mask ventilation without difficulty and Oral airway inserted - appropriate to patient size Laryngoscope Size: Mac and 3 Grade View: Grade I Tube type: Oral Tube size: 7.5 mm Number of attempts: 1 Airway Equipment and Method: Stylet Placement Confirmation: positive ETCO2,  ETT inserted through vocal cords under direct vision and breath sounds checked- equal and bilateral Secured at: 21 cm Tube secured with: Tape Dental Injury: Teeth and Oropharynx as per pre-operative assessment

## 2015-10-09 NOTE — Transfer of Care (Signed)
Immediate Anesthesia Transfer of Care Note  Patient: Brianna Hanson  Procedure(s) Performed: Procedure(s): LAPAROSCOPIC CHOLECYSTECTOMY (N/A)  Patient Location: PACU  Anesthesia Type:General  Level of Consciousness: awake, alert , oriented and patient cooperative  Airway & Oxygen Therapy: Patient Spontanous Breathing and Patient connected to face mask oxygen  Post-op Assessment: Report given to RN and Post -op Vital signs reviewed and stable  Post vital signs: Reviewed and stable  Last Vitals:  Filed Vitals:   10/08/15 1959 10/08/15 2352  BP:    Pulse:    Temp: 36.9 C 36.9 C  Resp:      Complications: No apparent anesthesia complications

## 2015-10-09 NOTE — Progress Notes (Signed)
Heparin gtt turned off as requested for surgery in morning. No bleeding noted.

## 2015-10-09 NOTE — Progress Notes (Signed)
VAD Coordinator Procedure Note:   Patient underwent laparoscopic cholecystectomy with general anesthesia. Hemodynamics and VAD parameters monitored by myself/Molly and anesthesia team throughout the procedure. Arterial line was placed for monitoring in Right Radial artery which correlated to a dopplered modified systolic BP with L brachial site.    Pre-procedure: Flow: 4.2 Speed: 9200 PI: 7.8 Power: 4.5w  Doppler Pressure:118  Arterial line:120/82 (94) with good dichrotic notch on waveform.   Sedation Induction: Flow: 2.8 Speed: 9200 PI: 5.2 Power: 4.4w  MAP 55 - 65 range w/ very little pulsatility on waveform.   Interventions/Procedure Notes:  1.5 L LR 500 cc Albumin 1 u PRBC EBL: ~350 Phenylephrine gtt & IVP Ephedrine.   During procedure patient required IVP Ephedrine per Anesthesia recommendation to maintain required MAPs. Worked very well for her in conjunction with IVF/Phenylephrine as she vasodilated with anesthesia + insufflation of abdomen + HOB angle.   Recovery area: Flow: 4.3 Speed: 9200 PI: 7.4 Power: 5.0 w  Doppler Pressure: 118 Arterial Pressure: 119/80 (94)  Patient tolerated the procedure well. PIs were 4.8 - 7.8 throughout the case with no power elevations. Occasionally noted (---) on flows, mostly in the 3 - 3.5 lpm range; rebounded well with Ephedrine + IVF. Left OR and PACU with acceptable VAD Flows and baseline PIs. No PI events during case.   Patient Disposition: In the care of receiving RN on 2S01. Report given.   Holding all anticoagulation at this point for at least 24 hours, possibly 48h. D/W Surgical & VAD Team in AM.   Janene Madeira, RN VAD Coordinator   Office: [phone] 24/7 VAD Pager: [phone]

## 2015-10-09 NOTE — Anesthesia Postprocedure Evaluation (Signed)
Anesthesia Post Note  Patient: Brianna Hanson  Procedure(s) Performed: Procedure(s) (LRB): LAPAROSCOPIC CHOLECYSTECTOMY (N/A)  Patient location during evaluation: PACU Anesthesia Type: General Level of consciousness: awake and alert and patient cooperative Pain management: pain level controlled Vital Signs Assessment: post-procedure vital signs reviewed and stable Respiratory status: spontaneous breathing and respiratory function stable Cardiovascular status: stable Anesthetic complications: no    Last Vitals:  Filed Vitals:   10/09/15 1345 10/09/15 1400  Pulse: 76 75  Temp:    Resp: 16 19    Last Pain:  Filed Vitals:   10/09/15 1428  PainSc: 0-No pain                 Armond Cuthrell S

## 2015-10-09 NOTE — Progress Notes (Signed)
Pharmacy Antibiotic Note  Brianna Hanson is a 81 y.o. female admitted on 10/03/2015 with abx s/p lab chole.  Pharmacy has been consulted for zosyn dosing.  Plan: Zosyn 3.375g IV q8h (4 hour infusion).  Height: 5' (152.4 cm) Weight: 160 lb 15 oz (73 kg) IBW/kg (Calculated) : 45.5  Temp (24hrs), Avg:98 F (36.7 C), Min:97.4 F (36.3 C), Max:98.5 F (36.9 C)   Recent Labs Lab 10/04/15 0252  10/05/15 0300 10/06/15 0300 10/07/15 0243 10/08/15 0230 10/09/15 0211 10/09/15 1153  WBC  --   < > 5.6 6.3 6.5 7.1 8.0 10.2  CREATININE 1.38*  --  1.30* 1.29* 1.07* 1.06*  --   --   < > = values in this interval not displayed.  Estimated Creatinine Clearance: 39 mL/min (by C-G formula based on Cr of 1.06).    No Known Allergies  Antimicrobials this admission: Zosyn 2/6>>  Dose adjustments this admission: None  Microbiology results: MRSA PCR neg  Thank you for allowing pharmacy to be a part of this patient's care.  Levester Fresh, PharmD, BCPS, [facility] Clinical Pharmacist Pager [phone] 10/09/2015 4:33 PM

## 2015-10-09 NOTE — Interval H&P Note (Signed)
History and Physical Interval Note:  10/09/2015 7:40 AM  Brianna Hanson  has presented today for surgery, with the diagnosis of gallstones  The various methods of treatment have been discussed with the patient and family. After consideration of risks, benefits and other options for treatment, the patient has consented to  Procedure(s): LAPAROSCOPIC CHOLECYSTECTOMY (N/A) as a surgical intervention .  [name] history has been reviewed, patient examined, no change in status, stable for surgery.  I have reviewed the patient's chart and labs.  Questions were answered to the patient's satisfaction.     Evelyna Folker

## 2015-10-10 ENCOUNTER — Encounter (HOSPITAL_COMMUNITY): Payer: Self-pay | Admitting: General Surgery

## 2015-10-10 ENCOUNTER — Inpatient Hospital Stay (HOSPITAL_COMMUNITY): Payer: Medicare Other

## 2015-10-10 DIAGNOSIS — Z9049 Acquired absence of other specified parts of digestive tract: Secondary | ICD-10-CM

## 2015-10-10 DIAGNOSIS — I9589 Other hypotension: Secondary | ICD-10-CM

## 2015-10-10 LAB — CBC
HCT: 26.1 % — ABNORMAL LOW (ref 36.0–46.0)
HCT: 27.3 % — ABNORMAL LOW (ref 36.0–46.0)
Hemoglobin: 8.2 g/dL — ABNORMAL LOW (ref 12.0–15.0)
Hemoglobin: 8.7 g/dL — ABNORMAL LOW (ref 12.0–15.0)
MCH: 27.5 pg (ref 26.0–34.0)
MCH: 28.1 pg (ref 26.0–34.0)
MCHC: 31.4 g/dL (ref 30.0–36.0)
MCHC: 31.9 g/dL (ref 30.0–36.0)
MCV: 87.6 fL (ref 78.0–100.0)
MCV: 88.1 fL (ref 78.0–100.0)
Platelets: 140 10*3/uL — ABNORMAL LOW (ref 150–400)
Platelets: 157 10*3/uL (ref 150–400)
RBC: 2.98 MIL/uL — ABNORMAL LOW (ref 3.87–5.11)
RBC: 3.1 MIL/uL — ABNORMAL LOW (ref 3.87–5.11)
RDW: 21.2 % — ABNORMAL HIGH (ref 11.5–15.5)
RDW: 21 % — ABNORMAL HIGH (ref 11.5–15.5)
WBC: 10.6 10*3/uL — ABNORMAL HIGH (ref 4.0–10.5)
WBC: 14.5 10*3/uL — ABNORMAL HIGH (ref 4.0–10.5)

## 2015-10-10 LAB — COMPREHENSIVE METABOLIC PANEL
ALT: 42 U/L (ref 14–54)
AST: 56 U/L — ABNORMAL HIGH (ref 15–41)
Albumin: 2.7 g/dL — ABNORMAL LOW (ref 3.5–5.0)
Alkaline Phosphatase: 48 U/L (ref 38–126)
Anion gap: 10 (ref 5–15)
BUN: 8 mg/dL (ref 6–20)
CO2: 23 mmol/L (ref 22–32)
Calcium: 8.4 mg/dL — ABNORMAL LOW (ref 8.9–10.3)
Chloride: 105 mmol/L (ref 101–111)
Creatinine, Ser: 0.97 mg/dL (ref 0.44–1.00)
GFR calc Af Amer: >60 mL/min (ref >60)
GFR calc non Af Amer: 55 mL/min — ABNORMAL LOW (ref >60)
Glucose, Bld: 109 mg/dL — ABNORMAL HIGH (ref 65–99)
Potassium: 3.8 mmol/L (ref 3.5–5.1)
Sodium: 138 mmol/L (ref 135–145)
Total Bilirubin: 0.9 mg/dL (ref 0.3–1.2)
Total Protein: 6.4 g/dL — ABNORMAL LOW (ref 6.5–8.1)

## 2015-10-10 LAB — PROTIME-INR
INR: 1.23 (ref 0.00–1.49)
Prothrombin Time: 15.7 seconds — ABNORMAL HIGH (ref 11.6–15.2)

## 2015-10-10 LAB — LACTATE DEHYDROGENASE: LDH: 204 U/L — ABNORMAL HIGH (ref 98–192)

## 2015-10-10 MED ORDER — MIDODRINE HCL 5 MG PO TABS
2.5000 mg | ORAL_TABLET | Freq: Three times a day (TID) | ORAL | Status: DC
  Administered 2015-10-10 (×3): 2.5 mg via ORAL
  Filled 2015-10-10 (×3): qty 1

## 2015-10-10 NOTE — Progress Notes (Signed)
Patient ID: Brianna Hanson, female   DOB: 1934/10/13, 81 y.o.   MRN: [id number]   HeartMate 2 Rounding Note  Subjective:   S/P Lap Cholecystectomy this am. Received 1 u PRBC interoperatively for bleeding. Todays hgb 8.2.  Over night started on neo for hypotension.   Complaining of abdominal pain.    LVAD INTERROGATION:  HeartMate II LVAD:  Flow 4.6 liters/min, speed 9200, power 5.4, PI 6.8     Objective:    Vital Signs:   Temp:  [97.4 F (36.3 C)-100 F (37.8 C)] 99.9 F (37.7 C) (02/07 0718) Pulse Rate:  [70-107] 100 (02/07 1100) Resp:  [15-30] 27 (02/07 1100) BP: (63-114)/(48-76) 72/58 mmHg (02/07 1000) SpO2:  [92 %-100 %] 98 % (02/07 1100) Arterial Line BP: (47-119)/(39-81) 81/63 mmHg (02/07 1100) Weight:  [166 lb 7.2 oz (75.5 kg)] 166 lb 7.2 oz (75.5 kg) (02/07 0200) Last BM Date: 10/09/15 Mean arterial Pressure 60-70s  Intake/Output:   Intake/Output Summary (Last 24 hours) at 10/10/15 1133 Last data filed at 10/10/15 1100  Gross per 24 hour  Intake 2323.51 ml  Output   1700 ml  Net 623.51 ml     Physical Exam: GENERAL: In bed.  NAD HEENT: normal  NECK: Supple, JVP 5-6 cm, 2+ bilaterally, no bruits. No thyromegaly or nodule note.  CARDIAC: LVAD hum present.  LUNGS: CTAB, no resp distress on 2 liters [place].  ABDOMEN: Tender, nondistended, no HSM. Induration noted central aspect of abdomen. Steri strips in place from lap cholecystectomy.  Sluggish BS  LVAD exit site: Dressing dry and intact. Stabilization device present and accurately applied. Driveline dressing changed weekly. EXTREMITIES: Warm and dry, no cyanosis, clubbing, rash, trace ankle edema . RUE Art Line  NEUROLOGIC: Alert and oriented x 4. Gait steady. No aphasia. No dysarthria. Affect pleasant.   Telemetry: Reviewed personally, NSR 80-90s with frequent PVC   Labs: Basic Metabolic Panel:  Recent Labs Lab 10/03/15 1344  10/05/15 0300 10/06/15 0300 10/07/15 0243 10/08/15 0230  10/09/15 0923 10/09/15 1024 10/09/15 1725 10/10/15 0510  NA 140  < > 137 139 138 138 141 140 140 138  K 5.0  < > 4.3 4.5 4.5 4.0 3.7 4.0 3.8 3.8  CL 108  < > 110 108 108 110  --   --  111 105  CO2 23  < > 20* 23 21* 21*  --   --   --  23  GLUCOSE 94  < > 86 96 85 87  --   --  110* 109*  BUN 25*  < > 24* 19 18 20   --   --  12 8  CREATININE 1.67*  < > 1.30* 1.29* 1.07* 1.06*  --   --  0.80 0.97  CALCIUM 9.5  < > 9.0 9.1 9.0 8.8*  --   --   --  8.4*  MG 2.0  --   --  1.9  --   --   --   --   --   --   < > = values in this interval not displayed.  Liver Function Tests:  Recent Labs Lab 10/03/15 1344 10/10/15 0510  AST 28 56*  ALT 24 42  ALKPHOS 83 48  BILITOT 0.5 0.9  PROT 8.5* 6.4*  ALBUMIN 3.4* 2.7*   No results for input(s): LIPASE, AMYLASE in the last 168 hours. No results for input(s): AMMONIA in the last 168 hours.  CBC:  Recent Labs Lab 10/08/15 0230  10/09/15 0211  10/09/15 1024 10/09/15 1153 10/09/15 1657 10/09/15 1725 10/10/15 0510  WBC 7.1  --  8.0  --   --  10.2 9.7  --  10.6*  HGB 8.9*  < > 8.5*  < > 8.2* 8.8* 8.8* 8.8* 8.2*  HCT 26.9*  < > 26.7*  < > 24.0* 26.4* 27.9* 26.0* 26.1*  MCV 87.6  --  88.4  --   --  87.1 86.9  --  87.6  PLT 150  --  164  --   --  141* 163  --  140*  < > = values in this interval not displayed.  INR:  Recent Labs Lab 10/05/15 0300 10/06/15 0300 10/07/15 0243 10/08/15 0230 10/09/15 0211  INR 1.58* 1.44 1.30 1.29 1.30    Other results:  Imaging: Dg Chest Port 1 View  10/10/2015  CLINICAL DATA:  Left pins radicular assist device, chronic cholecystitis status post cholecystectomy. EXAM: PORTABLE CHEST 1 VIEW COMPARISON:  Chest x-ray of October 05, 2015 FINDINGS: The lungs are less well inflated today. There is minimal subsegmental atelectasis at the lung bases. A trace of pleural fluid may be present especially on the left. The cardiac silhouette remains enlarged. The pulmonary vascularity exhibits mild cephalization.  The sternal wires are intact. The permanent pacemaker defibrillator is in stable position. The observed portions of the left ventricular assist device appear to be in appropriate position. IMPRESSION: Low lung volumes. Subsegmental atelectasis at both lung bases. Mild central pulmonary vascular congestion consistent with low-grade CHF these findings are accentuated by mild hypoinflation. The support devices appear to be in reasonable position where visualized. Electronically Signed   By: David  Martinique M.D.   On: 10/10/2015 07:28     Medications:     Scheduled Medications: . amiodarone  100 mg Oral Daily  . cholecalciferol  1,000 Units Oral Daily  . docusate sodium  100 mg Oral BID  . gabapentin  100 mg Oral QHS  . magnesium oxide  400 mg Oral Daily  . midodrine  2.5 mg Oral TID WC  . pantoprazole  40 mg Oral Daily  . piperacillin-tazobactam (ZOSYN)  IV  3.375 g Intravenous Q8H    Infusions: . phenylephrine (NEO-SYNEPHRINE) Adult infusion 10 mcg/min (10/10/15 1100)    PRN Medications: morphine injection, oxyCODONE-acetaminophen, traMADol   Assessment:   1. Chronic cholecystitis 2. Chronic systolic HF s/p LVAD HMII implantation. 3. PAF 4. HTN 5. NSVT 6. Hypomagnesemia  Plan/Discussion:    S/P Lap Cholecystectomy- Doing well.    Off anticoagulants for now. S/P 1UPRBCs intra op. Hgb today 8.2. Consider restarting heparin tomorrow.   Continue amio 100 mg daily for her PAF.  MAPs in  60-70s. Add 2.5 mg midodrine. Try to wean off neo. Once Neo off will discontinue a- line.   Still with frequent PVCs and occasional brief NSVT. Follow K and mag. K 3.8 give 40 meq K now. Check mag in am.   I reviewed the LVAD parameters from today, and compared the results to the patient's prior recorded data.  No programming changes were made.  The LVAD is functioning within specified parameters.  The patient performs LVAD self-test daily.  LVAD interrogation was negative for any significant  power changes, alarms or PI events/speed drops.  LVAD equipment check completed and is in good working order.  Back-up equipment present.   LVAD education done on emergency procedures and precautions and reviewed exit site care.  Length of Stay: [place] NP-C  10/10/2015, 11:33 AM  VAD Team --- VAD ISSUES ONLY--- Pager [phone] (7am - 7am)  Advanced Heart Failure Team  Pager [phone] (M-F; 7a - 4p)  Please contact [facility] for night-coverage after hours (4p -7a ) and weekends on [email or web address]  Patient seen and examined with Darrick Grinder, NP. We discussed all aspects of the encounter. I agree with the assessment and plan as stated above.   Belly slightly tender with some induration. Hgb stable. BP soft and has been requiring neo for BP support. Will start low-dose midodrine to try to wean. Weight up 6 pounds, will eventually need gentle diuresis but would hold off for now.   VAD parameters ok.   Lam Bjorklund,MD 3:43 PM

## 2015-10-10 NOTE — Progress Notes (Signed)
1 Day Post-Op  Subjective: No complaints pain controlled, no n/v  Objective: Vital signs in last 24 hours: Temp:  [97.4 F (36.3 C)-100 F (37.8 C)] 99.9 F (37.7 C) (02/07 0718) Pulse Rate:  [67-107] 107 (02/07 0900) Resp:  [15-30] 26 (02/07 0900) BP: (63-114)/(48-76) 72/50 mmHg (02/07 0900) SpO2:  [92 %-100 %] 100 % (02/07 0900) Arterial Line BP: (47-120)/(39-83) 63/42 mmHg (02/07 0900) Weight:  [75.5 kg (166 lb 7.2 oz)] 75.5 kg (166 lb 7.2 oz) (02/07 0200) Last BM Date: 10/09/15  Intake/Output from previous day: 02/06 0701 - 02/07 0700 In: 4484.1 [P.O.:1725; I.V.:1869.1; Blood:315; IV Piggyback:575] Out: 1950 [Urine:1700; Blood:250] Intake/Output this shift: Total I/O In: 264.4 [P.O.:240; I.V.:24.4] Out: -   GI: soft approp tender incisions clean  Lab Results:   Recent Labs  10/09/15 1657 10/09/15 1725 10/10/15 0510  WBC 9.7  --  10.6*  HGB 8.8* 8.8* 8.2*  HCT 27.9* 26.0* 26.1*  PLT 163  --  140*   BMET  Recent Labs  10/08/15 0230  10/09/15 1725 10/10/15 0510  NA 138  < > 140 138  K 4.0  < > 3.8 3.8  CL 110  --  111 105  CO2 21*  --   --  23  GLUCOSE 87  --  110* 109*  BUN 20  --  12 8  CREATININE 1.06*  --  0.80 0.97  CALCIUM 8.8*  --   --  8.4*  < > = values in this interval not displayed. PT/INR  Recent Labs  10/08/15 0230 10/09/15 0211  LABPROT 16.2* 16.4*  INR 1.29 1.30   ABG  Recent Labs  10/09/15 0923 10/09/15 1024  PHART 7.257* 7.385  HCO3 22.6 20.9    Studies/Results: Dg Chest Port 1 View  10/10/2015  CLINICAL DATA:  Left pins radicular assist device, chronic cholecystitis status post cholecystectomy. EXAM: PORTABLE CHEST 1 VIEW COMPARISON:  Chest x-ray of October 05, 2015 FINDINGS: The lungs are less well inflated today. There is minimal subsegmental atelectasis at the lung bases. A trace of pleural fluid may be present especially on the left. The cardiac silhouette remains enlarged. The pulmonary vascularity exhibits mild  cephalization. The sternal wires are intact. The permanent pacemaker defibrillator is in stable position. The observed portions of the left ventricular assist device appear to be in appropriate position. IMPRESSION: Low lung volumes. Subsegmental atelectasis at both lung bases. Mild central pulmonary vascular congestion consistent with low-grade CHF these findings are accentuated by mild hypoinflation. The support devices appear to be in reasonable position where visualized. Electronically Signed   By: David  Martinique M.D.   On: 10/10/2015 07:28    Anti-infectives: Anti-infectives    Start     Dose/Rate Route Frequency Ordered Stop   10/09/15 1800  piperacillin-tazobactam (ZOSYN) IVPB 3.375 g     3.375 g 12.5 mL/hr over 240 Minutes Intravenous Every 8 hours 10/09/15 1633     10/05/15 0600  ceFAZolin (ANCEF) IVPB 2 g/50 mL premix  Status:  Discontinued     2 g 100 mL/hr over 30 Minutes Intravenous On call to O.R. 10/04/15 1243 10/05/15 0857   10/03/15 2000  ciprofloxacin (CIPRO) tablet 500 mg  Status:  Discontinued     500 mg Oral 2 times daily 10/03/15 1345 10/09/15 1627      Assessment/Plan: POD 1 lap chole  Doing well overall Po pain meds Check cbc in am if fine can start heparin would prefer no bolus oob today, pulm toilet  [facility] 10/10/2015

## 2015-10-10 NOTE — Progress Notes (Signed)
Utilization Review Completed.  

## 2015-10-11 ENCOUNTER — Other Ambulatory Visit (HOSPITAL_COMMUNITY): Payer: Self-pay | Admitting: *Deleted

## 2015-10-11 DIAGNOSIS — K913 Postprocedural intestinal obstruction: Secondary | ICD-10-CM

## 2015-10-11 DIAGNOSIS — K567 Ileus, unspecified: Secondary | ICD-10-CM | POA: Insufficient documentation

## 2015-10-11 DIAGNOSIS — K9189 Other postprocedural complications and disorders of digestive system: Secondary | ICD-10-CM | POA: Insufficient documentation

## 2015-10-11 LAB — CBC
HCT: 24.7 % — ABNORMAL LOW (ref 36.0–46.0)
HCT: 28.7 % — ABNORMAL LOW (ref 36.0–46.0)
Hemoglobin: 7.8 g/dL — ABNORMAL LOW (ref 12.0–15.0)
Hemoglobin: 9.6 g/dL — ABNORMAL LOW (ref 12.0–15.0)
MCH: 27.9 pg (ref 26.0–34.0)
MCH: 29.4 pg (ref 26.0–34.0)
MCHC: 31.6 g/dL (ref 30.0–36.0)
MCHC: 33.4 g/dL (ref 30.0–36.0)
MCV: 88.2 fL (ref 78.0–100.0)
MCV: 87.8 fL (ref 78.0–100.0)
Platelets: 147 10*3/uL — ABNORMAL LOW (ref 150–400)
Platelets: 152 10*3/uL (ref 150–400)
RBC: 2.8 MIL/uL — ABNORMAL LOW (ref 3.87–5.11)
RBC: 3.27 MIL/uL — ABNORMAL LOW (ref 3.87–5.11)
RDW: 20.5 % — ABNORMAL HIGH (ref 11.5–15.5)
RDW: 18.7 % — ABNORMAL HIGH (ref 11.5–15.5)
WBC: 14.2 10*3/uL — ABNORMAL HIGH (ref 4.0–10.5)
WBC: 14 10*3/uL — ABNORMAL HIGH (ref 4.0–10.5)

## 2015-10-11 LAB — COMPREHENSIVE METABOLIC PANEL
ALT: 31 U/L (ref 14–54)
AST: 34 U/L (ref 15–41)
Albumin: 2.6 g/dL — ABNORMAL LOW (ref 3.5–5.0)
Alkaline Phosphatase: 46 U/L (ref 38–126)
Anion gap: 8 (ref 5–15)
BUN: 10 mg/dL (ref 6–20)
CO2: 23 mmol/L (ref 22–32)
Calcium: 8.6 mg/dL — ABNORMAL LOW (ref 8.9–10.3)
Chloride: 104 mmol/L (ref 101–111)
Creatinine, Ser: 1.11 mg/dL — ABNORMAL HIGH (ref 0.44–1.00)
GFR calc Af Amer: 54 mL/min — ABNORMAL LOW (ref >60)
GFR calc non Af Amer: 46 mL/min — ABNORMAL LOW (ref >60)
Glucose, Bld: 109 mg/dL — ABNORMAL HIGH (ref 65–99)
Potassium: 3.5 mmol/L (ref 3.5–5.1)
Sodium: 135 mmol/L (ref 135–145)
Total Bilirubin: 0.9 mg/dL (ref 0.3–1.2)
Total Protein: 6.6 g/dL (ref 6.5–8.1)

## 2015-10-11 LAB — PREPARE RBC (CROSSMATCH)

## 2015-10-11 LAB — PROTIME-INR
INR: 1.33 (ref 0.00–1.49)
Prothrombin Time: 16.6 seconds — ABNORMAL HIGH (ref 11.6–15.2)

## 2015-10-11 LAB — LACTATE DEHYDROGENASE: LDH: 207 U/L — ABNORMAL HIGH (ref 98–192)

## 2015-10-11 LAB — MAGNESIUM: Magnesium: 1.7 mg/dL (ref 1.7–2.4)

## 2015-10-11 MED ORDER — ONDANSETRON HCL 4 MG/2ML IJ SOLN
4.0000 mg | Freq: Three times a day (TID) | INTRAMUSCULAR | Status: DC | PRN
  Administered 2015-10-11 – 2015-10-12 (×2): 4 mg via INTRAVENOUS
  Filled 2015-10-11 (×2): qty 2

## 2015-10-11 MED ORDER — MIDODRINE HCL 5 MG PO TABS
5.0000 mg | ORAL_TABLET | Freq: Three times a day (TID) | ORAL | Status: DC
  Administered 2015-10-11 – 2015-10-14 (×9): 5 mg via ORAL
  Filled 2015-10-11 (×11): qty 1

## 2015-10-11 MED ORDER — POTASSIUM CHLORIDE CRYS ER 20 MEQ PO TBCR
40.0000 meq | EXTENDED_RELEASE_TABLET | Freq: Two times a day (BID) | ORAL | Status: AC
  Administered 2015-10-11 (×2): 40 meq via ORAL
  Filled 2015-10-11 (×3): qty 2

## 2015-10-11 MED ORDER — SODIUM CHLORIDE 0.9 % IV SOLN
Freq: Once | INTRAVENOUS | Status: AC
  Administered 2015-10-11: 10:00:00 via INTRAVENOUS

## 2015-10-11 NOTE — Progress Notes (Signed)
Patient ID: Brianna Hanson, female   DOB: Oct 06, 1934, 81 y.o.   MRN: [id number]   HeartMate 2 Rounding Note  Subjective:   S/P Lap Cholecystectomy this am. Received 1 u PRBC interoperatively for bleeding. Todays hgb 7.8.   Remains on Neo 10. Midodrine added.   Complaining of fatigue. Denies SOB.     LVAD INTERROGATION:  HeartMate II LVAD:  Flow 5.1 liters/min, speed 9200, power 5.5, PI 6.6 No PI events.      Objective:    Vital Signs:   Temp:  [98.7 F (37.1 C)-99.5 F (37.5 C)] 99 F (37.2 C) (02/08 0400) Pulse Rate:  [88-111] 88 (02/08 0700) Resp:  [21-39] 25 (02/08 0700) BP: (70-100)/(50-80) 90/66 mmHg (02/08 0600) SpO2:  [94 %-100 %] 95 % (02/08 0700) Arterial Line BP: (63-100)/(42-80) 84/63 mmHg (02/08 0700) Weight:  [163 lb 12.8 oz (74.3 kg)] 163 lb 12.8 oz (74.3 kg) (02/08 0600) Last BM Date: 10/09/15 Mean arterial Pressure 70s  Intake/Output:   Intake/Output Summary (Last 24 hours) at 10/11/15 0719 Last data filed at 10/11/15 0600  Gross per 24 hour  Intake 1181.88 ml  Output    850 ml  Net 331.88 ml     Physical Exam: GENERAL: In bed.  NAD HEENT: normal  NECK: Supple, JVP 5-6 cm, 2+ bilaterally, no bruits. No thyromegaly or nodule note.  CARDIAC: LVAD hum present.  LUNGS: CTAB, no resp distress on 2 liters [place].  ABDOMEN: Tender, nondistended, no HSM. Induration noted central aspect of abdomen. Steri strips in place from lap cholecystectomy.  + BS  LVAD exit site: Dressing dry and intact. Stabilization device present and accurately applied. Driveline dressing changed weekly. EXTREMITIES: Warm and dry, no cyanosis, clubbing, rash, trace ankle edema . RUE Art Line  NEUROLOGIC: Alert and oriented x 4. Gait steady. No aphasia. No dysarthria. Affect pleasant.   Telemetry: NSR 70s   Labs: Basic Metabolic Panel:  Recent Labs Lab 10/06/15 0300 10/07/15 0243 10/08/15 0230 10/09/15 0923 10/09/15 1024 10/09/15 1725 10/10/15 0510  10/11/15 0300  NA 139 138 138 141 140 140 138 135  K 4.5 4.5 4.0 3.7 4.0 3.8 3.8 3.5  CL 108 108 110  --   --  111 105 104  CO2 23 21* 21*  --   --   --  23 23  GLUCOSE 96 85 87  --   --  110* 109* 109*  BUN 19 18 20   --   --  12 8 10   CREATININE 1.29* 1.07* 1.06*  --   --  0.80 0.97 1.11*  CALCIUM 9.1 9.0 8.8*  --   --   --  8.4* 8.6*  MG 1.9  --   --   --   --   --   --   --     Liver Function Tests:  Recent Labs Lab 10/10/15 0510 10/11/15 0300  AST 56* 34  ALT 42 31  ALKPHOS 48 46  BILITOT 0.9 0.9  PROT 6.4* 6.6  ALBUMIN 2.7* 2.6*   No results for input(s): LIPASE, AMYLASE in the last 168 hours. No results for input(s): AMMONIA in the last 168 hours.  CBC:  Recent Labs Lab 10/09/15 1153 10/09/15 1657 10/09/15 1725 10/10/15 0510 10/10/15 1330 10/11/15 0300  WBC 10.2 9.7  --  10.6* 14.5* 14.2*  HGB 8.8* 8.8* 8.8* 8.2* 8.7* 7.8*  HCT 26.4* 27.9* 26.0* 26.1* 27.3* 24.7*  MCV 87.1 86.9  --  87.6 88.1 88.2  PLT 141*  163  --  140* 157 147*    INR:  Recent Labs Lab 10/07/15 0243 10/08/15 0230 10/09/15 0211 10/10/15 0510 10/11/15 0300  INR 1.30 1.29 1.30 1.23 1.33    Other results:  Imaging: Dg Chest Port 1 View  10/10/2015  CLINICAL DATA:  Left pins radicular assist device, chronic cholecystitis status post cholecystectomy. EXAM: PORTABLE CHEST 1 VIEW COMPARISON:  Chest x-ray of October 05, 2015 FINDINGS: The lungs are less well inflated today. There is minimal subsegmental atelectasis at the lung bases. A trace of pleural fluid may be present especially on the left. The cardiac silhouette remains enlarged. The pulmonary vascularity exhibits mild cephalization. The sternal wires are intact. The permanent pacemaker defibrillator is in stable position. The observed portions of the left ventricular assist device appear to be in appropriate position. IMPRESSION: Low lung volumes. Subsegmental atelectasis at both lung bases. Mild central pulmonary vascular  congestion consistent with low-grade CHF these findings are accentuated by mild hypoinflation. The support devices appear to be in reasonable position where visualized. Electronically Signed   By: David  Martinique M.D.   On: 10/10/2015 07:28     Medications:     Scheduled Medications: . amiodarone  100 mg Oral Daily  . cholecalciferol  1,000 Units Oral Daily  . docusate sodium  100 mg Oral BID  . gabapentin  100 mg Oral QHS  . magnesium oxide  400 mg Oral Daily  . midodrine  2.5 mg Oral TID WC  . pantoprazole  40 mg Oral Daily  . piperacillin-tazobactam (ZOSYN)  IV  3.375 g Intravenous Q8H    Infusions: . phenylephrine (NEO-SYNEPHRINE) Adult infusion 10 mcg/min (10/11/15 0600)    PRN Medications: morphine injection, oxyCODONE-acetaminophen, traMADol   Assessment:   1. Chronic cholecystitis 2. Chronic systolic HF s/p LVAD HMII implantation. 3. PAF 4. HTN 5. NSVT 6. Hypomagnesemia  Plan/Discussion:    S/P Lap Cholecystectomy-   Off anticoagulants for now. S/P 1UPRBCs intra op. Hgb today 7.8. Give 1UPRBCs   Continue amio 100 mg daily for her PAF.   MAPs low. Increase to 5 mg midodrine. Try to wean off neo. Once Neo off will discontinue a- line.   Still with frequent PVCs and occasional brief NSVT. K 3.5 give 40 meq x 2. Check mag.   I reviewed the LVAD parameters from today, and compared the results to the patient's prior recorded data.  No programming changes were made.  The LVAD is functioning within specified parameters.  The patient performs LVAD self-test daily.  LVAD interrogation was negative for any significant power changes, alarms or PI events/speed drops.  LVAD equipment check completed and is in good working order.  Back-up equipment present.   LVAD education done on emergency procedures and precautions and reviewed exit site care.  Length of Stay: [place] NP-C  10/11/2015, 7:19 AM  VAD Team --- VAD ISSUES ONLY--- Pager [phone] (7am - 7am)  Advanced  Heart Failure Team  Pager [phone] (M-F; 7a - 4p)  Please contact [facility] for night-coverage after hours (4p -7a ) and weekends on   Patient seen and examined with Darrick Grinder, NP. We discussed all aspects of the encounter. I agree with the assessment and plan as stated above.   Belly remains soft. Not passing gas yet.  BP remains soft. Remains on neo. Will increase midodrine. Hgb low. Will give 1u PRBCs.Consider starting low dos heparin this afternoon or early evening depending on how day goes. . (no bolus). K  low. Will supp. VAD parameters ok.   D/w Dr. Donne Hazel.   [name], Daniel,MD 8:10 AM

## 2015-10-11 NOTE — Progress Notes (Signed)
2 Days Post-Op  Subjective: No n/v tol diet  Objective: Vital signs in last 24 hours: Temp:  [98.7 F (37.1 C)-99.5 F (37.5 C)] 99 F (37.2 C) (02/08 0400) Pulse Rate:  [88-111] 88 (02/08 0700) Resp:  [21-39] 25 (02/08 0700) BP: (70-100)/(50-80) 90/66 mmHg (02/08 0600) SpO2:  [94 %-100 %] 95 % (02/08 0700) Arterial Line BP: (63-100)/(42-80) 84/63 mmHg (02/08 0700) Weight:  [74.3 kg (163 lb 12.8 oz)] 74.3 kg (163 lb 12.8 oz) (02/08 0600) Last BM Date: 10/09/15  Intake/Output from previous day: 02/07 0701 - 02/08 0700 In: 1181.9 [P.O.:780; I.V.:339.4; IV Piggyback:62.5] Out: 850 [Urine:850] Intake/Output this shift:    GI: incisions clean some bs approp tender  Lab Results:   Recent Labs  10/10/15 1330 10/11/15 0300  WBC 14.5* 14.2*  HGB 8.7* 7.8*  HCT 27.3* 24.7*  PLT 157 147*   BMET  Recent Labs  10/10/15 0510 10/11/15 0300  NA 138 135  K 3.8 3.5  CL 105 104  CO2 23 23  GLUCOSE 109* 109*  BUN 8 10  CREATININE 0.97 1.11*  CALCIUM 8.4* 8.6*   PT/INR  Recent Labs  10/10/15 0510 10/11/15 0300  LABPROT 15.7* 16.6*  INR 1.23 1.33   ABG  Recent Labs  10/09/15 0923 10/09/15 1024  PHART 7.257* 7.385  HCO3 22.6 20.9    Studies/Results: Dg Chest Port 1 View  10/10/2015  CLINICAL DATA:  Left pins radicular assist device, chronic cholecystitis status post cholecystectomy. EXAM: PORTABLE CHEST 1 VIEW COMPARISON:  Chest x-ray of October 05, 2015 FINDINGS: The lungs are less well inflated today. There is minimal subsegmental atelectasis at the lung bases. A trace of pleural fluid may be present especially on the left. The cardiac silhouette remains enlarged. The pulmonary vascularity exhibits mild cephalization. The sternal wires are intact. The permanent pacemaker defibrillator is in stable position. The observed portions of the left ventricular assist device appear to be in appropriate position. IMPRESSION: Low lung volumes. Subsegmental atelectasis at both  lung bases. Mild central pulmonary vascular congestion consistent with low-grade CHF these findings are accentuated by mild hypoinflation. The support devices appear to be in reasonable position where visualized. Electronically Signed   By: David  Martinique M.D.   On: 10/10/2015 07:28    Anti-infectives: Anti-infectives    Start     Dose/Rate Route Frequency Ordered Stop   10/09/15 1800  piperacillin-tazobactam (ZOSYN) IVPB 3.375 g     3.375 g 12.5 mL/hr over 240 Minutes Intravenous Every 8 hours 10/09/15 1633     10/05/15 0600  ceFAZolin (ANCEF) IVPB 2 g/50 mL premix  Status:  Discontinued     2 g 100 mL/hr over 30 Minutes Intravenous On call to O.R. 10/04/15 1243 10/05/15 0857   10/03/15 2000  ciprofloxacin (CIPRO) tablet 500 mg  Status:  Discontinued     500 mg Oral 2 times daily 10/03/15 1345 10/09/15 1627      Assessment/Plan: POD 2 lap chole  Advance diet as tolerated Getting a unit of blood today, can restart heparin would do with no bolus if needed today  [facility] 10/11/2015

## 2015-10-12 ENCOUNTER — Inpatient Hospital Stay (HOSPITAL_COMMUNITY): Payer: Medicare Other

## 2015-10-12 ENCOUNTER — Other Ambulatory Visit: Payer: Self-pay | Admitting: Internal Medicine

## 2015-10-12 LAB — TYPE AND SCREEN
ABO/RH(D): O POS
Antibody Screen: NEGATIVE
Unit division: 0
Unit division: 0

## 2015-10-12 LAB — LACTATE DEHYDROGENASE: LDH: 218 U/L — ABNORMAL HIGH (ref 98–192)

## 2015-10-12 LAB — COMPREHENSIVE METABOLIC PANEL
ALT: 25 U/L (ref 14–54)
AST: 23 U/L (ref 15–41)
Albumin: 2.4 g/dL — ABNORMAL LOW (ref 3.5–5.0)
Alkaline Phosphatase: 54 U/L (ref 38–126)
Anion gap: 9 (ref 5–15)
BUN: 6 mg/dL (ref 6–20)
CO2: 25 mmol/L (ref 22–32)
Calcium: 8.6 mg/dL — ABNORMAL LOW (ref 8.9–10.3)
Chloride: 104 mmol/L (ref 101–111)
Creatinine, Ser: 0.96 mg/dL (ref 0.44–1.00)
GFR calc Af Amer: >60 mL/min (ref >60)
GFR calc non Af Amer: 55 mL/min — ABNORMAL LOW (ref >60)
Glucose, Bld: 87 mg/dL (ref 65–99)
Potassium: 4.6 mmol/L (ref 3.5–5.1)
Sodium: 138 mmol/L (ref 135–145)
Total Bilirubin: 0.9 mg/dL (ref 0.3–1.2)
Total Protein: 6.4 g/dL — ABNORMAL LOW (ref 6.5–8.1)

## 2015-10-12 LAB — CBC
HCT: 28.4 % — ABNORMAL LOW (ref 36.0–46.0)
Hemoglobin: 9 g/dL — ABNORMAL LOW (ref 12.0–15.0)
MCH: 28.2 pg (ref 26.0–34.0)
MCHC: 31.7 g/dL (ref 30.0–36.0)
MCV: 89 fL (ref 78.0–100.0)
Platelets: 141 10*3/uL — ABNORMAL LOW (ref 150–400)
RBC: 3.19 MIL/uL — ABNORMAL LOW (ref 3.87–5.11)
RDW: 19 % — ABNORMAL HIGH (ref 11.5–15.5)
WBC: 11 10*3/uL — ABNORMAL HIGH (ref 4.0–10.5)

## 2015-10-12 LAB — PROTIME-INR
INR: 1.36 (ref 0.00–1.49)
Prothrombin Time: 16.9 seconds — ABNORMAL HIGH (ref 11.6–15.2)

## 2015-10-12 LAB — HEPARIN LEVEL (UNFRACTIONATED): Heparin Unfractionated: 0.12 IU/mL — ABNORMAL LOW (ref 0.30–0.70)

## 2015-10-12 MED ORDER — HEPARIN (PORCINE) IN NACL 100-0.45 UNIT/ML-% IJ SOLN
1200.0000 [IU]/h | INTRAMUSCULAR | Status: DC
  Administered 2015-10-13: 1100 [IU]/h via INTRAVENOUS
  Administered 2015-10-14 – 2015-10-16 (×3): 1200 [IU]/h via INTRAVENOUS
  Filled 2015-10-12 (×6): qty 250

## 2015-10-12 MED ORDER — HEPARIN (PORCINE) IN NACL 100-0.45 UNIT/ML-% IJ SOLN
900.0000 [IU]/h | INTRAMUSCULAR | Status: DC
  Administered 2015-10-12: 900 [IU]/h via INTRAVENOUS
  Filled 2015-10-12: qty 250

## 2015-10-12 MED ORDER — METOCLOPRAMIDE HCL 5 MG/ML IJ SOLN
5.0000 mg | Freq: Four times a day (QID) | INTRAMUSCULAR | Status: DC
  Administered 2015-10-12 – 2015-10-13 (×4): 5 mg via INTRAVENOUS
  Filled 2015-10-12 (×4): qty 2

## 2015-10-12 MED ORDER — WARFARIN - PHARMACIST DOSING INPATIENT: Freq: Every day | Status: DC

## 2015-10-12 MED ORDER — WARFARIN SODIUM 5 MG PO TABS
5.0000 mg | ORAL_TABLET | Freq: Once | ORAL | Status: AC
  Administered 2015-10-12: 5 mg via ORAL
  Filled 2015-10-12: qty 1

## 2015-10-12 MED ORDER — MAGNESIUM SULFATE 2 GM/50ML IV SOLN
2.0000 g | Freq: Once | INTRAVENOUS | Status: AC
  Administered 2015-10-12: 2 g via INTRAVENOUS
  Filled 2015-10-12: qty 50

## 2015-10-12 NOTE — Progress Notes (Addendum)
ANTICOAGULATION CONSULT NOTE - Follow Up Consult  Pharmacy Consult for heparin Indication: Heparin bridge with LVAD  No Known Allergies  Patient Measurements: Height: 5' (152.4 cm) Weight: 167 lb 8.8 oz (76 kg) IBW/kg (Calculated) : 45.5 Heparin Dosing Weight: 61.75 kg  Vital Signs: Temp: 98.6 F (37 C) (02/09 0700) Temp Source: Oral (02/09 0700) Pulse Rate: 85 (02/09 0700)  Labs:  Recent Labs  10/10/15 0510  10/11/15 0300 10/11/15 1323 10/12/15 0440  HGB 8.2*  < > 7.8* 9.6* 9.0*  HCT 26.1*  < > 24.7* 28.7* 28.4*  PLT 140*  < > 147* 152 141*  LABPROT 15.7*  --  16.6*  --  16.9*  INR 1.23  --  1.33  --  1.36  CREATININE 0.97  --  1.11*  --  0.96  < > = values in this interval not displayed.  Estimated Creatinine Clearance: 44 mL/min (by C-G formula based on Cr of 0.96).  Assessment: Brianna Hanson is a 81 yo F admitted 10/03/2015 for lap chole with C-drain removal which was done on 2/6. She underwent implantation of the HeartMate II LVAD on 04/06/13 for DT.  INR 1.3 this am restart warfarin. Hgb up to 9 after 1 unit yesterday. No overt bleeding noted. Possible ileus. Will start low dose heparin without bolus. Will aim for lower goal for today and adjust in the morning if stable.   Goal of Therapy:  INR 2-3 Heparin level 0.2-0.4 units/ml Monitor platelets by anticoagulation protocol: Yes   Plan:  Start heparin at 900/hr Warfarin 5mg  tonight Monitor daily HL, CBC, INR and s/sx of bleeding.  Erin Hearing PharmD., BCPS Clinical Pharmacist Pager [phone] 10/12/2015 10:19 AM

## 2015-10-12 NOTE — Progress Notes (Signed)
Admitted 10/03/15 due to planned laproscopic cholecystectomy and removal of percutaneous chole drain.   HeartMate II LVAD implated on 04/06/13 by Dr. Cyndia Bent for DT.   Vital signs: HR: 80s Doppler Pressure: 80s - 90s Arterial Line BP: 86 - 94 / 60s  MAP: 73 - 78 O2 Sat: 95 - 98% Wt: 160 > 166 > 163 > 167   LVAD interrogation reveals:  Speed: 9200 Flow: 4.4 Power: 5.2w PI: 7.4 Alarms: no further Low Flows Events: none Fixed speed: 9200  Low speed limit: 8600  Drive Line: C/D/I--changed last on Monday 10/09/15  Labs:  LDH trend: 204 > 207 > 218  INR trend: 1.23 > 1.33 > 1.36 (on heparin)  Plan/Recommendations:  1. Continue post op care per surgery team.   2. Ambulate and progress diet as tolerated.   3. BP better on midodrine.   Janene Madeira, RN VAD Coordinator   Office: [phone] 24/7 VAD Pager: [phone]

## 2015-10-12 NOTE — Progress Notes (Addendum)
3 Days Post-Op  Subjective: Episode emesis yesterday some nausea this am  Objective: Vital signs in last 24 hours: Temp:  [98.3 F (36.8 C)-99.8 F (37.7 C)] 98.6 F (37 C) (02/09 0700) Pulse Rate:  [82-116] 85 (02/09 0700) Resp:  [17-29] 18 (02/09 0700) SpO2:  [93 %-100 %] 94 % (02/09 0700) Arterial Line BP: (78-109)/(58-79) 101/71 mmHg (02/09 0700) Weight:  [76 kg (167 lb 8.8 oz)] 76 kg (167 lb 8.8 oz) (02/09 0500) Last BM Date: 10/09/15  Intake/Output from previous day: 02/08 0701 - 02/09 0700 In: 860 [P.O.:240; I.V.:210; Blood:335; IV Piggyback:75] Out: [id number] [Urine:1550] Intake/Output this shift:    GI: few bs approp tender, no hernia palpated incisions clean  Lab Results:   Recent Labs  10/11/15 1323 10/12/15 0440  WBC 14.0* 11.0*  HGB 9.6* 9.0*  HCT 28.7* 28.4*  PLT 152 141*   BMET  Recent Labs  10/11/15 0300 10/12/15 0440  NA 135 138  K 3.5 4.6  CL 104 104  CO2 23 25  GLUCOSE 109* 87  BUN 10 6  CREATININE 1.11* 0.96  CALCIUM 8.6* 8.6*   PT/INR  Recent Labs  10/11/15 0300 10/12/15 0440  LABPROT 16.6* 16.9*  INR 1.33 1.36   ABG  Recent Labs  10/09/15 0923 10/09/15 1024  PHART 7.257* 7.385  HCO3 22.6 20.9    Studies/Results: No results found.  Anti-infectives: Anti-infectives    Start     Dose/Rate Route Frequency Ordered Stop   10/09/15 1800  piperacillin-tazobactam (ZOSYN) IVPB 3.375 g     3.375 g 12.5 mL/hr over 240 Minutes Intravenous Every 8 hours 10/09/15 1633     10/05/15 0600  ceFAZolin (ANCEF) IVPB 2 g/50 mL premix  Status:  Discontinued     2 g 100 mL/hr over 30 Minutes Intravenous On call to O.R. 10/04/15 1243 10/05/15 0857   10/03/15 2000  ciprofloxacin (CIPRO) tablet 500 mg  Status:  Discontinued     500 mg Oral 2 times daily 10/03/15 1345 10/09/15 1627      Assessment/Plan: POD 3 lap chole for cholecystitis  I think she has ileus which is not expected given gb and adhesiolysis, will check axr this am to  confirm Will await resolution otherwise labs/lfts all are look good  Addendum: Xray without much of ileus which wouldn't be unexpected with her gb as above, dont think this area of air anything to be concerned about right now given her exam, clinical appearance.    [facility] 10/12/2015

## 2015-10-12 NOTE — Care Management Important Message (Signed)
Important Message  Patient Details  Name: Brianna Hanson MRN: [id number] Date of Birth: 1935/08/08   Medicare Important Message Given:  Yes    Loann Quill 10/12/2015, 8:05 AM

## 2015-10-12 NOTE — Progress Notes (Signed)
ANTICOAGULATION CONSULT NOTE - Follow Up Consult  Pharmacy Consult for heparin Indication: Heparin bridge with LVAD  No Known Allergies  Patient Measurements: Height: 5' (152.4 cm) Weight: 167 lb 8.8 oz (76 kg) IBW/kg (Calculated) : 45.5 Heparin Dosing Weight: 61.75 kg  Vital Signs: Temp: 98.6 F (37 C) (02/09 1500) Temp Source: Oral (02/09 1500) Pulse Rate: 84 (02/09 1715)  Labs:  Recent Labs  10/10/15 0510  10/11/15 0300 10/11/15 1323 10/12/15 0440 10/12/15 1834  HGB 8.2*  < > 7.8* 9.6* 9.0*  --   HCT 26.1*  < > 24.7* 28.7* 28.4*  --   PLT 140*  < > 147* 152 141*  --   LABPROT 15.7*  --  16.6*  --  16.9*  --   INR 1.23  --  1.33  --  1.36  --   HEPARINUNFRC  --   --   --   --   --  0.12*  CREATININE 0.97  --  1.11*  --  0.96  --   < > = values in this interval not displayed.  Estimated Creatinine Clearance: 44 mL/min (by C-G formula based on Cr of 0.96).  Assessment: Ms. Rasberry is a 81 yo F admitted 10/03/2015 for lap chole with C-drain removal which was done on 2/6. She underwent implantation of the HeartMate II LVAD on 04/06/13 for DT.  INR 1.3 this am restart warfarin. Hgb up to 9 after 1 unit yesterday. No overt bleeding noted. Possible ileus. Will start low dose heparin without bolus. Will aim for lower goal for today and adjust in the morning if stable.   F/u heparin level this evening just below goal at 0.12.  Per RN, no additional bleeding currently.  Goal of Therapy:  INR 2-3 Heparin level 0.2-0.4 units/ml Monitor platelets by anticoagulation protocol: Yes   Plan:  Increase heparin to 1000 units/hr, Recheck heparin level in 6 hrs. Monitor daily HL, CBC, INR and s/sx of bleeding.  Uvaldo Rising, BCPS  Clinical Pharmacist Pager [phone]  10/12/2015 7:36 PM

## 2015-10-12 NOTE — Progress Notes (Signed)
Patient ID: Brianna Hanson, female   DOB: [birth year], 81 y.o.   MRN: [id number]   HeartMate 2 Rounding Note  Subjective:   S/P Lap Cholecystectomy this am. Received 1 u PRBC interoperatively for bleeding. Todays hgb 9.0   Neo down to 5 mcg.    Complaining of N/V. Weak. Denies SOB.    KUB reviewed personally. Normal bowel gas pattern. + stool. Small amount of free air (likely due to insufflation from lap chole)   LVAD INTERROGATION:  HeartMate II LVAD:  Flow 4.6 liters/min, speed 9200, power 5, PI 7.9 No PI events.      Objective:    Vital Signs:   Temp:  [98.4 F (36.9 C)-99 F (37.2 C)] 98.6 F (37 C) (02/09 0700) Pulse Rate:  [82-116] 85 (02/09 0700) Resp:  [17-29] 18 (02/09 0700) SpO2:  [93 %-100 %] 94 % (02/09 0700) Arterial Line BP: (78-109)/(58-79) 101/71 mmHg (02/09 0700) Weight:  [167 lb 8.8 oz (76 kg)] 167 lb 8.8 oz (76 kg) (02/09 0500) Last BM Date: 10/09/15 Mean arterial Pressure 70-80s on neo 5  Intake/Output:   Intake/Output Summary (Last 24 hours) at 10/12/15 1105 Last data filed at 10/12/15 0700  Gross per 24 hour  Intake    630 ml  Output   1550 ml  Net   -920 ml     Physical Exam: GENERAL: In the chair.  NAD HEENT: normal  NECK: Supple, JVP 5-6 cm, 2+ bilaterally, no bruits. No thyromegaly or nodule note.  CARDIAC: LVAD hum present.  LUNGS: CTAB, no resp distress on 2 liters [place].  ABDOMEN: Tender, +distended in upper quadrants, no HSM. Steri strips in place from lap cholecystectomy.  Sluggish bowel sounds.   LVAD exit site: Dressing dry and intact. Stabilization device present and accurately applied. Driveline dressing changed weekly. EXTREMITIES: Warm and dry, no cyanosis, clubbing, rash, trace ankle edema . RUE Art Line  NEUROLOGIC: Alert and oriented x 4. Gait steady. No aphasia. No dysarthria. Affect pleasant.   Telemetry: NSR 70s   Labs: Basic Metabolic Panel:  Recent Labs Lab 10/06/15 0300 10/07/15 0243 10/08/15 0230   10/09/15 1024 10/09/15 1725 10/10/15 0510 10/11/15 0300 10/12/15 0440  NA 139 138 138  < > 140 140 138 135 138  K 4.5 4.5 4.0  < > 4.0 3.8 3.8 3.5 4.6  CL 108 108 110  --   --  111 105 104 104  CO2 23 21* 21*  --   --   --  23 23 25   GLUCOSE 96 85 87  --   --  110* 109* 109* 87  BUN 19 18 20   --   --  12 8 10 6   CREATININE 1.29* 1.07* 1.06*  --   --  0.80 0.97 1.11* 0.96  CALCIUM 9.1 9.0 8.8*  --   --   --  8.4* 8.6* 8.6*  MG 1.9  --   --   --   --   --   --  1.7  --   < > = values in this interval not displayed.  Liver Function Tests:  Recent Labs Lab 10/10/15 0510 10/11/15 0300 10/12/15 0440  AST 56* 34 23  ALT 42 31 25  ALKPHOS 48 46 54  BILITOT 0.9 0.9 0.9  PROT 6.4* 6.6 6.4*  ALBUMIN 2.7* 2.6* 2.4*   No results for input(s): LIPASE, AMYLASE in the last 168 hours. No results for input(s): AMMONIA in the last 168 hours.  CBC:  Recent Labs Lab 10/10/15 0510 10/10/15 1330 10/11/15 0300 10/11/15 1323 10/12/15 0440  WBC 10.6* 14.5* 14.2* 14.0* 11.0*  HGB 8.2* 8.7* 7.8* 9.6* 9.0*  HCT 26.1* 27.3* 24.7* 28.7* 28.4*  MCV 87.6 88.1 88.2 87.8 89.0  PLT 140* 157 147* 152 141*    INR:  Recent Labs Lab 10/08/15 0230 10/09/15 0211 10/10/15 0510 10/11/15 0300 10/12/15 0440  INR 1.29 1.30 1.23 1.33 1.36    Other results:  Imaging: Dg Abd Portable 1v  10/12/2015  CLINICAL DATA:  Ileus post LVAD surgery EXAM: PORTABLE ABDOMEN - 1 VIEW COMPARISON:  10/04/2015 FINDINGS: Bowel gas pattern is nonobstructed. There is an irregular collection of gas just lateral to the left iliac crest, raising the question of soft tissue abscess or extraluminal gas. Consider further evaluation is CT of the abdomen and pelvis. Degenerative changes are seen in the lumbar spine and bilateral hips. Surgical clips are seen in the right upper quadrant the abdomen. IMPRESSION: 1. Suspicious collection of gas lateral to the left iliac crest warrants further evaluation to exclude abscess or  extraluminal gas. 2. CT of the abdomen and pelvis is recommended. Intravenous and Oral contrast would be helpful and if not contraindicated. These results will be called to the ordering clinician or representative by the Radiologist Assistant, and communication documented in the PACS or zVision Dashboard. Electronically Signed   By: Nolon Nations M.D.   On: 10/12/2015 09:47     Medications:     Scheduled Medications: . amiodarone  100 mg Oral Daily  . cholecalciferol  1,000 Units Oral Daily  . docusate sodium  100 mg Oral BID  . gabapentin  100 mg Oral QHS  . magnesium oxide  400 mg Oral Daily  . magnesium sulfate 1 - 4 g bolus IVPB  2 g Intravenous Once  . metoCLOPramide (REGLAN) injection  5 mg Intravenous 4 times per day  . midodrine  5 mg Oral TID WC  . pantoprazole  40 mg Oral Daily  . piperacillin-tazobactam (ZOSYN)  IV  3.375 g Intravenous Q8H    Infusions: . heparin    . phenylephrine (NEO-SYNEPHRINE) Adult infusion 5 mcg/min (10/12/15 0700)    PRN Medications: morphine injection, ondansetron (ZOFRAN) IV, oxyCODONE-acetaminophen, traMADol   Assessment:   1. Chronic cholecystitis 2. Chronic systolic HF s/p LVAD HMII implantation. 3. PAF 4. HTN 5. NSVT 6. Hypomagnesemia 7. CKD stage 3  Plan/Discussion:    S/P Lap Cholecystectomy- N/V. Add reglan.   Off anticoagulants for now. S/P 1UPRBCs intra op. Received 1UPRBCs 2/7. Hgb today  9.0.  Continue amio 100 mg daily for her PAF.   MAPs a little better. Continue 5 mg midodrine. Try to wean off neo.Once  off will discontinue a- line.   Still with frequent PVCs and occasional brief NSVT. K 4.6  Mag 1.7 Give 2 grams Mag.    I reviewed the LVAD parameters from today, and compared the results to the patient's prior recorded data.  No programming changes were made.  The LVAD is functioning within specified parameters.  The patient performs LVAD self-test daily.  LVAD interrogation was negative for any significant  power changes, alarms or PI events/speed drops.  LVAD equipment check completed and is in good working order.  Back-up equipment present.   LVAD education done on emergency procedures and precautions and reviewed exit site care.  Length of Stay: [place] NP-C  10/12/2015, 11:05 AM  VAD Team --- VAD ISSUES ONLY--- Pager [phone] (7am - 7am)  Advanced Heart Failure Team  Pager [phone] (M-F; 7a - 4p)  Please contact [facility] for night-coverage after hours (4p -7a ) and weekends on   Patient seen and examined with Darrick Grinder, NP. We discussed all aspects of the encounter. I agree with the assessment and plan as stated above.   Seems to have post-op ileus. No obstruction on KUB. Will start Reglan. Can put NG tube down if more uncomfortable. Will try to mobilize. D/w Dr. Donne Hazel.   BP remains soft. Continue midodrine. Wean neo as tolerated. Hgb stable at 9.0. HF volume status ok. Continue with PVCs. Supp mag.  Will start low-dose heparin and coumadin today.   Jennifer Payes,MD 12:31 PM

## 2015-10-13 LAB — COMPREHENSIVE METABOLIC PANEL
ALT: 20 U/L (ref 14–54)
AST: 18 U/L (ref 15–41)
Albumin: 2.2 g/dL — ABNORMAL LOW (ref 3.5–5.0)
Alkaline Phosphatase: 45 U/L (ref 38–126)
Anion gap: 8 (ref 5–15)
BUN: <5 mg/dL — ABNORMAL LOW (ref 6–20)
CO2: 25 mmol/L (ref 22–32)
Calcium: 8.3 mg/dL — ABNORMAL LOW (ref 8.9–10.3)
Chloride: 105 mmol/L (ref 101–111)
Creatinine, Ser: 0.93 mg/dL (ref 0.44–1.00)
GFR calc Af Amer: >60 mL/min (ref >60)
GFR calc non Af Amer: 57 mL/min — ABNORMAL LOW (ref >60)
Glucose, Bld: 87 mg/dL (ref 65–99)
Potassium: 3.9 mmol/L (ref 3.5–5.1)
Sodium: 138 mmol/L (ref 135–145)
Total Bilirubin: 0.7 mg/dL (ref 0.3–1.2)
Total Protein: 6.4 g/dL — ABNORMAL LOW (ref 6.5–8.1)

## 2015-10-13 LAB — CBC
HCT: 26.7 % — ABNORMAL LOW (ref 36.0–46.0)
HCT: 28.9 % — ABNORMAL LOW (ref 36.0–46.0)
Hemoglobin: 8.4 g/dL — ABNORMAL LOW (ref 12.0–15.0)
Hemoglobin: 9.1 g/dL — ABNORMAL LOW (ref 12.0–15.0)
MCH: 28.1 pg (ref 26.0–34.0)
MCH: 28.3 pg (ref 26.0–34.0)
MCHC: 31.5 g/dL (ref 30.0–36.0)
MCHC: 31.5 g/dL (ref 30.0–36.0)
MCV: 89.3 fL (ref 78.0–100.0)
MCV: 90 fL (ref 78.0–100.0)
Platelets: 150 10*3/uL (ref 150–400)
Platelets: 192 10*3/uL (ref 150–400)
RBC: 2.99 MIL/uL — ABNORMAL LOW (ref 3.87–5.11)
RBC: 3.21 MIL/uL — ABNORMAL LOW (ref 3.87–5.11)
RDW: 18.7 % — ABNORMAL HIGH (ref 11.5–15.5)
RDW: 18.6 % — ABNORMAL HIGH (ref 11.5–15.5)
WBC: 9 10*3/uL (ref 4.0–10.5)
WBC: 9 10*3/uL (ref 4.0–10.5)

## 2015-10-13 LAB — LACTATE DEHYDROGENASE: LDH: 213 U/L — ABNORMAL HIGH (ref 98–192)

## 2015-10-13 LAB — PROTIME-INR
INR: 1.32 (ref 0.00–1.49)
Prothrombin Time: 16.5 seconds — ABNORMAL HIGH (ref 11.6–15.2)

## 2015-10-13 LAB — MAGNESIUM: Magnesium: 2.2 mg/dL (ref 1.7–2.4)

## 2015-10-13 LAB — HEPARIN LEVEL (UNFRACTIONATED)
Heparin Unfractionated: 0.15 IU/mL — ABNORMAL LOW (ref 0.30–0.70)
Heparin Unfractionated: 0.22 IU/mL — ABNORMAL LOW (ref 0.30–0.70)

## 2015-10-13 MED ORDER — METOCLOPRAMIDE HCL 5 MG/ML IJ SOLN
5.0000 mg | Freq: Three times a day (TID) | INTRAMUSCULAR | Status: DC
  Administered 2015-10-14 – 2015-10-17 (×8): 5 mg via INTRAVENOUS
  Filled 2015-10-13 (×9): qty 2

## 2015-10-13 MED ORDER — WARFARIN SODIUM 5 MG PO TABS
5.0000 mg | ORAL_TABLET | Freq: Once | ORAL | Status: AC
  Administered 2015-10-13: 5 mg via ORAL
  Filled 2015-10-13: qty 1

## 2015-10-13 MED ORDER — METOCLOPRAMIDE HCL 5 MG/ML IJ SOLN
10.0000 mg | Freq: Three times a day (TID) | INTRAMUSCULAR | Status: AC
  Administered 2015-10-13 (×2): 10 mg via INTRAVENOUS
  Filled 2015-10-13 (×3): qty 2

## 2015-10-13 NOTE — Progress Notes (Signed)
ANTICOAGULATION CONSULT NOTE - Follow Up Consult  Pharmacy Consult for heparin Indication: LVAD  Labs:  Recent Labs  10/11/15 0300 10/11/15 1323 10/12/15 0440 10/12/15 1834 10/13/15 0240  HGB 7.8* 9.6* 9.0*  --  8.4*  HCT 24.7* 28.7* 28.4*  --  26.7*  PLT 147* 152 141*  --  150  LABPROT 16.6*  --  16.9*  --  16.5*  INR 1.33  --  1.36  --  1.32  HEPARINUNFRC  --   --   --  0.12* 0.15*  CREATININE 1.11*  --  0.96  --  0.93     Assessment: 81yo female remains subtherapeutic on heparin after rate increase.  Goal of Therapy:  Heparin level 0.2-0.4 units/ml   Plan:  Will increase heparin gtt to previously therapeutic rate of 1100 units/hr and check level in [place], PharmD, BCPS  10/13/2015,4:46 AM

## 2015-10-13 NOTE — Progress Notes (Signed)
Admitted 10/03/15 due to planned laproscopic cholecystectomy and removal of percutaneous chole drain.   HeartMate II LVAD implated on 04/06/13 by Dr. Cyndia Bent for DT.   Vital signs: HR: 80s Doppler Pressure: 80s - 90s Arterial Line BP: 86 - 94 / 60s  MAP: 73 - 78 O2 Sat: 95 - 98% Wt: 160 > 166 > 163 > 167   LVAD interrogation reveals:  Speed: 9200 Flow: 3.9 Power: 4.6w PI: 7.8 Alarms: no further Low Flows Events: none Fixed speed: 9200  Low speed limit: 8600  Drive Line: C/D/I--changed last on Monday 10/09/15  Labs:  LDH trend: 204 > 207 > 218  INR trend: 1.23 > 1.33 > 1.36 (on heparin)  Hgb: 9.6 > 9.0 > 8.4 > 9.1  Plan/Recommendations:  1. Continue post op care per surgery team.   2. Ambulate and progress diet as tolerated. Ileus resolving. Reports she had done well with nausea and clear liquids today.   3. BP better on midodrine.   Janene Madeira, RN VAD Coordinator   Office: [phone] 24/7 VAD Pager: [phone]

## 2015-10-13 NOTE — Progress Notes (Addendum)
ANTICOAGULATION CONSULT NOTE - Follow Up Consult  Pharmacy Consult for heparin Indication: Heparin bridge with LVAD  No Known Allergies  Patient Measurements: Height: 5' (152.4 cm) Weight: 166 lb 14.2 oz (75.7 kg) IBW/kg (Calculated) : 45.5 Heparin Dosing Weight: 61.75 kg  Vital Signs: Temp: 99 F (37.2 C) (02/10 0400) Temp Source: Oral (02/10 0400) Pulse Rate: 73 (02/10 0600)  Labs:  Recent Labs  10/11/15 0300 10/11/15 1323 10/12/15 0440 10/12/15 1834 10/13/15 0240  HGB 7.8* 9.6* 9.0*  --  8.4*  HCT 24.7* 28.7* 28.4*  --  26.7*  PLT 147* 152 141*  --  150  LABPROT 16.6*  --  16.9*  --  16.5*  INR 1.33  --  1.36  --  1.32  HEPARINUNFRC  --   --   --  0.12* 0.15*  CREATININE 1.11*  --  0.96  --  0.93    Estimated Creatinine Clearance: 45.3 mL/min (by C-G formula based on Cr of 0.93).  Assessment: Brianna Hanson is a 81 yo F admitted 10/03/2015 for lap chole with C-drain removal which was done on 2/6. She underwent implantation of the HeartMate II LVAD on 04/06/13 for DT.  INR 1.3 this am after restart warfarin. Hgb down to 8.4 . No overt bleeding noted. Possible ileus improving.   Heparin level still low after rate increase this am, no bleeding noted. Will leave at current rate aiming for low end of goal with drop in hgb this am.  Goal of Therapy:  INR 2-3 Heparin level 0.2-0.4 units/ml Monitor platelets by anticoagulation protocol: Yes   Plan:  Continue heparin at 1100 units/hr for another day until cbc stable Warfarin 5mg  again tonight Monitor daily HL, CBC, INR and s/sx of bleeding.  Erin Hearing PharmD., BCPS Clinical Pharmacist Pager [phone] 10/13/2015 1:31 PM   Addendum: CBC improved this afternoon with hgb 8.4>9.1. No bleeding issues noted. Will increast heparin to 1200/hr and increase heparin level goal to 0.3-0.5. 10/13/2015 3:24 PM

## 2015-10-13 NOTE — Progress Notes (Addendum)
4 Days Post-Op  Subjective: Having flatus, no more n/v, walked yesterday  Objective: Vital signs in last 24 hours: Temp:  [98.3 F (36.8 C)-99 F (37.2 C)] 99 F (37.2 C) (02/10 0400) Pulse Rate:  [73-105] 79 (02/10 0700) Resp:  [14-30] 21 (02/10 0700) SpO2:  [91 %-100 %] 95 % (02/10 0700) Arterial Line BP: (78-144)/(57-93) 135/70 mmHg (02/10 0700) Weight:  [75.7 kg (166 lb 14.2 oz)] 75.7 kg (166 lb 14.2 oz) (02/10 0500) Last BM Date: 10/09/15  Intake/Output from previous day: 02/09 0701 - 02/10 0700 In: 419.7 [I.V.:219.7; IV Piggyback:200] Out: 700 [Urine:700] Intake/Output this shift:    GI: incisions clean, some bs present approp tender  Lab Results:   Recent Labs  10/12/15 0440 10/13/15 0240  WBC 11.0* 9.0  HGB 9.0* 8.4*  HCT 28.4* 26.7*  PLT 141* 150   BMET  Recent Labs  10/12/15 0440 10/13/15 0240  NA 138 138  K 4.6 3.9  CL 104 105  CO2 25 25  GLUCOSE 87 87  BUN 6 <5*  CREATININE 0.96 0.93  CALCIUM 8.6* 8.3*   PT/INR  Recent Labs  10/12/15 0440 10/13/15 0240  LABPROT 16.9* 16.5*  INR 1.36 1.32   ABG No results for input(s): PHART, HCO3 in the last 72 hours.  Invalid input(s): PCO2, PO2  Studies/Results: Dg Abd Portable 1v  10/12/2015  CLINICAL DATA:  Ileus post LVAD surgery EXAM: PORTABLE ABDOMEN - 1 VIEW COMPARISON:  10/04/2015 FINDINGS: Bowel gas pattern is nonobstructed. There is an irregular collection of gas just lateral to the left iliac crest, raising the question of soft tissue abscess or extraluminal gas. Consider further evaluation is CT of the abdomen and pelvis. Degenerative changes are seen in the lumbar spine and bilateral hips. Surgical clips are seen in the right upper quadrant the abdomen. IMPRESSION: 1. Suspicious collection of gas lateral to the left iliac crest warrants further evaluation to exclude abscess or extraluminal gas. 2. CT of the abdomen and pelvis is recommended. Intravenous and Oral contrast would be helpful and  if not contraindicated. These results will be called to the ordering clinician or representative by the Radiologist Assistant, and communication documented in the PACS or zVision Dashboard. Electronically Signed   By: Nolon Nations M.D.   On: 10/12/2015 09:47    Anti-infectives: Anti-infectives    Start     Dose/Rate Route Frequency Ordered Stop   10/09/15 1800  piperacillin-tazobactam (ZOSYN) IVPB 3.375 g     3.375 g 12.5 mL/hr over 240 Minutes Intravenous Every 8 hours 10/09/15 1633     10/05/15 0600  ceFAZolin (ANCEF) IVPB 2 g/50 mL premix  Status:  Discontinued     2 g 100 mL/hr over 30 Minutes Intravenous On call to O.R. 10/04/15 1243 10/05/15 0857   10/03/15 2000  ciprofloxacin (CIPRO) tablet 500 mg  Status:  Discontinued     500 mg Oral 2 times daily 10/03/15 1345 10/09/15 1627      Assessment/Plan: POD 4 lap chole  Full liquids, advance diet as tolerated Continue increasing activity with ambulation Doing fine on heparin   [facility] 10/13/2015

## 2015-10-13 NOTE — Progress Notes (Signed)
CARDIAC REHAB PHASE I   PRE:  Rate/Rhythm: 82 SR  BP:  Supine:   Sitting: 100 MAP  Standing:    SaO2: 96%RA  MODE:  Ambulation: 300 ft   POST:  Rate/Rhythm: 105  BP:  Supine:   Sitting: 100 MAP  Standing:    SaO2: 92%RA 1300-1329 Pt walked 300 ft on RA with rollator and asst x 2 with steady gait. Tolerated well. Did not want to go farther. To recliner after walk. Pt had not difficulty switching to batteries and power source. In good spirits.   Graylon Good, RN BSN  10/13/2015 1:24 PM

## 2015-10-13 NOTE — Progress Notes (Signed)
Patient ID: Brianna Hanson, female   DOB: 1934/12/17, 81 y.o.   MRN: [id number]   HeartMate 2 Rounding Note  Subjective:   S/P Lap Cholecystectomy. Received 1 u PRBC interoperatively for bleeding. Todays hgb 8.4 .Off neo.   Denies SOB. Mild pain. Passing gas. No further n/v. On Reglan. Ambulated around the unit.     LVAD INTERROGATION:  HeartMate II LVAD:  Flow 4.4 liters/min, speed 9200, power 5, PI 7.4 No PI events.      Objective:    Vital Signs:   Temp:  [98.3 F (36.8 C)-99 F (37.2 C)] 99 F (37.2 C) (02/10 0400) Pulse Rate:  [73-105] 79 (02/10 0700) Resp:  [14-30] 21 (02/10 0700) SpO2:  [88 %-100 %] 95 % (02/10 0700) Arterial Line BP: (78-144)/(57-93) 135/70 mmHg (02/10 0700) Weight:  [166 lb 14.2 oz (75.7 kg)] 166 lb 14.2 oz (75.7 kg) (02/10 0500) Last BM Date: 10/09/15 Mean arterial Pressure 80s  Intake/Output:   Intake/Output Summary (Last 24 hours) at 10/13/15 0728 Last data filed at 10/13/15 0700  Gross per 24 hour  Intake 419.73 ml  Output    700 ml  Net -280.27 ml     Physical Exam: GENERAL: In the bed.   NAD HEENT: normal  NECK: Supple, JVP 5-6 cm, 2+ bilaterally, no bruits. No thyromegaly or nodule note.  CARDIAC: LVAD hum present.  LUNGS: CTAB, no resp distress on 2 liters [place].  ABDOMEN: Tender, +distended in upper quadrants, no HSM. Steri strips in place from lap cholecystectomy.  + bowel sounds.    LVAD exit site: Dressing dry and intact. Stabilization device present and accurately applied. Driveline dressing changed weekly. EXTREMITIES: Warm and dry, no cyanosis, clubbing, rash, trace ankle edema . RUE Art Line  NEUROLOGIC: Alert and oriented x 4. Gait steady. No aphasia. No dysarthria. Affect pleasant.   Telemetry: NSR 70s   Labs: Basic Metabolic Panel:  Recent Labs Lab 10/08/15 0230  10/09/15 1725 10/10/15 0510 10/11/15 0300 10/12/15 0440 10/13/15 0240  NA 138  < > 140 138 135 138 138  K 4.0  < > 3.8 3.8 3.5 4.6 3.9   CL 110  --  111 105 104 104 105  CO2 21*  --   --  23 23 25 25   GLUCOSE 87  --  110* 109* 109* 87 87  BUN 20  --  12 8 10 6  <5*  CREATININE 1.06*  --  0.80 0.97 1.11* 0.96 0.93  CALCIUM 8.8*  --   --  8.4* 8.6* 8.6* 8.3*  MG  --   --   --   --  1.7  --  2.2  < > = values in this interval not displayed.  Liver Function Tests:  Recent Labs Lab 10/10/15 0510 10/11/15 0300 10/12/15 0440 10/13/15 0240  AST 56* 34 23 18  ALT 42 31 25 20   ALKPHOS 48 46 54 45  BILITOT 0.9 0.9 0.9 0.7  PROT 6.4* 6.6 6.4* 6.4*  ALBUMIN 2.7* 2.6* 2.4* 2.2*   No results for input(s): LIPASE, AMYLASE in the last 168 hours. No results for input(s): AMMONIA in the last 168 hours.  CBC:  Recent Labs Lab 10/10/15 1330 10/11/15 0300 10/11/15 1323 10/12/15 0440 10/13/15 0240  WBC 14.5* 14.2* 14.0* 11.0* 9.0  HGB 8.7* 7.8* 9.6* 9.0* 8.4*  HCT 27.3* 24.7* 28.7* 28.4* 26.7*  MCV 88.1 88.2 87.8 89.0 89.3  PLT 157 147* 152 141* 150    INR:  Recent Labs Lab  10/09/15 0211 10/10/15 0510 10/11/15 0300 10/12/15 0440 10/13/15 0240  INR 1.30 1.23 1.33 1.36 1.32    Other results:  Imaging: Dg Abd Portable 1v  10/12/2015  CLINICAL DATA:  Ileus post LVAD surgery EXAM: PORTABLE ABDOMEN - 1 VIEW COMPARISON:  10/04/2015 FINDINGS: Bowel gas pattern is nonobstructed. There is an irregular collection of gas just lateral to the left iliac crest, raising the question of soft tissue abscess or extraluminal gas. Consider further evaluation is CT of the abdomen and pelvis. Degenerative changes are seen in the lumbar spine and bilateral hips. Surgical clips are seen in the right upper quadrant the abdomen. IMPRESSION: 1. Suspicious collection of gas lateral to the left iliac crest warrants further evaluation to exclude abscess or extraluminal gas. 2. CT of the abdomen and pelvis is recommended. Intravenous and Oral contrast would be helpful and if not contraindicated. These results will be called to the ordering  clinician or representative by the Radiologist Assistant, and communication documented in the PACS or zVision Dashboard. Electronically Signed   By: Nolon Nations M.D.   On: 10/12/2015 09:47     Medications:     Scheduled Medications: . amiodarone  100 mg Oral Daily  . cholecalciferol  1,000 Units Oral Daily  . docusate sodium  100 mg Oral BID  . gabapentin  100 mg Oral QHS  . magnesium oxide  400 mg Oral Daily  . metoCLOPramide (REGLAN) injection  5 mg Intravenous 4 times per day  . midodrine  5 mg Oral TID WC  . pantoprazole  40 mg Oral Daily  . piperacillin-tazobactam (ZOSYN)  IV  3.375 g Intravenous Q8H  . Warfarin - Pharmacist Dosing Inpatient   Does not apply q1800    Infusions: . heparin 1,100 Units/hr (10/13/15 0500)  . phenylephrine (NEO-SYNEPHRINE) Adult infusion Stopped (10/12/15 1200)    PRN Medications: morphine injection, ondansetron (ZOFRAN) IV, oxyCODONE-acetaminophen, traMADol   Assessment:   1. Chronic cholecystitis 2. Chronic systolic HF s/p LVAD HMII implantation. 3. PAF 4. HTN 5. NSVT 6. Hypomagnesemia 7. CKD stage 3  Plan/Discussion:    S/P Lap Cholecystectomy   S/P 1UPRBCs intra op. Received 1UPRBCs 2/7. On heparin. Hgb trending down 9.0>8.4 . Repeat CBC later today.   Continue amio 100 mg daily for her PAF.   MAPs better.  Continue 5 mg midodrine.  Remove A-line.   Still with frequent PVCs and occasional brief NSVT. K 3.9  Mag  2.2     I reviewed the LVAD parameters from today, and compared the results to the patient's prior recorded data.  No programming changes were made.  The LVAD is functioning within specified parameters.  The patient performs LVAD self-test daily.  LVAD interrogation was negative for any significant power changes, alarms or PI events/speed drops.  LVAD equipment check completed and is in good working order.  Back-up equipment present.   LVAD education done on emergency procedures and precautions and reviewed exit  site care.  Length of Stay: [place] NP-C  10/13/2015, 7:28 AM  VAD Team --- VAD ISSUES ONLY--- Pager [phone] (7am - 7am)  Advanced Heart Failure Team  Pager [phone] (M-F; 7a - 4p)  Please contact [facility] for night-coverage after hours (4p -7a ) and weekends on    Patient seen and examined with Darrick Grinder, NP. We discussed all aspects of the encounter. I agree with the assessment and plan as stated above.   She is much improved. Ileus resolving. Hgb drifting down slowly  but no overt bleeding. Continue heparin and coumadin. Will recheck CBC later today. BP much improved. Now off pressors. Can wean midodrine as tolerated.   HF status stable. Electrolytes improved. Continue to ambulate. Keep in 2S today.   [name], Daniel,MD 7:43 AM

## 2015-10-14 LAB — COMPREHENSIVE METABOLIC PANEL
ALT: 15 U/L (ref 14–54)
AST: 14 U/L — ABNORMAL LOW (ref 15–41)
Albumin: 2.3 g/dL — ABNORMAL LOW (ref 3.5–5.0)
Alkaline Phosphatase: 46 U/L (ref 38–126)
Anion gap: 10 (ref 5–15)
BUN: <5 mg/dL — ABNORMAL LOW (ref 6–20)
CO2: 25 mmol/L (ref 22–32)
Calcium: 8.3 mg/dL — ABNORMAL LOW (ref 8.9–10.3)
Chloride: 104 mmol/L (ref 101–111)
Creatinine, Ser: 0.88 mg/dL (ref 0.44–1.00)
GFR calc Af Amer: >60 mL/min (ref >60)
GFR calc non Af Amer: >60 mL/min (ref >60)
Glucose, Bld: 87 mg/dL (ref 65–99)
Potassium: 3.8 mmol/L (ref 3.5–5.1)
Sodium: 139 mmol/L (ref 135–145)
Total Bilirubin: 0.2 mg/dL — ABNORMAL LOW (ref 0.3–1.2)
Total Protein: 6.4 g/dL — ABNORMAL LOW (ref 6.5–8.1)

## 2015-10-14 LAB — CBC
HCT: 27 % — ABNORMAL LOW (ref 36.0–46.0)
Hemoglobin: 8.5 g/dL — ABNORMAL LOW (ref 12.0–15.0)
MCH: 28.2 pg (ref 26.0–34.0)
MCHC: 31.5 g/dL (ref 30.0–36.0)
MCV: 89.7 fL (ref 78.0–100.0)
Platelets: 184 10*3/uL (ref 150–400)
RBC: 3.01 MIL/uL — ABNORMAL LOW (ref 3.87–5.11)
RDW: 18.3 % — ABNORMAL HIGH (ref 11.5–15.5)
WBC: 7.2 10*3/uL (ref 4.0–10.5)

## 2015-10-14 LAB — PROTIME-INR
INR: 1.41 (ref 0.00–1.49)
Prothrombin Time: 17.3 seconds — ABNORMAL HIGH (ref 11.6–15.2)

## 2015-10-14 LAB — HEPARIN LEVEL (UNFRACTIONATED): Heparin Unfractionated: 0.34 IU/mL (ref 0.30–0.70)

## 2015-10-14 LAB — LACTATE DEHYDROGENASE: LDH: 223 U/L — ABNORMAL HIGH (ref 98–192)

## 2015-10-14 MED ORDER — WARFARIN SODIUM 5 MG PO TABS
5.0000 mg | ORAL_TABLET | Freq: Once | ORAL | Status: AC
  Administered 2015-10-14: 5 mg via ORAL
  Filled 2015-10-14: qty 1

## 2015-10-14 NOTE — Progress Notes (Signed)
ANTICOAGULATION CONSULT NOTE - Follow Up Consult  Pharmacy Consult for Heparin/Coumadin Indication: LVAD  No Known Allergies  Patient Measurements: Height: 5' (152.4 cm) Weight: 151 lb 14.4 oz (68.9 kg) IBW/kg (Calculated) : 45.5 Heparin Dosing Weight: 61.75 kg  Vital Signs: Temp: 98.1 F (36.7 C) (02/11 1125) Temp Source: Oral (02/11 1125) Pulse Rate: 77 (02/11 1000)  Labs:  Recent Labs  10/12/15 0440  10/13/15 0240 10/13/15 1122 10/13/15 1418 10/14/15 0352  HGB 9.0*  --  8.4*  --  9.1* 8.5*  HCT 28.4*  --  26.7*  --  28.9* 27.0*  PLT 141*  --  150  --  192 184  LABPROT 16.9*  --  16.5*  --   --  17.3*  INR 1.36  --  1.32  --   --  1.41  HEPARINUNFRC  --   < > 0.15* 0.22*  --  0.34  CREATININE 0.96  --  0.93  --   --  0.88  < > = values in this interval not displayed.  Estimated Creatinine Clearance: 45.7 mL/min (by C-G formula based on Cr of 0.88).  Assessment:  AC: Afib/LVAD, heparin>>warfarin bridge for LVAD.   - PTA Coumadin dose 2.5 mg daily except 5 on Wed. LD 1/28. S/p lap/chole 2/6 - lots bleeding in OR and possible hematoma around one of the incisions sites - site softer today, hgb 8.5 down this am, 1 unit 2/8 2/11: HL 0.34 ok, INR 1.41, Hgb 8.5. Increase INR slowly due to risk of bleeding   Goal of Therapy:  HL 0.3-0.5 Monitor platelets by anticoagulation protocol: Yes   Plan:  Continue heparin at 1200/hr Daily HL and CBC Warfarin 5mg  again tonight. May need to increase dose tomorrow.    Brianna Hanson, PharmD, BCPS Clinical Staff Pharmacist Pager [phone]  Brianna Hanson 10/14/2015,11:32 AM

## 2015-10-14 NOTE — Plan of Care (Signed)
Problem: Nutrition: Goal: Adequate nutrition will be maintained Outcome: Progressing Advanced diet today, tolerating better

## 2015-10-14 NOTE — Progress Notes (Signed)
5 Days Post-Op  Subjective: Sitting in chair  No complaints   Objective: Vital signs in last 24 hours: Temp:  [98.1 F (36.7 C)-98.8 F (37.1 C)] 98.1 F (36.7 C) (02/11 0717) Pulse Rate:  [71-87] 74 (02/11 0600) Resp:  [13-29] 13 (02/11 0600) SpO2:  [93 %-99 %] 94 % (02/11 0600) Arterial Line BP: (86-114)/(73-93) 86/78 mmHg (02/10 1100) Weight:  [68.9 kg (151 lb 14.4 oz)] 68.9 kg (151 lb 14.4 oz) (02/11 0600) Last BM Date: 10/09/15  Intake/Output from previous day: 02/10 0701 - 02/11 0700 In: 897 [P.O.:480; I.V.:267; IV Piggyback:150] Out: 1500 [Urine:1500] Intake/Output this shift:    Incision/Wound:port sites  CDI  Soft   Lab Results:   Recent Labs  10/13/15 1418 10/14/15 0352  WBC 9.0 7.2  HGB 9.1* 8.5*  HCT 28.9* 27.0*  PLT 192 184   BMET  Recent Labs  10/13/15 0240 10/14/15 0352  NA 138 139  K 3.9 3.8  CL 105 104  CO2 25 25  GLUCOSE 87 87  BUN <5* <5*  CREATININE 0.93 0.88  CALCIUM 8.3* 8.3*   PT/INR  Recent Labs  10/13/15 0240 10/14/15 0352  LABPROT 16.5* 17.3*  INR 1.32 1.41   ABG No results for input(s): PHART, HCO3 in the last 72 hours.  Invalid input(s): PCO2, PO2  Studies/Results: Dg Abd Portable 1v  10/12/2015  CLINICAL DATA:  Ileus post LVAD surgery EXAM: PORTABLE ABDOMEN - 1 VIEW COMPARISON:  10/04/2015 FINDINGS: Bowel gas pattern is nonobstructed. There is an irregular collection of gas just lateral to the left iliac crest, raising the question of soft tissue abscess or extraluminal gas. Consider further evaluation is CT of the abdomen and pelvis. Degenerative changes are seen in the lumbar spine and bilateral hips. Surgical clips are seen in the right upper quadrant the abdomen. IMPRESSION: 1. Suspicious collection of gas lateral to the left iliac crest warrants further evaluation to exclude abscess or extraluminal gas. 2. CT of the abdomen and pelvis is recommended. Intravenous and Oral contrast would be helpful and if not  contraindicated. These results will be called to the ordering clinician or representative by the Radiologist Assistant, and communication documented in the PACS or zVision Dashboard. Electronically Signed   By: Nolon Nations M.D.   On: 10/12/2015 09:47    Anti-infectives: Anti-infectives    Start     Dose/Rate Route Frequency Ordered Stop   10/09/15 1800  piperacillin-tazobactam (ZOSYN) IVPB 3.375 g     3.375 g 12.5 mL/hr over 240 Minutes Intravenous Every 8 hours 10/09/15 1633     10/05/15 0600  ceFAZolin (ANCEF) IVPB 2 g/50 mL premix  Status:  Discontinued     2 g 100 mL/hr over 30 Minutes Intravenous On call to O.R. 10/04/15 1243 10/05/15 0857   10/03/15 2000  ciprofloxacin (CIPRO) tablet 500 mg  Status:  Discontinued     500 mg Oral 2 times daily 10/03/15 1345 10/09/15 1627      Assessment/Plan: s/p Procedure(s): LAPAROSCOPIC CHOLECYSTECTOMY (N/A) Doing well from surgery standpoint   LOS: 11 days    Katalena Malveaux A. 10/14/2015

## 2015-10-14 NOTE — Progress Notes (Signed)
Patient ID: Brianna Hanson, female   DOB: 03-Jun-1935, 81 y.o.   MRN: [id number]   HeartMate 2 Rounding Note  Subjective:   S/P Lap Cholecystectomy. Received 1 u PRBC interoperatively for bleeding. Todays hgb 8.5.  Off phenylephrine, getting midodrine but MAP 90s-100s.    Liquid diet, tolerating so far.  Mild abdominal discomfort but getting better.   LVAD INTERROGATION:  HeartMate II LVAD:  Flow 4.2 liters/min, speed 9200, power 5, PI 8 No PI events.      Objective:    Vital Signs:   Temp:  [98.1 F (36.7 C)-98.8 F (37.1 C)] 98.1 F (36.7 C) (02/11 1125) Pulse Rate:  [71-88] 88 (02/11 1200) Resp:  [13-26] 25 (02/11 1200) BP: (101)/(84) 101/84 mmHg (02/11 1200) SpO2:  [90 %-98 %] 90 % (02/11 1200) Weight:  [151 lb 14.4 oz (68.9 kg)] 151 lb 14.4 oz (68.9 kg) (02/11 0600) Last BM Date: 10/09/15 Mean arterial Pressure 90s-100s  Intake/Output:   Intake/Output Summary (Last 24 hours) at 10/14/15 1208 Last data filed at 10/14/15 1000  Gross per 24 hour  Intake    638 ml  Output   1050 ml  Net   -412 ml     Physical Exam: GENERAL: In the bed.   NAD HEENT: normal  NECK: Supple, JVP 5-6 cm. No thyromegaly or nodule noted.  CARDIAC: LVAD hum present.  LUNGS: CTAB, no resp distress on 2 liters [place].  ABDOMEN: Tender, +distended in upper quadrants, no HSM. Steri strips in place from lap cholecystectomy.  + bowel sounds.    LVAD exit site: Dressing dry and intact. Stabilization device present and accurately applied. Driveline dressing changed weekly. EXTREMITIES: Warm and dry, no cyanosis, clubbing, rash, trace ankle edema.  NEUROLOGIC: Alert and oriented x 4. Gait steady. No aphasia. No dysarthria. Affect pleasant.   Telemetry: NSR 70s   Labs: Basic Metabolic Panel:  Recent Labs Lab 10/10/15 0510 10/11/15 0300 10/12/15 0440 10/13/15 0240 10/14/15 0352  NA 138 135 138 138 139  K 3.8 3.5 4.6 3.9 3.8  CL 105 104 104 105 104  CO2 23 23 25 25 25   GLUCOSE  109* 109* 87 87 87  BUN 8 10 6  <5* <5*  CREATININE 0.97 1.11* 0.96 0.93 0.88  CALCIUM 8.4* 8.6* 8.6* 8.3* 8.3*  MG  --  1.7  --  2.2  --     Liver Function Tests:  Recent Labs Lab 10/10/15 0510 10/11/15 0300 10/12/15 0440 10/13/15 0240 10/14/15 0352  AST 56* 34 23 18 14*  ALT 42 31 25 20 15   ALKPHOS 48 46 54 45 46  BILITOT 0.9 0.9 0.9 0.7 0.2*  PROT 6.4* 6.6 6.4* 6.4* 6.4*  ALBUMIN 2.7* 2.6* 2.4* 2.2* 2.3*   No results for input(s): LIPASE, AMYLASE in the last 168 hours. No results for input(s): AMMONIA in the last 168 hours.  CBC:  Recent Labs Lab 10/11/15 1323 10/12/15 0440 10/13/15 0240 10/13/15 1418 10/14/15 0352  WBC 14.0* 11.0* 9.0 9.0 7.2  HGB 9.6* 9.0* 8.4* 9.1* 8.5*  HCT 28.7* 28.4* 26.7* 28.9* 27.0*  MCV 87.8 89.0 89.3 90.0 89.7  PLT 152 141* 150 192 184    INR:  Recent Labs Lab 10/10/15 0510 10/11/15 0300 10/12/15 0440 10/13/15 0240 10/14/15 0352  INR 1.23 1.33 1.36 1.32 1.41    Other results:  Imaging: No results found.   Medications:     Scheduled Medications: . amiodarone  100 mg Oral Daily  . cholecalciferol  1,000 Units  Oral Daily  . docusate sodium  100 mg Oral BID  . gabapentin  100 mg Oral QHS  . magnesium oxide  400 mg Oral Daily  . metoCLOPramide (REGLAN) injection  5 mg Intravenous 3 times per day  . pantoprazole  40 mg Oral Daily  . piperacillin-tazobactam (ZOSYN)  IV  3.375 g Intravenous Q8H  . warfarin  5 mg Oral ONCE-1800  . Warfarin - Pharmacist Dosing Inpatient   Does not apply q1800    Infusions: . heparin 1,200 Units/hr (10/14/15 1000)  . phenylephrine (NEO-SYNEPHRINE) Adult infusion Stopped (10/12/15 1200)    PRN Medications: morphine injection, ondansetron (ZOFRAN) IV, oxyCODONE-acetaminophen, traMADol   Assessment:   1. Chronic cholecystitis: s/p laparoscopic cholecystectomy.  2. Chronic systolic HF s/p LVAD HMII implantation. 3. PAF 4. HTN 5. NSVT 6. Hypomagnesemia 7. CKD stage  3  Plan/Discussion:    S/P Lap Cholecystectomy  S/P 1 U PRBCs intra op. Hemoglobin now stable.  She remains on heparin gtt until INR > 2 (1.4 today).   Continue amio 100 mg daily for her PAF.   MAP high today.  Can stop midodrine.  If BP remains stable today, to 2W tomorrow.    I reviewed the LVAD parameters from today, and compared the results to the patient's prior recorded data.  No programming changes were made.  The LVAD is functioning within specified parameters.  The patient performs LVAD self-test daily.  LVAD interrogation was negative for any significant power changes, alarms or PI events/speed drops.  LVAD equipment check completed and is in good working order.  Back-up equipment present.   LVAD education done on emergency procedures and precautions and reviewed exit site care.  Length of Stay: [place] NP-C  10/14/2015, 12:08 PM  VAD Team --- VAD ISSUES ONLY--- Pager [phone] (7am - 7am)  Advanced Heart Failure Team  Pager [phone] (M-F; 7a - 4p)  Please contact [facility] for night-coverage after hours (4p -7a ) and weekends

## 2015-10-14 NOTE — Progress Notes (Signed)
Pt not feeling as well right now. Requests to walk later. Encouraged her to walk with nursing. Will f/u Monday. Yves Dill CES, ACSM 3:18 PM 10/14/2015

## 2015-10-15 LAB — BASIC METABOLIC PANEL
Anion gap: 11 (ref 5–15)
BUN: <5 mg/dL — ABNORMAL LOW (ref 6–20)
CO2: 24 mmol/L (ref 22–32)
Calcium: 8.6 mg/dL — ABNORMAL LOW (ref 8.9–10.3)
Chloride: 106 mmol/L (ref 101–111)
Creatinine, Ser: 0.92 mg/dL (ref 0.44–1.00)
GFR calc Af Amer: >60 mL/min (ref >60)
GFR calc non Af Amer: 58 mL/min — ABNORMAL LOW (ref >60)
Glucose, Bld: 84 mg/dL (ref 65–99)
Potassium: 3.7 mmol/L (ref 3.5–5.1)
Sodium: 141 mmol/L (ref 135–145)

## 2015-10-15 LAB — CBC
HCT: 27.1 % — ABNORMAL LOW (ref 36.0–46.0)
Hemoglobin: 8.5 g/dL — ABNORMAL LOW (ref 12.0–15.0)
MCH: 28 pg (ref 26.0–34.0)
MCHC: 31.4 g/dL (ref 30.0–36.0)
MCV: 89.1 fL (ref 78.0–100.0)
Platelets: 184 10*3/uL (ref 150–400)
RBC: 3.04 MIL/uL — ABNORMAL LOW (ref 3.87–5.11)
RDW: 18.3 % — ABNORMAL HIGH (ref 11.5–15.5)
WBC: 7.6 10*3/uL (ref 4.0–10.5)

## 2015-10-15 LAB — HEPARIN LEVEL (UNFRACTIONATED)
Heparin Unfractionated: >2.2 IU/mL — ABNORMAL HIGH (ref 0.30–0.70)
Heparin Unfractionated: 0.36 IU/mL (ref 0.30–0.70)

## 2015-10-15 LAB — PROTIME-INR
INR: 1.76 — ABNORMAL HIGH (ref 0.00–1.49)
Prothrombin Time: 20.5 seconds — ABNORMAL HIGH (ref 11.6–15.2)

## 2015-10-15 LAB — LACTATE DEHYDROGENASE: LDH: 261 U/L — ABNORMAL HIGH (ref 98–192)

## 2015-10-15 MED ORDER — SPIRONOLACTONE 25 MG PO TABS
25.0000 mg | ORAL_TABLET | Freq: Every day | ORAL | Status: DC
  Administered 2015-10-15 – 2015-10-17 (×3): 25 mg via ORAL
  Filled 2015-10-15 (×3): qty 1

## 2015-10-15 MED ORDER — HYDRALAZINE HCL 25 MG PO TABS
25.0000 mg | ORAL_TABLET | Freq: Three times a day (TID) | ORAL | Status: DC
  Administered 2015-10-15 – 2015-10-17 (×6): 25 mg via ORAL
  Filled 2015-10-15 (×6): qty 1

## 2015-10-15 MED ORDER — WARFARIN SODIUM 2.5 MG PO TABS
2.5000 mg | ORAL_TABLET | Freq: Once | ORAL | Status: AC
  Administered 2015-10-15: 2.5 mg via ORAL
  Filled 2015-10-15: qty 1

## 2015-10-15 NOTE — Progress Notes (Addendum)
Patient ID: Brianna Hanson, female   DOB: [birth year], 81 y.o.   MRN: [id number]   HeartMate 2 Rounding Note  Subjective:   S/P Lap Cholecystectomy. Received 1 u PRBC interoperatively for bleeding. Todays hgb 8.5.  Off phenylephrine and midodrine now.  MAP 80s-100.    Feels good, good appetite.  Afebrile, normal WBCs. INR 1.76.   LVAD INTERROGATION:  HeartMate II LVAD:  Flow 4.0 liters/min, speed 9200, power 4.8, PI 7.5 No PI events.      Objective:    Vital Signs:   Temp:  [98.4 F (36.9 C)-98.5 F (36.9 C)] 98.5 F (36.9 C) (02/12 0722) Pulse Rate:  [72-91] 80 (02/12 1000) Resp:  [17-29] 17 (02/12 0900) BP: (107-123)/(74-95) 107/80 mmHg (02/12 0800) SpO2:  [92 %-100 %] 95 % (02/12 1000) Weight:  [151 lb 14.4 oz (68.9 kg)] 151 lb 14.4 oz (68.9 kg) (02/12 0600) Last BM Date: 10/09/15 Mean arterial Pressure 80s-100  Intake/Output:   Intake/Output Summary (Last 24 hours) at 10/15/15 1312 Last data filed at 10/15/15 1000  Gross per 24 hour  Intake    521 ml  Output    750 ml  Net   -229 ml     Physical Exam: GENERAL: In the bed.   NAD HEENT: normal  NECK: Supple, JVP 5-6 cm. No thyromegaly or nodule noted.  CARDIAC: LVAD hum present.  LUNGS: CTAB, no resp distress on 2 liters [place].  ABDOMEN: Tender, +distended in upper quadrants, no HSM. Steri strips in place from lap cholecystectomy.  + bowel sounds.    LVAD exit site: Dressing dry and intact. Stabilization device present and accurately applied. Driveline dressing changed weekly. EXTREMITIES: Warm and dry, no cyanosis, clubbing, rash, trace ankle edema.  NEUROLOGIC: Alert and oriented x 4. Gait steady. No aphasia. No dysarthria. Affect pleasant.   Telemetry: NSR 70s   Labs: Basic Metabolic Panel:  Recent Labs Lab 10/11/15 0300 10/12/15 0440 10/13/15 0240 10/14/15 0352 10/15/15 0336  NA 135 138 138 139 141  K 3.5 4.6 3.9 3.8 3.7  CL 104 104 105 104 106  CO2 23 25 25 25 24   GLUCOSE 109* 87 87  87 84  BUN 10 6 <5* <5* <5*  CREATININE 1.11* 0.96 0.93 0.88 0.92  CALCIUM 8.6* 8.6* 8.3* 8.3* 8.6*  MG 1.7  --  2.2  --   --     Liver Function Tests:  Recent Labs Lab 10/10/15 0510 10/11/15 0300 10/12/15 0440 10/13/15 0240 10/14/15 0352  AST 56* 34 23 18 14*  ALT 42 31 25 20 15   ALKPHOS 48 46 54 45 46  BILITOT 0.9 0.9 0.9 0.7 0.2*  PROT 6.4* 6.6 6.4* 6.4* 6.4*  ALBUMIN 2.7* 2.6* 2.4* 2.2* 2.3*   No results for input(s): LIPASE, AMYLASE in the last 168 hours. No results for input(s): AMMONIA in the last 168 hours.  CBC:  Recent Labs Lab 10/12/15 0440 10/13/15 0240 10/13/15 1418 10/14/15 0352 10/15/15 0336  WBC 11.0* 9.0 9.0 7.2 7.6  HGB 9.0* 8.4* 9.1* 8.5* 8.5*  HCT 28.4* 26.7* 28.9* 27.0* 27.1*  MCV 89.0 89.3 90.0 89.7 89.1  PLT 141* 150 192 184 184    INR:  Recent Labs Lab 10/12/15 0440 10/13/15 0240 10/14/15 0352 10/15/15 0337 10/15/15 0707  INR 1.36 1.32 1.41 >10.00* 1.76*    Other results:  Imaging: No results found.   Medications:     Scheduled Medications: . amiodarone  100 mg Oral Daily  . cholecalciferol  1,000 Units  Oral Daily  . docusate sodium  100 mg Oral BID  . gabapentin  100 mg Oral QHS  . hydrALAZINE  25 mg Oral 3 times per day  . magnesium oxide  400 mg Oral Daily  . metoCLOPramide (REGLAN) injection  5 mg Intravenous 3 times per day  . pantoprazole  40 mg Oral Daily  . spironolactone  25 mg Oral Daily  . warfarin  2.5 mg Oral ONCE-1800  . Warfarin - Pharmacist Dosing Inpatient   Does not apply q1800    Infusions: . heparin 1,200 Units/hr (10/15/15 0930)  . phenylephrine (NEO-SYNEPHRINE) Adult infusion Stopped (10/12/15 1200)    PRN Medications: morphine injection, ondansetron (ZOFRAN) IV, oxyCODONE-acetaminophen, traMADol   Assessment:   1. Chronic cholecystitis: s/p laparoscopic cholecystectomy.  2. Chronic systolic HF s/p LVAD HMII implantation. 3. PAF 4. HTN 5. NSVT 6. Hypomagnesemia 7. CKD stage  3  Plan/Discussion:    S/P Lap Cholecystectomy  S/P 1 U PRBCs intra op. Hemoglobin now stable.  She remains on heparin gtt until INR > 2 (1.76 today).   Continue amio 100 mg daily for her PAF.   MAP elevated off midodrine, will restart hydralazine at 25 mg tid and spironolactone 25 daily.   Transfer to 2W stepdown.   I reviewed the LVAD parameters from today, and compared the results to the patient's prior recorded data.  No programming changes were made.  The LVAD is functioning within specified parameters.  The patient performs LVAD self-test daily.  LVAD interrogation was negative for any significant power changes, alarms or PI events/speed drops.  LVAD equipment check completed and is in good working order.  Back-up equipment present.   LVAD education done on emergency procedures and precautions and reviewed exit site care.  Length of Stay: [place]  10/15/2015, 1:12 PM  VAD Team --- VAD ISSUES ONLY--- Pager [phone] (7am - 7am)  Advanced Heart Failure Team  Pager [phone] (M-F; 7a - 4p)  Please contact [facility] for night-coverage after hours (4p -7a ) and weekends

## 2015-10-15 NOTE — Progress Notes (Addendum)
ANTICOAGULATION CONSULT NOTE - Follow Up Consult  Pharmacy Consult for heparin Indication: Heparin bridge with LVAD  No Known Allergies  Patient Measurements: Height: 5' (152.4 cm) Weight: 151 lb 14.4 oz (68.9 kg) IBW/kg (Calculated) : 45.5 Heparin Dosing Weight: 61.75 kg  Vital Signs: Temp: 98.5 F (36.9 C) (02/12 0722) Temp Source: Oral (02/12 0722) BP: 123/95 mmHg (02/12 0300) Pulse Rate: 72 (02/12 0700)  Labs:  Recent Labs  10/13/15 0240  10/13/15 1418 10/14/15 0352 10/15/15 0336 10/15/15 0337 10/15/15 0707  HGB 8.4*  --  9.1* 8.5* 8.5*  --   --   HCT 26.7*  --  28.9* 27.0* 27.1*  --   --   PLT 150  --  192 184 184  --   --   LABPROT 16.5*  --   --  17.3*  --  >90.0* 20.5*  INR 1.32  --   --  1.41  --  >10.00* 1.76*  HEPARINUNFRC 0.15*  < >  --  0.34  --  >2.20* 0.36  CREATININE 0.93  --   --  0.88 0.92  --   --   < > = values in this interval not displayed.  Estimated Creatinine Clearance: 43.7 mL/min (by C-G formula based on Cr of 0.92).  Assessment: Brianna Hanson is a 81 yo F admitted 10/03/2015 for lap chole with C-drain removal which was done on 2/6. She underwent implantation of the HeartMate II LVAD on 04/06/13 for DT.  First set of labs INR > 10 and HL > 2.2, which were incorrect and redrawn. INR 1.76 this am, subtherapeutic however rising appropriately. Improved ileus. Hgb 8.5, plt wnl. No bleeding noted, also on heparin bridge. HL continues to be therapeutic at 0.36 on 1200 units/h. LDH 261. Day 4 heparin/warfarin bridge. Will restart home dose given bleeding risk and 5 mg boosted doses x 3 and likely see effects of 5 mg doses over next few days. Pt also on PTA amiodarone.  PTA warfarin 2.5 mg qday, except 5 mg qWednesdays.  Goal of Therapy:  INR 2-3 Heparin level 0.2-0.4 units/ml Monitor platelets by anticoagulation protocol: Yes   Plan:  Continue heparin 1200 units/hr Warfarin 2.5mg  x 1 tonight Daily HL, CBC, INR Monitor s/sx of  bleeding   Heloise Ochoa, Pharm.D., BCPS PGY2 Cardiology Pharmacy Resident Pager: [phone] 10/15/2015 9:17 AM

## 2015-10-16 LAB — CBC
HCT: 26.9 % — ABNORMAL LOW (ref 36.0–46.0)
Hemoglobin: 8.4 g/dL — ABNORMAL LOW (ref 12.0–15.0)
MCH: 28 pg (ref 26.0–34.0)
MCHC: 31.2 g/dL (ref 30.0–36.0)
MCV: 89.7 fL (ref 78.0–100.0)
Platelets: 208 10*3/uL (ref 150–400)
RBC: 3 MIL/uL — ABNORMAL LOW (ref 3.87–5.11)
RDW: 18.3 % — ABNORMAL HIGH (ref 11.5–15.5)
WBC: 6.9 10*3/uL (ref 4.0–10.5)

## 2015-10-16 LAB — BASIC METABOLIC PANEL
Anion gap: 10 (ref 5–15)
BUN: <5 mg/dL — ABNORMAL LOW (ref 6–20)
CO2: 25 mmol/L (ref 22–32)
Calcium: 8.6 mg/dL — ABNORMAL LOW (ref 8.9–10.3)
Chloride: 105 mmol/L (ref 101–111)
Creatinine, Ser: 0.89 mg/dL (ref 0.44–1.00)
GFR calc Af Amer: >60 mL/min (ref >60)
GFR calc non Af Amer: >60 mL/min (ref >60)
Glucose, Bld: 87 mg/dL (ref 65–99)
Potassium: 3 mmol/L — ABNORMAL LOW (ref 3.5–5.1)
Sodium: 140 mmol/L (ref 135–145)

## 2015-10-16 LAB — PROTIME-INR
INR: 1.83 — ABNORMAL HIGH (ref 0.00–1.49)
Prothrombin Time: 21.1 seconds — ABNORMAL HIGH (ref 11.6–15.2)

## 2015-10-16 LAB — HEPARIN LEVEL (UNFRACTIONATED): Heparin Unfractionated: 0.35 IU/mL (ref 0.30–0.70)

## 2015-10-16 LAB — LACTATE DEHYDROGENASE: LDH: 247 U/L — ABNORMAL HIGH (ref 98–192)

## 2015-10-16 MED ORDER — LOSARTAN POTASSIUM 25 MG PO TABS
25.0000 mg | ORAL_TABLET | Freq: Every day | ORAL | Status: DC
  Administered 2015-10-16 – 2015-10-17 (×2): 25 mg via ORAL
  Filled 2015-10-16 (×2): qty 1

## 2015-10-16 MED ORDER — POTASSIUM CHLORIDE CRYS ER 20 MEQ PO TBCR
40.0000 meq | EXTENDED_RELEASE_TABLET | Freq: Two times a day (BID) | ORAL | Status: AC
  Administered 2015-10-16 (×2): 40 meq via ORAL
  Filled 2015-10-16 (×2): qty 2

## 2015-10-16 MED ORDER — WARFARIN SODIUM 4 MG PO TABS
4.0000 mg | ORAL_TABLET | Freq: Once | ORAL | Status: AC
  Administered 2015-10-16: 4 mg via ORAL
  Filled 2015-10-16: qty 1

## 2015-10-16 NOTE — Progress Notes (Signed)
Attempted to call 2W unit x3 with no response. Will try to contact charge RN. Richardean Sale, RN

## 2015-10-16 NOTE — Care Management Important Message (Signed)
Important Message  Patient Details  Name: MICHON VLAHAKIS MRN: [id number] Date of Birth: 06-May-1935   Medicare Important Message Given:  Yes    Manha Amato P Cyprian Gongaware 10/16/2015, 2:01 PM

## 2015-10-16 NOTE — Progress Notes (Signed)
CARDIAC REHAB PHASE I   PRE:  Rate/Rhythm: 81 SR  BP:  Supine:   Sitting: 112 MAP dopplered  Standing:    SaO2: 96%RA  MODE:  Ambulation: 350 ft   POST:  Rate/Rhythm: 127  BP:  Supine:   Sitting: 118 MAP dopplered  Standing:    SaO2: 93%RA        RN stated can use BP dinamapp and 96 MAP with that 1405-1446 Pt assisted to bathroom and then walked 350 ft on RA with rolling walker. No difficulty switching back and forth with batteries. To power source after walk. To recliner. Notified RN of elevated MAPs that I dopplered. Was told that dinamapp BPs have been lower. She toook BP and MAP at 96. Pt tolerated well.   Graylon Good, RN BSN  10/16/2015 2:40 PM

## 2015-10-16 NOTE — Progress Notes (Signed)
ANTICOAGULATION CONSULT NOTE - Follow Up Consult  Pharmacy Consult for heparin Indication: Heparin bridge with LVAD  No Known Allergies  Patient Measurements: Height: 5' (152.4 cm) Weight: 165 lb 1.6 oz (74.889 kg) IBW/kg (Calculated) : 45.5 Heparin Dosing Weight: 61.75 kg  Vital Signs: Temp: 98.2 F (36.8 C) (02/13 0359) Temp Source: Oral (02/13 0359) BP: 114/98 mmHg (02/13 0400)  Labs:  Recent Labs  10/14/15 0352 10/15/15 0336 10/15/15 0337 10/15/15 0707 10/16/15 0348  HGB 8.5* 8.5*  --   --  8.4*  HCT 27.0* 27.1*  --   --  26.9*  PLT 184 184  --   --  208  LABPROT 17.3*  --  >90.0* 20.5* 21.1*  INR 1.41  --  >10.00* 1.76* 1.83*  HEPARINUNFRC 0.34  --  >2.20* 0.36 0.35  CREATININE 0.88 0.92  --   --  0.89    Estimated Creatinine Clearance: 47.1 mL/min (by C-G formula based on Cr of 0.89).  Assessment: Brianna Hanson is a 81 yo F admitted 10/03/2015 for lap chole with C-drain removal which was done on 2/6. She underwent implantation of the HeartMate II LVAD on 04/06/13 for DT.  INR 1.8 this am, subtherapeutic however rising appropriately. Improved ileus. CBC stable. No bleeding noted, will stop heparin today. Likely home tomorrow.  PTA warfarin 2.5 mg qday, except 5 mg qWednesdays.  Goal of Therapy:  INR 2-3 Monitor platelets by anticoagulation protocol: Yes   Plan:  D/c heparin Warfarin 4mg  x 1 tonight Daily CBC, INR Monitor s/sx of bleeding  Erin Hearing PharmD., BCPS Clinical Pharmacist Pager [phone] 10/16/2015 8:37 AM

## 2015-10-16 NOTE — Progress Notes (Signed)
Patient ID: Brianna Hanson, female   DOB: 03-Nov-1934, 81 y.o.   MRN: [id number]   HeartMate 2 Rounding Note  Subjective:   S/P Lap Cholecystectomy. Received 1 u PRBC interoperatively for bleeding. Todays hgb 8.5.  Off phenylephrine and midodrine now.    MAP 80s-100.  Afebrile, normal WBCs. INR 1.83.   Denies pain. Mild abdominal pain.   LVAD INTERROGATION:  HeartMate II LVAD:  Flow 4.3 liters/min, speed 9200, power 5, PI 8 No PI events.      Objective:    Vital Signs:   Temp:  [98 F (36.7 C)-98.2 F (36.8 C)] 98.2 F (36.8 C) (02/13 0359) Pulse Rate:  [79-91] 80 (02/12 1400) Resp:  [14-33] 23 (02/13 0500) BP: (105-123)/(75-98) 114/98 mmHg (02/13 0400) SpO2:  [92 %-99 %] 98 % (02/13 0400) Weight:  [165 lb 1.6 oz (74.889 kg)] 165 lb 1.6 oz (74.889 kg) (02/13 0403) Last BM Date: 10/15/15 Mean arterial Pressure 90-100s  Intake/Output:   Intake/Output Summary (Last 24 hours) at 10/16/15 0827 Last data filed at 10/16/15 0700  Gross per 24 hour  Intake    636 ml  Output    400 ml  Net    236 ml     Physical Exam: GENERAL: In chair.  NAD HEENT: normal  NECK: Supple, JVP 5-6 cm. No thyromegaly or nodule noted.  CARDIAC: LVAD hum present.  LUNGS: CTAB, no resp distress on 2 liters [place].  ABDOMEN: Tender, +distended in upper quadrants, no HSM. Steri strips in place from lap cholecystectomy.  + bowel sounds.    LVAD exit site: Dressing dry and intact. Stabilization device present and accurately applied. Driveline dressing changed weekly. EXTREMITIES: Warm and dry, no cyanosis, clubbing, rash, trace ankle edema.  NEUROLOGIC: Alert and oriented x 4. Gait steady. No aphasia. No dysarthria. Affect pleasant.   Telemetry: NSR 70s   Labs: Basic Metabolic Panel:  Recent Labs Lab 10/11/15 0300 10/12/15 0440 10/13/15 0240 10/14/15 0352 10/15/15 0336 10/16/15 0348  NA 135 138 138 139 141 140  K 3.5 4.6 3.9 3.8 3.7 3.0*  CL 104 104 105 104 106 105  CO2 23 25  25 25 24 25   GLUCOSE 109* 87 87 87 84 87  BUN 10 6 <5* <5* <5* <5*  CREATININE 1.11* 0.96 0.93 0.88 0.92 0.89  CALCIUM 8.6* 8.6* 8.3* 8.3* 8.6* 8.6*  MG 1.7  --  2.2  --   --   --     Liver Function Tests:  Recent Labs Lab 10/10/15 0510 10/11/15 0300 10/12/15 0440 10/13/15 0240 10/14/15 0352  AST 56* 34 23 18 14*  ALT 42 31 25 20 15   ALKPHOS 48 46 54 45 46  BILITOT 0.9 0.9 0.9 0.7 0.2*  PROT 6.4* 6.6 6.4* 6.4* 6.4*  ALBUMIN 2.7* 2.6* 2.4* 2.2* 2.3*   No results for input(s): LIPASE, AMYLASE in the last 168 hours. No results for input(s): AMMONIA in the last 168 hours.  CBC:  Recent Labs Lab 10/13/15 0240 10/13/15 1418 10/14/15 0352 10/15/15 0336 10/16/15 0348  WBC 9.0 9.0 7.2 7.6 6.9  HGB 8.4* 9.1* 8.5* 8.5* 8.4*  HCT 26.7* 28.9* 27.0* 27.1* 26.9*  MCV 89.3 90.0 89.7 89.1 89.7  PLT 150 192 184 184 208    INR:  Recent Labs Lab 10/13/15 0240 10/14/15 0352 10/15/15 0337 10/15/15 0707 10/16/15 0348  INR 1.32 1.41 >10.00* 1.76* 1.83*    Other results:  Imaging: No results found.   Medications:     Scheduled  Medications: . amiodarone  100 mg Oral Daily  . cholecalciferol  1,000 Units Oral Daily  . docusate sodium  100 mg Oral BID  . gabapentin  100 mg Oral QHS  . hydrALAZINE  25 mg Oral 3 times per day  . magnesium oxide  400 mg Oral Daily  . metoCLOPramide (REGLAN) injection  5 mg Intravenous 3 times per day  . pantoprazole  40 mg Oral Daily  . spironolactone  25 mg Oral Daily  . Warfarin - Pharmacist Dosing Inpatient   Does not apply q1800    Infusions: . heparin 1,200 Units/hr (10/16/15 0700)  . phenylephrine (NEO-SYNEPHRINE) Adult infusion Stopped (10/12/15 1200)    PRN Medications: morphine injection, ondansetron (ZOFRAN) IV, oxyCODONE-acetaminophen, traMADol   Assessment:   1. Chronic cholecystitis: s/p laparoscopic cholecystectomy.  2. Chronic systolic HF s/p LVAD HMII implantation. 3. PAF 4. HTN 5. NSVT 6.  Hypomagnesemia 7. CKD stage 3  Plan/Discussion:    S/P Lap Cholecystectomy  S/P 1 U PRBCs intra op. Hemoglobin now stable.  INR 1.8 . Stop heparin.    Continue amio 100 mg daily for her PAF.   MAP running high. Continue hydralazine at 25 mg tid and spironolactone 25 daily. Add 25 mg losartan daily. Will need to slowly add back. (Prior to admit she was on losartan 50 mg daily, hydralazine 50 mg tid, carvedilol 6.25 mg twice, and 25 mg spiro)   Transfer to 2west.   I reviewed the LVAD parameters from today, and compared the results to the patient's prior recorded data.  No programming changes were made.  The LVAD is functioning within specified parameters.  The patient performs LVAD self-test daily.  LVAD interrogation was negative for any significant power changes, alarms or PI events/speed drops.  LVAD equipment check completed and is in good working order.  Back-up equipment present.   LVAD education done on emergency procedures and precautions and reviewed exit site care.  Home 24-48 hours  Length of Stay: 13  Amy Clegg NP-C 10/16/2015, 8:27 AM  VAD Team --- VAD ISSUES ONLY--- Pager [phone] (7am - 7am)  Advanced Heart Failure Team  Pager [phone] (M-F; 7a - 4p)  Please contact [facility] for night-coverage after hours (4p -7a ) and weekends    Patient seen and examined with Darrick Grinder, NP. We discussed all aspects of the encounter. I agree with the assessment and plan as stated above.   She continues to improve. Still with mild ab pain but ileus resolving. Passing gas and having BMs. INR now 1.8. Stop heparin. Hgb stable. Agree with adding losartan for elevated MAPs. Can transfer to 2W. Continue PT/CR.   Shiloh Swopes,MD 6:26 PM

## 2015-10-16 NOTE — Progress Notes (Signed)
7 Days Post-Op  Subjective: No complaints, tol diet, having bowel function  Objective: Vital signs in last 24 hours: Temp:  [98 F (36.7 C)-98.2 F (36.8 C)] 98.2 F (36.8 C) (02/13 0359) Pulse Rate:  [77-91] 80 (02/12 1400) Resp:  [14-33] 23 (02/13 0500) BP: (105-123)/(75-98) 114/98 mmHg (02/13 0400) SpO2:  [92 %-99 %] 98 % (02/13 0400) Weight:  [74.889 kg (165 lb 1.6 oz)] 74.889 kg (165 lb 1.6 oz) (02/13 0403) Last BM Date: 10/15/15  Intake/Output from previous day: 02/12 0701 - 02/13 0700 In: 648 [P.O.:360; I.V.:288] Out: 400 [Urine:400] Intake/Output this shift:    GI: soft approp tender incisions clean  Lab Results:   Recent Labs  10/15/15 0336 10/16/15 0348  WBC 7.6 6.9  HGB 8.5* 8.4*  HCT 27.1* 26.9*  PLT 184 208   BMET  Recent Labs  10/15/15 0336 10/16/15 0348  NA 141 140  K 3.7 3.0*  CL 106 105  CO2 24 25  GLUCOSE 84 87  BUN <5* <5*  CREATININE 0.92 0.89  CALCIUM 8.6* 8.6*   PT/INR  Recent Labs  10/15/15 0707 10/16/15 0348  LABPROT 20.5* 21.1*  INR 1.76* 1.83*   ABG No results for input(s): PHART, HCO3 in the last 72 hours.  Invalid input(s): PCO2, PO2  Studies/Results: No results found.  Anti-infectives: Anti-infectives    Start     Dose/Rate Route Frequency Ordered Stop   10/09/15 1800  piperacillin-tazobactam (ZOSYN) IVPB 3.375 g  Status:  Discontinued     3.375 g 12.5 mL/hr over 240 Minutes Intravenous Every 8 hours 10/09/15 1633 10/15/15 1312   10/05/15 0600  ceFAZolin (ANCEF) IVPB 2 g/50 mL premix  Status:  Discontinued     2 g 100 mL/hr over 30 Minutes Intravenous On call to O.R. 10/04/15 1243 10/05/15 0857   10/03/15 2000  ciprofloxacin (CIPRO) tablet 500 mg  Status:  Discontinued     500 mg Oral 2 times daily 10/03/15 1345 10/09/15 1627      Assessment/Plan: POD 7 lap chole  Doing well, continue per cardiology  [facility] 10/16/2015

## 2015-10-17 LAB — CBC
HCT: 28.8 % — ABNORMAL LOW (ref 36.0–46.0)
Hemoglobin: 8.7 g/dL — ABNORMAL LOW (ref 12.0–15.0)
MCH: 27.7 pg (ref 26.0–34.0)
MCHC: 30.2 g/dL (ref 30.0–36.0)
MCV: 91.7 fL (ref 78.0–100.0)
Platelets: 221 10*3/uL (ref 150–400)
RBC: 3.14 MIL/uL — ABNORMAL LOW (ref 3.87–5.11)
RDW: 18.4 % — ABNORMAL HIGH (ref 11.5–15.5)
WBC: 6.6 10*3/uL (ref 4.0–10.5)

## 2015-10-17 LAB — PROTIME-INR
INR: >10 (ref 0.00–1.49)
INR: 1.78 — ABNORMAL HIGH (ref 0.00–1.49)
Prothrombin Time: >90 seconds — ABNORMAL HIGH (ref 11.6–15.2)
Prothrombin Time: 20.7 seconds — ABNORMAL HIGH (ref 11.6–15.2)

## 2015-10-17 LAB — LACTATE DEHYDROGENASE: LDH: 285 U/L — ABNORMAL HIGH (ref 98–192)

## 2015-10-17 MED ORDER — POTASSIUM CHLORIDE CRYS ER 20 MEQ PO TBCR: 40.0000 meq | EXTENDED_RELEASE_TABLET | Freq: Two times a day (BID) | ORAL | Status: DC

## 2015-10-17 MED ORDER — POTASSIUM CHLORIDE CRYS ER 20 MEQ PO TBCR
40.0000 meq | EXTENDED_RELEASE_TABLET | Freq: Once | ORAL | Status: AC
  Administered 2015-10-17: 40 meq via ORAL
  Filled 2015-10-17: qty 2

## 2015-10-17 MED ORDER — LOSARTAN POTASSIUM 50 MG PO TABS: 25.0000 mg | ORAL_TABLET | Freq: Every day | ORAL | Status: DC

## 2015-10-17 MED ORDER — WARFARIN SODIUM 3 MG PO TABS
6.0000 mg | ORAL_TABLET | Freq: Once | ORAL | Status: AC
  Administered 2015-10-17: 6 mg via ORAL
  Filled 2015-10-17: qty 2

## 2015-10-17 NOTE — Discharge Instructions (Signed)
Information on my medicine - Coumadin   (Warfarin)  This medication education was reviewed with me or my healthcare representative as part of my discharge preparation.  The pharmacist that spoke with me during my hospital stay was:  Georgina Peer, [facility]  Why was Coumadin prescribed for you? Coumadin was prescribed for you because you have a blood clot or a medical condition that can cause an increased risk of forming blood clots. Blood clots can cause serious health problems by blocking the flow of blood to the heart, lung, or brain. Coumadin can prevent harmful blood clots from forming. As a reminder your indication for Coumadin is:   Blood Clot Prevention After Heart Pump Surgery  What test will check on my response to Coumadin? While on Coumadin (warfarin) you will need to have an INR test regularly to ensure that your dose is keeping you in the desired range. The INR (international normalized ratio) number is calculated from the result of the laboratory test called prothrombin time (PT).  If an INR APPOINTMENT HAS NOT ALREADY BEEN MADE FOR YOU please schedule an appointment to have this lab work done by your health care provider within 7 days. Your INR goal is usually a number between: 2-2.5  What  do you need to  know  About  COUMADIN? Take Coumadin (warfarin) exactly as prescribed by your healthcare provider about the same time each day.  DO NOT stop taking without talking to the doctor who prescribed the medication.  Stopping without other blood clot prevention medication to take the place of Coumadin may increase your risk of developing a new clot or stroke.  Get refills before you run out.  What do you do if you miss a dose? If you miss a dose, take it as soon as you remember on the same day then continue your regularly scheduled regimen the next day.  Do not take two doses of Coumadin at the same time.  Important Safety Information A possible side effect of Coumadin (Warfarin) is  an increased risk of bleeding. You should call your healthcare provider right away if you experience any of the following: ? Bleeding from an injury or your nose that does not stop. ? Unusual colored urine (red or dark brown) or unusual colored stools (red or black). ? Unusual bruising for unknown reasons. ? A serious fall or if you hit your head (even if there is no bleeding).  Some foods or medicines interact with Coumadin (warfarin) and might alter your response to warfarin. To help avoid this: ? Eat a balanced diet, maintaining a consistent amount of Vitamin K. ? Notify your provider about major diet changes you plan to make. ? Avoid alcohol or limit your intake to 1 drink for women and 2 drinks for men per day. (1 drink is 5 oz. wine, 12 oz. beer, or 1.5 oz. liquor.)  Make sure that ANY health care provider who prescribes medication for you knows that you are taking Coumadin (warfarin).  Also make sure the healthcare provider who is monitoring your Coumadin knows when you have started a new medication including herbals and non-prescription products.  Coumadin (Warfarin)  Major Drug Interactions  Increased Warfarin Effect Decreased Warfarin Effect  Alcohol (large quantities) Antibiotics (esp. Septra/Bactrim, Flagyl, Cipro) Amiodarone (Cordarone) Aspirin (ASA) Cimetidine (Tagamet) Megestrol (Megace) NSAIDs (ibuprofen, naproxen, etc.) Piroxicam (Feldene) Propafenone (Rythmol SR) Propranolol (Inderal) Isoniazid (INH) Posaconazole (Noxafil) Barbiturates (Phenobarbital) Carbamazepine (Tegretol) Chlordiazepoxide (Librium) Cholestyramine (Questran) Griseofulvin Oral Contraceptives Rifampin Sucralfate (Carafate) Vitamin K  Coumadin (Warfarin) Major Herbal Interactions  Increased Warfarin Effect Decreased Warfarin Effect  Garlic Ginseng Ginkgo biloba Coenzyme Q10 Green tea St. Johns wort    Coumadin (Warfarin) FOOD Interactions  Eat a consistent number of servings per  week of foods HIGH in Vitamin K (1 serving =  cup)  Collards (cooked, or boiled & drained) Kale (cooked, or boiled & drained) Mustard greens (cooked, or boiled & drained) Parsley *serving size only =  cup Spinach (cooked, or boiled & drained) Swiss chard (cooked, or boiled & drained) Turnip greens (cooked, or boiled & drained)  Eat a consistent number of servings per week of foods MEDIUM-HIGH in Vitamin K (1 serving = 1 cup)  Asparagus (cooked, or boiled & drained) Broccoli (cooked, boiled & drained, or raw & chopped) Brussel sprouts (cooked, or boiled & drained) *serving size only =  cup Lettuce, raw (green leaf, endive, romaine) Spinach, raw Turnip greens, raw & chopped   These websites have more information on Coumadin (warfarin):  FailFactory.se; VeganReport.com.au;

## 2015-10-17 NOTE — Progress Notes (Addendum)
Patient ID: Brianna Hanson, female   DOB: November 19, 1934, 81 y.o.   MRN: [id number]   HeartMate 2 Rounding Note  Subjective:   S/P Lap Cholecystectomy. Received 1 u PRBC interoperatively for bleeding. Todays hgb 8.5.  Off phenylephrine and midodrine now.    MAP 80s-100.  Afebrile, normal WBCs. INR 1.78. Yesterday losaran and spiro adde.   Denies pain. Denies SOB   LVAD INTERROGATION:  HeartMate II LVAD:  Flow 3.6  liters/min, speed 9200, power 5, PI 7.7  No PI events.      Objective:    Vital Signs:   Temp:  [97.4 F (36.3 C)-98.5 F (36.9 C)] 98.5 F (36.9 C) (02/14 0508) Pulse Rate:  [80-89] 86 (02/14 0508) Resp:  [16-26] 18 (02/14 0508) BP: (96-128)/(81-91) 107/81 mmHg (02/14 0821) SpO2:  [93 %-100 %] 93 % (02/14 0508) Weight:  [165 lb 3.2 oz (74.934 kg)] 165 lb 3.2 oz (74.934 kg) (02/14 0508) Last BM Date: 10/15/15 Mean arterial Pressure 90-100s  Intake/Output:   Intake/Output Summary (Last 24 hours) at 10/17/15 0917 Last data filed at 10/16/15 1600  Gross per 24 hour  Intake    720 ml  Output      0 ml  Net    720 ml     Physical Exam: GENERAL: In chair.  NAD HEENT: normal  NECK: Supple, JVP 5-6 cm. No thyromegaly or nodule noted.  CARDIAC: LVAD hum present.  LUNGS: CTAB, no resp distress on 2 liters [place].  ABDOMEN: Tender, +distended in upper quadrants, no HSM. Steri strips in place from lap cholecystectomy.  + bowel sounds.    LVAD exit site: Dressing dry and intact. Stabilization device present and accurately applied. Driveline dressing changed weekly. EXTREMITIES: Warm and dry, no cyanosis, clubbing, rash, trace ankle edema.  NEUROLOGIC: Alert and oriented x 4. Gait steady. No aphasia. No dysarthria. Affect pleasant.   Telemetry: NSR 70s   Labs: Basic Metabolic Panel:  Recent Labs Lab 10/11/15 0300 10/12/15 0440 10/13/15 0240 10/14/15 0352 10/15/15 0336 10/16/15 0348  NA 135 138 138 139 141 140  K 3.5 4.6 3.9 3.8 3.7 3.0*  CL 104 104  105 104 106 105  CO2 23 25 25 25 24 25   GLUCOSE 109* 87 87 87 84 87  BUN 10 6 <5* <5* <5* <5*  CREATININE 1.11* 0.96 0.93 0.88 0.92 0.89  CALCIUM 8.6* 8.6* 8.3* 8.3* 8.6* 8.6*  MG 1.7  --  2.2  --   --   --     Liver Function Tests:  Recent Labs Lab 10/11/15 0300 10/12/15 0440 10/13/15 0240 10/14/15 0352  AST 34 23 18 14*  ALT 31 25 20 15   ALKPHOS 46 54 45 46  BILITOT 0.9 0.9 0.7 0.2*  PROT 6.6 6.4* 6.4* 6.4*  ALBUMIN 2.6* 2.4* 2.2* 2.3*   No results for input(s): LIPASE, AMYLASE in the last 168 hours. No results for input(s): AMMONIA in the last 168 hours.  CBC:  Recent Labs Lab 10/13/15 1418 10/14/15 0352 10/15/15 0336 10/16/15 0348 10/17/15 0335  WBC 9.0 7.2 7.6 6.9 6.6  HGB 9.1* 8.5* 8.5* 8.4* 8.7*  HCT 28.9* 27.0* 27.1* 26.9* 28.8*  MCV 90.0 89.7 89.1 89.7 91.7  PLT 192 184 184 208 221    INR:  Recent Labs Lab 10/14/15 0352 10/15/15 0337 10/15/15 0707 10/16/15 0348 10/17/15 0335  INR 1.41 >10.00* 1.76* 1.83* 1.78*    Other results:  Imaging: No results found.   Medications:     Scheduled  Medications: . amiodarone  100 mg Oral Daily  . cholecalciferol  1,000 Units Oral Daily  . gabapentin  100 mg Oral QHS  . hydrALAZINE  25 mg Oral 3 times per day  . losartan  25 mg Oral Daily  . magnesium oxide  400 mg Oral Daily  . pantoprazole  40 mg Oral Daily  . potassium chloride  40 mEq Oral Once  . spironolactone  25 mg Oral Daily  . warfarin  6 mg Oral Once  . Warfarin - Pharmacist Dosing Inpatient   Does not apply q1800    Infusions: . phenylephrine (NEO-SYNEPHRINE) Adult infusion Stopped (10/12/15 1200)    PRN Medications: morphine injection, ondansetron (ZOFRAN) IV, oxyCODONE-acetaminophen, traMADol   Assessment:   1. Chronic cholecystitis: s/p laparoscopic cholecystectomy.  2. Chronic systolic HF s/p LVAD HMII implantation. 3. PAF 4. HTN 5. NSVT 6. Hypomagnesemia 7. CKD stage 3  Plan/Discussion:    S/P Lap  Cholecystectomy  S/P 1 U PRBCs intra op. Hemoglobin now stable.  INR 1.8 . Stop heparin.    Continue amio 100 mg daily for her PAF.   MAP running high. Increase  hydralazine at 50 mg tid , continue pironolactone 25 daily. Increase losartan to 50 mg daily. Will need to slowly add back if needed as an outpatient.  (Prior to admit she was on losartan 50 mg daily, hydralazine 50 mg tid, carvedilol 6.25 mg twice, and 25 mg spiro)   INR 1.78. Give dose of coumadin prior to discharge.   Home today.   I reviewed the LVAD parameters from today, and compared the results to the patient's prior recorded data.  No programming changes were made.  The LVAD is functioning within specified parameters.  The patient performs LVAD self-test daily.  LVAD interrogation was negative for any significant power changes, alarms or PI events/speed drops.  LVAD equipment check completed and is in good working order.  Back-up equipment present.   LVAD education done on emergency procedures and precautions and reviewed exit site care.  Home 24-48 hours  Length of Stay: [place] NP-C 10/17/2015, 9:17 AM  VAD Team --- VAD ISSUES ONLY--- Pager [phone] (7am - 7am)  Advanced Heart Failure Team  Pager [phone] (M-F; 7a - 4p)  Please contact [facility] for night-coverage after hours (4p -7a ) and weekends   Patient seen and examined with Darrick Grinder, NP. We discussed all aspects of the encounter. I agree with the assessment and plan as stated above.   She is much improved. Eager to go home. INR 1.8. Eating well. Hgb stable. VAD parameters stable.   [name], Daniel,MD 9:07 AM

## 2015-10-17 NOTE — Progress Notes (Signed)
ANTICOAGULATION CONSULT NOTE - Follow Up Consult  Pharmacy Consult for heparin/warfarin Indication: Heparin bridge with LVAD  No Known Allergies  Patient Measurements: Height: 5' (152.4 cm) Weight: 165 lb 3.2 oz (74.934 kg) IBW/kg (Calculated) : 45.5 Heparin Dosing Weight: 61.75 kg  Vital Signs: Temp: 98.5 F (36.9 C) (02/14 0508) Temp Source: Oral (02/14 0508) BP: 107/81 mmHg (02/14 0821) Pulse Rate: 86 (02/14 0508)  Labs:  Recent Labs  10/15/15 0336  10/15/15 0337 10/15/15 0707 10/16/15 0348 10/17/15 0335  HGB 8.5*  --   --   --  8.4* 8.7*  HCT 27.1*  --   --   --  26.9* 28.8*  PLT 184  --   --   --  208 221  LABPROT  --   < > >90.0* 20.5* 21.1* 20.7*  INR  --   < > >10.00* 1.76* 1.83* 1.78*  HEPARINUNFRC  --   --  >2.20* 0.36 0.35  --   CREATININE 0.92  --   --   --  0.89  --   < > = values in this interval not displayed.  Estimated Creatinine Clearance: 47.1 mL/min (by C-G formula based on Cr of 0.89).  Assessment: Ms. Starke is a 81 yo F admitted 10/03/2015 for lap chole with C-drain removal which was done on 2/6. She underwent implantation of the HeartMate II LVAD on 04/06/13 for DT.  INR 1.78 this am, subtherapeutic, dipped overnight will give extra tonight then resume home dose. Ileus resolved now with multiple bm yesterday. CBC stable. No bleeding noted.  PTA warfarin 2.5 mg qday, except 5 mg qWednesdays.  Goal of Therapy:  INR 2-2.5 Monitor platelets by anticoagulation protocol: Yes   Plan:  Warfarin 6mg  x 1 tonight Daily CBC, INR Monitor s/sx of bleeding Home today - resume home regimen 2/15  Erin Hearing PharmD., BCPS Clinical Pharmacist Pager [phone] 10/17/2015 9:16 AM

## 2015-10-17 NOTE — Progress Notes (Signed)
Patient ID: Brianna Hanson, female   DOB: [birth year], 81 y.o.   MRN: [id number] POD 8 lap chole, doing well, will continue to follow

## 2015-10-17 NOTE — Care Management Note (Signed)
Case Management Note Previous CM note initiated by Donne Anon RN, CM  Patient Details  Name: Brianna Hanson MRN: [id number] Date of Birth: August 28, 1935  Subjective/Objective:   Adm w gb surg                 Action/Plan: lives w fam, pcp dr Ronnald Ramp   Expected Discharge Date:    10/17/15              Expected Discharge Plan:  [place]  In-House Referral:     Discharge planning Services  CM Consult  Post Acute Care Choice:  Resumption of Svcs/PTA Provider, Home Health Choice offered to:  Patient  DME Arranged:    DME Agency:     HH Arranged:  RN, Disease Management [place] Agency:  [facility]  Status of Service:  Completed, signed off  Medicare Important Message Given:  Yes Date Medicare IM Given:    Medicare IM give by:    Date Additional Medicare IM Given:    Additional Medicare Important Message give by:     If discussed at Long Length of Stay Meetings, dates discussed:    Discharge Disposition: Home/home health   Additional Comments: ur review done, alerted donna w [facility] of adm. Pt has home o2 w [facility].  10/17/15- 1040- Marvetta Gibbons RN, BSN - pt for d/c home today- was active with [facility]- spoke with Amy- PAC with HF team will resume [place] RN at discharge- order to be placed- notified Manuela Schwartz with [facility] of resumption of [place] services and pt's discharge for today.   Dawayne [name], RN 10/17/2015, 10:44 AM

## 2015-10-18 NOTE — Discharge Summary (Signed)
Advanced Heart Failure Team  Discharge Summary   Patient ID: Brianna Hanson MRN: [id number], DOB/AGE: Mar 18, 1935 81 y.o. Admit date: 10/03/2015 D/C date:     10/18/2015   Primary Discharge Diagnoses:  1. Chronic cholecystitis: s/p laparoscopic cholecystectomy.  2. Anemia- blood loss during surgery- Received 1UPRBC  3. Hypotension 4. Chronic systolic HF s/p LVAD HMII implantation. On coumadin. No aspirin due to dizziness.  5.  PAF- on amio 100 mg daily 6. H/O NSVT 7.  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 [name] (followed q 6 months). Underwent implantation of the HeartMate II LVAD on 04/06/13 for DT. She is not on aspirin due to dizziness.   Admitted for heparin bridge and scheduled lap cholecystectomy. Coumadin stopped prior to admit and INR was allowed to drift down. Once INR was < 1.5 she had laparoscopic cholecystectomy. She received 1 unit PRBCs inter operatively for blood loss. Post operative course complicated by hypotension. Neosynephrine drip started and later weaned off with addition of midodrine. As she improved midodrine was stopped and she was restarted reduced doses of HF meds to manage elevated Maps. For now she is off carvedilol but will consider as an outpatient.   She remained in NSR and will continue on amio 100 mg daily. INR was followed closely. INR on the day discharge was 1.78. She will continue coumadin. Plan to check INR next week in follow up. At the time of discharge she was tolerating a regular diet and pain was controlled. [facility] to follow for HHPT and HHRN.   Plan to see her next week in LVAD clinic. Will set up follow up with General Surgery at that time.   LVAD INTERROGATION:  HeartMate II LVAD: Flow 3.6 liters/min, speed 9200, power 5, PI 7.7 No PI events.       Discharge Weight: 165  pounds  Discharge Vitals: Blood pressure 107/81, pulse 86, temperature 98.5 F (36.9 C), temperature source Oral, resp. rate 18, height 5' (1.524 m), weight 165 lb 3.2 oz (74.934 kg), SpO2 93 %.  Labs: Lab Results  Component Value Date   WBC 6.6 10/17/2015   HGB 8.7* 10/17/2015   HCT 28.8* 10/17/2015   MCV 91.7 10/17/2015   PLT 221 10/17/2015    Recent Labs Lab 10/14/15 0352  10/16/15 0348  NA 139  < > 140  K 3.8  < > 3.0*  CL 104  < > 105  CO2 25  < > 25  BUN <5*  < > <5*  CREATININE 0.88  < > 0.89  CALCIUM 8.3*  < > 8.6*  PROT 6.4*  --   --   BILITOT 0.2*  --   --   ALKPHOS 46  --   --   ALT 15  --   --   AST 14*  --   --   GLUCOSE 87  < > 87  < > = values in this interval not displayed. Lab Results  Component Value Date   CHOL  05/05/2010    130        ATP III CLASSIFICATION:  <200     mg/dL   Desirable  200-239  mg/dL   Borderline High  >=240    mg/dL   High          HDL 36*  05/05/2010   LDLCALC  05/05/2010    76        Total Cholesterol/HDL:CHD Risk Coronary Heart Disease Risk Table                     Men   Women  1/2 Average Risk   3.4   3.3  Average Risk       5.0   4.4  2 X Average Risk   9.6   7.1  3 X Average Risk  23.4   11.0        Use the calculated Patient Ratio above and the CHD Risk Table to determine the patient's CHD Risk.        ATP III CLASSIFICATION (LDL):  <100     mg/dL   Optimal  100-129  mg/dL   Near or Above                    Optimal  130-159  mg/dL   Borderline  160-189  mg/dL   High  >190     mg/dL   Very High   TRIG 90 05/05/2010   BNP (last 3 results)  Recent Labs  01/10/15 1000 05/11/15 1153 07/11/15 1005  BNP 162.1* 214.5* 276.6*    ProBNP (last 3 results) No results for input(s): PROBNP in the last 8760 hours.   Diagnostic Studies/Procedures   No results found.  Discharge Medications     Medication List    STOP taking these medications        carvedilol 12.5 MG tablet  Commonly known as:   COREG     ciprofloxacin 500 MG tablet  Commonly known as:  CIPRO     Potassium Chloride ER 20 MEQ Tbcr      TAKE these medications        amiodarone 200 MG tablet  Commonly known as:  PACERONE  TAKE 1 TABLET BY MOUTH EVERY DAY     docusate sodium 100 MG capsule  Commonly known as:  COLACE  Take 1 capsule (100 mg total) by mouth 2 (two) times daily.     ferrous sulfate 325 (65 FE) MG tablet  Take 1 tablet (325 mg total) by mouth 2 (two) times daily with a meal.     gabapentin 100 MG capsule  Commonly known as:  NEURONTIN  TAKE 1 CAPSULE BY MOUTH AT BEDTIME OR AS DIRECTED     hydrALAZINE 100 MG tablet  Commonly known as:  APRESOLINE  Take 0.5 tablets (50 mg total) by mouth 3 (three) times daily.     losartan 50 MG tablet  Commonly known as:  COZAAR  Take 0.5 tablets (25 mg total) by mouth daily.     pantoprazole 40 MG tablet  Commonly known as:  PROTONIX  TAKE 1 TABLET BY MOUTH EVERY DAY     potassium chloride SA 20 MEQ tablet  Commonly known as:  KLOR-CON M20  Take 2 tablets (40 mEq total) by mouth 2 (two) times daily.     spironolactone 25 MG tablet  Commonly known as:  ALDACTONE  TAKE 1 TABLET BY MOUTH EVERY DAY     traMADol 50 MG tablet  Commonly known as:  ULTRAM  Take 1 tablet (50 mg total) by mouth every 6 (six) hours as needed for moderate pain. for pain     Vitamin D3 1000 units Caps  Take 1 tablet by mouth daily.     warfarin 2.5 MG tablet  Commonly  known as:  COUMADIN  Take 2.5-5 mg by mouth daily. 2.5mg  daily except 5mg  on Wednesdays        Disposition   The patient will be discharged in stable condition to home.     Discharge Instructions    Diet - low sodium heart healthy    Complete by:  As directed      Heart Failure patients record your daily weight using the same scale at the same time of day    Complete by:  As directed      Increase activity slowly    Complete by:  As directed           Follow-up Information    Follow up  with [facility].   Why:  HH-RN resumed    Contact information:   [address] [place] [address] [phone]         Duration of Discharge Encounter: Greater than 35 minutes   Signed, Kalob Bergen NP-C  10/18/2015, 8:07 PM

## 2015-10-19 ENCOUNTER — Telehealth (HOSPITAL_COMMUNITY): Payer: Self-pay | Admitting: Infectious Diseases

## 2015-10-19 ENCOUNTER — Telehealth (HOSPITAL_COMMUNITY): Payer: Self-pay | Admitting: *Deleted

## 2015-10-19 DIAGNOSIS — I482 Chronic atrial fibrillation, unspecified: Secondary | ICD-10-CM

## 2015-10-19 MED ORDER — AMIODARONE HCL 200 MG PO TABS: 100.0000 mg | ORAL_TABLET | Freq: Every day | ORAL | Status: DC

## 2015-10-19 NOTE — Telephone Encounter (Signed)
Called to confirm VAD appt FU s/p hospital DC on Monday 2/20 @ 1000. She has D/C appt with Dr. Donne Hazel tomorrow morning also.

## 2015-10-19 NOTE — Telephone Encounter (Signed)
Brianna Hanson called to clarify home dose of Amiodarone. Per [name], pt should take 100 mg daily, Brianna Hanson verbalized understanding of same.

## 2015-10-20 ENCOUNTER — Other Ambulatory Visit (HOSPITAL_COMMUNITY): Payer: Self-pay | Admitting: *Deleted

## 2015-10-20 DIAGNOSIS — Z7901 Long term (current) use of anticoagulants: Secondary | ICD-10-CM

## 2015-10-20 DIAGNOSIS — Z95811 Presence of heart assist device: Secondary | ICD-10-CM

## 2015-10-23 ENCOUNTER — Ambulatory Visit (HOSPITAL_COMMUNITY): Payer: Self-pay | Admitting: *Deleted

## 2015-10-23 ENCOUNTER — Ambulatory Visit (INDEPENDENT_AMBULATORY_CARE_PROVIDER_SITE_OTHER): Payer: Medicare Other | Admitting: *Deleted

## 2015-10-23 ENCOUNTER — Ambulatory Visit (HOSPITAL_COMMUNITY)
Admission: RE | Admit: 2015-10-23 | Discharge: 2015-10-23 | Disposition: A | Discharge status: Home / Self Care | Payer: Medicare Other | Source: Ambulatory Visit | Attending: Cardiology | Admitting: Cardiology

## 2015-10-23 VITALS — BP 98/0 | HR 99 | Ht 60.0 in | Wt 163.4 lb

## 2015-10-23 DIAGNOSIS — I110 Hypertensive heart disease with heart failure: Secondary | ICD-10-CM | POA: Insufficient documentation

## 2015-10-23 DIAGNOSIS — Z9889 Other specified postprocedural states: Secondary | ICD-10-CM | POA: Diagnosis not present

## 2015-10-23 DIAGNOSIS — I428 Other cardiomyopathies: Secondary | ICD-10-CM | POA: Diagnosis not present

## 2015-10-23 DIAGNOSIS — I429 Cardiomyopathy, unspecified: Secondary | ICD-10-CM | POA: Diagnosis not present

## 2015-10-23 DIAGNOSIS — Z95811 Presence of heart assist device: Secondary | ICD-10-CM | POA: Diagnosis not present

## 2015-10-23 DIAGNOSIS — I5022 Chronic systolic (congestive) heart failure: Secondary | ICD-10-CM

## 2015-10-23 DIAGNOSIS — I480 Paroxysmal atrial fibrillation: Secondary | ICD-10-CM | POA: Insufficient documentation

## 2015-10-23 DIAGNOSIS — Z8674 Personal history of sudden cardiac arrest: Secondary | ICD-10-CM | POA: Diagnosis not present

## 2015-10-23 DIAGNOSIS — Z9049 Acquired absence of other specified parts of digestive tract: Secondary | ICD-10-CM | POA: Diagnosis not present

## 2015-10-23 DIAGNOSIS — Z7901 Long term (current) use of anticoagulants: Secondary | ICD-10-CM | POA: Diagnosis not present

## 2015-10-23 DIAGNOSIS — R5383 Other fatigue: Secondary | ICD-10-CM | POA: Diagnosis not present

## 2015-10-23 DIAGNOSIS — Z79899 Other long term (current) drug therapy: Secondary | ICD-10-CM | POA: Diagnosis not present

## 2015-10-23 LAB — BASIC METABOLIC PANEL
Anion gap: 12 (ref 5–15)
BUN: 14 mg/dL (ref 6–20)
CO2: 19 mmol/L — ABNORMAL LOW (ref 22–32)
Calcium: 10.1 mg/dL (ref 8.9–10.3)
Chloride: 109 mmol/L (ref 101–111)
Creatinine, Ser: 1.43 mg/dL — ABNORMAL HIGH (ref 0.44–1.00)
GFR calc Af Amer: 40 mL/min — ABNORMAL LOW (ref >60)
GFR calc non Af Amer: 34 mL/min — ABNORMAL LOW (ref >60)
Glucose, Bld: 95 mg/dL (ref 65–99)
Potassium: 4.2 mmol/L (ref 3.5–5.1)
Sodium: 140 mmol/L (ref 135–145)

## 2015-10-23 LAB — CBC
HCT: 35.3 % — ABNORMAL LOW (ref 36.0–46.0)
Hemoglobin: 10.9 g/dL — ABNORMAL LOW (ref 12.0–15.0)
MCH: 28.6 pg (ref 26.0–34.0)
MCHC: 30.9 g/dL (ref 30.0–36.0)
MCV: 92.7 fL (ref 78.0–100.0)
Platelets: 318 10*3/uL (ref 150–400)
RBC: 3.81 MIL/uL — ABNORMAL LOW (ref 3.87–5.11)
RDW: 18.5 % — ABNORMAL HIGH (ref 11.5–15.5)
WBC: 7.9 10*3/uL (ref 4.0–10.5)

## 2015-10-23 LAB — LACTATE DEHYDROGENASE: LDH: 312 U/L — ABNORMAL HIGH (ref 98–192)

## 2015-10-23 LAB — PROTIME-INR
INR: 2.03 — ABNORMAL HIGH (ref 0.00–1.49)
Prothrombin Time: 22.8 seconds — ABNORMAL HIGH (ref 11.6–15.2)

## 2015-10-23 NOTE — Progress Notes (Signed)
Remote ICD transmission.   

## 2015-10-23 NOTE — Progress Notes (Addendum)
Symptom  Yes  No  Details   Angina        x Activity:   Claudication        x How far:   Syncope        x When:   Stroke        x   Orthopnea        x How many pillows:  2  PND        x How often:  CPAP     N/A How many hrs:   Pedal edema        x   Abd fullness       x    N&V        x Good appetite; early satiety   Diaphoresis       x      When:  Nightly (normal)  Bleeding       x Dark stools from iron   Urine color             Medium yellow  SOB       x       Activity: room to room; conversational SOB  Palpitations         x When:  ICD shock         x   Hospitlizaitons      x  When/where/why: 1/31 - 10/18/15  ED visit         x When/where/why:   Other MD    When/who/why:  Activity      Sleeping most of the day and night  Fluid    2 liters/day  Diet    Low salt   Vital signs: HR:  99 Doppler modified systolic:  98 Auto cuff BP:  103/73 (81)   O2 Sat:  98 Wt:  163.4 lbs Last wt:  171 lbs Ht:  5'  LVAD interrogation reveals (reviewed by Dr Aundra Dubin):  Speed:   9200 Flow:   4.1 Power:   5.0 PI:  5.6 Alarms:  none Events:   0 - 10 PI events Fixed speed:   9200 Low speed limit:  8600 Primary Controller:  Replace back up battery in 5 months Back up controller:   Replace back up battery in 5 months  Both primary and back up controller 11V batteries are near end of life at 5 month replacement dates.  Primary controller 11V battery replaced with SQ [id number] (exp 04/16/17)  Unable to replace back up controller 11V battery (kept alarming attach driveline). Back up controller replaced with [id number] with 11V back up battery [id number]. Charged during clinic visit.  Completed 2.5 year Intermacs including QOL EQ-5D-3L and KCCQ-12. Pt did not attempt 6 minute walk or neurocognitive testing, stating she was "too tired" today.  LVAD exit site:  Well healed and incorporated. The velour is fully implanted at exit site. Sorbaview dressing dry and intact. Stabiliization device present and  accurately applied. Driveline dressing is being changed  weekly per sterile technique using Sorbaview dressing with biopatch on exit site. Pt denies fever or chills. Pt/caregiver provided with adequate dressing supplies for home.  I reviewed the LVAD parameters from today, and compared the results to the patient's prior recorded data. No programming changes were made. The LVAD is functioning within specified parameters.  LVAD interrogation was negative for any significant power changes or alarms; a few PI events/speed drops present which is consistent with the patient's history.   Pt/caregiver deny any alarms or  VAD equipment issues. VAD equipment check completed and is in good working order. Back-up equipment present. LVAD education done on emergency procedures and precautions and reviewed exit site care.   Patient Instructions: 1.  No change in meds. 2.  Start home physical therapy to increase stamina. 3   No change in coumadin dose. 4.  Increase fluid intake daily. 5.  Return to [place] clinic in two weeks.   Zada Girt, RN     ADVANCED HF CLINIC NOTE  Patient ID: Brianna Hanson, female   DOB: 1935-03-14, 81 y.o.   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 [name] (follwoed q 6 months). Underwent implantation of the HeartMate II LVAD on 04/06/13 for DT. She is not on aspirin due to dizziness.   Admitted to [facility] from 12/4 to 08/18/15 for acute cholecystitis. Underwent placement of a cholecystotomy drain by IR.  Seen in IR drain clinic and cholangiogram showed patent ducts with residual gallstones.  She was admitted in 2/17 for laparoscopic cholecystectomy.  This was successful.  She is now back at home.  No dyspnea but feels generally fatigued.  No fever, diarrhea, dysuria.  Appetite improving, mild  abdominal soreness.    Denies LVAD alarms.  Denies driveline trauma, erythema or drainage.  Denies ICD shocks.   Reports taking Coumadin as prescribed.  Denies bright red blood per rectum or melena, no dark urine or hematuria.    VAD interrogation & Equipment Management:  See LVAD nurse's note above.   Echo (7/15) with LV EF 30%.    Past Medical History  Diagnosis Date  . Cardiac arrest - ventricular fibrillation 12/10    with successful resucitation, S/p ICD  . ICD (implantable cardiac defibrillator) in place     she has received appropriate therapy for VF  . Atrial fibrillation or flutter   . Osteopenia   . HTN (hypertension)     moderate  . Nonischemic cardiomyopathy ([place])     followed by Dr April Holding at [facility]  . CHF (congestive heart failure) ([place])   . Anemia   . Plasma cell disorder 03/20/2012  . Plasma cell disorder 03/20/2012  . Sessile colonic polyp 2011    Dr Benson Norway  . Diverticula of colon 2011  . Internal and external hemorrhoids without complication [id number]  . AICD (automatic cardioverter/defibrillator) present     Current Outpatient Prescriptions  Medication Sig Dispense Refill  . amiodarone (PACERONE) 200 MG tablet Take 0.5 tablets (100 mg total) by mouth daily. 90 tablet 3  . Cholecalciferol (VITAMIN D3) 1000 UNITS CAPS Take 1 tablet by mouth daily.      . ferrous sulfate 325 (65 FE) MG tablet Take 1 tablet (325 mg total) by mouth 2 (two) times daily with a meal. 60 tablet 6  . gabapentin (NEURONTIN) 100 MG capsule TAKE 1 CAPSULE BY MOUTH AT BEDTIME OR AS DIRECTED 30 capsule 5  . hydrALAZINE (APRESOLINE) 100 MG tablet Take 0.5 tablets (50 mg total) by mouth 3 (three) times daily. 90 tablet 6  . losartan (COZAAR) 50 MG tablet Take 0.5 tablets (25 mg total) by mouth daily. 30 tablet 6  . pantoprazole (PROTONIX) 40 MG tablet TAKE 1 TABLET BY MOUTH EVERY DAY 30 tablet 6  . potassium chloride SA (KLOR-CON M20) 20  MEQ tablet Take 2 tablets (40 mEq total) by mouth 2 (two)  times daily. (Patient taking differently: Take 40 mEq by mouth 2 (two) times daily. Pt taking 20 meq bid) 80 tablet 3  . spironolactone (ALDACTONE) 25 MG tablet TAKE 1 TABLET BY MOUTH EVERY DAY 30 tablet 6  . traMADol (ULTRAM) 50 MG tablet Take 1 tablet (50 mg total) by mouth every 6 (six) hours as needed for moderate pain. for pain (Patient taking differently: Take 50 mg by mouth every 6 (six) hours as needed for moderate pain. ) 30 tablet 3  . warfarin (COUMADIN) 2.5 MG tablet Take 2.5-5 mg by mouth daily. 2.5mg  daily except 5mg  on Wednesdays    . docusate sodium (COLACE) 100 MG capsule Take 1 capsule (100 mg total) by mouth 2 (two) times daily. (Patient not taking: Reported on 10/23/2015) 60 capsule 6   No current facility-administered medications for this encounter.    Review of patient's allergies indicates no known allergies.  REVIEW OF SYSTEMS: All systems negative except as listed in HPI, PMH and Problem list.   Vital Signs:  BP 98/0 mmHg  Pulse 99  Ht 5' (1.524 m)  Wt 163 lb 6.4 oz (74.118 kg)  BMI 31.91 kg/m2  SpO2 98% MAP 81  Physical Exam: GENERAL: Well appearing, female, NAD, [name] present HEENT: normal  NECK: Supple, JVP 5-6,  2+ bilaterally, no bruits.  No lymphadenopathy or thyromegaly appreciated.   CARDIAC:  LVAD hum present.  LUNGS:  Clear to auscultation bilaterally.  ABDOMEN:  Soft, round, nontender, positive bowel sounds. LVAD exit site: Dressing dry and intact.  No erythema or drainage.  Stabilization device present and accurately applied. Driveline dressing changed weekly. EXTREMITIES:  Warm and dry, no cyanosis, clubbing, rash, edema  NEUROLOGIC:  Alert and oriented x 4.  Gait steady.  No aphasia.  No dysarthria.  Affect pleasant.     ASSESSMENT AND PLAN:  1) Chronic systolic HF: NICM, s/p ICD and LVAD for DT(04/2013).  Now s/p hospitalization for laparoscopic cholecystectomy. - Continue losartan, spironolactone, and hydralazine 25 mg tid.  Her BP  medication requirement is a bit lower than in the past.  - Euvolemic, no need for Lasix.  - Reinforced the need and importance of daily weights, a low sodium diet, and fluid restriction (less than 2 L a day). Instructed to call the HF clinic if weight increases more than 3 lbs overnight or 5 lbs in a week.  2) Acute cholecystitis: Now s/p lap cholecystectomy.  Generalized fatigue may be due to deconditioning after recent hospital stays.  3) LVAD placed for DT 04/2013: All parameters stable. (reviewed personally) - Check LDH, INR, CBC and BMET today.  - Remains on coumadin.   Not on aspirin due to intolerance. - VAD parameters look good 4) PAF: Stable. Continue amiodarone to 100 mg daily. Follow TSH and LFTs.  Needs year eye exams.    5) HTN: See above, have been able to cut back on meds.     6) Chronic Anticoagulation: Goal INR 2-3 Continue coumadin. Check INR   Loralie Champagne  MD  10/23/2015

## 2015-10-23 NOTE — Patient Instructions (Signed)
1.  No change in meds. 2.  Start home physical therapy. 3   No change in coumadin dose. 4.  Return to [place] clinic in two weeks.

## 2015-11-09 ENCOUNTER — Other Ambulatory Visit (HOSPITAL_COMMUNITY): Payer: Self-pay | Admitting: *Deleted

## 2015-11-09 ENCOUNTER — Ambulatory Visit (HOSPITAL_COMMUNITY)
Admission: RE | Admit: 2015-11-09 | Discharge: 2015-11-09 | Disposition: A | Discharge status: Home / Self Care | Payer: Medicare Other | Source: Ambulatory Visit | Attending: Cardiology | Admitting: Cardiology

## 2015-11-09 ENCOUNTER — Ambulatory Visit: Payer: Self-pay | Admitting: *Deleted

## 2015-11-09 DIAGNOSIS — Z95811 Presence of heart assist device: Secondary | ICD-10-CM | POA: Diagnosis not present

## 2015-11-09 DIAGNOSIS — Z9049 Acquired absence of other specified parts of digestive tract: Secondary | ICD-10-CM | POA: Diagnosis not present

## 2015-11-09 DIAGNOSIS — Z7901 Long term (current) use of anticoagulants: Secondary | ICD-10-CM

## 2015-11-09 DIAGNOSIS — I480 Paroxysmal atrial fibrillation: Secondary | ICD-10-CM | POA: Insufficient documentation

## 2015-11-09 DIAGNOSIS — I428 Other cardiomyopathies: Secondary | ICD-10-CM | POA: Diagnosis not present

## 2015-11-09 DIAGNOSIS — Z79899 Other long term (current) drug therapy: Secondary | ICD-10-CM | POA: Insufficient documentation

## 2015-11-09 DIAGNOSIS — I110 Hypertensive heart disease with heart failure: Secondary | ICD-10-CM | POA: Diagnosis not present

## 2015-11-09 DIAGNOSIS — I5022 Chronic systolic (congestive) heart failure: Secondary | ICD-10-CM | POA: Insufficient documentation

## 2015-11-09 LAB — BASIC METABOLIC PANEL
Anion gap: 11 (ref 5–15)
BUN: 10 mg/dL (ref 6–20)
CO2: 19 mmol/L — ABNORMAL LOW (ref 22–32)
Calcium: 9.3 mg/dL (ref 8.9–10.3)
Chloride: 111 mmol/L (ref 101–111)
Creatinine, Ser: 1.05 mg/dL — ABNORMAL HIGH (ref 0.44–1.00)
GFR calc Af Amer: 57 mL/min — ABNORMAL LOW (ref >60)
GFR calc non Af Amer: 50 mL/min — ABNORMAL LOW (ref >60)
Glucose, Bld: 82 mg/dL (ref 65–99)
Potassium: 4 mmol/L (ref 3.5–5.1)
Sodium: 141 mmol/L (ref 135–145)

## 2015-11-09 LAB — CBC
HCT: 35.8 % — ABNORMAL LOW (ref 36.0–46.0)
Hemoglobin: 11.4 g/dL — ABNORMAL LOW (ref 12.0–15.0)
MCH: 29.2 pg (ref 26.0–34.0)
MCHC: 31.8 g/dL (ref 30.0–36.0)
MCV: 91.8 fL (ref 78.0–100.0)
Platelets: 186 10*3/uL (ref 150–400)
RBC: 3.9 MIL/uL (ref 3.87–5.11)
RDW: 17.6 % — ABNORMAL HIGH (ref 11.5–15.5)
WBC: 7.1 10*3/uL (ref 4.0–10.5)

## 2015-11-09 LAB — PROTIME-INR
INR: 2.36 — ABNORMAL HIGH (ref 0.00–1.49)
Prothrombin Time: 25.5 seconds — ABNORMAL HIGH (ref 11.6–15.2)

## 2015-11-09 LAB — LACTATE DEHYDROGENASE: LDH: 226 U/L — ABNORMAL HIGH (ref 98–192)

## 2015-11-09 NOTE — Progress Notes (Addendum)
Symptom  Yes  No  Details   Angina        x Activity:   Claudication        x How far:   Syncope        x When:   Stroke        x   Orthopnea        x How many pillows:  1  PND        x How often:  CPAP     N/A How many hrs:   Pedal edema        x   Abd fullness             x   N&V        x Good appetite; early satiety   Diaphoresis             x When:  Nightly (normal)  Bleeding       x Dark stools from iron   Urine color             Medium yellow  SOB              x Activity:   Palpitations         x When:  ICD shock         x   Hospitlizaitons             x When/where/why:   ED visit         x When/where/why:   Other MD    When/who/why:  Activity      Improved  Fluid    2 liters/day  Diet    Low salt   Vital signs: HR:  89 Doppler modified systolic:  [id number] Auto cuff BP:  113/72 (90) O2 Sat:  98 Wt:  166.6  lbs Last wt: 163.4  lbs Ht:  5'  LVAD interrogation reveals (reviewed with Dr Aundra Dubin):  Speed:   9200 Flow:   3.6 Power:   7.0 PI:  4.7 Alarms:  none Events:   5 - 10 PI events Fixed speed:   9200 Low speed limit:  8600 Primary Controller:  Replace back up battery in 30  months Back up controller:   Replace back up battery in 30  months   LVAD exit site:  Well healed and incorporated. The velour is fully implanted at exit site. Sorbaview dressing dry and intact. Stabiliization device present and accurately applied. Driveline dressing is being changed  weekly per sterile technique using Sorbaview dressing with biopatch on exit site. Pt denies fever or chills. Pt/caregiver provided with adequate dressing supplies for home.  I reviewed the LVAD parameters from today, and compared the results to the patient's prior recorded data. No programming changes were made. The LVAD is functioning within specified parameters.  LVAD interrogation was negative for any significant power changes or alarms; a few PI events/speed drops present which is consistent with the patient's  history.   Pt/caregiver deny any alarms or VAD equipment issues. VAD equipment check completed and is in good working order. Back-up equipment present. LVAD education done on emergency procedures and precautions and reviewed exit site care.   Patient Instructions:  1.  No change in meds. 2.  Re-check INR in two weeks. 3.  Return to [place] clinic in two months.     ADVANCED HF CLINIC NOTE  Patient ID: Brianna Hanson, female   DOB: 07/25/1935, 81 y.o.   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 [name] (follwoed q 6 months). Underwent implantation of the HeartMate II LVAD on 04/06/13 for DT. She is not on aspirin due to dizziness.   Admitted to [facility] from 12/4 to 08/18/15 for acute cholecystitis. Underwent placement of a cholecystotomy drain by IR.  Seen in IR drain clinic and cholangiogram showed patent ducts with residual gallstones.  She was admitted in 2/17 for laparoscopic cholecystectomy.  This was successful.  She is now back at home.  She is doing quite well now.  Walking without dyspnea.  No abdominal pain or nausea.  Eating normally.     Denies LVAD alarms.  Denies driveline trauma, erythema or drainage.  Denies ICD shocks.   Reports taking Coumadin as prescribed.  Denies bright red blood per rectum or melena, no dark urine or hematuria.    Labs (3/17): hgb 11.4, INR 2.36  VAD interrogation & Equipment Management:  See LVAD nurse's note above.   Echo (7/15) with LV EF 30%.    Past Medical History  Diagnosis Date  . Cardiac arrest - ventricular fibrillation 12/10    with successful resucitation, S/p ICD  . ICD (implantable cardiac defibrillator) in place     she has received appropriate therapy for VF  . Atrial fibrillation or flutter   . Osteopenia   . HTN (hypertension)      moderate  . Nonischemic cardiomyopathy ([place])     followed by Dr April Holding at [facility]  . CHF (congestive heart failure) ([place])   . Anemia   . Plasma cell disorder 03/20/2012  . Plasma cell disorder 03/20/2012  . Sessile colonic polyp 2011    Dr Benson Norway  . Diverticula of colon 2011  . Internal and external hemorrhoids without complication [id number]  . AICD (automatic cardioverter/defibrillator) present     Current Outpatient Prescriptions  Medication Sig Dispense Refill  . amiodarone (PACERONE) 200 MG tablet Take 0.5 tablets (100 mg total) by mouth daily. 90 tablet 3  . Cholecalciferol (VITAMIN D3) 1000 UNITS CAPS Take 1 tablet by mouth daily.      [name] docusate sodium (COLACE) 100 MG capsule Take 1 capsule (100 mg total) by mouth 2 (two) times daily. 60 capsule 6  . ferrous sulfate 325 (65 FE) MG tablet Take 1 tablet (325 mg total) by mouth 2 (two) times daily with a meal. 60 tablet 6  . gabapentin (NEURONTIN) 100 MG capsule TAKE 1 CAPSULE BY MOUTH AT BEDTIME OR AS DIRECTED 30 capsule 5  . hydrALAZINE (APRESOLINE) 100 MG tablet Take 0.5 tablets (50 mg total) by mouth 3 (three) times daily. 90 tablet 6  . losartan (COZAAR) 50 MG tablet Take 0.5 tablets (25 mg total) by mouth daily. 30 tablet 6  . pantoprazole (PROTONIX) 40 MG tablet TAKE 1 TABLET BY MOUTH EVERY DAY 30 tablet 6  . potassium chloride SA (K-DUR,KLOR-CON) 20 MEQ tablet Take 20 mEq by mouth 2 (two) times daily.    [name] spironolactone (ALDACTONE) 25 MG tablet TAKE 1 TABLET BY MOUTH EVERY DAY 30 tablet 6  . traMADol (ULTRAM) 50 MG tablet Take 1 tablet (50 mg total) by mouth every 6 (six) hours as needed for moderate pain. for pain (Patient taking differently: Take 50 mg by mouth every 6 (six) hours as needed  for moderate pain. ) 30 tablet 3  . warfarin (COUMADIN) 2.5 MG tablet Take 2.5-5 mg by mouth daily. 2.5mg  daily except 5mg  on Wednesdays     No current facility-administered medications for this encounter.    Review of patient's allergies  indicates no known allergies.  REVIEW OF SYSTEMS: All systems negative except as listed in HPI, PMH and Problem list.   Vital Signs:  BP 90/0 mmHg  Pulse 89  Ht 5' (1.524 m)  Wt 166 lb 9.6 oz (75.569 kg)  BMI 32.54 kg/m2  SpO2 98% MAP 81  Physical Exam: GENERAL: Well appearing, female, NAD, [name] present HEENT: normal  NECK: Supple, JVP 5-6,  2+ bilaterally, no bruits.  No lymphadenopathy or thyromegaly appreciated.   CARDIAC:  LVAD hum present.  LUNGS:  Clear to auscultation bilaterally.  ABDOMEN:  Soft, round, nontender, positive bowel sounds. LVAD exit site: Dressing dry and intact.  No erythema or drainage.  Stabilization device present and accurately applied. Driveline dressing changed weekly. EXTREMITIES:  Warm and dry, no cyanosis, clubbing, rash, edema  NEUROLOGIC:  Alert and oriented x 4.  Gait steady.  No aphasia.  No dysarthria.  Affect pleasant.     ASSESSMENT AND PLAN:  1) Chronic systolic HF: NICM, s/p ICD and LVAD for DT(04/2013).  Now s/p hospitalization for laparoscopic cholecystectomy. - Continue losartan, spironolactone, and hydralazine 25 mg tid.  Her BP medication requirement is a bit lower than in the past.  - Euvolemic, no need for Lasix.  - Reinforced the need and importance of daily weights, a low sodium diet, and fluid restriction (less than 2 L a day). Instructed to call the HF clinic if weight increases more than 3 lbs overnight or 5 lbs in a week.  2) Acute cholecystitis: Now s/p lap cholecystectomy.  Generally feels like strength is doing better.  3) LVAD placed for DT 04/2013: All parameters stable (reviewed personally).  - Check LDH, INR, CBC and BMET today.  - Remains on coumadin.   Not on aspirin due to intolerance. - VAD parameters look good 4) PAF: Stable. Continue amiodarone to 100 mg daily. Follow TSH and LFTs.  Needs year eye exams.    5) HTN: See above, have been able to cut back on meds.     6) Chronic Anticoagulation: Goal INR 2-3 Continue  coumadin. Check INR   Loralie Champagne  MD  11/11/2015

## 2015-11-09 NOTE — Patient Instructions (Signed)
1.  No change in meds. 2.  Re-check INR in two weeks. 3.  Return to [place] clinic in two months.

## 2015-11-10 ENCOUNTER — Encounter (HOSPITAL_COMMUNITY): Payer: Medicare Other

## 2015-11-16 LAB — CUP PACEART REMOTE DEVICE CHECK
Battery Remaining Longevity: 41 mo
Battery Remaining Percentage: 40 %
Battery Voltage: 2.9 V
Brady Statistic AP VP Percent: 1 %
Brady Statistic AP VS Percent: 30 %
Brady Statistic AS VP Percent: 1 %
Brady Statistic AS VS Percent: 69 %
Brady Statistic RA Percent Paced: 28 %
Brady Statistic RV Percent Paced: 1 %
Date Time Interrogation Session: 20170220082428
HighPow Impedance: 48 Ohm
Implantable Lead Implant Date: 20110104
Implantable Lead Implant Date: 20110104
Implantable Lead Location: 753859
Implantable Lead Location: 753860
Implantable Lead Model: 7121
Lead Channel Impedance Value: 330 Ohm
Lead Channel Impedance Value: 340 Ohm
Lead Channel Sensing Intrinsic Amplitude: 1 mV
Lead Channel Sensing Intrinsic Amplitude: 11.4 mV
Lead Channel Setting Pacing Amplitude: 2 V
Lead Channel Setting Pacing Amplitude: 2.5 V
Lead Channel Setting Pacing Pulse Width: 0.5 ms
Lead Channel Setting Sensing Sensitivity: 0.5 mV
Pulse Gen Serial Number: 754815

## 2015-11-17 ENCOUNTER — Encounter: Payer: Self-pay | Admitting: Cardiology

## 2015-11-24 ENCOUNTER — Telehealth: Payer: Self-pay

## 2015-11-24 NOTE — Telephone Encounter (Signed)
Patient referred to [facility] clinic by Dr Rayann Heman.  Attempted ICM call to patient for ICM intro and voice mail message left for return call.

## 2015-11-27 ENCOUNTER — Ambulatory Visit (HOSPITAL_COMMUNITY)
Admission: RE | Admit: 2015-11-27 | Discharge: 2015-11-27 | Disposition: A | Discharge status: Home / Self Care | Payer: Medicare Other | Source: Ambulatory Visit | Attending: Internal Medicine | Admitting: Internal Medicine

## 2015-11-27 ENCOUNTER — Ambulatory Visit (HOSPITAL_COMMUNITY): Payer: Self-pay | Admitting: *Deleted

## 2015-11-27 DIAGNOSIS — I5023 Acute on chronic systolic (congestive) heart failure: Secondary | ICD-10-CM

## 2015-11-27 DIAGNOSIS — Z7901 Long term (current) use of anticoagulants: Secondary | ICD-10-CM

## 2015-11-27 DIAGNOSIS — Z95811 Presence of heart assist device: Secondary | ICD-10-CM | POA: Insufficient documentation

## 2015-11-27 DIAGNOSIS — Z5181 Encounter for therapeutic drug level monitoring: Secondary | ICD-10-CM | POA: Insufficient documentation

## 2015-11-27 LAB — PROTIME-INR
INR: 1.86 — ABNORMAL HIGH (ref 0.00–1.49)
Prothrombin Time: 21.3 seconds — ABNORMAL HIGH (ref 11.6–15.2)

## 2015-12-11 ENCOUNTER — Ambulatory Visit (HOSPITAL_COMMUNITY): Payer: Self-pay | Admitting: Unknown Physician Specialty

## 2015-12-11 ENCOUNTER — Other Ambulatory Visit (HOSPITAL_COMMUNITY): Payer: Self-pay | Admitting: Unknown Physician Specialty

## 2015-12-11 ENCOUNTER — Ambulatory Visit (HOSPITAL_COMMUNITY)
Admission: RE | Admit: 2015-12-11 | Discharge: 2015-12-11 | Disposition: A | Discharge status: Home / Self Care | Payer: Medicare Other | Source: Ambulatory Visit | Attending: Internal Medicine | Admitting: Internal Medicine

## 2015-12-11 DIAGNOSIS — Z95811 Presence of heart assist device: Secondary | ICD-10-CM | POA: Insufficient documentation

## 2015-12-11 DIAGNOSIS — Z7901 Long term (current) use of anticoagulants: Secondary | ICD-10-CM | POA: Insufficient documentation

## 2015-12-11 DIAGNOSIS — Z5181 Encounter for therapeutic drug level monitoring: Secondary | ICD-10-CM | POA: Insufficient documentation

## 2015-12-11 LAB — PROTIME-INR
INR: 1.77 — ABNORMAL HIGH (ref 0.00–1.49)
Prothrombin Time: 20.5 seconds — ABNORMAL HIGH (ref 11.6–15.2)

## 2015-12-15 NOTE — Telephone Encounter (Signed)
Attempted ICM enrollment call and no answer.  Unable to reach patient for enrollment in [facility] clinic.

## 2015-12-21 ENCOUNTER — Ambulatory Visit (HOSPITAL_COMMUNITY): Payer: Self-pay | Admitting: *Deleted

## 2015-12-21 ENCOUNTER — Ambulatory Visit (HOSPITAL_COMMUNITY)
Admission: RE | Admit: 2015-12-21 | Discharge: 2015-12-21 | Disposition: A | Discharge status: Home / Self Care | Payer: Medicare Other | Source: Ambulatory Visit | Attending: Cardiology | Admitting: Cardiology

## 2015-12-21 ENCOUNTER — Other Ambulatory Visit (HOSPITAL_COMMUNITY): Payer: Medicare Other

## 2015-12-21 DIAGNOSIS — Z7901 Long term (current) use of anticoagulants: Secondary | ICD-10-CM

## 2015-12-21 DIAGNOSIS — Z5181 Encounter for therapeutic drug level monitoring: Secondary | ICD-10-CM | POA: Insufficient documentation

## 2015-12-21 DIAGNOSIS — Z95811 Presence of heart assist device: Secondary | ICD-10-CM | POA: Diagnosis not present

## 2015-12-21 DIAGNOSIS — I5023 Acute on chronic systolic (congestive) heart failure: Secondary | ICD-10-CM | POA: Diagnosis not present

## 2015-12-21 LAB — PROTIME-INR
INR: 1.77 — ABNORMAL HIGH (ref 0.00–1.49)
Prothrombin Time: 20.6 seconds — ABNORMAL HIGH (ref 11.6–15.2)

## 2016-01-01 ENCOUNTER — Ambulatory Visit (HOSPITAL_COMMUNITY): Payer: Self-pay | Admitting: Unknown Physician Specialty

## 2016-01-01 ENCOUNTER — Other Ambulatory Visit (HOSPITAL_COMMUNITY): Payer: Self-pay | Admitting: Unknown Physician Specialty

## 2016-01-01 ENCOUNTER — Ambulatory Visit (HOSPITAL_COMMUNITY)
Admission: RE | Admit: 2016-01-01 | Discharge: 2016-01-01 | Disposition: A | Discharge status: Home / Self Care | Payer: Medicare Other | Source: Ambulatory Visit | Attending: Internal Medicine | Admitting: Internal Medicine

## 2016-01-01 VITALS — BP 92/0 | HR 102 | Ht 60.0 in | Wt 167.6 lb

## 2016-01-01 DIAGNOSIS — Z95811 Presence of heart assist device: Secondary | ICD-10-CM | POA: Diagnosis not present

## 2016-01-01 DIAGNOSIS — Z79899 Other long term (current) drug therapy: Secondary | ICD-10-CM | POA: Diagnosis not present

## 2016-01-01 DIAGNOSIS — I10 Essential (primary) hypertension: Secondary | ICD-10-CM

## 2016-01-01 DIAGNOSIS — I5023 Acute on chronic systolic (congestive) heart failure: Secondary | ICD-10-CM | POA: Diagnosis not present

## 2016-01-01 DIAGNOSIS — I110 Hypertensive heart disease with heart failure: Secondary | ICD-10-CM | POA: Insufficient documentation

## 2016-01-01 DIAGNOSIS — Z7901 Long term (current) use of anticoagulants: Secondary | ICD-10-CM | POA: Diagnosis not present

## 2016-01-01 DIAGNOSIS — Z9889 Other specified postprocedural states: Secondary | ICD-10-CM | POA: Insufficient documentation

## 2016-01-01 DIAGNOSIS — M8580 Other specified disorders of bone density and structure, unspecified site: Secondary | ICD-10-CM | POA: Insufficient documentation

## 2016-01-01 DIAGNOSIS — I428 Other cardiomyopathies: Secondary | ICD-10-CM | POA: Insufficient documentation

## 2016-01-01 DIAGNOSIS — I480 Paroxysmal atrial fibrillation: Secondary | ICD-10-CM | POA: Insufficient documentation

## 2016-01-01 DIAGNOSIS — I5022 Chronic systolic (congestive) heart failure: Secondary | ICD-10-CM | POA: Diagnosis not present

## 2016-01-01 LAB — PROTIME-INR
INR: 1.8 — ABNORMAL HIGH (ref 0.00–1.49)
Prothrombin Time: 20.8 seconds — ABNORMAL HIGH (ref 11.6–15.2)

## 2016-01-01 LAB — BASIC METABOLIC PANEL
Anion gap: 9 (ref 5–15)
BUN: 13 mg/dL (ref 6–20)
CO2: 22 mmol/L (ref 22–32)
Calcium: 9.1 mg/dL (ref 8.9–10.3)
Chloride: 109 mmol/L (ref 101–111)
Creatinine, Ser: 1.12 mg/dL — ABNORMAL HIGH (ref 0.44–1.00)
GFR calc Af Amer: 53 mL/min — ABNORMAL LOW (ref >60)
GFR calc non Af Amer: 45 mL/min — ABNORMAL LOW (ref >60)
Glucose, Bld: 91 mg/dL (ref 65–99)
Potassium: 4.3 mmol/L (ref 3.5–5.1)
Sodium: 140 mmol/L (ref 135–145)

## 2016-01-01 LAB — CBC
HCT: 31.9 % — ABNORMAL LOW (ref 36.0–46.0)
Hemoglobin: 10.1 g/dL — ABNORMAL LOW (ref 12.0–15.0)
MCH: 29.9 pg (ref 26.0–34.0)
MCHC: 31.7 g/dL (ref 30.0–36.0)
MCV: 94.4 fL (ref 78.0–100.0)
Platelets: 223 10*3/uL (ref 150–400)
RBC: 3.38 MIL/uL — ABNORMAL LOW (ref 3.87–5.11)
RDW: 17.2 % — ABNORMAL HIGH (ref 11.5–15.5)
WBC: 8.6 10*3/uL (ref 4.0–10.5)

## 2016-01-01 LAB — LACTATE DEHYDROGENASE: LDH: 211 U/L — ABNORMAL HIGH (ref 98–192)

## 2016-01-01 MED ORDER — CARVEDILOL 6.25 MG PO TABS: 6.2500 mg | ORAL_TABLET | Freq: Two times a day (BID) | ORAL | Status: DC

## 2016-01-01 NOTE — Progress Notes (Signed)
Symptom  Yes  No  Details   Angina        x Activity:   Claudication        x How far:   Syncope        x When:   Stroke        x   Orthopnea        x How many pillows:  1  PND        x How often:  CPAP     N/A How many hrs:   Pedal edema        x   Abd fullness             x   N&V        x Good appetite; early satiety   Diaphoresis             x When:  Nightly (normal)  Bleeding       x Dark stools from iron   Urine color            x Medium yellow  SOB              x Activity:   Palpitations         x When:  ICD shock         x   Hospitlizaitons             x When/where/why:   ED visit         x When/where/why:   Other MD        x When/who/why:  Activity        x Improved  Fluid      2 liters/day  Diet    Low salt   Vital signs: HR:  102 Doppler modified systolic:  92 Auto cuff BP:  136/69 (97) O2 Sat:  97 Wt:  167.6  lbs Last wt: 166.6  lbs Ht:  5'  LVAD interrogation reveals (reviewed with Dr Aundra Dubin):  Speed:   9200 Flow:   4.3 Power:   5.1 PI:  7.1 Alarms:  none Events:   4 - 8 PI events Fixed speed:   9200 Low speed limit:  8600 Primary Controller:  Replace back up battery in 28  months Back up controller:   Replace back up battery in 28  months   LVAD exit site:  Well healed and incorporated. The velour is fully implanted at exit site. Sorbaview dressing dry and intact. Stabiliization device present and accurately applied. Driveline dressing is being changed  weekly per sterile technique using Sorbaview dressing with biopatch on exit site. Pt denies fever or chills. Pt/caregiver provided with adequate dressing supplies for home.  I reviewed the LVAD parameters from today, and compared the results to the patient's prior recorded data. No programming changes were made. The LVAD is functioning within specified parameters.  LVAD interrogation was negative for any significant power changes or alarms; a few PI events/speed drops present which is consistent with the  patient's history with the exception of an isolated day on 4/8 with 40 PI events. Pt states this day was her birthday and that she consumed more food and drink than normal.   Pt/caregiver deny any alarms or VAD equipment issues. VAD equipment check completed and is in good working order. Back-up equipment present. LVAD education done on emergency procedures and precautions and reviewed exit site care.   Patient Instructions:  1.  Begin Coreg 6.25 twice a day.  2.  Continue Coumadin 5 mg daily with 2.5 mg MWF. Please come for labs on Monday 5/8 to recheck INR. 3.  Return to [place] clinic in one month.      Tanda Rockers RN, VAD coordinator

## 2016-01-01 NOTE — Patient Instructions (Signed)
1.  Begin Coreg 6.25 twice a day.  2.  Continue Coumadin 5 mg daily with 2.5 mg MWF. Please come for labs on Monday 5/8 to recheck INR. 3.  Return to [place] clinic in one month.

## 2016-01-03 ENCOUNTER — Encounter: Payer: Self-pay | Admitting: Internal Medicine

## 2016-01-03 ENCOUNTER — Ambulatory Visit (INDEPENDENT_AMBULATORY_CARE_PROVIDER_SITE_OTHER): Payer: Medicare Other | Admitting: Internal Medicine

## 2016-01-03 VITALS — BP 94/72 | HR 73 | Ht 60.0 in | Wt 169.0 lb

## 2016-01-03 DIAGNOSIS — I4901 Ventricular fibrillation: Secondary | ICD-10-CM | POA: Diagnosis not present

## 2016-01-03 DIAGNOSIS — I480 Paroxysmal atrial fibrillation: Secondary | ICD-10-CM

## 2016-01-03 DIAGNOSIS — I5022 Chronic systolic (congestive) heart failure: Secondary | ICD-10-CM | POA: Diagnosis not present

## 2016-01-03 DIAGNOSIS — I429 Cardiomyopathy, unspecified: Secondary | ICD-10-CM

## 2016-01-03 LAB — CUP PACEART INCLINIC DEVICE CHECK
Battery Remaining Longevity: 43.2
Brady Statistic RA Percent Paced: 25 %
Brady Statistic RV Percent Paced: 0.21 %
Date Time Interrogation Session: 20170503161914
HighPow Impedance: 43.5896
Implantable Lead Implant Date: 20110104
Implantable Lead Implant Date: 20110104
Implantable Lead Location: 753859
Implantable Lead Location: 753860
Implantable Lead Model: 7121
Lead Channel Impedance Value: 300 Ohm
Lead Channel Impedance Value: 312.5 Ohm
Lead Channel Pacing Threshold Amplitude: 0.75 V
Lead Channel Pacing Threshold Amplitude: 0.75 V
Lead Channel Pacing Threshold Amplitude: 0.75 V
Lead Channel Pacing Threshold Amplitude: 0.75 V
Lead Channel Pacing Threshold Pulse Width: 0.5 ms
Lead Channel Pacing Threshold Pulse Width: 0.5 ms
Lead Channel Pacing Threshold Pulse Width: 0.5 ms
Lead Channel Pacing Threshold Pulse Width: 0.5 ms
Lead Channel Sensing Intrinsic Amplitude: 1.1 mV
Lead Channel Sensing Intrinsic Amplitude: 11.4 mV
Lead Channel Setting Pacing Amplitude: 2 V
Lead Channel Setting Pacing Amplitude: 2.5 V
Lead Channel Setting Pacing Pulse Width: 0.5 ms
Lead Channel Setting Sensing Sensitivity: 0.5 mV
Pulse Gen Serial Number: 754815

## 2016-01-03 NOTE — Progress Notes (Signed)
[name]    Electrophysiology Office Note   Date:  01/03/2016   ID:  Brianna Hanson, DOB 28-Apr-1935, MRN [id number]  PCP:  Scarlette Calico, MD  Cardiologist:  Dr Haroldine Laws Primary Electrophysiologist: Thompson Grayer, MD    Chief Complaint  Patient presents with  . Atrial Fibrillation     History of Present Illness: Brianna Hanson is a 81 y.o. female who presents today for electrophysiology evaluation.   Doing amazingly well.  She had gall bladder surgery several months ago and has made good recovery.  Today, she denies symptoms of palpitations, chest pain, shortness of breath, orthopnea, PND,  claudication, dizziness, presyncope, syncope, bleeding, or neurologic sequela. The patient is tolerating medications without difficulties and is otherwise without complaint today.    Past Medical History  Diagnosis Date  . Cardiac arrest - ventricular fibrillation 12/10    with successful resucitation, S/p ICD  . ICD (implantable cardiac defibrillator) in place     she has received appropriate therapy for VF  . Atrial fibrillation or flutter   . Osteopenia   . HTN (hypertension)     moderate  . Nonischemic cardiomyopathy ([place])     followed by Dr April Holding at [facility]  . CHF (congestive heart failure) ([place])   . Anemia   . Plasma cell disorder 03/20/2012  . Plasma cell disorder 03/20/2012  . Sessile colonic polyp 2011    Dr Benson Norway  . Diverticula of colon 2011  . Internal and external hemorrhoids without complication [id number]  . AICD (automatic cardioverter/defibrillator) present    Past Surgical History  Procedure Laterality Date  . Abdominal hysterectomy    . Cardiac catheterization    . Cardiac defibrillator placement      by JA for secondary prevention of sudden death  . Insertion of implantable left ventricular assist device N/A 04/06/2013    Procedure: INSERTION OF IMPLANTABLE LEFT VENTRICULAR ASSIST DEVICE;  Surgeon: Gaye Pollack, MD;  Location: [facility];  Service: Open Heart Surgery;  Laterality:  N/A;  . Placement of centrimag ventricular assist device Right 04/06/2013    Procedure: PLACEMENT OF CENTRIMAG VENTRICULAR ASSIST DEVICE;  Surgeon: Gaye Pollack, MD;  Location: [place];  Service: Open Heart Surgery;  Laterality: Right;  . Intraoperative transesophageal echocardiogram N/A 04/06/2013    Procedure: INTRAOPERATIVE TRANSESOPHAGEAL ECHOCARDIOGRAM;  Surgeon: Gaye Pollack, MD;  Location: [facility] OR;  Service: Open Heart Surgery;  Laterality: N/A;  . Colonoscopy w/ polypectomy  2011    Dr Benson Norway  . Cholecystectomy N/A 10/09/2015    Procedure: LAPAROSCOPIC CHOLECYSTECTOMY;  Surgeon: Rolm Bookbinder, MD;  Location: [facility] OR;  Service: General;  Laterality: N/A;     Current Outpatient Prescriptions  Medication Sig Dispense Refill  . amiodarone (PACERONE) 200 MG tablet Take 0.5 tablets (100 mg total) by mouth daily. 90 tablet 3  . carvedilol (COREG) 6.25 MG tablet Take 1 tablet (6.25 mg total) by mouth 2 (two) times daily with a meal. 60 tablet 5  . Cholecalciferol (VITAMIN D3) 1000 UNITS CAPS Take 1 tablet by mouth daily.      [name] docusate sodium (COLACE) 100 MG capsule Take 1 capsule (100 mg total) by mouth 2 (two) times daily. 60 capsule 6  . ferrous sulfate 325 (65 FE) MG tablet Take 1 tablet (325 mg total) by mouth 2 (two) times daily with a meal. 60 tablet 6  . gabapentin (NEURONTIN) 100 MG capsule TAKE 1 CAPSULE BY MOUTH AT BEDTIME OR AS DIRECTED 30 capsule 5  . hydrALAZINE (  APRESOLINE) 100 MG tablet Take 0.5 tablets (50 mg total) by mouth 3 (three) times daily. 90 tablet 6  . losartan (COZAAR) 50 MG tablet Take 0.5 tablets (25 mg total) by mouth daily. 30 tablet 6  . pantoprazole (PROTONIX) 40 MG tablet TAKE 1 TABLET BY MOUTH EVERY DAY 30 tablet 6  . potassium chloride SA (K-DUR,KLOR-CON) 20 MEQ tablet Take 20 mEq by mouth 2 (two) times daily.    [name] spironolactone (ALDACTONE) 25 MG tablet TAKE 1 TABLET BY MOUTH EVERY DAY 30 tablet 6  . traMADol (ULTRAM) 50 MG tablet Take 1 tablet (50 mg total)  by mouth every 6 (six) hours as needed for moderate pain. for pain 30 tablet 3  . warfarin (COUMADIN) 2.5 MG tablet Take 2.5-5 mg by mouth daily. 2.5mg  daily except 5mg  on Wednesdays     No current facility-administered medications for this visit.    Allergies:   Review of patient's allergies indicates no known allergies.   Social History:  The patient  reports that she has never smoked. She has never used smokeless tobacco. She reports that she does not use illicit drugs.   Family History:  [name] family history includes Stroke in her brother and mother.    ROS:  Please see the history of present illness.   All other systems are reviewed and negative.    PHYSICAL EXAM: VS:  BP 94/72 mmHg  Pulse 73  Ht 5' (1.524 m)  Wt 169 lb (76.658 kg)  BMI 33.01 kg/m2 , BMI Body mass index is 33.01 kg/(m^2). GEN: Well nourished, well developed, in no acute distress HEENT: normal Neck: no JVD, carotid bruits, or masses Cardiac:  + vad hum Respiratory:  clear to auscultation bilaterally, normal work of breathing GI: soft, nontender, nondistended, + BS MS: no deformity or atrophy Skin: warm and dry, device pocket is well healed Neuro:  Strength and sensation are intact Psych: euthymic mood, full affect  EKG:  EKG is ordered today. The ekg ordered today shows sinus rhythm, nonspecific St/T changes  Device interrogation is reviewed today in detail.  See PaceArt for details.   Recent Labs: 05/11/2015: TSH 2.706 07/11/2015: B Natriuretic Peptide 276.6* 10/13/2015: Magnesium 2.2 10/14/2015: ALT 15 01/01/2016: BUN 13; Creatinine, Ser 1.12*; Hemoglobin 10.1*; Platelets 223; Potassium 4.3; Sodium 140    Lipid Panel     Component Value Date/Time   CHOL  05/05/2010 0400    130        ATP III CLASSIFICATION:  <200     mg/dL   Desirable  200-239  mg/dL   Borderline High  >=240    mg/dL   High          TRIG 90 05/05/2010 0400   HDL 36* 05/05/2010 0400   CHOLHDL 3.6 05/05/2010 0400    VLDL 18 05/05/2010 0400   LDLCALC  05/05/2010 0400    76        Total Cholesterol/HDL:CHD Risk Coronary Heart Disease Risk Table                     Men   Women  1/2 Average Risk   3.4   3.3  Average Risk       5.0   4.4  2 X Average Risk   9.6   7.1  3 X Average Risk  23.4   11.0        Use the calculated Patient Ratio above and the CHD Risk Table to determine the  patient's CHD Risk.        ATP III CLASSIFICATION (LDL):  <100     mg/dL   Optimal  100-129  mg/dL   Near or Above                    Optimal  130-159  mg/dL   Borderline  160-189  mg/dL   High  >190     mg/dL   Very High     Wt Readings from Last 3 Encounters:  01/03/16 169 lb (76.658 kg)  01/01/16 167 lb 9.6 oz (76.023 kg)  11/09/15 166 lb 9.6 oz (75.569 kg)    ASSESSMENT AND PLAN:   Assessment and Plan:  1. Chronic systolic dysfunction Doing well s/p LVAD placement  2. Prior VF Normal ICD function See Pace Art report No changes today Continue amiodarone LFTs/TFTs to be followed by advanced heart failure team I have discussed SJM Fortify Assura advisary with the patient today. She understands that recommendation from SJM is to not replace the device at this time. The patient is not device dependant.  The patient has had appropriate device therapy in the past or implanted for secondary prevention.  Vibratory alert demonstrated today.  She is actively remotely monitored and understands the importance of compliance today. She and I agree that we will follow conservatively rather than pursue device replacement at this time.  3. afib No afib since last device interrogation Continue coumadin  [place]  I will see again in the device clinic in 1 year   Current medicines are reviewed at length with the patient today.   The patient does not have concerns regarding her medicines.  The following changes were made today:  none  Signed, Thompson Grayer, MD  01/03/2016 10:23 PM     [place] [address] [phone] (office) [phone] (fax)

## 2016-01-03 NOTE — Patient Instructions (Signed)
Medication Instructions:  Your physician recommends that you continue on your current medications as directed. Please refer to the Current Medication list given to you today.   Labwork: None ordered   Testing/Procedures: None ordered   Follow-Up: Your physician wants you to follow-up in: 12 months with Dr Rayann Heman Dennis Bast will receive a reminder letter in the mail two months in advance. If you don't receive a letter, please call our office to schedule the follow-up appointment.  Remote monitoring is used to monitor your ICD from home. This monitoring reduces the number of office visits required to check your device to one time per year. It allows [place] to keep an eye on the functioning of your device to ensure it is working properly. You are scheduled for a device check from home on 04/03/16. You may send your transmission at any time that day. If you have a wireless device, the transmission will be sent automatically. After your physician reviews your transmission, you will receive a postcard with your next transmission date.     Any Other Special Instructions Will Be Listed Below (If Applicable).     If you need a refill on your cardiac medications before your next appointment, please call your pharmacy.

## 2016-01-07 NOTE — Progress Notes (Signed)
Patient ID: TERYN ICHIKAWA, female   DOB: [birth year], 81 y.o.   MRN: [id number]    ADVANCED HF CLINIC NOTE  Patient ID: Brianna Hanson, female   DOB: 1934/11/19, 81 y.o.   MRN: [id number] Primary Heart Failure: Dr Haroldine Laws Cardiac Surgeon: Dr Prescott Gum  HPI: Ms Ruffing is a 81 year old female with a PMH of HF due to severe NICM, chronic systolic HF,  PAF,  plasma cell disorder (Likely IgA MGUS) - followed by Dr. [name] (follwoed q 6 months). Underwent implantation of the HeartMate II LVAD on 04/06/13 for DT. She is not on aspirin due to dizziness.   Admitted to [facility] from 12/4 to 08/18/15 for acute cholecystitis. Underwent placement of a cholecystotomy drain by IR.  Seen in IR drain clinic and cholangiogram showed patent ducts with residual gallstones.  She was admitted in 2/17 for laparoscopic cholecystectomy.  This was successful.   Here fo routine f/u: Doing great. More and more active. Appetite is back. No SOB. Weight stable. No problems with meds. No bleeding. Urine clear.   Denies LVAD alarms.  Denies driveline trauma, erythema or drainage.  Denies ICD shocks.   Reports taking Coumadin as prescribed.  Denies bright red blood per rectum or melena, no dark urine or hematuria.    Labs (3/17): hgb 11.4, INR 2.36   LVAD interrogation reveals :  Speed: 9200 Flow: 4.3 Power: 5.1 PI: 7.1 Alarms: none Events: 4 - 8 PI events Fixed speed: 9200 Low speed limit: 8600 Primary Controller: Replace back up battery in 28 months Back up controller: Replace back up battery in 28 months  Past Medical History  Diagnosis Date  . Cardiac arrest - ventricular fibrillation 12/10    with successful resucitation, S/p ICD  . ICD (implantable cardiac defibrillator) in place     she has received appropriate therapy for VF  . Atrial fibrillation or flutter   . Osteopenia   . HTN (hypertension)     moderate  . Nonischemic cardiomyopathy ([place])     followed by Dr April Holding at [facility]  . CHF  (congestive heart failure) ([place])   . Anemia   . Plasma cell disorder 03/20/2012  . Plasma cell disorder 03/20/2012  . Sessile colonic polyp 2011    Dr Benson Norway  . Diverticula of colon 2011  . Internal and external hemorrhoids without complication [id number]  . AICD (automatic cardioverter/defibrillator) present     Current Outpatient Prescriptions  Medication Sig Dispense Refill  . amiodarone (PACERONE) 200 MG tablet Take 0.5 tablets (100 mg total) by mouth daily. 90 tablet 3  . Cholecalciferol (VITAMIN D3) 1000 UNITS CAPS Take 1 tablet by mouth daily.      [name] docusate sodium (COLACE) 100 MG capsule Take 1 capsule (100 mg total) by mouth 2 (two) times daily. 60 capsule 6  . ferrous sulfate 325 (65 FE) MG tablet Take 1 tablet (325 mg total) by mouth 2 (two) times daily with a meal. 60 tablet 6  . gabapentin (NEURONTIN) 100 MG capsule TAKE 1 CAPSULE BY MOUTH AT BEDTIME OR AS DIRECTED 30 capsule 5  . hydrALAZINE (APRESOLINE) 100 MG tablet Take 0.5 tablets (50 mg total) by mouth 3 (three) times daily. 90 tablet 6  . losartan (COZAAR) 50 MG tablet Take 0.5 tablets (25 mg total) by mouth daily. 30 tablet 6  . pantoprazole (PROTONIX) 40 MG tablet TAKE 1 TABLET BY MOUTH EVERY DAY 30 tablet 6  . potassium chloride SA (K-DUR,KLOR-CON) 20 MEQ tablet Take 20  mEq by mouth 2 (two) times daily.    [name] spironolactone (ALDACTONE) 25 MG tablet TAKE 1 TABLET BY MOUTH EVERY DAY 30 tablet 6  . traMADol (ULTRAM) 50 MG tablet Take 1 tablet (50 mg total) by mouth every 6 (six) hours as needed for moderate pain. for pain 30 tablet 3  . warfarin (COUMADIN) 2.5 MG tablet Take 2.5-5 mg by mouth daily. 2.5mg  daily except 5mg  on Wednesdays    . carvedilol (COREG) 6.25 MG tablet Take 1 tablet (6.25 mg total) by mouth 2 (two) times daily with a meal. 60 tablet 5   No current facility-administered medications for this encounter.    Review of patient's allergies indicates no known allergies.  REVIEW OF SYSTEMS: All systems negative  except as listed in HPI, PMH and Problem list.   Vital Signs:  BP 92/0 mmHg  Pulse 102  Ht 5' (1.524 m)  Wt 167 lb 9.6 oz (76.023 kg)  BMI 32.73 kg/m2  SpO2 97%  Vital signs: HR: 102 Doppler modified systolic: 92 Auto cuff BP: 136/69 (97) O2 Sat: 97 Wt: 167.6 lbs Last wt: 166.6 lbs Ht: 5'  Physical Exam: GENERAL: Well appearing, female, NAD, [name] present HEENT: normal  NECK: Supple, JVP 6,  2+ bilaterally, no bruits.  No lymphadenopathy or thyromegaly appreciated.   CARDIAC:  LVAD hum present.  LUNGS:  Clear to auscultation bilaterally.  ABDOMEN:  Soft, round, nontender, positive bowel sounds. LVAD exit site: Dressing dry and intact.  No erythema or drainage.  Stabilization device present and accurately applied. Driveline dressing changed weekly. EXTREMITIES:  Warm and dry, no cyanosis, clubbing, rash, edema  NEUROLOGIC:  Alert and oriented x 4.  Gait steady.  No aphasia.  No dysarthria.  Affect pleasant.     ASSESSMENT AND PLAN:  1) Chronic systolic HF: NICM, s/p ICD and LVAD for DT(04/2013).  - Doing well. NYHA I-II. Volume status looks good.  - Continue losartan, spironolactone, and hydralazine 25 mg tid.  BP is up. Add back carvedilol 6.25 bid - Euvolemic, no need for Lasix.  - Reinforced the need and importance of daily weights, a low sodium diet, and fluid restriction (less than 2 L a day). Instructed to call the HF clinic if weight increases more than 3 lbs overnight or 5 lbs in a week.  2) Acute cholecystitis: Now s/p lap cholecystectomy.  Doing well 3) LVAD placed for DT 04/2013: All parameters stable (reviewed personally).  - Check LDH, INR, CBC and BMET today.  - Remains on coumadin.   Not on aspirin due to intolerance. - VAD parameters look good 4) PAF: Stable. Continue amiodarone to 100 mg daily. Follow TSH and LFTs.  Needs year eye exams.    5) HTN: BP up. Restart carvedilol at 6.25 bid     6) Chronic Anticoagulation: Goal INR 2-3 Continue coumadin.  Check INR   Glori Bickers  MD  01/07/2016

## 2016-01-08 ENCOUNTER — Other Ambulatory Visit (HOSPITAL_COMMUNITY): Payer: Self-pay | Admitting: Unknown Physician Specialty

## 2016-01-08 ENCOUNTER — Ambulatory Visit (HOSPITAL_COMMUNITY)
Admission: RE | Admit: 2016-01-08 | Discharge: 2016-01-08 | Disposition: A | Discharge status: Home / Self Care | Payer: Medicare Other | Source: Ambulatory Visit | Attending: Internal Medicine | Admitting: Internal Medicine

## 2016-01-08 ENCOUNTER — Other Ambulatory Visit (HOSPITAL_COMMUNITY): Payer: Self-pay | Admitting: *Deleted

## 2016-01-08 ENCOUNTER — Ambulatory Visit (HOSPITAL_COMMUNITY): Payer: Self-pay | Admitting: Unknown Physician Specialty

## 2016-01-08 DIAGNOSIS — Z7901 Long term (current) use of anticoagulants: Secondary | ICD-10-CM

## 2016-01-08 DIAGNOSIS — I5023 Acute on chronic systolic (congestive) heart failure: Secondary | ICD-10-CM | POA: Diagnosis not present

## 2016-01-08 DIAGNOSIS — Z95811 Presence of heart assist device: Secondary | ICD-10-CM

## 2016-01-08 LAB — PROTIME-INR
INR: 1.88 — ABNORMAL HIGH (ref 0.00–1.49)
Prothrombin Time: 21.5 seconds — ABNORMAL HIGH (ref 11.6–15.2)

## 2016-01-19 ENCOUNTER — Ambulatory Visit (HOSPITAL_COMMUNITY): Payer: Self-pay | Admitting: *Deleted

## 2016-01-19 ENCOUNTER — Ambulatory Visit (HOSPITAL_COMMUNITY)
Admission: RE | Admit: 2016-01-19 | Discharge: 2016-01-19 | Disposition: A | Discharge status: Home / Self Care | Payer: Medicare Other | Source: Ambulatory Visit | Attending: Internal Medicine | Admitting: Internal Medicine

## 2016-01-19 DIAGNOSIS — I5022 Chronic systolic (congestive) heart failure: Secondary | ICD-10-CM | POA: Diagnosis not present

## 2016-01-19 LAB — PROTIME-INR
INR: 2.42 — ABNORMAL HIGH (ref 0.00–1.49)
Prothrombin Time: 26 seconds — ABNORMAL HIGH (ref 11.6–15.2)

## 2016-01-22 ENCOUNTER — Other Ambulatory Visit (HOSPITAL_COMMUNITY): Payer: Self-pay | Admitting: *Deleted

## 2016-01-22 DIAGNOSIS — Z95811 Presence of heart assist device: Secondary | ICD-10-CM

## 2016-01-22 DIAGNOSIS — Z7901 Long term (current) use of anticoagulants: Secondary | ICD-10-CM

## 2016-01-22 MED ORDER — WARFARIN SODIUM 2.5 MG PO TABS: 2.5000 mg | ORAL_TABLET | Freq: Every day | ORAL | Status: DC

## 2016-01-31 ENCOUNTER — Other Ambulatory Visit (HOSPITAL_COMMUNITY): Payer: Self-pay | Admitting: Infectious Diseases

## 2016-01-31 DIAGNOSIS — Z79899 Other long term (current) drug therapy: Secondary | ICD-10-CM

## 2016-01-31 DIAGNOSIS — I5022 Chronic systolic (congestive) heart failure: Secondary | ICD-10-CM

## 2016-01-31 DIAGNOSIS — Z5181 Encounter for therapeutic drug level monitoring: Secondary | ICD-10-CM

## 2016-01-31 DIAGNOSIS — Z95811 Presence of heart assist device: Secondary | ICD-10-CM

## 2016-02-01 ENCOUNTER — Ambulatory Visit (HOSPITAL_COMMUNITY)
Admission: RE | Admit: 2016-02-01 | Discharge: 2016-02-01 | Disposition: A | Discharge status: Home / Self Care | Payer: Medicare Other | Source: Ambulatory Visit | Attending: Internal Medicine | Admitting: Internal Medicine

## 2016-02-01 ENCOUNTER — Ambulatory Visit (HOSPITAL_COMMUNITY): Payer: Self-pay | Admitting: Unknown Physician Specialty

## 2016-02-01 VITALS — BP 110/0 | HR 76 | Ht 60.0 in | Wt 169.8 lb

## 2016-02-01 DIAGNOSIS — Z95811 Presence of heart assist device: Secondary | ICD-10-CM | POA: Diagnosis not present

## 2016-02-01 DIAGNOSIS — I480 Paroxysmal atrial fibrillation: Secondary | ICD-10-CM | POA: Insufficient documentation

## 2016-02-01 DIAGNOSIS — Z5181 Encounter for therapeutic drug level monitoring: Secondary | ICD-10-CM | POA: Diagnosis not present

## 2016-02-01 DIAGNOSIS — I5022 Chronic systolic (congestive) heart failure: Secondary | ICD-10-CM

## 2016-02-01 DIAGNOSIS — I10 Essential (primary) hypertension: Secondary | ICD-10-CM | POA: Diagnosis not present

## 2016-02-01 DIAGNOSIS — Z8674 Personal history of sudden cardiac arrest: Secondary | ICD-10-CM | POA: Insufficient documentation

## 2016-02-01 DIAGNOSIS — I428 Other cardiomyopathies: Secondary | ICD-10-CM | POA: Insufficient documentation

## 2016-02-01 DIAGNOSIS — Z79899 Other long term (current) drug therapy: Secondary | ICD-10-CM

## 2016-02-01 DIAGNOSIS — M8580 Other specified disorders of bone density and structure, unspecified site: Secondary | ICD-10-CM | POA: Insufficient documentation

## 2016-02-01 DIAGNOSIS — Z9889 Other specified postprocedural states: Secondary | ICD-10-CM | POA: Diagnosis not present

## 2016-02-01 DIAGNOSIS — I110 Hypertensive heart disease with heart failure: Secondary | ICD-10-CM | POA: Insufficient documentation

## 2016-02-01 DIAGNOSIS — Z7901 Long term (current) use of anticoagulants: Secondary | ICD-10-CM | POA: Diagnosis not present

## 2016-02-01 LAB — COMPREHENSIVE METABOLIC PANEL
ALT: 20 U/L (ref 14–54)
AST: 25 U/L (ref 15–41)
Albumin: 3.1 g/dL — ABNORMAL LOW (ref 3.5–5.0)
Alkaline Phosphatase: 73 U/L (ref 38–126)
Anion gap: 9 (ref 5–15)
BUN: 12 mg/dL (ref 6–20)
CO2: 19 mmol/L — ABNORMAL LOW (ref 22–32)
Calcium: 9.3 mg/dL (ref 8.9–10.3)
Chloride: 111 mmol/L (ref 101–111)
Creatinine, Ser: 1.01 mg/dL — ABNORMAL HIGH (ref 0.44–1.00)
GFR calc Af Amer: 60 mL/min — ABNORMAL LOW (ref >60)
GFR calc non Af Amer: 52 mL/min — ABNORMAL LOW (ref >60)
Glucose, Bld: 88 mg/dL (ref 65–99)
Potassium: 3.7 mmol/L (ref 3.5–5.1)
Sodium: 139 mmol/L (ref 135–145)
Total Bilirubin: 0.4 mg/dL (ref 0.3–1.2)
Total Protein: 7.5 g/dL (ref 6.5–8.1)

## 2016-02-01 LAB — CBC
HCT: 35.4 % — ABNORMAL LOW (ref 36.0–46.0)
Hemoglobin: 11 g/dL — ABNORMAL LOW (ref 12.0–15.0)
MCH: 29.4 pg (ref 26.0–34.0)
MCHC: 31.1 g/dL (ref 30.0–36.0)
MCV: 94.7 fL (ref 78.0–100.0)
Platelets: 187 10*3/uL (ref 150–400)
RBC: 3.74 MIL/uL — ABNORMAL LOW (ref 3.87–5.11)
RDW: 17 % — ABNORMAL HIGH (ref 11.5–15.5)
WBC: 4.7 10*3/uL (ref 4.0–10.5)

## 2016-02-01 LAB — PROTIME-INR
INR: 3.26 — ABNORMAL HIGH (ref 0.00–1.49)
Prothrombin Time: 32.6 seconds — ABNORMAL HIGH (ref 11.6–15.2)

## 2016-02-01 LAB — LACTATE DEHYDROGENASE: LDH: 214 U/L — ABNORMAL HIGH (ref 98–192)

## 2016-02-01 LAB — TSH: TSH: 2.511 u[IU]/mL (ref 0.350–4.500)

## 2016-02-01 MED ORDER — SPIRONOLACTONE 25 MG PO TABS: 12.5000 mg | ORAL_TABLET | Freq: Every day | ORAL | Status: DC

## 2016-02-01 NOTE — Progress Notes (Signed)
Symptom  Yes  No  Details   Angina        x Activity:   Claudication        x How far:   Syncope        x When:   Stroke        x   Orthopnea        x How many pillows:  1  PND        x How often:  CPAP     N/A How many hrs:   Pedal edema        x   Abd fullness             x   N&V        x Good appetite; early satiety   Diaphoresis             x When:  Nightly (normal)  Bleeding       x Dark stools from iron   Urine color            x Medium yellow  SOB              x Activity:   Palpitations         x When:  ICD shock         x   Hospitlizaitons             x When/where/why:   ED visit         x When/where/why:   Other MD        x When/who/why:  Activity        x Improved  Fluid      2 liters/day  Diet    Low salt   Vital signs: HR:  99 Doppler modified systolic: [id number] Auto cuff BP:  104/74 (84) O2 Sat:  99 Wt:  169.8  lbs Last wt: 167.6  lbs Ht:  5'  LVAD interrogation reveals (reviewed with Dr Aundra Dubin):  Speed:   9200 Flow:   3.6 Power:   4.6 PI:  7.2 Alarms:  Low Flow on 5/16 and 5/22 Pt states that she did not feel any different on those days and did not hear any alarms.  Events:   4 - 15 PI events Fixed speed:   9200 Low speed limit:  8600 Primary Controller:  Replace back up battery in 27  months Back up controller:   Replace back up battery in 27  months   LVAD exit site:  Well healed and incorporated. The velour is fully implanted at exit site. Sorbaview dressing dry and intact. Stabiliization device present and accurately applied. Driveline dressing is being changed  weekly per sterile technique using Sorbaview dressing with biopatch on exit site. Pt denies fever or chills. Pt/caregiver provided with adequate dressing supplies for home.  I reviewed the LVAD parameters from today, and compared the results to the patient's prior recorded data. No programming changes were made. The LVAD is functioning within specified parameters.  LVAD interrogation was POSITIVE  for LOW FLOW on 5/16 and 5/22; a few PI events/speed drops present which is consistent with the patient's history with the exception of an isolated day on 5/27 with 25 PI events-Pt states that she was at the park on this day and it was hot outside. [name] encouraged the patient to drink more water and stay hydrated.   Pt/caregiver deny any alarms or VAD equipment issues. VAD equipment check completed and is  in good working order. Back-up equipment present. LVAD education done on emergency procedures and precautions and reviewed exit site care.   Patient Instructions:  1.  Decrease Spironolactone to 12.5 mg daily (half pill). 2. Return for a RAMP echo in 2 weeks.    Tanda Rockers RN, VAD coordinator

## 2016-02-01 NOTE — Patient Instructions (Signed)
1.  Decrease Spironolactone to 12.5 mg daily (half pill). 2. Return for a RAMP echo in 2 weeks.

## 2016-02-01 NOTE — Progress Notes (Signed)
Patient ID: Brianna Hanson, female   DOB: 1935/08/03, 81 y.o.   MRN: [id number] Patient ID: Brianna Hanson, female   DOB: 01/13/1935, 81 y.o.   MRN: [id number]    ADVANCED HF CLINIC NOTE  Patient ID: Brianna Hanson, female   DOB: 1934-10-21, 81 y.o.   MRN: [id number] Primary Heart Failure: Dr Haroldine Laws Cardiac Surgeon: Dr Prescott Gum  HPI: Brianna Hanson is a 81 year old female with a PMH of HF due to severe NICM, chronic systolic HF,  PAF,  plasma cell disorder (Likely IgA MGUS) - followed by Dr. [name] (follwoed q 6 months). Underwent implantation of the HeartMate II LVAD on 04/06/13 for DT. She is not on aspirin due to dizziness.   Admitted to [facility] from 12/4 to 08/18/15 for acute cholecystitis. Underwent placement of a cholecystotomy drain by IR.  Seen in IR drain clinic and cholangiogram showed patent ducts with residual gallstones.  She was admitted in 2/17 for laparoscopic cholecystectomy.  This was successful.   Here fo routine f/u: Feels great. Can do anything she wants. Denies SOB, edema, orthopnea or PND. Appetite is back.  Weight stable. No problems with meds. No bleeding. Urine clear.   Denies LVAD alarms.  Denies driveline trauma, erythema or drainage.  Denies ICD shocks.   Reports taking Coumadin as prescribed.  Denies bright red blood per rectum or melena, no dark urine or hematuria.    Labs (3/17): hgb 11.4, INR 2.36   LVAD interrogation reveals (reviewed with Dr Aundra Dubin):  Speed: 9200 Flow: 3.6 Power: 4.6 PI: 7.2 Alarms: Low Flow on 5/16 and 5/22 Pt states that she did not feel any different on those days and did not hear any alarms.  Events: 4 - 15 PI events Fixed speed: 9200 Low speed limit: 8600 Primary Controller: Replace back up battery in 27 months Back up controller: Replace back up battery in 27 months  Past Medical History  Diagnosis Date  . Cardiac arrest - ventricular fibrillation 12/10    with successful resucitation, S/p ICD  . ICD (implantable  cardiac defibrillator) in place     she has received appropriate therapy for VF  . Atrial fibrillation or flutter   . Osteopenia   . HTN (hypertension)     moderate  . Nonischemic cardiomyopathy ([place])     followed by Dr April Holding at [facility]  . CHF (congestive heart failure) ([place])   . Anemia   . Plasma cell disorder 03/20/2012  . Plasma cell disorder 03/20/2012  . Sessile colonic polyp 2011    Dr Benson Norway  . Diverticula of colon 2011  . Internal and external hemorrhoids without complication [id number]  . AICD (automatic cardioverter/defibrillator) present     Current Outpatient Prescriptions  Medication Sig Dispense Refill  . amiodarone (PACERONE) 200 MG tablet Take 0.5 tablets (100 mg total) by mouth daily. 90 tablet 3  . carvedilol (COREG) 6.25 MG tablet Take 1 tablet (6.25 mg total) by mouth 2 (two) times daily with a meal. 60 tablet 5  . Cholecalciferol (VITAMIN D3) 1000 UNITS CAPS Take 1 tablet by mouth daily.      [name] docusate sodium (COLACE) 100 MG capsule Take 1 capsule (100 mg total) by mouth 2 (two) times daily. 60 capsule 6  . ferrous sulfate 325 (65 FE) MG tablet Take 1 tablet (325 mg total) by mouth 2 (two) times daily with a meal. 60 tablet 6  . gabapentin (NEURONTIN) 100 MG capsule TAKE 1 CAPSULE BY MOUTH AT BEDTIME OR  AS DIRECTED 30 capsule 5  . hydrALAZINE (APRESOLINE) 100 MG tablet Take 0.5 tablets (50 mg total) by mouth 3 (three) times daily. 90 tablet 6  . losartan (COZAAR) 50 MG tablet Take 0.5 tablets (25 mg total) by mouth daily. 30 tablet 6  . pantoprazole (PROTONIX) 40 MG tablet TAKE 1 TABLET BY MOUTH EVERY DAY 30 tablet 6  . spironolactone (ALDACTONE) 25 MG tablet Take 0.5 tablets (12.5 mg total) by mouth daily. 30 tablet 6  . warfarin (COUMADIN) 2.5 MG tablet Take 1-2 tablets (2.5-5 mg total) by mouth daily. 2.5mg  daily except 5mg  on Wednesdays 60 tablet 11  . potassium chloride SA (K-DUR,KLOR-CON) 20 MEQ tablet Take 20 mEq by mouth 2 (two) times daily.    . traMADol  (ULTRAM) 50 MG tablet Take 1 tablet (50 mg total) by mouth every 6 (six) hours as needed for moderate pain. for pain (Patient not taking: Reported on 02/01/2016) 30 tablet 3   No current facility-administered medications for this encounter.    Review of patient's allergies indicates no known allergies.  REVIEW OF SYSTEMS: All systems negative except as listed in HPI, PMH and Problem list.   Vital Signs:  BP 110/0 mmHg  Pulse 76  Ht 5' (1.524 m)  Wt 169 lb 12.8 oz (77.021 kg)  BMI 33.16 kg/m2  SpO2 99%  Vital signs: HR: 99 Doppler modified systolic: [id number] Auto cuff BP: 104/74 (84) O2 Sat: 99 Wt: 169.8 lbs Last wt: 167.6 lbs Ht: 5'  Physical Exam: GENERAL: Well appearing, female, NAD, [name] present HEENT: normal  NECK: Supple, JVP flat,  2+ bilaterally, no bruits.  No lymphadenopathy or thyromegaly appreciated.   CARDIAC:  LVAD hum present.  LUNGS:  Clear to auscultation bilaterally.  ABDOMEN:  Soft, round, nontender, positive bowel sounds. LVAD exit site: Dressing dry and intact.  No erythema or drainage.  Stabilization device present and accurately applied. Driveline dressing changed weekly. EXTREMITIES:  Warm and dry, no cyanosis, clubbing, rash, edema  NEUROLOGIC:  Alert and oriented x 4.  Gait steady.  No aphasia.  No dysarthria.  Affect pleasant.     ASSESSMENT AND PLAN:  1) Chronic systolic HF: NICM, s/p ICD and LVAD for DT(04/2013).  - Doing well. NYHA I-II. Volume status looks good.  - Continue losartan, hydralazine 25 mg tid andcarvedilol 6.25 bid - Euvolemic, no need for Lasix.  - She has a few low flow alarms on VAD interrogation. When sitting down Flow in low 3s. When lying down flow 4.1L on standing flow goes to 2.8L. Suspect she is volume depleted. Cut spiro to 12.5. We will do ramp echo next week.  - Reinforced the need and importance of daily weights, a low sodium diet, and fluid restriction (less than 2 L a day). Instructed to call the HF clinic if  weight increases more than 3 lbs overnight or 5 lbs in a week.  2) Acute cholecystitis: Now s/p lap cholecystectomy.  Doing well 3) LVAD placed for DT 04/2013: All parameters stable (reviewed personally).  - Check LDH, INR, CBC and BMET today.  - Remains on coumadin.   Not on aspirin due to intolerance. - Some low flows on interrogation. See discussion above.  4) PAF: Stable. Continue amiodarone to 100 mg daily. Follow TSH and LFTs.  Needs year eye exams.    5) HTN: BP improved with restarting carvedilol at 6.25 bid     6) Chronic Anticoagulation: Goal INR 2-3 Continue coumadin. Check INR   [name], Quillian Quince  MD  02/01/2016

## 2016-02-07 ENCOUNTER — Other Ambulatory Visit (HOSPITAL_COMMUNITY): Payer: Self-pay | Admitting: Unknown Physician Specialty

## 2016-02-07 DIAGNOSIS — Z95811 Presence of heart assist device: Secondary | ICD-10-CM

## 2016-02-07 DIAGNOSIS — Z7901 Long term (current) use of anticoagulants: Secondary | ICD-10-CM

## 2016-02-08 ENCOUNTER — Ambulatory Visit (HOSPITAL_COMMUNITY)
Admission: RE | Admit: 2016-02-08 | Discharge: 2016-02-08 | Disposition: A | Discharge status: Home / Self Care | Payer: Medicare Other | Source: Ambulatory Visit | Attending: Internal Medicine | Admitting: Internal Medicine

## 2016-02-08 ENCOUNTER — Ambulatory Visit (HOSPITAL_COMMUNITY): Payer: Self-pay | Admitting: Infectious Diseases

## 2016-02-08 DIAGNOSIS — I5023 Acute on chronic systolic (congestive) heart failure: Secondary | ICD-10-CM

## 2016-02-08 DIAGNOSIS — Z7901 Long term (current) use of anticoagulants: Secondary | ICD-10-CM | POA: Diagnosis not present

## 2016-02-08 DIAGNOSIS — Z5181 Encounter for therapeutic drug level monitoring: Secondary | ICD-10-CM | POA: Insufficient documentation

## 2016-02-08 DIAGNOSIS — Z95811 Presence of heart assist device: Secondary | ICD-10-CM | POA: Diagnosis not present

## 2016-02-08 LAB — PROTIME-INR
INR: 3.46 — ABNORMAL HIGH (ref 0.00–1.49)
Prothrombin Time: 34.1 seconds — ABNORMAL HIGH (ref 11.6–15.2)

## 2016-02-15 ENCOUNTER — Other Ambulatory Visit (HOSPITAL_COMMUNITY): Payer: Self-pay | Admitting: Internal Medicine

## 2016-02-16 ENCOUNTER — Encounter (HOSPITAL_COMMUNITY): Payer: Self-pay | Admitting: Infectious Diseases

## 2016-02-16 ENCOUNTER — Ambulatory Visit (HOSPITAL_BASED_OUTPATIENT_CLINIC_OR_DEPARTMENT_OTHER)
Admission: RE | Admit: 2016-02-16 | Discharge: 2016-02-16 | Disposition: A | Discharge status: Home / Self Care | Payer: Medicare Other | Source: Ambulatory Visit | Attending: Internal Medicine | Admitting: Internal Medicine

## 2016-02-16 ENCOUNTER — Other Ambulatory Visit (HOSPITAL_COMMUNITY): Payer: Self-pay | Admitting: Infectious Diseases

## 2016-02-16 ENCOUNTER — Ambulatory Visit (HOSPITAL_COMMUNITY)
Admission: RE | Admit: 2016-02-16 | Discharge: 2016-02-16 | Disposition: A | Discharge status: Home / Self Care | Payer: Medicare Other | Source: Ambulatory Visit | Attending: Internal Medicine | Admitting: Internal Medicine

## 2016-02-16 ENCOUNTER — Ambulatory Visit (HOSPITAL_COMMUNITY): Payer: Self-pay | Admitting: Infectious Diseases

## 2016-02-16 DIAGNOSIS — I5022 Chronic systolic (congestive) heart failure: Secondary | ICD-10-CM

## 2016-02-16 DIAGNOSIS — Z95811 Presence of heart assist device: Secondary | ICD-10-CM | POA: Diagnosis not present

## 2016-02-16 DIAGNOSIS — Z5181 Encounter for therapeutic drug level monitoring: Secondary | ICD-10-CM | POA: Diagnosis not present

## 2016-02-16 DIAGNOSIS — Z7901 Long term (current) use of anticoagulants: Secondary | ICD-10-CM

## 2016-02-16 DIAGNOSIS — I509 Heart failure, unspecified: Secondary | ICD-10-CM | POA: Diagnosis present

## 2016-02-16 LAB — PROTIME-INR
INR: 1.84 — ABNORMAL HIGH (ref 0.00–1.49)
Prothrombin Time: 21.2 seconds — ABNORMAL HIGH (ref 11.6–15.2)

## 2016-02-16 NOTE — Progress Notes (Unsigned)
Speed  Flow  PI  Power  LVIDD  AI  Aortic openings  MR  TR  Septum  RV                   9000  3.6 8.1 4.4w 4.06 cm  trace Sl open trace trace Sl bow to R at apex Mild red, dilated  9200  3.2 7.2 4.5w 3.63  cm trace Sl open 5/5 trace trace Sl bow to R at apex     Starting speed 9200. Compared to 03/2014 study on same speed LVIDD is decreased on same speed from 3.92 >> 3.63.     Ramp ECHO performed in echo suite with myself and [name].   At completion of ramp study, patients primary and back up controller programmed:   Fixed speed: 9000 Low speed limit: 8400

## 2016-02-16 NOTE — Progress Notes (Signed)
  Echocardiogram 2D Echocardiogram has been performed.  Brianna Hanson 02/16/2016, 11:19 AM

## 2016-02-23 ENCOUNTER — Ambulatory Visit (HOSPITAL_COMMUNITY)
Admission: RE | Admit: 2016-02-23 | Discharge: 2016-02-23 | Disposition: A | Discharge status: Home / Self Care | Payer: Medicare Other | Source: Ambulatory Visit | Attending: Cardiology | Admitting: Cardiology

## 2016-02-23 ENCOUNTER — Ambulatory Visit (HOSPITAL_COMMUNITY): Payer: Self-pay | Admitting: Unknown Physician Specialty

## 2016-02-23 DIAGNOSIS — Z95811 Presence of heart assist device: Secondary | ICD-10-CM

## 2016-02-23 DIAGNOSIS — Z7901 Long term (current) use of anticoagulants: Secondary | ICD-10-CM | POA: Diagnosis not present

## 2016-02-23 DIAGNOSIS — Z5181 Encounter for therapeutic drug level monitoring: Secondary | ICD-10-CM | POA: Insufficient documentation

## 2016-02-23 LAB — PROTIME-INR
INR: 2.64 — ABNORMAL HIGH (ref 0.00–1.49)
Prothrombin Time: 27.8 seconds — ABNORMAL HIGH (ref 11.6–15.2)

## 2016-03-03 ENCOUNTER — Other Ambulatory Visit (HOSPITAL_COMMUNITY): Payer: Self-pay | Admitting: Adult Health

## 2016-03-08 ENCOUNTER — Ambulatory Visit (HOSPITAL_COMMUNITY): Payer: Self-pay | Admitting: Infectious Diseases

## 2016-03-08 ENCOUNTER — Encounter (HOSPITAL_COMMUNITY): Payer: Self-pay | Admitting: Infectious Diseases

## 2016-03-08 ENCOUNTER — Ambulatory Visit (HOSPITAL_COMMUNITY)
Admission: RE | Admit: 2016-03-08 | Discharge: 2016-03-08 | Disposition: A | Discharge status: Home / Self Care | Payer: Medicare Other | Source: Ambulatory Visit | Attending: Internal Medicine | Admitting: Internal Medicine

## 2016-03-08 ENCOUNTER — Other Ambulatory Visit (HOSPITAL_COMMUNITY): Payer: Self-pay | Admitting: Unknown Physician Specialty

## 2016-03-08 DIAGNOSIS — Z95811 Presence of heart assist device: Secondary | ICD-10-CM

## 2016-03-08 DIAGNOSIS — Z5181 Encounter for therapeutic drug level monitoring: Secondary | ICD-10-CM | POA: Insufficient documentation

## 2016-03-08 DIAGNOSIS — Z7901 Long term (current) use of anticoagulants: Secondary | ICD-10-CM

## 2016-03-08 LAB — PROTIME-INR
INR: 1.9 — ABNORMAL HIGH (ref 0.00–1.49)
Prothrombin Time: 21.7 seconds — ABNORMAL HIGH (ref 11.6–15.2)

## 2016-03-12 ENCOUNTER — Other Ambulatory Visit: Payer: Self-pay | Admitting: Internal Medicine

## 2016-03-18 ENCOUNTER — Ambulatory Visit (HOSPITAL_COMMUNITY)
Admission: RE | Admit: 2016-03-18 | Discharge: 2016-03-18 | Disposition: A | Discharge status: Home / Self Care | Payer: Medicare Other | Source: Ambulatory Visit | Attending: Cardiology | Admitting: Cardiology

## 2016-03-18 ENCOUNTER — Encounter (HOSPITAL_COMMUNITY): Payer: Self-pay | Admitting: *Deleted

## 2016-03-18 ENCOUNTER — Ambulatory Visit (HOSPITAL_COMMUNITY): Payer: Self-pay | Admitting: *Deleted

## 2016-03-18 DIAGNOSIS — Z7901 Long term (current) use of anticoagulants: Secondary | ICD-10-CM | POA: Insufficient documentation

## 2016-03-18 DIAGNOSIS — Z5181 Encounter for therapeutic drug level monitoring: Secondary | ICD-10-CM | POA: Diagnosis not present

## 2016-03-18 LAB — PROTIME-INR
INR: 2.03 — ABNORMAL HIGH (ref 0.00–1.49)
Prothrombin Time: 22.8 seconds — ABNORMAL HIGH (ref 11.6–15.2)

## 2016-04-02 ENCOUNTER — Other Ambulatory Visit (HOSPITAL_COMMUNITY): Payer: Self-pay | Admitting: Unknown Physician Specialty

## 2016-04-02 ENCOUNTER — Ambulatory Visit (HOSPITAL_COMMUNITY): Payer: Self-pay | Admitting: Unknown Physician Specialty

## 2016-04-02 ENCOUNTER — Ambulatory Visit (HOSPITAL_COMMUNITY)
Admission: RE | Admit: 2016-04-02 | Discharge: 2016-04-02 | Disposition: A | Discharge status: Home / Self Care | Payer: Medicare Other | Source: Ambulatory Visit | Attending: Cardiology | Admitting: Cardiology

## 2016-04-02 VITALS — BP 118/0 | HR 77 | Ht 60.0 in | Wt 171.6 lb

## 2016-04-02 DIAGNOSIS — Z95811 Presence of heart assist device: Secondary | ICD-10-CM | POA: Diagnosis not present

## 2016-04-02 DIAGNOSIS — Z7901 Long term (current) use of anticoagulants: Secondary | ICD-10-CM

## 2016-04-02 DIAGNOSIS — I509 Heart failure, unspecified: Secondary | ICD-10-CM

## 2016-04-02 DIAGNOSIS — Z8674 Personal history of sudden cardiac arrest: Secondary | ICD-10-CM | POA: Diagnosis not present

## 2016-04-02 DIAGNOSIS — I110 Hypertensive heart disease with heart failure: Secondary | ICD-10-CM | POA: Insufficient documentation

## 2016-04-02 DIAGNOSIS — I10 Essential (primary) hypertension: Secondary | ICD-10-CM

## 2016-04-02 DIAGNOSIS — I428 Other cardiomyopathies: Secondary | ICD-10-CM | POA: Insufficient documentation

## 2016-04-02 DIAGNOSIS — Z79899 Other long term (current) drug therapy: Secondary | ICD-10-CM | POA: Diagnosis not present

## 2016-04-02 DIAGNOSIS — I480 Paroxysmal atrial fibrillation: Secondary | ICD-10-CM | POA: Insufficient documentation

## 2016-04-02 DIAGNOSIS — Z9049 Acquired absence of other specified parts of digestive tract: Secondary | ICD-10-CM

## 2016-04-02 DIAGNOSIS — I5022 Chronic systolic (congestive) heart failure: Secondary | ICD-10-CM | POA: Diagnosis not present

## 2016-04-02 DIAGNOSIS — I50810 Right heart failure, unspecified: Secondary | ICD-10-CM

## 2016-04-02 LAB — CBC
HCT: 37.5 % (ref 36.0–46.0)
Hemoglobin: 11.9 g/dL — ABNORMAL LOW (ref 12.0–15.0)
MCH: 30.7 pg (ref 26.0–34.0)
MCHC: 31.7 g/dL (ref 30.0–36.0)
MCV: 96.9 fL (ref 78.0–100.0)
Platelets: 163 10*3/uL (ref 150–400)
RBC: 3.87 MIL/uL (ref 3.87–5.11)
RDW: 15.2 % (ref 11.5–15.5)
WBC: 6.2 10*3/uL (ref 4.0–10.5)

## 2016-04-02 LAB — BASIC METABOLIC PANEL
Anion gap: 4 — ABNORMAL LOW (ref 5–15)
BUN: 19 mg/dL (ref 6–20)
CO2: 23 mmol/L (ref 22–32)
Calcium: 9.5 mg/dL (ref 8.9–10.3)
Chloride: 110 mmol/L (ref 101–111)
Creatinine, Ser: 1.04 mg/dL — ABNORMAL HIGH (ref 0.44–1.00)
GFR calc Af Amer: 58 mL/min — ABNORMAL LOW (ref >60)
GFR calc non Af Amer: 50 mL/min — ABNORMAL LOW (ref >60)
Glucose, Bld: 88 mg/dL (ref 65–99)
Potassium: 4.3 mmol/L (ref 3.5–5.1)
Sodium: 137 mmol/L (ref 135–145)

## 2016-04-02 LAB — PROTIME-INR
INR: 1.88
Prothrombin Time: 21.8 seconds — ABNORMAL HIGH (ref 11.4–15.2)

## 2016-04-02 LAB — PREALBUMIN: Prealbumin: 19.8 mg/dL (ref 18–38)

## 2016-04-02 LAB — LACTATE DEHYDROGENASE: LDH: 212 U/L — ABNORMAL HIGH (ref 98–192)

## 2016-04-02 MED ORDER — LOSARTAN POTASSIUM 50 MG PO TABS: 50.0000 mg | ORAL_TABLET | Freq: Every day | ORAL | 6 refills | Status: DC

## 2016-04-02 NOTE — Patient Instructions (Addendum)
1. Increase Losartan to 50 mg daily. 2. Increase Coumadin to 2.5 mg on Sunday and Thursday, all other days 10 mg. 3. Return to clinic for INR on 8/7. 4. Return to [place] clinic in 2 months.

## 2016-04-02 NOTE — Progress Notes (Signed)
Symptom  Yes  No  Details   Angina        x Activity:   Claudication        x How far:   Syncope        x When:   Stroke        x   Orthopnea        x How many pillows:  1  PND        x How often:  CPAP     N/A How many hrs:   Pedal edema        x   Abd fullness             x   N&V        x Good appetite; early satiety   Diaphoresis             x When:  Nightly (normal)  Bleeding x       Pt states that she has bright red blood rarely from hemorrhoids   Urine color            x  yellow  SOB       x        Activity: inclines  Palpitations         x When:  ICD shock         x   Hospitlizaitons             x When/where/why:   ED visit         x When/where/why:   Other MD        x When/who/why:  Activity        x Improved  Fluid      2 liters/day  Diet    Low salt   Vital signs: HR:  77 Doppler modified systolic: 123456 Auto cuff BP:  149/100 (129) O2 Sat:  100 Wt:  171.6  lbs Last wt: 169.8  lbs Ht:  5'  LVAD interrogation reveals (reviewed with Dr Aundra Dubin):  Speed:   9000 Flow:   3.4 Power:   4.3 PI:  7.3 Alarms:  none Events:   4 - 8 PI events Fixed speed:   9200 Low speed limit:  8600 Primary Controller:  Replace back up battery in 25  months Back up controller:   Replace back up battery in 25 months   LVAD exit site:  Well healed and incorporated. The velour is fully implanted at exit site. Sorbaview dressing dry and intact. Stabiliization device present and accurately applied. Driveline dressing is being changed  weekly per sterile technique using Sorbaview dressing with biopatch on exit site. Pt denies fever or chills. Pt/caregiver provided with adequate dressing supplies for home.  I reviewed the LVAD parameters from today, and compared the results to the patient's prior recorded data. No programming changes were made. The LVAD is functioning within specified parameters.  LVAD interrogation was positive for low flow x 2 on 04/01/16 no other significant power changes or  alarms; a few PI events/speed drops present which is consistent with the patient's history.  Pt/caregiver deny any alarms or VAD equipment issues. VAD equipment check completed and is in good working order. Back-up equipment present. LVAD education done on emergency procedures and precautions  and reviewed exit site care.   Controller Change Out  HeartMate II System Controller SW Version 7.29 upgrade performed in the hospital today to accommodate new recommendations from [employer]. Consent reviewed and signed. Tem Tech  representative Blair Heys) performed software upgrade. Self-test was performed and passed on upgraded controller prior to the exchange. Bedside nurse performed elective controller exchange with VAD Coordinator present. Patient tolerated controller exchange well and was asymptomatic. Pump restarted as expected with stable VAD parameters on correct prescribed speed of 9000 / 8400 rpms.   Current:   Back Up Controller: [id number], Exp: 04/09/2017)  Primary Controller: [id number], Exp: 05/05/2017)  Patient Instructions:  1. Increase Losartan to 50 mg daily. 2. Increase Coumadin to 2.5 mg on Sunday and Thursday, all other days 10 mg. 3. Return to clinic for INR on 8/7. 4. Return to [place] clinic in 2 months.       Tanda Rockers RN, VAD coordinator

## 2016-04-02 NOTE — Progress Notes (Signed)
ADVANCED HF CLINIC NOTE  Patient ID: Brianna Hanson, female   DOB: 1935/04/07, 81 y.o.   MRN: [id number] Primary Heart Failure: Brianna Hanson Cardiac Surgeon: Brianna Hanson  HPI: Brianna Hanson is a 81 year old female with a PMH of HF due to severe NICM, chronic systolic HF,  PAF,  plasma cell disorder (Likely IgA MGUS) - followed by Brianna. Alen Hanson (follwoed q 6 months). Underwent implantation of the HeartMate II LVAD on 04/06/13 for DT. She is not on aspirin due to dizziness.   Admitted to [facility] from 12/4 to 08/18/15 for acute cholecystitis. Underwent placement of a cholecystotomy drain by IR.  Seen in IR drain clinic and cholangiogram showed patent ducts with residual gallstones.  She was admitted in 2/17 for laparoscopic cholecystectomy.  This was successful.   She returns for LVAD follow up. Last visit spiro cut back and LVAD speed cut back Overall feeling good. Denies SOB/PND/Orthopnea. No abdominal pain. No bleeding problems. Weight stable. No problems with meds. No bleeding. Urine clear.   Denies LVAD alarms.  Denies driveline trauma, erythema or drainage.  Denies ICD shocks.   Reports taking Coumadin as prescribed.  Denies bright red blood per rectum or melena, no dark urine or hematuria.    Labs (3/17): hgb 11.4, INR 2.36  LVAD interrogation reveals :  Speed: 9000 Flow: 3.4 Power: 4.3 PI: 7.3 Alarms: none Events: 4 - 8 PI events Fixed speed: 9000 Low speed limit: 8400 Primary Controller: Replace back up battery in 27 months Back up controller: Replace back up battery in 27 months  Past Medical History:  Diagnosis Date  . AICD (automatic cardioverter/defibrillator) present   . Anemia   . Atrial fibrillation or flutter   . Cardiac arrest - ventricular fibrillation 12/10   with successful resucitation, S/p ICD  . CHF (congestive heart failure) ([place])   . Diverticula of colon 2011  . HTN (hypertension)    moderate  . ICD (implantable cardiac defibrillator) in place     she has received appropriate therapy for VF  . Internal and external hemorrhoids without complication [id number]  . Nonischemic cardiomyopathy ([place])    followed by Brianna Hanson at [facility]  . Osteopenia   . Plasma cell disorder 03/20/2012  . Plasma cell disorder 03/20/2012  . Sessile colonic polyp 2011   Brianna Hanson    Current Outpatient Prescriptions  Medication Sig Dispense Refill  . amiodarone (PACERONE) 200 MG tablet Take 0.5 tablets (100 mg total) by mouth daily. 90 tablet 3  . carvedilol (COREG) 6.25 MG tablet Take 1 tablet (6.25 mg total) by mouth 2 (two) times daily with a meal. 60 tablet 5  . Cholecalciferol (VITAMIN D3) 1000 UNITS CAPS Take 1 tablet by mouth daily.      . ferrous sulfate 325 (65 FE) MG tablet Take 1 tablet (325 mg total) by mouth 2 (two) times daily with a meal. 60 tablet 6  . gabapentin (NEURONTIN) 100 MG capsule TAKE 1 CAPSULE BY MOUTH AT BEDTIME OR AS DIRECTED 30 capsule 5  . hydrALAZINE (APRESOLINE) 100 MG tablet Take 0.5 tablets (50 mg total) by mouth 3 (three) times daily. 90 tablet 6  . losartan (COZAAR) 50 MG tablet Take 1 tablet (50 mg total) by mouth daily. 30 tablet 6  . pantoprazole (PROTONIX) 40 MG tablet TAKE 1 TABLET BY MOUTH EVERY DAY 30 tablet 6  . potassium chloride SA (K-DUR,KLOR-CON) 20 MEQ tablet Take 20 mEq by mouth 2 (two) times daily.    [name]  spironolactone (ALDACTONE) 25 MG tablet Take 0.5 tablets (12.5 mg total) by mouth daily. 30 tablet 6  . warfarin (COUMADIN) 2.5 MG tablet Take 1-2 tablets (2.5-5 mg total) by mouth daily. 2.5mg  daily except 5mg  on Wednesdays 60 tablet 11  . docusate sodium (COLACE) 100 MG capsule Take 1 capsule (100 mg total) by mouth 2 (two) times daily. (Patient taking differently: Take 100 mg by mouth daily. ) 60 capsule 6   No current facility-administered medications for this encounter.     Review of patient's allergies indicates no known allergies.  REVIEW OF SYSTEMS: All systems negative except as listed in HPI, PMH and  Problem list.   Vital Signs:  BP (!) 118/0 Comment: doppler map  Pulse 77   Ht 5' (1.524 m)   Wt 171 lb 9.6 oz (77.8 kg)   SpO2 100%   BMI 33.51 kg/m   Physical Exam: GENERAL: Well appearing, female, NAD, [name] present. Ambulated in the clinic without difficulty.  HEENT: normal  NECK: Supple, JVP flat,  2+ bilaterally, no bruits.  No lymphadenopathy or thyromegaly appreciated.   CARDIAC:  LVAD hum present.  LUNGS:  Clear to auscultation bilaterally.  ABDOMEN:  Soft, round, nontender, positive bowel sounds. LVAD exit site: Dressing dry and intact.  No erythema or drainage.  Stabilization device present and accurately applied. Driveline dressing changed weekly. EXTREMITIES:  Warm and dry, no cyanosis, clubbing, rash, edema  NEUROLOGIC:  Alert and oriented x 4.  Gait steady.  No aphasia.  No dysarthria.  Affect pleasant.     ASSESSMENT AND PLAN:  1) Chronic systolic HF: NICM, s/p ICD and LVAD for DT(04/2013).  - Doing great.  NYHA I. Volume status stable.  Does not take diuretics.  On 12.5 mg spiro daily.  - Continue hydralazine 50 mg tid and carvedilol 6.25 bid. Increase losartan to 50 mg daily.  - Reinforced the need and importance of daily weights, a low sodium diet, and fluid restriction (less than 2 L a day). Instructed to call the HF clinic if weight increases more than 3 lbs overnight or 5 lbs in a week.  2) Acute cholecystitis: Now s/p lap cholecystectomy.  Resolved.  3) LVAD placed for DT 04/2013: All parameters stable (reviewed personally).  - Check LDH, INR, CBC and BMET today.  - Remains on coumadin.   Not on aspirin due to intolerance. 4) PAF: Stable. Continue amiodarone to 100 mg daily. Follow TSH and LFTs.  Needs year eye exams.    5) HTN: BP elevated. Increase losartan to 50 mg daily. Considered increasing spiro but with recent low flows hold off. Continue carvedilol at 6.25 bid     6) Chronic Anticoagulation: Goal INR 2-3 Continue coumadin. Check INR  Follow up in 2  months.    Brianna Clegg  NP-C   04/02/2016

## 2016-04-03 ENCOUNTER — Ambulatory Visit (INDEPENDENT_AMBULATORY_CARE_PROVIDER_SITE_OTHER): Payer: Medicare Other | Admitting: *Deleted

## 2016-04-03 ENCOUNTER — Encounter (HOSPITAL_COMMUNITY): Payer: Self-pay | Admitting: Unknown Physician Specialty

## 2016-04-03 ENCOUNTER — Telehealth: Payer: Self-pay | Admitting: Infectious Diseases

## 2016-04-03 DIAGNOSIS — I429 Cardiomyopathy, unspecified: Secondary | ICD-10-CM | POA: Diagnosis not present

## 2016-04-03 DIAGNOSIS — I4901 Ventricular fibrillation: Secondary | ICD-10-CM

## 2016-04-03 NOTE — Telephone Encounter (Signed)
Page returned to [place]. Reports that Terianna's VAD is alarming LOW FLOW alarm x 1 this morning shortly after she got up and was getting ready in the bathroom. Reports the flows after alarms subsided to be 2.8 - 3.5 initially then 20 min after were >4.0 (which is her baseline since we dropped her speed last week). Reports PI currently is 7.8 - he was not sure what this was at the time of the alarm as he only looked at the flows. Asymptomatic preceeding, during and after alarm.   Was seen in clinic yesterday and only med change was increasing her Losartan at night. She did have some low flows on interrogation yesterday all in the morning. D/W Amy Clegg and attributing these to hypovolemia. D/C Arlyce Harman for now. Instructed him to call with further alarms should they occur and to continue to use diuretics sparingly and cautiously. Verbalized understanding of the above.   Janene Madeira, RN VAD Coordinator   Office: [phone] 24/7 Emergency VAD Pager: [phone]

## 2016-04-03 NOTE — Progress Notes (Signed)
Remote ICD transmission.   

## 2016-04-05 ENCOUNTER — Other Ambulatory Visit (HOSPITAL_COMMUNITY): Payer: Self-pay | Admitting: *Deleted

## 2016-04-05 DIAGNOSIS — Z95811 Presence of heart assist device: Secondary | ICD-10-CM

## 2016-04-05 DIAGNOSIS — Z7901 Long term (current) use of anticoagulants: Secondary | ICD-10-CM

## 2016-04-08 ENCOUNTER — Ambulatory Visit (HOSPITAL_COMMUNITY): Payer: Self-pay | Admitting: *Deleted

## 2016-04-08 ENCOUNTER — Ambulatory Visit (HOSPITAL_COMMUNITY)
Admission: RE | Admit: 2016-04-08 | Discharge: 2016-04-08 | Disposition: A | Discharge status: Home / Self Care | Payer: Medicare Other | Source: Ambulatory Visit | Attending: Internal Medicine | Admitting: Internal Medicine

## 2016-04-08 DIAGNOSIS — Z95811 Presence of heart assist device: Secondary | ICD-10-CM

## 2016-04-08 DIAGNOSIS — Z7901 Long term (current) use of anticoagulants: Secondary | ICD-10-CM | POA: Insufficient documentation

## 2016-04-08 DIAGNOSIS — Z5181 Encounter for therapeutic drug level monitoring: Secondary | ICD-10-CM | POA: Diagnosis not present

## 2016-04-08 LAB — PROTIME-INR
INR: 1.79
Prothrombin Time: 21 seconds — ABNORMAL HIGH (ref 11.4–15.2)

## 2016-04-10 ENCOUNTER — Encounter: Payer: Self-pay | Admitting: Cardiology

## 2016-04-10 NOTE — Progress Notes (Signed)
Letter  

## 2016-04-15 ENCOUNTER — Ambulatory Visit (HOSPITAL_COMMUNITY): Payer: Self-pay | Admitting: Unknown Physician Specialty

## 2016-04-15 ENCOUNTER — Ambulatory Visit (HOSPITAL_COMMUNITY)
Admission: RE | Admit: 2016-04-15 | Discharge: 2016-04-15 | Disposition: A | Discharge status: Home / Self Care | Payer: Medicare Other | Source: Ambulatory Visit | Attending: Cardiology | Admitting: Cardiology

## 2016-04-15 DIAGNOSIS — Z5181 Encounter for therapeutic drug level monitoring: Secondary | ICD-10-CM | POA: Diagnosis not present

## 2016-04-15 DIAGNOSIS — Z95811 Presence of heart assist device: Secondary | ICD-10-CM | POA: Insufficient documentation

## 2016-04-15 DIAGNOSIS — Z7901 Long term (current) use of anticoagulants: Secondary | ICD-10-CM | POA: Diagnosis not present

## 2016-04-15 LAB — PROTIME-INR
INR: 2.19
Prothrombin Time: 24.7 seconds — ABNORMAL HIGH (ref 11.4–15.2)

## 2016-04-19 LAB — CUP PACEART REMOTE DEVICE CHECK
Battery Remaining Longevity: 37 mo
Battery Remaining Percentage: 36 %
Battery Voltage: 2.9 V
Brady Statistic AP VP Percent: 1 %
Brady Statistic AP VS Percent: 4.1 %
Brady Statistic AS VP Percent: 1 %
Brady Statistic AS VS Percent: 95 %
Brady Statistic RA Percent Paced: 2.3 %
Brady Statistic RV Percent Paced: 1 %
Date Time Interrogation Session: 20170802072311
HighPow Impedance: 45 Ohm
Implantable Lead Implant Date: 20110104
Implantable Lead Implant Date: 20110104
Implantable Lead Location: 753859
Implantable Lead Location: 753860
Implantable Lead Model: 7121
Lead Channel Impedance Value: 310 Ohm
Lead Channel Impedance Value: 310 Ohm
Lead Channel Pacing Threshold Amplitude: 0.75 V
Lead Channel Pacing Threshold Amplitude: 0.75 V
Lead Channel Pacing Threshold Pulse Width: 0.5 ms
Lead Channel Pacing Threshold Pulse Width: 0.5 ms
Lead Channel Sensing Intrinsic Amplitude: 1.5 mV
Lead Channel Sensing Intrinsic Amplitude: 11.4 mV
Lead Channel Setting Pacing Amplitude: 2 V
Lead Channel Setting Pacing Amplitude: 2.5 V
Lead Channel Setting Pacing Pulse Width: 0.5 ms
Lead Channel Setting Sensing Sensitivity: 0.5 mV
Pulse Gen Serial Number: 754815

## 2016-04-29 ENCOUNTER — Ambulatory Visit (HOSPITAL_COMMUNITY)
Admission: RE | Admit: 2016-04-29 | Discharge: 2016-04-29 | Disposition: A | Discharge status: Home / Self Care | Payer: Medicare Other | Source: Ambulatory Visit | Attending: Cardiology | Admitting: Cardiology

## 2016-04-29 ENCOUNTER — Telehealth (HOSPITAL_COMMUNITY): Payer: Self-pay | Admitting: *Deleted

## 2016-04-29 DIAGNOSIS — Z5181 Encounter for therapeutic drug level monitoring: Secondary | ICD-10-CM | POA: Insufficient documentation

## 2016-04-29 DIAGNOSIS — Z7901 Long term (current) use of anticoagulants: Secondary | ICD-10-CM | POA: Diagnosis not present

## 2016-04-29 NOTE — Telephone Encounter (Signed)
Pt came in today for an INR. I drew her labs and tubed them to the lab.  The lab stated there wasn't enough blood in the tube. I called patient to see if she could come in the morning for lab work. No answer on either number listed in her chart. Left voice message on home phone for pt to call back

## 2016-04-30 ENCOUNTER — Telehealth (HOSPITAL_COMMUNITY): Payer: Self-pay | Admitting: Unknown Physician Specialty

## 2016-04-30 ENCOUNTER — Ambulatory Visit (HOSPITAL_COMMUNITY): Payer: Self-pay | Admitting: Unknown Physician Specialty

## 2016-04-30 ENCOUNTER — Ambulatory Visit (HOSPITAL_COMMUNITY)
Admission: RE | Admit: 2016-04-30 | Discharge: 2016-04-30 | Disposition: A | Discharge status: Home / Self Care | Payer: Medicare Other | Source: Ambulatory Visit | Attending: Cardiology | Admitting: Cardiology

## 2016-04-30 DIAGNOSIS — Z7901 Long term (current) use of anticoagulants: Secondary | ICD-10-CM | POA: Insufficient documentation

## 2016-04-30 DIAGNOSIS — I5022 Chronic systolic (congestive) heart failure: Secondary | ICD-10-CM

## 2016-04-30 LAB — PROTIME-INR
INR: 2.54
Prothrombin Time: 27.8 seconds — ABNORMAL HIGH (ref 11.4–15.2)

## 2016-04-30 NOTE — Telephone Encounter (Signed)
Instructed pt/caregiver that there was insufficient amount of blood in the tube that was drawn yesterday. Pt/caregiver stated that they could come today for another blood draw. Phone number that we had listed was incorrect. Changes made to the numbers we have saved in the system.

## 2016-05-13 DIAGNOSIS — Z7901 Long term (current) use of anticoagulants: Secondary | ICD-10-CM | POA: Diagnosis not present

## 2016-05-14 ENCOUNTER — Ambulatory Visit (HOSPITAL_COMMUNITY): Payer: Self-pay | Admitting: Unknown Physician Specialty

## 2016-05-14 ENCOUNTER — Ambulatory Visit (HOSPITAL_COMMUNITY)
Admission: RE | Admit: 2016-05-14 | Discharge: 2016-05-14 | Disposition: A | Discharge status: Home / Self Care | Payer: Medicare Other | Source: Ambulatory Visit | Attending: Internal Medicine | Admitting: Internal Medicine

## 2016-05-14 DIAGNOSIS — Z5181 Encounter for therapeutic drug level monitoring: Secondary | ICD-10-CM | POA: Insufficient documentation

## 2016-05-14 DIAGNOSIS — Z7901 Long term (current) use of anticoagulants: Secondary | ICD-10-CM | POA: Diagnosis not present

## 2016-05-14 LAB — PROTIME-INR
INR: 1.96
Prothrombin Time: 22.7 seconds — ABNORMAL HIGH (ref 11.4–15.2)

## 2016-05-21 ENCOUNTER — Ambulatory Visit (HOSPITAL_COMMUNITY): Payer: Self-pay | Admitting: *Deleted

## 2016-05-21 LAB — POCT INR: INR: 2.5

## 2016-05-28 ENCOUNTER — Ambulatory Visit (HOSPITAL_COMMUNITY): Payer: Self-pay | Admitting: Infectious Diseases

## 2016-05-28 LAB — POCT INR: INR: 2.6

## 2016-06-03 ENCOUNTER — Other Ambulatory Visit (HOSPITAL_COMMUNITY): Payer: Self-pay | Admitting: Unknown Physician Specialty

## 2016-06-03 ENCOUNTER — Ambulatory Visit (HOSPITAL_COMMUNITY)
Admission: RE | Admit: 2016-06-03 | Discharge: 2016-06-03 | Disposition: A | Discharge status: Home / Self Care | Payer: Medicare Other | Source: Ambulatory Visit | Attending: Internal Medicine | Admitting: Internal Medicine

## 2016-06-03 DIAGNOSIS — I50810 Right heart failure, unspecified: Secondary | ICD-10-CM | POA: Diagnosis not present

## 2016-06-03 DIAGNOSIS — I5022 Chronic systolic (congestive) heart failure: Secondary | ICD-10-CM | POA: Diagnosis not present

## 2016-06-03 DIAGNOSIS — Z79899 Other long term (current) drug therapy: Secondary | ICD-10-CM | POA: Insufficient documentation

## 2016-06-03 DIAGNOSIS — Z8674 Personal history of sudden cardiac arrest: Secondary | ICD-10-CM | POA: Diagnosis not present

## 2016-06-03 DIAGNOSIS — Z7901 Long term (current) use of anticoagulants: Secondary | ICD-10-CM

## 2016-06-03 DIAGNOSIS — I480 Paroxysmal atrial fibrillation: Secondary | ICD-10-CM | POA: Insufficient documentation

## 2016-06-03 DIAGNOSIS — Z95811 Presence of heart assist device: Secondary | ICD-10-CM | POA: Diagnosis not present

## 2016-06-03 DIAGNOSIS — I110 Hypertensive heart disease with heart failure: Secondary | ICD-10-CM | POA: Diagnosis not present

## 2016-06-03 DIAGNOSIS — Z9049 Acquired absence of other specified parts of digestive tract: Secondary | ICD-10-CM | POA: Insufficient documentation

## 2016-06-03 DIAGNOSIS — M8580 Other specified disorders of bone density and structure, unspecified site: Secondary | ICD-10-CM | POA: Diagnosis not present

## 2016-06-03 DIAGNOSIS — I428 Other cardiomyopathies: Secondary | ICD-10-CM | POA: Insufficient documentation

## 2016-06-03 LAB — CBC
HCT: 36.8 % (ref 36.0–46.0)
Hemoglobin: 11.7 g/dL — ABNORMAL LOW (ref 12.0–15.0)
MCH: 30.2 pg (ref 26.0–34.0)
MCHC: 31.8 g/dL (ref 30.0–36.0)
MCV: 95.1 fL (ref 78.0–100.0)
Platelets: 211 10*3/uL (ref 150–400)
RBC: 3.87 MIL/uL (ref 3.87–5.11)
RDW: 15.6 % — ABNORMAL HIGH (ref 11.5–15.5)
WBC: 5.2 10*3/uL (ref 4.0–10.5)

## 2016-06-03 LAB — BASIC METABOLIC PANEL
Anion gap: 6 (ref 5–15)
BUN: 11 mg/dL (ref 6–20)
CO2: 24 mmol/L (ref 22–32)
Calcium: 9.1 mg/dL (ref 8.9–10.3)
Chloride: 108 mmol/L (ref 101–111)
Creatinine, Ser: 0.87 mg/dL (ref 0.44–1.00)
GFR calc Af Amer: >60 mL/min (ref >60)
GFR calc non Af Amer: >60 mL/min (ref >60)
Glucose, Bld: 84 mg/dL (ref 65–99)
Potassium: 4.2 mmol/L (ref 3.5–5.1)
Sodium: 138 mmol/L (ref 135–145)

## 2016-06-03 LAB — LACTATE DEHYDROGENASE: LDH: 214 U/L — ABNORMAL HIGH (ref 98–192)

## 2016-06-03 MED ORDER — LOSARTAN POTASSIUM 50 MG PO TABS: 100.0000 mg | ORAL_TABLET | Freq: Every day | ORAL | 6 refills | Status: DC

## 2016-06-03 NOTE — Progress Notes (Signed)
Symptom  Yes  No  Details   Angina        x Activity:   Claudication        x How far:   Syncope        x When:   Stroke        x   Orthopnea        x How many pillows:  1  PND        x How often:  CPAP     N/A How many hrs:   Pedal edema        x   Abd fullness             x   N&V        x Good appetite; early satiety   Diaphoresis             x When:  Nightly (normal)  Bleeding x       Pt states that she has bright red blood rarely from hemorrhoids   Urine color            x  yellow  SOB              x Activity: inclines  Palpitations         x When:  ICD shock         x   Hospitlizaitons             x When/where/why:   ED visit         x When/where/why:   Other MD        x When/who/why:  Activity        x Improved  Fluid      2 liters/day  Diet    Low salt   Vital signs: HR:  73 Doppler modified systolic: [id number] Auto cuff BP:  127/89 (102) O2 Sat:  98 Wt:  178.8 lbs Last wt: 171.6  lbs Ht:  5'  LVAD interrogation reveals (reviewed with Dr Aundra Dubin):  Speed:   9000 Flow:   4.1 Power:   4.6 PI:  7.9 Alarms:  none Events:   2 - 8 PI events Fixed speed:   9000 Low speed limit:  8400 Primary Controller:  Replace back up battery in 24  months Back up controller:   Replace back up battery in 24 months   LVAD exit site:  Well healed and incorporated. The velour is fully implanted at exit site. Sorbaview dressing dry and intact. Stabiliization device present and accurately applied. Driveline dressing is being changed  weekly per sterile technique using Sorbaview dressing with biopatch on exit site. Pt denies fever or chills. Pt/caregiver provided with adequate dressing supplies for home.  I reviewed the LVAD parameters from today, and compared the results to the patient's prior recorded data. No programming changes were made. The LVAD is functioning within specified parameters.  LVAD interrogation was positive for low flow  on 9/15, 9/16 and several low flows on 05/25/16. Pt  states that the only alarm she had was on 9/23. Pt states that she felt fine that day and was sitting in the recliner when her VAD alarmed. Pt denies any dizziness. Pt states that she increased her fluids and that she did not have any more alarms that day. No other significant power changes or alarms; a few PI events/speed drops present which is consistent with the patient's history.  Pt/caregiver deny any alarms or VAD equipment issues. VAD equipment  check completed and is in good working order. Back-up equipment present.   Patient Instructions:  1. Increase Losartan to 100 mg daily. 2. Check INR tomorrow using home machine.  3. Return to [place] clinic in 2 months.       Tanda Rockers RN, VAD coordinator

## 2016-06-03 NOTE — Progress Notes (Signed)
ADVANCED HF CLINIC NOTE  Patient ID: Brianna Hanson, female   DOB: [birth year], 81 y.o.   MRN: [id number] Primary Heart Failure: Dr Haroldine Laws Cardiac Surgeon: Dr Prescott Gum  HPI: [name] is a 81 year old female with a PMH of HF due to severe NICM, chronic systolic HF,  PAF,  plasma cell disorder (Likely IgA MGUS) - followed by Dr. [name] (follwoed q 6 months). Underwent implantation of the HeartMate II LVAD on 04/06/13 for DT. She is not on aspirin due to dizziness.   Admitted to [facility] from 12/4 to 08/18/15 for acute cholecystitis. Underwent placement of a cholecystotomy drain by IR.  Seen in IR drain clinic and cholangiogram showed patent ducts with residual gallstones.  She was admitted in 2/17 for laparoscopic cholecystectomy.  This was successful.   She returns for LVAD follow up. Doing very well. Doing all ADLs and going out without any SOB or any limitations. Denies CP, edema, orthopnea, PND. No bleeding or neuro symptoms.   Had LVAD alarm (LOW FLOW on 9/23).  Felt fine. She drank some fluid and it went away.,   Denies driveline trauma, erythema or drainage.  Denies ICD shocks.   Reports taking Coumadin as prescribed.  Denies bright red blood per rectum or melena, no dark urine or hematuria.    Labs (3/17): hgb 11.4, INR 2.36  LVAD interrogation reveals  Speed:   9000 Flow:   4.1 Power:   4.6 PI:  7.9 Alarms:  none Events:   2 - 8 PI events Fixed speed:   9000 Low speed limit:  8400 Primary Controller:  Replace back up battery in 24  months Back up controller:   Replace back up battery in 24 months  Past Medical History:  Diagnosis Date  . AICD (automatic cardioverter/defibrillator) present   . Anemia   . Atrial fibrillation or flutter   . Cardiac arrest - ventricular fibrillation 12/10   with successful resucitation, S/p ICD  . CHF (congestive heart failure) ([place])   . Diverticula of colon 2011  . HTN (hypertension)    moderate  . ICD (implantable cardiac  defibrillator) in place    she has received appropriate therapy for VF  . Internal and external hemorrhoids without complication [id number]  . Nonischemic cardiomyopathy ([place])    followed by Dr April Holding at [facility]  . Osteopenia   . Plasma cell disorder 03/20/2012  . Plasma cell disorder 03/20/2012  . Sessile colonic polyp 2011   Dr Benson [place]    Current Outpatient Prescriptions  Medication Sig Dispense Refill  . amiodarone (PACERONE) 200 MG tablet Take 0.5 tablets (100 mg total) by mouth daily. 90 tablet 3  . carvedilol (COREG) 6.25 MG tablet Take 1 tablet (6.25 mg total) by mouth 2 (two) times daily with a meal. 60 tablet 5  . Cholecalciferol (VITAMIN D3) 1000 UNITS CAPS Take 1 tablet by mouth daily.      [name] docusate sodium (COLACE) 100 MG capsule Take 1 capsule (100 mg total) by mouth 2 (two) times daily. (Patient taking differently: Take 100 mg by mouth daily. ) 60 capsule 6  . ferrous sulfate 325 (65 FE) MG tablet Take 1 tablet (325 mg total) by mouth 2 (two) times daily with a meal. 60 tablet 6  . gabapentin (NEURONTIN) 100 MG capsule TAKE 1 CAPSULE BY MOUTH AT BEDTIME OR AS DIRECTED 30 capsule 5  . hydrALAZINE (APRESOLINE) 100 MG tablet Take 0.5 tablets (50 mg total) by mouth 3 (three) times daily.  90 tablet 6  . losartan (COZAAR) 50 MG tablet Take 2 tablets (100 mg total) by mouth daily. 30 tablet 6  . pantoprazole (PROTONIX) 40 MG tablet TAKE 1 TABLET BY MOUTH EVERY DAY 30 tablet 6  . potassium chloride SA (K-DUR,KLOR-CON) 20 MEQ tablet Take 20 mEq by mouth 2 (two) times daily.    [name] warfarin (COUMADIN) 2.5 MG tablet Take 1-2 tablets (2.5-5 mg total) by mouth daily. 2.5mg  daily except 5mg  on Wednesdays 60 tablet 11   No current facility-administered medications for this encounter.     Review of patient's allergies indicates no known allergies.  REVIEW OF SYSTEMS: All systems negative except as listed in HPI, PMH and Problem list.   Vital Signs:  BP 127/89 Comment: map-102  Pulse 73   Ht 5'  (1.524 m)   Wt 178 lb 12.8 oz (81.1 kg)   BMI 34.92 kg/m    Vital signs: HR:  73 Doppler modified systolic: [id number] Auto cuff BP:  127/89 (102) O2 Sat:  98 Wt:  178.8 lbs Last wt: 171.6  lbs Ht:  5'  Physical Exam: GENERAL: Well appearing, female, NAD, [name] present. Ambulated in the clinic without difficulty.  HEENT: normal  NECK: Supple, JVP flat,  2+ bilaterally, no bruits.  No lymphadenopathy or thyromegaly appreciated.   CARDIAC:  LVAD hum present.  LUNGS:  Clear to auscultation bilaterally.  ABDOMEN:  Soft, round, nontender, positive bowel sounds. LVAD exit site: Dressing dry and intact.  No erythema or drainage.  Stabilization device present and accurately applied. Driveline dressing changed weekly. EXTREMITIES:  Warm and dry, no cyanosis, clubbing, rash, edema  NEUROLOGIC:  Alert and oriented x 4.  Gait steady.  No aphasia.  No dysarthria.  Affect pleasant.     ASSESSMENT AND PLAN:  1) Chronic systolic HF: NICM, s/p ICD and LVAD for DT(04/2013).  - Doing great.  NYHA I. Volume status stable.  Does not take diuretics. Off spiro due to volume depletion  - Continue hydralazine 50 mg tid and carvedilol 6.25 bid. Increase losartan to 100 mg daily.  - Several low flow alarms on VAD. Suspect suction events in setting of LV remodeling and mild volume depletion. Diuretics stopped. Check labs.  - Reinforced the need and importance of daily weights, a low sodium diet, and fluid restriction (less than 2 L a day). Instructed to call the HF clinic if weight increases more than 3 lbs overnight or 5 lbs in a week.  2) Acute cholecystitis: Now s/p lap cholecystectomy.  Resolved.  3) LVAD placed for DT 04/2013: All parameters stable (reviewed personally).  - Check LDH, INR, CBC and BMET today.  - Remains on coumadin.   Not on aspirin due to intolerance. 4) PAF: Stable. Continue amiodarone to 100 mg daily. Follow TSH and LFTs.  Needs year eye exams.    5) HTN: BP elevated. Increase losartan to  100 mg daily. Continue hydralazine and carvedilol.   6) Chronic Anticoagulation: Goal INR 2-3 Continue coumadin. Check INR  Glori Bickers MD   06/03/2016

## 2016-06-04 ENCOUNTER — Ambulatory Visit (HOSPITAL_COMMUNITY): Payer: Self-pay | Admitting: Unknown Physician Specialty

## 2016-06-04 DIAGNOSIS — Z7901 Long term (current) use of anticoagulants: Secondary | ICD-10-CM | POA: Diagnosis not present

## 2016-06-04 LAB — POCT INR: INR: 2.7

## 2016-06-11 ENCOUNTER — Ambulatory Visit (HOSPITAL_COMMUNITY): Payer: Self-pay | Admitting: Infectious Diseases

## 2016-06-11 LAB — POCT INR: INR: 2.4

## 2016-06-18 ENCOUNTER — Ambulatory Visit (HOSPITAL_COMMUNITY): Payer: Self-pay | Admitting: Pharmacist

## 2016-06-18 LAB — POCT INR: INR: 2.3

## 2016-06-25 ENCOUNTER — Ambulatory Visit (HOSPITAL_COMMUNITY): Payer: Self-pay | Admitting: Pharmacist

## 2016-06-25 ENCOUNTER — Other Ambulatory Visit (HOSPITAL_COMMUNITY): Payer: Self-pay | Admitting: *Deleted

## 2016-06-25 DIAGNOSIS — Z95811 Presence of heart assist device: Secondary | ICD-10-CM

## 2016-06-25 DIAGNOSIS — I50810 Right heart failure, unspecified: Secondary | ICD-10-CM

## 2016-06-25 DIAGNOSIS — I5022 Chronic systolic (congestive) heart failure: Secondary | ICD-10-CM

## 2016-06-25 LAB — POCT INR: INR: 2.6

## 2016-06-25 MED ORDER — LOSARTAN POTASSIUM 50 MG PO TABS: 100.0000 mg | ORAL_TABLET | Freq: Every day | ORAL | 6 refills | Status: DC

## 2016-06-28 DIAGNOSIS — Z23 Encounter for immunization: Secondary | ICD-10-CM | POA: Diagnosis not present

## 2016-07-01 ENCOUNTER — Telehealth (HOSPITAL_COMMUNITY): Payer: Self-pay | Admitting: Infectious Diseases

## 2016-07-01 MED ORDER — TRAMADOL HCL 50 MG PO TABS: 50.0000 mg | ORAL_TABLET | Freq: Two times a day (BID) | ORAL | 3 refills | Status: DC | PRN

## 2016-07-01 NOTE — Telephone Encounter (Signed)
Called requesting Tramadol refill. OK per [name].

## 2016-07-02 ENCOUNTER — Ambulatory Visit (HOSPITAL_COMMUNITY): Payer: Self-pay | Admitting: Pharmacist

## 2016-07-02 DIAGNOSIS — Z7901 Long term (current) use of anticoagulants: Secondary | ICD-10-CM | POA: Diagnosis not present

## 2016-07-02 LAB — POCT INR: INR: 2.9

## 2016-07-02 NOTE — Addendum Note (Signed)
Addended by: Adora Fridge on: 07/02/2016 11:25 AM   Modules accepted: Orders

## 2016-07-03 ENCOUNTER — Ambulatory Visit (INDEPENDENT_AMBULATORY_CARE_PROVIDER_SITE_OTHER): Payer: Medicare Other | Admitting: *Deleted

## 2016-07-03 DIAGNOSIS — I429 Cardiomyopathy, unspecified: Secondary | ICD-10-CM

## 2016-07-03 NOTE — Progress Notes (Signed)
Remote ICD transmission.   

## 2016-07-09 ENCOUNTER — Ambulatory Visit (HOSPITAL_COMMUNITY): Payer: Self-pay | Admitting: Pharmacist

## 2016-07-09 LAB — POCT INR: INR: 2.2

## 2016-07-10 ENCOUNTER — Encounter: Payer: Self-pay | Admitting: Cardiology

## 2016-07-16 ENCOUNTER — Ambulatory Visit (HOSPITAL_COMMUNITY): Payer: Self-pay | Admitting: Pharmacist

## 2016-07-16 LAB — POCT INR: INR: 2.4

## 2016-07-24 ENCOUNTER — Ambulatory Visit (HOSPITAL_COMMUNITY): Payer: Self-pay | Admitting: Pharmacist

## 2016-07-24 LAB — POCT INR: INR: 2.4

## 2016-07-30 ENCOUNTER — Ambulatory Visit (HOSPITAL_COMMUNITY): Payer: Self-pay | Admitting: Pharmacist

## 2016-07-30 DIAGNOSIS — Z7901 Long term (current) use of anticoagulants: Secondary | ICD-10-CM | POA: Diagnosis not present

## 2016-07-30 LAB — POCT INR: INR: 2.6

## 2016-08-06 ENCOUNTER — Ambulatory Visit (HOSPITAL_COMMUNITY)
Admission: RE | Admit: 2016-08-06 | Discharge: 2016-08-06 | Disposition: A | Discharge status: Home / Self Care | Payer: Medicare Other | Source: Ambulatory Visit | Attending: Internal Medicine | Admitting: Internal Medicine

## 2016-08-06 ENCOUNTER — Ambulatory Visit (HOSPITAL_COMMUNITY): Payer: Self-pay | Admitting: Pharmacist

## 2016-08-06 VITALS — BP 136/0 | HR 70 | Resp 14 | Wt 181.8 lb

## 2016-08-06 DIAGNOSIS — I5023 Acute on chronic systolic (congestive) heart failure: Secondary | ICD-10-CM | POA: Diagnosis not present

## 2016-08-06 DIAGNOSIS — I480 Paroxysmal atrial fibrillation: Secondary | ICD-10-CM

## 2016-08-06 DIAGNOSIS — Z95811 Presence of heart assist device: Secondary | ICD-10-CM | POA: Insufficient documentation

## 2016-08-06 DIAGNOSIS — I5022 Chronic systolic (congestive) heart failure: Secondary | ICD-10-CM

## 2016-08-06 LAB — T4, FREE: Free T4: 1.17 ng/dL — ABNORMAL HIGH (ref 0.61–1.12)

## 2016-08-06 LAB — CBC
HCT: 35.4 % — ABNORMAL LOW (ref 36.0–46.0)
Hemoglobin: 11.2 g/dL — ABNORMAL LOW (ref 12.0–15.0)
MCH: 29.6 pg (ref 26.0–34.0)
MCHC: 31.6 g/dL (ref 30.0–36.0)
MCV: 93.7 fL (ref 78.0–100.0)
Platelets: 201 10*3/uL (ref 150–400)
RBC: 3.78 MIL/uL — ABNORMAL LOW (ref 3.87–5.11)
RDW: 15.9 % — ABNORMAL HIGH (ref 11.5–15.5)
WBC: 5.4 10*3/uL (ref 4.0–10.5)

## 2016-08-06 LAB — COMPREHENSIVE METABOLIC PANEL
ALT: 15 U/L (ref 14–54)
AST: 21 U/L (ref 15–41)
Albumin: 3.2 g/dL — ABNORMAL LOW (ref 3.5–5.0)
Alkaline Phosphatase: 91 U/L (ref 38–126)
Anion gap: 3 — ABNORMAL LOW (ref 5–15)
BUN: 11 mg/dL (ref 6–20)
CO2: 26 mmol/L (ref 22–32)
Calcium: 9 mg/dL (ref 8.9–10.3)
Chloride: 109 mmol/L (ref 101–111)
Creatinine, Ser: 0.85 mg/dL (ref 0.44–1.00)
GFR calc Af Amer: >60 mL/min (ref >60)
GFR calc non Af Amer: >60 mL/min (ref >60)
Glucose, Bld: 88 mg/dL (ref 65–99)
Potassium: 4.4 mmol/L (ref 3.5–5.1)
Sodium: 138 mmol/L (ref 135–145)
Total Bilirubin: 0.6 mg/dL (ref 0.3–1.2)
Total Protein: 7.8 g/dL (ref 6.5–8.1)

## 2016-08-06 LAB — TSH: TSH: 4.678 u[IU]/mL — ABNORMAL HIGH (ref 0.350–4.500)

## 2016-08-06 LAB — BRAIN NATRIURETIC PEPTIDE: B Natriuretic Peptide: 474.2 pg/mL — ABNORMAL HIGH (ref 0.0–100.0)

## 2016-08-06 LAB — POCT INR: INR: 2.6

## 2016-08-06 LAB — LACTATE DEHYDROGENASE: LDH: 231 U/L — ABNORMAL HIGH (ref 98–192)

## 2016-08-06 NOTE — Patient Instructions (Signed)
Follow up Thursday for a blood pressure check, make sure to take medications.  2 month check up with clinic   Will call for lab work results

## 2016-08-06 NOTE — Progress Notes (Signed)
Patient presents for 2 month follow up in [facility] today. Reports no problems with VAD equipment or concerns with drive line.  Vital Signs:  Doppler Pressure: 136  Automatc BP: 135/88 HR:  70 SPO2: 98 %  Weight: 181.8 lb w/ eqt Last weight: 178.8 lb   VAD Indication: Destination Therapy  VAD interrogation & Equipment Management: Speed: 9000 Flow: 3.7 Power: 4.4 w    PI: 7.6  Alarms: Several low flow alarms especially on the Saturday after Thanksgiving, with 21 low flow alarms. Pt said she was out shopping and had a maybe two alarms that day. She also states that she "hadn't drank much water that day."   Events: 1-2 PI events daily  Fixed speed: 9000 Low speed limit: 8400  Primary Controller:  Replace back up battery in 22 months. Back up controller:   Replace back up battery in 22 months.  Annual Equipment Maintenance on UBC/PM was performed on 04/02/2016.   I reviewed the LVAD parameters from today and compared the results to the patient's prior recorded data. LVAD interrogation was NEGATIVE for significant power changes, NEGATIVE for clinical alarms and STABLE for PI events/speed drops. No programming changes were made and pump is functioning within specified parameters. Pt is performing daily controller and system monitor self tests along with completing weekly and monthly maintenance for LVAD equipment.  LVAD equipment check completed and is in good working order. Back-up equipment present. Charged back up battery and performed self-test on equipment.   Exit Site Care: Drive line is being maintained weekly by husband. Drive line exit site well healed and incorporated. The velour is fully implanted at exit site. Dressing dry and intact. No erythema or drainage. Stabilization device present and accurately applied. Pt denies fever or chills. Gave patient weekly dressing kits.   Significant Events on VAD Support:  None  Device: St Jude  Therapies: On  Last  check: Corvue checked today in clinic  BP & Labs:  MAP 136 - Doppler is reflecting modified systolic  Hgb 123XX123 - No S/S of bleeding. Specifically denies melena/BRBPR or nosebleeds.  LDH stable at 231 with established baseline of 212- 312. Denies tea-colored urine. No power elevations noted on interrogation.   Today pt had not taken medications before coming into clinic. Modified systolic pressure was elevated. We will have her come back Thursday for a blood pressure check.    Philemon Kingdom, RN Janene Madeira, RN VAD Coordinator   Office: [phone] 24/7 Emergency VAD Pager: [phone]

## 2016-08-07 ENCOUNTER — Telehealth (HOSPITAL_COMMUNITY): Payer: Self-pay | Admitting: Infectious Diseases

## 2016-08-07 NOTE — Telephone Encounter (Signed)
Called to report ongoing audible low flow alarms. Current VAD parameters with Flows ~ 3 - 3.2 lpm, PI 7.5. She seen in VAD clinic yesterday with many low flow alarms recorded on interrogation. Advised to drink extra fluids today and try to drink extra before bed as her alarms frequently happen in the mornings after waking dehydrated.   Of note her MAP was elevated > 100 and her LVIDd on 02/2016 echo was 3.63 cm on 9200 >> she was decreased to 9000 rpm at this visit.   Brianna Madeira, RN VAD Coordinator   Office: [phone] 24/7 Emergency VAD Pager: [phone]

## 2016-08-08 ENCOUNTER — Other Ambulatory Visit (HOSPITAL_COMMUNITY): Payer: Self-pay | Admitting: *Deleted

## 2016-08-08 ENCOUNTER — Ambulatory Visit (HOSPITAL_COMMUNITY)
Admission: RE | Admit: 2016-08-08 | Discharge: 2016-08-08 | Disposition: A | Discharge status: Home / Self Care | Payer: Medicare Other | Source: Ambulatory Visit | Attending: Adult Health | Admitting: Adult Health

## 2016-08-08 ENCOUNTER — Ambulatory Visit (HOSPITAL_COMMUNITY)
Admission: RE | Admit: 2016-08-08 | Discharge: 2016-08-08 | Disposition: A | Discharge status: Home / Self Care | Payer: Medicare Other | Source: Ambulatory Visit | Attending: Internal Medicine | Admitting: Internal Medicine

## 2016-08-08 VITALS — BP 117/85 | HR 74 | Resp 18 | Wt 180.0 lb

## 2016-08-08 DIAGNOSIS — Z95811 Presence of heart assist device: Secondary | ICD-10-CM | POA: Diagnosis not present

## 2016-08-08 DIAGNOSIS — J9811 Atelectasis: Secondary | ICD-10-CM | POA: Insufficient documentation

## 2016-08-08 DIAGNOSIS — Z951 Presence of aortocoronary bypass graft: Secondary | ICD-10-CM | POA: Diagnosis not present

## 2016-08-08 DIAGNOSIS — I517 Cardiomegaly: Secondary | ICD-10-CM | POA: Insufficient documentation

## 2016-08-08 DIAGNOSIS — I509 Heart failure, unspecified: Secondary | ICD-10-CM

## 2016-08-08 DIAGNOSIS — Z950 Presence of cardiac pacemaker: Secondary | ICD-10-CM | POA: Diagnosis not present

## 2016-08-08 MED ORDER — HYDRALAZINE HCL 50 MG PO TABS: 75.0000 mg | ORAL_TABLET | Freq: Three times a day (TID) | ORAL | 3 refills | Status: DC

## 2016-08-08 NOTE — Patient Instructions (Addendum)
Chest xray today to verify LVAD placement.  Will call to confirm time for ramp echo.  INCREASE Hydralazine to 75 mg three times daily (one tab every 8 hours). New 50 mg tablets have been sent in--Take 1.5 tabs three times daily (one tab every 8 hours).  Follow up 10/08/2016 at 9:00 am as scheduled.  Do the following things EVERYDAY: 1) Weigh yourself in the morning before breakfast. Write it down and keep it in a log. 2) Take your medicines as prescribed 3) Eat low salt foods-Limit salt (sodium) to 2000 mg per day.  4) Stay as active as you can everyday 5) Limit all fluids for the day to less than 2 liters

## 2016-08-09 ENCOUNTER — Other Ambulatory Visit (HOSPITAL_COMMUNITY): Payer: Self-pay | Admitting: Internal Medicine

## 2016-08-09 ENCOUNTER — Ambulatory Visit (HOSPITAL_COMMUNITY)
Admission: RE | Admit: 2016-08-09 | Discharge: 2016-08-09 | Disposition: A | Discharge status: Home / Self Care | Payer: Medicare Other | Source: Ambulatory Visit | Attending: Internal Medicine | Admitting: Internal Medicine

## 2016-08-09 DIAGNOSIS — Z95811 Presence of heart assist device: Secondary | ICD-10-CM | POA: Diagnosis not present

## 2016-08-09 DIAGNOSIS — I509 Heart failure, unspecified: Secondary | ICD-10-CM

## 2016-08-09 NOTE — Progress Notes (Signed)
  Echocardiogram 2D Echocardiogram limited has been performed.  Brianna Hanson 08/09/2016, 2:28 PM

## 2016-08-09 NOTE — Progress Notes (Signed)
RAMP ECHO  Speed 8800 Flow 3.5  PI 7.4 Power 4.2 LVIDD 5.1 AI trivial AOV 4/5 MR trivial TR  Trivial Septum slight bow to right  RV Mild Speed 9000  Flow 3.5 PI 7.8 Power 4.6 LVIDD 4.8 AI mild AOV 0/5 MR trivial TR trivial Septum midline  RV mild Speed 9200 Flow 3.6 PI 7.4 Power 4.5 LVIDD 4.5  AI mild AOV  0/5 MR trivial TR trivial  Septum Midline  RV mild HK      Ramp ECHO performed at bedside. Patient's set speed 8800 and back-up speed 8200  Dr Haroldine Laws present for Ramp ECHO  Amy Clegg NP-C  1:14 PM  I was present for and supervised entire study.  [name], Daniel,MD 10:21 PM

## 2016-08-13 LAB — CUP PACEART REMOTE DEVICE CHECK
Battery Remaining Longevity: 36 mo
Battery Remaining Percentage: 35 %
Battery Voltage: 2.89 V
Brady Statistic AP VP Percent: 1 %
Brady Statistic AP VS Percent: 7.1 %
Brady Statistic AS VP Percent: 1 %
Brady Statistic AS VS Percent: 90 %
Brady Statistic RA Percent Paced: 3.8 %
Brady Statistic RV Percent Paced: 1 %
Date Time Interrogation Session: 20171101095357
HighPow Impedance: 47 Ohm
Implantable Lead Implant Date: 20110104
Implantable Lead Implant Date: 20110104
Implantable Lead Location: 753859
Implantable Lead Location: 753860
Implantable Lead Model: 7121
Implantable Pulse Generator Implant Date: 20110104
Lead Channel Impedance Value: 310 Ohm
Lead Channel Impedance Value: 310 Ohm
Lead Channel Pacing Threshold Amplitude: 0.75 V
Lead Channel Pacing Threshold Amplitude: 0.75 V
Lead Channel Pacing Threshold Pulse Width: 0.5 ms
Lead Channel Pacing Threshold Pulse Width: 0.5 ms
Lead Channel Sensing Intrinsic Amplitude: 1 mV
Lead Channel Sensing Intrinsic Amplitude: 11.4 mV
Lead Channel Setting Pacing Amplitude: 2 V
Lead Channel Setting Pacing Amplitude: 2.5 V
Lead Channel Setting Pacing Pulse Width: 0.5 ms
Lead Channel Setting Sensing Sensitivity: 0.5 mV
Pulse Gen Serial Number: 754815

## 2016-08-14 LAB — POCT INR: INR: 2.8

## 2016-08-15 ENCOUNTER — Ambulatory Visit (HOSPITAL_COMMUNITY): Payer: Self-pay | Admitting: Pharmacist

## 2016-08-17 NOTE — Progress Notes (Signed)
ADVANCED HF CLINIC NOTE  Patient ID: Brianna Hanson, female   DOB: September 17, 1934, 81 y.o.   MRN: [id number] Primary Heart Failure: Dr Haroldine Laws Cardiac Surgeon: Dr Prescott Gum  HPI: Brianna Hanson is a 81 year old female with a PMH of HF due to severe NICM, chronic systolic HF,  PAF,  plasma cell disorder (Likely IgA MGUS) - followed by Dr. [name] (follwoed q 6 months). Underwent implantation of the HeartMate II LVAD on 04/06/13 for DT. She is not on aspirin due to dizziness.   Admitted to [facility] from 12/4 to 08/18/15 for acute cholecystitis. Underwent placement of a cholecystotomy drain by IR.  Seen in IR drain clinic and cholangiogram showed patent ducts with residual gallstones.  She was admitted in 2/17 for laparoscopic cholecystectomy.  This was successful.   She returns for LVAD follow up. Doing very well. Doing all ADLs and going out without any SOB or any limitations. However notes multiple low flow alarms on VAD. Denies dizziness, palpitations or evidence of GI bleeding. Not taking diuretics  Denies driveline trauma, erythema or drainage.  Denies ICD shocks.   Reports taking Coumadin as prescribed.  Denies bright red blood per rectum or melena, no dark urine or hematuria.    Labs (3/17): hgb 11.4, INR 2.36   VAD Indication: Destination Therapy  VAD interrogation & Equipment Management: Speed: 9000 Flow: 3.7 Power: 4.4 w PI: 7.6  Alarms: Several low flow alarms especially on the Saturday after Thanksgiving, with 21 low flow alarms. Pt said she was out shopping and had a maybe two alarms that day. She also states that she "hadn't drank much water that day."   Events: 1-2 PI events daily  Fixed speed: 9000 Low speed limit: 8400  Primary Controller: Replace back up battery in 9months. Back up controller: Replace back up battery in 23months.   Past Medical History:  Diagnosis Date  . AICD (automatic cardioverter/defibrillator) present   . Anemia   . Atrial fibrillation  or flutter   . Cardiac arrest - ventricular fibrillation 12/10   with successful resucitation, S/p ICD  . CHF (congestive heart failure) ([place])   . Diverticula of colon 2011  . HTN (hypertension)    moderate  . ICD (implantable cardiac defibrillator) in place    she has received appropriate therapy for VF  . Internal and external hemorrhoids without complication [id number]  . Nonischemic cardiomyopathy ([place])    followed by Dr April Holding at [facility]  . Osteopenia   . Plasma cell disorder 03/20/2012  . Plasma cell disorder 03/20/2012  . Sessile colonic polyp 2011   Dr Benson [place]    Current Outpatient Prescriptions  Medication Sig Dispense Refill  . amiodarone (PACERONE) 200 MG tablet Take 0.5 tablets (100 mg total) by mouth daily. 90 tablet 3  . carvedilol (COREG) 6.25 MG tablet Take 1 tablet (6.25 mg total) by mouth 2 (two) times daily with a meal. 60 tablet 5  . Cholecalciferol (VITAMIN D3) 1000 UNITS CAPS Take 1 tablet by mouth daily.      [name] docusate sodium (COLACE) 100 MG capsule Take 1 capsule (100 mg total) by mouth 2 (two) times daily. (Patient taking differently: Take 100 mg by mouth daily. ) 60 capsule 6  . ferrous sulfate 325 (65 FE) MG tablet Take 1 tablet (325 mg total) by mouth 2 (two) times daily with a meal. 60 tablet 6  . gabapentin (NEURONTIN) 100 MG capsule TAKE 1 CAPSULE BY MOUTH AT BEDTIME OR AS DIRECTED 30  capsule 5  . losartan (COZAAR) 50 MG tablet Take 2 tablets (100 mg total) by mouth daily. 30 tablet 6  . pantoprazole (PROTONIX) 40 MG tablet TAKE 1 TABLET BY MOUTH EVERY DAY 30 tablet 6  . potassium chloride SA (K-DUR,KLOR-CON) 20 MEQ tablet Take 20 mEq by mouth 2 (two) times daily.    . traMADol (ULTRAM) 50 MG tablet Take 1 tablet (50 mg total) by mouth every 12 (twelve) hours as needed for moderate pain. 30 tablet 3  . warfarin (COUMADIN) 2.5 MG tablet Take 5 mg by mouth daily.    . hydrALAZINE (APRESOLINE) 50 MG tablet Take 1.5 tablets (75 mg total) by mouth 3 (three) times  daily. 270 tablet 3   No current facility-administered medications for this encounter.     Patient has no known allergies.  REVIEW OF SYSTEMS: All systems negative except as listed in HPI, PMH and Problem list.   Vital Signs:  BP (!) 136/0   Pulse 70   Resp 14   Wt 181 lb 12.8 oz (82.5 kg)   SpO2 98%   BMI 35.51 kg/m    Vital Signs:  Doppler Pressure:136  Automatc BP: 135/88 HR: 70 SPO2: 98 %  Weight: 181.8 lb w/ eqt Last weight: 178.8 lb    Physical Exam: GENERAL: Well appearing, female, NAD, [name] present. Ambulated in the clinic without difficulty.  HEENT: normal  NECK: Supple, JVP flat,  2+ bilaterally, no bruits.  No lymphadenopathy or thyromegaly appreciated.   CARDIAC:  LVAD hum present.  LUNGS:  Clear to auscultation bilaterally.  ABDOMEN:  Soft, round, nontender, positive bowel sounds. LVAD exit site: Dressing dry and intact.  No erythema or drainage.  Stabilization device present and accurately applied. Driveline dressing changed weekly. EXTREMITIES:  Warm and dry, no cyanosis, clubbing, rash, edema  NEUROLOGIC:  Alert and oriented x 4.  Gait steady.  No aphasia.  No dysarthria.  Affect pleasant.     ASSESSMENT AND PLAN:  1) Chronic systolic HF: NICM, s/p ICD and LVAD for DT(04/2013).  - Doing great.  NYHA I. Volume status stable.  Does not take diuretics. Off spiro due to volume depletion   - Multiple low flow alarms on VAD continue. Suspect suction events in setting of LV remodeling and mild volume depletion. Diuretics stopped previously. No evidence GI bleeding or arrhythmias. Encouraged adequate fluid intake. Will check ramp echo.  2) H/o cholecystitis: Now s/p lap cholecystectomy.  Resolved.  3) LVAD placed for DT 04/2013: Continues with low flow alarms on VAD. See above - Check LDH, INR, CBC and BMET today.  - Remains on coumadin.   Not on aspirin due to intolerance. 4) PAF: Stable. Continue amiodarone to 100 mg daily. Follow TSH and LFTs.  Needs  year eye exams.    5) HTN: BP elevated but Doppler likely modified systolic. Has not taken meds this am. Will do repeat BP check when comes for ramp echo. Continue losartan, hydralazine and carvedilol.   6) Chronic Anticoagulation: Goal INR 2-3 Continue coumadin. Check INR  Glori Bickers MD   08/17/2016

## 2016-08-19 ENCOUNTER — Other Ambulatory Visit: Payer: Self-pay | Admitting: Internal Medicine

## 2016-08-20 ENCOUNTER — Ambulatory Visit (HOSPITAL_COMMUNITY): Payer: Self-pay | Admitting: Pharmacist

## 2016-08-20 LAB — POCT INR: INR: 2.8

## 2016-08-23 ENCOUNTER — Other Ambulatory Visit: Payer: Self-pay | Admitting: Internal Medicine

## 2016-08-27 ENCOUNTER — Ambulatory Visit (HOSPITAL_COMMUNITY): Payer: Self-pay | Admitting: Pharmacist

## 2016-08-27 DIAGNOSIS — Z7901 Long term (current) use of anticoagulants: Secondary | ICD-10-CM | POA: Diagnosis not present

## 2016-08-27 LAB — POCT INR: INR: 2.8

## 2016-09-03 ENCOUNTER — Ambulatory Visit (HOSPITAL_COMMUNITY): Payer: Self-pay | Admitting: Pharmacist

## 2016-09-03 LAB — POCT INR: INR: 2.8

## 2016-09-05 ENCOUNTER — Ambulatory Visit (INDEPENDENT_AMBULATORY_CARE_PROVIDER_SITE_OTHER): Payer: Medicare Other | Admitting: Internal Medicine

## 2016-09-05 ENCOUNTER — Encounter: Payer: Self-pay | Admitting: Internal Medicine

## 2016-09-05 VITALS — BP 110/88 | HR 73 | Temp 97.5°F | Resp 16 | Ht 60.0 in | Wt 174.0 lb

## 2016-09-05 DIAGNOSIS — M25562 Pain in left knee: Secondary | ICD-10-CM | POA: Diagnosis not present

## 2016-09-05 DIAGNOSIS — G8929 Other chronic pain: Secondary | ICD-10-CM | POA: Diagnosis not present

## 2016-09-05 DIAGNOSIS — M1712 Unilateral primary osteoarthritis, left knee: Secondary | ICD-10-CM | POA: Diagnosis not present

## 2016-09-05 DIAGNOSIS — N182 Chronic kidney disease, stage 2 (mild): Secondary | ICD-10-CM

## 2016-09-05 MED ORDER — METHYLPREDNISOLONE ACETATE 40 MG/ML IJ SUSP
40.0000 mg | Freq: Once | INTRAMUSCULAR | Status: AC
  Administered 2016-09-05: 40 mg via INTRA_ARTICULAR

## 2016-09-05 MED ORDER — METHYLPREDNISOLONE ACETATE 40 MG/ML IJ SUSP: 40.0000 mg | Freq: Once | INTRAMUSCULAR | Status: DC

## 2016-09-05 NOTE — Progress Notes (Signed)
Subjective:  Patient ID: [name], female    DOB: [birth year]  Age: 82 y.o. MRN: [id number]  CC: Knee Pain   HPI NIJA WENDLAND presents for a 4 week history of worsening left knee pain. She denies any recent trauma or injury. She has not noticed any swelling. She does complain of decreased range of motion. She takes tramadol intermittently for the pain.  Outpatient Medications Prior to Visit  Medication Sig Dispense Refill  . amiodarone (PACERONE) 200 MG tablet Take 0.5 tablets (100 mg total) by mouth daily. 90 tablet 3  . carvedilol (COREG) 6.25 MG tablet Take 1 tablet (6.25 mg total) by mouth 2 (two) times daily with a meal. 60 tablet 5  . Cholecalciferol (VITAMIN D3) 1000 UNITS CAPS Take 1 tablet by mouth daily.      [name] docusate sodium (COLACE) 100 MG capsule Take 1 capsule (100 mg total) by mouth 2 (two) times daily. (Patient taking differently: Take 100 mg by mouth daily. ) 60 capsule 6  . ferrous sulfate 325 (65 FE) MG tablet Take 1 tablet (325 mg total) by mouth 2 (two) times daily with a meal. 60 tablet 6  . gabapentin (NEURONTIN) 100 MG capsule TAKE 1 CAPSULE BY MOUTH AT BEDTIME OR AS DIRECTED 30 capsule 5  . hydrALAZINE (APRESOLINE) 50 MG tablet Take 1.5 tablets (75 mg total) by mouth 3 (three) times daily. 270 tablet 3  . losartan (COZAAR) 50 MG tablet Take 2 tablets (100 mg total) by mouth daily. 30 tablet 6  . pantoprazole (PROTONIX) 40 MG tablet TAKE 1 TABLET BY MOUTH EVERY DAY 30 tablet 6  . potassium chloride SA (K-DUR,KLOR-CON) 20 MEQ tablet Take 20 mEq by mouth 2 (two) times daily.    . traMADol (ULTRAM) 50 MG tablet Take 1 tablet (50 mg total) by mouth every 12 (twelve) hours as needed for moderate pain. 30 tablet 3  . warfarin (COUMADIN) 2.5 MG tablet Take 5 mg by mouth daily.    [name] KLOR-CON M20 20 MEQ tablet TAKE 2 TABLETS BY MOUTH DAILY. 80 tablet 2   No facility-administered medications prior to visit.     ROS Review of Systems  Constitutional: Negative for  chills, fatigue and fever.  HENT: Negative.   Eyes: Negative for visual disturbance.  Respiratory: Negative for cough, chest tightness, shortness of breath and wheezing.   Cardiovascular: Negative for chest pain, palpitations and leg swelling.  Gastrointestinal: Negative.  Negative for abdominal pain, diarrhea, nausea and vomiting.  Endocrine: Negative.   Genitourinary: Negative.   Musculoskeletal: Positive for arthralgias. Negative for myalgias and neck pain.  Skin: Negative.  Negative for color change and rash.  Hematological: Negative for adenopathy. Does not bruise/bleed easily.    Objective:  BP 110/88 (BP Location: Right Arm, Patient Position: Sitting, Cuff Size: Large)   Pulse 73   Temp 97.5 F (36.4 C) (Oral)   Resp 16   Ht 5' (1.524 m)   Wt 174 lb (78.9 kg)   SpO2 98%   BMI 33.98 kg/m   BP Readings from Last 3 Encounters:  09/05/16 110/88  08/08/16 117/85  08/06/16 (!) 136/0    Wt Readings from Last 3 Encounters:  09/05/16 174 lb (78.9 kg)  08/08/16 180 lb (81.6 kg)  08/06/16 181 lb 12.8 oz (82.5 kg)    Physical Exam  Constitutional:  Wheelchair-bound  Musculoskeletal:       Left knee: She exhibits decreased range of motion, deformity (DJD) and bony tenderness. She exhibits  no swelling and no effusion. No tenderness found.  Left knee was cleaned with Betadine and prepped and draped in sterile fashion.  The joint space was approached from the medial aspect and 1 mL of 0.5% plain Marcaine and 40 mg of Depo-Medrol was easily injected into the joint space. She tolerated this well with no bleeding or complications.    Lab Results  Component Value Date   WBC 5.4 08/06/2016   HGB 11.2 (L) 08/06/2016   HCT 35.4 (L) 08/06/2016   PLT 201 08/06/2016   GLUCOSE 88 08/06/2016   CHOL  05/05/2010    130        ATP III CLASSIFICATION:  <200     mg/dL   Desirable  200-239  mg/dL   Borderline High  >=240    mg/dL   High          TRIG 90 05/05/2010   HDL 36 (L)  05/05/2010   LDLCALC  05/05/2010    76        Total Cholesterol/HDL:CHD Risk Coronary Heart Disease Risk Table                     Men   Women  1/2 Average Risk   3.4   3.3  Average Risk       5.0   4.4  2 X Average Risk   9.6   7.1  3 X Average Risk  23.4   11.0        Use the calculated Patient Ratio above and the CHD Risk Table to determine the patient's CHD Risk.        ATP III CLASSIFICATION (LDL):  <100     mg/dL   Optimal  100-129  mg/dL   Near or Above                    Optimal  130-159  mg/dL   Borderline  160-189  mg/dL   High  >190     mg/dL   Very High   ALT 15 08/06/2016   AST 21 08/06/2016   NA 138 08/06/2016   K 4.4 08/06/2016   CL 109 08/06/2016   CREATININE 0.85 08/06/2016   BUN 11 08/06/2016   CO2 26 08/06/2016   TSH 4.678 (H) 08/06/2016   INR 3.2 09/10/2016   HGBA1C 5.7 (H) 04/05/2013    No results found.  Assessment & Plan:   Jomanda was seen today for knee pain.  Diagnoses and all orders for this visit:  Chronic pain of left knee -     Discontinue: methylPREDNISolone acetate (DEPO-MEDROL) injection 40 mg; Inject 1 mL (40 mg total) into the muscle once. -     methylPREDNISolone acetate (DEPO-MEDROL) injection 40 mg; Inject 1 mL (40 mg total) into the articular space once.  Primary osteoarthritis of left knee- to relieve her pain I gave her an injection of Depo-Medrol and Marcaine.  Chronic renal insufficiency, stage II (mild)   I have discontinued Ms. Stolar KLOR-CON M20. I am also having her maintain her Vitamin D3, ferrous sulfate, docusate sodium, amiodarone, potassium chloride SA, carvedilol, pantoprazole, gabapentin, losartan, traMADol, warfarin, and hydrALAZINE. We administered methylPREDNISolone acetate.  Meds ordered this encounter  Medications  . DISCONTD: methylPREDNISolone acetate (DEPO-MEDROL) injection 40 mg  . methylPREDNISolone acetate (DEPO-MEDROL) injection 40 mg     Follow-up: Return if symptoms worsen or fail to  improve.  Scarlette Calico, MD

## 2016-09-05 NOTE — Progress Notes (Signed)
Pre visit review using our clinic review tool, if applicable. No additional management support is needed unless otherwise documented below in the visit note. 

## 2016-09-05 NOTE — Patient Instructions (Signed)
Knee Injection, Care After Refer to this sheet in the next few weeks. These instructions provide you with information about caring for yourself after your procedure. Your health care provider may also give you more specific instructions. Your treatment has been planned according to current medical practices, but problems sometimes occur. Call your health care provider if you have any problems or questions after your procedure. What can I expect after the procedure? After the procedure, it is common to have:  Soreness.  Warmth.  Swelling. You may have more pain, swelling, and warmth than you did before the injection. This reaction may last for about one day. Follow these instructions at home: Bathing   If you were given a bandage (dressing), keep it dry until your health care provider says it can be removed. Ask your health care provider when you can start showering or taking a bath. Managing pain, stiffness, and swelling   If directed, apply ice to the injection area:  Put ice in a plastic bag.  Place a towel between your skin and the bag.  Leave the ice on for 20 minutes, 2-3 times per day.  Do not apply heat to your knee.  Raise the injection area above the level of your heart while you are sitting or lying down. Activity   Avoid strenuous activities for as long as directed by your health care provider. Ask your health care provider when you can return to your normal activities. General instructions   Take medicines only as directed by your health care provider.  Do not take aspirin or other over-the-counter medicines unless your health care provider says you can.  Check your injection site every day for signs of infection. Watch for:  Redness, swelling, or pain.  Fluid, blood, or pus.  Follow your health care provider's instructions about dressing changes and removal. Contact a health care provider if:  You have symptoms at your injection site that last longer than  two days after your procedure.  You have redness, swelling, or pain in your injection area.  You have fluid, blood, or pus coming from your injection site.  You have warmth in your injection area.  You have a fever.  Your pain is not controlled with medicine. Get help right away if:  Your knee turns very red.  Your knee becomes very swollen.  Your knee pain is severe. This information is not intended to replace advice given to you by your health care provider. Make sure you discuss any questions you have with your health care provider. Document Released: 09/09/2014 Document Revised: 04/24/2016 Document Reviewed: 06/29/2014 Elsevier Interactive Patient Education  2017 Elsevier Inc.  

## 2016-09-10 ENCOUNTER — Ambulatory Visit (HOSPITAL_COMMUNITY): Payer: Self-pay | Admitting: Pharmacist

## 2016-09-10 LAB — POCT INR: INR: 3.2

## 2016-09-12 ENCOUNTER — Other Ambulatory Visit (HOSPITAL_COMMUNITY): Payer: Self-pay | Admitting: Internal Medicine

## 2016-09-13 ENCOUNTER — Telehealth: Payer: Self-pay | Admitting: Internal Medicine

## 2016-09-13 NOTE — Telephone Encounter (Signed)
[facility] called stating Brianna Hanson req pneumonia injection from them but they are unsure which one to give her due to health condition and last one he had. Please give [facility] a call back today, pt was waiting at the pharmacy for.

## 2016-09-13 NOTE — Telephone Encounter (Signed)
Advised [facility] that patient needs prevnar 13

## 2016-09-14 DIAGNOSIS — Z23 Encounter for immunization: Secondary | ICD-10-CM | POA: Diagnosis not present

## 2016-09-18 LAB — POCT INR: INR: 2.6

## 2016-09-20 ENCOUNTER — Ambulatory Visit (HOSPITAL_COMMUNITY): Payer: Self-pay | Admitting: Pharmacist

## 2016-09-21 ENCOUNTER — Telehealth: Payer: Self-pay | Admitting: Cardiology

## 2016-09-21 ENCOUNTER — Inpatient Hospital Stay (HOSPITAL_COMMUNITY)
Admission: EM | Admit: 2016-09-21 | Discharge: 2016-09-30 | DRG: 378 | Disposition: A | Discharge status: Home / Self Care | Payer: Medicare Other | Attending: Internal Medicine | Admitting: Internal Medicine

## 2016-09-21 ENCOUNTER — Encounter (HOSPITAL_COMMUNITY): Payer: Self-pay

## 2016-09-21 DIAGNOSIS — Z79899 Other long term (current) drug therapy: Secondary | ICD-10-CM | POA: Diagnosis not present

## 2016-09-21 DIAGNOSIS — D62 Acute posthemorrhagic anemia: Secondary | ICD-10-CM

## 2016-09-21 DIAGNOSIS — Z95811 Presence of heart assist device: Secondary | ICD-10-CM | POA: Diagnosis not present

## 2016-09-21 DIAGNOSIS — K5731 Diverticulosis of large intestine without perforation or abscess with bleeding: Principal | ICD-10-CM

## 2016-09-21 DIAGNOSIS — I5022 Chronic systolic (congestive) heart failure: Secondary | ICD-10-CM | POA: Diagnosis not present

## 2016-09-21 DIAGNOSIS — E869 Volume depletion, unspecified: Secondary | ICD-10-CM | POA: Diagnosis present

## 2016-09-21 DIAGNOSIS — D472 Monoclonal gammopathy: Secondary | ICD-10-CM | POA: Diagnosis present

## 2016-09-21 DIAGNOSIS — Z823 Family history of stroke: Secondary | ICD-10-CM | POA: Diagnosis not present

## 2016-09-21 DIAGNOSIS — K922 Gastrointestinal hemorrhage, unspecified: Secondary | ICD-10-CM

## 2016-09-21 DIAGNOSIS — I50810 Right heart failure, unspecified: Secondary | ICD-10-CM

## 2016-09-21 DIAGNOSIS — R112 Nausea with vomiting, unspecified: Secondary | ICD-10-CM | POA: Diagnosis not present

## 2016-09-21 DIAGNOSIS — Z7901 Long term (current) use of anticoagulants: Secondary | ICD-10-CM

## 2016-09-21 DIAGNOSIS — E876 Hypokalemia: Secondary | ICD-10-CM | POA: Diagnosis present

## 2016-09-21 DIAGNOSIS — N179 Acute kidney failure, unspecified: Secondary | ICD-10-CM | POA: Diagnosis not present

## 2016-09-21 DIAGNOSIS — K921 Melena: Secondary | ICD-10-CM

## 2016-09-21 DIAGNOSIS — I110 Hypertensive heart disease with heart failure: Secondary | ICD-10-CM | POA: Diagnosis present

## 2016-09-21 DIAGNOSIS — Z95810 Presence of automatic (implantable) cardiac defibrillator: Secondary | ICD-10-CM

## 2016-09-21 DIAGNOSIS — D689 Coagulation defect, unspecified: Secondary | ICD-10-CM

## 2016-09-21 DIAGNOSIS — I509 Heart failure, unspecified: Secondary | ICD-10-CM | POA: Diagnosis not present

## 2016-09-21 DIAGNOSIS — A084 Viral intestinal infection, unspecified: Secondary | ICD-10-CM | POA: Diagnosis not present

## 2016-09-21 DIAGNOSIS — I428 Other cardiomyopathies: Secondary | ICD-10-CM | POA: Diagnosis present

## 2016-09-21 DIAGNOSIS — D696 Thrombocytopenia, unspecified: Secondary | ICD-10-CM | POA: Diagnosis present

## 2016-09-21 DIAGNOSIS — I480 Paroxysmal atrial fibrillation: Secondary | ICD-10-CM | POA: Diagnosis present

## 2016-09-21 DIAGNOSIS — M8580 Other specified disorders of bone density and structure, unspecified site: Secondary | ICD-10-CM | POA: Diagnosis present

## 2016-09-21 DIAGNOSIS — D649 Anemia, unspecified: Secondary | ICD-10-CM | POA: Diagnosis not present

## 2016-09-21 DIAGNOSIS — Z8674 Personal history of sudden cardiac arrest: Secondary | ICD-10-CM

## 2016-09-21 LAB — COMPREHENSIVE METABOLIC PANEL
ALT: 22 U/L (ref 14–54)
AST: 28 U/L (ref 15–41)
Albumin: 3.2 g/dL — ABNORMAL LOW (ref 3.5–5.0)
Alkaline Phosphatase: 84 U/L (ref 38–126)
Anion gap: 8 (ref 5–15)
BUN: 17 mg/dL (ref 6–20)
CO2: 20 mmol/L — ABNORMAL LOW (ref 22–32)
Calcium: 9.1 mg/dL (ref 8.9–10.3)
Chloride: 103 mmol/L (ref 101–111)
Creatinine, Ser: 1.44 mg/dL — ABNORMAL HIGH (ref 0.44–1.00)
GFR calc Af Amer: 39 mL/min — ABNORMAL LOW (ref >60)
GFR calc non Af Amer: 34 mL/min — ABNORMAL LOW (ref >60)
Glucose, Bld: 95 mg/dL (ref 65–99)
Potassium: 3.6 mmol/L (ref 3.5–5.1)
Sodium: 131 mmol/L — ABNORMAL LOW (ref 135–145)
Total Bilirubin: 0.6 mg/dL (ref 0.3–1.2)
Total Protein: 8.4 g/dL — ABNORMAL HIGH (ref 6.5–8.1)

## 2016-09-21 LAB — CBC WITH DIFFERENTIAL/PLATELET
Basophils Absolute: 0 10*3/uL (ref 0.0–0.1)
Basophils Relative: 0 %
Eosinophils Absolute: 0.1 10*3/uL (ref 0.0–0.7)
Eosinophils Relative: 1 %
HCT: 39.5 % (ref 36.0–46.0)
Hemoglobin: 12.9 g/dL (ref 12.0–15.0)
Lymphocytes Relative: 25 %
Lymphs Abs: 1.8 10*3/uL (ref 0.7–4.0)
MCH: 29.9 pg (ref 26.0–34.0)
MCHC: 32.7 g/dL (ref 30.0–36.0)
MCV: 91.4 fL (ref 78.0–100.0)
Monocytes Absolute: 1.2 10*3/uL — ABNORMAL HIGH (ref 0.1–1.0)
Monocytes Relative: 17 %
Neutro Abs: 4.1 10*3/uL (ref 1.7–7.7)
Neutrophils Relative %: 57 %
Platelets: 177 10*3/uL (ref 150–400)
RBC: 4.32 MIL/uL (ref 3.87–5.11)
RDW: 16.1 % — ABNORMAL HIGH (ref 11.5–15.5)
WBC: 7.2 10*3/uL (ref 4.0–10.5)

## 2016-09-21 LAB — PROTIME-INR
INR: 2.53
Prothrombin Time: 27.7 seconds — ABNORMAL HIGH (ref 11.4–15.2)

## 2016-09-21 LAB — MRSA PCR SCREENING: MRSA by PCR: NEGATIVE

## 2016-09-21 LAB — POC OCCULT BLOOD, ED: Fecal Occult Bld: POSITIVE — AB

## 2016-09-21 MED ORDER — SODIUM CHLORIDE 0.9 % IV BOLUS (SEPSIS)
2000.0000 mL | Freq: Once | INTRAVENOUS | Status: AC
  Administered 2016-09-21: 2000 mL via INTRAVENOUS

## 2016-09-21 MED ORDER — POTASSIUM CHLORIDE CRYS ER 20 MEQ PO TBCR
20.0000 meq | EXTENDED_RELEASE_TABLET | Freq: Two times a day (BID) | ORAL | Status: DC
  Administered 2016-09-21 – 2016-09-30 (×18): 20 meq via ORAL
  Filled 2016-09-21 (×18): qty 1

## 2016-09-21 MED ORDER — LOSARTAN POTASSIUM 50 MG PO TABS
100.0000 mg | ORAL_TABLET | Freq: Every day | ORAL | Status: DC
  Administered 2016-09-21 – 2016-09-30 (×10): 100 mg via ORAL
  Filled 2016-09-21 (×10): qty 2

## 2016-09-21 MED ORDER — PANTOPRAZOLE SODIUM 40 MG PO TBEC
40.0000 mg | DELAYED_RELEASE_TABLET | Freq: Every day | ORAL | Status: DC
  Administered 2016-09-21 – 2016-09-30 (×10): 40 mg via ORAL
  Filled 2016-09-21 (×10): qty 1

## 2016-09-21 MED ORDER — AMIODARONE HCL 100 MG PO TABS
100.0000 mg | ORAL_TABLET | Freq: Every day | ORAL | Status: DC
  Administered 2016-09-21 – 2016-09-30 (×10): 100 mg via ORAL
  Filled 2016-09-21 (×10): qty 1

## 2016-09-21 MED ORDER — CARVEDILOL 3.125 MG PO TABS
3.1250 mg | ORAL_TABLET | Freq: Two times a day (BID) | ORAL | Status: DC
  Administered 2016-09-21 – 2016-09-25 (×9): 3.125 mg via ORAL
  Filled 2016-09-21 (×9): qty 1

## 2016-09-21 MED ORDER — HYDRALAZINE HCL 50 MG PO TABS
50.0000 mg | ORAL_TABLET | Freq: Three times a day (TID) | ORAL | Status: DC
  Administered 2016-09-21 – 2016-09-22 (×4): 50 mg via ORAL
  Filled 2016-09-21 (×4): qty 1

## 2016-09-21 MED ORDER — GABAPENTIN 100 MG PO CAPS
100.0000 mg | ORAL_CAPSULE | Freq: Every day | ORAL | Status: DC
  Administered 2016-09-21 – 2016-09-29 (×9): 100 mg via ORAL
  Filled 2016-09-21 (×9): qty 1

## 2016-09-21 MED ORDER — WARFARIN SODIUM 2.5 MG PO TABS
2.5000 mg | ORAL_TABLET | Freq: Once | ORAL | Status: AC
  Administered 2016-09-21: 2.5 mg via ORAL
  Filled 2016-09-21: qty 1

## 2016-09-21 MED ORDER — WARFARIN - PHARMACIST DOSING INPATIENT: Freq: Every day | Status: DC

## 2016-09-21 NOTE — H&P (Signed)
VAD TEAM History & Physical Note   Reason for Admission: N/V, melena, AKI  HPI:     Brianna Hanson is a delightful 82 year old female with a PMH of HF due to severe NICM, chronic systolic HF,  PAF, plasma cell disorder (Likely IgA MGUS) - followed by Dr. [name], She is s/p implantation of the HeartMate II LVAD on 04/06/13 for DT. She is not on aspirin due to dizziness.   Admitted to [name] from 12/4 to 08/18/15 for acute cholecystitis. Underwent placement of a cholecystotomy drain by IR.  On 2/17 had successful laparoscopic cholecystectomy.   On Thursday developed nausea and vomitting [name] had this before her as well). Couldn't hold anything down. Began to notice low flow alarms on VAD. Over past two days n/v a bit better but has developed several episodes of melena with multiple low flow alarms on VAD. Feeling very weak so paged VAD pager and I told her to come to ER. Denies ab pain. No fever but feels cold at times.   INR was 3.2 last week but 2.6 yesterday.  In ER. FOBT +/ Hgb 12.9. Very weak. MAP ok. Creatinine 0.8-> 1.4. INR 2.5   LVAD INTERROGATION:  HeartMate II LVAD:  Flow 3.2 liters/min, speed 8800, power 4.2, PI 6.3. Innumerable PI events and low flow alarms over past 48 hours.      Review of Systems: [y] = yes, [ ]  = no   General: Weight gain [ ] ; Weight loss [ ] ; Anorexia [ y]; Fatigue [ y]; Fever [ ] ; Chills [ ] ; Weakness Blue.Reese ]  Cardiac: Chest pain/pressure [ ] ; Resting SOB [ ] ; Exertional SOB [ ] ; Orthopnea [ ] ; Pedal Edema [ ] ; Palpitations [ ] ; Syncope [ ] ; Presyncope [ ] ; Paroxysmal nocturnal dyspnea[ ]   Pulmonary: Cough [ ] ; Wheezing[ ] ; Hemoptysis[ ] ; Sputum [ ] ; Snoring [ ]   GI: Vomiting[y ]; Dysphagia[ ] ; Melena[y ]; Hematochezia [ ] ; Heartburn[ ] ; Abdominal pain [ ] ; Constipation [ ] ; Diarrhea [ ] ; BRBPR [ ]   GU: Hematuria[ ] ; Dysuria [ ] ; Nocturia[ ]   Vascular: Pain in legs with walking [ ] ; Pain in feet with lying flat [ ] ; Non-healing sores [ ] ; Stroke [ ] ;  TIA [ ] ; Slurred speech [ ] ;  Neuro: Headaches[ ] ; Vertigo[ ] ; Seizures[ ] ; Paresthesias[ ] ;Blurred vision [ ] ; Diplopia [ ] ; Vision changes [ ]   Ortho/Skin: Arthritis Blue.Reese ]; Joint pain [ y]; Muscle pain [ ] ; Joint swelling [ ] ; Back Pain [ ] ; Rash [ ]   Psych: Depression[ ] ; Anxiety[ ]   Heme: Bleeding problems Blue.Reese ]; Clotting disorders [ ] ; Anemia [ ]   Endocrine: Diabetes [ ] ; Thyroid dysfunction[ ]   Home Medications Prior to Admission medications   Medication Sig Start Date End Date Taking? Authorizing Provider  amiodarone (PACERONE) 200 MG tablet Take 0.5 tablets (100 mg total) by mouth daily. 10/19/15   Jolaine Artist, MD  carvedilol (COREG) 6.25 MG tablet Take 1 tablet (6.25 mg total) by mouth 2 (two) times daily with a meal. 01/01/16   Jolaine Artist, MD  Cholecalciferol (VITAMIN D3) 1000 UNITS CAPS Take 1 tablet by mouth daily.      Historical Provider, MD  docusate sodium (COLACE) 100 MG capsule Take 1 capsule (100 mg total) by mouth 2 (two) times daily. Patient taking differently: Take 100 mg by mouth daily.  07/11/15   Jolaine Artist, MD  ferrous sulfate 325 (65 FE) MG tablet  Take 1 tablet (325 mg total) by mouth 2 (two) times daily with a meal. 07/11/15   Jolaine Artist, MD  gabapentin (NEURONTIN) 100 MG capsule TAKE ONE CAPSULE BY MOUTH AT BEDTIME OR AS DIRECTED 09/12/16   Jolaine Artist, MD  hydrALAZINE (APRESOLINE) 50 MG tablet Take 1.5 tablets (75 mg total) by mouth 3 (three) times daily. 08/08/16 11/06/16  Jolaine Artist, MD  losartan (COZAAR) 50 MG tablet Take 2 tablets (100 mg total) by mouth daily. 06/25/16   Jolaine Artist, MD  pantoprazole (PROTONIX) 40 MG tablet TAKE 1 TABLET BY MOUTH EVERY DAY 02/15/16   Jolaine Artist, MD  potassium chloride SA (K-DUR,KLOR-CON) 20 MEQ tablet Take 20 mEq by mouth 2 (two) times daily.    Historical Provider, MD  traMADol (ULTRAM) 50 MG tablet Take 1 tablet (50 mg total) by mouth every 12 (twelve) hours as needed for  moderate pain. 07/01/16   Jolaine Artist, MD  warfarin (COUMADIN) 2.5 MG tablet Take 5 mg by mouth daily.    Historical Provider, MD    Past Medical History: Past Medical History:  Diagnosis Date  . AICD (automatic cardioverter/defibrillator) present   . Anemia   . Atrial fibrillation or flutter   . Cardiac arrest - ventricular fibrillation 12/10   with successful resucitation, S/p ICD  . CHF (congestive heart failure) ([place])   . Diverticula of colon 2011  . HTN (hypertension)    moderate  . ICD (implantable cardiac defibrillator) in place    she has received appropriate therapy for VF  . Internal and external hemorrhoids without complication [id number]  . Nonischemic cardiomyopathy ([place])    followed by Dr April Holding at [facility]  . Osteopenia   . Plasma cell disorder 03/20/2012  . Plasma cell disorder 03/20/2012  . Sessile colonic polyp 2011   Dr Benson Norway    Past Surgical History: Past Surgical History:  Procedure Laterality Date  . ABDOMINAL HYSTERECTOMY    . CARDIAC CATHETERIZATION    . CARDIAC DEFIBRILLATOR PLACEMENT     by Greggory Brandy for secondary prevention of sudden death  . CHOLECYSTECTOMY N/A 2015/10/24   Procedure: LAPAROSCOPIC CHOLECYSTECTOMY;  Surgeon: Rolm Bookbinder, MD;  Location: [place];  Service: General;  Laterality: N/A;  . COLONOSCOPY W/ POLYPECTOMY  2011   Dr Benson Norway  . INSERTION OF IMPLANTABLE LEFT VENTRICULAR ASSIST DEVICE N/A 04/06/2013   Procedure: INSERTION OF IMPLANTABLE LEFT VENTRICULAR ASSIST DEVICE;  Surgeon: Gaye Pollack, MD;  Location: [place];  Service: Open Heart Surgery;  Laterality: N/A;  . INTRAOPERATIVE TRANSESOPHAGEAL ECHOCARDIOGRAM N/A 04/06/2013   Procedure: INTRAOPERATIVE TRANSESOPHAGEAL ECHOCARDIOGRAM;  Surgeon: Gaye Pollack, MD;  Location: [place] [facility];  Service: Open Heart Surgery;  Laterality: N/A;  . PLACEMENT OF CENTRIMAG VENTRICULAR ASSIST DEVICE Right 04/06/2013   Procedure: PLACEMENT OF CENTRIMAG VENTRICULAR ASSIST DEVICE;  Surgeon: Gaye Pollack, MD;   Location: [place];  Service: Open Heart Surgery;  Laterality: Right;    Family History: Family History  Problem Relation Age of Onset  . Stroke Mother   . Stroke Brother     Social History: Social History   Social History  . Marital status: Single    Spouse name: N/A  . Number of children: 3  . Years of education: N/A   Social History Main Topics  . Smoking status: Never Smoker  . Smokeless tobacco: Never Used     Comment: remote history of tobacco abuse  . Alcohol use Yes     Comment: occsionally  wine  . Drug use: No  . Sexual activity: Not Currently    Birth control/ protection: Surgical   Other Topics Concern  . None   Social History Narrative   Lives with spouse who was recently diagnosed with esophageal ca and is receiving chemotherapy    Allergies:  No Known Allergies  Objective:    Vital Signs:   Temp:  [97.8 F (36.6 C)] 97.8 F (36.6 C) (01/20 0915) Pulse Rate:  [78-86] 78 (01/20 1030) Resp:  [11-21] 15 (01/20 1030) SpO2:  [100 %] 100 % (01/20 1030) Weight:  [77.1 kg (170 lb)] 77.1 kg (170 lb) (01/20 0911)   Filed Weights   09/21/16 0911  Weight: 77.1 kg (170 lb)    Mean arterial Pressure 85  Physical Exam: General:  Ftigued appearing. No resp difficulty HEENT: normal Neck: supple. JVP flat . Carotids 2+ bilat; no bruits. No lymphadenopathy or thryomegaly appreciated. Cor: Mechanical heart sounds with LVAD hum present. Lungs: clear Abdomen: soft, nontender, nondistended. No hepatosplenomegaly. No bruits or masses. Good bowel sounds. Driveline: C/D/I; securement device intact and driveline incorporated Extremities: no cyanosis, clubbing, rash, edema Neuro: alert & orientedx3, cranial nerves grossly intact. moves all 4 extremities w/o difficulty. Affect pleasant  Telemetry: NSR 70s  Labs: Basic Metabolic Panel:  Recent Labs Lab 09/21/16 0949  NA 131*  K 3.6  CL 103  CO2 20*  GLUCOSE 95  BUN 17  CREATININE 1.44*  CALCIUM 9.1     Liver Function Tests:  Recent Labs Lab 09/21/16 0949  AST 28  ALT 22  ALKPHOS 84  BILITOT 0.6  PROT 8.4*  ALBUMIN 3.2*   No results for input(s): LIPASE, AMYLASE in the last 168 hours. No results for input(s): AMMONIA in the last 168 hours.  CBC:  Recent Labs Lab 09/21/16 0949  WBC 7.2  NEUTROABS 4.1  HGB 12.9  HCT 39.5  MCV 91.4  PLT 177    Cardiac Enzymes: No results for input(s): CKTOTAL, CKMB, CKMBINDEX, TROPONINI in the last 168 hours.  BNP: BNP (last 3 results)  Recent Labs  08/06/16 1103  BNP 474.2*    ProBNP (last 3 results) No results for input(s): PROBNP in the last 8760 hours.   CBG: No results for input(s): GLUCAP in the last 168 hours.  Coagulation Studies:  Recent Labs  09/21/16 0949  LABPROT 27.7*  INR 2.53    Other results: EKG: pending  Imaging:  No results found.      Assessment/plan:   1. Nausea/vomiting - likely viral gastroenteritis. Multiple low flow alarms on VAD    --likely volume depleted. Give 2L NS. Advance diet as tolerated    --no evidence of obstruction on exam. Can get KUB as needed.  2. Melena    --hgb stable. Suspect could be Mallory-Weiss tear from recurrent N/V. Seems to have slowed. Will follow. If recurs or hgb drops can get GI to see. Will continue coumadin but keep INR close to 2.0    --start PPI 3. Chronic systolic HF s/p HM-II VAD    --VAD interrogated personally. Multiple low flow alarms. Will hydrate 4. HTN     --BP ok. Will cut anti-HTN meds down a bit. Can titrate back up as needed.  5. AKI    --due to volume depletion. Hydrate.   Length of Stay: 0  Glori Bickers MD 09/21/2016, 11:36 AM VAD Team Pager [phone] (7am - 7am) +++VAD ISSUES ONLY+++   Advanced Heart Failure Team Pager [phone] (M-F; 7a - 4p)  Please contact [facility] for night-coverage after hours (4p -7a ) and weekends on [email or web address] for all non- LVAD Issues

## 2016-09-21 NOTE — Progress Notes (Signed)
ANTICOAGULATION CONSULT NOTE - Initial Consult  Pharmacy Consult for warfarin Indication: LVAD  No Known Allergies  Patient Measurements: Height: 5' (152.4 cm) Weight: 170 lb (77.1 kg) IBW/kg (Calculated) : 45.5   Vital Signs: Temp: 97.8 F (36.6 C) (01/20 0915) Temp Source: Oral (01/20 0915) Pulse Rate: 78 (01/20 1030)  Labs:  Recent Labs  09/21/16 0949  HGB 12.9  HCT 39.5  PLT 177  LABPROT 27.7*  INR 2.53  CREATININE 1.44*    Estimated Creatinine Clearance: 29.1 mL/min (by C-G formula based on SCr of 1.44 mg/dL (H)).   Medical History: Past Medical History:  Diagnosis Date  . AICD (automatic cardioverter/defibrillator) present   . Anemia   . Atrial fibrillation or flutter   . Cardiac arrest - ventricular fibrillation 12/10   with successful resucitation, S/p ICD  . CHF (congestive heart failure) ([place])   . Diverticula of colon 2011  . HTN (hypertension)    moderate  . ICD (implantable cardiac defibrillator) in place    she has received appropriate therapy for VF  . Internal and external hemorrhoids without complication [id number]  . Nonischemic cardiomyopathy ([place])    followed by Dr April [facility] at [facility]  . Osteopenia   . Plasma cell disorder 03/20/2012  . Plasma cell disorder 03/20/2012  . Sessile colonic polyp 2011   Dr Benson Norway   Assessment: 82 year old female with a PMH of HF due to severe NICM, chronic systolic HF, PAF, plasma cell disorder (Likely IgA MGUS) - followed by Dr. [name], She is s/p implantation of the HeartMate II LVAD on 04/06/13 for DT.  Patient is on chronic coumadin for her LVAD, INR is at goal at 2.5 this afternoon. Patient is being admitted for intractable n/v and general malaise likely due to viral gastroenteritis. She has also had complaints of BRBPR and melana. Hgb is stable from previous admits so low concern at this time for active GI bleeding. Pantoprazole ordered. D/w cardiology and will continue low dose warfarin tonight with aim to keep  INR at low end of goal.   Goal of Therapy:  INR 2-3 (goal ~2 this admit) Monitor platelets by anticoagulation protocol: Yes   Plan:  Will order 2.5mg  of warfarin tonight Daily INR while in hospital  Erin Hearing PharmD., BCPS Clinical Pharmacist Pager [phone] 09/21/2016 1:59 PM

## 2016-09-21 NOTE — Telephone Encounter (Signed)
Pt family member called reporting a low flow alarm on LVAD device starting this morning. Also reports she has had bright red bleeding from her rectum that started last night. Takes coumadin, feeling weak. I instructed that she needs to come to the ED for further evaluation given her symptoms. Family and patient were agreeable and thanked me for the call back.   Reino Bellis, NP-C

## 2016-09-21 NOTE — ED Notes (Signed)
Called LVAD and RR about patient's care. Verbalized understanding. To be down shortly with cart

## 2016-09-21 NOTE — ED Notes (Signed)
Rapid Response at the bedside

## 2016-09-21 NOTE — ED Notes (Signed)
Pt.is undress into gown AND ON MONITOR.FAMILYAT BEDSIDE.BLANKET GIVEN

## 2016-09-21 NOTE — ED Triage Notes (Signed)
Per Pt, Pt is coming from home with complaints of dark red rectal bleeding. Pt denies any urinary symptoms or bleeding, abdominal pain. Reports N/V/D.

## 2016-09-21 NOTE — ED Provider Notes (Signed)
[place] DEPT Provider Note   CSN: [id number] Arrival date & time: 09/21/16  [id number]     History   Chief Complaint Chief Complaint  Patient presents with  . Rectal Bleeding    HPI Brianna Hanson is a 82 y.o. female.  Patient is a 82 year old female with past medical history of congestive heart failure with LVAD. She presents today for evaluation of maroon-colored stool. She is also receiving "low flow" alarms from her LVAD device. She denies any chest pain or shortness of breath. She denies any abdominal pain or vomiting. She denies any fevers or chills.  She is a patient of Dr. Sung Amabile.      Past Medical History:  Diagnosis Date  . AICD (automatic cardioverter/defibrillator) present   . Anemia   . Atrial fibrillation or flutter   . Cardiac arrest - ventricular fibrillation 12/10   with successful resucitation, S/p ICD  . CHF (congestive heart failure) ([place])   . Diverticula of colon 2011  . HTN (hypertension)    moderate  . ICD (implantable cardiac defibrillator) in place    she has received appropriate therapy for VF  . Internal and external hemorrhoids without complication [id number]  . Nonischemic cardiomyopathy ([place])    followed by Dr April Holding at [facility]  . Osteopenia   . Plasma cell disorder 03/20/2012  . Plasma cell disorder 03/20/2012  . Sessile colonic polyp 2011   Dr Benson Norway    Patient Active Problem List   Diagnosis Date Noted  . Chronic pain of left knee 09/05/2016  . Primary osteoarthritis of left knee 09/05/2016  . Iron deficiency anemia 07/11/2015  . Chronic anticoagulation 11/24/2013  . Chronic systolic heart failure ([place]) 05/01/2013  . LVAD (left ventricular assist device) present ([place]) 04/07/2013  . Right heart failure 03/29/2013  . PAH (pulmonary artery hypertension) 03/29/2013  . Plasma cell disorder 03/20/2012  . Long term current use of anticoagulant therapy 02/14/2012  . Other abnormal glucose 02/03/2012  . Chronic renal insufficiency, stage  II (mild) 02/03/2012  . Atrial fibrillation ([place]) 11/16/2010  . Essential hypertension, benign 05/13/2007  . CARDIOMYOPATHY, PRIMARY NEC 05/13/2007  . OSTEOPENIA 05/13/2007    Past Surgical History:  Procedure Laterality Date  . ABDOMINAL HYSTERECTOMY    . CARDIAC CATHETERIZATION    . CARDIAC DEFIBRILLATOR PLACEMENT     by Greggory Brandy for secondary prevention of sudden death  . CHOLECYSTECTOMY N/A 2015/10/22   Procedure: LAPAROSCOPIC CHOLECYSTECTOMY;  Surgeon: Rolm Bookbinder, MD;  Location: [place];  Service: General;  Laterality: N/A;  . COLONOSCOPY W/ POLYPECTOMY  2011   Dr Benson Norway  . INSERTION OF IMPLANTABLE LEFT VENTRICULAR ASSIST DEVICE N/A 04/06/2013   Procedure: INSERTION OF IMPLANTABLE LEFT VENTRICULAR ASSIST DEVICE;  Surgeon: Gaye Pollack, MD;  Location: [place];  Service: Open Heart Surgery;  Laterality: N/A;  . INTRAOPERATIVE TRANSESOPHAGEAL ECHOCARDIOGRAM N/A 04/06/2013   Procedure: INTRAOPERATIVE TRANSESOPHAGEAL ECHOCARDIOGRAM;  Surgeon: Gaye Pollack, MD;  Location: [place] OR;  Service: Open Heart Surgery;  Laterality: N/A;  . PLACEMENT OF CENTRIMAG VENTRICULAR ASSIST DEVICE Right 04/06/2013   Procedure: PLACEMENT OF CENTRIMAG VENTRICULAR ASSIST DEVICE;  Surgeon: Gaye Pollack, MD;  Location: [place];  Service: Open Heart Surgery;  Laterality: Right;    OB History    No data available       Home Medications    Prior to Admission medications   Medication Sig Start Date End Date Taking? Authorizing Provider  amiodarone (PACERONE) 200 MG tablet Take 0.5 tablets (100 mg total) by  mouth daily. 10/19/15   Jolaine Artist, MD  carvedilol (COREG) 6.25 MG tablet Take 1 tablet (6.25 mg total) by mouth 2 (two) times daily with a meal. 01/01/16   Jolaine Artist, MD  Cholecalciferol (VITAMIN D3) 1000 UNITS CAPS Take 1 tablet by mouth daily.      Historical Provider, MD  docusate sodium (COLACE) 100 MG capsule Take 1 capsule (100 mg total) by mouth 2 (two) times daily. Patient taking differently:  Take 100 mg by mouth daily.  07/11/15   Jolaine Artist, MD  ferrous sulfate 325 (65 FE) MG tablet Take 1 tablet (325 mg total) by mouth 2 (two) times daily with a meal. 07/11/15   Jolaine Artist, MD  gabapentin (NEURONTIN) 100 MG capsule TAKE ONE CAPSULE BY MOUTH AT BEDTIME OR AS DIRECTED 09/12/16   Jolaine Artist, MD  hydrALAZINE (APRESOLINE) 50 MG tablet Take 1.5 tablets (75 mg total) by mouth 3 (three) times daily. 08/08/16 11/06/16  Jolaine Artist, MD  losartan (COZAAR) 50 MG tablet Take 2 tablets (100 mg total) by mouth daily. 06/25/16   Jolaine Artist, MD  pantoprazole (PROTONIX) 40 MG tablet TAKE 1 TABLET BY MOUTH EVERY DAY 02/15/16   Jolaine Artist, MD  potassium chloride SA (K-DUR,KLOR-CON) 20 MEQ tablet Take 20 mEq by mouth 2 (two) times daily.    Historical Provider, MD  traMADol (ULTRAM) 50 MG tablet Take 1 tablet (50 mg total) by mouth every 12 (twelve) hours as needed for moderate pain. 07/01/16   Jolaine Artist, MD  warfarin (COUMADIN) 2.5 MG tablet Take 5 mg by mouth daily.    Historical Provider, MD    Family History Family History  Problem Relation Age of Onset  . Stroke Mother   . Stroke Brother     Social History Social History  Substance Use Topics  . Smoking status: Never Smoker  . Smokeless tobacco: Never Used     Comment: remote history of tobacco abuse  . Alcohol use Yes     Comment: occsionally wine     Allergies   Patient has no known allergies.   Review of Systems Review of Systems  All other systems reviewed and are negative.    Physical Exam Updated Vital Signs Pulse 81   Temp 97.8 F (36.6 C) (Oral)   Resp 15   Ht 5' (1.524 m)   Wt 170 lb (77.1 kg)   SpO2 100%   BMI 33.20 kg/m   Physical Exam  Constitutional: She is oriented to person, place, and time. She appears well-developed and well-nourished. No distress.  HENT:  Head: Normocephalic and atraumatic.  Neck: Normal range of motion. Neck supple.    Cardiovascular:  The hum of the LVAD device is audible  Pulmonary/Chest: Effort normal and breath sounds normal. No respiratory distress. She has no wheezes.  Abdominal: Soft. Bowel sounds are normal. She exhibits no distension. There is no tenderness.  Genitourinary: Rectal exam shows guaiac positive stool.  Genitourinary Comments: There is scant maroon-colored stool. There are no obvious masses per DRE.  Musculoskeletal: Normal range of motion.  Neurological: She is alert and oriented to person, place, and time.  Skin: Skin is warm and dry. She is not diaphoretic.  Nursing note and vitals reviewed.    ED Treatments / Results  Labs (all labs ordered are listed, but only abnormal results are displayed) Labs Reviewed  COMPREHENSIVE METABOLIC PANEL  CBC WITH DIFFERENTIAL/PLATELET  PROTIME-INR  POC OCCULT BLOOD, ED  TYPE AND SCREEN    EKG  EKG Interpretation None       Radiology No results found.  Procedures Procedures (including critical care time)  Medications Ordered in ED Medications - No data to display   Initial Impression / Assessment and Plan / ED Course  I have reviewed the triage vital signs and the nursing notes.  Pertinent labs & imaging results that were available during my care of the patient were reviewed by me and considered in my medical decision making (see chart for details).     Patient with history of LVAD device presents with a low-flow alarm and maroon-colored stool. Her stool is heme positive and room remainder laboratory is otherwise unremarkable. She was evaluated by cardiology who will admit and observe.  Final Clinical Impressions(s) / ED Diagnoses   Final diagnoses:  None    New Prescriptions New Prescriptions   No medications on file     Veryl Speak, MD 09/21/16 1521

## 2016-09-21 NOTE — ED Notes (Signed)
MD Bensihmon at the bedside with LVAD RN

## 2016-09-22 ENCOUNTER — Inpatient Hospital Stay (HOSPITAL_COMMUNITY): Payer: Medicare Other

## 2016-09-22 DIAGNOSIS — K922 Gastrointestinal hemorrhage, unspecified: Secondary | ICD-10-CM

## 2016-09-22 LAB — BASIC METABOLIC PANEL
Anion gap: 7 (ref 5–15)
BUN: 11 mg/dL (ref 6–20)
CO2: 18 mmol/L — ABNORMAL LOW (ref 22–32)
Calcium: 8.4 mg/dL — ABNORMAL LOW (ref 8.9–10.3)
Chloride: 112 mmol/L — ABNORMAL HIGH (ref 101–111)
Creatinine, Ser: 0.97 mg/dL (ref 0.44–1.00)
GFR calc Af Amer: >60 mL/min (ref >60)
GFR calc non Af Amer: 54 mL/min — ABNORMAL LOW (ref >60)
Glucose, Bld: 81 mg/dL (ref 65–99)
Potassium: 3.1 mmol/L — ABNORMAL LOW (ref 3.5–5.1)
Sodium: 137 mmol/L (ref 135–145)

## 2016-09-22 LAB — CBC WITH DIFFERENTIAL/PLATELET
Basophils Absolute: 0 10*3/uL (ref 0.0–0.1)
Basophils Relative: 0 %
Eosinophils Absolute: 0.2 10*3/uL (ref 0.0–0.7)
Eosinophils Relative: 5 %
HCT: 35.9 % — ABNORMAL LOW (ref 36.0–46.0)
Hemoglobin: 11.4 g/dL — ABNORMAL LOW (ref 12.0–15.0)
Lymphocytes Relative: 32 %
Lymphs Abs: 1.6 10*3/uL (ref 0.7–4.0)
MCH: 29.1 pg (ref 26.0–34.0)
MCHC: 31.8 g/dL (ref 30.0–36.0)
MCV: 91.6 fL (ref 78.0–100.0)
Monocytes Absolute: 0.6 10*3/uL (ref 0.1–1.0)
Monocytes Relative: 13 %
Neutro Abs: 2.5 10*3/uL (ref 1.7–7.7)
Neutrophils Relative %: 50 %
Platelets: 155 10*3/uL (ref 150–400)
RBC: 3.92 MIL/uL (ref 3.87–5.11)
RDW: 16 % — ABNORMAL HIGH (ref 11.5–15.5)
WBC: 5.1 10*3/uL (ref 4.0–10.5)

## 2016-09-22 LAB — PROTIME-INR
INR: 3.04
Prothrombin Time: 32.1 seconds — ABNORMAL HIGH (ref 11.4–15.2)

## 2016-09-22 LAB — CBC
HCT: 38 % (ref 36.0–46.0)
Hemoglobin: 12.2 g/dL (ref 12.0–15.0)
MCH: 29.3 pg (ref 26.0–34.0)
MCHC: 32.1 g/dL (ref 30.0–36.0)
MCV: 91.3 fL (ref 78.0–100.0)
Platelets: 180 10*3/uL (ref 150–400)
RBC: 4.16 MIL/uL (ref 3.87–5.11)
RDW: 15.8 % — ABNORMAL HIGH (ref 11.5–15.5)
WBC: 5 10*3/uL (ref 4.0–10.5)

## 2016-09-22 LAB — LACTATE DEHYDROGENASE: LDH: 182 U/L (ref 98–192)

## 2016-09-22 MED ORDER — SODIUM CHLORIDE 0.9 % IV SOLN
Freq: Once | INTRAVENOUS | Status: AC
  Administered 2016-09-22: 17:00:00 via INTRAVENOUS

## 2016-09-22 MED ORDER — POTASSIUM CHLORIDE CRYS ER 20 MEQ PO TBCR
60.0000 meq | EXTENDED_RELEASE_TABLET | Freq: Once | ORAL | Status: AC
  Administered 2016-09-22: 60 meq via ORAL
  Filled 2016-09-22: qty 3

## 2016-09-22 MED ORDER — SODIUM CHLORIDE 0.9 % IV BOLUS (SEPSIS)
1000.0000 mL | Freq: Once | INTRAVENOUS | Status: AC
  Administered 2016-09-22: 1000 mL via INTRAVENOUS

## 2016-09-22 MED ORDER — TECHNETIUM TC 99M-LABELED RED BLOOD CELLS IV KIT
25.1000 | PACK | Freq: Once | INTRAVENOUS | Status: AC | PRN
  Administered 2016-09-22: 25.1 via INTRAVENOUS

## 2016-09-22 NOTE — Progress Notes (Signed)
   Patient continues with active BRBPR.  Tagged scan shows active bleeding in rectosigmoid area.   I have discussed with IR and GI. Initial plan was to take to IR for coil embolization but IR team now tied up with code stroke.   Will give 1u FFP and see if we can get bleeding to slow. Repeat hgb pending. Can transfuse RBCs as needed.   Was getting low flow alarms on VAD. We gave 500cc IVF and turned speed down to 8600. MAPs ok.   IR team will reassess after finishing with code stroke.   Plan discussed with VAD coordinator on call as well as bedside nurse.   The patient is critically ill with multiple organ systems failure and requires high complexity decision making for assessment and support, frequent evaluation and titration of therapies, application of advanced monitoring technologies and extensive interpretation of multiple databases.   Critical Care Time devoted to patient care services described in this note is 35 Minutes.   Jennilyn Esteve,MD 4:27 PM

## 2016-09-22 NOTE — Progress Notes (Signed)
HeartMate 2 Rounding Note  Subjective:     Sitting in chair. Feels better. No further nausea but does have some lower ab pain. Continue with BRBPR which occur with BMs or without. BP and hgb stable.   LVAD INTERROGATION:  HeartMate II LVAD:  Flow 3.0  liters/min, speed 8800, power 4.1, PI 6.5.  Multiple low flow alarms  Objective:    Vital Signs:   Temp:  [97.8 F (36.6 C)-99 F (37.2 C)] 97.8 F (36.6 C) (01/21 0712) Pulse Rate:  [79-94] 80 (01/21 0712) Resp:  [13-24] 22 (01/21 0712) BP: (96-135)/(78-87) 112/85 (01/21 0712) SpO2:  [94 %-100 %] 99 % (01/21 0712) Weight:  [76.2 kg (167 lb 15.9 oz)] 76.2 kg (167 lb 15.9 oz) (01/21 0400) Last BM Date: 09/22/16 Mean arterial Pressure 80s  Intake/Output:   Intake/Output Summary (Last 24 hours) at 09/22/16 1118 Last data filed at 09/21/16 1800  Gross per 24 hour  Intake             1120 ml  Output                0 ml  Net             1120 ml     Physical Exam: General:  Sitting in chair Well appearing. No resp difficulty HEENT: normal Neck: supple. JVP 6. Carotids 2+ bilat; no bruits. No lymphadenopathy or thryomegaly appreciated. Cor: Mechanical heart sounds with LVAD hum present. Lungs: clear Abdomen: soft, nontender, nondistended. No hepatosplenomegaly. No bruits or masses. Good bowel sounds. Driveline: C/D/I; securement device intact and driveline incorporated Extremities: no cyanosis, clubbing, rash, edema Neuro: alert & orientedx3, cranial nerves grossly intact. moves all 4 extremities w/o difficulty. Affect pleasant  Telemetry: NSR  Labs: Basic Metabolic Panel:  Recent Labs Lab 09/21/16 0949 09/22/16 0305  NA 131* 137  K 3.6 3.1*  CL 103 112*  CO2 20* 18*  GLUCOSE 95 81  BUN 17 11  CREATININE 1.44* 0.97  CALCIUM 9.1 8.4*    Liver Function Tests:  Recent Labs Lab 09/21/16 0949  AST 28  ALT 22  ALKPHOS 84  BILITOT 0.6  PROT 8.4*  ALBUMIN 3.2*   No results for input(s): LIPASE, AMYLASE in  the last 168 hours. No results for input(s): AMMONIA in the last 168 hours.  CBC:  Recent Labs Lab 09/21/16 0949 09/22/16 0305  WBC 7.2 5.1  NEUTROABS 4.1 2.5  HGB 12.9 11.4*  HCT 39.5 35.9*  MCV 91.4 91.6  PLT 177 155    INR:  Recent Labs Lab 09/18/16 09/21/16 0949 09/22/16 0305  INR 2.6 2.53 3.04    Other results:  EKG:   Imaging:  No results found.   Medications:     Scheduled Medications: . amiodarone  100 mg Oral Daily  . carvedilol  3.125 mg Oral BID WC  . gabapentin  100 mg Oral QHS  . hydrALAZINE  50 mg Oral TID  . losartan  100 mg Oral Daily  . pantoprazole  40 mg Oral Daily  . potassium chloride SA  20 mEq Oral BID  . sodium chloride  1,000 mL Intravenous Once  . Warfarin - Pharmacist Dosing Inpatient   Does not apply q1800     Infusions:   PRN Medications:     Assessment/plan:   1. Nausea/vomiting - likely viral gastroenteritis. Multiple low flow alarms on VAD    --admitted with volume depletion. Improved with IVF but still with low flow alarms. Will give  one more liter NS. Echo tomorrow.  2. Lower GI bleeding    --continues with frequent lower GI bleeding. FOBT+    --suspect diverticular. Colonscopy from 2011 reviewed and showed hemorrhoids and moderate sigmoid diverticulosis. Hgb stable from baseline (was hemo concentrated yesterday). I discussed with GI who recommended tagged GI scan. Will order today.     --hold warfarin. Continue PPI. Has active T&S    --recheck CBS this afternoon 3. Chronic systolic HF s/p HM-II VAD    --VAD interrogated personally. Multiple low flow alarms. Continue to hydrate 4. HTN     --BP ok. Have cut anti-HTN meds down a bit. Can titrate back up as needed.  5. AKI    --due to volume depletion. Resolved with hydration  Length of Stay: 1  Ailanie Ruttan MD 09/22/2016, 11:18 AM  VAD Team --- VAD ISSUES ONLY--- Pager [phone] (7am - 7am)  Advanced Heart Failure Team  Pager [phone] (M-F; 7a -  4p)  Please contact [facility] for night-coverage after hours (4p -7a ) and weekends on [email or web address]

## 2016-09-22 NOTE — Progress Notes (Signed)
Low flow alarmed while RN at the bedside. [name] called. Orders give for 500cc NS bolus, and to decrease pump speed to 8600. VSS with MAP of  100-13mmHg, HR 80's. VAD numbers at time of alarm: Flow 2.4, speed 8800, power 3.8, PI 6 Current VAD numbers:Flow 2.9. Speed 8600, power 4, PI 6.4  Will continue to monitor and assess pt closely.

## 2016-09-22 NOTE — Progress Notes (Signed)
ANTICOAGULATION CONSULT NOTE   Pharmacy Consult for warfarin Indication: LVAD  No Known Allergies  Patient Measurements: Height: 5' (152.4 cm) Weight: 167 lb 15.9 oz (76.2 kg) IBW/kg (Calculated) : 45.5   Vital Signs: Temp: 97.8 F (36.6 C) (01/21 0712) Temp Source: Oral (01/21 0712) BP: 112/85 (01/21 0712) Pulse Rate: 80 (01/21 0712)  Labs:  Recent Labs  09/21/16 0949 09/22/16 0305  HGB 12.9 11.4*  HCT 39.5 35.9*  PLT 177 155  LABPROT 27.7* 32.1*  INR 2.53 3.04  CREATININE 1.44* 0.97    Estimated Creatinine Clearance: 42.9 mL/min (by C-G formula based on SCr of 0.97 mg/dL).   Medical History: Past Medical History:  Diagnosis Date  . AICD (automatic cardioverter/defibrillator) present   . Anemia   . Atrial fibrillation or flutter   . Cardiac arrest - ventricular fibrillation 12/10   with successful resucitation, S/p ICD  . CHF (congestive heart failure) ([place])   . Diverticula of colon 2011  . HTN (hypertension)    moderate  . ICD (implantable cardiac defibrillator) in place    she has received appropriate therapy for VF  . Internal and external hemorrhoids without complication [id number]  . Nonischemic cardiomyopathy ([place])    followed by Dr April Holding at [facility]  . Osteopenia   . Plasma cell disorder 03/20/2012  . Plasma cell disorder 03/20/2012  . Sessile colonic polyp 2011   Dr Benson Norway   Assessment: 82 year old female with a PMH of HF due to severe NICM, chronic systolic HF, PAF, plasma cell disorder (Likely IgA MGUS) - followed by Dr. [name], She is s/p implantation of the HeartMate II LVAD on 04/06/13 for DT.  Patient is on chronic coumadin for her LVAD, INR is at upper limit of goal at 3 this morning. Dose was reduced yesterday but still saw significant trend up this morning likely due to limited po intake. Still noted to have BRBPR per nursing this am, cards is discussing case with GI. Will hold warfarin today in hopes of INR drifting down.   Goal of Therapy:   INR 2-3 (goal ~2 this admit) Monitor platelets by anticoagulation protocol: Yes   Plan:  Hold warfarin tonight Daily INR while in hospital  Erin Hearing PharmD., BCPS Clinical Pharmacist Pager [phone] 09/22/2016 11:09 AM

## 2016-09-22 NOTE — Progress Notes (Signed)
Patient sitting in chair visiting with spouse and talking on phone when low flow alarm sounded. Monitor showed flow rate of 2.6. Patient stated she hasn't eaten all day and she never received her diner tray. Patient provided chicken broth and applesauce with water to drink.  Patient given a 52ml NS as stated earlier by [name] for low flow alarms.  Will continue to monitor closely.

## 2016-09-22 NOTE — Consult Note (Signed)
HPI :  82 y/o female with history of severe NICM, PAF, suspected MGUS, with LVAD in place on Coumadin, who presented with nausea / vomiting and passing blood per rectum. She reports first episode occurred on Thursday, small amount, and has progressively increased with more frequent episodes since that time. Today passing mostly bright red blood per rectum without stool. She had a colonoscopy in 2011 showing high burden of diverticulum in the left colon, also in other areas. She had one small descending colon adenoma removed at that time as well.   She had a tagged RBC scan today showing active bleeding in the rectosigmoid colon. She denies any abdominal pain. She feels slightly dizzi.   Past Medical History:  Diagnosis Date  . AICD (automatic cardioverter/defibrillator) present   . Anemia   . Atrial fibrillation or flutter   . Cardiac arrest - ventricular fibrillation 12/10   with successful resucitation, S/p ICD  . CHF (congestive heart failure) ([place])   . Diverticula of colon 2011  . HTN (hypertension)    moderate  . ICD (implantable cardiac defibrillator) in place    she has received appropriate therapy for VF  . Internal and external hemorrhoids without complication [id number]  . Nonischemic cardiomyopathy ([place])    followed by Dr April Holding at [facility]  . Osteopenia   . Plasma cell disorder 03/20/2012  . Plasma cell disorder 03/20/2012  . Sessile colonic polyp 2011   Dr Benson Norway     Past Surgical History:  Procedure Laterality Date  . ABDOMINAL HYSTERECTOMY    . CARDIAC CATHETERIZATION    . CARDIAC DEFIBRILLATOR PLACEMENT     by Greggory Brandy for secondary prevention of sudden death  . CHOLECYSTECTOMY N/A 11/05/2015   Procedure: LAPAROSCOPIC CHOLECYSTECTOMY;  Surgeon: Rolm Bookbinder, MD;  Location: [place];  Service: General;  Laterality: N/A;  . COLONOSCOPY W/ POLYPECTOMY  2011   Dr Benson Norway  . INSERTION OF IMPLANTABLE LEFT VENTRICULAR ASSIST DEVICE N/A 04/06/2013   Procedure: INSERTION OF IMPLANTABLE  LEFT VENTRICULAR ASSIST DEVICE;  Surgeon: Gaye Pollack, MD;  Location: [place];  Service: Open Heart Surgery;  Laterality: N/A;  . INTRAOPERATIVE TRANSESOPHAGEAL ECHOCARDIOGRAM N/A 04/06/2013   Procedure: INTRAOPERATIVE TRANSESOPHAGEAL ECHOCARDIOGRAM;  Surgeon: Gaye Pollack, MD;  Location: [place] [facility];  Service: Open Heart Surgery;  Laterality: N/A;  . PLACEMENT OF CENTRIMAG VENTRICULAR ASSIST DEVICE Right 04/06/2013   Procedure: PLACEMENT OF CENTRIMAG VENTRICULAR ASSIST DEVICE;  Surgeon: Gaye Pollack, MD;  Location: [place];  Service: Open Heart Surgery;  Laterality: Right;   Family History  Problem Relation Age of Onset  . Stroke Mother   . Stroke Brother    Social History  Substance Use Topics  . Smoking status: Never Smoker  . Smokeless tobacco: Never Used     Comment: remote history of tobacco abuse  . Alcohol use Yes     Comment: occsionally wine   Current Facility-Administered Medications  Medication Dose Route Frequency Provider Last Rate Last Dose  . amiodarone (PACERONE) tablet 100 mg  100 mg Oral Daily Jolaine Artist, MD   100 mg at 09/22/16 0906  . carvedilol (COREG) tablet 3.125 mg  3.125 mg Oral BID WC Jolaine Artist, MD   3.125 mg at 09/22/16 0900  . gabapentin (NEURONTIN) capsule 100 mg  100 mg Oral QHS Jolaine Artist, MD   100 mg at 09/21/16 2150  . hydrALAZINE (APRESOLINE) tablet 50 mg  50 mg Oral TID Jolaine Artist, MD   50 mg  at 09/22/16 1548  . losartan (COZAAR) tablet 100 mg  100 mg Oral Daily Jolaine Artist, MD   100 mg at 09/22/16 0906  . pantoprazole (PROTONIX) EC tablet 40 mg  40 mg Oral Daily Jolaine Artist, MD   40 mg at 09/22/16 0906  . potassium chloride SA (K-DUR,KLOR-CON) CR tablet 20 mEq  20 mEq Oral BID Jolaine Artist, MD   20 mEq at 09/22/16 0906  . Warfarin - Pharmacist Dosing Inpatient   Does not apply [place]       No Known Allergies   Review of Systems: All systems reviewed and negative except where noted  in HPI.    Nm Gi Blood Loss  Result Date: 09/22/2016 CLINICAL DATA:  Lower GI bleed. EXAM: NUCLEAR MEDICINE GASTROINTESTINAL BLEEDING SCAN TECHNIQUE: Sequential abdominal images were obtained following intravenous administration of Tc-57m labeled red blood cells. RADIOPHARMACEUTICALS:  25.1 mCi Tc-39m in-vitro labeled red cells. COMPARISON:  None. FINDINGS: Progressively increasing pool of radiotracer in the pelvis in the region of the rectosigmoid colon most concerning for active gastrointestinal bleeding. Normal physiologic biodistribution of the radiotracer is demonstrated throughout the abdomen. IMPRESSION: Progressively increasing pool of radiotracer in the pelvis in the region of the rectosigmoid colon most concerning for active gastrointestinal bleeding. Critical Value/emergent results were called by telephone at the time of interpretation on 09/22/2016 at 3:13 pm to Dr. Glori Bickers , who verbally acknowledged these results. Electronically Signed   By: Kathreen Devoid   On: 09/22/2016 15:22   CBC Latest Ref Rng & Units 09/22/2016 09/21/2016 08/06/2016  WBC 4.0 - 10.5 K/uL 5.1 7.2 5.4  Hemoglobin 12.0 - 15.0 g/dL 11.4(L) 12.9 11.2(L)  Hematocrit 36.0 - 46.0 % 35.9(L) 39.5 35.4(L)  Platelets 150 - 400 K/uL 155 177 201     Physical Exam: BP (!) 117/94 (BP Location: Right Arm)   Pulse 77   Temp 97.3 F (36.3 C) (Oral)   Resp (!) 22   Ht 5' (1.524 m)   Wt 167 lb 15.9 oz (76.2 kg)   SpO2 99%   BMI 32.81 kg/m  Constitutional: Pleasant,well-developed, female in no acute distress. HEENT: Normocephalic and atraumatic. Conjunctivae are normal. No scleral icterus. Neck supple.  Cardiovascular: Normal rate,   Pulmonary/chest: Effort normal and breath sounds normal.  Abdominal: Soft, nondistended, nontender.  No masses Extremities: no edema Lymphadenopathy: No cervical adenopathy noted. Neurological: Alert and oriented to person place and time. Skin: Skin is warm and dry. No rashes  noted. Psychiatric: Normal mood and affect. Behavior is normal.   ASSESSMENT AND PLAN: 82 y/o female with history of CHF with LVAD in place presenting with acute lower GI bleed. Prior colonoscopy showed extensive diverticulosis in the left colon, and tagged RBC scan is positive for bleeding in the left colon. I suspect it is very likely she is having a diverticular bleed based on these findings. Thus far Hgb has been stable although her bleeding has picked up today.  I think IR embolization would be the most effective way for hemostasis of this bleeding. Use of endoscopy in this setting would be to rule out another etiology which seems unlikely given her last colonoscopy report. Hemostasis with colonoscopy is much less likely to be effective. I will discuss her case with IR. If elevated INR at 3 is prohibitive for embolization at this time, it's possible this bleeding may resolve with a lower INR. Recommend lowest INR acceptable in range for LVAD patient.  We will  follow with you, please call with questions.  [place] Cellar, MD [facility] Pager [phone]

## 2016-09-23 DIAGNOSIS — D649 Anemia, unspecified: Secondary | ICD-10-CM

## 2016-09-23 DIAGNOSIS — K5731 Diverticulosis of large intestine without perforation or abscess with bleeding: Secondary | ICD-10-CM

## 2016-09-23 DIAGNOSIS — D689 Coagulation defect, unspecified: Secondary | ICD-10-CM

## 2016-09-23 DIAGNOSIS — D62 Acute posthemorrhagic anemia: Secondary | ICD-10-CM

## 2016-09-23 LAB — BASIC METABOLIC PANEL
Anion gap: 4 — ABNORMAL LOW (ref 5–15)
BUN: <5 mg/dL — ABNORMAL LOW (ref 6–20)
CO2: 18 mmol/L — ABNORMAL LOW (ref 22–32)
Calcium: 8.2 mg/dL — ABNORMAL LOW (ref 8.9–10.3)
Chloride: 116 mmol/L — ABNORMAL HIGH (ref 101–111)
Creatinine, Ser: 0.87 mg/dL (ref 0.44–1.00)
GFR calc Af Amer: >60 mL/min (ref >60)
GFR calc non Af Amer: >60 mL/min (ref >60)
Glucose, Bld: 83 mg/dL (ref 65–99)
Potassium: 4.1 mmol/L (ref 3.5–5.1)
Sodium: 138 mmol/L (ref 135–145)

## 2016-09-23 LAB — CBC WITH DIFFERENTIAL/PLATELET
Basophils Absolute: 0 10*3/uL (ref 0.0–0.1)
Basophils Relative: 0 %
Eosinophils Absolute: 0.2 10*3/uL (ref 0.0–0.7)
Eosinophils Relative: 4 %
HCT: 31.1 % — ABNORMAL LOW (ref 36.0–46.0)
Hemoglobin: 10.1 g/dL — ABNORMAL LOW (ref 12.0–15.0)
Lymphocytes Relative: 36 %
Lymphs Abs: 1.7 10*3/uL (ref 0.7–4.0)
MCH: 29.8 pg (ref 26.0–34.0)
MCHC: 32.5 g/dL (ref 30.0–36.0)
MCV: 91.7 fL (ref 78.0–100.0)
Monocytes Absolute: 0.4 10*3/uL (ref 0.1–1.0)
Monocytes Relative: 9 %
Neutro Abs: 2.4 10*3/uL (ref 1.7–7.7)
Neutrophils Relative %: 51 %
Platelets: 147 10*3/uL — ABNORMAL LOW (ref 150–400)
RBC: 3.39 MIL/uL — ABNORMAL LOW (ref 3.87–5.11)
RDW: 16 % — ABNORMAL HIGH (ref 11.5–15.5)
WBC: 4.8 10*3/uL (ref 4.0–10.5)

## 2016-09-23 LAB — CBC
HCT: 32.2 % — ABNORMAL LOW (ref 36.0–46.0)
Hemoglobin: 10.3 g/dL — ABNORMAL LOW (ref 12.0–15.0)
MCH: 29.3 pg (ref 26.0–34.0)
MCHC: 32 g/dL (ref 30.0–36.0)
MCV: 91.7 fL (ref 78.0–100.0)
Platelets: 155 10*3/uL (ref 150–400)
RBC: 3.51 MIL/uL — ABNORMAL LOW (ref 3.87–5.11)
RDW: 15.7 % — ABNORMAL HIGH (ref 11.5–15.5)
WBC: 4.6 10*3/uL (ref 4.0–10.5)

## 2016-09-23 LAB — PROTIME-INR
INR: 2.65
Prothrombin Time: 28.7 seconds — ABNORMAL HIGH (ref 11.4–15.2)

## 2016-09-23 LAB — PREPARE FRESH FROZEN PLASMA
Blood Product Expiration Date: 201801242359
ISSUE DATE / TIME: 201801211705
Unit Type and Rh: 6200

## 2016-09-23 LAB — TYPE AND SCREEN
ABO/RH(D): O POS
ABO/RH(D): O POS
Antibody Screen: NEGATIVE
Antibody Screen: NEGATIVE

## 2016-09-23 LAB — LACTATE DEHYDROGENASE: LDH: 173 U/L (ref 98–192)

## 2016-09-23 MED ORDER — SODIUM CHLORIDE 0.9 % IV SOLN
Freq: Once | INTRAVENOUS | Status: AC
  Administered 2016-09-23: 10:00:00 via INTRAVENOUS

## 2016-09-23 MED ORDER — SODIUM CHLORIDE 0.9 % IV BOLUS (SEPSIS)
500.0000 mL | Freq: Once | INTRAVENOUS | Status: AC
  Administered 2016-09-23: 500 mL via INTRAVENOUS

## 2016-09-23 MED ORDER — SODIUM CHLORIDE 0.9 % IV SOLN
Freq: Once | INTRAVENOUS | Status: AC
  Administered 2016-09-23: 13:00:00 via INTRAVENOUS

## 2016-09-23 MED ORDER — HYDRALAZINE HCL 50 MG PO TABS
75.0000 mg | ORAL_TABLET | Freq: Three times a day (TID) | ORAL | Status: DC
  Administered 2016-09-23 – 2016-09-30 (×23): 75 mg via ORAL
  Filled 2016-09-23 (×23): qty 1

## 2016-09-23 NOTE — Progress Notes (Signed)
ANTICOAGULATION CONSULT NOTE - Follow Up Consult  Pharmacy Consult for Coumadin Indication: LVAD  No Known Allergies  Patient Measurements: Height: 5' (152.4 cm) Weight: 169 lb 12.8 oz (77 kg) IBW/kg (Calculated) : 45.5  Vital Signs: Temp: 98 F (36.7 C) (01/22 0812) Temp Source: Oral (01/22 0812) BP: 119/83 (01/22 0812) Pulse Rate: 80 (01/22 0812)  Labs:  Recent Labs  09/21/16 0949 09/22/16 0305 09/22/16 1558 09/23/16 0309  HGB 12.9 11.4* 12.2 10.1*  HCT 39.5 35.9* 38.0 31.1*  PLT 177 155 180 147*  LABPROT 27.7* 32.1*  --  28.7*  INR 2.53 3.04  --  2.65  CREATININE 1.44* 0.97  --  0.87    Estimated Creatinine Clearance: 48.1 mL/min (by C-G formula based on SCr of 0.87 mg/dL).  Assessment: 81yof on coumadin pta for her LVAD, admitted with GIB. INR 2.53 on admit and coumadin initially resumed. INR up to 3.04 yesterday, dose held, and she received FFP. Now she may need IR intervention so holding coumadin indefinitely to allow INR to drift down. Hgb down to 10.1, plan to transfuse if < 8.5.  Home dose: 5mg  daily - last taken 1/19  Goal of Therapy:  INR 2-3 (goal ~ 2 this admit) Monitor platelets by anticoagulation protocol: Yes   Plan:  1) Hold coumadin, will d/c pharmacy consult for now 2) Follow up GI/IR plan  Deboraha Sprang 09/23/2016,11:22 AM

## 2016-09-23 NOTE — Progress Notes (Signed)
HeartMate 2 Rounding Note  Subjective:    Tagged RBC bleeding rectosigmoid. Due to active bleeding she received FFP, fluid bolus, and LVAD speed was cut back.   Complaining of dizziness and fatigue. 1 bloody BM over night. Denies SOB.    LVAD INTERROGATION:  HeartMate II LVAD:  Flow 3.6  liters/min, speed 8600, power 4.1  PI 7.2.  3 PI events. 15 low flow alarms since MN    Objective:    Vital Signs:   Temp:  [97.3 F (36.3 C)-98.6 F (37 C)] 97.6 F (36.4 C) (01/22 0400) Pulse Rate:  [77-85] 79 (01/22 0400) Resp:  [18-20] 18 (01/21 2331) BP: (82-124)/(69-94) 82/69 (01/22 0400) SpO2:  [96 %-98 %] 97 % (01/22 0400) Weight:  [169 lb 12.8 oz (77 kg)] 169 lb 12.8 oz (77 kg) (01/22 0628) Last BM Date: 09/22/16 Mean arterial Pressure 80-100s  Intake/Output:   Intake/Output Summary (Last 24 hours) at 09/23/16 0754 Last data filed at 09/22/16 2000  Gross per 24 hour  Intake             1463 ml  Output                0 ml  Net             1463 ml     Physical Exam: General:  In bed . NAD HEENT: normal Neck: supple. JVP flat  Carotids 2+ bilat; no bruits. No lymphadenopathy or thryomegaly appreciated. Cor: Mechanical heart sounds with LVAD hum present. Lungs: clear Abdomen: soft, nontender, nondistended. No hepatosplenomegaly. No bruits or masses. Good bowel sounds. Driveline: C/D/I; securement device intact and driveline incorporated Extremities: no cyanosis, clubbing, rash, edema Neuro: alert & orientedx3, cranial nerves grossly intact. moves all 4 extremities w/o difficulty. Affect pleasant  Telemetry: NSR  Labs: Basic Metabolic Panel:  Recent Labs Lab 09/21/16 0949 09/22/16 0305 09/23/16 0309  NA 131* 137 138  K 3.6 3.1* 4.1  CL 103 112* 116*  CO2 20* 18* 18*  GLUCOSE 95 81 83  BUN 17 11 <5*  CREATININE 1.44* 0.97 0.87  CALCIUM 9.1 8.4* 8.2*    Liver Function Tests:  Recent Labs Lab 09/21/16 0949  AST 28  ALT 22  ALKPHOS 84  BILITOT 0.6    PROT 8.4*  ALBUMIN 3.2*   No results for input(s): LIPASE, AMYLASE in the last 168 hours. No results for input(s): AMMONIA in the last 168 hours.  CBC:  Recent Labs Lab 09/21/16 0949 09/22/16 0305 09/22/16 1558 09/23/16 0309  WBC 7.2 5.1 5.0 4.8  NEUTROABS 4.1 2.5  --  2.4  HGB 12.9 11.4* 12.2 10.1*  HCT 39.5 35.9* 38.0 31.1*  MCV 91.4 91.6 91.3 91.7  PLT 177 155 180 147*    INR:  Recent Labs Lab 09/18/16 09/21/16 0949 09/22/16 0305 09/23/16 0309  INR 2.6 2.53 3.04 2.65    Other results:  EKG:   Imaging: Nm Gi Blood Loss  Result Date: 09/22/2016 CLINICAL DATA:  Lower GI bleed. EXAM: NUCLEAR MEDICINE GASTROINTESTINAL BLEEDING SCAN TECHNIQUE: Sequential abdominal images were obtained following intravenous administration of Tc-48m labeled red blood cells. RADIOPHARMACEUTICALS:  25.1 mCi Tc-69m in-vitro labeled red cells. COMPARISON:  None. FINDINGS: Progressively increasing pool of radiotracer in the pelvis in the region of the rectosigmoid colon most concerning for active gastrointestinal bleeding. Normal physiologic biodistribution of the radiotracer is demonstrated throughout the abdomen. IMPRESSION: Progressively increasing pool of radiotracer in the pelvis in the region of the rectosigmoid colon most  concerning for active gastrointestinal bleeding. Critical Value/emergent results were called by telephone at the time of interpretation on 09/22/2016 at 3:13 pm to Dr. Glori Bickers , who verbally acknowledged these results. Electronically Signed   By: Kathreen Devoid   On: 09/22/2016 15:22     Medications:     Scheduled Medications: . amiodarone  100 mg Oral Daily  . carvedilol  3.125 mg Oral BID WC  . gabapentin  100 mg Oral QHS  . hydrALAZINE  50 mg Oral TID  . losartan  100 mg Oral Daily  . pantoprazole  40 mg Oral Daily  . potassium chloride SA  20 mEq Oral BID  . Warfarin - Pharmacist Dosing Inpatient   Does not apply q1800    Infusions:   PRN  Medications:     Assessment/plan:   1. Nausea/vomiting - likely viral gastroenteritis. Multiple low flow alarms on VAD    --admitted with volume depletion.  2. Lower GI bleeding    --continues with frequent lower GI bleeding. I bloody BM over night.  Tagged scan with active bleed in rectosigmoid. Bleeding slowed with FFP. INR 2.65   Hgb trending down 12.2>10. Will need another T&S. Lost band in IR. Likely transfuse 1 unit PRBC.     --hold warfarin. Continue PPI.  GI appreciated.  3. Chronic systolic HF s/p HM-II VAD    --VAD interrogated personally. 15 low flows since MN.  4. HTN     --Maps trending up. Increase hydralazine 75 mg tid. Continue losartan 100 mg daily.  5. AKI    --Renal function stable.   Length of Stay: 2  Amy Clegg NP-C  09/23/2016, 7:54 AM  VAD Team --- VAD ISSUES ONLY--- Pager [phone] (7am - 7am)  Advanced Heart Failure Team  Pager [phone] (M-F; 7a - 4p)  Please contact [facility] for night-coverage after hours (4p -7a ) and weekends on [email or web address]  Patient seen and examined with Darrick Grinder, NP. We discussed all aspects of the encounter. I agree with the assessment and plan as stated above.   She has active diverticular bleed. Bleeding has slowed after 1u FFP. INR 2.6. Hgb down a bit but not too bad. Feels lightheaded. Will give 500cc NS. Repeat HGB at 12 noon. If continues to have more episodes of bleeding and/or hgb dropping will pursue IR intervention. Hopefully we can avoid. Hold coumadin today. Would not give any more FFP with VAD at this point. VAD parameters improved on lower speed. Would not treat HTN aggressively in setting of active bleed. Transfuse if HGB < 8.5.   Bless Belshe,MD 9:20 AM

## 2016-09-23 NOTE — Progress Notes (Addendum)
Patient had a small amount of bright red  bleeding from rectum when patient went to the bathroom. Vitals stable. MD aware. Will continue to monitor.

## 2016-09-23 NOTE — Progress Notes (Signed)
Patient Brianna Hanson in bathroom getting cleaned up when LVAD alarmed Low Flow. Flow reading 2.3. Patient denied any dizziness, just stated she felt very tired. Assisted patient back to bed and map was 82. No more alarms for the night. Patient asked for a cup of water to drink. Water given to patient. Will continue to monitor closely.

## 2016-09-23 NOTE — H&P (Signed)
Chief Complaint: Patient was seen in consultation today for arterial embolization for large amount rectal bleeding.   Referring Physician(s): Armbruster, S.   Supervising Physician: Jacqulynn Cadet  Patient Status: [facility] - In-pt  History of Present Illness: Brianna Hanson is a 82 y.o. female referred to the IR service for rectal bleeding and possible plans for visceral arteriography/ embolization. She states her last BM was 9 AM this morning and described as bright red. She denies chest pain, shortness of breath, fevers, chills, headaches, back pain, abd pain, nausea, or vomiting.   Past Medical History:  Diagnosis Date  . AICD (automatic cardioverter/defibrillator) present   . Anemia   . Atrial fibrillation or flutter   . Cardiac arrest - ventricular fibrillation 12/10   with successful resucitation, S/p ICD  . CHF (congestive heart failure) ([place])   . Diverticula of colon 2011  . HTN (hypertension)    moderate  . ICD (implantable cardiac defibrillator) in place    she has received appropriate therapy for VF  . Internal and external hemorrhoids without complication [id number]  . Nonischemic cardiomyopathy ([place])    followed by Dr April Holding at [facility]  . Osteopenia   . Plasma cell disorder 03/20/2012  . Plasma cell disorder 03/20/2012  . Sessile colonic polyp 2011   Dr Benson Norway    Past Surgical History:  Procedure Laterality Date  . ABDOMINAL HYSTERECTOMY    . CARDIAC CATHETERIZATION    . CARDIAC DEFIBRILLATOR PLACEMENT     by Greggory Brandy for secondary prevention of sudden death  . CHOLECYSTECTOMY N/A October 19, 2015   Procedure: LAPAROSCOPIC CHOLECYSTECTOMY;  Surgeon: Rolm Bookbinder, MD;  Location: [place];  Service: General;  Laterality: N/A;  . COLONOSCOPY W/ POLYPECTOMY  2011   Dr Benson Norway  . INSERTION OF IMPLANTABLE LEFT VENTRICULAR ASSIST DEVICE N/A 04/06/2013   Procedure: INSERTION OF IMPLANTABLE LEFT VENTRICULAR ASSIST DEVICE;  Surgeon: Gaye Pollack, MD;  Location: [place];  Service: Open  Heart Surgery;  Laterality: N/A;  . INTRAOPERATIVE TRANSESOPHAGEAL ECHOCARDIOGRAM N/A 04/06/2013   Procedure: INTRAOPERATIVE TRANSESOPHAGEAL ECHOCARDIOGRAM;  Surgeon: Gaye Pollack, MD;  Location: [place] [facility];  Service: Open Heart Surgery;  Laterality: N/A;  . PLACEMENT OF CENTRIMAG VENTRICULAR ASSIST DEVICE Right 04/06/2013   Procedure: PLACEMENT OF CENTRIMAG VENTRICULAR ASSIST DEVICE;  Surgeon: Gaye Pollack, MD;  Location: [place];  Service: Open Heart Surgery;  Laterality: Right;    Allergies: Patient has no known allergies.  Medications: Prior to Admission medications   Medication Sig Start Date End Date Taking? Authorizing Provider  amiodarone (PACERONE) 200 MG tablet Take 0.5 tablets (100 mg total) by mouth daily. 10/19/15  Yes Jolaine Artist, MD  carvedilol (COREG) 6.25 MG tablet Take 1 tablet (6.25 mg total) by mouth 2 (two) times daily with a meal. 01/01/16  Yes Jolaine Artist, MD  Cholecalciferol (VITAMIN D3) 1000 UNITS CAPS Take 1 tablet by mouth daily.     Yes Historical Provider, MD  docusate sodium (COLACE) 100 MG capsule Take 1 capsule (100 mg total) by mouth 2 (two) times daily. Patient taking differently: Take 100 mg by mouth daily.  07/11/15  Yes Jolaine Artist, MD  ferrous sulfate 325 (65 FE) MG tablet Take 1 tablet (325 mg total) by mouth 2 (two) times daily with a meal. 07/11/15  Yes Jolaine Artist, MD  gabapentin (NEURONTIN) 100 MG capsule TAKE ONE CAPSULE BY MOUTH AT BEDTIME OR AS DIRECTED 09/12/16  Yes Jolaine Artist, MD  hydrALAZINE (APRESOLINE) 50 MG  tablet Take 1.5 tablets (75 mg total) by mouth 3 (three) times daily. 08/08/16 11/06/16 Yes Shaune Pascal [name], MD  losartan (COZAAR) 50 MG tablet Take 2 tablets (100 mg total) by mouth daily. Patient taking differently: Take 100 mg by mouth every evening.  06/25/16  Yes Shaune Pascal [name], MD  pantoprazole (PROTONIX) 40 MG tablet TAKE 1 TABLET BY MOUTH EVERY DAY 02/15/16  Yes Jolaine Artist, MD  potassium chloride  SA (K-DUR,KLOR-CON) 20 MEQ tablet Take 20 mEq by mouth 2 (two) times daily.   Yes Historical Provider, MD  warfarin (COUMADIN) 2.5 MG tablet Take 5 mg by mouth daily.   Yes Historical Provider, MD  traMADol (ULTRAM) 50 MG tablet Take 1 tablet (50 mg total) by mouth every 12 (twelve) hours as needed for moderate pain. 07/01/16   Jolaine Artist, MD     Family History  Problem Relation Age of Onset  . Stroke Mother   . Stroke Brother     Social History   Social History  . Marital status: Single    Spouse name: N/A  . Number of children: 3  . Years of education: N/A   Social History Main Topics  . Smoking status: Never Smoker  . Smokeless tobacco: Never Used     Comment: remote history of tobacco abuse  . Alcohol use Yes     Comment: occsionally wine  . Drug use: No  . Sexual activity: Not Currently    Birth control/ protection: Surgical   Other Topics Concern  . None   Social History Narrative   Lives with spouse who was recently diagnosed with esophageal ca and is receiving chemotherapy      Review of Systems See HPI above Vital Signs: BP 115/86   Pulse 85   Temp 98.6 F (37 C) (Oral)   Resp 15   Ht 5' (1.524 m)   Wt 169 lb 12.8 oz (77 kg)   SpO2 98%   BMI 33.16 kg/m   Physical Exam Awake and alert. Hum consistent with LVAD. Heart rate is regular. Lungs are clear to ascultation bilaterally. Abdomen is soft, nontender, and nondistended. Bowel sounds intact. No edema on extremities.   Imaging: Nm Gi Blood Loss  Result Date: 09/22/2016 CLINICAL DATA:  Lower GI bleed. EXAM: NUCLEAR MEDICINE GASTROINTESTINAL BLEEDING SCAN TECHNIQUE: Sequential abdominal images were obtained following intravenous administration of Tc-43m labeled red blood cells. RADIOPHARMACEUTICALS:  25.1 mCi Tc-34m in-vitro labeled red cells. COMPARISON:  None. FINDINGS: Progressively increasing pool of radiotracer in the pelvis in the region of the rectosigmoid colon most concerning for active  gastrointestinal bleeding. Normal physiologic biodistribution of the radiotracer is demonstrated throughout the abdomen. IMPRESSION: Progressively increasing pool of radiotracer in the pelvis in the region of the rectosigmoid colon most concerning for active gastrointestinal bleeding. Critical Value/emergent results were called by telephone at the time of interpretation on 09/22/2016 at 3:13 pm to Dr. Glori Bickers , who verbally acknowledged these results. Electronically Signed   By: Kathreen Devoid   On: 09/22/2016 15:22    Labs:  CBC:  Recent Labs  09/22/16 0305 09/22/16 1558 09/23/16 0309 09/23/16 1102  WBC 5.1 5.0 4.8 4.6  HGB 11.4* 12.2 10.1* 10.3*  HCT 35.9* 38.0 31.1* 32.2*  PLT 155 180 147* 155    COAGS:  Recent Labs  09/28/15 1112  09/18/16 09/21/16 0949 09/22/16 0305 09/23/16 0309  INR 2.55*  < > 2.6 2.53 3.04 2.65  APTT 42*  --   --   --   --   --   < > =  values in this interval not displayed.  BMP:  Recent Labs  08/06/16 1100 09/21/16 0949 09/22/16 0305 09/23/16 0309  NA 138 131* 137 138  K 4.4 3.6 3.1* 4.1  CL 109 103 112* 116*  CO2 26 20* 18* 18*  GLUCOSE 88 95 81 83  BUN 11 17 11  <5*  CALCIUM 9.0 9.1 8.4* 8.2*  CREATININE 0.85 1.44* 0.97 0.87  GFRNONAA >60 34* 54* >60  GFRAA >60 39* >60 >60    LIVER FUNCTION TESTS:  Recent Labs  10/14/15 0352 02/01/16 1042 08/06/16 1100 09/21/16 0949  BILITOT 0.2* 0.4 0.6 0.6  AST 14* 25 21 28   ALT 15 20 15 22   ALKPHOS 46 73 91 84  PROT 6.4* 7.5 7.8 8.4*  ALBUMIN 2.3* 3.1* 3.2* 3.2*    TUMOR MARKERS: No results for input(s): AFPTM, CEA, CA199, CHROMGRNA in the last 8760 hours.  Assessment and Plan:  Anemia secondary to acute rectal bleeding. Patient is receiving FFP now; INR 2.65. Hemoglobin 10.3 (12.2). WBCs nl. Dr. Laurence Ferrari has reviewed the images and plans to proceed with visceral arteriogram/poss embolization if patient continues to bleed after FFP. Risks and benefits discussed with the  patient/husband including, but not limited to bleeding, infection, damage to adjacent structures or nontarget embolization. All of the patient's questions were answered, patient is agreeable to proceed. Consent signed and in chart.   Thank you for this interesting consult.  I greatly enjoyed meeting Brianna Hanson and look forward to participating in their care.  A copy of this report was sent to the requesting provider on this date.  Electronically Signed: D. Lennette Bihari Chiquitta Matty/Jason Whitaker,PA-S 09/23/2016, 1:36 PM   I spent a total of  30 mins in face to face in clinical consultation, greater than 50% of which was counseling/coordinating care for arterial embolization for rectal bleeding.

## 2016-09-23 NOTE — Progress Notes (Signed)
Daily Rounding Note  09/23/2016, 9:38 AM  LOS: 2 days   SUBJECTIVE:   Chief complaint: Painless hematochezia.  Several episodes yesterday, non overnight, one this AM within the last 2 hours.  Feels weak, normally does not.  No SOB or chest or abd pain.    On clear diet.  IR consult for 1/21 was delayed due to a code stroke.      OBJECTIVE:         Vital signs in last 24 hours:    Temp:  [97.3 F (36.3 C)-98.6 F (37 C)] 98 F (36.7 C) (01/22 0812) Pulse Rate:  [77-90] 80 (01/22 0812) Resp:  [18-20] 18 (01/21 2331) BP: (82-124)/(69-94) 119/83 (01/22 0812) SpO2:  [96 %-98 %] 97 % (01/22 0812) Weight:  [77 kg (169 lb 12.8 oz)] 77 kg (169 lb 12.8 oz) (01/22 0628) Last BM Date: 09/23/16 Filed Weights   09/21/16 0911 09/22/16 0400 09/23/16 0628  Weight: 77.1 kg (170 lb) 76.2 kg (167 lb 15.9 oz) 77 kg (169 lb 12.8 oz)   General: pleasant, looks well.  comfortable   Heart: RRR underlying VAD hum.  ICD in upper left sternal region Chest: clear bil.  No cough or dyspnea Abdomen: soft, NT, active BS, ND.  Benign VAD driveline site on right  Extremities: no CCE Neuro/Psych:  Calm, pleasant, no gross deficits.  Fully alert, engaged and oriented.  Intake/Output from previous day: 01/21 0701 - 01/22 0700 In: 1463 [P.O.:360; Blood:228; IV Piggyback:875] Out: -   Intake/Output this shift: No intake/output data recorded.  Lab Results:  Recent Labs  09/22/16 0305 09/22/16 1558 09/23/16 0309  WBC 5.1 5.0 4.8  HGB 11.4* 12.2 10.1*  HCT 35.9* 38.0 31.1*  PLT 155 180 147*   BMET  Recent Labs  09/21/16 0949 09/22/16 0305 09/23/16 0309  NA 131* 137 138  K 3.6 3.1* 4.1  CL 103 112* 116*  CO2 20* 18* 18*  GLUCOSE 95 81 83  BUN 17 11 <5*  CREATININE 1.44* 0.97 0.87  CALCIUM 9.1 8.4* 8.2*   LFT  Recent Labs  09/21/16 0949  PROT 8.4*  ALBUMIN 3.2*  AST 28  ALT 22  ALKPHOS 84  BILITOT 0.6    PT/INR  Recent Labs  09/22/16 0305 09/23/16 0309  LABPROT 32.1* 28.7*  INR 3.04 2.65   Hepatitis Panel No results for input(s): HEPBSAG, HCVAB, HEPAIGM, HEPBIGM in the last 72 hours.  Studies/Results: Nm Gi Blood Loss  Result Date: 09/22/2016 CLINICAL DATA:  Lower GI bleed. EXAM: NUCLEAR MEDICINE GASTROINTESTINAL BLEEDING SCAN TECHNIQUE: Sequential abdominal images were obtained following intravenous administration of Tc-1m labeled red blood cells. RADIOPHARMACEUTICALS:  25.1 mCi Tc-57m in-vitro labeled red cells. COMPARISON:  None. FINDINGS: Progressively increasing pool of radiotracer in the pelvis in the region of the rectosigmoid colon most concerning for active gastrointestinal bleeding. Normal physiologic biodistribution of the radiotracer is demonstrated throughout the abdomen. IMPRESSION: Progressively increasing pool of radiotracer in the pelvis in the region of the rectosigmoid colon most concerning for active gastrointestinal bleeding. Critical Value/emergent results were called by telephone at the time of interpretation on 09/22/2016 at 3:13 pm to Dr. Glori Bickers , who verbally acknowledged these results. Electronically Signed   By: Kathreen Devoid   On: 09/22/2016 15:22   Scheduled Meds: . sodium chloride   Intravenous Once  . amiodarone  100 mg Oral Daily  . carvedilol  3.125 mg Oral BID WC  . gabapentin  100 mg Oral QHS  . hydrALAZINE  75 mg Oral TID  . losartan  100 mg Oral Daily  . pantoprazole  40 mg Oral Daily  . potassium chloride SA  20 mEq Oral BID  . sodium chloride  500 mL Intravenous Once  . Warfarin - Pharmacist Dosing Inpatient   Does not apply q1800   Continuous Infusions: PRN Meds:.   ASSESMENT:   *  Hematochezia.  In setting of Coumadin. 12/2009 Colonoscopy, Dr Benson Norway: descending adenoma and pan-diverticulosis > in sigmoid, int/ext hemorrhoids.  Bleeding hemorrhoids 07/2015.  Dr Carlean Purl considered hemorrhoidal banding.  09/22/16 Nuc RBC scan:  bleeding in rectosigmoid IR   *  Chronic Coumadin for s/p LVAD 04/2013.  INR 3.0 >> 2.6.  Last dose Coumadin 10/20.  No IV heparin yet in place.   S/p FFP x 1 on 09/23/15.  No Vitamin K given.   *  Blood loss anemia.  hgb down ~ 2.5 gm.  On BID po iron at home.   *  MGUS.   Thrombocytopenia, non-critical, currently.    PLAN   *  Await IR consult.  ? What is the "shutoff" INR which will allow them to proceed to angiography/embolization?   *  Daily AM CBC and another today at noon.    *  ? Allow her to have a [place]?, as no plans for colonoscopy.  Brianna Hanson  09/23/2016, 9:38 AM Pager: [phone]  GI ATTENDING  Interval history data reviewed. Case discussed with Dr. Havery Moros. Agree with interval progress note as outlined. Patient with acute diverticular bleed the face of anticoagulation. IRC contacted the prospects of therapeutic intervention. No additional specific GI recommendations. Cardiology to manage volume status and anti-coagulation parameters. We will follow along  Docia Chuck. Geri Seminole., M.D. [facility] Division of Gastroenterology

## 2016-09-24 ENCOUNTER — Inpatient Hospital Stay (HOSPITAL_COMMUNITY): Payer: Medicare Other

## 2016-09-24 ENCOUNTER — Encounter (HOSPITAL_COMMUNITY): Payer: Self-pay | Admitting: Interventional Radiology

## 2016-09-24 DIAGNOSIS — K5731 Diverticulosis of large intestine without perforation or abscess with bleeding: Principal | ICD-10-CM

## 2016-09-24 DIAGNOSIS — D62 Acute posthemorrhagic anemia: Secondary | ICD-10-CM

## 2016-09-24 HISTORY — PX: IR GENERIC HISTORICAL: IMG1180011

## 2016-09-24 LAB — CBC WITH DIFFERENTIAL/PLATELET
Basophils Absolute: 0 10*3/uL (ref 0.0–0.1)
Basophils Relative: 1 %
Eosinophils Absolute: 0.2 10*3/uL (ref 0.0–0.7)
Eosinophils Relative: 5 %
HCT: 31.1 % — ABNORMAL LOW (ref 36.0–46.0)
Hemoglobin: 10.3 g/dL — ABNORMAL LOW (ref 12.0–15.0)
Lymphocytes Relative: 42 %
Lymphs Abs: 1.9 10*3/uL (ref 0.7–4.0)
MCH: 29.9 pg (ref 26.0–34.0)
MCHC: 33.1 g/dL (ref 30.0–36.0)
MCV: 90.1 fL (ref 78.0–100.0)
Monocytes Absolute: 0.6 10*3/uL (ref 0.1–1.0)
Monocytes Relative: 13 %
Neutro Abs: 1.7 10*3/uL (ref 1.7–7.7)
Neutrophils Relative %: 39 %
Platelets: 153 10*3/uL (ref 150–400)
RBC: 3.45 MIL/uL — ABNORMAL LOW (ref 3.87–5.11)
RDW: 15.5 % (ref 11.5–15.5)
WBC: 4.5 10*3/uL (ref 4.0–10.5)

## 2016-09-24 LAB — PROTIME-INR
INR: 2.94
INR: 2.81
Prothrombin Time: 31.2 s — ABNORMAL HIGH (ref 11.4–15.2)
Prothrombin Time: 30.1 seconds — ABNORMAL HIGH (ref 11.4–15.2)

## 2016-09-24 LAB — PREPARE FRESH FROZEN PLASMA: Unit division: 0

## 2016-09-24 LAB — BASIC METABOLIC PANEL
Anion gap: 5 (ref 5–15)
BUN: <5 mg/dL — ABNORMAL LOW (ref 6–20)
CO2: 20 mmol/L — ABNORMAL LOW (ref 22–32)
Calcium: 8.5 mg/dL — ABNORMAL LOW (ref 8.9–10.3)
Chloride: 115 mmol/L — ABNORMAL HIGH (ref 101–111)
Creatinine, Ser: 0.83 mg/dL (ref 0.44–1.00)
GFR calc Af Amer: >60 mL/min (ref >60)
GFR calc non Af Amer: >60 mL/min (ref >60)
Glucose, Bld: 78 mg/dL (ref 65–99)
Potassium: 3.6 mmol/L (ref 3.5–5.1)
Sodium: 140 mmol/L (ref 135–145)

## 2016-09-24 LAB — LACTATE DEHYDROGENASE: LDH: 170 U/L (ref 98–192)

## 2016-09-24 MED ORDER — MIDAZOLAM HCL 2 MG/2ML IJ SOLN
INTRAMUSCULAR | Status: AC
  Filled 2016-09-24: qty 2

## 2016-09-24 MED ORDER — LIDOCAINE HCL (PF) 1 % IJ SOLN
INTRAMUSCULAR | Status: AC
  Filled 2016-09-24: qty 10

## 2016-09-24 MED ORDER — IOPAMIDOL (ISOVUE-300) INJECTION 61%
INTRAVENOUS | Status: AC
  Administered 2016-09-24: 20 mL
  Filled 2016-09-24: qty 100

## 2016-09-24 MED ORDER — FENTANYL CITRATE (PF) 100 MCG/2ML IJ SOLN
INTRAMUSCULAR | Status: AC
  Filled 2016-09-24: qty 2

## 2016-09-24 MED ORDER — SODIUM CHLORIDE 0.9 % IV BOLUS (SEPSIS)
1000.0000 mL | Freq: Once | INTRAVENOUS | Status: AC
  Administered 2016-09-24: 1000 mL via INTRAVENOUS

## 2016-09-24 MED ORDER — FENTANYL CITRATE (PF) 100 MCG/2ML IJ SOLN
INTRAMUSCULAR | Status: AC | PRN
  Administered 2016-09-24: 25 ug via INTRAVENOUS

## 2016-09-24 MED ORDER — IOPAMIDOL (ISOVUE-300) INJECTION 61%
INTRAVENOUS | Status: AC
  Administered 2016-09-24: 66 mL
  Filled 2016-09-24: qty 150

## 2016-09-24 MED ORDER — LIDOCAINE HCL 1 % IJ SOLN
INTRAMUSCULAR | Status: AC | PRN
  Administered 2016-09-24: 10 mL

## 2016-09-24 NOTE — Care Management Note (Signed)
Case Management Note  Patient Details  Name: [name] MRN: [id number] Date of Birth: 09-18-1934  Subjective/Objective:    LVAD, viral gastroenteritis, Lower GI bleeding, HF                Action/Plan: Discharge Planning: NCM spoke to pt at bedside. States she lives at home with [name] [phone]. States he takes her to her appts and assist at home as needed. She has RW at home. Has [facility] in the past for [facility]. No NCM needs identified. Will continue to follow for dc needs.   PCP  Janith Lima MD  Expected Discharge Date:                  Expected Discharge Plan:  Home/Self Care  In-House Referral:  NA  Discharge planning Services  CM Consult  Post Acute Care Choice:  NA Choice offered to:  NA  DME Arranged:  N/A DME Agency:  NA  HH Arranged:  NA HH Agency:  NA  Status of Service:  Completed, signed off  If discussed at [place] of Stay Meetings, dates discussed:    Additional Comments:  Erenest Rasher, RN 09/24/2016, 3:07 PM

## 2016-09-24 NOTE — Care Management Important Message (Signed)
Important Message  Patient Details  Name: Brianna Hanson MRN: [id number] Date of Birth: [birth year]   Medicare Important Message Given:  Yes    Erenest Rasher, RN 09/24/2016, 3:02 PM

## 2016-09-24 NOTE — Progress Notes (Signed)
VAD Coordinator Procedure Note:   Patient underwent mesenteric arteriogram for persistent GI bleding and + tagged RBC scan. Hemodynamics and VAD parameters monitored by me and IR staff throughout the procedure. Blood pressure was monitored with automatic BP cuff as the doppler was reflecting the MAP. All of her primary and back up equipment was present during the procedure as well as myself to assist with management.   Pre-procedure:  13:00 - 8600 / 3.0 lpm / 7.0 / 4.1 w 106 MAP  Sedation Induction:Fentanyl per IR team  13:05 - 8600 / 3.4 lpm / 7.3 / 4.2w 96 MAP  13:10 - 8600 / 3.6 lpm / 7.1 / 4.1w 104 MAP  13:15 - 8600  3.6 lpm / 7.0 / 4.0w  100 MAP  13:20 - 8600 / 3.6 lpm / 7.2 / 4.1w 105 MAP  13:30 - 8600 / 3.5 lpm / 7.0 / 4.1w 100 MAP   Interventions: no identified areas of bleeding. No hemodynamic interventions needed  Recovery area:  13:32 - 8600 / 3.2 lpm / 7.1 / 4.2w 100 MAP   Patient tolerated the procedure well. VAD parameters and hemodynamics were stable throughout the case.   Patient Disposition: 4N in the care of her nurse   Janene Madeira, RN [place] Coordinator   Office: [phone] 24/7 [place] Pager: [phone]

## 2016-09-24 NOTE — Progress Notes (Signed)
HeartMate 2 Rounding Note  Subjective:    Tagged RBC bleeding rectosigmoid. Due to active bleeding she received FFP, fluid bolus, and LVAD speed was cut back.   Further BRBPR overnight. Feeling OK this morning. Somewhat lightheaded. No SOB.   INR 2.6 -> 2.9 despite FFP.   LVAD INTERROGATION:  HeartMate II LVAD:  Flow 3.7 liters/min, speed 8600, power 4.0,  PI 7.0.  15 low flow events yesterday morning. None after 1000 am. 2-3 PI events   Objective:    Vital Signs:   Temp:  [98 F (36.7 C)-98.8 F (37.1 C)] 98.2 F (36.8 C) (01/23 0700) Pulse Rate:  [42-85] 42 (01/23 0700) Resp:  [15-18] 16 (01/23 0700) BP: (106-127)/(72-94) 115/94 (01/23 0700) SpO2:  [96 %-98 %] 98 % (01/23 0700) Weight:  [168 lb 3.2 oz (76.3 kg)] 168 lb 3.2 oz (76.3 kg) (01/23 0425) Last BM Date: 09/23/16 Mean arterial Pressure 80-100s  Intake/Output:   Intake/Output Summary (Last 24 hours) at 09/24/16 0801 Last data filed at 09/23/16 1637  Gross per 24 hour  Intake              743 ml  Output                0 ml  Net              743 ml     Physical Exam: General:  Elderly and fatigued appearing. NAD.  HEENT: Normal  Neck: supple. JVP 6-7 cm. Carotids 2+ bilat; no bruits. No thyromegaly or nodule note.d  Cor: Mechanical heart sounds with LVAD hum present. Lungs: CTAB, normal effort Abdomen: soft, NT, ND, no HSM. No bruits or masses. +BS  Driveline: C/D/I; securement device intact and driveline incorporated Extremities: no cyanosis, clubbing, rash. No peripheral edema.  Neuro: alert & orientedx3, cranial nerves grossly intact. moves all 4 extremities w/o difficulty. Affect pleasant  Telemetry: Reviewed, NSR  Labs: Basic Metabolic Panel:  Recent Labs Lab 09/21/16 0949 09/22/16 0305 09/23/16 0309 09/24/16 0308  NA 131* 137 138 140  K 3.6 3.1* 4.1 3.6  CL 103 112* 116* 115*  CO2 20* 18* 18* 20*  GLUCOSE 95 81 83 78  BUN 17 11 <5* <5*  CREATININE 1.44* 0.97 0.87 0.83  CALCIUM 9.1  8.4* 8.2* 8.5*    Liver Function Tests:  Recent Labs Lab 09/21/16 0949  AST 28  ALT 22  ALKPHOS 84  BILITOT 0.6  PROT 8.4*  ALBUMIN 3.2*   No results for input(s): LIPASE, AMYLASE in the last 168 hours. No results for input(s): AMMONIA in the last 168 hours.  CBC:  Recent Labs Lab 09/21/16 0949 09/22/16 0305 09/22/16 1558 09/23/16 0309 09/23/16 1102 09/24/16 0308  WBC 7.2 5.1 5.0 4.8 4.6 4.5  NEUTROABS 4.1 2.5  --  2.4  --  1.7  HGB 12.9 11.4* 12.2 10.1* 10.3* 10.3*  HCT 39.5 35.9* 38.0 31.1* 32.2* 31.1*  MCV 91.4 91.6 91.3 91.7 91.7 90.1  PLT 177 155 180 147* 155 153    INR:  Recent Labs Lab 09/18/16 09/21/16 0949 09/22/16 0305 09/23/16 0309 09/24/16 0308  INR 2.6 2.53 3.04 2.65 2.94    Other results:  EKG:   Imaging: Nm Gi Blood Loss  Result Date: 09/22/2016 CLINICAL DATA:  Lower GI bleed. EXAM: NUCLEAR MEDICINE GASTROINTESTINAL BLEEDING SCAN TECHNIQUE: Sequential abdominal images were obtained following intravenous administration of Tc-31m labeled red blood cells. RADIOPHARMACEUTICALS:  25.1 mCi Tc-82m in-vitro labeled red cells. COMPARISON:  None. FINDINGS:  Progressively increasing pool of radiotracer in the pelvis in the region of the rectosigmoid colon most concerning for active gastrointestinal bleeding. Normal physiologic biodistribution of the radiotracer is demonstrated throughout the abdomen. IMPRESSION: Progressively increasing pool of radiotracer in the pelvis in the region of the rectosigmoid colon most concerning for active gastrointestinal bleeding. Critical Value/emergent results were called by telephone at the time of interpretation on 09/22/2016 at 3:13 pm to Dr. Glori Bickers , who verbally acknowledged these results. Electronically Signed   By: Kathreen Devoid   On: 09/22/2016 15:22     Medications:     Scheduled Medications: . amiodarone  100 mg Oral Daily  . carvedilol  3.125 mg Oral BID WC  . gabapentin  100 mg Oral QHS  .  hydrALAZINE  75 mg Oral TID  . losartan  100 mg Oral Daily  . pantoprazole  40 mg Oral Daily  . potassium chloride SA  20 mEq Oral BID    Infusions:   PRN Medications:     Assessment/plan:   1. Nausea/vomiting - likely viral gastroenteritis. Multiple low flow alarms on VAD - Continues to have low flows. Likely 2/2 #2.   2. Lower GI bleeding - Further bloody BM overnight.  - Tagged scan with active bleed in rectosigmoid. Bleeding slowed with FFP.  - INR 2.65 -> 2.94 despite FFP - Hgb down but stable. 12.2 > 10 > 10.3. Has T&S standing. Lost band in IR.  - Coumadin on hold. Continue PPI.  - GI appreciated.  3. Chronic systolic HF s/p HM-II VAD - VAD interrogated personally. 15 low flows yesterday am.  None since.  4. HTN  - Maps 85 this am. Auto cuff 110 at one point. ? Modified systolic.  - Continue Hydral 75 mg TID. Can increase as needed.  - Continue losartan 100 mg daily.  5. AKI - Improved and stable.    IR following for possible visceral arteriography/embolization.   Length of Stay: 3  Annamaria Helling  09/24/2016, 8:01 AM  VAD Team --- VAD ISSUES ONLY--- Pager [phone] (7am - 7am)  Advanced Heart Failure Team  Pager [phone] (M-F; 7a - 4p)  Please contact [facility] for night-coverage after hours (4p -7a ) and weekends on [email or web address]  Patient seen and examined with Oda Kilts, PA-C. We discussed all aspects of the encounter. I agree with the assessment and plan as stated above.   She developed recurrent bleeding today. Discussed with GI and IR and patient taken down for visceral angiography with no active bleed identified. Will continue to manage medically. Hopefully bleeding will resolve as INR trends down. She has received 2u FFP so far but we have been hesitant to give her more due to risk of pump thrombosis. Can consider octreotide as needed. VAD parameters and MAPs relatively stable. I will pull arterial sheath myself.   Katina Remick,  Jashua Knaak,MD 3:04 PM

## 2016-09-24 NOTE — Progress Notes (Signed)
Daily Rounding Note  09/24/2016, 9:43 AM  LOS: 3 days   SUBJECTIVE:   Chief complaint: Painless hematochezia. Episode around 9:30 yesterday evening, none since.  IR has not been back to see pt since yesterday early afternoon.  Pt NPO since med day yesterday and only had a cracker and clears since her admission 3 days ago.  She is very hungry.    Denies chest pain.    OBJECTIVE:         Vital signs in last 24 hours:    Temp:  [98 F (36.7 C)-98.8 F (37.1 C)] 98.2 F (36.8 C) (01/23 0700) Pulse Rate:  [42-85] 42 (01/23 0700) Resp:  [15-18] 16 (01/23 0700) BP: (106-127)/(72-94) 115/94 (01/23 0730) SpO2:  [96 %-98 %] 98 % (01/23 0700) Weight:  [76.3 kg (168 lb 3.2 oz)] 76.3 kg (168 lb 3.2 oz) (01/23 0425) Last BM Date: 09/23/16 Filed Weights   09/22/16 0400 09/23/16 0628 09/24/16 0425  Weight: 76.2 kg (167 lb 15.9 oz) 77 kg (169 lb 12.8 oz) 76.3 kg (168 lb 3.2 oz)   General: pleasant, well-appearing   Heart: RRR underneath VAD hum Chest: clear bil.  No cough or SOB Abdomen: soft, NT, ND.  Active BS.  VAD driveline site benign looking  Extremities: no CCE Neuro/Psych:  Oriented x 3.  No limb weakness or tremor.    Intake/Output from previous day: 01/22 0701 - 01/23 0700 In: 983 [P.O.:240; I.V.:32; Blood:211; IV [id number] Out: -   Intake/Output this shift: Total I/O In: 30 [P.O.:30] Out: -   Lab Results:  Recent Labs  09/23/16 0309 09/23/16 1102 09/24/16 0308  WBC 4.8 4.6 4.5  HGB 10.1* 10.3* 10.3*  HCT 31.1* 32.2* 31.1*  PLT 147* 155 153   BMET  Recent Labs  09/22/16 0305 09/23/16 0309 09/24/16 0308  NA 137 138 140  K 3.1* 4.1 3.6  CL 112* 116* 115*  CO2 18* 18* 20*  GLUCOSE 81 83 78  BUN 11 <5* <5*  CREATININE 0.97 0.87 0.83  CALCIUM 8.4* 8.2* 8.5*   LFT  Recent Labs  09/21/16 0949  PROT 8.4*  ALBUMIN 3.2*  AST 28  ALT 22  ALKPHOS 84  BILITOT 0.6   PT/INR  Recent Labs  09/23/16 0309 09/24/16 0308  LABPROT 28.7* 31.2*  INR 2.65 2.94   Hepatitis Panel No results for input(s): HEPBSAG, HCVAB, HEPAIGM, HEPBIGM in the last 72 hours.  Studies/Results: Nm Gi Blood Loss  Result Date: 09/22/2016 CLINICAL DATA:  Lower GI bleed. EXAM: NUCLEAR MEDICINE GASTROINTESTINAL BLEEDING SCAN TECHNIQUE: Sequential abdominal images were obtained following intravenous administration of Tc-19m labeled red blood cells. RADIOPHARMACEUTICALS:  25.1 mCi Tc-41m in-vitro labeled red cells. COMPARISON:  None. FINDINGS: Progressively increasing pool of radiotracer in the pelvis in the region of the rectosigmoid colon most concerning for active gastrointestinal bleeding. Normal physiologic biodistribution of the radiotracer is demonstrated throughout the abdomen. IMPRESSION: Progressively increasing pool of radiotracer in the pelvis in the region of the rectosigmoid colon most concerning for active gastrointestinal bleeding. Critical Value/emergent results were called by telephone at the time of interpretation on 09/22/2016 at 3:13 pm to Dr. Glori Bickers , who verbally acknowledged these results. Electronically Signed   By: Kathreen Devoid   On: 09/22/2016 15:22    ASSESMENT:   *  Hematochezia.  In setting of Coumadin. 12/2009 Colonoscopy, Dr Benson Norway: descending adenoma and pan-diverticulosis > in sigmoid, int/ext hemorrhoids.  Bleeding hemorrhoids 07/2015.  Dr  Carlean Purl considered hemorrhoidal banding.  09/22/16 Nuc RBC scan: bleeding in rectosigmoid IR   *  Chronic Coumadin for s/p LVAD 04/2013.  INR 3.0 >> 2.6.  Last dose Coumadin 10/20.  No IV heparin yet in place.   S/p FFP x 2 on 09/22/16, 09/23/16.  No Vitamin K given. INR today is higher than yesterday.    *  Blood loss anemia.  hgb down ~ 2.5 gm but stable over 24 hours.  On BID po iron at home.  Hx MGUS.   PLAN   *  Dr Haroldine Laws will see pt this AM.  IR has not appear to have been notified of last night's bleeding but at this  point, 12 hours later, with no bleeding recurrence, the chances of a positive angiogram are greatly diminished.  Pt ought to get fed solids if no plans to send to IR.    Azucena Freed  09/24/2016, 9:43 AM Pager: [phone]  GI ATTENDING  Interval history data reviewed. Interval progress note as outlined above. No additions or deletions.  Docia Chuck. Geri Seminole., M.D. [facility] Division of Gastroenterology

## 2016-09-24 NOTE — Procedures (Signed)
S/p SMA AND IMA ANGIOS  Neg for bleed Full report in PACS 5FR  RT CFA SHEATH REMAINS

## 2016-09-25 LAB — BASIC METABOLIC PANEL
Anion gap: 9 (ref 5–15)
BUN: <5 mg/dL — ABNORMAL LOW (ref 6–20)
CO2: 20 mmol/L — ABNORMAL LOW (ref 22–32)
Calcium: 8.6 mg/dL — ABNORMAL LOW (ref 8.9–10.3)
Chloride: 110 mmol/L (ref 101–111)
Creatinine, Ser: 0.98 mg/dL (ref 0.44–1.00)
GFR calc Af Amer: >60 mL/min (ref >60)
GFR calc non Af Amer: 53 mL/min — ABNORMAL LOW (ref >60)
Glucose, Bld: 75 mg/dL (ref 65–99)
Potassium: 3.4 mmol/L — ABNORMAL LOW (ref 3.5–5.1)
Sodium: 139 mmol/L (ref 135–145)

## 2016-09-25 LAB — CBC WITH DIFFERENTIAL/PLATELET
Basophils Absolute: 0 10*3/uL (ref 0.0–0.1)
Basophils Relative: 0 %
Eosinophils Absolute: 0.2 10*3/uL (ref 0.0–0.7)
Eosinophils Relative: 4 %
HCT: 29.6 % — ABNORMAL LOW (ref 36.0–46.0)
Hemoglobin: 9.8 g/dL — ABNORMAL LOW (ref 12.0–15.0)
Lymphocytes Relative: 36 %
Lymphs Abs: 1.7 10*3/uL (ref 0.7–4.0)
MCH: 29.9 pg (ref 26.0–34.0)
MCHC: 33.1 g/dL (ref 30.0–36.0)
MCV: 90.2 fL (ref 78.0–100.0)
Monocytes Absolute: 0.5 10*3/uL (ref 0.1–1.0)
Monocytes Relative: 10 %
Neutro Abs: 2.4 10*3/uL (ref 1.7–7.7)
Neutrophils Relative %: 50 %
Platelets: 145 10*3/uL — ABNORMAL LOW (ref 150–400)
RBC: 3.28 MIL/uL — ABNORMAL LOW (ref 3.87–5.11)
RDW: 15.7 % — ABNORMAL HIGH (ref 11.5–15.5)
WBC: 4.8 10*3/uL (ref 4.0–10.5)

## 2016-09-25 LAB — PROTIME-INR
INR: 2.65
Prothrombin Time: 28.7 seconds — ABNORMAL HIGH (ref 11.4–15.2)

## 2016-09-25 LAB — LACTATE DEHYDROGENASE: LDH: 192 U/L (ref 98–192)

## 2016-09-25 MED ORDER — POTASSIUM CHLORIDE CRYS ER 20 MEQ PO TBCR
20.0000 meq | EXTENDED_RELEASE_TABLET | Freq: Once | ORAL | Status: AC
  Administered 2016-09-25: 20 meq via ORAL
  Filled 2016-09-25: qty 1

## 2016-09-25 NOTE — Progress Notes (Signed)
Patient ID: Brianna Hanson, female   DOB: [birth year], 82 y.o.   MRN: [id number]    Referring Physician(s): Dr. Edgemoor Cellar  Supervising Physician: Arne Cleveland  Patient Status: [facility] - In-pt  Chief Complaint: Rectal bleeding  Subjective: Patient doing well today.  No evidence of bleeding during angio yesterday.  No further bleeding today.  Ate solid food for breakfast.  Denies abdominal pain.  Sheath removed yesterday.  Allergies: Patient has no known allergies.  Medications: Prior to Admission medications   Medication Sig Start Date End Date Taking? Authorizing Provider  amiodarone (PACERONE) 200 MG tablet Take 0.5 tablets (100 mg total) by mouth daily. 10/19/15  Yes Jolaine Artist, MD  carvedilol (COREG) 6.25 MG tablet Take 1 tablet (6.25 mg total) by mouth 2 (two) times daily with a meal. 01/01/16  Yes Jolaine Artist, MD  Cholecalciferol (VITAMIN D3) 1000 UNITS CAPS Take 1 tablet by mouth daily.     Yes Historical Provider, MD  docusate sodium (COLACE) 100 MG capsule Take 1 capsule (100 mg total) by mouth 2 (two) times daily. Patient taking differently: Take 100 mg by mouth daily.  07/11/15  Yes Jolaine Artist, MD  ferrous sulfate 325 (65 FE) MG tablet Take 1 tablet (325 mg total) by mouth 2 (two) times daily with a meal. 07/11/15  Yes Jolaine Artist, MD  gabapentin (NEURONTIN) 100 MG capsule TAKE ONE CAPSULE BY MOUTH AT BEDTIME OR AS DIRECTED 09/12/16  Yes Jolaine Artist, MD  hydrALAZINE (APRESOLINE) 50 MG tablet Take 1.5 tablets (75 mg total) by mouth 3 (three) times daily. 08/08/16 11/06/16 Yes Shaune Pascal [name], MD  losartan (COZAAR) 50 MG tablet Take 2 tablets (100 mg total) by mouth daily. Patient taking differently: Take 100 mg by mouth every evening.  06/25/16  Yes Shaune Pascal [name], MD  pantoprazole (PROTONIX) 40 MG tablet TAKE 1 TABLET BY MOUTH EVERY DAY 02/15/16  Yes Jolaine Artist, MD  potassium chloride SA (K-DUR,KLOR-CON) 20 MEQ tablet Take 20  mEq by mouth 2 (two) times daily.   Yes Historical Provider, MD  warfarin (COUMADIN) 2.5 MG tablet Take 5 mg by mouth daily.   Yes Historical Provider, MD  traMADol (ULTRAM) 50 MG tablet Take 1 tablet (50 mg total) by mouth every 12 (twelve) hours as needed for moderate pain. 07/01/16   Jolaine Artist, MD    Vital Signs: BP (!) 114/103 (BP Location: Left Arm)   Pulse 77   Temp 97.8 F (36.6 C) (Oral)   Resp 18   Ht 5' (1.524 m)   Wt 168 lb 8 oz (76.4 kg)   SpO2 97%   BMI 32.91 kg/m   Physical Exam: Abd: soft, NT, ND, +BS, right inguinal site is c/d/i with no bleeding  Imaging: Nm Gi Blood Loss  Result Date: 09/22/2016 CLINICAL DATA:  Lower GI bleed. EXAM: NUCLEAR MEDICINE GASTROINTESTINAL BLEEDING SCAN TECHNIQUE: Sequential abdominal images were obtained following intravenous administration of Tc-40m labeled red blood cells. RADIOPHARMACEUTICALS:  25.1 mCi Tc-27m in-vitro labeled red cells. COMPARISON:  None. FINDINGS: Progressively increasing pool of radiotracer in the pelvis in the region of the rectosigmoid colon most concerning for active gastrointestinal bleeding. Normal physiologic biodistribution of the radiotracer is demonstrated throughout the abdomen. IMPRESSION: Progressively increasing pool of radiotracer in the pelvis in the region of the rectosigmoid colon most concerning for active gastrointestinal bleeding. Critical Value/emergent results were called by telephone at the time of interpretation on 09/22/2016 at 3:13 pm to Dr. Quillian Quince  [name] , who verbally acknowledged these results. Electronically Signed   By: Kathreen Devoid   On: 09/22/2016 15:22   Ir Angiogram Visceral Selective  Result Date: 09/24/2016 INDICATION: Acute intermittent lower GI bleeding localized to the rectosigmoid colon by nuclear medicine imaging EXAM: SELECTIVE VISCERAL ARTERIOGRAPHY; IR ULTRASOUND GUIDANCE VASC ACCESS RIGHT MEDICATIONS: None. ANESTHESIA/SEDATION: A total of Versed 0 mg and Fentanyl 25  mcg was administered intravenously. Moderate Sedation Time: 0 minutes. [name] level of consciousness and vital signs were monitored continuously by radiology nursing throughout the procedure under my direct supervision. CONTRAST:  86 cc Isovue 300 FLUOROSCOPY TIME:  Fluoroscopy Time: 5 minutes 36 seconds (206 mGy). COMPLICATIONS: None immediate. PROCEDURE: Informed consent was obtained from the patient following explanation of the procedure, risks, benefits and alternatives. The patient understands, agrees and consents for the procedure. All questions were addressed. A time out was performed prior to the initiation of the procedure. Maximal barrier sterile technique utilized including caps, mask, sterile gowns, sterile gloves, large sterile drape, hand hygiene, and Betadine prep. Previous CT reviewed. Under sterile conditions and local anesthesia, ultrasound micropuncture access performed of the right common femoral artery. Five French sheath inserted over guidewire. C2 catheter utilized to select the SMA. Selective SMA angiogram performed. SMA: SMA is widely patent. Jejunal and colonic branches are patent. No evidence of contrast extravasation or active bleeding. On the venous phase the mesenteric and portal veins are patent. Catheter exchanged for a sos Omni select catheter. This was utilized to select the IMA. IMA angiograms performed. IMA: IMA is widely patent. Left colic and superior rectal branches are patent. No visualized active contrast extravasation or bleeding. On the venous phase, the inferior mesenteric vein is patent. Catheter access removed. 5 French right common femoral artery sheath secured with pressurized saline. Patient's current INR is 2.94. IMPRESSION: Patent SMA and IMA without evidence of active contrast extravasation or bleeding. Electronically Signed   By: Jerilynn Mages.  Shick M.D.   On: 09/24/2016 14:00   Ir Angiogram Visceral Selective  Result Date: 09/24/2016 INDICATION: Acute  intermittent lower GI bleeding localized to the rectosigmoid colon by nuclear medicine imaging EXAM: SELECTIVE VISCERAL ARTERIOGRAPHY; IR ULTRASOUND GUIDANCE VASC ACCESS RIGHT MEDICATIONS: None. ANESTHESIA/SEDATION: A total of Versed 0 mg and Fentanyl 25 mcg was administered intravenously. Moderate Sedation Time: 0 minutes. [name] level of consciousness and vital signs were monitored continuously by radiology nursing throughout the procedure under my direct supervision. CONTRAST:  86 cc Isovue 300 FLUOROSCOPY TIME:  Fluoroscopy Time: 5 minutes 36 seconds (206 mGy). COMPLICATIONS: None immediate. PROCEDURE: Informed consent was obtained from the patient following explanation of the procedure, risks, benefits and alternatives. The patient understands, agrees and consents for the procedure. All questions were addressed. A time out was performed prior to the initiation of the procedure. Maximal barrier sterile technique utilized including caps, mask, sterile gowns, sterile gloves, large sterile drape, hand hygiene, and Betadine prep. Previous CT reviewed. Under sterile conditions and local anesthesia, ultrasound micropuncture access performed of the right common femoral artery. Five French sheath inserted over guidewire. C2 catheter utilized to select the SMA. Selective SMA angiogram performed. SMA: SMA is widely patent. Jejunal and colonic branches are patent. No evidence of contrast extravasation or active bleeding. On the venous phase the mesenteric and portal veins are patent. Catheter exchanged for a sos Omni select catheter. This was utilized to select the IMA. IMA angiograms performed. IMA: IMA is widely patent. Left colic and superior rectal branches are patent. No visualized active  contrast extravasation or bleeding. On the venous phase, the inferior mesenteric vein is patent. Catheter access removed. 5 French right common femoral artery sheath secured with pressurized saline. Patient's current INR is  2.94. IMPRESSION: Patent SMA and IMA without evidence of active contrast extravasation or bleeding. Electronically Signed   By: Jerilynn Mages.  Shick M.D.   On: 09/24/2016 14:00   Ir US Guide Vasc Access Right  Result Date: 09/24/2016 INDICATION: Acute intermittent lower GI bleeding localized to the rectosigmoid colon by nuclear medicine imaging EXAM: SELECTIVE VISCERAL ARTERIOGRAPHY; IR ULTRASOUND GUIDANCE VASC ACCESS RIGHT MEDICATIONS: None. ANESTHESIA/SEDATION: A total of Versed 0 mg and Fentanyl 25 mcg was administered intravenously. Moderate Sedation Time: 0 minutes. [name] level of consciousness and vital signs were monitored continuously by radiology nursing throughout the procedure under my direct supervision. CONTRAST:  86 cc Isovue 300 FLUOROSCOPY TIME:  Fluoroscopy Time: 5 minutes 36 seconds (206 mGy). COMPLICATIONS: None immediate. PROCEDURE: Informed consent was obtained from the patient following explanation of the procedure, risks, benefits and alternatives. The patient understands, agrees and consents for the procedure. All questions were addressed. A time out was performed prior to the initiation of the procedure. Maximal barrier sterile technique utilized including caps, mask, sterile gowns, sterile gloves, large sterile drape, hand hygiene, and Betadine prep. Previous CT reviewed. Under sterile conditions and local anesthesia, ultrasound micropuncture access performed of the right common femoral artery. Five French sheath inserted over guidewire. C2 catheter utilized to select the SMA. Selective SMA angiogram performed. SMA: SMA is widely patent. Jejunal and colonic branches are patent. No evidence of contrast extravasation or active bleeding. On the venous phase the mesenteric and portal veins are patent. Catheter exchanged for a sos Omni select catheter. This was utilized to select the IMA. IMA angiograms performed. IMA: IMA is widely patent. Left colic and superior rectal branches are patent. No  visualized active contrast extravasation or bleeding. On the venous phase, the inferior mesenteric vein is patent. Catheter access removed. 5 French right common femoral artery sheath secured with pressurized saline. Patient's current INR is 2.94. IMPRESSION: Patent SMA and IMA without evidence of active contrast extravasation or bleeding. Electronically Signed   By: Jerilynn Mages.  Shick M.D.   On: 09/24/2016 14:00    Labs:  CBC:  Recent Labs  09/23/16 0309 09/23/16 1102 09/24/16 0308 09/25/16 0151  WBC 4.8 4.6 4.5 4.8  HGB 10.1* 10.3* 10.3* 9.8*  HCT 31.1* 32.2* 31.1* 29.6*  PLT 147* 155 153 145*    COAGS:  Recent Labs  09/28/15 1112  09/23/16 0309 09/24/16 0308 09/24/16 1410 09/25/16 0151  INR 2.55*  < > 2.65 2.94 2.81 2.65  APTT 42*  --   --   --   --   --   < > = values in this interval not displayed.  BMP:  Recent Labs  09/22/16 0305 09/23/16 0309 09/24/16 0308 09/25/16 0151  NA 137 138 140 139  K 3.1* 4.1 3.6 3.4*  CL 112* 116* 115* 110  CO2 18* 18* 20* 20*  GLUCOSE 81 83 78 75  BUN 11 <5* <5* <5*  CALCIUM 8.4* 8.2* 8.5* 8.6*  CREATININE 0.97 0.87 0.83 0.98  GFRNONAA 54* >60 >60 53*  GFRAA >60 >60 >60 >60    LIVER FUNCTION TESTS:  Recent Labs  10/14/15 0352 02/01/16 1042 08/06/16 1100 09/21/16 0949  BILITOT 0.2* 0.4 0.6 0.6  AST 14* 25 21 28   ALT 15 20 15 22   ALKPHOS 46 73 91 84  PROT  6.4* 7.5 7.8 8.4*  ALBUMIN 2.3* 3.1* 3.2* 3.2*    Assessment and Plan: 1. LGI bleed, s/p diagnostic angiogram on 1/23  -no active bleeding was noted on angio yesterday. No further bleeding since that time. -sheath site is c/d/i with no bleeding -no further IR needs.   Electronically Signed: Henreitta Cea 09/25/2016, 10:44 AM   I spent a total of 15 Minutes at the the patient's bedside AND on the patient's hospital floor or unit, greater than 50% of which was counseling/coordinating care for LGI bleed

## 2016-09-25 NOTE — Progress Notes (Signed)
Daily Rounding Note  09/25/2016, 8:14 AM  LOS: 4 days   SUBJECTIVE:   Chief complaint:  Hematochezia.     Pt passed one 6 to 8 oz of blood yesterday afternoon, before the angiogram.  No stools or bleeding since. Pt feels well.  Tolerating solids.   OBJECTIVE:         Vital signs in last 24 hours:    Temp:  [97.6 F (36.4 C)-98.4 F (36.9 C)] 97.6 F (36.4 C) (01/24 0505) Pulse Rate:  [67-120] 72 (01/24 0505) Resp:  [12-18] 18 (01/24 0505) BP: (114-136)/(69-94) 120/87 (01/24 0505) SpO2:  [97 %-100 %] 97 % (01/24 0505) Weight:  [76.4 kg (168 lb 8 oz)] 76.4 kg (168 lb 8 oz) (01/24 0505) Last BM Date: 09/24/16 Filed Weights   09/23/16 0628 09/24/16 0425 09/25/16 0505  Weight: 77 kg (169 lb 12.8 oz) 76.3 kg (168 lb 3.2 oz) 76.4 kg (168 lb 8 oz)   General: pleasant, looks well   Heart: RRR undelying VAD hum Chest: clear bil.  No cough or labored breathing Abdomen: soft, NT, ND.  Active BS  Extremities: no CCE Neuro/Psych:  Oriented x 3.  Fully alert without gross deficits.  Calm, relaxed, in good spirits.   Intake/Output from previous day: 01/23 0701 - 01/24 0700 In: 30 [P.O.:30] Out: -   Intake/Output this shift: No intake/output data recorded.  Lab Results:  Recent Labs  09/23/16 1102 09/24/16 0308 09/25/16 0151  WBC 4.6 4.5 4.8  HGB 10.3* 10.3* 9.8*  HCT 32.2* 31.1* 29.6*  PLT 155 153 145*   BMET  Recent Labs  09/23/16 0309 09/24/16 0308 09/25/16 0151  NA 138 140 139  K 4.1 3.6 3.4*  CL 116* 115* 110  CO2 18* 20* 20*  GLUCOSE 83 78 75  BUN <5* <5* <5*  CREATININE 0.87 0.83 0.98  CALCIUM 8.2* 8.5* 8.6*   LFT No results for input(s): PROT, ALBUMIN, AST, ALT, ALKPHOS, BILITOT, BILIDIR, IBILI in the last 72 hours. PT/INR  Recent Labs  09/24/16 1410 09/25/16 0151  LABPROT 30.1* 28.7*  INR 2.81 2.65   Hepatitis Panel No results for input(s): HEPBSAG, HCVAB, HEPAIGM, HEPBIGM in the last  72 hours.  Studies/Results: Ir Angiogram Visceral Selective  Result Date: 09/24/2016 INDICATION: Acute intermittent lower GI bleeding localized to the rectosigmoid colon by nuclear medicine imaging EXAM: SELECTIVE VISCERAL ARTERIOGRAPHY; IR ULTRASOUND GUIDANCE VASC ACCESS RIGHT MEDICATIONS: None. ANESTHESIA/SEDATION: A total of Versed 0 mg and Fentanyl 25 mcg was administered intravenously. Moderate Sedation Time: 0 minutes. [name] level of consciousness and vital signs were monitored continuously by radiology nursing throughout the procedure under my direct supervision. CONTRAST:  86 cc Isovue 300 FLUOROSCOPY TIME:  Fluoroscopy Time: 5 minutes 36 seconds (206 mGy). COMPLICATIONS: None immediate. PROCEDURE: Informed consent was obtained from the patient following explanation of the procedure, risks, benefits and alternatives. The patient understands, agrees and consents for the procedure. All questions were addressed. A time out was performed prior to the initiation of the procedure. Maximal barrier sterile technique utilized including caps, mask, sterile gowns, sterile gloves, large sterile drape, hand hygiene, and Betadine prep. Previous CT reviewed. Under sterile conditions and local anesthesia, ultrasound micropuncture access performed of the right common femoral artery. Five French sheath inserted over guidewire. C2 catheter utilized to select the SMA. Selective SMA angiogram performed. SMA: SMA is widely patent. Jejunal and colonic branches are patent. No evidence of contrast extravasation or active bleeding.  On the venous phase the mesenteric and portal veins are patent. Catheter exchanged for a sos Omni select catheter. This was utilized to select the IMA. IMA angiograms performed. IMA: IMA is widely patent. Left colic and superior rectal branches are patent. No visualized active contrast extravasation or bleeding. On the venous phase, the inferior mesenteric vein is patent. Catheter access  removed. 5 French right common femoral artery sheath secured with pressurized saline. Patient's current INR is 2.94. IMPRESSION: Patent SMA and IMA without evidence of active contrast extravasation or bleeding. Electronically Signed   By: Jerilynn Mages.  Shick M.D.   On: 09/24/2016 14:00   Scheduled Meds: . amiodarone  100 mg Oral Daily  . carvedilol  3.125 mg Oral BID WC  . gabapentin  100 mg Oral QHS  . hydrALAZINE  75 mg Oral TID  . losartan  100 mg Oral Daily  . pantoprazole  40 mg Oral Daily  . potassium chloride SA  20 mEq Oral BID  . potassium chloride  20 mEq Oral Once   Continuous Infusions: PRN Meds:.   ASSESMENT:   * Hematochezia. In setting of Coumadin. Resolved.  12/2009 Colonoscopy, Dr Benson Norway: descending adenoma and pan-diverticulosis >in sigmoid, int/ext hemorrhoids.  Bleeding hemorrhoids 07/2015. Dr Carlean Purl considered hemorrhoidal banding but problem resolved.   09/22/16 Nuc RBC scan: bleeding in rectosigmoid 1/23 SMA/IMA angiography.  Neg for bleed.    * Chronic Coumadin for s/p LVAD 04/2013. INR 3.0 >>2.6.  Last dose Coumadin 10/20. No IV heparin yet in place.  S/p FFP x 2 on 09/22/16, 09/23/16. No Vitamin K given. INR today is higher than yesterday.    * Acute blood loss anemia.  Has not required PRBCs.   On BID po iron at home for chronic iron def anemia.    Hx MGUS.12/2009   *  Thrombocytopenia.  Non-critical.     PLAN   *  Resume heparin/Coumadin?  *  Discussed/explained diverticulosis to pt and nature of diverticular bleeding, chances that it may recurr.      Azucena Freed  09/25/2016, 8:14 AM Pager: [phone]  GI ATTENDING  Interval history, then, data reviewed. Nothing to have from GI perspective. Available for questions or problems. Will sign off.  Docia Chuck. Geri Seminole., M.D. [facility] Division of Gastroenterology

## 2016-09-25 NOTE — Progress Notes (Signed)
HeartMate 2 Rounding Note  Subjective:    Tagged RBC bleeding rectosigmoid. Due to active bleeding she received FFP, fluid bolus, and LVAD speed was cut back.   S/P mesenteric angiography --> no source of bleeding. No BMs over night.   Denies SOB.     INR 2.65.   LVAD INTERROGATION:  HeartMate II LVAD:  Flow 3.6liters/min, speed 8600, power 4.3,  PI 7.0.   1 low flow yesterday at 1600. 1 PI over night.  Objective:    Vital Signs:   Temp:  [97.6 F (36.4 C)-98.4 F (36.9 C)] 97.6 F (36.4 C) (01/24 0505) Pulse Rate:  [67-120] 72 (01/24 0505) Resp:  [12-18] 18 (01/24 0505) BP: (114-136)/(69-94) 120/87 (01/24 0505) SpO2:  [97 %-100 %] 97 % (01/24 0505) Weight:  [168 lb 8 oz (76.4 kg)] 168 lb 8 oz (76.4 kg) (01/24 0505) Last BM Date: 09/24/16 Mean arterial Pressure 90s  Intake/Output:   Intake/Output Summary (Last 24 hours) at 09/25/16 0748 Last data filed at 09/24/16 0800  Gross per 24 hour  Intake               30 ml  Output                0 ml  Net               30 ml     Physical Exam: General:  Elderly.  In bed. NAD.   HEENT: Normal  Neck: supple. JVP 6-7 cm. Carotids 2+ bilat; no bruits. No thyromegaly or nodule noted  Cor: Mechanical heart sounds with LVAD hum present. Lungs: CTAB, normal effort Abdomen: soft, nontender, ND, no HSM. No bruits or masses. +BS  Driveline: C/D/I; securement device intact and driveline incorporated Extremities: no cyanosis, clubbing, rash. No edema.  R groin site stable Neuro: alert & orientedx3, cranial nerves grossly intact. moves all 4 extremities w/o difficulty. Affect pleasant  Telemetry: Reviewed, NSR  Labs: Basic Metabolic Panel:  Recent Labs Lab 09/21/16 0949 09/22/16 0305 09/23/16 0309 09/24/16 0308 09/25/16 0151  NA 131* 137 138 140 139  K 3.6 3.1* 4.1 3.6 3.4*  CL 103 112* 116* 115* 110  CO2 20* 18* 18* 20* 20*  GLUCOSE 95 81 83 78 75  BUN 17 11 <5* <5* <5*  CREATININE 1.44* 0.97 0.87 0.83 0.98    CALCIUM 9.1 8.4* 8.2* 8.5* 8.6*    Liver Function Tests:  Recent Labs Lab 09/21/16 0949  AST 28  ALT 22  ALKPHOS 84  BILITOT 0.6  PROT 8.4*  ALBUMIN 3.2*   No results for input(s): LIPASE, AMYLASE in the last 168 hours. No results for input(s): AMMONIA in the last 168 hours.  CBC:  Recent Labs Lab 09/21/16 0949 09/22/16 0305 09/22/16 1558 09/23/16 0309 09/23/16 1102 09/24/16 0308 09/25/16 0151  WBC 7.2 5.1 5.0 4.8 4.6 4.5 4.8  NEUTROABS 4.1 2.5  --  2.4  --  1.7 2.4  HGB 12.9 11.4* 12.2 10.1* 10.3* 10.3* 9.8*  HCT 39.5 35.9* 38.0 31.1* 32.2* 31.1* 29.6*  MCV 91.4 91.6 91.3 91.7 91.7 90.1 90.2  PLT 177 155 180 147* 155 153 145*    INR:  Recent Labs Lab 09/22/16 0305 09/23/16 0309 09/24/16 0308 09/24/16 1410 09/25/16 0151  INR 3.04 2.65 2.94 2.81 2.65    Other results:    Imaging: Ir Angiogram Visceral Selective  Result Date: 09/24/2016 INDICATION: Acute intermittent lower GI bleeding localized to the rectosigmoid colon by nuclear medicine imaging EXAM:  SELECTIVE VISCERAL ARTERIOGRAPHY; IR ULTRASOUND GUIDANCE VASC ACCESS RIGHT MEDICATIONS: None. ANESTHESIA/SEDATION: A total of Versed 0 mg and Fentanyl 25 mcg was administered intravenously. Moderate Sedation Time: 0 minutes. [name] level of consciousness and vital signs were monitored continuously by radiology nursing throughout the procedure under my direct supervision. CONTRAST:  86 cc Isovue 300 FLUOROSCOPY TIME:  Fluoroscopy Time: 5 minutes 36 seconds (206 mGy). COMPLICATIONS: None immediate. PROCEDURE: Informed consent was obtained from the patient following explanation of the procedure, risks, benefits and alternatives. The patient understands, agrees and consents for the procedure. All questions were addressed. A time out was performed prior to the initiation of the procedure. Maximal barrier sterile technique utilized including caps, mask, sterile gowns, sterile gloves, large sterile drape, hand  hygiene, and Betadine prep. Previous CT reviewed. Under sterile conditions and local anesthesia, ultrasound micropuncture access performed of the right common femoral artery. Five French sheath inserted over guidewire. C2 catheter utilized to select the SMA. Selective SMA angiogram performed. SMA: SMA is widely patent. Jejunal and colonic branches are patent. No evidence of contrast extravasation or active bleeding. On the venous phase the mesenteric and portal veins are patent. Catheter exchanged for a sos Omni select catheter. This was utilized to select the IMA. IMA angiograms performed. IMA: IMA is widely patent. Left colic and superior rectal branches are patent. No visualized active contrast extravasation or bleeding. On the venous phase, the inferior mesenteric vein is patent. Catheter access removed. 5 French right common femoral artery sheath secured with pressurized saline. Patient's current INR is 2.94. IMPRESSION: Patent SMA and IMA without evidence of active contrast extravasation or bleeding. Electronically Signed   By: Jerilynn Mages.  Shick M.D.   On: 09/24/2016 14:00   Ir Angiogram Visceral Selective  Result Date: 09/24/2016 INDICATION: Acute intermittent lower GI bleeding localized to the rectosigmoid colon by nuclear medicine imaging EXAM: SELECTIVE VISCERAL ARTERIOGRAPHY; IR ULTRASOUND GUIDANCE VASC ACCESS RIGHT MEDICATIONS: None. ANESTHESIA/SEDATION: A total of Versed 0 mg and Fentanyl 25 mcg was administered intravenously. Moderate Sedation Time: 0 minutes. [name] level of consciousness and vital signs were monitored continuously by radiology nursing throughout the procedure under my direct supervision. CONTRAST:  86 cc Isovue 300 FLUOROSCOPY TIME:  Fluoroscopy Time: 5 minutes 36 seconds (206 mGy). COMPLICATIONS: None immediate. PROCEDURE: Informed consent was obtained from the patient following explanation of the procedure, risks, benefits and alternatives. The patient understands, agrees and  consents for the procedure. All questions were addressed. A time out was performed prior to the initiation of the procedure. Maximal barrier sterile technique utilized including caps, mask, sterile gowns, sterile gloves, large sterile drape, hand hygiene, and Betadine prep. Previous CT reviewed. Under sterile conditions and local anesthesia, ultrasound micropuncture access performed of the right common femoral artery. Five French sheath inserted over guidewire. C2 catheter utilized to select the SMA. Selective SMA angiogram performed. SMA: SMA is widely patent. Jejunal and colonic branches are patent. No evidence of contrast extravasation or active bleeding. On the venous phase the mesenteric and portal veins are patent. Catheter exchanged for a sos Omni select catheter. This was utilized to select the IMA. IMA angiograms performed. IMA: IMA is widely patent. Left colic and superior rectal branches are patent. No visualized active contrast extravasation or bleeding. On the venous phase, the inferior mesenteric vein is patent. Catheter access removed. 5 French right common femoral artery sheath secured with pressurized saline. Patient's current INR is 2.94. IMPRESSION: Patent SMA and IMA without evidence of active contrast extravasation or bleeding.  Electronically Signed   By: Jerilynn Mages.  Shick M.D.   On: 09/24/2016 14:00   Ir US Guide Vasc Access Right  Result Date: 09/24/2016 INDICATION: Acute intermittent lower GI bleeding localized to the rectosigmoid colon by nuclear medicine imaging EXAM: SELECTIVE VISCERAL ARTERIOGRAPHY; IR ULTRASOUND GUIDANCE VASC ACCESS RIGHT MEDICATIONS: None. ANESTHESIA/SEDATION: A total of Versed 0 mg and Fentanyl 25 mcg was administered intravenously. Moderate Sedation Time: 0 minutes. [name] level of consciousness and vital signs were monitored continuously by radiology nursing throughout the procedure under my direct supervision. CONTRAST:  86 cc Isovue 300 FLUOROSCOPY TIME:   Fluoroscopy Time: 5 minutes 36 seconds (206 mGy). COMPLICATIONS: None immediate. PROCEDURE: Informed consent was obtained from the patient following explanation of the procedure, risks, benefits and alternatives. The patient understands, agrees and consents for the procedure. All questions were addressed. A time out was performed prior to the initiation of the procedure. Maximal barrier sterile technique utilized including caps, mask, sterile gowns, sterile gloves, large sterile drape, hand hygiene, and Betadine prep. Previous CT reviewed. Under sterile conditions and local anesthesia, ultrasound micropuncture access performed of the right common femoral artery. Five French sheath inserted over guidewire. C2 catheter utilized to select the SMA. Selective SMA angiogram performed. SMA: SMA is widely patent. Jejunal and colonic branches are patent. No evidence of contrast extravasation or active bleeding. On the venous phase the mesenteric and portal veins are patent. Catheter exchanged for a sos Omni select catheter. This was utilized to select the IMA. IMA angiograms performed. IMA: IMA is widely patent. Left colic and superior rectal branches are patent. No visualized active contrast extravasation or bleeding. On the venous phase, the inferior mesenteric vein is patent. Catheter access removed. 5 French right common femoral artery sheath secured with pressurized saline. Patient's current INR is 2.94. IMPRESSION: Patent SMA and IMA without evidence of active contrast extravasation or bleeding. Electronically Signed   By: Jerilynn Mages.  Shick M.D.   On: 09/24/2016 14:00     Medications:     Scheduled Medications: . amiodarone  100 mg Oral Daily  . carvedilol  3.125 mg Oral BID WC  . gabapentin  100 mg Oral QHS  . hydrALAZINE  75 mg Oral TID  . losartan  100 mg Oral Daily  . pantoprazole  40 mg Oral Daily  . potassium chloride SA  20 mEq Oral BID    Infusions:   PRN Medications:     Assessment/plan:    1. Nausea/vomiting - likely viral gastroenteritis. Multiple low flow alarms on VAD - 1 low flow yesterday. None over night.    2. Lower GI bleeding - - Tagged scan with active bleed in rectosigmoid. Bleeding slowed with FFP.  --S/P mesenteric arteriogram negative for active bleed.  - INR 2.65  - Hgb  9.8.  - Coumadin on hold. Continue PPI.  - GI appreciated.  3. Chronic systolic HF s/p HM-II VAD - VAD interrogated personally. 1 low flow yestereday 4. HTN  -  Off Continue Hydral 75 mg TID. Can increase as needed.  - Continue losartan 100 mg daily.  5. AKI - Improved and stable.  6. Hypokalemia- supplement K     Length of Stay: 4  Amy Clegg, NP  09/25/2016, 7:48 AM  VAD Team --- VAD ISSUES ONLY--- Pager [phone] (7am - 7am)  Advanced Heart Failure Team  Pager [phone] (M-F; 7a - 4p)  Please contact [facility] for night-coverage after hours (4p -7a ) and weekends on [email or web address]  Patient seen and examined  with Darrick Grinder, NP. We discussed all aspects of the encounter. I agree with the assessment and plan as stated above.   Visceral angio negative for active bleed yesterday. Diverticular bleed seems to have slowed. Groin site stable. MAPs and VAD parameters stable. HF stable. Will continue watchful waiting. Hold warfarin one more day. Appreciate help from GI and IR. Mobilize.  Calogero Geisen,MD 9:09 PM

## 2016-09-26 LAB — BASIC METABOLIC PANEL
Anion gap: 8 (ref 5–15)
BUN: 8 mg/dL (ref 6–20)
CO2: 24 mmol/L (ref 22–32)
Calcium: 9.1 mg/dL (ref 8.9–10.3)
Chloride: 108 mmol/L (ref 101–111)
Creatinine, Ser: 0.95 mg/dL (ref 0.44–1.00)
GFR calc Af Amer: >60 mL/min (ref >60)
GFR calc non Af Amer: 55 mL/min — ABNORMAL LOW (ref >60)
Glucose, Bld: 88 mg/dL (ref 65–99)
Potassium: 3.9 mmol/L (ref 3.5–5.1)
Sodium: 140 mmol/L (ref 135–145)

## 2016-09-26 LAB — CBC WITH DIFFERENTIAL/PLATELET
Basophils Absolute: 0 10*3/uL (ref 0.0–0.1)
Basophils Relative: 0 %
Eosinophils Absolute: 0.2 10*3/uL (ref 0.0–0.7)
Eosinophils Relative: 3 %
HCT: 31 % — ABNORMAL LOW (ref 36.0–46.0)
Hemoglobin: 10.1 g/dL — ABNORMAL LOW (ref 12.0–15.0)
Lymphocytes Relative: 30 %
Lymphs Abs: 1.8 10*3/uL (ref 0.7–4.0)
MCH: 29.3 pg (ref 26.0–34.0)
MCHC: 32.6 g/dL (ref 30.0–36.0)
MCV: 89.9 fL (ref 78.0–100.0)
Monocytes Absolute: 0.5 10*3/uL (ref 0.1–1.0)
Monocytes Relative: 9 %
Neutro Abs: 3.6 10*3/uL (ref 1.7–7.7)
Neutrophils Relative %: 58 %
Platelets: 163 10*3/uL (ref 150–400)
RBC: 3.45 MIL/uL — ABNORMAL LOW (ref 3.87–5.11)
RDW: 15.5 % (ref 11.5–15.5)
WBC: 6.1 10*3/uL (ref 4.0–10.5)

## 2016-09-26 LAB — PROTIME-INR
INR: 2.02
Prothrombin Time: 23.1 seconds — ABNORMAL HIGH (ref 11.4–15.2)

## 2016-09-26 LAB — LACTATE DEHYDROGENASE: LDH: 210 U/L — ABNORMAL HIGH (ref 98–192)

## 2016-09-26 MED ORDER — CARVEDILOL 6.25 MG PO TABS: 6.2500 mg | ORAL_TABLET | Freq: Two times a day (BID) | ORAL | Status: DC

## 2016-09-26 MED ORDER — HYDRALAZINE HCL 25 MG PO TABS
25.0000 mg | ORAL_TABLET | Freq: Once | ORAL | Status: AC
  Administered 2016-09-26: 25 mg via ORAL
  Filled 2016-09-26: qty 1

## 2016-09-26 MED ORDER — WARFARIN SODIUM 5 MG PO TABS
5.0000 mg | ORAL_TABLET | Freq: Once | ORAL | Status: AC
  Administered 2016-09-26: 5 mg via ORAL
  Filled 2016-09-26: qty 1

## 2016-09-26 MED ORDER — WARFARIN - PHARMACIST DOSING INPATIENT
Freq: Every day | Status: DC
  Administered 2016-09-26 – 2016-09-29 (×4)

## 2016-09-26 MED ORDER — CARVEDILOL 3.125 MG PO TABS
3.1250 mg | ORAL_TABLET | Freq: Two times a day (BID) | ORAL | Status: DC
  Administered 2016-09-26 – 2016-09-27 (×3): 3.125 mg via ORAL
  Filled 2016-09-26 (×3): qty 1

## 2016-09-26 MED ORDER — DOCUSATE SODIUM 100 MG PO CAPS
100.0000 mg | ORAL_CAPSULE | Freq: Two times a day (BID) | ORAL | Status: DC
  Administered 2016-09-26 – 2016-09-30 (×9): 100 mg via ORAL
  Filled 2016-09-26 (×9): qty 1

## 2016-09-26 NOTE — Progress Notes (Signed)
ANTICOAGULATION CONSULT NOTE - Follow Up Consult  Pharmacy Consult for Coumadin Indication: LVAD  No Known Allergies  Patient Measurements: Height: 5' (152.4 cm) Weight: 170 lb 9.6 oz (77.4 kg) IBW/kg (Calculated) : 45.5  Vital Signs: Temp: 98.1 F (36.7 C) (01/25 1100) Temp Source: Oral (01/25 1100) BP: 125/88 (01/25 0904) Pulse Rate: 77 (01/25 1100)  Labs:  Recent Labs  09/24/16 0308 09/24/16 1410 09/25/16 0151 09/26/16 0229  HGB 10.3*  --  9.8* 10.1*  HCT 31.1*  --  29.6* 31.0*  PLT 153  --  145* 163  LABPROT 31.2* 30.1* 28.7* 23.1*  INR 2.94 2.81 2.65 2.02  CREATININE 0.83  --  0.98 0.95    Estimated Creatinine Clearance: 44.2 mL/min (by C-G formula based on SCr of 0.95 mg/dL).  Assessment: 81yof on coumadin pta for LVAD, admitted with rectal bleeding. INR 3 on admit, she got one dose of coumadin 1/20 and then it was held. Received FFP 1/21 and 1/22. Underwent tagged RBC scan which showed bleeding, however mesenteric angiogram was negative. INR down to 2 today. Diverticular bleed seems to be slowing. Pharmacy asked to resume coumadin slowly, with new INR goal 1.8-2.3. Hgb stable.  Home dose: 5mg  daily  Goal of Therapy:  INR 1.8-2.3 Monitor platelets by anticoagulation protocol: Yes   Plan:  1) Coumadin 5mg  x 1 2) Daily INR, CBC 3) Watch for recurrent bleeding  Deboraha Sprang 09/26/2016,11:59 AM

## 2016-09-26 NOTE — Progress Notes (Signed)
HeartMate 2 Rounding Note  Subjective:    Tagged RBC bleeding rectosigmoid. Due to active bleeding she received FFP, fluid bolus, and LVAD speed was cut back.   S/P mesenteric angiography --> no source of bleeding.   Overnight had a BM and some BRB on toilet paper after. Otherwise no bleeding. No ab pain. Hgb stable   Denies SOB/dizziness.    INR 2.0   LVAD INTERROGATION:  HeartMate II LVAD:  Flow 3.9liters/min, speed 8600, power 4,  PI 7.2.   8 low flows Objective:    Vital Signs:   Temp:  [97.8 F (36.6 C)-98.6 F (37 C)] 98.2 F (36.8 C) (01/25 0433) Pulse Rate:  [67-82] 82 (01/25 0430) Resp:  [16-19] 19 (01/25 0433) BP: (103-129)/(80-103) 129/87 (01/25 0635) SpO2:  [96 %-99 %] 98 % (01/25 0430) Weight:  [170 lb 9.6 oz (77.4 kg)] 170 lb 9.6 oz (77.4 kg) (01/25 0433) Last BM Date: 09/24/16 Mean arterial Pressure 90-100s  Intake/Output:   Intake/Output Summary (Last 24 hours) at 09/26/16 0749 Last data filed at 09/25/16 1200  Gross per 24 hour  Intake              300 ml  Output                0 ml  Net              300 ml     Physical Exam: General:  Elderly.  In bed. NAD.   HEENT: Normal  Neck: supple. JVP 7 cm. Carotids 2+ bilat; no bruits. No thyromegaly or nodule noted  Cor: Mechanical heart sounds with LVAD hum present. Lungs: CTAB, normal effort Abdomen: soft, nontender, ND, no HSM. No bruits or masses. +BS  Driveline: C/D/I; securement device intact and driveline incorporated Extremities: no cyanosis, clubbing, rash. Trace edema.  R groin site stable. No hematoma Neuro: alert & orientedx3, cranial nerves grossly intact. moves all 4 extremities w/o difficulty. Affect pleasant  Telemetry: Reviewed, NSR  Labs: Basic Metabolic Panel:  Recent Labs Lab 09/22/16 0305 09/23/16 0309 09/24/16 0308 09/25/16 0151 09/26/16 0229  NA 137 138 140 139 140  K 3.1* 4.1 3.6 3.4* 3.9  CL 112* 116* 115* 110 108  CO2 18* 18* 20* 20* 24  GLUCOSE 81 83 78 75 88   BUN 11 <5* <5* <5* 8  CREATININE 0.97 0.87 0.83 0.98 0.95  CALCIUM 8.4* 8.2* 8.5* 8.6* 9.1    Liver Function Tests:  Recent Labs Lab 09/21/16 0949  AST 28  ALT 22  ALKPHOS 84  BILITOT 0.6  PROT 8.4*  ALBUMIN 3.2*   No results for input(s): LIPASE, AMYLASE in the last 168 hours. No results for input(s): AMMONIA in the last 168 hours.  CBC:  Recent Labs Lab 09/22/16 0305  09/23/16 0309 09/23/16 1102 09/24/16 0308 09/25/16 0151 09/26/16 0229  WBC 5.1  < > 4.8 4.6 4.5 4.8 6.1  NEUTROABS 2.5  --  2.4  --  1.7 2.4 3.6  HGB 11.4*  < > 10.1* 10.3* 10.3* 9.8* 10.1*  HCT 35.9*  < > 31.1* 32.2* 31.1* 29.6* 31.0*  MCV 91.6  < > 91.7 91.7 90.1 90.2 89.9  PLT 155  < > 147* 155 153 145* 163  < > = values in this interval not displayed.  INR:  Recent Labs Lab 09/23/16 0309 09/24/16 0308 09/24/16 1410 09/25/16 0151 09/26/16 0229  INR 2.65 2.94 2.81 2.65 2.02    Other results:    Imaging:  Ir Angiogram Visceral Selective  Result Date: 09/24/2016 INDICATION: Acute intermittent lower GI bleeding localized to the rectosigmoid colon by nuclear medicine imaging EXAM: SELECTIVE VISCERAL ARTERIOGRAPHY; IR ULTRASOUND GUIDANCE VASC ACCESS RIGHT MEDICATIONS: None. ANESTHESIA/SEDATION: A total of Versed 0 mg and Fentanyl 25 mcg was administered intravenously. Moderate Sedation Time: 0 minutes. [name] level of consciousness and vital signs were monitored continuously by radiology nursing throughout the procedure under my direct supervision. CONTRAST:  86 cc Isovue 300 FLUOROSCOPY TIME:  Fluoroscopy Time: 5 minutes 36 seconds (206 mGy). COMPLICATIONS: None immediate. PROCEDURE: Informed consent was obtained from the patient following explanation of the procedure, risks, benefits and alternatives. The patient understands, agrees and consents for the procedure. All questions were addressed. A time out was performed prior to the initiation of the procedure. Maximal barrier sterile  technique utilized including caps, mask, sterile gowns, sterile gloves, large sterile drape, hand hygiene, and Betadine prep. Previous CT reviewed. Under sterile conditions and local anesthesia, ultrasound micropuncture access performed of the right common femoral artery. Five French sheath inserted over guidewire. C2 catheter utilized to select the SMA. Selective SMA angiogram performed. SMA: SMA is widely patent. Jejunal and colonic branches are patent. No evidence of contrast extravasation or active bleeding. On the venous phase the mesenteric and portal veins are patent. Catheter exchanged for a sos Omni select catheter. This was utilized to select the IMA. IMA angiograms performed. IMA: IMA is widely patent. Left colic and superior rectal branches are patent. No visualized active contrast extravasation or bleeding. On the venous phase, the inferior mesenteric vein is patent. Catheter access removed. 5 French right common femoral artery sheath secured with pressurized saline. Patient's current INR is 2.94. IMPRESSION: Patent SMA and IMA without evidence of active contrast extravasation or bleeding. Electronically Signed   By: Jerilynn Mages.  Shick M.D.   On: 09/24/2016 14:00   Ir Angiogram Visceral Selective  Result Date: 09/24/2016 INDICATION: Acute intermittent lower GI bleeding localized to the rectosigmoid colon by nuclear medicine imaging EXAM: SELECTIVE VISCERAL ARTERIOGRAPHY; IR ULTRASOUND GUIDANCE VASC ACCESS RIGHT MEDICATIONS: None. ANESTHESIA/SEDATION: A total of Versed 0 mg and Fentanyl 25 mcg was administered intravenously. Moderate Sedation Time: 0 minutes. [name] level of consciousness and vital signs were monitored continuously by radiology nursing throughout the procedure under my direct supervision. CONTRAST:  86 cc Isovue 300 FLUOROSCOPY TIME:  Fluoroscopy Time: 5 minutes 36 seconds (206 mGy). COMPLICATIONS: None immediate. PROCEDURE: Informed consent was obtained from the patient following  explanation of the procedure, risks, benefits and alternatives. The patient understands, agrees and consents for the procedure. All questions were addressed. A time out was performed prior to the initiation of the procedure. Maximal barrier sterile technique utilized including caps, mask, sterile gowns, sterile gloves, large sterile drape, hand hygiene, and Betadine prep. Previous CT reviewed. Under sterile conditions and local anesthesia, ultrasound micropuncture access performed of the right common femoral artery. Five French sheath inserted over guidewire. C2 catheter utilized to select the SMA. Selective SMA angiogram performed. SMA: SMA is widely patent. Jejunal and colonic branches are patent. No evidence of contrast extravasation or active bleeding. On the venous phase the mesenteric and portal veins are patent. Catheter exchanged for a sos Omni select catheter. This was utilized to select the IMA. IMA angiograms performed. IMA: IMA is widely patent. Left colic and superior rectal branches are patent. No visualized active contrast extravasation or bleeding. On the venous phase, the inferior mesenteric vein is patent. Catheter access removed. 5 French right common femoral  artery sheath secured with pressurized saline. Patient's current INR is 2.94. IMPRESSION: Patent SMA and IMA without evidence of active contrast extravasation or bleeding. Electronically Signed   By: Jerilynn Mages.  Shick M.D.   On: 09/24/2016 14:00   Ir US Guide Vasc Access Right  Result Date: 09/24/2016 INDICATION: Acute intermittent lower GI bleeding localized to the rectosigmoid colon by nuclear medicine imaging EXAM: SELECTIVE VISCERAL ARTERIOGRAPHY; IR ULTRASOUND GUIDANCE VASC ACCESS RIGHT MEDICATIONS: None. ANESTHESIA/SEDATION: A total of Versed 0 mg and Fentanyl 25 mcg was administered intravenously. Moderate Sedation Time: 0 minutes. [name] level of consciousness and vital signs were monitored continuously by radiology nursing  throughout the procedure under my direct supervision. CONTRAST:  86 cc Isovue 300 FLUOROSCOPY TIME:  Fluoroscopy Time: 5 minutes 36 seconds (206 mGy). COMPLICATIONS: None immediate. PROCEDURE: Informed consent was obtained from the patient following explanation of the procedure, risks, benefits and alternatives. The patient understands, agrees and consents for the procedure. All questions were addressed. A time out was performed prior to the initiation of the procedure. Maximal barrier sterile technique utilized including caps, mask, sterile gowns, sterile gloves, large sterile drape, hand hygiene, and Betadine prep. Previous CT reviewed. Under sterile conditions and local anesthesia, ultrasound micropuncture access performed of the right common femoral artery. Five French sheath inserted over guidewire. C2 catheter utilized to select the SMA. Selective SMA angiogram performed. SMA: SMA is widely patent. Jejunal and colonic branches are patent. No evidence of contrast extravasation or active bleeding. On the venous phase the mesenteric and portal veins are patent. Catheter exchanged for a sos Omni select catheter. This was utilized to select the IMA. IMA angiograms performed. IMA: IMA is widely patent. Left colic and superior rectal branches are patent. No visualized active contrast extravasation or bleeding. On the venous phase, the inferior mesenteric vein is patent. Catheter access removed. 5 French right common femoral artery sheath secured with pressurized saline. Patient's current INR is 2.94. IMPRESSION: Patent SMA and IMA without evidence of active contrast extravasation or bleeding. Electronically Signed   By: Jerilynn Mages.  Shick M.D.   On: 09/24/2016 14:00     Medications:     Scheduled Medications: . amiodarone  100 mg Oral Daily  . carvedilol  3.125 mg Oral BID WC  . gabapentin  100 mg Oral QHS  . hydrALAZINE  75 mg Oral TID  . losartan  100 mg Oral Daily  . pantoprazole  40 mg Oral Daily  .  potassium chloride SA  20 mEq Oral BID    Infusions:   PRN Medications:     Assessment/plan:   1. Nausea/vomiting - likely viral gastroenteritis. Multiple low flow alarms on VAD - 1 low flow yesterday. None over night.    2. Lower GI bleeding - - Tagged scan with active bleed in rectosigmoid. Bleeding slowed with FFP.  --S/P mesenteric arteriogram negative for active bleed.  - 1 bloody BM last night. Hgb stable. INR dropping. Hold coumadin today anticipate restarting tomorrow if no further BMS.  INR 2.0  Hgb  10.1 - Continue PPI.  -Add colace. Once she is back on coumadin INR goal will be 1.8-2.3.  - GI appreciated.  3. Chronic systolic HF s/p HM-II VAD - VAD interrogated personally. 1 low flow yestereday 4. HTN  - Continue Hydral 75 mg TID.  - Continue losartan 100 mg daily.  5. AKI - Improved and stable.  6. Hypokalemia- supplement K     Length of Stay: [place], NP  09/26/2016,  7:49 AM  VAD Team --- VAD ISSUES ONLY--- Pager [phone] (7am - 7am)  Advanced Heart Failure Team  Pager [phone] (M-F; 7a - 4p)  Please contact [facility] for night-coverage after hours (4p -7a ) and weekends on [email or web address]  Patient seen and examined with Darrick Grinder, NP. We discussed all aspects of the encounter. I agree with the assessment and plan as stated above.   Diverticular bleed seems to be slowing. Hgb stable. Resume coumadin slowly. Continue to follow. Volume status ok. VAD parameters and MAPs stable.   Mobilize.   Brentley Landfair,MD 9:38 AM

## 2016-09-27 LAB — CBC WITH DIFFERENTIAL/PLATELET
Basophils Absolute: 0.1 10*3/uL (ref 0.0–0.1)
Basophils Relative: 1 %
Eosinophils Absolute: 0.2 10*3/uL (ref 0.0–0.7)
Eosinophils Relative: 3 %
HCT: 31.3 % — ABNORMAL LOW (ref 36.0–46.0)
Hemoglobin: 10.3 g/dL — ABNORMAL LOW (ref 12.0–15.0)
Lymphocytes Relative: 30 %
Lymphs Abs: 2 10*3/uL (ref 0.7–4.0)
MCH: 29.6 pg (ref 26.0–34.0)
MCHC: 32.9 g/dL (ref 30.0–36.0)
MCV: 89.9 fL (ref 78.0–100.0)
Monocytes Absolute: 0.6 10*3/uL (ref 0.1–1.0)
Monocytes Relative: 9 %
Neutro Abs: 3.6 10*3/uL (ref 1.7–7.7)
Neutrophils Relative %: 57 %
Platelets: 198 10*3/uL (ref 150–400)
RBC: 3.48 MIL/uL — ABNORMAL LOW (ref 3.87–5.11)
RDW: 15.7 % — ABNORMAL HIGH (ref 11.5–15.5)
WBC: 6.5 10*3/uL (ref 4.0–10.5)

## 2016-09-27 LAB — PROTIME-INR
INR: 1.51
Prothrombin Time: 18.4 seconds — ABNORMAL HIGH (ref 11.4–15.2)

## 2016-09-27 LAB — LACTATE DEHYDROGENASE: LDH: 229 U/L — ABNORMAL HIGH (ref 98–192)

## 2016-09-27 MED ORDER — CARVEDILOL 6.25 MG PO TABS
6.2500 mg | ORAL_TABLET | Freq: Two times a day (BID) | ORAL | Status: DC
  Administered 2016-09-27 – 2016-09-30 (×7): 6.25 mg via ORAL
  Filled 2016-09-27 (×7): qty 1

## 2016-09-27 MED ORDER — WARFARIN SODIUM 7.5 MG PO TABS
7.5000 mg | ORAL_TABLET | Freq: Once | ORAL | Status: AC
  Administered 2016-09-27: 7.5 mg via ORAL
  Filled 2016-09-27: qty 1

## 2016-09-27 MED ORDER — WARFARIN SODIUM 5 MG PO TABS: 5.0000 mg | ORAL_TABLET | Freq: Once | ORAL | Status: DC

## 2016-09-27 NOTE — Plan of Care (Signed)
Problem: Education: Goal: Knowledge of the prescribed therapeutic regimen will improve Outcome: Adequate for Discharge Patient requires no further education regarding LVAD equipment and daily requirements of equipment care. Caregiver also without need for further education.

## 2016-09-27 NOTE — Progress Notes (Signed)
Pt. dopplar MAP 125-130 at 0400.  Cardiology paged and ordered to give morning hydralazine and coreg early.  Will give and continue to monitor.

## 2016-09-27 NOTE — Progress Notes (Signed)
VAD alarm pump flow dropped to 2.4 and 2.1. Pt asymptomatic. Notified Amy Clegg of alarms. Zarian Colpitts 09/27/2016 @ 09.47

## 2016-09-27 NOTE — Progress Notes (Signed)
ANTICOAGULATION CONSULT NOTE - Follow Up Consult  Pharmacy Consult for Coumadin Indication: LVAD  No Known Allergies  Patient Measurements: Height: 5' (152.4 cm) Weight: 168 lb 1.6 oz (76.2 kg) IBW/kg (Calculated) : 45.5  Vital Signs: Temp: 98.3 F (36.8 C) (01/26 0400) Temp Source: Oral (01/26 0400) BP: 113/82 (01/26 0400) Pulse Rate: 80 (01/26 0400)  Labs:  Recent Labs  09/25/16 0151 09/26/16 0229 09/27/16 0251  HGB 9.8* 10.1* 10.3*  HCT 29.6* 31.0* 31.3*  PLT 145* 163 198  LABPROT 28.7* 23.1* 18.4*  INR 2.65 2.02 1.51  CREATININE 0.98 0.95  --     Estimated Creatinine Clearance: 43.8 mL/min (by C-G formula based on SCr of 0.95 mg/dL).  Assessment: 81yof on coumadin pta for LVAD, admitted with rectal bleeding. INR 3 on admit, she got one dose of coumadin 1/20 and then it was held. Received FFP 1/21 and 1/22. Underwent tagged RBC scan which showed bleeding, however mesenteric angiogram was negative. INR down to 1.5 today. Diverticular bleed seems to be slowing. No BMs overnight.  Pharmacy asked to resume coumadin slowly which was done yesterday, with new INR goal 1.8-2.3. Hgb stable. No plans for heparin bridge at this time.   Home dose: 5mg  daily  Goal of Therapy:  INR 1.8-2.3 Monitor platelets by anticoagulation protocol: Yes   Plan:  1) Repeat coumadin 5mg  x 1 2) Daily INR, CBC 3) Watch for recurrent bleeding  Erin Hearing PharmD., BCPS Clinical Pharmacist Pager [phone] 09/27/2016 7:58 AM

## 2016-09-27 NOTE — Progress Notes (Addendum)
VAD Alarm pump flow low. Dropped to 2.4 now back at 3.2.  Alexanderjames Berg 09/27/2016 @ 1700. Spoke with Colletta [place] VAD Team to inform her of low flow alarm. She said if it starts happening more often or if patient becomes symptomatic to cal her back. She is notifying the medical team. Hal Neer RN 09/27/2016 @ 17.30

## 2016-09-27 NOTE — Progress Notes (Signed)
HeartMate 2 Rounding Note  Subjective:    Tagged RBC bleeding rectosigmoid. Due to active bleeding she received FFP, fluid bolus, and LVAD speed was cut back.   S/P mesenteric angiography --> no source of bleeding.   No BMs over night. Feeling much better. Denies SOB.  Stil with occasional BRB on toilet paper after she goes to the bathroom .   INR 1.5   LVAD INTERROGATION:  HeartMate II LVAD:  Flow 3.5 liters/min, speed 8600, power 4,  PI 7.2.   4 low flows over night.  Objective:    Vital Signs:   Temp:  [97.7 F (36.5 C)-98.3 F (36.8 C)] 98.3 F (36.8 C) (01/26 0400) Pulse Rate:  [69-80] 80 (01/26 0400) Resp:  [15-16] 16 (01/26 0400) BP: (113-127)/(73-89) 113/82 (01/26 0400) SpO2:  [94 %-98 %] 98 % (01/26 0400) Weight:  [168 lb 1.6 oz (76.2 kg)] 168 lb 1.6 oz (76.2 kg) (01/26 0500) Last BM Date: 09/24/16 Mean arterial Pressure 90s  Intake/Output:   Intake/Output Summary (Last 24 hours) at 09/27/16 0745 Last data filed at 09/26/16 1623  Gross per 24 hour  Intake              720 ml  Output                0 ml  Net              720 ml     Physical Exam: General:  Elderly.  In bed. NAD.   HEENT: Normal  Neck: supple. JVP 5-6 cm. Carotids 2+ bilat; no bruits. No thyromegaly or nodule noted  Cor: Mechanical heart sounds with LVAD hum present. Lungs: CTAB, normal effort Abdomen: soft, nontender, ND, no HSM. No bruits or masses. +BS  Driveline: C/D/I; securement device intact and driveline incorporated Extremities: no cyanosis, clubbing, rash. No edema.  R groin site stable. No hematoma Neuro: alert & orientedx3, cranial nerves grossly intact. moves all 4 extremities w/o difficulty. Affect pleasant  Telemetry: Reviewed, NSR  Labs: Basic Metabolic Panel:  Recent Labs Lab 09/22/16 0305 09/23/16 0309 09/24/16 0308 09/25/16 0151 09/26/16 0229  NA 137 138 140 139 140  K 3.1* 4.1 3.6 3.4* 3.9  CL 112* 116* 115* 110 108  CO2 18* 18* 20* 20* 24  GLUCOSE 81 83  78 75 88  BUN 11 <5* <5* <5* 8  CREATININE 0.97 0.87 0.83 0.98 0.95  CALCIUM 8.4* 8.2* 8.5* 8.6* 9.1    Liver Function Tests:  Recent Labs Lab 09/21/16 0949  AST 28  ALT 22  ALKPHOS 84  BILITOT 0.6  PROT 8.4*  ALBUMIN 3.2*   No results for input(s): LIPASE, AMYLASE in the last 168 hours. No results for input(s): AMMONIA in the last 168 hours.  CBC:  Recent Labs Lab 09/23/16 0309 09/23/16 1102 09/24/16 0308 09/25/16 0151 09/26/16 0229 09/27/16 0251  WBC 4.8 4.6 4.5 4.8 6.1 6.5  NEUTROABS 2.4  --  1.7 2.4 3.6 3.6  HGB 10.1* 10.3* 10.3* 9.8* 10.1* 10.3*  HCT 31.1* 32.2* 31.1* 29.6* 31.0* 31.3*  MCV 91.7 91.7 90.1 90.2 89.9 89.9  PLT 147* 155 153 145* 163 198    INR:  Recent Labs Lab 09/24/16 0308 09/24/16 1410 09/25/16 0151 09/26/16 0229 09/27/16 0251  INR 2.94 2.81 2.65 2.02 1.51    Other results:    Imaging: No results found.   Medications:     Scheduled Medications: . amiodarone  100 mg Oral Daily  . carvedilol  3.125 mg Oral BID WC  . docusate sodium  100 mg Oral BID  . gabapentin  100 mg Oral QHS  . hydrALAZINE  75 mg Oral TID  . losartan  100 mg Oral Daily  . pantoprazole  40 mg Oral Daily  . potassium chloride SA  20 mEq Oral BID  . Warfarin - Pharmacist Dosing Inpatient   Does not apply q1800    Infusions:   PRN Medications:     Assessment/plan:   1. Nausea/vomiting - likely viral gastroenteritis. Multiple low flow alarms on VAD - 1 low flow yesterday. None over night.    2. Lower GI bleeding - - Tagged scan with active bleed in rectosigmoid. Bleeding slowed with FFP.  --S/P mesenteric arteriogram negative for active bleed.  - No BMs. Back on coumadin. INR down 1.5.  Hgb  10.3.  - Hold off on heparin.  - Continue PPI.  - Continue colace.  INR goal will be 1.8-2.3.  - GI appreciated.  3. Chronic systolic HF s/p HM-II VAD - VAD interrogated personally. 1 low flow yestereday 4. HTN  - Continue Hydral 75 mg TID.  -  Continue losartan 100 mg daily.  - Increase carvedilol to 6.25 mg twice a day. 5. AKI - Improved and stable.  6. Hypokalemia- supplement K   Length of Stay: 6  Amy Clegg, NP  09/27/2016, 7:45 AM  VAD Team --- VAD ISSUES ONLY--- Pager [phone] (7am - 7am)  Advanced Heart Failure Team  Pager [phone] (M-F; 7a - 4p)  Please contact [facility] for night-coverage after hours (4p -7a ) and weekends on [email or web address]   Patient seen and examined with Darrick Grinder, NP. We discussed all aspects of the encounter. I agree with the assessment and plan as stated above.   Bleeding has slowed down considerably. MAPs ok but still with some low flows on VAD. INR 1.5. Back on coumadin. No heparin at this point. Hopefully bleeding won't increase as INR climbs.   Yazir Koerber,MD 11:17 PM

## 2016-09-28 LAB — CBC WITH DIFFERENTIAL/PLATELET
Basophils Absolute: 0 10*3/uL (ref 0.0–0.1)
Basophils Relative: 0 %
Eosinophils Absolute: 0.2 10*3/uL (ref 0.0–0.7)
Eosinophils Relative: 3 %
HCT: 31.9 % — ABNORMAL LOW (ref 36.0–46.0)
Hemoglobin: 10.4 g/dL — ABNORMAL LOW (ref 12.0–15.0)
Lymphocytes Relative: 37 %
Lymphs Abs: 2.4 10*3/uL (ref 0.7–4.0)
MCH: 29.5 pg (ref 26.0–34.0)
MCHC: 32.6 g/dL (ref 30.0–36.0)
MCV: 90.6 fL (ref 78.0–100.0)
Monocytes Absolute: 0.6 10*3/uL (ref 0.1–1.0)
Monocytes Relative: 9 %
Neutro Abs: 3.3 10*3/uL (ref 1.7–7.7)
Neutrophils Relative %: 51 %
Platelets: 235 10*3/uL (ref 150–400)
RBC: 3.52 MIL/uL — ABNORMAL LOW (ref 3.87–5.11)
RDW: 15.9 % — ABNORMAL HIGH (ref 11.5–15.5)
WBC: 6.5 10*3/uL (ref 4.0–10.5)

## 2016-09-28 LAB — PROTIME-INR
INR: 1.49
Prothrombin Time: 18.1 seconds — ABNORMAL HIGH (ref 11.4–15.2)

## 2016-09-28 LAB — LACTATE DEHYDROGENASE: LDH: 226 U/L — ABNORMAL HIGH (ref 98–192)

## 2016-09-28 MED ORDER — WARFARIN SODIUM 5 MG PO TABS
5.0000 mg | ORAL_TABLET | Freq: Once | ORAL | Status: AC
  Administered 2016-09-28: 5 mg via ORAL
  Filled 2016-09-28: qty 1

## 2016-09-28 MED ORDER — SODIUM CHLORIDE 0.9 % IV SOLN
Freq: Once | INTRAVENOUS | Status: AC
  Administered 2016-09-28: 14:00:00 via INTRAVENOUS

## 2016-09-28 NOTE — Progress Notes (Signed)
ANTICOAGULATION CONSULT NOTE - Follow Up Consult  Pharmacy Consult for Coumadin Indication: LVAD  No Known Allergies  Patient Measurements: Height: 5' (152.4 cm) Weight: 171 lb (77.6 kg) IBW/kg (Calculated) : 45.5  Vital Signs: Temp: 98 F (36.7 C) (01/27 0830) Temp Source: Oral (01/27 0830) BP: 125/90 (01/27 0300) Pulse Rate: 81 (01/27 0300)  Labs:  Recent Labs  09/26/16 0229 09/27/16 0251 09/28/16 0506  HGB 10.1* 10.3* 10.4*  HCT 31.0* 31.3* 31.9*  PLT 163 198 235  LABPROT 23.1* 18.4* 18.1*  INR 2.02 1.51 1.49  CREATININE 0.95  --   --     Estimated Creatinine Clearance: 44.2 mL/min (by C-G formula based on SCr of 0.95 mg/dL).  Assessment: 81yof on coumadin pta for LVAD, admitted with rectal bleeding. INR 3 on admit, she got one dose of coumadin 1/20 and then it was held. Received FFP 1/21 and 1/22. Underwent tagged RBC scan which showed bleeding, however mesenteric angiogram was negative. Diverticular bleed seems to be slowing. No BMs overnight.  Coumadin resumed slowly 1/25 with new INR goal 1.8-2.3. INR 1.49 today. Hgb stable. No plans for heparin bridge at this time.   Home dose: 5mg  daily  Goal of Therapy:  INR 1.8-2.3 Monitor platelets by anticoagulation protocol: Yes   Plan:  1) Coumadin 5mg  x 1 2) Daily INR, CBC 3) Watch for recurrent bleeding  Nena Jordan, PharmD, BCPS 09/28/2016 11:28 AM

## 2016-09-28 NOTE — Progress Notes (Signed)
HeartMate 2 Rounding Note  Subjective:    Tagged RBC bleeding rectosigmoid. Due to active bleeding she received FFP, fluid bolus, and LVAD speed was cut back.   S/P mesenteric angiography --> no source of bleeding.   Feels better. Still with minimal BRBPR. Hgb drifitng up. Multiple low flow alarms.   INR 1.5   LVAD INTERROGATION:  HeartMate II LVAD:  Flow 2.7 liters/min, speed 8600, power 4.2,  PI 6.4   Innumerable low flows this am  Objective:    Vital Signs:   Temp:  [97.5 F (36.4 C)-98.4 F (36.9 C)] 98 F (36.7 C) (01/27 0830) Pulse Rate:  [79-81] 81 (01/27 0300) BP: (98-125)/(72-90) 125/90 (01/27 0300) SpO2:  [97 %-98 %] 97 % (01/27 0830) Weight:  [77.6 kg (171 lb)] 77.6 kg (171 lb) (01/27 0300) Last BM Date: 09/24/16 Mean arterial Pressure 80-90s  Intake/Output:   Intake/Output Summary (Last 24 hours) at 09/28/16 1026 Last data filed at 09/28/16 0900  Gross per 24 hour  Intake              360 ml  Output                0 ml  Net              360 ml     Physical Exam: General:  Sitting in chair NAD HEENT: Normal  Neck: supple. JVP falt. Carotids 2+ bilat; no bruits. No thyromegaly or nodule noted  Cor: Mechanical heart sounds with LVAD hum present. Lungs: CTAB, normal effort Abdomen: soft, nontender, ND, no HSM. No bruits or masses. +BS  Driveline: C/D/I; securement device intact and driveline incorporated Extremities: no cyanosis, clubbing, rash. No edema.  Neuro: alert & orientedx3, cranial nerves grossly intact. moves all 4 extremities w/o difficulty. Affect pleasant  Telemetry: Reviewed, NSR  Labs: Basic Metabolic Panel:  Recent Labs Lab 09/22/16 0305 09/23/16 0309 09/24/16 0308 09/25/16 0151 09/26/16 0229  NA 137 138 140 139 140  K 3.1* 4.1 3.6 3.4* 3.9  CL 112* 116* 115* 110 108  CO2 18* 18* 20* 20* 24  GLUCOSE 81 83 78 75 88  BUN 11 <5* <5* <5* 8  CREATININE 0.97 0.87 0.83 0.98 0.95  CALCIUM 8.4* 8.2* 8.5* 8.6* 9.1    Liver  Function Tests: No results for input(s): AST, ALT, ALKPHOS, BILITOT, PROT, ALBUMIN in the last 168 hours. No results for input(s): LIPASE, AMYLASE in the last 168 hours. No results for input(s): AMMONIA in the last 168 hours.  CBC:  Recent Labs Lab 09/24/16 0308 09/25/16 0151 09/26/16 0229 09/27/16 0251 09/28/16 0506  WBC 4.5 4.8 6.1 6.5 6.5  NEUTROABS 1.7 2.4 3.6 3.6 3.3  HGB 10.3* 9.8* 10.1* 10.3* 10.4*  HCT 31.1* 29.6* 31.0* 31.3* 31.9*  MCV 90.1 90.2 89.9 89.9 90.6  PLT 153 145* 163 198 235    INR:  Recent Labs Lab 09/24/16 1410 09/25/16 0151 09/26/16 0229 09/27/16 0251 09/28/16 0506  INR 2.81 2.65 2.02 1.51 1.49    Other results:    Imaging: No results found.   Medications:     Scheduled Medications: . amiodarone  100 mg Oral Daily  . carvedilol  6.25 mg Oral BID WC  . docusate sodium  100 mg Oral BID  . gabapentin  100 mg Oral QHS  . hydrALAZINE  75 mg Oral TID  . losartan  100 mg Oral Daily  . pantoprazole  40 mg Oral Daily  . potassium chloride SA  20 mEq Oral BID  . Warfarin - Pharmacist Dosing Inpatient   Does not apply q1800    Infusions:   PRN Medications:     Assessment/plan:   1. Nausea/vomiting - likely viral gastroenteritis.  - resolved 2. Lower GI bleeding - - Tagged scan with active bleed in rectosigmoid. Bleeding slowed with FFP.  --S/P mesenteric arteriogram negative for active bleed.  - No BMs. Back on coumadin. INR down 1.5.  Hgb  10.4.  - Hold off on heparin.  - Continue PPI.  - Continue colace.  INR goal will be 1.8-2.3.  - GI appreciated.  3. Chronic systolic HF s/p HM-II VAD - VAD interrogated personally. Multiple low flow alarms. Speed already turned down to 8600. Give IVF. Check echo  4. HTN  - BP improved - Continue Hydral 75 mg TID.  - Continue losartan 100 mg daily.  - Continue carvedilol 6.15 bid 5. AKI - Improved and stable.  6. Hypokalemia- supplement K   Overall stable but multiple low flow  alarms on VAD. Will give IVF. Check echo tomorrow.   Length of Stay: 7  Glori Bickers, MD  09/28/2016, 10:26 AM  VAD Team --- VAD ISSUES ONLY--- Pager [phone] (7am - 7am)  Advanced Heart Failure Team  Pager [phone] (M-F; 7a - 4p)  Please contact [facility] for night-coverage after hours (4p -7a ) and weekends on [email or web address]

## 2016-09-29 ENCOUNTER — Inpatient Hospital Stay (HOSPITAL_COMMUNITY): Payer: Medicare Other

## 2016-09-29 LAB — CBC WITH DIFFERENTIAL/PLATELET
Basophils Absolute: 0 10*3/uL (ref 0.0–0.1)
Basophils Relative: 0 %
Eosinophils Absolute: 0.2 10*3/uL (ref 0.0–0.7)
Eosinophils Relative: 3 %
HCT: 30.3 % — ABNORMAL LOW (ref 36.0–46.0)
Hemoglobin: 9.6 g/dL — ABNORMAL LOW (ref 12.0–15.0)
Lymphocytes Relative: 31 %
Lymphs Abs: 1.9 10*3/uL (ref 0.7–4.0)
MCH: 28.8 pg (ref 26.0–34.0)
MCHC: 31.7 g/dL (ref 30.0–36.0)
MCV: 91 fL (ref 78.0–100.0)
Monocytes Absolute: 0.7 10*3/uL (ref 0.1–1.0)
Monocytes Relative: 11 %
Neutro Abs: 3.4 10*3/uL (ref 1.7–7.7)
Neutrophils Relative %: 55 %
Platelets: 219 10*3/uL (ref 150–400)
RBC: 3.33 MIL/uL — ABNORMAL LOW (ref 3.87–5.11)
RDW: 16.2 % — ABNORMAL HIGH (ref 11.5–15.5)
WBC: 6.2 10*3/uL (ref 4.0–10.5)

## 2016-09-29 LAB — PROTIME-INR
INR: 1.65
Prothrombin Time: 19.7 seconds — ABNORMAL HIGH (ref 11.4–15.2)

## 2016-09-29 LAB — ECHOCARDIOGRAM LIMITED
Height: 60 in
Weight: 2715.2 oz

## 2016-09-29 LAB — LACTATE DEHYDROGENASE: LDH: 220 U/L — ABNORMAL HIGH (ref 98–192)

## 2016-09-29 MED ORDER — WARFARIN SODIUM 2 MG PO TABS: 4.0000 mg | ORAL_TABLET | Freq: Once | ORAL | Status: DC

## 2016-09-29 MED ORDER — WARFARIN SODIUM 5 MG PO TABS
5.0000 mg | ORAL_TABLET | Freq: Once | ORAL | Status: AC
  Administered 2016-09-29: 5 mg via ORAL
  Filled 2016-09-29: qty 1

## 2016-09-29 MED ORDER — SODIUM CHLORIDE 0.9 % IV BOLUS (SEPSIS)
1000.0000 mL | Freq: Once | INTRAVENOUS | Status: AC
  Administered 2016-09-29: 1000 mL via INTRAVENOUS

## 2016-09-29 NOTE — Progress Notes (Signed)
HeartMate 2 Rounding Note  Subjective:    Tagged RBC bleeding rectosigmoid. Due to active bleeding she received FFP, fluid bolus, and LVAD speed was cut back.   S/P mesenteric angiography --> no source of bleeding.   Feels better. Still with some blood on toilet paper after BM but no other bleeding. Hgb stable. Received 500cc NS yesterday for low flows. Was ok overnight but more low flows this am. MAPs 80-90s. Good po intake  INR 1.65   LVAD INTERROGATION:  HeartMate II LVAD:  Flow 2.6 liters/min, speed 8600, power 3.7,  PI 6.3  Several low flows this am  Objective:    Vital Signs:   Temp:  [97.6 F (36.4 C)-98.6 F (37 C)] 98 F (36.7 C) (01/28 0800) Pulse Rate:  [65-76] 76 (01/27 2300) Resp:  [16-17] 16 (01/27 1615) BP: (91-110)/(72-99) 103/83 (01/28 0300) SpO2:  [96 %-98 %] 97 % (01/27 2300) Weight:  [77 kg (169 lb 11.2 oz)] 77 kg (169 lb 11.2 oz) (01/28 0300) Last BM Date: 09/24/16 Mean arterial Pressure 80-90s  Intake/Output:   Intake/Output Summary (Last 24 hours) at 09/29/16 1159 Last data filed at 09/29/16 0741  Gross per 24 hour  Intake             1720 ml  Output                0 ml  Net             1720 ml     Physical Exam: General:  Sitting in chair NAD HEENT: Normal  Neck: supple. JVP falt. Carotids 2+ bilat; no bruits. No thyromegaly or nodule noted  Cor: Mechanical heart sounds with LVAD hum present. Lungs: CTAB, normal effort Abdomen: soft, nontender, ND, no HSM. No bruits or masses. +BS  Driveline: C/D/I; securement device intact and driveline incorporated Extremities: no cyanosis, clubbing, rash. No edema.  Neuro: alert & orientedx3, cranial nerves grossly intact. moves all 4 extremities w/o difficulty. Affect pleasant  Telemetry: Reviewed, NSR  Labs: Basic Metabolic Panel:  Recent Labs Lab 09/23/16 0309 09/24/16 0308 09/25/16 0151 09/26/16 0229  NA 138 140 139 140  K 4.1 3.6 3.4* 3.9  CL 116* 115* 110 108  CO2 18* 20* 20* 24    GLUCOSE 83 78 75 88  BUN <5* <5* <5* 8  CREATININE 0.87 0.83 0.98 0.95  CALCIUM 8.2* 8.5* 8.6* 9.1    Liver Function Tests: No results for input(s): AST, ALT, ALKPHOS, BILITOT, PROT, ALBUMIN in the last 168 hours. No results for input(s): LIPASE, AMYLASE in the last 168 hours. No results for input(s): AMMONIA in the last 168 hours.  CBC:  Recent Labs Lab 09/25/16 0151 09/26/16 0229 09/27/16 0251 09/28/16 0506 09/29/16 0309  WBC 4.8 6.1 6.5 6.5 6.2  NEUTROABS 2.4 3.6 3.6 3.3 3.4  HGB 9.8* 10.1* 10.3* 10.4* 9.6*  HCT 29.6* 31.0* 31.3* 31.9* 30.3*  MCV 90.2 89.9 89.9 90.6 91.0  PLT 145* 163 198 235 219    INR:  Recent Labs Lab 09/25/16 0151 09/26/16 0229 09/27/16 0251 09/28/16 0506 09/29/16 0309  INR 2.65 2.02 1.51 1.49 1.65    Other results:    Imaging: No results found.   Medications:     Scheduled Medications: . amiodarone  100 mg Oral Daily  . carvedilol  6.25 mg Oral BID WC  . docusate sodium  100 mg Oral BID  . gabapentin  100 mg Oral QHS  . hydrALAZINE  75 mg Oral TID  .  losartan  100 mg Oral Daily  . pantoprazole  40 mg Oral Daily  . potassium chloride SA  20 mEq Oral BID  . warfarin  5 mg Oral ONCE-1800  . Warfarin - Pharmacist Dosing Inpatient   Does not apply q1800    Infusions:   PRN Medications:     Assessment/plan:   1. Nausea/vomiting - likely viral gastroenteritis.  - resolved 2. Lower GI bleeding - - Tagged scan with active bleed in rectosigmoid. Bleeding slowed with FFP.  --S/P mesenteric arteriogram negative for active bleed.  - No BMs. Back on coumadin. INR down 1.65.  Hgb  9.6 - Continue PPI.  - Continue colace.  INR goal will be 1.8-2.3.  - GI appreciated.  3. Chronic systolic HF s/p HM-II VAD - VAD interrogated personally. Ongoing low flow alarms despite IVF. Speed already turned down to 8600. I have paged echo team. Will do bedside echo this am to evaluate pump.  4. HTN  - BP improved - Continue Hydral 75 mg  TID.  - Continue losartan 100 mg daily.  - Continue carvedilol 6.15 bid 5. AKI - Improved and stable.  6. Hypokalemia- improved  Ramp echo performed at bedside with echo tech. Speed adjusted personally from 8600 to 8400. Aortic valve opens minimally with almost all beats. RV mild HK. LV small with apical cannula pointed toward septum. Will continue to watch overnight.   Length of Stay: 8  Glori Bickers, MD  09/29/2016, 11:59 AM  VAD Team --- VAD ISSUES ONLY--- Pager [phone] (7am - 7am)  Advanced Heart Failure Team  Pager [phone] (M-F; 7a - 4p)  Please contact [facility] for night-coverage after hours (4p -7a ) and weekends on [email or web address]

## 2016-09-29 NOTE — Progress Notes (Signed)
ANTICOAGULATION CONSULT NOTE - Follow Up Consult  Pharmacy Consult for Coumadin Indication: LVAD  No Known Allergies  Patient Measurements: Height: 5' (152.4 cm) Weight: 169 lb 11.2 oz (77 kg) IBW/kg (Calculated) : 45.5  Vital Signs: Temp: 98 F (36.7 C) (01/28 0800) Temp Source: Oral (01/28 0800) BP: 103/83 (01/28 0300) Pulse Rate: 76 (01/27 2300)  Labs:  Recent Labs  09/27/16 0251 09/28/16 0506 09/29/16 0309  HGB 10.3* 10.4* 9.6*  HCT 31.3* 31.9* 30.3*  PLT 198 235 219  LABPROT 18.4* 18.1* 19.7*  INR 1.51 1.49 1.65    Estimated Creatinine Clearance: 44 mL/min (by C-G formula based on SCr of 0.95 mg/dL).  Assessment: 81yof on coumadin pta for LVAD, admitted with rectal bleeding. INR 3 on admit, she got one dose of coumadin 1/20 and then it was held. Received FFP 1/21 and 1/22. Underwent tagged RBC scan which showed bleeding, however mesenteric angiogram was negative.  Coumadin resumed slowly 1/25 with new INR goal 1.8-2.3. INR 1.65 today. Patient did receive one dose of 7.5mg  on 1/26 and I expect we will see the impact of this soon. If continues to trend up tomorrow, will likely need to decrease dose so that we dont get outside of new goal. Hgb stable. No plans for heparin bridge at this time.   Home dose: 5mg  daily  Goal of Therapy:  INR 1.8-2.3 Monitor platelets by anticoagulation protocol: Yes   Plan:  1) Coumadin 5mg  x 1 2) Daily INR, CBC 3) Watch for recurrent bleeding  Melburn Popper, PharmD Clinical Pharmacy Resident Pager: [phone] 09/29/16 9:16 AM

## 2016-09-29 NOTE — Progress Notes (Signed)
  Echocardiogram 2D Echocardiogram limited for RAMP has been performed.  Bobbye Charleston 09/29/2016, 12:37 PM

## 2016-09-29 NOTE — Progress Notes (Signed)
Pt having low flow alarms on her LVAD.  MD made aware.  Orders received to give 1L NS.   Pt asymptomatic.  Emotional support given.  Will continue to monitor closely and update as needed.

## 2016-09-30 LAB — CBC WITH DIFFERENTIAL/PLATELET
Basophils Absolute: 0 10*3/uL (ref 0.0–0.1)
Basophils Relative: 0 %
Eosinophils Absolute: 0.2 10*3/uL (ref 0.0–0.7)
Eosinophils Relative: 3 %
HCT: 30.6 % — ABNORMAL LOW (ref 36.0–46.0)
Hemoglobin: 9.7 g/dL — ABNORMAL LOW (ref 12.0–15.0)
Lymphocytes Relative: 33 %
Lymphs Abs: 1.9 10*3/uL (ref 0.7–4.0)
MCH: 29.1 pg (ref 26.0–34.0)
MCHC: 31.7 g/dL (ref 30.0–36.0)
MCV: 91.9 fL (ref 78.0–100.0)
Monocytes Absolute: 0.6 10*3/uL (ref 0.1–1.0)
Monocytes Relative: 11 %
Neutro Abs: 3 10*3/uL (ref 1.7–7.7)
Neutrophils Relative %: 53 %
Platelets: 230 10*3/uL (ref 150–400)
RBC: 3.33 MIL/uL — ABNORMAL LOW (ref 3.87–5.11)
RDW: 16.2 % — ABNORMAL HIGH (ref 11.5–15.5)
WBC: 5.7 10*3/uL (ref 4.0–10.5)

## 2016-09-30 LAB — PROTIME-INR
INR: 1.82
Prothrombin Time: 21.3 seconds — ABNORMAL HIGH (ref 11.4–15.2)

## 2016-09-30 LAB — LACTATE DEHYDROGENASE: LDH: 212 U/L — ABNORMAL HIGH (ref 98–192)

## 2016-09-30 MED ORDER — WARFARIN SODIUM 2.5 MG PO TABS: ORAL_TABLET | ORAL | 6 refills | Status: DC

## 2016-09-30 MED ORDER — WARFARIN SODIUM 5 MG PO TABS: 5.0000 mg | ORAL_TABLET | Freq: Once | ORAL | Status: DC

## 2016-09-30 NOTE — Discharge Summary (Signed)
Advanced Heart Failure Discharge Note  Discharge Summary   Patient ID: Brianna Hanson MRN: [id number], DOB/AGE: [birth year] 80 y.o. Admit date: 09/21/2016 D/C date:     09/30/2016   Primary Discharge Diagnoses:  1. Lower GI bleeding - acute diverticular bleeding with symptomatic anemia 2. Nausea and vomiting 3. Chronic systolic HF s/p HM II LVAD 4. HTN 5. AKI  6. Hypokalemia  Consultation Gastroenterology Interventional Radiology  Hospital Course:   [name], She is s/p implantation of the HeartMate II LVAD on 04/06/13 for DT. She is not on aspirin due to dizziness.   Admitted 09/21/16 after developing N/V and noticing low flow alarms on VAD.  Pt then developed eighty episodes of melena with continued low flow alarms. Started to feel very weak so called VAD pager and reported to ED.   In ER FOBT + with Hgb 12.9. Creatinine 0.8 -> 1.4. Given 2 L NS. BP meds cut back. Thought to be volume depletion. Continued to have BRBPR. Discussed with GI and tagged scan recommended.  Tagged RBC with bleeding rectosigmoid. Received FFP and fluid bolus and LVAD speed cut back. Continued to have intermittent episodes of BRBPR.   Recurrent BRBPR 09/24/16 prompted IR intervention with Mesenteric angiography, though no source of bleeding found. Bleeding continued to slow as INR trended down.  New range of 1.8 - 2.3 for INR.       Hospital course complicated by frequent low flow alarms requiring IVF.  Overall positive 6 L this admission, although output not properly recorded (only 2 days of data). With no further bleeding and therapeutic INR back on coumadin, pt thought stable for home on 09/30/16. Pt VAD speed decreased from 8600 -> 8400 09/29/16 with ramp Echo.  Pt will be discharged in stable condition to home. She will check INR at home later this week and have close follow up in HF clinic as below next week.  Knows to call with any recurrent bleeding.   Discharge Weight Range: 170 lbs Discharge Vitals: Blood  pressure 115/79, pulse 70, temperature 97.7 F (36.5 C), temperature source Oral, resp. rate 18, height 5' (1.524 m), weight 170 lb (77.1 kg), SpO2 96 %.  Labs: Lab Results  Component Value Date   WBC 5.7 09/30/2016   HGB 9.7 (L) 09/30/2016   HCT 30.6 (L) 09/30/2016   MCV 91.9 09/30/2016   PLT 230 09/30/2016     Recent Labs Lab 09/26/16 0229  NA 140  K 3.9  CL 108  CO2 24  BUN 8  CREATININE 0.95  CALCIUM 9.1  GLUCOSE 88   Lab Results  Component Value Date   CHOL  05/05/2010    130        ATP III CLASSIFICATION:  <200     mg/dL   Desirable  200-239  mg/dL   Borderline High  >=240    mg/dL   High          HDL 36 (L) 05/05/2010   LDLCALC  05/05/2010    76        Total Cholesterol/HDL:CHD Risk Coronary Heart Disease Risk Table                     Men   Women  1/2 Average Risk   3.4   3.3  Average Risk       5.0   4.4  2 X Average Risk   9.6   7.1  3 X Average Risk  23.4   11.0  Use the calculated Patient Ratio above and the CHD Risk Table to determine the patient's CHD Risk.        ATP III CLASSIFICATION (LDL):  <100     mg/dL   Optimal  100-129  mg/dL   Near or Above                    Optimal  130-159  mg/dL   Borderline  160-189  mg/dL   High  >190     mg/dL   Very High   TRIG 90 05/05/2010   BNP (last 3 results)  Recent Labs  08/06/16 1103  BNP 474.2*    ProBNP (last 3 results) No results for input(s): PROBNP in the last 8760 hours.   Diagnostic Studies/Procedures   No results found.  Discharge Medications   Allergies as of 09/30/2016   No Known Allergies     Medication List    TAKE these medications   amiodarone 200 MG tablet Commonly known as:  PACERONE Take 0.5 tablets (100 mg total) by mouth daily.   carvedilol 6.25 MG tablet Commonly known as:  COREG Take 1 tablet (6.25 mg total) by mouth 2 (two) times daily with a meal.   docusate sodium 100 MG capsule Commonly known as:  COLACE Take 1 capsule (100 mg total) by  mouth 2 (two) times daily. What changed:  when to take this   ferrous sulfate 325 (65 FE) MG tablet Take 1 tablet (325 mg total) by mouth 2 (two) times daily with a meal.   gabapentin 100 MG capsule Commonly known as:  NEURONTIN TAKE ONE CAPSULE BY MOUTH AT BEDTIME OR AS DIRECTED   hydrALAZINE 50 MG tablet Commonly known as:  APRESOLINE Take 1.5 tablets (75 mg total) by mouth 3 (three) times daily.   losartan 50 MG tablet Commonly known as:  COZAAR Take 2 tablets (100 mg total) by mouth daily. What changed:  when to take this   pantoprazole 40 MG tablet Commonly known as:  PROTONIX TAKE 1 TABLET BY MOUTH EVERY DAY   potassium chloride SA 20 MEQ tablet Commonly known as:  K-DUR,KLOR-CON Take 20 mEq by mouth 2 (two) times daily.   traMADol 50 MG tablet Commonly known as:  ULTRAM Take 1 tablet (50 mg total) by mouth every 12 (twelve) hours as needed for moderate pain.   Vitamin D3 1000 units Caps Take 1 tablet by mouth daily.   warfarin 2.5 MG tablet Commonly known as:  COUMADIN Take 5 mg daily, except take 2.5 mg Tuesdays and Saturdays. What changed:  how much to take  how to take this  when to take this  additional instructions       Disposition   The patient will be discharged in stable condition to home. Discharge Instructions    (HEART FAILURE PATIENTS) Call MD:  Anytime you have any of the following symptoms: 1) 3 pound weight gain in 24 hours or 5 pounds in 1 week 2) shortness of breath, with or without a dry hacking cough 3) swelling in the hands, feet or stomach 4) if you have to sleep on extra pillows at night in order to breathe.    Complete by:  As directed    Call MD for:  persistant dizziness or light-headedness    Complete by:  As directed    Diet - low sodium heart healthy    Complete by:  As directed    Discharge instructions    Complete by:  As directed    PLEASE CHECK YOUR INR AT [facility] 10/03/16 and send to HF clinic.   Increase activity  slowly    Complete by:  As directed      Follow-up Information    Glori Bickers, MD Follow up on 10/08/2016.   Specialty:  Cardiology Why:  at 0900 for post hospital follow up. Please bring all of your medications with you to your visit. The code for parking is 5001. Contact information: [address] [place] [address] [phone]             Duration of Discharge Encounter: Greater than 35 minutes   Signed, Annamaria Helling 09/30/2016, 2:16 PM   Patient seen and examined with Oda Kilts, PA-C. We discussed all aspects of the encounter. I agree with the assessment and plan as stated above.   She is ready for d/c. Will need close f/u to make sure no recurrent bleeding and that VAD parameters are stable.   Debby Clyne,MD 7:44 PM

## 2016-09-30 NOTE — Progress Notes (Signed)
Pt education and discharge teaching complete with pt and her husband at bedside. Pt verbalized understanding of education.  No further questions, comments, or concerns. Emotional support provided.

## 2016-09-30 NOTE — Progress Notes (Signed)
HeartMate 2 Rounding Note  Subjective:    Tagged RBC bleeding rectosigmoid. Due to active bleeding she received FFP, fluid bolus, and LVAD speed was cut back.   S/P mesenteric angiography --> no source of bleeding.   Hgb low but stable. Feeling OK this am. Denies any further bleeding yesterday.   Received 1 L NS yesterday pm for low flows.  INR 1.82 this am. Coumadin per pharmacy.   LVAD INTERROGATION:  HeartMate II LVAD:  Flow 3.4 liters/min, speed 8400, power 4,  PI 6.9. > 50 Low flow events last 24 hrs. 3-4 PI events.  Only one PI event since 1700 last night.   Objective:    Vital Signs:   Temp:  [97.8 F (36.6 C)-98.4 F (36.9 C)] 98.1 F (36.7 C) (01/29 0400) Pulse Rate:  [62-79] 62 (01/28 2314) Resp:  [18] 18 (01/28 1920) BP: (90-98)/(67-78) 96/78 (01/28 2314) SpO2:  [96 %-100 %] 96 % (01/28 2314) Weight:  [170 lb (77.1 kg)] 170 lb (77.1 kg) (01/29 0400) Last BM Date: 09/24/16 Mean arterial Pressure 98 this am prior to morning meds.   Intake/Output:   Intake/Output Summary (Last 24 hours) at 09/30/16 0758 Last data filed at 09/30/16 0000  Gross per 24 hour  Intake             2080 ml  Output             2100 ml  Net              -20 ml     Physical Exam: General:  Lying in bed. NAD.  HEENT: Normal  Neck: supple. JVP 6-7. Carotids 2+ bilat; no bruits. No thyromegaly or nodule noted  Cor: Mechanical heart sounds with LVAD hum present. Lungs: Clear, normal effort Abdomen: soft, NT, ND, no HSM. No bruits or masses. +BS  Driveline: C/D/I; securement device intact and driveline incorporated Extremities: no cyanosis, clubbing, rash. Trace ankle edema.   Neuro: alert & orientedx3, cranial nerves grossly intact. moves all 4 extremities w/o difficulty. Affect pleasant  Telemetry: Reviewed, NSR.   Labs: Basic Metabolic Panel:  Recent Labs Lab 09/24/16 0308 09/25/16 0151 09/26/16 0229  NA 140 139 140  K 3.6 3.4* 3.9  CL 115* 110 108  CO2 20* 20* 24    GLUCOSE 78 75 88  BUN <5* <5* 8  CREATININE 0.83 0.98 0.95  CALCIUM 8.5* 8.6* 9.1    Liver Function Tests: No results for input(s): AST, ALT, ALKPHOS, BILITOT, PROT, ALBUMIN in the last 168 hours. No results for input(s): LIPASE, AMYLASE in the last 168 hours. No results for input(s): AMMONIA in the last 168 hours.  CBC:  Recent Labs Lab 09/26/16 0229 09/27/16 0251 09/28/16 0506 09/29/16 0309 09/30/16 0258  WBC 6.1 6.5 6.5 6.2 5.7  NEUTROABS 3.6 3.6 3.3 3.4 3.0  HGB 10.1* 10.3* 10.4* 9.6* 9.7*  HCT 31.0* 31.3* 31.9* 30.3* 30.6*  MCV 89.9 89.9 90.6 91.0 91.9  PLT 163 198 235 219 230    INR:  Recent Labs Lab 09/26/16 0229 09/27/16 0251 09/28/16 0506 09/29/16 0309 09/30/16 0258  INR 2.02 1.51 1.49 1.65 1.82    Other results:    Imaging: No results found.   Medications:     Scheduled Medications: . amiodarone  100 mg Oral Daily  . carvedilol  6.25 mg Oral BID WC  . docusate sodium  100 mg Oral BID  . gabapentin  100 mg Oral QHS  . hydrALAZINE  75 mg Oral  TID  . losartan  100 mg Oral Daily  . pantoprazole  40 mg Oral Daily  . potassium chloride SA  20 mEq Oral BID  . Warfarin - Pharmacist Dosing Inpatient   Does not apply q1800    Infusions:   PRN Medications:     Assessment/plan:   1. Nausea/vomiting - likely viral gastroenteritis.  - Resolved without recurrence.  2. Lower GI bleeding - Tagged scan with active bleed in rectosigmoid. Bleeding slowed with FFP.  - S/P mesenteric arteriogram negative for active bleed.  - No BMs. Back on coumadin. INR 1.82 this am with goal of 1.8 - 2.3  Hgb  9.7 - Continue PPI.  - Continue colace.  - GI care appreciated.  3. Chronic systolic HF s/p HM-II VAD - VAD interrogated personally. Significant amount of low flow yesterday but better since speed drop and IVF.   - Speed turned down to 8400 after Ramp Echo 09/29/16.  - Ramp Echo 09/29/16 with RV mild HK, small LV with apical cannula pointed toward  septum.  - Not on chronic diuretics.  4. HTN  - Stable on current meds.  - Continue Hydral 75 mg TID.  - Continue losartan 100 mg daily.  - Continue carvedilol 6.15 bid 5. AKI - Stable.  6. Hypokalemia - Resolved.   Improved and seems stable. Possibly home today.   Length of Stay: [address]  Annamaria Helling  09/30/2016, 7:58 AM  VAD Team --- VAD ISSUES ONLY--- Pager [phone] (7am - 7am)  Advanced Heart Failure Team  Pager [phone] (M-F; 7a - 4p)  Please contact [facility] for night-coverage after hours (4p -7a ) and weekends on [email or web address]  Patient seen and examined with Oda Kilts, PA-C. We discussed all aspects of the encounter. I agree with the assessment and plan as stated above.   VAD parameters stable after IVF. Hgb stable. Not much more we can do about low flows at this point. Will let her go home today with salt and fluid liberalization. Follow closely in HF Clinic.   [name], Daniel,MD 8:46 AM

## 2016-09-30 NOTE — Discharge Instructions (Signed)
Information on my medicine - Coumadin   (Warfarin)  This medication education was reviewed with me or my healthcare representative as part of my discharge preparation.  The pharmacist that spoke with me during my hospital stay was:  Georgina Peer, [facility]  Why was Coumadin prescribed for you? Coumadin was prescribed for you because you have a blood clot or a medical condition that can cause an increased risk of forming blood clots. Blood clots can cause serious health problems by blocking the flow of blood to the heart, lung, or brain. Coumadin can prevent harmful blood clots from forming. As a reminder your indication for Coumadin is:   Blood Clot Prevention After Heart Pump Surgery  What test will check on my response to Coumadin? While on Coumadin (warfarin) you will need to have an INR test regularly to ensure that your dose is keeping you in the desired range. The INR (international normalized ratio) number is calculated from the result of the laboratory test called prothrombin time (PT).  If an INR APPOINTMENT HAS NOT ALREADY BEEN MADE FOR YOU please schedule an appointment to have this lab work done by your health care provider within 7 days. Your INR goal has been reduced to 1.8 - 2.3.   What  do you need to  know  About  COUMADIN? Take Coumadin (warfarin) exactly as prescribed by your healthcare provider about the same time each day.  DO NOT stop taking without talking to the doctor who prescribed the medication.  Stopping without other blood clot prevention medication to take the place of Coumadin may increase your risk of developing a new clot or stroke.  Get refills before you run out.  What do you do if you miss a dose? If you miss a dose, take it as soon as you remember on the same day then continue your regularly scheduled regimen the next day.  Do not take two doses of Coumadin at the same time.  Important Safety Information A possible side effect of Coumadin (Warfarin) is an  increased risk of bleeding. You should call your healthcare provider right away if you experience any of the following: ? Bleeding from an injury or your nose that does not stop. ? Unusual colored urine (red or dark brown) or unusual colored stools (red or black). ? Unusual bruising for unknown reasons. ? A serious fall or if you hit your head (even if there is no bleeding).  Some foods or medicines interact with Coumadin (warfarin) and might alter your response to warfarin. To help avoid this: ? Eat a balanced diet, maintaining a consistent amount of Vitamin K. ? Notify your provider about major diet changes you plan to make. ? Avoid alcohol or limit your intake to 1 drink for women and 2 drinks for men per day. (1 drink is 5 oz. wine, 12 oz. beer, or 1.5 oz. liquor.)  Make sure that ANY health care provider who prescribes medication for you knows that you are taking Coumadin (warfarin).  Also make sure the healthcare provider who is monitoring your Coumadin knows when you have started a new medication including herbals and non-prescription products.  Coumadin (Warfarin)  Major Drug Interactions  Increased Warfarin Effect Decreased Warfarin Effect  Alcohol (large quantities) Antibiotics (esp. Septra/Bactrim, Flagyl, Cipro) Amiodarone (Cordarone) Aspirin (ASA) Cimetidine (Tagamet) Megestrol (Megace) NSAIDs (ibuprofen, naproxen, etc.) Piroxicam (Feldene) Propafenone (Rythmol SR) Propranolol (Inderal) Isoniazid (INH) Posaconazole (Noxafil) Barbiturates (Phenobarbital) Carbamazepine (Tegretol) Chlordiazepoxide (Librium) Cholestyramine (Questran) Griseofulvin Oral Contraceptives Rifampin Sucralfate (Carafate)  Vitamin K   Coumadin (Warfarin) Major Herbal Interactions  Increased Warfarin Effect Decreased Warfarin Effect  Garlic Ginseng Ginkgo biloba Coenzyme Q10 Green tea St. Johns wort    Coumadin (Warfarin) FOOD Interactions  Eat a consistent number of servings per week  of foods HIGH in Vitamin K (1 serving =  cup)  Collards (cooked, or boiled & drained) Kale (cooked, or boiled & drained) Mustard greens (cooked, or boiled & drained) Parsley *serving size only =  cup Spinach (cooked, or boiled & drained) Swiss chard (cooked, or boiled & drained) Turnip greens (cooked, or boiled & drained)  Eat a consistent number of servings per week of foods MEDIUM-HIGH in Vitamin K (1 serving = 1 cup)  Asparagus (cooked, or boiled & drained) Broccoli (cooked, boiled & drained, or raw & chopped) Brussel sprouts (cooked, or boiled & drained) *serving size only =  cup Lettuce, raw (green leaf, endive, romaine) Spinach, raw Turnip greens, raw & chopped   These websites have more information on Coumadin (warfarin):  FailFactory.se; VeganReport.com.au;

## 2016-09-30 NOTE — Progress Notes (Signed)
ANTICOAGULATION CONSULT NOTE - Follow Up Consult  Pharmacy Consult for Coumadin Indication: LVAD  No Known Allergies  Patient Measurements: Height: 5' (152.4 cm) Weight: 170 lb (77.1 kg) IBW/kg (Calculated) : 45.5  Vital Signs: Temp: 98.1 F (36.7 C) (01/29 0400) Temp Source: Oral (01/29 0400) BP: 96/78 (01/28 2314) Pulse Rate: 62 (01/28 2314)  Labs:  Recent Labs  09/28/16 0506 09/29/16 0309 09/30/16 0258  HGB 10.4* 9.6* 9.7*  HCT 31.9* 30.3* 30.6*  PLT 235 219 230  LABPROT 18.1* 19.7* 21.3*  INR 1.49 1.65 1.82    Estimated Creatinine Clearance: 44 mL/min (by C-G formula based on SCr of 0.95 mg/dL).  Assessment: 81yof on coumadin pta for LVAD, admitted with rectal bleeding. INR 3 on admit, she got one dose of coumadin 1/20 and then it was held. Received FFP 1/21 and 1/22. Underwent tagged RBC scan which showed bleeding, however mesenteric angiogram was negative.  Coumadin resumed slowly 1/25 with new INR goal 1.8-2.3. INR 1.8 today. Patient has been running on the higher side in clinic on current pta dose. Will plan on sending patient home on 5mg  daily but have her take 2.5mg  two days a week initially.    Goal of Therapy:  INR 1.8-2.3 Monitor platelets by anticoagulation protocol: Yes   Plan:   Plan on giving 5mg  tonight then recommend home on 5mg  daily except 2.5mg  on Tuesday and Saturdays for now  Erin Hearing PharmD., BCPS Clinical Pharmacist Pager [phone] 09/30/2016 8:15 AM

## 2016-10-01 ENCOUNTER — Encounter (HOSPITAL_COMMUNITY): Payer: Self-pay | Admitting: Infectious Diseases

## 2016-10-02 ENCOUNTER — Ambulatory Visit (INDEPENDENT_AMBULATORY_CARE_PROVIDER_SITE_OTHER): Payer: Medicare Other | Admitting: *Deleted

## 2016-10-02 DIAGNOSIS — I429 Cardiomyopathy, unspecified: Secondary | ICD-10-CM | POA: Diagnosis not present

## 2016-10-02 NOTE — Progress Notes (Signed)
Remote ICD transmission.   

## 2016-10-03 ENCOUNTER — Ambulatory Visit (HOSPITAL_COMMUNITY): Payer: Self-pay | Admitting: Pharmacist

## 2016-10-03 ENCOUNTER — Encounter: Payer: Self-pay | Admitting: Cardiology

## 2016-10-03 DIAGNOSIS — Z7901 Long term (current) use of anticoagulants: Secondary | ICD-10-CM | POA: Diagnosis not present

## 2016-10-03 LAB — POCT INR: INR: 2.1

## 2016-10-03 NOTE — Progress Notes (Signed)
D/W [name] and will keep patient's INR goal 2 - 2.5 with current speed despite GI bleed (diverticular not AVM associated).   Janene Madeira, RN VAD Coordinator   Office: [phone] 24/7 Emergency VAD Pager: [phone]

## 2016-10-06 ENCOUNTER — Other Ambulatory Visit (HOSPITAL_COMMUNITY): Payer: Self-pay | Admitting: Internal Medicine

## 2016-10-06 DIAGNOSIS — I5022 Chronic systolic (congestive) heart failure: Secondary | ICD-10-CM

## 2016-10-06 DIAGNOSIS — I50810 Right heart failure, unspecified: Secondary | ICD-10-CM

## 2016-10-06 DIAGNOSIS — Z95811 Presence of heart assist device: Secondary | ICD-10-CM

## 2016-10-08 ENCOUNTER — Ambulatory Visit (HOSPITAL_COMMUNITY)
Admission: RE | Admit: 2016-10-08 | Discharge: 2016-10-08 | Disposition: A | Discharge status: Home / Self Care | Payer: Medicare Other | Source: Ambulatory Visit | Attending: Internal Medicine | Admitting: Internal Medicine

## 2016-10-08 ENCOUNTER — Ambulatory Visit (HOSPITAL_COMMUNITY): Payer: Self-pay | Admitting: Pharmacist

## 2016-10-08 ENCOUNTER — Other Ambulatory Visit (HOSPITAL_COMMUNITY): Payer: Self-pay | Admitting: *Deleted

## 2016-10-08 DIAGNOSIS — Z5181 Encounter for therapeutic drug level monitoring: Secondary | ICD-10-CM

## 2016-10-08 DIAGNOSIS — K922 Gastrointestinal hemorrhage, unspecified: Secondary | ICD-10-CM | POA: Diagnosis not present

## 2016-10-08 DIAGNOSIS — Z86010 Personal history of colonic polyps: Secondary | ICD-10-CM | POA: Diagnosis not present

## 2016-10-08 DIAGNOSIS — M1712 Unilateral primary osteoarthritis, left knee: Secondary | ICD-10-CM

## 2016-10-08 DIAGNOSIS — I110 Hypertensive heart disease with heart failure: Secondary | ICD-10-CM | POA: Insufficient documentation

## 2016-10-08 DIAGNOSIS — Z95810 Presence of automatic (implantable) cardiac defibrillator: Secondary | ICD-10-CM | POA: Diagnosis not present

## 2016-10-08 DIAGNOSIS — Z7901 Long term (current) use of anticoagulants: Secondary | ICD-10-CM | POA: Diagnosis not present

## 2016-10-08 DIAGNOSIS — I429 Cardiomyopathy, unspecified: Secondary | ICD-10-CM | POA: Insufficient documentation

## 2016-10-08 DIAGNOSIS — I5022 Chronic systolic (congestive) heart failure: Secondary | ICD-10-CM | POA: Diagnosis not present

## 2016-10-08 DIAGNOSIS — M25562 Pain in left knee: Secondary | ICD-10-CM

## 2016-10-08 DIAGNOSIS — M8580 Other specified disorders of bone density and structure, unspecified site: Secondary | ICD-10-CM | POA: Diagnosis not present

## 2016-10-08 DIAGNOSIS — Z8674 Personal history of sudden cardiac arrest: Secondary | ICD-10-CM | POA: Insufficient documentation

## 2016-10-08 DIAGNOSIS — Z79899 Other long term (current) drug therapy: Secondary | ICD-10-CM | POA: Insufficient documentation

## 2016-10-08 DIAGNOSIS — K644 Residual hemorrhoidal skin tags: Secondary | ICD-10-CM | POA: Diagnosis not present

## 2016-10-08 DIAGNOSIS — K8000 Calculus of gallbladder with acute cholecystitis without obstruction: Secondary | ICD-10-CM | POA: Insufficient documentation

## 2016-10-08 DIAGNOSIS — Z95811 Presence of heart assist device: Secondary | ICD-10-CM

## 2016-10-08 DIAGNOSIS — G8929 Other chronic pain: Secondary | ICD-10-CM | POA: Diagnosis not present

## 2016-10-08 DIAGNOSIS — R42 Dizziness and giddiness: Secondary | ICD-10-CM | POA: Diagnosis not present

## 2016-10-08 DIAGNOSIS — I480 Paroxysmal atrial fibrillation: Secondary | ICD-10-CM | POA: Diagnosis not present

## 2016-10-08 LAB — CBC
HCT: 31.3 % — ABNORMAL LOW (ref 36.0–46.0)
Hemoglobin: 9.8 g/dL — ABNORMAL LOW (ref 12.0–15.0)
MCH: 29 pg (ref 26.0–34.0)
MCHC: 31.3 g/dL (ref 30.0–36.0)
MCV: 92.6 fL (ref 78.0–100.0)
Platelets: 260 10*3/uL (ref 150–400)
RBC: 3.38 MIL/uL — ABNORMAL LOW (ref 3.87–5.11)
RDW: 16.6 % — ABNORMAL HIGH (ref 11.5–15.5)
WBC: 5.6 10*3/uL (ref 4.0–10.5)

## 2016-10-08 LAB — PROTIME-INR
INR: 2.41
Prothrombin Time: 26.7 seconds — ABNORMAL HIGH (ref 11.4–15.2)

## 2016-10-08 LAB — BASIC METABOLIC PANEL
Anion gap: 8 (ref 5–15)
BUN: 12 mg/dL (ref 6–20)
CO2: 21 mmol/L — ABNORMAL LOW (ref 22–32)
Calcium: 9.3 mg/dL (ref 8.9–10.3)
Chloride: 110 mmol/L (ref 101–111)
Creatinine, Ser: 1.06 mg/dL — ABNORMAL HIGH (ref 0.44–1.00)
GFR calc Af Amer: 56 mL/min — ABNORMAL LOW (ref >60)
GFR calc non Af Amer: 49 mL/min — ABNORMAL LOW (ref >60)
Glucose, Bld: 93 mg/dL (ref 65–99)
Potassium: 4.2 mmol/L (ref 3.5–5.1)
Sodium: 139 mmol/L (ref 135–145)

## 2016-10-08 LAB — LACTATE DEHYDROGENASE: LDH: 214 U/L — ABNORMAL HIGH (ref 98–192)

## 2016-10-08 MED ORDER — TRAMADOL HCL 50 MG PO TABS: 100.0000 mg | ORAL_TABLET | Freq: Two times a day (BID) | ORAL | 3 refills | Status: DC | PRN

## 2016-10-08 MED ORDER — ACETAMINOPHEN 500 MG PO TABS: 1000.0000 mg | ORAL_TABLET | Freq: Three times a day (TID) | ORAL | 0 refills | Status: DC | PRN

## 2016-10-08 NOTE — Progress Notes (Signed)
Patient presents for hospital discharge  follow up in [facility] today. Reports no problems with VAD equipment or concerns with drive line.  Vital Signs:  Doppler Pressure 100   Automatc BP: 117/88 (98) HR:76   SPO2:97  %  Weight: 178.2 lb w/o eqt Last weight: 180.4 lb Home weights: 180 lbs There is no height or weight on file to calculate BMI.   VAD Indication: Destination therapy    VAD interrogation & Equipment Management: Speed:8400 Flow: 3.6 Power:3.9 w    PI:7.2  Alarms: no clinical alarms Events: 2-5 low flow alarms since discharge. 2-3 PI events daily.  Fixed speed 8400 Low speed limit: 8000  Primary Controller:  Replace back up battery in 63months. Back up controller:   Replace back up battery in 20 months.  Annual Equipment Maintenance on UBC/PM was performed on 04/2017.   I reviewed the LVAD parameters from today and compared the results to the patient's prior recorded data. LVAD interrogation was NEGATIVE for significant power changes, POSITIVE for clinical alarms (no audible alarms)and STABLE for PI events/speed drops. No programming changes were made and pump is functioning within specified parameters. Pt is performing daily controller and system monitor self tests along with completing weekly and monthly maintenance for LVAD equipment.  LVAD equipment check completed and is in good working order. Back-up equipment present. Charged back up battery and performed self-test on equipment.   Exit Site Care: Drive line is being maintained weekly  by husband. Drive line exit site well healed and incorporated. The velour is fully implanted at exit site. Dressing dry and intact. No erythema or drainage. Stabilization device present and accurately applied. Pt denies fever or chills. Pt states they have adequate dressing supplies at home.   Significant Events on VAD Support:  09/21/16 GIB, Ramp echo speed decreased to 8400   Device:St jude ICD Therapies:  on Last check:    BP & Labs:  MAP100 - Doppler is reflecting map  Hgb 9.8 - No S/S of bleeding. Specifically denies melena/BRBPR or nosebleeds.  LDH stable at 214 with established baseline of 170- 230. Denies tea-colored urine. No power elevations noted on interrogation.   Patient deferred  6 minute walk test due to knee pain. [name]   Neurocognitive trail making completed correctly in 2.5 minutes.   [address] Cardiomyopathy, EQ-5D-3L and post-VAD QOL completed by the patient with assistance from the Coordinator/caregiver.  Referral being made to Sports medicine for osteoarthritis of the left knee. Instructed to use extra strength Tylenol in addition to Tramadol.   Return to clinic in 2 months.  Balinda Quails RN Bell Coordinator   Office: [phone] 24/7 Emergency VAD Pager: [phone]

## 2016-10-08 NOTE — Patient Instructions (Signed)
Increase Tramadol to 100 mg (2 tablets) every 8 hours as needed for knee pain. Add extra strength Tylenol (500 mg tablets) two tablets twice daily.   Return to clinic in two months.

## 2016-10-08 NOTE — Progress Notes (Signed)
ADVANCED HF CLINIC NOTE  Patient ID: Brianna Hanson, female   DOB: 1935-08-12, 82 y.o.   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 Underwent implantation of the HeartMate II LVAD on 04/06/13 for DT. She is not on aspirin due to dizziness.   Admitted to [facility] from 12/4 to 08/18/15 for acute cholecystitis. Underwent placement of a cholecystotomy drain by IR.  Seen in IR drain clinic and cholangiogram showed patent ducts with residual gallstones.  She was admitted in 2/17 for laparoscopic cholecystectomy.  This was successful.   Admitted 1/18 for LGIB felt to be diverticular. Bleeding scan + so underwent visceral angio in IR and no source of bleeding found. Bleeding eventually resolved. Hospitalization c/b low flows on VAD and given IVF. Speed turned down to 8400   She returns for post-hospital follow up. Doing very well. Doing all ADLs and going out without any SOB or any limitations. Bleeding has resolved. No low flow alarms. Says flows are better when she is walking around.  Denies CP, edema, orthopnea, PND. No focal neuro symptoms. Main complaint is knee pain.   Denies driveline trauma, erythema or drainage.  Denies ICD shocks.   Reports taking Coumadin as prescribed.  Denies bright red blood per rectum or melena, no dark urine or hematuria.    Labs (3/17): hgb 11.4, INR 2.36    VAD Indication: Destination therapy    VAD interrogation & Equipment Management: Speed:8400 Flow: 3.6 Power:3.9 w PI:7.2  Alarms: no clinical alarms Events: 2-5 low flow alarms since discharge. 2-3 PI events daily.  Fixed speed 8400 Low speed limit: 8000  Primary Controller: Replace back up battery in 69months. Back up controller: Replace back up battery in  44months.  Past Medical History:  Diagnosis Date  . AICD (automatic cardioverter/defibrillator) present   . Anemia   . Atrial fibrillation or flutter   . Cardiac arrest - ventricular fibrillation 12/10   with successful resucitation, S/p ICD  . CHF (congestive heart failure) ([place])   . Diverticula of colon 2011  . HTN (hypertension)    moderate  . ICD (implantable cardiac defibrillator) in place    she has received appropriate therapy for VF  . Internal and external hemorrhoids without complication [id number]  . Nonischemic cardiomyopathy ([place])    followed by Brianna Hanson at [facility]  . Osteopenia   . Plasma cell disorder 03/20/2012  . Plasma cell disorder 03/20/2012  . Sessile colonic polyp 2011   Brianna Hanson    Current Outpatient Prescriptions  Medication Sig Dispense Refill  . amiodarone (PACERONE) 200 MG tablet Take 0.5 tablets (100 mg total) by mouth daily. 90 tablet 3  . carvedilol (COREG) 6.25 MG tablet Take 1 tablet (6.25 mg total) by mouth 2 (two) times daily with a meal. 60 tablet 5  . Cholecalciferol (VITAMIN D3) 1000 UNITS CAPS Take 1 tablet by mouth daily.      [name] docusate sodium (COLACE) 100 MG capsule Take 1 capsule (100 mg total) by mouth 2 (two) times daily. (Patient taking differently: Take 100 mg by mouth daily. ) 60 capsule 6  . ferrous sulfate 325 (65 FE) MG tablet Take 1 tablet (325 mg total) by mouth 2 (two) times daily with a meal.  60 tablet 6  . gabapentin (NEURONTIN) 100 MG capsule TAKE ONE CAPSULE BY MOUTH AT BEDTIME OR AS DIRECTED 30 capsule 5  . hydrALAZINE (APRESOLINE) 50 MG tablet Take 1.5 tablets (75 mg total) by mouth 3 (three) times daily. 270 tablet 3  . losartan (COZAAR) 50 MG tablet TAKE 2 TABLETS BY MOUTH EVERY DAY 30 tablet 6  . pantoprazole (PROTONIX) 40 MG tablet TAKE 1 TABLET BY MOUTH EVERY DAY 30 tablet 6  . potassium chloride SA (K-DUR,KLOR-CON) 20 MEQ tablet Take 20 mEq by mouth 2 (two) times daily.    . traMADol (ULTRAM) 50 MG tablet Take 2 tablets  (100 mg total) by mouth every 12 (twelve) hours as needed for moderate pain. 60 tablet 3  . warfarin (COUMADIN) 2.5 MG tablet Take 5 mg daily, except take 2.5 mg Tuesdays and Saturdays. 60 tablet 6  . acetaminophen (TYLENOL) 500 MG tablet Take 2 tablets (1,000 mg total) by mouth every 8 (eight) hours as needed for mild pain. 30 tablet 0   No current facility-administered medications for this encounter.     Patient has no known allergies.  REVIEW OF SYSTEMS: All systems negative except as listed in HPI, PMH and Problem list.   Vital Signs:  Doppler Pressure 100              Automatc BP: 117/88 (98) HR:76  SPO2:97  %  Weight: 178.2 lb w/o eqt Last weight: 180.4 lb Home weights: 180 lbs There is no height or weight on file to calculate BMI.  Physical Exam: GENERAL: Well appearing, female, NAD, Brianna Hanson present. Ambulated in the clinic without difficulty.  HEENT: normal  NECK: Supple, JVP flat,  2+ bilaterally, no bruits.  No lymphadenopathy or thyromegaly appreciated.   CARDIAC:  LVAD hum present.  LUNGS:  Clear to auscultation bilaterally.  ABDOMEN:  Soft, round, nontender, positive bowel sounds. LVAD exit site: Dressing dry and intact.  No erythema or drainage.  Stabilization device present and accurately applied. Driveline dressing changed weekly. EXTREMITIES:  Warm and dry, no cyanosis, clubbing, rash, trace edema R>L NEUROLOGIC:  Alert and oriented x 4.  Gait steady.  No aphasia.  No dysarthria.  Affect pleasant.     ASSESSMENT AND PLAN:  1) Chronic systolic HF: NICM, s/p ICD and LVAD for DT(04/2013).  - Doing well s/p recent hospitalization for LGIB.  NYHA I-II. Volume status stable.  Does not take diuretics. Off spiro due to volume depletion  - Low flow alarms on VAD have resolved on 8400. Echo show cannula angulated toward septum. Need to keep volume up.  - Reinforced the need and importance of daily weights, a low sodium diet, and fluid restriction (less than 2 L a day).  Instructed to call the HF clinic if weight increases more than 3 lbs overnight or 5 lbs in a week.  2) Acute cholecystitis: Now s/p lap cholecystectomy.  Resolved.  3) LVAD placed for DT 04/2013: All parameters stable (reviewed personally).  - Check LDH, INR, CBC and BMET today.  - Remains on coumadin.   Not on aspirin due to intolerance. 4) PAF: Stable. Continue amiodarone to 100 mg daily. Follow TSH and LFTs.  Needs year eye exams.    5) HTN: BP elevated today in setting of not taking meds this am. Continue current regimen. Would not switch losartan to Entresto as diuretic effect may worsen low flows.   6) Recent LGIB: - likely diverticular. Has resolved Check labs today.  7) Chronic Anticoagulation: Goal INR  2-3 Continue coumadin. Check INR  Glori Bickers MD   10/08/2016

## 2016-10-12 ENCOUNTER — Other Ambulatory Visit: Payer: Self-pay | Admitting: Internal Medicine

## 2016-10-14 LAB — CUP PACEART REMOTE DEVICE CHECK
Battery Remaining Longevity: 33 mo
Battery Remaining Percentage: 32 %
Battery Voltage: 2.89 V
Brady Statistic AP VP Percent: 1 %
Brady Statistic AP VS Percent: 6.9 %
Brady Statistic AS VP Percent: 1 %
Brady Statistic AS VS Percent: 90 %
Brady Statistic RA Percent Paced: 3.6 %
Brady Statistic RV Percent Paced: 1 %
Date Time Interrogation Session: 20180131102921
HighPow Impedance: 45 Ohm
Implantable Lead Implant Date: 20110104
Implantable Lead Implant Date: 20110104
Implantable Lead Location: 753859
Implantable Lead Location: 753860
Implantable Lead Model: 7121
Implantable Pulse Generator Implant Date: 20110104
Lead Channel Impedance Value: 300 Ohm
Lead Channel Impedance Value: 310 Ohm
Lead Channel Pacing Threshold Amplitude: 0.75 V
Lead Channel Pacing Threshold Amplitude: 0.75 V
Lead Channel Pacing Threshold Pulse Width: 0.5 ms
Lead Channel Pacing Threshold Pulse Width: 0.5 ms
Lead Channel Sensing Intrinsic Amplitude: 1 mV
Lead Channel Sensing Intrinsic Amplitude: 11.4 mV
Lead Channel Setting Pacing Amplitude: 2 V
Lead Channel Setting Pacing Amplitude: 2.5 V
Lead Channel Setting Pacing Pulse Width: 0.5 ms
Lead Channel Setting Sensing Sensitivity: 0.5 mV
Pulse Gen Serial Number: 754815

## 2016-10-15 ENCOUNTER — Ambulatory Visit (HOSPITAL_COMMUNITY): Payer: Self-pay | Admitting: Pharmacist

## 2016-10-15 LAB — POCT INR: INR: 2.5

## 2016-10-22 LAB — POCT INR: INR: 2

## 2016-10-23 ENCOUNTER — Ambulatory Visit (HOSPITAL_COMMUNITY): Payer: Self-pay | Admitting: Pharmacist

## 2016-10-24 ENCOUNTER — Other Ambulatory Visit (HOSPITAL_COMMUNITY): Payer: Self-pay | Admitting: Internal Medicine

## 2016-10-29 ENCOUNTER — Ambulatory Visit
Admission: RE | Admit: 2016-10-29 | Discharge: 2016-10-29 | Disposition: A | Discharge status: Home / Self Care | Payer: Medicare Other | Source: Ambulatory Visit | Attending: Sports Medicine | Admitting: Sports Medicine

## 2016-10-29 ENCOUNTER — Ambulatory Visit (INDEPENDENT_AMBULATORY_CARE_PROVIDER_SITE_OTHER): Payer: Medicare Other | Admitting: Sports Medicine

## 2016-10-29 ENCOUNTER — Encounter: Payer: Self-pay | Admitting: Sports Medicine

## 2016-10-29 ENCOUNTER — Other Ambulatory Visit: Payer: Self-pay | Admitting: Sports Medicine

## 2016-10-29 VITALS — Ht 60.0 in | Wt 169.0 lb

## 2016-10-29 DIAGNOSIS — M1712 Unilateral primary osteoarthritis, left knee: Secondary | ICD-10-CM

## 2016-10-29 MED ORDER — METHYLPREDNISOLONE ACETATE 40 MG/ML IJ SUSP: 40.0000 mg | Freq: Once | INTRAMUSCULAR | Status: DC

## 2016-10-29 NOTE — Progress Notes (Signed)
  Brianna Hanson - 82 y.o. female MRN [id number]  Date of birth: 11-17-1934  SUBJECTIVE:   CC: Left Knee Pain  HPI: Presents with complaints of bilateral knee pain left greater than right for the last 3 months. States that she started having nagging aching knee pain. Denies any previous pain in her knees. Denies any injury or trauma. Was seen by her primary care provider who gave her cortisone injection on 09/05/2016. States that the injection worked for about 2 weeks. Takes Tramadol as well for pain which helps relieve pain. Wants to know what can be done to help her knees.  PMH significant for chronic pain to left knee with known OA  ROS per HPI. Denies fevers, tingling or numbness   HISTORY: Past Medical, Surgical, Social, and Family History Reviewed & Updated per EMR.   Pertinent Historical Findings include: A. Fib on anticoagulation, LVAD, heart failure  PHYSICAL EXAM:  Ht 5' (1.524 m)   Wt 169 lb (76.7 kg)   BMI 33.01 kg/m   General: obese AAF female, well-appearing, NAD, pleasant  Left Knee: Normal to inspection with no erythema or effusion or obvious bony abnormalities. Palpation with no warmth, patellar tenderness, or condyle tenderness. Does have medial joint line tenderness ROM full  Ligaments with solid consistent endpoints including ACL, PCL, LCL, MCL. Non painful patellar compression. Patellar glide with crepitus. Strength normal  Antalgic gait   Pes planus with significant pronation   ASSESSMENT & PLAN:   A: Bilateral Knee pain with L>R. Consistent with DJD.   P: -will obtain imaging of bilateral knees -received left knee injection today -placed insoles with scaphoid pads -patient is not a candidate for surgery or NSAIDs due to medical conditions -pain management will be the primary goal for this patient; she can continue her tramadol  -follow-up in 3 weeks   Luiz Blare, DO 10/29/2016, 11:18 AM PGY-3, [place]  Patient seen and  evaluated with the resident. I agree with the above plan of care. Patient's x-rays of her left knee show moderate medial compartmental narrowing and mild patellofemoral DJD. We've decided to try a second cortisone injection. Hopefully this will provide her with better pain relief than the first injection. We will also try to give her arch support with some scaphoid pads in hopes that this too will unload pressure on her knees. Fortunately her tramadol does seem to help and I've reassured her that this is a medicine that she can take chronically. Follow-up with me in 3 weeks for reevaluation. If today's cortisone injection helps then we could consider injecting her right knee. If she continues to have pain then we could consider referral for Visco supplementation. This patient certainly is not a surgical candidate so we will need to continue with conservative management.  Consent obtained and verified. Time-out conducted. Noted no overlying erythema, induration, or other signs of local infection. Skin prepped in a sterile fashion. Topical analgesic spray: Ethyl chloride. Joint: left knee Needle: 22g 1.5 inch Completed without difficulty. Meds: 3cc 1% xylocaine, 1cc (40mg ) depomedrol  Advised to call if fevers/chills, erythema, induration, drainage, or persistent bleeding.

## 2016-10-31 ENCOUNTER — Ambulatory Visit (HOSPITAL_COMMUNITY): Payer: Self-pay | Admitting: Pharmacist

## 2016-10-31 DIAGNOSIS — Z7901 Long term (current) use of anticoagulants: Secondary | ICD-10-CM | POA: Diagnosis not present

## 2016-10-31 LAB — POCT INR: INR: 2.9

## 2016-11-03 ENCOUNTER — Other Ambulatory Visit: Payer: Self-pay | Admitting: Internal Medicine

## 2016-11-05 ENCOUNTER — Ambulatory Visit (HOSPITAL_COMMUNITY): Payer: Self-pay | Admitting: Pharmacist

## 2016-11-05 LAB — POCT INR: INR: 2.5

## 2016-11-12 LAB — POCT INR: INR: 1.6

## 2016-11-13 ENCOUNTER — Ambulatory Visit (HOSPITAL_COMMUNITY): Payer: Self-pay | Admitting: Pharmacist

## 2016-11-15 ENCOUNTER — Ambulatory Visit (HOSPITAL_COMMUNITY): Payer: Self-pay | Admitting: Pharmacist

## 2016-11-15 LAB — POCT INR: INR: 3

## 2016-11-19 ENCOUNTER — Ambulatory Visit (INDEPENDENT_AMBULATORY_CARE_PROVIDER_SITE_OTHER): Payer: Medicare Other | Admitting: Sports Medicine

## 2016-11-19 ENCOUNTER — Ambulatory Visit (HOSPITAL_COMMUNITY): Payer: Self-pay | Admitting: Pharmacist

## 2016-11-19 ENCOUNTER — Encounter: Payer: Self-pay | Admitting: Sports Medicine

## 2016-11-19 VITALS — BP 121/88 | Ht 60.0 in | Wt 169.0 lb

## 2016-11-19 DIAGNOSIS — M1712 Unilateral primary osteoarthritis, left knee: Secondary | ICD-10-CM

## 2016-11-19 LAB — POCT INR: INR: 2.6

## 2016-11-19 NOTE — Progress Notes (Signed)
   Subjective:    Patient ID: Brianna Hanson, female    DOB: 1934/09/07, 82 y.o.   MRN: [id number]  HPI   Patient comes in today for follow-up on left knee pain secondary to DJD. She is feeling much better after her recent cortisone injection. In fact, her right knee is feeling better as well. She is using her tramadol only as needed. She really likes her green sports insoles with scaphoid pads. She is here today with her husband.    Review of Systems    as above Objective:   Physical Exam  Obese. No acute distress  Examination of each knee shows range of motion from 0-110. 1+ boggy synovitis. Slight tenderness to palpation along the medial joint line on the left but negative McMurray's. No tenderness along the lateral joint line. Good ligamentous stability. Neurovascularly intact distally.  X-ray of the left knee shows moderate medial compartmental narrowing and mild patellofemoral DJD      Assessment & Plan:   Improved bilateral knee pain, left greater than right, secondary to DJD  Since the patient's right knee is also feeling better, we will hold on a cortisone injection today. Patient can continue to use her tramadol as needed. Continue with her green sports insoles and scaphoid pads as well. I did give her a single isometric quad exercise to start doing daily to help build strength in her knees. If she finds prolonged benefit from today's injection I would be happy to do a repeat injection at a later date. Alternatively, if her symptoms return rather quickly then she would be a good candidate for Visco supplementation. As noted previously, she is a poor surgical candidate so we want to continue with conservative management as long as possible. Follow-up as needed.

## 2016-11-26 ENCOUNTER — Ambulatory Visit (HOSPITAL_COMMUNITY): Payer: Self-pay | Admitting: Pharmacist

## 2016-11-26 DIAGNOSIS — Z7901 Long term (current) use of anticoagulants: Secondary | ICD-10-CM | POA: Diagnosis not present

## 2016-11-26 LAB — POCT INR: INR: 2.8

## 2016-11-28 ENCOUNTER — Encounter: Payer: Self-pay | Admitting: Internal Medicine

## 2016-12-03 ENCOUNTER — Ambulatory Visit (HOSPITAL_COMMUNITY): Payer: Self-pay | Admitting: Pharmacist

## 2016-12-03 LAB — POCT INR: INR: 2.4

## 2016-12-06 ENCOUNTER — Other Ambulatory Visit (HOSPITAL_COMMUNITY): Payer: Self-pay | Admitting: Internal Medicine

## 2016-12-09 ENCOUNTER — Other Ambulatory Visit (HOSPITAL_COMMUNITY): Payer: Self-pay | Admitting: Unknown Physician Specialty

## 2016-12-09 ENCOUNTER — Ambulatory Visit (HOSPITAL_COMMUNITY)
Admission: RE | Admit: 2016-12-09 | Discharge: 2016-12-09 | Disposition: A | Discharge status: Home / Self Care | Payer: Medicare Other | Source: Ambulatory Visit | Attending: Cardiology | Admitting: Cardiology

## 2016-12-09 ENCOUNTER — Ambulatory Visit (HOSPITAL_COMMUNITY): Payer: Self-pay | Admitting: Pharmacist

## 2016-12-09 VITALS — BP 120/0 | HR 81 | Ht 60.0 in | Wt 180.0 lb

## 2016-12-09 DIAGNOSIS — Z95811 Presence of heart assist device: Secondary | ICD-10-CM | POA: Insufficient documentation

## 2016-12-09 DIAGNOSIS — K8000 Calculus of gallbladder with acute cholecystitis without obstruction: Secondary | ICD-10-CM | POA: Insufficient documentation

## 2016-12-09 DIAGNOSIS — I480 Paroxysmal atrial fibrillation: Secondary | ICD-10-CM | POA: Insufficient documentation

## 2016-12-09 DIAGNOSIS — I429 Cardiomyopathy, unspecified: Secondary | ICD-10-CM | POA: Diagnosis not present

## 2016-12-09 DIAGNOSIS — I5022 Chronic systolic (congestive) heart failure: Secondary | ICD-10-CM

## 2016-12-09 DIAGNOSIS — Z95810 Presence of automatic (implantable) cardiac defibrillator: Secondary | ICD-10-CM | POA: Insufficient documentation

## 2016-12-09 DIAGNOSIS — I10 Essential (primary) hypertension: Secondary | ICD-10-CM | POA: Diagnosis not present

## 2016-12-09 DIAGNOSIS — Z7901 Long term (current) use of anticoagulants: Secondary | ICD-10-CM

## 2016-12-09 DIAGNOSIS — I110 Hypertensive heart disease with heart failure: Secondary | ICD-10-CM | POA: Diagnosis not present

## 2016-12-09 DIAGNOSIS — K644 Residual hemorrhoidal skin tags: Secondary | ICD-10-CM | POA: Diagnosis not present

## 2016-12-09 DIAGNOSIS — Z86010 Personal history of colonic polyps: Secondary | ICD-10-CM | POA: Insufficient documentation

## 2016-12-09 DIAGNOSIS — Z79899 Other long term (current) drug therapy: Secondary | ICD-10-CM | POA: Diagnosis not present

## 2016-12-09 DIAGNOSIS — R42 Dizziness and giddiness: Secondary | ICD-10-CM | POA: Insufficient documentation

## 2016-12-09 DIAGNOSIS — R0600 Dyspnea, unspecified: Secondary | ICD-10-CM | POA: Diagnosis not present

## 2016-12-09 DIAGNOSIS — M8580 Other specified disorders of bone density and structure, unspecified site: Secondary | ICD-10-CM | POA: Diagnosis not present

## 2016-12-09 DIAGNOSIS — Z8674 Personal history of sudden cardiac arrest: Secondary | ICD-10-CM | POA: Diagnosis not present

## 2016-12-09 DIAGNOSIS — I50810 Right heart failure, unspecified: Secondary | ICD-10-CM | POA: Diagnosis not present

## 2016-12-09 LAB — CBC
HCT: 35.5 % — ABNORMAL LOW (ref 36.0–46.0)
Hemoglobin: 11 g/dL — ABNORMAL LOW (ref 12.0–15.0)
MCH: 29.3 pg (ref 26.0–34.0)
MCHC: 31 g/dL (ref 30.0–36.0)
MCV: 94.7 fL (ref 78.0–100.0)
Platelets: 230 10*3/uL (ref 150–400)
RBC: 3.75 MIL/uL — ABNORMAL LOW (ref 3.87–5.11)
RDW: 17.5 % — ABNORMAL HIGH (ref 11.5–15.5)
WBC: 4.5 10*3/uL (ref 4.0–10.5)

## 2016-12-09 LAB — BASIC METABOLIC PANEL
Anion gap: 6 (ref 5–15)
BUN: 7 mg/dL (ref 6–20)
CO2: 21 mmol/L — ABNORMAL LOW (ref 22–32)
Calcium: 9 mg/dL (ref 8.9–10.3)
Chloride: 111 mmol/L (ref 101–111)
Creatinine, Ser: 0.99 mg/dL (ref 0.44–1.00)
GFR calc Af Amer: >60 mL/min (ref >60)
GFR calc non Af Amer: 52 mL/min — ABNORMAL LOW (ref >60)
Glucose, Bld: 96 mg/dL (ref 65–99)
Potassium: 4.1 mmol/L (ref 3.5–5.1)
Sodium: 138 mmol/L (ref 135–145)

## 2016-12-09 LAB — LACTATE DEHYDROGENASE: LDH: 212 U/L — ABNORMAL HIGH (ref 98–192)

## 2016-12-09 LAB — PROTIME-INR
INR: 2
Prothrombin Time: 23 seconds — ABNORMAL HIGH (ref 11.4–15.2)

## 2016-12-09 MED ORDER — FUROSEMIDE 20 MG PO TABS: 20.0000 mg | ORAL_TABLET | Freq: Every day | ORAL | 1 refills | Status: DC | PRN

## 2016-12-09 MED ORDER — LOSARTAN POTASSIUM 50 MG PO TABS
100.0000 mg | ORAL_TABLET | Freq: Every day | ORAL | 6 refills | Status: DC
Start: 2016-12-09 — End: 2017-01-08

## 2016-12-09 NOTE — Progress Notes (Signed)
ADVANCED HF CLINIC NOTE  Patient ID: Brianna Hanson, female   DOB: [birth year], 82 y.o.   MRN: [id number] Primary Heart Failure: Dr Haroldine Laws Cardiac Surgeon: Dr Prescott Gum  HPI: Brianna Hanson is a 82  year old female with a PMH of HF due to severe NICM, chronic systolic HF,  PAF,  plasma cell disorder (Likely IgA MGUS) - followed by Dr. [name] (follwoed q 6 months). Underwent implantation of the HeartMate II LVAD on 04/06/13 for DT. She is not on aspirin due to dizziness. S/P lap cholecystitis 10/2015.   GI Issues 09/2016 LGIB. [name] Angio- no source of bleeding. LVAD speed turned down to 8400  Today she returns for LVAD/HF follow up. Complaining of fatigue. Over the last 2 weeks she has reported increased sob + orthopnea. Denies SOB at rest. Weight at home 170-173 pounds. Yesterday she had an episode of presyncope while standing. No syncope. Taking all medications. Small amount bleeding with bowel movements. No hematuria. Denies driveline trauma, erythema or drainage.   VAD Indication:DT    VAD interrogation & Equipment Management: Speed: 8400 Flow: 3.5  Power: 4.0  PI: 6.7  Alarms: no clinical alarms Events: Low Flow 4/9 x2, 4/8 x1, 3/20 x4, 3/17 x3, 3/12 x34.  Fixed speed 8400 Low speed limit: 8000  Primary Controller: Replace in 54months. Back up controller: Replace back up battery in 49months.  Past Medical History:  Diagnosis Date  . AICD (automatic cardioverter/defibrillator) present   . Anemia   . Atrial fibrillation or flutter   . Cardiac arrest - ventricular fibrillation 12/10   with successful resucitation, S/p ICD  . CHF (congestive heart failure) ([place])   . Diverticula of colon 2011  . HTN (hypertension)    moderate  . ICD (implantable cardiac defibrillator) in place    she has received appropriate therapy for VF  . Internal and external hemorrhoids without complication 6283  . Nonischemic cardiomyopathy ([place])    followed by Dr April Holding at [facility]  . Osteopenia     . Plasma cell disorder 03/20/2012  . Plasma cell disorder 03/20/2012  . Sessile colonic polyp 2011   Dr Benson [place]    Current Outpatient Prescriptions  Medication Sig Dispense Refill  . amiodarone (PACERONE) 200 MG tablet TAKE 1 TABLET BY MOUTH EVERY DAY 30 tablet 2  . carvedilol (COREG) 6.25 MG tablet TAKE 1 TABLET (6.25 MG TOTAL) BY MOUTH 2 (TWO) TIMES DAILY WITH A MEAL. 60 tablet 11  . Cholecalciferol (VITAMIN D3) 1000 UNITS CAPS Take 1 tablet by mouth daily.      [name] docusate sodium (COLACE) 100 MG capsule Take 1 capsule (100 mg total) by mouth 2 (two) times daily. (Patient taking differently: Take 100 mg by mouth daily. ) 60 capsule 6  . ferrous sulfate 325 (65 FE) MG tablet Take 1 tablet (325 mg total) by mouth 2 (two) times daily with a meal. 60 tablet 6  . gabapentin (NEURONTIN) 100 MG capsule TAKE ONE CAPSULE BY MOUTH AT BEDTIME OR AS DIRECTED 30 capsule 5  . hydrALAZINE (APRESOLINE) 50 MG tablet Take 1.5 tablets (75 mg total) by mouth 3 (three) times daily. 270 tablet 3  . losartan (COZAAR) 50 MG tablet Take 2 tablets (100 mg total) by mouth daily. 60 tablet 6  . pantoprazole (PROTONIX) 40 MG tablet TAKE 1 TABLET BY MOUTH EVERY DAY 30 tablet 6  . potassium chloride SA (KLOR-CON M20) 20 MEQ tablet Take 1 tablet (20 mEq total) by mouth 2 (two) times daily.  60 tablet 11  . traMADol (ULTRAM) 50 MG tablet Take 2 tablets (100 mg total) by mouth every 12 (twelve) hours as needed for moderate pain. 60 tablet 3  . warfarin (COUMADIN) 2.5 MG tablet Take 5 mg daily, except take 2.5 mg Tuesdays and Saturdays. 60 tablet 6  . PREVNAR 13 SUSP injection TO BE ADMINISTERED BY PHARMACIST FOR IMMUNIZATION  0   Current Facility-Administered Medications  Medication Dose Route Frequency Provider Last Rate Last Dose  . methylPREDNISolone acetate (DEPO-MEDROL) injection 40 mg  40 mg Intra-articular Once Thurman Coyer, DO        Patient has no known allergies.  REVIEW OF SYSTEMS: All systems negative except  as listed in HPI, PMH and Problem list.   Vital Signs:  Doppler Pressure 120              Automatc BP: 126/80 HR: 81 SPO2: 94%  Weight: 180 pounds Last weight: 180.4 lb Home weights: 170-173 pounds  There is no height or weight on file to calculate BMI.  Physical Exam: GENERAL: NADfemale, NAD, [name] present. Ambulated slowly in the clinic.   HEENT: normal  NECK: Supple, JVP 9-10,  2+ bilaterally, no bruits.  No lymphadenopathy or thyromegaly appreciated.   CARDIAC: LVAD hum present.   LUNGS:  CTAB on room air.   ABDOMEN:  Soft, NT, ND, + bs.  LVAD exit site: Dressing dry and intact.  No erythema or drainage.  Stabilization device present and accurately applied. Driveline dressing changed weekly. EXTREMITIES:  Warm and dry, no cyanosis, clubbing, rash, trace edema R>L NEUROLOGIC:  A&O x3. MAE x4..     ASSESSMENT AND PLAN:  1) Chronic systolic HF: NICM, s/p ICD and LVAD for DT(04/2013).  - Doing well s/p recent hospitalization for LGIB.   NYHA III. Functional decline. Ambulated in the clinic with dyspnea but O2 sats did not drop below 88%. Stable hemog Has had a few low flow alarms in April. Has had an ECHO that showed the cannula was toward the septum.  Volume status mildly elevated. Will give a small dose of lasix 20 mg today to see if that helps.  Do the following things EVERYDAY: 1) Weigh yourself in the morning before breakfast. Write it down and keep it in a log. 2) Take your medicines as prescribed 3) Eat low salt foods-Limit salt (sodium) to 2000 mg per day.  4) Stay as active as you can everyday Limit all fluids for the day to less than 2 liters 2) Acute cholecystitis: Now s/p lap cholecystectomy.  Resolved.  3) LVAD placed for DT 04/2013:  VAD parameters reviewed. LF alarms 4/9, 4/8, 3/20, 3/17, 3/12.  Check LDH, CBC, BMET, INR.  Labs stable today. LDH was stable. Remains on coumadin.   Not on aspirin due to intolerance. 4) PAF: Regular pulse. Continue amio 100 mg  daily. Follow TSH and LFTs.  Needs year eye exams.    5) HTN: Elevated today. May need to add low dose spiro at next office visit.  6) Recent LGIB: - likely diverticular. Has resolved Check labs today. Hemoglobin stable. 11.0.  7) Chronic Anticoagulation: Goal INR 2-3 Continue coumadin. INR today 8) Dyspnea- O2 sats checked in the clinic with ambulation. O2 sats stable. Hgb and LDH stable so I wonder if this is related to mild volume overload.   Follow up in 2 weeks.    Amy Clegg NP-C  12/09/2016  ADVANCED HF CLINIC NOTE  Patient ID: Brianna Hanson, female   DOB: 17-May-1935, 82 y.o.   MRN: [id number] Primary Heart Failure: Dr Haroldine Laws Cardiac Surgeon: Dr Prescott Gum  HPI: Brianna Hanson is a 82 year old female with a PMH of HF due to severe NICM, chronic systolic HF,  PAF,  plasma cell disorder (Likely IgA MGUS) - followed by Dr. [name] (follwoed q 6 months). Underwent implantation of the HeartMate II LVAD on 04/06/13 for DT. She is not on aspirin due to dizziness.   Admitted to [facility] from 12/4 to 08/18/15 for acute cholecystitis. Underwent placement of a cholecystotomy drain by IR.  Seen in IR drain clinic and cholangiogram showed patent ducts with residual gallstones.  She was admitted in 2/17 for laparoscopic cholecystectomy.  This was successful.   Admitted 1/18 for LGIB felt to be diverticular. Bleeding scan + so underwent visceral angio in IR and no source of bleeding found. Bleeding eventually resolved. Hospitalization c/b low flows on VAD and given IVF. Speed turned down to 8400   She returns for post-hospital follow up. Doing very well. Doing all ADLs and going out without any SOB or any limitations. Bleeding has resolved. No low flow alarms. Says flows are better when she is walking around.  Denies CP, edema, orthopnea, PND. No focal neuro symptoms. Main complaint is knee pain.   Denies driveline trauma, erythema or drainage.  Denies ICD  shocks.   Reports taking Coumadin as prescribed.  Denies bright red blood per rectum or melena, no dark urine or hematuria.    Labs (3/17): hgb 11.4, INR 2.36    VAD Indication: Destination therapy    VAD interrogation & Equipment Management: Speed:8400 Flow: 3.6 Power:3.9 w PI:7.2  Alarms: no clinical alarms Events: 2-5 low flow alarms since discharge. 2-3 PI events daily.  Fixed speed 8400 Low speed limit: 8000  Primary Controller: Replace back up battery in 41months. Back up controller: Replace back up battery in 38months.  Past Medical History:  Diagnosis Date  . AICD (automatic cardioverter/defibrillator) present   . Anemia   . Atrial fibrillation or flutter   . Cardiac arrest - ventricular fibrillation 12/10   with successful resucitation, S/p ICD  . CHF (congestive heart failure) ([place])   . Diverticula of colon 2011  . HTN (hypertension)    moderate  . ICD (implantable cardiac defibrillator) in place    she has received appropriate therapy for VF  . Internal and external hemorrhoids without complication 6712  . Nonischemic cardiomyopathy ([place])    followed by Dr April [facility] at [facility]  . Osteopenia   . Plasma cell disorder 03/20/2012  . Plasma cell disorder 03/20/2012  . Sessile colonic polyp 2011   Dr Benson [place]    Current Outpatient Prescriptions  Medication Sig Dispense Refill  . amiodarone (PACERONE) 200 MG tablet TAKE 1 TABLET BY MOUTH EVERY DAY 30 tablet 2  . carvedilol (COREG) 6.25 MG tablet TAKE 1 TABLET (6.25 MG TOTAL) BY MOUTH 2 (TWO) TIMES DAILY WITH A MEAL. 60 tablet 11  . Cholecalciferol (VITAMIN D3) 1000 UNITS CAPS Take 1 tablet by mouth daily.      [name] docusate sodium (COLACE) 100 MG capsule Take 1 capsule (100 mg total) by mouth 2 (two) times daily. (Patient taking differently: Take 100 mg by mouth daily. ) 60 capsule 6  . ferrous sulfate 325 (65 FE) MG tablet Take 1 tablet (325 mg total) by mouth 2 (two) times daily with a meal. 60 tablet 6    .  gabapentin (NEURONTIN) 100 MG capsule TAKE ONE CAPSULE BY MOUTH AT BEDTIME OR AS DIRECTED 30 capsule 5  . hydrALAZINE (APRESOLINE) 50 MG tablet Take 1.5 tablets (75 mg total) by mouth 3 (three) times daily. 270 tablet 3  . losartan (COZAAR) 50 MG tablet Take 2 tablets (100 mg total) by mouth daily. 60 tablet 6  . pantoprazole (PROTONIX) 40 MG tablet TAKE 1 TABLET BY MOUTH EVERY DAY 30 tablet 6  . potassium chloride SA (KLOR-CON M20) 20 MEQ tablet Take 1 tablet (20 mEq total) by mouth 2 (two) times daily. 60 tablet 11  . traMADol (ULTRAM) 50 MG tablet Take 2 tablets (100 mg total) by mouth every 12 (twelve) hours as needed for moderate pain. 60 tablet 3  . warfarin (COUMADIN) 2.5 MG tablet Take 5 mg daily, except take 2.5 mg Tuesdays and Saturdays. 60 tablet 6  . PREVNAR 13 SUSP injection TO BE ADMINISTERED BY PHARMACIST FOR IMMUNIZATION  0   Current Facility-Administered Medications  Medication Dose Route Frequency Provider Last Rate Last Dose  . methylPREDNISolone acetate (DEPO-MEDROL) injection 40 mg  40 mg Intra-articular Once Thurman Coyer, DO        Patient has no known allergies.  REVIEW OF SYSTEMS: All systems negative except as listed in HPI, PMH and Problem list.   Vital Signs:  Doppler Pressure 100              Automatc BP: 117/88 (98) HR:76  SPO2:97  %  Weight: 178.2 lb w/o eqt Last weight: 180.4 lb Home weights: 180 lbs There is no height or weight on file to calculate BMI.  Physical Exam: GENERAL: Well appearing, female, NAD, [name] present. Ambulated in the clinic without difficulty.  HEENT: normal  NECK: Supple, JVP flat,  2+ bilaterally, no bruits.  No lymphadenopathy or thyromegaly appreciated.   CARDIAC:  LVAD hum present.  LUNGS:  Clear to auscultation bilaterally.  ABDOMEN:  Soft, round, nontender, positive bowel sounds. LVAD exit site: Dressing dry and intact.  No erythema or drainage.  Stabilization device present and accurately applied. Driveline  dressing changed weekly. EXTREMITIES:  Warm and dry, no cyanosis, clubbing, rash, trace edema R>L NEUROLOGIC:  Alert and oriented x 4.  Gait steady.  No aphasia.  No dysarthria.  Affect pleasant.     ASSESSMENT AND PLAN:  1) Chronic systolic HF: NICM, s/p ICD and LVAD for DT(04/2013).  - Doing well s/p recent hospitalization for LGIB.  NYHA I-II. Volume status stable.  Does not take diuretics. Off spiro due to volume depletion  - Low flow alarms on VAD have resolved on 8400. Echo show cannula angulated toward septum. Need to keep volume up.  - Reinforced the need and importance of daily weights, a low sodium diet, and fluid restriction (less than 2 L a day). Instructed to call the HF clinic if weight increases more than 3 lbs overnight or 5 lbs in a week.  2) Acute cholecystitis: Now s/p lap cholecystectomy.  Resolved.  3) LVAD placed for DT 04/2013: All parameters stable (reviewed personally).  - Check LDH, INR, CBC and BMET today.  - Remains on coumadin.   Not on aspirin due to intolerance. 4) PAF: Stable. Continue amiodarone to 100 mg daily. Follow TSH and LFTs.  Needs year eye exams.    5) HTN: BP elevated today in setting of not taking meds this am. Continue current regimen. Would not switch losartan to Entresto as diuretic effect may worsen low flows.   6) Recent  LGIB: - likely diverticular. Has resolved Check labs today.  7) Chronic Anticoagulation: Goal INR 2-3 Continue coumadin. Check INR  Darrick Grinder MD   12/09/2016                               ADVANCED HF CLINIC NOTE  Patient ID: Brianna Hanson, female   DOB: May 26, 1935, 82 y.o.   MRN: [id number] Primary Heart Failure: Dr Haroldine Laws Cardiac Surgeon: Dr Prescott Gum  HPI: Brianna Hanson is a 82 year old female with a PMH of HF due to severe NICM, chronic systolic HF,  PAF,  plasma cell disorder (Likely IgA MGUS) - followed by Dr. [name] (follwoed q 6 months). Underwent implantation of the HeartMate II LVAD on  04/06/13 for DT. She is not on aspirin due to dizziness.   Admitted to [facility] from 12/4 to 08/18/15 for acute cholecystitis. Underwent placement of a cholecystotomy drain by IR.  Seen in IR drain clinic and cholangiogram showed patent ducts with residual gallstones.  She was admitted in 2/17 for laparoscopic cholecystectomy.  This was successful.   Admitted 1/18 for LGIB felt to be diverticular. Bleeding scan + so underwent visceral angio in IR and no source of bleeding found. Bleeding eventually resolved. Hospitalization c/b low flows on VAD and given IVF. Speed turned down to 8400   She returns for post-hospital follow up. Doing very well. Doing all ADLs and going out without any SOB or any limitations. Bleeding has resolved. No low flow alarms. Says flows are better when she is walking around.  Denies CP, edema, orthopnea, PND. No focal neuro symptoms. Main complaint is knee pain.   Denies driveline trauma, erythema or drainage.  Denies ICD shocks.   Reports taking Coumadin as prescribed.  Denies bright red blood per rectum or melena, no dark urine or hematuria.    Labs (3/17): hgb 11.4, INR 2.36    VAD Indication: Destination therapy    VAD interrogation & Equipment Management: Speed:8400 Flow: 3.6 Power:3.9 w PI:7.2  Alarms: no clinical alarms Events: 2-5 low flow alarms since discharge. 2-3 PI events daily.  Fixed speed 8400 Low speed limit: 8000  Primary Controller: Replace back up battery in 27months. Back up controller: Replace back up battery in 71months.  Past Medical History:  Diagnosis Date  . AICD (automatic cardioverter/defibrillator) present   . Anemia   . Atrial fibrillation or flutter   . Cardiac arrest - ventricular fibrillation 12/10   with successful resucitation, S/p ICD  . CHF (congestive heart failure) ([place])   . Diverticula of colon 2011  . HTN (hypertension)    moderate  . ICD (implantable cardiac defibrillator) in place    she has received  appropriate therapy for VF  . Internal and external hemorrhoids without complication 7322  . Nonischemic cardiomyopathy ([place])    followed by Dr April Holding at [facility]  . Osteopenia   . Plasma cell disorder 03/20/2012  . Plasma cell disorder 03/20/2012  . Sessile colonic polyp 2011   Dr Benson Norway    Current Outpatient Prescriptions  Medication Sig Dispense Refill  . amiodarone (PACERONE) 200 MG tablet TAKE 1 TABLET BY MOUTH EVERY DAY 30 tablet 2  . carvedilol (COREG) 6.25 MG tablet TAKE 1 TABLET (6.25 MG TOTAL) BY MOUTH 2 (TWO) TIMES DAILY WITH A MEAL. 60 tablet 11  . Cholecalciferol (VITAMIN D3) 1000 UNITS CAPS Take 1 tablet by mouth daily.      [name] docusate  sodium (COLACE) 100 MG capsule Take 1 capsule (100 mg total) by mouth 2 (two) times daily. (Patient taking differently: Take 100 mg by mouth daily. ) 60 capsule 6  . ferrous sulfate 325 (65 FE) MG tablet Take 1 tablet (325 mg total) by mouth 2 (two) times daily with a meal. 60 tablet 6  . gabapentin (NEURONTIN) 100 MG capsule TAKE ONE CAPSULE BY MOUTH AT BEDTIME OR AS DIRECTED 30 capsule 5  . hydrALAZINE (APRESOLINE) 50 MG tablet Take 1.5 tablets (75 mg total) by mouth 3 (three) times daily. 270 tablet 3  . losartan (COZAAR) 50 MG tablet Take 2 tablets (100 mg total) by mouth daily. 60 tablet 6  . pantoprazole (PROTONIX) 40 MG tablet TAKE 1 TABLET BY MOUTH EVERY DAY 30 tablet 6  . potassium chloride SA (KLOR-CON M20) 20 MEQ tablet Take 1 tablet (20 mEq total) by mouth 2 (two) times daily. 60 tablet 11  . traMADol (ULTRAM) 50 MG tablet Take 2 tablets (100 mg total) by mouth every 12 (twelve) hours as needed for moderate pain. 60 tablet 3  . warfarin (COUMADIN) 2.5 MG tablet Take 5 mg daily, except take 2.5 mg Tuesdays and Saturdays. 60 tablet 6  . PREVNAR 13 SUSP injection TO BE ADMINISTERED BY PHARMACIST FOR IMMUNIZATION  0   Current Facility-Administered Medications  Medication Dose Route Frequency Provider Last Rate Last Dose  .  methylPREDNISolone acetate (DEPO-MEDROL) injection 40 mg  40 mg Intra-articular Once Thurman Coyer, DO        Patient has no known allergies.  REVIEW OF SYSTEMS: All systems negative except as listed in HPI, PMH and Problem list.   Vital Signs:  Doppler Pressure              Automatc BP:  HR:  SPO2:  Weight:  Last weight: 178.2 pounds  Home weights:  There is no height or weight on file to calculate BMI.  Physical Exam: General:  Well appearing. No resp difficulty HEENT: normal Neck: supple. no JVD. Carotids 2+ bilat; no bruits. No lymphadenopathy or thryomegaly appreciated. Cor: LVAD hum  Lungs: clear Abdomen: soft, nontender, nondistended. No hepatosplenomegaly. No bruits or masses. Good bowel sounds. Extremities: no cyanosis, clubbing, rash, edema Neuro: alert & orientedx3, cranial nerves grossly intact. moves all 4 extremities w/o difficulty. Affect pleasant SKin:  LVAD exit site. Dressing dry and intact. No erythema or drainage. Stabilization device present and accurately applied. Driveline changed weekly.   ant.     ASSESSMENT AND PLAN:  1) Chronic systolic HF: NICM, s/p ICD and LVAD for DT(04/2013).   NYHA I-II. Volume status stable.  Does not take diuretics. Off spiro due to volume depletion  - Low flow alarms on VAD have resolved on 8400. Echo show cannula angulated toward septum. Need to keep volume up.  - Reinforced the need and importance of daily weights, a low sodium diet, and fluid restriction (less than 2 L a day). Instructed to call the HF clinic if weight increases more than 3 lbs overnight or 5 lbs in a week.  2) Acute cholecystitis: Resolved. S/P Lap Cholecystectomy.  3) LVAD placed for DT 04/2013:   -Remain on coumadin.   Not on aspirin due to intolerance. 4) PAF: Stable. Continue amiodarone to 100 mg daily. Follow TSH and LFTs.  Needs year eye exams.    5) HTN:  Would not switch losartan to Entresto as diuretic effect may worsen low flows.   6)  Recent LGIB: - Recent diverticular  bleed. CBC today.   7) Chronic Anticoagulation: Goal INR 2-3 Continue coumadin. Check INR  Follow up  [place] NP-C  12/09/2016

## 2016-12-09 NOTE — Progress Notes (Signed)
Patient presents for 2 month fu in clinic today.  Vital Signs:  Doppler Pressure 120  Automatc BP: 126/80 (101) HR: 81  SPO2:94  % Pt walked SPO2 only dropped to 93%  Weight: 180 lb w/o eqt Last weight: 178.2 lb Home weights: 180 lbs Body mass index is 35.15 kg/m.   VAD Indication: Destination therapy    VAD interrogation & Equipment Management: Speed:8400 Flow: 3.5 Power:4.0 w    PI:6.7  Alarms: no clinical alarms Events: 3/12-pt had 30+ low flow alarms, 3/17 x3, 3/20 x 4, 4/8 x 1, 4/9 x2 , 2-3 PI events daily.  Fixed speed 8400 Low speed limit: 8000  Primary Controller:  Replace back up battery in 63months. Back up controller:   Replace back up battery in 18 months.  Annual Equipment Maintenance on UBC/PM was performed on 04/2017.   I reviewed the LVAD parameters from today and compared the results to the patient's prior recorded data. LVAD interrogation was NEGATIVE for significant power changes, POSITIVE for clinical alarms (no audible alarms)and STABLE for PI events/speed drops. No programming changes were made and pump is functioning within specified parameters. Pt is performing daily controller and system monitor self tests along with completing weekly and monthly maintenance for LVAD equipment.  LVAD equipment check completed and is in good working order. Back-up equipment present. Charged back up battery and performed self-test on equipment.   Exit Site Care: Drive line is being maintained weekly  by husband. Drive line exit site well healed and incorporated. The velour is fully implanted at exit site. Dressing dry and intact. No erythema or drainage. Stabilization device present and accurately applied. Pt denies fever or chills. Pt states they have adequate dressing supplies at home.   Significant Events on VAD Support:  09/21/16 GIB, Ramp echo speed decreased to 8400   Device:St jude ICD Therapies: on Last check:    BP & Labs:  MAP 120- Doppler  is reflecting modified systolic  Hgb 11 - No S/S of bleeding. Specifically denies melena/BRBPR or nosebleeds.  LDH stable at 212 with established baseline of 170- 230. Denies tea-colored urine. No power elevations noted on interrogation.   Plan: 1. Take Lasix 20 mg today. 2. Return to clinic in 2 weeks.  Tanda Rockers RN [place] Coordinator   Office: [phone] 24/7 Emergency VAD Pager: [phone]

## 2016-12-09 NOTE — Patient Instructions (Signed)
Take 20 mg lasix once today only.  Follow up 1 month.  Do the following things EVERYDAY: 1) Weigh yourself in the morning before breakfast. Write it down and keep it in a log. 2) Take your medicines as prescribed 3) Eat low salt foods-Limit salt (sodium) to 2000 mg per day.  4) Stay as active as you can everyday 5) Limit all fluids for the day to less than 2 liters

## 2016-12-11 ENCOUNTER — Ambulatory Visit (HOSPITAL_COMMUNITY): Payer: Self-pay | Admitting: Pharmacist

## 2016-12-11 LAB — POCT INR: INR: 2

## 2016-12-16 NOTE — Addendum Note (Signed)
Encounter addended by: Conrad Movico, NP on: 12/16/2016 12:06 PM<BR>    Actions taken: LOS modified

## 2016-12-17 ENCOUNTER — Ambulatory Visit (HOSPITAL_COMMUNITY): Payer: Self-pay | Admitting: Pharmacist

## 2016-12-17 LAB — POCT INR: INR: 3.1

## 2016-12-23 ENCOUNTER — Encounter (HOSPITAL_COMMUNITY): Payer: Medicare Other

## 2016-12-24 ENCOUNTER — Ambulatory Visit (HOSPITAL_COMMUNITY): Payer: Self-pay | Admitting: Pharmacist

## 2016-12-24 ENCOUNTER — Other Ambulatory Visit (HOSPITAL_COMMUNITY): Payer: Self-pay | Admitting: Cardiology

## 2016-12-24 DIAGNOSIS — Z7901 Long term (current) use of anticoagulants: Secondary | ICD-10-CM | POA: Diagnosis not present

## 2016-12-24 LAB — POCT INR: INR: 2.5

## 2016-12-25 ENCOUNTER — Ambulatory Visit (HOSPITAL_COMMUNITY)
Admission: RE | Admit: 2016-12-25 | Discharge: 2016-12-25 | Disposition: A | Discharge status: Home / Self Care | Payer: Medicare Other | Source: Ambulatory Visit | Attending: Internal Medicine | Admitting: Internal Medicine

## 2016-12-25 ENCOUNTER — Ambulatory Visit (HOSPITAL_COMMUNITY): Payer: Self-pay | Admitting: Pharmacist

## 2016-12-25 VITALS — BP 131/96 | HR 79 | Wt 174.6 lb

## 2016-12-25 DIAGNOSIS — J984 Other disorders of lung: Secondary | ICD-10-CM | POA: Insufficient documentation

## 2016-12-25 DIAGNOSIS — Z950 Presence of cardiac pacemaker: Secondary | ICD-10-CM | POA: Insufficient documentation

## 2016-12-25 DIAGNOSIS — I5022 Chronic systolic (congestive) heart failure: Secondary | ICD-10-CM

## 2016-12-25 DIAGNOSIS — I700 Atherosclerosis of aorta: Secondary | ICD-10-CM | POA: Diagnosis not present

## 2016-12-25 DIAGNOSIS — R079 Chest pain, unspecified: Secondary | ICD-10-CM | POA: Diagnosis not present

## 2016-12-25 LAB — BASIC METABOLIC PANEL
Anion gap: 7 (ref 5–15)
BUN: 11 mg/dL (ref 6–20)
CO2: 24 mmol/L (ref 22–32)
Calcium: 9 mg/dL (ref 8.9–10.3)
Chloride: 109 mmol/L (ref 101–111)
Creatinine, Ser: 1.08 mg/dL — ABNORMAL HIGH (ref 0.44–1.00)
GFR calc Af Amer: 55 mL/min — ABNORMAL LOW (ref >60)
GFR calc non Af Amer: 47 mL/min — ABNORMAL LOW (ref >60)
Glucose, Bld: 97 mg/dL (ref 65–99)
Potassium: 3.5 mmol/L (ref 3.5–5.1)
Sodium: 140 mmol/L (ref 135–145)

## 2016-12-25 LAB — CBC
HCT: 36.3 % (ref 36.0–46.0)
Hemoglobin: 11.2 g/dL — ABNORMAL LOW (ref 12.0–15.0)
MCH: 29.6 pg (ref 26.0–34.0)
MCHC: 30.9 g/dL (ref 30.0–36.0)
MCV: 95.8 fL (ref 78.0–100.0)
Platelets: 179 10*3/uL (ref 150–400)
RBC: 3.79 MIL/uL — ABNORMAL LOW (ref 3.87–5.11)
RDW: 17.7 % — ABNORMAL HIGH (ref 11.5–15.5)
WBC: 4.4 10*3/uL (ref 4.0–10.5)

## 2016-12-25 LAB — PROTIME-INR
INR: 2.44
Prothrombin Time: 26.9 seconds — ABNORMAL HIGH (ref 11.4–15.2)

## 2016-12-25 LAB — LACTATE DEHYDROGENASE: LDH: 225 U/L — ABNORMAL HIGH (ref 98–192)

## 2016-12-25 MED ORDER — HYDRALAZINE HCL 50 MG PO TABS: 50.0000 mg | ORAL_TABLET | Freq: Three times a day (TID) | ORAL | 0 refills | Status: DC

## 2016-12-25 MED ORDER — WARFARIN SODIUM 2.5 MG PO TABS: ORAL_TABLET | ORAL | 6 refills | Status: DC

## 2016-12-25 MED ORDER — GABAPENTIN 100 MG PO CAPS: 100.0000 mg | ORAL_CAPSULE | Freq: Every day | ORAL | 5 refills | Status: DC

## 2016-12-25 NOTE — Progress Notes (Signed)
Patient presents for 2 week follow up in [facility] today. Reports no problems with VAD equipment or concerns with drive line. Complains of shortness of breath and chronic cough. 2 view chest xray obtained. VAD speeds adjusted per MD- see below.   Vital Signs:  Doppler Pressure 125   Automatc BP: 131/96 b(106) HR:79   SPO2:95  %  Weight: 174.6 lb w/o eqt Last weight: 180 lb  VAD Indication: Destination Therapy    VAD interrogation & Equipment Management: Speed:8400 Flow: 3.6 Power:3.9 w    PI:6.8  Alarms: no clinical alarms Events: 19 low flow alarms 4/9, 4/13 had 4 low flow alarms  Fixed speed 8400 (changed to 8800) Low speed limit: 8000 (changed to 8200)  Primary Controller:  Replace back up battery in 53months. Back up controller:   Replace back up battery in 18 months.  Annual Equipment Maintenance on UBC/PM was performed on 04/2017.   I reviewed the LVAD parameters from today and compared the results to the patient's prior recorded data. LVAD interrogation was NEGATIVE for significant power changes, NEGATIVE for clinical alarms and STABLE for PI events/speed drops. No programming changes were made and pump is functioning within specified parameters. Pt is performing daily controller and system monitor self tests along with completing weekly and monthly maintenance for LVAD equipment.  LVAD equipment check completed and is in good working order. Back-up equipment present. Charged back up battery and performed self-test on equipment.   Exit Site Care: Drive line is being maintained weekly  by [employer]. Drive line exit site well healed and incorporated. The velour is fully implanted at exit site. Dressing dry and intact. No erythema or drainage. Stabilization device present and accurately applied. Pt denies fever or chills. Pt states they have adequate dressing supplies at home.   Significant Events on VAD Support:  09/21/16 GIB, Ramp echo speed decreased to 8400    Device:St Jude ICD Therapies: on Last check: appt this June for device check   BP & Labs:  MBT597 - Doppler is reflecting modified systolic  Hgb 41.6 - No S/S of bleeding. Specifically denies melena/BRBPR or nosebleeds.  LDH stable at 225 with established baseline of 170- 230. Denies tea-colored urine. No power elevations noted on interrogation.   Plan: 1. Fixed speed adjusted to 8800 with low speed limit to 8200. Backup controller adjusted. 2. Increase Hydralazine to 100 mg TID. 3. Return to clinic in 2 weeks.  Balinda Quails RN [place] Coordinator   Office: [phone] 24/7 Emergency VAD Pager: [phone]

## 2016-12-31 ENCOUNTER — Ambulatory Visit (HOSPITAL_COMMUNITY): Payer: Self-pay | Admitting: Pharmacist

## 2016-12-31 LAB — POCT INR: INR: 2.3

## 2017-01-02 ENCOUNTER — Telehealth (HOSPITAL_COMMUNITY): Payer: Self-pay | Admitting: Cardiology

## 2017-01-02 NOTE — Telephone Encounter (Signed)
Pharmacy fax request 90 day supply- sent to pharmacist to review

## 2017-01-03 ENCOUNTER — Encounter: Payer: Medicare Other | Admitting: Internal Medicine

## 2017-01-07 ENCOUNTER — Ambulatory Visit (HOSPITAL_COMMUNITY): Payer: Self-pay | Admitting: Pharmacist

## 2017-01-07 LAB — POCT INR: INR: 2.1

## 2017-01-08 ENCOUNTER — Ambulatory Visit (HOSPITAL_COMMUNITY): Payer: Self-pay | Admitting: Pharmacist

## 2017-01-08 ENCOUNTER — Ambulatory Visit (HOSPITAL_COMMUNITY)
Admission: RE | Admit: 2017-01-08 | Discharge: 2017-01-08 | Disposition: A | Discharge status: Home / Self Care | Payer: Medicare Other | Source: Ambulatory Visit | Attending: Cardiology | Admitting: Cardiology

## 2017-01-08 VITALS — BP 122/90 | Ht 60.0 in | Wt 175.2 lb

## 2017-01-08 DIAGNOSIS — I5022 Chronic systolic (congestive) heart failure: Secondary | ICD-10-CM | POA: Insufficient documentation

## 2017-01-08 DIAGNOSIS — Z95811 Presence of heart assist device: Secondary | ICD-10-CM | POA: Diagnosis not present

## 2017-01-08 LAB — BASIC METABOLIC PANEL
Anion gap: 9 (ref 5–15)
BUN: 12 mg/dL (ref 6–20)
CO2: 26 mmol/L (ref 22–32)
Calcium: 9.4 mg/dL (ref 8.9–10.3)
Chloride: 104 mmol/L (ref 101–111)
Creatinine, Ser: 1.1 mg/dL — ABNORMAL HIGH (ref 0.44–1.00)
GFR calc Af Amer: 53 mL/min — ABNORMAL LOW (ref >60)
GFR calc non Af Amer: 46 mL/min — ABNORMAL LOW (ref >60)
Glucose, Bld: 92 mg/dL (ref 65–99)
Potassium: 3.9 mmol/L (ref 3.5–5.1)
Sodium: 139 mmol/L (ref 135–145)

## 2017-01-08 LAB — LACTATE DEHYDROGENASE: LDH: 221 U/L — ABNORMAL HIGH (ref 98–192)

## 2017-01-08 LAB — CBC
HCT: 41 % (ref 36.0–46.0)
Hemoglobin: 13 g/dL (ref 12.0–15.0)
MCH: 30 pg (ref 26.0–34.0)
MCHC: 31.7 g/dL (ref 30.0–36.0)
MCV: 94.5 fL (ref 78.0–100.0)
Platelets: 218 10*3/uL (ref 150–400)
RBC: 4.34 MIL/uL (ref 3.87–5.11)
RDW: 16.4 % — ABNORMAL HIGH (ref 11.5–15.5)
WBC: 5.2 10*3/uL (ref 4.0–10.5)

## 2017-01-08 LAB — PROTIME-INR
INR: 1.78
Prothrombin Time: 21 seconds — ABNORMAL HIGH (ref 11.4–15.2)

## 2017-01-08 MED ORDER — HYDRALAZINE HCL 50 MG PO TABS: 100.0000 mg | ORAL_TABLET | Freq: Three times a day (TID) | ORAL | 0 refills | Status: DC

## 2017-01-08 NOTE — Patient Instructions (Signed)
Stop Losartan  Start hydralazine 100 mg tablet three times a day.   Return in 2 weeks

## 2017-01-08 NOTE — Progress Notes (Addendum)
Patient presents for 2 week  follow up in [facility] today. Reports no problems with VAD equipment or concerns with drive line. Complains of chronic cough which is worse when laying down.  Vital Signs:  Doppler Pressure 115   Automatc BP: 122/90 (109) HR:77   SPO2:98  %  Weight: 175.2 lb w/o eqt Last weight: 174.6 lb Home weights: 165-170 lbs   VAD Indication: Destination therpay   VAD interrogation & Equipment Management: Speed:8800 Flow: 3.6 Power:4.2 w    PI:7.0  Alarms: 2 low flow alarms today (patient states she was bending over to put on shoes) Events: none  Fixed speed 8800 Low speed limit: 8200  Primary Controller:  Replace back up battery in 21months. Back up controller:   Replace back up battery in 17 months.  Annual Equipment Maintenance on UBC/PM was performed on 04/2017.   I reviewed the LVAD parameters from today and compared the results to the patient's prior recorded data. LVAD interrogation was NEGATIVE for significant power changes, NEGATIVE for clinical alarms and STABLE for PI events/speed drops. No programming changes were made and pump is functioning within specified parameters. Pt is performing daily controller and system monitor self tests along with completing weekly and monthly maintenance for LVAD equipment.  LVAD equipment check completed and is in good working order. Back-up equipment present. Charged back up battery and performed self-test on equipment.   Exit Site Care: Drive line is being maintained weekly  by [employer]. Drive line exit site well healed and incorporated. The velour is fully implanted at exit site. Dressing dry and intact. No erythema or drainage. Stabilization device present and accurately applied. Pt denies fever or chills. Pt states they have adequate dressing supplies at home.   Significant Events on VAD Support:  09/21/16 GIB, Ramp echo speed decreased to 8400  12/25/16> speed increased to 8800  Device:St jude  ICD Therapies: on Last check: appt in June to check device   BP & Labs:  TDD220 - Doppler is reflecting MAP  Hgb 13 - No S/S of bleeding. Specifically denies melena/BRBPR or nosebleeds.  LDH stable at 221 with established baseline of 170- 230. Denies tea-colored urine. No power elevations noted on interrogation.   Plan: 1. Increase Hydralazine to 100 mg TID 2.Stop Losartan 3.Return to clinic in 2 weeks.  Balinda Quails RN VAD Coordinator   Office: [phone] 24/7 Emergency VAD Pager: [phone]  Agree with above. Plan discussed by phone on day of appointment.   Glori Bickers, MD  11:51 PM

## 2017-01-14 ENCOUNTER — Ambulatory Visit (HOSPITAL_COMMUNITY): Payer: Self-pay | Admitting: Pharmacist

## 2017-01-14 LAB — POCT INR: INR: 2.4

## 2017-01-21 ENCOUNTER — Ambulatory Visit (HOSPITAL_COMMUNITY): Payer: Self-pay | Admitting: Pharmacist

## 2017-01-21 DIAGNOSIS — Z7901 Long term (current) use of anticoagulants: Secondary | ICD-10-CM | POA: Diagnosis not present

## 2017-01-21 LAB — POCT INR: INR: 1.8

## 2017-01-21 NOTE — Addendum Note (Signed)
Addended by: Adora Fridge on: 01/21/2017 12:39 PM   Modules accepted: Orders

## 2017-01-22 ENCOUNTER — Encounter (HOSPITAL_COMMUNITY): Payer: Medicare Other

## 2017-01-23 ENCOUNTER — Other Ambulatory Visit (HOSPITAL_COMMUNITY): Payer: Self-pay | Admitting: *Deleted

## 2017-01-23 ENCOUNTER — Ambulatory Visit (HOSPITAL_COMMUNITY)
Admission: RE | Admit: 2017-01-23 | Discharge: 2017-01-23 | Disposition: A | Discharge status: Home / Self Care | Payer: Medicare Other | Source: Ambulatory Visit | Attending: Internal Medicine | Admitting: Internal Medicine

## 2017-01-23 ENCOUNTER — Encounter (HOSPITAL_COMMUNITY): Payer: Self-pay

## 2017-01-23 ENCOUNTER — Ambulatory Visit (HOSPITAL_COMMUNITY): Payer: Self-pay | Admitting: Pharmacist

## 2017-01-23 VITALS — BP 122/0 | HR 80 | Ht 60.0 in | Wt 181.0 lb

## 2017-01-23 DIAGNOSIS — I5022 Chronic systolic (congestive) heart failure: Secondary | ICD-10-CM | POA: Diagnosis not present

## 2017-01-23 DIAGNOSIS — Z8674 Personal history of sudden cardiac arrest: Secondary | ICD-10-CM | POA: Diagnosis not present

## 2017-01-23 DIAGNOSIS — K644 Residual hemorrhoidal skin tags: Secondary | ICD-10-CM | POA: Insufficient documentation

## 2017-01-23 DIAGNOSIS — I480 Paroxysmal atrial fibrillation: Secondary | ICD-10-CM | POA: Insufficient documentation

## 2017-01-23 DIAGNOSIS — I5043 Acute on chronic combined systolic (congestive) and diastolic (congestive) heart failure: Secondary | ICD-10-CM

## 2017-01-23 DIAGNOSIS — Z79899 Other long term (current) drug therapy: Secondary | ICD-10-CM | POA: Insufficient documentation

## 2017-01-23 DIAGNOSIS — Z86010 Personal history of colonic polyps: Secondary | ICD-10-CM | POA: Insufficient documentation

## 2017-01-23 DIAGNOSIS — Z7901 Long term (current) use of anticoagulants: Secondary | ICD-10-CM

## 2017-01-23 DIAGNOSIS — I429 Cardiomyopathy, unspecified: Secondary | ICD-10-CM | POA: Diagnosis not present

## 2017-01-23 DIAGNOSIS — I10 Essential (primary) hypertension: Secondary | ICD-10-CM | POA: Diagnosis not present

## 2017-01-23 DIAGNOSIS — Z95811 Presence of heart assist device: Secondary | ICD-10-CM | POA: Diagnosis not present

## 2017-01-23 DIAGNOSIS — I110 Hypertensive heart disease with heart failure: Secondary | ICD-10-CM | POA: Insufficient documentation

## 2017-01-23 DIAGNOSIS — M8580 Other specified disorders of bone density and structure, unspecified site: Secondary | ICD-10-CM | POA: Diagnosis not present

## 2017-01-23 DIAGNOSIS — Z95810 Presence of automatic (implantable) cardiac defibrillator: Secondary | ICD-10-CM | POA: Diagnosis not present

## 2017-01-23 DIAGNOSIS — R42 Dizziness and giddiness: Secondary | ICD-10-CM | POA: Insufficient documentation

## 2017-01-23 LAB — COMPREHENSIVE METABOLIC PANEL
ALT: 22 U/L (ref 14–54)
AST: 25 U/L (ref 15–41)
Albumin: 3.4 g/dL — ABNORMAL LOW (ref 3.5–5.0)
Alkaline Phosphatase: 88 U/L (ref 38–126)
Anion gap: 7 (ref 5–15)
BUN: 13 mg/dL (ref 6–20)
CO2: 20 mmol/L — ABNORMAL LOW (ref 22–32)
Calcium: 8.8 mg/dL — ABNORMAL LOW (ref 8.9–10.3)
Chloride: 110 mmol/L (ref 101–111)
Creatinine, Ser: 1.05 mg/dL — ABNORMAL HIGH (ref 0.44–1.00)
GFR calc Af Amer: 57 mL/min — ABNORMAL LOW (ref >60)
GFR calc non Af Amer: 49 mL/min — ABNORMAL LOW (ref >60)
Glucose, Bld: 104 mg/dL — ABNORMAL HIGH (ref 65–99)
Potassium: 3.9 mmol/L (ref 3.5–5.1)
Sodium: 137 mmol/L (ref 135–145)
Total Bilirubin: 0.5 mg/dL (ref 0.3–1.2)
Total Protein: 7.3 g/dL (ref 6.5–8.1)

## 2017-01-23 LAB — CBC
HCT: 38.5 % (ref 36.0–46.0)
Hemoglobin: 12.2 g/dL (ref 12.0–15.0)
MCH: 30.2 pg (ref 26.0–34.0)
MCHC: 31.7 g/dL (ref 30.0–36.0)
MCV: 95.3 fL (ref 78.0–100.0)
Platelets: 186 10*3/uL (ref 150–400)
RBC: 4.04 MIL/uL (ref 3.87–5.11)
RDW: 16.3 % — ABNORMAL HIGH (ref 11.5–15.5)
WBC: 5.4 10*3/uL (ref 4.0–10.5)

## 2017-01-23 LAB — PROTIME-INR
INR: 2.24
Prothrombin Time: 25.2 seconds — ABNORMAL HIGH (ref 11.4–15.2)

## 2017-01-23 LAB — LACTATE DEHYDROGENASE: LDH: 211 U/L — ABNORMAL HIGH (ref 98–192)

## 2017-01-23 MED ORDER — LOSARTAN POTASSIUM 50 MG PO TABS: 50.0000 mg | ORAL_TABLET | Freq: Every day | ORAL | 3 refills | Status: DC

## 2017-01-23 MED ORDER — FUROSEMIDE 20 MG PO TABS
20.0000 mg | ORAL_TABLET | Freq: Every day | ORAL | 6 refills | Status: DC | PRN
Start: 2017-01-23 — End: 2019-06-15

## 2017-01-23 NOTE — Progress Notes (Signed)
Patient presents for 2 week follow up in [facility] today. Reports no problems with VAD equipment or concerns with drive line. Reports she is feeling better today; has taken to PRN doses of Lasix since last visit. VAD speed increased from 8400 to 8800 RPM last visit. Pt had Hydralazine increased from 75 to 100 mg tid and stopped Losartan last visit. Doppler and Auto cuff BPs are not correlating today (see below)   Vital Signs:  Doppler Pressure 140; repeat 144  Automatc BP: 117/78 (84); repeat 124/84 (99) HR: 80 SPO2: 96 %  Weight: 181  lb w/o eqt Last weight: 175.2 lb  VAD Indication: Destination Therapy    VAD interrogation & Equipment Management: Speed: 8800 Flow: 3.7 Power:  4.3 w    PI: 7.2  Alarms: 01/08/17 - 46 Low Flow; 5/10 - 23 Low Flow; 01/14/17 - 2 Low Flow alarms Events: none  Fixed speed 8800  Low speed limit: 8200  Primary Controller:  Replace back up battery in 17 months. Back up controller:   Replace back up battery in 17 months.  Annual Equipment Maintenance on UBC/PM was performed on 04/2017.   I reviewed the LVAD parameters from today and compared the results to the patient's prior recorded data. LVAD interrogation was NEGATIVE for significant power changes, POSITIVE for clinical alarms and STABLE for PI events/speed drops. No programming changes were made and pump is functioning within specified parameters. Pt is performing daily controller and system monitor self tests along with completing weekly and monthly maintenance for LVAD equipment.  LVAD equipment check completed and is in good working order. Back-up equipment present. Charged back up battery and performed self-test on equipment.   Exit Site Care: Drive line is being maintained weekly  by [employer]. Drive line exit site well healed and incorporated. The velour is fully implanted at exit site. Dressing dry and intact. No erythema or drainage. Stabilization device present and accurately applied. Pt  denies fever or chills. Pt states they have adequate dressing supplies at home.   Significant Events on VAD Support:  09/21/16 GIB, Ramp echo speed decreased to 8400  01/08/17 - LOW FLOW alarms; speed increased to 8800 RPM  Device:St Jude ICD Therapies: on at 200 bpm Last check: appt this June for device check   BP & Labs: MAP 122 - Doppler is reflecting modified systolic  Hgb 28.0 - No S/S of bleeding. Specifically denies melena/BRBPR or nosebleeds.  LDH stable at 211 with established baseline of 170- 230. Denies tea-colored urine. No power elevations noted on interrogation.   Plan: 1. Re-start Losartan 50 mg daily 2. Continue taking Lasix 20 mg as needed; take a dose today 3. Return in two weeks for BP check  Zada Girt RN [place] Coordinator   Office: [phone] 24/7 Emergency VAD Pager: [phone]

## 2017-01-23 NOTE — Progress Notes (Signed)
ADVANCED HF CLINIC NOTE  Patient ID: Brianna Hanson, female   DOB: Jul 29, 1935, 82 y.o.   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 Underwent implantation of the HeartMate II LVAD on 04/06/13 for DT. She is not on aspirin due to dizziness. S/P lap cholecystitis 10/2015.   GI Issues 09/2016 LGIB. Angio- no source of bleeding. LVAD speed 131 976turned down to 8400  Today she returns for LVAD follow up. Last visit speed was increased to 8800. Overall feeling much better. Able to walk around more. Denies SOB/PND/Orthopnea. Has had some LE edema recently.  She has had a couple doses of lasix over the last few weeks. Weight at home 170-171 pounds. Taking all medications. Denies BRBPR. Denies dizziness. Occasional low flows on VAD.[name] No fevers or chills. Driveline site ok.   VAD Indication:DT    VAD interrogation & Equipment Management: Speed: 8800 Flow: 3.7 Power: 4.3 PI 7.2    Alarms: no clinical alarms Events: Low Flow  5/9   46 Low flow , 5/10 23 Low Flow, 5/15 2 low flow Fixed speed 8800 Low speed limit: 8200  Primary Controller: Replace in 33months. Back up controller: Replace back up battery in 78months.  Past Medical History:  Diagnosis Date  . AICD (automatic cardioverter/defibrillator) present   . Anemia   . Atrial fibrillation or flutter   . Cardiac arrest - ventricular fibrillation 12/10   with successful resucitation, S/p ICD  . CHF (congestive heart failure) ([place])   . Diverticula of colon 2011  . HTN (hypertension)    moderate  . ICD (implantable cardiac defibrillator) in place    she has received appropriate therapy for VF  . Internal and external hemorrhoids without complication 8366  . Nonischemic cardiomyopathy ([place])    followed  by Brianna Hanson at [facility]  . Osteopenia   . Plasma cell disorder 03/20/2012  . Plasma cell disorder 03/20/2012  . Sessile colonic polyp 2011   Brianna Hanson    Current Outpatient Prescriptions  Medication Sig Dispense Refill  . amiodarone (PACERONE) 200 MG tablet TAKE 1 TABLET BY MOUTH EVERY DAY (Patient taking differently: pt taking 1/2 tab; 100 mg daily) 30 tablet 2  . carvedilol (COREG) 6.25 MG tablet TAKE 1 TABLET (6.25 MG TOTAL) BY MOUTH 2 (TWO) TIMES DAILY WITH A MEAL. 60 tablet 11  . Cholecalciferol (VITAMIN D3) 1000 UNITS CAPS Take 1 tablet by mouth daily.      [name] docusate sodium (COLACE) 100 MG capsule Take 1 capsule (100 mg total) by mouth 2 (two) times daily. (Patient taking differently: Take 100 mg by mouth daily. ) 60 capsule 6  . ferrous sulfate 325 (65 FE) MG tablet Take 1 tablet (325 mg total) by mouth 2 (two) times daily with a meal. 60 tablet 6  . furosemide (LASIX) 20 MG tablet Take 1 tablet (20 mg total) by mouth daily as needed (Only when recommended by CHF clinic.). 30 tablet 6  . gabapentin (NEURONTIN) 100 MG capsule Take 1 capsule (100 mg total) by mouth at bedtime. 30 capsule 5  . hydrALAZINE (APRESOLINE) 50 MG tablet Take 2 tablets (100 mg total) by mouth 3 (three) times daily. 270 tablet 0  . pantoprazole (PROTONIX) 40 MG tablet  TAKE 1 TABLET BY MOUTH EVERY DAY 30 tablet 6  . potassium chloride SA (KLOR-CON M20) 20 MEQ tablet Take 1 tablet (20 mEq total) by mouth 2 (two) times daily. 60 tablet 11  . traMADol (ULTRAM) 50 MG tablet Take 2 tablets (100 mg total) by mouth every 12 (twelve) hours as needed for moderate pain. 60 tablet 3  . warfarin (COUMADIN) 2.5 MG tablet Take 2.5-5 mg by mouth daily. Take 5 mg (2 tabs) daily except 2.5 mg (1 tab) on Thurs and Sat.    [name] losartan (COZAAR) 50 MG tablet Take 1 tablet (50 mg total) by mouth daily. 90 tablet 3   No current facility-administered medications for this encounter.     Patient has no known allergies.  REVIEW OF SYSTEMS:  All systems negative except as listed in HPI, PMH and Problem list.  Vitals:   01/23/17 1011 01/23/17 1021  BP: 117/78 (!) 122/0  Pulse: 80    Doppler 122 Automatic BP: 124/84 (99)   HR: 80 Os at 96 Weight 181 pounds Last Weight 175.2.   Physical Exam: GENERAL: Well appearing, woman who presents to clinic today in no acute distress. HEENT: normal  NECK: Supple, JVP 6-7  2+ bilaterally, no bruits.  No lymphadenopathy or thyromegaly appreciated.   CARDIAC:  Mechanical heart sounds with LVAD hum present.  LUNGS:  Clear to auscultation bilaterally.  ABDOMEN:  Soft, round, nontender, positive bowel sounds x4.     LVAD exit site: well-healed and incorporated.  Dressing dry and intact.  No erythema or drainage.  Stabilization device present and accurately applied.  Driveline dressing is being changed daily per sterile technique. EXTREMITIES:  Warm and dry, no cyanosis, clubbing, rash or [name] trace-1+ edema  NEUROLOGIC:  Alert and oriented x 4.  Gait steady.  No aphasia.  No dysarthria.  Affect pleasant.     ASSESSMENT AND PLAN:  1) Chronic systolic HF: NICM, s/p ICD and LVAD for DT(04/2013).  Functional improvement. NYHA II-III  Volume status mildly elevated. Continue lasix as needed.  Add back losartan for HTN . Continue lasix as needed.  2) LVAD placed for DT 04/2013:  Functional improvement with increased speed. No asa with GI bleed and intolerance.  Continue coumadin. No bleeding problems.  3) PAF: Regular pulse.  Continue amio 100 mg daily. Follow TSH and LFTs.  Needs year eye exams.    4) HTN: Elevated. Continue current dose of hydralazine and carvedilol. Add back losartan 50 mg daily.  5) Chronic Anticoagulation: Goal INR 1.8-2.3. INR 2.24. Continue coumadin.   Reviewed lab work. BMET,CBC, LDH, INR stable.  Follow up in 2 weeks to check BP and check BMET at that time.     Amy Clegg NP-C  01/23/2017   Patient seen and examined with Darrick Grinder, NP. We discussed all  aspects of the encounter. I agree with the assessment and plan as stated above.   Overall feeling much better with VAD speed increased. Occasional low flows but VAD parameters o/w stable. MAPs are up. Will add back losartan. Volume up slightly. Continue prn lasix.   Glori Bickers, MD  9:14 PM

## 2017-01-23 NOTE — Patient Instructions (Signed)
1. Re-start Losartan 50 mg daily 2. Continue taking Lasix 20 mg as needed; take a dose today 3. Return in two weeks for BP check

## 2017-01-28 ENCOUNTER — Ambulatory Visit (HOSPITAL_COMMUNITY): Payer: Self-pay | Admitting: Pharmacist

## 2017-01-28 LAB — POCT INR: INR: 2.4

## 2017-02-03 ENCOUNTER — Ambulatory Visit (INDEPENDENT_AMBULATORY_CARE_PROVIDER_SITE_OTHER): Payer: Medicare Other | Admitting: Internal Medicine

## 2017-02-03 ENCOUNTER — Encounter: Payer: Self-pay | Admitting: Internal Medicine

## 2017-02-03 VITALS — BP 110/0 | HR 78 | Ht 60.0 in | Wt 180.0 lb

## 2017-02-03 DIAGNOSIS — I4901 Ventricular fibrillation: Secondary | ICD-10-CM

## 2017-02-03 DIAGNOSIS — I5022 Chronic systolic (congestive) heart failure: Secondary | ICD-10-CM | POA: Diagnosis not present

## 2017-02-03 DIAGNOSIS — Z959 Presence of cardiac and vascular implant and graft, unspecified: Secondary | ICD-10-CM | POA: Diagnosis not present

## 2017-02-03 DIAGNOSIS — Z95811 Presence of heart assist device: Secondary | ICD-10-CM

## 2017-02-03 NOTE — Progress Notes (Signed)
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 female who presents today for routine electrophysiology followup.  Since last being seen in our clinic, the patient reports doing very well.  She remains active. Today, she denies symptoms of palpitations, chest pain, shortness of breath,  lower extremity edema, dizziness, presyncope, syncope, or ICD shocks.  The patient is otherwise without complaint today.   Past Medical History:  Diagnosis Date  . AICD (automatic cardioverter/defibrillator) present   . Anemia   . Atrial fibrillation or flutter   . Cardiac arrest - ventricular fibrillation 12/10   with successful resucitation, S/p ICD  . CHF (congestive heart failure) ([place])   . Diverticula of colon 2011  . HTN (hypertension)    moderate  . ICD (implantable cardiac defibrillator) in place    she has received appropriate therapy for VF  . Internal and external hemorrhoids without complication 7867  . Nonischemic cardiomyopathy ([place])    followed by Dr April Holding at [facility]  . Osteopenia   . Plasma cell disorder 03/20/2012  . Plasma cell disorder 03/20/2012  . Sessile colonic polyp 2011   Dr Benson Norway   Past Surgical History:  Procedure Laterality Date  . ABDOMINAL HYSTERECTOMY    . CARDIAC CATHETERIZATION    . CARDIAC DEFIBRILLATOR PLACEMENT     by Greggory Brandy for secondary prevention of sudden death  . CHOLECYSTECTOMY N/A 15-Oct-2015   Procedure: LAPAROSCOPIC CHOLECYSTECTOMY;  Surgeon: Rolm Bookbinder, MD;  Location: [facility];  Service: General;  Laterality: N/A;  . COLONOSCOPY W/ POLYPECTOMY  2011   Dr Benson Norway  . INSERTION OF IMPLANTABLE LEFT VENTRICULAR ASSIST DEVICE N/A 04/06/2013   Procedure: INSERTION OF IMPLANTABLE LEFT VENTRICULAR ASSIST DEVICE;  Surgeon: Gaye Pollack, MD;  Location: [facility];  Service: Open Heart Surgery;  Laterality: N/A;  . INTRAOPERATIVE TRANSESOPHAGEAL ECHOCARDIOGRAM N/A 04/06/2013   Procedure: INTRAOPERATIVE TRANSESOPHAGEAL  ECHOCARDIOGRAM;  Surgeon: Gaye Pollack, MD;  Location: [facility];  Service: Open Heart Surgery;  Laterality: N/A;  . IR GENERIC HISTORICAL  09/24/2016   IR US GUIDE VASC ACCESS RIGHT 09/24/2016 Greggory Keen, MD [facility]  . IR GENERIC HISTORICAL  09/24/2016   IR ANGIOGRAM VISCERAL SELECTIVE 09/24/2016 Greggory Keen, MD [facility]  . IR GENERIC HISTORICAL  09/24/2016   IR ANGIOGRAM VISCERAL SELECTIVE 09/24/2016 Greggory Keen, MD [facility]  . PLACEMENT OF CENTRIMAG VENTRICULAR ASSIST DEVICE Right 04/06/2013   Procedure: PLACEMENT OF CENTRIMAG VENTRICULAR ASSIST DEVICE;  Surgeon: Gaye Pollack, MD;  Location: [facility];  Service: Open Heart Surgery;  Laterality: Right;    ROS- all systems are reviewed and negative except as per HPI above  Current Outpatient Prescriptions  Medication Sig Dispense Refill  . amiodarone (PACERONE) 200 MG tablet Take 100 mg by mouth daily.    . carvedilol (COREG) 6.25 MG tablet TAKE 1 TABLET (6.25 MG TOTAL) BY MOUTH 2 (TWO) TIMES DAILY WITH A MEAL. 60 tablet 11  . Cholecalciferol (VITAMIN D3) 1000 UNITS CAPS Take 1 tablet by mouth daily.      [name] docusate sodium (COLACE) 100 MG capsule Take 100 mg by mouth daily.    . ferrous sulfate 325 (65 FE) MG tablet Take 1 tablet (325 mg total) by mouth 2 (two) times daily with a meal. 60 tablet 6  . furosemide (LASIX) 20 MG tablet Take 1 tablet (20 mg total) by mouth daily as needed (Only when recommended by CHF clinic.). 30 tablet 6  . gabapentin (  NEURONTIN) 100 MG capsule Take 1 capsule (100 mg total) by mouth at bedtime. 30 capsule 5  . hydrALAZINE (APRESOLINE) 50 MG tablet Take 2 tablets (100 mg total) by mouth 3 (three) times daily. 270 tablet 0  . losartan (COZAAR) 50 MG tablet Take 1 tablet (50 mg total) by mouth daily. 90 tablet 3  . pantoprazole (PROTONIX) 40 MG tablet TAKE 1 TABLET BY MOUTH EVERY DAY 30 tablet 6  . potassium chloride SA (KLOR-CON M20) 20 MEQ tablet Take 1 tablet (20 mEq total) by mouth 2 (two) times  daily. 60 tablet 11  . traMADol (ULTRAM) 50 MG tablet Take 2 tablets (100 mg total) by mouth every 12 (twelve) hours as needed for moderate pain. 60 tablet 3  . warfarin (COUMADIN) 2.5 MG tablet Take 2.5-5 mg by mouth daily. Take 5 mg (2 tabs) daily except 2.5 mg (1 tab) on Thurs and Sat.     No current facility-administered medications for this visit.     Physical Exam: Vitals:   02/03/17 1222  BP: (!) 110/0  Pulse: 78  SpO2: 95%  Weight: 180 lb (81.6 kg)  Height: 5' (1.524 m)    GEN- The patient is very pleasant appearing, alert and oriented x 3 today.  overweight Head- normocephalic, atraumatic Eyes-  Sclera clear, conjunctiva pink Ears- hearing intact Oropharynx- clear Lungs- Clear to ausculation bilaterally, normal work of breathing Chest- ICD pocket is well healed Heart- Regular rate and rhythm, VAD hum GI- soft, NT, ND, + BS Extremities- no clubbing, cyanosis, or edema  ICD interrogation- reviewed in detail today,  See PACEART report ekg today reveals sinus rhythm with demand A pacing, PVCs, anterolateral infarct pattern  Assessment and Plan:  1.  Chronic systolic dysfunction euvolemic today Stable on an appropriate medical regimen Normal ICD function See Pace Art report No changes today I have discussed SJM Fortify Assura advisary with the patient today. She understands that recommendation from SJM is to not replace the device at this time. The patient is not device dependant.  The patient has had appropriate device therapy in the past or implanted for secondary prevention.  Vibratory alert demonstrated today.  She is actively remotely monitored and understands the importance of compliance today.   2. Prior VF As above Doing well with amiodarone  3. Afib Well controlled on amiodarone 100mg  daily Lfts/ tfts followed by CHF clinic  [place]  I will see again in the device clinic in 1 year  Thompson Grayer MD, [facility] 02/03/2017 1:04 PM

## 2017-02-03 NOTE — Patient Instructions (Addendum)
Medication Instructions:  Your physician recommends that you continue on your current medications as directed. Please refer to the Current Medication list given to you today.   Labwork: None ordered   Testing/Procedures: None ordered   Follow-Up: Remote monitoring is used to monitor your  ICD from home. This monitoring reduces the number of office visits required to check your device to one time per year. It allows [place] to keep an eye on the functioning of your device to ensure it is working properly. You are scheduled for a device check from home on 05/05/17. You may send your transmission at any time that day. If you have a wireless device, the transmission will be sent automatically. After your physician reviews your transmission, you will receive a postcard with your next transmission date.  Your physician wants you to follow-up in: 12 months with Dr Vallery Ridge will receive a reminder letter in the mail two months in advance. If you don't receive a letter, please call our office to schedule the follow-up appointment.     Any Other Special Instructions Will Be Listed Below (If Applicable).     If you need a refill on your cardiac medications before your next appointment, please call your pharmacy.

## 2017-02-04 ENCOUNTER — Ambulatory Visit (HOSPITAL_COMMUNITY): Payer: Self-pay | Admitting: Pharmacist

## 2017-02-04 LAB — POCT INR: INR: 2.5

## 2017-02-05 ENCOUNTER — Other Ambulatory Visit (HOSPITAL_COMMUNITY): Payer: Self-pay | Admitting: *Deleted

## 2017-02-05 DIAGNOSIS — Z79899 Other long term (current) drug therapy: Secondary | ICD-10-CM

## 2017-02-05 DIAGNOSIS — I5043 Acute on chronic combined systolic (congestive) and diastolic (congestive) heart failure: Secondary | ICD-10-CM

## 2017-02-06 ENCOUNTER — Ambulatory Visit (HOSPITAL_COMMUNITY)
Admission: RE | Admit: 2017-02-06 | Discharge: 2017-02-06 | Disposition: A | Discharge status: Home / Self Care | Payer: Medicare Other | Source: Ambulatory Visit | Attending: Cardiology | Admitting: Cardiology

## 2017-02-06 VITALS — BP 140/0 | HR 70

## 2017-02-06 DIAGNOSIS — Z95811 Presence of heart assist device: Secondary | ICD-10-CM

## 2017-02-06 DIAGNOSIS — I5022 Chronic systolic (congestive) heart failure: Secondary | ICD-10-CM

## 2017-02-06 DIAGNOSIS — I5043 Acute on chronic combined systolic (congestive) and diastolic (congestive) heart failure: Secondary | ICD-10-CM

## 2017-02-06 DIAGNOSIS — Z79899 Other long term (current) drug therapy: Secondary | ICD-10-CM

## 2017-02-06 DIAGNOSIS — I10 Essential (primary) hypertension: Secondary | ICD-10-CM

## 2017-02-06 LAB — CUP PACEART INCLINIC DEVICE CHECK
Battery Remaining Longevity: 34 mo
Brady Statistic RA Percent Paced: 3.4 %
Brady Statistic RV Percent Paced: 0.13 %
Date Time Interrogation Session: 20180604164407
HighPow Impedance: 43.2955
Implantable Lead Implant Date: 20110104
Implantable Lead Implant Date: 20110104
Implantable Lead Location: 753859
Implantable Lead Location: 753860
Implantable Lead Model: 7121
Implantable Pulse Generator Implant Date: 20110104
Lead Channel Impedance Value: 300 Ohm
Lead Channel Impedance Value: 300 Ohm
Lead Channel Pacing Threshold Amplitude: 0.75 V
Lead Channel Pacing Threshold Amplitude: 0.75 V
Lead Channel Pacing Threshold Pulse Width: 0.5 ms
Lead Channel Pacing Threshold Pulse Width: 0.5 ms
Lead Channel Sensing Intrinsic Amplitude: 0.6 mV
Lead Channel Sensing Intrinsic Amplitude: 9.3 mV
Lead Channel Setting Pacing Amplitude: 2 V
Lead Channel Setting Pacing Amplitude: 2.5 V
Lead Channel Setting Pacing Pulse Width: 0.5 ms
Lead Channel Setting Sensing Sensitivity: 0.5 mV
Pulse Gen Serial Number: 754815

## 2017-02-06 LAB — BASIC METABOLIC PANEL
Anion gap: 5 (ref 5–15)
BUN: 10 mg/dL (ref 6–20)
CO2: 23 mmol/L (ref 22–32)
Calcium: 8.6 mg/dL — ABNORMAL LOW (ref 8.9–10.3)
Chloride: 108 mmol/L (ref 101–111)
Creatinine, Ser: 1.1 mg/dL — ABNORMAL HIGH (ref 0.44–1.00)
GFR calc Af Amer: 53 mL/min — ABNORMAL LOW (ref >60)
GFR calc non Af Amer: 46 mL/min — ABNORMAL LOW (ref >60)
Glucose, Bld: 88 mg/dL (ref 65–99)
Potassium: 3.9 mmol/L (ref 3.5–5.1)
Sodium: 136 mmol/L (ref 135–145)

## 2017-02-06 MED ORDER — LOSARTAN POTASSIUM 50 MG PO TABS: 100.0000 mg | ORAL_TABLET | Freq: Every day | ORAL | 3 refills | Status: DC

## 2017-02-06 NOTE — Patient Instructions (Addendum)
1.  Increase Losartan to 100 mg daily. 2. Return for BP check in two weeks with BMET.

## 2017-02-06 NOTE — Addendum Note (Signed)
Encounter addended by: Lezlie Octave, RN on: 02/06/2017 10:39 AM<BR>    Actions taken: Order Reconciliation Section accessed, Home Medications modified, Sign clinical note

## 2017-02-10 ENCOUNTER — Ambulatory Visit (HOSPITAL_COMMUNITY): Payer: Self-pay | Admitting: Pharmacist

## 2017-02-10 LAB — POCT INR: INR: 2.1

## 2017-02-18 ENCOUNTER — Ambulatory Visit (HOSPITAL_COMMUNITY): Payer: Self-pay | Admitting: Pharmacist

## 2017-02-18 DIAGNOSIS — Z7901 Long term (current) use of anticoagulants: Secondary | ICD-10-CM | POA: Diagnosis not present

## 2017-02-18 LAB — POCT INR: INR: 2.3

## 2017-02-20 ENCOUNTER — Other Ambulatory Visit (HOSPITAL_COMMUNITY): Payer: Self-pay | Admitting: *Deleted

## 2017-02-20 ENCOUNTER — Encounter (HOSPITAL_COMMUNITY): Payer: Self-pay

## 2017-02-20 ENCOUNTER — Ambulatory Visit (HOSPITAL_COMMUNITY)
Admission: RE | Admit: 2017-02-20 | Discharge: 2017-02-20 | Disposition: A | Discharge status: Home / Self Care | Payer: Medicare Other | Source: Ambulatory Visit | Attending: Internal Medicine | Admitting: Internal Medicine

## 2017-02-20 VITALS — BP 140/0 | HR 82 | Ht 60.0 in | Wt 177.8 lb

## 2017-02-20 DIAGNOSIS — I5043 Acute on chronic combined systolic (congestive) and diastolic (congestive) heart failure: Secondary | ICD-10-CM

## 2017-02-20 DIAGNOSIS — Z79899 Other long term (current) drug therapy: Secondary | ICD-10-CM

## 2017-02-20 DIAGNOSIS — Z95811 Presence of heart assist device: Secondary | ICD-10-CM | POA: Diagnosis not present

## 2017-02-20 DIAGNOSIS — I10 Essential (primary) hypertension: Secondary | ICD-10-CM

## 2017-02-20 LAB — BASIC METABOLIC PANEL
Anion gap: 5 (ref 5–15)
BUN: 6 mg/dL (ref 6–20)
CO2: 26 mmol/L (ref 22–32)
Calcium: 9.1 mg/dL (ref 8.9–10.3)
Chloride: 109 mmol/L (ref 101–111)
Creatinine, Ser: 0.98 mg/dL (ref 0.44–1.00)
GFR calc Af Amer: >60 mL/min (ref >60)
GFR calc non Af Amer: 53 mL/min — ABNORMAL LOW (ref >60)
Glucose, Bld: 95 mg/dL (ref 65–99)
Potassium: 3.9 mmol/L (ref 3.5–5.1)
Sodium: 140 mmol/L (ref 135–145)

## 2017-02-20 MED ORDER — AMLODIPINE BESYLATE 2.5 MG PO TABS: ORAL_TABLET | ORAL | 6 refills | Status: DC

## 2017-02-20 NOTE — Patient Instructions (Signed)
1.  Start Amlodipine 2.5 mg every evening. 2.  BP check next clinic visit.

## 2017-02-20 NOTE — Addendum Note (Signed)
Encounter addended by: Lezlie Octave, RN on: 02/20/2017  9:39 AM<BR>    Actions taken: Vitals modified, Sign clinical note

## 2017-02-20 NOTE — Progress Notes (Signed)
Pt reported to VAD clinic for nurse visit for BP check and BMET. Losartan increased from 50 to 100 mg daily two weeks ago.   Doppler BP: 140 - modified systolic Auto cuff:  373/42 (114)  HR:  82 O2 sat: 95%  Above reviewed with Darrick Grinder, NP - will start Amlodipine 2.5 mg daily - instructed caregiver to give at night to decrease chance of side effects. Will re-check BP at next clinic visit. [name] verbalized understanding of same.

## 2017-02-20 NOTE — Progress Notes (Signed)
Pt presented to clinic for nurse visit to check BP after re-starting Losartan 50 mg daily. BP remains elevated; reviewed with Darrick Grinder, NP with following orders noted.  Patient Instructions: 1.  Increase Losartan to 100 mg daily. 2.  Return for nurse visit in 2 weeks for BP check and BMET.

## 2017-02-21 ENCOUNTER — Other Ambulatory Visit: Payer: Self-pay | Admitting: Internal Medicine

## 2017-02-25 LAB — POCT INR: INR: 1.9

## 2017-02-26 ENCOUNTER — Ambulatory Visit (HOSPITAL_COMMUNITY): Payer: Self-pay | Admitting: Pharmacist

## 2017-03-04 ENCOUNTER — Ambulatory Visit (HOSPITAL_COMMUNITY): Payer: Self-pay | Admitting: Pharmacist

## 2017-03-04 LAB — POCT INR: INR: 2.4

## 2017-03-11 ENCOUNTER — Ambulatory Visit (HOSPITAL_COMMUNITY): Payer: Self-pay | Admitting: Pharmacist

## 2017-03-11 LAB — POCT INR: INR: 2.2

## 2017-03-18 ENCOUNTER — Ambulatory Visit (HOSPITAL_COMMUNITY): Payer: Self-pay | Admitting: Pharmacist

## 2017-03-18 DIAGNOSIS — Z7901 Long term (current) use of anticoagulants: Secondary | ICD-10-CM | POA: Diagnosis not present

## 2017-03-18 LAB — POCT INR: INR: 1.9

## 2017-03-25 ENCOUNTER — Ambulatory Visit (HOSPITAL_COMMUNITY): Payer: Self-pay | Admitting: *Deleted

## 2017-03-25 LAB — POCT INR: INR: 3.4

## 2017-03-28 ENCOUNTER — Other Ambulatory Visit: Payer: Self-pay | Admitting: Internal Medicine

## 2017-04-01 ENCOUNTER — Ambulatory Visit (HOSPITAL_COMMUNITY): Payer: Self-pay | Admitting: Pharmacist

## 2017-04-01 LAB — POCT INR: INR: 1.8

## 2017-04-08 ENCOUNTER — Ambulatory Visit (HOSPITAL_COMMUNITY): Payer: Self-pay | Admitting: Pharmacist

## 2017-04-08 ENCOUNTER — Other Ambulatory Visit (HOSPITAL_COMMUNITY): Payer: Self-pay | Admitting: Unknown Physician Specialty

## 2017-04-08 ENCOUNTER — Ambulatory Visit (HOSPITAL_COMMUNITY)
Admission: RE | Admit: 2017-04-08 | Discharge: 2017-04-08 | Disposition: A | Discharge status: Home / Self Care | Payer: Medicare Other | Source: Ambulatory Visit | Attending: Cardiology | Admitting: Cardiology

## 2017-04-08 VITALS — BP 124/89 | HR 74 | Ht 60.0 in | Wt 176.0 lb

## 2017-04-08 DIAGNOSIS — Z86010 Personal history of colonic polyps: Secondary | ICD-10-CM | POA: Diagnosis not present

## 2017-04-08 DIAGNOSIS — I5022 Chronic systolic (congestive) heart failure: Secondary | ICD-10-CM | POA: Insufficient documentation

## 2017-04-08 DIAGNOSIS — I10 Essential (primary) hypertension: Secondary | ICD-10-CM | POA: Diagnosis not present

## 2017-04-08 DIAGNOSIS — Z95811 Presence of heart assist device: Secondary | ICD-10-CM

## 2017-04-08 DIAGNOSIS — Z8674 Personal history of sudden cardiac arrest: Secondary | ICD-10-CM | POA: Insufficient documentation

## 2017-04-08 DIAGNOSIS — Z7901 Long term (current) use of anticoagulants: Secondary | ICD-10-CM

## 2017-04-08 DIAGNOSIS — Z95810 Presence of automatic (implantable) cardiac defibrillator: Secondary | ICD-10-CM | POA: Diagnosis not present

## 2017-04-08 DIAGNOSIS — I110 Hypertensive heart disease with heart failure: Secondary | ICD-10-CM | POA: Diagnosis not present

## 2017-04-08 DIAGNOSIS — M8580 Other specified disorders of bone density and structure, unspecified site: Secondary | ICD-10-CM | POA: Diagnosis not present

## 2017-04-08 DIAGNOSIS — I429 Cardiomyopathy, unspecified: Secondary | ICD-10-CM | POA: Diagnosis not present

## 2017-04-08 DIAGNOSIS — I480 Paroxysmal atrial fibrillation: Secondary | ICD-10-CM | POA: Insufficient documentation

## 2017-04-08 DIAGNOSIS — R42 Dizziness and giddiness: Secondary | ICD-10-CM | POA: Diagnosis not present

## 2017-04-08 DIAGNOSIS — Z79899 Other long term (current) drug therapy: Secondary | ICD-10-CM | POA: Diagnosis not present

## 2017-04-08 LAB — PROTIME-INR
INR: 2.6
Prothrombin Time: 28.3 seconds — ABNORMAL HIGH (ref 11.4–15.2)

## 2017-04-08 LAB — COMPREHENSIVE METABOLIC PANEL
ALT: 26 U/L (ref 14–54)
AST: 27 U/L (ref 15–41)
Albumin: 3.2 g/dL — ABNORMAL LOW (ref 3.5–5.0)
Alkaline Phosphatase: 85 U/L (ref 38–126)
Anion gap: 8 (ref 5–15)
BUN: 10 mg/dL (ref 6–20)
CO2: 21 mmol/L — ABNORMAL LOW (ref 22–32)
Calcium: 9.1 mg/dL (ref 8.9–10.3)
Chloride: 109 mmol/L (ref 101–111)
Creatinine, Ser: 0.87 mg/dL (ref 0.44–1.00)
GFR calc Af Amer: >60 mL/min (ref >60)
GFR calc non Af Amer: >60 mL/min (ref >60)
Glucose, Bld: 89 mg/dL (ref 65–99)
Potassium: 4.1 mmol/L (ref 3.5–5.1)
Sodium: 138 mmol/L (ref 135–145)
Total Bilirubin: 0.8 mg/dL (ref 0.3–1.2)
Total Protein: 8.1 g/dL (ref 6.5–8.1)

## 2017-04-08 LAB — CBC
HCT: 37.8 % (ref 36.0–46.0)
Hemoglobin: 12.4 g/dL (ref 12.0–15.0)
MCH: 29.8 pg (ref 26.0–34.0)
MCHC: 32.8 g/dL (ref 30.0–36.0)
MCV: 90.9 fL (ref 78.0–100.0)
Platelets: 183 10*3/uL (ref 150–400)
RBC: 4.16 MIL/uL (ref 3.87–5.11)
RDW: 15.4 % (ref 11.5–15.5)
WBC: 5 10*3/uL (ref 4.0–10.5)

## 2017-04-08 LAB — PREALBUMIN: Prealbumin: 15.9 mg/dL — ABNORMAL LOW (ref 18–38)

## 2017-04-08 LAB — LACTATE DEHYDROGENASE: LDH: 248 U/L — ABNORMAL HIGH (ref 98–192)

## 2017-04-08 NOTE — Progress Notes (Signed)
ADVANCED HF CLINIC NOTE  Patient ID: Brianna Hanson, female   DOB: 1934-10-22, 82 y.o.   MRN: [id number] Primary Heart Failure: Dr Haroldine Laws Cardiac Surgeon: Dr Prescott Gum  HPI: Ms Whittington is an 82  year old female with a PMH of HF due to severe NICM, chronic systolic HF,  PAF,  plasma cell disorder (Likely IgA MGUS) - followed by Dr. [name] (follwoed q 6 months). Underwent implantation of the HeartMate II LVAD on 04/06/13 for DT. She is not on aspirin due to dizziness. S/P lap cholecystitis 10/2015.   GI Issues 09/2016 LGIB. Angio- no source of bleeding. LVAD speed turned down to 8400  Today she returns for LVAD follow up. It is here 4-year VAD anniversary. Recently speed was increased to 8800. Says she feels much better. Doing all ADLs and getting out of the house with [name] frequently.  Denies SOB, orthopnea, PND or edema.  Taking all medications without problem. Denies bleeding, neuro symptoms, fevers/chills or problems with driveline. Still with low flows on VAD between 11a-3p often when napping. If occurs while walking around she drinks fluid and they resolve.   VAD Indication: Destination Therapy    VAD interrogation & Equipment Management: Speed: 8800 Flow: 3.4 Power:  4.3 w    PI: 6.4  Alarms:  Low Flow 7/3 x 2; 7/14 x 2; 7/15 x 14; 7/31 x 3; 8/4 x 1; 8/5 x 1-all these occur between 11am and 3pm. Pt states that she doesn't feel any different when they occur and the alarm resolves on its own. Events: rare PI  Fixed speed 8800  Low speed limit: 8200  Past Medical History:  Diagnosis Date  . AICD (automatic cardioverter/defibrillator) present   . Anemia   . Atrial fibrillation or flutter   . Cardiac arrest - ventricular fibrillation 12/10   with successful resucitation, S/p ICD  . CHF (congestive heart failure) ([place])   . Diverticula of colon 2011  . HTN (hypertension)    moderate  . ICD (implantable cardiac defibrillator) in place    she has received appropriate therapy  for VF  . Internal and external hemorrhoids without complication 6767  . Nonischemic cardiomyopathy ([place])    followed by Dr April [facility] at [facility]  . Osteopenia   . Plasma cell disorder 03/20/2012  . Plasma cell disorder 03/20/2012  . Sessile colonic polyp 2011   Dr Benson [place]    Current Outpatient Prescriptions  Medication Sig Dispense Refill  . amiodarone (PACERONE) 200 MG tablet TAKE 1 TABLET BY MOUTH EVERY DAY 30 tablet 2  . amLODipine (NORVASC) 2.5 MG tablet Take one tab daily at hour of sleep 30 tablet 6  . carvedilol (COREG) 6.25 MG tablet TAKE 1 TABLET (6.25 MG TOTAL) BY MOUTH 2 (TWO) TIMES DAILY WITH A MEAL. 60 tablet 11  . Cholecalciferol (VITAMIN D3) 1000 UNITS CAPS Take 1 tablet by mouth daily.      [name] docusate sodium (COLACE) 100 MG capsule Take 100 mg by mouth daily.    . ferrous sulfate 325 (65 FE) MG tablet Take 1 tablet (325 mg total) by mouth 2 (two) times daily with a meal. 60 tablet 6  . gabapentin (NEURONTIN) 100 MG capsule Take 1 capsule (100 mg total) by mouth at bedtime. 30 capsule 5  . hydrALAZINE (APRESOLINE) 50 MG tablet TAKE 1 AND 1/2 TABLETS BY MOUTH 3 TIMES A DAY (Patient taking differently: 2 tablets 3x a day) 270 tablet 3  . losartan (COZAAR) 50 MG tablet Take  2 tablets (100 mg total) by mouth daily. 180 tablet 3  . pantoprazole (PROTONIX) 40 MG tablet TAKE 1 TABLET BY MOUTH EVERY DAY 30 tablet 6  . potassium chloride SA (KLOR-CON M20) 20 MEQ tablet Take 1 tablet (20 mEq total) by mouth 2 (two) times daily. 60 tablet 11  . traMADol (ULTRAM) 50 MG tablet Take 2 tablets (100 mg total) by mouth every 12 (twelve) hours as needed for moderate pain. 60 tablet 3  . warfarin (COUMADIN) 2.5 MG tablet Take 2.5-5 mg by mouth daily. Take 5 mg (2 tabs) daily except 2.5 mg (1 tab) on Thurs and Sat.    . furosemide (LASIX) 20 MG tablet Take 1 tablet (20 mg total) by mouth daily as needed (Only when recommended by CHF clinic.). (Patient not taking: Reported on 04/08/2017) 30 tablet 6   No  current facility-administered medications for this encounter.     Patient has no known allergies.  REVIEW OF SYSTEMS: All systems negative except as listed in HPI, PMH and Problem list.  Vitals:   04/08/17 1010 04/08/17 1011  BP: (!) 120/0 124/89  Pulse:  74   Vital Signs:  Doppler Pressure 120  Automatc BP: 124/89 (93) HR: 74 SPO2: 98 %  Weight: 176  lb w/o eqt Last weight: 181 lb    Physical Exam: General:  NAD.  HEENT: normal  Neck: supple. JVP not elevated.  Carotids 2+ bilat; no bruits. No lymphadenopathy or thryomegaly appreciated. Cor: LVAD hum.  Lungs: Clear. Abdomen: soft, nontender, non-distended. No hepatosplenomegaly. No bruits or masses. Good bowel sounds. Driveline site clean. Anchor in place.  Extremities: no cyanosis, clubbing, rash. Warm no edema  Neuro: alert & oriented x 3. No focal deficits. Moves all 4 without problem   ASSESSMENT AND PLAN:  1) Chronic systolic HF: NICM, s/p ICD and LVAD for DT(04/2013).  --this month is 4-year VAD anniversary - Doing well. Stable NYHA II, though only able to complete 2:30 minutes on 6MW  - Volume status looks good.Continue lasix as needed.  2) LVAD placed for DT 04/2013:  - Doing better with increased speed. Continue 8800 - No asa with GI bleed and intolerance.  - Continue coumadin. No bleeding problems.  - VAD parameters viewed personally. Still with frequent low flows but well tolerated. Likely a combination of positional issue and low volume. Hgb stable. Rhythm ok. Off diuretics,.  3) PAF:  -Remains in NSR.  Continue amio 100 mg daily. Follow TSH and LFTs.  Needs yearly eye exams.  May consider stopping at next visit.  4) HTN:  - Improved. Continue current dose of hydralazine and carvedilol. Add back losartan 50 mg daily.  5) Chronic Anticoagulation:  -Goal INR 1.8-2.3. INR 2.60. Continue coumadin. Discussed with PharmD - LDH 248   Total time spent 35 minutes. Over half that time spent discussing above.    Glori Bickers, MD  11:02 PM

## 2017-04-08 NOTE — Progress Notes (Signed)
Patient presents for 2 month follow up in [facility] today. Reports no problems with VAD equipment or concerns with drive line. Reports she is feeling better today; has taken Lasix in over a month.   Vital Signs:  Doppler Pressure 120  Automatc BP: 124/89 (93) HR: 74 SPO2: 98 %  Weight: 176  lb w/o eqt Last weight: 181 lb  VAD Indication: Destination Therapy    VAD interrogation & Equipment Management: Speed: 8800 Flow: 3.4 Power:  4.3 w    PI: 6.4  Alarms:  Low Flow 7/3 x 2; 7/14 x 2; 7/15 x 14; 7/31 x 3; 8/4 x 1; 8/5 x 1-all these occur between 11am and 3pm. Pt states that she doesn't feel any different when they occur and the alarm resolves on its own. Events: rare PI  Fixed speed 8800  Low speed limit: 8200  Primary Controller:  Replace back up battery in 14 months. Back up controller:   Replace back up battery in 14 months.  Annual Equipment Maintenance on UBC/PM was performed on 04/2018.   I reviewed the LVAD parameters from today and compared the results to the patient's prior recorded data. LVAD interrogation was NEGATIVE for significant power changes, POSITIVE for clinical alarms and STABLE for PI events/speed drops. No programming changes were made and pump is functioning within specified parameters. Pt is performing daily controller and system monitor self tests along with completing weekly and monthly maintenance for LVAD equipment.  LVAD equipment check completed and is in good working order. Back-up equipment present. Charged back up battery and performed self-test on equipment.   Exit Site Care: Drive line is being maintained weekly  by [employer]. Drive line exit site well healed and incorporated. The velour is fully implanted at exit site. Dressing dry and intact. No erythema or drainage. Stabilization device present and accurately applied. Pt denies fever or chills. Pt given 9 weekly dressings.   Significant Events on VAD Support:  09/21/16 GIB, Ramp echo  speed decreased to 8400  01/08/17 - LOW FLOW alarms; speed increased to 8800 RPM  Device:St Jude ICD Therapies: on at 200 bpm Last check: appt this June for device check   BP & Labs: MAP 120 - Doppler is reflecting modified systolic  Hgb 73.4 - No S/S of bleeding. Specifically denies melena/BRBPR or nosebleeds.  LDH stable at 248 with established baseline of 170- 230. Denies tea-colored urine. No power elevations noted on interrogation.   4 year Intermacs follow up completed including:  Quality of Life, KCCQ-12, and Neurocognitive trail making 7 min).   Pt completed 400 feet during 6 minute walk but was only able to walk 2 1/2 min.  Reviewed and demonstrated the following to patient and caregiver:               Reviewed the steps for replacing the running system controller with the back-up system controller (see patient handbook section 2 or the appropriate pamphlet).             X  Demonstrated (using the mock-driveline and controller) how to connect and disconnect the mock-driveline in back up system controller in a timely manner (less than 10 seconds) with return demonstration by patient and caregiver.              X   Reviewed system controller alarms and troubleshooting including hazard and advisory alarms and accessing alarm history. Re-enforced NOT TO attempt to perform any task displayed on the display screen alone or without calling  the VAD pager [phone] (see patient handbook section 5 or the appropriate pamphlet).                       X  Reviewed how to handle an emergency including when the pump is running and when the pump has stopped (see patient handbook section 8). Call 911 first and then the VAD pager at [phone].             X   Reviewed 14-volt lithium ion battery calibration steps (see patient handbook section 3).            X   Reviewed contents of black bag and what must be with patient at all times.            X  Reviewed driveline exit site  including cleansing, dressing, and immobilizing with an anchor device to prevent exit site trauma.             X  Reviewed weekly maintenance which includes: Reviewing "Replacing the Progress with a [employer]" pamphlet. Clean batteries, clips, and battery charger contacts.  Check cables for damages. Rotate batteries; keep all 8 charged.              X  Reviewed monthly maintenance which includes: Reviewing Alarms and Troubleshooting. Check battery manufacturer dates. Check use/charge cycles for each battery; remember to re-calibrate every 70 uses when prompted.              X  Additional pamphlets Guide to Replacing the [place] with the [place] for Patients and Their Caregivers and HM II Alarms for Patients and Their Caregivers given to patient.              X            Annual maintenance completed per [employer] on patient's home power module and Electrical engineer.     Pt received new pt cable ([id number]) and new internal battery ([id number] 10/31/19).  Backup system controller 11 volt battery charged during visit.    Back up controller:  11V backup battery charged during this visit.  Plan: 1. Return to clinic in 2 months.   Tanda Rockers RN [place] Coordinator   Office: [phone] 24/7 Emergency VAD Pager: [phone]

## 2017-04-15 ENCOUNTER — Ambulatory Visit (HOSPITAL_COMMUNITY): Payer: Self-pay | Admitting: Pharmacist

## 2017-04-15 DIAGNOSIS — Z7901 Long term (current) use of anticoagulants: Secondary | ICD-10-CM | POA: Diagnosis not present

## 2017-04-15 LAB — POCT INR: INR: 3.3

## 2017-04-22 ENCOUNTER — Ambulatory Visit (HOSPITAL_COMMUNITY): Payer: Self-pay | Admitting: Pharmacist

## 2017-04-22 LAB — POCT INR: INR: 3.3

## 2017-04-29 ENCOUNTER — Ambulatory Visit (HOSPITAL_COMMUNITY): Payer: Self-pay | Admitting: Pharmacist

## 2017-04-29 LAB — POCT INR: INR: 1.9

## 2017-05-06 ENCOUNTER — Ambulatory Visit (HOSPITAL_COMMUNITY): Payer: Self-pay | Admitting: Pharmacist

## 2017-05-06 ENCOUNTER — Ambulatory Visit (INDEPENDENT_AMBULATORY_CARE_PROVIDER_SITE_OTHER): Payer: Medicare Other | Admitting: *Deleted

## 2017-05-06 DIAGNOSIS — I429 Cardiomyopathy, unspecified: Secondary | ICD-10-CM

## 2017-05-06 LAB — POCT INR: INR: 2.3

## 2017-05-07 NOTE — Progress Notes (Signed)
Remote ICD transmission.   

## 2017-05-11 ENCOUNTER — Other Ambulatory Visit (HOSPITAL_COMMUNITY): Payer: Self-pay | Admitting: Internal Medicine

## 2017-05-12 MED ORDER — HYDRALAZINE HCL 100 MG PO TABS: 100.0000 mg | ORAL_TABLET | Freq: Three times a day (TID) | ORAL | 6 refills | Status: DC

## 2017-05-13 ENCOUNTER — Ambulatory Visit (HOSPITAL_COMMUNITY): Payer: Self-pay | Admitting: Pharmacist

## 2017-05-13 DIAGNOSIS — Z7901 Long term (current) use of anticoagulants: Secondary | ICD-10-CM | POA: Diagnosis not present

## 2017-05-13 LAB — POCT INR: INR: 2.1

## 2017-05-14 ENCOUNTER — Telehealth (HOSPITAL_COMMUNITY): Payer: Self-pay | Admitting: *Deleted

## 2017-05-14 ENCOUNTER — Encounter: Payer: Self-pay | Admitting: Cardiology
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
## 2017-05-16 ENCOUNTER — Other Ambulatory Visit: Payer: Self-pay | Admitting: Internal Medicine

## 2017-05-19 ENCOUNTER — Other Ambulatory Visit (HOSPITAL_COMMUNITY): Payer: Self-pay | Admitting: *Deleted

## 2017-05-19 ENCOUNTER — Telehealth (HOSPITAL_COMMUNITY): Payer: Self-pay | Admitting: *Deleted

## 2017-05-19 DIAGNOSIS — Z95811 Presence of heart assist device: Secondary | ICD-10-CM

## 2017-05-19 LAB — CUP PACEART REMOTE DEVICE CHECK
Battery Remaining Longevity: 26 mo
Battery Remaining Percentage: 30 %
Brady Statistic RA Percent Paced: 4.2 %
Brady Statistic RV Percent Paced: 1 % — CL
Date Time Interrogation Session: 20180917145208
HighPow Impedance: 40 Ohm
Implantable Lead Implant Date: 20110104
Implantable Lead Implant Date: 20110104
Implantable Lead Location: 753859
Implantable Lead Location: 753860
Implantable Lead Model: 7121
Implantable Pulse Generator Implant Date: 20110104
Lead Channel Impedance Value: 290 Ohm
Lead Channel Impedance Value: 290 Ohm
Lead Channel Sensing Intrinsic Amplitude: 0.5 mV
Lead Channel Sensing Intrinsic Amplitude: 8.1 mV
Lead Channel Setting Pacing Amplitude: 2 V
Lead Channel Setting Pacing Amplitude: 2.5 V
Lead Channel Setting Pacing Pulse Width: 0.5 ms
Lead Channel Setting Sensing Sensitivity: 0.5 mV
Pulse Gen Serial Number: 754815

## 2017-05-19 NOTE — Telephone Encounter (Signed)
Received page from [place], patients caregiver, stating patient has had several "low flow" alarms. Instructed to hold Lasix and bring patient to clinic at 9 am tomorrow. Encourage patient to drink fluids. Will discuss with Darrick Grinder NP.   Balinda Quails RN, VAD Coordinator 24/7 pager [phone]

## 2017-05-20 ENCOUNTER — Encounter (HOSPITAL_COMMUNITY): Payer: Self-pay

## 2017-05-20 ENCOUNTER — Ambulatory Visit (HOSPITAL_COMMUNITY)
Admission: RE | Admit: 2017-05-20 | Discharge: 2017-05-20 | Disposition: A | Discharge status: Home / Self Care | Payer: Medicare Other | Source: Ambulatory Visit | Attending: Cardiology | Admitting: Cardiology

## 2017-05-20 ENCOUNTER — Ambulatory Visit (HOSPITAL_COMMUNITY): Payer: Self-pay | Admitting: Pharmacist

## 2017-05-20 VITALS — BP 112/83 | HR 76 | Resp 16 | Ht 60.0 in | Wt 176.6 lb

## 2017-05-20 DIAGNOSIS — M8580 Other specified disorders of bone density and structure, unspecified site: Secondary | ICD-10-CM | POA: Diagnosis not present

## 2017-05-20 DIAGNOSIS — Z8674 Personal history of sudden cardiac arrest: Secondary | ICD-10-CM | POA: Insufficient documentation

## 2017-05-20 DIAGNOSIS — I10 Essential (primary) hypertension: Secondary | ICD-10-CM | POA: Diagnosis not present

## 2017-05-20 DIAGNOSIS — Z4502 Encounter for adjustment and management of automatic implantable cardiac defibrillator: Secondary | ICD-10-CM | POA: Insufficient documentation

## 2017-05-20 DIAGNOSIS — I429 Cardiomyopathy, unspecified: Secondary | ICD-10-CM | POA: Diagnosis not present

## 2017-05-20 DIAGNOSIS — Z95811 Presence of heart assist device: Secondary | ICD-10-CM

## 2017-05-20 DIAGNOSIS — Z7901 Long term (current) use of anticoagulants: Secondary | ICD-10-CM | POA: Insufficient documentation

## 2017-05-20 DIAGNOSIS — I480 Paroxysmal atrial fibrillation: Secondary | ICD-10-CM | POA: Diagnosis not present

## 2017-05-20 DIAGNOSIS — I110 Hypertensive heart disease with heart failure: Secondary | ICD-10-CM | POA: Insufficient documentation

## 2017-05-20 DIAGNOSIS — I5022 Chronic systolic (congestive) heart failure: Secondary | ICD-10-CM | POA: Diagnosis not present

## 2017-05-20 DIAGNOSIS — Z79899 Other long term (current) drug therapy: Secondary | ICD-10-CM

## 2017-05-20 DIAGNOSIS — Z95810 Presence of automatic (implantable) cardiac defibrillator: Secondary | ICD-10-CM | POA: Diagnosis not present

## 2017-05-20 LAB — CBC
HCT: 37.6 % (ref 36.0–46.0)
Hemoglobin: 12.2 g/dL (ref 12.0–15.0)
MCH: 30.1 pg (ref 26.0–34.0)
MCHC: 32.4 g/dL (ref 30.0–36.0)
MCV: 92.8 fL (ref 78.0–100.0)
Platelets: 216 10*3/uL (ref 150–400)
RBC: 4.05 MIL/uL (ref 3.87–5.11)
RDW: 16.1 % — ABNORMAL HIGH (ref 11.5–15.5)
WBC: 6 10*3/uL (ref 4.0–10.5)

## 2017-05-20 LAB — BASIC METABOLIC PANEL
Anion gap: 6 (ref 5–15)
BUN: 12 mg/dL (ref 6–20)
CO2: 21 mmol/L — ABNORMAL LOW (ref 22–32)
Calcium: 8.8 mg/dL — ABNORMAL LOW (ref 8.9–10.3)
Chloride: 109 mmol/L (ref 101–111)
Creatinine, Ser: 1.03 mg/dL — ABNORMAL HIGH (ref 0.44–1.00)
GFR calc Af Amer: 58 mL/min — ABNORMAL LOW (ref >60)
GFR calc non Af Amer: 50 mL/min — ABNORMAL LOW (ref >60)
Glucose, Bld: 88 mg/dL (ref 65–99)
Potassium: 4.2 mmol/L (ref 3.5–5.1)
Sodium: 136 mmol/L (ref 135–145)

## 2017-05-20 LAB — PROTIME-INR
INR: 1.86
Prothrombin Time: 21.3 seconds — ABNORMAL HIGH (ref 11.4–15.2)

## 2017-05-20 LAB — LACTATE DEHYDROGENASE: LDH: 241 U/L — ABNORMAL HIGH (ref 98–192)

## 2017-05-20 MED ORDER — HYDRALAZINE HCL 100 MG PO TABS: 100.0000 mg | ORAL_TABLET | Freq: Three times a day (TID) | ORAL | 6 refills | Status: DC

## 2017-05-20 MED ORDER — AMLODIPINE BESYLATE 5 MG PO TABS: ORAL_TABLET | ORAL | 6 refills | Status: DC

## 2017-05-20 NOTE — Progress Notes (Signed)
ADVANCED HF CLINIC NOTE  Patient ID: Brianna Hanson, female   DOB: Jan 20, 1935, 82 y.o.   MRN: [id number] Primary Heart Failure: Dr Haroldine Laws Cardiac Surgeon: Dr Prescott Gum  HPI: Ms Skeet is an 82  year old female with a PMH of HF due to severe NICM, chronic systolic HF,  PAF,  plasma cell disorder (Likely IgA MGUS) - followed by Dr. [name] (follwoed q 6 months). Underwent implantation of the HeartMate II LVAD on 04/06/13 for DT. She is not on aspirin due to dizziness. S/P lap cholecystitis 10/2015.   GI Issues 09/2016 LGIB. Angio- no source of bleeding. LVAD speed turned down to 8400  LVAD Speed Change 12/2016 Ramp ECHO -->Speed increased from 8400>8800  Todays she returns for an acute visit for LVAD low flow alarms noted over the last 3-4 days. Overall feeling fine. Denies SOB/PND/Orthopnea. Appetite ok. Says she has been drinking fluids. Has not had lasix in over 1 month.No fever or chills. Weight at home 170 pounds. Taking all medications. No BRBPR. Drive dressing is being changed weekly by her husband.    VAD Indication: Destination Therapy    VAD interrogation & Equipment Management: Speed: 8800 Flow: 3.2 Power: 4/4 PI: 6.6 Alarms:   Fixed speed 8800  Low speed limit: 8200  Events- Low Flow -->9/17 41 low flow alarms, 9/16 9, 9/15 23, 9/14 4, 9/13 25, 9/12 2, 9/11 4.   Past Medical History:  Diagnosis Date  . AICD (automatic cardioverter/defibrillator) present   . Anemia   . Atrial fibrillation or flutter   . Cardiac arrest - ventricular fibrillation 12/10   with successful resucitation, S/p ICD  . CHF (congestive heart failure) ([place])   . Diverticula of colon 2011  . HTN (hypertension)    moderate  . ICD (implantable cardiac defibrillator) in place    she has received appropriate therapy for VF  . Internal and external hemorrhoids without complication 0737  . Nonischemic cardiomyopathy ([place])    followed by Dr April Holding at [facility]  . Osteopenia   . Plasma cell  disorder 03/20/2012  . Plasma cell disorder 03/20/2012  . Sessile colonic polyp 2011   Dr Benson [place]    Current Outpatient Prescriptions  Medication Sig Dispense Refill  . amiodarone (PACERONE) 200 MG tablet TAKE 1 TABLET BY MOUTH EVERY DAY 30 tablet 8  . amLODipine (NORVASC) 5 MG tablet Take one tab daily at hour of sleep 90 tablet 6  . carvedilol (COREG) 6.25 MG tablet TAKE 1 TABLET (6.25 MG TOTAL) BY MOUTH 2 (TWO) TIMES DAILY WITH A MEAL. 60 tablet 11  . Cholecalciferol (VITAMIN D3) 1000 UNITS CAPS Take 1 tablet by mouth daily.      [name] docusate sodium (COLACE) 100 MG capsule Take 100 mg by mouth daily.    . ferrous sulfate 325 (65 FE) MG tablet Take 1 tablet (325 mg total) by mouth 2 (two) times daily with a meal. 60 tablet 6  . furosemide (LASIX) 20 MG tablet Take 1 tablet (20 mg total) by mouth daily as needed (Only when recommended by CHF clinic.). 30 tablet 6  . gabapentin (NEURONTIN) 100 MG capsule Take 1 capsule (100 mg total) by mouth at bedtime. 30 capsule 5  . hydrALAZINE (APRESOLINE) 100 MG tablet Take 1 tablet (100 mg total) by mouth 3 (three) times daily. 360 tablet 6  . pantoprazole (PROTONIX) 40 MG tablet TAKE 1 TABLET BY MOUTH EVERY DAY 30 tablet 6  . potassium chloride SA (KLOR-CON M20) 20 MEQ  tablet Take 1 tablet (20 mEq total) by mouth 2 (two) times daily. 60 tablet 11  . traMADol (ULTRAM) 50 MG tablet Take 2 tablets (100 mg total) by mouth every 12 (twelve) hours as needed for moderate pain. 60 tablet 3  . warfarin (COUMADIN) 2.5 MG tablet Take 2.5-5 mg by mouth daily. Take 5 mg (2 tabs) daily except 2.5 mg (1 tab) on Thurs, Sat.    [name] losartan (COZAAR) 50 MG tablet Take 2 tablets (100 mg total) by mouth daily. 180 tablet 3   No current facility-administered medications for this encounter.     Patient has no known allergies.  REVIEW OF SYSTEMS: All systems negative except as listed in HPI, PMH and Problem list.  Vitals:   05/20/17 0923  BP: 112/83  Pulse: 76  Resp: 16    SpO2: 99%   Vital Signs:  Doppler Pressure 120  Automatc BP: 112/83 (94)  HR: 76 SPO2:99  Weight:176 pounds  Last weight: 176.6 pounds 181 lb    Physical Exam: GENERAL: Well appearing, woman who presents to clinic today in no acute distress.Ambulated in the clinic without difficulty.  HEENT: normal  NECK: Supple, JVP 5-6 .  2+ bilaterally, no bruits.  No lymphadenopathy or thyromegaly appreciated.   CARDIAC:  Mechanical heart sounds with LVAD hum present.  LUNGS:  Clear to auscultation bilaterally.  ABDOMEN:  Soft, round, nontender, positive bowel sounds x4.     LVAD exit site: well-healed and incorporated.  Dressing dry and intact.  No erythema or drainage.  Stabilization device present and accurately applied.  Driveline dressing is being changed daily per sterile technique. EXTREMITIES:  Warm and dry, no cyanosis, clubbing, rash or edema  NEUROLOGIC:  Alert and oriented x 4.  Gait steady.  No aphasia.  No dysarthria.  Affect pleasant.     ASSESSMENT AND PLAN:  1) Chronic systolic HF: NICM, s/p ICD and LVAD for DT(04/2013).  -NYHA II. Volume status stable. Does not need lasix.   2) LVAD placed for DT 04/2013:  Attempted to reproduce low flows today with repositioning. We were unable to reproduce.  Log files sent today.  Multiple low flow over the last 4 days. Maps a little high. Cut speed back to 8600 and follow up next week.  Increase amlodipine to 5 mg daily. - No asa with GI bleed and intolerance.  - Continue coumadin. No bleeding problems.  3) PAF:  - Continue amio 100 mg daily. Follow TSH and LFTs.  Needs yearly eye exams.  EKG next visit. May stop amio next week.  4) HTN:  - Maps a little high. Increase amlodipine to 5 mg daily. Continue current dose of hydralazine, carvedilol, and losartan. BMEt today. Stable.  5) Chronic Anticoagulation:  -Goal INR 1.8-2.3. INR . INR 1.86 Continue coumadin. Discussed with Pharm D.   INR 1.86, renal function stable, hemoglobin  stable.  LDH 241 stable today.    Follow up next week to review VAD parameters, EKG,Maps, and TSH.  Greater than 50% of the  40 minutes visit spent in counseling/coordination of care regarding  vad parameters, HTN, and lab evaluation.    SEE LVAD Coordinators Note  Patient presents for sick visit  in [facility] today. Reports no problems with VAD equipment or concerns with drive line. Reports multiple low flow alarms starting Saturday. States she feels normal even during alarms. State she drank plenty of fluids yesterday.  Vital Signs:  Doppler Pressure 120  Automatc BP: 112/83 (94) HR:76 SPO2:99  %  Weight: 176.6 lb w/o eqt Last weight: 176 lb Home weights: 170-175 lbs   VAD Indication: Destination Therapy- age excluding  VAD interrogation & Equipment Management: Speed:8800>> changed to 8600 Flow: 3.2 Power:4.2 w PI:6.6  Alarms: no clinical alarms Events: 41 low flow on 9/17, 9 on 9/16, 23 on 9/15, 4 on 9/14, 25 on 9/13, 2 on 9/12, 4 on 9/11, 5 on 9/10, and 2 on 9/9  Fixed speed 8800 Low speed limit: 8200  Primary Controller: Replace back up battery in 28months. Back up controller: Replace back up battery in 72months.  Annual Equipment Maintenance on UBC/PM was performed on 04/2017.  I reviewed the LVAD parameters from todayand compared the results to the patient's prior recorded data.LVAD interrogation was NEGATIVEfor significant power changes, NEGATIVEfor clinicalalarms and STABLEfor PI events/speed drops. No programming changes were madeand pump is functioning within specified parameters. Pt is performing daily controller and system monitor self tests along with completing weekly and monthly maintenance for LVAD equipment.  LVAD equipment check completed and is in good working order. Back-up equipment present. Charged back up battery and performed self-test on equipment.   Exit Site Care: Drive line is being maintained weekly by  [employer]. Drive line exit site well healed and incorporated. The velour is fully implanted at exit site. Dressing dry and intact. No erythema or drainage. Stabilization device present and accurately applied. Pt denies fever or chills. Pt states they have adequate dressing supplies at home.   Significant Events on VAD Support:  09/21/16 GIB, Ramp echo speed decreased to 8400  01/08/17 - LOW FLOW alarms; speed increased to 8800 RPM 05/20/17> speed decreased to 8600  Device:St Jude ICD Therapies: on at 200 bpm Last check: 01/2017   BP &Labs:  MAP120- Doppler is reflecting modified systolic  Hgb 63.1- No S/S of bleeding. Specifically denies melena/BRBPR or nosebleeds.  LDH stable at 241with established baseline of 180- 250. Denies tea-colored urine. No power elevations noted on interrogation.   Plan: 1. Increase norvasc to 5 mg daily 2. RTC in one week for MD visit. 3. Speed increased to 8600. Back up controller programmed to updated settings.  Balinda Quails RN VAD Coordinator   Office: [phone] 24/7 Emergency VAD Pager: [phone]   Darrick Grinder, NP  9:09 AM

## 2017-05-20 NOTE — Progress Notes (Signed)
Patient presents for sick visit  in [facility] today. Reports no problems with VAD equipment or concerns with drive line. Reports multiple low flow alarms starting Saturday. States she feels normal even during alarms. State she drank plenty of fluids yesterday.  Vital Signs:  Doppler Pressure 120   Automatc BP: 112/83 (94) HR:76  SPO2:99  %  Weight: 176.6 lb w/o eqt Last weight: 176 lb Home weights: 170-175 lbs   VAD Indication: Destination Therapy- age excluding  VAD interrogation & Equipment Management: Speed:8800>> changed to 8600 Flow: 3.2 Power:4.2 w    PI:6.6  Alarms: no clinical alarms Events: 41 low flow on 9/17, 9 on 9/16, 23 on 9/15, 4 on 9/14, 25 on 9/13, 2 on 9/12, 4 on 9/11, 5 on 9/10, and 2 on 9/9  Fixed speed 8800 Low speed limit: 8200  Primary Controller:  Replace back up battery in 47months. Back up controller:   Replace back up battery in 13 months.  Annual Equipment Maintenance on UBC/PM was performed on 04/2017.   I reviewed the LVAD parameters from today and compared the results to the patient's prior recorded data. LVAD interrogation was NEGATIVE for significant power changes, NEGATIVE for clinical alarms and STABLE for PI events/speed drops. No programming changes were made and pump is functioning within specified parameters. Pt is performing daily controller and system monitor self tests along with completing weekly and monthly maintenance for LVAD equipment.  LVAD equipment check completed and is in good working order. Back-up equipment present. Charged back up battery and performed self-test on equipment.   Exit Site Care: Drive line is being maintained weekly  by [employer]. Drive line exit site well healed and incorporated. The velour is fully implanted at exit site. Dressing dry and intact. No erythema or drainage. Stabilization device present and accurately applied. Pt denies fever or chills. Pt states they have adequate dressing supplies at  home.   Significant Events on VAD Support:  09/21/16 GIB, Ramp echo speed decreased to 8400  01/08/17 - LOW FLOW alarms; speed increased to 8800 RPM 05/20/17> speed decreased to 8600  Device:St Jude ICD Therapies: on at 200 bpm Last check: 01/2017   BP & Labs:  MAP120 - Doppler is reflecting modified systolic  Hgb 23.3 - No S/S of bleeding. Specifically denies melena/BRBPR or nosebleeds.  LDH stable at 241 with established baseline of 180- 250. Denies tea-colored urine. No power elevations noted on interrogation.   Plan: 1. Increase norvasc to 5 mg daily 2. RTC in one week for MD visit. 3. Speed increased to 8600. Back up controller programmed to updated settings.  Balinda Quails RN [place] Coordinator   Office: [phone] 24/7 Emergency VAD Pager: [phone]

## 2017-05-20 NOTE — Patient Instructions (Signed)
Return to clinic in one week.  Start taking Amlodipine (Norvasc) 5 mg daily.  A new script has been sent to your pharmacy.

## 2017-05-23 ENCOUNTER — Other Ambulatory Visit (HOSPITAL_COMMUNITY): Payer: Self-pay | Admitting: Unknown Physician Specialty

## 2017-05-23 DIAGNOSIS — Z7901 Long term (current) use of anticoagulants: Secondary | ICD-10-CM

## 2017-05-23 DIAGNOSIS — Z95811 Presence of heart assist device: Secondary | ICD-10-CM

## 2017-05-26 ENCOUNTER — Ambulatory Visit (HOSPITAL_COMMUNITY): Payer: Self-pay | Admitting: Pharmacist

## 2017-05-26 ENCOUNTER — Ambulatory Visit (HOSPITAL_COMMUNITY)
Admission: RE | Admit: 2017-05-26 | Discharge: 2017-05-26 | Disposition: A | Discharge status: Home / Self Care | Payer: Medicare Other | Source: Ambulatory Visit | Attending: Cardiology | Admitting: Cardiology

## 2017-05-26 ENCOUNTER — Telehealth (HOSPITAL_COMMUNITY): Payer: Self-pay | Admitting: *Deleted

## 2017-05-26 VITALS — BP 110/0 | HR 80 | Ht 60.0 in | Wt 179.4 lb

## 2017-05-26 DIAGNOSIS — I110 Hypertensive heart disease with heart failure: Secondary | ICD-10-CM | POA: Diagnosis not present

## 2017-05-26 DIAGNOSIS — R42 Dizziness and giddiness: Secondary | ICD-10-CM | POA: Diagnosis not present

## 2017-05-26 DIAGNOSIS — M8580 Other specified disorders of bone density and structure, unspecified site: Secondary | ICD-10-CM | POA: Diagnosis not present

## 2017-05-26 DIAGNOSIS — I10 Essential (primary) hypertension: Secondary | ICD-10-CM

## 2017-05-26 DIAGNOSIS — I5022 Chronic systolic (congestive) heart failure: Secondary | ICD-10-CM | POA: Insufficient documentation

## 2017-05-26 DIAGNOSIS — Z79899 Other long term (current) drug therapy: Secondary | ICD-10-CM

## 2017-05-26 DIAGNOSIS — I480 Paroxysmal atrial fibrillation: Secondary | ICD-10-CM

## 2017-05-26 DIAGNOSIS — Z8674 Personal history of sudden cardiac arrest: Secondary | ICD-10-CM | POA: Insufficient documentation

## 2017-05-26 DIAGNOSIS — Z79891 Long term (current) use of opiate analgesic: Secondary | ICD-10-CM | POA: Insufficient documentation

## 2017-05-26 DIAGNOSIS — Z86010 Personal history of colonic polyps: Secondary | ICD-10-CM | POA: Insufficient documentation

## 2017-05-26 DIAGNOSIS — Z95810 Presence of automatic (implantable) cardiac defibrillator: Secondary | ICD-10-CM | POA: Diagnosis not present

## 2017-05-26 DIAGNOSIS — Z7901 Long term (current) use of anticoagulants: Secondary | ICD-10-CM | POA: Diagnosis not present

## 2017-05-26 DIAGNOSIS — I429 Cardiomyopathy, unspecified: Secondary | ICD-10-CM | POA: Insufficient documentation

## 2017-05-26 DIAGNOSIS — Z95811 Presence of heart assist device: Secondary | ICD-10-CM

## 2017-05-26 LAB — LACTATE DEHYDROGENASE: LDH: 254 U/L — ABNORMAL HIGH (ref 98–192)

## 2017-05-26 LAB — BASIC METABOLIC PANEL
Anion gap: 4 — ABNORMAL LOW (ref 5–15)
BUN: 8 mg/dL (ref 6–20)
CO2: 25 mmol/L (ref 22–32)
Calcium: 8.8 mg/dL — ABNORMAL LOW (ref 8.9–10.3)
Chloride: 109 mmol/L (ref 101–111)
Creatinine, Ser: 0.93 mg/dL (ref 0.44–1.00)
GFR calc Af Amer: >60 mL/min (ref >60)
GFR calc non Af Amer: 57 mL/min — ABNORMAL LOW (ref >60)
Glucose, Bld: 88 mg/dL (ref 65–99)
Potassium: 4.4 mmol/L (ref 3.5–5.1)
Sodium: 138 mmol/L (ref 135–145)

## 2017-05-26 LAB — CBC
HCT: 37.1 % (ref 36.0–46.0)
Hemoglobin: 11.8 g/dL — ABNORMAL LOW (ref 12.0–15.0)
MCH: 29.9 pg (ref 26.0–34.0)
MCHC: 31.8 g/dL (ref 30.0–36.0)
MCV: 94.2 fL (ref 78.0–100.0)
Platelets: 196 10*3/uL (ref 150–400)
RBC: 3.94 MIL/uL (ref 3.87–5.11)
RDW: 16.3 % — ABNORMAL HIGH (ref 11.5–15.5)
WBC: 5.3 10*3/uL (ref 4.0–10.5)

## 2017-05-26 LAB — PROTIME-INR
INR: 2.46
Prothrombin Time: 26.5 seconds — ABNORMAL HIGH (ref 11.4–15.2)

## 2017-05-26 MED ORDER — AMLODIPINE BESYLATE 10 MG PO TABS: ORAL_TABLET | ORAL | 6 refills | Status: DC

## 2017-05-26 NOTE — Progress Notes (Signed)
Patient presents for 1 week f/u in [facility] today. Reports no problems with VAD equipment or concerns with drive line. Pt states that she is feeling much better and has not had anymore low flow alarms to her knowledge.  Vital Signs:  Doppler Pressure: 110  Automatc BP: 128/96 (111) HR: 80 SPO2:96  %  Weight: 179.4 lb w/o eqt Last weight: 176.6 lb Home weights: 170-175 lbs   VAD Indication: Destination Therapy- age excluding  VAD interrogation & Equipment Management: Speed: 8600 Flow: 4.0 Power: 4.4 w    PI: 6.6  Alarms: 3 Low flows Events: 10+ daily Fixed speed 8600 Low speed limit: 8000  Primary Controller:  Replace back up battery in 33months. Back up controller:   Replace back up battery in 13 months.  Annual Equipment Maintenance on UBC/PM was performed on 04/2017.   I reviewed the LVAD parameters from today and compared the results to the patient's prior recorded data. LVAD interrogation was NEGATIVE for significant power changes, NEGATIVE for clinical alarms and STABLE for PI events/speed drops. No programming changes were made and pump is functioning within specified parameters. Pt is performing daily controller and system monitor self tests along with completing weekly and monthly maintenance for LVAD equipment.  LVAD equipment check completed and is in good working order. Back-up equipment present. Charged back up battery and performed self-test on equipment.   Exit Site Care: Drive line is being maintained weekly  by [employer]. Drive line exit site well healed and incorporated. The velour is fully implanted at exit site. Dressing dry and intact. No erythema or drainage. Stabilization device present and accurately applied. Pt denies fever or chills. Pt states they have adequate dressing supplies at home.   Significant Events on VAD Support:  09/21/16 GIB, Ramp echo speed decreased to 8400  01/08/17 - LOW FLOW alarms; speed increased to 8800 RPM 05/20/17> speed  decreased to 8600  Device:St Jude ICD Therapies: on at 200 bpm Last check: 01/2017   BP & Labs:  MAP 110 - Doppler is reflecting modified systolic  Hgb 28.0 - No S/S of bleeding. Specifically denies melena/BRBPR or nosebleeds.  LDH stable at 254 with established baseline of 180- 250. Denies tea-colored urine. No power elevations noted on interrogation.   Plan: 1. Increase norvasc to 10 mg daily 2. RTC in two week for MD visit.   Tanda Rockers RN [place] Coordinator   Office: [phone] 24/7 Emergency VAD Pager: [phone]

## 2017-05-26 NOTE — Telephone Encounter (Signed)
Patient made aware of [employer] Device [employer] which applies to [employer] XS PT test strips that are used with all [employer] patient self-testing instruments. Plans made to have INR checked at our clinic or local laboratory until replacement test strips are delivered. Informed patient that md-INR will be contacting them with further details and instructions.   Lesley Wilson RN, VAD Coordinator 24/7 pager [phone]   

## 2017-05-27 NOTE — Progress Notes (Signed)
ADVANCED HF CLINIC NOTE  Patient ID: Brianna Hanson, female   DOB: 1934-09-03, 82 y.o.   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 [name] (follwoed q 6 months). Underwent implantation of the HeartMate II LVAD on 04/06/13 for DT. She is not on aspirin due to dizziness. S/P lap cholecystitis 10/2015.   GI Issues 09/2016 LGIB. Angio- no source of bleeding. LVAD speed turned down to 8400  LVAD Speed Change 12/2016 Ramp ECHO -->Speed increased from 8400>8800  Today she returns for LVAD follow up. Last week LVAD speed was down to 8600 due to multiple low flow alarms. Also amlodipine was increased to 5 mg per hour.  Overall feeling great. Denies SOB/PND/Orthopnea. Weight at home 168-170 pounds. No BRBPR. No fever or chills. Taking all medications.    VAD Indication: Destination Therapy    VAD interrogation & Equipment Management: Speed: 8600 Flow: 4.0 Power: 4.4 PI: 6.8 Alarms:   Fixed speed 8600  Low speed limit: 8000  Events- Low Flow --> 3 lows flows since 9/18   Past Medical History:  Diagnosis Date  . AICD (automatic cardioverter/defibrillator) present   . Anemia   . Atrial fibrillation or flutter   . Cardiac arrest - ventricular fibrillation 12/10   with successful resucitation, S/p ICD  . CHF (congestive heart failure) ([place])   . Diverticula of colon 2011  . HTN (hypertension)    moderate  . ICD (implantable cardiac defibrillator) in place    she has received appropriate therapy for VF  . Internal and external hemorrhoids without complication 6283  . Nonischemic cardiomyopathy ([place])    follow[facility] by Dr April [facility] at [facility]  . Osteopenia   . Plasma cell disorder 03/20/2012  . Plasma cell disorder 03/20/2012  . Sessile colonic polyp 2011   Dr Benson [place]    Current Outpatient  Prescriptions  Medication Sig Dispense Refill  . amiodarone (PACERONE) 200 MG tablet TAKE 1 TABLET BY MOUTH EVERY DAY 30 tablet 8  . amLODipine (NORVASC) 10 MG tablet Take one tab daily at hour of sleep 30 tablet 6  . carvedilol (COREG) 6.25 MG tablet TAKE 1 TABLET (6.25 MG TOTAL) BY MOUTH 2 (TWO) TIMES DAILY WITH A MEAL. 60 tablet 11  . Cholecalciferol (VITAMIN D3) 1000 UNITS CAPS Take 1 tablet by mouth daily.      [name] docusate sodium (COLACE) 100 MG capsule Take 100 mg by mouth daily.    . ferrous sulfate 325 (65 FE) MG tablet Take 1 tablet (325 mg total) by mouth 2 (two) times daily with a meal. 60 tablet 6  . furosemide (LASIX) 20 MG tablet Take 1 tablet (20 mg total) by mouth daily as needed (Only when recommended by CHF clinic.). 30 tablet 6  . gabapentin (NEURONTIN) 100 MG capsule Take 1 capsule (100 mg total) by mouth at bedtime. 30 capsule 5  . hydrALAZINE (APRESOLINE) 100 MG tablet Take 1 tablet (100 mg total) by mouth 3 (three) times daily. 360 tablet 6  . pantoprazole (PROTONIX) 40 MG tablet TAKE 1 TABLET BY MOUTH EVERY DAY 30 tablet 6  . potassium chloride SA (KLOR-CON M20) 20 MEQ tablet Take 1 tablet (20 mEq total) by mouth 2 (two) times daily. 60 tablet 11  . traMADol (ULTRAM) 50 MG  tablet Take 2 tablets (100 mg total) by mouth every 12 (twelve) hours as needed for moderate pain. 60 tablet 3  . warfarin (COUMADIN) 2.5 MG tablet Take 2.5-5 mg by mouth daily. Take 5 mg (2 tabs) daily except 2.5 mg (1 tab) on Tues, Sat.    [name] losartan (COZAAR) 50 MG tablet Take 2 tablets (100 mg total) by mouth daily. 180 tablet 3   No current facility-administered medications for this encounter.     Patient has no known allergies.  REVIEW OF SYSTEMS: All systems negative except as listed in HPI, PMH and Problem list.  Vitals:   05/26/17 0912 05/26/17 0913  BP: (!) 128/96 (!) 110/0  Pulse: 80   SpO2: 96%    Vital Signs:  Doppler Pressure 110  Automatc BP: 128/96 (111)  HR: 80 SPO2: 96    Weight: 179 pounds  Last weight: 176 pounds   Physical Exam: GENERAL: Well appearing, woman. NAD. Walked in the clinic.  HEENT: normal  NECK: Supple, JVP 5-6  .  2+ bilaterally, no bruits.  No lymphadenopathy or thyromegaly appreciated.   CARDIAC:  Mechanical heart sounds with LVAD hum present.  LUNGS:  Clear to auscultation bilaterally.  ABDOMEN:  Soft, round, nontender, positive bowel sounds x4.     LVAD exit site: well-healed and incorporated.  Dressing dry and intact.  No erythema or drainage.  Stabilization device present and accurately applied.  Driveline dressing is being changed daily per sterile technique. EXTREMITIES:  Warm and dry, no cyanosis, clubbing, rash or edema  NEUROLOGIC:  Alert and oriented x 4.  Gait steady.  No aphasia.  No dysarthria.  Affect pleasant.     ASSESSMENT AND PLAN: 1) Chronic systolic HF: NICM, s/p ICD and LVAD for DT(04/2013).  -NYHA II. Overall much better.  Volume status stable. Does not need lasix.  Volume status stable. Does not need lasix.   2) LVAD placed for DT 04/2013:  Last visit speed turned down 8600. Only 3 low flows since the last visit.  No HF symptoms. Continue current speed and will add amlodipine to lower MAPs.  - No asa with GI bleed and intolerance.  - Continue coumadin. No bleeding problems.  3) PAF:  - Continue amio 100 mg daily. Follow TSH and LFTs.  Needs yearly eye exams.   Consider stopping at the next visit.   4) HTN:  - Elevated. Increase amlodipine 10 mg daily.  Continue current dose of hydralazine, carvedilol, and losartan.   5) Chronic Anticoagulation:  -Goal INR 1.8-2.3. INR . INR 2.46. Continue coumadin. Discussed with Pharm D.    Follow up in a few weeks. Plan to check TSH and LFTs at that time. Also need to follow Maps closely.   Amy Clegg 9:39 AM See LVAD Coordinator note.    Patient presents for 1 week f/u in [facility] today. Reports no problems with VAD equipment or concerns with drive line. Pt states  that she is feeling much better and has not had anymore low flow alarms to her knowledge.  Vital Signs:  Doppler Pressure: 110             Automatc BP: 128/96 (111) HR: 80 SPO2:96  %  Weight: 179.4 lb w/o eqt Last weight: 176.6 lb Home weights: 170-175 lbs   VAD Indication: Destination Therapy- age excluding  VAD interrogation & Equipment Management: Speed: 8600 Flow: 4.0 Power: 4.4 w PI: 6.6  Alarms: 3 Low flows Events: 10+ daily Fixed speed 8600 Low speed  limit: 8000  Primary Controller: Replace back up battery in 71months. Back up controller: Replace back up battery in 19months.  Annual Equipment Maintenance on UBC/PM was performed on 04/2017.  I reviewed the LVAD parameters from todayand compared the results to the patient's prior recorded data.LVAD interrogation was NEGATIVEfor significant power changes, NEGATIVEfor clinicalalarms and STABLEfor PI events/speed drops. No programming changes were madeand pump is functioning within specified parameters. Pt is performing daily controller and system monitor self tests along with completing weekly and monthly maintenance for LVAD equipment.  LVAD equipment check completed and is in good working order. Back-up equipment present. Charged back up battery and performed self-test on equipment.   Exit Site Care: Drive line is being maintained weekly by [employer]. Drive line exit site well healed and incorporated. The velour is fully implanted at exit site. Dressing dry and intact. No erythema or drainage. Stabilization device present and accurately applied. Pt denies fever or chills. Pt states they have adequate dressing supplies at home.   Significant Events on VAD Support:  09/21/16 GIB, Ramp echo speed decreased to 8400  01/08/17 - LOW FLOW alarms; speed increased to 8800 RPM 05/20/17> speed decreased to 8600  Device:St Jude ICD Therapies: on at 200 bpm Last check: 01/2017   BP &Labs:  MAP 110-  Doppler is reflecting modified systolic  Hgb 04.8- No S/S of bleeding. Specifically denies melena/BRBPR or nosebleeds.  LDH stable at 254with established baseline of 180- 250. Denies tea-colored urine. No power elevations noted on interrogation.   Plan: 1. Increase norvasc to 10 mg daily 2. RTC in two week for MD visit.   Tanda Rockers RN VAD Coordinator   Office: [phone] 24/7 Emergency VAD Pager: [phone]  Amy Clegg NP-C  9:50 AM

## 2017-05-30 ENCOUNTER — Other Ambulatory Visit (HOSPITAL_COMMUNITY): Payer: Self-pay | Admitting: *Deleted

## 2017-05-30 DIAGNOSIS — Z95811 Presence of heart assist device: Secondary | ICD-10-CM

## 2017-06-03 ENCOUNTER — Ambulatory Visit (HOSPITAL_COMMUNITY)
Admission: RE | Admit: 2017-06-03 | Discharge: 2017-06-03 | Disposition: A | Discharge status: Home / Self Care | Payer: Medicare Other | Source: Ambulatory Visit | Attending: Cardiology | Admitting: Cardiology

## 2017-06-03 ENCOUNTER — Ambulatory Visit (HOSPITAL_COMMUNITY): Payer: Self-pay | Admitting: Pharmacist

## 2017-06-03 DIAGNOSIS — Z95811 Presence of heart assist device: Secondary | ICD-10-CM | POA: Diagnosis not present

## 2017-06-03 LAB — PROTIME-INR
INR: 2.11
Prothrombin Time: 23.5 seconds — ABNORMAL HIGH (ref 11.4–15.2)

## 2017-06-09 ENCOUNTER — Ambulatory Visit (HOSPITAL_COMMUNITY): Payer: Self-pay | Admitting: Pharmacist

## 2017-06-09 ENCOUNTER — Other Ambulatory Visit (HOSPITAL_COMMUNITY): Payer: Self-pay | Admitting: Unknown Physician Specialty

## 2017-06-09 ENCOUNTER — Ambulatory Visit (HOSPITAL_COMMUNITY)
Admission: RE | Admit: 2017-06-09 | Discharge: 2017-06-09 | Disposition: A | Discharge status: Home / Self Care | Payer: Medicare Other | Source: Ambulatory Visit | Attending: Cardiology | Admitting: Cardiology

## 2017-06-09 VITALS — BP 118/0 | HR 77 | Ht 60.0 in | Wt 177.8 lb

## 2017-06-09 DIAGNOSIS — Z23 Encounter for immunization: Secondary | ICD-10-CM | POA: Diagnosis not present

## 2017-06-09 DIAGNOSIS — Z7901 Long term (current) use of anticoagulants: Secondary | ICD-10-CM

## 2017-06-09 DIAGNOSIS — I110 Hypertensive heart disease with heart failure: Secondary | ICD-10-CM | POA: Insufficient documentation

## 2017-06-09 DIAGNOSIS — Z95810 Presence of automatic (implantable) cardiac defibrillator: Secondary | ICD-10-CM | POA: Diagnosis not present

## 2017-06-09 DIAGNOSIS — Z95811 Presence of heart assist device: Secondary | ICD-10-CM

## 2017-06-09 DIAGNOSIS — I10 Essential (primary) hypertension: Secondary | ICD-10-CM

## 2017-06-09 DIAGNOSIS — I429 Cardiomyopathy, unspecified: Secondary | ICD-10-CM | POA: Diagnosis not present

## 2017-06-09 DIAGNOSIS — Z8674 Personal history of sudden cardiac arrest: Secondary | ICD-10-CM | POA: Insufficient documentation

## 2017-06-09 DIAGNOSIS — Z79899 Other long term (current) drug therapy: Secondary | ICD-10-CM | POA: Insufficient documentation

## 2017-06-09 DIAGNOSIS — I5022 Chronic systolic (congestive) heart failure: Secondary | ICD-10-CM | POA: Insufficient documentation

## 2017-06-09 DIAGNOSIS — I480 Paroxysmal atrial fibrillation: Secondary | ICD-10-CM | POA: Insufficient documentation

## 2017-06-09 LAB — BASIC METABOLIC PANEL
Anion gap: 7 (ref 5–15)
BUN: 10 mg/dL (ref 6–20)
CO2: 22 mmol/L (ref 22–32)
Calcium: 8.8 mg/dL — ABNORMAL LOW (ref 8.9–10.3)
Chloride: 109 mmol/L (ref 101–111)
Creatinine, Ser: 0.94 mg/dL (ref 0.44–1.00)
GFR calc Af Amer: >60 mL/min (ref >60)
GFR calc non Af Amer: 56 mL/min — ABNORMAL LOW (ref >60)
Glucose, Bld: 94 mg/dL (ref 65–99)
Potassium: 4.3 mmol/L (ref 3.5–5.1)
Sodium: 138 mmol/L (ref 135–145)

## 2017-06-09 LAB — CBC
HCT: 37.4 % (ref 36.0–46.0)
Hemoglobin: 12.1 g/dL (ref 12.0–15.0)
MCH: 30.3 pg (ref 26.0–34.0)
MCHC: 32.4 g/dL (ref 30.0–36.0)
MCV: 93.7 fL (ref 78.0–100.0)
Platelets: 209 10*3/uL (ref 150–400)
RBC: 3.99 MIL/uL (ref 3.87–5.11)
RDW: 16.1 % — ABNORMAL HIGH (ref 11.5–15.5)
WBC: 5.4 10*3/uL (ref 4.0–10.5)

## 2017-06-09 LAB — PROTIME-INR
INR: 2.29
Prothrombin Time: 25.1 seconds — ABNORMAL HIGH (ref 11.4–15.2)

## 2017-06-09 LAB — LACTATE DEHYDROGENASE: LDH: 230 U/L — ABNORMAL HIGH (ref 98–192)

## 2017-06-09 MED ORDER — SACUBITRIL-VALSARTAN 49-51 MG PO TABS: 1.0000 | ORAL_TABLET | Freq: Two times a day (BID) | ORAL | 6 refills | Status: DC

## 2017-06-09 MED ORDER — TRAMADOL HCL 50 MG PO TABS: 100.0000 mg | ORAL_TABLET | Freq: Two times a day (BID) | ORAL | 3 refills | Status: DC | PRN

## 2017-06-09 NOTE — Progress Notes (Signed)
Patient presents for 2 week f/u in [facility] today. Reports no problems with VAD equipment or concerns with drive line. Pt states that she is feeling much better and has not had anymore low flow alarms to her knowledge.  Vital Signs:  Doppler Pressure: 118  Automatc BP: 121/90 (101) HR: 77 SPO2:97  %  Weight: 177.8 lb w/o eqt Last weight: 179.4 lb Home weights: 170-175 lbs   VAD Indication: Destination Therapy- age excluding  VAD interrogation & Equipment Management: Speed: 8600 Flow: 3.8 Power: 4.2 w    PI: 6.7  Alarms: 1 Low flow on 9/27 Events: 20-30 daily Fixed speed 8600 Low speed limit: 8000  Primary Controller:  Replace back up battery in 12 months. Back up controller:   Replace back up battery in 12 months.  Annual Equipment Maintenance on UBC/PM was performed on 04/2018.   I reviewed the LVAD parameters from today and compared the results to the patient's prior recorded data. LVAD interrogation was NEGATIVE for significant power changes, NEGATIVE for clinical alarms and STABLE for PI events/speed drops. No programming changes were made and pump is functioning within specified parameters. Pt is performing daily controller and system monitor self tests along with completing weekly and monthly maintenance for LVAD equipment.  LVAD equipment check completed and is in good working order. Back-up equipment present. Charged back up battery and performed self-test on equipment.   Exit Site Care: Drive line is being maintained weekly  by [employer]. Drive line exit site well healed and incorporated. The velour is fully implanted at exit site. Dressing dry and intact. No erythema or drainage. Stabilization device present and accurately applied. Pt denies fever or chills. Pt states they have adequate dressing supplies at home.   Significant Events on VAD Support:  09/21/16 GIB, Ramp echo speed decreased to 8400  01/08/17 - LOW FLOW alarms; speed increased to 8800  RPM 05/20/17> speed decreased to 8600  Device:St Jude ICD Therapies: on at 200 bpm Last check: 01/2017   BP & Labs:  MAP 101 - doppler was [id number] systolic  Hgb 22.4 - No S/S of bleeding. Specifically denies melena/BRBPR or nosebleeds.  LDH stable at 230 with established baseline of 180- 250. Denies tea-colored urine. No power elevations noted on interrogation.   Plan: 1.Start Entresto 49-51 twice daily. 2. Stop Losartan. 3. Return to clinic in 1 week.   Tanda Rockers RN [place] Coordinator   Office: [phone] 24/7 Emergency VAD Pager: [phone]

## 2017-06-09 NOTE — Progress Notes (Signed)
ADVANCED HF CLINIC NOTE  Patient ID: Brianna Hanson, female   DOB: 03-Apr-1935, 82 y.o.   MRN: [id number] Primary Heart Failure: Brianna Hanson Cardiac Surgeon: Brianna Hanson  HPI: Brianna Hanson is an 82  year old female with a PMH of HF due to severe NICM, chronic systolic HF,  PAF,  plasma cell disorder (Likely IgA MGUS) - followed by Brianna. Alen Hanson (follwoed q 6 months). Underwent implantation of the HeartMate II LVAD on 04/06/13 for DT. She is not on aspirin due to dizziness. S/P lap cholecystitis 10/2015.   GI Issues 09/2016 LGIB. Angio- no source of bleeding. LVAD speed turned down to 8400  LVAD Speed Change 12/2016 Ramp ECHO -->Speed increased from 8400>8800  Today she returns for LVAD follow up. She is here with Brianna Hanson. Continues to feel well. Not taking any diuretics and continues to try and drink a lot of fluids due to previous low flows on VAD. Low flow alarms have nearly resolved. Remains active. No SOB, edema, orthopnea or PND. Weight stable. Compliant with meds. No bleeding, neuro symptoms. No problem with driveline. No fevers or chills.  VAD Indication: Destination Therapy- age excluding  VAD interrogation & Equipment Management: Speed: 8600 Flow: 3.8 Power: 4.2 w PI: 6.7  Alarms: 1 Low flow on 9/27 Events: 20-30 daily Fixed speed 8600 Low speed limit: 8000  Primary Controller: Replace back up battery in 12 months. Back up controller: Replace back up battery in 42months.  Past Medical History:  Diagnosis Date  . AICD (automatic cardioverter/defibrillator) present   . Anemia   . Atrial fibrillation or flutter   . Cardiac arrest - ventricular fibrillation 12/10   with successful resucitation, S/p ICD  . CHF (congestive heart failure) ([place])   . Diverticula of colon 2011  . HTN (hypertension)    moderate  . ICD (implantable cardiac defibrillator) in place    she has received appropriate therapy for VF  . Internal and external hemorrhoids without complication 5093   . Nonischemic cardiomyopathy ([place])    followed by [facility] at [facility]  . Osteopenia   . Plasma cell disorder 03/20/2012  . Plasma cell disorder 03/20/2012  . Sessile colonic polyp 2011   Brianna Hanson    Current Outpatient Prescriptions  Medication Sig Dispense Refill  . amiodarone (PACERONE) 200 MG tablet TAKE 1 TABLET BY MOUTH EVERY DAY 30 tablet 8  . amLODipine (NORVASC) 10 MG tablet Take one tab daily at hour of sleep 30 tablet 6  . carvedilol (COREG) 6.25 MG tablet TAKE 1 TABLET (6.25 MG TOTAL) BY MOUTH 2 (TWO) TIMES DAILY WITH A MEAL. 60 tablet 11  . Cholecalciferol (VITAMIN D3) 1000 UNITS CAPS Take 1 tablet by mouth daily.      [name] docusate sodium (COLACE) 100 MG capsule Take 100 mg by mouth daily.    . ferrous sulfate 325 (65 FE) MG tablet Take 1 tablet (325 mg total) by mouth 2 (two) times daily with a meal. 60 tablet 6  . gabapentin (NEURONTIN) 100 MG capsule Take 1 capsule (100 mg total) by mouth at bedtime. 30 capsule 5  . hydrALAZINE (APRESOLINE) 100 MG tablet Take 1 tablet (100 mg total) by mouth 3 (three) times daily. 360 tablet 6  . pantoprazole (PROTONIX) 40 MG tablet TAKE 1 TABLET BY MOUTH EVERY DAY 30 tablet 6  . potassium chloride SA (KLOR-CON M20) 20 MEQ tablet Take 1 tablet (20 mEq total) by mouth 2 (two) times daily. 60 tablet 11  .  warfarin (COUMADIN) 2.5 MG tablet Take 2.5-5 mg by mouth daily. Take 5 mg (2 tabs) daily except 2.5 mg (1 tab) on Tues, Sat.    . furosemide (LASIX) 20 MG tablet Take 1 tablet (20 mg total) by mouth daily as needed (Only when recommended by CHF clinic.). (Patient not taking: Reported on 06/09/2017) 30 tablet 6  . sacubitril-valsartan (ENTRESTO) 49-51 MG Take 1 tablet by mouth 2 (two) times daily. 60 tablet 6  . traMADol (ULTRAM) 50 MG tablet Take 2 tablets (100 mg total) by mouth every 12 (twelve) hours as needed for moderate pain. 60 tablet 3   No current facility-administered medications for this encounter.     Patient has no known  allergies.  REVIEW OF SYSTEMS: All systems negative except as listed in HPI, PMH and Problem list.  Vitals:   06/09/17 1015 06/09/17 1019  BP: 121/90 (!) 118/0  Pulse: 77   SpO2: 97%     Vital Signs:  Doppler Pressure: 118             Automatc BP: 121/90 (101) HR: 77 SPO2:97  %  Weight: 177.8 lb w/o eqt Last weight: 179.4 lb Home weights: 170-175 lbs     Physical Exam: General:  NAD.  HEENT: normal  Neck: supple. JVP not elevated.  Carotids 2+ bilat; no bruits. No lymphadenopathy or thryomegaly appreciated. Cor: LVAD hum.  Lungs: Clear. Abdomen: obese soft, nontender, non-distended. No hepatosplenomegaly. No bruits or masses. Good bowel sounds. Driveline site clean. Anchor in place.  Extremities: no cyanosis, clubbing, rash. Warm no edema  Neuro: alert & oriented x 3. No focal deficits. Moves all 4 without problem    ASSESSMENT AND PLAN: 1) Chronic systolic HF: NICM, s/p ICD and LVAD for DT(04/2013).  - Stable NYHA II.  - Volume status stable off lasix.  2) LVAD placed for DT 04/2013:  - Recently VAD speed turned down 8600 due to low flow alarms. On ly 1 recent low flow alarm .Cotninues to drink lots of fluids.  .  - BP up despite recent increase of amlodipine. - Will switch losartan to Entresto 49/51. Watch closely so that diuretic effect does not increase low flow alarms.  - No asa with GI bleed and intolerance.  - Continue coumadin. No bleeding problems. - LDH 230 - INR 2.29. Discussed with PharmD personally.  3) PAF:  - Remains in NSR. Follow TSH and LFTs.  Needs yearly eye exams.   Consider stopping at the next visit.   4) HTN:  - Elevated. Switching losartan to Entresto as above 5) Chronic Anticoagulation:  -Goal INR 1.8-2.3 INR 2.29. Continue coumadin. Discussed with Pharm D personally in clinic today.  Total time spent 35 minutes. Over half that time spent discussing above.   Glori Bickers MD 10:53 AM

## 2017-06-10 DIAGNOSIS — Z7901 Long term (current) use of anticoagulants: Secondary | ICD-10-CM | POA: Diagnosis not present

## 2017-06-11 ENCOUNTER — Telehealth (HOSPITAL_COMMUNITY): Payer: Self-pay | Admitting: *Deleted

## 2017-06-11 MED ORDER — LOSARTAN POTASSIUM 50 MG PO TABS: 100.0000 mg | ORAL_TABLET | Freq: Every day | ORAL | 0 refills | Status: DC

## 2017-06-11 NOTE — Telephone Encounter (Signed)
Received call from patient stating she has had multiple low flow alarms starting yesterday morning. Dr Haroldine Laws instructed to stop entresto and restart Losartan at previous dose. Instructed to push fluids and increase salt intake today.   Balinda Quails RN, VAD Coordinator 24/7 pager [phone]

## 2017-06-12 ENCOUNTER — Encounter: Payer: Self-pay | Admitting: Internal Medicine

## 2017-06-16 ENCOUNTER — Encounter (HOSPITAL_COMMUNITY): Payer: Self-pay

## 2017-06-16 ENCOUNTER — Other Ambulatory Visit (HOSPITAL_COMMUNITY): Payer: Self-pay | Admitting: *Deleted

## 2017-06-16 ENCOUNTER — Ambulatory Visit (HOSPITAL_COMMUNITY): Payer: Self-pay | Admitting: Pharmacist

## 2017-06-16 ENCOUNTER — Ambulatory Visit (HOSPITAL_COMMUNITY)
Admission: RE | Admit: 2017-06-16 | Discharge: 2017-06-16 | Disposition: A | Discharge status: Home / Self Care | Payer: Medicare Other | Source: Ambulatory Visit | Attending: Cardiology | Admitting: Cardiology

## 2017-06-16 VITALS — BP 118/63 | Resp 16 | Ht 60.0 in | Wt 176.0 lb

## 2017-06-16 DIAGNOSIS — M8580 Other specified disorders of bone density and structure, unspecified site: Secondary | ICD-10-CM | POA: Insufficient documentation

## 2017-06-16 DIAGNOSIS — Z79899 Other long term (current) drug therapy: Secondary | ICD-10-CM | POA: Diagnosis not present

## 2017-06-16 DIAGNOSIS — I110 Hypertensive heart disease with heart failure: Secondary | ICD-10-CM | POA: Insufficient documentation

## 2017-06-16 DIAGNOSIS — I480 Paroxysmal atrial fibrillation: Secondary | ICD-10-CM | POA: Diagnosis not present

## 2017-06-16 DIAGNOSIS — Z95811 Presence of heart assist device: Secondary | ICD-10-CM | POA: Diagnosis not present

## 2017-06-16 DIAGNOSIS — Z7901 Long term (current) use of anticoagulants: Secondary | ICD-10-CM | POA: Insufficient documentation

## 2017-06-16 DIAGNOSIS — Z86010 Personal history of colonic polyps: Secondary | ICD-10-CM | POA: Insufficient documentation

## 2017-06-16 DIAGNOSIS — Z95810 Presence of automatic (implantable) cardiac defibrillator: Secondary | ICD-10-CM | POA: Insufficient documentation

## 2017-06-16 DIAGNOSIS — I5022 Chronic systolic (congestive) heart failure: Secondary | ICD-10-CM | POA: Diagnosis not present

## 2017-06-16 DIAGNOSIS — Z9889 Other specified postprocedural states: Secondary | ICD-10-CM | POA: Insufficient documentation

## 2017-06-16 DIAGNOSIS — I10 Essential (primary) hypertension: Secondary | ICD-10-CM

## 2017-06-16 DIAGNOSIS — Z8674 Personal history of sudden cardiac arrest: Secondary | ICD-10-CM | POA: Insufficient documentation

## 2017-06-16 DIAGNOSIS — Z7902 Long term (current) use of antithrombotics/antiplatelets: Secondary | ICD-10-CM | POA: Insufficient documentation

## 2017-06-16 DIAGNOSIS — I429 Cardiomyopathy, unspecified: Secondary | ICD-10-CM | POA: Insufficient documentation

## 2017-06-16 LAB — CBC
HCT: 39.2 % (ref 36.0–46.0)
Hemoglobin: 12.5 g/dL (ref 12.0–15.0)
MCH: 29.8 pg (ref 26.0–34.0)
MCHC: 31.9 g/dL (ref 30.0–36.0)
MCV: 93.3 fL (ref 78.0–100.0)
Platelets: 205 10*3/uL (ref 150–400)
RBC: 4.2 MIL/uL (ref 3.87–5.11)
RDW: 16.1 % — ABNORMAL HIGH (ref 11.5–15.5)
WBC: 5.5 10*3/uL (ref 4.0–10.5)

## 2017-06-16 LAB — BASIC METABOLIC PANEL
Anion gap: 6 (ref 5–15)
BUN: 11 mg/dL (ref 6–20)
CO2: 22 mmol/L (ref 22–32)
Calcium: 9 mg/dL (ref 8.9–10.3)
Chloride: 109 mmol/L (ref 101–111)
Creatinine, Ser: 1.18 mg/dL — ABNORMAL HIGH (ref 0.44–1.00)
GFR calc Af Amer: 49 mL/min — ABNORMAL LOW (ref >60)
GFR calc non Af Amer: 42 mL/min — ABNORMAL LOW (ref >60)
Glucose, Bld: 94 mg/dL (ref 65–99)
Potassium: 4.3 mmol/L (ref 3.5–5.1)
Sodium: 137 mmol/L (ref 135–145)

## 2017-06-16 LAB — PROTIME-INR
INR: 2.29
Prothrombin Time: 25 seconds — ABNORMAL HIGH (ref 11.4–15.2)

## 2017-06-16 LAB — LACTATE DEHYDROGENASE: LDH: 210 U/L — ABNORMAL HIGH (ref 98–192)

## 2017-06-16 NOTE — Patient Instructions (Signed)
Return to clinic in 1 month.  You look fabulous!!!! Enjoy those chips!

## 2017-06-16 NOTE — Progress Notes (Signed)
Patient presents for 2 week  follow up in [facility] today. Reports no problems with VAD equipment or concerns with drive line.  Vital Signs:  Doppler Pressure 104   Automatc BP: 118/63 (88) HR:74   SPO2:96  %  Weight: 176 lb w/o eqt Last weight: 178 lb Home weights: 170-175 lbs   VAD Indication: Destination Therapy- age excluding and patient choice    VAD interrogation & Equipment Management: Speed:8600 Flow: 3.5 Power:4.2 w    PI:6.6  Alarms: no clinical alarms Events: 10/14>30 low flow alarms, 10/13> 37 low flow alarms, 10/12> 13 low flow alarms  Fixed speed 8600 Low speed limit: 8000  Exit Site Care: Drive line is being maintained weekly  by [employer]. Drive line exit site well healed and incorporated. The velour is fully implanted at exit site. Dressing dry and intact. No erythema or drainage. Stabilization device present and accurately applied. Pt denies fever or chills. Pt states they have adequate dressing supplies at home.   Significant Events on VAD Support:  09/21/16 GIB, Ramp echo speed decreased to 8400  01/08/17 - LOW FLOW alarms; speed increased to 8800 RPM 05/20/17> speed decreased to 8600  Device:St Jude ICD Therapies: on at 200 bpm Last check: 01/2017   BP & Labs:  MAP 104 - Doppler is reflecting MAP  Hgb 12.5 - No S/S of bleeding. Specifically denies melena/BRBPR or nosebleeds.  Plan: 1. RTC in 2 weeks for INR 2. RTC in 1 month for VAD visit 3. Continue current  Medication regimen  Balinda Quails RN [place] Coordinator   Office: [phone] 24/7 Emergency VAD Pager: [phone]

## 2017-06-16 NOTE — Progress Notes (Signed)
ADVANCED HF CLINIC NOTE  Patient ID: DELANDA BULLUCK, female   DOB: [birth year], 82 y.o.   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 [name] (follwoed q 6 months). Underwent implantation of the HeartMate II LVAD on 04/06/13 for DT. She is not on aspirin due to dizziness. S/P lap cholecystitis 10/2015.   GI Issues 09/2016 LGIB. Angio- no source of bleeding. LVAD speed turned down to 8400  LVAD Speed Change 12/2016 Ramp ECHO -->Speed increased from 8400>8800  Today she returns for LVAD follow up. She is here with [name]. I saw her 1 week ago and MAPs markedly elevated. She had losartan 100 mg per day on her chart but was apparently only taking 50mg  daily. We switched to [facility] and she developed persistent low flow alarms in the setting of volume depletion. We stopped Entresto and hydrated her. We switched to losartan 100 daily. Now feels much better. Low flow alarms have resolved but still with some PI events. No dizziness. No orthopnea, PND or edema. Weight stable. Doing ADLs without problem. Drinking lots of fluids. No bleeding, neuro symptoms. No problem with driveline. No fevers or chills.  VAD Indication: Destination Therapy- age excluding and patient choice    VAD interrogation & Equipment Management: Speed:8600 Flow: 3.5 Power:4.2 w PI:6.6  Alarms: no clinical alarms Events: 10/14>30 low flow alarms, 10/13> 37 low flow alarms, 10/12> 13 low flow alarms  Fixed speed 8600 Low speed limit: 8000 Past Medical History:  Diagnosis Date  . AICD (automatic cardioverter/defibrillator) present   . Anemia   . Atrial fibrillation or flutter   . Cardiac arrest - ventricular fibrillation 12/10   with successful resucitation, S/p ICD  . CHF (congestive heart failure) ([place])   .  Diverticula of colon 2011  . HTN (hypertension)    moderate  . ICD (implantable cardiac defibrillator) in place    she has received appropriate therapy for VF  . Internal and external hemorrhoids without complication 5465  . Nonischemic cardiomyopathy ([place])    [facility] Dr [facility] at [facility]  . Osteopenia   . Plasma cell disorder 03/20/2012  . Plasma cell disorder 03/20/2012  . Sessile colonic polyp 2011   Dr Benson [place]    Current Outpatient Prescriptions  Medication Sig Dispense Refill  . amiodarone (PACERONE) 200 MG tablet TAKE 1 TABLET BY MOUTH EVERY DAY 30 tablet 8  . amLODipine (NORVASC) 10 MG tablet Take one tab daily at hour of sleep 30 tablet 6  . carvedilol (COREG) 6.25 MG tablet TAKE 1 TABLET (6.25 MG TOTAL) BY MOUTH 2 (TWO) TIMES DAILY WITH A MEAL. 60 tablet 11  . Cholecalciferol (VITAMIN D3) 1000 UNITS CAPS Take 1 tablet by mouth daily.      [name] docusate sodium (COLACE) 100 MG capsule Take 100 mg by mouth daily.    . ferrous sulfate 325 (65 FE) MG tablet Take 1 tablet (325 mg total) by mouth 2 (two) times daily with a meal. 60 tablet 6  . gabapentin (NEURONTIN) 100 MG capsule Take 1 capsule (100 mg total) by mouth at bedtime. 30 capsule 5  . hydrALAZINE (APRESOLINE) 100 MG tablet Take 1 tablet (100 mg total) by mouth 3 (three) times daily. 360 tablet 6  . losartan (COZAAR) 50 MG tablet  Take 2 tablets (100 mg total) by mouth daily. 180 tablet 0  . pantoprazole (PROTONIX) 40 MG tablet TAKE 1 TABLET BY MOUTH EVERY DAY 30 tablet 6  . potassium chloride SA (KLOR-CON M20) 20 MEQ tablet Take 1 tablet (20 mEq total) by mouth 2 (two) times daily. 60 tablet 11  . traMADol (ULTRAM) 50 MG tablet Take 2 tablets (100 mg total) by mouth every 12 (twelve) hours as needed for moderate pain. 60 tablet 3  . warfarin (COUMADIN) 2.5 MG tablet Take 2.5-5 mg by mouth daily. Take 5 mg (2 tabs) daily except 2.5 mg (1 tab) on Tues, Sat.    . furosemide (LASIX) 20 MG tablet Take 1 tablet (20 mg total) by mouth  daily as needed (Only when recommended by CHF clinic.). (Patient not taking: Reported on 06/09/2017) 30 tablet 6   No current facility-administered medications for this encounter.     Patient has no known allergies.  REVIEW OF SYSTEMS: All systems negative except as listed in HPI, PMH and Problem list.  Vitals:   06/16/17 1014  BP: 118/63  Resp: 16  SpO2: 96%    Vital Signs:  Doppler Pressure 104              Automatc BP: 118/63 (88) HR:74  SPO2:96  %  Weight: 176 lb w/o eqt Last weight: 178 lb Home weights: 170-175 lbs     Physical Exam: General:  NAD.  HEENT: normal  Neck: supple. JVP not elevated.  Carotids 2+ bilat; no bruits. No lymphadenopathy or thryomegaly appreciated. Cor: LVAD hum.  Lungs: Clear. Abdomen: obese soft, nontender, non-distended. No hepatosplenomegaly. No bruits or masses. Good bowel sounds. Driveline site clean. Anchor in place.  Extremities: no cyanosis, clubbing, rash. Warm no edema  Neuro: alert & oriented x 3. No focal deficits. Moves all 4 without problem     ASSESSMENT AND PLAN: 1) Chronic systolic HF: NICM, s/p ICD and LVAD for DT(04/2013).  - Continues to do well. Stable NYHA II.  - Volume status stable off lasix. Unable to tolerate Entresto to low flow alarms   2) LVAD placed for DT 04/2013:  - Recently VAD speed turned down 8600 due to low flow alarms. - Failed Entresto due to diuresis and incessant low flow alarms.  - Now much improved on losartan 100mg  daily.  - Continue to drink fluids. If having low flow alarms I told her to drink more fluids and eat potato chips.  - No asa with GI bleed and intolerance.  - Continue coumadin. No bleeding problems. - LDH 210 - INR 2.29. Discussed with PharmD personally.  3) PAF:  - Remains in NSR. Follow TSH and LFTs.  Needs yearly eye exams.   Will stop amio at next visit.    4) HTN:  - Improved on current regimen will continue. Unable to tolerate any diuretics.  5) Chronic  Anticoagulation:  -As above. Goal INR 1.8-2.3 INR 2.29. Continue coumadin. Discussed with Pharm D personally in clinic today.  Total time spent 35 minutes. Over half that time spent discussing above.    Glori Bickers MD 11:30 PM

## 2017-06-24 ENCOUNTER — Ambulatory Visit (HOSPITAL_COMMUNITY): Payer: Self-pay | Admitting: Pharmacist

## 2017-06-24 LAB — POCT INR: INR: 2.2

## 2017-06-26 ENCOUNTER — Other Ambulatory Visit (HOSPITAL_COMMUNITY): Payer: Self-pay | Admitting: *Deleted

## 2017-06-26 DIAGNOSIS — Z95811 Presence of heart assist device: Secondary | ICD-10-CM

## 2017-06-29 ENCOUNTER — Other Ambulatory Visit (HOSPITAL_COMMUNITY): Payer: Self-pay | Admitting: Internal Medicine

## 2017-07-01 ENCOUNTER — Ambulatory Visit (HOSPITAL_COMMUNITY): Payer: Self-pay | Admitting: Pharmacist

## 2017-07-01 ENCOUNTER — Ambulatory Visit (HOSPITAL_COMMUNITY)
Admission: RE | Admit: 2017-07-01 | Discharge: 2017-07-01 | Disposition: A | Discharge status: Home / Self Care | Payer: Medicare Other | Source: Ambulatory Visit | Attending: Cardiology | Admitting: Cardiology

## 2017-07-01 DIAGNOSIS — Z95811 Presence of heart assist device: Secondary | ICD-10-CM | POA: Insufficient documentation

## 2017-07-01 LAB — PROTIME-INR
INR: 2.19
Prothrombin Time: 24.1 seconds — ABNORMAL HIGH (ref 11.4–15.2)

## 2017-07-08 ENCOUNTER — Ambulatory Visit (HOSPITAL_COMMUNITY): Payer: Self-pay | Admitting: Pharmacist

## 2017-07-08 LAB — POCT INR: INR: 2.8

## 2017-07-12 ENCOUNTER — Other Ambulatory Visit (HOSPITAL_COMMUNITY): Payer: Self-pay | Admitting: Internal Medicine

## 2017-07-14 ENCOUNTER — Other Ambulatory Visit (HOSPITAL_COMMUNITY): Payer: Self-pay | Admitting: *Deleted

## 2017-07-14 DIAGNOSIS — I5043 Acute on chronic combined systolic (congestive) and diastolic (congestive) heart failure: Secondary | ICD-10-CM

## 2017-07-14 DIAGNOSIS — Z7901 Long term (current) use of anticoagulants: Secondary | ICD-10-CM

## 2017-07-14 DIAGNOSIS — Z95811 Presence of heart assist device: Secondary | ICD-10-CM

## 2017-07-15 ENCOUNTER — Ambulatory Visit (HOSPITAL_COMMUNITY)
Admission: RE | Admit: 2017-07-15 | Discharge: 2017-07-15 | Disposition: A | Discharge status: Home / Self Care | Payer: Medicare Other | Source: Ambulatory Visit | Attending: Cardiology | Admitting: Cardiology

## 2017-07-15 ENCOUNTER — Ambulatory Visit (HOSPITAL_COMMUNITY): Payer: Self-pay | Admitting: Pharmacist

## 2017-07-15 VITALS — BP 98/0 | HR 70 | Ht 60.0 in | Wt 178.4 lb

## 2017-07-15 DIAGNOSIS — Z79899 Other long term (current) drug therapy: Secondary | ICD-10-CM | POA: Diagnosis not present

## 2017-07-15 DIAGNOSIS — I10 Essential (primary) hypertension: Secondary | ICD-10-CM | POA: Diagnosis not present

## 2017-07-15 DIAGNOSIS — I5043 Acute on chronic combined systolic (congestive) and diastolic (congestive) heart failure: Secondary | ICD-10-CM | POA: Diagnosis not present

## 2017-07-15 DIAGNOSIS — Z95811 Presence of heart assist device: Secondary | ICD-10-CM

## 2017-07-15 DIAGNOSIS — Z7901 Long term (current) use of anticoagulants: Secondary | ICD-10-CM

## 2017-07-15 DIAGNOSIS — Z86010 Personal history of colonic polyps: Secondary | ICD-10-CM | POA: Insufficient documentation

## 2017-07-15 DIAGNOSIS — Z8674 Personal history of sudden cardiac arrest: Secondary | ICD-10-CM | POA: Insufficient documentation

## 2017-07-15 DIAGNOSIS — I480 Paroxysmal atrial fibrillation: Secondary | ICD-10-CM | POA: Diagnosis not present

## 2017-07-15 DIAGNOSIS — I428 Other cardiomyopathies: Secondary | ICD-10-CM | POA: Insufficient documentation

## 2017-07-15 DIAGNOSIS — M8580 Other specified disorders of bone density and structure, unspecified site: Secondary | ICD-10-CM | POA: Diagnosis not present

## 2017-07-15 DIAGNOSIS — I5022 Chronic systolic (congestive) heart failure: Secondary | ICD-10-CM

## 2017-07-15 DIAGNOSIS — I110 Hypertensive heart disease with heart failure: Secondary | ICD-10-CM | POA: Diagnosis not present

## 2017-07-15 DIAGNOSIS — D649 Anemia, unspecified: Secondary | ICD-10-CM | POA: Diagnosis not present

## 2017-07-15 LAB — BASIC METABOLIC PANEL
Anion gap: 6 (ref 5–15)
BUN: 12 mg/dL (ref 6–20)
CO2: 23 mmol/L (ref 22–32)
Calcium: 8.8 mg/dL — ABNORMAL LOW (ref 8.9–10.3)
Chloride: 106 mmol/L (ref 101–111)
Creatinine, Ser: 1.06 mg/dL — ABNORMAL HIGH (ref 0.44–1.00)
GFR calc Af Amer: 56 mL/min — ABNORMAL LOW (ref >60)
GFR calc non Af Amer: 48 mL/min — ABNORMAL LOW (ref >60)
Glucose, Bld: 86 mg/dL (ref 65–99)
Potassium: 4.4 mmol/L (ref 3.5–5.1)
Sodium: 135 mmol/L (ref 135–145)

## 2017-07-15 LAB — CBC
HCT: 38 % (ref 36.0–46.0)
Hemoglobin: 12.2 g/dL (ref 12.0–15.0)
MCH: 30.3 pg (ref 26.0–34.0)
MCHC: 32.1 g/dL (ref 30.0–36.0)
MCV: 94.3 fL (ref 78.0–100.0)
Platelets: 218 10*3/uL (ref 150–400)
RBC: 4.03 MIL/uL (ref 3.87–5.11)
RDW: 15.4 % (ref 11.5–15.5)
WBC: 5.1 10*3/uL (ref 4.0–10.5)

## 2017-07-15 LAB — PROTIME-INR
INR: 2.21
Prothrombin Time: 24.3 seconds — ABNORMAL HIGH (ref 11.4–15.2)

## 2017-07-15 LAB — LACTATE DEHYDROGENASE: LDH: 197 U/L — ABNORMAL HIGH (ref 98–192)

## 2017-07-15 MED ORDER — AMIODARONE HCL 200 MG PO TABS: ORAL_TABLET | ORAL | 8 refills | Status: DC

## 2017-07-15 NOTE — Patient Instructions (Signed)
You look great today!   See you in 2 months!

## 2017-07-15 NOTE — Progress Notes (Signed)
Patient presents for 1 month  follow up in [facility] today. Reports no problems with VAD equipment or concerns with drive line.  Vital Signs:  Doppler Pressure: 98  Automatc BP: 98/68(79) HR:70   SPO2:96  %  Weight: 178.4 lb w/o eqt Last weight: 176 lb Home weights: 170-175 lbs   VAD Indication: Destination Therapy- age excluding and patient choice    VAD interrogation & Equipment Management: Speed:8600 Flow: 3.9 Power:4.2 w    PI:6.9  Alarms: 15 low flow on 11/8 and then 6 low flows on random days/times Events: 40-60 PI daily  Fixed speed 8600 Low speed limit: 8000  Exit Site Care: Drive line is being maintained weekly  by [employer]. Drive line exit site well healed and incorporated. The velour is fully implanted at exit site. Dressing dry and intact. No erythema or drainage. Stabilization device present and accurately applied. Pt denies fever or chills. Pt states they have adequate dressing supplies at home.   Significant Events on VAD Support:  09/21/16 GIB, Ramp echo speed decreased to 8400  01/08/17 - LOW FLOW alarms; speed increased to 8800 RPM 05/20/17> speed decreased to 8600  Device:St Jude ICD Therapies: on at 200 bpm Last check: 01/2017   BP & Labs:  MAP 79 - Doppler is reflecting Modified systolic  Hgb 37.1 - No S/S of bleeding. Specifically denies melena/BRBPR or nosebleeds.  Plan: 1. No change in medications. 2. Return to clinic in 2 months.    Tanda Rockers RN [place] Coordinator   Office: [phone] 24/7 Emergency VAD Pager: [phone]

## 2017-07-20 NOTE — Progress Notes (Signed)
ADVANCED HF CLINIC NOTE  Patient ID: Brianna Hanson, female   DOB: [birth year], 82 y.o.   MRN: [id number] Primary Heart Failure: Dr Haroldine Laws Cardiac Surgeon: Dr Prescott Gum  HPI: Ms Kentner is an 82  year old female with a PMH of HF due to severe NICM, chronic systolic HF,  PAF,  plasma cell disorder (Likely IgA MGUS) - followed by Dr. [name] (follwoed q 6 months). Underwent implantation of the HeartMate II LVAD on 04/06/13 for DT. She is not on aspirin due to dizziness. S/P lap cholecystitis 10/2015.   GI Issues 09/2016 LGIB. Angio- no source of bleeding. LVAD speed turned down to 8400  LVAD Speed Change 12/2016 Ramp ECHO -->Speed increased from 8400>8800  She is here for routine f/u visit. Recently struggling with hypotension and low flow alarms but Entresto stopped and losartan started. Much improved still with frequent PI events but very rare low flow alarms. Does all her activities without problems. [name] doing dressing changes. Denies orthopnea or PND. No fevers, chills or problems with driveline. No bleeding, melena or neuro symptoms. No VAD alarms. Taking all meds as prescribed.    VAD Indication: Destination Therapy- age excluding and patient choice    VAD interrogation & Equipment Management: Speed:8600 Flow: 3.9 Power:4.2 w PI:6.9  Alarms: 15 low flow on 11/8 and then 6 low flows on random days/times Events: 40-60 PI daily  Fixed speed 8600 Low speed limit: 8000  Past Medical History:  Diagnosis Date  . AICD (automatic cardioverter/defibrillator) present   . Anemia   . Atrial fibrillation or flutter   . Cardiac arrest - ventricular fibrillation 12/10   with successful resucitation, S/p ICD  . CHF (congestive heart failure) ([place])   . Diverticula of colon 2011  . HTN (hypertension)    moderate  . ICD (implantable cardiac defibrillator) in place    she has received appropriate therapy for VF  . Internal and external hemorrhoids without complication 2585  .  Nonischemic cardiomyopathy ([place])    [facility] Dr [facility]  . Osteopenia   . Plasma cell disorder 03/20/2012  . Plasma cell disorder 03/20/2012  . Sessile colonic polyp 2011   Dr Benson [place]    Current Outpatient Medications  Medication Sig Dispense Refill  . amiodarone (PACERONE) 200 MG tablet take 1/2 tablet daily (100 mg) 30 tablet 8  . amLODipine (NORVASC) 10 MG tablet Take one tab daily at hour of sleep 30 tablet 6  . carvedilol (COREG) 6.25 MG tablet TAKE 1 TABLET (6.25 MG TOTAL) BY MOUTH 2 (TWO) TIMES DAILY WITH A MEAL. 60 tablet 11  . Cholecalciferol (VITAMIN D3) 1000 UNITS CAPS Take 1 tablet by mouth daily.      [name] docusate sodium (COLACE) 100 MG capsule Take 100 mg by mouth daily.    . ferrous sulfate 325 (65 FE) MG tablet Take 1 tablet (325 mg total) by mouth 2 (two) times daily with a meal. 60 tablet 6  . gabapentin (NEURONTIN) 100 MG capsule TAKE ONE CAPSULE BY MOUTH AT BEDTIME 30 capsule 5  . hydrALAZINE (APRESOLINE) 100 MG tablet Take 1 tablet (100 mg total) by mouth 3 (three) times daily. 360 tablet 6  . losartan (COZAAR) 50 MG tablet Take 2 tablets (100 mg total) by mouth daily. 180 tablet 0  . pantoprazole (PROTONIX) 40 MG tablet TAKE 1 TABLET BY MOUTH EVERY DAY 30 tablet 6  . potassium chloride SA (KLOR-CON M20) 20 MEQ tablet Take 1 tablet (20 mEq total)  by mouth 2 (two) times daily. 60 tablet 11  . traMADol (ULTRAM) 50 MG tablet Take 2 tablets (100 mg total) by mouth every 12 (twelve) hours as needed for moderate pain. 60 tablet 3  . furosemide (LASIX) 20 MG tablet Take 1 tablet (20 mg total) by mouth daily as needed (Only when recommended by CHF clinic.). (Patient not taking: Reported on 06/09/2017) 30 tablet 6  . warfarin (COUMADIN) 2.5 MG tablet Take 5 mg (2 tablets) daily except 2.5 mg (1 tablet) on Tuesday and Saturday 48 tablet 6   No current facility-administered medications for this encounter.     Patient has no known allergies.  REVIEW OF SYSTEMS: All  systems negative except as listed in HPI, PMH and Problem list.  Vitals:   07/15/17 1011 07/15/17 1012  BP: 98/68 (!) 98/0  Pulse: 70   SpO2: 96%     Vital Signs:  Doppler Pressure: 98   Automatc BP: 98/68(79) HR:70  SPO2:96  %  Weight: 178.4 lb w/o eqt Last weight: 176 lb Home weights: 170-175 lbs     Physical Exam: General:  NAD.  HEENT: normal  Neck: supple. JVP not elevated.  Carotids 2+ bilat; no bruits. No lymphadenopathy or thryomegaly appreciated. Cor: LVAD hum.  Lungs: Clear. Abdomen: obese soft, nontender, non-distended. No hepatosplenomegaly. No bruits or masses. Good bowel sounds. Driveline site clean. Anchor in place.  Extremities: no cyanosis, clubbing, rash. Warm no edema  Neuro: alert & oriented x 3. No focal deficits. Moves all 4 without problem    ASSESSMENT AND PLAN:  1) Chronic systolic HF: NICM, s/p ICD and LVAD for DT(04/2013).  - Continues to do well. Stable NYHA II.  - Volume status stable off lasix. Unable to tolerate Entresto to low flow alarms  - Encouraged her to keep hydrated  2) LVAD placed for DT 04/2013:  - Failed Entresto due to diuresis and incessant low flow alarms.  - Now much improved on losartan 100mg  daily.BP much better  - Continue to drink fluids. If having low flow alarms I told her to drink more fluids and eat potato chips.  - No asa with GI bleed and intolerance.  - Continue coumadin. No bleeding problems. - LDH 197 - INR 2.21. Discussed with PharmD personally - VAD interrogated personally. Parameters stable. 3) PAF:  - Remains in NSR. Can stop amio    4) HTN:  - As above, omproved on current regimen. Will continue. Unable to tolerate any diuretics including Entresto 5) Chronic Anticoagulation:  -As above. Goal INR 1.8-2.3 INR 2.21. Continue coumadin. Discussed with Pharm D personally in clinic today.   Glori Bickers MD 6:16 PM

## 2017-07-22 ENCOUNTER — Ambulatory Visit (HOSPITAL_COMMUNITY): Payer: Self-pay | Admitting: Pharmacist

## 2017-07-22 LAB — POCT INR: INR: 2.3

## 2017-07-29 ENCOUNTER — Ambulatory Visit (HOSPITAL_COMMUNITY): Payer: Self-pay | Admitting: Pharmacist

## 2017-07-29 LAB — POCT INR: INR: 2.4

## 2017-08-05 ENCOUNTER — Ambulatory Visit (HOSPITAL_COMMUNITY): Payer: Self-pay | Admitting: Pharmacist

## 2017-08-05 ENCOUNTER — Ambulatory Visit (INDEPENDENT_AMBULATORY_CARE_PROVIDER_SITE_OTHER): Payer: Medicare Other | Admitting: *Deleted

## 2017-08-05 DIAGNOSIS — I429 Cardiomyopathy, unspecified: Secondary | ICD-10-CM | POA: Diagnosis not present

## 2017-08-05 LAB — POCT INR: INR: 2

## 2017-08-05 LAB — CUP PACEART REMOTE DEVICE CHECK
Battery Remaining Longevity: 29 mo
Battery Remaining Percentage: 28 %
Battery Voltage: 2.84 V
Brady Statistic AP VP Percent: 1 %
Brady Statistic AP VS Percent: 9 %
Brady Statistic AS VP Percent: 1 %
Brady Statistic AS VS Percent: 86 %
Brady Statistic RA Percent Paced: 3.8 %
Brady Statistic RV Percent Paced: 1 %
Date Time Interrogation Session: 20181204101635
HighPow Impedance: 46 Ohm
Implantable Lead Implant Date: 20110104
Implantable Lead Implant Date: 20110104
Implantable Lead Location: 753859
Implantable Lead Location: 753860
Implantable Lead Model: 7121
Implantable Pulse Generator Implant Date: 20110104
Lead Channel Impedance Value: 310 Ohm
Lead Channel Impedance Value: 310 Ohm
Lead Channel Pacing Threshold Amplitude: 0.75 V
Lead Channel Pacing Threshold Amplitude: 0.75 V
Lead Channel Pacing Threshold Pulse Width: 0.5 ms
Lead Channel Pacing Threshold Pulse Width: 0.5 ms
Lead Channel Sensing Intrinsic Amplitude: 1 mV
Lead Channel Sensing Intrinsic Amplitude: 11.2 mV
Lead Channel Setting Pacing Amplitude: 2 V
Lead Channel Setting Pacing Amplitude: 2.5 V
Lead Channel Setting Pacing Pulse Width: 0.5 ms
Lead Channel Setting Sensing Sensitivity: 0.5 mV
Pulse Gen Serial Number: 754815

## 2017-08-05 NOTE — Progress Notes (Signed)
Remote ICD transmission.   

## 2017-08-06 ENCOUNTER — Encounter: Payer: Self-pay | Admitting: Cardiology

## 2017-08-12 ENCOUNTER — Ambulatory Visit (HOSPITAL_COMMUNITY): Payer: Self-pay | Admitting: Pharmacist

## 2017-08-12 DIAGNOSIS — Z7901 Long term (current) use of anticoagulants: Secondary | ICD-10-CM | POA: Diagnosis not present

## 2017-08-12 LAB — POCT INR: INR: 2.5

## 2017-08-20 ENCOUNTER — Ambulatory Visit (HOSPITAL_COMMUNITY): Payer: Self-pay | Admitting: Pharmacist

## 2017-08-20 LAB — POCT INR: INR: 2.6

## 2017-08-26 ENCOUNTER — Ambulatory Visit (HOSPITAL_COMMUNITY): Payer: Self-pay | Admitting: *Deleted

## 2017-08-26 LAB — POCT INR: INR: 2.3

## 2017-09-02 LAB — POCT INR: INR: 2.9

## 2017-09-03 ENCOUNTER — Ambulatory Visit (HOSPITAL_COMMUNITY): Payer: Self-pay | Admitting: Pharmacist

## 2017-09-04 NOTE — Progress Notes (Deleted)
Subjective:   Brianna Hanson is a 83 y.o. female who presents for an Initial Medicare Annual Wellness Visit.  Review of Systems    No ROS.  Medicare Wellness Visit. Additional risk factors are reflected in the social history.     Sleep patterns: {SX; SLEEP PATTERNS:18802::"feels rested on waking","does not get up to void","gets up *** times nightly to void","sleeps *** hours nightly"}.    Home Safety/Smoke Alarms: Feels safe in home. Smoke alarms in place.  Living environment; residence and Firearm Safety: {Rehab home environment / accessibility:30080::"no firearms","firearms stored safely"}. Seat Belt Safety/Bike Helmet: Wears seat belt.     Objective:    There were no vitals filed for this visit. There is no height or weight on file to calculate BMI.  Advanced Directives 05/27/2017 05/20/2017 12/09/2016 11/19/2016 10/29/2016 09/21/2016 09/21/2016  Does Patient Have a Medical Advance Directive? No No No No No No No  Would patient like information on creating a medical advance directive? - - - - - No - Patient declined -  Pre-existing out of facility DNR order (yellow form or pink MOST form) - - - - - - -    Current Medications (verified) Outpatient Encounter Medications as of 09/05/2017  Medication Sig  . amiodarone (PACERONE) 200 MG tablet take 1/2 tablet daily (100 mg)  . amLODipine (NORVASC) 10 MG tablet Take one tab daily at hour of sleep  . carvedilol (COREG) 6.25 MG tablet TAKE 1 TABLET (6.25 MG TOTAL) BY MOUTH 2 (TWO) TIMES DAILY WITH A MEAL.  [name] Cholecalciferol (VITAMIN D3) 1000 UNITS CAPS Take 1 tablet by mouth daily.    [name] docusate sodium (COLACE) 100 MG capsule Take 100 mg by mouth daily.  . ferrous sulfate 325 (65 FE) MG tablet Take 1 tablet (325 mg total) by mouth 2 (two) times daily with a meal.  . furosemide (LASIX) 20 MG tablet Take 1 tablet (20 mg total) by mouth daily as needed (Only when recommended by CHF clinic.). (Patient not taking: Reported on 06/09/2017)  .  gabapentin (NEURONTIN) 100 MG capsule TAKE ONE CAPSULE BY MOUTH AT BEDTIME  . hydrALAZINE (APRESOLINE) 100 MG tablet Take 1 tablet (100 mg total) by mouth 3 (three) times daily.  [name] losartan (COZAAR) 50 MG tablet Take 2 tablets (100 mg total) by mouth daily.  . pantoprazole (PROTONIX) 40 MG tablet TAKE 1 TABLET BY MOUTH EVERY DAY  . potassium chloride SA (KLOR-CON M20) 20 MEQ tablet Take 1 tablet (20 mEq total) by mouth 2 (two) times daily.  . traMADol (ULTRAM) 50 MG tablet Take 2 tablets (100 mg total) by mouth every 12 (twelve) hours as needed for moderate pain.  [name] warfarin (COUMADIN) 2.5 MG tablet Take 5 mg (2 tablets) daily except 2.5 mg (1 tablet) on Tuesday and Saturday   No facility-administered encounter medications on file as of 09/05/2017.     Allergies (verified) Patient has no known allergies.   History: Past Medical History:  Diagnosis Date  . AICD (automatic cardioverter/defibrillator) present   . Anemia   . Atrial fibrillation or flutter   . Cardiac arrest - ventricular fibrillation 12/10   with successful resucitation, S/p ICD  . CHF (congestive heart failure) ([place])   . Diverticula of colon 2011  . HTN (hypertension)    moderate  . ICD (implantable cardiac defibrillator) in place    she has received appropriate therapy for VF  . Internal and external hemorrhoids without complication 5638  . Nonischemic cardiomyopathy ([place])  followed by Dr [facility] at [facility]  . Osteopenia   . Plasma cell disorder 03/20/2012  . Plasma cell disorder 03/20/2012  . Sessile colonic polyp 2011   Dr Benson Norway   Past Surgical History:  Procedure Laterality Date  . ABDOMINAL HYSTERECTOMY    . CARDIAC CATHETERIZATION    . CARDIAC DEFIBRILLATOR PLACEMENT     by Greggory Brandy for secondary prevention of sudden death  . CHOLECYSTECTOMY N/A November 03, 2015   Procedure: LAPAROSCOPIC CHOLECYSTECTOMY;  Surgeon: Rolm Bookbinder, MD;  Location: [place];  Service: General;  Laterality: N/A;  . COLONOSCOPY W/ POLYPECTOMY   2011   Dr Benson Norway  . INSERTION OF IMPLANTABLE LEFT VENTRICULAR ASSIST DEVICE N/A 04/06/2013   Procedure: INSERTION OF IMPLANTABLE LEFT VENTRICULAR ASSIST DEVICE;  Surgeon: Gaye Pollack, MD;  Location: [place];  Service: Open Heart Surgery;  Laterality: N/A;  . INTRAOPERATIVE TRANSESOPHAGEAL ECHOCARDIOGRAM N/A 04/06/2013   Procedure: INTRAOPERATIVE TRANSESOPHAGEAL ECHOCARDIOGRAM;  Surgeon: Gaye Pollack, MD;  Location: [place] [facility];  Service: Open Heart Surgery;  Laterality: N/A;  . IR GENERIC HISTORICAL  09/24/2016   IR US GUIDE VASC ACCESS RIGHT 09/24/2016 Greggory Keen, MD [facility]  . IR GENERIC HISTORICAL  09/24/2016   IR ANGIOGRAM VISCERAL SELECTIVE 09/24/2016 Greggory Keen, MD [facility]  . IR GENERIC HISTORICAL  09/24/2016   IR ANGIOGRAM VISCERAL SELECTIVE 09/24/2016 Greggory Keen, MD [facility]  . PLACEMENT OF CENTRIMAG VENTRICULAR ASSIST DEVICE Right 04/06/2013   Procedure: PLACEMENT OF CENTRIMAG VENTRICULAR ASSIST DEVICE;  Surgeon: Gaye Pollack, MD;  Location: [place];  Service: Open Heart Surgery;  Laterality: Right;   Family History  Problem Relation Age of Onset  . Stroke Mother   . Stroke Brother    Social History   Socioeconomic History  . Marital status: Single    Spouse name: Not on file  . Number of children: 3  . Years of education: Not on file  . Highest education level: Not on file  Social Needs  . Financial resource strain: Not on file  . Food insecurity - worry: Not on file  . Food insecurity - inability: Not on file  . Transportation needs - medical: Not on file  . Transportation needs - non-medical: Not on file  Occupational History  . Not on file  Tobacco Use  . Smoking status: Never Smoker  . Smokeless tobacco: Never Used  . Tobacco comment: remote history of tobacco abuse  Substance and Sexual Activity  . Alcohol use: Yes    Comment: occsionally wine  . Drug use: No  . Sexual activity: Not Currently    Birth control/protection: Surgical  Other Topics  Concern  . Not on file  Social History Narrative   Lives with spouse who was recently diagnosed with esophageal ca and is receiving chemotherapy    Tobacco Counseling Counseling given: Not Answered Comment: remote history of tobacco abuse  Activities of Daily Living In your present state of health, do you have any difficulty performing the following activities: 09/21/2016  Hearing? N  Vision? N  Difficulty concentrating or making decisions? N  Walking or climbing stairs? N  Dressing or bathing? N  Doing errands, shopping? N  Some recent data might be hidden     Immunizations and Health Maintenance Immunization History  Administered Date(s) Administered  . Influenza Split 08/02/2012, 06/28/2016  . Influenza Whole 07/02/2007, 09/02/2010, 07/07/2013  . Influenza, High Dose Seasonal PF 06/09/2017  . Influenza,inj,Quad PF,6+ Mos 08/18/2015  . Pneumococcal Polysaccharide-23 07/15/2006, 03/13/2013  . Td  09/03/2001  . Tdap 02/03/2012   Health Maintenance Due  Topic Date Due  . PNA vac Low Risk Adult (2 of 2 - PCV13) 03/13/2014    Patient Care Team: Janith Lima, MD as PCP - General (Internal Medicine)  Indicate any recent Medical Services you may have received from other than [facility] providers in the past year (date may be approximate).     Assessment:   This is a routine wellness examination for [place]. Physical assessment deferred to PCP.   Hearing/Vision screen No exam data present  Dietary issues and exercise activities discussed:   Diet (meal preparation, eat out, water intake, caffeinated beverages, dairy products, fruits and vegetables): {Desc; diets:16563}      Goals    None     Depression Screen PHQ 2/9 Scores 11/19/2016 10/29/2016 07/05/2013 02/03/2013  PHQ - 2 Score 0 0 0 0  Exception Documentation Other- indicate reason in comment box - - -    Fall Risk Fall Risk  11/19/2016 10/29/2016 08/09/2014 02/03/2013  Falls in the past year? No No No No  Risk for  fall due to : Other (Comment) - - -   Cognitive Function:        Screening Tests Health Maintenance  Topic Date Due  . PNA vac Low Risk Adult (2 of 2 - PCV13) 03/13/2014  . TETANUS/TDAP  02/02/2022  . INFLUENZA VACCINE  Completed  . DEXA SCAN  Completed     Plan:      I have personally reviewed and noted the following in the patient's chart:   . Medical and social history . Use of alcohol, tobacco or illicit drugs  . Current medications and supplements . Functional ability and status . Nutritional status . Physical activity . Advanced directives . List of other physicians . Vitals . Screenings to include cognitive, depression, and falls . Referrals and appointments  In addition, I have reviewed and discussed with patient certain preventive protocols, quality metrics, and best practice recommendations. A written personalized care plan for preventive services as well as general preventive health recommendations were provided to patient.     Michiel Cowboy, RN   09/04/2017

## 2017-09-05 ENCOUNTER — Ambulatory Visit: Payer: Medicare Other

## 2017-09-09 ENCOUNTER — Ambulatory Visit (HOSPITAL_COMMUNITY): Payer: Self-pay | Admitting: Pharmacist

## 2017-09-09 ENCOUNTER — Other Ambulatory Visit (HOSPITAL_COMMUNITY): Payer: Self-pay | Admitting: *Deleted

## 2017-09-09 DIAGNOSIS — Z7901 Long term (current) use of anticoagulants: Secondary | ICD-10-CM | POA: Diagnosis not present

## 2017-09-09 LAB — POCT INR: INR: 2.5

## 2017-09-09 MED ORDER — LOSARTAN POTASSIUM 50 MG PO TABS: 100.0000 mg | ORAL_TABLET | Freq: Every day | ORAL | 3 refills | Status: DC

## 2017-09-10 ENCOUNTER — Other Ambulatory Visit (HOSPITAL_COMMUNITY): Payer: Self-pay | Admitting: Pharmacist

## 2017-09-10 MED ORDER — LOSARTAN POTASSIUM 100 MG PO TABS: 100.0000 mg | ORAL_TABLET | Freq: Every day | ORAL | 3 refills | Status: DC

## 2017-09-16 ENCOUNTER — Ambulatory Visit (HOSPITAL_COMMUNITY): Payer: Self-pay | Admitting: Pharmacist

## 2017-09-16 LAB — POCT INR: INR: 2.8

## 2017-09-17 ENCOUNTER — Other Ambulatory Visit (HOSPITAL_COMMUNITY): Payer: Self-pay | Admitting: *Deleted

## 2017-09-17 ENCOUNTER — Encounter (HOSPITAL_COMMUNITY): Payer: Self-pay

## 2017-09-17 ENCOUNTER — Ambulatory Visit (HOSPITAL_COMMUNITY)
Admission: RE | Admit: 2017-09-17 | Discharge: 2017-09-17 | Disposition: A | Discharge status: Home / Self Care | Payer: Medicare Other | Source: Ambulatory Visit | Attending: Internal Medicine | Admitting: Internal Medicine

## 2017-09-17 ENCOUNTER — Ambulatory Visit (HOSPITAL_COMMUNITY): Payer: Self-pay | Admitting: Pharmacist

## 2017-09-17 VITALS — BP 124/0 | HR 77 | Resp 16 | Ht 60.0 in | Wt 180.6 lb

## 2017-09-17 DIAGNOSIS — I429 Cardiomyopathy, unspecified: Secondary | ICD-10-CM | POA: Insufficient documentation

## 2017-09-17 DIAGNOSIS — Z7901 Long term (current) use of anticoagulants: Secondary | ICD-10-CM | POA: Diagnosis not present

## 2017-09-17 DIAGNOSIS — Z95810 Presence of automatic (implantable) cardiac defibrillator: Secondary | ICD-10-CM | POA: Diagnosis not present

## 2017-09-17 DIAGNOSIS — I5022 Chronic systolic (congestive) heart failure: Secondary | ICD-10-CM | POA: Diagnosis not present

## 2017-09-17 DIAGNOSIS — Z86010 Personal history of colonic polyps: Secondary | ICD-10-CM | POA: Insufficient documentation

## 2017-09-17 DIAGNOSIS — Z8674 Personal history of sudden cardiac arrest: Secondary | ICD-10-CM | POA: Diagnosis not present

## 2017-09-17 DIAGNOSIS — Z79899 Other long term (current) drug therapy: Secondary | ICD-10-CM | POA: Insufficient documentation

## 2017-09-17 DIAGNOSIS — Z95811 Presence of heart assist device: Secondary | ICD-10-CM | POA: Diagnosis not present

## 2017-09-17 DIAGNOSIS — I10 Essential (primary) hypertension: Secondary | ICD-10-CM | POA: Diagnosis not present

## 2017-09-17 DIAGNOSIS — I480 Paroxysmal atrial fibrillation: Secondary | ICD-10-CM | POA: Insufficient documentation

## 2017-09-17 DIAGNOSIS — I110 Hypertensive heart disease with heart failure: Secondary | ICD-10-CM | POA: Insufficient documentation

## 2017-09-17 DIAGNOSIS — M8580 Other specified disorders of bone density and structure, unspecified site: Secondary | ICD-10-CM | POA: Diagnosis not present

## 2017-09-17 DIAGNOSIS — Z95818 Presence of other cardiac implants and grafts: Secondary | ICD-10-CM | POA: Diagnosis not present

## 2017-09-17 LAB — BASIC METABOLIC PANEL
Anion gap: 10 (ref 5–15)
BUN: 12 mg/dL (ref 6–20)
CO2: 20 mmol/L — ABNORMAL LOW (ref 22–32)
Calcium: 9.2 mg/dL (ref 8.9–10.3)
Chloride: 110 mmol/L (ref 101–111)
Creatinine, Ser: 1.01 mg/dL — ABNORMAL HIGH (ref 0.44–1.00)
GFR calc Af Amer: 59 mL/min — ABNORMAL LOW (ref >60)
GFR calc non Af Amer: 51 mL/min — ABNORMAL LOW (ref >60)
Glucose, Bld: 94 mg/dL (ref 65–99)
Potassium: 4.3 mmol/L (ref 3.5–5.1)
Sodium: 140 mmol/L (ref 135–145)

## 2017-09-17 LAB — CBC
HCT: 39.3 % (ref 36.0–46.0)
Hemoglobin: 12.3 g/dL (ref 12.0–15.0)
MCH: 29.9 pg (ref 26.0–34.0)
MCHC: 31.3 g/dL (ref 30.0–36.0)
MCV: 95.6 fL (ref 78.0–100.0)
Platelets: 196 10*3/uL (ref 150–400)
RBC: 4.11 MIL/uL (ref 3.87–5.11)
RDW: 16.3 % — ABNORMAL HIGH (ref 11.5–15.5)
WBC: 6.4 10*3/uL (ref 4.0–10.5)

## 2017-09-17 LAB — PROTIME-INR
INR: 2.38
Prothrombin Time: 25.8 seconds — ABNORMAL HIGH (ref 11.4–15.2)

## 2017-09-17 LAB — LACTATE DEHYDROGENASE: LDH: 236 U/L — ABNORMAL HIGH (ref 98–192)

## 2017-09-17 NOTE — Patient Instructions (Signed)
1. Stop Amiodarone 2. Return to [facility] in 2 months. Will perform 4.5 yr Intermacs f/u with 6 min walk.

## 2017-09-17 NOTE — Progress Notes (Signed)
Patient presents for 2 month  follow up in [facility] today. Reports no problems with VAD equipment or concerns with drive line.  Pt does admit to a "few" low flow alarms. When these occur, she "drinks water" and they resolve very quickly. She denies any symptoms of weakness, dizziness, palpitations, or syncope during the alarms.   Vital Signs:  Doppler Pressure:  124 Automatc BP:  118/70 (86) HR:  77 SPO2:  96%  Weight: 180.6 lb w/o eqt Last weight: 178.4 lbs   VAD Indication: Destination Therapy - age excluding and patient choice    VAD interrogation & Equipment Management: Speed:8600 Flow: 4.2 Power: 4.5 w    PI: 6.7  Alarms: 1 - 3 low flows on two days Events: 20 - 80 PI daily  Fixed speed 8600 Low speed limit: 8000  Exit Site Care: Drive line is being maintained weekly by [employer]. Drive line exit site well healed and incorporated. The velour is fully implanted at exit site. Dressing dry and intact. No erythema or drainage. Stabilization device present and accurately applied. Pt denies fever or chills. Gave pt 9 dressing kits for home use.   Significant Events on VAD Support:  09/21/16 GIB, Ramp echo speed decreased to 8400  01/08/17 - LOW FLOW alarms; speed increased to 8800 RPM 05/20/17> speed decreased to 8600  Device:St Jude ICD Therapies: on at 200 bpm Last check: 08/2017  BP & Labs: Doppler BP - 124 is reflecting Modified systolic  Hgb 09.2 - No S/S of bleeding. Specifically denies melena/BRBPR or nosebleeds.  Patient Instructions: 1. Stop Amiodarone 2. Return to [facility] in 2 months. Will perform 4.5 yr Intermacs f/u with 6 min walk.  Zada Girt RN [place] Coordinator   Office: [phone] 24/7 Emergency VAD Pager: [phone]

## 2017-09-20 NOTE — Progress Notes (Signed)
ADVANCED HF CLINIC NOTE  Patient ID: Brianna Hanson, female   DOB: [birth year], 83 y.o.   MRN: [id number] Primary Heart Failure: Brianna Brianna Hanson Cardiac Surgeon: Brianna Brianna Hanson  HPI: Brianna Hanson is an 83  year old female with a PMH of HF due to severe NICM, chronic systolic HF,  PAF,  plasma cell disorder (Likely IgA MGUS) - followed by Brianna. Alen Hanson (follwoed q 6 months). Underwent implantation of the HeartMate II LVAD on 04/06/13 for DT. She is not on aspirin due to dizziness. S/P lap cholecystitis 10/2015.   GI Issues 09/2016 LGIB. Angio- no source of bleeding. LVAD speed turned down to 8400  LVAD Speed Change 12/2016 Ramp ECHO -->Speed increased from 8400>8800  She is here for routine f/u visit. Continues to do very well. Off all diuretics. Occasionally gets low flow alarms and then drinks water and eats potato chips and they improve. Able to do all ADLs without problem. No edema Denies orthopnea or PND. No fevers, chills or problems with driveline. No bleeding, melena or neuro symptoms. No VAD alarms. Taking all meds as prescribed.  VAD Indication: Destination Therapy - age excluding and patient choice    VAD interrogation & Equipment Management: Speed:8600 Flow: 4.2 Power: 4.5 w PI: 6.7  Alarms: 1 - 3 low flows on two days Events: 20 - 80 PI daily  Fixed speed 8600 Low speed limit: 8000   Past Medical History:  Diagnosis Date  . AICD (automatic cardioverter/defibrillator) present   . Anemia   . Atrial fibrillation or flutter   . Cardiac arrest - ventricular fibrillation 12/10   with successful resucitation, S/p ICD  . CHF (congestive heart failure) (Brianna Hanson)   . Diverticula of colon 2011  . HTN (hypertension)    moderate  . ICD (implantable cardiac defibrillator) in place    she has received appropriate therapy for VF  . Internal and external hemorrhoids without complication 1941  . Nonischemic cardiomyopathy (Brianna Hanson)    followed by Brianna [facility] at [facility]  . Osteopenia   .  Plasma cell disorder 03/20/2012  . Plasma cell disorder 03/20/2012  . Sessile colonic polyp 2011   Brianna Hanson    Current Outpatient Medications  Medication Sig Dispense Refill  . amiodarone (PACERONE) 200 MG tablet take 1/2 tablet daily (100 mg) 30 tablet 8  . amLODipine (NORVASC) 10 MG tablet Take one tab daily at hour of sleep 30 tablet 6  . carvedilol (COREG) 6.25 MG tablet TAKE 1 TABLET (6.25 MG TOTAL) BY MOUTH 2 (TWO) TIMES DAILY WITH A MEAL. 60 tablet 11  . Cholecalciferol (VITAMIN D3) 1000 UNITS CAPS Take 1 tablet by mouth daily.      [name] docusate sodium (COLACE) 100 MG capsule Take 100 mg by mouth daily.    . ferrous sulfate 325 (65 FE) MG tablet Take 1 tablet (325 mg total) by mouth 2 (two) times daily with a meal. 60 tablet 6  . gabapentin (NEURONTIN) 100 MG capsule TAKE ONE CAPSULE BY MOUTH AT BEDTIME 30 capsule 5  . hydrALAZINE (APRESOLINE) 100 MG tablet Take 1 tablet (100 mg total) by mouth 3 (three) times daily. 360 tablet 6  . losartan (COZAAR) 100 MG tablet Take 1 tablet (100 mg total) by mouth daily. 180 tablet 3  . pantoprazole (PROTONIX) 40 MG tablet TAKE 1 TABLET BY MOUTH EVERY DAY 30 tablet 6  . potassium chloride SA (KLOR-CON M20) 20 MEQ tablet Take 1 tablet (20 mEq total) by mouth 2 (two) times  daily. 60 tablet 11  . traMADol (ULTRAM) 50 MG tablet Take 2 tablets (100 mg total) by mouth every 12 (twelve) hours as needed for moderate pain. 60 tablet 3  . warfarin (COUMADIN) 2.5 MG tablet Take 5 mg (2 tablets) daily except 2.5 mg (1 tablet) on Tuesday and Saturday 48 tablet 6  . furosemide (LASIX) 20 MG tablet Take 1 tablet (20 mg total) by mouth daily as needed (Only when recommended by CHF clinic.). (Patient not taking: Reported on 06/09/2017) 30 tablet 6   No current facility-administered medications for this encounter.     Patient has no known allergies.  REVIEW OF SYSTEMS: All systems negative except as listed in HPI, PMH and Problem list.  Vitals:   09/17/17 1018  09/17/17 1450  BP: 118/70 (!) 124/0  Pulse: 77   Resp: 16   SpO2: 96%     Vital Signs:  Doppler Pressure:  124 Automatc BP:  118/70 (86) HR:  77 SPO2:  96%  Weight: 180.6 lb w/o eqt Last weight: 178.4 lbs     Physical Exam: General:  NAD. Looks younger than stated age. Here with Brianna Hanson HEENT: normal  Neck: supple. JVP not elevated.  Carotids 2+ bilat; no bruits. No lymphadenopathy or thryomegaly appreciated. Cor: LVAD hum.  Lungs: Clear. Abdomen: obese soft, nontender, non-distended. No hepatosplenomegaly. No bruits or masses. Good bowel sounds. Driveline site clean. Anchor in place.  Extremities: no cyanosis, clubbing, rash. Warm no edema  Neuro: alert & oriented x 3. No focal deficits. Moves all 4 without problem     ASSESSMENT AND PLAN:  1) Chronic systolic HF: NICM, s/p ICD and LVAD for DT(04/2013).  - Continues to do very well with VAD support. NYHA I-II - Volume status stable off lasix. Unable to tolerate Entresto to low flow alarms  - Continues with occasional low flows but improved with fluid and salt. Can add florinef or midodrine as needed  2) LVAD placed for DT 04/2013:  - Failed Entresto due to diuresis and incessant low flow alarms.  - Now much improved on losartan 100mg  daily.BP much better  - No asa with GI bleed and intolerance.  - Continue coumadin. No bleeding problems. - LDH 236 - INR 2.38. Discussed with Brianna Hanson personally - VAD interrogated personally. Parameters stable. 3) PAF:  - Remains in NSR. Stop amio 4) HTN:  - As above, blood pressure well controlled. Continue current regimen.Unable to tolerate any diuretics including Entresto 5) Chronic Anticoagulation:  -As above. Goal INR 1.8-2.3 INR 2381. Continue coumadin. Discussed with Pharm D personally in clinic today.   Brianna Bickers MD 9:31 PM

## 2017-09-24 ENCOUNTER — Ambulatory Visit (HOSPITAL_COMMUNITY): Payer: Self-pay | Admitting: Pharmacist

## 2017-09-24 LAB — POCT INR: INR: 2.2

## 2017-09-30 ENCOUNTER — Ambulatory Visit (HOSPITAL_COMMUNITY): Payer: Self-pay | Admitting: Pharmacist

## 2017-09-30 LAB — POCT INR: INR: 1.9

## 2017-10-07 ENCOUNTER — Ambulatory Visit (HOSPITAL_COMMUNITY): Payer: Self-pay | Admitting: Pharmacist

## 2017-10-07 DIAGNOSIS — Z7901 Long term (current) use of anticoagulants: Secondary | ICD-10-CM | POA: Diagnosis not present

## 2017-10-07 LAB — POCT INR: INR: 1.9

## 2017-10-14 ENCOUNTER — Ambulatory Visit (HOSPITAL_COMMUNITY): Payer: Self-pay | Admitting: Pharmacist

## 2017-10-14 LAB — POCT INR: INR: 2.3

## 2017-10-21 ENCOUNTER — Ambulatory Visit (HOSPITAL_COMMUNITY): Payer: Self-pay | Admitting: Pharmacist

## 2017-10-21 LAB — POCT INR: INR: 2.1

## 2017-10-28 ENCOUNTER — Ambulatory Visit (HOSPITAL_COMMUNITY): Payer: Self-pay | Admitting: Pharmacist

## 2017-10-28 LAB — POCT INR: INR: 2

## 2017-11-02 ENCOUNTER — Other Ambulatory Visit: Payer: Self-pay | Admitting: Internal Medicine

## 2017-11-04 ENCOUNTER — Ambulatory Visit (HOSPITAL_COMMUNITY): Payer: Self-pay | Admitting: Pharmacist

## 2017-11-04 ENCOUNTER — Ambulatory Visit (INDEPENDENT_AMBULATORY_CARE_PROVIDER_SITE_OTHER): Payer: Medicare Other | Admitting: *Deleted

## 2017-11-04 DIAGNOSIS — I429 Cardiomyopathy, unspecified: Secondary | ICD-10-CM | POA: Diagnosis not present

## 2017-11-04 DIAGNOSIS — Z7901 Long term (current) use of anticoagulants: Secondary | ICD-10-CM | POA: Diagnosis not present

## 2017-11-04 LAB — POCT INR: INR: 2.4

## 2017-11-04 NOTE — Progress Notes (Signed)
Remote ICD transmission.   

## 2017-11-05 ENCOUNTER — Encounter: Payer: Self-pay | Admitting: Cardiology

## 2017-11-06 LAB — CUP PACEART REMOTE DEVICE CHECK
Date Time Interrogation Session: 20190307105029
Implantable Lead Implant Date: 20110104
Implantable Lead Implant Date: 20110104
Implantable Lead Location: 753859
Implantable Lead Location: 753860
Implantable Lead Model: 7121
Implantable Pulse Generator Implant Date: 20110104
Pulse Gen Serial Number: 754815

## 2017-11-11 ENCOUNTER — Ambulatory Visit (HOSPITAL_COMMUNITY): Payer: Self-pay | Admitting: Pharmacist

## 2017-11-11 LAB — POCT INR: INR: 2.3

## 2017-11-14 ENCOUNTER — Other Ambulatory Visit (HOSPITAL_COMMUNITY): Payer: Self-pay | Admitting: Unknown Physician Specialty

## 2017-11-14 DIAGNOSIS — Z95811 Presence of heart assist device: Secondary | ICD-10-CM

## 2017-11-14 DIAGNOSIS — Z7901 Long term (current) use of anticoagulants: Secondary | ICD-10-CM

## 2017-11-14 NOTE — Addendum Note (Signed)
Addended by: Tanda Rockers B on: 11/14/2017 03:29 PM   Modules accepted: Orders

## 2017-11-17 ENCOUNTER — Ambulatory Visit (HOSPITAL_COMMUNITY): Payer: Self-pay | Admitting: Pharmacist

## 2017-11-17 ENCOUNTER — Encounter (HOSPITAL_COMMUNITY): Payer: Self-pay

## 2017-11-17 ENCOUNTER — Ambulatory Visit (HOSPITAL_COMMUNITY)
Admission: RE | Admit: 2017-11-17 | Discharge: 2017-11-17 | Disposition: A | Discharge status: Home / Self Care | Payer: Medicare Other | Source: Ambulatory Visit | Attending: Cardiology | Admitting: Cardiology

## 2017-11-17 VITALS — BP 118/0 | HR 78 | Ht 60.0 in | Wt 183.6 lb

## 2017-11-17 DIAGNOSIS — Z95810 Presence of automatic (implantable) cardiac defibrillator: Secondary | ICD-10-CM | POA: Diagnosis not present

## 2017-11-17 DIAGNOSIS — I110 Hypertensive heart disease with heart failure: Secondary | ICD-10-CM | POA: Insufficient documentation

## 2017-11-17 DIAGNOSIS — Z86010 Personal history of colonic polyps: Secondary | ICD-10-CM | POA: Diagnosis not present

## 2017-11-17 DIAGNOSIS — I10 Essential (primary) hypertension: Secondary | ICD-10-CM

## 2017-11-17 DIAGNOSIS — I5022 Chronic systolic (congestive) heart failure: Secondary | ICD-10-CM | POA: Diagnosis not present

## 2017-11-17 DIAGNOSIS — Z79899 Other long term (current) drug therapy: Secondary | ICD-10-CM | POA: Insufficient documentation

## 2017-11-17 DIAGNOSIS — Z8674 Personal history of sudden cardiac arrest: Secondary | ICD-10-CM | POA: Diagnosis not present

## 2017-11-17 DIAGNOSIS — I429 Cardiomyopathy, unspecified: Secondary | ICD-10-CM | POA: Insufficient documentation

## 2017-11-17 DIAGNOSIS — I480 Paroxysmal atrial fibrillation: Secondary | ICD-10-CM

## 2017-11-17 DIAGNOSIS — Z7901 Long term (current) use of anticoagulants: Secondary | ICD-10-CM

## 2017-11-17 DIAGNOSIS — Z95811 Presence of heart assist device: Secondary | ICD-10-CM

## 2017-11-17 DIAGNOSIS — M8580 Other specified disorders of bone density and structure, unspecified site: Secondary | ICD-10-CM | POA: Insufficient documentation

## 2017-11-17 LAB — COMPREHENSIVE METABOLIC PANEL
ALT: 14 U/L (ref 14–54)
AST: 26 U/L (ref 15–41)
Albumin: 3.3 g/dL — ABNORMAL LOW (ref 3.5–5.0)
Alkaline Phosphatase: 102 U/L (ref 38–126)
Anion gap: 9 (ref 5–15)
BUN: 9 mg/dL (ref 6–20)
CO2: 21 mmol/L — ABNORMAL LOW (ref 22–32)
Calcium: 8.8 mg/dL — ABNORMAL LOW (ref 8.9–10.3)
Chloride: 108 mmol/L (ref 101–111)
Creatinine, Ser: 0.89 mg/dL (ref 0.44–1.00)
GFR calc Af Amer: >60 mL/min (ref >60)
GFR calc non Af Amer: 60 mL/min — ABNORMAL LOW (ref >60)
Glucose, Bld: 96 mg/dL (ref 65–99)
Potassium: 4.2 mmol/L (ref 3.5–5.1)
Sodium: 138 mmol/L (ref 135–145)
Total Bilirubin: 0.5 mg/dL (ref 0.3–1.2)
Total Protein: 8.1 g/dL (ref 6.5–8.1)

## 2017-11-17 LAB — CBC
HCT: 37.5 % (ref 36.0–46.0)
Hemoglobin: 12 g/dL (ref 12.0–15.0)
MCH: 30.4 pg (ref 26.0–34.0)
MCHC: 32 g/dL (ref 30.0–36.0)
MCV: 94.9 fL (ref 78.0–100.0)
Platelets: 215 10*3/uL (ref 150–400)
RBC: 3.95 MIL/uL (ref 3.87–5.11)
RDW: 16.2 % — ABNORMAL HIGH (ref 11.5–15.5)
WBC: 5.7 10*3/uL (ref 4.0–10.5)

## 2017-11-17 LAB — PROTIME-INR
INR: 2.26
Prothrombin Time: 24.8 seconds — ABNORMAL HIGH (ref 11.4–15.2)

## 2017-11-17 LAB — LACTATE DEHYDROGENASE: LDH: 288 U/L — ABNORMAL HIGH (ref 98–192)

## 2017-11-17 NOTE — Progress Notes (Signed)
Patient presents for 2 month  follow up in [facility] today. Reports no problems with VAD equipment or concerns with drive line.  Pt [place] participating [place] [employer] She started at 5 laps and has worked her way up to 14 laps per visit. (10 laps = one mile)  Vital Signs:  Doppler Pressure:  118 Automatc BP:  123/74 (89) HR:  78 SPO2:  96%  Weight: 183.6 lb w/o eqt Last weight: 180.6 lbs  VAD Indication: Destination Therapy - age excluding and patient choice    VAD interrogation & Equipment Management: Speed:8600 Flow: 4.5 Power: 5.6 w    PI: 4.7  Alarms: 2 low flows (pt denies hearing these alarms) Events: 5 - 20 PI daily  Fixed speed 8600 Low speed limit: 8000  Primary Controller: Replace back up battery in 7 months Back up controller: Replace back up battery in 31months (advisory to change battery now)  Back up controller screen found to have LED malfunction.     Backup controller replaced with: [id number]; exp 05/20/20         Back up battery: [id number]; exp 03/05/19      Exit Site Care: Drive line is being maintained weekly by [name]. Drive line exit site well healed and incorporated. The velour is fully implanted at exit site. Dressing dry and intact. No erythema or drainage. Stabilization device present and accurately applied. Pt denies fever or chills. Gave pt 9 dressing kits for home use.   Significant Events on VAD Support:  09/21/16 GIB, Ramp echo speed decreased to 8400  01/08/17 - LOW FLOW alarms; speed increased to 8800 RPM 05/20/17> speed decreased to 8600  Device:St Jude ICD Therapies: on at 200 bpm; monitor on 110 DDD 60 Last check: 11/16/17  BP & Labs: Doppler BP - 118 is reflecting Modified systolic  Hgb 88.4 - No S/S of bleeding. Specifically denies melena/BRBPR or nosebleeds.  4.5 year Intermacs follow up completed including:  Quality of Life, KCCQ-12, and [employer] trail making.   Pt completed 650 feet during 6 minute  walk. Became SOB at 350 ft; completed walk with several rest stops.   Back up controller: 11V backup battery will be charged at home per [facility]  Patient Instructions: 1. No change in meds. 2. Return to [facility] in 2 months.   Zada Girt RN [place] Coordinator   Office: [phone] 24/7 Emergency VAD Pager: [phone]

## 2017-11-17 NOTE — Progress Notes (Signed)
ADVANCED HF CLINIC NOTE  Patient ID: Brianna Hanson, female   DOB: [birth year], 83 y.o.   MRN: [id number] Primary Heart Failure: Dr Haroldine Laws Cardiac Surgeon: Dr Prescott Gum  HPI: Brianna Hanson is an 83  year old female with a PMH of HF due to severe NICM, chronic systolic HF,  PAF,  plasma cell disorder (Likely IgA MGUS) - followed by Dr. [name] (follwoed q 6 months). Underwent implantation of the HeartMate II LVAD on 04/06/13 for DT. She is not on aspirin due to dizziness. S/P lap cholecystitis 10/2015.   GI Issues 09/2016 LGIB. Angio- no source of bleeding. LVAD speed turned down to 8400  LVAD Speed Change 12/2016 Ramp ECHO -->Speed increased from 8400>8800  She presents today for regular follow up. Doing well overall.  She has had 2 low flow alarms, but asymptomatic.  On 11/06/17 she had 37 PI events, compared to her usual 5-10 a day.  Initially could not attribute any change to this, but on further discussion noted that this was the day she goes to VAD walking club. She denies edema. Hasn't needed any lasix. No lightheadedness or dizziness. No bleeding, melena, or neuro symptoms. Taking all medications as prescribed.   VAD Indication: Destination Therapy - Age excluding and patient choice.   VAD interrogation & Equipment Management: Speed: 8600 Flow: 4.2 Power: 4.5 w PI: 6.7  Alarms: 1-2 on low flow days (seldom) Events: 5-15 PI daily  Fixed speed 8600 Low speed limit: 8000   Past Medical History:  Diagnosis Date  . AICD (automatic cardioverter/defibrillator) present   . Anemia   . Atrial fibrillation or flutter   . Cardiac arrest - ventricular fibrillation 12/10   with successful resucitation, S/p ICD  . CHF (congestive heart failure) ([place])   . Diverticula of colon 2011  . HTN (hypertension)    moderate  . ICD (implantable cardiac defibrillator) in place    she has received appropriate therapy for VF  . Internal and external hemorrhoids without complication 5188  .  Nonischemic cardiomyopathy ([place])    followed by Dr April Holding at [facility]  . Osteopenia   . Plasma cell disorder 03/20/2012  . Plasma cell disorder 03/20/2012  . Sessile colonic polyp 2011   Dr Benson [place]    Current Outpatient Medications  Medication Sig Dispense Refill  . amLODipine (NORVASC) 10 MG tablet Take one tab daily at hour of sleep 30 tablet 6  . carvedilol (COREG) 6.25 MG tablet TAKE 1 TABLET (6.25 MG TOTAL) BY MOUTH 2 (TWO) TIMES DAILY WITH A MEAL. 60 tablet 11  . Cholecalciferol (VITAMIN D3) 1000 UNITS CAPS Take 1 tablet by mouth daily.      [name] docusate sodium (COLACE) 100 MG capsule Take 100 mg by mouth daily.    . ferrous sulfate 325 (65 FE) MG tablet Take 1 tablet (325 mg total) by mouth 2 (two) times daily with a meal. 60 tablet 6  . gabapentin (NEURONTIN) 100 MG capsule TAKE ONE CAPSULE BY MOUTH AT BEDTIME 30 capsule 5  . hydrALAZINE (APRESOLINE) 100 MG tablet Take 1 tablet (100 mg total) by mouth 3 (three) times daily. 360 tablet 6  . KLOR-CON M20 20 MEQ tablet TAKE 1 TABLET BY MOUTH TWICE A DAY 60 tablet 10  . losartan (COZAAR) 100 MG tablet Take 1 tablet (100 mg total) by mouth daily. 180 tablet 3  . pantoprazole (PROTONIX) 40 MG tablet TAKE 1 TABLET BY MOUTH EVERY DAY 30 tablet 6  . traMADol (ULTRAM) 50  MG tablet Take 2 tablets (100 mg total) by mouth every 12 (twelve) hours as needed for moderate pain. 60 tablet 3  . warfarin (COUMADIN) 2.5 MG tablet Take 2 tablets (5 mg) daily except 1 tablet (2.5 mg) on Saturday    . furosemide (LASIX) 20 MG tablet Take 1 tablet (20 mg total) by mouth daily as needed (Only when recommended by CHF clinic.). (Patient not taking: Reported on 06/09/2017) 30 tablet 6   No current facility-administered medications for this encounter.     Patient has no known allergies.  Review of systems complete and found to be negative unless listed in HPI.    Vitals:   11/17/17 1039 11/17/17 1040  BP: 123/74 (!) 118/0  Pulse: 78   SpO2: 96%     Vital  Signs:  Doppler Pressure:  118 Automatc BP:  113/74 (89) HR:  78 SPO2: 96%  Weight: 183.6 lbs w/o eqt Last weight: 180.6 lbs   Physical Exam: GENERAL: Well appearing this am. NAD.  HEENT: Normal. NECK: Supple, JVP 7-8 cm. Carotids OK.  CARDIAC:  Mechanical heart sounds with LVAD hum present.  LUNGS:  CTAB, normal effort.  ABDOMEN:  NT, ND, no HSM. No bruits or masses. +BS  LVAD exit site: Well-healed and incorporated. Dressing dry and intact. No erythema or drainage. Stabilization device present and accurately applied. Driveline dressing changed daily per sterile technique. EXTREMITIES:  Warm and dry. No cyanosis, clubbing, rash, or edema.  NEUROLOGIC:  Alert & oriented x 3. Cranial nerves grossly intact. Moves all 4 extremities w/o difficulty. Affect pleasant     ASSESSMENT AND PLAN:  1) Chronic systolic HF: NICM, s/p ICD and LVAD for DT(04/2013).  - Continues to do very well with VAD support.  - NYHA II symptoms - Volume status stable off lasix. Unable to tolerate Entresto with low flow alarms - Occasional low flows but improves with fluid and salt. Can add florinef or midodrine as needed  2) LVAD placed for DT 04/2013:  - Failed Entresto due to diuresis and incessant low flow alarms.  - Continue losartan 100 mg daily. MAP stable.  - No ASA with GI bleed and intolerance.  - Continue coumadin. No bleeding problems. - LDH 288 - INR 2.26. Discussed with Pharm D personally.  - VAD interrogated personally. Parameters stable.  3) PAF:  - Remains in NSR. Stop amio.  4) HTN:  - Continue current regimen.  5) Chronic Anticoagulation:  - As above.  - Goal INR 1.8-2.3. INR today 2.26. Discussed with pharm-d personally in clinic.   Shirley Friar, PA-C 10:23 AM   Greater than 50% of the 30 minute visit was spent in counseling/coordination of care regarding disease state education, salt/fluid restriction, sliding scale diuretics, and medication compliance.

## 2017-11-18 ENCOUNTER — Ambulatory Visit (HOSPITAL_COMMUNITY): Payer: Self-pay | Admitting: Pharmacist

## 2017-11-18 ENCOUNTER — Other Ambulatory Visit (HOSPITAL_COMMUNITY): Payer: Self-pay | Admitting: Internal Medicine

## 2017-11-18 LAB — POCT INR: INR: 2.4

## 2017-11-25 ENCOUNTER — Ambulatory Visit (HOSPITAL_COMMUNITY): Payer: Self-pay | Admitting: Pharmacist

## 2017-11-25 LAB — POCT INR: INR: 1.9

## 2017-12-02 ENCOUNTER — Ambulatory Visit (HOSPITAL_COMMUNITY): Payer: Self-pay | Admitting: Pharmacist

## 2017-12-02 DIAGNOSIS — Z7901 Long term (current) use of anticoagulants: Secondary | ICD-10-CM | POA: Diagnosis not present

## 2017-12-02 LAB — POCT INR: INR: 1.9

## 2017-12-07 ENCOUNTER — Other Ambulatory Visit (HOSPITAL_COMMUNITY): Payer: Self-pay | Admitting: Internal Medicine

## 2017-12-09 ENCOUNTER — Ambulatory Visit (HOSPITAL_COMMUNITY): Payer: Self-pay | Admitting: Pharmacist

## 2017-12-09 LAB — POCT INR: INR: 2.3

## 2017-12-16 ENCOUNTER — Ambulatory Visit (HOSPITAL_COMMUNITY): Payer: Self-pay | Admitting: Pharmacist

## 2017-12-16 ENCOUNTER — Other Ambulatory Visit (HOSPITAL_COMMUNITY): Payer: Self-pay | Admitting: Internal Medicine

## 2017-12-16 LAB — POCT INR: INR: 2.6

## 2017-12-23 ENCOUNTER — Ambulatory Visit (HOSPITAL_COMMUNITY): Payer: Self-pay | Admitting: Pharmacist

## 2017-12-23 LAB — POCT INR: INR: 2.7

## 2017-12-29 ENCOUNTER — Other Ambulatory Visit (HOSPITAL_COMMUNITY): Payer: Self-pay | Admitting: Unknown Physician Specialty

## 2017-12-29 MED ORDER — WARFARIN SODIUM 2.5 MG PO TABS: 2.5000 mg | ORAL_TABLET | Freq: Every day | ORAL | 11 refills | Status: DC

## 2017-12-30 ENCOUNTER — Ambulatory Visit (HOSPITAL_COMMUNITY): Payer: Self-pay | Admitting: Pharmacist

## 2017-12-30 DIAGNOSIS — Z7901 Long term (current) use of anticoagulants: Secondary | ICD-10-CM | POA: Diagnosis not present

## 2017-12-30 LAB — POCT INR: INR: 2.8

## 2018-01-07 ENCOUNTER — Ambulatory Visit (HOSPITAL_COMMUNITY): Payer: Self-pay | Admitting: Pharmacist

## 2018-01-07 LAB — POCT INR: INR: 2.4

## 2018-01-13 ENCOUNTER — Ambulatory Visit (HOSPITAL_COMMUNITY): Payer: Self-pay | Admitting: Pharmacist

## 2018-01-13 ENCOUNTER — Telehealth (HOSPITAL_COMMUNITY): Payer: Self-pay | Admitting: Surgery

## 2018-01-13 LAB — POCT INR: INR: 1.8

## 2018-01-13 NOTE — Telephone Encounter (Signed)
Patient called back to clarify instructions given by Bea Graff D regarding her Coumadin dosing.  I looked up and verified for her to take 5 mg of Coumadin everyday except to take 3.75 on Thursday and Sat.  I also reviewed this information with [name] (her caregiver).  They are aware and understand instructions.

## 2018-01-19 ENCOUNTER — Ambulatory Visit (HOSPITAL_COMMUNITY)
Admission: RE | Admit: 2018-01-19 | Discharge: 2018-01-19 | Disposition: A | Discharge status: Home / Self Care | Payer: Medicare Other | Source: Ambulatory Visit | Attending: Internal Medicine | Admitting: Internal Medicine

## 2018-01-19 ENCOUNTER — Other Ambulatory Visit (HOSPITAL_COMMUNITY): Payer: Self-pay | Admitting: Unknown Physician Specialty

## 2018-01-19 VITALS — BP 110/0 | HR 80 | Ht 60.0 in | Wt 183.0 lb

## 2018-01-19 DIAGNOSIS — Z95811 Presence of heart assist device: Secondary | ICD-10-CM | POA: Diagnosis not present

## 2018-01-19 DIAGNOSIS — I428 Other cardiomyopathies: Secondary | ICD-10-CM | POA: Insufficient documentation

## 2018-01-19 DIAGNOSIS — I480 Paroxysmal atrial fibrillation: Secondary | ICD-10-CM | POA: Diagnosis not present

## 2018-01-19 DIAGNOSIS — Z95810 Presence of automatic (implantable) cardiac defibrillator: Secondary | ICD-10-CM | POA: Diagnosis not present

## 2018-01-19 DIAGNOSIS — Z7901 Long term (current) use of anticoagulants: Secondary | ICD-10-CM | POA: Diagnosis not present

## 2018-01-19 DIAGNOSIS — I5022 Chronic systolic (congestive) heart failure: Secondary | ICD-10-CM | POA: Diagnosis not present

## 2018-01-19 DIAGNOSIS — I110 Hypertensive heart disease with heart failure: Secondary | ICD-10-CM | POA: Diagnosis not present

## 2018-01-19 LAB — CBC
HCT: 38.9 % (ref 36.0–46.0)
Hemoglobin: 12.1 g/dL (ref 12.0–15.0)
MCH: 28.8 pg (ref 26.0–34.0)
MCHC: 31.1 g/dL (ref 30.0–36.0)
MCV: 92.6 fL (ref 78.0–100.0)
Platelets: 250 10*3/uL (ref 150–400)
RBC: 4.2 MIL/uL (ref 3.87–5.11)
RDW: 15.9 % — ABNORMAL HIGH (ref 11.5–15.5)
WBC: 4.8 10*3/uL (ref 4.0–10.5)

## 2018-01-19 LAB — PROTIME-INR
INR: 1.9
Prothrombin Time: 21.6 seconds — ABNORMAL HIGH (ref 11.4–15.2)

## 2018-01-19 LAB — BASIC METABOLIC PANEL
Anion gap: 10 (ref 5–15)
BUN: 8 mg/dL (ref 6–20)
CO2: 21 mmol/L — ABNORMAL LOW (ref 22–32)
Calcium: 9.1 mg/dL (ref 8.9–10.3)
Chloride: 109 mmol/L (ref 101–111)
Creatinine, Ser: 0.92 mg/dL (ref 0.44–1.00)
GFR calc Af Amer: >60 mL/min (ref >60)
GFR calc non Af Amer: 57 mL/min — ABNORMAL LOW (ref >60)
Glucose, Bld: 111 mg/dL — ABNORMAL HIGH (ref 65–99)
Potassium: 4.1 mmol/L (ref 3.5–5.1)
Sodium: 140 mmol/L (ref 135–145)

## 2018-01-19 LAB — LACTATE DEHYDROGENASE: LDH: 240 U/L — ABNORMAL HIGH (ref 98–192)

## 2018-01-19 NOTE — Progress Notes (Signed)
Patient presents for 2 month  follow up in [facility] today. Reports no problems with VAD equipment or concerns with drive line.  Vital Signs:  Doppler Pressure:  110 Automatc BP:  118/65 (88) HR:  80 SPO2:  96%  Weight: 183 lb w/o eqt Last weight: 183.6 lbs  VAD Indication: Destination Therapy - age excluding and patient choice    VAD interrogation & Equipment Management: Speed:8600 Flow: 4.3 Power: 4.5 w    PI: 6.6  Alarms: 4 low flows on 5/12 and 2 low flows on 5/11. Events: 5 - 20 PI daily  Fixed speed 8600 Low speed limit: 8000  Primary Controller: Expired. Replaced with [id number]. Backup battery expiring in 32 months, Back up controller: Replace back up battery in 27 months.  Exit Site Care: Drive line is being maintained weekly by [employer]. Drive line exit site well healed and incorporated. The velour is fully implanted at exit site. Dressing dry and intact. No erythema or drainage. Stabilization device present and accurately applied. Pt denies fever or chills. Gave pt 9 dressing kits for home use.   Significant Events on VAD Support:  09/21/16 GIB, Ramp echo speed decreased to 8400  01/08/17 - LOW FLOW alarms; speed increased to 8800 RPM 05/20/17> speed decreased to 8600  Device:St Jude ICD Therapies: on at 200 bpm; monitor on 110 DDD 60 Last check: 11/16/17  BP & Labs: Doppler BP - 110 is reflecting Modified systolic  Hgb 67.8 - No S/S of bleeding. Specifically denies melena/BRBPR or nosebleeds.  LDH 240  -  Within the range of  150 - 300. Denies any dark urine.   Patient Instructions: 1. No change in meds. 2. Obtain pressure stockings to help control ankle edema. 3. Return to [facility] clinic in 2 months.   Tanda Rockers RN [place] Coordinator   Office: [phone] 24/7 Emergency VAD Pager: [phone]

## 2018-01-20 ENCOUNTER — Ambulatory Visit (HOSPITAL_COMMUNITY): Payer: Self-pay | Admitting: Pharmacist

## 2018-01-20 NOTE — Progress Notes (Signed)
ADVANCED HF CLINIC NOTE  Patient ID: Brianna Hanson, female   DOB: 1935/03/25, 83 y.o.   MRN: [id number] Primary Heart Failure: Dr Haroldine Laws Cardiac Surgeon: Dr Prescott Gum  HPI: Brianna Hanson is an 83 year old female with a PMH of HF due to severe NICM, chronic systolic HF,  PAF,  plasma cell disorder (Likely IgA MGUS) - followed by Dr. [name] (follwoed q 6 months). Underwent implantation of the HeartMate II LVAD on 04/06/13 for DT. She is not on aspirin due to dizziness. S/P lap cholecystitis 10/2015.   GI Issues 09/2016 LGIB. Angio- no source of bleeding. LVAD speed turned down to 8400  LVAD Speed Change 12/2016 Ramp ECHO -->Speed increased from 8400>8800  She presents today for regular follow up. Feels good. [facility]. No orthopnea or PND. Denies orthopnea or PND. No fevers, chills or problems with driveline. No bleeding, melena or neuro symptoms. No VAD alarms. Taking all meds as prescribed. Took lasix 2x in the past week and had a few PI events. No dizziness.    VAD Indication: Destination Therapy - age excluding and patient choice    VAD interrogation & Equipment Management: Speed:8600 Flow: 4.3 Power: 4.5 w PI: 6.6  Alarms: 4 low flows on 5/12 and 2 low flows on 5/11. Events: 5 - 20 PI daily  Fixed speed 8600 Low speed limit: 8000  Primary Controller: Expired. Replaced with [id number]. Backup battery expiring in 32 months, Back up controller: Replace back up battery in27 months.    Past Medical History:  Diagnosis Date  . AICD (automatic cardioverter/defibrillator) present   . Anemia   . Atrial fibrillation or flutter   . Cardiac arrest - ventricular fibrillation 12/10   with successful resucitation, S/p ICD  . CHF (congestive heart failure) ([place])   . Diverticula of colon 2011  . HTN (hypertension)    moderate  . ICD (implantable cardiac defibrillator) in place    she has received appropriate therapy for VF  . Internal  and external hemorrhoids without complication 6269  . Nonischemic cardiomyopathy ([place])    [facility] Dr April Holding at [facility]  . Osteopenia   . Plasma cell disorder 03/20/2012  . Plasma cell disorder 03/20/2012  . Sessile colonic polyp 2011   Dr Benson [place]    Current Outpatient Medications  Medication Sig Dispense Refill  . amLODipine (NORVASC) 10 MG tablet Take one tab daily at hour of sleep 30 tablet 6  . carvedilol (COREG) 6.25 MG tablet TAKE 1 TABLET (6.25 MG TOTAL) BY MOUTH 2 (TWO) TIMES DAILY WITH A MEAL. 60 tablet 11  . Cholecalciferol (VITAMIN D3) 1000 UNITS CAPS Take 1 tablet by mouth daily.      [name] docusate sodium (COLACE) 100 MG capsule Take 100 mg by mouth daily.    . ferrous sulfate 325 (65 FE) MG tablet Take 1 tablet (325 mg total) by mouth 2 (two) times daily with a meal. 60 tablet 6  . gabapentin (NEURONTIN) 100 MG capsule TAKE 1 CAPSULE BY MOUTH EVERYDAY AT BEDTIME 90 capsule 3  . hydrALAZINE (APRESOLINE) 100 MG tablet Take 1 tablet (100 mg total) by mouth 3 (three) times daily. 360 tablet 6  . KLOR-CON M20 20 MEQ tablet TAKE 1 TABLET BY MOUTH TWICE A DAY 60 tablet 10  . losartan (COZAAR) 100 MG tablet Take 1 tablet (100 mg total) by mouth daily. 180 tablet 3  . pantoprazole (PROTONIX) 40 MG tablet TAKE 1 TABLET BY MOUTH  EVERY DAY 30 tablet 6  . traMADol (ULTRAM) 50 MG tablet Take 2 tablets (100 mg total) by mouth every 12 (twelve) hours as needed for moderate pain. 60 tablet 3  . warfarin (COUMADIN) 2.5 MG tablet Take 2 tablets (5 mg) daily except 1 and 1/2 tablets (3.75 mg) on Thurs, Sat.    . furosemide (LASIX) 20 MG tablet Take 1 tablet (20 mg total) by mouth daily as needed (Only when recommended by CHF clinic.). (Patient not taking: Reported on 06/09/2017) 30 tablet 6   No current facility-administered medications for this encounter.     Patient has no known allergies.  Review of systems complete and found to be negative unless listed in HPI.    Vitals:   01/19/18 1009  01/19/18 1010  BP: 118/65 (!) 110/0  Pulse: 80   SpO2: 96%     Vital Signs:  Doppler Pressure:  110 Automatc BP:  118/65 (88) HR:  80 SPO2:  96%  Weight: 183 lb w/o eqt Last weight: 183.6 lbs  General:  NAD.  HEENT: normal  Neck: supple. JVP not elevated.  Carotids 2+ bilat; no bruits. No lymphadenopathy or thryomegaly appreciated. Cor: LVAD hum.  Lungs: Clear. Abdomen: obese soft, nontender, non-distended. No hepatosplenomegaly. No bruits or masses. Good bowel sounds. Driveline site clean. Anchor in place.  Extremities: no cyanosis, clubbing, rash. Warm no edema  Neuro: alert & oriented x 3. No focal deficits. Moves all 4 without problem    ASSESSMENT AND PLAN:  1) Chronic systolic HF: NICM, s/p ICD and LVAD for DT(04/2013).  - Continues to do very well with VAD support.  - NYHA I-II symptoms.  - Volume status stable off lasix. Unable to tolerate Entresto with low flow alarms - Had PI events on 5/11 and 5/12 after taking PRN lasix. Reminded to take just as needed - Occasional low flows but improves with fluid and salt. Can add florinef or midodrine as needed  2) LVAD placed for DT 04/2013:  - Failed Entresto due to diuresis and incessant low flow alarms.  - MAPs look good. Continue losartan 100 mg daily. MAP stable.  - No ASA with GI bleed and intolerance.  - Continue coumadin. No bleeding problems. - LDH 240 - INR 1.90. Discussed dosing with PharmD personally. - VAD interrogated personally. Parameters stable. PI events associated with lasix as above.  3) PAF:  - Remains in NSR. Off amio  4) HTN:  - C.bp 5) Chronic Anticoagulation:  - As above.  - Goal INR 1.8-2.3. INR today 1.9. Discussed dosing with PharmD personally. - Hgb 12.1  Total time spent 35 minutes. Over half that time spent discussing above.    Glori Bickers, MD 8:31 AM

## 2018-01-27 ENCOUNTER — Ambulatory Visit (HOSPITAL_COMMUNITY): Payer: Self-pay | Admitting: Pharmacist

## 2018-01-27 DIAGNOSIS — Z7901 Long term (current) use of anticoagulants: Secondary | ICD-10-CM | POA: Diagnosis not present

## 2018-01-27 LAB — POCT INR: INR: 2.7 (ref 2.0–3.0)

## 2018-02-03 ENCOUNTER — Ambulatory Visit (HOSPITAL_COMMUNITY): Payer: Self-pay | Admitting: Pharmacist

## 2018-02-03 ENCOUNTER — Ambulatory Visit (INDEPENDENT_AMBULATORY_CARE_PROVIDER_SITE_OTHER): Payer: Medicare Other | Admitting: *Deleted

## 2018-02-03 DIAGNOSIS — I4901 Ventricular fibrillation: Secondary | ICD-10-CM

## 2018-02-03 DIAGNOSIS — I429 Cardiomyopathy, unspecified: Secondary | ICD-10-CM

## 2018-02-03 LAB — POCT INR: INR: 2.1 (ref 2–3)

## 2018-02-03 NOTE — Progress Notes (Signed)
Remote ICD transmission.   

## 2018-02-04 ENCOUNTER — Ambulatory Visit (INDEPENDENT_AMBULATORY_CARE_PROVIDER_SITE_OTHER): Payer: Medicare Other | Admitting: Internal Medicine

## 2018-02-04 ENCOUNTER — Encounter: Payer: Self-pay | Admitting: Internal Medicine

## 2018-02-04 VITALS — BP 102/0 | HR 92 | Ht 60.0 in | Wt 183.0 lb

## 2018-02-04 DIAGNOSIS — I4901 Ventricular fibrillation: Secondary | ICD-10-CM

## 2018-02-04 DIAGNOSIS — I480 Paroxysmal atrial fibrillation: Secondary | ICD-10-CM | POA: Diagnosis not present

## 2018-02-04 DIAGNOSIS — I5022 Chronic systolic (congestive) heart failure: Secondary | ICD-10-CM

## 2018-02-04 DIAGNOSIS — Z95810 Presence of automatic (implantable) cardiac defibrillator: Secondary | ICD-10-CM

## 2018-02-04 DIAGNOSIS — Z95811 Presence of heart assist device: Secondary | ICD-10-CM

## 2018-02-04 DIAGNOSIS — Z23 Encounter for immunization: Secondary | ICD-10-CM | POA: Diagnosis not present

## 2018-02-04 NOTE — Progress Notes (Signed)
PCP: Janith Lima, MD Primary Cardiologist: Dr Haroldine Laws Primary EP: Dr Rayann Heman  Brianna Hanson is a 83 y.o. female who presents today for routine electrophysiology followup.  Since last being seen in our clinic, the patient reports doing very well. She remains happy and active.  Today, she denies symptoms of palpitations, chest pain, shortness of breath,  lower extremity edema, dizziness, presyncope, syncope, or ICD shocks.  The patient is otherwise without complaint today.   Past Medical History:  Diagnosis Date  . AICD (automatic cardioverter/defibrillator) present   . Anemia   . Atrial fibrillation or flutter   . Cardiac arrest - ventricular fibrillation 12/10   with successful resucitation, S/p ICD  . CHF (congestive heart failure) ([place])   . Diverticula of colon 2011  . HTN (hypertension)    moderate  . ICD (implantable cardiac defibrillator) in place    she has received appropriate therapy for VF  . Internal and external hemorrhoids without complication 0932  . Nonischemic cardiomyopathy ([place])    followed by Dr April Holding at [facility]  . Osteopenia   . Plasma cell disorder 03/20/2012  . Plasma cell disorder 03/20/2012  . Sessile colonic polyp 2011   Dr Benson Norway   Past Surgical History:  Procedure Laterality Date  . ABDOMINAL HYSTERECTOMY    . CARDIAC CATHETERIZATION    . CARDIAC DEFIBRILLATOR PLACEMENT     by Greggory Brandy for secondary prevention of sudden death  . CHOLECYSTECTOMY N/A 2015-10-31   Procedure: LAPAROSCOPIC CHOLECYSTECTOMY;  Surgeon: Rolm Bookbinder, MD;  Location: [place];  Service: General;  Laterality: N/A;  . COLONOSCOPY W/ POLYPECTOMY  2011   Dr Benson Norway  . INSERTION OF IMPLANTABLE LEFT VENTRICULAR ASSIST DEVICE N/A 04/06/2013   Procedure: INSERTION OF IMPLANTABLE LEFT VENTRICULAR ASSIST DEVICE;  Surgeon: Gaye Pollack, MD;  Location: [place];  Service: Open Heart Surgery;  Laterality: N/A;  . INTRAOPERATIVE TRANSESOPHAGEAL ECHOCARDIOGRAM N/A 04/06/2013   Procedure:  INTRAOPERATIVE TRANSESOPHAGEAL ECHOCARDIOGRAM;  Surgeon: Gaye Pollack, MD;  Location: [place] [facility];  Service: Open Heart Surgery;  Laterality: N/A;  . IR GENERIC HISTORICAL  09/24/2016   IR US GUIDE VASC ACCESS RIGHT 09/24/2016 Greggory Keen, MD [facility]  . IR GENERIC HISTORICAL  09/24/2016   IR ANGIOGRAM VISCERAL SELECTIVE 09/24/2016 Greggory Keen, MD [facility]  . IR GENERIC HISTORICAL  09/24/2016   IR ANGIOGRAM VISCERAL SELECTIVE 09/24/2016 Greggory Keen, MD [facility]  . PLACEMENT OF CENTRIMAG VENTRICULAR ASSIST DEVICE Right 04/06/2013   Procedure: PLACEMENT OF CENTRIMAG VENTRICULAR ASSIST DEVICE;  Surgeon: Gaye Pollack, MD;  Location: [place];  Service: Open Heart Surgery;  Laterality: Right;    ROS- all systems are reviewed and negative except as per HPI above  Current Outpatient Medications  Medication Sig Dispense Refill  . amLODipine (NORVASC) 10 MG tablet Take one tab daily at hour of sleep 30 tablet 6  . carvedilol (COREG) 6.25 MG tablet TAKE 1 TABLET (6.25 MG TOTAL) BY MOUTH 2 (TWO) TIMES DAILY WITH A MEAL. 60 tablet 11  . Cholecalciferol (VITAMIN D3) 1000 UNITS CAPS Take 1 tablet by mouth daily.      [name] docusate sodium (COLACE) 100 MG capsule Take 100 mg by mouth daily.    . ferrous sulfate 325 (65 FE) MG tablet Take 1 tablet (325 mg total) by mouth 2 (two) times daily with a meal. 60 tablet 6  . furosemide (LASIX) 20 MG tablet Take 1 tablet (20 mg total) by mouth daily as needed (Only when recommended by  CHF clinic.). 30 tablet 6  . gabapentin (NEURONTIN) 100 MG capsule TAKE 1 CAPSULE BY MOUTH EVERYDAY AT BEDTIME 90 capsule 3  . hydrALAZINE (APRESOLINE) 100 MG tablet Take 1 tablet (100 mg total) by mouth 3 (three) times daily. 360 tablet 6  . KLOR-CON M20 20 MEQ tablet TAKE 1 TABLET BY MOUTH TWICE A DAY 60 tablet 10  . pantoprazole (PROTONIX) 40 MG tablet TAKE 1 TABLET BY MOUTH EVERY DAY 30 tablet 6  . traMADol (ULTRAM) 50 MG tablet Take 2 tablets (100 mg total) by mouth every 12  (twelve) hours as needed for moderate pain. 60 tablet 3  . warfarin (COUMADIN) 2.5 MG tablet Take 2 tablets (5 mg) daily except 1 and 1/2 tablets (3.75 mg) on Thurs, Sat.    [name] losartan (COZAAR) 100 MG tablet Take 1 tablet (100 mg total) by mouth daily. 180 tablet 3   No current facility-administered medications for this visit.     Physical Exam: Vitals:   02/04/18 1033  BP: (!) 102/0  Pulse: 92  Weight: 183 lb (83 kg)  Height: 5' (1.524 m)    GEN- The patient is well appearing, alert and oriented x 3 today.   Head- normocephalic, atraumatic Eyes-  Sclera clear, conjunctiva pink Ears- hearing intact Oropharynx- clear Lungs- Clear to ausculation bilaterally, normal work of breathing Chest- ICD pocket is well healed Heart- VAD hum GI- soft, NT, ND, + BS Extremities- no clubbing, cyanosis, or edema  ICD interrogation- reviewed in detail today,  See PACEART report  ekg tracing ordered today is personally reviewed and shows sinus rhythm with demand atrial pacing.  + PVCs, NSVT noted, lateral infarction  Wt Readings from Last 3 Encounters:  02/04/18 183 lb (83 kg)  01/19/18 183 lb (83 kg)  11/17/17 183 lb 9.6 oz (83.3 kg)    Assessment and Plan:  1.  Chronic systolic dysfunction euvolemic today S/p VAD Stable on an appropriate medical regimen Normal ICD function See Pace Art report No changes today  2. Prior VF Doing well now off Amiodarone No change today  3. afib afib burden < 1% No changes On coumadin  Merlin Return in a year  Thompson Grayer MD, [facility] 02/04/2018 11:31 AM'

## 2018-02-04 NOTE — Patient Instructions (Signed)
Medication Instructions:  Your physician recommends that you continue on your current medications as directed. Please refer to the Current Medication list given to you today.  Labwork: None ordered.  Testing/Procedures: None ordered.  Follow-Up: Your physician wants you to follow-up in: one year with Dr. Rayann Heman.   You will receive a reminder letter in the mail two months in advance. If you don't receive a letter, please call our office to schedule the follow-up appointment.  Remote monitoring is used to monitor your ICD from home. This monitoring reduces the number of office visits required to check your device to one time per year. It allows [place] to keep an eye on the functioning of your device to ensure it is working properly. You are scheduled for a device check from home on 05/05/2018. You may send your transmission at any time that day. If you have a wireless device, the transmission will be sent automatically. After your physician reviews your transmission, you will receive a postcard with your next transmission date.  Any Other Special Instructions Will Be Listed Below (If Applicable).  If you need a refill on your cardiac medications before your next appointment, please call your pharmacy.

## 2018-02-10 ENCOUNTER — Ambulatory Visit (HOSPITAL_COMMUNITY): Payer: Self-pay | Admitting: Pharmacist

## 2018-02-10 LAB — POCT INR: INR: 2.2 (ref 2.0–3.0)

## 2018-02-11 ENCOUNTER — Telehealth: Payer: Self-pay

## 2018-02-12 NOTE — Telephone Encounter (Signed)
Appointment scheduled.

## 2018-02-12 NOTE — Telephone Encounter (Signed)
Can you call patient for an appointment. We have not seen her in a year.

## 2018-02-17 ENCOUNTER — Ambulatory Visit (HOSPITAL_COMMUNITY): Payer: Self-pay | Admitting: Pharmacist

## 2018-02-17 LAB — CUP PACEART INCLINIC DEVICE CHECK
Brady Statistic RA Percent Paced: 3.1 %
Brady Statistic RV Percent Paced: 1 % — CL
Date Time Interrogation Session: 20190618175520
HighPow Impedance: 48 Ohm
Implantable Lead Implant Date: 20110104
Implantable Lead Implant Date: 20110104
Implantable Lead Location: 753859
Implantable Lead Location: 753860
Implantable Lead Model: 7121
Implantable Pulse Generator Implant Date: 20110104
Lead Channel Impedance Value: 310 Ohm
Lead Channel Impedance Value: 310 Ohm
Lead Channel Pacing Threshold Amplitude: 0.75 V
Lead Channel Pacing Threshold Amplitude: 0.75 V
Lead Channel Pacing Threshold Pulse Width: 0.5 ms
Lead Channel Pacing Threshold Pulse Width: 0.5 ms
Lead Channel Sensing Intrinsic Amplitude: 0.9 mV
Lead Channel Sensing Intrinsic Amplitude: 11.1 mV
Lead Channel Setting Pacing Amplitude: 2 V
Lead Channel Setting Pacing Amplitude: 2.5 V
Lead Channel Setting Pacing Pulse Width: 0.5 ms
Lead Channel Setting Sensing Sensitivity: 0.5 mV
Pulse Gen Serial Number: 754815

## 2018-02-17 LAB — POCT INR: INR: 1.9 — AB (ref 2.0–3.0)

## 2018-02-24 ENCOUNTER — Ambulatory Visit (HOSPITAL_COMMUNITY): Payer: Self-pay | Admitting: Pharmacist

## 2018-02-24 DIAGNOSIS — Z7901 Long term (current) use of anticoagulants: Secondary | ICD-10-CM | POA: Diagnosis not present

## 2018-02-24 LAB — CUP PACEART REMOTE DEVICE CHECK
Battery Remaining Longevity: 23 mo
Battery Remaining Percentage: 23 %
Battery Voltage: 2.8 V
Brady Statistic AP VP Percent: 1 %
Brady Statistic AP VS Percent: 8.1 %
Brady Statistic AS VP Percent: 1 %
Brady Statistic AS VS Percent: 87 %
Brady Statistic RA Percent Paced: 3.1 %
Brady Statistic RV Percent Paced: 1 %
Date Time Interrogation Session: 20190604092300
HighPow Impedance: 44 Ohm
Implantable Lead Implant Date: 20110104
Implantable Lead Implant Date: 20110104
Implantable Lead Location: 753859
Implantable Lead Location: 753860
Implantable Lead Model: 7121
Implantable Pulse Generator Implant Date: 20110104
Lead Channel Impedance Value: 310 Ohm
Lead Channel Impedance Value: 310 Ohm
Lead Channel Pacing Threshold Amplitude: 0.75 V
Lead Channel Pacing Threshold Amplitude: 0.75 V
Lead Channel Pacing Threshold Pulse Width: 0.5 ms
Lead Channel Pacing Threshold Pulse Width: 0.5 ms
Lead Channel Sensing Intrinsic Amplitude: 0.7 mV
Lead Channel Sensing Intrinsic Amplitude: 9.2 mV
Lead Channel Setting Pacing Amplitude: 2 V
Lead Channel Setting Pacing Amplitude: 2.5 V
Lead Channel Setting Pacing Pulse Width: 0.5 ms
Lead Channel Setting Sensing Sensitivity: 0.5 mV
Pulse Gen Serial Number: 754815

## 2018-02-24 LAB — POCT INR: INR: 1.9 — AB (ref 2.0–3.0)

## 2018-03-03 ENCOUNTER — Ambulatory Visit (HOSPITAL_COMMUNITY): Payer: Self-pay | Admitting: Pharmacist

## 2018-03-03 LAB — POCT INR: INR: 2.1 (ref 2–3)

## 2018-03-04 ENCOUNTER — Ambulatory Visit (INDEPENDENT_AMBULATORY_CARE_PROVIDER_SITE_OTHER): Payer: Medicare Other | Admitting: Internal Medicine

## 2018-03-04 ENCOUNTER — Encounter: Payer: Self-pay | Admitting: Internal Medicine

## 2018-03-04 ENCOUNTER — Other Ambulatory Visit (INDEPENDENT_AMBULATORY_CARE_PROVIDER_SITE_OTHER): Payer: Medicare Other

## 2018-03-04 VITALS — BP 108/70 | HR 44 | Temp 98.5°F | Resp 16 | Ht 60.0 in | Wt 182.0 lb

## 2018-03-04 DIAGNOSIS — I10 Essential (primary) hypertension: Secondary | ICD-10-CM | POA: Diagnosis not present

## 2018-03-04 DIAGNOSIS — L309 Dermatitis, unspecified: Secondary | ICD-10-CM | POA: Insufficient documentation

## 2018-03-04 DIAGNOSIS — I4819 Other persistent atrial fibrillation: Secondary | ICD-10-CM

## 2018-03-04 DIAGNOSIS — E785 Hyperlipidemia, unspecified: Secondary | ICD-10-CM

## 2018-03-04 DIAGNOSIS — R7309 Other abnormal glucose: Secondary | ICD-10-CM | POA: Diagnosis not present

## 2018-03-04 DIAGNOSIS — I481 Persistent atrial fibrillation: Secondary | ICD-10-CM

## 2018-03-04 LAB — HEMOGLOBIN A1C: Hgb A1c MFr Bld: 5.2 % (ref 4.6–6.5)

## 2018-03-04 LAB — LIPID PANEL
Cholesterol: 171 mg/dL (ref 0–200)
HDL: 47.2 mg/dL (ref >39.00)
LDL Cholesterol: 103 mg/dL — ABNORMAL HIGH (ref 0–99)
NonHDL: 123.65
Total CHOL/HDL Ratio: 4
Triglycerides: 105 mg/dL (ref 0.0–149.0)
VLDL: 21 mg/dL (ref 0.0–40.0)

## 2018-03-04 LAB — THYROID PANEL WITH TSH
Free Thyroxine Index: 2.6 (ref 1.4–3.8)
T3 Uptake: 35 % (ref 22–35)
T4, Total: 7.5 ug/dL (ref 5.1–11.9)
TSH: 2.88 mIU/L (ref 0.40–4.50)

## 2018-03-04 MED ORDER — EPICERAM EX EMUL
1.0000 | Freq: Two times a day (BID) | CUTANEOUS | 1 refills | Status: DC
Start: 2018-03-04 — End: 2018-05-19

## 2018-03-04 NOTE — Progress Notes (Signed)
Subjective:  Patient ID: Brianna Hanson, female    DOB: 1934-11-09  Age: 83 y.o. MRN: [id number]  CC: Hypertension; Hyperlipidemia; Atrial Fibrillation; Congestive Heart Failure; and Rash   HPI [name] presents for f/up - she complains of a rash around her right ankle for many months.  It rarely itches.  She has been trying to treat it with a topical, over-the-counter oatmeal application.  She does not have the rash anywhere else.  She tells me her blood pressure has been well controlled.  She denies any recent episodes of CP, palpitations, edema, fatigue, or weight gain.  Outpatient Medications Prior to Visit  Medication Sig Dispense Refill  . amLODipine (NORVASC) 10 MG tablet Take one tab daily at hour of sleep 30 tablet 6  . carvedilol (COREG) 6.25 MG tablet TAKE 1 TABLET (6.25 MG TOTAL) BY MOUTH 2 (TWO) TIMES DAILY WITH A MEAL. 60 tablet 11  . Cholecalciferol (VITAMIN D3) 1000 UNITS CAPS Take 1 tablet by mouth daily.      [name] docusate sodium (COLACE) 100 MG capsule Take 100 mg by mouth daily.    . ferrous sulfate 325 (65 FE) MG tablet Take 1 tablet (325 mg total) by mouth 2 (two) times daily with a meal. 60 tablet 6  . furosemide (LASIX) 20 MG tablet Take 1 tablet (20 mg total) by mouth daily as needed (Only when recommended by CHF clinic.). 30 tablet 6  . gabapentin (NEURONTIN) 100 MG capsule TAKE 1 CAPSULE BY MOUTH EVERYDAY AT BEDTIME 90 capsule 3  . hydrALAZINE (APRESOLINE) 100 MG tablet Take 1 tablet (100 mg total) by mouth 3 (three) times daily. 360 tablet 6  . KLOR-CON M20 20 MEQ tablet TAKE 1 TABLET BY MOUTH TWICE A DAY 60 tablet 10  . pantoprazole (PROTONIX) 40 MG tablet TAKE 1 TABLET BY MOUTH EVERY DAY 30 tablet 6  . traMADol (ULTRAM) 50 MG tablet Take 2 tablets (100 mg total) by mouth every 12 (twelve) hours as needed for moderate pain. 60 tablet 3  . warfarin (COUMADIN) 2.5 MG tablet Take 2 tablets (5 mg) daily except 1 and 1/2 tablets (3.75 mg) on Thurs, Sat.    [name]  losartan (COZAAR) 100 MG tablet Take 1 tablet (100 mg total) by mouth daily. 180 tablet 3   No facility-administered medications prior to visit.     ROS Review of Systems  Constitutional: Negative for diaphoresis and fatigue.  HENT: Negative.   Eyes: Negative for visual disturbance.  Respiratory: Negative for cough, chest tightness, shortness of breath and wheezing.   Cardiovascular: Negative for chest pain, palpitations and leg swelling.  Gastrointestinal: Negative for abdominal pain, constipation, diarrhea, nausea and vomiting.  Endocrine: Negative.   Genitourinary: Negative.  Negative for difficulty urinating.  Musculoskeletal: Negative.  Negative for arthralgias, myalgias and neck pain.  Skin: Positive for rash. Negative for color change.  Allergic/Immunologic: Negative.   Neurological: Negative.  Negative for dizziness, syncope, weakness and headaches.  Hematological: Negative for adenopathy. Does not bruise/bleed easily.  Psychiatric/Behavioral: Negative.     Objective:  BP 108/70 (BP Location: Left Arm, Patient Position: Sitting, Cuff Size: Large)   Pulse (!) 44   Temp 98.5 F (36.9 C) (Oral)   Resp 16   Ht 5' (1.524 m)   Wt 182 lb (82.6 kg)   SpO2 95%   BMI 35.54 kg/m   BP Readings from Last 3 Encounters:  03/04/18 108/70  02/04/18 (!) 102/0  01/19/18 (!) 110/0    [facility]  Readings from Last 3 Encounters:  03/04/18 182 lb (82.6 kg)  02/04/18 183 lb (83 kg)  01/19/18 183 lb (83 kg)    Physical Exam  Constitutional: She is oriented to person, place, and time. No distress.  HENT:  Mouth/Throat: Oropharynx is clear and moist. No oropharyngeal exudate.  Eyes: Conjunctivae are normal. No scleral icterus.  Neck: Normal range of motion. Neck supple. No JVD present. No thyromegaly present.  Cardiovascular: Regular rhythm.  Occasional extrasystoles are present. Exam reveals no gallop.  Murmur heard.  Systolic murmur is present with a grade of 2/6.  Diastolic murmur is  present with a grade of 2/6. + loud humming  Pulmonary/Chest: Effort normal and breath sounds normal. She has no wheezes. She has no rales.  Abdominal: Soft. Bowel sounds are normal. She exhibits no mass. There is no hepatosplenomegaly. There is no tenderness. No hernia.  Musculoskeletal: Normal range of motion. She exhibits no edema, tenderness or deformity.  Neurological: She is alert and oriented to person, place, and time.  Skin: Skin is warm and dry. Rash noted. She is not diaphoretic. No erythema. No pallor.  Over the right lateral malleolus there is a patch of hyperpigmentation, scale, and lichenification that has an irregular border.  The total mid area measures about 3 x 4 cm.  There is no erythema or streaking.  Psychiatric: She has a normal Hanson and affect. Her behavior is normal. Thought content normal.  Vitals reviewed.   Lab Results  Component Value Date   WBC 4.8 01/19/2018   HGB 12.1 01/19/2018   HCT 38.9 01/19/2018   PLT 250 01/19/2018   GLUCOSE 111 (H) 01/19/2018   CHOL 171 03/04/2018   TRIG 105.0 03/04/2018   HDL 47.20 03/04/2018   LDLCALC 103 (H) 03/04/2018   ALT 14 11/17/2017   AST 26 11/17/2017   NA 140 01/19/2018   K 4.1 01/19/2018   CL 109 01/19/2018   CREATININE 0.92 01/19/2018   BUN 8 01/19/2018   CO2 21 (L) 01/19/2018   TSH 2.88 03/04/2018   INR 2.1 03/03/2018   HGBA1C 5.2 03/04/2018    No results found.  Assessment & Plan:   Dody was seen today for hypertension, hyperlipidemia, atrial fibrillation, congestive heart failure and rash.  Diagnoses and all orders for this visit:  Essential hypertension, benign- Her blood pressure is adequately well controlled. -     Thyroid Panel With TSH; Future  Persistent atrial fibrillation ([place])- She is maintaining rhythm and rate control.  Her TFTs are normal. -     Thyroid Panel With TSH; Future  Other abnormal glucose- Her blood sugars are normal now. -     Hemoglobin A1c; Future  Hyperlipidemia LDL  goal <130- Hhe has a low ASCVD risk score.  Statin therapy is not indicated. -     Lipid panel; Future -     Thyroid Panel With TSH; Future  Eczema of lower extremity -     Dermatological Products, Misc. [facility]) lotion; Apply 1 Act topically 2 (two) times daily with a meal.   I am having Brianna Hanson start on [facility]. I am also having her maintain her Vitamin D3, ferrous sulfate, furosemide, docusate sodium, hydrALAZINE, amLODipine, traMADol, losartan, KLOR-CON M20, carvedilol, pantoprazole, gabapentin, and warfarin.  Meds ordered this encounter  Medications  . Dermatological Products, Misc. [facility]) lotion    Sig: Apply 1 Act topically 2 (two) times daily with a meal.    Dispense:  225 g    Refill:  1     Follow-up: Return in about 3 months (around 06/04/2018).  Scarlette Calico, MD

## 2018-03-04 NOTE — Patient Instructions (Signed)
Stasis Dermatitis Stasis dermatitis is a long-term (chronic) skin condition that happens when veins can no longer pump blood back to the heart (poor circulation). This condition causes a red or brown scaly rash or sores (ulcers) from the pooling of blood (stasis). This condition usually affects the lower legs. It may affect one leg or both legs. Without treatment, severe stasis dermatitis can lead to other skin conditions and infections. What are the causes? This condition is caused by poor circulation. What increases the risk? This condition is more likely to develop in people who:  Are not very active.  Stand for long periods of time.  Have veins that have become enlarged and twisted (varicose veins).  Have leg veins that are not strong enough to send blood back to the heart (venous insufficiency).  Have had a blood clot.  Have been pregnant many times.  Have had vein surgery.  Are obese.  Have heart or kidney failure.  Are 50 years of age or older.  What are the signs or symptoms? Common early symptoms of this condition include:  Swelling in your ankle or leg. This might get better overnight but be worse again in the day.  Skin that looks thin on your ankle and leg.  Brown marks that develop slowly.  Skin that is easily irritated or cracked.  Red, swollen skin.  An achy or heavy feeling after you walk or stand for long periods of time.  Pain.  Later and more severe symptoms of this condition include:  Skin that looks shiny.  Small, open sores (ulcers). These are often red or purple.  Dry, cracking skin.  Skin that feels hard.  Severe itching.  A change in the shape or color of your lower legs.  Severe pain.  Difficulty walking.  How is this diagnosed? Your health care provider may suspect this condition from your symptoms and medical history. Your health care provider will also do a physical exam. You may need to see a health care provider who  specializes in skin diseases (dermatologist). You may also have tests to confirm the diagnosis, including:  Blood tests.  Imaging studies to check blood flow (Doppler ultrasound).  Allergy tests.  How is this treated? Treatment for this condition may include medicine, such as:  Corticosteroid creams and ointments.  Non-corticosteroid medicines applied to the skin (topical).  Medicine to reduce swelling in the legs (diuretics).  Antibiotics.  Medicine to relieve itching (antihistamines).  You may also have to wear:  Compression stockings or an elastic wrap to improve circulation.  A bandage (dressing).  A wrap that contains zinc and gelatin (Unna boot).  Follow these instructions at home: Skin Care  Moisturize your skin as told by your health care provider. Do not use moisturizers with fragrance. This can irritate your skin.  Apply cool compresses to the affected areas.  Do not scratch your skin.  Do not rub your skin dry after a bath or shower. Gently pat your skin dry.  Do not use scented soaps, detergents, or perfumes. Medicines  Take or use over-the-counter and prescription medicines only as told by your health care provider.  If you were prescribed an antibiotic medicine, take or use it as told by your health care provider. Do not stop taking or using the antibiotic even if your condition starts to improve. Lifestyle  Do not stand or sit in one position for long periods of time.  Do not cross your legs when you sit.  Raise (elevate) your   legs above the level of your heart when you are sitting or lying down.  Walk as told by your health care provider. Walking increases blood flow.  Wear comfortable, loose-fitting clothing. Circulation in your legs will be worse if you wear tight pants, belts, and waistbands. General instructions  Change and remove any dressing as told by your health care provider, if this applies.  Wear compression stockings as told by  your health care provider, if this applies. These stockings help to prevent blood clots and reduce swelling in your legs.  Wear the Unna boot as told by your health care provider, if this applies.  Keep all follow-up visits as told by your health care provider. This is important. Contact a health care provider if:  Your condition does not improve with treatment.  Your condition gets worse.  You have signs of infection in the affected area. Watch for: ? Swelling. ? Tenderness. ? Redness. ? Soreness. ? Warmth.  You have a fever. Get help right away if:  You notice red streaks coming from the affected area.  Your bone or joint underneath the affected area becomes painful after the skin has healed.  The affected area turns darker.  You feel a deep pain in your leg or groin.  You are short of breath. This information is not intended to replace advice given to you by your health care provider. Make sure you discuss any questions you have with your health care provider. Document Released: 11/28/2005 Document Revised: 04/16/2016 Document Reviewed: 01/04/2015 Elsevier Interactive Patient Education  2018 [employer]  

## 2018-03-10 ENCOUNTER — Ambulatory Visit (HOSPITAL_COMMUNITY): Payer: Self-pay | Admitting: Pharmacist

## 2018-03-10 LAB — POCT INR: INR: 1.7 — AB (ref 2.0–3.0)

## 2018-03-17 ENCOUNTER — Ambulatory Visit (HOSPITAL_COMMUNITY): Payer: Self-pay | Admitting: Pharmacist

## 2018-03-17 LAB — POCT INR: INR: 2.7 (ref 2.0–3.0)

## 2018-03-18 NOTE — Progress Notes (Addendum)
LVAD Clinic Note  Patient ID: Brianna Hanson, female   DOB: [birth year], 83 y.o.   MRN: [id number] Primary Heart Failure: Dr Haroldine Laws Cardiac Surgeon: Dr Prescott Gum  HPI: Brianna Hanson is a 83 y.o. 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 [name] (follwoed q 6 months). Underwent implantation of the HeartMate II LVAD on 04/06/13 for DT. Brianna Hanson is not on aspirin due to dizziness. S/P lap cholecystitis 10/2015.   GI Issues 09/2016 LGIB. Angio- no source of bleeding. LVAD speed turned down to 8400  LVAD Speed Change 12/2016 Ramp ECHO -->Speed increased from 8400>8800  Brianna Hanson presents today for regular follow up. Brianna Hanson is doing well overall. Brianna Hanson has had 2 low flow alarms, but this has been a chronic occurrence and is asymptomatic. Brianna Hanson is following with her PCP, Dr. Ronnald Ramp for a psoriatic/exzematous lesion on her R ankle. Has tried a cream with no benefit yet. Brianna Hanson denies SOB, continues to walk. Brianna Hanson has not needed any lasix. Denies lightheadedness, dizziness., bleeding, melena, or neurological symptoms. Taking all medications as directed.   VAD Indication: Destination Therapy - Age excluding and patient choice.   VAD interrogation & Equipment Management: Speed: 8600 Flow: 4.7 Power: 4.6 w PI: 6.3  Alarms: 2 LF events. Events: 10-20 PI daily  Fixed speed 8600 Low speed limit: 8000  Primary back up battery replacement due in 30 months.   Past Medical History:  Diagnosis Date  . AICD (automatic cardioverter/defibrillator) present   . Anemia   . Atrial fibrillation or flutter   . Cardiac arrest - ventricular fibrillation 12/10   with successful resucitation, S/p ICD  . CHF (congestive heart failure) ([place])   . Diverticula of colon 2011  . HTN (hypertension)    moderate  . ICD (implantable cardiac defibrillator) in place    Brianna Hanson has received appropriate therapy for VF  . Internal and external hemorrhoids  without complication 9622  . Nonischemic cardiomyopathy ([place])    followed by Dr April Holding at [facility]  . Osteopenia   . Plasma cell disorder 03/20/2012  . Plasma cell disorder 03/20/2012  . Sessile colonic polyp 2011   Dr Benson [place]    Current Outpatient Medications  Medication Sig Dispense Refill  . amLODipine (NORVASC) 10 MG tablet Take one tab daily at hour of sleep 30 tablet 6  . carvedilol (COREG) 6.25 MG tablet TAKE 1 TABLET (6.25 MG TOTAL) BY MOUTH 2 (TWO) TIMES DAILY WITH A MEAL. 60 tablet 11  . Cholecalciferol (VITAMIN D3) 1000 UNITS CAPS Take 1 tablet by mouth daily.      . Dermatological Products, Misc. [facility]) lotion Apply 1 Act topically 2 (two) times daily with a meal. 225 g 1  . docusate sodium (COLACE) 100 MG capsule Take 100 mg by mouth daily.    . ferrous sulfate 325 (65 FE) MG tablet Take 1 tablet (325 mg total) by mouth 2 (two) times daily with a meal. 60 tablet 6  . gabapentin (NEURONTIN) 100 MG capsule TAKE 1 CAPSULE BY MOUTH EVERYDAY AT BEDTIME 90 capsule 3  . hydrALAZINE (APRESOLINE) 100 MG tablet Take 1 tablet (100 mg total) by mouth 3 (three) times daily. 360 tablet 6  . KLOR-CON M20 20 MEQ tablet TAKE 1 TABLET BY MOUTH TWICE A DAY 60 tablet 10  . losartan (COZAAR) 100 MG tablet Take 1 tablet (100 mg total) by mouth daily. 180 tablet  3  . pantoprazole (PROTONIX) 40 MG tablet TAKE 1 TABLET BY MOUTH EVERY DAY 30 tablet 6  . traMADol (ULTRAM) 50 MG tablet Take 2 tablets (100 mg total) by mouth every 12 (twelve) hours as needed for moderate pain. 60 tablet 3  . furosemide (LASIX) 20 MG tablet Take 1 tablet (20 mg total) by mouth daily as needed (Only when recommended by CHF clinic.). (Patient not taking: Reported on 03/19/2018) 30 tablet 6  . warfarin (COUMADIN) 2.5 MG tablet 5 mg daily at 6 PM. Take 2 tablets (5 mg) daily except 1 and 1/2 tablets (3.75 mg ) on Tue     No current facility-administered medications for this encounter.    No Known Allergies  Review of systems  complete and found to be negative unless listed in HPI.    Vitals:   03/19/18 1002  BP: 115/78  Pulse: 72  SpO2: 95%  Weight: 180 lb 9.6 oz (81.9 kg)  Height: 5' (1.524 m)    Vital Signs:  Doppler Pressure: 74 Automatc BP: 115/78 (85) HR: 72 SPO2: 95  Weight: 180.6 lbs w/o eqt Last weight: 186.6 lbs   Physical Exam: General: Well appearing this am. NAD.  HEENT: Normal. Neck: Supple, JVP 7-8 cm. Carotids OK.  Cardiac:  Mechanical heart sounds with LVAD hum present.  Lungs:  CTAB, normal effort.  Abdomen:  NT, ND, no HSM. No bruits or masses. +BS  LVAD exit site: Well-healed and incorporated. Dressing dry and intact. No erythema or drainage. Stabilization device present and accurately applied. Driveline dressing changed daily per sterile technique. Extremities:  Warm and dry. No cyanosis, clubbing, rash, or edema.  Neuro:  Alert & oriented x 3. Cranial nerves grossly intact. Moves all 4 extremities w/o difficulty. Affect pleasant     ASSESSMENT AND PLAN:  1) Chronic systolic HF: NICM, s/p ICD and LVAD for DT(04/2013).  - Continues to do very well with VAD support.  - NYHA II symptoms - Volume status stable on exam.   - Stable off lasix. Unable to tolerate Entresto with low flow alarms - Has had occasional low flows but improves with fluid and salt. Can add florinef or midodrine as needed  2) LVAD placed for DT 04/2013:  - Failed Entresto due to diuresis and incessant low flow alarms.  - Continue losartan 100 mg daily. MAP stable.  - No ASA with GI bleed and intolerance.  - Continue coumadin. No bleeding problems. - LDH 247 - INR 2.28. Discussed dosing with PharmD personally.  - VAD interrogated personally. Parameters stable.   3) PAF:  - Remains in NSR.  - Off amiodarone.  4) HTN:  - Meds and MAPs as above.  5) Chronic Anticoagulation:  - As above.  - Goal INR 1.8-2.3. INR 2.28. Discussed dosing with PharmD personally.   Brianna Friar, PA-C 10:49 AM    Greater than 50% of the 30 minute visit was spent in counseling/coordination of care regarding disease state education, salt/fluid restriction, sliding scale diuretics, and medication compliance.

## 2018-03-19 ENCOUNTER — Ambulatory Visit (HOSPITAL_COMMUNITY)
Admission: RE | Admit: 2018-03-19 | Discharge: 2018-03-19 | Disposition: A | Discharge status: Home / Self Care | Payer: Medicare Other | Source: Ambulatory Visit | Attending: Cardiology | Admitting: Cardiology

## 2018-03-19 ENCOUNTER — Other Ambulatory Visit (HOSPITAL_COMMUNITY): Payer: Self-pay | Admitting: *Deleted

## 2018-03-19 VITALS — BP 115/78 | HR 72 | Ht 60.0 in | Wt 180.6 lb

## 2018-03-19 DIAGNOSIS — I481 Persistent atrial fibrillation: Secondary | ICD-10-CM | POA: Diagnosis not present

## 2018-03-19 DIAGNOSIS — Z79899 Other long term (current) drug therapy: Secondary | ICD-10-CM | POA: Diagnosis not present

## 2018-03-19 DIAGNOSIS — Z95811 Presence of heart assist device: Secondary | ICD-10-CM

## 2018-03-19 DIAGNOSIS — I428 Other cardiomyopathies: Secondary | ICD-10-CM | POA: Insufficient documentation

## 2018-03-19 DIAGNOSIS — I4819 Other persistent atrial fibrillation: Secondary | ICD-10-CM

## 2018-03-19 DIAGNOSIS — Z86010 Personal history of colonic polyps: Secondary | ICD-10-CM | POA: Diagnosis not present

## 2018-03-19 DIAGNOSIS — I110 Hypertensive heart disease with heart failure: Secondary | ICD-10-CM | POA: Insufficient documentation

## 2018-03-19 DIAGNOSIS — D649 Anemia, unspecified: Secondary | ICD-10-CM | POA: Diagnosis not present

## 2018-03-19 DIAGNOSIS — I5043 Acute on chronic combined systolic (congestive) and diastolic (congestive) heart failure: Secondary | ICD-10-CM

## 2018-03-19 DIAGNOSIS — M8580 Other specified disorders of bone density and structure, unspecified site: Secondary | ICD-10-CM | POA: Diagnosis not present

## 2018-03-19 DIAGNOSIS — Z7901 Long term (current) use of anticoagulants: Secondary | ICD-10-CM | POA: Diagnosis not present

## 2018-03-19 DIAGNOSIS — I5022 Chronic systolic (congestive) heart failure: Secondary | ICD-10-CM | POA: Diagnosis not present

## 2018-03-19 DIAGNOSIS — Z8674 Personal history of sudden cardiac arrest: Secondary | ICD-10-CM | POA: Insufficient documentation

## 2018-03-19 DIAGNOSIS — I480 Paroxysmal atrial fibrillation: Secondary | ICD-10-CM | POA: Insufficient documentation

## 2018-03-19 DIAGNOSIS — I10 Essential (primary) hypertension: Secondary | ICD-10-CM | POA: Diagnosis not present

## 2018-03-19 LAB — BASIC METABOLIC PANEL
Anion gap: 8 (ref 5–15)
BUN: 10 mg/dL (ref 8–23)
CO2: 20 mmol/L — ABNORMAL LOW (ref 22–32)
Calcium: 9.2 mg/dL (ref 8.9–10.3)
Chloride: 110 mmol/L (ref 98–111)
Creatinine, Ser: 0.9 mg/dL (ref 0.44–1.00)
GFR calc Af Amer: >60 mL/min (ref >60)
GFR calc non Af Amer: 58 mL/min — ABNORMAL LOW (ref >60)
Glucose, Bld: 105 mg/dL — ABNORMAL HIGH (ref 70–99)
Potassium: 3.6 mmol/L (ref 3.5–5.1)
Sodium: 138 mmol/L (ref 135–145)

## 2018-03-19 LAB — CBC
HCT: 39.6 % (ref 36.0–46.0)
Hemoglobin: 12.3 g/dL (ref 12.0–15.0)
MCH: 28.7 pg (ref 26.0–34.0)
MCHC: 31.1 g/dL (ref 30.0–36.0)
MCV: 92.5 fL (ref 78.0–100.0)
Platelets: 232 10*3/uL (ref 150–400)
RBC: 4.28 MIL/uL (ref 3.87–5.11)
RDW: 15.6 % — ABNORMAL HIGH (ref 11.5–15.5)
WBC: 5.8 10*3/uL (ref 4.0–10.5)

## 2018-03-19 LAB — LACTATE DEHYDROGENASE: LDH: 247 U/L — ABNORMAL HIGH (ref 98–192)

## 2018-03-19 LAB — PROTIME-INR
INR: 2.28
Prothrombin Time: 24.9 seconds — ABNORMAL HIGH (ref 11.4–15.2)

## 2018-03-19 NOTE — Progress Notes (Signed)
Patient presents for 2 month  follow up in [facility] today. Reports no problems with VAD equipment or concerns with drive line.  Vital Signs:  Doppler Pressure:  74 Automatc BP:  115/78 (88) HR:  72 SPO2:  95%  Weight: 180.6 lb w/o eqt Last weight: 183.6 lbs  VAD Indication: Destination Therapy - age excluding and patient choice    VAD interrogation & Equipment Management: Speed:8600 Flow: 4.7 Power: 4.6 w    PI: 6.3  Alarms: 2 low flows on 7/14 and 1 low flow on 7/12. Events: 5 - 20 PI daily  Fixed speed 8600 Low speed limit: 8000  Primary Controller: Replace back up battery in 30 months. Back up controller: Replace back up battery in 25 months.  Exit Site Care: Drive line is being maintained weekly by [employer]. Drive line exit site well healed and incorporated. The velour is fully implanted at exit site. Dressing dry and intact. No erythema or drainage. Stabilization device present and accurately applied. Pt denies fever or chills. Gave pt 9 dressing kits for home use.   Significant Events on VAD Support:  09/21/16 GIB, Ramp echo speed decreased to 8400  01/08/17 - LOW FLOW alarms; speed increased to 8800 RPM 05/20/17> speed decreased to 8600  Device:St Jude ICD Therapies: on at 200 bpm; monitor on 110 DDD 60 Last check: 11/16/17  BP & Labs: Doppler BP - 74 is reflecting Modified systolic  Hgb 28.7 - No S/S of bleeding. Specifically denies melena/BRBPR or nosebleeds.  LDH 247  -  Within the range of  150 - 300. Denies any dark urine.   Patient Instructions: 1. No change in meds. 2. Return to [place] [facility] in 2 months for annual maintenance and intermacs.   Tanda Rockers RN [place] Coordinator   Office: [phone] 24/7 Emergency VAD Pager: [phone]

## 2018-03-24 ENCOUNTER — Ambulatory Visit (HOSPITAL_COMMUNITY): Payer: Self-pay | Admitting: Pharmacist

## 2018-03-24 DIAGNOSIS — Z7901 Long term (current) use of anticoagulants: Secondary | ICD-10-CM | POA: Diagnosis not present

## 2018-03-24 LAB — POCT INR: INR: 2.5 (ref 2–3)

## 2018-03-27 ENCOUNTER — Other Ambulatory Visit (HOSPITAL_COMMUNITY): Payer: Self-pay | Admitting: Adult Health

## 2018-03-27 DIAGNOSIS — I10 Essential (primary) hypertension: Secondary | ICD-10-CM

## 2018-03-31 ENCOUNTER — Ambulatory Visit (HOSPITAL_COMMUNITY): Payer: Self-pay | Admitting: Pharmacist

## 2018-03-31 LAB — POCT INR: INR: 2.6 (ref 2–3)

## 2018-04-02 ENCOUNTER — Other Ambulatory Visit: Payer: Self-pay

## 2018-04-07 ENCOUNTER — Ambulatory Visit (HOSPITAL_COMMUNITY): Payer: Self-pay | Admitting: Pharmacist

## 2018-04-07 LAB — POCT INR: INR: 2.5 (ref 2–3)

## 2018-04-14 ENCOUNTER — Ambulatory Visit (HOSPITAL_COMMUNITY): Payer: Self-pay | Admitting: Pharmacist

## 2018-04-14 LAB — POCT INR: INR: 2.4 (ref 2.0–3.0)

## 2018-04-21 ENCOUNTER — Ambulatory Visit (HOSPITAL_COMMUNITY): Payer: Self-pay | Admitting: Pharmacist

## 2018-04-21 DIAGNOSIS — Z7901 Long term (current) use of anticoagulants: Secondary | ICD-10-CM | POA: Diagnosis not present

## 2018-04-21 LAB — POCT INR: INR: 2.3 (ref 2.0–3.0)

## 2018-04-28 LAB — POCT INR: INR: 2.9 (ref 2.0–3.0)

## 2018-04-30 ENCOUNTER — Ambulatory Visit (HOSPITAL_COMMUNITY): Payer: Self-pay | Admitting: Pharmacist

## 2018-05-05 ENCOUNTER — Ambulatory Visit (INDEPENDENT_AMBULATORY_CARE_PROVIDER_SITE_OTHER): Payer: Medicare Other | Admitting: *Deleted

## 2018-05-05 ENCOUNTER — Ambulatory Visit (HOSPITAL_COMMUNITY): Payer: Self-pay | Admitting: Pharmacist

## 2018-05-05 DIAGNOSIS — I4901 Ventricular fibrillation: Secondary | ICD-10-CM | POA: Diagnosis not present

## 2018-05-05 DIAGNOSIS — I5043 Acute on chronic combined systolic (congestive) and diastolic (congestive) heart failure: Secondary | ICD-10-CM

## 2018-05-05 LAB — POCT INR: INR: 2.2 (ref 2.0–3.0)

## 2018-05-05 NOTE — Progress Notes (Signed)
Remote ICD transmission.   

## 2018-05-12 ENCOUNTER — Ambulatory Visit (HOSPITAL_COMMUNITY): Payer: Self-pay | Admitting: Pharmacist

## 2018-05-12 LAB — POCT INR: INR: 2.5 (ref 2.0–3.0)

## 2018-05-16 ENCOUNTER — Other Ambulatory Visit (HOSPITAL_COMMUNITY): Payer: Self-pay | Admitting: *Deleted

## 2018-05-16 DIAGNOSIS — Z7901 Long term (current) use of anticoagulants: Secondary | ICD-10-CM

## 2018-05-16 DIAGNOSIS — Z0000 Encounter for general adult medical examination without abnormal findings: Secondary | ICD-10-CM

## 2018-05-16 DIAGNOSIS — Z789 Other specified health status: Secondary | ICD-10-CM

## 2018-05-16 DIAGNOSIS — I5043 Acute on chronic combined systolic (congestive) and diastolic (congestive) heart failure: Secondary | ICD-10-CM

## 2018-05-16 DIAGNOSIS — Z95811 Presence of heart assist device: Secondary | ICD-10-CM

## 2018-05-18 ENCOUNTER — Other Ambulatory Visit (HOSPITAL_COMMUNITY): Payer: Self-pay | Admitting: *Deleted

## 2018-05-18 DIAGNOSIS — I5043 Acute on chronic combined systolic (congestive) and diastolic (congestive) heart failure: Secondary | ICD-10-CM

## 2018-05-18 DIAGNOSIS — Z95811 Presence of heart assist device: Secondary | ICD-10-CM

## 2018-05-18 DIAGNOSIS — Z0000 Encounter for general adult medical examination without abnormal findings: Secondary | ICD-10-CM

## 2018-05-19 ENCOUNTER — Telehealth: Payer: Self-pay | Admitting: *Deleted

## 2018-05-19 ENCOUNTER — Ambulatory Visit (HOSPITAL_COMMUNITY)
Admission: RE | Admit: 2018-05-19 | Discharge: 2018-05-19 | Disposition: A | Discharge status: Home / Self Care | Payer: Medicare Other | Source: Ambulatory Visit | Attending: Internal Medicine | Admitting: Internal Medicine

## 2018-05-19 ENCOUNTER — Encounter (HOSPITAL_COMMUNITY): Payer: Self-pay

## 2018-05-19 ENCOUNTER — Ambulatory Visit (HOSPITAL_COMMUNITY): Payer: Self-pay | Admitting: Pharmacist

## 2018-05-19 DIAGNOSIS — I110 Hypertensive heart disease with heart failure: Secondary | ICD-10-CM | POA: Diagnosis not present

## 2018-05-19 DIAGNOSIS — Z7901 Long term (current) use of anticoagulants: Secondary | ICD-10-CM | POA: Diagnosis not present

## 2018-05-19 DIAGNOSIS — Z0000 Encounter for general adult medical examination without abnormal findings: Secondary | ICD-10-CM | POA: Diagnosis not present

## 2018-05-19 DIAGNOSIS — Z79899 Other long term (current) drug therapy: Secondary | ICD-10-CM | POA: Insufficient documentation

## 2018-05-19 DIAGNOSIS — Z95810 Presence of automatic (implantable) cardiac defibrillator: Secondary | ICD-10-CM | POA: Insufficient documentation

## 2018-05-19 DIAGNOSIS — I429 Cardiomyopathy, unspecified: Secondary | ICD-10-CM | POA: Insufficient documentation

## 2018-05-19 DIAGNOSIS — I5022 Chronic systolic (congestive) heart failure: Secondary | ICD-10-CM | POA: Diagnosis not present

## 2018-05-19 DIAGNOSIS — I5043 Acute on chronic combined systolic (congestive) and diastolic (congestive) heart failure: Secondary | ICD-10-CM

## 2018-05-19 DIAGNOSIS — I480 Paroxysmal atrial fibrillation: Secondary | ICD-10-CM | POA: Diagnosis not present

## 2018-05-19 DIAGNOSIS — Z95811 Presence of heart assist device: Secondary | ICD-10-CM | POA: Insufficient documentation

## 2018-05-19 DIAGNOSIS — Z8674 Personal history of sudden cardiac arrest: Secondary | ICD-10-CM | POA: Diagnosis not present

## 2018-05-19 LAB — CBC
HCT: 39.7 % (ref 36.0–46.0)
Hemoglobin: 12.1 g/dL (ref 12.0–15.0)
MCH: 29.4 pg (ref 26.0–34.0)
MCHC: 30.5 g/dL (ref 30.0–36.0)
MCV: 96.6 fL (ref 78.0–100.0)
Platelets: 221 10*3/uL (ref 150–400)
RBC: 4.11 MIL/uL (ref 3.87–5.11)
RDW: 15.8 % — ABNORMAL HIGH (ref 11.5–15.5)
WBC: 5.7 10*3/uL (ref 4.0–10.5)

## 2018-05-19 LAB — COMPREHENSIVE METABOLIC PANEL
ALT: 14 U/L (ref 0–44)
AST: 23 U/L (ref 15–41)
Albumin: 3.2 g/dL — ABNORMAL LOW (ref 3.5–5.0)
Alkaline Phosphatase: 91 U/L (ref 38–126)
Anion gap: 6 (ref 5–15)
BUN: 8 mg/dL (ref 8–23)
CO2: 22 mmol/L (ref 22–32)
Calcium: 8.9 mg/dL (ref 8.9–10.3)
Chloride: 110 mmol/L (ref 98–111)
Creatinine, Ser: 0.89 mg/dL (ref 0.44–1.00)
GFR calc Af Amer: >60 mL/min (ref >60)
GFR calc non Af Amer: 59 mL/min — ABNORMAL LOW (ref >60)
Glucose, Bld: 98 mg/dL (ref 70–99)
Potassium: 4.3 mmol/L (ref 3.5–5.1)
Sodium: 138 mmol/L (ref 135–145)
Total Bilirubin: 0.5 mg/dL (ref 0.3–1.2)
Total Protein: 8 g/dL (ref 6.5–8.1)

## 2018-05-19 LAB — LIPID PANEL
Cholesterol: 160 mg/dL (ref 0–200)
HDL: 44 mg/dL (ref >40)
LDL Cholesterol: 78 mg/dL (ref 0–99)
Total CHOL/HDL Ratio: 3.6 RATIO
Triglycerides: 189 mg/dL — ABNORMAL HIGH (ref <150)
VLDL: 38 mg/dL (ref 0–40)

## 2018-05-19 LAB — PROTIME-INR
INR: 2.56
Prothrombin Time: 27.3 seconds — ABNORMAL HIGH (ref 11.4–15.2)

## 2018-05-19 LAB — PREALBUMIN: Prealbumin: 14.9 mg/dL — ABNORMAL LOW (ref 18–38)

## 2018-05-19 LAB — LACTATE DEHYDROGENASE: LDH: 238 U/L — ABNORMAL HIGH (ref 98–192)

## 2018-05-19 NOTE — Telephone Encounter (Signed)
Alert printed for VF episode treated with ATP.  [email or web address]

## 2018-05-19 NOTE — Progress Notes (Signed)
LVAD Clinic Note  Patient ID: Brianna Hanson, female   DOB: 10/18/1934, 83 y.o.   MRN: [id number] Primary Heart Failure: Dr Haroldine Laws Cardiac Surgeon: Dr Prescott Gum  HPI: Brianna Hanson is a 83 y.o. 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 [name] (follwoed q 6 months). Underwent implantation of the HeartMate II LVAD on 04/06/13 for DT. She is not on aspirin due to dizziness. S/P lap cholecystitis 10/2015.   GI Issues 09/2016 LGIB. Angio- no source of bleeding. LVAD speed turned down to 8400  LVAD Speed Change 12/2016 Ramp ECHO -->Speed increased from 8400>8800  She presents today for regular follow up, and her 5 year assessment. She is doing great. She had 1 low flow alarm on 05/08/18, but felt like she hadn't taken in enough water that day. She continues to walk with the VAD group weekly, and remains very active daily. He denies lightheadedness, dizziness, bleeding, melena, or neuro symptoms. No edema, orthopnea or PND. Taking all medications as directed.    VAD Indication: Destination Therapy - Age excluding and patient choice.   VAD interrogation & Equipment Management: Speed: 8600 Flow: 4.0 Power: 4.3 w PI: 6.3  Alarms: 1 LF event 05/08/18, pt states she didn't have enough water that day.  Events: 10-20 daily.   Fixed speed 8600 Low speed limit: 8000  Primary back up battery replacement due in 28 months.  Secondary back up battery replacement due in 23 months.  Past Medical History:  Diagnosis Date  . AICD (automatic cardioverter/defibrillator) present   . Anemia   . Atrial fibrillation or flutter   . Cardiac arrest - ventricular fibrillation 12/10   with successful resucitation, S/p ICD  . CHF (congestive heart failure) ([place])   . Diverticula of colon 2011  . HTN (hypertension)    moderate  . ICD (implantable cardiac defibrillator) in place    she has received appropriate therapy for  VF  . Internal and external hemorrhoids without complication 9678  . Nonischemic cardiomyopathy ([place])    [facility] Dr [facility] at [facility]  . Osteopenia   . Plasma cell disorder 03/20/2012  . Plasma cell disorder 03/20/2012  . Sessile colonic polyp 2011   Dr Benson [place]    Current Outpatient Medications  Medication Sig Dispense Refill  . amLODipine (NORVASC) 10 MG tablet TAKE 1 TABLET BY MOUTH DAILY AT HOUR OF SLEEP 90 tablet 3  . carvedilol (COREG) 6.25 MG tablet TAKE 1 TABLET (6.25 MG TOTAL) BY MOUTH 2 (TWO) TIMES DAILY WITH A MEAL. 60 tablet 11  . Cholecalciferol (VITAMIN D3) 1000 UNIT/SPRAY LIQD Take by mouth.    . docusate sodium (COLACE) 100 MG capsule Take 100 mg by mouth daily.    . ferrous sulfate 325 (65 FE) MG tablet Take 1 tablet (325 mg total) by mouth 2 (two) times daily with a meal. 60 tablet 6  . gabapentin (NEURONTIN) 100 MG capsule TAKE 1 CAPSULE BY MOUTH EVERYDAY AT BEDTIME 90 capsule 3  . hydrALAZINE (APRESOLINE) 100 MG tablet Take 1 tablet (100 mg total) by mouth 3 (three) times daily. 360 tablet 6  . KLOR-CON M20 20 MEQ tablet TAKE 1 TABLET BY MOUTH TWICE A DAY 60 tablet 10  . pantoprazole (PROTONIX) 40 MG tablet TAKE 1 TABLET BY MOUTH EVERY DAY 30 tablet 6  . traMADol (ULTRAM) 50 MG tablet Take 2 tablets (100 mg total) by  mouth every 12 (twelve) hours as needed for moderate pain. 60 tablet 3  . warfarin (COUMADIN) 2.5 MG tablet Take 5 mg by mouth daily at 6 PM.     . furosemide (LASIX) 20 MG tablet Take 1 tablet (20 mg total) by mouth daily as needed (Only when recommended by CHF clinic.). (Patient not taking: Reported on 03/19/2018) 30 tablet 6  . losartan (COZAAR) 100 MG tablet Take 1 tablet (100 mg total) by mouth daily. 180 tablet 3   No current facility-administered medications for this encounter.    No Known Allergies  Review of systems complete and found to be negative unless listed in HPI.    Vitals:   05/19/18 1019 05/19/18 1022  BP: (!) 104/0 116/80  Pulse:   84  SpO2:  96%  Weight:  82.1 kg (181 lb)  Height:  5' (1.524 m)    Vital Signs:  Doppler Pressure: 104 Automatc BP: 116/80 (89) HR: 84 SPO2: 96%  Weight: 181 lb w/o eqt Last weight: 180.6 lbs   Physical Exam: General: Well appearing this am. NAD.  HEENT: Normal. Anicteric  Neck: Supple, JVP 7-8 cm. Carotids OK.  Cardiac:  Mechanical heart sounds with LVAD hum present.  Lungs:  CTAB, normal effort. No wheeze  Abdomen:  NT, ND, no HSM. No bruits or masses. +BS  LVAD exit site: Well-healed and incorporated. Dressing dry and intact. No erythema or drainage. Stabilization device present and accurately applied. Driveline dressing changed daily per sterile technique. Extremities: no cyanosis, clubbing, rash, edema Neuro: alert & oriented x 3, cranial nerves grossly intact. moves all 4 extremities w/o difficulty. Affect pleasant   ASSESSMENT AND PLAN:  1) Chronic systolic HF: NICM, s/p ICD and LVAD for DT(04/2013).  - 5 years out, she is doing very well with VAD support.  - NYHA II symptoms. - Volume status stable on exam  - Stable off lasix. Unable to tolerate Entresto with low flow alarms - Has occasional low flows but improves with fluid and salt. Can add florinef or midodrine as needed  2) LVAD placed for DT 04/2013:  - Failed Entresto due to diuresis and incessant low flow alarms.  - Continue losartan 100 mg daily. MAP stable.  - No ASA with GI bleed and intolerance.  - Continue coumadin. Denies bleeding. - LDH pending  - INR 2.56. Discussed dosing with PharmD personally.  - VAD interrogated personally. Parameters stable.   3) PAF:  - Remains in NSR.  - Off amiodarone.  4) HTN:  - Meds and MAPs as above.  5) Chronic Anticoagulation:  - As above.   - Goal INR 1.8-2.3.  - INR 2.56. Discussed with PharmD personally.   Shirley Friar, PA-C 10:30 AM   Patient seen and examined with the above-signed Advanced Practice Provider and/or Housestaff. I personally  reviewed laboratory data, imaging studies and relevant notes. I independently examined the patient and formulated the important aspects of the plan. I have edited the note to reflect any of my changes or salient points. I have personally discussed the plan with the patient and/or family.  She is doing great 5 years out on VAD therapy. Still with occasional low flow alarms but managing well wen she keeps her fluid up. MAPs ok. INR 2.56. Discussed dosing with PharmD personally. LDH pending. Continue current regimen. VAD interrogated personally. Parameters stable.  Glori Bickers, MD  11:17 AM

## 2018-05-19 NOTE — Progress Notes (Addendum)
Patient presents for 2 month  follow up in [facility] today with caregiver, [name].  Reports no problems with VAD equipment or concerns with drive line.  Pt had one brief low flow alarm on 05/18/18, says she was not symptomatic. Drank extra fluid after that with no further alarms noted. She has not need prn Lasix.   Pt walking 1 - 1.4 miles weekly. She reports no limitations to daily activities.   5 year Intermacs f/u and annual maintenance today.   Vital Signs:  Doppler Pressure:  104 Automatc BP:  116/80 (89) HR:  84 SPO2:  96 %  Weight: 181 lb w/o eqt Last weight: 180.6 lbs  VAD Indication: Destination Therapy - age excluding and patient choice    VAD interrogation & Equipment Management: Speed:8600 Flow: 4.0  Power: 4.3 w    PI: 6.2  Alarms: 5 - 20 PI events; 1 low flow on 05/18/18 Events: 5 - 20 PI daily  Fixed speed 8600 Low speed limit: 8000  Primary Controller: Replace back up battery in 28 months. Back up controller: Replace back up battery in 23 months.  Exit Site Care: Drive line is being maintained weekly by [employer]. Drive line exit site well healed and incorporated. The velour is fully implanted at exit site. Dressing dry and intact. No erythema or drainage. Stabilization device present and accurately applied. Pt denies fever or chills. Gave pt 9 dressing kits for home use.   Significant Events on VAD Support:  09/21/16 GIB, Ramp echo speed decreased to 8400  01/08/17 - LOW FLOW alarms; speed increased to 8800 RPM 05/20/17> speed decreased to 8600  Device:St Jude ICD Therapies: on at 200 bpm; monitor on 110 DDD 60 Last check: 11/16/17  BP & Labs: Doppler BP - 104  is reflecting Modified systolic  Hgb 86.7 - No S/S of bleeding. Specifically denies melena/BRBPR or nosebleeds.  LDH 238 - Within the range of  150 - 300. Denies any dark urine.   Batteries Manufacture Date: Number of uses: Re-calibration  12/08/15 138 - 141 Performed by patient   Annual  maintenance completed per [employer] on patient's home power module and universal [employer] fundraiser.     Intermacs follow up completed including:  Quality of Life, KCCQ-12. Pt unable to complete Neurocognitive trail making.   Pt completed 700 feet during 6 minute walk.  Backup system controller 11 volt battery charged during visit.   Patient Instructions: 1. No change in meds. 2. Return to [place] clinic in 2 months.  Zada Girt  RN [place] Coordinator   Office: [phone] 24/7 Emergency VAD Pager: [phone]

## 2018-05-20 NOTE — Telephone Encounter (Signed)
Spoke with pt, pt asymptomatic pts stated that she was in bed asleep at that time, pt reported compliance with medications and aware of driving restrictions x 6 months.

## 2018-05-20 NOTE — Addendum Note (Signed)
Encounter addended by: Lezlie Octave, RN on: 05/20/2018 10:20 AM  Actions taken: Sign clinical note

## 2018-05-20 NOTE — Telephone Encounter (Signed)
Patient returning call.

## 2018-05-22 ENCOUNTER — Other Ambulatory Visit (HOSPITAL_COMMUNITY): Payer: Self-pay | Admitting: *Deleted

## 2018-05-26 ENCOUNTER — Ambulatory Visit (HOSPITAL_COMMUNITY): Payer: Self-pay | Admitting: Pharmacist

## 2018-05-26 LAB — POCT INR: INR: 2.8 (ref 2.0–3.0)

## 2018-05-31 ENCOUNTER — Other Ambulatory Visit (HOSPITAL_COMMUNITY): Payer: Self-pay | Admitting: Internal Medicine

## 2018-06-01 LAB — CUP PACEART REMOTE DEVICE CHECK
Battery Remaining Longevity: 22 mo
Battery Remaining Percentage: 22 %
Battery Voltage: 2.78 V
Brady Statistic AP VP Percent: 1 %
Brady Statistic AP VS Percent: 9.6 %
Brady Statistic AS VP Percent: 1 %
Brady Statistic AS VS Percent: 82 %
Brady Statistic RA Percent Paced: 1.9 %
Brady Statistic RV Percent Paced: 1 %
Date Time Interrogation Session: 20190903072717
HighPow Impedance: 45 Ohm
Implantable Lead Implant Date: 20110104
Implantable Lead Implant Date: 20110104
Implantable Lead Location: 753859
Implantable Lead Location: 753860
Implantable Lead Model: 7121
Implantable Pulse Generator Implant Date: 20110104
Lead Channel Impedance Value: 300 Ohm
Lead Channel Impedance Value: 310 Ohm
Lead Channel Pacing Threshold Amplitude: 0.75 V
Lead Channel Pacing Threshold Amplitude: 0.75 V
Lead Channel Pacing Threshold Pulse Width: 0.5 ms
Lead Channel Pacing Threshold Pulse Width: 0.5 ms
Lead Channel Sensing Intrinsic Amplitude: 0.7 mV
Lead Channel Sensing Intrinsic Amplitude: 9.6 mV
Lead Channel Setting Pacing Amplitude: 2 V
Lead Channel Setting Pacing Amplitude: 2.5 V
Lead Channel Setting Pacing Pulse Width: 0.5 ms
Lead Channel Setting Sensing Sensitivity: 0.5 mV
Pulse Gen Serial Number: 754815

## 2018-06-02 ENCOUNTER — Ambulatory Visit (HOSPITAL_COMMUNITY): Payer: Self-pay | Admitting: Pharmacist

## 2018-06-02 LAB — POCT INR: INR: 2.4 (ref 2.0–3.0)

## 2018-06-09 ENCOUNTER — Ambulatory Visit (HOSPITAL_COMMUNITY): Payer: Self-pay | Admitting: Pharmacist

## 2018-06-09 LAB — POCT INR: INR: 2.2 (ref 2.0–3.0)

## 2018-06-14 ENCOUNTER — Other Ambulatory Visit (HOSPITAL_COMMUNITY): Payer: Self-pay | Admitting: Internal Medicine

## 2018-06-16 ENCOUNTER — Ambulatory Visit (HOSPITAL_COMMUNITY): Payer: Self-pay | Admitting: Pharmacist

## 2018-06-16 DIAGNOSIS — Z7901 Long term (current) use of anticoagulants: Secondary | ICD-10-CM | POA: Diagnosis not present

## 2018-06-16 LAB — POCT INR: INR: 2.3 (ref 2.0–3.0)

## 2018-06-18 NOTE — Progress Notes (Addendum)
Subjective:   Brianna Hanson is a 83 y.o. female who presents for an Initial Medicare Annual Wellness Visit.  Review of Systems    No ROS.  Medicare Wellness Visit. Additional risk factors are reflected in the social history.  Cardiac Risk Factors include: advanced age (>30men, >35 women);dyslipidemia;hypertension Sleep patterns: feels rested on waking, gets up 1-2 times nightly to void and sleeps 8-9 hours nightly.    Home Safety/Smoke Alarms: Feels safe in home. Smoke alarms in place.  Living environment; residence and Firearm Safety: 1-story house/ trailer, no firearms. Lives with partner, no needs for DME, good support system Seat Belt Safety/Bike Helmet: Wears seat belt.    Objective:    Today's Vitals   06/22/18 0913  BP: 116/82  Pulse: 72  Resp: 18  Temp: 97.8 F (36.6 C)  SpO2: 98%  Weight: 181 lb (82.1 kg)  Height: 5' (1.524 m)   Body mass index is 35.35 kg/m.  Advanced Directives 06/22/2018 05/27/2017 05/20/2017 12/09/2016 11/19/2016 10/29/2016 09/21/2016  Does Patient Have a Medical Advance Directive? Yes No No No No No No  Type of Paramedic of [place];Living will - - - - - -  Copy of [employer] in Chart? No - copy requested - - - - - -  Would patient like information on creating a medical advance directive? - - - - - - No - Patient declined  Pre-existing out of facility DNR order (yellow form or pink MOST form) - - - - - - -    Current Medications (verified) Outpatient Encounter Medications as of 06/22/2018  Medication Sig  . amLODipine (NORVASC) 10 MG tablet TAKE 1 TABLET BY MOUTH DAILY AT HOUR OF SLEEP  . carvedilol (COREG) 6.25 MG tablet TAKE 1 TABLET (6.25 MG TOTAL) BY MOUTH 2 (TWO) TIMES DAILY WITH A MEAL.  [name] Cholecalciferol (VITAMIN D3) 1000 UNIT/SPRAY LIQD Take by mouth.  . docusate sodium (COLACE) 100 MG capsule Take 100 mg by mouth daily.  . ferrous sulfate 325 (65 FE) MG tablet Take 1 tablet (325 mg total) by  mouth 2 (two) times daily with a meal.  . furosemide (LASIX) 20 MG tablet Take 1 tablet (20 mg total) by mouth daily as needed (Only when recommended by CHF clinic.).  [name] gabapentin (NEURONTIN) 100 MG capsule TAKE 1 CAPSULE BY MOUTH EVERYDAY AT BEDTIME  . hydrALAZINE (APRESOLINE) 100 MG tablet Take 1 tablet (100 mg total) by mouth 3 (three) times daily.  [name] KLOR-CON M20 20 MEQ tablet TAKE 1 TABLET BY MOUTH TWICE A DAY  . pantoprazole (PROTONIX) 40 MG tablet TAKE 1 TABLET BY MOUTH EVERY DAY  . traMADol (ULTRAM) 50 MG tablet Take 2 tablets (100 mg total) by mouth every 12 (twelve) hours as needed for moderate pain.  [name] warfarin (COUMADIN) 2.5 MG tablet Take 5 mg by mouth daily at 6 PM.   . losartan (COZAAR) 100 MG tablet Take 1 tablet (100 mg total) by mouth daily.   No facility-administered encounter medications on file as of 06/22/2018.     Allergies (verified) Patient has no known allergies.   History: Past Medical History:  Diagnosis Date  . AICD (automatic cardioverter/defibrillator) present   . Anemia   . Atrial fibrillation or flutter   . Cardiac arrest - ventricular fibrillation 12/10   with successful resucitation, S/p ICD  . CHF (congestive heart failure) ([place])   . Diverticula of colon 2011  . HTN (hypertension)    moderate  .  ICD (implantable cardiac defibrillator) in place    she has received appropriate therapy for VF  . Internal and external hemorrhoids without complication 2952  . Nonischemic cardiomyopathy ([place])    follow[facility] by Dr April [facility] at [facility]  . Osteopenia   . Plasma cell disorder 03/20/2012  . Plasma cell disorder 03/20/2012  . Sessile colonic polyp 2011   Dr Benson [place]   Past Surgical History:  Procedure Laterality Date  . ABDOMINAL HYSTERECTOMY    . CARDIAC CATHETERIZATION    . CARDIAC DEFIBRILLATOR PLACEMENT     by Greggory Brandy for secondary prevention of sudden death  . CHOLECYSTECTOMY N/A 10/10/15   Procedure: LAPAROSCOPIC CHOLECYSTECTOMY;  Surgeon: Rolm Bookbinder, MD;  Location: [place];  Service: General;  Laterality: N/A;  . COLONOSCOPY W/ POLYPECTOMY  2011   Dr Benson [place]  . INSERTION OF IMPLANTABLE LEFT VENTRICULAR ASSIST DEVICE N/A 04/06/2013   Procedure: INSERTION OF IMPLANTABLE LEFT VENTRICULAR ASSIST DEVICE;  Surgeon: Gaye Pollack, MD;  Location: [facility];  Service: Open Heart Surgery;  Laterality: N/A;  . INTRAOPERATIVE TRANSESOPHAGEAL ECHOCARDIOGRAM N/A 04/06/2013   Procedure: INTRAOPERATIVE TRANSESOPHAGEAL ECHOCARDIOGRAM;  Surgeon: Gaye Pollack, MD;  Location: [facility];  Service: Open Heart Surgery;  Laterality: N/A;  . IR GENERIC HISTORICAL  09/24/2016   IR US GUIDE VASC ACCESS RIGHT 09/24/2016 Greggory Keen, MD [facility]  . IR GENERIC HISTORICAL  09/24/2016   IR ANGIOGRAM VISCERAL SELECTIVE 09/24/2016 Greggory Keen, MD [facility]  . IR GENERIC HISTORICAL  09/24/2016   IR ANGIOGRAM VISCERAL SELECTIVE 09/24/2016 Greggory Keen, MD [facility]  . PLACEMENT OF CENTRIMAG VENTRICULAR ASSIST DEVICE Right 04/06/2013   Procedure: PLACEMENT OF CENTRIMAG VENTRICULAR ASSIST DEVICE;  Surgeon: Gaye Pollack, MD;  Location: [facility];  Service: Open Heart Surgery;  Laterality: Right;   Family History  Problem Relation Age of Onset  . Stroke Mother   . Stroke Brother    Social History   Socioeconomic History  . Marital status: Single    Spouse name: Not on file  . Number of children: 3  . Years of education: Not on file  . Highest education level: Not on file  Occupational History  . Not on file  Social Needs  . Financial resource strain: Not hard at all  . Food insecurity:    Worry: Never true    Inability: Never true  . Transportation needs:    Medical: No    Non-medical: No  Tobacco Use  . Smoking status: Never Smoker  . Smokeless tobacco: Never Used  . Tobacco comment: remote history of tobacco abuse  Substance and Sexual Activity  . Alcohol use: Yes    Comment: occsionally wine  . Drug use: No  . Sexual activity: Not Currently     Birth control/protection: Surgical  Lifestyle  . Physical activity:    Days per week: 3 days    Minutes per session: 40 min  . Stress: Not at all  Relationships  . Social connections:    Talks on phone: More than three times a week    Gets together: More than three times a week    Attends religious service: More than 4 times per year    Active member of club or organization: Yes    Attends meetings of clubs or organizations: More than 4 times per year    Relationship status: Living with partner  Other Topics Concern  . Not on file  Social History Narrative   Lives with spouse who was recently  diagnosed with esophageal ca and is receiving chemotherapy    Tobacco Counseling Counseling given: Not Answered Comment: remote history of tobacco abuse  Activities of Daily Living In your present state of health, do you have any difficulty performing the following activities: 06/22/2018  Hearing? N  Vision? N  Difficulty concentrating or making decisions? N  Walking or climbing stairs? N  Dressing or bathing? N  Doing errands, shopping? N  Preparing Food and eating ? N  Using the Toilet? N  In the past six months, have you accidently leaked urine? N  Do you have problems with loss of bowel control? N  Managing your Medications? N  Managing your Finances? N  Housekeeping or managing your Housekeeping? N  Some recent data might be hidden     Immunizations and Health Maintenance Immunization History  Administered Date(s) Administered  . Influenza Split 08/02/2012, 06/28/2016  . Influenza Whole 07/02/2007, 09/02/2010, 07/07/2013  . Influenza, High Dose Seasonal PF 06/09/2017, 05/12/2018  . Influenza,inj,Quad PF,6+ Mos 08/18/2015  . Pneumococcal Polysaccharide-23 07/15/2006, 03/13/2013, 02/04/2018  . Pneumococcal-Unspecified 02/04/2018  . Td 09/03/2001  . Tdap 02/03/2012  . Zoster Recombinat (Shingrix) 09/20/2017, 02/04/2018   There are no preventive care reminders to display  for this patient.  Patient Care Team: Janith Lima, MD as PCP - General (Internal Medicine) [name], Shaune Pascal, MD as Consulting Physician (Cardiology) Thompson Grayer, MD as Consulting Physician (Cardiology)  Indicate any recent Medical Services you may have received from other than [facility] providers in the past year (date may be approximate).     Assessment:   This is a routine wellness examination for [place]. Physical assessment deferred to PCP.   Hearing/Vision screen Hearing Screening Comments: Able to hear conversational tones w/o difficulty. No issues reported.  Passed whisper test  Vision Screening Comments: appointment yearly [facility]  Dietary issues and exercise activities discussed: Current Exercise Habits: Home exercise routine, Type of exercise: walking, Time (Minutes): 40, Frequency (Times/Week): 3, Weekly Exercise (Minutes/Week): 120, Intensity: Mild, Exercise limited by: None identified  Diet (meal preparation, eat out, water intake, caffeinated beverages, dairy products, fruits and vegetables): in general, a "healthy" diet  , well balanced  eats a variety of fruits and vegetables daily, limits salt, fat/cholesterol, sugar,carbohydrates,caffeine, drinks 6-8 glasses of water daily.  Reviewed heart healthy diet.   Goals    . Patient Stated     Continue to eat healthy, exercise, enjoy life and family.       Depression Screen PHQ 2/9 Scores 06/22/2018 03/04/2018 11/19/2016 10/29/2016 07/05/2013 02/03/2013  PHQ - 2 Score 0 0 0 0 0 0  Exception Documentation - - Other- indicate reason in comment box - - -    Fall Risk Fall Risk  06/22/2018 04/02/2018 03/04/2018 11/19/2016 10/29/2016  Falls in the past year? Yes No No No No  Comment - Emmi Telephone Survey: data to providers prior to load - - -  Risk for fall due to : - - - Other (Comment) -   Cognitive Function: MMSE - Mini Mental State Exam 06/22/2018  Orientation to time 5  Orientation to Place 5  Registration  3  Attention/ Calculation 3  Recall 0  Language- name 2 objects 2  Language- repeat 1  Language- follow 3 step command 3  Language- read & follow direction 1  Write a sentence 1  Copy design 1  Total score 25        Screening Tests Health Maintenance  Topic Date Due  .  PNA vac Low Risk Adult (2 of 2 - PCV13) 02/05/2019  . TETANUS/TDAP  02/02/2022  . INFLUENZA VACCINE  Completed  . DEXA SCAN  Completed     Plan:    Continue doing brain stimulating activities (puzzles, reading, adult coloring books, staying active) to keep memory sharp.   Continue to eat heart healthy diet (full of fruits, vegetables, whole grains, lean protein, water--limit salt, fat, and sugar intake) and increase physical activity as tolerated.   I have personally reviewed and noted the following in the patient's chart:   . Medical and social history . Use of alcohol, tobacco or illicit drugs  . Current medications and supplements . Functional ability and status . Nutritional status . Physical activity . Advanced directives . List of other physicians . Vitals . Screenings to include cognitive, depression, and falls . Referrals and appointments  In addition, I have reviewed and discussed with patient certain preventive protocols, quality metrics, and best practice recommendations. A written personalized care plan for preventive services as well as general preventive health recommendations were provided to patient.   Medical screening examination/treatment/procedure(s) were performed by non-physician practitioner and as supervising physician I was immediately available for consultation/collaboration. I agree with above. Scarlette Calico, MD   Michiel Cowboy, RN   06/22/2018

## 2018-06-22 ENCOUNTER — Ambulatory Visit (INDEPENDENT_AMBULATORY_CARE_PROVIDER_SITE_OTHER): Payer: Medicare Other | Admitting: Internal Medicine

## 2018-06-22 ENCOUNTER — Ambulatory Visit (INDEPENDENT_AMBULATORY_CARE_PROVIDER_SITE_OTHER): Payer: Medicare Other | Admitting: *Deleted

## 2018-06-22 ENCOUNTER — Encounter: Payer: Self-pay | Admitting: Internal Medicine

## 2018-06-22 VITALS — BP 116/82 | HR 72 | Temp 97.8°F | Resp 18 | Ht 60.0 in | Wt 181.0 lb

## 2018-06-22 VITALS — BP 116/82 | HR 72 | Temp 97.8°F | Ht 60.0 in | Wt 181.0 lb

## 2018-06-22 DIAGNOSIS — I480 Paroxysmal atrial fibrillation: Secondary | ICD-10-CM | POA: Diagnosis not present

## 2018-06-22 DIAGNOSIS — I10 Essential (primary) hypertension: Secondary | ICD-10-CM

## 2018-06-22 DIAGNOSIS — Z0000 Encounter for general adult medical examination without abnormal findings: Secondary | ICD-10-CM

## 2018-06-22 DIAGNOSIS — D508 Other iron deficiency anemias: Secondary | ICD-10-CM

## 2018-06-22 NOTE — Patient Instructions (Addendum)
Continue doing brain stimulating activities (puzzles, reading, adult coloring books, staying active) to keep memory sharp.   Continue to eat heart healthy diet (full of fruits, vegetables, whole grains, lean protein, water--limit salt, fat, and sugar intake) and increase physical activity as tolerated.   Ms. Brianna Hanson , Thank you for taking time to come for your Medicare Wellness Visit. I appreciate your ongoing commitment to your health goals. Please review the following plan we discussed and let me know if I can assist you in the future.   These are the goals we discussed: Goals    . Patient Stated     Continue to eat healthy, exercise, enjoy life and family.        This is a list of the screening recommended for you and due dates:  Health Maintenance  Topic Date Due  . Pneumonia vaccines (2 of 2 - PCV13) 02/05/2019  . Tetanus Vaccine  02/02/2022  . Flu Shot  Completed  . DEXA scan (bone density measurement)  Completed   Health Maintenance, Female Adopting a healthy lifestyle and getting preventive care can go a long way to promote health and wellness. Talk with your health care provider about what schedule of regular examinations is right for you. This is a good chance for you to check in with your provider about disease prevention and staying healthy. In between checkups, there are plenty of things you can do on your own. Experts have done a lot of research about which lifestyle changes and preventive measures are most likely to keep you healthy. Ask your health care provider for more information. Weight and diet Eat a healthy diet  Be sure to include plenty of vegetables, fruits, low-fat dairy products, and lean protein.  Do not eat a lot of foods high in solid fats, added sugars, or salt.  Get regular exercise. This is one of the most important things you can do for your health. ? Most adults should exercise for at least 150 minutes each week. The exercise should increase your  heart rate and make you sweat (moderate-intensity exercise). ? Most adults should also do strengthening exercises at least twice a week. This is in addition to the moderate-intensity exercise.  Maintain a healthy weight  Body mass index (BMI) is a measurement that can be used to identify possible weight problems. It estimates body fat based on height and weight. Your health care provider can help determine your BMI and help you achieve or maintain a healthy weight.  For females 25 years of age and older: ? A BMI below 18.5 is considered underweight. ? A BMI of 18.5 to 24.9 is normal. ? A BMI of 25 to 29.9 is considered overweight. ? A BMI of 30 and above is considered obese.  Watch levels of cholesterol and blood lipids  You should start having your blood tested for lipids and cholesterol at 81 years of age, then have this test every 5 years.  You may need to have your cholesterol levels checked more often if: ? Your lipid or cholesterol levels are high. ? You are older than 81 years of age. ? You are at high risk for heart disease.  Cancer screening Lung Cancer  Lung cancer screening is recommended for adults 19-71 years old who are at high risk for lung cancer because of a history of smoking.  A yearly low-dose CT scan of the lungs is recommended for people who: ? Currently smoke. ? Have quit within the past 15 years. ?  Have at least a 30-pack-year history of smoking. A pack year is smoking an average of one pack of cigarettes a day for 1 year.  Yearly screening should continue until it has been 15 years since you quit.  Yearly screening should stop if you develop a health problem that would prevent you from having lung cancer treatment.  Breast Cancer  Practice breast self-awareness. This means understanding how your breasts normally appear and feel.  It also means doing regular breast self-exams. Let your health care provider know about any changes, no matter how  small.  If you are in your 20s or 30s, you should have a clinical breast exam (CBE) by a health care provider every 1-3 years as part of a regular health exam.  If you are 60 or older, have a CBE every year. Also consider having a breast X-ray (mammogram) every year.  If you have a family history of breast cancer, talk to your health care provider about genetic screening.  If you are at high risk for breast cancer, talk to your health care provider about having an MRI and a mammogram every year.  Breast cancer gene (BRCA) assessment is recommended for women who have family members with BRCA-related cancers. BRCA-related cancers include: ? Breast. ? Ovarian. ? Tubal. ? Peritoneal cancers.  Results of the assessment will determine the need for genetic counseling and BRCA1 and BRCA2 testing.  Cervical Cancer Your health care provider may recommend that you be screened regularly for cancer of the pelvic organs (ovaries, uterus, and vagina). This screening involves a pelvic examination, including checking for microscopic changes to the surface of your cervix (Pap test). You may be encouraged to have this screening done every 3 years, beginning at age 32.  For women ages 52-65, health care providers may recommend pelvic exams and Pap testing every 3 years, or they may recommend the Pap and pelvic exam, combined with testing for human papilloma virus (HPV), every 5 years. Some types of HPV increase your risk of cervical cancer. Testing for HPV may also be done on women of any age with unclear Pap test results.  Other health care providers may not recommend any screening for nonpregnant women who are considered low risk for pelvic cancer and who do not have symptoms. Ask your health care provider if a screening pelvic exam is right for you.  If you have had past treatment for cervical cancer or a condition that could lead to cancer, you need Pap tests and screening for cancer for at least 20 years  after your treatment. If Pap tests have been discontinued, your risk factors (such as having a new sexual partner) need to be reassessed to determine if screening should resume. Some women have medical problems that increase the chance of getting cervical cancer. In these cases, your health care provider may recommend more frequent screening and Pap tests.  Colorectal Cancer  This type of cancer can be detected and often prevented.  Routine colorectal cancer screening usually begins at 81 years of age and continues through 81 years of age.  Your health care provider may recommend screening at an earlier age if you have risk factors for colon cancer.  Your health care provider may also recommend using home test kits to check for hidden blood in the stool.  A small camera at the end of a tube can be used to examine your colon directly (sigmoidoscopy or colonoscopy). This is done to check for the earliest forms of colorectal  cancer.  Routine screening usually begins at age 13.  Direct examination of the colon should be repeated every 5-10 years through 81 years of age. However, you may need to be screened more often if early forms of precancerous polyps or small growths are found.  Skin Cancer  Check your skin from head to toe regularly.  Tell your health care provider about any new moles or changes in moles, especially if there is a change in a mole's shape or color.  Also tell your health care provider if you have a mole that is larger than the size of a pencil eraser.  Always use sunscreen. Apply sunscreen liberally and repeatedly throughout the day.  Protect yourself by wearing long sleeves, pants, a wide-brimmed hat, and sunglasses whenever you are outside.  Heart disease, diabetes, and high blood pressure  High blood pressure causes heart disease and increases the risk of stroke. High blood pressure is more likely to develop in: ? People who have blood pressure in the high end of  the normal range (130-139/85-89 mm Hg). ? People who are overweight or obese. ? People who are African American.  If you are 49-24 years of age, have your blood pressure checked every 3-5 years. If you are 62 years of age or older, have your blood pressure checked every year. You should have your blood pressure measured twice-once when you are at a hospital or clinic, and once when you are not at a hospital or clinic. Record the average of the two measurements. To check your blood pressure when you are not at a hospital or clinic, you can use: ? An automated blood pressure machine at a pharmacy. ? A home blood pressure monitor.  If you are between 91 years and 32 years old, ask your health care provider if you should take aspirin to prevent strokes.  Have regular diabetes screenings. This involves taking a blood sample to check your fasting blood sugar level. ? If you are at a normal weight and have a low risk for diabetes, have this test once every three years after 81 years of age. ? If you are overweight and have a high risk for diabetes, consider being tested at a younger age or more often. Preventing infection Hepatitis B  If you have a higher risk for hepatitis B, you should be screened for this virus. You are considered at high risk for hepatitis B if: ? You were born in a country where hepatitis B is common. Ask your health care provider which countries are considered high risk. ? Your parents were born in a high-risk country, and you have not been immunized against hepatitis B (hepatitis B vaccine). ? You have HIV or AIDS. ? You use needles to inject street drugs. ? You live with someone who has hepatitis B. ? You have had sex with someone who has hepatitis B. ? You get hemodialysis treatment. ? You take certain medicines for conditions, including cancer, organ transplantation, and autoimmune conditions.  Hepatitis C  Blood testing is recommended for: ? Everyone born from [birth year]  through 1963. ? Anyone with known risk factors for hepatitis C.  Sexually transmitted infections (STIs)  You should be screened for sexually transmitted infections (STIs) including gonorrhea and chlamydia if: ? You are sexually active and are younger than 81 years of age. ? You are older than 81 years of age and your health care provider tells you that you are at risk for this type of infection. ? Your  sexual activity has changed since you were last screened and you are at an increased risk for chlamydia or gonorrhea. Ask your health care provider if you are at risk.  If you do not have HIV, but are at risk, it may be recommended that you take a prescription medicine daily to prevent HIV infection. This is called pre-exposure prophylaxis (PrEP). You are considered at risk if: ? You are sexually active and do not regularly use condoms or know the HIV status of your partner(s). ? You take drugs by injection. ? You are sexually active with a partner who has HIV.  Talk with your health care provider about whether you are at high risk of being infected with HIV. If you choose to begin PrEP, you should first be tested for HIV. You should then be tested every 3 months for as long as you are taking PrEP. Pregnancy  If you are premenopausal and you may become pregnant, ask your health care provider about preconception counseling.  If you may become pregnant, take 400 to 800 micrograms (mcg) of folic acid every day.  If you want to prevent pregnancy, talk to your health care provider about birth control (contraception). Osteoporosis and menopause  Osteoporosis is a disease in which the bones lose minerals and strength with aging. This can result in serious bone fractures. Your risk for osteoporosis can be identified using a bone density scan.  If you are 65 years of age or older, or if you are at risk for osteoporosis and fractures, ask your health care provider if you should be screened.  Ask  your health care provider whether you should take a calcium or vitamin D supplement to lower your risk for osteoporosis.  Menopause may have certain physical symptoms and risks.  Hormone replacement therapy may reduce some of these symptoms and risks. Talk to your health care provider about whether hormone replacement therapy is right for you. Follow these instructions at home:  Schedule regular health, dental, and eye exams.  Stay current with your immunizations.  Do not use any tobacco products including cigarettes, chewing tobacco, or electronic cigarettes.  If you are pregnant, do not drink alcohol.  If you are breastfeeding, limit how much and how often you drink alcohol.  Limit alcohol intake to no more than 1 drink per day for nonpregnant women. One drink equals 12 ounces of beer, 5 ounces of wine, or 1 ounces of hard liquor.  Do not use street drugs.  Do not share needles.  Ask your health care provider for help if you need support or information about quitting drugs.  Tell your health care provider if you often feel depressed.  Tell your health care provider if you have ever been abused or do not feel safe at home. This information is not intended to replace advice given to you by your health care provider. Make sure you discuss any questions you have with your health care provider. Document Released: 03/04/2011 Document Revised: 01/25/2016 Document Reviewed: 05/23/2015 Elsevier Interactive Patient Education  [name].

## 2018-06-22 NOTE — Patient Instructions (Signed)

## 2018-06-22 NOTE — Progress Notes (Signed)
Subjective:  Patient ID: Brianna Hanson, female    DOB: 09-10-1934  Age: 83 y.o. MRN: [id number]  CC: Hypertension; Anemia; and Congestive Heart Failure   HPI JOHAN ANTONACCI presents for f/up - She feels well today. Offers no complaints.  Outpatient Medications Prior to Visit  Medication Sig Dispense Refill  . amLODipine (NORVASC) 10 MG tablet TAKE 1 TABLET BY MOUTH DAILY AT HOUR OF SLEEP 90 tablet 3  . carvedilol (COREG) 6.25 MG tablet TAKE 1 TABLET (6.25 MG TOTAL) BY MOUTH 2 (TWO) TIMES DAILY WITH A MEAL. 60 tablet 11  . Cholecalciferol (VITAMIN D3) 1000 UNIT/SPRAY LIQD Take by mouth.    . docusate sodium (COLACE) 100 MG capsule Take 100 mg by mouth daily.    . ferrous sulfate 325 (65 FE) MG tablet Take 1 tablet (325 mg total) by mouth 2 (two) times daily with a meal. 60 tablet 6  . furosemide (LASIX) 20 MG tablet Take 1 tablet (20 mg total) by mouth daily as needed (Only when recommended by CHF clinic.). 30 tablet 6  . gabapentin (NEURONTIN) 100 MG capsule TAKE 1 CAPSULE BY MOUTH EVERYDAY AT BEDTIME 90 capsule 3  . hydrALAZINE (APRESOLINE) 100 MG tablet Take 1 tablet (100 mg total) by mouth 3 (three) times daily. 360 tablet 6  . KLOR-CON M20 20 MEQ tablet TAKE 1 TABLET BY MOUTH TWICE A DAY 60 tablet 10  . pantoprazole (PROTONIX) 40 MG tablet TAKE 1 TABLET BY MOUTH EVERY DAY 90 tablet 2  . traMADol (ULTRAM) 50 MG tablet Take 2 tablets (100 mg total) by mouth every 12 (twelve) hours as needed for moderate pain. 60 tablet 3  . warfarin (COUMADIN) 2.5 MG tablet Take 5 mg by mouth daily at 6 PM.     . losartan (COZAAR) 100 MG tablet Take 1 tablet (100 mg total) by mouth daily. 180 tablet 3   No facility-administered medications prior to visit.     ROS Review of Systems  Constitutional: Negative for diaphoresis, fatigue and unexpected weight change.  HENT: Negative.   Eyes: Negative for visual disturbance.  Respiratory: Negative for cough, chest tightness, shortness of breath and  wheezing.   Cardiovascular: Negative for chest pain, palpitations and leg swelling.  Gastrointestinal: Negative for abdominal pain, diarrhea, nausea and vomiting.  Genitourinary: Negative.  Negative for difficulty urinating.  Musculoskeletal: Negative.  Negative for arthralgias and myalgias.  Skin: Negative.   Neurological: Negative.  Negative for dizziness, weakness and light-headedness.  Hematological: Negative for adenopathy. Does not bruise/bleed easily.  Psychiatric/Behavioral: Negative.     Objective:  BP 116/82 (BP Location: Left Arm, Patient Position: Sitting, Cuff Size: Normal)   Pulse 72   Temp 97.8 F (36.6 C) (Oral)   Ht 5' (1.524 m)   Wt 181 lb (82.1 kg)   SpO2 98%   BMI 35.35 kg/m   BP Readings from Last 3 Encounters:  06/22/18 116/82  06/22/18 116/82  05/19/18 116/80    Wt Readings from Last 3 Encounters:  06/22/18 181 lb (82.1 kg)  06/22/18 181 lb (82.1 kg)  05/19/18 181 lb (82.1 kg)    Physical Exam  Constitutional: She is oriented to person, place, and time. No distress.  HENT:  Mouth/Throat: Oropharynx is clear and moist. No oropharyngeal exudate.  Eyes: Conjunctivae are normal. No scleral icterus.  Neck: Normal range of motion. Neck supple. No JVD present. No thyromegaly present.  Cardiovascular: Normal rate and regular rhythm. Exam reveals no gallop and no friction rub.  No murmur heard. +++ LVAD hum  Pulmonary/Chest: Effort normal and breath sounds normal. No respiratory distress. She has no wheezes. She has no rales.  Abdominal: Soft. Bowel sounds are normal. She exhibits no mass. There is no tenderness.  Musculoskeletal: Normal range of motion. She exhibits no edema, tenderness or deformity.  Lymphadenopathy:    She has no cervical adenopathy.  Neurological: She is alert and oriented to person, place, and time.  Skin: Skin is warm and dry. She is not diaphoretic. No pallor.  Psychiatric: She has a normal Hanson and affect. Her behavior is  normal. Judgment and thought content normal.  Vitals reviewed.   Lab Results  Component Value Date   WBC 5.7 05/19/2018   HGB 12.1 05/19/2018   HCT 39.7 05/19/2018   PLT 221 05/19/2018   GLUCOSE 98 05/19/2018   CHOL 160 05/19/2018   TRIG 189 (H) 05/19/2018   HDL 44 05/19/2018   LDLCALC 78 05/19/2018   ALT 14 05/19/2018   AST 23 05/19/2018   NA 138 05/19/2018   K 4.3 05/19/2018   CL 110 05/19/2018   CREATININE 0.89 05/19/2018   BUN 8 05/19/2018   CO2 22 05/19/2018   TSH 2.88 03/04/2018   INR 2.3 06/16/2018   HGBA1C 5.2 03/04/2018    No results found.  Assessment & Plan:   Marvalene was seen today for hypertension, anemia and congestive heart failure.  Diagnoses and all orders for this visit:  Paroxysmal atrial fibrillation ([place])- She has good rate and rhythm control.  Will continue anticoagulation with warfarin.  Essential hypertension, benign- Her blood pressure is well controlled.  Recent electrolytes and renal function were normal.  Other iron deficiency anemia- Her H&H were recently normal.  She will continue taking the iron supplement.   I am having Brianna Hanson maintain her ferrous sulfate, furosemide, docusate sodium, hydrALAZINE, traMADol, losartan, KLOR-CON M20, carvedilol, gabapentin, warfarin, amLODipine, Vitamin D3, and pantoprazole.  No orders of the defined types were placed in this encounter.    Follow-up: No follow-ups on file.  Scarlette Calico, MD

## 2018-06-23 ENCOUNTER — Ambulatory Visit (HOSPITAL_COMMUNITY): Payer: Self-pay | Admitting: Pharmacist

## 2018-06-23 LAB — POCT INR: INR: 2.9 (ref 2.0–3.0)

## 2018-06-30 ENCOUNTER — Ambulatory Visit (HOSPITAL_COMMUNITY): Payer: Self-pay | Admitting: Pharmacist

## 2018-06-30 LAB — POCT INR: INR: 2.3 (ref 2.0–3.0)

## 2018-07-07 ENCOUNTER — Ambulatory Visit (HOSPITAL_COMMUNITY): Payer: Self-pay | Admitting: Pharmacist

## 2018-07-07 LAB — POCT INR: INR: 2.2 (ref 2.0–3.0)

## 2018-07-14 ENCOUNTER — Ambulatory Visit (HOSPITAL_COMMUNITY): Payer: Self-pay | Admitting: Pharmacist

## 2018-07-14 DIAGNOSIS — Z7901 Long term (current) use of anticoagulants: Secondary | ICD-10-CM | POA: Diagnosis not present

## 2018-07-14 LAB — POCT INR: INR: 2.3 (ref 2.0–3.0)

## 2018-07-17 ENCOUNTER — Other Ambulatory Visit (HOSPITAL_COMMUNITY): Payer: Self-pay | Admitting: Internal Medicine

## 2018-07-20 ENCOUNTER — Other Ambulatory Visit (HOSPITAL_COMMUNITY): Payer: Self-pay | Admitting: *Deleted

## 2018-07-20 DIAGNOSIS — Z95811 Presence of heart assist device: Secondary | ICD-10-CM

## 2018-07-20 DIAGNOSIS — Z7901 Long term (current) use of anticoagulants: Secondary | ICD-10-CM

## 2018-07-21 ENCOUNTER — Encounter (HOSPITAL_COMMUNITY): Payer: Self-pay

## 2018-07-21 ENCOUNTER — Ambulatory Visit (HOSPITAL_COMMUNITY)
Admission: RE | Admit: 2018-07-21 | Discharge: 2018-07-21 | Disposition: A | Discharge status: Home / Self Care | Payer: Medicare Other | Source: Ambulatory Visit | Attending: Cardiology | Admitting: Cardiology

## 2018-07-21 ENCOUNTER — Ambulatory Visit (HOSPITAL_COMMUNITY): Payer: Self-pay | Admitting: Pharmacist

## 2018-07-21 DIAGNOSIS — I5022 Chronic systolic (congestive) heart failure: Secondary | ICD-10-CM | POA: Diagnosis not present

## 2018-07-21 DIAGNOSIS — K625 Hemorrhage of anus and rectum: Secondary | ICD-10-CM | POA: Insufficient documentation

## 2018-07-21 DIAGNOSIS — I110 Hypertensive heart disease with heart failure: Secondary | ICD-10-CM | POA: Diagnosis not present

## 2018-07-21 DIAGNOSIS — Z95810 Presence of automatic (implantable) cardiac defibrillator: Secondary | ICD-10-CM | POA: Insufficient documentation

## 2018-07-21 DIAGNOSIS — I480 Paroxysmal atrial fibrillation: Secondary | ICD-10-CM | POA: Insufficient documentation

## 2018-07-21 DIAGNOSIS — Z79899 Other long term (current) drug therapy: Secondary | ICD-10-CM | POA: Insufficient documentation

## 2018-07-21 DIAGNOSIS — K5790 Diverticulosis of intestine, part unspecified, without perforation or abscess without bleeding: Secondary | ICD-10-CM | POA: Diagnosis not present

## 2018-07-21 DIAGNOSIS — I428 Other cardiomyopathies: Secondary | ICD-10-CM | POA: Diagnosis not present

## 2018-07-21 DIAGNOSIS — Z95811 Presence of heart assist device: Secondary | ICD-10-CM | POA: Insufficient documentation

## 2018-07-21 DIAGNOSIS — M8580 Other specified disorders of bone density and structure, unspecified site: Secondary | ICD-10-CM | POA: Insufficient documentation

## 2018-07-21 DIAGNOSIS — Z7901 Long term (current) use of anticoagulants: Secondary | ICD-10-CM | POA: Diagnosis not present

## 2018-07-21 DIAGNOSIS — Z8674 Personal history of sudden cardiac arrest: Secondary | ICD-10-CM | POA: Diagnosis not present

## 2018-07-21 LAB — CBC
HCT: 37.9 % (ref 36.0–46.0)
Hemoglobin: 11.4 g/dL — ABNORMAL LOW (ref 12.0–15.0)
MCH: 28.6 pg (ref 26.0–34.0)
MCHC: 30.1 g/dL (ref 30.0–36.0)
MCV: 95 fL (ref 80.0–100.0)
Platelets: 231 10*3/uL (ref 150–400)
RBC: 3.99 MIL/uL (ref 3.87–5.11)
RDW: 16 % — ABNORMAL HIGH (ref 11.5–15.5)
WBC: 6.1 10*3/uL (ref 4.0–10.5)
nRBC: 0 % (ref 0.0–0.2)

## 2018-07-21 LAB — LACTATE DEHYDROGENASE: LDH: 171 U/L (ref 98–192)

## 2018-07-21 LAB — PROTIME-INR
INR: 2.17
Prothrombin Time: 23.9 seconds — ABNORMAL HIGH (ref 11.4–15.2)

## 2018-07-21 LAB — BASIC METABOLIC PANEL
Anion gap: 8 (ref 5–15)
BUN: 10 mg/dL (ref 8–23)
CO2: 19 mmol/L — ABNORMAL LOW (ref 22–32)
Calcium: 8.8 mg/dL — ABNORMAL LOW (ref 8.9–10.3)
Chloride: 111 mmol/L (ref 98–111)
Creatinine, Ser: 0.93 mg/dL (ref 0.44–1.00)
GFR calc Af Amer: >60 mL/min (ref >60)
GFR calc non Af Amer: 56 mL/min — ABNORMAL LOW (ref >60)
Glucose, Bld: 95 mg/dL (ref 70–99)
Potassium: 3.8 mmol/L (ref 3.5–5.1)
Sodium: 138 mmol/L (ref 135–145)

## 2018-07-21 MED ORDER — PANTOPRAZOLE SODIUM 40 MG PO TBEC: 40.0000 mg | DELAYED_RELEASE_TABLET | Freq: Every day | ORAL | 3 refills | Status: DC

## 2018-07-21 MED ORDER — TRAMADOL HCL 50 MG PO TABS: 100.0000 mg | ORAL_TABLET | Freq: Two times a day (BID) | ORAL | 3 refills | Status: DC | PRN

## 2018-07-21 MED ORDER — LOSARTAN POTASSIUM 100 MG PO TABS: 100.0000 mg | ORAL_TABLET | Freq: Every day | ORAL | 3 refills | Status: DC

## 2018-07-21 NOTE — Progress Notes (Addendum)
Patient presents for 2 month  follow up in [facility] today with caregiver, [name].  Reports no problems with VAD equipment or concerns with drive line.  Pt had 3 brief low flow alarms on 07/20/18, 3 low flow alarms on 11/16, and 1 low flow on 11/15. She says she was not symptomatic. Drank extra fluid after each alarm. States that with no further alarms noted. She has not needed prn Lasix.   Reports that she had bloody stool 2 weeks ago for several occurrences. She said she had eaten popcorn and nuts, and that this irritated her diverticulitis. No blood seen since.    Vital Signs:  Doppler Pressure:  82 Automatc BP:  104/58 (69) HR:  76 SPO2:  97 %  Weight: 181.6 lb w/o eqt Last weight: 181.0 lbs  VAD Indication: Destination Therapy - age excluding and patient choice    VAD interrogation & Equipment Management: Speed: 8600 Flow: 3.4 Power: 4.1 w    PI: 6.7  Alarms: 3 low flows 11/18, 3 low flows 11/16, 1 low flow 11/15 (asymptomatic) Events: 10-30 PI daily; except days with low flows 30-70 PI events  Fixed speed 8600 Low speed limit: 8000  Primary Controller: Replace back up battery in 28 months. Back up controller: Replace back up battery in 23 months.  Exit Site Care: Drive line is being maintained weekly by [employer]. Drive line exit site well healed and incorporated. The velour is fully implanted at exit site. Dressing dry and intact. No erythema or drainage. Stabilization device present and accurately applied. Pt denies fever or chills. Gave pt 9 dressing kits for home use.   Significant Events on VAD Support:  09/21/16 GIB, Ramp echo speed decreased to 8400  01/08/17 - LOW FLOW alarms; speed increased to 8800 RPM 05/20/17> speed decreased to 8600  Device:[employer] Jude ICD Therapies: on at 200 bpm; monitor on 110 DDD 60 Last check: 11/16/17  BP & Labs: Doppler BP - 82  is reflecting Modified systolic  Hgb 16.0 - No S/S of bleeding other than the above noted bloody  stool occurance. Specifically denies melena/BRBPR or nosebleeds during the last two months, other than the above noted bloody stool occurrence.   LDH 171- Within the range of  150 - 300. Denies any dark urine.   Patient Instructions: 1. No change in meds. 2. Return to [place] [facility] in 2 months.  Emerson Monte RN [place] Coordinator  Office: [phone]  24/7 Pager: [phone]

## 2018-07-21 NOTE — Progress Notes (Signed)
LVAD Clinic Note  Patient ID: Brianna Hanson, female   DOB: February 13, 1935, 83 y.o.   MRN: [id number] Primary Heart Failure: Dr Haroldine Laws Cardiac Surgeon: Dr Prescott Gum  HPI: Brianna Hanson is a 83 y.o. 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 [name] (follwoed q 6 months). Underwent implantation of the HeartMate II LVAD on 04/06/13 for DT. She is not on aspirin due to dizziness. S/P lap cholecystitis 10/2015.   GI Issues 09/2016 LGIB. Angio- no source of bleeding. LVAD speed turned down to 8400  LVAD Speed Change 12/2016 Ramp ECHO -->Speed increased from 8400>8800  She presents today for regular follow up. Feeling great overall. Denies lightheadedness or dizziness. No CP, SOB, or   tachypalpitations. She did have an episode of BRBPR 2 weeks ago. It was after eating popcorn, which has aggravated her diverticulosis in the past. None since. She had several low flow events on her VAD. She states she was asymptomatic these days. She hasn't walked in 3 weeks. States she went on a trip, and now has just been too cold to go out.   VAD Indication: Destination Therapy - Age excluding and patient choice.   VAD interrogation & Equipment Management: Speed: 8600 Flow: 3.4 Power: 4.1 w PI: 6.7  Alarms: 3 LF 11/18, 4 LF 11/16, 1 LF 11/15 with multiple PI events Events: 10-30 daily.   Fixed speed 8600 Low speed limit: 8000  Primary back up battery replacement due in 28 months.  Secondary back up battery replacement due in 23 months.  Past Medical History:  Diagnosis Date  . AICD (automatic cardioverter/defibrillator) present   . Anemia   . Atrial fibrillation or flutter   . Cardiac arrest - ventricular fibrillation 12/10   with successful resucitation, S/p ICD  . CHF (congestive heart failure) ([place])   . Diverticula of colon 2011  . HTN (hypertension)    moderate  . ICD (implantable cardiac defibrillator)  in place    she has received appropriate therapy for VF  . Internal and external hemorrhoids without complication 9357  . Nonischemic cardiomyopathy ([place])    [facility] Dr April [facility]  . Osteopenia   . Plasma cell disorder 03/20/2012  . Plasma cell disorder 03/20/2012  . Sessile colonic polyp 2011   Dr Benson [place]    Current Outpatient Medications  Medication Sig Dispense Refill  . amLODipine (NORVASC) 10 MG tablet TAKE 1 TABLET BY MOUTH DAILY AT HOUR OF SLEEP 90 tablet 3  . carvedilol (COREG) 6.25 MG tablet TAKE 1 TABLET (6.25 MG TOTAL) BY MOUTH 2 (TWO) TIMES DAILY WITH A MEAL. 60 tablet 11  . Cholecalciferol (VITAMIN D3) 1000 UNIT/SPRAY LIQD Take by mouth.    . docusate sodium (COLACE) 100 MG capsule Take 100 mg by mouth daily.    . ferrous sulfate 325 (65 FE) MG tablet Take 1 tablet (325 mg total) by mouth 2 (two) times daily with a meal. 60 tablet 6  . furosemide (LASIX) 20 MG tablet Take 1 tablet (20 mg total) by mouth daily as needed (Only when recommended by CHF clinic.). 30 tablet 6  . gabapentin (NEURONTIN) 100 MG capsule TAKE 1 CAPSULE BY MOUTH EVERYDAY AT BEDTIME 90 capsule 3  . hydrALAZINE (APRESOLINE) 100 MG tablet Take 1 tablet (100 mg total) by mouth 3 (three) times daily. 360 tablet 6  . KLOR-CON M20 20 MEQ tablet TAKE  1 TABLET BY MOUTH TWICE A DAY 60 tablet 10  . losartan (COZAAR) 100 MG tablet Take 1 tablet (100 mg total) by mouth daily. 180 tablet 3  . pantoprazole (PROTONIX) 40 MG tablet TAKE 1 TABLET BY MOUTH EVERY DAY 90 tablet 2  . traMADol (ULTRAM) 50 MG tablet Take 2 tablets (100 mg total) by mouth every 12 (twelve) hours as needed for moderate pain. 60 tablet 3  . warfarin (COUMADIN) 2.5 MG tablet Take 5 mg by mouth daily at 6 PM.      No current facility-administered medications for this visit.    No Known Allergies  Review of systems complete and found to be negative unless listed in HPI.    Vitals:   07/21/18 0958 07/21/18 1011  BP: (!) 82/0 (!) 104/58    Pulse: 76   SpO2: 97%   Weight: 82.4 kg (181 lb 9.6 oz)    Vital Signs:  Doppler Pressure: 82 Automatc BP: 104/58 (69) HR: 76 SPO2: 97%  Weight: 181 lbs lb w/o eqt Last weight: 181 lbs   Physical Exam: General: Well appearing this am. NAD.  HEENT: Normal. Neck: Supple, JVP 7-8 cm. Carotids OK.  Cardiac:  Mechanical heart sounds with LVAD hum present.  Lungs:  CTAB, normal effort.  Abdomen:  NT, ND, no HSM. No bruits or masses. +BS  LVAD exit site: Well-healed and incorporated. Dressing dry and intact. No erythema or drainage. Stabilization device present and accurately applied. Driveline dressing changed daily per sterile technique. Extremities:  Warm and dry. No cyanosis, clubbing, rash, or edema.  Neuro:  Alert & oriented x 3. Cranial nerves grossly intact. Moves all 4 extremities w/o difficulty. Affect pleasant     ASSESSMENT AND PLAN:  1) Chronic systolic HF: NICM, s/p ICD and LVAD for DT(04/2013).  - 5 years out, she is doing very well with VAD support.  - NYHA II symptoms - Volume status stable on exam.  - Stable off lasix. Unable to tolerate Entresto with low flow alarms - Has occasional low flows but improves with fluid and salt. Can add florinef or midodrine as needed  2) LVAD placed for DT 04/2013:  - Failed Entresto due to diuresis and incessant low flow alarms.  - Continue losartan 100 mg daily. MAP stable.  - No ASA with GI bleed and intolerance.  - Continue coumadin. No bleeding.  - LDH 171 - INR 2.17. Discussed dosing with PharmD personally. - VAD interrogated personally. Parameters stable.   3) PAF:  - Remains in NSR.  - Off amiodarone.  4) HTN:  - Meds and MAPs as above.  5) Chronic Anticoagulation:  - As above.   - Goal INR 1.8-2.3.  - INR 2.17. Discussed dosing with PharmD personally.  6) BRBPR with known diverticulosis - In setting of dietary indiscretion. CBC stable today. If further bleeding, will need GI follow up. Hgb 11.4 today.   Stable  as above. Encouraged increased hydration and physical activity.   Shirley Friar, PA-C 8:43 AM   Greater than 50% of the 40 minute visit was spent in counseling/coordination of care regarding disease state education, salt/fluid restriction, sliding scale diuretics, and medication compliance.

## 2018-07-29 ENCOUNTER — Ambulatory Visit (HOSPITAL_COMMUNITY): Payer: Self-pay | Admitting: Pharmacist

## 2018-07-29 LAB — POCT INR: INR: 2.9 (ref 2.0–3.0)

## 2018-08-04 ENCOUNTER — Ambulatory Visit (HOSPITAL_COMMUNITY): Payer: Self-pay | Admitting: Pharmacist

## 2018-08-04 ENCOUNTER — Ambulatory Visit (INDEPENDENT_AMBULATORY_CARE_PROVIDER_SITE_OTHER): Payer: Medicare Other

## 2018-08-04 DIAGNOSIS — I4901 Ventricular fibrillation: Secondary | ICD-10-CM

## 2018-08-04 DIAGNOSIS — Z23 Encounter for immunization: Secondary | ICD-10-CM | POA: Diagnosis not present

## 2018-08-04 LAB — POCT INR: INR: 2.1 (ref 2.0–3.0)

## 2018-08-04 NOTE — Progress Notes (Signed)
Remote ICD transmission.   

## 2018-08-07 NOTE — Progress Notes (Signed)
letter

## 2018-08-11 ENCOUNTER — Ambulatory Visit (HOSPITAL_COMMUNITY): Payer: Self-pay | Admitting: Pharmacist

## 2018-08-11 DIAGNOSIS — Z7901 Long term (current) use of anticoagulants: Secondary | ICD-10-CM | POA: Diagnosis not present

## 2018-08-11 LAB — POCT INR: INR: 2.9 (ref 2.0–3.0)

## 2018-08-12 ENCOUNTER — Other Ambulatory Visit: Payer: Self-pay | Admitting: Internal Medicine

## 2018-08-16 ENCOUNTER — Other Ambulatory Visit (HOSPITAL_COMMUNITY): Payer: Self-pay | Admitting: Cardiology

## 2018-08-18 ENCOUNTER — Ambulatory Visit (HOSPITAL_COMMUNITY): Payer: Self-pay | Admitting: Pharmacist

## 2018-08-18 LAB — POCT INR: INR: 2.3 (ref 2.0–3.0)

## 2018-08-25 LAB — POCT INR: INR: 2.4 (ref 2.0–3.0)

## 2018-08-27 ENCOUNTER — Ambulatory Visit (HOSPITAL_COMMUNITY): Payer: Self-pay | Admitting: Pharmacist

## 2018-08-28 ENCOUNTER — Other Ambulatory Visit: Payer: Self-pay | Admitting: Internal Medicine

## 2018-09-01 ENCOUNTER — Ambulatory Visit (HOSPITAL_COMMUNITY): Payer: Self-pay | Admitting: Pharmacist

## 2018-09-01 LAB — POCT INR: INR: 2.5 (ref 2.0–3.0)

## 2018-09-08 ENCOUNTER — Ambulatory Visit (HOSPITAL_COMMUNITY): Payer: Self-pay | Admitting: Pharmacist

## 2018-09-08 DIAGNOSIS — Z7901 Long term (current) use of anticoagulants: Secondary | ICD-10-CM | POA: Diagnosis not present

## 2018-09-08 LAB — POCT INR: INR: 2.1 (ref 2–3)

## 2018-09-08 NOTE — Patient Instructions (Signed)
Left voicemail to continue current regimen. Will continue with Home INR checks every Tuesday. Instructed to call VAD office for questions

## 2018-09-09 ENCOUNTER — Other Ambulatory Visit (HOSPITAL_COMMUNITY): Payer: Self-pay | Admitting: Internal Medicine

## 2018-09-15 ENCOUNTER — Ambulatory Visit (HOSPITAL_COMMUNITY): Payer: Self-pay | Admitting: Pharmacist

## 2018-09-15 LAB — POCT INR: INR: 1.9 — AB (ref 2.0–3.0)

## 2018-09-18 ENCOUNTER — Other Ambulatory Visit (HOSPITAL_COMMUNITY): Payer: Self-pay | Admitting: Unknown Physician Specialty

## 2018-09-18 DIAGNOSIS — Z7901 Long term (current) use of anticoagulants: Secondary | ICD-10-CM

## 2018-09-18 DIAGNOSIS — Z95811 Presence of heart assist device: Secondary | ICD-10-CM

## 2018-09-18 DIAGNOSIS — I4901 Ventricular fibrillation: Secondary | ICD-10-CM

## 2018-09-21 ENCOUNTER — Ambulatory Visit (HOSPITAL_COMMUNITY)
Admission: RE | Admit: 2018-09-21 | Discharge: 2018-09-21 | Disposition: A | Discharge status: Home / Self Care | Payer: Medicare Other | Source: Ambulatory Visit | Attending: Internal Medicine | Admitting: Internal Medicine

## 2018-09-21 ENCOUNTER — Ambulatory Visit (HOSPITAL_COMMUNITY): Payer: Self-pay | Admitting: Pharmacist

## 2018-09-21 ENCOUNTER — Encounter (HOSPITAL_COMMUNITY): Payer: Self-pay

## 2018-09-21 VITALS — BP 105/80 | HR 80 | Wt 180.2 lb

## 2018-09-21 DIAGNOSIS — I428 Other cardiomyopathies: Secondary | ICD-10-CM | POA: Insufficient documentation

## 2018-09-21 DIAGNOSIS — Z8674 Personal history of sudden cardiac arrest: Secondary | ICD-10-CM | POA: Insufficient documentation

## 2018-09-21 DIAGNOSIS — Z79899 Other long term (current) drug therapy: Secondary | ICD-10-CM | POA: Insufficient documentation

## 2018-09-21 DIAGNOSIS — I5022 Chronic systolic (congestive) heart failure: Secondary | ICD-10-CM | POA: Diagnosis not present

## 2018-09-21 DIAGNOSIS — Z7901 Long term (current) use of anticoagulants: Secondary | ICD-10-CM | POA: Diagnosis not present

## 2018-09-21 DIAGNOSIS — I480 Paroxysmal atrial fibrillation: Secondary | ICD-10-CM

## 2018-09-21 DIAGNOSIS — Z95811 Presence of heart assist device: Secondary | ICD-10-CM | POA: Diagnosis not present

## 2018-09-21 DIAGNOSIS — D649 Anemia, unspecified: Secondary | ICD-10-CM | POA: Insufficient documentation

## 2018-09-21 DIAGNOSIS — I10 Essential (primary) hypertension: Secondary | ICD-10-CM

## 2018-09-21 DIAGNOSIS — I110 Hypertensive heart disease with heart failure: Secondary | ICD-10-CM | POA: Diagnosis not present

## 2018-09-21 DIAGNOSIS — I4891 Unspecified atrial fibrillation: Secondary | ICD-10-CM | POA: Diagnosis not present

## 2018-09-21 DIAGNOSIS — Z95810 Presence of automatic (implantable) cardiac defibrillator: Secondary | ICD-10-CM | POA: Diagnosis not present

## 2018-09-21 LAB — COMPREHENSIVE METABOLIC PANEL
ALT: 16 U/L (ref 0–44)
AST: 22 U/L (ref 15–41)
Albumin: 3.3 g/dL — ABNORMAL LOW (ref 3.5–5.0)
Alkaline Phosphatase: 84 U/L (ref 38–126)
Anion gap: 9 (ref 5–15)
BUN: 11 mg/dL (ref 8–23)
CO2: 21 mmol/L — ABNORMAL LOW (ref 22–32)
Calcium: 9.1 mg/dL (ref 8.9–10.3)
Chloride: 109 mmol/L (ref 98–111)
Creatinine, Ser: 0.99 mg/dL (ref 0.44–1.00)
GFR calc Af Amer: >60 mL/min (ref >60)
GFR calc non Af Amer: 53 mL/min — ABNORMAL LOW (ref >60)
Glucose, Bld: 101 mg/dL — ABNORMAL HIGH (ref 70–99)
Potassium: 4.1 mmol/L (ref 3.5–5.1)
Sodium: 139 mmol/L (ref 135–145)
Total Bilirubin: 0.7 mg/dL (ref 0.3–1.2)
Total Protein: 8.5 g/dL — ABNORMAL HIGH (ref 6.5–8.1)

## 2018-09-21 LAB — PREALBUMIN: Prealbumin: 15.7 mg/dL — ABNORMAL LOW (ref 18–38)

## 2018-09-21 LAB — LACTATE DEHYDROGENASE: LDH: 237 U/L — ABNORMAL HIGH (ref 98–192)

## 2018-09-21 LAB — CBC
HCT: 39.1 % (ref 36.0–46.0)
Hemoglobin: 11.9 g/dL — ABNORMAL LOW (ref 12.0–15.0)
MCH: 29.2 pg (ref 26.0–34.0)
MCHC: 30.4 g/dL (ref 30.0–36.0)
MCV: 95.8 fL (ref 80.0–100.0)
Platelets: 212 10*3/uL (ref 150–400)
RBC: 4.08 MIL/uL (ref 3.87–5.11)
RDW: 15.8 % — ABNORMAL HIGH (ref 11.5–15.5)
WBC: 5.5 10*3/uL (ref 4.0–10.5)
nRBC: 0 % (ref 0.0–0.2)

## 2018-09-21 LAB — PROTIME-INR
INR: 1.9
Prothrombin Time: 21.5 seconds — ABNORMAL HIGH (ref 11.4–15.2)

## 2018-09-21 NOTE — Progress Notes (Signed)
LVAD Clinic Note  Patient ID: Brianna Hanson, female   DOB: [birth year], 84 y.o.   MRN: [id number] Primary Heart Failure: Dr Haroldine Laws Cardiac Surgeon: Dr Prescott Gum  HPI: Brianna Hanson is a 84 y.o. 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 [name] (follwoed q 6 months). Underwent implantation of the HeartMate II LVAD on 04/06/13 for DT. She is not on aspirin due to dizziness. S/P lap cholecystitis 10/2015.   GI Issues 09/2016 LGIB. Angio- no source of bleeding. LVAD speed turned down to 8400  LVAD Speed Change 12/2016 Ramp ECHO -->Speed increased from 8400>8800  She presents today for regular follow up. Feeling very well. Still walking at [facility]. Denies orthopnea or PND. No fevers, chills or problems with driveline. No bleeding, melena or neuro symptoms. Occasioanl low flows on VADTaking all meds as prescribed.     VAD Indication: Destination Therapy - age excluding and patient choice    VAD interrogation & Equipment Management: Speed: 8600 Flow: 4.7 Power: 4.7 w PI: 5.9  Alarms: 3 low flows 09/16/2018 asymptomatic Events: 15-35 PI daily; few true suction events  Fixed speed 8600 Low speed limit: 8000  Primary Controller: Replace back up battery in 24 months. Back up controller: Replace back up battery in19 months.   Past Medical History:  Diagnosis Date  . AICD (automatic cardioverter/defibrillator) present   . Anemia   . Atrial fibrillation or flutter   . Cardiac arrest - ventricular fibrillation 12/10   with successful resucitation, S/p ICD  . CHF (congestive heart failure) ([place])   . Diverticula of colon 2011  . HTN (hypertension)    moderate  . ICD (implantable cardiac defibrillator) in place    she has received appropriate therapy for VF  . Internal and external hemorrhoids without complication 0762  . Nonischemic cardiomyopathy ([place])    followed by Dr April Holding at  [facility]  . Osteopenia   . Plasma cell disorder 03/20/2012  . Plasma cell disorder 03/20/2012  . Sessile colonic polyp 2011   Dr Benson [place]    Current Outpatient Medications  Medication Sig Dispense Refill  . amLODipine (NORVASC) 10 MG tablet TAKE 1 TABLET BY MOUTH DAILY AT HOUR OF SLEEP 90 tablet 3  . carvedilol (COREG) 6.25 MG tablet TAKE 1 TABLET BY MOUTH 2 TIMES DAILY WITH A MEAL 180 tablet 3  . Cholecalciferol (VITAMIN D3) 1000 UNIT/SPRAY LIQD Take by mouth.    . docusate sodium (COLACE) 100 MG capsule Take 100 mg by mouth daily.    . ferrous sulfate 325 (65 FE) MG tablet Take 1 tablet (325 mg total) by mouth 2 (two) times daily with a meal. 60 tablet 6  . gabapentin (NEURONTIN) 100 MG capsule TAKE 1 CAPSULE BY MOUTH EVERYDAY AT BEDTIME 90 capsule 3  . hydrALAZINE (APRESOLINE) 100 MG tablet TAKE 1 TABLET BY MOUTH 3 TIMES A DAY 270 tablet 9  . KLOR-CON M20 20 MEQ tablet TAKE 1 TABLET BY MOUTH TWICE A DAY 180 tablet 3  . losartan (COZAAR) 100 MG tablet Take 1 tablet (100 mg total) by mouth daily. 180 tablet 3  . pantoprazole (PROTONIX) 40 MG tablet Take 1 tablet (40 mg total) by mouth daily. 90 tablet 3  . traMADol (ULTRAM) 50 MG tablet Take 2 tablets (100 mg total) by mouth every 12 (twelve) hours as needed for moderate pain. 60 tablet 3  . warfarin (  COUMADIN) 2.5 MG tablet Take 5 mg by mouth daily at 6 PM. Except 1 tablet (2.5 mg) on Tuesday    . furosemide (LASIX) 20 MG tablet Take 1 tablet (20 mg total) by mouth daily as needed (Only when recommended by CHF clinic.). (Patient not taking: Reported on 07/21/2018) 30 tablet 6   No current facility-administered medications for this encounter.    No Known Allergies  Review of systems complete and found to be negative unless listed in HPI.    Vitals:   09/21/18 1008 09/21/18 1009  BP: (!) 102/0 105/80  Pulse: 80   SpO2: 97%   Weight: 81.7 kg (180 lb 3.2 oz)    Vital Signs:  Doppler Pressure:  102 Automatc BP:  105/80 (94) HR:   80 SPO2:  97 %  Weight: 180.2lb w/o eqt Last weight: 181.6 lbs  Physical Exam: General:  NAD.  HEENT: normal  Neck: supple. JVP not elevated.  Carotids 2+ bilat; no bruits. No lymphadenopathy or thryomegaly appreciated. Cor: LVAD hum.  Lungs: Clear. Abdomen: soft, nontender, non-distended. No hepatosplenomegaly. No bruits or masses. Good bowel sounds. Driveline site clean. Anchor in place.  Extremities: no cyanosis, clubbing, rash. Warm no edema  Neuro: alert & oriented x 3. No focal deficits. Moves all 4 without problem    ASSESSMENT AND PLAN:  1) Chronic systolic HF: NICM, s/p ICD and LVAD for DT(04/2013).  - 5+ years out, she is doing very well with VAD support.  - Stable NYHA I-II symptoms - Volume status stable on exam.  - Stable off lasix. Unable to tolerate Entresto with low flow alarms - Has occasional low flows but improves consistently with fluid and salt. Can add florinef or midodrine as needed would avoid if possible with HTN  2) LVAD placed for DT 04/2013:  - Failed Entresto due to diuresis and incessant low flow alarms.  - Continue losartan 100 mg daily. MAP slightly elevated but stable .  - No ASA with GI bleed and intolerance.  - Continue coumadin. No bleeding.  - LDH 237 - INR 1.90. Discussed dosing with PharmD personally. - VAD interrogated personally. Parameters stable. 3) PAF:  - Remains in NSR.  - Off amiodarone.  4) HTN:  - MAP slightly elevated but stable. Will continue current regimen.  5) Chronic Anticoagulation:  - As above.   - Goal INR 1.8-2.3.  - INR 1.90 Personally reviewed   Glori Bickers, MD 11:11 AM

## 2018-09-21 NOTE — Progress Notes (Signed)
Patient presents for 2 month  follow up in [facility] today with caregiver, [name]. Reports no problems with VAD equipment or concerns with drive line.  Pt had 4 brief low flow alarms on 09/16/2018. She says she was not symptomatic. Drank extra fluid after each alarm. States that with no further alarms noted. She has not needed prn Lasix.    Vital Signs:  Doppler Pressure:  102 Automatc BP:  105/80 (94) HR:  80 SPO2:  97 %  Weight: 180.2lb w/o eqt Last weight: 181.6 lbs  VAD Indication: Destination Therapy - age excluding and patient choice    VAD interrogation & Equipment Management: Speed: 8600 Flow: 4.7 Power: 4.7 w    PI: 5.9  Alarms: 3 low flows 09/16/2018 asymptomatic Events: 15-35 PI daily; few true suction events  Fixed speed 8600 Low speed limit: 8000  Primary Controller: Replace back up battery in 24 months. Back up controller: Replace back up battery in 19 months.  Exit Site Care: Drive line is being maintained weekly by [employer]. Drive line exit site well healed and incorporated. The velour is fully implanted at exit site. Dressing dry and intact. No erythema or drainage. Stabilization device present and accurately applied. Pt denies fever or chills. Gave pt 8 dressing kits for home use.   Significant Events on VAD Support:  09/21/16 GIB, Ramp echo speed decreased to 8400  01/08/17 - LOW FLOW alarms; speed increased to 8800 RPM 05/20/17> speed decreased to 8600  Device:St Jude ICD Therapies: on at 200 bpm; monitor on 110 DDD 60 Last check: 11/16/17  BP & Labs: Doppler BP- 102  is reflecting Modified systolic  Hgb 27.5 - No S/S of bleeding other than the above noted bloody stool occurance. Specifically denies melena/BRBPR or nosebleeds during the last two months, other than the above noted bloody stool occurrence.   LDH 237 - Within the range of  150 - 300. Denies any dark urine.  5.5 year Intermacs follow up completed including: Quality of Life, KCCQ-12,  and Neurocognitive trail making.   Pt completed 800 feet during 6 minute walk. Had to stop once due to shortness of breath.    Back up controller: 11V backup battery charged during this visit.   Patient Instructions: 1. No change in meds. 2. Return to [facility] in 2 months.  Emerson Monte RN [place] Coordinator  Office: [phone]  24/7 Pager: [phone]

## 2018-09-24 LAB — CUP PACEART REMOTE DEVICE CHECK
Battery Remaining Longevity: 19 mo
Battery Remaining Percentage: 18 %
Battery Voltage: 2.75 V
Brady Statistic AP VP Percent: 1 %
Brady Statistic AP VS Percent: 11 %
Brady Statistic AS VP Percent: 1 %
Brady Statistic AS VS Percent: 78 %
Brady Statistic RA Percent Paced: 2.4 %
Brady Statistic RV Percent Paced: 1 %
Date Time Interrogation Session: 20191203100001
HighPow Impedance: 49 Ohm
Implantable Lead Implant Date: 20110104
Implantable Lead Implant Date: 20110104
Implantable Lead Location: 753859
Implantable Lead Location: 753860
Implantable Lead Model: 7121
Implantable Pulse Generator Implant Date: 20110104
Lead Channel Impedance Value: 310 Ohm
Lead Channel Impedance Value: 310 Ohm
Lead Channel Pacing Threshold Amplitude: 0.75 V
Lead Channel Pacing Threshold Amplitude: 0.75 V
Lead Channel Pacing Threshold Pulse Width: 0.5 ms
Lead Channel Pacing Threshold Pulse Width: 0.5 ms
Lead Channel Sensing Intrinsic Amplitude: 0.8 mV
Lead Channel Sensing Intrinsic Amplitude: 8.6 mV
Lead Channel Setting Pacing Amplitude: 2 V
Lead Channel Setting Pacing Amplitude: 2.5 V
Lead Channel Setting Pacing Pulse Width: 0.5 ms
Lead Channel Setting Sensing Sensitivity: 0.5 mV
Pulse Gen Serial Number: 754815

## 2018-09-29 ENCOUNTER — Ambulatory Visit (HOSPITAL_COMMUNITY): Payer: Self-pay | Admitting: Pharmacist

## 2018-09-29 LAB — POCT INR: INR: 2.2 (ref 2.0–3.0)

## 2018-10-06 ENCOUNTER — Ambulatory Visit (HOSPITAL_COMMUNITY): Payer: Self-pay | Admitting: Pharmacist

## 2018-10-06 DIAGNOSIS — Z7901 Long term (current) use of anticoagulants: Secondary | ICD-10-CM | POA: Diagnosis not present

## 2018-10-06 LAB — POCT INR: INR: 2.2 (ref 2.0–3.0)

## 2018-10-13 ENCOUNTER — Ambulatory Visit (HOSPITAL_COMMUNITY): Payer: Self-pay | Admitting: Pharmacist

## 2018-10-13 LAB — POCT INR: INR: 2.3 (ref 2.0–3.0)

## 2018-10-20 ENCOUNTER — Ambulatory Visit (HOSPITAL_COMMUNITY): Payer: Self-pay | Admitting: Pharmacist

## 2018-10-20 LAB — POCT INR: INR: 2.2 (ref 2.0–3.0)

## 2018-10-21 ENCOUNTER — Other Ambulatory Visit: Payer: Self-pay | Admitting: Internal Medicine

## 2018-10-27 ENCOUNTER — Ambulatory Visit (HOSPITAL_COMMUNITY): Payer: Self-pay | Admitting: Pharmacist

## 2018-10-27 LAB — POCT INR: INR: 1.8 — AB (ref 2.0–3.0)

## 2018-11-03 ENCOUNTER — Ambulatory Visit (INDEPENDENT_AMBULATORY_CARE_PROVIDER_SITE_OTHER): Payer: Medicare Other | Admitting: *Deleted

## 2018-11-03 ENCOUNTER — Ambulatory Visit (HOSPITAL_COMMUNITY): Payer: Self-pay | Admitting: Pharmacist

## 2018-11-03 DIAGNOSIS — I5022 Chronic systolic (congestive) heart failure: Secondary | ICD-10-CM

## 2018-11-03 DIAGNOSIS — Z7901 Long term (current) use of anticoagulants: Secondary | ICD-10-CM | POA: Diagnosis not present

## 2018-11-03 DIAGNOSIS — I4901 Ventricular fibrillation: Secondary | ICD-10-CM

## 2018-11-03 LAB — CUP PACEART REMOTE DEVICE CHECK
Battery Remaining Longevity: 12 mo
Battery Remaining Percentage: 13 %
Battery Voltage: 2.71 V
Brady Statistic AP VP Percent: 1 %
Brady Statistic AP VS Percent: 13 %
Brady Statistic AS VP Percent: 1 %
Brady Statistic AS VS Percent: 75 %
Brady Statistic RA Percent Paced: 2.9 %
Brady Statistic RV Percent Paced: 1 %
Date Time Interrogation Session: 20200303103241
HighPow Impedance: 43 Ohm
Implantable Lead Implant Date: 20110104
Implantable Lead Implant Date: 20110104
Implantable Lead Location: 753859
Implantable Lead Location: 753860
Implantable Lead Model: 7121
Implantable Pulse Generator Implant Date: 20110104
Lead Channel Impedance Value: 300 Ohm
Lead Channel Impedance Value: 310 Ohm
Lead Channel Pacing Threshold Amplitude: 0.75 V
Lead Channel Pacing Threshold Amplitude: 0.75 V
Lead Channel Pacing Threshold Pulse Width: 0.5 ms
Lead Channel Pacing Threshold Pulse Width: 0.5 ms
Lead Channel Sensing Intrinsic Amplitude: 0.8 mV
Lead Channel Sensing Intrinsic Amplitude: 8.9 mV
Lead Channel Setting Pacing Amplitude: 2 V
Lead Channel Setting Pacing Amplitude: 2.5 V
Lead Channel Setting Pacing Pulse Width: 0.5 ms
Lead Channel Setting Sensing Sensitivity: 0.5 mV
Pulse Gen Serial Number: 754815

## 2018-11-03 LAB — POCT INR: INR: 2.3 (ref 2.0–3.0)

## 2018-11-10 ENCOUNTER — Ambulatory Visit (HOSPITAL_COMMUNITY): Payer: Self-pay | Admitting: Pharmacist

## 2018-11-10 LAB — POCT INR: INR: 2.5 (ref 2–3)

## 2018-11-10 NOTE — Progress Notes (Signed)
Remote ICD transmission.   

## 2018-11-17 ENCOUNTER — Telehealth (HOSPITAL_COMMUNITY): Payer: Self-pay | Admitting: Licensed Clinical Social Worker

## 2018-11-17 ENCOUNTER — Ambulatory Visit (HOSPITAL_COMMUNITY): Payer: Self-pay | Admitting: Pharmacist

## 2018-11-17 LAB — POCT INR: INR: 2.1 (ref 2–3)

## 2018-11-17 NOTE — Telephone Encounter (Signed)
CSW contacted patient to assure food, medications and confirmation of VAD pager number if concerns or emergency arise. Patient instructed to stay home and use of proper hygiene and call if needed.  Patient informed that VAD team will call the day before any upcoming appointments with instructions due to current CoVid 19 outbreak. Patient verbalizes understanding and denies any current concerns. Jackie Drae Mitzel, LCSW, CCSW-MCS [phone]  

## 2018-11-23 ENCOUNTER — Telehealth (HOSPITAL_COMMUNITY): Payer: Self-pay | Admitting: Cardiology

## 2018-11-23 ENCOUNTER — Encounter (HOSPITAL_COMMUNITY): Payer: Medicare Other

## 2018-11-23 NOTE — Telephone Encounter (Signed)
Received [employer] paperwork, would like to know how to get papers to VAD team

## 2018-11-24 ENCOUNTER — Ambulatory Visit (HOSPITAL_COMMUNITY): Payer: Self-pay | Admitting: Pharmacist

## 2018-11-24 LAB — POCT INR: INR: 2.4 (ref 2–3)

## 2018-12-01 ENCOUNTER — Ambulatory Visit (HOSPITAL_COMMUNITY): Payer: Self-pay | Admitting: Pharmacist

## 2018-12-01 DIAGNOSIS — Z7901 Long term (current) use of anticoagulants: Secondary | ICD-10-CM | POA: Diagnosis not present

## 2018-12-01 LAB — POCT INR: INR: 2.3 (ref 2–3)

## 2018-12-01 NOTE — Telephone Encounter (Signed)
VAD TEAM AWARE AND WILL ASSIST WITH FORMS

## 2018-12-02 ENCOUNTER — Telehealth (HOSPITAL_COMMUNITY): Payer: Self-pay | Admitting: Licensed Clinical Social Worker

## 2018-12-02 NOTE — Telephone Encounter (Signed)
CSW contacted patient/caregiver to follow up on status with coronavirus and if anything needed. Patient instructed to stay home and use of proper hygiene and call if needed. Patient denies any concerns at this time and verbalizes understanding of process for contacting VAD Coordinators if needed. CSW continues to follow as needed. Jackie Amjad Fikes, LCSW, CCSW-MCS [phone] 

## 2018-12-09 ENCOUNTER — Ambulatory Visit (HOSPITAL_COMMUNITY): Payer: Self-pay | Admitting: Pharmacist

## 2018-12-09 LAB — POCT INR: INR: 2.3 (ref 2.0–3.0)

## 2018-12-11 ENCOUNTER — Telehealth (HOSPITAL_COMMUNITY): Payer: Self-pay | Admitting: *Deleted

## 2018-12-11 NOTE — Telephone Encounter (Signed)
Spoke with Brianna Hanson regarding upcoming appointment 12/16/18. States she has been doing great. Denies fever, shortness of breath, dizziness, falls, bleeding, or dark urine. Reports drinking an adequate amount.   VAD #s:  Speed: 8600 Flow: 3.4 Power: 4.1w PI: 6.3  Denies alarms or issues with equipment. Drive line looks great. Has adequate dressing supplies at this time.   Will rescheduled follow up appointment to 01/11/19 at 11:00. Instructed patient to call if she needs to be seen sooner. She verbalized understanding.  Emerson Monte RN [place] Coordinator  Office: [phone]  24/7 Pager: [phone]

## 2018-12-15 ENCOUNTER — Ambulatory Visit (HOSPITAL_COMMUNITY): Payer: Self-pay | Admitting: Pharmacist

## 2018-12-15 LAB — POCT INR: INR: 2.1 (ref 2–3)

## 2018-12-16 ENCOUNTER — Other Ambulatory Visit (HOSPITAL_COMMUNITY): Payer: Self-pay | Admitting: Internal Medicine

## 2018-12-16 ENCOUNTER — Encounter (HOSPITAL_COMMUNITY): Payer: Medicare Other

## 2018-12-22 ENCOUNTER — Ambulatory Visit (HOSPITAL_COMMUNITY): Payer: Self-pay | Admitting: Pharmacist

## 2018-12-22 LAB — POCT INR: INR: 2.1 (ref 2–3)

## 2018-12-29 DIAGNOSIS — Z7901 Long term (current) use of anticoagulants: Secondary | ICD-10-CM | POA: Diagnosis not present

## 2018-12-29 LAB — POCT INR: INR: 2.4 (ref 2.0–3.0)

## 2018-12-30 ENCOUNTER — Ambulatory Visit (HOSPITAL_COMMUNITY): Payer: Self-pay | Admitting: Pharmacist

## 2019-01-01 ENCOUNTER — Telehealth (HOSPITAL_COMMUNITY): Payer: Self-pay | Admitting: Licensed Clinical Social Worker

## 2019-01-01 NOTE — Telephone Encounter (Signed)
Message left for return call to review needs for Covid 19 public health concerns and emergency VAD pager number. Jackie Asa Baudoin, LCSW, CCSW-MCS [phone]  

## 2019-01-05 ENCOUNTER — Ambulatory Visit (HOSPITAL_COMMUNITY): Payer: Self-pay | Admitting: Pharmacist

## 2019-01-05 LAB — POCT INR: INR: 2.4 (ref 2–3)

## 2019-01-07 ENCOUNTER — Other Ambulatory Visit (HOSPITAL_COMMUNITY): Payer: Self-pay | Admitting: *Deleted

## 2019-01-07 DIAGNOSIS — Z95811 Presence of heart assist device: Secondary | ICD-10-CM

## 2019-01-07 DIAGNOSIS — Z7901 Long term (current) use of anticoagulants: Secondary | ICD-10-CM

## 2019-01-07 DIAGNOSIS — I5043 Acute on chronic combined systolic (congestive) and diastolic (congestive) heart failure: Secondary | ICD-10-CM

## 2019-01-11 ENCOUNTER — Other Ambulatory Visit: Payer: Self-pay

## 2019-01-11 ENCOUNTER — Ambulatory Visit (HOSPITAL_COMMUNITY)
Admission: RE | Admit: 2019-01-11 | Discharge: 2019-01-11 | Disposition: A | Discharge status: Home / Self Care | Payer: Medicare Other | Source: Ambulatory Visit | Attending: Cardiology | Admitting: Cardiology

## 2019-01-11 ENCOUNTER — Encounter (HOSPITAL_COMMUNITY): Payer: Medicare Other

## 2019-01-11 ENCOUNTER — Ambulatory Visit (HOSPITAL_COMMUNITY): Payer: Self-pay | Admitting: Pharmacist

## 2019-01-11 DIAGNOSIS — Z95811 Presence of heart assist device: Secondary | ICD-10-CM | POA: Diagnosis not present

## 2019-01-11 DIAGNOSIS — Z7901 Long term (current) use of anticoagulants: Secondary | ICD-10-CM | POA: Insufficient documentation

## 2019-01-11 DIAGNOSIS — I5043 Acute on chronic combined systolic (congestive) and diastolic (congestive) heart failure: Secondary | ICD-10-CM | POA: Insufficient documentation

## 2019-01-11 LAB — CBC
HCT: 39.9 % (ref 36.0–46.0)
Hemoglobin: 12.6 g/dL (ref 12.0–15.0)
MCH: 30.2 pg (ref 26.0–34.0)
MCHC: 31.6 g/dL (ref 30.0–36.0)
MCV: 95.7 fL (ref 80.0–100.0)
Platelets: 209 10*3/uL (ref 150–400)
RBC: 4.17 MIL/uL (ref 3.87–5.11)
RDW: 15.4 % (ref 11.5–15.5)
WBC: 4.7 10*3/uL (ref 4.0–10.5)
nRBC: 0 % (ref 0.0–0.2)

## 2019-01-11 LAB — BASIC METABOLIC PANEL
Anion gap: 10 (ref 5–15)
BUN: 11 mg/dL (ref 8–23)
CO2: 19 mmol/L — ABNORMAL LOW (ref 22–32)
Calcium: 9.3 mg/dL (ref 8.9–10.3)
Chloride: 109 mmol/L (ref 98–111)
Creatinine, Ser: 0.92 mg/dL (ref 0.44–1.00)
GFR calc Af Amer: >60 mL/min (ref >60)
GFR calc non Af Amer: 58 mL/min — ABNORMAL LOW (ref >60)
Glucose, Bld: 104 mg/dL — ABNORMAL HIGH (ref 70–99)
Potassium: 3.9 mmol/L (ref 3.5–5.1)
Sodium: 138 mmol/L (ref 135–145)

## 2019-01-11 LAB — PROTIME-INR
INR: 2.1 — ABNORMAL HIGH (ref 0.8–1.2)
Prothrombin Time: 22.9 seconds — ABNORMAL HIGH (ref 11.4–15.2)

## 2019-01-11 LAB — LACTATE DEHYDROGENASE: LDH: 214 U/L — ABNORMAL HIGH (ref 98–192)

## 2019-01-11 NOTE — Addendum Note (Signed)
Encounter addended by: Lezlie Octave, RN on: 01/11/2019 11:53 AM  Actions taken: Clinical Note Signed

## 2019-01-11 NOTE — Progress Notes (Signed)
Pt here for lab only visit.  Pt provided with 14 weekly dressing kits for home use.  Zada Girt RN, [place] Coordinator [phone]

## 2019-01-12 ENCOUNTER — Other Ambulatory Visit (HOSPITAL_COMMUNITY): Payer: Medicare Other

## 2019-01-13 ENCOUNTER — Encounter (HOSPITAL_COMMUNITY): Payer: Self-pay

## 2019-01-13 ENCOUNTER — Other Ambulatory Visit: Payer: Self-pay

## 2019-01-13 ENCOUNTER — Ambulatory Visit (HOSPITAL_COMMUNITY)
Admission: RE | Admit: 2019-01-13 | Discharge: 2019-01-13 | Disposition: A | Discharge status: Home / Self Care | Payer: Medicare Other | Source: Ambulatory Visit | Attending: Adult Health | Admitting: Adult Health

## 2019-01-13 VITALS — Wt 169.4 lb

## 2019-01-13 DIAGNOSIS — I480 Paroxysmal atrial fibrillation: Secondary | ICD-10-CM

## 2019-01-13 DIAGNOSIS — Z95811 Presence of heart assist device: Secondary | ICD-10-CM | POA: Diagnosis not present

## 2019-01-13 DIAGNOSIS — Z7901 Long term (current) use of anticoagulants: Secondary | ICD-10-CM

## 2019-01-13 DIAGNOSIS — I10 Essential (primary) hypertension: Secondary | ICD-10-CM

## 2019-01-13 DIAGNOSIS — I5022 Chronic systolic (congestive) heart failure: Secondary | ICD-10-CM | POA: Diagnosis not present

## 2019-01-13 NOTE — Progress Notes (Signed)
Heart Failure TeleHealth Note  Due to national recommendations of social distancing due to [place] 19, Audio/video telehealth visit is felt to be most appropriate for this patient at this time.  See MyChart message from today for patient consent regarding telehealth for [facility].  Date:  01/13/2019   ID:  [name], DOB Dec 22, 1934, MRN [id number]  Location: Home  Provider location: [place] [facility] Type of Visit: Established patient   PCP:  Janith Lima, MD  Cardiologist:  No primary care provider on file. Primary HF: Dr Haroldine Laws  Chief Complaint: HF/LVAD  History of Present Illness: Brianna Hanson is a 84 y.o. 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 [name] (follwoed q 6 months). Underwent implantation of the HeartMate II LVAD on 04/06/13 for DT. She is not on aspirin due to dizziness. S/P lap cholecystitis 10/2015.   GI Issues 09/2016 LGIB. Angio- no source of bleeding. LVAD speed turned down to 8400  LVAD Speed Change 12/2016 Ramp ECHO -->Speed increased from 8400>8800  She  presents via audio/video conferencing for a telehealth visit today. Overall feeling fine. Denies SOB/PND/Orthopnea. No issues with driveline.Says she an alarm on Sunday with flow down to 2.4  Says it went away she drank some water.  Appetite ok. No fever or chills. Weight at home 169 pounds. No BRBPR. Every now and then she has little blood in her bowel movement. None recently.  Taking all medications.    she denies symptoms worrisome for COVID 19.   LVAD Interrogation HM II Speed: 8600  Flow: 3.8 PI: 6.3 Power: 4.3    Past Medical History:  Diagnosis Date  . AICD (automatic cardioverter/defibrillator) present   . Anemia   . Atrial fibrillation or flutter   . Cardiac arrest - ventricular fibrillation 12/10   with successful resucitation, S/p ICD  . CHF (congestive heart failure)  ([place])   . Diverticula of colon 2011  . HTN (hypertension)    moderate  . ICD (implantable cardiac defibrillator) in place    she has received appropriate therapy for VF  . Internal and external hemorrhoids without complication 5465  . Nonischemic cardiomyopathy ([place])    [facility] Dr April Holding at [facility]  . Osteopenia   . Plasma cell disorder 03/20/2012  . Plasma cell disorder 03/20/2012  . Sessile colonic polyp 2011   Dr Benson Norway   Past Surgical History:  Procedure Laterality Date  . ABDOMINAL HYSTERECTOMY    . CARDIAC CATHETERIZATION    . CARDIAC DEFIBRILLATOR PLACEMENT     by Greggory Brandy for secondary prevention of sudden death  . CHOLECYSTECTOMY N/A 11-03-2015   Procedure: LAPAROSCOPIC CHOLECYSTECTOMY;  Surgeon: Rolm Bookbinder, MD;  Location: [place];  Service: General;  Laterality: N/A;  . COLONOSCOPY W/ POLYPECTOMY  2011   Dr Benson Norway  . INSERTION OF IMPLANTABLE LEFT VENTRICULAR ASSIST DEVICE N/A 04/06/2013   Procedure: INSERTION OF IMPLANTABLE LEFT VENTRICULAR ASSIST DEVICE;  Surgeon: Gaye Pollack, MD;  Location: [place];  Service: Open Heart Surgery;  Laterality: N/A;  . INTRAOPERATIVE TRANSESOPHAGEAL ECHOCARDIOGRAM N/A 04/06/2013   Procedure: INTRAOPERATIVE TRANSESOPHAGEAL ECHOCARDIOGRAM;  Surgeon: Gaye Pollack, MD;  Location: [place] OR;  Service: Open Heart Surgery;  Laterality: N/A;  . IR GENERIC HISTORICAL  09/24/2016   IR US GUIDE VASC ACCESS RIGHT 09/24/2016 Greggory Keen, MD [facility]  . IR GENERIC HISTORICAL  09/24/2016  IR ANGIOGRAM VISCERAL SELECTIVE 09/24/2016 Greggory Keen, MD [facility]  . IR GENERIC HISTORICAL  09/24/2016   IR ANGIOGRAM VISCERAL SELECTIVE 09/24/2016 Greggory Keen, MD [facility]  . PLACEMENT OF CENTRIMAG VENTRICULAR ASSIST DEVICE Right 04/06/2013   Procedure: PLACEMENT OF CENTRIMAG VENTRICULAR ASSIST DEVICE;  Surgeon: Gaye Pollack, MD;  Location: [place];  Service: Open Heart Surgery;  Laterality: Right;     Current Outpatient Medications  Medication Sig Dispense  Refill  . amLODipine (NORVASC) 10 MG tablet TAKE 1 TABLET BY MOUTH DAILY AT HOUR OF SLEEP 90 tablet 3  . carvedilol (COREG) 6.25 MG tablet TAKE 1 TABLET BY MOUTH 2 TIMES DAILY WITH A MEAL 180 tablet 3  . Cholecalciferol (VITAMIN D3) 1000 UNIT/SPRAY LIQD Take by mouth.    . docusate sodium (COLACE) 100 MG capsule Take 100 mg by mouth daily.    . ferrous sulfate 325 (65 FE) MG tablet Take 1 tablet (325 mg total) by mouth 2 (two) times daily with a meal. 60 tablet 6  . furosemide (LASIX) 20 MG tablet Take 1 tablet (20 mg total) by mouth daily as needed (Only when recommended by CHF clinic.). 30 tablet 6  . gabapentin (NEURONTIN) 100 MG capsule TAKE 1 CAPSULE BY MOUTH EVERYDAY AT BEDTIME 90 capsule 3  . hydrALAZINE (APRESOLINE) 100 MG tablet TAKE 1 TABLET BY MOUTH 3 TIMES A DAY 270 tablet 9  . KLOR-CON M20 20 MEQ tablet TAKE 1 TABLET BY MOUTH TWICE A DAY 180 tablet 3  . losartan (COZAAR) 100 MG tablet Take 1 tablet (100 mg total) by mouth daily. 180 tablet 3  . pantoprazole (PROTONIX) 40 MG tablet Take 1 tablet (40 mg total) by mouth daily. 90 tablet 3  . traMADol (ULTRAM) 50 MG tablet Take 2 tablets (100 mg total) by mouth every 12 (twelve) hours as needed for moderate pain. 60 tablet 3  . warfarin (COUMADIN) 2.5 MG tablet TAKE 1 TABLET BY MOUTH DAILY AT 6 PM FOR 1 DOSE. TAKE 2 TABS DAILY EXCEPT 1 & 1/2 TABS ON TUESDAYS 180 tablet 3   No current facility-administered medications for this encounter.     Allergies:   Patient has no known allergies.   Social History:  The patient  reports that she has never smoked. She has never used smokeless tobacco. She reports current alcohol use. She reports that she does not use drugs.   Family History:  [name] family history includes Stroke in her brother and mother.   ROS:  Please see the history of present illness.   All other systems are personally reviewed and negative.   Exam:  Tele Health Call; Exam is subjective General:  Speaks in full  sentences. No resp difficulty. Lungs: Normal respiratory effort with conversation.  Abdomen: Non-distended per patient report Extremities: Pt denies edema. Neuro: Alert & oriented x 3.   Recent Labs: 03/04/2018: TSH 2.88 09/21/2018: ALT 16 01/11/2019: BUN 11; Creatinine, Ser 0.92; Hemoglobin 12.6; Platelets 209; Potassium 3.9; Sodium 138  Personally reviewed   Wt Readings from Last 3 Encounters:  01/13/19 76.8 kg (169 lb 6.4 oz)  09/21/18 81.7 kg (180 lb 3.2 oz)  07/21/18 82.4 kg (181 lb 9.6 oz)      ASSESSMENT AND PLAN:   1) Chronic systolic HF: NICM, s/p ICD and LVAD for DT(04/2013).  - 5+ years out NYHA I. Volume status sound stable. Continue lasix as needed.  -Unable to tolerate Entresto with low flow alarms  2) LVAD placed for DT 04/2013:  -  Failed Entresto due to diuresis and incessant low flow alarms.  - Sounds like she was dry over the weekend and had low flow. She was able to drink fluid and flow went back up.  - Continue losartan 100 mg daily. - No ASA with GI bleed and intolerance.  - Continue coumadin. No bleeding.  - INR 2.1 on 01/11/19.   Discussed dosing with PharmD personally. - VAD parameters discussed with her.  3) PAF:  - Off amiodarone.  4) HTN:  5) Chronic Anticoagulation:  - No bleeding issues. I reveiewed INR from 01/11/19. INR was stable.   - Goal INR 1.8-2.3.   COVID screen The patient does not have any symptoms that suggest any further testing/ screening at this time.  Social distancing reinforced today.  Patient Risk: After full review of this patients clinical status, I feel that they are at moderate risk for cardiac decompensation at this time.  Relevant cardiac medications were reviewed at length with the patient today. The patient does not have concerns regarding their medications at this time.   The following changes were made today:  none Recommended follow-up:  Follow up in 2 months.   Today, I have spent 15 minutes with the patient with  telehealth technology discussing the above issues .    Jeanmarie Hubert, NP  01/13/2019 11:13 AM  [place] [address] [facility] and [place] [address] [phone] (office) [phone] (fax)

## 2019-01-14 ENCOUNTER — Other Ambulatory Visit (HOSPITAL_COMMUNITY): Payer: Self-pay | Admitting: Internal Medicine

## 2019-01-14 NOTE — Telephone Encounter (Signed)
Rx was last filled 12/2017 for a year supply. Patients recent OV notes do not mention if she is suppose to continue to gabapentin. Ok to refill? Please advise.

## 2019-01-18 ENCOUNTER — Telehealth: Payer: Self-pay | Admitting: Licensed Clinical Social Worker

## 2019-01-18 NOTE — Telephone Encounter (Signed)
CSW contacted patient/caregiver to follow up on status with coronavirus and if anything needed. Patient instructed to stay home and use of proper hygiene and call if needed. CSW shared information about LVAD Support Group meeting virtually today if interested. Patient denies any concerns at this time and verbalizes understanding of process for contacting VAD Coordinators if needed. CSW continues to follow as needed. Jackie Alaisa Moffitt, LCSW, CCSW-MCS [phone] 

## 2019-01-19 ENCOUNTER — Ambulatory Visit (HOSPITAL_COMMUNITY): Payer: Self-pay | Admitting: Pharmacist

## 2019-01-19 LAB — POCT INR: INR: 2.2 (ref 2–3)

## 2019-01-22 ENCOUNTER — Other Ambulatory Visit (HOSPITAL_COMMUNITY): Payer: Self-pay | Admitting: *Deleted

## 2019-01-22 DIAGNOSIS — Z95811 Presence of heart assist device: Secondary | ICD-10-CM

## 2019-01-22 MED ORDER — GABAPENTIN 100 MG PO CAPS: ORAL_CAPSULE | ORAL | 3 refills | Status: DC

## 2019-01-26 DIAGNOSIS — Z7901 Long term (current) use of anticoagulants: Secondary | ICD-10-CM | POA: Diagnosis not present

## 2019-01-26 LAB — POCT INR: INR: 2.3 (ref 2.0–3.0)

## 2019-01-27 ENCOUNTER — Ambulatory Visit (HOSPITAL_COMMUNITY): Payer: Self-pay | Admitting: Pharmacist

## 2019-02-01 ENCOUNTER — Telehealth (HOSPITAL_COMMUNITY): Payer: Self-pay | Admitting: Licensed Clinical Social Worker

## 2019-02-01 NOTE — Telephone Encounter (Signed)
CSW contacted patient/caregiver to follow up on status with coronavirus and if anything needed. Patient instructed to stay home and use of proper hygiene and call if needed. CSW shared information about LVAD Support Group meeting virtually today if interested. Patient denies any concerns at this time and verbalizes understanding of process for contacting VAD Coordinators if needed. CSW continues to follow as needed. Jackie Urijah Arko, LCSW, CCSW-MCS [phone] 

## 2019-02-02 ENCOUNTER — Ambulatory Visit (INDEPENDENT_AMBULATORY_CARE_PROVIDER_SITE_OTHER): Payer: Medicare Other | Admitting: *Deleted

## 2019-02-02 ENCOUNTER — Ambulatory Visit (HOSPITAL_COMMUNITY): Payer: Self-pay | Admitting: Pharmacist

## 2019-02-02 DIAGNOSIS — I429 Cardiomyopathy, unspecified: Secondary | ICD-10-CM | POA: Diagnosis not present

## 2019-02-02 LAB — POCT INR: INR: 2.7 (ref 2.0–3.0)

## 2019-02-03 LAB — CUP PACEART REMOTE DEVICE CHECK
Battery Remaining Longevity: 10 mo
Battery Remaining Percentage: 10 %
Battery Voltage: 2.69 V
Brady Statistic AP VP Percent: 1 %
Brady Statistic AP VS Percent: 16 %
Brady Statistic AS VP Percent: 1 %
Brady Statistic AS VS Percent: 70 %
Brady Statistic RA Percent Paced: 3.9 %
Brady Statistic RV Percent Paced: 1.3 %
Date Time Interrogation Session: 20200602125109
HighPow Impedance: 47 Ohm
Implantable Lead Implant Date: 20110104
Implantable Lead Implant Date: 20110104
Implantable Lead Location: 753859
Implantable Lead Location: 753860
Implantable Lead Model: 7121
Implantable Pulse Generator Implant Date: 20110104
Lead Channel Impedance Value: 310 Ohm
Lead Channel Impedance Value: 330 Ohm
Lead Channel Pacing Threshold Amplitude: 0.75 V
Lead Channel Pacing Threshold Amplitude: 0.75 V
Lead Channel Pacing Threshold Pulse Width: 0.5 ms
Lead Channel Pacing Threshold Pulse Width: 0.5 ms
Lead Channel Sensing Intrinsic Amplitude: 0.9 mV
Lead Channel Sensing Intrinsic Amplitude: 9.6 mV
Lead Channel Setting Pacing Amplitude: 2 V
Lead Channel Setting Pacing Amplitude: 2.5 V
Lead Channel Setting Pacing Pulse Width: 0.5 ms
Lead Channel Setting Sensing Sensitivity: 0.5 mV
Pulse Gen Serial Number: 754815

## 2019-02-05 ENCOUNTER — Telehealth: Payer: Self-pay

## 2019-02-05 NOTE — Telephone Encounter (Signed)
Spoke with pt regarding appt on 02/08/19. Pt stated she did not have any questions at that moment and will check vitals prior to appt.

## 2019-02-08 ENCOUNTER — Telehealth (INDEPENDENT_AMBULATORY_CARE_PROVIDER_SITE_OTHER): Payer: Medicare Other | Admitting: Internal Medicine

## 2019-02-08 ENCOUNTER — Encounter: Payer: Self-pay | Admitting: Internal Medicine

## 2019-02-08 VITALS — Ht 60.0 in | Wt 168.2 lb

## 2019-02-08 DIAGNOSIS — I4901 Ventricular fibrillation: Secondary | ICD-10-CM

## 2019-02-08 DIAGNOSIS — I5022 Chronic systolic (congestive) heart failure: Secondary | ICD-10-CM | POA: Diagnosis not present

## 2019-02-08 NOTE — Progress Notes (Signed)
Electrophysiology TeleHealth Note   Due to national recommendations of social distancing due to [place] 19, an audio  telehealth visit is felt to be most appropriate for this patient at this time.  Verbal consent was obtained from the patient today.  She is unable to get virtual visit to work today.  We therefore performed a phone call for this encounter.   Date:  02/08/2019   ID:  Brianna Hanson, DOB 05-28-1935, MRN [id number]  Location: patient's home  Provider location: [address], [place]  Evaluation Performed: Follow-up visit  PCP:  Brianna Lima, MD  Cardiologist:  Dr Haroldine Laws Electrophysiologist:  Dr Rayann Heman  Chief Complaint: CHF  History of Present Illness:    Brianna Hanson is a 84 y.o. female who presents via audio conferencing for a telehealth visit today.  Since last being seen in our clinic, the patient reports doing very well.  Today, she denies symptoms of palpitations, chest pain, shortness of breath,  lower extremity edema, dizziness, presyncope, or syncope.  The patient is otherwise without complaint today.  The patient denies symptoms of fevers, chills, cough, or new SOB worrisome for COVID 19.  Past Medical History:  Diagnosis Date  . AICD (automatic cardioverter/defibrillator) present   . Anemia   . Atrial fibrillation or flutter   . Cardiac arrest - ventricular fibrillation 12/10   with successful resucitation, S/p ICD  . CHF (congestive heart failure) ([place])   . Diverticula of colon 2011  . HTN (hypertension)    moderate  . ICD (implantable cardiac defibrillator) in place    she has received appropriate therapy for VF  . Internal and external hemorrhoids without complication 4098  . Nonischemic cardiomyopathy ([place])    followed by Dr April Holding at [facility]  . Osteopenia   . Plasma cell disorder 03/20/2012  . Plasma cell disorder 03/20/2012  . Sessile colonic polyp 2011   Dr Benson Norway    Past Surgical History:  Procedure Laterality Date  .  ABDOMINAL HYSTERECTOMY    . CARDIAC CATHETERIZATION    . CARDIAC DEFIBRILLATOR PLACEMENT     by Greggory Brandy for secondary prevention of sudden death  . CHOLECYSTECTOMY N/A 2015-11-05   Procedure: LAPAROSCOPIC CHOLECYSTECTOMY;  Surgeon: Rolm Bookbinder, MD;  Location: [place];  Service: General;  Laterality: N/A;  . COLONOSCOPY W/ POLYPECTOMY  2011   Dr Benson Norway  . INSERTION OF IMPLANTABLE LEFT VENTRICULAR ASSIST DEVICE N/A 04/06/2013   Procedure: INSERTION OF IMPLANTABLE LEFT VENTRICULAR ASSIST DEVICE;  Surgeon: Gaye Pollack, MD;  Location: [place];  Service: Open Heart Surgery;  Laterality: N/A;  . INTRAOPERATIVE TRANSESOPHAGEAL ECHOCARDIOGRAM N/A 04/06/2013   Procedure: INTRAOPERATIVE TRANSESOPHAGEAL ECHOCARDIOGRAM;  Surgeon: Gaye Pollack, MD;  Location: [place] OR;  Service: Open Heart Surgery;  Laterality: N/A;  . IR GENERIC HISTORICAL  09/24/2016   IR US GUIDE VASC ACCESS RIGHT 09/24/2016 Greggory Keen, MD [facility]  . IR GENERIC HISTORICAL  09/24/2016   IR ANGIOGRAM VISCERAL SELECTIVE 09/24/2016 Greggory Keen, MD [facility]  . IR GENERIC HISTORICAL  09/24/2016   IR ANGIOGRAM VISCERAL SELECTIVE 09/24/2016 Greggory Keen, MD [facility]  . PLACEMENT OF CENTRIMAG VENTRICULAR ASSIST DEVICE Right 04/06/2013   Procedure: PLACEMENT OF CENTRIMAG VENTRICULAR ASSIST DEVICE;  Surgeon: Gaye Pollack, MD;  Location: [place];  Service: Open Heart Surgery;  Laterality: Right;    Current Outpatient Medications  Medication Sig Dispense Refill  . amLODipine (NORVASC) 10 MG tablet TAKE 1 TABLET BY MOUTH DAILY AT  HOUR OF SLEEP 90 tablet 3  . carvedilol (COREG) 6.25 MG tablet TAKE 1 TABLET BY MOUTH 2 TIMES DAILY WITH A MEAL 180 tablet 3  . Cholecalciferol (VITAMIN D3) 1000 UNIT/SPRAY LIQD Take 1 spray by mouth daily.     [name] docusate sodium (COLACE) 100 MG capsule Take 100 mg by mouth daily.    . ferrous sulfate 325 (65 FE) MG tablet Take 1 tablet (325 mg total) by mouth 2 (two) times daily with a meal. 60 tablet 6  .  furosemide (LASIX) 20 MG tablet Take 1 tablet (20 mg total) by mouth daily as needed (Only when recommended by CHF clinic.). 30 tablet 6  . gabapentin (NEURONTIN) 100 MG capsule TAKE 1 CAPSULE BY MOUTH EVERYDAY AT BEDTIME 90 capsule 3  . hydrALAZINE (APRESOLINE) 100 MG tablet TAKE 1 TABLET BY MOUTH 3 TIMES A DAY 270 tablet 9  . KLOR-CON M20 20 MEQ tablet TAKE 1 TABLET BY MOUTH TWICE A DAY 180 tablet 3  . pantoprazole (PROTONIX) 40 MG tablet Take 1 tablet (40 mg total) by mouth daily. 90 tablet 3  . traMADol (ULTRAM) 50 MG tablet Take 2 tablets (100 mg total) by mouth every 12 (twelve) hours as needed for moderate pain. 60 tablet 3  . warfarin (COUMADIN) 2.5 MG tablet TAKE 1 TABLET BY MOUTH DAILY AT 6 PM FOR 1 DOSE. TAKE 2 TABS DAILY EXCEPT 1 & 1/2 TABS ON TUESDAYS 180 tablet 3  . losartan (COZAAR) 100 MG tablet Take 1 tablet (100 mg total) by mouth daily. 180 tablet 3   No current facility-administered medications for this visit.     Allergies:   Patient has no known allergies.   Social History:  The patient  reports that she has never smoked. She has never used smokeless tobacco. She reports current alcohol use. She reports that she does not use drugs.   Family History:  [name]  family history includes Stroke in her brother and mother.   ROS:  Please see the history of present illness.   All other systems are personally reviewed and negative.    Exam:    Vital Signs:  Ht 5' (1.524 m)   Wt 168 lb 3.2 oz (76.3 kg)   BMI 32.85 kg/m   Well sounding   Labs/Other Tests and Data Reviewed:    Recent Labs: 03/04/2018: TSH 2.88 09/21/2018: ALT 16 01/11/2019: BUN 11; Creatinine, Ser 0.92; Hemoglobin 12.6; Platelets 209; Potassium 3.9; Sodium 138   Wt Readings from Last 3 Encounters:  02/08/19 168 lb 3.2 oz (76.3 kg)  01/13/19 169 lb 6.4 oz (76.8 kg)  09/21/18 180 lb 3.2 oz (81.7 kg)     Other studies personally reviewed: Additional studies/ records that were reviewed today  include: CHF clinic notes, my prior notes  Review of the above records today demonstrates: as above  Last device remote is reviewed from [place] PDF dated 02/02/2019 which reveals normal device function, no sustained arrhythmias (nonsustained atach is noted)    ASSESSMENT & PLAN:    1.  Chronic systolic dysfunction euvolemic S/p VAD Normal ICD function, though she is approaching Brianna (9 months battery remains). I have discussed whether or not to have her ICD replaced at time of Brianna.  She is clear currently that she would not like to have her ICD replaced.  She would prefer a more conservative approach.  2. Prior VF Well controlled off amiodarone No changes today  3. afib Well controlled (<1%) On coumadin  4.  COVID 19 screen The patient denies symptoms of COVID 19 at this time.  The importance of social distancing was discussed today.  Follow-up:  With CHF clinic as scheduled I will see in 9 months to further discuss ICD options.   As she has a VAD, it is not unreasonable given her advanced age to not replace her ICD once at [facility] as per her current preference. Next remote: 05/2019  Current medicines are reviewed at length with the patient today.   The patient does not have concerns regarding her medicines.  The following changes were made today:  none  Labs/ tests ordered today include:  No orders of the defined types were placed in this encounter.   Patient Risk:  after full review of this patients clinical status, I feel that they are at moderate risk at this time.  Today, I have spent 15 minutes with the patient with telehealth technology discussing CHF .    Army Fossa, MD  02/08/2019 10:08 AM     [facility] [address] [place] [address] [phone] (office) [phone] (fax)

## 2019-02-09 ENCOUNTER — Ambulatory Visit (HOSPITAL_COMMUNITY): Payer: Self-pay | Admitting: Pharmacist

## 2019-02-09 LAB — POCT INR: INR: 2 (ref 2–3)

## 2019-02-10 ENCOUNTER — Encounter: Payer: Self-pay | Admitting: Cardiology

## 2019-02-10 NOTE — Progress Notes (Signed)
Remote ICD transmission.   

## 2019-02-16 ENCOUNTER — Ambulatory Visit (HOSPITAL_COMMUNITY): Payer: Self-pay | Admitting: Pharmacist

## 2019-02-16 LAB — POCT INR: INR: 2.7 (ref 2.0–3.0)

## 2019-02-23 ENCOUNTER — Ambulatory Visit (HOSPITAL_COMMUNITY): Payer: Self-pay | Admitting: Pharmacist

## 2019-02-23 DIAGNOSIS — Z7901 Long term (current) use of anticoagulants: Secondary | ICD-10-CM | POA: Diagnosis not present

## 2019-02-23 LAB — POCT INR: INR: 2 (ref 2–3)

## 2019-03-02 ENCOUNTER — Ambulatory Visit (HOSPITAL_COMMUNITY): Payer: Self-pay | Admitting: Pharmacist

## 2019-03-02 LAB — POCT INR: INR: 2 (ref 2–3)

## 2019-03-04 ENCOUNTER — Telehealth (HOSPITAL_COMMUNITY): Payer: Self-pay | Admitting: Licensed Clinical Social Worker

## 2019-03-04 NOTE — Telephone Encounter (Signed)
CSW contacted patient/caregiver to follow up on status with coronavirus and if anything needed. Patient instructed to stay home and use of proper hygiene and call if needed. CSW shared information about LVAD Support Group meeting virtually Monday July 6th if interested. Patient denies any concerns at this time and verbalizes understanding of process for contacting VAD Coordinators if needed. CSW continues to follow as needed. Jackie Urbano Milhouse, LCSW, CCSW-MCS [phone] 

## 2019-03-09 ENCOUNTER — Ambulatory Visit (HOSPITAL_COMMUNITY): Payer: Self-pay | Admitting: Pharmacist

## 2019-03-09 LAB — POCT INR: INR: 1.9 — AB (ref 2.0–3.0)

## 2019-03-15 ENCOUNTER — Encounter (HOSPITAL_COMMUNITY): Payer: Medicare Other

## 2019-03-16 ENCOUNTER — Ambulatory Visit (HOSPITAL_COMMUNITY): Payer: Self-pay | Admitting: Pharmacist

## 2019-03-16 LAB — POCT INR: INR: 2.2 (ref 2–3)

## 2019-03-17 ENCOUNTER — Other Ambulatory Visit (HOSPITAL_COMMUNITY): Payer: Self-pay | Admitting: Adult Health

## 2019-03-17 DIAGNOSIS — I10 Essential (primary) hypertension: Secondary | ICD-10-CM

## 2019-03-23 DIAGNOSIS — Z7901 Long term (current) use of anticoagulants: Secondary | ICD-10-CM | POA: Diagnosis not present

## 2019-03-24 ENCOUNTER — Ambulatory Visit (HOSPITAL_COMMUNITY): Payer: Self-pay | Admitting: Pharmacist

## 2019-03-24 LAB — POCT INR: INR: 1.9 — AB (ref 2.0–3.0)

## 2019-03-29 ENCOUNTER — Encounter (HOSPITAL_COMMUNITY): Payer: Medicare Other

## 2019-03-29 ENCOUNTER — Telehealth (HOSPITAL_COMMUNITY): Payer: Self-pay | Admitting: Licensed Clinical Social Worker

## 2019-03-29 NOTE — Telephone Encounter (Signed)
CSW contacted patient to share information about LVAD Support Group meeting virtually today if interested. Patient  plans to attend and grateful for the reminder. Raquel Sarna, [place], [place]

## 2019-03-30 ENCOUNTER — Ambulatory Visit (HOSPITAL_COMMUNITY): Payer: Self-pay | Admitting: Pharmacist

## 2019-03-30 LAB — POCT INR: INR: 2.2 (ref 2.0–3.0)

## 2019-04-06 ENCOUNTER — Ambulatory Visit (HOSPITAL_COMMUNITY): Payer: Self-pay | Admitting: Pharmacist

## 2019-04-06 LAB — POCT INR: INR: 1.8 — AB (ref 2.0–3.0)

## 2019-04-07 ENCOUNTER — Other Ambulatory Visit (HOSPITAL_COMMUNITY): Payer: Self-pay | Admitting: *Deleted

## 2019-04-07 DIAGNOSIS — Z95811 Presence of heart assist device: Secondary | ICD-10-CM

## 2019-04-08 ENCOUNTER — Encounter (HOSPITAL_COMMUNITY): Payer: Self-pay

## 2019-04-08 ENCOUNTER — Other Ambulatory Visit (HOSPITAL_COMMUNITY): Payer: Self-pay | Admitting: *Deleted

## 2019-04-08 ENCOUNTER — Ambulatory Visit (HOSPITAL_COMMUNITY)
Admission: RE | Admit: 2019-04-08 | Discharge: 2019-04-08 | Disposition: A | Discharge status: Home / Self Care | Payer: Medicare Other | Source: Ambulatory Visit | Attending: Internal Medicine | Admitting: Internal Medicine

## 2019-04-08 ENCOUNTER — Other Ambulatory Visit: Payer: Self-pay

## 2019-04-08 VITALS — BP 103/55 | HR 86 | Temp 98.0°F | Ht 60.0 in | Wt 178.0 lb

## 2019-04-08 DIAGNOSIS — R52 Pain, unspecified: Secondary | ICD-10-CM

## 2019-04-08 DIAGNOSIS — Z7901 Long term (current) use of anticoagulants: Secondary | ICD-10-CM

## 2019-04-08 DIAGNOSIS — Z8674 Personal history of sudden cardiac arrest: Secondary | ICD-10-CM | POA: Diagnosis not present

## 2019-04-08 DIAGNOSIS — I480 Paroxysmal atrial fibrillation: Secondary | ICD-10-CM | POA: Diagnosis not present

## 2019-04-08 DIAGNOSIS — Z95811 Presence of heart assist device: Secondary | ICD-10-CM

## 2019-04-08 DIAGNOSIS — I428 Other cardiomyopathies: Secondary | ICD-10-CM | POA: Diagnosis not present

## 2019-04-08 DIAGNOSIS — I4892 Unspecified atrial flutter: Secondary | ICD-10-CM | POA: Insufficient documentation

## 2019-04-08 DIAGNOSIS — Z95810 Presence of automatic (implantable) cardiac defibrillator: Secondary | ICD-10-CM | POA: Diagnosis not present

## 2019-04-08 DIAGNOSIS — I5022 Chronic systolic (congestive) heart failure: Secondary | ICD-10-CM | POA: Diagnosis not present

## 2019-04-08 DIAGNOSIS — M8580 Other specified disorders of bone density and structure, unspecified site: Secondary | ICD-10-CM | POA: Diagnosis not present

## 2019-04-08 DIAGNOSIS — Z79899 Other long term (current) drug therapy: Secondary | ICD-10-CM | POA: Insufficient documentation

## 2019-04-08 DIAGNOSIS — I10 Essential (primary) hypertension: Secondary | ICD-10-CM | POA: Diagnosis not present

## 2019-04-08 DIAGNOSIS — D649 Anemia, unspecified: Secondary | ICD-10-CM | POA: Diagnosis not present

## 2019-04-08 DIAGNOSIS — I110 Hypertensive heart disease with heart failure: Secondary | ICD-10-CM | POA: Diagnosis not present

## 2019-04-08 LAB — COMPREHENSIVE METABOLIC PANEL
ALT: 23 U/L (ref 0–44)
AST: 27 U/L (ref 15–41)
Albumin: 3.3 g/dL — ABNORMAL LOW (ref 3.5–5.0)
Alkaline Phosphatase: 83 U/L (ref 38–126)
Anion gap: 8 (ref 5–15)
BUN: 19 mg/dL (ref 8–23)
CO2: 21 mmol/L — ABNORMAL LOW (ref 22–32)
Calcium: 9.2 mg/dL (ref 8.9–10.3)
Chloride: 106 mmol/L (ref 98–111)
Creatinine, Ser: 0.99 mg/dL (ref 0.44–1.00)
GFR calc Af Amer: >60 mL/min (ref >60)
GFR calc non Af Amer: 53 mL/min — ABNORMAL LOW (ref >60)
Glucose, Bld: 99 mg/dL (ref 70–99)
Potassium: 4.2 mmol/L (ref 3.5–5.1)
Sodium: 135 mmol/L (ref 135–145)
Total Bilirubin: 0.5 mg/dL (ref 0.3–1.2)
Total Protein: 9.2 g/dL — ABNORMAL HIGH (ref 6.5–8.1)

## 2019-04-08 LAB — LACTATE DEHYDROGENASE: LDH: 242 U/L — ABNORMAL HIGH (ref 98–192)

## 2019-04-08 LAB — CBC
HCT: 39.4 % (ref 36.0–46.0)
Hemoglobin: 12.5 g/dL (ref 12.0–15.0)
MCH: 30.3 pg (ref 26.0–34.0)
MCHC: 31.7 g/dL (ref 30.0–36.0)
MCV: 95.4 fL (ref 80.0–100.0)
Platelets: 229 10*3/uL (ref 150–400)
RBC: 4.13 MIL/uL (ref 3.87–5.11)
RDW: 15.2 % (ref 11.5–15.5)
WBC: 6.5 10*3/uL (ref 4.0–10.5)
nRBC: 0 % (ref 0.0–0.2)

## 2019-04-08 LAB — PREALBUMIN: Prealbumin: 15.5 mg/dL — ABNORMAL LOW (ref 18–38)

## 2019-04-08 LAB — PROTIME-INR
INR: 1.9 — ABNORMAL HIGH (ref 0.8–1.2)
Prothrombin Time: 21.6 seconds — ABNORMAL HIGH (ref 11.4–15.2)

## 2019-04-08 MED ORDER — TRAMADOL HCL 50 MG PO TABS: 100.0000 mg | ORAL_TABLET | Freq: Two times a day (BID) | ORAL | 3 refills | Status: DC | PRN

## 2019-04-08 NOTE — Progress Notes (Signed)
LVAD Clinic Note  Patient ID: Brianna Hanson, female   DOB: [birth year], 84 y.o.   MRN: [id number] Primary Heart Failure: Dr Haroldine Laws Cardiac Surgeon: Dr Prescott Gum  HPI: Brianna Hanson is a 84 y.o. 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 [name] (follwoed q 6 months). Underwent implantation of the HeartMate II LVAD on 04/06/13 for DT. She is not on aspirin due to dizziness. S/P lap cholecystitis 10/2015.   GI Issues 09/2016 LGIB. Angio- no source of bleeding. LVAD speed turned down to 8400  LVAD Speed Change 12/2016 Ramp ECHO -->Speed increased from 8400>8800  She presents today for regular follow up. Continues to do very well. Not as active due to [facility] being [facility] from [place] so unable to participate in walking program. Does all ADLs without problem. Occasional PI events on VAD but have improved. Trying to drink more and not taking any diuretics. Had one episode of mild BRBPR after straining and thinks it was hemorrhoidal. No further episodes. Denies orthopnea or PND. No fevers, chills or problems with driveline. No melena or neuro symptoms. No VAD alarms. Taking all meds as prescribed.    VAD Indication: Destination Therapy - age excluding and patient choice    VAD interrogation & Equipment Management: Speed: 8600 Flow: 3.6 Power: 4.2 w PI: 6.2  Alarms: none Events: 10-35 PI daily  Fixed speed 8600 Low speed limit: 8000  Primary Controller: Replace back up battery in 17 months. Back up controller: Replace back up battery in12 months.  Exit Site Care: Drive line is being maintained [employer]. Drive line exit site well healed and incorporated. The velour is fully implanted at exit site. Dressing dry and intact. No erythema or drainage. Stabilization device present and accurately applied. Pt denies fever or chills. Gave pt 9 dressing kits for home use.   Significant Events on VAD  Support:  09/21/16 GIB, Ramp echo speed decreased to 8400  01/08/17 - LOW FLOW alarms; speed increased to 8800 RPM 05/20/17>speed decreased to 8600  Device:St Jude ICD Therapies: on at 200 bpm; monitor on 110 DDD 60 Last check: 11/16/17    Past Medical History:  Diagnosis Date  . AICD (automatic cardioverter/defibrillator) present   . Anemia   . Atrial fibrillation or flutter   . Cardiac arrest - ventricular fibrillation 12/10   with successful resucitation, S/p ICD  . CHF (congestive heart failure) ([place])   . Diverticula of colon 2011  . HTN (hypertension)    moderate  . ICD (implantable cardiac defibrillator) in place    she has received appropriate therapy for VF  . Internal and external hemorrhoids without complication 3500  . Nonischemic cardiomyopathy ([place])    followed by Dr [facility] at [facility]  . Osteopenia   . Plasma cell disorder 03/20/2012  . Plasma cell disorder 03/20/2012  . Sessile colonic polyp 2011   Dr Benson [place]    Current Outpatient Medications  Medication Sig Dispense Refill  . amLODipine (NORVASC) 10 MG tablet TAKE 1 TABLET BY MOUTH EVERY DAY AT HOUR OF SLEEP 90 tablet 3  . carvedilol (COREG) 6.25 MG tablet TAKE 1 TABLET BY MOUTH 2 TIMES DAILY WITH A MEAL 180 tablet 3  . Cholecalciferol (VITAMIN D3) 1000 UNIT/SPRAY LIQD Take 1 spray by mouth daily.     [name] docusate sodium (COLACE) 100 MG capsule Take 100 mg by mouth daily.    [name]  ferrous sulfate 325 (65 FE) MG tablet Take 1 tablet (325 mg total) by mouth 2 (two) times daily with a meal. 60 tablet 6  . gabapentin (NEURONTIN) 100 MG capsule TAKE 1 CAPSULE BY MOUTH EVERYDAY AT BEDTIME 90 capsule 3  . hydrALAZINE (APRESOLINE) 100 MG tablet TAKE 1 TABLET BY MOUTH 3 TIMES A DAY 270 tablet 9  . KLOR-CON M20 20 MEQ tablet TAKE 1 TABLET BY MOUTH TWICE A DAY 180 tablet 3  . pantoprazole (PROTONIX) 40 MG tablet Take 1 tablet (40 mg total) by mouth daily. 90 tablet 3  . traMADol (ULTRAM) 50 MG tablet Take 2 tablets (100 mg  total) by mouth every 12 (twelve) hours as needed for moderate pain. 60 tablet 3  . warfarin (COUMADIN) 2.5 MG tablet TAKE 1 TABLET BY MOUTH DAILY AT 6 PM FOR 1 DOSE. TAKE 2 TABS DAILY EXCEPT 1 & 1/2 TABS ON TUESDAYS 180 tablet 3  . furosemide (LASIX) 20 MG tablet Take 1 tablet (20 mg total) by mouth daily as needed (Only when recommended by CHF clinic.). (Patient not taking: Reported on 04/08/2019) 30 tablet 6  . losartan (COZAAR) 100 MG tablet Take 1 tablet (100 mg total) by mouth daily. 180 tablet 3   No current facility-administered medications for this encounter.    No Known Allergies  Review of systems complete and found to be negative unless listed in HPI.    Vitals:   04/08/19 1316 04/08/19 1317  BP: (!) 84/0 (!) 103/55  Pulse:  86  Temp:  98 F (36.7 C)  SpO2:  96%  Weight:  80.7 kg (178 lb)  Height:  5' (1.524 m)   Vital Signs:  Temp: 98.0 Doppler Pressure:  84 Automatc BP:  103/55 (75) HR:  86 SPO2:  96% on RA  Weight: 178 lb w/o eqt Last weight: 180.2 lbs  Exam: General:  NAD.  HEENT: normal  Neck: supple. JVP not elevated.  Carotids 2+ bilat; no bruits. No lymphadenopathy or thryomegaly appreciated. Cor: LVAD hum.  Lungs: Clear. Abdomen: obese soft, nontender, non-distended. No hepatosplenomegaly. No bruits or masses. Good bowel sounds. Driveline site clean. Anchor in place.  Extremities: no cyanosis, clubbing, rash. Warm no edema  Neuro: alert & oriented x 3. No focal deficits. Moves all 4 without problem     ASSESSMENT AND PLAN:  1) Chronic systolic HF: NICM, s/p ICD and LVAD for DT(04/2013).  - now 6 years out.  - Stable NYHA I-II symptoms with VAD support - Volume status stable on exam. Encouraged to keep drinking fluids.   - Stable off lasix. Unable to tolerate Entresto with low flow alarms - Has occasional low flows but improves consistently with fluid and salt.  2) LVAD placed for DT 04/2013:  - Failed Entresto due to diuresis and incessant low  flow alarms.  - Continue losartan 100 mg daily. MAP well controlled  - No ASA with GI bleed and intolerance.  - Continue coumadin. No bleeding.  - LDH 242 - INR 1.9 Discussed dosing with PharmD personally. - VAD interrogated personally. Parameters stable. 3) PAF:  - Remains in NSR.  - Decrease amio to 100 daily. Can consider stopping eventually 4) HTN:  - Blood pressure well controlled. Continue current regimen. 5) Chronic Anticoagulation:  - As above.   - Goal INR 1.8-2.3.  - INR 1.9 Discussed dosing with PharmD personally. - Hgb stable 12.5  Total time spent 35 minutes. Over half that time spent discussing above.  Glori Bickers, MD 2:52 PM

## 2019-04-08 NOTE — Progress Notes (Signed)
Patient presents for 2 month  follow up in [facility] today with caregiver, [name].  Reports no problems with VAD equipment or concerns with drive line.  Pt denies complaints; says she is not limited from any activity she wants to perform except due to Covid 19 precautions. She and [name] have not been able to travel as they usually do.    Vital Signs:  Temp: 98.0 Doppler Pressure:  84 Automatc BP:  103/55 (75) HR:  86 SPO2:  96% on RA  Weight: 178 lb w/o eqt Last weight: 180.2 lbs  VAD Indication: Destination Therapy - age excluding and patient choice    VAD interrogation & Equipment Management: Speed: 8600 Flow: 3.6 Power: 4.2 w    PI: 6.2  Alarms: none Events: 10-35 PI daily  Fixed speed 8600 Low speed limit: 8000  Primary Controller: Replace back up battery in 17 months. Back up controller: Replace back up battery in 12 months.  Exit Site Care: Drive line is being maintained weekly by [employer]. Drive line exit site well healed and incorporated. The velour is fully implanted at exit site. Dressing dry and intact. No erythema or drainage. Stabilization device present and accurately applied. Pt denies fever or chills. Gave pt 9 dressing kits for home use.   Significant Events on VAD Support:  09/21/16 GIB, Ramp echo speed decreased to 8400  01/08/17 - LOW FLOW alarms; speed increased to 8800 RPM 05/20/17> speed decreased to 8600  Device:St Jude ICD Therapies: on at 200 bpm; monitor on 110 DDD 60 Last check: 11/16/17  BP & Labs: Doppler BP 84 -  is reflecting MAP  Hgb 12.5 - No S/S of bleeding; denies melena/BRBPR or nosebleeds during the last two months.   LDH 242 - Within the range of  150 - 300. Denies any dark urine.  6 year IIntermacs follow up completed including:  Quality of Life, KCCQ-12, and Neurocognitive trail making.   Pt attempted 6 minute walk, but was unable to complete due to wearing of mask; says she "can't breathe".   Back up  controller:  11V backup battery charged at home per husband.   Batteries Manufacture Date: Number of uses: Re-calibration  12/18/18 200 Performed by patient   Annual maintenance completed per [employer] on patient's home power module and Electrical engineer.    Batteries replaced this visit per manufacturer recommendation due to > 36 month use.    Patient Instructions: 1. No change in meds. 2. Return to [place] clinic in 2 months.  Zada Girt RN [place] Coordinator  Office: [phone]  24/7 Pager: [phone]

## 2019-04-13 ENCOUNTER — Ambulatory Visit (HOSPITAL_COMMUNITY): Payer: Self-pay | Admitting: Pharmacist

## 2019-04-13 LAB — POCT INR: INR: 2.2 (ref 2.0–3.0)

## 2019-04-20 ENCOUNTER — Ambulatory Visit (HOSPITAL_COMMUNITY): Payer: Self-pay | Admitting: Pharmacist

## 2019-04-20 DIAGNOSIS — Z7901 Long term (current) use of anticoagulants: Secondary | ICD-10-CM | POA: Diagnosis not present

## 2019-04-20 LAB — POCT INR: INR: 2.1 (ref 2.0–3.0)

## 2019-04-20 NOTE — Progress Notes (Signed)
LVAD INR 

## 2019-04-27 LAB — POCT INR: INR: 2.3 (ref 2–3)

## 2019-04-28 ENCOUNTER — Ambulatory Visit (HOSPITAL_COMMUNITY): Payer: Self-pay | Admitting: Pharmacist

## 2019-05-03 ENCOUNTER — Telehealth (HOSPITAL_COMMUNITY): Payer: Self-pay | Admitting: Licensed Clinical Social Worker

## 2019-05-03 NOTE — Telephone Encounter (Signed)
CSW contacted patient/caregiver to follow up on status with coronavirus and if anything needed. Patient instructed to stay home and use of proper hygiene and call if needed. CSW shared information about LVAD Support Group meeting virtually today if interested. Patient denies any concerns at this time and verbalizes understanding of process for contacting VAD Coordinators if needed. CSW continues to follow as needed. Jackie Kristyana Notte, LCSW, CCSW-MCS [phone] 

## 2019-05-04 ENCOUNTER — Ambulatory Visit (HOSPITAL_COMMUNITY): Payer: Self-pay | Admitting: Pharmacist

## 2019-05-04 ENCOUNTER — Ambulatory Visit (INDEPENDENT_AMBULATORY_CARE_PROVIDER_SITE_OTHER): Payer: Medicare Other | Admitting: *Deleted

## 2019-05-04 DIAGNOSIS — I5022 Chronic systolic (congestive) heart failure: Secondary | ICD-10-CM

## 2019-05-04 DIAGNOSIS — I480 Paroxysmal atrial fibrillation: Secondary | ICD-10-CM | POA: Diagnosis not present

## 2019-05-04 LAB — CUP PACEART REMOTE DEVICE CHECK
Battery Remaining Longevity: 7 mo
Battery Remaining Percentage: 7 %
Battery Voltage: 2.65 V
Brady Statistic AP VP Percent: 1 %
Brady Statistic AP VS Percent: 14 %
Brady Statistic AS VP Percent: 1 %
Brady Statistic AS VS Percent: 73 %
Brady Statistic RA Percent Paced: 3.6 %
Brady Statistic RV Percent Paced: 1.1 %
Date Time Interrogation Session: 20200901093645
HighPow Impedance: 47 Ohm
Implantable Lead Implant Date: 20110104
Implantable Lead Implant Date: 20110104
Implantable Lead Location: 753859
Implantable Lead Location: 753860
Implantable Lead Model: 7121
Implantable Pulse Generator Implant Date: 20110104
Lead Channel Impedance Value: 300 Ohm
Lead Channel Impedance Value: 310 Ohm
Lead Channel Pacing Threshold Amplitude: 0.75 V
Lead Channel Pacing Threshold Amplitude: 0.75 V
Lead Channel Pacing Threshold Pulse Width: 0.5 ms
Lead Channel Pacing Threshold Pulse Width: 0.5 ms
Lead Channel Sensing Intrinsic Amplitude: 1.3 mV
Lead Channel Sensing Intrinsic Amplitude: 10.7 mV
Lead Channel Setting Pacing Amplitude: 2 V
Lead Channel Setting Pacing Amplitude: 2.5 V
Lead Channel Setting Pacing Pulse Width: 0.5 ms
Lead Channel Setting Sensing Sensitivity: 0.5 mV
Pulse Gen Serial Number: 754815

## 2019-05-04 LAB — POCT INR: INR: 2.3 (ref 2.0–3.0)

## 2019-05-04 NOTE — Progress Notes (Signed)
LVAD INR 

## 2019-05-11 ENCOUNTER — Ambulatory Visit (HOSPITAL_COMMUNITY): Payer: Self-pay | Admitting: Pharmacist

## 2019-05-11 LAB — POCT INR: INR: 2.8 (ref 2.0–3.0)

## 2019-05-11 NOTE — Progress Notes (Signed)
LVAD INR 

## 2019-05-18 ENCOUNTER — Ambulatory Visit (HOSPITAL_COMMUNITY): Payer: Self-pay | Admitting: Pharmacist

## 2019-05-18 DIAGNOSIS — Z7901 Long term (current) use of anticoagulants: Secondary | ICD-10-CM | POA: Diagnosis not present

## 2019-05-18 LAB — POCT INR: INR: 2.1 (ref 2.0–3.0)

## 2019-05-18 NOTE — Progress Notes (Signed)
LVAD INR 

## 2019-05-18 NOTE — Progress Notes (Signed)
Remote ICD transmission.   

## 2019-05-24 ENCOUNTER — Telehealth (HOSPITAL_COMMUNITY): Payer: Self-pay | Admitting: Licensed Clinical Social Worker

## 2019-05-24 ENCOUNTER — Telehealth: Payer: Self-pay | Admitting: *Deleted

## 2019-05-24 NOTE — Telephone Encounter (Signed)
CSW contacted patient/caregiver to follow up on status with coronavirus and if anything needed. Patient instructed to stay home and use of proper hygiene and call if needed. CSW shared information about LVAD Support Group meeting virtually today if interested. Patient denies any concerns at this time and verbalizes understanding of process for contacting VAD Coordinators if needed. CSW continues to follow as needed. Jackie Stefano Trulson, LCSW, CCSW-MCS [phone] 

## 2019-05-24 NOTE — Telephone Encounter (Signed)
LMOM requesting call back to DC. Direct number given.  Increasing AT/AF burden noted in recent days, +warfarin. Presenting rhythm as of 05/24/19 at 03:42 shows AF/VS 60s-120s. Will confirm medication compliance and ask if symptomatic.

## 2019-05-25 ENCOUNTER — Ambulatory Visit (HOSPITAL_COMMUNITY): Payer: Self-pay | Admitting: Pharmacist

## 2019-05-25 LAB — POCT INR: INR: 2.6 (ref 2.0–3.0)

## 2019-05-25 NOTE — Progress Notes (Signed)
LVAD INR 

## 2019-05-25 NOTE — Telephone Encounter (Signed)
Patient returning call.

## 2019-05-26 NOTE — Telephone Encounter (Signed)
Spoke with patient. She denies symptoms with increased AT/AF burden. Confirms compliance with cardiac medications, including warfarin. Pt is overdue for f/u with Dr. Rayann Heman as she missed 02/08/19 virtual visit. Pt is agreeable to scheduling f/u. Message forwarded to scheduler.

## 2019-05-27 NOTE — Telephone Encounter (Addendum)
Reviewed with scheduler--appears patient did see Dr. Rayann Heman virtually on 02/08/19, appointment was incorrectly marked as "no show" Fish farm manager made aware).  Will request updated transmission from patient to confirm if she has converted to SR. Spoke with patient, she agrees to send a transmission today.

## 2019-05-27 NOTE — Telephone Encounter (Signed)
Manual transmission reviewed. Persistent AF noted since ~05/14/19. Spoke with patient. She continues to deny any symptoms. Advised I will route message to Dr. Rayann Heman and [name] for review and recommendations. Pt in agreement with plan, no questions at this time.

## 2019-05-28 NOTE — Telephone Encounter (Signed)
VAD team  - please Increase amio to 200 bid. See in clinic in 2 weeks thanks -db

## 2019-05-31 ENCOUNTER — Other Ambulatory Visit (HOSPITAL_COMMUNITY): Payer: Self-pay | Admitting: *Deleted

## 2019-05-31 DIAGNOSIS — Z95811 Presence of heart assist device: Secondary | ICD-10-CM

## 2019-05-31 DIAGNOSIS — I480 Paroxysmal atrial fibrillation: Secondary | ICD-10-CM

## 2019-05-31 MED ORDER — AMIODARONE HCL 200 MG PO TABS: 200.0000 mg | ORAL_TABLET | Freq: Every day | ORAL | 3 refills | Status: DC

## 2019-05-31 NOTE — Progress Notes (Signed)
Per Dr Haroldine Laws will start patient on Amiodarone 200mg  daily for persistent asymptomatic afib since mid Sept.   Will move patient's follow up appointment out 2 weeks per Dr Haroldine Laws. Patient aware of all the above and verbalized understanding.   Emerson Monte RN [place] Coordinator  Office: [phone]  24/7 Pager: [phone]

## 2019-06-01 ENCOUNTER — Ambulatory Visit (HOSPITAL_COMMUNITY): Payer: Self-pay | Admitting: Pharmacist

## 2019-06-01 LAB — POCT INR: INR: 2.6 (ref 2.0–3.0)

## 2019-06-01 NOTE — Progress Notes (Signed)
LVAD INR 

## 2019-06-03 ENCOUNTER — Encounter (HOSPITAL_COMMUNITY): Payer: Medicare Other

## 2019-06-04 ENCOUNTER — Telehealth (HOSPITAL_COMMUNITY): Payer: Self-pay | Admitting: Licensed Clinical Social Worker

## 2019-06-04 DIAGNOSIS — Z23 Encounter for immunization: Secondary | ICD-10-CM | POA: Diagnosis not present

## 2019-06-04 NOTE — Telephone Encounter (Signed)
CSW contacted caregiver to remind of virtual VAD support group on Monday and to review needs for Covid 19 public health concerns and emergency VAD pager number. Raquel Sarna, [place], [place]

## 2019-06-08 ENCOUNTER — Ambulatory Visit (HOSPITAL_COMMUNITY): Payer: Self-pay | Admitting: Pharmacist

## 2019-06-08 LAB — POCT INR: INR: 2 (ref 2.0–3.0)

## 2019-06-08 NOTE — Progress Notes (Signed)
LVAD INR 

## 2019-06-11 ENCOUNTER — Other Ambulatory Visit (HOSPITAL_COMMUNITY): Payer: Self-pay | Admitting: *Deleted

## 2019-06-11 DIAGNOSIS — Z95811 Presence of heart assist device: Secondary | ICD-10-CM

## 2019-06-11 DIAGNOSIS — I5022 Chronic systolic (congestive) heart failure: Secondary | ICD-10-CM

## 2019-06-11 DIAGNOSIS — Z7901 Long term (current) use of anticoagulants: Secondary | ICD-10-CM

## 2019-06-15 ENCOUNTER — Ambulatory Visit (HOSPITAL_COMMUNITY): Payer: Self-pay | Admitting: Pharmacist

## 2019-06-15 ENCOUNTER — Other Ambulatory Visit: Payer: Self-pay

## 2019-06-15 ENCOUNTER — Other Ambulatory Visit (HOSPITAL_COMMUNITY): Payer: Self-pay | Admitting: *Deleted

## 2019-06-15 ENCOUNTER — Encounter (HOSPITAL_COMMUNITY): Payer: Self-pay

## 2019-06-15 ENCOUNTER — Ambulatory Visit (HOSPITAL_COMMUNITY)
Admission: RE | Admit: 2019-06-15 | Discharge: 2019-06-15 | Disposition: A | Discharge status: Home / Self Care | Payer: Medicare Other | Source: Ambulatory Visit | Attending: Internal Medicine | Admitting: Internal Medicine

## 2019-06-15 ENCOUNTER — Other Ambulatory Visit: Payer: Self-pay | Admitting: Internal Medicine

## 2019-06-15 ENCOUNTER — Telehealth (HOSPITAL_COMMUNITY): Payer: Self-pay | Admitting: *Deleted

## 2019-06-15 ENCOUNTER — Other Ambulatory Visit (HOSPITAL_COMMUNITY)
Admission: RE | Admit: 2019-06-15 | Discharge: 2019-06-15 | Disposition: A | Discharge status: Home / Self Care | Payer: Medicare Other | Source: Ambulatory Visit | Attending: Internal Medicine | Admitting: Internal Medicine

## 2019-06-15 VITALS — BP 103/75 | HR 110 | Wt 182.6 lb

## 2019-06-15 DIAGNOSIS — M8580 Other specified disorders of bone density and structure, unspecified site: Secondary | ICD-10-CM | POA: Insufficient documentation

## 2019-06-15 DIAGNOSIS — I4891 Unspecified atrial fibrillation: Secondary | ICD-10-CM | POA: Diagnosis not present

## 2019-06-15 DIAGNOSIS — Z8674 Personal history of sudden cardiac arrest: Secondary | ICD-10-CM | POA: Insufficient documentation

## 2019-06-15 DIAGNOSIS — Z79899 Other long term (current) drug therapy: Secondary | ICD-10-CM | POA: Insufficient documentation

## 2019-06-15 DIAGNOSIS — I428 Other cardiomyopathies: Secondary | ICD-10-CM | POA: Insufficient documentation

## 2019-06-15 DIAGNOSIS — Z7901 Long term (current) use of anticoagulants: Secondary | ICD-10-CM | POA: Insufficient documentation

## 2019-06-15 DIAGNOSIS — I5022 Chronic systolic (congestive) heart failure: Secondary | ICD-10-CM

## 2019-06-15 DIAGNOSIS — I110 Hypertensive heart disease with heart failure: Secondary | ICD-10-CM | POA: Insufficient documentation

## 2019-06-15 DIAGNOSIS — Z95811 Presence of heart assist device: Secondary | ICD-10-CM | POA: Diagnosis not present

## 2019-06-15 DIAGNOSIS — Z95810 Presence of automatic (implantable) cardiac defibrillator: Secondary | ICD-10-CM | POA: Diagnosis not present

## 2019-06-15 DIAGNOSIS — Z20828 Contact with and (suspected) exposure to other viral communicable diseases: Secondary | ICD-10-CM | POA: Insufficient documentation

## 2019-06-15 DIAGNOSIS — I10 Essential (primary) hypertension: Secondary | ICD-10-CM

## 2019-06-15 DIAGNOSIS — I480 Paroxysmal atrial fibrillation: Secondary | ICD-10-CM | POA: Insufficient documentation

## 2019-06-15 LAB — BASIC METABOLIC PANEL
Anion gap: 10 (ref 5–15)
BUN: 10 mg/dL (ref 8–23)
CO2: 20 mmol/L — ABNORMAL LOW (ref 22–32)
Calcium: 8.9 mg/dL (ref 8.9–10.3)
Chloride: 107 mmol/L (ref 98–111)
Creatinine, Ser: 1.12 mg/dL — ABNORMAL HIGH (ref 0.44–1.00)
GFR calc Af Amer: 53 mL/min — ABNORMAL LOW (ref >60)
GFR calc non Af Amer: 46 mL/min — ABNORMAL LOW (ref >60)
Glucose, Bld: 108 mg/dL — ABNORMAL HIGH (ref 70–99)
Potassium: 4.2 mmol/L (ref 3.5–5.1)
Sodium: 137 mmol/L (ref 135–145)

## 2019-06-15 LAB — VITAMIN B12: Vitamin B-12: 291 pg/mL (ref 180–914)

## 2019-06-15 LAB — IRON AND TIBC
Iron: 40 ug/dL (ref 28–170)
Saturation Ratios: 16 % (ref 10.4–31.8)
TIBC: 256 ug/dL (ref 250–450)
UIBC: 216 ug/dL

## 2019-06-15 LAB — CBC
HCT: 37.5 % (ref 36.0–46.0)
Hemoglobin: 11.5 g/dL — ABNORMAL LOW (ref 12.0–15.0)
MCH: 30.6 pg (ref 26.0–34.0)
MCHC: 30.7 g/dL (ref 30.0–36.0)
MCV: 99.7 fL (ref 80.0–100.0)
Platelets: 228 10*3/uL (ref 150–400)
RBC: 3.76 MIL/uL — ABNORMAL LOW (ref 3.87–5.11)
RDW: 16 % — ABNORMAL HIGH (ref 11.5–15.5)
WBC: 5.8 10*3/uL (ref 4.0–10.5)
nRBC: 0 % (ref 0.0–0.2)

## 2019-06-15 LAB — FOLATE: Folate: 13.2 ng/mL (ref >5.9)

## 2019-06-15 LAB — PROTIME-INR
INR: 2.2 — ABNORMAL HIGH (ref 0.8–1.2)
Prothrombin Time: 23.7 seconds — ABNORMAL HIGH (ref 11.4–15.2)

## 2019-06-15 LAB — FERRITIN: Ferritin: 39 ng/mL (ref 11–307)

## 2019-06-15 LAB — LACTATE DEHYDROGENASE: LDH: 208 U/L — ABNORMAL HIGH (ref 98–192)

## 2019-06-15 MED ORDER — LOSARTAN POTASSIUM 100 MG PO TABS: 100.0000 mg | ORAL_TABLET | Freq: Every day | ORAL | 3 refills | Status: DC

## 2019-06-15 MED ORDER — FUROSEMIDE 20 MG PO TABS: 20.0000 mg | ORAL_TABLET | Freq: Every day | ORAL | 6 refills | Status: DC | PRN

## 2019-06-15 NOTE — Progress Notes (Addendum)
Patient presents for 2 month  follow up in [facility] today with caregiver, [name].  Reports no problems with VAD equipment or concerns with drive line.  Pt denies complaints; says she is not limited from any activity she wants to perform except due to Covid 19 precautions. She and [name] have not been able to travel as they usually do. They are both participating in the walking club. Naola is able to walk 3 laps at a time.   Pt has been taking Amio 200mg  daily since 9/28 remote device check 9/24 showed persistent afib. She is in afib today with a rate 110-135. SHe reports that she feels "jittery" intermittently. This is worse at night, but has improved on the Amio. EKG and St Jude device interrogation completed and reviewed by Dr Haroldine Laws. Will schedule for cardioversion later this week.  Several low flows noted on VAD interrogation. She reports being asymptomatic with low flows. She is drinking 2L daily.   Vital Signs:  Temp:  Doppler Pressure:  98 Automatc BP:  103/75 (84) HR: 110-135 afib  SPO2:  97% on RA  Weight: 182.6 lb w/o eqt Last weight: 178 lbs  VAD Indication: Destination Therapy - age excluding and patient choice    VAD interrogation & Equipment Management: Speed: 8600 Flow: 3.8 Power: 4.2 w    PI: 6.1  Alarms: Frequent low flows: 10/11: 4 low flows, 10/10: 1 low flow, 10/9: 1 low flow, 10/08: 25 low flows, 10/7: 16 low flows Events: 10-35 PI daily  Fixed speed 8600 Low speed limit: 8000  Primary Controller: Replace back up battery in 15 months. Back up controller: Replace back up battery in 10 months.  Exit Site Care: Drive line is being maintained weekly by [employer]. Drive line exit site well healed and incorporated. The velour is fully implanted at exit site. Dressing dry and intact. No erythema or drainage. Stabilization device present and accurately applied. Pt denies fever or chills. Gave pt 8 dressing kits for home use.   Significant Events on VAD  Support:  09/21/16 GIB, Ramp echo speed decreased to 8400  01/08/17 - LOW FLOW alarms; speed increased to 8800 RPM 05/20/17> speed decreased to 8600  Device:St Jude ICD Therapies: on at 200 bpm; monitor on 110 DDD 60 Last check: 05/27/19  BP & Labs: Doppler BP 98 -  is reflecting modified systolic  Hgb 0000000 - No S/S of bleeding; denies melena/BRBPR or nosebleeds during the last two months.   LDH 208 - Within the range of  150 - 300. Denies any dark urine.  Patient Instructions: 1. No change in meds.  2. Take a dose of Lasix 20mg  today. 3. We will get you scheduled for cardioversion later this week. You are scheduled for COVID test today at 10:55 at [facility].  4. Coumadin dosing per Audry Riles PharmD- will continue current regimen  5. Return to [place] clinic in 1 month.  Emerson Monte RN [place] Coordinator  Office: [phone]  24/7 Pager: [phone]

## 2019-06-15 NOTE — Telephone Encounter (Signed)
Left VM re: cardioversion scheduled Friday 10/16 at 0930. Instructed NPO after MN and to check in at admitting by 0830. Requested call back with any questions.   Emerson Monte RN [place] Coordinator  Office: [phone]  24/7 Pager: [phone]

## 2019-06-15 NOTE — Progress Notes (Signed)
LVAD INR 

## 2019-06-15 NOTE — Patient Instructions (Addendum)
1. No change in meds.  2. Take a dose of Lasix 20mg  today. 3. We will get you scheduled for cardioversion later this week. You are scheduled for COVID test today at 10:55 at the [facility] (old [facility] campus.  4. Coumadin dosing per Audry Riles PharmD- will continue current regimen  5. Return to [place] clinic in 1 month.

## 2019-06-16 LAB — NOVEL CORONAVIRUS, NAA (HOSP ORDER, SEND-OUT TO REF LAB; TAT 18-24 HRS): SARS-CoV-2, NAA: NOT DETECTED

## 2019-06-16 NOTE — Progress Notes (Signed)
LVAD Clinic Note  Patient ID: Brianna Hanson, female   DOB: 05/06/1935, 84 y.o.   MRN: [id number] Primary Heart Failure: Dr Haroldine Laws Cardiac Surgeon: Dr Prescott Gum  HPI: Brianna Hanson is a 84 y.o. 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 [name] (follwoed q 6 months). Underwent implantation of the HeartMate II LVAD on 04/06/13 for DT. She is not on aspirin due to dizziness. S/P lap cholecystitis 10/2015.   GI Issues 09/2016 LGIB. Angio- no source of bleeding. LVAD speed turned down to 8400  LVAD Speed Change 12/2016 Ramp ECHO -->Speed increased from 8400>8800   Here for f/u:   Recently found to be in recurrent AF on device interrogation and amio increased to 200 bid. Says she feels jittery. Still walking with walking club but finds it a little more difficult. + edema. No orthopnea or PND. Denies orthopnea or PND. No fevers, chills or problems with driveline. No bleeding, melena or neuro symptoms. + multiple low flows on VAD Taking all meds as prescribed.     VAD Indication: Destination Therapy - age excluding and patient choice    VAD interrogation & Equipment Management: Speed: 8600 Flow: 3.8 Power: 4.2 w PI: 6.1  Alarms: Frequent low flows: 10/11: 4 low flows, 10/10: 1 low flow, 10/9: 1 low flow, 10/08: 25 low flows, 10/7: 16 low flows Events: 10-35 PI daily  Fixed speed 8600 Low speed limit: 8000  Exit Site Care: Drive line is being maintained [employer]. Drive line exit site well healed and incorporated. The velour is fully implanted at exit site. Dressing dry and intact. No erythema or drainage. Stabilization device present and accurately applied. Pt denies fever or chills. Gave pt 9 dressing kits for home use.   Significant Events on VAD Support:  09/21/16 GIB, Ramp echo speed decreased to 8400  01/08/17 - LOW FLOW alarms; speed increased to 8800 RPM 05/20/17>speed  decreased to 8600  Device:St Jude ICD Therapies: on at 200 bpm; monitor on 110 DDD 60 Last check: 11/16/17    Past Medical History:  Diagnosis Date  . AICD (automatic cardioverter/defibrillator) present   . Anemia   . Atrial fibrillation or flutter   . Cardiac arrest - ventricular fibrillation 12/10   with successful resucitation, S/p ICD  . CHF (congestive heart failure) ([place])   . Diverticula of colon 2011  . HTN (hypertension)    moderate  . ICD (implantable cardiac defibrillator) in place    she has received appropriate therapy for VF  . Internal and external hemorrhoids without complication [id number]  . Nonischemic cardiomyopathy ([place])    followed by Dr April Holding at [facility]  . Osteopenia   . Plasma cell disorder 03/20/2012  . Plasma cell disorder 03/20/2012  . Sessile colonic polyp 2011   Dr Benson Norway    Current Outpatient Medications  Medication Sig Dispense Refill  . amiodarone (PACERONE) 200 MG tablet Take 1 tablet (200 mg total) by mouth daily. 90 tablet 3  . amLODipine (NORVASC) 10 MG tablet TAKE 1 TABLET BY MOUTH EVERY DAY AT HOUR OF SLEEP 90 tablet 3  . carvedilol (COREG) 6.25 MG tablet TAKE 1 TABLET BY MOUTH 2 TIMES DAILY WITH A MEAL (Patient taking differently: Take 6.25 mg by mouth 2 (two) times daily with a meal. ) 180 tablet 3  . Cholecalciferol (VITAMIN D3) 25 MCG (1000 UT) CAPS Take 1,000 Units by  mouth daily.     [name] docusate sodium (COLACE) 100 MG capsule Take 100 mg by mouth daily.    . ferrous sulfate 325 (65 FE) MG tablet Take 1 tablet (325 mg total) by mouth 2 (two) times daily with a meal. 60 tablet 6  . gabapentin (NEURONTIN) 100 MG capsule TAKE 1 CAPSULE BY MOUTH EVERYDAY AT BEDTIME 90 capsule 3  . hydrALAZINE (APRESOLINE) 100 MG tablet TAKE 1 TABLET BY MOUTH 3 TIMES A DAY 270 tablet 9  . KLOR-CON M20 20 MEQ tablet TAKE 1 TABLET BY MOUTH TWICE A DAY 180 tablet 3  . pantoprazole (PROTONIX) 40 MG tablet Take 1 tablet (40 mg total) by mouth daily. 90 tablet 3  .  traMADol (ULTRAM) 50 MG tablet Take 2 tablets (100 mg total) by mouth every 12 (twelve) hours as needed for moderate pain. 60 tablet 3  . warfarin (COUMADIN) 2.5 MG tablet TAKE 1 TABLET BY MOUTH DAILY AT 6 PM FOR 1 DOSE. TAKE 2 TABS DAILY EXCEPT 1 & 1/2 TABS ON TUESDAYS (Patient taking differently: Take 2.5-5 mg by mouth See admin instructions. Take 2.5 mg (1 tab) every Tues/Thurs/Sat and 5 mg (2 tabs) all other days or as directed by HF clinic) 180 tablet 3  . furosemide (LASIX) 20 MG tablet Take 1 tablet (20 mg total) by mouth daily as needed (Only when recommended by CHF clinic.). 30 tablet 6  . losartan (COZAAR) 100 MG tablet Take 1 tablet (100 mg total) by mouth daily. 90 tablet 3   No current facility-administered medications for this encounter.    No Known Allergies  Review of systems complete and found to be negative unless listed in HPI.    Vitals:   06/15/19 0914 06/15/19 0916  BP: (!) 98/0 103/75  Pulse: (!) 110   SpO2: 97%   Weight: 82.8 kg (182 lb 9.6 oz)    Vital Signs:  Temp:  Doppler Pressure:  98 Automatc BP:  103/75 (84) HR: 110-135 afib  SPO2:  97% on RA  Weight: 182.6 lb w/o eqt Last weight: 178 lbs  General:  NAD.  HEENT: normal  Neck: supple. JVP 8-9.  Carotids 2+ bilat; no bruits. No lymphadenopathy or thryomegaly appreciated. Cor: LVAD hum.  Lungs: Clear. Abdomen: soft, nontender, non-distended. No hepatosplenomegaly. No bruits or masses. Good bowel sounds. Driveline site clean. Anchor in place.  Extremities: no cyanosis, clubbing, rash. 1+  edema  Neuro: alert & oriented x 3. No focal deficits. Moves all 4 without problem   ECG: AF 104 Personally reviewed  ASSESSMENT AND PLAN:  1) Chronic systolic HF: NICM, s/p ICD and LVAD for DT(04/2013).  - now 6 years out.  - Slightly worse NYHA II symptoms in setting of recurrent AF - Volume status elevated liekly due to RV failure in setting of AF - has been off lasix due to low flows but can take a dose as  needed. - will plan DC-CV ASAP  2) LVAD placed for DT 04/2013:  - Failed Entresto due to diuresis and incessant low flow alarms.  - Continue losartan 100 mg daily. MAP ok - No ASA with GI bleed and intolerance.  - Continue coumadin. No bleeding.  - LDH 208 - INR 2.2 Discussed dosing with PharmD personally. - VAD interrogated personally. Multiple low flows 3) PAF:  - Back in AF with increasing HF symptoms - Amio at 200 bid with increased jitteriness - will decrease amio to 200 daily - INRs therapeutic - Plan DC-CV this  week  - ICD interrogation today confirms AF 4) HTN:  - Blood pressure well controlled. Continue current regimen. 5) Chronic Anticoagulation:  - As above.   - Goal INR 1.8-2.3.  - INR 2.2 Discussed dosing with PharmD personally. - Hgb stable 11.5  Total time spent 45 minutes. Over half that time spent discussing above.     Glori Bickers, MD 4:19 PM

## 2019-06-16 NOTE — Addendum Note (Signed)
Encounter addended by: Jolaine Artist, MD on: 06/16/2019 4:42 PM  Actions taken: Charge Capture section accepted

## 2019-06-16 NOTE — H&P (View-Only) (Signed)
LVAD Clinic Note  Patient ID: CHALENA POPPY, female   DOB: 01-10-1935, 85 y.o.   MRN: [id number] Primary Heart Failure: Dr Haroldine Laws Cardiac Surgeon: Dr Prescott Gum  HPI: DEVENY PEGUES is a 84 y.o. 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 [name] (follwoed q 6 months). Underwent implantation of the HeartMate II LVAD on 04/06/13 for DT. She is not on aspirin due to dizziness. S/P lap cholecystitis 10/2015.   GI Issues 09/2016 LGIB. Angio- no source of bleeding. LVAD speed turned down to 8400  LVAD Speed Change 12/2016 Ramp ECHO -->Speed increased from 8400>8800   Here for f/u:   Recently found to be in recurrent AF on device interrogation and amio increased to 200 bid. Says she feels jittery. Still walking with walking club but finds it a little more difficult. + edema. No orthopnea or PND. Denies orthopnea or PND. No fevers, chills or problems with driveline. No bleeding, melena or neuro symptoms. + multiple low flows on VAD Taking all meds as prescribed.     VAD Indication: Destination Therapy - age excluding and patient choice    VAD interrogation & Equipment Management: Speed: 8600 Flow: 3.8 Power: 4.2 w PI: 6.1  Alarms: Frequent low flows: 10/11: 4 low flows, 10/10: 1 low flow, 10/9: 1 low flow, 10/08: 25 low flows, 10/7: 16 low flows Events: 10-35 PI daily  Fixed speed 8600 Low speed limit: 8000  Exit Site Care: Drive line is being maintained [employer]. Drive line exit site well healed and incorporated. The velour is fully implanted at exit site. Dressing dry and intact. No erythema or drainage. Stabilization device present and accurately applied. Pt denies fever or chills. Gave pt 9 dressing kits for home use.   Significant Events on VAD Support:  09/21/16 GIB, Ramp echo speed decreased to 8400  01/08/17 - LOW FLOW alarms; speed increased to 8800 RPM 05/20/17>speed  decreased to 8600  Device:St Jude ICD Therapies: on at 200 bpm; monitor on 110 DDD 60 Last check: 11/16/17    Past Medical History:  Diagnosis Date  . AICD (automatic cardioverter/defibrillator) present   . Anemia   . Atrial fibrillation or flutter   . Cardiac arrest - ventricular fibrillation 12/10   with successful resucitation, S/p ICD  . CHF (congestive heart failure) ([place])   . Diverticula of colon 2011  . HTN (hypertension)    moderate  . ICD (implantable cardiac defibrillator) in place    she has received appropriate therapy for VF  . Internal and external hemorrhoids without complication [id number]  . Nonischemic cardiomyopathy ([place])    followed by Dr April Holding at [facility]  . Osteopenia   . Plasma cell disorder 03/20/2012  . Plasma cell disorder 03/20/2012  . Sessile colonic polyp 2011   Dr Benson [place]    Current Outpatient Medications  Medication Sig Dispense Refill  . amiodarone (PACERONE) 200 MG tablet Take 1 tablet (200 mg total) by mouth daily. 90 tablet 3  . amLODipine (NORVASC) 10 MG tablet TAKE 1 TABLET BY MOUTH EVERY DAY AT HOUR OF SLEEP 90 tablet 3  . carvedilol (COREG) 6.25 MG tablet TAKE 1 TABLET BY MOUTH 2 TIMES DAILY WITH A MEAL (Patient taking differently: Take 6.25 mg by mouth 2 (two) times daily with a meal. ) 180 tablet 3  . Cholecalciferol (VITAMIN D3) 25 MCG (1000 UT) CAPS Take 1,000 Units by  mouth daily.     [name] docusate sodium (COLACE) 100 MG capsule Take 100 mg by mouth daily.    . ferrous sulfate 325 (65 FE) MG tablet Take 1 tablet (325 mg total) by mouth 2 (two) times daily with a meal. 60 tablet 6  . gabapentin (NEURONTIN) 100 MG capsule TAKE 1 CAPSULE BY MOUTH EVERYDAY AT BEDTIME 90 capsule 3  . hydrALAZINE (APRESOLINE) 100 MG tablet TAKE 1 TABLET BY MOUTH 3 TIMES A DAY 270 tablet 9  . KLOR-CON M20 20 MEQ tablet TAKE 1 TABLET BY MOUTH TWICE A DAY 180 tablet 3  . pantoprazole (PROTONIX) 40 MG tablet Take 1 tablet (40 mg total) by mouth daily. 90 tablet 3  .  traMADol (ULTRAM) 50 MG tablet Take 2 tablets (100 mg total) by mouth every 12 (twelve) hours as needed for moderate pain. 60 tablet 3  . warfarin (COUMADIN) 2.5 MG tablet TAKE 1 TABLET BY MOUTH DAILY AT 6 PM FOR 1 DOSE. TAKE 2 TABS DAILY EXCEPT 1 & 1/2 TABS ON TUESDAYS (Patient taking differently: Take 2.5-5 mg by mouth See admin instructions. Take 2.5 mg (1 tab) every Tues/Thurs/Sat and 5 mg (2 tabs) all other days or as directed by HF clinic) 180 tablet 3  . furosemide (LASIX) 20 MG tablet Take 1 tablet (20 mg total) by mouth daily as needed (Only when recommended by CHF clinic.). 30 tablet 6  . losartan (COZAAR) 100 MG tablet Take 1 tablet (100 mg total) by mouth daily. 90 tablet 3   No current facility-administered medications for this encounter.    No Known Allergies  Review of systems complete and found to be negative unless listed in HPI.    Vitals:   06/15/19 0914 06/15/19 0916  BP: (!) 98/0 103/75  Pulse: (!) 110   SpO2: 97%   Weight: 82.8 kg (182 lb 9.6 oz)    Vital Signs:  Temp:  Doppler Pressure:  98 Automatc BP:  103/75 (84) HR: 110-135 afib  SPO2:  97% on RA  Weight: 182.6 lb w/o eqt Last weight: 178 lbs  General:  NAD.  HEENT: normal  Neck: supple. JVP 8-9.  Carotids 2+ bilat; no bruits. No lymphadenopathy or thryomegaly appreciated. Cor: LVAD hum.  Lungs: Clear. Abdomen: soft, nontender, non-distended. No hepatosplenomegaly. No bruits or masses. Good bowel sounds. Driveline site clean. Anchor in place.  Extremities: no cyanosis, clubbing, rash. 1+  edema  Neuro: alert & oriented x 3. No focal deficits. Moves all 4 without problem   ECG: AF 104 Personally reviewed  ASSESSMENT AND PLAN:  1) Chronic systolic HF: NICM, s/p ICD and LVAD for DT(04/2013).  - now 6 years out.  - Slightly worse NYHA II symptoms in setting of recurrent AF - Volume status elevated liekly due to RV failure in setting of AF - has been off lasix due to low flows but can take a dose as  needed. - will plan DC-CV ASAP  2) LVAD placed for DT 04/2013:  - Failed Entresto due to diuresis and incessant low flow alarms.  - Continue losartan 100 mg daily. MAP ok - No ASA with GI bleed and intolerance.  - Continue coumadin. No bleeding.  - LDH 208 - INR 2.2 Discussed dosing with PharmD personally. - VAD interrogated personally. Multiple low flows 3) PAF:  - Back in AF with increasing HF symptoms - Amio at 200 bid with increased jitteriness - will decrease amio to 200 daily - INRs therapeutic - Plan DC-CV this  week  - ICD interrogation today confirms AF 4) HTN:  - Blood pressure well controlled. Continue current regimen. 5) Chronic Anticoagulation:  - As above.   - Goal INR 1.8-2.3.  - INR 2.2 Discussed dosing with PharmD personally. - Hgb stable 11.5  Total time spent 45 minutes. Over half that time spent discussing above.     Glori Bickers, MD 4:19 PM

## 2019-06-16 NOTE — Addendum Note (Signed)
Encounter addended by: Jolaine Artist, MD on: 06/16/2019 4:27 PM  Actions taken: Clinical Note Signed, Level of Service modified, Charge Capture section accepted

## 2019-06-17 NOTE — Progress Notes (Signed)
Spoke with patient who states that she has remained in quarantine and is not experiencing any s/s of COVID 19. All questions answered appropriately. Jobe Igo, RN

## 2019-06-18 ENCOUNTER — Other Ambulatory Visit: Payer: Self-pay

## 2019-06-18 ENCOUNTER — Encounter (HOSPITAL_COMMUNITY): Payer: Self-pay | Admitting: Certified Registered Nurse Anesthetist

## 2019-06-18 ENCOUNTER — Other Ambulatory Visit (HOSPITAL_COMMUNITY): Payer: Self-pay | Admitting: Unknown Physician Specialty

## 2019-06-18 ENCOUNTER — Encounter: Payer: Self-pay | Admitting: *Deleted

## 2019-06-18 ENCOUNTER — Ambulatory Visit (HOSPITAL_COMMUNITY): Payer: Medicare Other | Admitting: Certified Registered Nurse Anesthetist

## 2019-06-18 ENCOUNTER — Ambulatory Visit (HOSPITAL_COMMUNITY)
Admission: RE | Admit: 2019-06-18 | Discharge: 2019-06-18 | Disposition: A | Discharge status: Home / Self Care | Payer: Medicare Other | Attending: Internal Medicine | Admitting: Internal Medicine

## 2019-06-18 ENCOUNTER — Encounter (HOSPITAL_COMMUNITY)
Admission: RE | Disposition: A | Discharge status: Home / Self Care | Payer: Self-pay | Source: Home / Self Care | Attending: Internal Medicine

## 2019-06-18 DIAGNOSIS — I130 Hypertensive heart and chronic kidney disease with heart failure and stage 1 through stage 4 chronic kidney disease, or unspecified chronic kidney disease: Secondary | ICD-10-CM | POA: Diagnosis not present

## 2019-06-18 DIAGNOSIS — Z79899 Other long term (current) drug therapy: Secondary | ICD-10-CM | POA: Insufficient documentation

## 2019-06-18 DIAGNOSIS — I428 Other cardiomyopathies: Secondary | ICD-10-CM | POA: Diagnosis not present

## 2019-06-18 DIAGNOSIS — M8580 Other specified disorders of bone density and structure, unspecified site: Secondary | ICD-10-CM | POA: Diagnosis not present

## 2019-06-18 DIAGNOSIS — Z95810 Presence of automatic (implantable) cardiac defibrillator: Secondary | ICD-10-CM | POA: Diagnosis not present

## 2019-06-18 DIAGNOSIS — I480 Paroxysmal atrial fibrillation: Secondary | ICD-10-CM | POA: Insufficient documentation

## 2019-06-18 DIAGNOSIS — Z7901 Long term (current) use of anticoagulants: Secondary | ICD-10-CM | POA: Insufficient documentation

## 2019-06-18 DIAGNOSIS — I5022 Chronic systolic (congestive) heart failure: Secondary | ICD-10-CM | POA: Diagnosis not present

## 2019-06-18 DIAGNOSIS — I110 Hypertensive heart disease with heart failure: Secondary | ICD-10-CM | POA: Diagnosis not present

## 2019-06-18 DIAGNOSIS — N182 Chronic kidney disease, stage 2 (mild): Secondary | ICD-10-CM | POA: Diagnosis not present

## 2019-06-18 DIAGNOSIS — I4891 Unspecified atrial fibrillation: Secondary | ICD-10-CM

## 2019-06-18 DIAGNOSIS — Z8674 Personal history of sudden cardiac arrest: Secondary | ICD-10-CM | POA: Diagnosis not present

## 2019-06-18 DIAGNOSIS — D649 Anemia, unspecified: Secondary | ICD-10-CM

## 2019-06-18 HISTORY — PX: CARDIOVERSION: SHX1299

## 2019-06-18 LAB — PROTIME-INR
INR: 2.2 — ABNORMAL HIGH (ref 0.8–1.2)
Prothrombin Time: 24.4 seconds — ABNORMAL HIGH (ref 11.4–15.2)

## 2019-06-18 SURGERY — CARDIOVERSION
Anesthesia: General

## 2019-06-18 MED ORDER — SODIUM CHLORIDE 0.9 % IV SOLN: 510.0000 mg | INTRAVENOUS | Status: DC

## 2019-06-18 MED ORDER — PHENYLEPHRINE 40 MCG/ML (10ML) SYRINGE FOR IV PUSH (FOR BLOOD PRESSURE SUPPORT)
PREFILLED_SYRINGE | INTRAVENOUS | Status: DC | PRN
  Administered 2019-06-18: 120 ug via INTRAVENOUS
  Administered 2019-06-18: 80 ug via INTRAVENOUS

## 2019-06-18 MED ORDER — SODIUM CHLORIDE 0.9 % IV SOLN
INTRAVENOUS | Status: DC
  Administered 2019-06-18: 09:00:00 via INTRAVENOUS

## 2019-06-18 MED ORDER — PROPOFOL 10 MG/ML IV BOLUS
INTRAVENOUS | Status: DC | PRN
  Administered 2019-06-18: 40 mg via INTRAVENOUS

## 2019-06-18 MED ORDER — LIDOCAINE 2% (20 MG/ML) 5 ML SYRINGE
INTRAMUSCULAR | Status: DC | PRN
  Administered 2019-06-18: 60 mg via INTRAVENOUS

## 2019-06-18 NOTE — Anesthesia Postprocedure Evaluation (Signed)
Anesthesia Post Note  Patient: Brianna Hanson  Procedure(s) Performed: CARDIOVERSION (N/A )     Patient location during evaluation: Endoscopy Anesthesia Type: General Level of consciousness: awake and alert Pain management: pain level controlled Vital Signs Assessment: post-procedure vital signs reviewed and stable Respiratory status: spontaneous breathing, nonlabored ventilation and respiratory function stable Cardiovascular status: blood pressure returned to baseline and stable Postop Assessment: no apparent nausea or vomiting Anesthetic complications: no    Last Vitals:  Vitals:   06/18/19 1031 06/18/19 1035  BP: 101/72 (!) 91/55  Pulse: 65   Resp: 18 (!) 21  Temp:    SpO2: 99%     Last Pain:  Vitals:   06/18/19 0903  TempSrc: Oral  PainSc: 0-No pain                 Yadriel Kerrigan,W. EDMOND

## 2019-06-18 NOTE — Anesthesia Preprocedure Evaluation (Addendum)
Anesthesia Evaluation  Patient identified by MRN, date of birth, ID band Patient awake    Reviewed: Allergy & Precautions, H&P , NPO status , Patient's Chart, lab work & pertinent test results, reviewed documented beta blocker date and time   Airway Mallampati: II  TM Distance: >3 FB Neck ROM: Full    Dental no notable dental hx. (+) Upper Dentures, Lower Dentures, Dental Advisory Given   Pulmonary neg pulmonary ROS,    Pulmonary exam normal breath sounds clear to auscultation       Cardiovascular hypertension, Pt. on medications and Pt. on home beta blockers +CHF  + Cardiac Defibrillator  Rhythm:Irregular Rate:Normal     Neuro/Psych negative neurological ROS  negative psych ROS   GI/Hepatic negative GI ROS, Neg liver ROS,   Endo/Other  negative endocrine ROS  Renal/GU Renal disease  negative genitourinary   Musculoskeletal  (+) Arthritis , Osteoarthritis,    Abdominal   Peds  Hematology  (+) Blood dyscrasia, anemia ,   Anesthesia Other Findings   Reproductive/Obstetrics negative OB ROS                            Anesthesia Physical Anesthesia Plan  ASA: IV  Anesthesia Plan: General   Post-op Pain Management:    Induction: Intravenous  PONV Risk Score and Plan: 3 and Treatment may vary due to age or medical condition  Airway Management Planned: Mask  Additional Equipment:   Intra-op Plan:   Post-operative Plan:   Informed Consent: I have reviewed the patients History and Physical, chart, labs and discussed the procedure including the risks, benefits and alternatives for the proposed anesthesia with the patient or authorized representative who has indicated his/her understanding and acceptance.     Dental advisory given  Plan Discussed with: CRNA  Anesthesia Plan Comments:         Anesthesia Quick Evaluation

## 2019-06-18 NOTE — Progress Notes (Signed)
VAD Coordinator Procedure Note:   VAD Coordinator met patient in endoscopy. Pt undergoing cardioversion per [name]. Hemodynamics and VAD parameters monitored by myself and anesthesia throughout the procedure. Blood pressures were obtained with automatic cuff on left arm and correlated with Doppler.   Time: Doppler Auto  BP Flow PI Power Speed  Pre-procedure:  0900 100 89/75 (75) 3.2 6.2 4.0 8600                    Sedation Induction: 1000  91/76 (79) 3.5 6.4 4.1 8600  Defib x1 200j 1003   3.0 3.7 3.8 8600  Neo bolus 1004  57/37 (41)       1010 84 101/71 (82) 3.8 6.1 4.2 8600  Recovery Area: 1020  101/76 (82) 3.4 6.6 4.0 8600   1030  101/72 (81) 3.6 6.5 4.2 8600            Patient tolerated the procedure well. VAD Coordinator accompanied and remained with patient in recovery area.    Patient Disposition:   Patient in SR post cardioversion. Will discharge home per Dr Haroldine Laws. Follow up in VAD clinic in 2 weeks.

## 2019-06-18 NOTE — Transfer of Care (Signed)
Immediate Anesthesia Transfer of Care Note  Patient: Brianna Hanson  Procedure(s) Performed: CARDIOVERSION (N/A )  Patient Location: Endoscopy Unit  Anesthesia Type:General  Level of Consciousness: awake and patient cooperative  Airway & Oxygen Therapy: Patient Spontanous Breathing and Patient connected to nasal cannula oxygen  Post-op Assessment: Report given to RN and Post -op Vital signs reviewed and stable  Post vital signs: Reviewed and stable  Last Vitals:  Vitals Value Taken Time  BP    Temp    Pulse    Resp    SpO2      Last Pain:  Vitals:   06/18/19 0903  TempSrc: Oral  PainSc: 0-No pain         Complications: No apparent anesthesia complications

## 2019-06-18 NOTE — Interval H&P Note (Signed)
History and Physical Interval Note:  06/18/2019 9:28 AM  [name]  has presented today for surgery, with the diagnosis of atrial fibrillation.  The various methods of treatment have been discussed with the patient and family. After consideration of risks, benefits and other options for treatment, the patient has consented to  Procedure(s): CARDIOVERSION (N/A) as a surgical intervention.  [name] history has been reviewed, patient examined, no change in status, stable for surgery.  I have reviewed the patient's chart and labs.  Questions were answered to the patient's satisfaction.     Brianna Hanson

## 2019-06-18 NOTE — Anesthesia Procedure Notes (Signed)
Procedure Name: General with mask airway Date/Time: 06/18/2019 10:02 AM Performed by: Lowella Dell, CRNA Pre-anesthesia Checklist: Patient identified, Emergency Drugs available, Suction available, Patient being monitored and Timeout performed Patient Re-evaluated:Patient Re-evaluated prior to induction Oxygen Delivery Method: Ambu bag Preoxygenation: Pre-oxygenation with 100% oxygen Induction Type: IV induction Ventilation: Mask ventilation without difficulty Placement Confirmation: positive ETCO2 Dental Injury: Teeth and Oropharynx as per pre-operative assessment

## 2019-06-18 NOTE — CV Procedure (Signed)
    DIRECT CURRENT CARDIOVERSION  NAME:  Brianna Hanson   MRN: [id number] DOB:  1935-02-03   ADMIT DATE: 06/18/2019   INDICATIONS: Atrial fibrillation    PROCEDURE:   Informed consent was obtained prior to the procedure. The risks, benefits and alternatives for the procedure were discussed and the patient comprehended these risks. Once an appropriate time out was taken, the patient had the defibrillator pads placed in the anterior and posterior position. The patient then underwent sedation by the anesthesia service. Once an appropriate level of sedation was achieved, the patient received a single biphasic, synchronized 200J shock with prompt conversion to sinus rhythm. No apparent complications. The presence of NSR was confirmed by ICD interrogation with the [employer] device rep present.   VAD interrogated personally. Parameters stable.   Glori Bickers, MD  10:36 AM

## 2019-06-20 ENCOUNTER — Encounter (HOSPITAL_COMMUNITY): Payer: Self-pay | Admitting: Internal Medicine

## 2019-06-22 ENCOUNTER — Ambulatory Visit (HOSPITAL_COMMUNITY): Payer: Self-pay | Admitting: Pharmacist

## 2019-06-22 LAB — POCT INR: INR: 2.2 (ref 2.0–3.0)

## 2019-06-22 NOTE — Progress Notes (Signed)
LVAD INR 

## 2019-06-29 ENCOUNTER — Ambulatory Visit (HOSPITAL_COMMUNITY): Payer: Self-pay | Admitting: Pharmacist

## 2019-06-29 LAB — POCT INR: INR: 2.2 (ref 2.0–3.0)

## 2019-06-29 NOTE — Progress Notes (Signed)
LVAD INR 

## 2019-06-30 ENCOUNTER — Other Ambulatory Visit (HOSPITAL_COMMUNITY): Payer: Self-pay | Admitting: *Deleted

## 2019-06-30 DIAGNOSIS — D649 Anemia, unspecified: Secondary | ICD-10-CM

## 2019-06-30 DIAGNOSIS — Z7901 Long term (current) use of anticoagulants: Secondary | ICD-10-CM

## 2019-06-30 DIAGNOSIS — Z95811 Presence of heart assist device: Secondary | ICD-10-CM

## 2019-07-01 ENCOUNTER — Ambulatory Visit (HOSPITAL_COMMUNITY)
Admission: RE | Admit: 2019-07-01 | Discharge: 2019-07-01 | Disposition: A | Discharge status: Home / Self Care | Payer: Medicare Other | Source: Ambulatory Visit | Attending: Internal Medicine | Admitting: Internal Medicine

## 2019-07-01 ENCOUNTER — Encounter (HOSPITAL_COMMUNITY): Payer: Self-pay | Admitting: *Deleted

## 2019-07-01 ENCOUNTER — Other Ambulatory Visit: Payer: Self-pay

## 2019-07-01 VITALS — BP 112/81 | HR 73 | Temp 98.0°F | Ht 60.0 in

## 2019-07-01 DIAGNOSIS — R42 Dizziness and giddiness: Secondary | ICD-10-CM | POA: Diagnosis not present

## 2019-07-01 DIAGNOSIS — M8580 Other specified disorders of bone density and structure, unspecified site: Secondary | ICD-10-CM | POA: Diagnosis not present

## 2019-07-01 DIAGNOSIS — I428 Other cardiomyopathies: Secondary | ICD-10-CM | POA: Diagnosis not present

## 2019-07-01 DIAGNOSIS — Z79899 Other long term (current) drug therapy: Secondary | ICD-10-CM | POA: Diagnosis not present

## 2019-07-01 DIAGNOSIS — D649 Anemia, unspecified: Secondary | ICD-10-CM | POA: Insufficient documentation

## 2019-07-01 DIAGNOSIS — Z7901 Long term (current) use of anticoagulants: Secondary | ICD-10-CM | POA: Insufficient documentation

## 2019-07-01 DIAGNOSIS — Z8674 Personal history of sudden cardiac arrest: Secondary | ICD-10-CM | POA: Diagnosis not present

## 2019-07-01 DIAGNOSIS — I10 Essential (primary) hypertension: Secondary | ICD-10-CM

## 2019-07-01 DIAGNOSIS — I5022 Chronic systolic (congestive) heart failure: Secondary | ICD-10-CM | POA: Insufficient documentation

## 2019-07-01 DIAGNOSIS — Z95810 Presence of automatic (implantable) cardiac defibrillator: Secondary | ICD-10-CM | POA: Insufficient documentation

## 2019-07-01 DIAGNOSIS — I110 Hypertensive heart disease with heart failure: Secondary | ICD-10-CM | POA: Diagnosis not present

## 2019-07-01 DIAGNOSIS — Z95811 Presence of heart assist device: Secondary | ICD-10-CM | POA: Insufficient documentation

## 2019-07-01 DIAGNOSIS — I480 Paroxysmal atrial fibrillation: Secondary | ICD-10-CM | POA: Insufficient documentation

## 2019-07-01 LAB — BASIC METABOLIC PANEL
Anion gap: 9 (ref 5–15)
BUN: 12 mg/dL (ref 8–23)
CO2: 22 mmol/L (ref 22–32)
Calcium: 9.7 mg/dL (ref 8.9–10.3)
Chloride: 108 mmol/L (ref 98–111)
Creatinine, Ser: 1.03 mg/dL — ABNORMAL HIGH (ref 0.44–1.00)
GFR calc Af Amer: 59 mL/min — ABNORMAL LOW (ref >60)
GFR calc non Af Amer: 51 mL/min — ABNORMAL LOW (ref >60)
Glucose, Bld: 87 mg/dL (ref 70–99)
Potassium: 4.8 mmol/L (ref 3.5–5.1)
Sodium: 139 mmol/L (ref 135–145)

## 2019-07-01 LAB — CBC
HCT: 43.7 % (ref 36.0–46.0)
Hemoglobin: 13.3 g/dL (ref 12.0–15.0)
MCH: 30.2 pg (ref 26.0–34.0)
MCHC: 30.4 g/dL (ref 30.0–36.0)
MCV: 99.3 fL (ref 80.0–100.0)
Platelets: 212 10*3/uL (ref 150–400)
RBC: 4.4 MIL/uL (ref 3.87–5.11)
RDW: 15.3 % (ref 11.5–15.5)
WBC: 5 10*3/uL (ref 4.0–10.5)
nRBC: 0 % (ref 0.0–0.2)

## 2019-07-01 LAB — PROTIME-INR
INR: 1.7 — ABNORMAL HIGH (ref 0.8–1.2)
Prothrombin Time: 19.8 seconds — ABNORMAL HIGH (ref 11.4–15.2)

## 2019-07-01 LAB — LACTATE DEHYDROGENASE: LDH: 257 U/L — ABNORMAL HIGH (ref 98–192)

## 2019-07-01 MED ORDER — SODIUM CHLORIDE 0.9 % IV SOLN
510.0000 mg | Freq: Once | INTRAVENOUS | Status: AC
  Administered 2019-07-01: 510 mg via INTRAVENOUS
  Filled 2019-07-01: qty 17

## 2019-07-01 NOTE — Addendum Note (Signed)
Encounter addended by: Jolaine Artist, MD on: 07/01/2019 10:31 PM  Actions taken: Charge Capture section accepted

## 2019-07-01 NOTE — Discharge Instructions (Signed)

## 2019-07-01 NOTE — Addendum Note (Signed)
Encounter addended by: Jolaine Artist, MD on: 07/01/2019 10:18 PM  Actions taken: Clinical Note Signed, Level of Service modified, Visit diagnoses modified, Charge Capture section accepted

## 2019-07-01 NOTE — Progress Notes (Signed)
LVAD Clinic Note  Patient ID: Brianna Hanson, female   DOB: Sep 29, 1934, 84 y.o.   MRN: [id number] Primary Heart Failure: Dr Haroldine Laws Cardiac Surgeon: Dr Prescott Gum  HPI: Brianna Hanson is a 84 y.o. 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 [name] (follwoed q 6 months). Underwent implantation of the HeartMate II LVAD on 04/06/13 for DT. She is not on aspirin due to dizziness. S/P lap cholecystitis 10/2015.   GI Issues 09/2016 LGIB. Angio- no source of bleeding. LVAD speed turned down to 8400  LVAD Speed Change 12/2016 Ramp ECHO -->Speed increased from 8400>8800  Significant Events on VAD Support:  09/21/16 GIB, Ramp echo speed decreased to 8400  01/08/17 - LOW FLOW alarms; speed increased to 8800 RPM 05/20/17>speed decreased to 8600 06/08/19 - DC-CV of AF    Here for routine f/u with [name]:  Recently found to be in recurrent AF on device interrogation and amio increased to 200 bid. Underwent successful DC-CV on 06/18/19. Now feels much better. Able to do all ADLs without difficulty Denies orthopnea or PND. No fevers, chills or problems with driveline. No bleeding, melena or neuro symptoms. No VAD alarms. Taking all meds as prescribed.   VAD Indication: Destination Therapy - age excluding and patient choice    VAD interrogation & Equipment Management: Speed: 8600 Flow: 4.1 Power: 4.3 w PI: 6.1  Fixed speed 8600 Low speed limit: 8000  Exit Site Care: Drive line is being maintained [employer]..    Device:St Jude ICD Therapies: on at 200 bpm; monitor on 110 DDD 60 Last check: 11/16/17    Past Medical History:  Diagnosis Date  . AICD (automatic cardioverter/defibrillator) present   . Anemia   . Atrial fibrillation or flutter   . Cardiac arrest - ventricular fibrillation 12/10   with successful resucitation, S/p ICD  . CHF (congestive heart failure) ([place])   . Diverticula  of colon 2011  . HTN (hypertension)    moderate  . ICD (implantable cardiac defibrillator) in place    she has received appropriate therapy for VF  . Internal and external hemorrhoids without complication [id number]  . Nonischemic cardiomyopathy ([place])    follow[facility] Dr April Holding at [facility]  . Osteopenia   . Plasma cell disorder 03/20/2012  . Plasma cell disorder 03/20/2012  . Sessile colonic polyp 2011   Dr Benson [place]    Current Outpatient Medications  Medication Sig Dispense Refill  . amiodarone (PACERONE) 200 MG tablet Take 1 tablet (200 mg total) by mouth daily. 90 tablet 3  . amLODipine (NORVASC) 10 MG tablet TAKE 1 TABLET BY MOUTH EVERY DAY AT HOUR OF SLEEP 90 tablet 3  . carvedilol (COREG) 6.25 MG tablet TAKE 1 TABLET BY MOUTH 2 TIMES DAILY WITH A MEAL (Patient taking differently: Take 6.25 mg by mouth 2 (two) times daily with a meal. ) 180 tablet 3  . Cholecalciferol (VITAMIN D3) 25 MCG (1000 UT) CAPS Take 1,000 Units by mouth daily.     [name] docusate sodium (COLACE) 100 MG capsule Take 100 mg by mouth daily.    . ferrous sulfate 325 (65 FE) MG tablet Take 1 tablet (325 mg total) by mouth 2 (two) times daily with a meal. 60 tablet 6  . furosemide (LASIX) 20 MG tablet Take 1 tablet (20 mg total) by mouth daily as needed (Only when recommended by CHF clinic.). 30 tablet  6  . gabapentin (NEURONTIN) 100 MG capsule TAKE 1 CAPSULE BY MOUTH EVERYDAY AT BEDTIME 90 capsule 3  . hydrALAZINE (APRESOLINE) 100 MG tablet TAKE 1 TABLET BY MOUTH 3 TIMES A DAY 270 tablet 9  . KLOR-CON M20 20 MEQ tablet TAKE 1 TABLET BY MOUTH TWICE A DAY 180 tablet 3  . losartan (COZAAR) 100 MG tablet Take 1 tablet (100 mg total) by mouth daily. 90 tablet 3  . pantoprazole (PROTONIX) 40 MG tablet Take 1 tablet (40 mg total) by mouth daily. 90 tablet 3  . traMADol (ULTRAM) 50 MG tablet Take 2 tablets (100 mg total) by mouth every 12 (twelve) hours as needed for moderate pain. 60 tablet 3  . warfarin (COUMADIN) 2.5 MG tablet TAKE 1  TABLET BY MOUTH DAILY AT 6 PM FOR 1 DOSE. TAKE 2 TABS DAILY EXCEPT 1 & 1/2 TABS ON TUESDAYS (Patient taking differently: Take 2.5-5 mg by mouth See admin instructions. Take 2.5 mg (1 tab) every Tues/Thurs/Sat and 5 mg (2 tabs) all other days or as directed by HF clinic) 180 tablet 3   No current facility-administered medications for this encounter.    No Known Allergies  Review of systems complete and found to be negative unless listed in HPI.    Vital Signs:  Temp:   MAPs 80  General:  NAD.  HEENT: normal  Neck: supple. JVP not elevated.  Carotids 2+ bilat; no bruits. No lymphadenopathy or thryomegaly appreciated. Cor: LVAD hum.  Lungs: Clear. Abdomen: soft, nontender, non-distended. No hepatosplenomegaly. No bruits or masses. Good bowel sounds. Driveline site clean. Anchor in place.  Extremities: no cyanosis, clubbing, rash. Warm no edema  Neuro: alert & oriented x 3. No focal deficits. Moves all 4 without problem    ASSESSMENT AND PLAN:  1) Chronic systolic HF: NICM, s/p ICD and LVAD for DT(04/2013).  - now 6+ years out.  - Feels much better after DC-CV. Now back to baseline NYHA I-II - Volume status looks good  - lasix only prn  2) LVAD placed for DT 04/2013:  - Failed Entresto due to diuresis and incessant low flow alarms.  - Continue losartan 100 mg daily. MAP good today - No ASA with GI bleed and intolerance.  - Continue coumadin. No bleeding.  - LDH 257 - INR 1.7 Discussed dosing with PharmD personally. - VAD interrogated personally. Parameters stable. No low flows since DC-CV. About 8 PI events per day.  3) PAF:  - Did not tolerate AF well at all.  - S/p DC-CV 06/18/19 - She has stopped amio. Will restart at 200 daily - Continue warfarin - ICD interrogation done personally today shows NSR. 3% PVCs.  4) HTN:  - Blood pressure well controlled. Continue current regimen. 5) Chronic Anticoagulation:  - As above.   - Goal INR 1.8-2.3.  - INR 1.7 Discussed dosing  with PharmD personally. - Hgb stable 13.3  Total time spent 35 minutes. Over half that time spent discussing above.   Glori Bickers, MD 10:10 PM

## 2019-07-02 ENCOUNTER — Other Ambulatory Visit (HOSPITAL_COMMUNITY): Payer: Self-pay | Admitting: *Deleted

## 2019-07-02 ENCOUNTER — Encounter (HOSPITAL_COMMUNITY): Payer: Self-pay

## 2019-07-02 DIAGNOSIS — D649 Anemia, unspecified: Secondary | ICD-10-CM

## 2019-07-02 DIAGNOSIS — Z95811 Presence of heart assist device: Secondary | ICD-10-CM

## 2019-07-02 NOTE — Addendum Note (Signed)
Encounter addended by: Lezlie Octave, RN on: 07/02/2019 11:56 AM  Actions taken: Pend clinical note

## 2019-07-02 NOTE — Progress Notes (Signed)
Patient presents for 2 week follow up in [facility] today with caregiver, Brianna Hanson.  Reports no problems with VAD equipment or concerns with drive line.  This is a f/u visit from cardioversion performed by [name] on 06/18/19 for recurrent AF. Pt says she is feeling "much better" and reports her energy level is back to normal. She and Brianna Hanson plan on vacation as planned in the near future.   Pt says she stopped taking Amiodarone day of cardioversion, per [name] she is to resume at 200 mg daily. She and Brianna Hanson verbalize understanding of same.   [employer] rep here and performed full interrogation of pt's ICD. He reports 11 episodes of non-sustained atrial tach since CV; atrial rates 130 - 160/vent rates 75, longest episode lasting 12 sec.   Vital Signs:  Temp: 98.0 Doppler Pressure:  104 Automatc BP:  112/81 (93) HR: 73  SPO2:  96% on RA  Weight:  lb w/o eqt Last weight: 182.6 lbs  VAD Indication: Destination Therapy - age excluding and patient choice    VAD interrogation & Equipment Management: Speed: 8600 Flow: 4.0 Power: 4.6w    PI: 6.5  Alarms: none Events: rare over last few days; 30 PI events on 10/23; 74 PI events on 10/22  Fixed speed 8600 Low speed limit: 8000  Primary Controller: Replace back up battery in 15 months. Back up controller: Replace back up battery in 10 months.  Exit Site Care: Drive line is being maintained weekly by [employer]. Drive line exit site well healed and incorporated. The velour is fully implanted at exit site. Dressing dry and intact. No erythema or drainage. Stabilization device present and accurately applied. Pt denies fever or chills. Gave pt 8 dressing kits for home use.   Significant Events on VAD Support:  09/21/16 GIB, Ramp echo speed decreased to 8400  01/08/17 - LOW FLOW alarms; speed increased to 8800 RPM 05/20/17> speed decreased to 8600  Device:St Jude ICD Therapies: on at 200 bpm; monitor on 110 DDD 60 Last check:  today: 07/01/19  BP & Labs: Doppler BP 104 -  is reflecting modified systolic  Hgb 99991111 - No S/S of bleeding; denies melena/BRBPR or nosebleeds during the last two months.   LDH 257 - Within the range of  150 - 300. Denies any dark urine.  Patient Instructions: 1. Re-start Amiodarone and take 200 mg daily. 2. Return to [facility] in one month. 3. Will schedule 2nd dose of iron for same day as above.   Zada Girt RN [place] Coordinator  Office: [phone]  24/7 Pager: [phone]

## 2019-07-02 NOTE — Patient Instructions (Addendum)
1. Re-start Amiodarone and take 200 mg daily. 2. Return to [facility] in one month. 3. Will schedule 2nd dose of iron for same day as above.

## 2019-07-02 NOTE — Addendum Note (Signed)
Encounter addended by: Lezlie Octave, RN on: 07/02/2019 12:33 PM  Actions taken: Vitals modified, Medication taking status modified, Home Medications modified, Order Reconciliation Section accessed, Medication List reviewed, Clinical Note Signed

## 2019-07-05 ENCOUNTER — Other Ambulatory Visit (HOSPITAL_COMMUNITY): Payer: Self-pay

## 2019-07-05 MED ORDER — PANTOPRAZOLE SODIUM 40 MG PO TBEC: 40.0000 mg | DELAYED_RELEASE_TABLET | Freq: Every day | ORAL | 3 refills | Status: DC

## 2019-07-06 ENCOUNTER — Ambulatory Visit (HOSPITAL_COMMUNITY): Payer: Self-pay | Admitting: Pharmacist

## 2019-07-06 LAB — POCT INR: INR: 2 (ref 2.0–3.0)

## 2019-07-06 NOTE — Progress Notes (Signed)
LVAD INR 

## 2019-07-08 ENCOUNTER — Encounter (HOSPITAL_COMMUNITY): Payer: Medicare Other

## 2019-07-13 ENCOUNTER — Ambulatory Visit (HOSPITAL_COMMUNITY): Payer: Self-pay | Admitting: Pharmacist

## 2019-07-13 DIAGNOSIS — Z7901 Long term (current) use of anticoagulants: Secondary | ICD-10-CM | POA: Diagnosis not present

## 2019-07-13 LAB — POCT INR: INR: 1.9 — AB (ref 2.0–3.0)

## 2019-07-13 NOTE — Progress Notes (Signed)
LVAD INR 

## 2019-07-20 ENCOUNTER — Ambulatory Visit (HOSPITAL_COMMUNITY): Payer: Self-pay | Admitting: Pharmacist

## 2019-07-20 ENCOUNTER — Encounter (HOSPITAL_COMMUNITY): Payer: Medicare Other

## 2019-07-20 LAB — POCT INR: INR: 2.6 (ref 2.0–3.0)

## 2019-07-20 NOTE — Progress Notes (Signed)
LVAD INR 

## 2019-07-22 ENCOUNTER — Other Ambulatory Visit: Payer: Self-pay

## 2019-07-26 ENCOUNTER — Telehealth (HOSPITAL_COMMUNITY): Payer: Self-pay | Admitting: Unknown Physician Specialty

## 2019-07-26 MED ORDER — CYCLOBENZAPRINE HCL 5 MG PO TABS: 5.0000 mg | ORAL_TABLET | Freq: Two times a day (BID) | ORAL | 0 refills | Status: DC | PRN

## 2019-07-26 NOTE — Telephone Encounter (Signed)
Pt called the office stating that she hurt her back doing her "day to day activities." pt states she was straightening up her room and cleaning house Saturday. Pt states that she was in bed yesterday with back pain. Pt denies any bowel or bladder incontinence, fever, urinary frequency, or falls. Pt has taken Tramadol with no relief. Script sent for Flexeril 5 mg bid prn per [name]. Pt informed she can also take Tramadol 50 mg q 8 hrs prn. Pt/caregiver informed to call if back pain does not improve.    Tanda Rockers RN, BSN VAD Coordinator 24/7 Pager [phone]

## 2019-07-27 ENCOUNTER — Ambulatory Visit (HOSPITAL_COMMUNITY): Payer: Self-pay | Admitting: Pharmacist

## 2019-07-27 LAB — POCT INR: INR: 2.3 (ref 2.0–3.0)

## 2019-07-27 NOTE — Progress Notes (Signed)
LVAD INR 

## 2019-07-28 ENCOUNTER — Other Ambulatory Visit (HOSPITAL_COMMUNITY): Payer: Self-pay | Admitting: *Deleted

## 2019-08-03 ENCOUNTER — Ambulatory Visit (HOSPITAL_COMMUNITY): Payer: Self-pay | Admitting: Pharmacist

## 2019-08-03 ENCOUNTER — Ambulatory Visit (INDEPENDENT_AMBULATORY_CARE_PROVIDER_SITE_OTHER): Payer: Medicare Other | Admitting: *Deleted

## 2019-08-03 DIAGNOSIS — I5022 Chronic systolic (congestive) heart failure: Secondary | ICD-10-CM

## 2019-08-03 LAB — CUP PACEART REMOTE DEVICE CHECK
Battery Remaining Longevity: 4 mo
Battery Remaining Percentage: 4 %
Battery Voltage: 2.62 V
Brady Statistic AP VP Percent: 1 %
Brady Statistic AP VS Percent: 7.2 %
Brady Statistic AS VP Percent: 1 %
Brady Statistic AS VS Percent: 91 %
Brady Statistic RA Percent Paced: 5 %
Brady Statistic RV Percent Paced: 1 %
Date Time Interrogation Session: 20201201050606
HighPow Impedance: 49 Ohm
Implantable Lead Implant Date: 20110104
Implantable Lead Implant Date: 20110104
Implantable Lead Location: 753859
Implantable Lead Location: 753860
Implantable Lead Model: 7121
Implantable Pulse Generator Implant Date: 20110104
Lead Channel Impedance Value: 310 Ohm
Lead Channel Impedance Value: 330 Ohm
Lead Channel Pacing Threshold Amplitude: 0.75 V
Lead Channel Pacing Threshold Amplitude: 0.75 V
Lead Channel Pacing Threshold Pulse Width: 0.5 ms
Lead Channel Pacing Threshold Pulse Width: 0.5 ms
Lead Channel Sensing Intrinsic Amplitude: 0.8 mV
Lead Channel Sensing Intrinsic Amplitude: 9.3 mV
Lead Channel Setting Pacing Amplitude: 2.5 V
Lead Channel Setting Pacing Amplitude: 2.5 V
Lead Channel Setting Pacing Pulse Width: 0.5 ms
Lead Channel Setting Sensing Sensitivity: 0.5 mV
Pulse Gen Serial Number: 754815

## 2019-08-03 LAB — POCT INR: INR: 2.4 (ref 2.0–3.0)

## 2019-08-03 NOTE — Progress Notes (Signed)
LVAD INR 

## 2019-08-04 ENCOUNTER — Other Ambulatory Visit (HOSPITAL_COMMUNITY): Payer: Self-pay | Admitting: *Deleted

## 2019-08-04 ENCOUNTER — Ambulatory Visit (INDEPENDENT_AMBULATORY_CARE_PROVIDER_SITE_OTHER): Payer: Medicare Other | Admitting: Internal Medicine

## 2019-08-04 ENCOUNTER — Ambulatory Visit (INDEPENDENT_AMBULATORY_CARE_PROVIDER_SITE_OTHER)
Admission: RE | Admit: 2019-08-04 | Discharge: 2019-08-04 | Disposition: A | Discharge status: Home / Self Care | Payer: Medicare Other | Source: Ambulatory Visit | Attending: Internal Medicine | Admitting: Internal Medicine

## 2019-08-04 ENCOUNTER — Encounter: Payer: Self-pay | Admitting: Internal Medicine

## 2019-08-04 ENCOUNTER — Other Ambulatory Visit: Payer: Self-pay

## 2019-08-04 VITALS — BP 90/70 | HR 73 | Temp 97.8°F | Resp 16 | Ht 60.0 in | Wt 178.5 lb

## 2019-08-04 DIAGNOSIS — S32010A Wedge compression fracture of first lumbar vertebra, initial encounter for closed fracture: Secondary | ICD-10-CM

## 2019-08-04 DIAGNOSIS — E785 Hyperlipidemia, unspecified: Secondary | ICD-10-CM

## 2019-08-04 DIAGNOSIS — M5442 Lumbago with sciatica, left side: Secondary | ICD-10-CM

## 2019-08-04 DIAGNOSIS — Z95811 Presence of heart assist device: Secondary | ICD-10-CM

## 2019-08-04 DIAGNOSIS — I5022 Chronic systolic (congestive) heart failure: Secondary | ICD-10-CM

## 2019-08-04 MED ORDER — OXYCODONE HCL 5 MG PO TABS: 5.0000 mg | ORAL_TABLET | ORAL | 0 refills | Status: DC | PRN

## 2019-08-04 NOTE — Progress Notes (Signed)
Subjective:  Patient ID: Brianna Hanson, female    DOB: 14-Apr-1935  Age: 84 y.o. MRN: [id number]  CC: Back Pain   This visit occurred during the SARS-CoV-2 public health emergency.  Safety protocols were in place, including screening questions prior to the visit, additional usage of staff PPE, and extensive cleaning of exam room while observing appropriate contact time as indicated for disinfecting solutions.    HPI Brianna Hanson presents for concerns about low back pain.  She has had a 10-day history of a throbbing/aching sensation in her lower back with no history of trauma or injury.  The pain initially started on the right side and has now is progressed to the left side.  She said the pain occasionally radiates into her left hip but she denies lower extremity paresthesias.  She has not gotten much symptom relief with Flexeril or tramadol.  The pain worsens with movement and activity.  She is confined to a wheelchair today.  Outpatient Medications Prior to Visit  Medication Sig Dispense Refill  . amiodarone (PACERONE) 200 MG tablet Take 1 tablet (200 mg total) by mouth daily. 90 tablet 3  . amLODipine (NORVASC) 10 MG tablet TAKE 1 TABLET BY MOUTH EVERY DAY AT HOUR OF SLEEP 90 tablet 3  . carvedilol (COREG) 6.25 MG tablet TAKE 1 TABLET BY MOUTH 2 TIMES DAILY WITH A MEAL (Patient taking differently: Take 6.25 mg by mouth 2 (two) times daily with a meal. ) 180 tablet 3  . Cholecalciferol (VITAMIN D3) 25 MCG (1000 UT) CAPS Take 1,000 Units by mouth daily.     . cyclobenzaprine (FLEXERIL) 5 MG tablet Take 1 tablet (5 mg total) by mouth 2 (two) times daily as needed for muscle spasms. 60 tablet 0  . docusate sodium (COLACE) 100 MG capsule Take 100 mg by mouth daily.    . ferrous sulfate 325 (65 FE) MG tablet Take 1 tablet (325 mg total) by mouth 2 (two) times daily with a meal. 60 tablet 6  . furosemide (LASIX) 20 MG tablet Take 1 tablet (20 mg total) by mouth daily as needed (Only when  recommended by CHF clinic.). 30 tablet 6  . gabapentin (NEURONTIN) 100 MG capsule TAKE 1 CAPSULE BY MOUTH EVERYDAY AT BEDTIME 90 capsule 3  . hydrALAZINE (APRESOLINE) 100 MG tablet TAKE 1 TABLET BY MOUTH 3 TIMES A DAY 270 tablet 9  . KLOR-CON M20 20 MEQ tablet TAKE 1 TABLET BY MOUTH TWICE A DAY 180 tablet 3  . losartan (COZAAR) 100 MG tablet Take 1 tablet (100 mg total) by mouth daily. 90 tablet 3  . traMADol (ULTRAM) 50 MG tablet Take 2 tablets (100 mg total) by mouth every 12 (twelve) hours as needed for moderate pain. 60 tablet 3  . warfarin (COUMADIN) 2.5 MG tablet TAKE 1 TABLET BY MOUTH DAILY AT 6 PM FOR 1 DOSE. TAKE 2 TABS DAILY EXCEPT 1 & 1/2 TABS ON TUESDAYS (Patient taking differently: Take 2.5-5 mg by mouth See admin instructions. Take 2.5 mg (1 tab) every Tues/Thurs/Sat and 5 mg (2 tabs) all other days or as directed by HF clinic) 180 tablet 3  . pantoprazole (PROTONIX) 40 MG tablet Take 1 tablet (40 mg total) by mouth daily. (Patient not taking: Reported on 08/04/2019) 90 tablet 3   No facility-administered medications prior to visit.     ROS Review of Systems  Constitutional: Negative for chills, diaphoresis, fatigue and fever.  Eyes: Negative.   Respiratory: Negative for cough, shortness of  breath and wheezing.   Cardiovascular: Negative for chest pain, palpitations and leg swelling.  Gastrointestinal: Negative for abdominal pain, constipation, diarrhea, nausea and vomiting.  Endocrine: Negative.   Genitourinary: Negative.  Negative for difficulty urinating, dysuria and hematuria.  Musculoskeletal: Positive for back pain. Negative for myalgias and neck pain.  Skin: Negative.  Negative for color change, pallor and rash.  Neurological: Negative.  Negative for dizziness, weakness, numbness and headaches.  Hematological: Negative for adenopathy. Does not bruise/bleed easily.  Psychiatric/Behavioral: Negative.     Objective:  BP 90/70 (BP Location: Left Arm, Patient Position:  Sitting, Cuff Size: Large)   Pulse 73   Temp 97.8 F (36.6 C) (Oral)   Resp 16   Ht 5' (1.524 m)   Wt 178 lb 8 oz (81 kg)   SpO2 95%   BMI 34.86 kg/m   BP Readings from Last 3 Encounters:  08/04/19 90/70  07/02/19 112/81  07/01/19 96/75    Wt Readings from Last 3 Encounters:  08/04/19 178 lb 8 oz (81 kg)  06/15/19 182 lb 9.6 oz (82.8 kg)  04/08/19 178 lb (80.7 kg)    Physical Exam Vitals signs reviewed. Nursing note reviewed: in a wheelchair today.  Constitutional:      Appearance: She is obese. She is not ill-appearing or diaphoretic.  HENT:     Nose: Nose normal.     Mouth/Throat:     Pharynx: Oropharynx is clear.  Eyes:     General: No scleral icterus.    Conjunctiva/sclera: Conjunctivae normal.  Neck:     Musculoskeletal: Neck supple.  Cardiovascular:     Rate and Rhythm: Normal rate and regular rhythm.     Comments: +++ humming Pulmonary:     Breath sounds: No stridor. No wheezing, rhonchi or rales.  Abdominal:     General: Abdomen is protuberant. Bowel sounds are normal. There is no distension.     Palpations: Abdomen is soft. There is no hepatomegaly or splenomegaly.     Tenderness: There is no abdominal tenderness.  Musculoskeletal:     Lumbar back: She exhibits tenderness. She exhibits normal range of motion, no bony tenderness, no swelling, no edema, no deformity, no pain and no spasm.  Lymphadenopathy:     Cervical: No cervical adenopathy.  Neurological:     Mental Status: She is alert.     Cranial Nerves: Cranial nerves are intact.     Sensory: Sensation is intact.     Motor: Weakness present.     Deep Tendon Reflexes: Reflexes normal.     Reflex Scores:      Tricep reflexes are 0 on the right side and 0 on the left side.      Bicep reflexes are 0 on the right side and 0 on the left side.      Brachioradialis reflexes are 0 on the right side and 0 on the left side.      Patellar reflexes are 0 on the right side and 0 on the left side.       Achilles reflexes are 0 on the right side and 0 on the left side.    Comments: +SLR in LLE -SLR in RLE  Mild symmetrical weakness in both lower extremities    Lab Results  Component Value Date   WBC 5.0 07/01/2019   HGB 13.3 07/01/2019   HCT 43.7 07/01/2019   PLT 212 07/01/2019   GLUCOSE 87 07/01/2019   CHOL 160 05/19/2018   TRIG 189 (H) 05/19/2018  HDL 44 05/19/2018   LDLCALC 78 05/19/2018   ALT 23 04/08/2019   AST 27 04/08/2019   NA 139 07/01/2019   K 4.8 07/01/2019   CL 108 07/01/2019   CREATININE 1.03 (H) 07/01/2019   BUN 12 07/01/2019   CO2 22 07/01/2019   TSH 2.88 03/04/2018   INR 2.4 08/03/2019   HGBA1C 5.2 03/04/2018    Dg Lumbar Spine Complete  Result Date: 08/04/2019 CLINICAL DATA:  Left low back pain for 10 days.  No known injury. EXAM: LUMBAR SPINE - COMPLETE 4+ VIEW COMPARISON:  Plain films lumbar spine 08/08/2012. FINDINGS: The patient has an L1 superior endplate compression fracture with vertebral body height loss approximately 50%. The fracture is new since 2013 but age indeterminate. No other fracture is identified. Intervertebral disc space height is maintained. Lower lumbar facet arthropathy is seen. Paraspinous structures demonstrate an intra-aortic balloon pump and atherosclerosis. IMPRESSION: Age indeterminate L1 superior endplate compression fracture. No other acute abnormality. Lower lumbar facet degenerative disease. Atherosclerosis. Electronically Signed   By: Inge Rise M.D.   On: 08/04/2019 14:33    Assessment & Plan:   Jacques was seen today for back pain.  Diagnoses and all orders for this visit:  Hyperlipidemia LDL goal <130 -     Lipid panel; Future  Acute left-sided low back pain with left-sided sciatica- Plain films what appears to be a new vertebral fracture in L1.  Will upgrade her pain relief to oxycodone and if it is feasible will have her undergo a lumbar MRI to see if there is a pathological cause and to assess for neurological  compromise. -     DG Lumbar Spine Complete; Future -     MR Lumbar Spine Wo Contrast; Future  Closed wedge compression fracture of L1 vertebra, initial encounter ([place])- See above -     MR Lumbar Spine Wo Contrast; Future -     oxyCODONE (OXY IR/ROXICODONE) 5 MG immediate release tablet; Take 1 tablet (5 mg total) by mouth every 4 (four) hours as needed for severe pain.   I am having Brianna Hanson start on oxyCODONE. I am also having her maintain her ferrous sulfate, docusate sodium, Vitamin D3, Klor-Con M20, hydrALAZINE, carvedilol, warfarin, gabapentin, amLODipine, traMADol, amiodarone, losartan, furosemide, pantoprazole, and cyclobenzaprine.  Meds ordered this encounter  Medications  . oxyCODONE (OXY IR/ROXICODONE) 5 MG immediate release tablet    Sig: Take 1 tablet (5 mg total) by mouth every 4 (four) hours as needed for severe pain.    Dispense:  75 tablet    Refill:  0     Follow-up: Return in about 3 weeks (around 08/25/2019).  Scarlette Calico, MD

## 2019-08-04 NOTE — Patient Instructions (Signed)
Acute Back Pain, Adult Acute back pain is sudden and usually short-lived. It is often caused by an injury to the muscles and tissues in the back. The injury may result from:  A muscle or ligament getting overstretched or torn (strained). Ligaments are tissues that connect bones to each other. Lifting something improperly can cause a back strain.  Wear and tear (degeneration) of the spinal disks. Spinal disks are circular tissue that provides cushioning between the bones of the spine (vertebrae).  Twisting motions, such as while playing sports or doing yard work.  A hit to the back.  Arthritis. You may have a physical exam, lab tests, and imaging tests to find the cause of your pain. Acute back pain usually goes away with rest and home care. Follow these instructions at home: Managing pain, stiffness, and swelling  Take over-the-counter and prescription medicines only as told by your health care provider.  Your health care provider may recommend applying ice during the first 24-48 hours after your pain starts. To do this: ? Put ice in a plastic bag. ? Place a towel between your skin and the bag. ? Leave the ice on for 20 minutes, 2-3 times a day.  If directed, apply heat to the affected area as often as told by your health care provider. Use the heat source that your health care provider recommends, such as a moist heat pack or a heating pad. ? Place a towel between your skin and the heat source. ? Leave the heat on for 20-30 minutes. ? Remove the heat if your skin turns bright red. This is especially important if you are unable to feel pain, heat, or cold. You have a greater risk of getting burned. Activity   Do not stay in bed. Staying in bed for more than 1-2 days can delay your recovery.  Sit up and stand up straight. Avoid leaning forward when you sit, or hunching over when you stand. ? If you work at a desk, sit close to it so you do not need to lean over. Keep your chin tucked  in. Keep your neck drawn back, and keep your elbows bent at a right angle. Your arms should look like the letter "L." ? Sit high and close to the steering wheel when you drive. Add lower back (lumbar) support to your car seat, if needed.  Take short walks on even surfaces as soon as you are able. Try to increase the length of time you walk each day.  Do not sit, drive, or stand in one place for more than 30 minutes at a time. Sitting or standing for long periods of time can put stress on your back.  Do not drive or use heavy machinery while taking prescription pain medicine.  Use proper lifting techniques. When you bend and lift, use positions that put less stress on your back: ? Bend your knees. ? Keep the load close to your body. ? Avoid twisting.  Exercise regularly as told by your health care provider. Exercising helps your back heal faster and helps prevent back injuries by keeping muscles strong and flexible.  Work with a physical therapist to make a safe exercise program, as recommended by your health care provider. Do any exercises as told by your physical therapist. Lifestyle  Maintain a healthy weight. Extra weight puts stress on your back and makes it difficult to have good posture.  Avoid activities or situations that make you feel anxious or stressed. Stress and anxiety increase muscle   tension and can make back pain worse. Learn ways to manage anxiety and stress, such as through exercise. General instructions  Sleep on a firm mattress in a comfortable position. Try lying on your side with your knees slightly bent. If you lie on your back, put a pillow under your knees.  Follow your treatment plan as told by your health care provider. This may include: ? Cognitive or behavioral therapy. ? Acupuncture or massage therapy. ? Meditation or yoga. Contact a health care provider if:  You have pain that is not relieved with rest or medicine.  You have increasing pain going down  into your legs or buttocks.  Your pain does not improve after 2 weeks.  You have pain at night.  You lose weight without trying.  You have a fever or chills. Get help right away if:  You develop new bowel or bladder control problems.  You have unusual weakness or numbness in your arms or legs.  You develop nausea or vomiting.  You develop abdominal pain.  You feel faint. Summary  Acute back pain is sudden and usually short-lived.  Use proper lifting techniques. When you bend and lift, use positions that put less stress on your back.  Take over-the-counter and prescription medicines and apply heat or ice as directed by your health care provider. This information is not intended to replace advice given to you by your health care provider. Make sure you discuss any questions you have with your health care provider. Document Released: 08/19/2005 Document Revised: 12/08/2018 Document Reviewed: 04/02/2017 Elsevier Patient Education  2020 [employer]  

## 2019-08-05 ENCOUNTER — Encounter (HOSPITAL_COMMUNITY): Payer: Medicare Other

## 2019-08-05 ENCOUNTER — Inpatient Hospital Stay (HOSPITAL_COMMUNITY)
Admission: RE | Admit: 2019-08-05 | Discharge: 2019-08-05 | Disposition: A | Discharge status: Home / Self Care | Payer: Medicare Other | Source: Ambulatory Visit | Attending: Internal Medicine | Admitting: Internal Medicine

## 2019-08-05 NOTE — Addendum Note (Signed)
Addended by: Janith Lima on: 08/05/2019 09:11 AM   Modules accepted: Orders

## 2019-08-10 ENCOUNTER — Ambulatory Visit (HOSPITAL_COMMUNITY): Payer: Self-pay | Admitting: Pharmacist

## 2019-08-10 DIAGNOSIS — Z7901 Long term (current) use of anticoagulants: Secondary | ICD-10-CM | POA: Diagnosis not present

## 2019-08-10 LAB — POCT INR: INR: 3.5 — AB (ref 2.0–3.0)

## 2019-08-10 NOTE — Progress Notes (Signed)
LVAD INR 

## 2019-08-12 ENCOUNTER — Other Ambulatory Visit (HOSPITAL_COMMUNITY): Payer: Self-pay | Admitting: *Deleted

## 2019-08-12 ENCOUNTER — Telehealth (HOSPITAL_COMMUNITY): Payer: Self-pay | Admitting: *Deleted

## 2019-08-12 ENCOUNTER — Encounter (HOSPITAL_COMMUNITY): Payer: Medicare Other

## 2019-08-12 DIAGNOSIS — M546 Pain in thoracic spine: Secondary | ICD-10-CM

## 2019-08-12 DIAGNOSIS — M549 Dorsalgia, unspecified: Secondary | ICD-10-CM

## 2019-08-12 DIAGNOSIS — Z95811 Presence of heart assist device: Secondary | ICD-10-CM

## 2019-08-12 NOTE — Telephone Encounter (Signed)
Called to check on patient per Dr. Dannielle Burn. Left message informing her we have referred her to see Orhopedic surgery: Dr. Zollie Beckers per [name]. Informed Jemya that his office will be calling to set up appointment. Asked Maryluz to call our office and confirm appt and update [place] on how she is doing?  Zada Girt RN, [place] Coordinator [phone]

## 2019-08-13 ENCOUNTER — Ambulatory Visit (HOSPITAL_COMMUNITY)
Admission: RE | Admit: 2019-08-13 | Discharge: 2019-08-13 | Disposition: A | Discharge status: Home / Self Care | Payer: Medicare Other | Source: Ambulatory Visit | Attending: Cardiology | Admitting: Cardiology

## 2019-08-13 ENCOUNTER — Other Ambulatory Visit (HOSPITAL_COMMUNITY): Payer: Self-pay | Admitting: Internal Medicine

## 2019-08-13 ENCOUNTER — Encounter (HOSPITAL_COMMUNITY): Payer: Self-pay

## 2019-08-13 ENCOUNTER — Ambulatory Visit (INDEPENDENT_AMBULATORY_CARE_PROVIDER_SITE_OTHER): Payer: Medicare Other | Admitting: Orthopaedic Surgery

## 2019-08-13 ENCOUNTER — Ambulatory Visit
Admission: RE | Admit: 2019-08-13 | Discharge: 2019-08-13 | Disposition: A | Discharge status: Home / Self Care | Payer: Medicare Other | Source: Ambulatory Visit | Attending: Internal Medicine | Admitting: Internal Medicine

## 2019-08-13 ENCOUNTER — Other Ambulatory Visit (HOSPITAL_COMMUNITY): Payer: Self-pay | Admitting: *Deleted

## 2019-08-13 ENCOUNTER — Encounter: Payer: Self-pay | Admitting: Orthopaedic Surgery

## 2019-08-13 ENCOUNTER — Other Ambulatory Visit: Payer: Self-pay

## 2019-08-13 VITALS — BP 80/66 | HR 71 | Temp 97.5°F

## 2019-08-13 DIAGNOSIS — Z7901 Long term (current) use of anticoagulants: Secondary | ICD-10-CM

## 2019-08-13 DIAGNOSIS — S32000A Wedge compression fracture of unspecified lumbar vertebra, initial encounter for closed fracture: Secondary | ICD-10-CM | POA: Insufficient documentation

## 2019-08-13 DIAGNOSIS — M549 Dorsalgia, unspecified: Secondary | ICD-10-CM | POA: Diagnosis not present

## 2019-08-13 DIAGNOSIS — Z79899 Other long term (current) drug therapy: Secondary | ICD-10-CM | POA: Diagnosis not present

## 2019-08-13 DIAGNOSIS — Z95811 Presence of heart assist device: Secondary | ICD-10-CM | POA: Diagnosis not present

## 2019-08-13 DIAGNOSIS — D649 Anemia, unspecified: Secondary | ICD-10-CM | POA: Insufficient documentation

## 2019-08-13 DIAGNOSIS — I480 Paroxysmal atrial fibrillation: Secondary | ICD-10-CM

## 2019-08-13 DIAGNOSIS — Z8674 Personal history of sudden cardiac arrest: Secondary | ICD-10-CM | POA: Diagnosis not present

## 2019-08-13 DIAGNOSIS — I4892 Unspecified atrial flutter: Secondary | ICD-10-CM | POA: Insufficient documentation

## 2019-08-13 DIAGNOSIS — M545 Low back pain: Secondary | ICD-10-CM | POA: Diagnosis not present

## 2019-08-13 DIAGNOSIS — S32010D Wedge compression fracture of first lumbar vertebra, subsequent encounter for fracture with routine healing: Secondary | ICD-10-CM | POA: Diagnosis not present

## 2019-08-13 DIAGNOSIS — I428 Other cardiomyopathies: Secondary | ICD-10-CM | POA: Diagnosis not present

## 2019-08-13 DIAGNOSIS — I5022 Chronic systolic (congestive) heart failure: Secondary | ICD-10-CM | POA: Diagnosis not present

## 2019-08-13 DIAGNOSIS — I110 Hypertensive heart disease with heart failure: Secondary | ICD-10-CM | POA: Insufficient documentation

## 2019-08-13 DIAGNOSIS — Z95810 Presence of automatic (implantable) cardiac defibrillator: Secondary | ICD-10-CM | POA: Insufficient documentation

## 2019-08-13 DIAGNOSIS — I10 Essential (primary) hypertension: Secondary | ICD-10-CM

## 2019-08-13 DIAGNOSIS — S32010A Wedge compression fracture of first lumbar vertebra, initial encounter for closed fracture: Secondary | ICD-10-CM

## 2019-08-13 DIAGNOSIS — M5442 Lumbago with sciatica, left side: Secondary | ICD-10-CM

## 2019-08-13 DIAGNOSIS — R110 Nausea: Secondary | ICD-10-CM

## 2019-08-13 LAB — BASIC METABOLIC PANEL
Anion gap: 10 (ref 5–15)
BUN: 15 mg/dL (ref 8–23)
CO2: 20 mmol/L — ABNORMAL LOW (ref 22–32)
Calcium: 9.6 mg/dL (ref 8.9–10.3)
Chloride: 106 mmol/L (ref 98–111)
Creatinine, Ser: 1.58 mg/dL — ABNORMAL HIGH (ref 0.44–1.00)
GFR calc Af Amer: 35 mL/min — ABNORMAL LOW (ref >60)
GFR calc non Af Amer: 30 mL/min — ABNORMAL LOW (ref >60)
Glucose, Bld: 102 mg/dL — ABNORMAL HIGH (ref 70–99)
Potassium: 5.7 mmol/L — ABNORMAL HIGH (ref 3.5–5.1)
Sodium: 136 mmol/L (ref 135–145)

## 2019-08-13 LAB — MAGNESIUM: Magnesium: 2.6 mg/dL — ABNORMAL HIGH (ref 1.7–2.4)

## 2019-08-13 LAB — PROTIME-INR
INR: 3.1 — ABNORMAL HIGH (ref 0.8–1.2)
Prothrombin Time: 32.2 seconds — ABNORMAL HIGH (ref 11.4–15.2)

## 2019-08-13 LAB — CBC
HCT: 40.3 % (ref 36.0–46.0)
Hemoglobin: 12.9 g/dL (ref 12.0–15.0)
MCH: 30.6 pg (ref 26.0–34.0)
MCHC: 32 g/dL (ref 30.0–36.0)
MCV: 95.7 fL (ref 80.0–100.0)
Platelets: 280 10*3/uL (ref 150–400)
RBC: 4.21 MIL/uL (ref 3.87–5.11)
RDW: 15.4 % (ref 11.5–15.5)
WBC: 5.9 10*3/uL (ref 4.0–10.5)
nRBC: 0.3 % — ABNORMAL HIGH (ref 0.0–0.2)

## 2019-08-13 LAB — LACTATE DEHYDROGENASE: LDH: 557 U/L — ABNORMAL HIGH (ref 98–192)

## 2019-08-13 MED ORDER — ONDANSETRON HCL 4 MG PO TABS: 4.0000 mg | ORAL_TABLET | Freq: Three times a day (TID) | ORAL | 3 refills | Status: DC | PRN

## 2019-08-13 NOTE — Progress Notes (Signed)
Patient presents for sick visit in [facility] today with caregiver, [name].  Reports no problems with VAD equipment or concerns with drive line.   She presents to clinic today in a wheelchair. Any movement causes her severe pain in her back. Pt called 11/23 c/o back pain during day to day activities. Prescriptions sent in for Flexeril and Tramadol. Flexeril gives her no relief, and the Tramadol makes her sleepy. She was seen by her PCP Dr Ronnald Ramp 12/2 for worsening back pain. Spinal xray completed: showed L1 compression fracture. He prescribed her Oxy-IR 5mg . She was unable to tolerate Oxy-IR due to hallucinations. Back pain has continued to progress. Pain 10/10 today. She has been unable to get out of bed much over the last week due to pain. She states her legs feel weak. She has an appointment with Dr Lorin Mercy December 22nd. Dr Haroldine Laws called ortho office and was able to get appointment moved to today at 1pm. She has a CT scan scheduled for later this morning. Will send for CT and then ortho appointment.   Per [name] over the last few days pt's flow and PI have been low. They have not heard any low flow alarms but have seen her flow as low as 2.4. She reports that she has been drinking between 2-3 liters a day. BP lower than her normal. Vitals documented below. Will discontinue Amlodipine today per Dr Haroldine Laws. Reports shortness of breath with any activity. Thinks this may be related to pain.   She has not taken any pain medicine today. Over the last few days she has had 2 episodes of nausea and vomiting. Prescription sent in for PRN Zofran per Dr Haroldine Laws. Reports 1 bloody bowel movement last week with clots, but none since then. Hgb today 12.9.   [employer] rep here and performed full interrogation of pt's ICD. She is in sinus rhythm today. Rep reports 1 episode of non-sustained tachycardia on 08/02/19. None since then.  Vital Signs:  Temp: 97.5 Doppler Pressure: 76 Automatc BP: 82/67 (72) SPO2:  97% on  RA  Weight: UTO lb w/o eqt Last weight: 182.6 lbs  VAD Indication: Destination Therapy - age excluding and patient choice    VAD interrogation & Equipment Management: Speed: 8600 Flow: 3.3  Power: 4.0w    PI: 2.4  Alarms: 12/10: 16 low flows, 12/9: 6 low flows, 12/8: 1 low flow, 12/5: 13 low flows Events: 120 PI events 12/10; 10-20 other days  Fixed speed 8600 Low speed limit: 8000  Primary Controller: Replace back up battery in 13 months. Back up controller: Replace back up battery in 10 months.  Exit Site Care: Drive line is being maintained weekly by [employer]. Drive line exit site well healed and incorporated. The velour is fully implanted at exit site. Dressing dry and intact. No erythema or drainage. Stabilization device present and accurately applied. Pt denies fever or chills. Pt has adequate dressing supplies at home.   Significant Events on VAD Support:  09/21/16 GIB, Ramp echo speed decreased to 8400  01/08/17 - LOW FLOW alarms; speed increased to 8800 RPM 05/20/17> speed decreased to 8600  Device:[employer] Jude ICD Therapies: on at 200 bpm; monitor on 110 DDD 60 Last check: 08/03/19  BP & Labs: Doppler BP 72 -  is reflecting MAP  Hgb 12.9 - No S/S of bleeding; denies melena/BRBPR or nosebleeds during the last two months.   LDH 557 (tube possibly hemolyzed per lab report)  - Within the range of  150 -  300. Denies any dark urine.  Patient Instructions: 1. Stop Amlodipine  2. Prescription sent in for Zofran.  You may take every 6-8 hours as needed for nausea 3. Appt with Dr Lorin Mercy at 1pm at [facility]: [place]  4. Return in 1 week for follow up. Call VAD coordinators over the weekend if your pain is not controlled.   Emerson Monte RN [place] Coordinator  Office: [phone]  24/7 Pager: [phone]

## 2019-08-13 NOTE — Progress Notes (Addendum)
LVAD Clinic Note  Patient ID: Brianna Hanson, female   DOB: [birth year], 84 y.o.   MRN: [id number] Primary Heart Failure: Dr Haroldine Laws Cardiac Surgeon: Dr Prescott Gum  HPI: Brianna Hanson is a 84 y.o. 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 [name] (follwoed q 6 months). Underwent implantation of the HeartMate II LVAD on 04/06/13 for DT. She is not on aspirin due to dizziness. S/P lap cholecystitis 10/2015.   GI Issues 09/2016 LGIB. Angio- no source of bleeding. LVAD speed turned down to 8400  LVAD Speed Change 12/2016 Ramp ECHO -->Speed increased from 8400>8800  Significant Events on VAD Support:  09/21/16 GIB, Ramp echo speed decreased to 8400  01/08/17 - LOW FLOW alarms; speed increased to 8800 RPM 05/20/17>speed decreased to 8600 06/18/19 - DC-CV of AF   Recently found to be in recurrent AF on device interrogation and amio increased to 200 bid. Underwent successful DC-CV on 06/18/19.  Here for routine f/u with [name]:  She is here today for a sick visit due to worsening low back pain. She developed spontaneous acute LBP a week or 2 ago. I saw her and prescribed flexeril and analgesics. This did not help. She saw her PCP who gave her a narcotic and referred for a CT. She had ongoing pain so we had arranged for her to be seen by Dr. Aline August in Ortho but appt not for 2 week.. She presents today with worsening pain. She is in [facility]. Very hard to walk or stand up. No frank weakness. No loss of sensation or bowel/bladder incontinence. No fevers or chills. No hematuria. Denies acute strain. Denies orthopnea or PND. No  problems with driveline. No bleeding, melena or neuro symptoms. No VAD alarms. Taking all meds as prescribed.   She presents to clinic today in a wheelchair. Any movement causes her severe pain in her back. Pt called 11/23 c/o back pain during day to day activities. Prescriptions sent in for Flexeril  and Tramadol. Flexeril gives her no relief, and the Tramadol makes her sleepy. She was seen by her PCP Dr Ronnald Ramp 12/2 for worsening back pain. Spinal xray completed: showed L1 compression fracture. He prescribed her Oxy-IR 5mg . She was unable to tolerate Oxy-IR due to hallucinations. Back pain has continued to progress. Pain 10/10 today. She has been unable to get out of bed much over the last week due to pain. She states her legs feel weak. +n/v. She has an appointment with Dr Lorin Mercy December 22nd. Dr Haroldine Laws called ortho office and was able to get appointment moved to today at 1pm. She has a CT scan scheduled for later this morning. Will send for CT and then ortho appointment.   Per [name] over the last few days pt's flow and PI have been low. They have not heard any low flow alarms but have seen her flow as low as 2.4. She reports that she has been drinking between 2-3 liters a day. BP lower than her normal. Vitals documented below. Will discontinue Amlodipine today per Dr Haroldine Laws. Reports shortness of breath with any activity. Thinks this may be related to pain.   We interrogated ICD personally in clinic today. She is in sinus rhythm. 1 episode of non-sustained tachycardia on 08/02/19. None since then.   VAD Indication: Destination Therapy - age excluding and patient choice    VAD interrogation & Equipment Management: Speed:  8600 Flow: 3.3  Power: 4.0w PI: 2.4  Alarms: 12/10: 16 low flows, 12/9: 6 low flows, 12/8: 1 low flow, 12/5: 13 low flows Events: 120 PI events 12/10; 10-20 other days  Fixed speed 8600 Low speed limit: 8000  Primary Controller: Replace back up battery in 13 months. Back up controller: Replace back up battery in10 months.   Past Medical History:  Diagnosis Date  . AICD (automatic cardioverter/defibrillator) present   . Anemia   . Atrial fibrillation or flutter   . Cardiac arrest - ventricular fibrillation 12/10   with successful resucitation, S/p  ICD  . CHF (congestive heart failure) ([place])   . Diverticula of colon 2011  . HTN (hypertension)    moderate  . ICD (implantable cardiac defibrillator) in place    she has received appropriate therapy for VF  . Internal and external hemorrhoids without complication [id number]  . Nonischemic cardiomyopathy ([place])    follow[facility] Dr April Holding [facility]  . Osteopenia   . Plasma cell disorder 03/20/2012  . Plasma cell disorder 03/20/2012  . Sessile colonic polyp 2011   Dr Benson [place]    Current Outpatient Medications  Medication Sig Dispense Refill  . amiodarone (PACERONE) 200 MG tablet Take 1 tablet (200 mg total) by mouth daily. 90 tablet 3  . carvedilol (COREG) 6.25 MG tablet TAKE 1 TABLET BY MOUTH 2 TIMES DAILY WITH A MEAL (Patient taking differently: Take 6.25 mg by mouth 2 (two) times daily with a meal. ) 180 tablet 3  . Cholecalciferol (VITAMIN D3) 25 MCG (1000 UT) CAPS Take 1,000 Units by mouth daily.     . cyclobenzaprine (FLEXERIL) 5 MG tablet Take 1 tablet (5 mg total) by mouth 2 (two) times daily as needed for muscle spasms. 60 tablet 0  . docusate sodium (COLACE) 100 MG capsule Take 100 mg by mouth daily.    . ferrous sulfate 325 (65 FE) MG tablet Take 1 tablet (325 mg total) by mouth 2 (two) times daily with a meal. 60 tablet 6  . furosemide (LASIX) 20 MG tablet Take 1 tablet (20 mg total) by mouth daily as needed (Only when recommended by CHF clinic.). 30 tablet 6  . gabapentin (NEURONTIN) 100 MG capsule TAKE 1 CAPSULE BY MOUTH EVERYDAY AT BEDTIME 90 capsule 3  . hydrALAZINE (APRESOLINE) 100 MG tablet TAKE 1 TABLET BY MOUTH 3 TIMES A DAY 270 tablet 9  . KLOR-CON M20 20 MEQ tablet TAKE 1 TABLET BY MOUTH TWICE A DAY 180 tablet 3  . losartan (COZAAR) 100 MG tablet Take 1 tablet (100 mg total) by mouth daily. 90 tablet 3  . traMADol (ULTRAM) 50 MG tablet Take 2 tablets (100 mg total) by mouth every 12 (twelve) hours as needed for moderate pain. 60 tablet 3  . warfarin (COUMADIN) 2.5 MG tablet TAKE  1 TABLET BY MOUTH DAILY AT 6 PM FOR 1 DOSE. TAKE 2 TABS DAILY EXCEPT 1 & 1/2 TABS ON TUESDAYS (Patient taking differently: Take 2.5-5 mg by mouth See admin instructions. Take 2.5 mg (1 tab) every Tues/Thurs/Sat and 5 mg (2 tabs) all other days or as directed by HF clinic) 180 tablet 3  . ondansetron (ZOFRAN) 4 MG tablet Take 1 tablet (4 mg total) by mouth every 8 (eight) hours as needed for nausea or vomiting. 20 tablet 3  . oxyCODONE (OXY IR/ROXICODONE) 5 MG immediate release tablet Take 1 tablet (5 mg total) by mouth every 4 (four) hours as needed for severe pain. (Patient not taking:  Reported on 08/13/2019) 75 tablet 0  . pantoprazole (PROTONIX) 40 MG tablet Take 1 tablet (40 mg total) by mouth daily. (Patient not taking: Reported on 08/04/2019) 90 tablet 3   No current facility-administered medications for this encounter.   No Known Allergies  Review of systems complete and found to be negative unless listed in HPI.    Vital Signs:  Temp: 97.5 Doppler Pressure: 76 Automatc BP: 82/67 (72) SPO2:  97% on RA  Weight: UTO lb w/o eqt Last weight: 182.6 lbs   ASSESSMENT AND PLAN:  1) Acute low back pain - she has had pain for > 2 weeks and not improving.  - no frank motor or sensory loss - has not responded to muscle relaxants and narcotic pain control - We have contacted [facility] who will see today. (Appreciate their help) She also has her CT today - Cannot get back injection due to coumadin AC  2) Chronic systolic HF: NICM, s/p ICD and LVAD for DT(04/2013).  - now 6+ years out.  - Stable. Typically NYHA I-II but now worse due to back pain  - Volume status looks good  - lasix only prn  3) LVAD placed for DT 04/2013:  - Failed Entresto due to diuresis and incessant low flow alarms.  - Continue losartan 100 mg daily.  - Now with more low flows despite good fluid intake. MAPs on low end. Will stop amlodpine - No ASA with GI bleed and intolerance.  - Continue coumadin. No  bleeding.  - VAD interrogation as above - LDH up to hemolysis of labs in tube  4) PAF:  - Did not tolerate AF well at all.  - S/p DC-CV 06/18/19 - Remains in amio 200 daily.  - Continue warfarin - ICD interrogation done personally no VT of further PAF  5) HTN:  - MAPs on low end. Stop amlodipine.    6) Chronic Anticoagulation:  - As above.   - Goal INR 1.8-2.3.  - INR 3.1 Discussed dosing with PharmD personally. - Hgb stable 12.9  Total time spent 35 minutes. Over half that time spent discussing above.    Brianna Bickers, MD 4:37 PM

## 2019-08-13 NOTE — Progress Notes (Signed)
Office Visit Note   Patient: Brianna Hanson           Date of Birth: 10/20/1934           MRN: [id number] Visit Date: 08/13/2019              Requested by: Jolaine Artist, MD [address] [place],  [place] [address] PCP: Janith Lima, MD   Assessment & Plan: Visit Diagnoses:  1. Compression fracture of L1 vertebra with routine healing, subsequent encounter     Plan: Patient still has the oxycodone at home.  She can break it apart need to take a fourth or third of a tablet and I think this would be effective for her since she is very sensitive to the narcotic medication.  We reviewed CT scan with patient and her husband.  She has only 3 mm of retropulsion and 75% height loss.  She obviously has osteoporosis related to limited ambulation and cardiac disease.  I plan to recheck her again in 4 weeks repeat AP lateral of L1 compression fracture.  We discussed fall prevention.  X-rays on return.  We discussed that symptoms should significantly improve in a number of weeks with good resolution of her pain.  I would not recommend cement augmentation at this point.  Follow-Up Instructions: Return in about 4 weeks (around 09/10/2019).   Orders:  No orders of the defined types were placed in this encounter.  No orders of the defined types were placed in this encounter.     Procedures: No procedures performed   Clinical Data: No additional findings.   Subjective: Chief Complaint  Patient presents with  . Lower Back - Pain    HPI 84 year old female here for L1 compression fracture.  She woke up with pain she been doing some bending the day before she is here with her husband who confirms the story of no history of falling.  She has had some back pain some of the pain radiates to the left side no numbness or tingling.  She has tried tramadol without relief she tried some oxycodone 5 mg IR and stated it made her confused loopy and she did know who she was for several  hours.  Positive history for a cardiomyopathy she has got a ventricular assist device been in heart failure has a defibrillator pacemaker.  She is in a wheelchair currently.  No numbness tingling or weakness in her legs.  She has exercise intolerance due to her cardiac history.  Review of Systems review of systems positive for chronic anticoagulation.  Heart failure, on ventricular assist device, defibrillator pacemaker.  L1 compression fracture 42 weeks old.  Otherwise negative as pertains HPI.   Objective: Vital Signs: BP (!) 80/66   Ht 5' (1.524 m)   Wt 178 lb (80.7 kg)   BMI 34.76 kg/m   Physical Exam Constitutional:      Appearance: She is well-developed.  HENT:     Head: Normocephalic.     Right Ear: External ear normal.     Left Ear: External ear normal.  Eyes:     Pupils: Pupils are equal, round, and reactive to light.  Neck:     Thyroid: No thyromegaly.     Trachea: No tracheal deviation.  Cardiovascular:     Rate and Rhythm: Normal rate.  Pulmonary:     Effort: Pulmonary effort is normal.  Abdominal:     Palpations: Abdomen is soft.  Skin:  General: Skin is warm and dry.  Neurological:     Mental Status: She is alert and oriented to person, place, and time.  Psychiatric:        Behavior: Behavior normal.     Ortho Exam patient has intact sensation dermatomes lower extremities.  Good quad strength right and left.  Specialty Comments:  No specialty comments available.  Imaging: Study Result  CLINICAL DATA:  Left low back pain for 10 days.  No known injury.  EXAM: LUMBAR SPINE - COMPLETE 4+ VIEW  COMPARISON:  Plain films lumbar spine 08/08/2012.  FINDINGS: The patient has an L1 superior endplate compression fracture with vertebral body height loss approximately 50%. The fracture is new since 2013 but age indeterminate. No other fracture is identified. Intervertebral disc space height is maintained. Lower lumbar facet arthropathy is seen.  Paraspinous structures demonstrate an intra-aortic balloon pump and atherosclerosis.  IMPRESSION: Age indeterminate L1 superior endplate compression fracture. No other acute abnormality.  Lower lumbar facet degenerative disease.  Atherosclerosis.   Electronically Signed   By: Inge Rise M.D.   On: 08/04/2019 14:33   CLINICAL DATA:  Low back pain for 4 weeks.  EXAM: CT LUMBAR SPINE WITHOUT CONTRAST  TECHNIQUE: Multidetector CT imaging of the lumbar spine was performed without intravenous contrast administration. Multiplanar CT image reconstructions were also generated.  COMPARISON:  Lumbar spine x-ray 08/04/2019  FINDINGS: Segmentation: 5 lumbar type vertebrae.  Alignment: Normal.  Vertebrae: Severe L1 vertebral body compression fracture with approximately 75% height loss and 3 mm of retropulsion of the superior posterior margin of the L1 vertebral body. Remainder the vertebral body heights are maintained. No discitis or osteomyelitis. No aggressive osseous lesion.  Paraspinal and other soft tissues: No acute paraspinal abnormality. Abdominal aortic atherosclerosis.  Other: Mild osteoarthritis of bilateral SI joints.  Disc levels:  Disc spaces are relatively well maintained.  At L2-3 there is a broad-based disc bulge. Bilateral mild facet arthropathy. No foraminal stenosis.  At L3-4 there is a mild broad-based disc bulge. Mild bilateral facet arthropathy with ligamentum flavum infolding. Suggestion mild spinal stenosis. No foraminal stenosis.  At L4-5 broad-based disc bulge. Mild bilateral facet arthropathy. Moderate spinal stenosis. No foraminal stenosis.  At L5-S1 there is a broad-based disc bulge. Moderate bilateral facet arthropathy. No foraminal stenosis.  IMPRESSION: 1. Severe L1 vertebral body compression fracture with approximately 75% height loss and 3 mm of retropulsion of the superior posterior margin of the L1  vertebral body. 2. Lumbar spine spondylosis as described above. 3.  Aortic Atherosclerosis (ICD10-I70.0).   Electronically Signed   By: Kathreen Devoid   On: 08/13/2019 13:51    PMFS History: Patient Active Problem List   Diagnosis Date Noted  . Lumbar compression fracture ([place]) 08/13/2019  . Acute left-sided low back pain with left-sided sciatica 08/04/2019  . Closed wedge compression fracture of first lumbar vertebra ([place]) 08/04/2019  . Hyperlipidemia LDL goal <130 03/04/2018  . Eczema of lower extremity 03/04/2018  . Diverticulosis of colon with hemorrhage   . Primary osteoarthritis of left knee 09/05/2016  . Iron deficiency anemia 07/11/2015  . Chronic anticoagulation 11/24/2013  . Chronic systolic congestive heart failure ([place]) 05/01/2013  . LVAD (left ventricular assist device) present ([place]) 04/07/2013  . Right heart failure ([place]) 03/29/2013  . PAH (pulmonary artery hypertension) ([place]) 03/29/2013  . Plasma cell disorder 03/20/2012  . Long term current use of anticoagulant therapy 02/14/2012  . Other abnormal glucose 02/03/2012  . Chronic renal insufficiency, stage II (mild)  02/03/2012  . Atrial fibrillation ([place]) 11/16/2010  . Essential hypertension, benign 05/13/2007  . CARDIOMYOPATHY, PRIMARY NEC 05/13/2007  . OSTEOPENIA 05/13/2007   Past Medical History:  Diagnosis Date  . AICD (automatic cardioverter/defibrillator) present   . Anemia   . Atrial fibrillation or flutter   . Cardiac arrest - ventricular fibrillation 12/10   with successful resucitation, S/p ICD  . CHF (congestive heart failure) ([place])   . Diverticula of colon 2011  . HTN (hypertension)    moderate  . ICD (implantable cardiac defibrillator) in place    she has received appropriate therapy for VF  . Internal and external hemorrhoids without complication [id number]  . Nonischemic cardiomyopathy ([place])    [facility] Dr April [facility] at [facility]  . Osteopenia   . Plasma cell disorder 03/20/2012  . Plasma cell  disorder 03/20/2012  . Sessile colonic polyp 2011   Dr Benson [place]    Family History  Problem Relation Age of Onset  . Stroke Mother   . Stroke Brother     Past Surgical History:  Procedure Laterality Date  . ABDOMINAL HYSTERECTOMY    . CARDIAC CATHETERIZATION    . CARDIAC DEFIBRILLATOR PLACEMENT     by Greggory Brandy for secondary prevention of sudden death  . CARDIOVERSION N/A 06/23/19   Procedure: CARDIOVERSION;  Surgeon: Jolaine Artist, MD;  Location: [facility] ENDOSCOPY;  Service: Cardiovascular;  Laterality: N/A;  . CHOLECYSTECTOMY N/A 10/09/2015   Procedure: LAPAROSCOPIC CHOLECYSTECTOMY;  Surgeon: Rolm Bookbinder, MD;  Location: [place];  Service: General;  Laterality: N/A;  . COLONOSCOPY W/ POLYPECTOMY  2011   Dr Benson Norway  . INSERTION OF IMPLANTABLE LEFT VENTRICULAR ASSIST DEVICE N/A 04/06/2013   Procedure: INSERTION OF IMPLANTABLE LEFT VENTRICULAR ASSIST DEVICE;  Surgeon: Gaye Pollack, MD;  Location: [place];  Service: Open Heart Surgery;  Laterality: N/A;  . INTRAOPERATIVE TRANSESOPHAGEAL ECHOCARDIOGRAM N/A 04/06/2013   Procedure: INTRAOPERATIVE TRANSESOPHAGEAL ECHOCARDIOGRAM;  Surgeon: Gaye Pollack, MD;  Location: [place] OR;  Service: Open Heart Surgery;  Laterality: N/A;  . IR GENERIC HISTORICAL  09/24/2016   IR US GUIDE VASC ACCESS RIGHT 09/24/2016 Greggory Keen, MD [facility]INTER[facility]  . IR GENERIC HISTORICAL  09/24/2016   IR ANGIOGRAM VISCERAL SELECTIVE 09/24/2016 Greggory Keen, MD [facility]  . IR GENERIC HISTORICAL  09/24/2016   IR ANGIOGRAM VISCERAL SELECTIVE 09/24/2016 Greggory Keen, MD [facility]  . PLACEMENT OF CENTRIMAG VENTRICULAR ASSIST DEVICE Right 04/06/2013   Procedure: PLACEMENT OF CENTRIMAG VENTRICULAR ASSIST DEVICE;  Surgeon: Gaye Pollack, MD;  Location: [place];  Service: Open Heart Surgery;  Laterality: Right;   Social History   Occupational History  . Not on file  Tobacco Use  . Smoking status: Never Smoker  . Smokeless tobacco: Never Used  . Tobacco comment: remote history of  tobacco abuse  Substance and Sexual Activity  . Alcohol use: Yes    Comment: occsionally wine  . Drug use: No  . Sexual activity: Not Currently    Birth control/protection: Surgical

## 2019-08-13 NOTE — Patient Instructions (Addendum)
1. Stop Amlodipine  2. Prescription sent in for Zofran (antinausa medication) You may take every 6-8 hours as needed for nausea 3. Appt with Dr Lorin Mercy at 1pm at [facility]: [place]  4. Return in 1 week for follow up

## 2019-08-16 ENCOUNTER — Other Ambulatory Visit (HOSPITAL_COMMUNITY): Payer: Self-pay

## 2019-08-16 MED ORDER — POTASSIUM CHLORIDE CRYS ER 20 MEQ PO TBCR: 20.0000 meq | EXTENDED_RELEASE_TABLET | Freq: Two times a day (BID) | ORAL | 3 refills | Status: AC

## 2019-08-17 ENCOUNTER — Other Ambulatory Visit (HOSPITAL_COMMUNITY): Payer: Self-pay | Admitting: *Deleted

## 2019-08-17 ENCOUNTER — Ambulatory Visit (HOSPITAL_COMMUNITY): Payer: Self-pay | Admitting: Pharmacist

## 2019-08-17 DIAGNOSIS — Z95811 Presence of heart assist device: Secondary | ICD-10-CM

## 2019-08-17 DIAGNOSIS — Z7901 Long term (current) use of anticoagulants: Secondary | ICD-10-CM

## 2019-08-17 LAB — POCT INR: INR: 3.7 — AB (ref 2.0–3.0)

## 2019-08-17 NOTE — Progress Notes (Signed)
LVAD INR 

## 2019-08-19 ENCOUNTER — Encounter (HOSPITAL_COMMUNITY): Payer: Medicare Other

## 2019-08-19 ENCOUNTER — Inpatient Hospital Stay (HOSPITAL_COMMUNITY): Admission: RE | Admit: 2019-08-19 | Payer: Medicare Other | Source: Ambulatory Visit

## 2019-08-24 ENCOUNTER — Ambulatory Visit: Payer: Medicare Other | Admitting: Orthopaedic Surgery

## 2019-08-24 ENCOUNTER — Ambulatory Visit (HOSPITAL_COMMUNITY): Payer: Self-pay | Admitting: Pharmacist

## 2019-08-24 LAB — POCT INR: INR: 4.4 — AB (ref 2.0–3.0)

## 2019-08-24 MED ORDER — WARFARIN SODIUM 2.5 MG PO TABS: ORAL_TABLET | ORAL | 3 refills | Status: DC

## 2019-08-24 NOTE — Progress Notes (Signed)
LVAD INR 

## 2019-08-25 ENCOUNTER — Ambulatory Visit: Payer: Medicare Other | Admitting: Internal Medicine

## 2019-08-28 NOTE — Progress Notes (Signed)
ICD remote 

## 2019-08-31 ENCOUNTER — Ambulatory Visit (HOSPITAL_COMMUNITY): Payer: Self-pay | Admitting: Pharmacist

## 2019-08-31 LAB — POCT INR: INR: 1.7 — AB (ref 2.0–3.0)

## 2019-08-31 NOTE — Progress Notes (Signed)
LVAD INR 

## 2019-09-06 ENCOUNTER — Encounter: Payer: Self-pay | Admitting: Internal Medicine

## 2019-09-06 ENCOUNTER — Ambulatory Visit (INDEPENDENT_AMBULATORY_CARE_PROVIDER_SITE_OTHER): Payer: Medicare Other | Admitting: Internal Medicine

## 2019-09-06 ENCOUNTER — Ambulatory Visit (INDEPENDENT_AMBULATORY_CARE_PROVIDER_SITE_OTHER): Payer: Medicare Other

## 2019-09-06 ENCOUNTER — Other Ambulatory Visit: Payer: Self-pay

## 2019-09-06 ENCOUNTER — Telehealth: Payer: Self-pay

## 2019-09-06 VITALS — BP 100/72 | HR 86 | Temp 97.7°F | Ht 60.0 in | Wt 172.2 lb

## 2019-09-06 DIAGNOSIS — M25552 Pain in left hip: Secondary | ICD-10-CM

## 2019-09-06 DIAGNOSIS — M25551 Pain in right hip: Secondary | ICD-10-CM

## 2019-09-06 DIAGNOSIS — M160 Bilateral primary osteoarthritis of hip: Secondary | ICD-10-CM | POA: Diagnosis not present

## 2019-09-06 DIAGNOSIS — M8000XA Age-related osteoporosis with current pathological fracture, unspecified site, initial encounter for fracture: Secondary | ICD-10-CM | POA: Insufficient documentation

## 2019-09-06 DIAGNOSIS — M8000XG Age-related osteoporosis with current pathological fracture, unspecified site, subsequent encounter for fracture with delayed healing: Secondary | ICD-10-CM | POA: Diagnosis not present

## 2019-09-06 NOTE — Telephone Encounter (Signed)
patieint's insurance will be verified for evenity---we will call patient to discuss summary of benefits

## 2019-09-06 NOTE — Telephone Encounter (Signed)
-----   Message from [facility], [place] sent at 09/06/2019  1:37 PM EST ----- Regarding: Evenity Can you run coverage for pt to receive Evenity please?

## 2019-09-06 NOTE — Patient Instructions (Signed)
Osteoporosis  Osteoporosis is thinning and loss of density in your bones. Osteoporosis makes bones more brittle and fragile and more likely to break (fracture). Over time, osteoporosis can cause your bones to become so weak that they fracture after a minor fall. Bones in the hip, wrist, and spine are most likely to fracture due to osteoporosis. What are the causes? The exact cause of this condition is not known. What increases the risk? You may be at greater risk for osteoporosis if you:  Have a family history of the condition.  Have poor nutrition.  Use steroid medicines, such as prednisone.  Are female.  Are age 54 or older.  Smoke or have a history of smoking.  Are not physically active (are sedentary).  Are white (Caucasian) or of Asian descent.  Have a small body frame.  Take certain medicines, such as antiseizure medicines. What are the signs or symptoms? A fracture might be the first sign of osteoporosis, especially if the fracture results from a fall or injury that usually would not cause a bone to break. Other signs and symptoms include:  Pain in the neck or low back.  Stooped posture.  Loss of height. How is this diagnosed? This condition may be diagnosed based on:  Your medical history.  A physical exam.  A bone mineral density test, also called a DXA or DEXA test (dual-energy X-ray absorptiometry test). This test uses X-rays to measure the amount of minerals in your bones. How is this treated? The goal of treatment is to strengthen your bones and lower your risk for a fracture. Treatment may involve:  Making lifestyle changes, such as: ? Including foods with more calcium and vitamin D in your diet. ? Doing weight-bearing and muscle-strengthening exercises. ? Stopping tobacco use. ? Limiting alcohol intake.  Taking medicine to slow the process of bone loss or to increase bone density.  Taking daily supplements of calcium and vitamin D.  Taking  hormone replacement medicines, such as estrogen for women and testosterone for men.  Monitoring your levels of calcium and vitamin D. Follow these instructions at home:  Activity  Exercise as told by your health care provider. Ask your health care provider what exercises and activities are safe for you. You should do: ? Exercises that make you work against gravity (weight-bearing exercises), such as tai chi, yoga, or walking. ? Exercises to strengthen muscles, such as lifting weights. Lifestyle  Limit alcohol intake to no more than 1 drink a day for nonpregnant women and 2 drinks a day for men. One drink equals 12 oz of beer, 5 oz of wine, or 1 oz of hard liquor.  Do not use any products that contain nicotine or tobacco, such as cigarettes and e-cigarettes. If you need help quitting, ask your health care provider. Preventing falls  Use devices to help you move around (mobility aids) as needed, such as canes, walkers, scooters, or crutches.  Keep rooms well-lit and clutter-free.  Remove tripping hazards from walkways, including cords and throw rugs.  Install grab bars in bathrooms and safety rails on stairs.  Use rubber mats in the bathroom and other areas that are often wet or slippery.  Wear closed-toe shoes that fit well and support your feet. Wear shoes that have rubber soles or low heels.  Review your medicines with your health care provider. Some medicines can cause dizziness or changes in blood pressure, which can increase your risk of falling. General instructions  Include calcium and vitamin D in  your diet. Calcium is important for bone health, and vitamin D helps your body to absorb calcium. Good sources of calcium and vitamin D include: ? Certain fatty fish, such as salmon and tuna. ? Products that have calcium and vitamin D added to them (fortified products), such as fortified cereals. ? Egg yolks. ? Cheese. ? Liver.  Take over-the-counter and prescription medicines  only as told by your health care provider.  Keep all follow-up visits as told by your health care provider. This is important. Contact a health care provider if:  You have never been screened for osteoporosis and you are: ? A woman who is age 55 or older. ? A man who is age 20 or older. Get help right away if:  You fall or injure yourself. Summary  Osteoporosis is thinning and loss of density in your bones. This makes bones more brittle and fragile and more likely to break (fracture),even with minor falls.  The goal of treatment is to strengthen your bones and reduce your risk for a fracture.  Include calcium and vitamin D in your diet. Calcium is important for bone health, and vitamin D helps your body to absorb calcium.  Talk with your health care provider about screening for osteoporosis if you are a woman who is age 48 or older, or a man who is age 15 or older. This information is not intended to replace advice given to you by your health care provider. Make sure you discuss any questions you have with your health care provider. Document Revised: 08/01/2017 Document Reviewed: 06/13/2017 Elsevier Patient Education  2020 [employer].

## 2019-09-06 NOTE — Progress Notes (Signed)
Subjective:  Patient ID: Brianna Hanson, female    DOB: 02-06-1935  Age: 85 y.o. MRN: [id number]  CC: Back Pain and Hip Pain   HPI NAEOMI MARMOLEJOS presents for f/up - She is no longer taking oxycodone for pain because it caused hallucinations.  She is controlling her pain with tramadol.  Her low back pain has resolved but for 3 weeks she has developed pain in both hips.  She denies a trauma or injury. She says the pain is only noticed with activity and does not radiate into her lower extremities.  She denies paresthesias.  Outpatient Medications Prior to Visit  Medication Sig Dispense Refill  . amiodarone (PACERONE) 200 MG tablet Take 1 tablet (200 mg total) by mouth daily. 90 tablet 3  . carvedilol (COREG) 6.25 MG tablet TAKE 1 TABLET BY MOUTH 2 TIMES DAILY WITH A MEAL 180 tablet 3  . Cholecalciferol (VITAMIN D3) 25 MCG (1000 UT) CAPS Take 1,000 Units by mouth daily.     . cyclobenzaprine (FLEXERIL) 5 MG tablet TAKE 1 TABLET BY MOUTH TWICE A DAY AS NEEDED FOR MUSCLE SPASM 60 tablet 0  . docusate sodium (COLACE) 100 MG capsule Take 100 mg by mouth daily.    . ferrous sulfate 325 (65 FE) MG tablet Take 1 tablet (325 mg total) by mouth 2 (two) times daily with a meal. 60 tablet 6  . furosemide (LASIX) 20 MG tablet Take 1 tablet (20 mg total) by mouth daily as needed (Only when recommended by CHF clinic.). 30 tablet 6  . gabapentin (NEURONTIN) 100 MG capsule TAKE 1 CAPSULE BY MOUTH EVERYDAY AT BEDTIME 90 capsule 3  . hydrALAZINE (APRESOLINE) 100 MG tablet TAKE 1 TABLET BY MOUTH 3 TIMES A DAY 270 tablet 9  . losartan (COZAAR) 100 MG tablet Take 1 tablet (100 mg total) by mouth daily. 90 tablet 3  . potassium chloride SA (KLOR-CON M20) 20 MEQ tablet Take 1 tablet (20 mEq total) by mouth 2 (two) times daily. 180 tablet 3  . traMADol (ULTRAM) 50 MG tablet Take 2 tablets (100 mg total) by mouth every 12 (twelve) hours as needed for moderate pain. 60 tablet 3  . warfarin (COUMADIN) 2.5 MG tablet TAKE 5  mg (2 tabs) by mouth every Monday and 2.5 mg (1 tab) all other days or as directed by HF Clinic 180 tablet 3  . ondansetron (ZOFRAN) 4 MG tablet Take 1 tablet (4 mg total) by mouth every 8 (eight) hours as needed for nausea or vomiting. 20 tablet 3  . oxyCODONE (OXY IR/ROXICODONE) 5 MG immediate release tablet Take 1 tablet (5 mg total) by mouth every 4 (four) hours as needed for severe pain. (Patient not taking: Reported on 08/13/2019) 75 tablet 0  . pantoprazole (PROTONIX) 40 MG tablet Take 1 tablet (40 mg total) by mouth daily. (Patient not taking: Reported on 08/04/2019) 90 tablet 3   No facility-administered medications prior to visit.    ROS Review of Systems  Constitutional: Negative.   HENT: Negative.   Eyes: Negative.   Respiratory: Negative for cough, chest tightness, shortness of breath and wheezing.   Cardiovascular: Negative for chest pain, palpitations and leg swelling.  Gastrointestinal: Negative for abdominal pain, constipation, diarrhea, nausea and vomiting.  Endocrine: Negative.   Genitourinary: Negative.  Negative for difficulty urinating.  Musculoskeletal: Positive for arthralgias. Negative for back pain and myalgias.  Skin: Negative.     Objective:  BP 100/72 (BP Location: Left Arm, Patient Position: Sitting, Cuff  Size: Large)   Pulse 86   Temp 97.7 F (36.5 C) (Oral)   Ht 5' (1.524 m)   Wt 172 lb 4 oz (78.1 kg)   SpO2 97%   BMI 33.64 kg/m   BP Readings from Last 3 Encounters:  09/06/19 100/72  08/13/19 (!) 80/66  08/13/19 (!) 80/66    Wt Readings from Last 3 Encounters:  09/06/19 172 lb 4 oz (78.1 kg)  08/13/19 178 lb (80.7 kg)  08/04/19 178 lb 8 oz (81 kg)    Physical Exam Vitals reviewed.  Constitutional:      Appearance: She is obese.     Comments: Wheelchair bound  Eyes:     General: No scleral icterus.    Conjunctiva/sclera: Conjunctivae normal.  Neck:     Vascular: No carotid bruit.  Cardiovascular:     Rate and Rhythm: Normal rate  and regular rhythm.     Heart sounds: No murmur.     Comments: +++ humming sound Pulmonary:     Effort: Pulmonary effort is normal.     Breath sounds: Normal breath sounds. No stridor. No wheezing, rhonchi or rales.  Abdominal:     General: Abdomen is protuberant. Bowel sounds are normal. There is no distension.     Palpations: Abdomen is soft. There is no hepatomegaly or splenomegaly.     Tenderness: There is no abdominal tenderness.  Musculoskeletal:        General: Normal range of motion.     Cervical back: Neck supple.     Lumbar back: Normal.     Right hip: Normal. No deformity, tenderness or bony tenderness. Normal range of motion.     Left hip: Normal. No deformity, tenderness or bony tenderness. Normal range of motion.     Right upper leg: Normal.     Left upper leg: Normal.     Right knee: Normal.     Left knee: Normal.     Right lower leg: No deformity. No edema.     Left lower leg: No deformity. No edema.     Comments: Neg SLR in BLE  Lymphadenopathy:     Cervical: No cervical adenopathy.  Skin:    General: Skin is warm.  Neurological:     General: No focal deficit present.     Mental Status: She is alert and oriented to person, place, and time. Mental status is at baseline.  Psychiatric:        Hanson and Affect: Hanson normal.        Behavior: Behavior normal.     Lab Results  Component Value Date   WBC 5.9 08/13/2019   HGB 12.9 08/13/2019   HCT 40.3 08/13/2019   PLT 280 08/13/2019   GLUCOSE 102 (H) 08/13/2019   CHOL 160 05/19/2018   TRIG 189 (H) 05/19/2018   HDL 44 05/19/2018   LDLCALC 78 05/19/2018   ALT 23 04/08/2019   AST 27 04/08/2019   NA 136 08/13/2019   K 5.7 (H) 08/13/2019   CL 106 08/13/2019   CREATININE 1.58 (H) 08/13/2019   BUN 15 08/13/2019   CO2 20 (L) 08/13/2019   TSH 2.88 03/04/2018   INR 1.8 (A) 09/07/2019   HGBA1C 5.2 03/04/2018    CT Lumbar Spine Wo Contrast  Result Date: 08/13/2019 CLINICAL DATA:  Low back pain for 4 weeks.  EXAM: CT LUMBAR SPINE WITHOUT CONTRAST TECHNIQUE: Multidetector CT imaging of the lumbar spine was performed without intravenous contrast administration. Multiplanar CT image reconstructions were  also generated. COMPARISON:  Lumbar spine x-ray 08/04/2019 FINDINGS: Segmentation: 5 lumbar type vertebrae. Alignment: Normal. Vertebrae: Severe L1 vertebral body compression fracture with approximately 75% height loss and 3 mm of retropulsion of the superior posterior margin of the L1 vertebral body. Remainder the vertebral body heights are maintained. No discitis or osteomyelitis. No aggressive osseous lesion. Paraspinal and other soft tissues: No acute paraspinal abnormality. Abdominal aortic atherosclerosis. Other: Mild osteoarthritis of bilateral SI joints. Disc levels: Disc spaces are relatively well maintained. At L2-3 there is a broad-based disc bulge. Bilateral mild facet arthropathy. No foraminal stenosis. At L3-4 there is a mild broad-based disc bulge. Mild bilateral facet arthropathy with ligamentum flavum infolding. Suggestion mild spinal stenosis. No foraminal stenosis. At L4-5 broad-based disc bulge. Mild bilateral facet arthropathy. Moderate spinal stenosis. No foraminal stenosis. At L5-S1 there is a broad-based disc bulge. Moderate bilateral facet arthropathy. No foraminal stenosis. IMPRESSION: 1. Severe L1 vertebral body compression fracture with approximately 75% height loss and 3 mm of retropulsion of the superior posterior margin of the L1 vertebral body. 2. Lumbar spine spondylosis as described above. 3.  Aortic Atherosclerosis (ICD10-I70.0). Electronically Signed   By: Kathreen Devoid   On: 08/13/2019 13:51    DG HIPS BILAT W OR W/O PELVIS MIN 5 VIEWS  Result Date: 09/06/2019 CLINICAL DATA:  Bilateral hip pain x3 weeks EXAM: DG HIP (WITH OR WITHOUT PELVIS) 5+V BILAT COMPARISON:  None. FINDINGS: There is no acute displaced fracture or dislocation. Heterogeneous osteopenia is noted. Phleboliths project  over the patient's pelvis. There is mild-to-moderate osteoarthritis involving both hips. IMPRESSION: 1. No acute displaced fracture or dislocation. 2. Mild-to-moderate bilateral hip osteoarthritis. 3. Osteopenia. Electronically Signed   By: Constance Holster M.D.   On: 09/06/2019 16:02    Assessment & Plan:   Macayla was seen today for back pain and hip pain.  Diagnoses and all orders for this visit:  Acute hip pain, bilateral- plain films are neg for fracture, + for DJD. -     DG HIPS BILAT W OR W/O PELVIS MIN 5 VIEWS; Future  Osteoporosis with pathological fracture with delayed healing, subsequent encounter- I recommend that she start evenity to build bone and reduce the risk of additional fractures  Primary osteoarthritis of both hips- She will continue tramadol as needed for the pain.   I have discontinued Willene Hatchet. Roseboom's pantoprazole, oxyCODONE, and ondansetron. I am also having her maintain her ferrous sulfate, docusate sodium, Vitamin D3, hydrALAZINE, gabapentin, traMADol, amiodarone, losartan, furosemide, carvedilol, cyclobenzaprine, and potassium chloride SA.  No orders of the defined types were placed in this encounter.    Follow-up: Return if symptoms worsen or fail to improve.  Scarlette Calico, MD

## 2019-09-07 ENCOUNTER — Ambulatory Visit (HOSPITAL_COMMUNITY): Payer: Self-pay | Admitting: Pharmacist

## 2019-09-07 ENCOUNTER — Telehealth (HOSPITAL_COMMUNITY): Payer: Self-pay | Admitting: *Deleted

## 2019-09-07 DIAGNOSIS — M160 Bilateral primary osteoarthritis of hip: Secondary | ICD-10-CM | POA: Insufficient documentation

## 2019-09-07 LAB — POCT INR: INR: 1.8 — AB (ref 2.0–3.0)

## 2019-09-07 MED ORDER — WARFARIN SODIUM 2.5 MG PO TABS: ORAL_TABLET | ORAL | 3 refills | Status: DC

## 2019-09-07 NOTE — Progress Notes (Signed)
LVAD INR 

## 2019-09-07 NOTE — Telephone Encounter (Signed)
Called [name] to check on Lawrence's condition. He says her back pain is better, but is now having left hip pain. Saw PCP yesterday, stopped Protonix, Oxy and started Evenity. X-rays of back and hips performed at that visit.  Pt has f/u with Dr. Rodell Perna (Orthopedics) on Friday of this week, 09/10/19.  [name] wants to schedule VAD and IV iron appt at this time.     VAD clinic appt: Tues 09/14/19 at 11:00 am    IV iron in Short Stay: Tues 09/14/19 at 10:00   Parking code: [place] agreed to above.  Zada Girt RN, [place] Coordinator [phone]

## 2019-09-10 ENCOUNTER — Ambulatory Visit: Payer: Medicare Other | Admitting: Orthopaedic Surgery

## 2019-09-13 ENCOUNTER — Other Ambulatory Visit (HOSPITAL_COMMUNITY): Payer: Self-pay | Admitting: *Deleted

## 2019-09-14 ENCOUNTER — Ambulatory Visit (HOSPITAL_COMMUNITY): Payer: Self-pay | Admitting: Pharmacist

## 2019-09-14 ENCOUNTER — Ambulatory Visit (HOSPITAL_COMMUNITY)
Admission: RE | Admit: 2019-09-14 | Discharge: 2019-09-14 | Disposition: A | Discharge status: Home / Self Care | Payer: Medicare Other | Source: Ambulatory Visit | Attending: Internal Medicine | Admitting: Internal Medicine

## 2019-09-14 ENCOUNTER — Other Ambulatory Visit: Payer: Self-pay

## 2019-09-14 ENCOUNTER — Ambulatory Visit (HOSPITAL_COMMUNITY)
Admission: RE | Admit: 2019-09-14 | Discharge: 2019-09-14 | Disposition: A | Discharge status: Home / Self Care | Payer: Medicare Other | Source: Ambulatory Visit | Attending: Cardiology | Admitting: Cardiology

## 2019-09-14 DIAGNOSIS — I480 Paroxysmal atrial fibrillation: Secondary | ICD-10-CM

## 2019-09-14 DIAGNOSIS — D649 Anemia, unspecified: Secondary | ICD-10-CM

## 2019-09-14 DIAGNOSIS — M160 Bilateral primary osteoarthritis of hip: Secondary | ICD-10-CM

## 2019-09-14 DIAGNOSIS — M546 Pain in thoracic spine: Secondary | ICD-10-CM

## 2019-09-14 DIAGNOSIS — I5022 Chronic systolic (congestive) heart failure: Secondary | ICD-10-CM | POA: Diagnosis not present

## 2019-09-14 DIAGNOSIS — I10 Essential (primary) hypertension: Secondary | ICD-10-CM

## 2019-09-14 DIAGNOSIS — Z95811 Presence of heart assist device: Secondary | ICD-10-CM | POA: Diagnosis not present

## 2019-09-14 DIAGNOSIS — Z7901 Long term (current) use of anticoagulants: Secondary | ICD-10-CM

## 2019-09-14 LAB — BASIC METABOLIC PANEL
Anion gap: 10 (ref 5–15)
BUN: 11 mg/dL (ref 8–23)
CO2: 22 mmol/L (ref 22–32)
Calcium: 9.9 mg/dL (ref 8.9–10.3)
Chloride: 105 mmol/L (ref 98–111)
Creatinine, Ser: 0.99 mg/dL (ref 0.44–1.00)
GFR calc Af Amer: >60 mL/min (ref >60)
GFR calc non Af Amer: 53 mL/min — ABNORMAL LOW (ref >60)
Glucose, Bld: 97 mg/dL (ref 70–99)
Potassium: 4.6 mmol/L (ref 3.5–5.1)
Sodium: 137 mmol/L (ref 135–145)

## 2019-09-14 LAB — CBC
HCT: 41.3 % (ref 36.0–46.0)
Hemoglobin: 12.7 g/dL (ref 12.0–15.0)
MCH: 30.5 pg (ref 26.0–34.0)
MCHC: 30.8 g/dL (ref 30.0–36.0)
MCV: 99.3 fL (ref 80.0–100.0)
Platelets: 232 10*3/uL (ref 150–400)
RBC: 4.16 MIL/uL (ref 3.87–5.11)
RDW: 15.9 % — ABNORMAL HIGH (ref 11.5–15.5)
WBC: 7.6 10*3/uL (ref 4.0–10.5)
nRBC: 0 % (ref 0.0–0.2)

## 2019-09-14 LAB — LACTATE DEHYDROGENASE: LDH: 266 U/L — ABNORMAL HIGH (ref 98–192)

## 2019-09-14 LAB — PROTIME-INR
INR: 2.1 — ABNORMAL HIGH (ref 0.8–1.2)
Prothrombin Time: 23.8 seconds — ABNORMAL HIGH (ref 11.4–15.2)

## 2019-09-14 LAB — MAGNESIUM: Magnesium: 2.2 mg/dL (ref 1.7–2.4)

## 2019-09-14 MED ORDER — SODIUM CHLORIDE 0.9 % IV SOLN
510.0000 mg | Freq: Once | INTRAVENOUS | Status: AC
  Administered 2019-09-14: 510 mg via INTRAVENOUS
  Filled 2019-09-14: qty 510

## 2019-09-14 MED ORDER — PREDNISONE 10 MG PO TABS: ORAL_TABLET | ORAL | 0 refills | Status: DC

## 2019-09-14 NOTE — Progress Notes (Signed)
LVAD INR 

## 2019-09-14 NOTE — Progress Notes (Signed)
Patient presents for 1 mo follow up in [facility] today with caregiver, [name].  Reports no problems with VAD equipment or concerns with drive line.  Pt arrived in transport chair. Pt has been upstairs getting IV iron and is c/o hip pain that radiates down to leg with standing. She is unable to weigh or get on exam table.  Pt had f/u with Dr. Ronnald Ramp on 09/06/19 with plan to stop Protonix and start Evenity. [name] has not heard back from his office for scheduling of IV Evenity. Pt is still taking Protonix. Dr. Ronnald Ramp also stopped Oxy (due to hallucinations) and has patient on Tramadol only for pain management.   Pt reports she is "so tired of hurting". She is currently taking Tramadol 100 mg once daily when she asked for it. [name] reports she sleeps when she takes it. Per [name] pt may take Tramadol 50 mg more frequently and stagger times for better pain management. He also ordered Prednisone taper.  Pt has f/u scheduled with Dr. Lorin Mercy (Ortho) tomorrow.   High # of Low Flow alarms noted on VAD parameter history. Pt denies any dizziness, lightheadedness, or syncopal episodes. [name] reports she is drinking @ 1/2 of fluid amount than normal, says she is drinking @ 1 liter daily instead of usual 2 liters. [name] asked that she try to increase her fluid intake. He appetite is poor as well.  Vital Signs:  Doppler Pressure:  104 Automatc BP:  112/81 (93) HR: 73  SPO2:  96% on RA  Weight:  lb w/o eqt Last weight: 182.6 lbs  VAD Indication: Destination Therapy - age excluding and patient choice    VAD interrogation & Equipment Management: Speed: 8600 Flow: 4.0 Power: 4.6w    PI: 6.5  Alarms: none Events: rare over last few days; 30 PI events on 10/23; 74 PI events on 10/22  Fixed speed 8600 Low speed limit: 8000  Primary Controller: Replace back up battery in 15 months. Back up controller: Replace back up battery in 10 months.  Exit Site Care: Drive line is being maintained  weekly by [employer]. Drive line exit site well healed and incorporated. The velour is fully implanted at exit site. Dressing dry and intact. No erythema or drainage. Stabilization device present and accurately applied. Pt denies fever or chills. Gave pt 8 dressing kits for home use.   Significant Events on VAD Support:  09/21/16 GIB, Ramp echo speed decreased to 8400  01/08/17 - LOW FLOW alarms; speed increased to 8800 RPM 05/20/17> speed decreased to 8600  Device:[employer] ICD Therapies: on at 200 bpm; monitor on 110 DDD 60 Last check: today: 07/01/19  BP & Labs: Doppler BP 104 -  is reflecting modified systolic  Hgb 99991111 - No S/S of bleeding; denies melena/BRBPR or nosebleeds during the last two months.   LDH 257 - Within the range of  150 - 300. Denies any dark urine.  Patient Instructions: 1. Re-start Amiodarone and take 200 mg daily. 2. Return to [facility] in one month. 3. Will schedule 2nd dose of iron for same day as above.   Zada Girt RN [place] Coordinator  Office: [phone]  24/7 Pager: [phone]

## 2019-09-15 ENCOUNTER — Encounter: Payer: Self-pay | Admitting: Orthopaedic Surgery

## 2019-09-15 ENCOUNTER — Ambulatory Visit (INDEPENDENT_AMBULATORY_CARE_PROVIDER_SITE_OTHER): Payer: Medicare Other | Admitting: Orthopaedic Surgery

## 2019-09-15 ENCOUNTER — Ambulatory Visit (INDEPENDENT_AMBULATORY_CARE_PROVIDER_SITE_OTHER): Payer: Medicare Other

## 2019-09-15 VITALS — Ht 60.0 in | Wt 179.0 lb

## 2019-09-15 DIAGNOSIS — S32010D Wedge compression fracture of first lumbar vertebra, subsequent encounter for fracture with routine healing: Secondary | ICD-10-CM

## 2019-09-15 NOTE — Progress Notes (Signed)
Office Visit Note   Patient: Brianna Hanson           Date of Birth: [birth year]           MRN: [id number] Visit Date: 09/15/2019              Requested by: Janith Lima, MD [address] [place],  [place] [address] PCP: Janith Lima, MD   Assessment & Plan: Visit Diagnoses:  1. Compression fracture of L1 vertebra with routine healing, subsequent encounter     Plan: Patient has further compression of L1.  We discussed options including vertebroplasty however with her severe osteopenia she would be at high risk for compressing adjacent vertebrae after cement treatment of one vertebrae.  She is taking some Ultram for pain which is acceptable.  We will get her rolling walker 5 inch wheels that she can use around the house which would improve her ambulation.  I will recheck her in 6 weeks repeat lateral lumbar x-ray on return.  Follow-Up Instructions: Return in about 6 weeks (around 10/27/2019).   Orders:  Orders Placed This Encounter  Procedures  . XR Lumbar Spine 2-3 Views   No orders of the defined types were placed in this encounter.     Procedures: No procedures performed   Clinical Data: No additional findings.   Subjective: Chief Complaint  Patient presents with  . Lower Back - Fracture, Follow-up    HPI 85 year old female returns for follow-up of L1 compression fracture.  She states she is doing fairly well since I last saw her until Saturday when her pain got worse and she has had more difficulty walking.  Patient has chronic heart failure and for 7 years has had a HeartMate ventricular assist device.  Patient denies any associated bowel bladder symptoms since Saturday she does not have a walker.  Review of Systems 14 point system update unchanged from last office visit other than the mentioned in HPI.   Objective: Vital Signs: Ht 5' (1.524 m)   Wt 179 lb (81.2 kg)   BMI 34.96 kg/m   Physical Exam Constitutional:      Appearance: Normal appearance.    HENT:     Head: Normocephalic.     Right Ear: Tympanic membrane normal.  Cardiovascular:     Rate and Rhythm: Normal rate.     Comments: Wrist pulse not palpable Abdominal:     General: Abdomen is flat.  Skin:    General: Skin is warm and dry.  Neurological:     Mental Status: She is alert.     Ortho Exam patient has intact ankle dorsiflexion plantarflexion face and her legs are symmetrical.  Specialty Comments:  No specialty comments available.  Imaging: XR Lumbar Spine 2-3 Views  Result Date: 09/15/2019 2 view x-rays lumbar spine obtained and reviewed.  Comparison to 08/04/2019 lumbar radiographs.  L1 compression fracture is progressed from 50% to 90%.  There does not appear to be retropulsion of bone into the canal. Impression: L1 progression fracture has progressed now with 90% compression.    PMFS History: Patient Active Problem List   Diagnosis Date Noted  . Primary osteoarthritis of both hips 09/07/2019  . Acute hip pain, bilateral 09/06/2019  . Osteoporosis with pathological fracture 09/06/2019  . Lumbar compression fracture ([place]) 08/13/2019  . Acute left-sided low back pain with left-sided sciatica 08/04/2019  . Closed wedge compression fracture of first lumbar vertebra ([place]) 08/04/2019  . Hyperlipidemia LDL goal <130 03/04/2018  .  Eczema of lower extremity 03/04/2018  . Diverticulosis of colon with hemorrhage   . Primary osteoarthritis of left knee 09/05/2016  . Iron deficiency anemia 07/11/2015  . Chronic anticoagulation 11/24/2013  . Chronic systolic congestive heart failure ([place]) 05/01/2013  . LVAD (left ventricular assist device) present ([place]) 04/07/2013  . Right heart failure ([place]) 03/29/2013  . PAH (pulmonary artery hypertension) ([place]) 03/29/2013  . Plasma cell disorder 03/20/2012  . Long term current use of anticoagulant therapy 02/14/2012  . Other abnormal glucose 02/03/2012  . Chronic renal insufficiency, stage II (mild) 02/03/2012  . Atrial  fibrillation ([place]) 11/16/2010  . Essential hypertension, benign 05/13/2007  . CARDIOMYOPATHY, PRIMARY NEC 05/13/2007  . OSTEOPENIA 05/13/2007   Past Medical History:  Diagnosis Date  . AICD (automatic cardioverter/defibrillator) present   . Anemia   . Atrial fibrillation or flutter   . Cardiac arrest - ventricular fibrillation 12/10   with successful resucitation, S/p ICD  . CHF (congestive heart failure) ([place])   . Diverticula of colon 2011  . HTN (hypertension)    moderate  . ICD (implantable cardiac defibrillator) in place    she has received appropriate therapy for VF  . Internal and external hemorrhoids without complication [id number]  . Nonischemic cardiomyopathy ([place])    followed by Dr April Holding at [facility]  . Osteopenia   . Plasma cell disorder 03/20/2012  . Plasma cell disorder 03/20/2012  . Sessile colonic polyp 2011   Dr Benson [place]    Family History  Problem Relation Age of Onset  . Stroke Mother   . Stroke Brother     Past Surgical History:  Procedure Laterality Date  . ABDOMINAL HYSTERECTOMY    . CARDIAC CATHETERIZATION    . CARDIAC DEFIBRILLATOR PLACEMENT     by Greggory Brandy for secondary prevention of sudden death  . CARDIOVERSION N/A 15-Jul-2019   Procedure: CARDIOVERSION;  Surgeon: Jolaine Artist, MD;  Location: [facility] ENDOSCOPY;  Service: Cardiovascular;  Laterality: N/A;  . CHOLECYSTECTOMY N/A 10/09/2015   Procedure: LAPAROSCOPIC CHOLECYSTECTOMY;  Surgeon: Rolm Bookbinder, MD;  Location: [place];  Service: General;  Laterality: N/A;  . COLONOSCOPY W/ POLYPECTOMY  2011   Dr Benson Norway  . INSERTION OF IMPLANTABLE LEFT VENTRICULAR ASSIST DEVICE N/A 04/06/2013   Procedure: INSERTION OF IMPLANTABLE LEFT VENTRICULAR ASSIST DEVICE;  Surgeon: Gaye Pollack, MD;  Location: [place];  Service: Open Heart Surgery;  Laterality: N/A;  . INTRAOPERATIVE TRANSESOPHAGEAL ECHOCARDIOGRAM N/A 04/06/2013   Procedure: INTRAOPERATIVE TRANSESOPHAGEAL ECHOCARDIOGRAM;  Surgeon: Gaye Pollack, MD;  Location: [facility];   Service: Open Heart Surgery;  Laterality: N/A;  . IR GENERIC HISTORICAL  09/24/2016   IR US GUIDE VASC ACCESS RIGHT 09/24/2016 Greggory Keen, MD [facility]INTER[facility]  . IR GENERIC HISTORICAL  09/24/2016   IR ANGIOGRAM VISCERAL SELECTIVE 09/24/2016 Greggory Keen, MD [facility]  . IR GENERIC HISTORICAL  09/24/2016   IR ANGIOGRAM VISCERAL SELECTIVE 09/24/2016 Greggory Keen, MD [facility]  . PLACEMENT OF CENTRIMAG VENTRICULAR ASSIST DEVICE Right 04/06/2013   Procedure: PLACEMENT OF CENTRIMAG VENTRICULAR ASSIST DEVICE;  Surgeon: Gaye Pollack, MD;  Location: [facility];  Service: Open Heart Surgery;  Laterality: Right;   Social History   Occupational History  . Not on file  Tobacco Use  . Smoking status: Never Smoker  . Smokeless tobacco: Never Used  . Tobacco comment: remote history of tobacco abuse  Substance and Sexual Activity  . Alcohol use: Yes    Comment: occsionally wine  . Drug use: No  . Sexual activity:  Not Currently    Birth control/protection: Surgical

## 2019-09-19 NOTE — Progress Notes (Signed)
LVAD Clinic Note  Patient ID: Brianna Hanson, female   DOB: [birth year], 85 y.o.   MRN: [id number] Primary Heart Failure: Brianna Brianna Hanson Cardiac Surgeon: Brianna Brianna Hanson  HPI: Brianna Hanson is a 85 y.o. female with a PMH of HF due to severe NICM, chronic systolic HF,  PAF,  plasma cell disorder (Likely IgA MGUS) - followed by Brianna. Alen Hanson (follwoed q 6 months). Underwent implantation of the HeartMate II LVAD on 04/06/13 for DT. She is not on aspirin due to dizziness. S/P lap cholecystitis 10/2015.   GI Issues 09/2016 LGIB. Angio- no source of bleeding. LVAD speed turned down to 8400  LVAD Speed Change 12/2016 Ramp ECHO -->Speed increased from 8400>8800  Significant Events on VAD Support:  09/21/16 GIB, Ramp echo speed decreased to 8400  01/08/17 - LOW FLOW alarms; speed increased to 8800 RPM 05/20/17>speed decreased to 8600 06/18/19 - DC-CV of AF   Recently found to be in recurrent AF on device interrogation and amio increased to 200 bid. Underwent successful DC-CV on 06/18/19.  In 12/20 found to have severe L1 compression fracture. Saw Brianna. Lorin Hanson in Ortho. Says back back now improved but having severe left hip pain. Was seen by PCP who gave her narcotics but she had hallucinations. Now on tramadol but not getting much relief. No sciatic symptoms. Feels weak. No bowel/bladder incontinence. Poor appetite. Not drinking as much. Denies orthopnea or PND. No fevers, chills or problems with driveline. No bleeding, melena or neuro symptoms. Multiple PI events. No VAD alarms. Taking all meds as prescribed.    Here for routine f/u with Brianna Hanson:  She is here today for a sick visit due to worsening low back pain. She developed spontaneous acute LBP a week or 2 ago. I saw her and prescribed flexeril and analgesics. This did not help. She saw her PCP who gave her a narcotic and referred for a CT. She had ongoing pain so we had arranged for her to be seen by Brianna. Aline Hanson in Ortho but appt not for 2 week.. She  presents today with worsening pain. She is in [facility]. Very hard to walk or stand up. No frank weakness. No loss of sensation or bowel/bladder incontinence. No fevers or chills. No hematuria. Denies acute strain. Denies orthopnea or PND. No  problems with driveline. No bleeding, melena or neuro symptoms. No VAD alarms. Taking all meds as prescribed.    VAD Indication: Destination Therapy - age excluding and patient choice    VAD interrogation & Equipment Management: Speed: 8600 Flow: 4.0 Power: 4.6w PI: 6.5  Alarms: none Events: rare over last few days; 30 PI events on 10/23; 74 PI events on 10/22  Fixed speed 8600 Low speed limit: 8000  Primary Controller: Replace back up battery in 15 months. Back up controller: Replace back up battery in10 months.   Device:St Jude ICD Therapies: on at 200 bpm; monitor on 110 DDD 60 Last check: today: 07/01/19   Past Medical History:  Diagnosis Date   AICD (automatic cardioverter/defibrillator) present    Anemia    Atrial fibrillation or flutter    Cardiac arrest - ventricular fibrillation 12/10   with successful resucitation, S/p ICD   CHF (congestive heart failure) ([place])    Diverticula of colon 2011   HTN (hypertension)    moderate   ICD (implantable cardiac defibrillator) in place    she has received appropriate therapy for VF   Internal and external hemorrhoids without complication [id number]   Nonischemic  cardiomyopathy ([place])    [facility] by Brianna [facility]anna [facility] at [facility]   Osteopenia    Plasma cell disorder 03/20/2012   Plasma cell disorder 03/20/2012   Sessile colonic polyp 2011   Brianna Hanson    Current Outpatient Medications  Medication Sig Dispense Refill   amiodarone (PACERONE) 200 MG tablet Take 1 tablet (200 mg total) by mouth daily. 90 tablet 3   carvedilol (COREG) 6.25 MG tablet TAKE 1 TABLET BY MOUTH 2 TIMES DAILY WITH A MEAL 180 tablet 3   Cholecalciferol (VITAMIN D3) 25 MCG (1000 UT) CAPS Take 1,000 Units by  mouth daily.      docusate sodium (COLACE) 100 MG capsule Take 100 mg by mouth daily.     ferrous sulfate 325 (65 FE) MG tablet Take 1 tablet (325 mg total) by mouth 2 (two) times daily with a meal. 60 tablet 6   gabapentin (NEURONTIN) 100 MG capsule TAKE 1 CAPSULE BY MOUTH EVERYDAY AT BEDTIME 90 capsule 3   hydrALAZINE (APRESOLINE) 100 MG tablet TAKE 1 TABLET BY MOUTH 3 TIMES A DAY 270 tablet 9   pantoprazole sodium (PROTONIX) 40 mg/20 mL PACK Take 20 mg by mouth daily.     potassium chloride SA (KLOR-CON M20) 20 MEQ tablet Take 1 tablet (20 mEq total) by mouth 2 (two) times daily. 180 tablet 3   traMADol (ULTRAM) 50 MG tablet Take 2 tablets (100 mg total) by mouth every 12 (twelve) hours as needed for moderate pain. 60 tablet 3   warfarin (COUMADIN) 2.5 MG tablet TAKE 5 mg (2 tabs) by mouth every Monday/Friday and 2.5 mg (1 tab) all other days or as directed by HF Clinic 180 tablet 3   cyclobenzaprine (FLEXERIL) 5 MG tablet TAKE 1 TABLET BY MOUTH TWICE A DAY AS NEEDED FOR MUSCLE SPASM 60 tablet 0   furosemide (LASIX) 20 MG tablet Take 1 tablet (20 mg total) by mouth daily as needed (Only when recommended by CHF clinic.). 30 tablet 6   losartan (COZAAR) 100 MG tablet Take 1 tablet (100 mg total) by mouth daily. 90 tablet 3   predniSONE (DELTASONE) 10 MG tablet Take 20 mg daily for 4 days and then 10 mg daily x 3 days 18 tablet 0   No current facility-administered medications for this encounter.   Allergies  Allergen Reactions   Oxycodone Other (See Comments)    Hallucinations     Review of systems complete and found to be negative unless listed in HPI.    Vital Signs:  Doppler Pressure:  104 Automatc BP:  112/81 (93) HR: 73  SPO2:  96% on RA  Weight:  lb w/o eqt Last weight: 182.6 lbs  Exam: General:  Elderly woman sitting in WC> Fatigued and uncomfortable HEENT: normal  Neck: supple. JVP not elevated.  Carotids 2+ bilat; no bruits. No lymphadenopathy or  thryomegaly appreciated. Cor: LVAD hum.  Lungs: Clear. Abdomen: obese soft, nontender, non-distended. No hepatosplenomegaly. No bruits or masses. Good bowel sounds. Driveline site clean. Anchor in place.  Extremities: no cyanosis, clubbing, rash. Warm no edema  Neuro: alert & oriented x 3. No focal deficits. Moves all 4 without problem    ASSESSMENT AND PLAN:  1) Acute low back and left hip pain in setting of L1 compression fracture - back pain improved but now with severe left hip pain - not responding well to tramadol. Unable to tolerate narcotics - will try prednisone taper - Has f/u with PCP and Ortho. ? Need for injection. (if  so will need bridge off warfarin)   2) Chronic systolic HF: NICM, s/p ICD and LVAD for DT(04/2013).  - now 6+ years out.  - Stable. Typically NYHA I-II but now III-IIIB due to back/hip pain  - Volume status on low end due to poor po intake. Encouraged more fluid intae  3) LVAD placed for DT 04/2013:  - Failed Entresto due to diuresis and incessant low flow alarms.  - Continue losartan 100 mg daily.  - VAD interrogated personally. Now with more PI events due to poor intake from pain. MAPs on low end. Amlodipine recently stopped - No ASA with GI bleed and intolerance.  - Continue coumadin. No bleeding.  - LDH 266  - INR 2.1  4) PAF:  - Did not tolerate AF well at all.  - S/p DC-CV 06/18/19 - Remains in NSR on amio 200 daily.  - Continue warfarin  5) HTN:  - MAPs stable  6) Chronic Anticoagulation:  - As above.   - Goal INR 1.8-2.3.  - INR 2.1 Discussed dosing with PharmD personally. - Hgb stable 12.7  Total time spent 35 minutes. Over half that time spent discussing above.    Glori Bickers, MD 5:03 PM

## 2019-09-21 ENCOUNTER — Ambulatory Visit (HOSPITAL_COMMUNITY): Payer: Self-pay | Admitting: Pharmacist

## 2019-09-21 LAB — POCT INR: INR: 2.6 (ref 2.0–3.0)

## 2019-09-21 NOTE — Progress Notes (Signed)
LVAD INR 

## 2019-09-28 ENCOUNTER — Ambulatory Visit (HOSPITAL_COMMUNITY): Payer: Self-pay | Admitting: Pharmacist

## 2019-09-28 LAB — POCT INR: INR: 2.1 (ref 2.0–3.0)

## 2019-09-28 NOTE — Progress Notes (Signed)
LVAD INR 

## 2019-10-05 ENCOUNTER — Ambulatory Visit (HOSPITAL_COMMUNITY): Payer: Self-pay | Admitting: Pharmacist

## 2019-10-05 DIAGNOSIS — Z7901 Long term (current) use of anticoagulants: Secondary | ICD-10-CM | POA: Diagnosis not present

## 2019-10-05 LAB — POCT INR: INR: 2.2 (ref 2.0–3.0)

## 2019-10-05 NOTE — Progress Notes (Signed)
LVAD INR 

## 2019-10-12 ENCOUNTER — Ambulatory Visit (HOSPITAL_COMMUNITY): Payer: Self-pay | Admitting: Pharmacist

## 2019-10-12 LAB — POCT INR: INR: 2.6 (ref 2.0–3.0)

## 2019-10-12 NOTE — Progress Notes (Signed)
LVAD INR 

## 2019-10-14 ENCOUNTER — Other Ambulatory Visit (HOSPITAL_COMMUNITY): Payer: Self-pay | Admitting: Unknown Physician Specialty

## 2019-10-14 DIAGNOSIS — Z95811 Presence of heart assist device: Secondary | ICD-10-CM

## 2019-10-14 DIAGNOSIS — R52 Pain, unspecified: Secondary | ICD-10-CM

## 2019-10-14 MED ORDER — TRAMADOL HCL 50 MG PO TABS: 100.0000 mg | ORAL_TABLET | Freq: Two times a day (BID) | ORAL | 3 refills | Status: DC | PRN

## 2019-10-19 ENCOUNTER — Ambulatory Visit (HOSPITAL_COMMUNITY): Payer: Self-pay | Admitting: Pharmacist

## 2019-10-19 LAB — POCT INR: INR: 3.1 — AB (ref 2.0–3.0)

## 2019-10-19 NOTE — Progress Notes (Signed)
LVAD INR 

## 2019-10-21 ENCOUNTER — Telehealth (HOSPITAL_COMMUNITY): Payer: Self-pay | Admitting: Licensed Clinical Social Worker

## 2019-10-21 NOTE — Telephone Encounter (Signed)
CSW contacted patient/caregiver to follow up and remind of LVAD Support Group Zoom call on Monday October 25, 2019 @ 2:30pm. CSW offered support and reminders for covid safety. Message left.  CSW continues to follow though VAD Clinic for needs as identified.  Raquel Sarna, [place], [place]

## 2019-10-25 ENCOUNTER — Other Ambulatory Visit (HOSPITAL_COMMUNITY): Payer: Self-pay | Admitting: Unknown Physician Specialty

## 2019-10-25 ENCOUNTER — Ambulatory Visit (HOSPITAL_COMMUNITY): Payer: Self-pay | Admitting: Pharmacist

## 2019-10-25 ENCOUNTER — Encounter (HOSPITAL_COMMUNITY): Payer: Self-pay

## 2019-10-25 ENCOUNTER — Ambulatory Visit (HOSPITAL_COMMUNITY)
Admission: RE | Admit: 2019-10-25 | Discharge: 2019-10-25 | Disposition: A | Discharge status: Home / Self Care | Payer: Medicare Other | Source: Ambulatory Visit | Attending: Cardiology | Admitting: Cardiology

## 2019-10-25 ENCOUNTER — Other Ambulatory Visit: Payer: Self-pay

## 2019-10-25 VITALS — BP 103/77 | HR 87 | Temp 97.5°F | Ht 60.0 in | Wt 166.8 lb

## 2019-10-25 DIAGNOSIS — I5022 Chronic systolic (congestive) heart failure: Secondary | ICD-10-CM | POA: Diagnosis not present

## 2019-10-25 DIAGNOSIS — Z7901 Long term (current) use of anticoagulants: Secondary | ICD-10-CM | POA: Diagnosis not present

## 2019-10-25 DIAGNOSIS — M160 Bilateral primary osteoarthritis of hip: Secondary | ICD-10-CM | POA: Diagnosis not present

## 2019-10-25 DIAGNOSIS — M8000XG Age-related osteoporosis with current pathological fracture, unspecified site, subsequent encounter for fracture with delayed healing: Secondary | ICD-10-CM | POA: Diagnosis not present

## 2019-10-25 DIAGNOSIS — Z95811 Presence of heart assist device: Secondary | ICD-10-CM

## 2019-10-25 DIAGNOSIS — S32000G Wedge compression fracture of unspecified lumbar vertebra, subsequent encounter for fracture with delayed healing: Secondary | ICD-10-CM | POA: Diagnosis not present

## 2019-10-25 LAB — BASIC METABOLIC PANEL
Anion gap: 13 (ref 5–15)
BUN: 10 mg/dL (ref 8–23)
CO2: 20 mmol/L — ABNORMAL LOW (ref 22–32)
Calcium: 9.6 mg/dL (ref 8.9–10.3)
Chloride: 105 mmol/L (ref 98–111)
Creatinine, Ser: 1.12 mg/dL — ABNORMAL HIGH (ref 0.44–1.00)
GFR calc Af Amer: 53 mL/min — ABNORMAL LOW (ref >60)
GFR calc non Af Amer: 46 mL/min — ABNORMAL LOW (ref >60)
Glucose, Bld: 107 mg/dL — ABNORMAL HIGH (ref 70–99)
Potassium: 4.4 mmol/L (ref 3.5–5.1)
Sodium: 138 mmol/L (ref 135–145)

## 2019-10-25 LAB — CBC
HCT: 37.7 % (ref 36.0–46.0)
Hemoglobin: 11.7 g/dL — ABNORMAL LOW (ref 12.0–15.0)
MCH: 31.3 pg (ref 26.0–34.0)
MCHC: 31 g/dL (ref 30.0–36.0)
MCV: 100.8 fL — ABNORMAL HIGH (ref 80.0–100.0)
Platelets: 253 10*3/uL (ref 150–400)
RBC: 3.74 MIL/uL — ABNORMAL LOW (ref 3.87–5.11)
RDW: 15.6 % — ABNORMAL HIGH (ref 11.5–15.5)
WBC: 6 10*3/uL (ref 4.0–10.5)
nRBC: 0 % (ref 0.0–0.2)

## 2019-10-25 LAB — PROTIME-INR
INR: 2.3 — ABNORMAL HIGH (ref 0.8–1.2)
Prothrombin Time: 25.1 seconds — ABNORMAL HIGH (ref 11.4–15.2)

## 2019-10-25 LAB — TYPE AND SCREEN
ABO/RH(D): O POS
Antibody Screen: NEGATIVE

## 2019-10-25 LAB — LACTATE DEHYDROGENASE: LDH: 170 U/L (ref 98–192)

## 2019-10-25 MED ORDER — GABAPENTIN 100 MG PO CAPS: 300.0000 mg | ORAL_CAPSULE | Freq: Two times a day (BID) | ORAL | 3 refills | Status: AC

## 2019-10-25 NOTE — Progress Notes (Signed)
Patient presents for sick visit today in [facility] today with caregiver, [name].  Reports no problems with VAD equipment or concerns with drive line.  Patient called VAD office this am to report bright red bloody stools starting Friday, 10/22/19.   Pt arrived in transport chair. Pt says her back/hip pain started getting worse @ 2 weeks ago. Both report she cannot take oxy, and the tramadol does not completely relieve the pain. Will increase gabapentin to 200 twice daily.   Pt has f/u with Dr. Lorin Mercy (ortho) Wednesday, 10/27/19. Dr. Vaughan Browner updated and will contact Dr. Lorin Mercy.    Pt reports she has had increased # of Low Flow alarms over last two weeks as well. Reports occasional orthostatic dizziness. But due to increased pain level over last two weeks, has not been getting up and around as much. Pt wt down 12 lbs, she says her appetite is poor. Asked her to supplement diet with protein drinks/shakes. [name] says he will get some for her.   Vital Signs:  Temp: 97.5 Doppler Pressure:  116 Automatc BP:  103/77 (88) HR: 87 SPO2:  96% on RA  Weight: 166.8 lb w/o eqt Last weight: 179 lbs  VAD Indication: Destination Therapy - age excluding and patient choice    VAD interrogation & Equipment Management: Speed: 8600 Flow: 3.3 Power: 4.0w    PI: 6.5  Alarms: 25 LF on 2/21; 17 LF on 2/20; 22 LF on 2/19; 9 LF on 2/18 Events: 20 - 40 PI events daily  Fixed speed 8600 Low speed limit: 8000  Primary Controller: Replace back up battery in 11 months. Back up controller: Replace back up battery in 7 months.  Exit Site Care: Drive line is being maintained weekly by [employer]. Drive line exit site well healed and incorporated. The velour is fully implanted at exit site. Dressing dry and intact. No erythema or drainage. Stabilization device present and accurately applied. Pt denies fever or chills. Pt has adequate dressing supplies at home.  Significant Events on VAD Support:  09/21/16 GIB, Ramp  echo speed decreased to 8400  01/08/17 - LOW FLOW alarms; speed increased to 8800 RPM 05/20/17> speed decreased to 8600  Device:St Jude ICD Therapies: on at 200 bpm; monitor on 110 DDD 60 Last check: today: 07/01/19  BP & Labs: Doppler BP 116 -  is reflecting modified systolic  Hgb 123456 - Reports bright red bleeding with stools starting Friday, 10/22/19.  LDH pending - established range is 150 - 300. Denies any dark urine.  Patient Instructions: 1. May increase gabapentin to 200 mg twice daily for pain. 2. Return to [place] [facility] in one month with 6.5 yr In[facility] f/u.   Zada Girt RN [place] Coordinator  Office: [phone]  24/7 Pager: [phone]

## 2019-10-25 NOTE — Progress Notes (Signed)
LVAD Clinic Note  Patient ID: Brianna Hanson, female   DOB: 1934-11-16, 85 y.o.   MRN: [id number] Primary Heart Failure: Dr Haroldine Laws Cardiac Surgeon: Dr Prescott Gum  HPI: Brianna Hanson is a 85 y.o. 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 [name] (follwoed q 6 months). Underwent implantation of the HeartMate II LVAD on 04/06/13 for DT. She is not on aspirin due to dizziness. S/P lap cholecystitis 10/2015.   GI Issues 09/2016 LGIB. Angio- no source of bleeding. LVAD speed turned down to 8400  LVAD Speed Change 12/2016 Ramp ECHO -->Speed increased from 8400>8800  Significant Events on VAD Support:  09/21/16 GIB, Ramp echo speed decreased to 8400  01/08/17 - LOW FLOW alarms; speed increased to 8800 RPM 05/20/17>speed decreased to 8600 06/18/19 - DC-CV of AF   Recently found to be in recurrent AF on device interrogation and amio increased to 200 bid. Underwent successful DC-CV on 06/18/19.  In 12/20 found to have severe L1 compression fracture. Saw Dr. Lorin Mercy in Ortho. Says back back now improved but having severe left hip pain. Was seen by PCP who gave her narcotics but she had hallucinations. Now on tramadol but not getting much relief. No sciatic symptoms. Feels weak. No bowel/bladder incontinence. Poor appetite. Not drinking as much. Denies orthopnea or PND. No fevers, chills or problems with driveline. No bleeding, melena or neuro symptoms. Multiple PI events. No VAD alarms. Taking all meds as prescribed.    She presents for sick visit due to bright red blood in stool. Had 1-2 BMs with primarily a gush of red blood. No pain or fevers. No melena. Now resolved. Also complaining of worsening low back and hip pain. Hard for her to get around. No bowel or bladder incontinence. She cannot take oxy, and the tramadol does not completely relieve the pain. Will increase gabapentin to 200 twice daily.  Pt has f/u with  Dr. Lorin Mercy (ortho) Wednesday, 10/27/19. Says she is not eating or drinking as well due to the pain. Weight down 12 pounds. Denies orthopnea or PND. No fevers, chills or problems with driveline. No VAD alarms. Taking all meds as prescribed.   VAD Indication: Destination Therapy - age excluding and patient choice    VAD interrogation & Equipment Management: Speed: 8600 Flow: 3.3 Power: 4.0w PI: 6.5  Alarms: 25 LF on 2/21; 17 LF on 2/20; 22 LF on 2/19; 9 LF on 2/18 Events: 20 - 40 PI events daily  Fixed speed 8600 Low speed limit: 8000  Primary Controller: Replace back up battery in 11 months. Back up controller: Replace back up battery in7 months..   Device:St Jude ICD Therapies: on at 200 bpm; monitor on 110 DDD 60 Last check: today: 07/01/19   Past Medical History:  Diagnosis Date  . AICD (automatic cardioverter/defibrillator) present   . Anemia   . Atrial fibrillation or flutter   . Cardiac arrest - ventricular fibrillation 12/10   with successful resucitation, S/p ICD  . CHF (congestive heart failure) ([place])   . Diverticula of colon 2011  . HTN (hypertension)    moderate  . ICD (implantable cardiac defibrillator) in place    she has received appropriate therapy for VF  . Internal and external hemorrhoids without complication [id number]  . Nonischemic cardiomyopathy ([place])    followed by Dr April Holding at [facility]  . Osteopenia   . Plasma cell  disorder 03/20/2012  . Plasma cell disorder 03/20/2012  . Sessile colonic polyp 2011   Dr Benson Norway    Current Outpatient Medications  Medication Sig Dispense Refill  . amiodarone (PACERONE) 200 MG tablet Take 1 tablet (200 mg total) by mouth daily. 90 tablet 3  . carvedilol (COREG) 6.25 MG tablet TAKE 1 TABLET BY MOUTH 2 TIMES DAILY WITH A MEAL 180 tablet 3  . Cholecalciferol (VITAMIN D3) 25 MCG (1000 UT) CAPS Take 1,000 Units by mouth daily.     [name] docusate sodium (COLACE) 100 MG capsule Take 100 mg by mouth daily.    . ferrous  sulfate 325 (65 FE) MG tablet Take 1 tablet (325 mg total) by mouth 2 (two) times daily with a meal. 60 tablet 6  . gabapentin (NEURONTIN) 100 MG capsule Take 3 capsules (300 mg total) by mouth 2 (two) times daily. 180 capsule 3  . hydrALAZINE (APRESOLINE) 100 MG tablet TAKE 1 TABLET BY MOUTH 3 TIMES A DAY 270 tablet 9  . pantoprazole sodium (PROTONIX) 40 mg/20 mL PACK Take 20 mg by mouth daily.    . potassium chloride SA (KLOR-CON M20) 20 MEQ tablet Take 1 tablet (20 mEq total) by mouth 2 (two) times daily. 180 tablet 3  . traMADol (ULTRAM) 50 MG tablet Take 2 tablets (100 mg total) by mouth every 12 (twelve) hours as needed for moderate pain. 60 tablet 3  . warfarin (COUMADIN) 2.5 MG tablet TAKE 5 mg (2 tabs) by mouth every Monday/Friday and 2.5 mg (1 tab) all other days or as directed by HF Clinic 180 tablet 3  . cyclobenzaprine (FLEXERIL) 5 MG tablet TAKE 1 TABLET BY MOUTH TWICE A DAY AS NEEDED FOR MUSCLE SPASM (Patient not taking: Reported on 10/25/2019) 60 tablet 0  . furosemide (LASIX) 20 MG tablet Take 1 tablet (20 mg total) by mouth daily as needed (Only when recommended by CHF clinic.). (Patient not taking: Reported on 10/25/2019) 30 tablet 6  . losartan (COZAAR) 100 MG tablet Take 1 tablet (100 mg total) by mouth daily. 90 tablet 3   No current facility-administered medications for this encounter.   Allergies  Allergen Reactions  . Oxycodone Other (See Comments)    Hallucinations     Review of systems complete and found to be negative unless listed in HPI.    Vital Signs:  Temp: 97.5 Doppler Pressure:  116 Automatc BP:  103/77 (88) HR: 87 SPO2:  96% on RA  Weight: 166.8 lb w/o eqt Last weight: 179 lbs   Exam: General:  Elderly woman. Weak appearing HEENT: normal  Neck: supple. JVP not elevated.  Carotids 2+ bilat; no bruits. No lymphadenopathy or thryomegaly appreciated. Cor: LVAD hum.  Lungs: Clear. Abdomen: soft, nontender, non-distended. No hepatosplenomegaly. No  bruits or masses. Good bowel sounds. Driveline site clean. Anchor in place.  Extremities: no cyanosis, clubbing, rash. Warm no edema  Neuro: alert & oriented x 3. No focal deficits. Moves all 4 without problem    ASSESSMENT AND PLAN:  1) BRBPR - suspect diverticulosis. Had episode in 09/2016 - 12/2009 Colonoscopy, Dr Benson Norway: descending adenoma and pan-diverticulosis > in sigmoid, int/ext hemorrhoids.  - hgb 12.7 -> 11.7 - now seems to have resolved  - we have decided not to pursue repeat colonoscopy at this time given age and comorbidities (particualrly severe back/hip issues). If bleeding persists can revisit  2) Low back and left hip pain in setting of L1 compression fracture - now worse - not responding well to  tramadol. Unable to tolerate narcotics - had some response to steroids but not much - Sees Dr. Lorin Mercy this week to discuss possible vertebroplasty  3) Chronic systolic HF: NICM, s/p ICD and LVAD for DT(04/2013).  - now 6+ years out on VAD support - Stable. Typically NYHA I-II but now IIIB-IV due to back/hip pain  - Volume status on low end due to poor po intake. Encouraged more po intake  4) LVAD placed for DT 04/2013:  - Failed Entresto due to diuresis and incessant low flow alarms.  - Continue losartan 100 mg daily.  - VAD interrogated personally. Continues with frequene PI events due to poor intake from pain. More po intake - No ASA with GI bleed and intolerance.  - Continue coumadin. No bleeding.  - LDH 170 - INR 2.3 Discussed dosing with PharmD personally.  5) PAF:  - Did not tolerate AF well at all.  - S/p DC-CV 06/18/19 - Remains in NSR on amio 200 daily.  - Continue warfarin - no changes  5) HTN:  - MAPs stable  6) Chronic Anticoagulation:  - As above.   - Goal INR 1.8-2.3.  - INR 2.3 Discussed dosing with PharmD personally. - Hgb down slightly in setting of diverticualr bleed. Will follow  Total time spent 480minutes. Over half that time spent discussing  above.    Glori Bickers, MD 11:21 AM

## 2019-10-25 NOTE — Progress Notes (Signed)
LVAD INR 

## 2019-10-27 ENCOUNTER — Encounter: Payer: Self-pay | Admitting: Orthopaedic Surgery

## 2019-10-27 ENCOUNTER — Ambulatory Visit (INDEPENDENT_AMBULATORY_CARE_PROVIDER_SITE_OTHER): Payer: Medicare Other

## 2019-10-27 ENCOUNTER — Other Ambulatory Visit: Payer: Self-pay

## 2019-10-27 ENCOUNTER — Ambulatory Visit (INDEPENDENT_AMBULATORY_CARE_PROVIDER_SITE_OTHER): Payer: Medicare Other | Admitting: Orthopaedic Surgery

## 2019-10-27 VITALS — Ht 60.0 in | Wt 166.0 lb

## 2019-10-27 DIAGNOSIS — S32010A Wedge compression fracture of first lumbar vertebra, initial encounter for closed fracture: Secondary | ICD-10-CM | POA: Diagnosis not present

## 2019-10-27 DIAGNOSIS — S32010D Wedge compression fracture of first lumbar vertebra, subsequent encounter for fracture with routine healing: Secondary | ICD-10-CM | POA: Diagnosis not present

## 2019-10-27 NOTE — Progress Notes (Signed)
Office Visit Note   Patient: Brianna Hanson           Date of Birth: 1935-03-04           MRN: [id number] Visit Date: 10/27/2019              Requested by: Janith Lima, MD [address] [place],  [place] [address] PCP: Janith Lima, MD   Assessment & Plan: Visit Diagnoses:  1. Compression fracture of L1 vertebra with routine healing, subsequent encounter   2. Closed wedge compression fracture of L1 vertebra, initial encounter ([place])     Plan: She can continue to walk with a walker sit and rest when she gets tired.  Previous CT scan showed no significant stenosis there was minimal retropulsion.  She has had no further compression and I plan to recheck her in 63month.  Single spot lateral x-ray on return.  Follow-Up Instructions: Return in about 1 month (around 11/24/2019).   Orders:  Orders Placed This Encounter  Procedures  . XR Lumbar Spine 2-3 Views   No orders of the defined types were placed in this encounter.     Procedures: No procedures performed   Clinical Data: No additional findings.   Subjective: Chief Complaint  Patient presents with  . Lower Back - Fracture, Follow-up    HPI 85 year old female with heart failure and ventricular assist device seen for follow-up of L1 compression fracture.  She states she has been walking a little bit more and had been using the walker and her pain seems like it is gotten worse she was taking some Ultram but did not feel like it took her pain away so she decided to stop it she still has refills available.  She has less pain when she is sitting she uses the low pillow behind her back.  She states she did better with the Rollator walker than folding walker with 5 inch front wheels.  Review of Systems 14 point system update unchanged from 09/15/2019 office visit other than mentioned in HPI.   Objective: Vital Signs: Ht 5' (1.524 m)   Wt 166 lb (75.3 kg)   BMI 32.42 kg/m   Physical Exam Constitutional:    Appearance: She is well-developed.     Comments: Frail appearance.  Patient in wheelchair with ventricular assist device and battery pack at her waist.  HENT:     Head: Normocephalic.     Right Ear: External ear normal.     Left Ear: External ear normal.  Eyes:     Pupils: Pupils are equal, round, and reactive to light.  Neck:     Thyroid: No thyromegaly.     Trachea: No tracheal deviation.  Cardiovascular:     Rate and Rhythm: Normal rate.  Pulmonary:     Effort: Pulmonary effort is normal.  Abdominal:     Palpations: Abdomen is soft.  Skin:    General: Skin is warm and dry.  Neurological:     Mental Status: She is alert and oriented to person, place, and time.  Psychiatric:        Behavior: Behavior normal.     Ortho Exam patient has intact sensation over her thighs.  Intact ankle dorsiflexion plantarflexion.  Specialty Comments:  No specialty comments available.  Imaging: No results found.   PMFS History: Patient Active Problem List   Diagnosis Date Noted  . Primary osteoarthritis of both hips 09/07/2019  . Acute hip pain, bilateral 09/06/2019  . Osteoporosis  with pathological fracture 09/06/2019  . Lumbar compression fracture ([place]) 08/13/2019  . Acute left-sided low back pain with left-sided sciatica 08/04/2019  . Closed wedge compression fracture of first lumbar vertebra ([place]) 08/04/2019  . Hyperlipidemia LDL goal <130 03/04/2018  . Eczema of lower extremity 03/04/2018  . Diverticulosis of colon with hemorrhage   . Primary osteoarthritis of left knee 09/05/2016  . Iron deficiency anemia 07/11/2015  . Chronic anticoagulation 11/24/2013  . Chronic systolic congestive heart failure ([place]) 05/01/2013  . LVAD (left ventricular assist device) present ([place]) 04/07/2013  . Right heart failure ([place]) 03/29/2013  . PAH (pulmonary artery hypertension) ([place]) 03/29/2013  . Plasma cell disorder 03/20/2012  . Long term current use of anticoagulant therapy 02/14/2012  .  Other abnormal glucose 02/03/2012  . Chronic renal insufficiency, stage II (mild) 02/03/2012  . Atrial fibrillation ([place]) 11/16/2010  . Essential hypertension, benign 05/13/2007  . CARDIOMYOPATHY, PRIMARY NEC 05/13/2007  . OSTEOPENIA 05/13/2007   Past Medical History:  Diagnosis Date  . AICD (automatic cardioverter/defibrillator) present   . Anemia   . Atrial fibrillation or flutter   . Cardiac arrest - ventricular fibrillation 12/10   with successful resucitation, S/p ICD  . CHF (congestive heart failure) ([place])   . Diverticula of colon 2011  . HTN (hypertension)    moderate  . ICD (implantable cardiac defibrillator) in place    she has received appropriate therapy for VF  . Internal and external hemorrhoids without complication [id number]  . Nonischemic cardiomyopathy ([place])    followed by Dr April Holding at [facility]  . Osteopenia   . Plasma cell disorder 03/20/2012  . Plasma cell disorder 03/20/2012  . Sessile colonic polyp 2011   Dr Benson Norway    Family History  Problem Relation Age of Onset  . Stroke Mother   . Stroke Brother     Past Surgical History:  Procedure Laterality Date  . ABDOMINAL HYSTERECTOMY    . CARDIAC CATHETERIZATION    . CARDIAC DEFIBRILLATOR PLACEMENT     by Greggory Brandy for secondary prevention of sudden death  . CARDIOVERSION N/A 07-13-2019   Procedure: CARDIOVERSION;  Surgeon: Jolaine Artist, MD;  Location: [facility] ENDOSCOPY;  Service: Cardiovascular;  Laterality: N/A;  . CHOLECYSTECTOMY N/A 10/09/2015   Procedure: LAPAROSCOPIC CHOLECYSTECTOMY;  Surgeon: Rolm Bookbinder, MD;  Location: [facility];  Service: General;  Laterality: N/A;  . COLONOSCOPY W/ POLYPECTOMY  2011   Dr Benson Norway  . INSERTION OF IMPLANTABLE LEFT VENTRICULAR ASSIST DEVICE N/A 04/06/2013   Procedure: INSERTION OF IMPLANTABLE LEFT VENTRICULAR ASSIST DEVICE;  Surgeon: Gaye Pollack, MD;  Location: [place];  Service: Open Heart Surgery;  Laterality: N/A;  . INTRAOPERATIVE TRANSESOPHAGEAL ECHOCARDIOGRAM N/A 04/06/2013    Procedure: INTRAOPERATIVE TRANSESOPHAGEAL ECHOCARDIOGRAM;  Surgeon: Gaye Pollack, MD;  Location: [facility] OR;  Service: Open Heart Surgery;  Laterality: N/A;  . IR GENERIC HISTORICAL  09/24/2016   IR US GUIDE VASC ACCESS RIGHT 09/24/2016 Greggory Keen, MD [facility]INTER[facility] RAD  . IR GENERIC HISTORICAL  09/24/2016   IR ANGIOGRAM VISCERAL SELECTIVE 09/24/2016 Greggory Keen, MD [facility]  . IR GENERIC HISTORICAL  09/24/2016   IR ANGIOGRAM VISCERAL SELECTIVE 09/24/2016 Greggory Keen, MD [facility]  . PLACEMENT OF CENTRIMAG VENTRICULAR ASSIST DEVICE Right 04/06/2013   Procedure: PLACEMENT OF CENTRIMAG VENTRICULAR ASSIST DEVICE;  Surgeon: Gaye Pollack, MD;  Location: [facility];  Service: Open Heart Surgery;  Laterality: Right;   Social History   Occupational History  . Not on file  Tobacco Use  . Smoking  status: Never Smoker  . Smokeless tobacco: Never Used  . Tobacco comment: remote history of tobacco abuse  Substance and Sexual Activity  . Alcohol use: Yes    Comment: occsionally wine  . Drug use: No  . Sexual activity: Not Currently    Birth control/protection: Surgical

## 2019-11-02 ENCOUNTER — Ambulatory Visit (INDEPENDENT_AMBULATORY_CARE_PROVIDER_SITE_OTHER): Payer: Medicare Other | Admitting: *Deleted

## 2019-11-02 ENCOUNTER — Ambulatory Visit (HOSPITAL_COMMUNITY): Payer: Self-pay | Admitting: Pharmacist

## 2019-11-02 DIAGNOSIS — I5022 Chronic systolic (congestive) heart failure: Secondary | ICD-10-CM

## 2019-11-02 DIAGNOSIS — Z7901 Long term (current) use of anticoagulants: Secondary | ICD-10-CM | POA: Diagnosis not present

## 2019-11-02 LAB — CUP PACEART REMOTE DEVICE CHECK
Battery Remaining Longevity: 3 mo
Battery Remaining Percentage: 3 %
Battery Voltage: 2.6 V
Brady Statistic AP VP Percent: 1 %
Brady Statistic AP VS Percent: 8.3 %
Brady Statistic AS VP Percent: 1 %
Brady Statistic AS VS Percent: 90 %
Brady Statistic RA Percent Paced: 6.4 %
Brady Statistic RV Percent Paced: 1 %
Date Time Interrogation Session: 20210302071841
HighPow Impedance: 45 Ohm
Implantable Lead Implant Date: 20110104
Implantable Lead Implant Date: 20110104
Implantable Lead Location: 753859
Implantable Lead Location: 753860
Implantable Lead Model: 7121
Implantable Pulse Generator Implant Date: 20110104
Lead Channel Impedance Value: 300 Ohm
Lead Channel Impedance Value: 310 Ohm
Lead Channel Pacing Threshold Amplitude: 0.75 V
Lead Channel Pacing Threshold Amplitude: 0.75 V
Lead Channel Pacing Threshold Pulse Width: 0.5 ms
Lead Channel Pacing Threshold Pulse Width: 0.5 ms
Lead Channel Sensing Intrinsic Amplitude: 0.7 mV
Lead Channel Sensing Intrinsic Amplitude: 11.4 mV
Lead Channel Setting Pacing Amplitude: 2.5 V
Lead Channel Setting Pacing Amplitude: 2.5 V
Lead Channel Setting Pacing Pulse Width: 0.5 ms
Lead Channel Setting Sensing Sensitivity: 0.5 mV
Pulse Gen Serial Number: 754815

## 2019-11-02 LAB — POCT INR: INR: 2.3 (ref 2.0–3.0)

## 2019-11-02 NOTE — Progress Notes (Signed)
LVAD INR 

## 2019-11-03 ENCOUNTER — Telehealth: Payer: Self-pay | Admitting: Orthopaedic Surgery

## 2019-11-03 NOTE — Telephone Encounter (Signed)
Patient called advised she is having pain in both of her hips and want to know if she can get something prescribed for the pain? The number to contact patient is [phone]

## 2019-11-03 NOTE — Telephone Encounter (Signed)
Please advise 

## 2019-11-03 NOTE — Telephone Encounter (Signed)
I called pt,  she had tramadol ordered # 60 with 3 refills.  FYI

## 2019-11-03 NOTE — Telephone Encounter (Signed)
noted 

## 2019-11-03 NOTE — Progress Notes (Signed)
ICD Remote  

## 2019-11-04 ENCOUNTER — Telehealth: Payer: Self-pay | Admitting: Emergency Medicine

## 2019-11-04 NOTE — Telephone Encounter (Signed)
Patient made aware that [facility] ICD battery has reached ERI. Patient reminded of appointment with Dr Rayann Heman 11/15/19 and informed that the plan for replacement would be discussed at that time.

## 2019-11-09 ENCOUNTER — Ambulatory Visit (HOSPITAL_COMMUNITY): Payer: Self-pay | Admitting: Pharmacist

## 2019-11-09 LAB — POCT INR: INR: 2.8 (ref 2.0–3.0)

## 2019-11-09 NOTE — Progress Notes (Signed)
LVAD INR 

## 2019-11-11 ENCOUNTER — Other Ambulatory Visit (HOSPITAL_COMMUNITY): Payer: Self-pay | Admitting: Internal Medicine

## 2019-11-15 ENCOUNTER — Telehealth: Payer: Self-pay | Admitting: *Deleted

## 2019-11-15 ENCOUNTER — Other Ambulatory Visit (HOSPITAL_COMMUNITY): Payer: Self-pay | Admitting: *Deleted

## 2019-11-15 ENCOUNTER — Other Ambulatory Visit: Payer: Self-pay

## 2019-11-15 ENCOUNTER — Telehealth (INDEPENDENT_AMBULATORY_CARE_PROVIDER_SITE_OTHER): Payer: Medicare Other | Admitting: Internal Medicine

## 2019-11-15 DIAGNOSIS — Z7901 Long term (current) use of anticoagulants: Secondary | ICD-10-CM

## 2019-11-15 DIAGNOSIS — I428 Other cardiomyopathies: Secondary | ICD-10-CM

## 2019-11-15 DIAGNOSIS — I4891 Unspecified atrial fibrillation: Secondary | ICD-10-CM | POA: Diagnosis not present

## 2019-11-15 DIAGNOSIS — I480 Paroxysmal atrial fibrillation: Secondary | ICD-10-CM

## 2019-11-15 DIAGNOSIS — Z95811 Presence of heart assist device: Secondary | ICD-10-CM

## 2019-11-15 DIAGNOSIS — Z79899 Other long term (current) drug therapy: Secondary | ICD-10-CM

## 2019-11-15 DIAGNOSIS — I5022 Chronic systolic (congestive) heart failure: Secondary | ICD-10-CM

## 2019-11-15 DIAGNOSIS — D6869 Other thrombophilia: Secondary | ICD-10-CM

## 2019-11-15 DIAGNOSIS — Z95810 Presence of automatic (implantable) cardiac defibrillator: Secondary | ICD-10-CM | POA: Diagnosis not present

## 2019-11-15 NOTE — Telephone Encounter (Signed)
Spoke with patient. She is agreeable to appointment on 11/30/19 to turn off all patient ICD alerts due to known ERI status with no plan for generator replacement. Will also discontinue [facility] monitoring/remote appointments at that time.  Confirmed ICD EOS behavior with Lara, [place] representative. At EOS/complete depletion, sometimes device will enter backup mode, could pace sub-threshold and therapy zone would change to single VF zone at 187bpm for 8 intervals (without enough energy to generate a shock). Recommendation is to turn off patient alerts. Clinic will not be alerted via [place] when EOS is reached.

## 2019-11-15 NOTE — Progress Notes (Signed)
Electrophysiology TeleHealth Note  Due to national recommendations of social distancing due to Brianna Hanson, an audio telehealth visit is felt to be most appropriate for this patient at this time.  Verbal consent was obtained by me for the telehealth visit today.  The patient does not have capability for a virtual visit.  A phone visit is therefore required today.   Date:  11/15/2019   ID:  Brianna Hanson, DOB February 05, 1935, MRN [id number]  Location: patient's home  Provider location:  [place]  Evaluation Performed: Follow-up visit  PCP:  Janith Lima, MD   Electrophysiologist:  Dr Rayann Heman  Chief Complaint:  palpitations  History of Present Illness:    Brianna Hanson is a 85 y.o. female who presents via telehealth conferencing today.  Since last being seen in our clinic, the patient reports doing reasonably well.  Her primary concern are with hip and lower back pain.  She is not very active because of this. Today, she denies symptoms of palpitations, chest pain, shortness of breath,  lower extremity edema, dizziness, presyncope, or syncope.   + recent issues with GI bleeding. The patient is otherwise without complaint today.    Past Medical History:  Diagnosis Date  . AICD (automatic cardioverter/defibrillator) present   . Anemia   . Atrial fibrillation or flutter   . Cardiac arrest - ventricular fibrillation 12/10   with successful resucitation, S/p ICD  . CHF (congestive heart failure) ([place])   . Diverticula of colon 2011  . HTN (hypertension)    moderate  . ICD (implantable cardiac defibrillator) in place    she has received appropriate therapy for VF  . Internal and external hemorrhoids without complication [id number]  . Nonischemic cardiomyopathy ([place])    followed by Dr April Holding at [facility]  . Osteopenia   . Plasma cell disorder 7/Hanson/2013  . Plasma cell disorder 7/Hanson/2013  . Sessile colonic polyp 2011   Dr Benson Norway    Past Surgical History:  Procedure Laterality Date  .  ABDOMINAL HYSTERECTOMY    . CARDIAC CATHETERIZATION    . CARDIAC DEFIBRILLATOR PLACEMENT     by Greggory Brandy for secondary prevention of sudden death  . CARDIOVERSION N/A 07-18-19   Procedure: CARDIOVERSION;  Surgeon: Jolaine Artist, MD;  Location: [facility] ENDOSCOPY;  Service: Cardiovascular;  Laterality: N/A;  . CHOLECYSTECTOMY N/A 10/09/2015   Procedure: LAPAROSCOPIC CHOLECYSTECTOMY;  Surgeon: Rolm Bookbinder, MD;  Location: [place];  Service: General;  Laterality: N/A;  . COLONOSCOPY W/ POLYPECTOMY  2011   Dr Benson Norway  . INSERTION OF IMPLANTABLE LEFT VENTRICULAR ASSIST DEVICE N/A 04/06/2013   Procedure: INSERTION OF IMPLANTABLE LEFT VENTRICULAR ASSIST DEVICE;  Surgeon: Gaye Pollack, MD;  Location: [place];  Service: Open Heart Surgery;  Laterality: N/A;  . INTRAOPERATIVE TRANSESOPHAGEAL ECHOCARDIOGRAM N/A 04/06/2013   Procedure: INTRAOPERATIVE TRANSESOPHAGEAL ECHOCARDIOGRAM;  Surgeon: Gaye Pollack, MD;  Location: [place] OR;  Service: Open Heart Surgery;  Laterality: N/A;  . IR GENERIC HISTORICAL  09/24/2016   IR US GUIDE VASC ACCESS RIGHT 09/24/2016 Greggory Keen, MD [facility]-INTER[facility] RAD  . IR GENERIC HISTORICAL  09/24/2016   IR ANGIOGRAM VISCERAL SELECTIVE 09/24/2016 Greggory Keen, MD [facility]  . IR GENERIC HISTORICAL  09/24/2016   IR ANGIOGRAM VISCERAL SELECTIVE 09/24/2016 Greggory Keen, MD [facility]  . PLACEMENT OF CENTRIMAG VENTRICULAR ASSIST DEVICE Right 04/06/2013   Procedure: PLACEMENT OF CENTRIMAG VENTRICULAR ASSIST DEVICE;  Surgeon: Gaye Pollack, MD;  Location: [place];  Service: Open Heart Surgery;  Laterality: Right;    Current Outpatient Medications  Medication Sig Dispense Refill  . amiodarone (PACERONE) 200 MG tablet Take 1 tablet (200 mg total) by mouth daily. 90 tablet 3  . carvedilol (COREG) 6.25 MG tablet TAKE 1 TABLET BY MOUTH 2 TIMES DAILY WITH A MEAL 180 tablet 3  . Cholecalciferol (VITAMIN D3) 25 MCG (1000 UT) CAPS Take 1,000 Units by mouth daily.     . cyclobenzaprine (FLEXERIL) 5 MG  tablet TAKE 1 TABLET BY MOUTH TWICE A DAY AS NEEDED FOR MUSCLE SPASM (Patient not taking: Reported on 10/25/2019) 60 tablet 0  . docusate sodium (COLACE) 100 MG capsule Take 100 mg by mouth daily.    . ferrous sulfate 325 (65 FE) MG tablet Take 1 tablet (325 mg total) by mouth 2 (two) times daily with a meal. 60 tablet 6  . furosemide (LASIX) 20 MG tablet Take 1 tablet (20 mg total) by mouth daily as needed (Only when recommended by CHF clinic.). (Patient not taking: Reported on 10/25/2019) 30 tablet 6  . gabapentin (NEURONTIN) 100 MG capsule Take 3 capsules (300 mg total) by mouth 2 (two) times daily. 180 capsule 3  . hydrALAZINE (APRESOLINE) 100 MG tablet TAKE 1 TABLET BY MOUTH THREE TIMES A DAY 270 tablet 9  . losartan (COZAAR) 100 MG tablet Take 1 tablet (100 mg total) by mouth daily. 90 tablet 3  . pantoprazole sodium (PROTONIX) 40 mg/20 mL PACK Take 20 mg by mouth daily.    . potassium chloride SA (KLOR-CON M20) 20 MEQ tablet Take 1 tablet (20 mEq total) by mouth 2 (two) times daily. 180 tablet 3  . traMADol (ULTRAM) 50 MG tablet Take 2 tablets (100 mg total) by mouth every 12 (twelve) hours as needed for moderate pain. 60 tablet 3  . warfarin (COUMADIN) 2.5 MG tablet TAKE 5 mg (2 tabs) by mouth every Monday/Friday and 2.5 mg (1 tab) all other days or as directed by HF Clinic 180 tablet 3   No current facility-administered medications for this visit.    Allergies:   Oxycodone   Social History:  The patient  reports that she has never smoked. She has never used smokeless tobacco. She reports current alcohol use. She reports that she does not use drugs.   Family History:  [name] family history includes Stroke in her brother and mother.   ROS:  Please see the history of present illness.   All other systems are personally reviewed and negative.    Exam:    Vital Signs:  There were no vitals taken for this visit.  Well sounding, alert and conversant   Labs/Other Tests and Data  Reviewed:    Recent Labs: 04/08/2019: ALT 23 09/14/2019: Magnesium 2.2 10/25/2019: BUN 10; Creatinine, Ser 1.12; Hemoglobin 11.7; Platelets 253; Potassium 4.4; Sodium 138   Wt Readings from Last 3 Encounters:  10/27/19 166 lb (75.3 kg)  10/25/19 166 lb 12.8 oz (75.7 kg)  09/15/19 179 lb (81.2 kg)     Last device remote is reviewed from [place] PDF which reveals normal device function, ICD is at [facility].   ASSESSMENT & PLAN:    1.  Chronic systolic dysfunction/ prior VF/ nonischemic CM She has done well for years with destination VAD VF has been controlled with amiodarone We had a long discussion today about her ICD using a shared making decision making process.  She is at [facility].  She would like to not proceed with generator change at this time.  She  will continue with routine care under the VAD team.  If she has further ventricular arrhythmias or desires to have her ICD replaced in the future, she will contact my office. We will bring her in to the office to turn off alerts at a time that is convenience for the patient. She will require close follow-up in the VAD clinic to monitor for toxicity with amiodarone while on this medicine.  2. afib She had afib and was cardioversion in October. Doing well since She is on coumadin with goal INR 1.8-2.3 per VAD clinic notes (reviewed).   Follow-up: I will see as needed going forward   Patient Risk:  after full review of this patients clinical status, I feel that they are at high risk at this time.  Today, I have spent 20 minutes with the patient with telehealth technology discussing arrhythmia management .    Army Fossa, MD  11/15/2019 12:37 PM     [address] [place] [address] [phone] (office) [phone] (fax)

## 2019-11-16 ENCOUNTER — Ambulatory Visit (HOSPITAL_COMMUNITY)
Admission: RE | Admit: 2019-11-16 | Discharge: 2019-11-16 | Disposition: A | Discharge status: Home / Self Care | Payer: Medicare Other | Source: Ambulatory Visit | Attending: Cardiology | Admitting: Cardiology

## 2019-11-16 ENCOUNTER — Ambulatory Visit (HOSPITAL_COMMUNITY): Payer: Self-pay | Admitting: Pharmacist

## 2019-11-16 ENCOUNTER — Encounter (HOSPITAL_COMMUNITY): Payer: Self-pay

## 2019-11-16 ENCOUNTER — Other Ambulatory Visit: Payer: Self-pay

## 2019-11-16 VITALS — BP 106/49 | HR 75 | Temp 97.8°F | Ht 60.0 in | Wt 165.6 lb

## 2019-11-16 DIAGNOSIS — K625 Hemorrhage of anus and rectum: Secondary | ICD-10-CM | POA: Diagnosis not present

## 2019-11-16 DIAGNOSIS — Z8674 Personal history of sudden cardiac arrest: Secondary | ICD-10-CM | POA: Insufficient documentation

## 2019-11-16 DIAGNOSIS — K648 Other hemorrhoids: Secondary | ICD-10-CM | POA: Insufficient documentation

## 2019-11-16 DIAGNOSIS — M4856XA Collapsed vertebra, not elsewhere classified, lumbar region, initial encounter for fracture: Secondary | ICD-10-CM | POA: Insufficient documentation

## 2019-11-16 DIAGNOSIS — I5022 Chronic systolic (congestive) heart failure: Secondary | ICD-10-CM | POA: Diagnosis not present

## 2019-11-16 DIAGNOSIS — I480 Paroxysmal atrial fibrillation: Secondary | ICD-10-CM | POA: Diagnosis not present

## 2019-11-16 DIAGNOSIS — S32000G Wedge compression fracture of unspecified lumbar vertebra, subsequent encounter for fracture with delayed healing: Secondary | ICD-10-CM | POA: Diagnosis not present

## 2019-11-16 DIAGNOSIS — I428 Other cardiomyopathies: Secondary | ICD-10-CM | POA: Diagnosis not present

## 2019-11-16 DIAGNOSIS — Z95811 Presence of heart assist device: Secondary | ICD-10-CM | POA: Insufficient documentation

## 2019-11-16 DIAGNOSIS — Z95810 Presence of automatic (implantable) cardiac defibrillator: Secondary | ICD-10-CM | POA: Diagnosis not present

## 2019-11-16 DIAGNOSIS — Z7901 Long term (current) use of anticoagulants: Secondary | ICD-10-CM | POA: Insufficient documentation

## 2019-11-16 DIAGNOSIS — K644 Residual hemorrhoidal skin tags: Secondary | ICD-10-CM | POA: Insufficient documentation

## 2019-11-16 DIAGNOSIS — Z79899 Other long term (current) drug therapy: Secondary | ICD-10-CM | POA: Insufficient documentation

## 2019-11-16 DIAGNOSIS — M8580 Other specified disorders of bone density and structure, unspecified site: Secondary | ICD-10-CM | POA: Insufficient documentation

## 2019-11-16 DIAGNOSIS — K5790 Diverticulosis of intestine, part unspecified, without perforation or abscess without bleeding: Secondary | ICD-10-CM | POA: Diagnosis not present

## 2019-11-16 DIAGNOSIS — I110 Hypertensive heart disease with heart failure: Secondary | ICD-10-CM | POA: Insufficient documentation

## 2019-11-16 LAB — COMPREHENSIVE METABOLIC PANEL
ALT: 19 U/L (ref 0–44)
AST: 27 U/L (ref 15–41)
Albumin: 3.1 g/dL — ABNORMAL LOW (ref 3.5–5.0)
Alkaline Phosphatase: 115 U/L (ref 38–126)
Anion gap: 11 (ref 5–15)
BUN: 11 mg/dL (ref 8–23)
CO2: 23 mmol/L (ref 22–32)
Calcium: 9.6 mg/dL (ref 8.9–10.3)
Chloride: 104 mmol/L (ref 98–111)
Creatinine, Ser: 1.11 mg/dL — ABNORMAL HIGH (ref 0.44–1.00)
GFR calc Af Amer: 54 mL/min — ABNORMAL LOW (ref >60)
GFR calc non Af Amer: 46 mL/min — ABNORMAL LOW (ref >60)
Glucose, Bld: 96 mg/dL (ref 70–99)
Potassium: 4.3 mmol/L (ref 3.5–5.1)
Sodium: 138 mmol/L (ref 135–145)
Total Bilirubin: 0.5 mg/dL (ref 0.3–1.2)
Total Protein: 9.7 g/dL — ABNORMAL HIGH (ref 6.5–8.1)

## 2019-11-16 LAB — CBC
HCT: 37 % (ref 36.0–46.0)
Hemoglobin: 11.3 g/dL — ABNORMAL LOW (ref 12.0–15.0)
MCH: 31.5 pg (ref 26.0–34.0)
MCHC: 30.5 g/dL (ref 30.0–36.0)
MCV: 103.1 fL — ABNORMAL HIGH (ref 80.0–100.0)
Platelets: 286 10*3/uL (ref 150–400)
RBC: 3.59 MIL/uL — ABNORMAL LOW (ref 3.87–5.11)
RDW: 15.5 % (ref 11.5–15.5)
WBC: 7.2 10*3/uL (ref 4.0–10.5)
nRBC: 0 % (ref 0.0–0.2)

## 2019-11-16 LAB — PROTIME-INR
INR: 2 — ABNORMAL HIGH (ref 0.8–1.2)
Prothrombin Time: 22.1 seconds — ABNORMAL HIGH (ref 11.4–15.2)

## 2019-11-16 LAB — IRON AND TIBC
Iron: 55 ug/dL (ref 28–170)
Saturation Ratios: 28 % (ref 10.4–31.8)
TIBC: 197 ug/dL — ABNORMAL LOW (ref 250–450)
UIBC: 142 ug/dL

## 2019-11-16 LAB — LACTATE DEHYDROGENASE: LDH: 188 U/L (ref 98–192)

## 2019-11-16 LAB — FOLATE: Folate: 10.5 ng/mL (ref >5.9)

## 2019-11-16 LAB — PREALBUMIN: Prealbumin: 17.5 mg/dL — ABNORMAL LOW (ref 18–38)

## 2019-11-16 LAB — VITAMIN B12: Vitamin B-12: 316 pg/mL (ref 180–914)

## 2019-11-16 LAB — TSH: TSH: 4.718 u[IU]/mL — ABNORMAL HIGH (ref 0.350–4.500)

## 2019-11-16 LAB — FERRITIN: Ferritin: 333 ng/mL — ABNORMAL HIGH (ref 11–307)

## 2019-11-16 NOTE — Progress Notes (Signed)
LVAD INR 

## 2019-11-16 NOTE — Progress Notes (Signed)
CSW met with patient in the clinic. Patient reports she is feeling a bit better but still has a way to go with recovery. Patient and caregiver verbalize upcoming zoom meeting and report they hope to attend. Patient asked about covid vaccine opportunities. CSW provided numbers for vaccine appointments. Patient and caregiver grateful for the support and information. Raquel Sarna, [place], [place]

## 2019-11-16 NOTE — Progress Notes (Signed)
Patient presents for 1 mont f/u visit today in [facility] today with caregiver, [name].  Reports no problems with VAD equipment or concerns with drive line.  Pt reports she is still having back pain, but seems to be getting "a little better". She arrived by w/c but did stand to weigh and was able to get on clinic stretcher. She reports she is walking at home, but tires quickly and is staying in bed more than before back injury.  Pt saw Dr. Lorin Mercy on 10/27/19 with no changes in plan and have f/u in one month with him.  Pt was contacted by Dr. Rayann Heman yesterday and was told her ICD is nearing end of life. Dr. Rayann Heman does not recommend generator change and has instructed pt to come to office next week to turn ICD therapy off.   Pt did increase her gabapentin to 200 mg twice daily and reports it has helped "some". She is also taking tramadol prn for pain.  Pt does report some episodes of Low Flow alarms on VAD, but denies feeling symptomatic when alarms occur. She does report improvement in appetite.   She does report continued bright red blood in stools that have occurred twice since last visit. They last for a few times during day and then stop. [name] correlates bleeding when her INR is elevated.   Vital Signs:  Temp: 97.8 Doppler Pressure:  110 Automatc BP:  106/49 (74) HR: 75 SPO2:  98% on RA  Weight: 165.6 lb w/o eqt Last weight: 166.8 lbs  VAD Indication: Destination Therapy - age excluding and patient choice    VAD interrogation & Equipment Management: Speed: 8600 Flow: 3.8 Power: 4.2w    PI: 6.4  Alarms: 9 - 26 Low Flow events over last few days Events: 20 - 30 PI events daily  Fixed speed 8600 Low speed limit: 8000  Primary Controller: Replace back up battery in 10 months. Back up controller: Replace back up battery in 6 months; replaced with [id number] battery with 17 mos  Exit Site Care: Drive line is being maintained weekly by [employer]. Drive line exit site well healed  and incorporated. The velour is fully implanted at exit site. Dressing dry and intact. No erythema or drainage. Stabilization device present and accurately applied. Pt denies fever or chills. Pt provided with 8 weekly dressing kits.   Significant Events on VAD Support:  09/21/16 GIB, Ramp echo speed decreased to 8400  01/08/17 - LOW FLOW alarms; speed increased to 8800 RPM 05/20/17> speed decreased to 8600  Device:[employer] Jude ICD Therapies: on at 200 bpm; monitor on 110 DDD 60 Last check: today: 07/01/19  BP & Labs: Doppler BP 110 -  is reflecting modified systolic  Hgb 99991111 - Reports bright red bleeding with stools twice monthly over last month.  LDH  - 188 and is within established range is 150 - 300. Denies any dark urine.  6.5 year Intermacs follow up completed including: Quality of Life and KCCQ-12. Patient did not attempt Neurocognitive trail making.   Pt unable to complete 6 minute walk due to back pain.  Back up controller: 11V backup battery charged during this visit.   Patient Instructions: 1. No change in medications. 2. Call VAD office if pain continues or no improvement noted in back/hip pain. 3. Return to [facility] in 6 weeks.    Zada Girt RN [place] Coordinator  Office: [phone]  24/7 Pager: [phone]

## 2019-11-17 LAB — T4: T4, Total: 8.2 ug/dL (ref 4.5–12.0)

## 2019-11-17 LAB — T3, FREE: T3, Free: 2.1 pg/mL (ref 2.0–4.4)

## 2019-11-19 NOTE — Progress Notes (Signed)
LVAD Clinic Note  Patient ID: Brianna Hanson, female   DOB: [birth year], 85 y.o.   MRN: [id number] Primary Heart Failure: Dr Haroldine Laws Cardiac Surgeon: Dr Prescott Gum  HPI: Brianna Hanson is a 85 y.o. 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 [name] (follwoed q 6 months). Underwent implantation of the HeartMate II LVAD on 04/06/13 for DT. She is not on aspirin due to dizziness. S/P lap cholecystitis 10/2015.   GI Issues 09/2016 LGIB. Angio- no source of bleeding. LVAD speed turned down to 8400  LVAD Speed Change 12/2016 Ramp ECHO -->Speed increased from 8400>8800  Significant Events on VAD Support:  09/21/16 GIB, Ramp echo speed decreased to 8400  01/08/17 - LOW FLOW alarms; speed increased to 8800 RPM 05/20/17>speed decreased to 8600 06/18/19 - DC-CV of AF   Recently found to be in recurrent AF on device interrogation and amio increased to 200 bid. Underwent successful DC-CV on 06/18/19.  In 12/20 found to have severe L1 compression fracture. Saw Dr. Lorin Mercy in Ortho. Says back back now improved but having severe left hip pain. Was seen by PCP who gave her narcotics but she had hallucinations. Now on tramadol but not getting much relief. No sciatic symptoms. Feels weak. No bowel/bladder incontinence. Poor appetite. Not drinking as much. Denies orthopnea or PND. No fevers, chills or problems with driveline. No bleeding, melena or neuro symptoms. Multiple PI events. No VAD alarms. Taking all meds as prescribed.   She presents today for routine VAD f/u. At last visit appeared to have self-limited diverticular bleed. Says it is much better but still has occasional bleeding - [name] relates the episodes to when her INR is up. Also continues with back and hip pain. Had been following with Dr, Lorin Mercy. They don't feel that they can do much for her. Fortunately she is getting better slowly. She cannot tolerate narcotics.   Appetite has improved.  She reports she is walking at home, but tires quickly and is staying in bed more than before back injury. Denies orthopnea or PND. No fevers, chills or problems with driveline. No neuro symptoms. No VAD alarms. Taking all meds as prescribed.    VAD Indication: Destination Therapy - age excluding and patient choice    VAD interrogation & Equipment Management: Speed: 8600 Flow: 3.8 Power: 4.2w PI: 6.4  Alarms: 9 - 26 Low Flow events over last few days Events: 20 - 30 PI events daily  Fixed speed 8600 Low speed limit: 8000  Primary Controller: Replace back up battery in 10 months. Back up controller: Replace back up battery in6 months; replaced with [id number] battery with 17 mos  Exit Site Care: Drive line is being maintained [employer]. Drive line exit site well healed and incorporated. The velour is fully implanted at exit site. Dressing dry and intact. No erythema or drainage. Stabilization device present and accurately applied. Pt denies fever or chills. Pt provided with 8 weekly dressing kits.   Significant Events on VAD Support:  09/21/16 GIB, Ramp echo speed decreased to 8400  01/08/17 - LOW FLOW alarms; speed increased to 8800 RPM 05/20/17>speed decreased to 8600     Past Medical History:  Diagnosis Date  . AICD (automatic cardioverter/defibrillator) present   . Anemia   . Atrial fibrillation or flutter   . Cardiac arrest - ventricular fibrillation 12/10   with successful resucitation, S/p ICD  .  CHF (congestive heart failure) ([place])   . Diverticula of colon 2011  . HTN (hypertension)    moderate  . ICD (implantable cardiac defibrillator) in place    she has received appropriate therapy for VF  . Internal and external hemorrhoids without complication [id number]  . Nonischemic cardiomyopathy ([name])    follow[facility] Dr April Holding at [facility]  . Osteopenia   . Plasma cell disorder 03/20/2012  . Plasma cell disorder 03/20/2012  . Sessile  colonic polyp 2011   Dr Benson Norway    Current Outpatient Medications  Medication Sig Dispense Refill  . amiodarone (PACERONE) 200 MG tablet Take 1 tablet (200 mg total) by mouth daily. 90 tablet 3  . carvedilol (COREG) 6.25 MG tablet TAKE 1 TABLET BY MOUTH 2 TIMES DAILY WITH A MEAL 180 tablet 3  . Cholecalciferol (VITAMIN D3) 25 MCG (1000 UT) CAPS Take 1,000 Units by mouth daily.     [name] docusate sodium (COLACE) 100 MG capsule Take 100 mg by mouth daily.    . ferrous sulfate 325 (65 FE) MG tablet Take 1 tablet (325 mg total) by mouth 2 (two) times daily with a meal. 60 tablet 6  . gabapentin (NEURONTIN) 100 MG capsule Take 3 capsules (300 mg total) by mouth 2 (two) times daily. (Patient taking differently: Take 200 mg by mouth 2 (two) times daily. ) 180 capsule 3  . hydrALAZINE (APRESOLINE) 100 MG tablet TAKE 1 TABLET BY MOUTH THREE TIMES A DAY 270 tablet 9  . pantoprazole sodium (PROTONIX) 40 mg/20 mL PACK Take 20 mg by mouth daily.    . potassium chloride SA (KLOR-CON M20) 20 MEQ tablet Take 1 tablet (20 mEq total) by mouth 2 (two) times daily. 180 tablet 3  . traMADol (ULTRAM) 50 MG tablet Take 2 tablets (100 mg total) by mouth every 12 (twelve) hours as needed for moderate pain. 60 tablet 3  . warfarin (COUMADIN) 2.5 MG tablet TAKE 5 mg (2 tabs) by mouth every Monday/Friday and 2.5 mg (1 tab) all other days or as directed by HF Clinic 180 tablet 3  . cyclobenzaprine (FLEXERIL) 5 MG tablet TAKE 1 TABLET BY MOUTH TWICE A DAY AS NEEDED FOR MUSCLE SPASM (Patient not taking: Reported on 10/25/2019) 60 tablet 0  . furosemide (LASIX) 20 MG tablet Take 1 tablet (20 mg total) by mouth daily as needed (Only when recommended by CHF clinic.). (Patient not taking: Reported on 10/25/2019) 30 tablet 6  . losartan (COZAAR) 100 MG tablet Take 1 tablet (100 mg total) by mouth daily. 90 tablet 3   No current facility-administered medications for this encounter.   Allergies  Allergen Reactions  . Oxycodone Other (See  Comments)    Hallucinations     Review of systems complete and found to be negative unless listed in HPI.    Vital Signs:  Temp: 97.8 Doppler Pressure:  110 Automatc BP:  106/49 (74) HR: 75 SPO2:  98% on RA  Weight: 165.6 lb w/o eqt Last weight: 166.8 lbs   Exam: General:  Elderly woman. Weak appearing HEENT: normal  Neck: supple. JVP not elevated.  Carotids 2+ bilat; no bruits. No lymphadenopathy or thryomegaly appreciated. Cor: LVAD hum.  Lungs: Clear. Abdomen: soft, nontender, non-distended. No hepatosplenomegaly. No bruits or masses. Good bowel sounds. Driveline site clean. Anchor in place.  Extremities: no cyanosis, clubbing, rash. Warm no edema  Neuro: alert & oriented x 3. No focal deficits. Moves all 4 without problem    ASSESSMENT AND PLAN:  1)  BRBPR - suspect diverticulosis. Had episode in 09/2016.  - 12/2009 Colonoscopy, Dr Benson Norway: descending adenoma and pan-diverticulosis > in sigmoid, int/ext hemorrhoids.  - hgb 12.7 -> 11.7 - seems to tapering down. Hgb 11.7 -> 11.3 today - we have decided not to pursue repeat colonoscopy at this time given age and comorbidities (particualrly severe back/hip issues). If bleeding persists can revisit  2) Low back and left hip pain in setting of L1 compression fracture - Following with Dr. Lorin Mercy. Options limited - Taking tramadol and gabapentin with some releif. Unable to tolerate narcotics  3) Chronic systolic HF: NICM, s/p ICD and LVAD for DT(04/2013).  - now 6+ years out on VAD support - Stable. Typically NYHA II but now III in setting of ortho issues - Volume status ok. Continue to push fluids  4) LVAD placed for DT 04/2013:  - Failed Entresto due to diuresis and incessant low flow alarms.  - Continue losartan 100 mg daily.  - VAD interrogated personally. Parameters stable.. Continues with PI events when po intake down. Push fluids - No ASA with GI bleed and intolerance.  - Continue coumadin. No bleeding.  - LDH 188 -  INR 2.8 Discussed dosing with PharmD personally.Try to keep 1.8-2.3  5) PAF:  - Did not tolerate AF well at all.  - S/p DC-CV 06/18/19 - In NSR tody on amio 200 daily.  - Continue warfarin - no change  5) HTN:  -Blood pressure well controlled. Continue current regimen.\  6) Chronic Anticoagulation:  - As above.   - Goal INR 1.8-2.3.  - INR 2.8 Discussed dosing with PharmD personally.  Total time spent 35 minutes. Over half that time spent discussing above.    Glori Bickers, MD 10:17 PM

## 2019-11-23 ENCOUNTER — Ambulatory Visit (HOSPITAL_COMMUNITY): Payer: Self-pay | Admitting: Pharmacist

## 2019-11-23 LAB — POCT INR: INR: 2.2 (ref 2.0–3.0)

## 2019-11-23 NOTE — Progress Notes (Signed)
LVAD INR 

## 2019-11-24 ENCOUNTER — Ambulatory Visit (INDEPENDENT_AMBULATORY_CARE_PROVIDER_SITE_OTHER): Payer: Medicare Other

## 2019-11-24 ENCOUNTER — Ambulatory Visit (INDEPENDENT_AMBULATORY_CARE_PROVIDER_SITE_OTHER): Payer: Medicare Other | Admitting: Orthopaedic Surgery

## 2019-11-24 ENCOUNTER — Other Ambulatory Visit: Payer: Self-pay

## 2019-11-24 ENCOUNTER — Encounter: Payer: Self-pay | Admitting: Orthopaedic Surgery

## 2019-11-24 VITALS — Ht 60.0 in | Wt 165.0 lb

## 2019-11-24 DIAGNOSIS — S32010D Wedge compression fracture of first lumbar vertebra, subsequent encounter for fracture with routine healing: Secondary | ICD-10-CM | POA: Diagnosis not present

## 2019-11-24 DIAGNOSIS — S32030A Wedge compression fracture of third lumbar vertebra, initial encounter for closed fracture: Secondary | ICD-10-CM | POA: Diagnosis not present

## 2019-11-24 NOTE — Progress Notes (Signed)
Office Visit Note   Patient: Brianna Hanson           Date of Birth: 09-01-1935           MRN: [id number] Visit Date: 11/24/2019              Requested by: Janith Lima, MD [address] [place],  [place] [address] PCP: Janith Lima, MD   Assessment & Plan: Visit Diagnoses:  1. Compression fracture of L1 vertebra with routine healing, subsequent encounter   2. Closed compression fracture of L3 lumbar vertebra, initial encounter ([place])     Plan: Patient has new L3 compression fracture that likely occurred 2 weeks ago when she had sudden sharp increase in pain.  Her L1 compression fracture was doing better with significant improvement in her pain prior to this sudden episode with L3 breaking.  We discussed that L3 should continue to heal and should get pain relief in a number of weeks.  I plan to recheck her in 6 weeks.  We discussed avoiding excessive bending or lifting.  She is continuing to take Calcium and vitamin D.  Follow-Up Instructions: Return in about 6 weeks (around 01/05/2020).   Orders:  Orders Placed This Encounter  Procedures  . XR Lumbar Spine 2-3 Views   No orders of the defined types were placed in this encounter.     Procedures: No procedures performed   Clinical Data: No additional findings.   Subjective: Chief Complaint  Patient presents with  . Lower Back - Fracture, Follow-up    HPI 85 year old female with heart failure and left ventricular assist device present had been seen for L1 compression fracture.  She states she was doing better and then 2 weeks ago when she was walking down the hall hanging on the wall she felt sharp increased back pain.  She has been using some Ultram but it tends to just make her sleep.  Stronger medication gave her more problems.  She states she is having minimal pain if she is not moving when she gets up and is active bending or standing she has increased pain.  New x-rays were obtained today.  Review of  Systemsupdated and unchanged.    Objective: Vital Signs: Ht 5' (1.524 m)   Wt 165 lb (74.8 kg)   BMI 32.22 kg/m   Physical Exam Constitutional:      Appearance: She is well-developed.  HENT:     Head: Normocephalic.     Right Ear: External ear normal.     Left Ear: External ear normal.  Eyes:     Pupils: Pupils are equal, round, and reactive to light.  Neck:     Thyroid: No thyromegaly.     Trachea: No tracheal deviation.  Cardiovascular:     Rate and Rhythm: Normal rate.  Pulmonary:     Effort: Pulmonary effort is normal.  Abdominal:     Palpations: Abdomen is soft.  Skin:    General: Skin is warm and dry.  Neurological:     Mental Status: She is alert and oriented to person, place, and time.  Psychiatric:        Behavior: Behavior normal.     Ortho Examsensation L1-L5 dermatome intact. Quads adductor intact.   Specialty Comments:  No specialty comments available.  Imaging: XR Lumbar Spine 2-3 Views  Result Date: 11/24/2019 AP lateral lumbar spine x-rays obtained and reviewed this shows a left ventricular assist device.  Comparison to 10/27/2019 images.  Stable L1 compression fracture unchanged.  There is a new L3 compression fracture 40% since last month's images. Impression: Stable old L1 compression fracture.  New 40% L3 compression fracture.    PMFS History: Patient Active Problem List   Diagnosis Date Noted  . Closed compression fracture of L3 lumbar vertebra, initial encounter ([place]) 11/24/2019  . Primary osteoarthritis of both hips 09/07/2019  . Acute hip pain, bilateral 09/06/2019  . Osteoporosis with pathological fracture 09/06/2019  . Lumbar compression fracture ([place]) 08/13/2019  . Acute left-sided low back pain with left-sided sciatica 08/04/2019  . Closed wedge compression fracture of first lumbar vertebra ([place]) 08/04/2019  . Hyperlipidemia LDL goal <130 03/04/2018  . Eczema of lower extremity 03/04/2018  . Diverticulosis of colon with hemorrhage    . Primary osteoarthritis of left knee 09/05/2016  . Iron deficiency anemia 07/11/2015  . Chronic anticoagulation 11/24/2013  . Chronic systolic congestive heart failure ([place]) 05/01/2013  . LVAD (left ventricular assist device) present ([place]) 04/07/2013  . Right heart failure ([place]) 03/29/2013  . PAH (pulmonary artery hypertension) ([place]) 03/29/2013  . Plasma cell disorder 03/20/2012  . Long term current use of anticoagulant therapy 02/14/2012  . Other abnormal glucose 02/03/2012  . Chronic renal insufficiency, stage II (mild) 02/03/2012  . Atrial fibrillation ([place]) 11/16/2010  . Essential hypertension, benign 05/13/2007  . CARDIOMYOPATHY, PRIMARY NEC 05/13/2007  . OSTEOPENIA 05/13/2007   Past Medical History:  Diagnosis Date  . AICD (automatic cardioverter/defibrillator) present   . Anemia   . Atrial fibrillation or flutter   . Cardiac arrest - ventricular fibrillation 12/10   with successful resucitation, S/p ICD  . CHF (congestive heart failure) ([place])   . Diverticula of colon 2011  . HTN (hypertension)    moderate  . ICD (implantable cardiac defibrillator) in place    she has received appropriate therapy for VF  . Internal and external hemorrhoids without complication [id number]  . Nonischemic cardiomyopathy ([place])    followed by Dr April Holding at [facility]  . Osteopenia   . Plasma cell disorder 03/20/2012  . Plasma cell disorder 03/20/2012  . Sessile colonic polyp 2011   Dr Benson Norway    Family History  Problem Relation Age of Onset  . Stroke Mother   . Stroke Brother     Past Surgical History:  Procedure Laterality Date  . ABDOMINAL HYSTERECTOMY    . CARDIAC CATHETERIZATION    . CARDIAC DEFIBRILLATOR PLACEMENT     by Greggory Brandy for secondary prevention of sudden death  . CARDIOVERSION N/A 07/18/19   Procedure: CARDIOVERSION;  Surgeon: Jolaine Artist, MD;  Location: [facility] ENDOSCOPY;  Service: Cardiovascular;  Laterality: N/A;  . CHOLECYSTECTOMY N/A 10/09/2015   Procedure: LAPAROSCOPIC  CHOLECYSTECTOMY;  Surgeon: Rolm Bookbinder, MD;  Location: [facility];  Service: General;  Laterality: N/A;  . COLONOSCOPY W/ POLYPECTOMY  2011   Dr Benson Norway  . INSERTION OF IMPLANTABLE LEFT VENTRICULAR ASSIST DEVICE N/A 04/06/2013   Procedure: INSERTION OF IMPLANTABLE LEFT VENTRICULAR ASSIST DEVICE;  Surgeon: Gaye Pollack, MD;  Location: [facility];  Service: Open Heart Surgery;  Laterality: N/A;  . INTRAOPERATIVE TRANSESOPHAGEAL ECHOCARDIOGRAM N/A 04/06/2013   Procedure: INTRAOPERATIVE TRANSESOPHAGEAL ECHOCARDIOGRAM;  Surgeon: Gaye Pollack, MD;  Location: [facility] OR;  Service: Open Heart Surgery;  Laterality: N/A;  . IR GENERIC HISTORICAL  09/24/2016   IR US GUIDE VASC ACCESS RIGHT 09/24/2016 Greggory Keen, MD [facility]  . IR GENERIC HISTORICAL  09/24/2016   IR ANGIOGRAM VISCERAL SELECTIVE 09/24/2016 Greggory Keen, MD [facility]  .  IR GENERIC HISTORICAL  09/24/2016   IR ANGIOGRAM VISCERAL SELECTIVE 09/24/2016 Greggory Keen, MD [facility]  . PLACEMENT OF CENTRIMAG VENTRICULAR ASSIST DEVICE Right 04/06/2013   Procedure: PLACEMENT OF CENTRIMAG VENTRICULAR ASSIST DEVICE;  Surgeon: Gaye Pollack, MD;  Location: [facility];  Service: Open Heart Surgery;  Laterality: Right;   Social History   Occupational History  . Not on file  Tobacco Use  . Smoking status: Never Smoker  . Smokeless tobacco: Never Used  . Tobacco comment: remote history of tobacco abuse  Substance and Sexual Activity  . Alcohol use: Yes    Comment: occsionally wine  . Drug use: No  . Sexual activity: Not Currently    Birth control/protection: Surgical

## 2019-11-30 ENCOUNTER — Ambulatory Visit (HOSPITAL_COMMUNITY): Payer: Self-pay | Admitting: Pharmacist

## 2019-11-30 ENCOUNTER — Ambulatory Visit (INDEPENDENT_AMBULATORY_CARE_PROVIDER_SITE_OTHER): Payer: Medicare Other | Admitting: *Deleted

## 2019-11-30 ENCOUNTER — Other Ambulatory Visit: Payer: Self-pay

## 2019-11-30 DIAGNOSIS — I5022 Chronic systolic (congestive) heart failure: Secondary | ICD-10-CM | POA: Diagnosis not present

## 2019-11-30 DIAGNOSIS — I4901 Ventricular fibrillation: Secondary | ICD-10-CM

## 2019-11-30 DIAGNOSIS — Z7901 Long term (current) use of anticoagulants: Secondary | ICD-10-CM | POA: Diagnosis not present

## 2019-11-30 LAB — CUP PACEART INCLINIC DEVICE CHECK
Battery Remaining Longevity: 0 mo
Brady Statistic RA Percent Paced: 6.4 %
Brady Statistic RV Percent Paced: 0.08 %
Date Time Interrogation Session: 20210330085100
HighPow Impedance: 48.0938
Implantable Lead Implant Date: 20110104
Implantable Lead Implant Date: 20110104
Implantable Lead Location: 753859
Implantable Lead Location: 753860
Implantable Lead Model: 7121
Implantable Pulse Generator Implant Date: 20110104
Lead Channel Impedance Value: 312.5 Ohm
Lead Channel Impedance Value: 325 Ohm
Lead Channel Sensing Intrinsic Amplitude: 1 mV
Lead Channel Sensing Intrinsic Amplitude: 11.4 mV
Lead Channel Setting Pacing Amplitude: 2.5 V
Lead Channel Setting Pacing Amplitude: 2.5 V
Lead Channel Setting Pacing Pulse Width: 0.5 ms
Lead Channel Setting Sensing Sensitivity: 0.5 mV
Pulse Gen Serial Number: 754815

## 2019-11-30 LAB — POCT INR: INR: 3.6 — AB (ref 2.0–3.0)

## 2019-11-30 NOTE — Progress Notes (Signed)
LVAD INR 

## 2019-11-30 NOTE — Patient Instructions (Signed)
Unplug remote monitor when you get home. Call the office if you receive a shock  or have any concerns. Folow the shock plan for shocks received.

## 2019-12-02 ENCOUNTER — Other Ambulatory Visit (HOSPITAL_COMMUNITY): Payer: Self-pay | Admitting: Internal Medicine

## 2019-12-07 ENCOUNTER — Ambulatory Visit (HOSPITAL_COMMUNITY): Payer: Self-pay | Admitting: Pharmacist

## 2019-12-07 ENCOUNTER — Other Ambulatory Visit (HOSPITAL_COMMUNITY): Payer: Self-pay | Admitting: Internal Medicine

## 2019-12-07 DIAGNOSIS — I10 Essential (primary) hypertension: Secondary | ICD-10-CM

## 2019-12-07 DIAGNOSIS — I5022 Chronic systolic (congestive) heart failure: Secondary | ICD-10-CM

## 2019-12-07 LAB — POCT INR: INR: 2.2 (ref 2.0–3.0)

## 2019-12-07 NOTE — Progress Notes (Signed)
LVAD INR 

## 2019-12-14 ENCOUNTER — Ambulatory Visit (HOSPITAL_COMMUNITY): Payer: Self-pay | Admitting: Pharmacist

## 2019-12-14 LAB — POCT INR: INR: 4.3 — AB (ref 2.0–3.0)

## 2019-12-14 MED ORDER — WARFARIN SODIUM 2.5 MG PO TABS: ORAL_TABLET | ORAL | 3 refills | Status: DC

## 2019-12-14 NOTE — Progress Notes (Signed)
LVAD INR 

## 2019-12-21 ENCOUNTER — Other Ambulatory Visit (HOSPITAL_COMMUNITY): Payer: Self-pay | Admitting: *Deleted

## 2019-12-21 ENCOUNTER — Ambulatory Visit (HOSPITAL_COMMUNITY): Payer: Self-pay | Admitting: Pharmacist

## 2019-12-21 DIAGNOSIS — Z95811 Presence of heart assist device: Secondary | ICD-10-CM

## 2019-12-21 DIAGNOSIS — Z7901 Long term (current) use of anticoagulants: Secondary | ICD-10-CM

## 2019-12-21 LAB — POCT INR: INR: 5.1 — AB (ref 2.0–3.0)

## 2019-12-21 MED ORDER — WARFARIN SODIUM 2.5 MG PO TABS: ORAL_TABLET | ORAL | 3 refills | Status: DC

## 2019-12-21 NOTE — Progress Notes (Signed)
LVAD INR 

## 2019-12-22 ENCOUNTER — Inpatient Hospital Stay (HOSPITAL_COMMUNITY)
Admission: EM | Admit: 2019-12-22 | Discharge: 2020-01-07 | DRG: 516 | Disposition: A | Discharge status: Home Health | Payer: Medicare Other | Attending: Internal Medicine | Admitting: Internal Medicine

## 2019-12-22 ENCOUNTER — Ambulatory Visit (HOSPITAL_BASED_OUTPATIENT_CLINIC_OR_DEPARTMENT_OTHER)
Admission: RE | Admit: 2019-12-22 | Discharge: 2019-12-22 | Disposition: A | Discharge status: Home / Self Care | Payer: Medicare Other | Source: Ambulatory Visit | Attending: Internal Medicine | Admitting: Internal Medicine

## 2019-12-22 ENCOUNTER — Other Ambulatory Visit: Payer: Self-pay

## 2019-12-22 ENCOUNTER — Encounter (HOSPITAL_COMMUNITY): Payer: Self-pay

## 2019-12-22 ENCOUNTER — Emergency Department (HOSPITAL_COMMUNITY): Payer: Medicare Other

## 2019-12-22 VITALS — BP 105/63 | HR 76

## 2019-12-22 DIAGNOSIS — I472 Ventricular tachycardia: Secondary | ICD-10-CM | POA: Diagnosis not present

## 2019-12-22 DIAGNOSIS — R1110 Vomiting, unspecified: Secondary | ICD-10-CM | POA: Diagnosis not present

## 2019-12-22 DIAGNOSIS — Z03818 Encounter for observation for suspected exposure to other biological agents ruled out: Secondary | ICD-10-CM | POA: Diagnosis not present

## 2019-12-22 DIAGNOSIS — I2721 Secondary pulmonary arterial hypertension: Secondary | ICD-10-CM | POA: Diagnosis present

## 2019-12-22 DIAGNOSIS — M160 Bilateral primary osteoarthritis of hip: Secondary | ICD-10-CM | POA: Diagnosis present

## 2019-12-22 DIAGNOSIS — R109 Unspecified abdominal pain: Secondary | ICD-10-CM

## 2019-12-22 DIAGNOSIS — Z7901 Long term (current) use of anticoagulants: Secondary | ICD-10-CM

## 2019-12-22 DIAGNOSIS — S32010A Wedge compression fracture of first lumbar vertebra, initial encounter for closed fracture: Secondary | ICD-10-CM | POA: Diagnosis not present

## 2019-12-22 DIAGNOSIS — I110 Hypertensive heart disease with heart failure: Secondary | ICD-10-CM | POA: Diagnosis not present

## 2019-12-22 DIAGNOSIS — I480 Paroxysmal atrial fibrillation: Secondary | ICD-10-CM | POA: Diagnosis present

## 2019-12-22 DIAGNOSIS — X58XXXA Exposure to other specified factors, initial encounter: Secondary | ICD-10-CM | POA: Insufficient documentation

## 2019-12-22 DIAGNOSIS — I428 Other cardiomyopathies: Secondary | ICD-10-CM | POA: Diagnosis not present

## 2019-12-22 DIAGNOSIS — E785 Hyperlipidemia, unspecified: Secondary | ICD-10-CM | POA: Diagnosis present

## 2019-12-22 DIAGNOSIS — R791 Abnormal coagulation profile: Secondary | ICD-10-CM | POA: Diagnosis present

## 2019-12-22 DIAGNOSIS — Z95811 Presence of heart assist device: Secondary | ICD-10-CM

## 2019-12-22 DIAGNOSIS — M4854XD Collapsed vertebra, not elsewhere classified, thoracic region, subsequent encounter for fracture with routine healing: Secondary | ICD-10-CM | POA: Diagnosis present

## 2019-12-22 DIAGNOSIS — M4856XG Collapsed vertebra, not elsewhere classified, lumbar region, subsequent encounter for fracture with delayed healing: Secondary | ICD-10-CM | POA: Diagnosis not present

## 2019-12-22 DIAGNOSIS — Z885 Allergy status to narcotic agent status: Secondary | ICD-10-CM

## 2019-12-22 DIAGNOSIS — M8580 Other specified disorders of bone density and structure, unspecified site: Secondary | ICD-10-CM | POA: Insufficient documentation

## 2019-12-22 DIAGNOSIS — E440 Moderate protein-calorie malnutrition: Secondary | ICD-10-CM | POA: Insufficient documentation

## 2019-12-22 DIAGNOSIS — Z90710 Acquired absence of both cervix and uterus: Secondary | ICD-10-CM | POA: Diagnosis not present

## 2019-12-22 DIAGNOSIS — I5022 Chronic systolic (congestive) heart failure: Secondary | ICD-10-CM | POA: Diagnosis present

## 2019-12-22 DIAGNOSIS — Z9049 Acquired absence of other specified parts of digestive tract: Secondary | ICD-10-CM | POA: Diagnosis not present

## 2019-12-22 DIAGNOSIS — S22070A Wedge compression fracture of T9-T10 vertebra, initial encounter for closed fracture: Secondary | ICD-10-CM | POA: Diagnosis not present

## 2019-12-22 DIAGNOSIS — S32000G Wedge compression fracture of unspecified lumbar vertebra, subsequent encounter for fracture with delayed healing: Secondary | ICD-10-CM | POA: Diagnosis not present

## 2019-12-22 DIAGNOSIS — Z95810 Presence of automatic (implantable) cardiac defibrillator: Secondary | ICD-10-CM

## 2019-12-22 DIAGNOSIS — Z7289 Other problems related to lifestyle: Secondary | ICD-10-CM

## 2019-12-22 DIAGNOSIS — S32000A Wedge compression fracture of unspecified lumbar vertebra, initial encounter for closed fracture: Secondary | ICD-10-CM | POA: Diagnosis present

## 2019-12-22 DIAGNOSIS — S32019A Unspecified fracture of first lumbar vertebra, initial encounter for closed fracture: Secondary | ICD-10-CM | POA: Insufficient documentation

## 2019-12-22 DIAGNOSIS — Z87891 Personal history of nicotine dependence: Secondary | ICD-10-CM

## 2019-12-22 DIAGNOSIS — R112 Nausea with vomiting, unspecified: Secondary | ICD-10-CM | POA: Diagnosis present

## 2019-12-22 DIAGNOSIS — Z86010 Personal history of colonic polyps: Secondary | ICD-10-CM | POA: Insufficient documentation

## 2019-12-22 DIAGNOSIS — R52 Pain, unspecified: Secondary | ICD-10-CM

## 2019-12-22 DIAGNOSIS — S22000A Wedge compression fracture of unspecified thoracic vertebra, initial encounter for closed fracture: Secondary | ICD-10-CM | POA: Diagnosis not present

## 2019-12-22 DIAGNOSIS — M4856XA Collapsed vertebra, not elsewhere classified, lumbar region, initial encounter for fracture: Secondary | ICD-10-CM | POA: Diagnosis not present

## 2019-12-22 DIAGNOSIS — Z79891 Long term (current) use of opiate analgesic: Secondary | ICD-10-CM

## 2019-12-22 DIAGNOSIS — D472 Monoclonal gammopathy: Secondary | ICD-10-CM | POA: Diagnosis present

## 2019-12-22 DIAGNOSIS — M4854XA Collapsed vertebra, not elsewhere classified, thoracic region, initial encounter for fracture: Secondary | ICD-10-CM | POA: Diagnosis not present

## 2019-12-22 DIAGNOSIS — R630 Anorexia: Secondary | ICD-10-CM | POA: Diagnosis present

## 2019-12-22 DIAGNOSIS — Z79899 Other long term (current) drug therapy: Secondary | ICD-10-CM | POA: Insufficient documentation

## 2019-12-22 DIAGNOSIS — Z8719 Personal history of other diseases of the digestive system: Secondary | ICD-10-CM | POA: Insufficient documentation

## 2019-12-22 DIAGNOSIS — Z823 Family history of stroke: Secondary | ICD-10-CM | POA: Diagnosis not present

## 2019-12-22 DIAGNOSIS — R110 Nausea: Secondary | ICD-10-CM

## 2019-12-22 DIAGNOSIS — I10 Essential (primary) hypertension: Secondary | ICD-10-CM | POA: Diagnosis not present

## 2019-12-22 DIAGNOSIS — E860 Dehydration: Secondary | ICD-10-CM | POA: Diagnosis present

## 2019-12-22 DIAGNOSIS — E669 Obesity, unspecified: Secondary | ICD-10-CM | POA: Diagnosis present

## 2019-12-22 DIAGNOSIS — S32030G Wedge compression fracture of third lumbar vertebra, subsequent encounter for fracture with delayed healing: Secondary | ICD-10-CM

## 2019-12-22 DIAGNOSIS — S22080A Wedge compression fracture of T11-T12 vertebra, initial encounter for closed fracture: Secondary | ICD-10-CM | POA: Diagnosis not present

## 2019-12-22 DIAGNOSIS — S32030A Wedge compression fracture of third lumbar vertebra, initial encounter for closed fracture: Secondary | ICD-10-CM | POA: Diagnosis not present

## 2019-12-22 DIAGNOSIS — I509 Heart failure, unspecified: Secondary | ICD-10-CM | POA: Diagnosis not present

## 2019-12-22 DIAGNOSIS — Z8674 Personal history of sudden cardiac arrest: Secondary | ICD-10-CM | POA: Insufficient documentation

## 2019-12-22 DIAGNOSIS — R079 Chest pain, unspecified: Secondary | ICD-10-CM | POA: Diagnosis not present

## 2019-12-22 DIAGNOSIS — Z20822 Contact with and (suspected) exposure to covid-19: Secondary | ICD-10-CM | POA: Diagnosis present

## 2019-12-22 DIAGNOSIS — Z6831 Body mass index (BMI) 31.0-31.9, adult: Secondary | ICD-10-CM

## 2019-12-22 DIAGNOSIS — S32039A Unspecified fracture of third lumbar vertebra, initial encounter for closed fracture: Secondary | ICD-10-CM | POA: Insufficient documentation

## 2019-12-22 DIAGNOSIS — K5900 Constipation, unspecified: Secondary | ICD-10-CM | POA: Diagnosis not present

## 2019-12-22 DIAGNOSIS — S32040A Wedge compression fracture of fourth lumbar vertebra, initial encounter for closed fracture: Secondary | ICD-10-CM | POA: Diagnosis not present

## 2019-12-22 LAB — COMPREHENSIVE METABOLIC PANEL
ALT: 19 U/L (ref 0–44)
AST: 25 U/L (ref 15–41)
Albumin: 3 g/dL — ABNORMAL LOW (ref 3.5–5.0)
Alkaline Phosphatase: 120 U/L (ref 38–126)
Anion gap: 12 (ref 5–15)
BUN: 5 mg/dL — ABNORMAL LOW (ref 8–23)
CO2: 20 mmol/L — ABNORMAL LOW (ref 22–32)
Calcium: 9.6 mg/dL (ref 8.9–10.3)
Chloride: 107 mmol/L (ref 98–111)
Creatinine, Ser: 0.86 mg/dL (ref 0.44–1.00)
GFR calc Af Amer: >60 mL/min (ref >60)
GFR calc non Af Amer: >60 mL/min (ref >60)
Glucose, Bld: 118 mg/dL — ABNORMAL HIGH (ref 70–99)
Potassium: 4.1 mmol/L (ref 3.5–5.1)
Sodium: 139 mmol/L (ref 135–145)
Total Bilirubin: 0.3 mg/dL (ref 0.3–1.2)
Total Protein: 9.2 g/dL — ABNORMAL HIGH (ref 6.5–8.1)

## 2019-12-22 LAB — CBC
HCT: 37.4 % (ref 36.0–46.0)
Hemoglobin: 11.4 g/dL — ABNORMAL LOW (ref 12.0–15.0)
MCH: 30.9 pg (ref 26.0–34.0)
MCHC: 30.5 g/dL (ref 30.0–36.0)
MCV: 101.4 fL — ABNORMAL HIGH (ref 80.0–100.0)
Platelets: 270 10*3/uL (ref 150–400)
RBC: 3.69 MIL/uL — ABNORMAL LOW (ref 3.87–5.11)
RDW: 14.5 % (ref 11.5–15.5)
WBC: 7.2 10*3/uL (ref 4.0–10.5)
nRBC: 0 % (ref 0.0–0.2)

## 2019-12-22 LAB — PROTIME-INR
INR: 3.6 — ABNORMAL HIGH (ref 0.8–1.2)
Prothrombin Time: 35.8 seconds — ABNORMAL HIGH (ref 11.4–15.2)

## 2019-12-22 LAB — MRSA PCR SCREENING: MRSA by PCR: NEGATIVE

## 2019-12-22 LAB — LACTATE DEHYDROGENASE: LDH: 203 U/L — ABNORMAL HIGH (ref 98–192)

## 2019-12-22 LAB — RESPIRATORY PANEL BY RT PCR (FLU A&B, COVID)
Influenza A by PCR: NEGATIVE
Influenza B by PCR: NEGATIVE
SARS Coronavirus 2 by RT PCR: NEGATIVE

## 2019-12-22 MED ORDER — ACETAMINOPHEN 325 MG PO TABS
650.0000 mg | ORAL_TABLET | ORAL | Status: DC | PRN
  Administered 2019-12-25 – 2020-01-02 (×4): 650 mg via ORAL
  Filled 2019-12-22 (×4): qty 2

## 2019-12-22 MED ORDER — FENTANYL CITRATE (PF) 100 MCG/2ML IJ SOLN
25.0000 ug | INTRAMUSCULAR | Status: DC | PRN
  Administered 2019-12-23 – 2020-01-07 (×8): 25 ug via INTRAVENOUS
  Filled 2019-12-22 (×9): qty 2

## 2019-12-22 MED ORDER — HYDRALAZINE HCL 50 MG PO TABS
100.0000 mg | ORAL_TABLET | Freq: Three times a day (TID) | ORAL | Status: DC
  Administered 2019-12-22 – 2020-01-01 (×21): 100 mg via ORAL
  Filled 2019-12-22 (×25): qty 2

## 2019-12-22 MED ORDER — AMIODARONE HCL 200 MG PO TABS
200.0000 mg | ORAL_TABLET | Freq: Every day | ORAL | Status: DC
  Administered 2019-12-22 – 2020-01-07 (×16): 200 mg via ORAL
  Filled 2019-12-22 (×16): qty 1

## 2019-12-22 MED ORDER — KCL IN DEXTROSE-NACL 10-5-0.45 MEQ/L-%-% IV SOLN
INTRAVENOUS | Status: AC
  Filled 2019-12-22: qty 1000

## 2019-12-22 MED ORDER — ONDANSETRON HCL 4 MG/2ML IJ SOLN
4.0000 mg | Freq: Four times a day (QID) | INTRAMUSCULAR | Status: DC | PRN
  Administered 2019-12-23 – 2020-01-04 (×11): 4 mg via INTRAVENOUS
  Filled 2019-12-22 (×12): qty 2

## 2019-12-22 MED ORDER — CARVEDILOL 6.25 MG PO TABS
6.2500 mg | ORAL_TABLET | Freq: Two times a day (BID) | ORAL | Status: DC
  Administered 2019-12-22 – 2020-01-07 (×25): 6.25 mg via ORAL
  Filled 2019-12-22 (×28): qty 1

## 2019-12-22 MED ORDER — ONDANSETRON HCL 4 MG PO TABS: 4.0000 mg | ORAL_TABLET | Freq: Three times a day (TID) | ORAL | 4 refills | Status: AC | PRN

## 2019-12-22 MED ORDER — TRAMADOL HCL 50 MG PO TABS: 100.0000 mg | ORAL_TABLET | Freq: Two times a day (BID) | ORAL | 3 refills | Status: DC | PRN

## 2019-12-22 MED ORDER — DOCUSATE SODIUM 100 MG PO CAPS
100.0000 mg | ORAL_CAPSULE | Freq: Every day | ORAL | Status: DC
  Administered 2019-12-24 – 2020-01-07 (×14): 100 mg via ORAL
  Filled 2019-12-22 (×14): qty 1

## 2019-12-22 MED ORDER — METOCLOPRAMIDE HCL 5 MG/ML IJ SOLN
10.0000 mg | Freq: Once | INTRAMUSCULAR | Status: AC
  Administered 2019-12-22: 10 mg via INTRAVENOUS
  Filled 2019-12-22: qty 2

## 2019-12-22 MED ORDER — SODIUM CHLORIDE 0.9 % IV BOLUS
500.0000 mL | Freq: Once | INTRAVENOUS | Status: AC
  Administered 2019-12-22: 20:00:00 500 mL via INTRAVENOUS

## 2019-12-22 MED ORDER — LOSARTAN POTASSIUM 50 MG PO TABS
100.0000 mg | ORAL_TABLET | Freq: Every day | ORAL | Status: DC
  Administered 2019-12-22 – 2020-01-01 (×10): 100 mg via ORAL
  Filled 2019-12-22 (×10): qty 2

## 2019-12-22 MED ORDER — PROMETHAZINE HCL 12.5 MG PO TABS: 12.5000 mg | ORAL_TABLET | Freq: Four times a day (QID) | ORAL | 4 refills | Status: AC | PRN

## 2019-12-22 MED ORDER — ONDANSETRON HCL 4 MG/2ML IJ SOLN
4.0000 mg | Freq: Once | INTRAMUSCULAR | Status: AC
  Administered 2019-12-22: 20:00:00 4 mg via INTRAVENOUS
  Filled 2019-12-22: qty 2

## 2019-12-22 MED ORDER — FENTANYL CITRATE (PF) 100 MCG/2ML IJ SOLN
25.0000 ug | Freq: Once | INTRAMUSCULAR | Status: AC
  Administered 2019-12-22: 20:00:00 25 ug via INTRAVENOUS
  Filled 2019-12-22: qty 2

## 2019-12-22 MED ORDER — TRAMADOL HCL 50 MG PO TABS
100.0000 mg | ORAL_TABLET | Freq: Two times a day (BID) | ORAL | Status: DC | PRN
  Administered 2019-12-24 – 2020-01-05 (×9): 100 mg via ORAL
  Filled 2019-12-22 (×9): qty 2

## 2019-12-22 MED ORDER — PANTOPRAZOLE SODIUM 40 MG PO PACK
20.0000 mg | PACK | Freq: Every day | ORAL | Status: DC
  Administered 2019-12-22 – 2020-01-07 (×16): 20 mg via ORAL
  Filled 2019-12-22 (×16): qty 20

## 2019-12-22 MED ORDER — GABAPENTIN 300 MG PO CAPS
300.0000 mg | ORAL_CAPSULE | Freq: Two times a day (BID) | ORAL | Status: DC
  Administered 2019-12-22 – 2020-01-07 (×30): 300 mg via ORAL
  Filled 2019-12-22 (×31): qty 1

## 2019-12-22 NOTE — Progress Notes (Signed)
Patient presents for sick visit visit today in [facility] today with caregiver, [name]. Reports no problems with VAD equipment or concerns with drive line.  Pt reports she is still having severe back pain. Saw Dr Lorin Mercy about a month ago and was found to have new L3 compression fracture. She arrived by wheelchair today but was able to transfer to clinic stretcher with assistance. Movement is very painful for her. She is spending a lot of time in bed due to the pain. She is taking PRN Tramadol at home; this does help some with the pain, but makes her sleepy. Refill sent to pharmacy per Dr Haroldine Laws.   Plan to see Dr Lorin Mercy 01/05/20 for follow up.   Reports frequent nausea and occasional vomiting. Poor PO intake in general related to pain. Prescriptions for PRN Zofran and Phenergan sent in to pharmacy.   Pt does report some episodes of Low Flow alarms on VAD, but denies feeling symptomatic when alarms occur. Low flows seen on VAD interrogation. See below.   She does report continued bright red blood in stools that have occurred once since last visit. They last for a few times during day and then stop. [name] correlates bleeding when her INR is elevated. Last occurrence was Monday this week.   Vital Signs:  Temp:  Doppler Pressure:  130 Automatc BP: 105/63 (85) HR: 76 SPO2:  98% on RA  Weight: 165.6 lb w/o eqt (UTO today due to back pain) Last weight: 166.8 lbs  VAD Indication: Destination Therapy - age excluding and patient choice    VAD interrogation & Equipment Management: Speed: 8600 Flow: 3.8 Power: 4.2w    PI: 6.4  Alarms: LOW FLOWS- several days with 1-35 LFs; then some days without any Events: 20 - 30 PI events daily  Fixed speed 8600 Low speed limit: 8000  Primary Controller: Replace back up battery in 9 months. Back up controller: Replace back up battery in 17 months  Exit Site Care: Drive line is being maintained weekly by [employer]. Drive line exit site well healed and  incorporated. The velour is fully implanted at exit site. Dressing dry and intact. No erythema or drainage. Stabilization device present and accurately applied. Pt denies fever or chills. Pt provided with 8 weekly dressing kits and 5 anchors.  Significant Events on VAD Support:  09/21/16 GIB, Ramp echo speed decreased to 8400  01/08/17 - LOW FLOW alarms; speed increased to 8800 RPM 05/20/17> speed decreased to 8600  Device:St Jude ICD Therapies: on at 200 bpm; monitor on 110 DDD 60 Last check: today: 07/01/19  BP & Labs: Doppler BP 130 -  is reflecting modified systolic  Hgb 99991111 - Reports bright red bleeding with stools once this week; has since resolved. Denies nosebleeds  LDH 203 - and is within established range is 150 - 300. Denies any dark urine.  Patient Instructions: 1. No medication changes today 2. Coumadin dosing per Ander Purpura PharmD 3. Return to [facility] in 6 weeks 4. Refill sent to pharmacy for Zofran, Phenergan, and Tramadol  Emerson Monte RN [place] Coordinator  Office: [phone]  24/7 Pager: [phone]

## 2019-12-22 NOTE — ED Triage Notes (Signed)
Pt here from home with c/o chest  pain and vomiting pt is a Lvad pt

## 2019-12-22 NOTE — H&P (Signed)
LVAD Team H&O  Patient ID: Brianna Hanson, female   DOB: [birth year], 85 y.o.   MRN: [id number] Primary Heart Failure: Brianna Hanson Cardiac Surgeon: Brianna Hanson  Reason for admission: Ab pain with refractory nausea and vomiting due to lumbar compression fracture  HPI: Brianna Hanson is a 85 y.o. female with a PMH of HF due to severe NICM, chronic systolic HF,  PAF,  plasma cell disorder (Likely IgA MGUS) - followed by Brianna Hanson (follwoed q 6 months). Underwent implantation of the HeartMate II LVAD on 04/06/13 for DT.   GI Issues 09/2016 LGIB. Angio- no source of bleeding. LVAD speed turned down to 8400  LVAD Speed Change 12/2016 Ramp ECHO -->Speed increased from 8400>8800  Significant Events on VAD Support:  09/21/16 GIB, Ramp echo speed decreased to 8400  01/08/17 - LOW FLOW alarms; speed increased to 8800 RPM 05/20/17>speed decreased to 8600 06/18/19 - DC-CV of AF   Had recurrent AF on device interrogation and amio increased to 200 bid. Did not tolerate well with increased low flows on VAD. Underwent successful DC-CV on 06/18/19.  In 12/20 found to have severe L1 compression fracture. Saw Brianna. Lorin Hanson in Ortho. Improved slowly.  Saw Brianna. Lorin Hanson again in 11/24/19 with worsening pain. Found to have new L3 compression fx. Treated conservatively. Unable to tolerate narcotic pain meds so taking tramadol with only mild improvement.   We saw her this am in [facility] and was having severe back pain and unable to eat much because she felt so bad. We added phenergan to help with n/v and I reached out to Brianna. Lorin Hanson. Throughout the day, back pain and n/v got worse and she developed severe ab pain radiating into epigastrum. Unable to eat. No hematemesis or melena. No rebound. Feels very weak. Denies bowel/bladder incontinence, leg weakness or other neuropathic signs/sx. Presented to ER due to persistent pain and anorexia.  Review of Systems: [y] = yes, [ ]  = no    General: Weight gain [] ; Weight  loss [ y]; Anorexia Blue.Reese ]; Fatigue [y]; Fever [ ] ; Chills [ ] ; Weakness Blue.Reese ]   Cardiac: Chest pain/pressure [ ] ; Resting SOB [ y]; Exertional SOB [y]; Orthopnea [ ] ; Pedal Edema [] ; Palpitations [ ] ; Syncope [ ] ; Presyncope [ ] ; Paroxysmal nocturnal dyspnea[ ]    Pulmonary: Cough [ ] ; Wheezing[ ] ; Hemoptysis[ ] ; Sputum [ ] ; Snoring [ ]    GI: Vomiting[y ]; Dysphagia[ ] ; Melena[ ] ; Hematochezia [ ] ; Heartburn[ ] ; Abdominal pain [ y]; Constipation [ ] ; Diarrhea [ ] ; BRBPR [ ]    GU: Hematuria[ ] ; Dysuria [ ] ; Nocturia[ ]   Vascular: Pain in legs with walking [ ] ; Pain in feet with lying flat [ ] ; Non-healing sores [ ] ; Stroke [ ] ; TIA [ ] ; Slurred speech [ ] ;   Neuro: Headaches[ ] ; Vertigo[ ] ; Seizures[ ] ; Paresthesias[ ] ;Blurred vision [ ] ; Diplopia [ ] ; Vision changes [ ]    Ortho/Skin: Arthritis [y]; Joint pain [y]; Muscle pain Blue.Reese ]; Joint swelling [ ] ; Back Pain [ y]; Rash [ ]    Psych: Depression[ ] ; Anxiety[ ]    Heme: Bleeding problems [ ] ; Clotting disorders [ ] ; Anemia [ ]    Endocrine: Diabetes [ ] ; Thyroid dysfunction[ ]     VAD Indication: Destination Therapy - age excluding and patient choice    VAD interrogation & Equipment Management: Speed: 8600 Flow: 3.9 Power: 4.3w PI: 6.7   Alarms: No low flows today  Events: No PI events since earlier today  Fixed speed 8600 Low speed limit: 8000  Primary Controller: Replace back up battery in 9 months. Back up controller: Replace back up battery in17 months   Exit Site Care: Drive line is being maintained [employer]. Drive line exit site well healed and incorporated. The velour is fully implanted at exit site. Dressing dry and intact. No erythema or drainage. Stabilization device present and accurately applied. Pt denies fever or chills. Pt provided with 8 weekly dressing kits.   Significant Events on VAD Support:  09/21/16 GIB, Ramp echo speed decreased to 8400  01/08/17 - LOW FLOW alarms; speed  increased to 8800 RPM 05/20/17>speed decreased to 8600     Past Medical History:  Diagnosis Date  . AICD (automatic cardioverter/defibrillator) present   . Anemia   . Atrial fibrillation or flutter   . Cardiac arrest - ventricular fibrillation 12/10   with successful resucitation, S/p ICD  . CHF (congestive heart failure) ([place])   . Diverticula of colon 2011  . HTN (hypertension)    moderate  . ICD (implantable cardiac defibrillator) in place    she has received appropriate therapy for VF  . Internal and external hemorrhoids without complication [id number]  . Nonischemic cardiomyopathy ([place])    followed by Brianna Hanson at [facility]  . Osteopenia   . Plasma cell disorder 03/20/2012  . Plasma cell disorder 03/20/2012  . Sessile colonic polyp 2011   Brianna Brianna Hanson    Current Facility-Administered Medications  Medication Dose Route Frequency Provider Last Rate Last Admin  . fentaNYL (SUBLIMAZE) injection 25 mcg  25 mcg Intravenous Once Brianna Dessert, MD      . metoCLOPramide (REGLAN) injection 10 mg  10 mg Intravenous Once Brianna Dessert, MD      . ondansetron (ZOFRAN) injection 4 mg  4 mg Intravenous Once Hanson, Whitney, MD      . sodium chloride 0.9 % bolus 500 mL  500 mL Intravenous Once Brianna Dessert, MD       Current Outpatient Medications  Medication Sig Dispense Refill  . amiodarone (PACERONE) 200 MG tablet Take 1 tablet (200 mg total) by mouth daily. 90 tablet 3  . carvedilol (COREG) 6.25 MG tablet TAKE 1 TABLET BY MOUTH 2 TIMES DAILY WITH A MEAL 180 tablet 3  . Cholecalciferol (VITAMIN D3) 25 MCG (1000 UT) CAPS Take 1,000 Units by mouth daily.     . cyclobenzaprine (FLEXERIL) 5 MG tablet TAKE 1 TABLET BY MOUTH TWICE A DAY AS NEEDED FOR MUSCLE SPASM (Patient not taking: Reported on 12/22/2019) 60 tablet 0  . docusate sodium (COLACE) 100 MG capsule Take 100 mg by mouth daily.    . ferrous sulfate 325 (65 FE) MG tablet Take 1 tablet (325 mg total) by mouth 2 (two) times daily  with a meal. 60 tablet 6  . furosemide (LASIX) 20 MG tablet TAKE 1 TABLET BY MOUTH EVERY DAY AS NEEDED ONLY WHEN RECOMMENDED (Patient not taking: Reported on 12/22/2019) 90 tablet 2  . gabapentin (NEURONTIN) 100 MG capsule Take 3 capsules (300 mg total) by mouth 2 (two) times daily. (Patient taking differently: Take 200 mg by mouth 2 (two) times daily. ) 180 capsule 3  . hydrALAZINE (APRESOLINE) 100 MG tablet TAKE 1 TABLET BY MOUTH THREE TIMES A DAY 270 tablet 9  . losartan (COZAAR) 100 MG tablet Take 1 tablet (100 mg total) by mouth daily. 90 tablet 3  . ondansetron (ZOFRAN) 4 MG tablet Take 1 tablet (4 mg total) by mouth every 8 (eight) hours  as needed for nausea or vomiting. 50 tablet 4  . pantoprazole sodium (PROTONIX) 40 mg/20 mL PACK Take 20 mg by mouth daily.    . potassium chloride SA (KLOR-CON M20) 20 MEQ tablet Take 1 tablet (20 mEq total) by mouth 2 (two) times daily. 180 tablet 3  . promethazine (PHENERGAN) 12.5 MG tablet Take 1 tablet (12.5 mg total) by mouth every 6 (six) hours as needed for nausea or vomiting. 30 tablet 4  . traMADol (ULTRAM) 50 MG tablet Take 2 tablets (100 mg total) by mouth every 12 (twelve) hours as needed for moderate pain. 60 tablet 3  . warfarin (COUMADIN) 2.5 MG tablet Take 1.25 mg (1/2 tab) every Friday and 2.5 mg (1 tab) all other days or as directed by heart failure clinic. 180 tablet 3   Allergies  Allergen Reactions  . Oxycodone Other (See Comments)    Hallucinations     Review of systems complete and found to be negative unless listed in HPI.     Vital Signs:  Temp:  Automatc BP: 130/90 HR: 76 SPO2:  97% on RA   Exam: General:  Uncomfortable. Weak appearing General:  NAD.  HEENT: normal  Neck: supple. JVP not elevated.  Carotids 2+ bilat; no bruits. No lymphadenopathy or thryomegaly appreciated. Cor: LVAD hum.  Lungs: Clear. Abdomen: obese soft, minimally tender, non-distended. No hepatosplenomegaly. No bruits or masses. Good bowel  sounds. Driveline site clean. Anchor in place.  Extremities: no cyanosis, clubbing, rash. Warm no edema  Neuro: alert & oriented x 3. No focal deficits. Moves all 4 without problem    ASSESSMENT AND PLAN:  1) Ab pain in setting of intractable n/v due to her back pain and pain meds - abdomen is benign on exam - will give reglan and IV zofran - try low-dose fentanyl to see if she can tolerate - KUB pending  2) Low back and left hip pain in setting of L1 compression fracture and new L3 fracture - Following with Brianna. Lorin Hanson. Options limited - I spoke with Brianna. Lorin Hanson and given pain persisting > 1 month would consider L3 vertebroplasty with IR. We will consult - Try intranasal calcitonin and low-dose fentanyl  - Can use brace if will fit around VAD driveline - Will need PT/OT eventually  3) Anorexia - has received 500cc NS bolus in ER - I will give D5 1/2NS at 75cc x 12 hours - continue Boost   4) Chronic systolic HF: NICM, s/p ICD and LVAD for DT(04/2013).  - now 6+ years out on VAD support - Stable. Typically NYHA II but now IIIb in setting of ortho issues - Volume status ok.  - No change  5) LVAD placed for DT 04/2013:  - Failed Entresto due to diuresis and incessant low flow alarms.  - Continue losartan 100 mg daily.  - VAD interrogated personally. Parameters stable. - No ASA with GI bleed and intolerance.  - LDH 203 - INR 3.6 in setting of poor po intake. Goal 1.8-2.3. Hold coumadin tonight pending possible procedures. Watch INR with Boost (has vit k)  6) PAF:  - Did not tolerate AF well at all.  - S/p DC-CV 06/18/19 - Remains in NSR tody on amio 200 daily.  - Management of warfarin as above - no change  7) HTN:  - Blood pressure high due to pain. Continue losartan can use PRN hydralazine for MAP > 100  8) Chronic Anticoagulation: .   - Goal INR 1.8-2.3.  - Plan  as above  Glori Bickers, MD 7:14 PM

## 2019-12-22 NOTE — Patient Instructions (Signed)
1. No medication changes today 2. Coumadin dosing per Ander Purpura PharmD 3. Return to [facility] in 6 weeks 4. Refill sent to pharmacy for Zofran, Phenergan, and Tramadol

## 2019-12-22 NOTE — ED Provider Notes (Signed)
[facility] EMERGENCY DEPARTMENT Provider Note   CSN: [id number] Arrival date & time: 12/22/19  1826     History No chief complaint on file.   Brianna Hanson is a 85 y.o. female.  Patient is an 85 year old female with a past medical history of hypertension, CHF, A. fib, LVAD for the last 7 years, recurrent lumbar compression fractures who is presenting today from home with persistent nausea and vomiting.  Husband reports that patient had a compression fracture in November and was having a hard time getting over it but then now has gotten a second compression fracture in the last few weeks and she has had a hard time coping with the pain.  She has continued to vomit and vomited all day yesterday and today and has not been able to hold anything down.  Patient has not had any cough, shortness of breath, diarrhea or fever.  She does report some discomfort in her lower chest and epigastric area after all the vomiting.  She has not had any hematemesis.  Patient was seen at the cardiology clinic today and was found to have an INR of 3.6 with stable creatinine and hemoglobin.  Patient's LDH was mildly elevated.  She did go home and tried the new medication for nausea which did not help and she continued to vomit.  The history is provided by the patient and the spouse.       Past Medical History:  Diagnosis Date  . AICD (automatic cardioverter/defibrillator) present   . Anemia   . Atrial fibrillation or flutter   . Cardiac arrest - ventricular fibrillation 12/10   with successful resucitation, S/p ICD  . CHF (congestive heart failure) ([place])   . Diverticula of colon 2011  . HTN (hypertension)    moderate  . ICD (implantable cardiac defibrillator) in place    she has received appropriate therapy for VF  . Internal and external hemorrhoids without complication [id number]  . Nonischemic cardiomyopathy ([place])    followed by Dr April Holding at [facility]  . Osteopenia   . Plasma cell  disorder 03/20/2012  . Plasma cell disorder 03/20/2012  . Sessile colonic polyp 2011   Dr Benson Norway    Patient Active Problem List   Diagnosis Date Noted  . Nausea and vomiting 12/22/2019  . Intractable nausea and vomiting 12/22/2019  . Closed compression fracture of L3 lumbar vertebra, initial encounter ([place]) 11/24/2019  . Primary osteoarthritis of both hips 09/07/2019  . Acute hip pain, bilateral 09/06/2019  . Osteoporosis with pathological fracture 09/06/2019  . Lumbar compression fracture ([place]) 08/13/2019  . Acute left-sided low back pain with left-sided sciatica 08/04/2019  . Closed wedge compression fracture of first lumbar vertebra ([place]) 08/04/2019  . Hyperlipidemia LDL goal <130 03/04/2018  . Eczema of lower extremity 03/04/2018  . Diverticulosis of colon with hemorrhage   . Primary osteoarthritis of left knee 09/05/2016  . Iron deficiency anemia 07/11/2015  . Chronic anticoagulation 11/24/2013  . Chronic systolic congestive heart failure ([place]) 05/01/2013  . Abdominal pain 04/18/2013  . LVAD (left ventricular assist device) present ([place]) 04/07/2013  . Right heart failure ([place]) 03/29/2013  . PAH (pulmonary artery hypertension) ([place]) 03/29/2013  . Plasma cell disorder 03/20/2012  . Long term current use of anticoagulant therapy 02/14/2012  . Other abnormal glucose 02/03/2012  . Chronic renal insufficiency, stage II (mild) 02/03/2012  . Atrial fibrillation ([place]) 11/16/2010  . Essential hypertension, benign 05/13/2007  . CARDIOMYOPATHY, PRIMARY NEC 05/13/2007  . OSTEOPENIA 05/13/2007  Past Surgical History:  Procedure Laterality Date  . ABDOMINAL HYSTERECTOMY    . CARDIAC CATHETERIZATION    . CARDIAC DEFIBRILLATOR PLACEMENT     by Greggory Brandy for secondary prevention of sudden death  . CARDIOVERSION N/A 2019/07/18   Procedure: CARDIOVERSION;  Surgeon: Jolaine Artist, MD;  Location: [facility] ENDOSCOPY;  Service: Cardiovascular;  Laterality: N/A;  . CHOLECYSTECTOMY N/A 10/09/2015    Procedure: LAPAROSCOPIC CHOLECYSTECTOMY;  Surgeon: Rolm Bookbinder, MD;  Location: [place];  Service: General;  Laterality: N/A;  . COLONOSCOPY W/ POLYPECTOMY  2011   Dr Benson Norway  . INSERTION OF IMPLANTABLE LEFT VENTRICULAR ASSIST DEVICE N/A 04/06/2013   Procedure: INSERTION OF IMPLANTABLE LEFT VENTRICULAR ASSIST DEVICE;  Surgeon: Gaye Pollack, MD;  Location: [place];  Service: Open Heart Surgery;  Laterality: N/A;  . INTRAOPERATIVE TRANSESOPHAGEAL ECHOCARDIOGRAM N/A 04/06/2013   Procedure: INTRAOPERATIVE TRANSESOPHAGEAL ECHOCARDIOGRAM;  Surgeon: Gaye Pollack, MD;  Location: [place] OR;  Service: Open Heart Surgery;  Laterality: N/A;  . IR GENERIC HISTORICAL  09/24/2016   IR US GUIDE VASC ACCESS RIGHT 09/24/2016 Greggory Keen, MD [facility]INTER[facility] RAD  . IR GENERIC HISTORICAL  09/24/2016   IR ANGIOGRAM VISCERAL SELECTIVE 09/24/2016 Greggory Keen, MD [facility]  . IR GENERIC HISTORICAL  09/24/2016   IR ANGIOGRAM VISCERAL SELECTIVE 09/24/2016 Greggory Keen, MD [facility]  . PLACEMENT OF CENTRIMAG VENTRICULAR ASSIST DEVICE Right 04/06/2013   Procedure: PLACEMENT OF CENTRIMAG VENTRICULAR ASSIST DEVICE;  Surgeon: Gaye Pollack, MD;  Location: [place];  Service: Open Heart Surgery;  Laterality: Right;     OB History   No obstetric history on file.     Family History  Problem Relation Age of Onset  . Stroke Mother   . Stroke Brother     Social History   Tobacco Use  . Smoking status: Never Smoker  . Smokeless tobacco: Never Used  . Tobacco comment: remote history of tobacco abuse  Substance Use Topics  . Alcohol use: Yes    Comment: occsionally wine  . Drug use: No    Home Medications Prior to Admission medications   Medication Sig Start Date End Date Taking? Authorizing Provider  amiodarone (PACERONE) 200 MG tablet Take 1 tablet (200 mg total) by mouth daily. 05/31/19   [name], Shaune Pascal, MD  carvedilol (COREG) 6.25 MG tablet TAKE 1 TABLET BY MOUTH 2 TIMES DAILY WITH A MEAL 08/16/19   [name],  Shaune Pascal, MD  Cholecalciferol (VITAMIN D3) 25 MCG (1000 UT) CAPS Take 1,000 Units by mouth daily.     [provider]  cyclobenzaprine (FLEXERIL) 5 MG tablet TAKE 1 TABLET BY MOUTH TWICE A DAY AS NEEDED FOR MUSCLE SPASM Patient not taking: Reported on 12/22/2019 08/16/19   [name], Shaune Pascal, MD  docusate sodium (COLACE) 100 MG capsule Take 100 mg by mouth daily.    [provider]  ferrous sulfate 325 (65 FE) MG tablet Take 1 tablet (325 mg total) by mouth 2 (two) times daily with a meal. 07/11/15   [name], Shaune Pascal, MD  furosemide (LASIX) 20 MG tablet TAKE 1 TABLET BY MOUTH EVERY DAY AS NEEDED ONLY WHEN RECOMMENDED Patient not taking: Reported on 12/22/2019 12/07/19   [name], Shaune Pascal, MD  gabapentin (NEURONTIN) 100 MG capsule Take 3 capsules (300 mg total) by mouth 2 (two) times daily. Patient taking differently: Take 200 mg by mouth 2 (two) times daily.  10/25/19   [name], Shaune Pascal, MD  hydrALAZINE (APRESOLINE) 100 MG tablet TAKE 1 TABLET BY MOUTH THREE  TIMES A DAY 11/11/19   [name], Shaune Pascal, MD  losartan (COZAAR) 100 MG tablet Take 1 tablet (100 mg total) by mouth daily. 06/15/19 09/13/19  [name], Shaune Pascal, MD  ondansetron (ZOFRAN) 4 MG tablet Take 1 tablet (4 mg total) by mouth every 8 (eight) hours as needed for nausea or vomiting. 12/22/19   [name], Shaune Pascal, MD  pantoprazole sodium (PROTONIX) 40 mg/20 mL PACK Take 20 mg by mouth daily.    [provider]  potassium chloride SA (KLOR-CON M20) 20 MEQ tablet Take 1 tablet (20 mEq total) by mouth 2 (two) times daily. 08/16/19   [name], Shaune Pascal, MD  promethazine (PHENERGAN) 12.5 MG tablet Take 1 tablet (12.5 mg total) by mouth every 6 (six) hours as needed for nausea or vomiting. 12/22/19   [name], Shaune Pascal, MD  traMADol (ULTRAM) 50 MG tablet Take 2 tablets (100 mg total) by mouth every 12 (twelve) hours as needed for moderate pain. 12/22/19   [name], Shaune Pascal, MD  warfarin (COUMADIN) 2.5 MG tablet  Take 1.25 mg (1/2 tab) every Friday and 2.5 mg (1 tab) all other days or as directed by heart failure clinic. 12/21/19   [name], Shaune Pascal, MD    Allergies    Oxycodone  Review of Systems   Review of Systems  All other systems reviewed and are negative.   Physical Exam Updated Vital Signs BP 130/90 (BP Location: Right Arm)   Pulse 74   Temp 98.5 F (36.9 C) (Oral)   Resp 18   Ht 5' (1.524 m)   Wt 74.8 kg   SpO2 95%   BMI 32.21 kg/m   Physical Exam Vitals and nursing note reviewed.  Constitutional:      General: She is not in acute distress.    Appearance: She is well-developed. She is ill-appearing.  HENT:     Head: Normocephalic and atraumatic.     Mouth/Throat:     Mouth: Mucous membranes are moist.  Eyes:     Pupils: Pupils are equal, round, and reactive to light.  Cardiovascular:     Heart sounds: No murmur. No friction rub.     Comments: Hum of the LVAD heard in the chest Pulmonary:     Effort: Pulmonary effort is normal.     Breath sounds: Normal breath sounds. No wheezing or rales.  Abdominal:     General: Bowel sounds are normal. There is no distension.     Palpations: Abdomen is soft.     Tenderness: There is abdominal tenderness. There is no guarding or rebound.     Comments: Minimal epigastric tenderness on exam.  LVAD tubing is in place without surrounding erythema or drainage.  No tenderness around the tubing sites.  Musculoskeletal:        General: No tenderness. Normal range of motion.     Right lower leg: No edema.     Left lower leg: No edema.     Comments: No edema  Skin:    General: Skin is warm and dry.     Findings: No rash.  Neurological:     Mental Status: She is alert and oriented to person, place, and time.     Cranial Nerves: No cranial nerve deficit.  Psychiatric:        Mood and Affect: Mood normal.        Behavior: Behavior normal.        Thought Content: Thought content normal.      ED Results /  Procedures / Treatments     Labs (all labs ordered are listed, but only abnormal results are displayed) Labs Reviewed - No data to display  EKG EKG Interpretation  Date/Time:  Wednesday December 22 2019 18:32:29 EDT Ventricular Rate:  83 PR Interval:  184 QRS Duration: 88 QT Interval:  364 QTC Calculation: 427 R Axis:   -59 Text Interpretation: Sinus rhythm with frequent Premature ventricular complexes Left axis deviation Minimal voltage criteria for LVH, may be normal variant ( Cornell product ) Nonspecific T wave abnormality No significant change since last tracing Confirmed by Blanchie Dessert [phone]) on 12/22/2019 7:45:09 PM   Radiology DG Chest Port 1 View  Result Date: 12/22/2019 CLINICAL DATA:  Chest pain, left ventricular assist device EXAM: PORTABLE CHEST 1 VIEW COMPARISON:  12/25/2016 FINDINGS: Single frontal view of the chest demonstrates stable AICD, median sternotomy wires, and left ventricular assist device. Cardiac silhouette is stable. Chronic vascular congestion without airspace disease, effusion, or pneumothorax. IMPRESSION: 1. Stable exam, no acute process. Electronically Signed   By: Randa Ngo M.D.   On: 12/22/2019 19:16    Procedures Procedures (including critical care time)  Medications Ordered in ED Medications  metoCLOPramide (REGLAN) injection 10 mg (has no administration in time range)  sodium chloride 0.9 % bolus 500 mL (has no administration in time range)  ondansetron (ZOFRAN) injection 4 mg (has no administration in time range)  fentaNYL (SUBLIMAZE) injection 25 mcg (has no administration in time range)    ED Course  I have reviewed the triage vital signs and the nursing notes.  Pertinent labs & imaging results that were available during my care of the patient were reviewed by me and considered in my medical decision making (see chart for details).    MDM Rules/Calculators/A&P                      Elderly female with multiple medical problems presenting today with  persistent vomiting.  Patient does have an LVAD but there is no significant findings on her EKG today and low suspicion that this is cardiac in origin.  Spoke with [name] patient's primary cardiologist and he feels that her vomiting is most likely related to ongoing pain from a new compression fracture in her lumbar spine.  She has had no hematemesis and has some mild epigastric discomfort but is otherwise stable.  Blood pressure is stable.  Will give IV fluids and nausea medication.  LVAD team plans on admitting the patient for observation and close monitoring.  Also requested that she get a small dose of pain control.  Chest x-ray today is within normal limits and labs done earlier today in clinic without acute findings other than a supratherapeutic INR of 3.6.  MDM Number of Diagnoses or Management Options   Amount and/or Complexity of Data Reviewed Clinical lab tests: reviewed Tests in the radiology section of CPT: ordered and reviewed Tests in the medicine section of CPT: reviewed and ordered Discussion of test results with the performing providers: yes Decide to obtain previous medical records or to obtain history from someone other than the patient: yes Obtain history from someone other than the patient: yes Review and summarize past medical records: yes Discuss the patient with other providers: yes Independent visualization of images, tracings, or specimens: yes  Risk of Complications, Morbidity, and/or Mortality Presenting problems: moderate Diagnostic procedures: minimal Management options: low  Patient Progress Patient progress: stable  Final Clinical Impression(s) / ED Diagnoses Final diagnoses:  Non-intractable vomiting with nausea, unspecified vomiting type  Compression fracture of L3 vertebra with delayed healing, subsequent encounter  Dehydration    Rx / DC Orders ED Discharge Orders    None       Blanchie Dessert, MD 12/22/19 1946

## 2019-12-22 NOTE — Progress Notes (Signed)
LVAD Clinic Note  Patient ID: Brianna Hanson, female   DOB: 29-Aug-1935, 85 y.o.   MRN: [id number] Primary Heart Failure: Dr Haroldine Laws Cardiac Surgeon: Dr Prescott Gum  HPI: Brianna Hanson is a 85 y.o. 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 [name] (follwoed q 6 months). Underwent implantation of the HeartMate II LVAD on 04/06/13 for DT. She is not on aspirin due to dizziness. S/P lap cholecystitis 10/2015.   GI Issues 09/2016 LGIB. Angio- no source of bleeding. LVAD speed turned down to 8400  LVAD Speed Change 12/2016 Ramp ECHO -->Speed increased from 8400>8800  Significant Events on VAD Support:  09/21/16 GIB, Ramp echo speed decreased to 8400  01/08/17 - LOW FLOW alarms; speed increased to 8800 RPM 05/20/17>speed decreased to 8600 06/18/19 - DC-CV of AF   Had recurrent AF on device interrogation and amio increased to 200 bid. Did not tolerate well with increased low flows on VAD. Underwent successful DC-CV on 06/18/19.  In 12/20 found to have severe L1 compression fracture. Saw Dr. Lorin Mercy in Ortho. Improved slowly. Saw Dr. Lorin Mercy again in 11/24/19 with worsening pain. Found to have new L3 compression fx. Treated conservatively.   She presents today for routine VAD f/u. At last visit appeared to have self-limited diverticular bleed. Which has resolved. Continues to have debilitating back pain. Cannot get comfortable. Denies bowel or bladder incontinence or progressive leg weakness. Poor appetite. + nausea. Not eating well. Denies orthopnea or PND. No fevers, chills or problems with driveline. No bleeding, melena or neuro symptoms. No VAD alarms. Taking all meds as prescribed. Drinking 1 Boost per day    VAD Indication: Destination Therapy - age excluding and patient choice    VAD interrogation & Equipment Management: Speed: 8600 Flow: 3.8 Power: 4.2w PI: 6.4  Alarms: LOW FLOWS- several days  with 1-35 LFs; then some days without any Events: 20 - 30 PI events daily  Fixed speed 8600 Low speed limit: 8000  Primary Controller: Replace back up battery in 9 months. Back up controller: Replace back up battery in17 months   Exit Site Care: Drive line is being maintained [employer]. Drive line exit site well healed and incorporated. The velour is fully implanted at exit site. Dressing dry and intact. No erythema or drainage. Stabilization device present and accurately applied. Pt denies fever or chills. Pt provided with 8 weekly dressing kits.   Significant Events on VAD Support:  09/21/16 GIB, Ramp echo speed decreased to 8400  01/08/17 - LOW FLOW alarms; speed increased to 8800 RPM 05/20/17>speed decreased to 8600     Past Medical History:  Diagnosis Date  . AICD (automatic cardioverter/defibrillator) present   . Anemia   . Atrial fibrillation or flutter   . Cardiac arrest - ventricular fibrillation 12/10   with successful resucitation, S/p ICD  . CHF (congestive heart failure) ([place])   . Diverticula of colon 2011  . HTN (hypertension)    moderate  . ICD (implantable cardiac defibrillator) in place    she has received appropriate therapy for VF  . Internal and external hemorrhoids without complication [id number]  . Nonischemic cardiomyopathy ([place])    [facility] Dr April Holding at [facility]  . Osteopenia   . Plasma cell disorder 03/20/2012  . Plasma cell disorder 03/20/2012  . Sessile colonic polyp 2011   Dr Benson Norway    Current Outpatient Medications  Medication Sig Dispense Refill  . amiodarone (PACERONE) 200 MG tablet Take 1 tablet (200 mg total) by mouth daily. 90 tablet 3  . carvedilol (COREG) 6.25 MG tablet TAKE 1 TABLET BY MOUTH 2 TIMES DAILY WITH A MEAL 180 tablet 3  . Cholecalciferol (VITAMIN D3) 25 MCG (1000 UT) CAPS Take 1,000 Units by mouth daily.     [name] docusate sodium (COLACE) 100 MG capsule Take 100 mg by mouth daily.    . ferrous sulfate 325 (65 FE) MG  tablet Take 1 tablet (325 mg total) by mouth 2 (two) times daily with a meal. 60 tablet 6  . gabapentin (NEURONTIN) 100 MG capsule Take 3 capsules (300 mg total) by mouth 2 (two) times daily. (Patient taking differently: Take 200 mg by mouth 2 (two) times daily. ) 180 capsule 3  . hydrALAZINE (APRESOLINE) 100 MG tablet TAKE 1 TABLET BY MOUTH THREE TIMES A DAY 270 tablet 9  . pantoprazole sodium (PROTONIX) 40 mg/20 mL PACK Take 20 mg by mouth daily.    . potassium chloride SA (KLOR-CON M20) 20 MEQ tablet Take 1 tablet (20 mEq total) by mouth 2 (two) times daily. 180 tablet 3  . traMADol (ULTRAM) 50 MG tablet Take 2 tablets (100 mg total) by mouth every 12 (twelve) hours as needed for moderate pain. 60 tablet 3  . warfarin (COUMADIN) 2.5 MG tablet Take 1.25 mg (1/2 tab) every Friday and 2.5 mg (1 tab) all other days or as directed by heart failure clinic. 180 tablet 3  . cyclobenzaprine (FLEXERIL) 5 MG tablet TAKE 1 TABLET BY MOUTH TWICE A DAY AS NEEDED FOR MUSCLE SPASM (Patient not taking: Reported on 12/22/2019) 60 tablet 0  . furosemide (LASIX) 20 MG tablet TAKE 1 TABLET BY MOUTH EVERY DAY AS NEEDED ONLY WHEN RECOMMENDED (Patient not taking: Reported on 12/22/2019) 90 tablet 2  . losartan (COZAAR) 100 MG tablet Take 1 tablet (100 mg total) by mouth daily. 90 tablet 3  . ondansetron (ZOFRAN) 4 MG tablet Take 1 tablet (4 mg total) by mouth every 8 (eight) hours as needed for nausea or vomiting. 50 tablet 4  . promethazine (PHENERGAN) 12.5 MG tablet Take 1 tablet (12.5 mg total) by mouth every 6 (six) hours as needed for nausea or vomiting. 30 tablet 4   No current facility-administered medications for this encounter.   Allergies  Allergen Reactions  . Oxycodone Other (See Comments)    Hallucinations     Review of systems complete and found to be negative unless listed in HPI.     Vital Signs:  Temp:  Doppler Pressure:  130 Automatc BP: 105/63 (85) HR: 76 SPO2:  98% on RA  Weight: 165.6  lb w/o eqt (UTO today due to back pain) Last weight: 166.8 lbs   Exam: General:  Uncomfortable. Weak appearing HEENT: normal  Neck: supple. JVP not elevated.  Carotids 2+ bilat; no bruits. No lymphadenopathy or thryomegaly appreciated. Cor: LVAD hum.  Lungs: Clear. Abdomen: soft, nontender, non-distended. No hepatosplenomegaly. No bruits or masses. Good bowel sounds. Driveline site clean. Anchor in place.  Extremities: no cyanosis, clubbing, rash. Warm no edema  Neuro: alert & oriented x 3. No focal deficits. Moves all 4 without problem    ASSESSMENT AND PLAN:  1) Low back and left hip pain in setting of L1 compression fracture and new L3 fracture - Following with Dr. Lorin Mercy. Options limited - Given severe pain, I will reach out to Dr. Lorin Mercy again today to see if  any other options - Taking tramadol without effective releif. Unable to tolerate narcotics.  - Pain leading to nausea and po poor intake. Will give po zofran +/- phenergan. Increase Boost to 3x/day  2) Chronic systolic HF: NICM, s/p ICD and LVAD for DT(04/2013).  - now 6+ years out on VAD support - Stable. Typically NYHA II but now IIIb in setting of ortho issues - Volume status ok. Continue to push fluids and Boost ntake  3) LVAD placed for DT 04/2013:  - Failed Entresto due to diuresis and incessant low flow alarms.  - Continue losartan 100 mg daily.  - VAD interrogated personally. Parameters stable. - No ASA with GI bleed and intolerance.  - Continue coumadin. No bleeding.  - LDH 203 - INR 3.6 in setting of poor po intake. Increasing Boost  Discussed dosing with PharmD personally..Try to keep 1.8-2.3  4) PAF:  - Did not tolerate AF well at all.  - S/p DC-CV 06/18/19 - Remains in NSR tody on amio 200 daily.  - Continue warfarin - no change  5) HTN:  - Blood pressure well controlled. Continue current regimen.  6) Chronic Anticoagulation:  - As above.   - Goal INR 1.8-2.3.  - INR 2.8 Discussed dosing with  PharmD personally.  7) Recent BRBPR - suspect diverticulosis. Had episode in 09/2016.  - 12/2009 Colonoscopy, Dr Benson Norway: descending adenoma and pan-diverticulosis > in sigmoid, int/ext hemorrhoids.  - hgb 12.7 -> 11.7 -> 11.4 - Bleeding has stopped - we have decided not to pursue repeat colonoscopy at this time given age and comorbidities (particualrly severe back/hip issues). If bleeding persists can revisit  Total time spent 35 minutes. Over half that time spent discussing above.    Glori Bickers, MD 1:03 PM

## 2019-12-23 ENCOUNTER — Inpatient Hospital Stay (HOSPITAL_COMMUNITY): Payer: Medicare Other

## 2019-12-23 LAB — BASIC METABOLIC PANEL
Anion gap: 11 (ref 5–15)
BUN: 6 mg/dL — ABNORMAL LOW (ref 8–23)
CO2: 23 mmol/L (ref 22–32)
Calcium: 9.5 mg/dL (ref 8.9–10.3)
Chloride: 107 mmol/L (ref 98–111)
Creatinine, Ser: 0.85 mg/dL (ref 0.44–1.00)
GFR calc Af Amer: >60 mL/min (ref >60)
GFR calc non Af Amer: >60 mL/min (ref >60)
Glucose, Bld: 111 mg/dL — ABNORMAL HIGH (ref 70–99)
Potassium: 3.9 mmol/L (ref 3.5–5.1)
Sodium: 141 mmol/L (ref 135–145)

## 2019-12-23 LAB — CBC
HCT: 35.5 % — ABNORMAL LOW (ref 36.0–46.0)
Hemoglobin: 11.1 g/dL — ABNORMAL LOW (ref 12.0–15.0)
MCH: 30.9 pg (ref 26.0–34.0)
MCHC: 31.3 g/dL (ref 30.0–36.0)
MCV: 98.9 fL (ref 80.0–100.0)
Platelets: 244 10*3/uL (ref 150–400)
RBC: 3.59 MIL/uL — ABNORMAL LOW (ref 3.87–5.11)
RDW: 14.4 % (ref 11.5–15.5)
WBC: 10 10*3/uL (ref 4.0–10.5)
nRBC: 0 % (ref 0.0–0.2)

## 2019-12-23 LAB — LIPASE, BLOOD: Lipase: 19 U/L (ref 11–51)

## 2019-12-23 LAB — PROTIME-INR
INR: 3.1 — ABNORMAL HIGH (ref 0.8–1.2)
Prothrombin Time: 31.6 seconds — ABNORMAL HIGH (ref 11.4–15.2)

## 2019-12-23 LAB — AMYLASE: Amylase: 76 U/L (ref 28–100)

## 2019-12-23 LAB — LACTATE DEHYDROGENASE: LDH: 219 U/L — ABNORMAL HIGH (ref 98–192)

## 2019-12-23 MED ORDER — METOCLOPRAMIDE HCL 5 MG/ML IJ SOLN
10.0000 mg | Freq: Once | INTRAMUSCULAR | Status: AC
  Administered 2019-12-23: 10 mg via INTRAVENOUS
  Filled 2019-12-23: qty 2

## 2019-12-23 MED ORDER — PROMETHAZINE HCL 25 MG/ML IJ SOLN
12.5000 mg | Freq: Four times a day (QID) | INTRAMUSCULAR | Status: DC | PRN
  Administered 2019-12-23 – 2019-12-27 (×2): 12.5 mg via INTRAVENOUS
  Filled 2019-12-23 (×2): qty 1

## 2019-12-23 MED ORDER — ENSURE ENLIVE PO LIQD
237.0000 mL | Freq: Two times a day (BID) | ORAL | Status: DC
  Administered 2019-12-23 – 2019-12-28 (×7): 237 mL via ORAL

## 2019-12-23 MED ORDER — CALCITONIN (SALMON) 200 UNIT/ACT NA SOLN
1.0000 | Freq: Every day | NASAL | Status: DC
  Administered 2019-12-23 – 2020-01-07 (×15): 1 via NASAL
  Filled 2019-12-23: qty 3.7

## 2019-12-23 NOTE — Progress Notes (Addendum)
Advanced Heart Failure VAD Team Note  PCP-Cardiologist: No primary care provider on file.   Subjective:   Admitted with N/V, abd pain due to lumbar compression fracture.   CT Lumbar 1. Acute to subacute compression fractures involving the T12, L1, L3, and L4 vertebral bodies as above. Associated up to 5 mm bony retropulsion at the level of L1 with no more than mild spinal stenosis. These fractures are benign/mechanical in appearance. 2. Multilevel degenerative spondylosis with resultant mild to moderate spinal stenosis at L2-3, L3-4 and L4-5, with mild to moderate lateral recess narrowing at L5-S1.  Complaining ongoing nausea and abdominal pain.     LVAD INTERROGATION:  HeartMate II LVAD:   Flow 3.6 liters/min, speed 8600, power 4, PI 6.9    Objective:    Vital Signs:   Temp:  [97.5 F (36.4 C)-98.5 F (36.9 C)] 98.3 F (36.8 C) (04/22 0332) Pulse Rate:  [62-80] 64 (04/22 0332) Resp:  [17-19] 18 (04/22 0332) BP: (112-144)/(75-96) 112/75 (04/22 0332) SpO2:  [92 %-100 %] 100 % (04/22 0332) Weight:  [73.8 kg-74.8 kg] 73.8 kg (04/22 0525) Last BM Date: 12/21/19 Mean arterial Pressure 80-90s   Intake/Output:   Intake/Output Summary (Last 24 hours) at 12/23/2019 0716 Last data filed at 12/23/2019 0400 Gross per 24 hour  Intake 407.03 ml  Output --  Net 407.03 ml     Physical Exam    General:  Appears weak.  No resp difficulty HEENT: normal Neck: supple. JVP flat . Carotids 2+ bilat; no bruits. No lymphadenopathy or thyromegaly appreciated. Cor: Mechanical heart sounds with LVAD hum present. Lungs: clear Abdomen: soft, lower abdomen ntender, nondistended. No hepatosplenomegaly. No bruits or masses. Good bowel sounds. Driveline: C/D/I; securement device intact and driveline incorporated Extremities: no cyanosis, clubbing, rash, edema Neuro: alert & orientedx3, cranial nerves grossly intact. moves all 4 extremities w/o difficulty. Affect pleasant   Telemetry    SR 60-80s Personally reviewed Glori Bickers, MD  7:48 AM    EKG    N/A  Labs   Basic Metabolic Panel: Recent Labs  Lab 12/22/19 1106 12/23/19 0314  NA 139 141  K 4.1 3.9  CL 107 107  CO2 20* 23  GLUCOSE 118* 111*  BUN 5* 6*  CREATININE 0.86 0.85  CALCIUM 9.6 9.5    Liver Function Tests: Recent Labs  Lab 12/22/19 1106  AST 25  ALT 19  ALKPHOS 120  BILITOT 0.3  PROT 9.2*  ALBUMIN 3.0*   No results for input(s): LIPASE, AMYLASE in the last 168 hours. No results for input(s): AMMONIA in the last 168 hours.  CBC: Recent Labs  Lab 12/22/19 1106 12/23/19 0314  WBC 7.2 10.0  HGB 11.4* 11.1*  HCT 37.4 35.5*  MCV 101.4* 98.9  PLT 270 244    INR: Recent Labs  Lab 12/21/19 0000 12/22/19 1106 12/23/19 0314  INR 5.1* 3.6* 3.1*    Other results:  EKG:    Imaging   CT LUMBAR SPINE WO CONTRAST  Result Date: 12/23/2019 CLINICAL DATA:  Initial evaluation for compression fracture. EXAM: CT LUMBAR SPINE WITHOUT CONTRAST TECHNIQUE: Multidetector CT imaging of the lumbar spine was performed without intravenous contrast administration. Multiplanar CT image reconstructions were also generated. COMPARISON:  Prior CT from 08/13/2019 and radiograph from 11/24/2019 FINDINGS: Segmentation: Standard. Lowest well-formed disc space labeled the L5-S1 level. Alignment: Physiologic with preservation of the normal lumbar lordosis. No significant listhesis. Vertebrae: Mildly progressive central height loss seen at the superior and inferior endplates of [id number] as  compared to previous CT, consistent with interval endplate compression fracture appearance is age indeterminate, but favored to be subacute in nature. Height loss measures no more than 20% without bony retropulsion. Severe compression deformity involving the L1 vertebral body has mildly worsened as compared to previous CT with a few scattered superimposed cortical lucencies, suggesting an acute or subacute component.  Height loss now measures up to 75% with up to 5 mm bony retropulsion. Acute to subacute compression fracture involving the superior endplate of L3 with up to 40% height loss and 3 mm bony retropulsion. Compression deformity involving the superior endplate of L4 has progressed relative to previous radiograph and CT with a few subtle cortical lucencies, suggesting an acute to subacute component. Height loss measures up to 50% with 2 mm bony retropulsion. No extension into the posterior elements seen about any of these fractures. Vertebral body height otherwise maintained with no other acute or subacute fracture identified. Visualized sacrum and pelvis intact. SI joints approximated symmetric. No discrete or worrisome osseous lesions. Underlying osteopenia noted. Paraspinal and other soft tissues: Mild scattered paraspinous edema seen adjacent to the above-mentioned compression fractures. Paraspinous soft tissues demonstrate no other acute finding. Chronic fatty atrophy noted within the posterior paraspinous musculature. Moderate aorto bi-iliac atherosclerotic disease. Few scattered renal cysts noted bilaterally. Visualized visceral structures otherwise unremarkable. Disc levels: T12-L1: 5 mm bony retropulsion related to the L1 compression fracture. Resultant mild canal with bilateral foraminal stenosis. L1-2: Minimal disc bulge. No significant canal or foraminal stenosis. L2-3: Mild diffuse disc bulge. Superimposed 3 mm bony retropulsion related to the L3 compression fracture. Superimposed bilateral facet hypertrophy. Resultant moderate spinal stenosis. Foramina remain patent. L3-4: Minimal disc bulge. Moderate bilateral facet hypertrophy. Trace 2 mm bony retropulsion related to the L4 compression deformity. Resultant moderate spinal stenosis. Foramina remain patent. L4-5: Diffuse disc bulge with associated annular calcification. Facet and ligament flavum hypertrophy. Resultant mild to moderate canal with bilateral  subarticular stenosis. Mild bilateral L4 foraminal narrowing. L5-S1: Diffuse disc bulge. Moderate facet hypertrophy. Mild to moderate left greater than right lateral recess stenosis. Foramina remain patent. IMPRESSION: 1. Acute to subacute compression fractures involving the T12, L1, L3, and L4 vertebral bodies as above. Associated up to 5 mm bony retropulsion at the level of L1 with no more than mild spinal stenosis. These fractures are benign/mechanical in appearance. 2. Multilevel degenerative spondylosis with resultant mild to moderate spinal stenosis at L2-3, L3-4 and L4-5, with mild to moderate lateral recess narrowing at L5-S1. 3.  Aortic Atherosclerosis (ICD10-I70.0). Electronically Signed   By: Jeannine Boga M.D.   On: 12/23/2019 01:47   DG Chest Port 1 View  Result Date: 12/22/2019 CLINICAL DATA:  Chest pain, left ventricular assist device EXAM: PORTABLE CHEST 1 VIEW COMPARISON:  12/25/2016 FINDINGS: Single frontal view of the chest demonstrates stable AICD, median sternotomy wires, and left ventricular assist device. Cardiac silhouette is stable. Chronic vascular congestion without airspace disease, effusion, or pneumothorax. IMPRESSION: 1. Stable exam, no acute process. Electronically Signed   By: Randa Ngo M.D.   On: 12/22/2019 19:16      Medications:     Scheduled Medications: . amiodarone  200 mg Oral Daily  . carvedilol  6.25 mg Oral BID WC  . docusate sodium  100 mg Oral Daily  . gabapentin  300 mg Oral BID  . hydrALAZINE  100 mg Oral TID  . losartan  100 mg Oral Daily  . pantoprazole sodium  20 mg Oral Daily  Infusions: . dextrose 5 % and 0.45 % NaCl with KCl 10 mEq/L 75 mL/hr at 12/22/19 2239     PRN Medications:  acetaminophen, fentaNYL (SUBLIMAZE) injection, ondansetron (ZOFRAN) IV, traMADol    Assessment/Plan:    1) Ab pain in setting of intractable n/v due to her back pain and pain meds - abdomen is benign on exam - will give reglan and IV  zofran. Zofran not working. Add phenergan .  - try low-dose fentanyl to see if she can tolerate  2) Low back and left hip pain in setting of L1 compression fracture and new L3 fracture - Following with Dr. Lorin Mercy. Options limited - Dr Haroldine Laws spoke with Dr. Lorin Mercy and given pain persisting > 1 month would consider L3 vertebroplasty with IR. Consult IR  - Try intranasal calcitonin and low-dose fentanyl  - Can use brace if will fit around VAD driveline - Will need PT/OT eventually  3) Anorexia - Received 500cc NS bolus in ER -Receiving D5 1/2NS at 75cc x 12 hours - continue Boost   4) Chronic systolic HF: NICM, s/p ICD and LVAD for DT(04/2013).  - now 6+ years out on VAD support -  Typically NYHA II but now IIIb in setting of ortho issues - Volume status stable. Does not need diuretics.  - Maps stable. Continue current dose of losartan, carvedilol, and hydralazine.   5) LVAD placed for DT 04/2013:  - Failed Entresto due to diuresis and incessant low flow alarms.  - Continue losartan 100 mg daily.  - VAD interrogated personally. Parameters stable. - No ASA with GI bleed and intolerance.  - LDH stable 219 - INR 3.1.  Goal 1.8-2.3. Hold coumadin for possible procedure.  - Watch INR with supplements.   6) PAF:  - Did not tolerate AF well at all.  - S/p DC-CV 06/18/19 - Remains in NSR. Continue amio 200 daily.  - Management of warfarin as above - no change  7) HTN:  Stable. Continue current regimen.   8) Chronic Anticoagulation: .   - Goal INR 1.8-2.3.  -  Plan as above  Consult IR   I reviewed the LVAD parameters from today, and compared the results to the patient's prior recorded data.  No programming changes were made.  The LVAD is functioning within specified parameters.  The patient performs LVAD self-test daily.  LVAD interrogation was negative for any significant power changes, alarms or PI events/speed drops.  LVAD equipment check completed and is in good working  order.  Back-up equipment present.   LVAD education done on emergency procedures and precautions and reviewed exit site care.  Length of Stay: 1  Darrick Grinder, NP 12/23/2019, 7:16 AM  VAD Team --- VAD ISSUES ONLY--- Pager [phone] (7am - 7am)  Advanced Heart Failure Team  Pager [phone] (M-F; 7a - 4p)  Please contact [facility] for night-coverage after hours (4p -7a ) and weekends on [email or web address]  Patient seen and examined with the above-signed Advanced Practice Provider and/or Housestaff. I personally reviewed laboratory data, imaging studies and relevant notes. I independently examined the patient and formulated the important aspects of the plan. I have edited the note to reflect any of my changes or salient points. I have personally discussed the plan with the patient and/or family.  Still with severe pain and nausea. Says pain mostly in her belly. Not worse with eating. No hematemesis or melena. Has had GB out.  Poor po intake. On IVF. Tolerating Fentanyl. Will add Phenergan and discuss  intranasal calcitonin. (Discussed dosing with PharmD personally).   General:  Uncomfortable HEENT: normal  Neck: supple. JVP not elevated.  Carotids 2+ bilat; no bruits. No lymphadenopathy or thryomegaly appreciated. Cor: LVAD hum.  Lungs: Clear. Abdomen: obese soft, mildly tender in epigastrum, non-distended. No hepatosplenomegaly. No bruits or masses. Good bowel sounds. Driveline site clean. Anchor in place.  Extremities: no cyanosis, clubbing, rash. Warm no edema  Neuro: alert & oriented x 3. No focal deficits. Moves all 4 without problem   Continue efforts at pain control. Hard to know if pain from wretching or other source. Check amylase/lipase. Doubt mallory weiss with no evidence bleeding.    HF stable. Hold warfarin. VAD interrogated personally. Parameters stable.  Will consult IR for possible vertebroplasty.   Glori Bickers, MD  7:50 AM

## 2019-12-23 NOTE — Progress Notes (Signed)
Per patient spouse is coming to change LVAD dressing.

## 2019-12-23 NOTE — Evaluation (Addendum)
Physical Therapy Evaluation Patient Details Name: Brianna Hanson MRN: [id number] DOB: [birth year] Today's Date: 12/23/2019   History of Present Illness  85 yo admitted with N/V due to pain from back with T12, L1, L3, L4 compression fx. PMHx: HF, NICM, PAF, plasma cell disorder, LVAD heartmate II 04/06/13, lap chole  Clinical Impression  Pt pleasant, fatigued and reports nausea all morning despite medication with episode of emesis at beginning of session with pt willing to progress mobility. Pt reaching out for bil HHA for transfers and would benefit from trying RW for mobility for future trials. Pt uncomfortable in chair and unable to remain up with transition back to bed. Pt with decreased strength, transfers, gait and functional mobility who will benefit from acute therapy to maximize mobility and independence. Pt educated for back precautions to assist with pain relief and pt reports 2 braces at home but none present currently.   HR 63, SpO2 96% RA during activity with drop to 88% with return to bed and reapplied 2L with sats 98% 8590 speed, 3.5 flow, index 7.0, flow 4.1    Follow Up Recommendations Home health PT    Equipment Recommendations  None recommended by PT    Recommendations for Other Services       Precautions / Restrictions Precautions Precautions: Fall;Back;Other (comment) Precaution Comments: LVAD, back precautions for comfort with all discussed Restrictions Weight Bearing Restrictions: No      Mobility  Bed Mobility Overal bed mobility: Needs Assistance Bed Mobility: Rolling;Sidelying to Sit;Sit to Sidelying Rolling: Min guard Sidelying to sit: Min guard     Sit to sidelying: Min assist General bed mobility comments: cues for sequence with increased time and tactile cues to maintain precautions for exiting bed. Return to bed assist to bring legs to surface with mod cues  Transfers Overall transfer level: Needs assistance   Transfers: Sit to/from Stand;Stand  Pivot Transfers Sit to Stand: Min assist;+2 safety/equipment Stand pivot transfers: Min assist;+2 safety/equipment       General transfer comment: min assist to rise from bed and chair, bil HHA to pivot bed<>chair. Pt in chair then declined remaining there due to pain and assisted back to bed  Ambulation/Gait             General Gait Details: pt declined due to fatigue, nausea and pain  Stairs            Wheelchair Mobility    Modified Rankin (Stroke Patients Only)       Balance Overall balance assessment: Needs assistance   Sitting balance-Leahy Scale: Fair     Standing balance support: Bilateral upper extremity supported Standing balance-Leahy Scale: Poor Standing balance comment: bil UE support in standing                             Pertinent Vitals/Pain Pain Assessment: 0-10 Pain Score: 5  Pain Location: shoulder blades Pain Descriptors / Indicators: Aching Pain Intervention(s): Limited activity within patient's tolerance;Monitored during session;Repositioned    Home Living Family/patient expects to be discharged to:: Private residence Living Arrangements: Spouse/significant other Available Help at Discharge: Family;Available 24 hours/day Type of Home: House Home Access: Stairs to enter   [employer] of Steps: 1 up to porch, 1 up to kitchen Home Layout: One level Home Equipment: Shower seat;Grab bars - tub/shower;Grab bars - toilet;Hand held Tourist information centre manager - 2 wheels Additional Comments: old LVAD    Prior Function Level of Independence: Needs assistance  Gait / Transfers Assistance Needed: not using DME  ADL's / Homemaking Assistance Needed: boyfriend assists with ADLs; boyfriend does  IADLs        Hand Dominance   Dominant Hand: Right    Extremity/Trunk Assessment   Upper Extremity Assessment Upper Extremity Assessment: Generalized weakness    Lower Extremity Assessment Lower Extremity Assessment:  Generalized weakness    Cervical / Trunk Assessment Cervical / Trunk Assessment: Kyphotic  Communication   Communication: No difficulties  Cognition Arousal/Alertness: Awake/alert Behavior During Therapy: Flat affect Overall Cognitive Status: Impaired/Different from baseline Area of Impairment: Problem solving                             Problem Solving: Slow processing        General Comments      Exercises     Assessment/Plan    PT Assessment Patient needs continued PT services  PT Problem List Decreased mobility;Decreased activity tolerance;Decreased balance;Decreased knowledge of use of DME;Pain;Decreased cognition       PT Treatment Interventions DME instruction;Therapeutic exercise;Gait training;Balance training;Stair training;Functional mobility training;Therapeutic activities;Patient/family education;Cognitive remediation    PT Goals (Current goals can be found in the Care Plan section)  Acute Rehab PT Goals Patient Stated Goal: be able to walk and get rid of back pain PT Goal Formulation: With patient Time For Goal Achievement: 01/06/20 Potential to Achieve Goals: Fair    Frequency Min 3X/week   Barriers to discharge Decreased caregiver support      Co-evaluation PT/OT/SLP Co-Evaluation/Treatment: Yes Reason for Co-Treatment: For patient/therapist safety PT goals addressed during session: Mobility/safety with mobility         AM-PAC PT "6 Clicks" Mobility  Outcome Measure Help needed turning from your back to your side while in a flat bed without using bedrails?: A Little Help needed moving from lying on your back to sitting on the side of a flat bed without using bedrails?: A Little Help needed moving to and from a bed to a chair (including a wheelchair)?: A Little Help needed standing up from a chair using your arms (e.g., wheelchair or bedside chair)?: A Little Help needed to walk in hospital room?: A Lot Help needed climbing 3-5  steps with a railing? : Total 6 Click Score: 15    End of Session   Activity Tolerance: Patient limited by fatigue Patient left: in bed;with call bell/phone within reach;with bed alarm set Nurse Communication: Mobility status PT Visit Diagnosis: Difficulty in walking, not elsewhere classified (R26.2);Other abnormalities of gait and mobility (R26.89)    Time: [id number] PT Time Calculation (min) (ACUTE ONLY): 26 min   Charges:   PT Evaluation $PT Eval Moderate Complexity: 1 Mod          [place], PT Acute Rehabilitation Services Pager: [phone] Office: [phone]   Sandy Salaam Placido Hangartner 12/23/2019, 11:23 AM

## 2019-12-23 NOTE — Consult Note (Signed)
Chief Complaint: Patient was seen in consultation today for back pain  Referring Physician(s): Dr. Jeffie Pollock  Supervising Physician: Luanne Bras  Patient Status: [facility] - In-pt  History of Present Illness: Brianna Hanson is a 85 y.o. female with past medical history of heart failure due to NICM, chronic systolic HF, a fib, MGUS who underwent LVAD placement in 2014.  She presented for routine follow-up with Dr. Jeffie Pollock yesterday and complained of back pain with nausea.  Her symptoms worsened throughout the day and she presented to the ED with refractory nausea and vomiting.    CT Lumbar Spine showed: 1. Acute to subacute compression fractures involving the T12, L1, L3, and L4 vertebral bodies as above. Associated up to 5 mm bony retropulsion at the level of L1 with no more than mild spinal stenosis. These fractures are benign/mechanical in appearance. 2. Multilevel degenerative spondylosis with resultant mild to moderate spinal stenosis at L2-3, L3-4 and L4-5, with mild to moderate lateral recess narrowing at L5-S1.  Patient assessed at bedside.  She reports severe back pain with movement and standing.  She is interested in pursuing treatment for her back pain.  She is on coumdain.  Her last dose was 4/19.   Past Medical History:  Diagnosis Date  . AICD (automatic cardioverter/defibrillator) present   . Anemia   . Atrial fibrillation or flutter   . Cardiac arrest - ventricular fibrillation 12/10   with successful resucitation, S/p ICD  . CHF (congestive heart failure) ([place])   . Diverticula of colon 2011  . HTN (hypertension)    moderate  . ICD (implantable cardiac defibrillator) in place    she has received appropriate therapy for VF  . Internal and external hemorrhoids without complication [id number]  . Nonischemic cardiomyopathy ([place])    followed by Dr April Holding at [facility]  . Osteopenia   . Plasma cell disorder 03/20/2012  . Plasma cell disorder 03/20/2012  . Sessile  colonic polyp 2011   Dr Benson Norway    Past Surgical History:  Procedure Laterality Date  . ABDOMINAL HYSTERECTOMY    . CARDIAC CATHETERIZATION    . CARDIAC DEFIBRILLATOR PLACEMENT     by Greggory Brandy for secondary prevention of sudden death  . CARDIOVERSION N/A July 08, 2019   Procedure: CARDIOVERSION;  Surgeon: Jolaine Artist, MD;  Location: [facility] ENDOSCOPY;  Service: Cardiovascular;  Laterality: N/A;  . CHOLECYSTECTOMY N/A 10/09/2015   Procedure: LAPAROSCOPIC CHOLECYSTECTOMY;  Surgeon: Rolm Bookbinder, MD;  Location: [place];  Service: General;  Laterality: N/A;  . COLONOSCOPY W/ POLYPECTOMY  2011   Dr Benson Norway  . INSERTION OF IMPLANTABLE LEFT VENTRICULAR ASSIST DEVICE N/A 04/06/2013   Procedure: INSERTION OF IMPLANTABLE LEFT VENTRICULAR ASSIST DEVICE;  Surgeon: Gaye Pollack, MD;  Location: [place];  Service: Open Heart Surgery;  Laterality: N/A;  . INTRAOPERATIVE TRANSESOPHAGEAL ECHOCARDIOGRAM N/A 04/06/2013   Procedure: INTRAOPERATIVE TRANSESOPHAGEAL ECHOCARDIOGRAM;  Surgeon: Gaye Pollack, MD;  Location: [place] OR;  Service: Open Heart Surgery;  Laterality: N/A;  . IR GENERIC HISTORICAL  09/24/2016   IR US GUIDE VASC ACCESS RIGHT 09/24/2016 Greggory Keen, MD [facility]INTER[facility] RAD  . IR GENERIC HISTORICAL  09/24/2016   IR ANGIOGRAM VISCERAL SELECTIVE 09/24/2016 Greggory Keen, MD [facility]  . IR GENERIC HISTORICAL  09/24/2016   IR ANGIOGRAM VISCERAL SELECTIVE 09/24/2016 Greggory Keen, MD [facility]  . PLACEMENT OF CENTRIMAG VENTRICULAR ASSIST DEVICE Right 04/06/2013   Procedure: PLACEMENT OF CENTRIMAG VENTRICULAR ASSIST DEVICE;  Surgeon: Gaye Pollack, MD;  Location: [place];  Service:  Open Heart Surgery;  Laterality: Right;    Allergies: Oxycodone  Medications: Prior to Admission medications   Medication Sig Start Date End Date Taking? Authorizing Provider  amiodarone (PACERONE) 200 MG tablet Take 1 tablet (200 mg total) by mouth daily. 05/31/19  Yes [name], Shaune Pascal, MD  carvedilol (COREG) 6.25 MG tablet TAKE 1  TABLET BY MOUTH 2 TIMES DAILY WITH A MEAL Patient taking differently: Take 6.25 mg by mouth 2 (two) times daily with a meal.  08/16/19  Yes [name], Shaune Pascal, MD  Cholecalciferol (VITAMIN D3) 25 MCG (1000 UT) CAPS Take 1,000 Units by mouth daily.    Yes [provider]  docusate sodium (COLACE) 100 MG capsule Take 100 mg by mouth daily.   Yes [provider]  ferrous sulfate 325 (65 FE) MG tablet Take 1 tablet (325 mg total) by mouth 2 (two) times daily with a meal. 07/11/15  Yes [name], Shaune Pascal, MD  furosemide (LASIX) 20 MG tablet TAKE 1 TABLET BY MOUTH EVERY DAY AS NEEDED ONLY WHEN RECOMMENDED Patient taking differently: Take 20 mg by mouth as needed (swelling).  12/07/19  Yes [name], Shaune Pascal, MD  gabapentin (NEURONTIN) 100 MG capsule Take 3 capsules (300 mg total) by mouth 2 (two) times daily. Patient taking differently: Take 100 mg by mouth at bedtime.  10/25/19  Yes [name], Shaune Pascal, MD  hydrALAZINE (APRESOLINE) 100 MG tablet TAKE 1 TABLET BY MOUTH THREE TIMES A DAY Patient taking differently: Take 100 mg by mouth 3 (three) times daily.  11/11/19  Yes [name], Shaune Pascal, MD  losartan (COZAAR) 100 MG tablet Take 1 tablet (100 mg total) by mouth daily. 06/15/19 12/22/19 Yes [name], Shaune Pascal, MD  ondansetron (ZOFRAN) 4 MG tablet Take 1 tablet (4 mg total) by mouth every 8 (eight) hours as needed for nausea or vomiting. 12/22/19  Yes [name], Shaune Pascal, MD  pantoprazole (PROTONIX) 40 MG tablet Take 40 mg by mouth daily. 10/04/19  Yes [provider]  potassium chloride SA (KLOR-CON M20) 20 MEQ tablet Take 1 tablet (20 mEq total) by mouth 2 (two) times daily. 08/16/19  Yes [name], Shaune Pascal, MD  promethazine (PHENERGAN) 12.5 MG tablet Take 1 tablet (12.5 mg total) by mouth every 6 (six) hours as needed for nausea or vomiting. 12/22/19  Yes [name], Shaune Pascal, MD  traMADol (ULTRAM) 50 MG tablet Take 2 tablets (100 mg total) by mouth every 12 (twelve) hours as  needed for moderate pain. 12/22/19  Yes [name], Shaune Pascal, MD  warfarin (COUMADIN) 2.5 MG tablet Take 1.25 mg (1/2 tab) every Friday and 2.5 mg (1 tab) all other days or as directed by heart failure clinic. Patient taking differently: Take 1.25-2.5 mg by mouth See admin instructions. Take 1.25 mg (1/2 tab) every Friday and 2.5 mg (1 tab) all other days or as directed by heart failure clinic. 12/21/19  Yes [name], Shaune Pascal, MD  cyclobenzaprine (FLEXERIL) 5 MG tablet TAKE 1 TABLET BY MOUTH TWICE A DAY AS NEEDED FOR MUSCLE SPASM Patient not taking: Reported on 12/22/2019 08/16/19   [name], Shaune Pascal, MD     Family History  Problem Relation Age of Onset  . Stroke Mother   . Stroke Brother     Social History   Socioeconomic History  . Marital status: Soil scientist    Spouse name: Not on file  . Number of children: 3  . Years of education: Not on file  . Highest education level: Not on file  Occupational  History  . Not on file  Tobacco Use  . Smoking status: Never Smoker  . Smokeless tobacco: Never Used  . Tobacco comment: remote history of tobacco abuse  Substance and Sexual Activity  . Alcohol use: Yes    Comment: occsionally wine  . Drug use: No  . Sexual activity: Not Currently    Birth control/protection: Surgical  Other Topics Concern  . Not on file  Social History Narrative   Lives with spouse who was recently diagnosed with esophageal ca and is receiving chemotherapy   Social Determinants of Health   Financial Resource Strain:   . Difficulty of Paying Living Expenses:   Food Insecurity:   . Worried About Charity fundraiser in the Last Year:   . Arboriculturist in the Last Year:   Transportation Needs:   . Film/video editor (Medical):   [name] Lack of Transportation (Non-Medical):   Physical Activity:   . Days of Exercise per Week:   . Minutes of Exercise per Session:   Stress:   . Feeling of Stress :   Social Connections:   . Frequency of Communication  with Friends and Family:   . Frequency of Social Gatherings with Friends and Family:   . Attends Religious Services:   . Active Member of Clubs or Organizations:   . Attends Archivist Meetings:   [name] Marital Status:      Review of Systems: A 12 point ROS discussed and pertinent positives are indicated in the HPI above.  All other systems are negative.  Review of Systems  Constitutional: Negative for fatigue and fever.  Respiratory: Negative for cough and shortness of breath.   Cardiovascular: Negative for chest pain.  Gastrointestinal: Positive for nausea and vomiting. Negative for abdominal pain.  Musculoskeletal: Positive for back pain.  Neurological: Negative for dizziness and headaches.  Psychiatric/Behavioral: Negative for behavioral problems and confusion.    Vital Signs: BP 112/76   Pulse 71   Temp 98.4 F (36.9 C) (Oral)   Resp (!) 26   Ht 5' (1.524 m)   Wt 162 lb 11.2 oz (73.8 kg)   SpO2 100%   BMI 31.78 kg/m   Physical Exam Vitals and nursing note reviewed.  Constitutional:      General: She is not in acute distress.    Appearance: She is normal weight. She is not ill-appearing.  HENT:     Mouth/Throat:     Mouth: Mucous membranes are moist.     Pharynx: Oropharynx is clear.  Cardiovascular:     Rate and Rhythm: Normal rate and regular rhythm.  Pulmonary:     Effort: Pulmonary effort is normal. No respiratory distress.     Breath sounds: Normal breath sounds.  Abdominal:     General: Abdomen is flat. There is no distension.     Palpations: Abdomen is soft.  Skin:    General: Skin is warm and dry.  Neurological:     General: No focal deficit present.     Mental Status: She is alert and oriented to person, place, and time. Mental status is at baseline.  Psychiatric:        Mood and Affect: Mood normal.        Behavior: Behavior normal.        Thought Content: Thought content normal.        Judgment: Judgment normal.      MD  Evaluation Airway: WNL Heart: WNL Abdomen: WNL Chest/ Lungs: WNL  ASA  Classification: 3 Mallampati/Airway Score: Two   Imaging: CT LUMBAR SPINE WO CONTRAST  Result Date: 12/23/2019 CLINICAL DATA:  Initial evaluation for compression fracture. EXAM: CT LUMBAR SPINE WITHOUT CONTRAST TECHNIQUE: Multidetector CT imaging of the lumbar spine was performed without intravenous contrast administration. Multiplanar CT image reconstructions were also generated. COMPARISON:  Prior CT from 08/13/2019 and radiograph from 11/24/2019 FINDINGS: Segmentation: Standard. Lowest well-formed disc space labeled the L5-S1 level. Alignment: Physiologic with preservation of the normal lumbar lordosis. No significant listhesis. Vertebrae: Mildly progressive central height loss seen at the superior and inferior endplates of 624THL as compared to previous CT, consistent with interval endplate compression fracture appearance is age indeterminate, but favored to be subacute in nature. Height loss measures no more than 20% without bony retropulsion. Severe compression deformity involving the L1 vertebral body has mildly worsened as compared to previous CT with a few scattered superimposed cortical lucencies, suggesting an acute or subacute component. Height loss now measures up to 75% with up to 5 mm bony retropulsion. Acute to subacute compression fracture involving the superior endplate of L3 with up to 40% height loss and 3 mm bony retropulsion. Compression deformity involving the superior endplate of L4 has progressed relative to previous radiograph and CT with a few subtle cortical lucencies, suggesting an acute to subacute component. Height loss measures up to 50% with 2 mm bony retropulsion. No extension into the posterior elements seen about any of these fractures. Vertebral body height otherwise maintained with no other acute or subacute fracture identified. Visualized sacrum and pelvis intact. SI joints approximated symmetric. No  discrete or worrisome osseous lesions. Underlying osteopenia noted. Paraspinal and other soft tissues: Mild scattered paraspinous edema seen adjacent to the above-mentioned compression fractures. Paraspinous soft tissues demonstrate no other acute finding. Chronic fatty atrophy noted within the posterior paraspinous musculature. Moderate aorto bi-iliac atherosclerotic disease. Few scattered renal cysts noted bilaterally. Visualized visceral structures otherwise unremarkable. Disc levels: T12-L1: 5 mm bony retropulsion related to the L1 compression fracture. Resultant mild canal with bilateral foraminal stenosis. L1-2: Minimal disc bulge. No significant canal or foraminal stenosis. L2-3: Mild diffuse disc bulge. Superimposed 3 mm bony retropulsion related to the L3 compression fracture. Superimposed bilateral facet hypertrophy. Resultant moderate spinal stenosis. Foramina remain patent. L3-4: Minimal disc bulge. Moderate bilateral facet hypertrophy. Trace 2 mm bony retropulsion related to the L4 compression deformity. Resultant moderate spinal stenosis. Foramina remain patent. L4-5: Diffuse disc bulge with associated annular calcification. Facet and ligament flavum hypertrophy. Resultant mild to moderate canal with bilateral subarticular stenosis. Mild bilateral L4 foraminal narrowing. L5-S1: Diffuse disc bulge. Moderate facet hypertrophy. Mild to moderate left greater than right lateral recess stenosis. Foramina remain patent. IMPRESSION: 1. Acute to subacute compression fractures involving the T12, L1, L3, and L4 vertebral bodies as above. Associated up to 5 mm bony retropulsion at the level of L1 with no more than mild spinal stenosis. These fractures are benign/mechanical in appearance. 2. Multilevel degenerative spondylosis with resultant mild to moderate spinal stenosis at L2-3, L3-4 and L4-5, with mild to moderate lateral recess narrowing at L5-S1. 3.  Aortic Atherosclerosis (ICD10-I70.0). Electronically  Signed   By: Jeannine Boga M.D.   On: 12/23/2019 01:47   DG Chest Port 1 View  Result Date: 12/22/2019 CLINICAL DATA:  Chest pain, left ventricular assist device EXAM: PORTABLE CHEST 1 VIEW COMPARISON:  12/25/2016 FINDINGS: Single frontal view of the chest demonstrates stable AICD, median sternotomy wires, and left ventricular assist device. Cardiac silhouette is stable. Chronic  vascular congestion without airspace disease, effusion, or pneumothorax. IMPRESSION: 1. Stable exam, no acute process. Electronically Signed   By: Randa Ngo M.D.   On: 12/22/2019 19:16   CUP PACEART INCLINIC DEVICE CHECK  Result Date: 11/30/2019 ICD check in clinic. No testing to pevent further battery depletion. Impedance trends stable over time. No mode switches. No ventricular arrhythmias. Histogram distribution appropriate for patient and level of activity.  Device programmed at appropriate safety margins. Device programmed to optimize intrinsic conduction. Estimated longevity at ERI. Patient has elected to not have generator change per Dr Rayann Heman patient alert alarms turned off. Pt will be unenrolled in remote follow-up. Patient education completed including shock plan.  XR Lumbar Spine 2-3 Views  Result Date: 11/24/2019 AP lateral lumbar spine x-rays obtained and reviewed this shows a left ventricular assist device.  Comparison to 10/27/2019 images.  Stable L1 compression fracture unchanged.  There is a new L3 compression fracture 40% since last month's images. Impression: Stable old L1 compression fracture.  New 40% L3 compression fracture.   Labs:  CBC: Recent Labs    10/25/19 1001 11/16/19 1001 12/22/19 1106 12/23/19 0314  WBC 6.0 7.2 7.2 10.0  HGB 11.7* 11.3* 11.4* 11.1*  HCT 37.7 37.0 37.4 35.5*  PLT 253 286 270 244    COAGS: Recent Labs    12/14/19 0000 12/21/19 0000 12/22/19 1106 12/23/19 0314  INR 4.3* 5.1* 3.6* 3.1*    BMP: Recent Labs    10/25/19 1001 11/16/19 1001  12/22/19 1106 12/23/19 0314  NA 138 138 139 141  K 4.4 4.3 4.1 3.9  CL 105 104 107 107  CO2 20* 23 20* 23  GLUCOSE 107* 96 118* 111*  BUN 10 11 5* 6*  CALCIUM 9.6 9.6 9.6 9.5  CREATININE 1.12* 1.11* 0.86 0.85  GFRNONAA 46* 46* >60 >60  GFRAA 53* 54* >60 >60    LIVER FUNCTION TESTS: Recent Labs    04/08/19 0852 11/16/19 1001 12/22/19 1106  BILITOT 0.5 0.5 0.3  AST 27 27 25   ALT 23 19 19   ALKPHOS 83 115 120  PROT 9.2* 9.7* 9.2*  ALBUMIN 3.3* 3.1* 3.0*    TUMOR MARKERS: No results for input(s): AFPTM, CEA, CA199, CHROMGRNA in the last 8760 hours.  Assessment and Plan: Back pain  T12, L1, L3, L4 compression fractures by CT Patient with history of NICM and HF s/p LVAD placement in 2014.  She is on chronic coumadin.  She presented for routine HF clinic follow-up yesterday complaining of back pain and nausea which worsened over the course of the day.  She is found to have several compression fracture of unknown age and acuity.  Case reviewed by Dr. Estanislado Pandy who recommends bone scan to better assess her fractures.  Patient is likely a candidate for kyphoplasty/vertebroplasty pending results.  Continue to hold coumadin.  INR 3.1 today.  IR following.   Thank you for this interesting consult.  I greatly enjoyed meeting Brianna Hanson and look forward to participating in their care.  A copy of this report was sent to the requesting provider on this date.  Electronically Signed: Docia Barrier, PA 12/23/2019, 4:53 PM   I spent a total of 40 Minutes    in face to face in clinical consultation, greater than 50% of which was counseling/coordinating care for compression fractures.

## 2019-12-23 NOTE — Progress Notes (Signed)
Initial Nutrition Assessment  DOCUMENTATION CODES:   Non-severe (moderate) malnutrition in context of chronic illness, Obesity unspecified  INTERVENTION:   - Ensure Enlive po BID, each supplement provides 350 kcal and 20 grams of protein  - Encourage PO intake  - Recommend liberalizing diet to Regular  NUTRITION DIAGNOSIS:   Moderate Malnutrition related to chronic illness (CHF) as evidenced by mild fat depletion, mild muscle depletion, moderate muscle depletion, percent weight loss (9.1% weight loss in 3 months).  GOAL:   Patient will meet greater than or equal to 90% of their needs  MONITOR:   PO intake, Supplement acceptance, Labs, Weight trends, I & O's  REASON FOR ASSESSMENT:   Malnutrition Screening Tool    ASSESSMENT:   85 year old female who was admitted with abdominal pain and refractory N/V due to lumbar compression fracture. PMH of CHF s/p LVAD in 2014, PAF, plasma cell disorder.   Pt still with severe pain and nausea. Poor PO intake. Zofran and Phenergan ordered PRN. IR consulted for possible vertebroplasty.  Spoke with pt at bedside. Pt experiencing N/V at time of RD visit. RN in room providing nursing care and reports pt vomiting bile.  Pt with a cup of ginger ale at bedside and states she is not really keeping it down. Pt reports medications have not yet helped.  Pt states that she has mostly been eating liquids since Monday 12/20/19 due to N/V. Pt reports that Monday was the last time she had a full meal.  Pt reports that when she is not experiencing N/V, she eats 2 meals (breakfast and dinner) and 1 snack (at lunchtime) daily. Pt declined to discuss PO intake further due to it causing her to feel more nauseated.  Pt endorses weight loss and reports her UBW as 175 lbs. Pt states that she now weighs in the 140's. However, current weight recorded as 162.7 lbs. Pt shares that she knows she has lost more than just fluid weight. Pt follows a low sodium diet at  home.  Reviewed weight history in chart. Pt with progressive weight loss since January 2021. Pt has experienced a 7.4 kg weight loss since 09/15/19. This is a 9.1% weight loss in 3 months which is significant for timeframe.  Pt reports that MD has encouraged her to drink Boost and that "they helped for a while." Pt willing to try strawberry Ensure during admission but is unsure how much she will be able to drink due to persistent N/V.  Meal Completion: 0% x 1 recorded meal  Medications reviewed and include: calcitonin, colace, protonix, PRN Zofran and Phenergan  Labs reviewed.  NUTRITION - FOCUSED PHYSICAL EXAM:    Most Recent Value  Orbital Region  Mild depletion  Upper Arm Region  Mild depletion  Thoracic and Lumbar Region  Mild depletion  Buccal Region  Mild depletion  Temple Region  Mild depletion  Clavicle Bone Region  Moderate depletion  Clavicle and Acromion Bone Region  Moderate depletion  Scapular Bone Region  Unable to assess  Dorsal Hand  Mild depletion  Patellar Region  Mild depletion  Anterior Thigh Region  Moderate depletion  Posterior Calf Region  Moderate depletion  Edema (RD Assessment)  Mild [BLE]  Hair  Reviewed  Eyes  Reviewed  Mouth  Reviewed  Skin  Reviewed  Nails  Reviewed       Diet Order:   Diet Order            Diet Heart Room service appropriate? Yes; Fluid consistency:  Thin  Diet effective now              EDUCATION NEEDS:   Education needs have been addressed  Skin:  Skin Assessment: Reviewed RN Assessment  Last BM:  12/21/19  Height:   Ht Readings from Last 1 Encounters:  12/22/19 5' (1.524 m)    Weight:   Wt Readings from Last 1 Encounters:  12/23/19 73.8 kg    Ideal Body Weight:  45.5 kg  BMI:  Body mass index is 31.78 kg/m.  Estimated Nutritional Needs:   Kcal:  1800-2000  Protein:  90-105 grams  Fluid:  >/= 1.5 L    Gaynell Face, MS, RD, LDN Inpatient Clinical Dietitian Pager:  [phone] Weekend/After Hours: [phone]

## 2019-12-23 NOTE — Progress Notes (Signed)
Patients spouse changed LVAD dressing, weekly

## 2019-12-23 NOTE — Evaluation (Signed)
Occupational Therapy Evaluation Patient Details Name: Brianna Hanson MRN: [id number] DOB: [birth year] Today's Date: 12/23/2019    History of Present Illness 85 yo admitted with N/V due to pain from back with T12, L1, L3, L4 compression fx. PMHx: HF, NICM, PAF, plasma cell disorder, LVAD heartmate II 04/06/13, lap chole   Clinical Impression   PTA pt living with long term boyfriend, not using DME, but receiving assist as needed for BADL. Pt is old LVAD. At time of eval, pt experiencing n/v, RN aware. She was able to complete bed mobility at min guard- min A. Pt then completed stand pivot transfer with min A +2 for safety and steadying. Educated pt on spinal precautions for comfort with mobility and ADL. At this time recommend HHOT at d/c, will continue to follow per POC listed below.    Follow Up Recommendations  Home health OT;Supervision/Assistance - 24 hour    Equipment Recommendations  3 in 1 bedside commode    Recommendations for Other Services       Precautions / Restrictions Precautions Precautions: Fall;Back;Other (comment) Precaution Comments: LVAD, back precautions for comfort with all discussed Restrictions Weight Bearing Restrictions: No      Mobility Bed Mobility Overal bed mobility: Needs Assistance Bed Mobility: Rolling;Sidelying to Sit;Sit to Sidelying Rolling: Min guard Sidelying to sit: Min guard     Sit to sidelying: Min assist General bed mobility comments: cues for sequence with increased time and tactile cues to maintain precautions for exiting bed. Return to bed assist to bring legs to surface with mod cues  Transfers Overall transfer level: Needs assistance Equipment used: 2 person hand held assist Transfers: Sit to/from Stand;Stand Pivot Transfers Sit to Stand: Min assist;+2 safety/equipment Stand pivot transfers: Min assist;+2 safety/equipment       General transfer comment: min assist to rise from bed and chair, bil HHA to pivot bed<>chair. Pt in  chair then declined remaining there due to pain and assisted back to bed    Balance Overall balance assessment: Needs assistance   Sitting balance-Leahy Scale: Fair     Standing balance support: Bilateral upper extremity supported Standing balance-Leahy Scale: Poor Standing balance comment: bil UE support in standing                           ADL either performed or assessed with clinical judgement   ADL Overall ADL's : Needs assistance/impaired Eating/Feeding: Set up;Sitting   Grooming: Set up;Sitting   Upper Body Bathing: Minimal assistance;Sitting   Lower Body Bathing: Moderate assistance;Sit to/from stand;Sitting/lateral leans   Upper Body Dressing : Set up;Sitting   Lower Body Dressing: Moderate assistance;Sit to/from stand;Sitting/lateral leans   Toilet Transfer: Moderate assistance;Stand-pivot;BSC   Toileting- Clothing Manipulation and Hygiene: Minimal assistance;Sit to/from stand         General ADL Comments: limited by n/v and back pain     Vision Patient Visual Report: No change from baseline       Perception     Praxis      Pertinent Vitals/Pain Pain Assessment: 0-10 Pain Score: 5  Pain Location: shoulder blades Pain Descriptors / Indicators: Aching Pain Intervention(s): Limited activity within patient's tolerance;Monitored during session;Repositioned     Hand Dominance Right   Extremity/Trunk Assessment Upper Extremity Assessment Upper Extremity Assessment: Generalized weakness   Lower Extremity Assessment Lower Extremity Assessment: Generalized weakness   Cervical / Trunk Assessment Cervical / Trunk Assessment: Kyphotic   Communication Communication Communication: No difficulties  Cognition Arousal/Alertness: Awake/alert Behavior During Therapy: Flat affect Overall Cognitive Status: Impaired/Different from baseline Area of Impairment: Problem solving                             Problem Solving: Slow  processing General Comments: overall slow to process with basic commands and questions   General Comments       Exercises     Shoulder Instructions      Home Living Family/patient expects to be discharged to:: Private residence Living Arrangements: Spouse/significant other Available Help at Discharge: Family;Available 24 hours/day Type of Home: House Home Access: Stairs to enter [employer] of Steps: 1 up to porch, 1 up to kitchen   Home Layout: One level     Bathroom Shower/Tub: Occupational psychologist: Handicapped height     Home Equipment: Shower seat;Grab bars - tub/shower;Grab bars - toilet;Hand held Tourist information centre manager - 2 wheels   Additional Comments: old LVAD      Prior Functioning/Environment Level of Independence: Needs assistance  Gait / Transfers Assistance Needed: not using DME ADL's / Homemaking Assistance Needed: boyfriend assists with ADLs; boyfriend does  IADLs            OT Problem List: Decreased strength;Decreased knowledge of use of DME or AE;Decreased knowledge of precautions;Decreased activity tolerance;Cardiopulmonary status limiting activity;Impaired balance (sitting and/or standing);Pain      OT Treatment/Interventions: Self-care/ADL training;Therapeutic exercise;Patient/family education;Balance training;Energy conservation;Therapeutic activities;DME and/or AE instruction    OT Goals(Current goals can be found in the care plan section) Acute Rehab OT Goals Patient Stated Goal: be able to walk and get rid of back pain OT Goal Formulation: With patient Time For Goal Achievement: 01/06/20 Potential to Achieve Goals: Good  OT Frequency: Min 2X/week   Barriers to D/C:            Co-evaluation   Reason for Co-Treatment: For patient/therapist safety PT goals addressed during session: Mobility/safety with mobility        AM-PAC OT "6 Clicks" Daily Activity     Outcome Measure Help from another person eating  meals?: None Help from another person taking care of personal grooming?: A Little Help from another person toileting, which includes using toliet, bedpan, or urinal?: A Lot Help from another person bathing (including washing, rinsing, drying)?: A Lot Help from another person to put on and taking off regular upper body clothing?: A Little Help from another person to put on and taking off regular lower body clothing?: A Lot 6 Click Score: 16   End of Session Nurse Communication: Mobility status  Activity Tolerance: Treatment limited secondary to medical complications (Comment);Other (comment);Patient limited by pain(n/v) Patient left: in bed;with call bell/phone within reach;with bed alarm set  OT Visit Diagnosis: Unsteadiness on feet (R26.81);Other abnormalities of gait and mobility (R26.89);Muscle weakness (generalized) (M62.81);Pain Pain - part of body: (back)                Time: [id number] OT Time Calculation (min): 26 min Charges:  OT General Charges $OT Visit: 1 Visit OT Evaluation $OT Eval Moderate Complexity: [place], MSOT, OTR/L [facility] Office Number: [phone] Pager: [phone]  Zenovia Jarred 12/23/2019, 12:58 PM

## 2019-12-23 NOTE — Progress Notes (Signed)
Patient still having some nausea/vomitting, zofran given she states she "feels a little better". Patient tried to drink strawberry Ensure but she vomited after drinking 25%. Patient given ginger ale.

## 2019-12-23 NOTE — Progress Notes (Signed)
LVAD Coordinator Rounding Note:  Admitted to Dr. Clayborne Dana service on 12/22/19 due to abdominal pain with refractory nausea and vomiting due to lumbar compression fracture. [name]   HM II LVAD implanted on 04/06/13 by Dr. Cyndia Bent under Destination Therapy criteria due to advanced age.   PT/OT working with patient this am. Attempted to get patient OOB to chair, pt vomited; getting her back to bed now. Pt experiencing pain with any movement. Pt reports nurse "just gave me something for pain".  Vital signs: Temp:  99.1 HR: 80 Doppler Pressure: 118 Automatic BP: 120/72 (88) O2 Sat: 97% RA Wt: 164.9>162.7   LVAD interrogation reveals:  Speed:  8600 Flow:  3.8 Power:  4.3 PI: 6.9 Alarms: none Events:  none  Fixed speed: 8600 Low speed limit: 8000  Drive Line:  C/D/I with anchor intact and accurately applied. Weekly dressing changes per bedside nurse.   Labs:  LDH trend: 203>219  INR trend: 3.6>3.1  Anticoagulation Plan: -INR Goal: 2.0 - 2.5 - holding warfarin for possible procedure -ASA Dose: none (allergy)  Device:  St Jude dual ICD - Pacing: DDD 60 -Therapies: on 200   Plan/Recommendations:  1. Call VAD Coordinator for any VAD equipment or drive line issues. 2. Weekly LVAD drive line dressing changes per bedside nurse.  Zada Girt RN, VAD Coordinator 24/7 VAD Pager: [phone]

## 2019-12-24 ENCOUNTER — Inpatient Hospital Stay (HOSPITAL_COMMUNITY): Payer: Medicare Other

## 2019-12-24 DIAGNOSIS — S32000A Wedge compression fracture of unspecified lumbar vertebra, initial encounter for closed fracture: Secondary | ICD-10-CM | POA: Diagnosis not present

## 2019-12-24 DIAGNOSIS — Z95811 Presence of heart assist device: Secondary | ICD-10-CM

## 2019-12-24 DIAGNOSIS — R112 Nausea with vomiting, unspecified: Secondary | ICD-10-CM | POA: Diagnosis not present

## 2019-12-24 DIAGNOSIS — I5022 Chronic systolic (congestive) heart failure: Secondary | ICD-10-CM | POA: Diagnosis not present

## 2019-12-24 LAB — CBC
HCT: 36.2 % (ref 36.0–46.0)
Hemoglobin: 11.2 g/dL — ABNORMAL LOW (ref 12.0–15.0)
MCH: 30.6 pg (ref 26.0–34.0)
MCHC: 30.9 g/dL (ref 30.0–36.0)
MCV: 98.9 fL (ref 80.0–100.0)
Platelets: 228 10*3/uL (ref 150–400)
RBC: 3.66 MIL/uL — ABNORMAL LOW (ref 3.87–5.11)
RDW: 14.4 % (ref 11.5–15.5)
WBC: 8.6 10*3/uL (ref 4.0–10.5)
nRBC: 0 % (ref 0.0–0.2)

## 2019-12-24 LAB — LACTATE DEHYDROGENASE: LDH: 202 U/L — ABNORMAL HIGH (ref 98–192)

## 2019-12-24 LAB — BASIC METABOLIC PANEL
Anion gap: 10 (ref 5–15)
BUN: 5 mg/dL — ABNORMAL LOW (ref 8–23)
CO2: 25 mmol/L (ref 22–32)
Calcium: 9.5 mg/dL (ref 8.9–10.3)
Chloride: 103 mmol/L (ref 98–111)
Creatinine, Ser: 0.92 mg/dL (ref 0.44–1.00)
GFR calc Af Amer: >60 mL/min (ref >60)
GFR calc non Af Amer: 58 mL/min — ABNORMAL LOW (ref >60)
Glucose, Bld: 101 mg/dL — ABNORMAL HIGH (ref 70–99)
Potassium: 3.6 mmol/L (ref 3.5–5.1)
Sodium: 138 mmol/L (ref 135–145)

## 2019-12-24 LAB — MAGNESIUM: Magnesium: 1.9 mg/dL (ref 1.7–2.4)

## 2019-12-24 LAB — PROTIME-INR
INR: 2.8 — ABNORMAL HIGH (ref 0.8–1.2)
Prothrombin Time: 29.1 seconds — ABNORMAL HIGH (ref 11.4–15.2)

## 2019-12-24 LAB — URINALYSIS, COMPLETE (UACMP) WITH MICROSCOPIC
Bilirubin Urine: NEGATIVE
Glucose, UA: NEGATIVE mg/dL
Hgb urine dipstick: NEGATIVE
Ketones, ur: NEGATIVE mg/dL
Leukocytes,Ua: NEGATIVE
Nitrite: NEGATIVE
Protein, ur: 30 mg/dL — AB
Specific Gravity, Urine: 1.015 (ref 1.005–1.030)
pH: 5 (ref 5.0–8.0)

## 2019-12-24 MED ORDER — POTASSIUM CHLORIDE CRYS ER 20 MEQ PO TBCR
40.0000 meq | EXTENDED_RELEASE_TABLET | Freq: Once | ORAL | Status: AC
  Administered 2019-12-24: 40 meq via ORAL
  Filled 2019-12-24: qty 2

## 2019-12-24 MED ORDER — TECHNETIUM TC 99M MEDRONATE IV KIT
20.0000 | PACK | Freq: Once | INTRAVENOUS | Status: AC | PRN
  Administered 2019-12-24: 20 via INTRAVENOUS

## 2019-12-24 MED ORDER — MAGNESIUM SULFATE 2 GM/50ML IV SOLN
2.0000 g | Freq: Once | INTRAVENOUS | Status: AC
  Administered 2019-12-24: 11:00:00 2 g via INTRAVENOUS
  Filled 2019-12-24: qty 50

## 2019-12-24 NOTE — Plan of Care (Signed)
  Problem: Education: Goal: Knowledge of General Education information will improve Description: Including pain rating scale, medication(s)/side effects and non-pharmacologic comfort measures Outcome: Progressing   Problem: Health Behavior/Discharge Planning: Goal: Ability to manage health-related needs will improve Outcome: Progressing   Problem: Clinical Measurements: Goal: Ability to maintain clinical measurements within normal limits will improve Outcome: Progressing Goal: Will remain free from infection Outcome: Progressing Goal: Diagnostic test results will improve Outcome: Progressing Goal: Respiratory complications will improve Outcome: Progressing   Problem: Activity: Goal: Risk for activity intolerance will decrease Outcome: Progressing   Problem: Elimination: Goal: Will not experience complications related to bowel motility Outcome: Progressing Goal: Will not experience complications related to urinary retention Outcome: Progressing   Problem: Pain Managment: Goal: General experience of comfort will improve Outcome: Progressing

## 2019-12-24 NOTE — Progress Notes (Signed)
Back from nuclear med by bed awake and alert. 

## 2019-12-24 NOTE — Progress Notes (Addendum)
Patient ID: Brianna Hanson, female   DOB: Mar 25, 1935, 85 y.o.   MRN: [id number] Saw patient last night . Discussed with [name] her problems with persistent pain after 2nd compression fracture now one month out. CT shows compression fractures at T12, L1, L3 and L4.  L1 has some retropulsion but only 5 mm.    Vertebraplasty  Is an option but difficult to determine with acute and subacute components at multiple levels which may be her pain generator. Not MRI candidate which normally would help to determine more acute levels. Bone scan may help as well. Comparison to xrays in my office she has gotten more compression at L4 since xrays on 11/24/19.  I  My cell [phone]

## 2019-12-24 NOTE — Progress Notes (Addendum)
Advanced Heart Failure VAD Team Note  PCP-Cardiologist: No primary care provider on file.   Subjective:   Admitted with N/V, abd pain due to lumbar compression fracture.   CT Lumbar 1. Acute to subacute compression fractures involving the T12, L1, L3, and L4 vertebral bodies as above. Associated up to 5 mm bony retropulsion at the level of L1 with no more than mild spinal stenosis. These fractures are benign/mechanical in appearance. 2. Multilevel degenerative spondylosis with resultant mild to moderate spinal stenosis at L2-3, L3-4 and L4-5, with mild to moderate lateral recess narrowing at L5-S1.  Having ongoing nausea after eating. Still with pain.   LVAD INTERROGATION:  HeartMate II LVAD:   Flow 3.5  liters/min, speed 8600, power 4  PI 6.8   Objective:    Vital Signs:   Temp:  [98.4 F (36.9 C)-99.1 F (37.3 C)] 98.7 F (37.1 C) (04/23 0800) Pulse Rate:  [62-79] 75 (04/23 0420) Resp:  [15-26] 16 (04/23 0420) BP: (103-131)/(70-85) 131/85 (04/23 0420) SpO2:  [94 %-100 %] 99 % (04/23 0420) Weight:  [69.6 kg] 69.6 kg (04/23 0646) Last BM Date: 12/21/19 Mean arterial Pressure 90s    Intake/Output:   Intake/Output Summary (Last 24 hours) at 12/24/2019 1006 Last data filed at 12/24/2019 0800 Gross per 24 hour  Intake 824.3 ml  Output 260 ml  Net 564.3 ml     Physical Exam   Physical Exam: GENERAL: In bed. NAD  HEENT: normal  NECK: Supple, JVP flat .  2+ bilaterally, no bruits.  No lymphadenopathy or thyromegaly appreciated.   CARDIAC:  Mechanical heart sounds with LVAD hum present.  LUNGS:  Clear to auscultation bilaterally.  ABDOMEN:  Soft, round, nontender, positive bowel sounds x4.     LVAD exit site: well-healed and incorporated.  Dressing dry and intact.  No erythema or drainage.  Stabilization device present and accurately applied.  Driveline dressing is being changed daily per sterile technique. EXTREMITIES:  Warm and dry, no cyanosis, clubbing, rash or  edema  NEUROLOGIC:  Alert and oriented x 3.  No aphasia.  No dysarthria.  Affect flat     Telemetry   NSR 60-70s personally reviewed   EKG    N/A  Labs   Basic Metabolic Panel: Recent Labs  Lab 12/22/19 1106 12/23/19 0314 12/23/19 2317 12/24/19 0259  NA 139 141  --  138  K 4.1 3.9  --  3.6  CL 107 107  --  103  CO2 20* 23  --  25  GLUCOSE 118* 111*  --  101*  BUN 5* 6*  --  5*  CREATININE 0.86 0.85  --  0.92  CALCIUM 9.6 9.5  --  9.5  MG  --   --  1.9  --     Liver Function Tests: Recent Labs  Lab 12/22/19 1106  AST 25  ALT 19  ALKPHOS 120  BILITOT 0.3  PROT 9.2*  ALBUMIN 3.0*   Recent Labs  Lab 12/23/19 0314  LIPASE 19  AMYLASE 76   No results for input(s): AMMONIA in the last 168 hours.  CBC: Recent Labs  Lab 12/22/19 1106 12/23/19 0314 12/24/19 0259  WBC 7.2 10.0 8.6  HGB 11.4* 11.1* 11.2*  HCT 37.4 35.5* 36.2  MCV 101.4* 98.9 98.9  PLT 270 244 228    INR: Recent Labs  Lab 12/21/19 0000 12/22/19 1106 12/23/19 0314 12/24/19 0259  INR 5.1* 3.6* 3.1* 2.8*    Other results:  EKG:  Imaging   CT LUMBAR SPINE WO CONTRAST  Result Date: 12/23/2019 CLINICAL DATA:  Initial evaluation for compression fracture. EXAM: CT LUMBAR SPINE WITHOUT CONTRAST TECHNIQUE: Multidetector CT imaging of the lumbar spine was performed without intravenous contrast administration. Multiplanar CT image reconstructions were also generated. COMPARISON:  Prior CT from 08/13/2019 and radiograph from 11/24/2019 FINDINGS: Segmentation: Standard. Lowest well-formed disc space labeled the L5-S1 level. Alignment: Physiologic with preservation of the normal lumbar lordosis. No significant listhesis. Vertebrae: Mildly progressive central height loss seen at the superior and inferior endplates of 624THL as compared to previous CT, consistent with interval endplate compression fracture appearance is age indeterminate, but favored to be subacute in nature. Height loss measures  no more than 20% without bony retropulsion. Severe compression deformity involving the L1 vertebral body has mildly worsened as compared to previous CT with a few scattered superimposed cortical lucencies, suggesting an acute or subacute component. Height loss now measures up to 75% with up to 5 mm bony retropulsion. Acute to subacute compression fracture involving the superior endplate of L3 with up to 40% height loss and 3 mm bony retropulsion. Compression deformity involving the superior endplate of L4 has progressed relative to previous radiograph and CT with a few subtle cortical lucencies, suggesting an acute to subacute component. Height loss measures up to 50% with 2 mm bony retropulsion. No extension into the posterior elements seen about any of these fractures. Vertebral body height otherwise maintained with no other acute or subacute fracture identified. Visualized sacrum and pelvis intact. SI joints approximated symmetric. No discrete or worrisome osseous lesions. Underlying osteopenia noted. Paraspinal and other soft tissues: Mild scattered paraspinous edema seen adjacent to the above-mentioned compression fractures. Paraspinous soft tissues demonstrate no other acute finding. Chronic fatty atrophy noted within the posterior paraspinous musculature. Moderate aorto bi-iliac atherosclerotic disease. Few scattered renal cysts noted bilaterally. Visualized visceral structures otherwise unremarkable. Disc levels: T12-L1: 5 mm bony retropulsion related to the L1 compression fracture. Resultant mild canal with bilateral foraminal stenosis. L1-2: Minimal disc bulge. No significant canal or foraminal stenosis. L2-3: Mild diffuse disc bulge. Superimposed 3 mm bony retropulsion related to the L3 compression fracture. Superimposed bilateral facet hypertrophy. Resultant moderate spinal stenosis. Foramina remain patent. L3-4: Minimal disc bulge. Moderate bilateral facet hypertrophy. Trace 2 mm bony retropulsion  related to the L4 compression deformity. Resultant moderate spinal stenosis. Foramina remain patent. L4-5: Diffuse disc bulge with associated annular calcification. Facet and ligament flavum hypertrophy. Resultant mild to moderate canal with bilateral subarticular stenosis. Mild bilateral L4 foraminal narrowing. L5-S1: Diffuse disc bulge. Moderate facet hypertrophy. Mild to moderate left greater than right lateral recess stenosis. Foramina remain patent. IMPRESSION: 1. Acute to subacute compression fractures involving the T12, L1, L3, and L4 vertebral bodies as above. Associated up to 5 mm bony retropulsion at the level of L1 with no more than mild spinal stenosis. These fractures are benign/mechanical in appearance. 2. Multilevel degenerative spondylosis with resultant mild to moderate spinal stenosis at L2-3, L3-4 and L4-5, with mild to moderate lateral recess narrowing at L5-S1. 3.  Aortic Atherosclerosis (ICD10-I70.0). Electronically Signed   By: Jeannine Boga M.D.   On: 12/23/2019 01:47   DG Chest Port 1 View  Result Date: 12/22/2019 CLINICAL DATA:  Chest pain, left ventricular assist device EXAM: PORTABLE CHEST 1 VIEW COMPARISON:  12/25/2016 FINDINGS: Single frontal view of the chest demonstrates stable AICD, median sternotomy wires, and left ventricular assist device. Cardiac silhouette is stable. Chronic vascular congestion without airspace disease, effusion, or  pneumothorax. IMPRESSION: 1. Stable exam, no acute process. Electronically Signed   By: Randa Ngo M.D.   On: 12/22/2019 19:16     Medications:     Scheduled Medications: . amiodarone  200 mg Oral Daily  . calcitonin (salmon)  1 spray Alternating Nares Daily  . carvedilol  6.25 mg Oral BID WC  . docusate sodium  100 mg Oral Daily  . feeding supplement (ENSURE ENLIVE)  237 mL Oral BID BM  . gabapentin  300 mg Oral BID  . hydrALAZINE  100 mg Oral TID  . losartan  100 mg Oral Daily  . pantoprazole sodium  20 mg Oral Daily      Infusions:   PRN Medications: acetaminophen, fentaNYL (SUBLIMAZE) injection, ondansetron (ZOFRAN) IV, promethazine, traMADol    Assessment/Plan:    1) Ab pain in setting of intractable n/v due to her back pain and pain meds - abdomen is benign on exam - - Continue prn phenergan .  - try low-dose fentanyl to see if she can tolerate  2) Low back and left hip pain in setting of L1 compression fracture and new L3 fracture - Following with Dr. Lorin Mercy. Options limited - Dr Haroldine Laws spoke with Dr. Lorin Mercy and given pain persisting > 1 month would consider L3 vertebroplasty with IR.  -IR consulted.  For bone scan today.  - Try intranasal calcitonin and low-dose fentanyl  - Can use brace if will fit around VAD driveline PT/OT consulted. Will need HHPT/OT. Ordered today.   3) Anorexia - Received IV fluids.  - continue Boost   4) Chronic systolic HF: NICM, s/p ICD and LVAD for DT(04/2013).  - now 6+ years out on VAD support -  Typically NYHA II but now IIIb in setting of ortho issues - Volume status stable. Does not need diuretics.  -Maps stable. Continue current dose of losartan, carvedilol, and hydralazine.   5) LVAD placed for DT 04/2013:  - Failed Entresto due to diuresis and incessant low flow alarms.  - Continue losartan 100 mg daily.  - VAD interrogated personally. Parameters stable. - No ASA with GI bleed and intolerance.  - LDH stable 202  - INR 2.8 .  Goal 1.8-2.3. She has not received coumadin since admit.   6) PAF:  - Did not tolerate AF well at all.  - S/p DC-CV 06/18/19 - Maintaining NSR. Continue amio 200 daily.  - Management of warfarin as above - no change  7) HTN:  Stable. Continue current regimen.   8) Chronic Anticoagulation: .   - Goal INR 1.8-2.3.  -  INR 2.8 she has been off coumadin.   IR consulted 12/23/19   I reviewed the LVAD parameters from today, and compared the results to the patient's prior recorded data.  No programming changes  were made.  The LVAD is functioning within specified parameters.  The patient performs LVAD self-test daily.  LVAD interrogation was negative for any significant power changes, alarms or PI events/speed drops.  LVAD equipment check completed and is in good working order.  Back-up equipment present.   LVAD education done on emergency procedures and precautions and reviewed exit site care.  Length of Stay: 2  Darrick Grinder, NP 12/24/2019, 10:06 AM  VAD Team --- VAD ISSUES ONLY--- Pager [phone] (7am - 7am)  Advanced Heart Failure Team  Pager [phone] (M-F; 7a - 4p)  Please contact [facility] for night-coverage after hours (4p -7a ) and weekends on [email or web address]  Patient seen and examined with the  above-signed Engineer, building services. I personally reviewed laboratory data, imaging studies and relevant notes. I independently examined the patient and formulated the important aspects of the plan. I have edited the note to reflect any of my changes or salient points. I have personally discussed the plan with the patient and/or family.  Still with back pain, ab pain and nausea. Amylase/lipase ok.   Bone scan today with multi-level compression fractures. IR on board. No HF. Volumes status ok. INR 2.8. VAD interrogated personally. Parameters stable.  General:  Weak appearing. Uncomfortable.  HEENT: normal  Neck: supple. JVP not elevated.  Carotids 2+ bilat; no bruits. No lymphadenopathy or thryomegaly appreciated. Cor: LVAD hum.  Lungs: Clear. Abdomen: obese soft, nontender, non-distended. No hepatosplenomegaly. No bruits or masses. Good bowel sounds. Driveline site clean. Anchor in place.  Extremities: no cyanosis, clubbing, rash. Warm no edema  Neuro: alert & oriented x 3. No focal deficits. Moves all 4 without problem   She has multilevel compression fractures with pain and nausea. IR consulted for possible vertebroplasty. D/w Dr. Lorin Mercy in Ortho. INR 2.8 Continue to hold  coumadin. Can give more IVF as needed. Continue pain control. VAD interrogated personally. Parameters stable.  Glori Bickers, MD  9:08 PM

## 2019-12-24 NOTE — Progress Notes (Signed)
PT Cancellation Note  Patient Details Name: Brianna Hanson MRN: [id number] DOB: [birth year]   Cancelled Treatment:    Reason Eval/Treat Not Completed: Pain limiting ability to participate(pt reports pain 2/10 at rest and that she got up to chair this morning but had to return to bed due to pain. Pt deferring further therapy until after bone scan and decision on vertebroplasty)   Eldred Sooy B Jasir Rother 12/24/2019, 11:05 AM [place] Pager: [phone] Office: [phone]

## 2019-12-24 NOTE — Progress Notes (Signed)
Reviewed bone scan. L4 Fx appears newest and changed from xrays one month ago, so that would be level I would recommend vertebroplasty to start with.    Dr. Estanislado Pandy can review studies and decide.

## 2019-12-24 NOTE — Progress Notes (Signed)
NP made aware about the intermittent wide complexes from telemetry.no complaints presented . Continue to monitor.

## 2019-12-24 NOTE — Plan of Care (Signed)
?  Problem: Education: ?Goal: Knowledge of General Education information will improve ?Description: Including pain rating scale, medication(s)/side effects and non-pharmacologic comfort measures ?Outcome: Progressing ?  ?Problem: Health Behavior/Discharge Planning: ?Goal: Ability to manage health-related needs will improve ?Outcome: Progressing ?  ?Problem: Clinical Measurements: ?Goal: Ability to maintain clinical measurements within normal limits will improve ?Outcome: Progressing ?Goal: Will remain free from infection ?Outcome: Progressing ?Goal: Diagnostic test results will improve ?Outcome: Progressing ?Goal: Respiratory complications will improve ?Outcome: Progressing ?Goal: Cardiovascular complication will be avoided ?Outcome: Progressing ?  ?Problem: Activity: ?Goal: Risk for activity intolerance will decrease ?Outcome: Progressing ?  ?Problem: Nutrition: ?Goal: Adequate nutrition will be maintained ?Outcome: Progressing ?  ?Problem: Coping: ?Goal: Level of anxiety will decrease ?Outcome: Progressing ?  ?Problem: Elimination: ?Goal: Will not experience complications related to bowel motility ?Outcome: Progressing ?Goal: Will not experience complications related to urinary retention ?Outcome: Progressing ?  ?Problem: Pain Managment: ?Goal: General experience of comfort will improve ?Outcome: Progressing ?  ?Problem: Safety: ?Goal: Ability to remain free from injury will improve ?Outcome: Progressing ?  ?Problem: Skin Integrity: ?Goal: Risk for impaired skin integrity will decrease ?Outcome: Progressing ?  ?Problem: Education: ?Goal: Patient will understand all VAD equipment and how it functions ?Outcome: Progressing ?Goal: Patient will be able to verbalize current INR target range and antiplatelet therapy for discharge home ?Outcome: Progressing ?  ?Problem: Cardiac: ?Goal: LVAD will function as expected and patient will experience no clinical alarms ?Outcome: Progressing ?  ?

## 2019-12-24 NOTE — Progress Notes (Signed)
Bone scan reviewed by Dr. Estanislado Pandy.  Patient has fractures at T10, T12, L1, L3, and L4.  Consideration made for vertebral augementation at the 4 levels amenable- T10, T12, L3, and L4 due to acuity as well as patient symptoms.   Continue to hold coumadin.  INR improved to 2.8 today.  Continue to follow INR through the weekend.  Brynda Greathouse, MS RD PA-C

## 2019-12-24 NOTE — Progress Notes (Signed)
Transported to [facility] by bed accompanied by [employer].

## 2019-12-24 NOTE — TOC Initial Note (Signed)
Transition of Care [facility]) - Initial/Assessment Note    Patient Details  Name: Brianna Hanson MRN: [id number] Date of Birth: 06-Sep-1934  Transition of [facility]) CM/SW Contact:    Zenon Mayo, RN Phone Number: 12/24/2019, 11:01 AM  Clinical Narrative:                 NCM spoke with patient, offered choice, she states she does not have a preference for HHPT, HHOT, NCM made referral to [facility] with [facility], she is able to take referral . Soc will begin 24 to 48 hrs post dc. Patient states she has transportation home and she has no issues getting meds.   Expected Discharge Plan: [place] Barriers to Discharge: Continued Medical Work up   Patient Goals and CMS Choice Patient states their goals for this hospitalization and ongoing recovery are:: get better CMS Medicare.gov Compare Post Acute Care list provided to:: Patient Choice offered to / list presented to : Patient  Expected Discharge Plan and Services Expected Discharge Plan: [place] In-house Referral: NA Discharge Planning Services: CM Consult Post Acute Care Choice: [place] arrangements for the past 2 months: Single Family Home                   DME Agency: NA       HH Arranged: PT, OT [place] Agency: [place] Date HH Agency Contacted: 12/24/19 Time HH Agency Contacted: 1100 Representative spoke with at [place]: Butch Penny  Prior Living Arrangements/Services Living arrangements for the past 2 months: [place] with:: Significant Other Patient language and need for interpreter reviewed:: Yes Do you feel safe going back to the place where you live?: Yes      Need for Family Participation in Patient Care: Yes (Comment) Care giver support system in place?: Yes (comment)   Criminal Activity/Legal Involvement Pertinent to Current Situation/Hospitalization: No - Comment as needed  Activities of Daily Living Home Assistive  Devices/Equipment: Eyeglasses, Scales, Other (Comment)(LVAD) ADL Screening (condition at time of admission) Patient's cognitive ability adequate to safely complete daily activities?: Yes Is the patient deaf or have difficulty hearing?: No Does the patient have difficulty seeing, even when wearing glasses/contacts?: No Does the patient have difficulty concentrating, remembering, or making decisions?: No Patient able to express need for assistance with ADLs?: Yes Does the patient have difficulty dressing or bathing?: Yes Independently performs ADLs?: No Communication: Independent Dressing (OT): Needs assistance Is this a change from baseline?: Pre-admission baseline Grooming: Needs assistance Is this a change from baseline?: Pre-admission baseline Feeding: Independent Bathing: Needs assistance Is this a change from baseline?: Pre-admission baseline Toileting: Needs assistance Is this a change from baseline?: Pre-admission baseline In/Out Bed: Independent Walks in Home: Independent Does the patient have difficulty walking or climbing stairs?: No Weakness of Legs: None Weakness of Arms/Hands: None  Permission Sought/Granted                  Emotional Assessment Appearance:: Appears stated age Attitude/Demeanor/Rapport: Engaged Affect (typically observed): Appropriate Orientation: : Oriented to Place, Oriented to Self, Oriented to  Time, Oriented to Situation Alcohol / Substance Use: Not Applicable Psych Involvement: No (comment)  Admission diagnosis:  Dehydration [E86.0] Intractable nausea and vomiting [R11.2] Non-intractable vomiting with nausea, unspecified vomiting type [R11.2] Compression fracture of L3 vertebra with delayed healing, subsequent encounter [S32.030G] Patient Active Problem List   Diagnosis Date Noted  . Nausea and vomiting 12/22/2019  . Intractable  nausea and vomiting 12/22/2019  . Closed compression fracture of L3 lumbar vertebra, initial encounter  ([place]) 11/24/2019  . Primary osteoarthritis of both hips 09/07/2019  . Acute hip pain, bilateral 09/06/2019  . Osteoporosis with pathological fracture 09/06/2019  . Lumbar compression fracture ([place]) 08/13/2019  . Acute left-sided low back pain with left-sided sciatica 08/04/2019  . Closed wedge compression fracture of first lumbar vertebra ([place]) 08/04/2019  . Hyperlipidemia LDL goal <130 03/04/2018  . Eczema of lower extremity 03/04/2018  . Diverticulosis of colon with hemorrhage   . Primary osteoarthritis of left knee 09/05/2016  . Iron deficiency anemia 07/11/2015  . Chronic anticoagulation 11/24/2013  . Chronic systolic congestive heart failure ([place]) 05/01/2013  . Abdominal pain 04/18/2013  . LVAD (left ventricular assist device) present ([place]) 04/07/2013  . Right heart failure ([place]) 03/29/2013  . PAH (pulmonary artery hypertension) ([place]) 03/29/2013  . Plasma cell disorder 03/20/2012  . Long term current use of anticoagulant therapy 02/14/2012  . Other abnormal glucose 02/03/2012  . Chronic renal insufficiency, stage II (mild) 02/03/2012  . Atrial fibrillation ([place]) 11/16/2010  . Essential hypertension, benign 05/13/2007  . CARDIOMYOPATHY, PRIMARY NEC 05/13/2007  . OSTEOPENIA 05/13/2007   PCP:  Janith Lima, MD Pharmacy:   [id number] - [place], [address] [place] [address] Phone: [phone] Fax: [phone]     Social Determinants of Health (SDOH) Interventions    Readmission Risk Interventions No flowsheet data found.

## 2019-12-24 NOTE — Progress Notes (Signed)
LVAD Coordinator Rounding Note:  Admitted to Dr. Clayborne Dana service on 12/22/19 due to abdominal pain with refractory nausea and vomiting due to lumbar compression fracture. [name]   HM II LVAD implanted on 04/06/13 by Dr. Cyndia Bent under Destination Therapy criteria due to advanced age.   Pt laying in bed this morning. States she is feeling better than yesterday. Rates pain 3/10. Denies nausea this morning, but states she does not have much of an appetite. Plan for bone scan today.   Vital signs: Temp:  98.7 HR: 80 Doppler Pressure: 90 Automatic BP: 118/84 (94) O2 Sat: 96% RA Wt: 164.9>162.7>153.4 lbs   LVAD interrogation reveals:  Speed:  8600 Flow:  3.7 Power:  4.2 w PI: 6.7 Alarms: none Events:  none  Fixed speed: 8600 Low speed limit: 8000  Drive Line:  C/D/I with anchor intact and accurately applied. Weekly dressing changes per bedside nurse or patient's husband.   Labs:  LDH trend: 203>219>202  INR trend: 3.6>3.1>2.8  Anticoagulation Plan: -INR Goal: 2.0 - 2.5 - holding warfarin for possible procedure -ASA Dose: none (allergy)  Device:  St Jude dual ICD - Pacing: DDD 60 -Therapies: on 200   Plan/Recommendations:  1. Call VAD Coordinator for any VAD equipment or drive line issues. 2. Weekly LVAD drive line dressing changes per bedside nurse or patient's husband.  Emerson Monte RN [place] Coordinator  Office: [phone]  24/7 Pager: [phone]

## 2019-12-25 DIAGNOSIS — S32000A Wedge compression fracture of unspecified lumbar vertebra, initial encounter for closed fracture: Secondary | ICD-10-CM | POA: Diagnosis not present

## 2019-12-25 DIAGNOSIS — I5022 Chronic systolic (congestive) heart failure: Secondary | ICD-10-CM | POA: Diagnosis not present

## 2019-12-25 DIAGNOSIS — Z95811 Presence of heart assist device: Secondary | ICD-10-CM | POA: Diagnosis not present

## 2019-12-25 DIAGNOSIS — E440 Moderate protein-calorie malnutrition: Secondary | ICD-10-CM | POA: Diagnosis not present

## 2019-12-25 LAB — CBC
HCT: 36.5 % (ref 36.0–46.0)
HCT: 33 % — ABNORMAL LOW (ref 36.0–46.0)
Hemoglobin: 11.5 g/dL — ABNORMAL LOW (ref 12.0–15.0)
Hemoglobin: 10.3 g/dL — ABNORMAL LOW (ref 12.0–15.0)
MCH: 31.1 pg (ref 26.0–34.0)
MCH: 31.1 pg (ref 26.0–34.0)
MCHC: 31.5 g/dL (ref 30.0–36.0)
MCHC: 31.2 g/dL (ref 30.0–36.0)
MCV: 98.6 fL (ref 80.0–100.0)
MCV: 99.7 fL (ref 80.0–100.0)
Platelets: 227 10*3/uL (ref 150–400)
Platelets: 210 10*3/uL (ref 150–400)
RBC: 3.7 MIL/uL — ABNORMAL LOW (ref 3.87–5.11)
RBC: 3.31 MIL/uL — ABNORMAL LOW (ref 3.87–5.11)
RDW: 14.4 % (ref 11.5–15.5)
RDW: 14.4 % (ref 11.5–15.5)
WBC: 7.5 10*3/uL (ref 4.0–10.5)
WBC: 6.7 10*3/uL (ref 4.0–10.5)
nRBC: 0.3 % — ABNORMAL HIGH (ref 0.0–0.2)
nRBC: 0 % (ref 0.0–0.2)

## 2019-12-25 LAB — BASIC METABOLIC PANEL
Anion gap: 11 (ref 5–15)
BUN: 7 mg/dL — ABNORMAL LOW (ref 8–23)
CO2: 24 mmol/L (ref 22–32)
Calcium: 9.1 mg/dL (ref 8.9–10.3)
Chloride: 102 mmol/L (ref 98–111)
Creatinine, Ser: 0.82 mg/dL (ref 0.44–1.00)
GFR calc Af Amer: >60 mL/min (ref >60)
GFR calc non Af Amer: >60 mL/min (ref >60)
Glucose, Bld: 95 mg/dL (ref 70–99)
Potassium: 4.1 mmol/L (ref 3.5–5.1)
Sodium: 137 mmol/L (ref 135–145)

## 2019-12-25 LAB — LACTATE DEHYDROGENASE: LDH: 215 U/L — ABNORMAL HIGH (ref 98–192)

## 2019-12-25 LAB — PROTIME-INR
INR: 2.4 — ABNORMAL HIGH (ref 0.8–1.2)
Prothrombin Time: 26.4 seconds — ABNORMAL HIGH (ref 11.4–15.2)

## 2019-12-25 MED ORDER — CLONIDINE HCL 0.1 MG PO TABS
0.1000 mg | ORAL_TABLET | ORAL | Status: DC | PRN
  Administered 2019-12-25: 0.1 mg via ORAL
  Filled 2019-12-25: qty 1

## 2019-12-25 MED ORDER — SODIUM CHLORIDE 0.9 % IV BOLUS
500.0000 mL | Freq: Once | INTRAVENOUS | Status: AC
  Administered 2019-12-25: 500 mL via INTRAVENOUS

## 2019-12-25 NOTE — Progress Notes (Signed)
Patient hadn't voided much urine today (12ml). Oral intake was poor today, bladder scan showed 201 ml. Patient denied that she didn't feel like to void at this time. Continue to monitor urine output. HS [employer]

## 2019-12-25 NOTE — Progress Notes (Signed)
Advanced Heart Failure VAD Team Note  PCP-Cardiologist: No primary care provider on file.   Subjective:   Admitted with N/V, abd pain due to lumbar compression fracture.   CT Lumbar 1. Acute to subacute compression fractures involving the T12, L1, L3, and L4 vertebral bodies as above. Associated up to 5 mm bony retropulsion at the level of L1 with no more than mild spinal stenosis. These fractures are benign/mechanical in appearance. 2. Multilevel degenerative spondylosis with resultant mild to moderate spinal stenosis at L2-3, L3-4 and L4-5, with mild to moderate lateral recess narrowing at L5-S1.  Feels a bit better on tramadol. Still with back pain and some n/v. Able to hold down breakfast.   Bone scan with multiple level compression fx. MAPs > 100  LVAD INTERROGATION:  HeartMate II LVAD:   Flow 3.2  liters/min, speed 8600, power 4.0  PI 6.4 VAD interrogated personally. Parameters stable.  Objective:    Vital Signs:   Temp:  [97.8 F (36.6 C)-98.3 F (36.8 C)] 97.9 F (36.6 C) (04/24 1549) Pulse Rate:  [59-79] 74 (04/24 1549) Resp:  [17-25] 17 (04/24 1549) BP: (123-130)/(76-90) 127/84 (04/24 1549) SpO2:  [93 %-99 %] 95 % (04/24 1549) Weight:  [69.8 kg] 69.8 kg (04/24 0557) Last BM Date: 12/24/19 Mean arterial Pressure 90-110s    Intake/Output:   Intake/Output Summary (Last 24 hours) at 12/25/2019 1559 Last data filed at 12/25/2019 0900 Gross per 24 hour  Intake 240 ml  Output 425 ml  Net -185 ml     Physical Exam   Physical Exam: General:  Sitting up in bed NAD.  HEENT: normal  Neck: supple. JVP not elevated.  Carotids 2+ bilat; no bruits. No lymphadenopathy or thryomegaly appreciated. Cor: LVAD hum.  Lungs: Clear. Abdomen: obese soft, nontender, non-distended. No hepatosplenomegaly. No bruits or masses. Good bowel sounds. Driveline site clean. Anchor in place.  Extremities: no cyanosis, clubbing, rash. Warm no edema  Neuro: alert & oriented x 3. No  focal deficits. Moves all 4 without problem    Telemetry   NSR 60-70s personally reviewed   EKG    N/A  Labs   Basic Metabolic Panel: Recent Labs  Lab 12/22/19 1106 12/22/19 1106 12/23/19 0314 12/23/19 2317 12/24/19 0259 12/25/19 0236  NA 139  --  141  --  138 137  K 4.1  --  3.9  --  3.6 4.1  CL 107  --  107  --  103 102  CO2 20*  --  23  --  25 24  GLUCOSE 118*  --  111*  --  101* 95  BUN 5*  --  6*  --  5* 7*  CREATININE 0.86  --  0.85  --  0.92 0.82  CALCIUM 9.6   < > 9.5  --  9.5 9.1  MG  --   --   --  1.9  --   --    < > = values in this interval not displayed.    Liver Function Tests: Recent Labs  Lab 12/22/19 1106  AST 25  ALT 19  ALKPHOS 120  BILITOT 0.3  PROT 9.2*  ALBUMIN 3.0*   Recent Labs  Lab 12/23/19 0314  LIPASE 19  AMYLASE 76   No results for input(s): AMMONIA in the last 168 hours.  CBC: Recent Labs  Lab 12/22/19 1106 12/23/19 0314 12/24/19 0259 12/25/19 0236  WBC 7.2 10.0 8.6 7.5  HGB 11.4* 11.1* 11.2* 11.5*  HCT 37.4 35.5* 36.2  36.5  MCV 101.4* 98.9 98.9 98.6  PLT 270 244 228 227    INR: Recent Labs  Lab 12/21/19 0000 12/22/19 1106 12/23/19 0314 12/24/19 0259 12/25/19 0236  INR 5.1* 3.6* 3.1* 2.8* 2.4*    Other results:      Imaging   NM Bone Scan Whole Body  Result Date: 12/24/2019 CLINICAL DATA:  Compression fracture. EXAM: NUCLEAR MEDICINE WHOLE BODY BONE SCAN TECHNIQUE: Whole body anterior and posterior images were obtained approximately 3 hours after intravenous injection of radiopharmaceutical. RADIOPHARMACEUTICALS:  21.6 mCi Technetium-39m MDP IV COMPARISON:  CT December 23 2019 FINDINGS: Abnormally increased radiotracer uptake within several thoracic and lumbosacral spine vertebral bodies with apparent associated compression deformities, mainly T10, T12, L1, L3, and L4. Otherwise mildly increased radiotracer uptake within the bilateral knees and shoulders, likely due to osteoarthritic changes. Physiologic  distribution within the kidneys. IMPRESSION: Abnormally increased radiotracer uptake with associated compression deformities within T10, T12, L1, L3 and L4, suggestive of compression fractures. Electronically Signed   By: Fidela Salisbury M.D.   On: 12/24/2019 16:08     Medications:     Scheduled Medications: . amiodarone  200 mg Oral Daily  . calcitonin (salmon)  1 spray Alternating Nares Daily  . carvedilol  6.25 mg Oral BID WC  . docusate sodium  100 mg Oral Daily  . feeding supplement (ENSURE ENLIVE)  237 mL Oral BID BM  . gabapentin  300 mg Oral BID  . hydrALAZINE  100 mg Oral TID  . losartan  100 mg Oral Daily  . pantoprazole sodium  20 mg Oral Daily    Infusions:   PRN Medications: acetaminophen, fentaNYL (SUBLIMAZE) injection, ondansetron (ZOFRAN) IV, promethazine, traMADol    Assessment/Plan:    1) Ab pain in setting of intractable n/v due to her back pain and pain meds - improving slowly.  - LFTs and amylase/lipase ok   2) Low back and left hip pain in setting of L1 compression fracture and new L3 fracture - Following with Dr. Lorin Mercy. Options limited - CT and bone scan with multilevel compression fx at T10, T12, L1, L3, and L4. - IR consulted following for possible vertebroplasty with Dr. Estanislado Pandy. Awaiting INR to fall.  - Continue pain control with tramadol. Add prn fentanyl for breakthrough - PT/OT following  3) Anorexia - Appetite improving - Continue Boost supplements  4) Chronic systolic HF: NICM, s/p ICD and LVAD for DT(04/2013).  - now 6+ years out on VAD support -  Typically NYHA II but now IIIb in setting of ortho issues - Volume status stable. Does not need diuretics.  - MAPs up. Can use PRN clonidine for MAP > 100  5) LVAD placed for DT 04/2013:  - Failed Entresto due to diuresis and incessant low flow alarms.  - VAD interrogated personally. Parameters stable. - No ASA with GI bleed and intolerance. - LDH 215 - INR 2.4.  Goal 1.8-2.3.    - Holding warfarin for vertebroplasty. Start low-dose heparin when INR < 2.0  6) PAF:  - Did not tolerate AF well at all.  - S/p DC-CV 06/18/19 - Maintaining NSR. Continue amio 200 daily.  - Management of warfarin as above - no change  7) HTN:  - MAPS up. Use clonidine for breakthrough HTN   I reviewed the LVAD parameters from today, and compared the results to the patient's prior recorded data.  No programming changes were made.  The LVAD is functioning within specified parameters.  The patient performs LVAD  self-test daily.  LVAD interrogation was negative for any significant power changes, alarms or PI events/speed drops.  LVAD equipment check completed and is in good working order.  Back-up equipment present.   LVAD education done on emergency procedures and precautions and reviewed exit site care.  Length of Stay: 3  Glori Bickers, MD 12/25/2019, 3:59 PM  VAD Team --- VAD ISSUES ONLY--- Pager [phone] (7am - 7am)  Advanced Heart Failure Team  Pager [phone] (M-F; 7a - 4p)  Please contact [facility] for night-coverage after hours (4p -7a ) and weekends on [email or web address]

## 2019-12-25 NOTE — Plan of Care (Signed)
  Problem: Nutrition: Goal: Adequate nutrition will be maintained Outcome: Progressing   Problem: Coping: Goal: Level of anxiety will decrease Outcome: Progressing   Problem: Pain Managment: Goal: General experience of comfort will improve Outcome: Progressing   Problem: Safety: Goal: Ability to remain free from injury will improve Outcome: Progressing   

## 2019-12-25 NOTE — Plan of Care (Signed)
  Problem: Education: Goal: Knowledge of General Education information will improve Description: Including pain rating scale, medication(s)/side effects and non-pharmacologic comfort measures Outcome: Progressing   Problem: Clinical Measurements: Goal: Ability to maintain clinical measurements within normal limits will improve Outcome: Progressing Goal: Respiratory complications will improve Outcome: Progressing Goal: Cardiovascular complication will be avoided Outcome: Progressing   Problem: Nutrition: Goal: Adequate nutrition will be maintained Outcome: Progressing

## 2019-12-25 NOTE — Progress Notes (Signed)
Low flow alarms, bp 42/17 (23), manual bp 70.  Molly, LVAD cooridinator called.  Orders to given 500ccbolus.  Bolus started. Will continue to monitor.  Low flow alarms continue.

## 2019-12-26 ENCOUNTER — Inpatient Hospital Stay (HOSPITAL_COMMUNITY): Payer: Medicare Other

## 2019-12-26 DIAGNOSIS — R112 Nausea with vomiting, unspecified: Secondary | ICD-10-CM | POA: Diagnosis not present

## 2019-12-26 DIAGNOSIS — I5022 Chronic systolic (congestive) heart failure: Secondary | ICD-10-CM | POA: Diagnosis not present

## 2019-12-26 DIAGNOSIS — S32000A Wedge compression fracture of unspecified lumbar vertebra, initial encounter for closed fracture: Secondary | ICD-10-CM | POA: Diagnosis not present

## 2019-12-26 DIAGNOSIS — Z95811 Presence of heart assist device: Secondary | ICD-10-CM | POA: Diagnosis not present

## 2019-12-26 DIAGNOSIS — I472 Ventricular tachycardia: Secondary | ICD-10-CM

## 2019-12-26 LAB — PROTIME-INR
INR: 2.5 — ABNORMAL HIGH (ref 0.8–1.2)
Prothrombin Time: 27.2 seconds — ABNORMAL HIGH (ref 11.4–15.2)

## 2019-12-26 LAB — BASIC METABOLIC PANEL
Anion gap: 6 (ref 5–15)
BUN: 11 mg/dL (ref 8–23)
CO2: 26 mmol/L (ref 22–32)
Calcium: 8.6 mg/dL — ABNORMAL LOW (ref 8.9–10.3)
Chloride: 102 mmol/L (ref 98–111)
Creatinine, Ser: 0.92 mg/dL (ref 0.44–1.00)
GFR calc Af Amer: >60 mL/min (ref >60)
GFR calc non Af Amer: 58 mL/min — ABNORMAL LOW (ref >60)
Glucose, Bld: 94 mg/dL (ref 70–99)
Potassium: 3.6 mmol/L (ref 3.5–5.1)
Sodium: 134 mmol/L — ABNORMAL LOW (ref 135–145)

## 2019-12-26 LAB — CBC
HCT: 37 % (ref 36.0–46.0)
Hemoglobin: 11.5 g/dL — ABNORMAL LOW (ref 12.0–15.0)
MCH: 31.2 pg (ref 26.0–34.0)
MCHC: 31.1 g/dL (ref 30.0–36.0)
MCV: 100.3 fL — ABNORMAL HIGH (ref 80.0–100.0)
Platelets: 218 10*3/uL (ref 150–400)
RBC: 3.69 MIL/uL — ABNORMAL LOW (ref 3.87–5.11)
RDW: 14.5 % (ref 11.5–15.5)
WBC: 6.1 10*3/uL (ref 4.0–10.5)
nRBC: 0 % (ref 0.0–0.2)

## 2019-12-26 LAB — LACTATE DEHYDROGENASE: LDH: 195 U/L — ABNORMAL HIGH (ref 98–192)

## 2019-12-26 MED ORDER — IOHEXOL 300 MG/ML  SOLN
100.0000 mL | Freq: Once | INTRAMUSCULAR | Status: AC | PRN
  Administered 2019-12-26: 100 mL via INTRAVENOUS

## 2019-12-26 MED ORDER — KCL IN DEXTROSE-NACL 40-5-0.45 MEQ/L-%-% IV SOLN
INTRAVENOUS | Status: AC
  Filled 2019-12-26: qty 1000

## 2019-12-26 MED ORDER — IOHEXOL 9 MG/ML PO SOLN: 500.0000 mL | ORAL | Status: AC

## 2019-12-26 MED ORDER — SODIUM CHLORIDE 0.9 % IV BOLUS
500.0000 mL | Freq: Once | INTRAVENOUS | Status: AC
  Administered 2019-12-26: 09:00:00 500 mL via INTRAVENOUS

## 2019-12-26 NOTE — Plan of Care (Signed)
  Problem: Education: Goal: Knowledge of General Education information will improve Description: Including pain rating scale, medication(s)/side effects and non-pharmacologic comfort measures Outcome: Progressing   Problem: Clinical Measurements: Goal: Will remain free from infection Outcome: Progressing Goal: Respiratory complications will improve Outcome: Progressing   Problem: Coping: Goal: Level of anxiety will decrease Outcome: Progressing   Problem: Elimination: Goal: Will not experience complications related to bowel motility Outcome: Progressing

## 2019-12-26 NOTE — Progress Notes (Signed)
BP was 93/68 MAP 77 doppler 98 and HR 70's. Notified [name] regarding this matter and hold 5 pm dose of coreg and hold hydralazine too. HS [employer]

## 2019-12-26 NOTE — Progress Notes (Signed)
Advanced Heart Failure VAD Team Note  PCP-Cardiologist: No primary care provider on file.   Subjective:   Admitted with N/V, abd pain due to lumbar compression fracture.   CT Lumbar 1. Acute to subacute compression fractures involving the T12, L1, L3, and L4 vertebral bodies as above. Associated up to 5 mm bony retropulsion at the level of L1 with no more than mild spinal stenosis. These fractures are benign/mechanical in appearance. 2. Multilevel degenerative spondylosis with resultant mild to moderate spinal stenosis at L2-3, L3-4 and L4-5, with mild to moderate lateral recess narrowing at L5-S1.  Continues with mid abdominal pain assistant nausea and vomiting.  Unable to hold any food down.  She did have a bowel movement this morning.  Denies melena or bright red blood per rectum.  Received clonidine last night for maps of 110 and developed multiple low flow alarms. Received 3 bolus on NS. Now improved.   This am had about 30 second run of VT (asympotmatic)  Bone scan with multiple level compression fx.   LVAD INTERROGATION:  HeartMate II LVAD:   Flow 3.4  liters/min, speed 8600, power 4.0  PI 7.0  VAD interrogated personally. Multiple low flow alarms. Last alarm at 850a.    Objective:    Vital Signs:   Temp:  [97.6 F (36.4 C)-98.2 F (36.8 C)] 98 F (36.7 C) (04/25 0843) Pulse Rate:  [62-79] 69 (04/25 0400) Resp:  [13-25] 13 (04/25 0843) BP: (34-146)/(17-92) 114/77 (04/25 0843) SpO2:  [93 %-96 %] 94 % (04/25 0843) Weight:  [71.8 kg] 71.8 kg (04/25 0300) Last BM Date: 12/24/19 Mean arterial Pressure 80-90s    Intake/Output:   Intake/Output Summary (Last 24 hours) at 12/26/2019 1102 Last data filed at 12/26/2019 0608 Gross per 24 hour  Intake 220 ml  Output 450 ml  Net -230 ml     Physical Exam   General:  Lying in bed. Weak NAD HEENT: normal  Neck: supple. JVP not elevated.  Carotids 2+ bilat; no bruits. No lymphadenopathy or thryomegaly  appreciated. Cor: LVAD hum.  Lungs: Clear. Abdomen: obese soft, + point tenderness in mid abdomen, non-distended.  No rebound . No hepatosplenomegaly. No bruits or masses. Good bowel sounds. Driveline site clean. Anchor in place.  Extremities: no cyanosis, clubbing, rash. Warm no edema  Neuro: alert & oriented x 3. No focal deficits. Moves all 4 without problem   Telemetry   NSR 60-70s about 30-s run of VT this am personally reviewed   EKG    N/A  Labs   Basic Metabolic Panel: Recent Labs  Lab 12/22/19 1106 12/22/19 1106 12/23/19 0314 12/23/19 0314 12/23/19 2317 12/24/19 0259 12/25/19 0236 12/26/19 0255  NA 139  --  141  --   --  138 137 134*  K 4.1  --  3.9  --   --  3.6 4.1 3.6  CL 107  --  107  --   --  103 102 102  CO2 20*  --  23  --   --  25 24 26   GLUCOSE 118*  --  111*  --   --  101* 95 94  BUN 5*  --  6*  --   --  5* 7* 11  CREATININE 0.86  --  0.85  --   --  0.92 0.82 0.92  CALCIUM 9.6   < > 9.5   < >  --  9.5 9.1 8.6*  MG  --   --   --   --  1.9  --   --   --    < > = values in this interval not displayed.    Liver Function Tests: Recent Labs  Lab 12/22/19 1106  AST 25  ALT 19  ALKPHOS 120  BILITOT 0.3  PROT 9.2*  ALBUMIN 3.0*   Recent Labs  Lab 12/23/19 0314  LIPASE 19  AMYLASE 76   No results for input(s): AMMONIA in the last 168 hours.  CBC: Recent Labs  Lab 12/23/19 0314 12/24/19 0259 12/25/19 0236 12/25/19 2310 12/26/19 0255  WBC 10.0 8.6 7.5 6.7 6.1  HGB 11.1* 11.2* 11.5* 10.3* 11.5*  HCT 35.5* 36.2 36.5 33.0* 37.0  MCV 98.9 98.9 98.6 99.7 100.3*  PLT 244 228 227 210 218    INR: Recent Labs  Lab 12/22/19 1106 12/23/19 0314 12/24/19 0259 12/25/19 0236 12/26/19 0255  INR 3.6* 3.1* 2.8* 2.4* 2.5*    Other results:      Imaging   NM Bone Scan Whole Body  Result Date: 12/24/2019 CLINICAL DATA:  Compression fracture. EXAM: NUCLEAR MEDICINE WHOLE BODY BONE SCAN TECHNIQUE: Whole body anterior and posterior images  were obtained approximately 3 hours after intravenous injection of radiopharmaceutical. RADIOPHARMACEUTICALS:  21.6 mCi Technetium-83m MDP IV COMPARISON:  CT December 23 2019 FINDINGS: Abnormally increased radiotracer uptake within several thoracic and lumbosacral spine vertebral bodies with apparent associated compression deformities, mainly T10, T12, L1, L3, and L4. Otherwise mildly increased radiotracer uptake within the bilateral knees and shoulders, likely due to osteoarthritic changes. Physiologic distribution within the kidneys. IMPRESSION: Abnormally increased radiotracer uptake with associated compression deformities within T10, T12, L1, L3 and L4, suggestive of compression fractures. Electronically Signed   By: Fidela Salisbury M.D.   On: 12/24/2019 16:08     Medications:     Scheduled Medications:  amiodarone  200 mg Oral Daily   calcitonin (salmon)  1 spray Alternating Nares Daily   carvedilol  6.25 mg Oral BID WC   docusate sodium  100 mg Oral Daily   feeding supplement (ENSURE ENLIVE)  237 mL Oral BID BM   gabapentin  300 mg Oral BID   hydrALAZINE  100 mg Oral TID   losartan  100 mg Oral Daily   pantoprazole sodium  20 mg Oral Daily    Infusions:  dextrose 5 % and 0.45 % NaCl with KCl 40 mEq/L      PRN Medications: acetaminophen, fentaNYL (SUBLIMAZE) injection, ondansetron (ZOFRAN) IV, promethazine, traMADol    Assessment/Plan:    1) Ab pain in setting of intractable n/v due to her back pain and pain meds - pain persists. Unable to hold food down. Central abdominal point tenderness - will check CT A/P  2) Low back and left hip pain in setting of L1 compression fracture and new L3 fracture - Following with Dr. Lorin Mercy. Options limited - CT and bone scan with multilevel compression fx at T10, T12, L1, L3, and L4. - IR consulted following for possible vertebroplasty with Dr. Estanislado Pandy. Awaiting INR to fall.  - Continue pain control with tramadol. Have added  prn fentanyl for breakthrough - PT/OT following  3) Anorexia - Unable to hold food down. Check CT A/P - Resume Boost supplements as tolerated  4) Chronic systolic HF: NICM, s/p ICD and LVAD for DT(04/2013).  - now 6+ years out on VAD support -  Typically NYHA II but now IIIb in setting of ortho issues - Volume status low. Received IVF overnight for low flows. Will start maintenance IVF  5) LVAD placed for DT 04/2013:  - Failed Entresto due to diuresis and incessant low flow alarms.  - VAD interrogated personally. Multiple low flow alarms. Last alarm at 850a.  - No ASA with GI bleed and intolerance. - LDH 195 - INR 2.5 Goal 1.8-2.3.  - Holding warfarin for vertebroplasty. Start low-dose heparin when INR < 2.0  6) PAF:  - Did not tolerate AF well at all.  - S/p DC-CV 06/18/19 - Maintaining NSR. Continue amio 200 daily.  - Management of warfarin as above - no change  7) HTN:  - MAPS up. Given clonidine yesterday but developed low flows. Will hold  8) VT - possibly due to suction event - starting IVF - keep K> 4.0 Mg > 2.0   I reviewed the LVAD parameters from today, and compared the results to the patient's prior recorded data.  No programming changes were made.  The LVAD is functioning within specified parameters.  The patient performs LVAD self-test daily.  LVAD interrogation was negative for any significant power changes, alarms or PI events/speed drops.  LVAD equipment check completed and is in good working order.  Back-up equipment present.   LVAD education done on emergency procedures and precautions and reviewed exit site care.  Length of Stay: [place], MD 12/26/2019, 11:02 AM  VAD Team --- VAD ISSUES ONLY--- Pager [phone] (7am - 7am)  Advanced Heart Failure Team  Pager [phone] (M-F; 7a - 4p)  Please contact [facility] for night-coverage after hours (4p -7a ) and weekends on [email or web address]

## 2019-12-26 NOTE — Plan of Care (Signed)
  Problem: Education: Goal: Knowledge of General Education information will improve Description: Including pain rating scale, medication(s)/side effects and non-pharmacologic comfort measures Outcome: Progressing   Problem: Health Behavior/Discharge Planning: Goal: Ability to manage health-related needs will improve Outcome: Progressing   Problem: Clinical Measurements: Goal: Respiratory complications will improve Outcome: Progressing Goal: Cardiovascular complication will be avoided Outcome: Progressing   Problem: Activity: Goal: Risk for activity intolerance will decrease Outcome: Progressing   Problem: Elimination: Goal: Will not experience complications related to bowel motility Outcome: Progressing Goal: Will not experience complications related to urinary retention Outcome: Progressing   Problem: Pain Managment: Goal: General experience of comfort will improve Outcome: Progressing

## 2019-12-26 NOTE — Progress Notes (Addendum)
Patient ID: Brianna Hanson, female   DOB: 04/02/1935, 85 y.o.   MRN: [id number] Today's PT/INR 27.2/2.5.  She is off Coumadin.  Patient updated on recent imaging findings and potential candidacy for vertebral body augmentation at T10, T12, L3 and L4.  She continues to have mid to lower back pain, exacerbated with movement.  Details/risks of procedure, including but not limited to, internal bleeding, infection, injury to adjacent structures, inability to eradicate back pain discussed with patient.  She appears interested in proceeding once PT/INR parameters are within acceptable limits.  We will continue to monitor and schedule accordingly.

## 2019-12-27 DIAGNOSIS — R112 Nausea with vomiting, unspecified: Secondary | ICD-10-CM | POA: Diagnosis not present

## 2019-12-27 DIAGNOSIS — S32000A Wedge compression fracture of unspecified lumbar vertebra, initial encounter for closed fracture: Secondary | ICD-10-CM | POA: Diagnosis not present

## 2019-12-27 DIAGNOSIS — Z95811 Presence of heart assist device: Secondary | ICD-10-CM | POA: Diagnosis not present

## 2019-12-27 DIAGNOSIS — I5022 Chronic systolic (congestive) heart failure: Secondary | ICD-10-CM | POA: Diagnosis not present

## 2019-12-27 LAB — CBC
HCT: 33.7 % — ABNORMAL LOW (ref 36.0–46.0)
Hemoglobin: 10.7 g/dL — ABNORMAL LOW (ref 12.0–15.0)
MCH: 30.9 pg (ref 26.0–34.0)
MCHC: 31.8 g/dL (ref 30.0–36.0)
MCV: 97.4 fL (ref 80.0–100.0)
Platelets: 209 10*3/uL (ref 150–400)
RBC: 3.46 MIL/uL — ABNORMAL LOW (ref 3.87–5.11)
RDW: 14.2 % (ref 11.5–15.5)
WBC: 6.2 10*3/uL (ref 4.0–10.5)
nRBC: 0 % (ref 0.0–0.2)

## 2019-12-27 LAB — PROTIME-INR
INR: 2 — ABNORMAL HIGH (ref 0.8–1.2)
Prothrombin Time: 22.7 seconds — ABNORMAL HIGH (ref 11.4–15.2)

## 2019-12-27 LAB — BASIC METABOLIC PANEL
Anion gap: 9 (ref 5–15)
BUN: <5 mg/dL — ABNORMAL LOW (ref 8–23)
CO2: 22 mmol/L (ref 22–32)
Calcium: 8.5 mg/dL — ABNORMAL LOW (ref 8.9–10.3)
Chloride: 104 mmol/L (ref 98–111)
Creatinine, Ser: 0.83 mg/dL (ref 0.44–1.00)
GFR calc Af Amer: >60 mL/min (ref >60)
GFR calc non Af Amer: >60 mL/min (ref >60)
Glucose, Bld: 115 mg/dL — ABNORMAL HIGH (ref 70–99)
Potassium: 3.9 mmol/L (ref 3.5–5.1)
Sodium: 135 mmol/L (ref 135–145)

## 2019-12-27 LAB — LACTATE DEHYDROGENASE: LDH: 179 U/L (ref 98–192)

## 2019-12-27 LAB — MAGNESIUM: Magnesium: 1.5 mg/dL — ABNORMAL LOW (ref 1.7–2.4)

## 2019-12-27 LAB — URINE CULTURE: Culture: 20000 — AB

## 2019-12-27 LAB — GLUCOSE, CAPILLARY: Glucose-Capillary: 112 mg/dL — ABNORMAL HIGH (ref 70–99)

## 2019-12-27 MED ORDER — MAGNESIUM SULFATE 4 GM/100ML IV SOLN
4.0000 g | Freq: Once | INTRAVENOUS | Status: AC
  Administered 2019-12-27: 4 g via INTRAVENOUS
  Filled 2019-12-27: qty 100

## 2019-12-27 NOTE — Progress Notes (Signed)
OT Cancellation Note  Patient Details Name: Brianna Hanson MRN: [id number] DOB: Jan 12, 1935   Cancelled Treatment:    Reason Eval/Treat Not Completed: Pain limiting ability to participate  Malka So 12/27/2019, 3:29 PM  Nestor Lewandowsky, OTR/L Acute Rehabilitation Services Pager: [phone] Office: [phone]

## 2019-12-27 NOTE — Progress Notes (Addendum)
Advanced Heart Failure VAD Team Note  PCP-Cardiologist: No primary care provider on file.   Subjective:   Admitted with N/V, abd pain due to lumbar compression fracture.   CT Lumbar 1. Acute to subacute compression fractures involving the T12, L1, L3, and L4 vertebral bodies as above. Associated up to 5 mm bony retropulsion at the level of L1 with no more than mild spinal stenosis. These fractures are benign/mechanical in appearance. 2. Multilevel degenerative spondylosis with resultant mild to moderate spinal stenosis at L2-3, L3-4 and L4-5, with mild to moderate lateral recess narrowing at L5-S1.  Bone scan with multiple level compression fx.   Received clonidine4/25  for maps of 110 and developed multiple low flow alarms. Received 3 bolus on NS. GIven 12 hours of IV fluids.   Complaining of n/v/abdominal pain. Had a hard time putting her teeth in this morning.   LVAD INTERROGATION:  HeartMate II LVAD:   Flow 3.4  liters/min, speed 8600, power 4.0  PI 7.0  No low flows over night.    Objective:    Vital Signs:   Temp:  [97.5 F (36.4 C)-98.7 F (37.1 C)] 98.2 F (36.8 C) (04/26 0744) Pulse Rate:  [71-80] 73 (04/26 0744) Resp:  [12-20] 17 (04/26 0744) BP: (93-135)/(68-94) 135/87 (04/26 0744) SpO2:  [92 %-98 %] 98 % (04/26 0744) Weight:  [74.9 kg] 74.9 kg (04/26 0455) Last BM Date: 12/26/19 Mean arterial Pressure 90-100s     Intake/Output:   Intake/Output Summary (Last 24 hours) at 12/27/2019 0857 Last data filed at 12/27/2019 0800 Gross per 24 hour  Intake 71.25 ml  Output 2100 ml  Net -2028.75 ml     Physical Exam   Physical Exam: GENERAL: Appears weak. No acute distress. HEENT: normal  NECK: Supple, JVP flat   .  2+ bilaterally, no bruits.  No lymphadenopathy or thyromegaly appreciated.   CARDIAC:  Mechanical heart sounds with LVAD hum present.  LUNGS:  Clear to auscultation bilaterally.  ABDOMEN:  Soft, round, nontender, positive bowel sounds x4.       LVAD exit site: well-healed and incorporated.  Dressing dry and intact.  No erythema or drainage.  Stabilization device present and accurately applied.  Driveline dressing is being changed daily per sterile technique. EXTREMITIES:  Warm and dry, no cyanosis, clubbing, rash or edema  NEUROLOGIC:  Alert and oriented x 3.   No aphasia.  No dysarthria.  Affect flat. MAE x4. .       Telemetry   NSR with brief episodes NSVT   EKG    N/A  Labs   Basic Metabolic Panel: Recent Labs  Lab 12/23/19 0314 12/23/19 0314 12/23/19 2317 12/24/19 0259 12/24/19 0259 12/25/19 0236 12/26/19 0255 12/27/19 0043  NA 141  --   --  138  --  137 134* 135  K 3.9  --   --  3.6  --  4.1 3.6 3.9  CL 107  --   --  103  --  102 102 104  CO2 23  --   --  25  --  24 26 22   GLUCOSE 111*  --   --  101*  --  95 94 115*  BUN 6*  --   --  5*  --  7* 11 <5*  CREATININE 0.85  --   --  0.92  --  0.82 0.92 0.83  CALCIUM 9.5   < >  --  9.5   < > 9.1 8.6* 8.5*  MG  --   --  1.9  --   --   --   --  1.5*   < > = values in this interval not displayed.    Liver Function Tests: Recent Labs  Lab 12/22/19 1106  AST 25  ALT 19  ALKPHOS 120  BILITOT 0.3  PROT 9.2*  ALBUMIN 3.0*   Recent Labs  Lab 12/23/19 0314  LIPASE 19  AMYLASE 76   No results for input(s): AMMONIA in the last 168 hours.  CBC: Recent Labs  Lab 12/24/19 0259 12/25/19 0236 12/25/19 2310 12/26/19 0255 12/27/19 0043  WBC 8.6 7.5 6.7 6.1 6.2  HGB 11.2* 11.5* 10.3* 11.5* 10.7*  HCT 36.2 36.5 33.0* 37.0 33.7*  MCV 98.9 98.6 99.7 100.3* 97.4  PLT 228 227 210 218 209    INR: Recent Labs  Lab 12/23/19 0314 12/24/19 0259 12/25/19 0236 12/26/19 0255 12/27/19 0043  INR 3.1* 2.8* 2.4* 2.5* 2.0*    Other results:      Imaging   CT ABDOMEN PELVIS W CONTRAST  Result Date: 12/26/2019 CLINICAL DATA:  Nausea and vomiting, abdominal pain EXAM: CT ABDOMEN AND PELVIS WITH CONTRAST TECHNIQUE: Multidetector CT imaging of the abdomen  and pelvis was performed using the standard protocol following bolus administration of intravenous contrast. CONTRAST:  130mL OMNIPAQUE IOHEXOL 300 MG/ML  SOLN COMPARISON:  09/05/2015, 12/24/2019 FINDINGS: Lower chest: Extensive hypoventilatory changes at the lung bases. Left ventricular assist device appears grossly stable. Hepatobiliary: No focal liver abnormality is seen. Status post cholecystectomy. No biliary dilatation. Pancreas: Unremarkable. No pancreatic ductal dilatation or surrounding inflammatory changes. Spleen: Normal in size without focal abnormality. Adrenals/Urinary Tract: Bilateral renal cortical thinning with multiple subcentimeter cortical cysts unchanged. No urinary tract calculi or obstructive uropathy. Bladder is unremarkable. The adrenals are normal. Stomach/Bowel: Scattered diverticulosis of the colon without diverticulitis. No bowel obstruction or ileus. Vascular/Lymphatic: Aortic atherosclerosis. No enlarged abdominal or pelvic lymph nodes. Reproductive: Status post hysterectomy. No adnexal masses. Other: No abdominal wall hernia or abnormality. No abdominopelvic ascites. Musculoskeletal: Compression fractures at T11, T12, L1, L3, and L4 are unchanged since recent CT and bone scan. No new fractures. Reconstructed images demonstrate no additional findings. IMPRESSION: 1. No acute intra-abdominal process to explain the patient's symptoms. 2. Diverticulosis without diverticulitis. 3. Stable compression fractures at T11, T12, L1, L3, and L4. 4. Aortic Atherosclerosis (ICD10-I70.0). Electronically Signed   By: Randa Ngo M.D.   On: 12/26/2019 19:27     Medications:     Scheduled Medications: . amiodarone  200 mg Oral Daily  . calcitonin (salmon)  1 spray Alternating Nares Daily  . carvedilol  6.25 mg Oral BID WC  . docusate sodium  100 mg Oral Daily  . feeding supplement (ENSURE ENLIVE)  237 mL Oral BID BM  . gabapentin  300 mg Oral BID  . hydrALAZINE  100 mg Oral TID  .  losartan  100 mg Oral Daily  . pantoprazole sodium  20 mg Oral Daily    Infusions:   PRN Medications: acetaminophen, fentaNYL (SUBLIMAZE) injection, ondansetron (ZOFRAN) IV, promethazine, traMADol    Assessment/Plan:    1) Ab pain in setting of intractable n/v due to her back pain and pain meds - pain persists. Unable to hold food down. Central abdominal point tenderness - CT abdomen/pelvis negative.   2) Low back and left hip pain in setting of L1 compression fracture and new L3 fracture - Following with Dr. Lorin Mercy. Options limited - CT and bone scan with multilevel compression fx at T10, T12, L1,  L3, and L4. - IR consulted following for possible vertebroplasty with Dr. Estanislado Pandy. Awaiting INR to fall.  - Continue pain control with tramadol. Have added prn fentanyl for breakthrough - PT/OT following  3) Anorexia - Unable to hold food down. Check CT A/P - Resume Boost supplements as tolerated  4) Chronic systolic HF: NICM, s/p ICD and LVAD for DT(04/2013).  - now 6+ years out on VAD support -  Typically NYHA II but now IIIb in setting of ortho issues - Volume status ok.  - No low flows after IV fluids.   5) LVAD placed for DT 04/2013:  - Failed Entresto due to diuresis and incessant low flow alarms.  - VAD interrogated personally. Multiple low flow alarms. Last alarm at 850a.  - No ASA with GI bleed and intolerance. - LDH 179 - INR 2.0 Goal 1.8-2.3.  - Holding warfarin for vertebroplasty. Start low-dose heparin when INR < 2.0  6) PAF:  - Did not tolerate AF well at all.  - S/p DC-CV 06/18/19 -- In NSR Continue amio 200 daily.  - Management of warfarin as above - no change  7) HTN:  - Maps elevated. ? Related to N/V -Continue current regimen.   8) VT - possibly due to suction event - starting IVF - keep K> 4.0 Mg > 2.0 - Mag 1.5. Give 4 grams of mag   I reviewed the LVAD parameters from today, and compared the results to the patient's prior recorded  data.  No programming changes were made.  The LVAD is functioning within specified parameters.  The patient performs LVAD self-test daily.  LVAD interrogation was negative for any significant power changes, alarms or PI events/speed drops.  LVAD equipment check completed and is in good working order.  Back-up equipment present.   LVAD education done on emergency procedures and precautions and reviewed exit site care.  Length of Stay: 5  Amy Clegg, NP 12/27/2019, 8:57 AM  VAD Team --- VAD ISSUES ONLY--- Pager [phone] (7am - 7am)  Advanced Heart Failure Team  Pager [phone] (M-F; 7a - 4p)  Please contact [facility] for night-coverage after hours (4p -7a ) and weekends on [email or web address]   Patient seen and examined with the above-signed Advanced Practice Provider and/or Housestaff. I personally reviewed laboratory data, imaging studies and relevant notes. I independently examined the patient and formulated the important aspects of the plan. I have edited the note to reflect any of my changes or salient points. I have personally discussed the plan with the patient and/or family.  Remains very weak. Still c/o ab pain and n/v. Unable to eat. CT a/p from yesterday reviewed personally and no acute process. Low flows improved with IVF. MAPs still up. Still with back pain .  General:  Weak appearing. Uncomfortable.  HEENT: normal  Neck: supple. JVP not elevated.  Carotids 2+ bilat; no bruits. No lymphadenopathy or thryomegaly appreciated. Cor: LVAD hum.  Lungs: Clear. Abdomen: obese soft, tender in mid abdominal region, non-distended. No hepatosplenomegaly. No bruits or masses. Good bowel sounds. Driveline site clean. Anchor in place.  Extremities: no cyanosis, clubbing, rash. Warm no edema  Neuro: alert & oriented x 3. No focal deficits. Moves all 4 without problem   Remains very weak. Continues with n/v unclear etiology. ? Related to back pain or pain meds. CT a/p unrevealing. Will continue IVF.  Adjust pain meds. Ok to let MAPs run a bit.   Holding coumadin for multi-level vertebroplasty when IN R < 2.0. Hopefully  tomorrow.   VAD interrogated personally. Parameters stable.  Glori Bickers, MD  12:18 PM

## 2019-12-27 NOTE — Progress Notes (Signed)
Physical Therapy Treatment Patient Details Name: Brianna Hanson MRN: [id number] DOB: 1934-09-09 Today's Date: 12/27/2019    History of Present Illness 85 yo admitted with N/V due to pain from back with T12, L1, L3, L4 compression fx. PMHx: HF, NICM, PAF, plasma cell disorder, LVAD heartmate II 04/06/13, lap chole    PT Comments    Pt supine on arrival reports she has been OOB for grossly 10 min all weekend limited by back pain. Pt encouraged to mobilize by Dr.[name] and pt willing to attempt. Pt barely made it to eOB and reported increase in pain from 4-10/10 and could not tolerate further activity with assist for return to supine and slide toward HOB. Educated for back precautions and continued mobility attempts. Will continue to follow with hope for increased mobility post kyphoplasty.     Follow Up Recommendations  Home health PT;Supervision/Assistance - 24 hour     Equipment Recommendations  None recommended by PT    Recommendations for Other Services       Precautions / Restrictions Precautions Precautions: Fall;Back;Other (comment) Precaution Comments: LVAD, back precautions for comfort with all discussed    Mobility  Bed Mobility Overal bed mobility: Needs Assistance Bed Mobility: Rolling;Sidelying to Sit;Sit to Supine Rolling: Mod assist Sidelying to sit: Mod assist   Sit to supine: Max assist   General bed mobility comments: pt initiated rolling with barely any movement of RUE adn reported pain. Pt able to roll fully to left side with significant assist and use of pad. Once in sidelying assist to move legs off EOB and elevate trunk. Pt with max assist to return to supine. Pt severely limited by pain and unable to tolerate further activity. total assist to slide toward [facility]  Transfers                 General transfer comment: pt declined due to pain  Ambulation/Gait                 Stairs             Wheelchair Mobility    Modified Rankin  (Stroke Patients Only)       Balance Overall balance assessment: Needs assistance Sitting-balance support: Bilateral upper extremity supported Sitting balance-Leahy Scale: Poor Sitting balance - Comments: pt propping on bil UE and eager to get out of sitting position                                    Cognition Arousal/Alertness: Suspect due to medications;Lethargic Behavior During Therapy: Flat affect Overall Cognitive Status: Impaired/Different from baseline Area of Impairment: Problem solving;Following commands                       Following Commands: Follows one step commands inconsistently     Problem Solving: Slow processing General Comments: pt appears lethargic, difficulty keeping eyes open, soft spoken and difficult to understand her words. Significant other [name] present and reports decreased cognition after Phenergan this am      Exercises      General Comments        Pertinent Vitals/Pain Pain Score: 10-Worst pain ever Pain Location: back with movement, 4 at rest initially Pain Descriptors / Indicators: Aching;Throbbing;Stabbing Pain Intervention(s): Limited activity within patient's tolerance;Monitored during session;Repositioned;Patient requesting pain meds-RN notified    Home Living  Prior Function            PT Goals (current goals can now be found in the care plan section) Progress towards PT goals: Not progressing toward goals - comment(limited by pain)    Frequency    Min 3X/week      PT Plan Current plan remains appropriate(pending progression post kyphoplasty)    Co-evaluation              AM-PAC PT "6 Clicks" Mobility   Outcome Measure  Help needed turning from your back to your side while in a flat bed without using bedrails?: A Lot Help needed moving from lying on your back to sitting on the side of a flat bed without using bedrails?: A Lot Help needed moving to and from  a bed to a chair (including a wheelchair)?: Total Help needed standing up from a chair using your arms (e.g., wheelchair or bedside chair)?: Total Help needed to walk in hospital room?: Total Help needed climbing 3-5 steps with a railing? : Total 6 Click Score: 8    End of Session   Activity Tolerance: Patient limited by pain Patient left: in bed;with call bell/phone within reach;with bed alarm set;with family/visitor present Nurse Communication: Mobility status PT Visit Diagnosis: Difficulty in walking, not elsewhere classified (R26.2);Other abnormalities of gait and mobility (R26.89)     Time: [id number] PT Time Calculation (min) (ACUTE ONLY): 18 min  Charges:  $Therapeutic Activity: 8-22 mins                     Brianna Hanson, PT Acute Rehabilitation Services Pager: [phone] Office: [phone]    Brianna Hanson 12/27/2019, 1:12 PM

## 2019-12-27 NOTE — Progress Notes (Signed)
Referring Physician(s): [name], Shaune Pascal  Supervising Physician: Luanne Bras  Patient Status:  [facility] - In-pt  Chief Complaint: "Back pain"  Subjective:  T10, T12, L3, and L4 compression fractures. Patient awake and alert laying in bed. Complains of low back pain, rated 10/10 at this time.   Allergies: Oxycodone  Medications: Prior to Admission medications   Medication Sig Start Date End Date Taking? Authorizing Provider  amiodarone (PACERONE) 200 MG tablet Take 1 tablet (200 mg total) by mouth daily. 05/31/19  Yes [name], Shaune Pascal, MD  carvedilol (COREG) 6.25 MG tablet TAKE 1 TABLET BY MOUTH 2 TIMES DAILY WITH A MEAL Patient taking differently: Take 6.25 mg by mouth 2 (two) times daily with a meal.  08/16/19  Yes [name], Shaune Pascal, MD  Cholecalciferol (VITAMIN D3) 25 MCG (1000 UT) CAPS Take 1,000 Units by mouth daily.    Yes [provider]  docusate sodium (COLACE) 100 MG capsule Take 100 mg by mouth daily.   Yes [provider]  ferrous sulfate 325 (65 FE) MG tablet Take 1 tablet (325 mg total) by mouth 2 (two) times daily with a meal. 07/11/15  Yes [name], Shaune Pascal, MD  furosemide (LASIX) 20 MG tablet TAKE 1 TABLET BY MOUTH EVERY DAY AS NEEDED ONLY WHEN RECOMMENDED Patient taking differently: Take 20 mg by mouth as needed (swelling).  12/07/19  Yes [name], Shaune Pascal, MD  gabapentin (NEURONTIN) 100 MG capsule Take 3 capsules (300 mg total) by mouth 2 (two) times daily. Patient taking differently: Take 100 mg by mouth at bedtime.  10/25/19  Yes [name], Shaune Pascal, MD  hydrALAZINE (APRESOLINE) 100 MG tablet TAKE 1 TABLET BY MOUTH THREE TIMES A DAY Patient taking differently: Take 100 mg by mouth 3 (three) times daily.  11/11/19  Yes [name], Shaune Pascal, MD  losartan (COZAAR) 100 MG tablet Take 1 tablet (100 mg total) by mouth daily. 06/15/19 12/22/19 Yes [name], Shaune Pascal, MD  ondansetron (ZOFRAN) 4 MG tablet Take 1 tablet (4 mg total) by mouth  every 8 (eight) hours as needed for nausea or vomiting. 12/22/19  Yes [name], Shaune Pascal, MD  pantoprazole (PROTONIX) 40 MG tablet Take 40 mg by mouth daily. 10/04/19  Yes [provider]  potassium chloride SA (KLOR-CON M20) 20 MEQ tablet Take 1 tablet (20 mEq total) by mouth 2 (two) times daily. 08/16/19  Yes [name], Shaune Pascal, MD  promethazine (PHENERGAN) 12.5 MG tablet Take 1 tablet (12.5 mg total) by mouth every 6 (six) hours as needed for nausea or vomiting. 12/22/19  Yes [name], Shaune Pascal, MD  traMADol (ULTRAM) 50 MG tablet Take 2 tablets (100 mg total) by mouth every 12 (twelve) hours as needed for moderate pain. 12/22/19  Yes [name], Shaune Pascal, MD  warfarin (COUMADIN) 2.5 MG tablet Take 1.25 mg (1/2 tab) every Friday and 2.5 mg (1 tab) all other days or as directed by heart failure clinic. Patient taking differently: Take 1.25-2.5 mg by mouth See admin instructions. Take 1.25 mg (1/2 tab) every Friday and 2.5 mg (1 tab) all other days or as directed by heart failure clinic. 12/21/19  Yes [name], Shaune Pascal, MD  cyclobenzaprine (FLEXERIL) 5 MG tablet TAKE 1 TABLET BY MOUTH TWICE A DAY AS NEEDED FOR MUSCLE SPASM Patient not taking: Reported on 12/22/2019 08/16/19   [name], Shaune Pascal, MD     Vital Signs: BP 127/88 (BP Location: Right Arm)   Pulse 77   Temp 98.5 F (36.9 C) (Oral)  Resp 19   Ht 5' (1.524 m)   Wt 165 lb 2 oz (74.9 kg)   SpO2 97%   BMI 32.25 kg/m   Physical Exam Vitals and nursing note reviewed.  Constitutional:      General: She is not in acute distress.    Appearance: Normal appearance.  Cardiovascular:     Rate and Rhythm: Normal rate and regular rhythm.     Heart sounds: Normal heart sounds. No murmur.  Pulmonary:     Effort: Pulmonary effort is normal. No respiratory distress.     Breath sounds: Normal breath sounds. No wheezing.  Musculoskeletal:     Comments: Moderate tenderness of midline lower spine.  Skin:    General: Skin is warm and  dry.  Neurological:     Mental Status: She is alert and oriented to person, place, and time.     Imaging: NM Bone Scan Whole Body  Result Date: 12/24/2019 CLINICAL DATA:  Compression fracture. EXAM: NUCLEAR MEDICINE WHOLE BODY BONE SCAN TECHNIQUE: Whole body anterior and posterior images were obtained approximately 3 hours after intravenous injection of radiopharmaceutical. RADIOPHARMACEUTICALS:  21.6 mCi Technetium-57m MDP IV COMPARISON:  CT December 23 2019 FINDINGS: Abnormally increased radiotracer uptake within several thoracic and lumbosacral spine vertebral bodies with apparent associated compression deformities, mainly T10, T12, L1, L3, and L4. Otherwise mildly increased radiotracer uptake within the bilateral knees and shoulders, likely due to osteoarthritic changes. Physiologic distribution within the kidneys. IMPRESSION: Abnormally increased radiotracer uptake with associated compression deformities within T10, T12, L1, L3 and L4, suggestive of compression fractures. Electronically Signed   By: Fidela Salisbury M.D.   On: 12/24/2019 16:08   CT ABDOMEN PELVIS W CONTRAST  Result Date: 12/26/2019 CLINICAL DATA:  Nausea and vomiting, abdominal pain EXAM: CT ABDOMEN AND PELVIS WITH CONTRAST TECHNIQUE: Multidetector CT imaging of the abdomen and pelvis was performed using the standard protocol following bolus administration of intravenous contrast. CONTRAST:  152mL OMNIPAQUE IOHEXOL 300 MG/ML  SOLN COMPARISON:  09/05/2015, 12/24/2019 FINDINGS: Lower chest: Extensive hypoventilatory changes at the lung bases. Left ventricular assist device appears grossly stable. Hepatobiliary: No focal liver abnormality is seen. Status post cholecystectomy. No biliary dilatation. Pancreas: Unremarkable. No pancreatic ductal dilatation or surrounding inflammatory changes. Spleen: Normal in size without focal abnormality. Adrenals/Urinary Tract: Bilateral renal cortical thinning with multiple subcentimeter cortical  cysts unchanged. No urinary tract calculi or obstructive uropathy. Bladder is unremarkable. The adrenals are normal. Stomach/Bowel: Scattered diverticulosis of the colon without diverticulitis. No bowel obstruction or ileus. Vascular/Lymphatic: Aortic atherosclerosis. No enlarged abdominal or pelvic lymph nodes. Reproductive: Status post hysterectomy. No adnexal masses. Other: No abdominal wall hernia or abnormality. No abdominopelvic ascites. Musculoskeletal: Compression fractures at T11, T12, L1, L3, and L4 are unchanged since recent CT and bone scan. No new fractures. Reconstructed images demonstrate no additional findings. IMPRESSION: 1. No acute intra-abdominal process to explain the patient's symptoms. 2. Diverticulosis without diverticulitis. 3. Stable compression fractures at T11, T12, L1, L3, and L4. 4. Aortic Atherosclerosis (ICD10-I70.0). Electronically Signed   By: Randa Ngo M.D.   On: 12/26/2019 19:27    Labs:  CBC: Recent Labs    12/25/19 0236 12/25/19 2310 12/26/19 0255 12/27/19 0043  WBC 7.5 6.7 6.1 6.2  HGB 11.5* 10.3* 11.5* 10.7*  HCT 36.5 33.0* 37.0 33.7*  PLT 227 210 218 209    COAGS: Recent Labs    12/24/19 0259 12/25/19 0236 12/26/19 0255 12/27/19 0043  INR 2.8* 2.4* 2.5* 2.0*    BMP: Recent  Labs    12/24/19 0259 12/25/19 0236 12/26/19 0255 12/27/19 0043  NA 138 137 134* 135  K 3.6 4.1 3.6 3.9  CL 103 102 102 104  CO2 25 24 26 22   GLUCOSE 101* 95 94 115*  BUN 5* 7* 11 <5*  CALCIUM 9.5 9.1 8.6* 8.5*  CREATININE 0.92 0.82 0.92 0.83  GFRNONAA 58* >60 58* >60  GFRAA >60 >60 >60 >60    LIVER FUNCTION TESTS: Recent Labs    04/08/19 0852 11/16/19 1001 12/22/19 1106  BILITOT 0.5 0.5 0.3  AST 27 27 25   ALT 23 19 19   ALKPHOS 83 115 120  PROT 9.2* 9.7* 9.2*  ALBUMIN 3.3* 3.1* 3.0*    Assessment and Plan:  T10, T12, L3, and L4 compression fractures. Plan for image-guided T10, T12, L3, L4 kyphoplasty/vertebroplasty tentatively for  Wednesday 12/29/2019 at 1030 with Dr. Estanislado Pandy pending INR 1.5 or less. Case has been reviewed by Dr. Estanislado Pandy who approves procedure. Insurance reviewed, no prior authentication needed. Patient currently with LVAD- per [name] and Ebony Hail, RN (LVAD coordinator) patient must have TEE probe on table under patient during procedure (to monitor LVAD), with both anesthesia (Dr. Ermalene Postin) and LVAD coordinator present for monitoring- all booked for Wednesday at 1030. Patient will be NPO at midnight prior to procedure. Afebrile. Continue to hold Coumadin and trend INR- must be 1.5 or less per Dr. Estanislado Pandy. UA and culture reviewed- ok to proceed per Dr. Estanislado Pandy.  Risks and benefits of T10, T12, L3, and L4 kyphoplasty/vertebroplasty were discussed with the patient including, but not limited to education regarding the natural healing process of compression fractures without intervention, bleeding, infection, cement migration which may cause spinal cord damage, paralysis, pulmonary embolism or even death. This interventional procedure involves the use of X-rays and because of the nature of the planned procedure, it is possible that we will have prolonged use of X-ray fluoroscopy. Potential radiation risks to you include (but are not limited to) the following: - A slightly elevated risk for cancer  several years later in life. This risk is typically less than 0.5% percent. This risk is low in comparison to the normal incidence of human cancer, which is 33% for women and 50% for men according to the [employer]. - Radiation induced injury can include skin redness, resembling a rash, tissue breakdown / ulcers and hair loss (which can be temporary or permanent).  The likelihood of either of these occurring depends on the difficulty of the procedure and whether you are sensitive to radiation due to previous procedures, disease, or genetic conditions.  IF your procedure requires a prolonged use of  radiation, you will be notified and given written instructions for further action.  It is your responsibility to monitor the irradiated area for the 2 weeks following the procedure and to notify your physician if you are concerned that you have suffered a radiation induced injury.   All of the patient's questions were answered, patient is agreeable to proceed. Consent signed and in IR control room.   Electronically Signed: Earley Abide, PA-C 12/27/2019, 3:14 PM   I spent a total of 35 Minutes at the the patient's bedside AND on the patient's hospital floor or unit, greater than 50% of which was counseling/coordinating care for T10, T12, L3, and L4 compression fractures.

## 2019-12-27 NOTE — Progress Notes (Signed)
LVAD Coordinator Rounding Note:  Admitted to Dr. Clayborne Dana service on 12/22/19 due to abdominal pain with refractory nausea and vomiting due to lumbar compression fracture. Brianna Hanson   HM II LVAD implanted on 04/06/13 by Dr. Cyndia Bent under Destination Therapy criteria due to advanced age.   Pt laying in bed this morning. Has been nauseated this morning. Had good appetite yesterday, but vomited yesterday evening. Plan for possible kyphoplasty once INR 1.5.   Vital signs: Temp:  98.5 HR: 68 Doppler Pressure: 108 Automatic BP: 138/84 (99)  O2 Sat: 95% RA Wt: 164.9>162.7>153.4>165.1 lbs   LVAD interrogation reveals:  Speed:  8600 Flow:  3.6 Power:  4.1 w PI: 6.7 Alarms: no low flows today; 100 + low flows yesterday Events:  none  Fixed speed: 8600 Low speed limit: 8000  Drive Line:  C/D/I with anchor intact and accurately applied. Weekly dressing changes per bedside nurse or patient's husband.   Labs:  LDH trend: 203>219>202>179  INR trend: 3.6>3.1>2.8>2.0  Anticoagulation Plan: -INR Goal: 2.0 - 2.5 - holding warfarin for possible procedure -ASA Dose: none (allergy)  Device:  St Jude dual ICD - Pacing: DDD 60 -Therapies: on 200   Plan/Recommendations:  1. Call VAD Coordinator for any VAD equipment or drive line issues. 2. Weekly LVAD drive line dressing changes per bedside nurse or patient's husband.  Emerson Monte RN [place] Coordinator  Office: [phone]  24/7 Pager: [phone]

## 2019-12-28 ENCOUNTER — Encounter (HOSPITAL_COMMUNITY): Payer: Medicare Other

## 2019-12-28 ENCOUNTER — Encounter (HOSPITAL_COMMUNITY): Payer: Self-pay | Admitting: Internal Medicine

## 2019-12-28 DIAGNOSIS — Z95811 Presence of heart assist device: Secondary | ICD-10-CM | POA: Diagnosis not present

## 2019-12-28 DIAGNOSIS — E440 Moderate protein-calorie malnutrition: Secondary | ICD-10-CM | POA: Diagnosis not present

## 2019-12-28 DIAGNOSIS — I5022 Chronic systolic (congestive) heart failure: Secondary | ICD-10-CM | POA: Diagnosis not present

## 2019-12-28 DIAGNOSIS — S32000A Wedge compression fracture of unspecified lumbar vertebra, initial encounter for closed fracture: Secondary | ICD-10-CM | POA: Diagnosis not present

## 2019-12-28 LAB — CBC
HCT: 35.6 % — ABNORMAL LOW (ref 36.0–46.0)
Hemoglobin: 11.2 g/dL — ABNORMAL LOW (ref 12.0–15.0)
MCH: 30.6 pg (ref 26.0–34.0)
MCHC: 31.5 g/dL (ref 30.0–36.0)
MCV: 97.3 fL (ref 80.0–100.0)
Platelets: 207 10*3/uL (ref 150–400)
RBC: 3.66 MIL/uL — ABNORMAL LOW (ref 3.87–5.11)
RDW: 14.2 % (ref 11.5–15.5)
WBC: 6.5 10*3/uL (ref 4.0–10.5)
nRBC: 0 % (ref 0.0–0.2)

## 2019-12-28 LAB — LACTATE DEHYDROGENASE: LDH: 177 U/L (ref 98–192)

## 2019-12-28 LAB — BASIC METABOLIC PANEL
Anion gap: 10 (ref 5–15)
BUN: <5 mg/dL — ABNORMAL LOW (ref 8–23)
CO2: 21 mmol/L — ABNORMAL LOW (ref 22–32)
Calcium: 8.4 mg/dL — ABNORMAL LOW (ref 8.9–10.3)
Chloride: 103 mmol/L (ref 98–111)
Creatinine, Ser: 0.89 mg/dL (ref 0.44–1.00)
GFR calc Af Amer: >60 mL/min (ref >60)
GFR calc non Af Amer: 60 mL/min — ABNORMAL LOW (ref >60)
Glucose, Bld: 97 mg/dL (ref 70–99)
Potassium: 3 mmol/L — ABNORMAL LOW (ref 3.5–5.1)
Sodium: 134 mmol/L — ABNORMAL LOW (ref 135–145)

## 2019-12-28 LAB — PROTIME-INR
INR: 1.5 — ABNORMAL HIGH (ref 0.8–1.2)
Prothrombin Time: 18.1 seconds — ABNORMAL HIGH (ref 11.4–15.2)

## 2019-12-28 LAB — GLUCOSE, CAPILLARY: Glucose-Capillary: 126 mg/dL — ABNORMAL HIGH (ref 70–99)

## 2019-12-28 LAB — MAGNESIUM: Magnesium: 2.3 mg/dL (ref 1.7–2.4)

## 2019-12-28 LAB — HEPARIN LEVEL (UNFRACTIONATED): Heparin Unfractionated: <0.1 IU/mL — ABNORMAL LOW (ref 0.30–0.70)

## 2019-12-28 MED ORDER — POTASSIUM CHLORIDE CRYS ER 20 MEQ PO TBCR
30.0000 meq | EXTENDED_RELEASE_TABLET | ORAL | Status: AC
  Administered 2019-12-28 (×2): 30 meq via ORAL
  Filled 2019-12-28 (×2): qty 1

## 2019-12-28 MED ORDER — HEPARIN (PORCINE) 25000 UT/250ML-% IV SOLN
500.0000 [IU]/h | INTRAVENOUS | Status: AC
  Administered 2019-12-28: 500 [IU]/h via INTRAVENOUS
  Filled 2019-12-28: qty 250

## 2019-12-28 NOTE — Progress Notes (Signed)
[place] for heparin Indication: hx LVAD  Allergies  Allergen Reactions  . Oxycodone Other (See Comments)    Hallucinations     Patient Measurements: Height: 5' (152.4 cm) Weight: 72.5 kg (159 lb 13.3 oz) IBW/kg (Calculated) : 45.5 Heparin Dosing Weight: 61.6 kg   Vital Signs: Temp: 97.9 F (36.6 C) (04/27 1555) Temp Source: Oral (04/27 1555) BP: 99/81 (04/27 1555) Pulse Rate: 75 (04/27 1555)  Labs: Recent Labs    12/26/19 0255 12/26/19 0255 12/27/19 0043 12/28/19 0238 12/28/19 1814  HGB 11.5*   < > 10.7* 11.2*  --   HCT 37.0  --  33.7* 35.6*  --   PLT 218  --  209 207  --   LABPROT 27.2*  --  22.7* 18.1*  --   INR 2.5*  --  2.0* 1.5*  --   HEPARINUNFRC  --   --   --   --  <0.10*  CREATININE 0.92  --  0.83 0.89  --    < > = values in this interval not displayed.    Estimated Creatinine Clearance: 42.6 mL/min (by C-G formula based on SCr of 0.89 mg/dL).   Medical History: Past Medical History:  Diagnosis Date  . AICD (automatic cardioverter/defibrillator) present   . Anemia   . Atrial fibrillation or flutter   . Cardiac arrest - ventricular fibrillation 12/10   with successful resucitation, S/p ICD  . CHF (congestive heart failure) ([place])   . Diverticula of colon 2011  . HTN (hypertension)    moderate  . ICD (implantable cardiac defibrillator) in place    she has received appropriate therapy for VF  . Internal and external hemorrhoids without complication [id number]  . Nonischemic cardiomyopathy ([place])    [facility] Dr April Holding at [facility]  . Osteopenia   . Plasma cell disorder 03/20/2012  . Plasma cell disorder 03/20/2012  . Sessile colonic polyp 2011   Dr Benson Norway    Medications:  Scheduled:  . amiodarone  200 mg Oral Daily  . calcitonin (salmon)  1 spray Alternating Nares Daily  . carvedilol  6.25 mg Oral BID WC  . docusate sodium  100 mg Oral Daily  . feeding supplement (ENSURE ENLIVE)  237 mL Oral BID BM  .  gabapentin  300 mg Oral BID  . hydrALAZINE  100 mg Oral TID  . losartan  100 mg Oral Daily  . pantoprazole sodium  20 mg Oral Daily    Assessment: 39 yof with HM2 LVAD, presenting with nausea/vomiting due to multilevel lumbar compression fracture - awaiting vertebroplasty when INR appropriate. Warfarin held and low-dose heparin started. Per pharmacist discussion with MD, will check heparin level to ensure level not high but will not adjust if low. INR today decreased from 2 to 1.5 today. Hgb 11.2, plt 207. LDH 177.  Heparin level undetectable tonight. Will not adjust per previous pharmacist discussion with MD. No current active bleed issues reported.   Goal of Therapy:  Heparin level 0.3-0.5 units/ml INR 1.8-2.3 Monitor platelets by anticoagulation protocol: Yes   Plan:  Continue low-dose heparin infusion at 500 units/hr Monitor daily heparin level and CBC, s/sx bleeding F/u Surgery plan   Arturo Morton, PharmD, BCPS Please check AMION for all [place] contact numbers Clinical Pharmacist 12/28/2019 7:45 PM

## 2019-12-28 NOTE — Progress Notes (Signed)
ANTICOAGULATION CONSULT NOTE - Initial Consult  Pharmacy Consult for heparin Indication: hx LVAD  Allergies  Allergen Reactions  . Oxycodone Other (See Comments)    Hallucinations     Patient Measurements: Height: 5' (152.4 cm) Weight: 72.5 kg (159 lb 13.3 oz) IBW/kg (Calculated) : 45.5 Heparin Dosing Weight: 61.6 kg   Vital Signs: Temp: 97.9 F (36.6 C) (04/27 0744) Temp Source: Oral (04/27 0744) BP: 96/77 (04/27 0744) Pulse Rate: 73 (04/27 0744)  Labs: Recent Labs    12/26/19 0255 12/26/19 0255 12/27/19 0043 12/28/19 0238  HGB 11.5*   < > 10.7* 11.2*  HCT 37.0  --  33.7* 35.6*  PLT 218  --  209 207  LABPROT 27.2*  --  22.7* 18.1*  INR 2.5*  --  2.0* 1.5*  CREATININE 0.92  --  0.83 0.89   < > = values in this interval not displayed.    Estimated Creatinine Clearance: 42.6 mL/min (by C-G formula based on SCr of 0.89 mg/dL).   Medical History: Past Medical History:  Diagnosis Date  . AICD (automatic cardioverter/defibrillator) present   . Anemia   . Atrial fibrillation or flutter   . Cardiac arrest - ventricular fibrillation 12/10   with successful resucitation, S/p ICD  . CHF (congestive heart failure) ([employer])   . Diverticula of colon 2011  . HTN (hypertension)    moderate  . ICD (implantable cardiac defibrillator) in place    she has received appropriate therapy for VF  . Internal and external hemorrhoids without complication [id number]  . Nonischemic cardiomyopathy ([place])    [facility]  . Osteopenia   . Plasma cell disorder 03/20/2012  . Plasma cell disorder 03/20/2012  . Sessile colonic polyp 2011   Dr Benson Norway    Medications:  Scheduled:  . amiodarone  200 mg Oral Daily  . calcitonin (salmon)  1 spray Alternating Nares Daily  . carvedilol  6.25 mg Oral BID WC  . docusate sodium  100 mg Oral Daily  . feeding supplement (ENSURE ENLIVE)  237 mL Oral BID BM  . gabapentin  300 mg Oral BID  . hydrALAZINE  100 mg Oral TID  . losartan   100 mg Oral Daily  . pantoprazole sodium  20 mg Oral Daily  . potassium chloride  30 mEq Oral Q4H    Assessment: 89 yof with HM2 LVAD, presenting with nausea/vomiting due to multilevel lumbar compression fracture - awaiting vertebroplasty.   INR today decreased from 2 to 1.5 today. Hgb 11.2, plt 207. LDH 177.   Plan for surgery possibly tomorrow.   PTA warfarin regimen: 2.5 mg daily except 1.25 mg on Friday.   Goal of Therapy:  Heparin level 0.3-0.5 units/ml INR 2-2.5 Monitor platelets by anticoagulation protocol: Yes   Plan:  Start heparin infusion at 500 units/hr Check anti-Xa level in 8 hours and daily while on heparin Continue to monitor H&H and platelets  Antonietta Jewel, PharmD, [place] Pharmacist  Phone: [phone] 12/28/2019 8:06 AM  Please check AMION for all [place] phone numbers After 10:00 PM, call [place] [phone]

## 2019-12-28 NOTE — Progress Notes (Signed)
LVAD Coordinator Rounding Note:  Admitted to Dr. Clayborne Dana service on 12/22/19 due to abdominal pain with refractory nausea and vomiting due to lumbar compression fracture. [name]   HM II LVAD implanted on 04/06/13 by Dr. Cyndia Bent under Destination Therapy criteria due to advanced age.   Pt laying in bed this morning. States she is feeling much better this morning. She is hopeful the procedure tomorrow will help with her back pain.   Vital signs: Temp:  98.3 HR: 82 Doppler Pressure: 98 Automatic BP: 103/78 (87) O2 Sat: 94% RA Wt: 164.9>162.7>153.4>165.1>159.8 lbs   LVAD interrogation reveals:  Speed:  8600 Flow:  3.4 Power:  4.1 w PI: 7.0 Alarms:1 low flow this morning; 1 overnight Events: 5 PI events  Fixed speed: 8600 Low speed limit: 8000  Drive Line:  C/D/I with anchor intact and accurately applied. Weekly dressing changes per bedside nurse or patient's husband.   Labs:  LDH trend: 203>219>202>179>177  INR trend: 3.6>3.1>2.8>2.0>1.5  Anticoagulation Plan: -INR Goal: 2.0 - 2.5 - holding warfarin for possible procedure -ASA Dose: none (allergy)  Device:  St Jude dual ICD - Pacing: DDD 60 -Therapies: on 200   Plan/Recommendations:  1. Call VAD Coordinator for any VAD equipment or drive line issues. 2. Weekly LVAD drive line dressing changes per bedside nurse or patient's husband. 3. VAD coordinator will accompany to procedure tomorrow  Emerson Monte RN [place] Coordinator  Office: [phone]  24/7 Pager: [phone]

## 2019-12-28 NOTE — Progress Notes (Addendum)
Advanced Heart Failure VAD Team Note  PCP-Cardiologist: No primary care provider on file.   Subjective:   85 y/o female w/ NICM, s/p ICD and LVAD for DT(04/2013), Admitted with N/V, abd pain due to multilevel lumbar compression fractures. Awaiting multi-level vertebroplasty.   INR 1.5 today. Surgery posted for tomorrow.   Received clonidine 4/25  for MAPs of 110 and developed multiple low flow alarms. Received 3 bolus on NS. GIven 12 hours of IV fluids. MAPs improved and stable in the 80s. No further low flow alarms.   Had nausea and vomiting overnight, that improved after 1 dose of Zofran  Resting comfortably in bed currently. No complaints.    LVAD INTERROGATION:  HeartMate II LVAD:   Flow 3.5  liters/min, speed 8600, power 4.0  PI 7.1  No low flows over night.  3 PI events, lowest PI was 2.9   Objective:    Vital Signs:   Temp:  [98.1 F (36.7 C)-98.7 F (37.1 C)] 98.5 F (36.9 C) (04/27 0442) Pulse Rate:  [67-81] 73 (04/27 0442) Resp:  [17-24] 17 (04/27 0442) BP: (100-138)/(74-88) 100/77 (04/27 0442) SpO2:  [91 %-100 %] 98 % (04/27 0442) Weight:  [72.5 kg] 72.5 kg (04/27 0442) Last BM Date: 12/26/19 Mean arterial Pressure 90-100s     Intake/Output:   Intake/Output Summary (Last 24 hours) at 12/28/2019 [id number] Last data filed at 12/28/2019 0300 Gross per 24 hour  Intake 490 ml  Output 1250 ml  Net -760 ml     Physical Exam   PHYSICAL EXAM: General:  Elderly female laying in bed. No respiratory difficulty HEENT: normal Neck: supple. no JVD. Carotids 2+ bilat; no bruits. No lymphadenopathy or thyromegaly appreciated. Cor: + LVAD Hum Lungs: clear Abdomen: soft, nontender, nondistended. No hepatosplenomegaly. No bruits or masses. Good bowel sounds. Drive-line Site: stable. No drainage or erythema   Extremities: no cyanosis, clubbing, rash, edema Neuro: alert & oriented x 3, cranial nerves grossly intact. moves all 4 extremities w/o difficulty. Affect  pleasant.   Telemetry   NSR mid 70s. No recurrent NSVT  EKG    No new EKG to review   Labs   Basic Metabolic Panel: Recent Labs  Lab 12/23/19 0314 12/23/19 2317 12/24/19 0259 12/24/19 0259 12/25/19 0236 12/25/19 0236 12/26/19 0255 12/27/19 0043 12/28/19 0238  NA   < >  --  138  --  137  --  134* 135 134*  K   < >  --  3.6  --  4.1  --  3.6 3.9 3.0*  CL   < >  --  103  --  102  --  102 104 103  CO2   < >  --  25  --  24  --  26 22 21*  GLUCOSE   < >  --  101*  --  95  --  94 115* 97  BUN   < >  --  5*  --  7*  --  11 <5* <5*  CREATININE   < >  --  0.92  --  0.82  --  0.92 0.83 0.89  CALCIUM   < >  --  9.5   < > 9.1   < > 8.6* 8.5* 8.4*  MG  --  1.9  --   --   --   --   --  1.5* 2.3   < > = values in this interval not displayed.    Liver Function Tests: Recent Labs  Lab 12/22/19 1106  AST 25  ALT 19  ALKPHOS 120  BILITOT 0.3  PROT 9.2*  ALBUMIN 3.0*   Recent Labs  Lab 12/23/19 0314  LIPASE 19  AMYLASE 76   No results for input(s): AMMONIA in the last 168 hours.  CBC: Recent Labs  Lab 12/25/19 0236 12/25/19 2310 12/26/19 0255 12/27/19 0043 12/28/19 0238  WBC 7.5 6.7 6.1 6.2 6.5  HGB 11.5* 10.3* 11.5* 10.7* 11.2*  HCT 36.5 33.0* 37.0 33.7* 35.6*  MCV 98.6 99.7 100.3* 97.4 97.3  PLT 227 210 218 209 207    INR: Recent Labs  Lab 12/24/19 0259 12/25/19 0236 12/26/19 0255 12/27/19 0043 12/28/19 0238  INR 2.8* 2.4* 2.5* 2.0* 1.5*    Other results:      Imaging   CT ABDOMEN PELVIS W CONTRAST  Result Date: 12/26/2019 CLINICAL DATA:  Nausea and vomiting, abdominal pain EXAM: CT ABDOMEN AND PELVIS WITH CONTRAST TECHNIQUE: Multidetector CT imaging of the abdomen and pelvis was performed using the standard protocol following bolus administration of intravenous contrast. CONTRAST:  164mL OMNIPAQUE IOHEXOL 300 MG/ML  SOLN COMPARISON:  09/05/2015, 12/24/2019 FINDINGS: Lower chest: Extensive hypoventilatory changes at the lung bases. Left  ventricular assist device appears grossly stable. Hepatobiliary: No focal liver abnormality is seen. Status post cholecystectomy. No biliary dilatation. Pancreas: Unremarkable. No pancreatic ductal dilatation or surrounding inflammatory changes. Spleen: Normal in size without focal abnormality. Adrenals/Urinary Tract: Bilateral renal cortical thinning with multiple subcentimeter cortical cysts unchanged. No urinary tract calculi or obstructive uropathy. Bladder is unremarkable. The adrenals are normal. Stomach/Bowel: Scattered diverticulosis of the colon without diverticulitis. No bowel obstruction or ileus. Vascular/Lymphatic: Aortic atherosclerosis. No enlarged abdominal or pelvic lymph nodes. Reproductive: Status post hysterectomy. No adnexal masses. Other: No abdominal wall hernia or abnormality. No abdominopelvic ascites. Musculoskeletal: Compression fractures at T11, T12, L1, L3, and L4 are unchanged since recent CT and bone scan. No new fractures. Reconstructed images demonstrate no additional findings. IMPRESSION: 1. No acute intra-abdominal process to explain the patient's symptoms. 2. Diverticulosis without diverticulitis. 3. Stable compression fractures at T11, T12, L1, L3, and L4. 4. Aortic Atherosclerosis (ICD10-I70.0). Electronically Signed   By: Randa Ngo M.D.   On: 12/26/2019 19:27     Medications:     Scheduled Medications: . amiodarone  200 mg Oral Daily  . calcitonin (salmon)  1 spray Alternating Nares Daily  . carvedilol  6.25 mg Oral BID WC  . docusate sodium  100 mg Oral Daily  . feeding supplement (ENSURE ENLIVE)  237 mL Oral BID BM  . gabapentin  300 mg Oral BID  . hydrALAZINE  100 mg Oral TID  . losartan  100 mg Oral Daily  . pantoprazole sodium  20 mg Oral Daily    Infusions:   PRN Medications: acetaminophen, fentaNYL (SUBLIMAZE) injection, ondansetron (ZOFRAN) IV, promethazine, traMADol    Assessment/Plan:    1) Ab pain in setting of intractable n/v due to  her back pain and pain meds - CT abdomen/pelvis negative.  - pain improved today but continues w/ n/v, continue PRN Zofran    2) Low back and left hip pain in setting of L1 compression fracture and new L3 fracture - Following with Dr. Lorin Mercy. Options limited - CT and bone scan with multilevel compression fx at T10, T12, L1, L3, and L4. - IR consulted and following for vertebroplasty with Dr. Estanislado Pandy. INR now <2.0. Procedure scheduled for 4/28.  - Continue pain control with tramadol. Have added prn fentanyl for breakthrough -  PT/OT following  3) Anorexia - Unable to hold food down.  - CT A/P unremarkable - HFTs ok  - Continue Boost supplements as tolerated - Continue PRN Zofran for N/V  4) Chronic systolic HF: NICM, s/p ICD and LVAD for DT(04/2013).  - now 6+ years out on VAD support -  Typically NYHA II but now IIIb in setting of ortho issues - Volume status ok.  - No further low flows after IV fluids.   5) LVAD placed for DT 04/2013:  - Failed Entresto due to diuresis and incessant low flow alarms.  - VAD interrogated personally. No recurrent low flow alarms  - No ASA with GI bleed and intolerance. - LDH 177 - INR 1.5 Goal 1.8-2.3. - Holding warfarin for vertebroplasty. Start low-dose heparin. Pharmacy to dose  6) PAF:  - Did not tolerate AF well at all.  - S/p DC-CV 06/18/19 - In NSR on tele - Continue amio 200 daily.  - Management of warfarin as above - no change  7) HTN:  - Maps elevated this admission, possibly due to pain, but improved today in the 80s -Continue current regimen.   8) VT - possibly due to suction event - no further VT after IVFs - keep K> 4.0 Mg > 2.0 - Mag 2.3. K 3.0 today (will give supp KCl)    Length of Stay: [address], PA-C 12/28/2019, 7:12 AM  VAD Team --- VAD ISSUES ONLY--- Pager [phone] ([place])  Advanced Heart Failure Team  Pager [phone] (M-F; 7a - 4p)  Please contact [facility] for night-coverage  after hours (4p -7a ) and weekends on [email or web address]  Patient seen and examined with the above-signed Advanced Practice Provider and/or Housestaff. I personally reviewed laboratory data, imaging studies and relevant notes. I independently examined the patient and formulated the important aspects of the plan. I have edited the note to reflect any of my changes or salient points. I have personally discussed the plan with the patient and/or family.  Feeling better today. Ab pain and n/v much improved. Able to hold down food. Had normal BM. MAPs improving. Off coumadin and INR down to 1.5. Low-dose heparin started. No further VT on monitor.  General:  NAD.  HEENT: normal  Neck: supple. JVP not elevated.  Carotids 2+ bilat; no bruits. No lymphadenopathy or thryomegaly appreciated. Cor: LVAD hum.  Lungs: Clear. Abdomen soft, mildly tender in epigastrum , non-distended. No hepatosplenomegaly. No bruits or masses. Good bowel sounds. Driveline site clean. Anchor in place.  Extremities: no cyanosis, clubbing, rash. Warm no edema  Neuro: alert & oriented x 3. No focal deficits. Moves all 4 without problem   Ab symptoms improved. CT scan a/p no acute issues. MAPs and VAD parameters also improved. Plan multi-level vertebroplasty tomorrow. Will continue low-dose heparin. Discussed dosing with PharmD personally.  Glori Bickers, MD  9:00 AM

## 2019-12-29 ENCOUNTER — Inpatient Hospital Stay (HOSPITAL_COMMUNITY): Payer: Medicare Other | Admitting: Certified Registered"

## 2019-12-29 ENCOUNTER — Inpatient Hospital Stay (HOSPITAL_COMMUNITY): Payer: Medicare Other

## 2019-12-29 ENCOUNTER — Encounter (HOSPITAL_COMMUNITY)
Admission: EM | Disposition: A | Discharge status: Home Health | Payer: Self-pay | Source: Home / Self Care | Attending: Internal Medicine

## 2019-12-29 ENCOUNTER — Encounter (HOSPITAL_COMMUNITY): Payer: Self-pay | Admitting: Internal Medicine

## 2019-12-29 DIAGNOSIS — I5022 Chronic systolic (congestive) heart failure: Secondary | ICD-10-CM | POA: Diagnosis not present

## 2019-12-29 DIAGNOSIS — Z95811 Presence of heart assist device: Secondary | ICD-10-CM | POA: Diagnosis not present

## 2019-12-29 DIAGNOSIS — S32000A Wedge compression fracture of unspecified lumbar vertebra, initial encounter for closed fracture: Secondary | ICD-10-CM | POA: Diagnosis not present

## 2019-12-29 DIAGNOSIS — R112 Nausea with vomiting, unspecified: Secondary | ICD-10-CM | POA: Diagnosis not present

## 2019-12-29 HISTORY — PX: RADIOLOGY WITH ANESTHESIA: SHX6223

## 2019-12-29 HISTORY — PX: IR VERTEBROPLASTY LUMBAR BX INC UNI/BIL INC/INJECT/IMAGING: IMG5516

## 2019-12-29 HISTORY — PX: IR VERTEBROPLASTY EA ADDL (T&LS) BX INC UNI/BIL INC INJECT/IMAGING: IMG5517

## 2019-12-29 LAB — BASIC METABOLIC PANEL
Anion gap: 8 (ref 5–15)
BUN: 5 mg/dL — ABNORMAL LOW (ref 8–23)
CO2: 24 mmol/L (ref 22–32)
Calcium: 8.7 mg/dL — ABNORMAL LOW (ref 8.9–10.3)
Chloride: 108 mmol/L (ref 98–111)
Creatinine, Ser: 0.83 mg/dL (ref 0.44–1.00)
GFR calc Af Amer: >60 mL/min (ref >60)
GFR calc non Af Amer: >60 mL/min (ref >60)
Glucose, Bld: 91 mg/dL (ref 70–99)
Potassium: 3.7 mmol/L (ref 3.5–5.1)
Sodium: 140 mmol/L (ref 135–145)

## 2019-12-29 LAB — CBC
HCT: 34.3 % — ABNORMAL LOW (ref 36.0–46.0)
Hemoglobin: 10.6 g/dL — ABNORMAL LOW (ref 12.0–15.0)
MCH: 30.9 pg (ref 26.0–34.0)
MCHC: 30.9 g/dL (ref 30.0–36.0)
MCV: 100 fL (ref 80.0–100.0)
Platelets: 212 10*3/uL (ref 150–400)
RBC: 3.43 MIL/uL — ABNORMAL LOW (ref 3.87–5.11)
RDW: 14.6 % (ref 11.5–15.5)
WBC: 7.5 10*3/uL (ref 4.0–10.5)
nRBC: 0 % (ref 0.0–0.2)

## 2019-12-29 LAB — HEPARIN LEVEL (UNFRACTIONATED): Heparin Unfractionated: <0.1 IU/mL — ABNORMAL LOW (ref 0.30–0.70)

## 2019-12-29 LAB — LACTATE DEHYDROGENASE: LDH: 189 U/L (ref 98–192)

## 2019-12-29 LAB — PREPARE RBC (CROSSMATCH)

## 2019-12-29 LAB — PROTIME-INR
INR: 1.5 — ABNORMAL HIGH (ref 0.8–1.2)
Prothrombin Time: 17.4 seconds — ABNORMAL HIGH (ref 11.4–15.2)

## 2019-12-29 LAB — MAGNESIUM: Magnesium: 2 mg/dL (ref 1.7–2.4)

## 2019-12-29 SURGERY — IR WITH ANESTHESIA
Anesthesia: General

## 2019-12-29 MED ORDER — ACETAMINOPHEN 500 MG PO TABS: 1000.0000 mg | ORAL_TABLET | Freq: Once | ORAL | Status: DC | PRN

## 2019-12-29 MED ORDER — HEPARIN (PORCINE) 25000 UT/250ML-% IV SOLN
800.0000 [IU]/h | INTRAVENOUS | Status: DC
  Administered 2019-12-29 – 2019-12-30 (×2): 500 [IU]/h via INTRAVENOUS
  Administered 2020-01-01: 700 [IU]/h via INTRAVENOUS
  Administered 2020-01-02 – 2020-01-07 (×4): 800 [IU]/h via INTRAVENOUS
  Filled 2019-12-29 (×6): qty 250

## 2019-12-29 MED ORDER — FENTANYL CITRATE (PF) 100 MCG/2ML IJ SOLN
INTRAMUSCULAR | Status: DC | PRN
  Administered 2019-12-29: 50 ug via INTRAVENOUS

## 2019-12-29 MED ORDER — LIDOCAINE 2% (20 MG/ML) 5 ML SYRINGE
INTRAMUSCULAR | Status: DC | PRN
  Administered 2019-12-29: 60 mg via INTRAVENOUS

## 2019-12-29 MED ORDER — SODIUM CHLORIDE 0.9 % IV SOLN: INTRAVENOUS | Status: AC

## 2019-12-29 MED ORDER — CEFAZOLIN SODIUM-DEXTROSE 2-3 GM-%(50ML) IV SOLR
INTRAVENOUS | Status: DC | PRN
  Administered 2019-12-29: 2 g via INTRAVENOUS

## 2019-12-29 MED ORDER — ACETAMINOPHEN 160 MG/5ML PO SOLN: 1000.0000 mg | Freq: Once | ORAL | Status: DC | PRN

## 2019-12-29 MED ORDER — CEFAZOLIN SODIUM-DEXTROSE 2-4 GM/100ML-% IV SOLN
INTRAVENOUS | Status: AC
  Filled 2019-12-29: qty 100

## 2019-12-29 MED ORDER — BUPIVACAINE HCL (PF) 0.5 % IJ SOLN
INTRAMUSCULAR | Status: AC
  Filled 2019-12-29: qty 30

## 2019-12-29 MED ORDER — ACETAMINOPHEN 10 MG/ML IV SOLN: 1000.0000 mg | Freq: Once | INTRAVENOUS | Status: DC | PRN

## 2019-12-29 MED ORDER — TOBRAMYCIN SULFATE 1.2 G IJ SOLR
INTRAMUSCULAR | Status: AC
  Administered 2019-12-29: 0.1 mg
  Filled 2019-12-29: qty 1.2

## 2019-12-29 MED ORDER — PROPOFOL 10 MG/ML IV BOLUS
INTRAVENOUS | Status: DC | PRN
  Administered 2019-12-29 (×2): 20 mg via INTRAVENOUS
  Administered 2019-12-29: 60 mg via INTRAVENOUS
  Administered 2019-12-29: 20 mg via INTRAVENOUS

## 2019-12-29 MED ORDER — GELATIN ABSORBABLE 12-7 MM EX MISC
CUTANEOUS | Status: AC
  Filled 2019-12-29: qty 1

## 2019-12-29 MED ORDER — FENTANYL CITRATE (PF) 100 MCG/2ML IJ SOLN
INTRAMUSCULAR | Status: AC
  Administered 2019-12-29: 25 ug via INTRAVENOUS
  Filled 2019-12-29: qty 2

## 2019-12-29 MED ORDER — IOHEXOL 300 MG/ML  SOLN
50.0000 mL | Freq: Once | INTRAMUSCULAR | Status: AC | PRN
  Administered 2019-12-29: 15:00:00 15 mL via ORAL

## 2019-12-29 MED ORDER — BUPIVACAINE HCL 0.25 % IJ SOLN
INTRAMUSCULAR | Status: DC | PRN
  Administered 2019-12-29: 15 mL

## 2019-12-29 MED ORDER — PHENYLEPHRINE 40 MCG/ML (10ML) SYRINGE FOR IV PUSH (FOR BLOOD PRESSURE SUPPORT)
PREFILLED_SYRINGE | INTRAVENOUS | Status: DC | PRN
  Administered 2019-12-29 (×3): 40 ug via INTRAVENOUS

## 2019-12-29 MED ORDER — DEXAMETHASONE SODIUM PHOSPHATE 10 MG/ML IJ SOLN
INTRAMUSCULAR | Status: DC | PRN
  Administered 2019-12-29: 4 mg via INTRAVENOUS

## 2019-12-29 MED ORDER — FENTANYL CITRATE (PF) 100 MCG/2ML IJ SOLN
25.0000 ug | INTRAMUSCULAR | Status: DC | PRN
  Administered 2019-12-29: 16:00:00 25 ug via INTRAVENOUS

## 2019-12-29 MED ORDER — ROCURONIUM BROMIDE 10 MG/ML (PF) SYRINGE
PREFILLED_SYRINGE | INTRAVENOUS | Status: DC | PRN
  Administered 2019-12-29: 60 mg via INTRAVENOUS
  Administered 2019-12-29: 20 mg via INTRAVENOUS

## 2019-12-29 MED ORDER — POTASSIUM CHLORIDE CRYS ER 20 MEQ PO TBCR
30.0000 meq | EXTENDED_RELEASE_TABLET | Freq: Once | ORAL | Status: AC
  Administered 2019-12-30: 30 meq via ORAL
  Filled 2019-12-29: qty 1

## 2019-12-29 MED ORDER — PHENYLEPHRINE HCL-NACL 10-0.9 MG/250ML-% IV SOLN
INTRAVENOUS | Status: DC | PRN
  Administered 2019-12-29: 25 ug/min via INTRAVENOUS

## 2019-12-29 MED ORDER — ONDANSETRON HCL 4 MG/2ML IJ SOLN
INTRAMUSCULAR | Status: DC | PRN
  Administered 2019-12-29: 4 mg via INTRAVENOUS

## 2019-12-29 MED ORDER — LACTATED RINGERS IV SOLN: INTRAVENOUS | Status: DC | PRN

## 2019-12-29 MED ORDER — SODIUM CHLORIDE 0.9% IV SOLUTION: Freq: Once | INTRAVENOUS | Status: DC

## 2019-12-29 MED ORDER — SUGAMMADEX SODIUM 200 MG/2ML IV SOLN
INTRAVENOUS | Status: DC | PRN
  Administered 2019-12-29: 100 mg via INTRAVENOUS
  Administered 2019-12-29: 200 mg via INTRAVENOUS

## 2019-12-29 MED ORDER — ALBUMIN HUMAN 5 % IV SOLN
INTRAVENOUS | Status: DC | PRN
Start: 2019-12-29 — End: 2019-12-29

## 2019-12-29 NOTE — Progress Notes (Signed)
[place] for heparin Indication: hx LVAD  Allergies  Allergen Reactions  . Oxycodone Other (See Comments)    Hallucinations     Patient Measurements: Height: 5' (152.4 cm) Weight: 70.4 kg (155 lb 3.3 oz) IBW/kg (Calculated) : 45.5 Heparin Dosing Weight: 61.6 kg   Vital Signs: Temp: 98.3 F (36.8 C) (04/28 0347) Temp Source: Oral (04/28 0347) BP: 102/83 (04/28 0347) Pulse Rate: 74 (04/28 0347)  Labs: Recent Labs    12/27/19 0043 12/27/19 0043 12/28/19 0238 12/28/19 1814 12/29/19 0226  HGB 10.7*   < > 11.2*  --  10.6*  HCT 33.7*  --  35.6*  --  34.3*  PLT 209  --  207  --  212  LABPROT 22.7*  --  18.1*  --  17.4*  INR 2.0*  --  1.5*  --  1.5*  HEPARINUNFRC  --   --   --  <0.10* <0.10*  CREATININE 0.83  --  0.89  --  0.83   < > = values in this interval not displayed.    Estimated Creatinine Clearance: 45 mL/min (by C-G formula based on SCr of 0.83 mg/dL).   Medical History: Past Medical History:  Diagnosis Date  . AICD (automatic cardioverter/defibrillator) present   . Anemia   . Atrial fibrillation or flutter   . Cardiac arrest - ventricular fibrillation 12/10   with successful resucitation, S/p ICD  . CHF (congestive heart failure) ([place])   . Diverticula of colon 2011  . HTN (hypertension)    moderate  . ICD (implantable cardiac defibrillator) in place    she has received appropriate therapy for VF  . Internal and external hemorrhoids without complication [id number]  . Nonischemic cardiomyopathy ([place])    followed by Dr April Holding at [facility]  . Osteopenia   . Plasma cell disorder 03/20/2012  . Plasma cell disorder 03/20/2012  . Sessile colonic polyp 2011   Dr Benson [place]    Medications:  Scheduled:  . amiodarone  200 mg Oral Daily  . calcitonin (salmon)  1 spray Alternating Nares Daily  . carvedilol  6.25 mg Oral BID WC  . docusate sodium  100 mg Oral Daily  . feeding supplement (ENSURE ENLIVE)  237 mL Oral BID BM  .  gabapentin  300 mg Oral BID  . hydrALAZINE  100 mg Oral TID  . losartan  100 mg Oral Daily  . pantoprazole sodium  20 mg Oral Daily    Assessment: 46 yof with HM2 LVAD, presenting with nausea/vomiting due to multilevel lumbar compression fracture - awaiting vertebroplasty scheduled for 4/28.  Warfarin held and low-dose heparin started. Per discussion with MD, will check heparin level to ensure level not high but will not adjust if low. INR today remains 1.5. Hgb 10.6, plt 212. LDH 189.  Heparin level remains undetectable. Will not adjust per previous pharmacist discussion with MD. No current active bleed issues reported.   Goal of Therapy:  Heparin level 0.3-0.5 units/ml INR 1.8-2.3 Monitor platelets by anticoagulation protocol: Yes   Plan:  Continue low-dose heparin infusion at 500 units/hr Stop infusion at 0930 today (1 hour prior to procedure) per IR  Brianna Hanson, PharmD, [facility] PGY2 Cardiology Pharmacy Resident Phone [phone] 12/29/2019       7:23 AM  Please check [email or web address] for unit-specific pharmacist phone numbers

## 2019-12-29 NOTE — Progress Notes (Signed)
OT Cancellation Note  Patient Details Name: Brianna Hanson MRN: [id number] DOB: [birth year]   Cancelled Treatment:    Reason Eval/Treat Not Completed: Patient at procedure or test/ unavailable (for kyphoplasty today). Will follow up for OT treatment as able.  Brianna Hanson, OT Acute Rehabilitation Services Pager [phone] Office [phone]   Brianna Hanson 12/29/2019, 1:56 PM

## 2019-12-29 NOTE — Procedures (Addendum)
S/P T10,T12 ,L3 and L4  Vertebroplasties under GA S.Shiraz Bastyr MD. Post procedure. Patient extubated. Moving all 4s equally.Follows simple commands Appropriately. S.Rubie Ficco MD

## 2019-12-29 NOTE — Transfer of Care (Signed)
Immediate Anesthesia Transfer of Care Note  Patient: KYLAYA VARIO  Procedure(s) Performed: KYPHOPLASTY (N/A )  Patient Location: PACU  Anesthesia Type:General  Level of Consciousness: awake, alert , oriented and patient cooperative  Airway & Oxygen Therapy: Patient Spontanous Breathing and Patient connected to nasal cannula oxygen  Post-op Assessment: Report given to RN and Post -op Vital signs reviewed and stable  Post vital signs: Reviewed and stable  Last Vitals:  Vitals Value Taken Time  BP 126/95 12/29/19 1605  Temp    Pulse 76 12/29/19 1607  Resp 12 12/29/19 1607  SpO2 98 % 12/29/19 1607  Vitals shown include unvalidated device data.  Last Pain:  Vitals:   12/29/19 1101  TempSrc: Oral  PainSc:       Patients Stated Pain Goal: 1 ([id number])  Complications: No apparent anesthesia complications

## 2019-12-29 NOTE — Anesthesia Procedure Notes (Signed)
Procedure Name: Intubation Date/Time: 12/29/2019 2:04 PM Performed by: Oletta Lamas, CRNA Pre-anesthesia Checklist: Patient identified, Emergency Drugs available, Suction available and Patient being monitored Patient Re-evaluated:Patient Re-evaluated prior to induction Oxygen Delivery Method: Circle system utilized Preoxygenation: Pre-oxygenation with 100% oxygen Induction Type: IV induction Ventilation: Mask ventilation without difficulty Laryngoscope Size: Mac and 3 Grade View: Grade I Tube type: Oral Tube size: 7.0 mm Number of attempts: 1 Airway Equipment and Method: Stylet Placement Confirmation: ETT inserted through vocal cords under direct vision,  positive ETCO2 and breath sounds checked- equal and bilateral Secured at: 21 cm Tube secured with: Tape Dental Injury: Teeth and Oropharynx as per pre-operative assessment

## 2019-12-29 NOTE — Plan of Care (Signed)

## 2019-12-29 NOTE — Anesthesia Preprocedure Evaluation (Signed)
Anesthesia Evaluation  Patient identified by MRN, date of birth, ID band Patient awake    Reviewed: Allergy & Precautions, H&P , NPO status , Patient's Chart, lab work & pertinent test results, reviewed documented beta blocker date and time   History of Anesthesia Complications Negative for: history of anesthetic complications  Airway Mallampati: II  TM Distance: >3 FB Neck ROM: Full    Dental  (+) Upper Dentures, Lower Dentures, Dental Advisory Given   Pulmonary neg pulmonary ROS, neg recent URI,    breath sounds clear to auscultation       Cardiovascular hypertension, Pt. on medications and Pt. on home beta blockers +CHF  + Cardiac Defibrillator  Rhythm:Irregular Rate:Normal     Neuro/Psych negative neurological ROS  negative psych ROS   GI/Hepatic negative GI ROS, Neg liver ROS,   Endo/Other  negative endocrine ROS  Renal/GU Renal disease     Musculoskeletal  (+) Arthritis , Osteoarthritis,    Abdominal   Peds  Hematology  (+) Blood dyscrasia, anemia ,   Anesthesia Other Findings   Reproductive/Obstetrics                             Anesthesia Physical Anesthesia Plan  ASA: IV  Anesthesia Plan: General   Post-op Pain Management:    Induction: Intravenous  PONV Risk Score and Plan: 3 and Ondansetron and Dexamethasone  Airway Management Planned: Oral ETT  Additional Equipment: Arterial line and TEE  Intra-op Plan:   Post-operative Plan: Extubation in OR  Informed Consent: I have reviewed the patients History and Physical, chart, labs and discussed the procedure including the risks, benefits and alternatives for the proposed anesthesia with the patient or authorized representative who has indicated his/her understanding and acceptance.     Dental advisory given  Plan Discussed with: CRNA and Surgeon  Anesthesia Plan Comments:         Anesthesia Quick  Evaluation

## 2019-12-29 NOTE — Progress Notes (Signed)
PT Cancellation Note  Patient Details Name: Brianna Hanson MRN: [id number] DOB: 14-Apr-1935   Cancelled Treatment:    Reason Eval/Treat Not Completed: Patient at procedure or test/unavailable (to OR for kyphoplasty). PT will continue to f/u with pt acutely as available and appropriate.    Clearnce Sorrel Mushka Laconte 12/29/2019, 1:30 PM

## 2019-12-29 NOTE — Progress Notes (Signed)
VAD Coordinator Procedure Note:   VAD Coordinator met patient in holding room 36. Pt undergoing vertebroplasty per Dr. Estanislado Pandy.  Hemodynamics and VAD parameters monitored by myself and anesthesia throughout the procedure. Blood pressures were obtained with automatic cuff on left arm arm and correlated with Doppler MAP. Right radial A-line placed by anesthesia in holding room.   Accompanied patient to Interventional Radiology room 2. Dr. Ermalene Postin in room to assist with TEE probe placement after intubation. Patient placed prone on IR table for this procedure. RV will be monitored by TEE throughout case.     Time: Doppler Auto  BP Flow PI Power Speed  Pre-procedure:           13:00 85 116/76 (87) 3.7 7.0 4.3 8600     A-line       14:00  110/70 (85) 3.7 7.0 4.3   Secation Induction:          14:15  108/86 (95) 3.2 6.3 4.0    14:30  120/90 (102) 3.3 6.7 4.3    14:45  99/77 (85) 2.9 6.2 3.8    15:00  113/82 (91) 2.8 6.4 3.8    15:15  119/87 (98) 2.8 6.3 3.8    15:30  147/91 (112) 3.6 6.7 4.1    15:45  132/79 (101)  4.1 6.9 4.3  Recovery Area:          16:00  120/77 (90) 3.9 6.8 4.3    16:15  146/85 (109) 4.1 6.7 4.1    16:30  140/81 (105) 4.2 6.7 4.4    16:40  127/83 (97) 4.2 6.6 4.4              Patient tolerated the procedure well. VAD Coordinator accompanied and remained with patient in recovery area.    Patient Disposition: [place] RN, [place] Coordinator [phone]

## 2019-12-29 NOTE — Anesthesia Procedure Notes (Signed)
Arterial Line Insertion Start/End4/28/2021 1:00 PM, 12/29/2019 1:08 PM Performed by: Colin Benton, CRNA, CRNA  Patient location: Pre-op. Preanesthetic checklist: patient identified, IV checked, site marked, risks and benefits discussed, surgical consent, monitors and equipment checked, pre-op evaluation, timeout performed and anesthesia consent Lidocaine 1% used for infiltration Right, radial was placed Catheter size: 20 G Hand hygiene performed  and maximum sterile barriers used  Allen's test indicative of satisfactory collateral circulation Attempts: 1 Procedure performed without using ultrasound guided technique. Following insertion, dressing applied and Biopatch. Post procedure assessment: normal and unchanged  Patient tolerated the procedure well with no immediate complications.

## 2019-12-29 NOTE — Progress Notes (Signed)
LVAD Coordinator Rounding Note:  Admitted to Dr. Clayborne Dana service on 12/22/19 due to abdominal pain with refractory nausea and vomiting due to lumbar compression fracture. Brianna Hanson   HM II LVAD implanted on 04/06/13 by Dr. Cyndia Bent under Destination Therapy criteria due to advanced age.   Pt lying in bed in holding area prior to IR procedure. She is smiling, says her pain is "much better".  Vital signs: Temp:  97.5 HR: 73 Doppler Pressure: 85 Automatic BP: 116/76 (87) O2 Sat: 100% RA Wt: 164.9>162.7>153.4>165.1>159.8>155.2 lbs   LVAD interrogation reveals:  Speed:  8600 Flow:  4.0 Power:  4.3w PI: 7.1 Alarms: 1 Low Flow on 4/27 Events: 8 PI events on 4/27  Fixed speed: 8600 Low speed limit: 8000  Drive Line:  C/D/I with anchor intact and accurately applied. Weekly dressing changes per bedside nurse or patient's husband. Next dressing change due tomorrow, 12/30/19 per patient.  Labs:  LDH trend: 203>219>202>179>177>189  INR trend: 3.6>3.1>2.8>2.0>1.5>1.5  Anticoagulation Plan: -INR Goal: 2.0 - 2.5 - holding warfarin  -ASA Dose: none (intolerance)  Device:  St Jude dual ICD - Pacing: DDD 60 -Therapies: on 200   Plan/Recommendations:  1. Call VAD Coordinator for any VAD equipment or drive line issues. 2. Weekly LVAD drive line dressing changes per bedside nurse or patient's husband.  Zada Girt RN [place] Coordinator  Office: [phone]  24/7 Pager: [phone]

## 2019-12-29 NOTE — Progress Notes (Addendum)
Advanced Heart Failure VAD Team Note  PCP-Cardiologist: No primary care provider on file.   Subjective:   85 y/o female w/ NICM, s/p ICD and LVAD for DT(04/2013), Admitted with N/V, abd pain due to multilevel lumbar compression fractures. Awaiting multi-level vertebroplasty.   INR 1.5 today. Surgery posted for tomorrow.   Received clonidine 4/25  for MAPs of 110 and developed multiple low flow alarms. Received 3 bolus on NS. GIven 12 hours of IV fluids.   Feeling a little better today. Mild abdominal pain.    LVAD INTERROGATION:  HeartMate II LVAD:   Flow 3.3  liters/min, speed 8600, power 4.0  PI 6.8  No low flows.    Objective:    Vital Signs:   Temp:  [97.9 F (36.6 C)-98.7 F (37.1 C)] 98 F (36.7 C) (04/28 0722) Pulse Rate:  [73-80] 73 (04/28 0722) Resp:  [13-19] 13 (04/28 0722) BP: (97-121)/(67-83) 121/80 (04/28 0722) SpO2:  [94 %-97 %] 96 % (04/28 0722) Weight:  [70.4 kg] 70.4 kg (04/28 0347) Last BM Date: 12/26/19 Mean arterial Pressure 90s    Intake/Output:   Intake/Output Summary (Last 24 hours) at 12/29/2019 0834 Last data filed at 12/28/2019 1500 Gross per 24 hour  Intake 144.77 ml  Output --  Net 144.77 ml     Physical Exam   Physical Exam: GENERAL: No acute distress. HEENT: normal  NECK: Supple, JVP flat  .  2+ bilaterally, no bruits.  No lymphadenopathy or thyromegaly appreciated.   CARDIAC:  Mechanical heart sounds with LVAD hum present.  LUNGS:  Clear to auscultation bilaterally.  ABDOMEN:  Soft, round, nontender, positive bowel sounds x4.     LVAD exit site: well-healed and incorporated.  Dressing dry and intact.  No erythema or drainage.  Stabilization device present and accurately applied.  Driveline dressing is being changed daily per sterile technique. EXTREMITIES:  Warm and dry, no cyanosis, clubbing, rash or edema  NEUROLOGIC:  Alert and oriented x 3  No aphasia.  No dysarthria.  Affect pleasant.     .   Telemetry   NSR 70s   EKG     No new EKG to review   Labs   Basic Metabolic Panel: Recent Labs  Lab 12/23/19 2317 12/24/19 0259 12/25/19 0236 12/25/19 0236 12/26/19 0255 12/26/19 0255 12/27/19 0043 12/28/19 0238 12/29/19 0226  NA  --    < > 137  --  134*  --  135 134* 140  K  --    < > 4.1  --  3.6  --  3.9 3.0* 3.7  CL  --    < > 102  --  102  --  104 103 108  CO2  --    < > 24  --  26  --  22 21* 24  GLUCOSE  --    < > 95  --  94  --  115* 97 91  BUN  --    < > 7*  --  11  --  <5* <5* 5*  CREATININE  --    < > 0.82  --  0.92  --  0.83 0.89 0.83  CALCIUM  --    < > 9.1   < > 8.6*   < > 8.5* 8.4* 8.7*  MG 1.9  --   --   --   --   --  1.5* 2.3 2.0   < > = values in this interval not displayed.    Liver Function  Tests: Recent Labs  Lab 12/22/19 1106  AST 25  ALT 19  ALKPHOS 120  BILITOT 0.3  PROT 9.2*  ALBUMIN 3.0*   Recent Labs  Lab 12/23/19 0314  LIPASE 19  AMYLASE 76   No results for input(s): AMMONIA in the last 168 hours.  CBC: Recent Labs  Lab 12/25/19 2310 12/26/19 0255 12/27/19 0043 12/28/19 0238 12/29/19 0226  WBC 6.7 6.1 6.2 6.5 7.5  HGB 10.3* 11.5* 10.7* 11.2* 10.6*  HCT 33.0* 37.0 33.7* 35.6* 34.3*  MCV 99.7 100.3* 97.4 97.3 100.0  PLT 210 218 209 207 212    INR: Recent Labs  Lab 12/25/19 0236 12/26/19 0255 12/27/19 0043 12/28/19 0238 12/29/19 0226  INR 2.4* 2.5* 2.0* 1.5* 1.5*    Other results:      Imaging   No results found.   Medications:     Scheduled Medications: . amiodarone  200 mg Oral Daily  . calcitonin (salmon)  1 spray Alternating Nares Daily  . carvedilol  6.25 mg Oral BID WC  . docusate sodium  100 mg Oral Daily  . feeding supplement (ENSURE ENLIVE)  237 mL Oral BID BM  . gabapentin  300 mg Oral BID  . hydrALAZINE  100 mg Oral TID  . losartan  100 mg Oral Daily  . pantoprazole sodium  20 mg Oral Daily    Infusions: . heparin Stopped (12/29/19 0655)    PRN Medications: acetaminophen, fentaNYL (SUBLIMAZE) injection,  ondansetron (ZOFRAN) IV, promethazine, traMADol    Assessment/Plan:    1) Ab pain in setting of intractable n/v due to her back pain and pain meds - CT abdomen/pelvis negative.  - Pain improved.     2) Low back and left hip pain in setting of L1 compression fracture and new L3 fracture - Following with Dr. Lorin Mercy. Options limited - CT and bone scan with multilevel compression fx at T10, T12, L1, L3, and L4. - IR consulted and following for vertebroplasty with Dr. Estanislado Pandy. INR now <2.0.  - Plan for procedure later this morning.   - Continue pain control with tramadol. Have added prn fentanyl for breakthrough - PT/OT following  3) Anorexia - Able to eat last nigth.  - CT A/P unremarkable - Continue Boost supplements as tolerated - Continue PRN Zofran for N/V  4) Chronic systolic HF: NICM, s/p ICD and LVAD for DT(04/2013).  - now 6+ years out on VAD support -  Typically NYHA II but now IIIb in setting of ortho issues - Volume status stable.   5) LVAD placed for DT 04/2013:  - Failed Entresto due to diuresis and incessant low flow alarms.  - VAD interrogated personally. No recurrent low flow alarms  - No ASA with GI bleed and intolerance. - LDH 189  - INR 1.5 Goal 1.8-2.3. - Holding warfarin for vertebroplasty.  - Stop heparin at 0930   6) PAF:  - Did not tolerate AF well at all.  - S/p DC-CV 06/18/19 - In NSR on tele - Continue amio 200 daily.  - Management of warfarin as above - no change  7) HTN:  - Maps elevated this admission, possibly due to pain, but improved today in the 80s -Continue current regimen.   8) VT - possibly due to suction event  - no further VT after IVFs - keep K> 4.0 Mg > 2.0 - Mag 2.0. K 3.7   Appreciate IR and anesthesia teams coordinated care.      Length of Stay: 7  Darrick Grinder, NP 12/29/2019, 8:34 AM  VAD Team --- VAD ISSUES ONLY--- Pager [phone] (7am - 7am)  Advanced Heart Failure Team  Pager [phone] (M-F; 7a - 4p)    Please contact [facility] for night-coverage after hours (4p -7a ) and weekends on [email or web address]   Patient seen and examined with the above-signed Advanced Practice Provider and/or Housestaff. I personally reviewed laboratory data, imaging studies and relevant notes. I independently examined the patient and formulated the important aspects of the plan. I have edited the note to reflect any of my changes or salient points. I have personally discussed the plan with the patient and/or family.  Ab pain resolved. Appetite much better. Back pain improving as well. MAPs stable. No more low flows on VAD. INR 1.5. Now on low dose heparin. No bleeding  General:  NAD.  HEENT: normal  Neck: supple. JVP not elevated.  Carotids 2+ bilat; no bruits. No lymphadenopathy or thryomegaly appreciated. Cor: LVAD hum.  Lungs: Clear. Abdomen: obese soft, nontender, non-distended. No hepatosplenomegaly. No bruits or masses. Good bowel sounds. Driveline site clean. Anchor in place.  Extremities: no cyanosis, clubbing, rash. Warm no edema  Neuro: alert & oriented x 3. No focal deficits. Moves all 4 without problem   Overall improved. For multi-level verterboplasty today in IR. I discussed surgical support issues with anesthesia this am. Agree with arterial line and management with vasopressors and fluid resuscitation as needed. Also d/w TEE team.   Off coumadin. On heparin. Hold for OR. Discussed dosing with PharmD personally.  Glori Bickers, MD  10:27 AM

## 2019-12-30 DIAGNOSIS — E440 Moderate protein-calorie malnutrition: Secondary | ICD-10-CM | POA: Diagnosis not present

## 2019-12-30 DIAGNOSIS — Z95811 Presence of heart assist device: Secondary | ICD-10-CM | POA: Diagnosis not present

## 2019-12-30 DIAGNOSIS — S32000A Wedge compression fracture of unspecified lumbar vertebra, initial encounter for closed fracture: Secondary | ICD-10-CM | POA: Diagnosis not present

## 2019-12-30 DIAGNOSIS — I5022 Chronic systolic (congestive) heart failure: Secondary | ICD-10-CM | POA: Diagnosis not present

## 2019-12-30 DIAGNOSIS — Z7901 Long term (current) use of anticoagulants: Secondary | ICD-10-CM | POA: Diagnosis not present

## 2019-12-30 LAB — CBC
HCT: 32.4 % — ABNORMAL LOW (ref 36.0–46.0)
Hemoglobin: 10.1 g/dL — ABNORMAL LOW (ref 12.0–15.0)
MCH: 31 pg (ref 26.0–34.0)
MCHC: 31.2 g/dL (ref 30.0–36.0)
MCV: 99.4 fL (ref 80.0–100.0)
Platelets: 211 10*3/uL (ref 150–400)
RBC: 3.26 MIL/uL — ABNORMAL LOW (ref 3.87–5.11)
RDW: 14.6 % (ref 11.5–15.5)
WBC: 8.4 10*3/uL (ref 4.0–10.5)
nRBC: 0 % (ref 0.0–0.2)

## 2019-12-30 LAB — BASIC METABOLIC PANEL
Anion gap: 9 (ref 5–15)
BUN: 9 mg/dL (ref 8–23)
CO2: 23 mmol/L (ref 22–32)
Calcium: 9.1 mg/dL (ref 8.9–10.3)
Chloride: 109 mmol/L (ref 98–111)
Creatinine, Ser: 0.91 mg/dL (ref 0.44–1.00)
GFR calc Af Amer: >60 mL/min (ref >60)
GFR calc non Af Amer: 58 mL/min — ABNORMAL LOW (ref >60)
Glucose, Bld: 94 mg/dL (ref 70–99)
Potassium: 3.9 mmol/L (ref 3.5–5.1)
Sodium: 141 mmol/L (ref 135–145)

## 2019-12-30 LAB — LACTATE DEHYDROGENASE: LDH: 249 U/L — ABNORMAL HIGH (ref 98–192)

## 2019-12-30 LAB — MAGNESIUM: Magnesium: 1.8 mg/dL (ref 1.7–2.4)

## 2019-12-30 LAB — PROTIME-INR
INR: 1.4 — ABNORMAL HIGH (ref 0.8–1.2)
Prothrombin Time: 16.2 seconds — ABNORMAL HIGH (ref 11.4–15.2)

## 2019-12-30 LAB — HEPARIN LEVEL (UNFRACTIONATED): Heparin Unfractionated: <0.1 IU/mL — ABNORMAL LOW (ref 0.30–0.70)

## 2019-12-30 MED ORDER — BOOST / RESOURCE BREEZE PO LIQD CUSTOM
1.0000 | Freq: Three times a day (TID) | ORAL | Status: DC
  Administered 2019-12-30 – 2020-01-04 (×15): 1 via ORAL

## 2019-12-30 NOTE — Plan of Care (Signed)
  Problem: Clinical Measurements: Goal: Ability to maintain clinical measurements within normal limits will improve Outcome: Progressing Goal: Will remain free from infection Outcome: Progressing Goal: Diagnostic test results will improve Outcome: Progressing Goal: Respiratory complications will improve Outcome: Progressing Goal: Cardiovascular complication will be avoided Outcome: Progressing   Problem: Activity: Goal: Risk for activity intolerance will decrease Outcome: Progressing   Problem: Nutrition: Goal: Adequate nutrition will be maintained Outcome: Progressing   Problem: Coping: Goal: Level of anxiety will decrease Outcome: Progressing   Problem: Elimination: Goal: Will not experience complications related to bowel motility Outcome: Progressing Goal: Will not experience complications related to urinary retention Outcome: Progressing   Problem: Pain Managment: Goal: General experience of comfort will improve Outcome: Progressing   Problem: Safety: Goal: Ability to remain free from injury will improve Outcome: Progressing   Problem: Skin Integrity: Goal: Risk for impaired skin integrity will decrease Outcome: Progressing   Problem: Education: Goal: Patient will be able to verbalize current INR target range and antiplatelet therapy for discharge home Outcome: Progressing   Problem: Education: Goal: Knowledge of General Education information will improve Description: Including pain rating scale, medication(s)/side effects and non-pharmacologic comfort measures Outcome: Completed/Met   Problem: Health Behavior/Discharge Planning: Goal: Ability to manage health-related needs will improve Outcome: Completed/Met   Problem: Education: Goal: Patient will understand all VAD equipment and how it functions Outcome: Completed/Met   Problem: Cardiac: Goal: LVAD will function as expected and patient will experience no clinical alarms Outcome: Completed/Met

## 2019-12-30 NOTE — Progress Notes (Signed)
[place] for heparin Indication: hx LVAD  Allergies  Allergen Reactions  . Oxycodone Other (See Comments)    Hallucinations     Patient Measurements: Height: 5' (152.4 cm) Weight: 75.2 kg (165 lb 12.6 oz) IBW/kg (Calculated) : 45.5 Heparin Dosing Weight: 61.6 kg   Vital Signs: Temp: 98.4 F (36.9 C) (04/29 1048) Temp Source: Oral (04/29 1048) BP: 96/68 (04/29 1048) Pulse Rate: 78 (04/29 1048)  Labs: Recent Labs    12/28/19 0238 12/28/19 0238 12/28/19 1814 12/29/19 0226 12/30/19 0620  HGB 11.2*   < >  --  10.6* 10.1*  HCT 35.6*  --   --  34.3* 32.4*  PLT 207  --   --  212 211  LABPROT 18.1*  --   --  17.4* 16.2*  INR 1.5*  --   --  1.5* 1.4*  HEPARINUNFRC  --   --  <0.10* <0.10* <0.10*  CREATININE 0.89  --   --  0.83 0.91   < > = values in this interval not displayed.    Estimated Creatinine Clearance: 42.4 mL/min (by C-G formula based on SCr of 0.91 mg/dL).   Medical History: Past Medical History:  Diagnosis Date  . AICD (automatic cardioverter/defibrillator) present   . Anemia   . Atrial fibrillation or flutter   . Cardiac arrest - ventricular fibrillation 12/10   with successful resucitation, S/p ICD  . CHF (congestive heart failure) ([place])   . Diverticula of colon 2011  . HTN (hypertension)    moderate  . ICD (implantable cardiac defibrillator) in place    she has received appropriate therapy for VF  . Internal and external hemorrhoids without complication [id number]  . Nonischemic cardiomyopathy ([place])    followed by Dr April Holding at [facility]  . Osteopenia   . Plasma cell disorder 03/20/2012  . Plasma cell disorder 03/20/2012  . Sessile colonic polyp 2011   Dr Benson [place]    Medications:  Scheduled:  . sodium chloride   Intravenous Once  . amiodarone  200 mg Oral Daily  . calcitonin (salmon)  1 spray Alternating Nares Daily  . carvedilol  6.25 mg Oral BID WC  . docusate sodium  100 mg Oral Daily  . feeding supplement  1  Container Oral TID BM  . gabapentin  300 mg Oral BID  . hydrALAZINE  100 mg Oral TID  . losartan  100 mg Oral Daily  . pantoprazole sodium  20 mg Oral Daily    Assessment: 50 yof with HM2 LVAD, presenting with nausea/vomiting due to multilevel lumbar compression fracture - awaiting vertebroplasty scheduled for 4/28.  Warfarin held and low-dose heparin started. Per discussion with MD, will check heparin level to ensure level not high but will not adjust if low. INR today 1.4. Hgb stable 10., plt stable 200s. LDH 189>249 slight bump post op - watch.  Heparin level remains undetectable. Will not adjust per previous pharmacist discussion with MD. No current active bleed issues reported.   Goal of Therapy:  Heparin level 0.3-0.5 units/ml INR 1.8-2.3 Monitor platelets by anticoagulation protocol: Yes   Plan:  Continue low-dose heparin infusion at 500 units/hr Monitor s/s bleeding  Daily HL, Protime, CBC Follow up post op course and when to restart warfarin  Bonnita Nasuti Pharm.D. CPP, BCPS Clinical Pharmacist [phone] 12/30/2019 12:40 PM    Please check [email or web address] for unit-specific pharmacist phone numbers

## 2019-12-30 NOTE — Progress Notes (Signed)
Advanced Heart Failure VAD Team Note  PCP-Cardiologist: No primary care provider on file.   Subjective:   85 y/o female w/ NICM, s/p ICD and LVAD for DT(04/2013), Admitted with N/V, abd pain due to multilevel lumbar compression fractures. Awaiting multi-level vertebroplasty.   S/p multi-level vertebroplasty on 4/28. Back pain improved. Still a bit sore. Appetite ok. On low-dose heparin. No bleeding. Hgb 10.6 -> 10.1  LVAD INTERROGATION:  HeartMate II LVAD:   Flow 3.4  liters/min, speed 8600, power 4.0  PI 7.0  VAD interrogated personally. Parameters stable.    Objective:    Vital Signs:   Temp:  [98 F (36.7 C)-99.2 F (37.3 C)] 98.4 F (36.9 C) (04/29 1048) Pulse Rate:  [75-90] 78 (04/29 1048) Resp:  [14-20] 20 (04/29 1048) BP: (96-127)/(66-95) 96/68 (04/29 1048) SpO2:  [97 %-100 %] 97 % (04/29 1048) Arterial Line BP: (127-138)/(75-85) 128/75 (04/28 1633) Weight:  [75.2 kg] 75.2 kg (04/29 0640) Last BM Date: 12/26/19 Mean arterial Pressure 80-90s    Intake/Output:   Intake/Output Summary (Last 24 hours) at 12/30/2019 1248 Last data filed at 12/30/2019 1200 Gross per 24 hour  Intake 1899.73 ml  Output 1121 ml  Net 778.73 ml     Physical Exam   Physical Exam: General:  NAD.  HEENT: normal  Neck: supple. JVP not elevated.  Carotids 2+ bilat; no bruits. No lymphadenopathy or thryomegaly appreciated. Cor: LVAD hum.  Lungs: Clear. Abdomen: soft, nontender, non-distended. No hepatosplenomegaly. No bruits or masses. Good bowel sounds. Driveline site clean. Anchor in place.  Extremities: no cyanosis, clubbing, rash. Warm no edema  Neuro: alert & oriented x 3. No focal deficits. Moves all 4 without problem    Telemetry   NSR 70-80s Personally reviewed  EKG    No new EKG to review   Labs   Basic Metabolic Panel: Recent Labs  Lab 12/23/19 2317 12/24/19 0259 12/26/19 0255 12/26/19 0255 12/27/19 0043 12/27/19 0043 12/28/19 0238 12/29/19 0226 12/30/19 0620   NA  --    < > 134*  --  135  --  134* 140 141  K  --    < > 3.6  --  3.9  --  3.0* 3.7 3.9  CL  --    < > 102  --  104  --  103 108 109  CO2  --    < > 26  --  22  --  21* 24 23  GLUCOSE  --    < > 94  --  115*  --  97 91 94  BUN  --    < > 11  --  <5*  --  <5* 5* 9  CREATININE  --    < > 0.92  --  0.83  --  0.89 0.83 0.91  CALCIUM  --    < > 8.6*   < > 8.5*   < > 8.4* 8.7* 9.1  MG 1.9  --   --   --  1.5*  --  2.3 2.0 1.8   < > = values in this interval not displayed.    Liver Function Tests: No results for input(s): AST, ALT, ALKPHOS, BILITOT, PROT, ALBUMIN in the last 168 hours. No results for input(s): LIPASE, AMYLASE in the last 168 hours. No results for input(s): AMMONIA in the last 168 hours.  CBC: Recent Labs  Lab 12/26/19 0255 12/27/19 0043 12/28/19 0238 12/29/19 0226 12/30/19 0620  WBC 6.1 6.2 6.5 7.5 8.4  HGB 11.5*  10.7* 11.2* 10.6* 10.1*  HCT 37.0 33.7* 35.6* 34.3* 32.4*  MCV 100.3* 97.4 97.3 100.0 99.4  PLT 218 209 207 212 211    INR: Recent Labs  Lab 12/26/19 0255 12/27/19 0043 12/28/19 0238 12/29/19 0226 12/30/19 0620  INR 2.5* 2.0* 1.5* 1.5* 1.4*    Other results:      Imaging   No results found.   Medications:     Scheduled Medications: . sodium chloride   Intravenous Once  . amiodarone  200 mg Oral Daily  . calcitonin (salmon)  1 spray Alternating Nares Daily  . carvedilol  6.25 mg Oral BID WC  . docusate sodium  100 mg Oral Daily  . feeding supplement  1 Container Oral TID BM  . gabapentin  300 mg Oral BID  . hydrALAZINE  100 mg Oral TID  . losartan  100 mg Oral Daily  . pantoprazole sodium  20 mg Oral Daily    Infusions: . heparin 500 Units/hr (12/29/19 2003)    PRN Medications: acetaminophen, bupivacaine, fentaNYL (SUBLIMAZE) injection, ondansetron (ZOFRAN) IV, promethazine, traMADol    Assessment/Plan:    1) Ab pain in setting of intractable n/v due to her back pain and pain meds - CT abdomen/pelvis negative.  -  Pain now resolved.   2) Low back and left hip pain in setting of L1 compression fracture and new L3 fracture - Following with Dr. Lorin Mercy. Options limited - CT and bone scan with multilevel compression fx at T10, T12, L1, L3, and L4. - Underwent multilevel vertebroplasty with Dr. Estanislado Pandy on 4/28.  - Pain improved - PT/OT following  3) Anorexia - Able to eat last nigth.  - CT A/P unremarkable - Improved  4) Chronic systolic HF: NICM, s/p ICD and LVAD for DT(04/2013).  - now 6+ years out on VAD support -  Typically NYHA II but now IIIb in setting of ortho issues - Volume status improved after IVF. Low flows resolved.   5) LVAD placed for DT 04/2013:  - Failed Entresto due to diuresis and incessant low flow alarms.  - VAD interrogated personally. Parameters stable. - No ASA with GI bleed and intolerance. - LDH 249 - INR 1.4 Goal 1.8-2.3. - on low-dose heparin. No bleeding - start warfarin. Discussed dosing with PharmD personally.  6) PAF:  - Did not tolerate AF well at all.  - S/p DC-CV 06/18/19 - In NSR on tele - Continue amio 200 daily.  - Management of warfarin as above - No change  7) HTN:  - Maps elevated this admission, possibly due to pain, but now improved -Continue current regimen.   8) VT - possibly due to suction event  - no further VT after IVFs - keep K> 4.0 Mg > 2.0 - Mag 2.0. K 3.7    Length of Stay: [place], MD 12/30/2019, 12:48 PM  VAD Team --- VAD ISSUES ONLY--- Pager [phone] ([place])  Advanced Heart Failure Team  Pager [phone] (M-F; 7a - 4p)  Please contact [facility] for night-coverage after hours (4p -7a ) and weekends on [email or web address]

## 2019-12-30 NOTE — Progress Notes (Signed)
Referring Physician(s): [name]  Supervising Physician: Luanne Bras  Patient Status:  [facility] - In-pt  Chief Complaint: Back pain  Subjective: S/p T12, L1, L3, L4 KP yesterday with Dr. Estanislado Pandy.  Resting comfortably in bed.  States she was able to sit up with PT for 15  Minutes.  Reports 50% reduction in pain.   Allergies: Oxycodone  Medications: Prior to Admission medications   Medication Sig Start Date End Date Taking? Authorizing Provider  amiodarone (PACERONE) 200 MG tablet Take 1 tablet (200 mg total) by mouth daily. 05/31/19  Yes [name], Shaune Pascal, MD  carvedilol (COREG) 6.25 MG tablet TAKE 1 TABLET BY MOUTH 2 TIMES DAILY WITH A MEAL Patient taking differently: Take 6.25 mg by mouth 2 (two) times daily with a meal.  08/16/19  Yes [name], Shaune Pascal, MD  Cholecalciferol (VITAMIN D3) 25 MCG (1000 UT) CAPS Take 1,000 Units by mouth daily.    Yes [provider]  docusate sodium (COLACE) 100 MG capsule Take 100 mg by mouth daily.   Yes [provider]  ferrous sulfate 325 (65 FE) MG tablet Take 1 tablet (325 mg total) by mouth 2 (two) times daily with a meal. 07/11/15  Yes [name], Shaune Pascal, MD  furosemide (LASIX) 20 MG tablet TAKE 1 TABLET BY MOUTH EVERY DAY AS NEEDED ONLY WHEN RECOMMENDED Patient taking differently: Take 20 mg by mouth as needed (swelling).  12/07/19  Yes [name], Shaune Pascal, MD  gabapentin (NEURONTIN) 100 MG capsule Take 3 capsules (300 mg total) by mouth 2 (two) times daily. Patient taking differently: Take 100 mg by mouth at bedtime.  10/25/19  Yes [name], Shaune Pascal, MD  hydrALAZINE (APRESOLINE) 100 MG tablet TAKE 1 TABLET BY MOUTH THREE TIMES A DAY Patient taking differently: Take 100 mg by mouth 3 (three) times daily.  11/11/19  Yes [name], Shaune Pascal, MD  losartan (COZAAR) 100 MG tablet Take 1 tablet (100 mg total) by mouth daily. 06/15/19 12/22/19 Yes [name], Shaune Pascal, MD  ondansetron (ZOFRAN) 4 MG tablet Take 1  tablet (4 mg total) by mouth every 8 (eight) hours as needed for nausea or vomiting. 12/22/19  Yes [name], Shaune Pascal, MD  pantoprazole (PROTONIX) 40 MG tablet Take 40 mg by mouth daily. 10/04/19  Yes [provider]  potassium chloride SA (KLOR-CON M20) 20 MEQ tablet Take 1 tablet (20 mEq total) by mouth 2 (two) times daily. 08/16/19  Yes [name], Shaune Pascal, MD  promethazine (PHENERGAN) 12.5 MG tablet Take 1 tablet (12.5 mg total) by mouth every 6 (six) hours as needed for nausea or vomiting. 12/22/19  Yes [name], Shaune Pascal, MD  traMADol (ULTRAM) 50 MG tablet Take 2 tablets (100 mg total) by mouth every 12 (twelve) hours as needed for moderate pain. 12/22/19  Yes [name], Shaune Pascal, MD  warfarin (COUMADIN) 2.5 MG tablet Take 1.25 mg (1/2 tab) every Friday and 2.5 mg (1 tab) all other days or as directed by heart failure clinic. Patient taking differently: Take 1.25-2.5 mg by mouth See admin instructions. Take 1.25 mg (1/2 tab) every Friday and 2.5 mg (1 tab) all other days or as directed by heart failure clinic. 12/21/19  Yes [name], Shaune Pascal, MD  cyclobenzaprine (FLEXERIL) 5 MG tablet TAKE 1 TABLET BY MOUTH TWICE A DAY AS NEEDED FOR MUSCLE SPASM Patient not taking: Reported on 12/22/2019 08/16/19   [name], Shaune Pascal, MD     Vital Signs: BP 96/68 (BP Location: Left Arm)   Pulse 78  Temp 98.4 F (36.9 C) (Oral)   Resp 20   Ht 5' (1.524 m)   Wt 165 lb 12.6 oz (75.2 kg)   SpO2 97%   BMI 32.38 kg/m   Physical Exam Vitals and nursing note reviewed.   NAD, resting comfortably in bed.  MSK: ROM intact.  Back pain improved.   Imaging: CT ABDOMEN PELVIS W CONTRAST  Result Date: 12/26/2019 CLINICAL DATA:  Nausea and vomiting, abdominal pain EXAM: CT ABDOMEN AND PELVIS WITH CONTRAST TECHNIQUE: Multidetector CT imaging of the abdomen and pelvis was performed using the standard protocol following bolus administration of intravenous contrast. CONTRAST:  128mL OMNIPAQUE IOHEXOL 300  MG/ML  SOLN COMPARISON:  09/05/2015, 12/24/2019 FINDINGS: Lower chest: Extensive hypoventilatory changes at the lung bases. Left ventricular assist device appears grossly stable. Hepatobiliary: No focal liver abnormality is seen. Status post cholecystectomy. No biliary dilatation. Pancreas: Unremarkable. No pancreatic ductal dilatation or surrounding inflammatory changes. Spleen: Normal in size without focal abnormality. Adrenals/Urinary Tract: Bilateral renal cortical thinning with multiple subcentimeter cortical cysts unchanged. No urinary tract calculi or obstructive uropathy. Bladder is unremarkable. The adrenals are normal. Stomach/Bowel: Scattered diverticulosis of the colon without diverticulitis. No bowel obstruction or ileus. Vascular/Lymphatic: Aortic atherosclerosis. No enlarged abdominal or pelvic lymph nodes. Reproductive: Status post hysterectomy. No adnexal masses. Other: No abdominal wall hernia or abnormality. No abdominopelvic ascites. Musculoskeletal: Compression fractures at T11, T12, L1, L3, and L4 are unchanged since recent CT and bone scan. No new fractures. Reconstructed images demonstrate no additional findings. IMPRESSION: 1. No acute intra-abdominal process to explain the patient's symptoms. 2. Diverticulosis without diverticulitis. 3. Stable compression fractures at T11, T12, L1, L3, and L4. 4. Aortic Atherosclerosis (ICD10-I70.0). Electronically Signed   By: Randa Ngo M.D.   On: 12/26/2019 19:27    Labs:  CBC: Recent Labs    12/27/19 0043 12/28/19 0238 12/29/19 0226 12/30/19 0620  WBC 6.2 6.5 7.5 8.4  HGB 10.7* 11.2* 10.6* 10.1*  HCT 33.7* 35.6* 34.3* 32.4*  PLT 209 207 212 211    COAGS: Recent Labs    12/27/19 0043 12/28/19 0238 12/29/19 0226 12/30/19 0620  INR 2.0* 1.5* 1.5* 1.4*    BMP: Recent Labs    12/27/19 0043 12/28/19 0238 12/29/19 0226 12/30/19 0620  NA 135 134* 140 141  K 3.9 3.0* 3.7 3.9  CL 104 103 108 109  CO2 22 21* 24 23    GLUCOSE 115* 97 91 94  BUN <5* <5* 5* 9  CALCIUM 8.5* 8.4* 8.7* 9.1  CREATININE 0.83 0.89 0.83 0.91  GFRNONAA >60 60* >60 58*  GFRAA >60 >60 >60 >60    LIVER FUNCTION TESTS: Recent Labs    04/08/19 0852 11/16/19 1001 12/22/19 1106  BILITOT 0.5 0.5 0.3  AST 27 27 25   ALT 23 19 19   ALKPHOS 83 115 120  PROT 9.2* 9.7* 9.2*  ALBUMIN 3.3* 3.1* 3.0*    Assessment and Plan: Multi-level compression fractures s/p kyphoplasty at T12, L1, L3, and L4 Patient assessed at bedside this afternoon.  Reports her pain has improved by 50%.  Site intact per RN. No planned follow-up with NIR at this time.  Would avoid bending, lifting, stooping for 2 weeks and use a walker at home to support back and posture while fractures heal. Patient verbalizes understanding.   Electronically Signed: Docia Barrier, PA 12/30/2019, 3:00 PM   I spent a total of 15 Minutes at the the patient's bedside AND on the patient's hospital floor or unit, greater  than 50% of which was counseling/coordinating care for T12, L1, L3, and L4 compression fractures.

## 2019-12-30 NOTE — Progress Notes (Signed)
Nutrition Follow-up  DOCUMENTATION CODES:   Non-severe (moderate) malnutrition in context of chronic illness, Obesity unspecified  INTERVENTION:   - Boost Breeze po TID, each supplement provides 250 kcal and 9 grams of protein  - Encourage PO intake  - Recommend liberalizing diet to Regular  NUTRITION DIAGNOSIS:   Moderate Malnutrition related to chronic illness (CHF) as evidenced by mild fat depletion, mild muscle depletion, moderate muscle depletion, percent weight loss (9.1% weight loss in 3 months).  Ongoing, being addressed via supplements  GOAL:   Patient will meet greater than or equal to 90% of their needs  Progressing  MONITOR:   PO intake, Supplement acceptance, Labs, Weight trends, I & O's  REASON FOR ASSESSMENT:   Malnutrition Screening Tool    ASSESSMENT:   85 year old female who was admitted with abdominal pain and refractory N/V due to lumbar compression fracture. PMH of CHF s/p LVAD in 2014, PAF, plasma cell disorder.  4/28 - s/p T10, T12, L3 and L4 vertebroplasties  Spoke with pt at bedside. Pt states that she is feeling much better and nausea is much improved. Pt shares that her appetite is returning and that she ate "almost all" of her breakfast. Noted Boost Breeze at bedside. Pt states that she prefers [employer] over [employer]. RD will adjust orders.  Current weight consistent with admission weight. Pt continues to have mild pitting edema to BLE.  Meal Completion: 0-65% x last 8 meals (improving)  Medications reviewed and include: colace, Ensure Enlive BID, protonix  Labs reviewed.  UOP: 1100 ml x 24 hours  Diet Order:   Diet Order            Diet 2 gram sodium Room service appropriate? Yes; Fluid consistency: Thin  Diet effective now              EDUCATION NEEDS:   Education needs have been addressed  Skin:  Skin Assessment: Skin Integrity Issues: Incisions: vertebral column  Last BM:  12/27/19  Height:   Ht  Readings from Last 1 Encounters:  12/28/19 5' (1.524 m)    Weight:   Wt Readings from Last 1 Encounters:  12/30/19 75.2 kg    Ideal Body Weight:  45.5 kg  BMI:  Body mass index is 32.38 kg/m.  Estimated Nutritional Needs:   Kcal:  1800-2000  Protein:  90-105 grams  Fluid:  >/= 1.5 L    Gaynell Face, MS, RD, LDN Inpatient Clinical Dietitian Pager: [phone] Weekend/After Hours: [phone]

## 2019-12-30 NOTE — Progress Notes (Signed)
PT Cancellation Note  Patient Details Name: Brianna Hanson MRN: [id number] DOB: August 03, 1935   Cancelled Treatment:    Reason Eval/Treat Not Completed: Patient declined, no reason specified(pt supine in bed HOB 30 degrees being fed by significant other. Pt educated for clearance to mobilize but refused. Pt did state pain is somewhat better)   Carsten Carstarphen B [name] 12/30/2019, 8:32 AM  [name], PT Acute Rehabilitation Services Pager: [phone] Office: [phone]

## 2019-12-30 NOTE — Progress Notes (Signed)
Physical Therapy Treatment Patient Details Name: Brianna Hanson MRN: [id number] DOB: 08/12/1935 Today's Date: 12/30/2019    History of Present Illness 85 yo admitted with N/V due to pain from back with T12, L1, L3, L4 compression fx. PMHx: HF, NICM, PAF, plasma cell disorder, LVAD heartmate II 04/06/13, lap chole    PT Comments    Pt pleasant and willing to participate with limited mobility this session. Pt able to transfer OOB with back precautions and transfer to chair. Pt declined further ambulation. Pt educated for further mobility and continued progression.     Follow Up Recommendations  Home health PT;Supervision/Assistance - 24 hour     Equipment Recommendations  None recommended by PT    Recommendations for Other Services       Precautions / Restrictions Precautions Precautions: Fall;Back;Other (comment) Precaution Comments: LVAD, back precautions for comfort with all discussed    Mobility  Bed Mobility Overal bed mobility: Needs Assistance Bed Mobility: Rolling;Sidelying to Sit Rolling: Min assist Sidelying to sit: Min assist       General bed mobility comments: cues for sequence with assist to roll and elevate trunk  Transfers Overall transfer level: Needs assistance   Transfers: Sit to/from Stand Sit to Stand: Min assist Stand pivot transfers: Min assist       General transfer comment: Pt stood from bed x 2 trials with cues safety. Hand held assist to stand and pivot 3' from bed to chair.  Ambulation/Gait             General Gait Details: pt declined attempting   Stairs             Wheelchair Mobility    Modified Rankin (Stroke Patients Only)       Balance Overall balance assessment: Needs assistance   Sitting balance-Leahy Scale: Good       Standing balance-Leahy Scale: Poor Standing balance comment: single UE support in standing                            Cognition Arousal/Alertness: Awake/alert Behavior  During Therapy: WFL for tasks assessed/performed Overall Cognitive Status: Impaired/Different from baseline Area of Impairment: Memory                     Memory: Decreased recall of precautions       Problem Solving: Slow processing        Exercises      General Comments        Pertinent Vitals/Pain Pain Score: 5  Pain Location: back with movement, 2 at rest initially Pain Descriptors / Indicators: Aching;Sore Pain Intervention(s): Limited activity within patient's tolerance;Monitored during session;Repositioned;Patient requesting pain meds-RN notified    Home Living                      Prior Function            PT Goals (current goals can now be found in the care plan section) Progress towards PT goals: Progressing toward goals    Frequency    Min 3X/week      PT Plan Current plan remains appropriate    Co-evaluation              AM-PAC PT "6 Clicks" Mobility   Outcome Measure  Help needed turning from your back to your side while in a flat bed without using bedrails?: A Little Help needed moving from lying on  your back to sitting on the side of a flat bed without using bedrails?: A Little Help needed moving to and from a bed to a chair (including a wheelchair)?: A Little Help needed standing up from a chair using your arms (e.g., wheelchair or bedside chair)?: A Little Help needed to walk in hospital room?: A Lot Help needed climbing 3-5 steps with a railing? : A Lot 6 Click Score: 16    End of Session   Activity Tolerance: Patient limited by fatigue;Patient limited by pain Patient left: in chair;with call bell/phone within reach;with chair alarm set Nurse Communication: Mobility status PT Visit Diagnosis: Difficulty in walking, not elsewhere classified (R26.2);Other abnormalities of gait and mobility (R26.89)     Time: [id number] PT Time Calculation (min) (ACUTE ONLY): 18 min  Charges:  $Therapeutic Activity: 8-22  mins                     Kawthar Ennen P, PT Acute Rehabilitation Services Pager: [phone] Office: [place] B Lucrezia Dehne 12/30/2019, 1:24 PM

## 2019-12-30 NOTE — Progress Notes (Signed)
Reinforced back precautions for comfort and gave pt written handout. Pt with increased pain and propping on UEs seated EOB while instructed in use of AE for LB ADL. Transferring with min hand held assist. Per RN, pt with decreased tolerance of sitting in recliner earlier due to back pain. Will continue to follow.   12/30/19 1400  OT Visit Information  Last OT Received On 12/30/19  Assistance Needed +1  History of Present Illness 85 yo admitted with N/V due to pain from back with T12, L1, L3, L4 compression fx. PMHx: HF, NICM, PAF, plasma cell disorder, LVAD heartmate II 04/06/13, lap chole  Precautions  Precautions Back;Fall  Precaution Booklet Issued Yes (comment)  Precaution Comments reinforced back precautions for comfort  Pain Assessment  Pain Assessment Faces  Faces Pain Scale 8  Pain Location back with movement, 2 at rest initially  Pain Descriptors / Indicators Aching;Sore  Pain Intervention(s) Monitored during session;Repositioned;Premedicated before session  Cognition  Arousal/Alertness Awake/alert  Behavior During Therapy WFL for tasks assessed/performed  Overall Cognitive Status Impaired/Different from baseline  Area of Impairment Memory  Memory Decreased recall of precautions  Problem Solving Slow processing  ADL  Overall ADL's  Needs assistance/impaired  Lower Body Bathing Details (indicate cue type and reason) educated in use of long handled bath sponge and reacher  Lower Body Dressing Details (indicate cue type and reason) educated in use of sock aide and reacher, pt observed but did not Games developer Minimal assistance;Stand-pivot;BSC  Toileting- Clothing Manipulation and Hygiene Minimal assistance;Sit to/from stand  Bed Mobility  Overal bed mobility Needs Assistance  Bed Mobility Rolling;Sidelying to Sit;Sit to Sidelying  Rolling Min assist  Sidelying to sit Min assist  Sit to sidelying Min assist  General bed mobility comments cues for log roll  technique  Balance  Overall balance assessment Needs assistance  Sitting-balance support Bilateral upper extremity supported  Sitting balance-Leahy Scale Fair  Sitting balance - Comments bracing for pain on B UEs  Standing balance-Leahy Scale Poor  Standing balance comment single UE support in standing during pericare  Transfers  Equipment used 1 person hand held assist  Transfers Sit to/from [employer] Transfers  Sit to Stand Min assist  Stand pivot transfers Min assist  General transfer comment bed<>BSC  OT - End of Session  Activity Tolerance Patient limited by pain  Patient left in bed;with call bell/phone within reach;with bed alarm set  OT Assessment/Plan  OT Plan Discharge plan remains appropriate  OT Visit Diagnosis Unsteadiness on feet (R26.81);Other abnormalities of gait and mobility (R26.89);Muscle weakness (generalized) (M62.81);Pain  OT Frequency (ACUTE ONLY) Min 2X/week  Follow Up Recommendations Home health OT;Supervision/Assistance - 24 hour  OT Equipment None recommended by OT  AM-PAC OT "6 Clicks" Daily Activity Outcome Measure (Version 2)  Help from another person eating meals? 4  Help from another person taking care of personal grooming? 3  Help from another person toileting, which includes using toliet, bedpan, or urinal? 2  Help from another person bathing (including washing, rinsing, drying)? 2  Help from another person to put on and taking off regular upper body clothing? 3  Help from another person to put on and taking off regular lower body clothing? 2  6 Click Score 16  OT Goal Progression  Progress towards OT goals Progressing toward goals  Acute Rehab OT Goals  Patient Stated Goal be able to walk and get rid of back pain  OT Goal Formulation With patient  Time For Goal Achievement  01/06/20  Potential to Achieve Goals Good  OT Time Calculation  OT Start Time (ACUTE ONLY) 1420  OT Stop Time (ACUTE ONLY) 1441  OT Time Calculation (min) 21 min   OT General Charges  $OT Visit 1 Visit  OT Treatments  $Self Care/Home Management  8-22 mins  Nestor Lewandowsky, OTR/L Acute Rehabilitation Services Pager: [phone] Office: [phone]

## 2019-12-30 NOTE — Progress Notes (Signed)
LVAD Coordinator Rounding Note:  Admitted to Dr. Clayborne Dana service on 12/22/19 due to abdominal pain with refractory nausea and vomiting due to lumbar compression fracture. [name]   HM II LVAD implanted on 04/06/13 by Dr. Cyndia Bent under Destination Therapy criteria due to advanced age.   Pt lying in bed this morning, states that she doesn't have any pain but pt refused to get up with PT this morning. Pt informed that she needs to get up daily and work w/PT. She is agreeable.   Vital signs: Temp:  98.4 HR: 77 Doppler Pressure: not done Automatic BP: 110/79 (89) O2 Sat: 97% RA Wt: 164.9>162.7>153.4>165.1>159.8>155.2>165.7 lbs   LVAD interrogation reveals:  Speed:  8600 Flow:  3.7 Power:  4.3w PI: 7.1 Alarms: none last 24 hrs Events: 1 event today  Fixed speed: 8600 Low speed limit: 8000  Drive Line:  C/D/I with anchor intact and accurately applied. Weekly dressing changes per bedside nurse or patient's husband. Next dressing change due today, 12/30/19. [name] at bedside and will change dressing today.  Labs:  LDH trend: 203>219>202>179>177>189>249  INR trend: 3.6>3.1>2.8>2.0>1.5>1.5>1.4  Anticoagulation Plan: -INR Goal: 2.0 - 2.5 - holding warfarin  -ASA Dose: none (intolerance) -Heparin 500 u/hr  Device:  St Jude dual ICD - Pacing: DDD 60 -Therapies: on 200   Plan/Recommendations:  1. Call VAD Coordinator for any VAD equipment or drive line issues. 2. Weekly LVAD drive line dressing changes per bedside nurse or patient's husband.  Tanda Rockers RN [place] Coordinator  Office: [phone]  24/7 Pager: [phone]

## 2019-12-31 DIAGNOSIS — S32000A Wedge compression fracture of unspecified lumbar vertebra, initial encounter for closed fracture: Secondary | ICD-10-CM | POA: Diagnosis not present

## 2019-12-31 DIAGNOSIS — Z95811 Presence of heart assist device: Secondary | ICD-10-CM | POA: Diagnosis not present

## 2019-12-31 DIAGNOSIS — I5022 Chronic systolic (congestive) heart failure: Secondary | ICD-10-CM | POA: Diagnosis not present

## 2019-12-31 DIAGNOSIS — R112 Nausea with vomiting, unspecified: Secondary | ICD-10-CM | POA: Diagnosis not present

## 2019-12-31 LAB — BPAM RBC
Blood Product Expiration Date: 202105292359
Blood Product Expiration Date: 202105292359
Unit Type and Rh: 5100
Unit Type and Rh: 5100

## 2019-12-31 LAB — BASIC METABOLIC PANEL
Anion gap: 9 (ref 5–15)
BUN: 8 mg/dL (ref 8–23)
CO2: 23 mmol/L (ref 22–32)
Calcium: 8.8 mg/dL — ABNORMAL LOW (ref 8.9–10.3)
Chloride: 108 mmol/L (ref 98–111)
Creatinine, Ser: 0.86 mg/dL (ref 0.44–1.00)
GFR calc Af Amer: >60 mL/min (ref >60)
GFR calc non Af Amer: >60 mL/min (ref >60)
Glucose, Bld: 99 mg/dL (ref 70–99)
Potassium: 3.6 mmol/L (ref 3.5–5.1)
Sodium: 140 mmol/L (ref 135–145)

## 2019-12-31 LAB — CBC
HCT: 31.1 % — ABNORMAL LOW (ref 36.0–46.0)
Hemoglobin: 9.5 g/dL — ABNORMAL LOW (ref 12.0–15.0)
MCH: 30.5 pg (ref 26.0–34.0)
MCHC: 30.5 g/dL (ref 30.0–36.0)
MCV: 100 fL (ref 80.0–100.0)
Platelets: 203 10*3/uL (ref 150–400)
RBC: 3.11 MIL/uL — ABNORMAL LOW (ref 3.87–5.11)
RDW: 14.8 % (ref 11.5–15.5)
WBC: 6.7 10*3/uL (ref 4.0–10.5)
nRBC: 0 % (ref 0.0–0.2)

## 2019-12-31 LAB — TYPE AND SCREEN
ABO/RH(D): O POS
Antibody Screen: NEGATIVE
Unit division: 0
Unit division: 0

## 2019-12-31 LAB — LACTATE DEHYDROGENASE: LDH: 222 U/L — ABNORMAL HIGH (ref 98–192)

## 2019-12-31 LAB — HEPARIN LEVEL (UNFRACTIONATED): Heparin Unfractionated: <0.1 IU/mL — ABNORMAL LOW (ref 0.30–0.70)

## 2019-12-31 LAB — PROTIME-INR
INR: 1.3 — ABNORMAL HIGH (ref 0.8–1.2)
Prothrombin Time: 15.6 seconds — ABNORMAL HIGH (ref 11.4–15.2)

## 2019-12-31 MED ORDER — POTASSIUM CHLORIDE ER 10 MEQ PO TBCR
40.0000 meq | EXTENDED_RELEASE_TABLET | Freq: Once | ORAL | Status: AC
  Administered 2019-12-31: 40 meq via ORAL
  Filled 2019-12-31 (×2): qty 4

## 2019-12-31 MED ORDER — WARFARIN - PHARMACIST DOSING INPATIENT: Freq: Every day | Status: DC

## 2019-12-31 MED ORDER — WARFARIN SODIUM 2.5 MG PO TABS
2.5000 mg | ORAL_TABLET | Freq: Once | ORAL | Status: AC
  Administered 2019-12-31: 2.5 mg via ORAL
  Filled 2019-12-31: qty 1

## 2019-12-31 NOTE — Progress Notes (Signed)
[place] for heparin Indication: hx LVAD  Allergies  Allergen Reactions  . Oxycodone Other (See Comments)    Hallucinations     Patient Measurements: Height: 5' (152.4 cm) Weight: 71.8 kg (158 lb 4.6 oz) IBW/kg (Calculated) : 45.5 Heparin Dosing Weight: 61.6 kg   Vital Signs: Temp: 98.2 F (36.8 C) (04/30 0753) Temp Source: Oral (04/30 0753) BP: 106/75 (04/30 0753) Pulse Rate: 69 (04/30 0753)  Labs: Recent Labs    12/29/19 0226 12/29/19 0226 12/30/19 0620 12/31/19 0218  HGB 10.6*   < > 10.1* 9.5*  HCT 34.3*  --  32.4* 31.1*  PLT 212  --  211 203  LABPROT 17.4*  --  16.2* 15.6*  INR 1.5*  --  1.4* 1.3*  HEPARINUNFRC <0.10*  --  <0.10* <0.10*  CREATININE 0.83  --  0.91 0.86   < > = values in this interval not displayed.    Estimated Creatinine Clearance: 43.8 mL/min (by C-G formula based on SCr of 0.86 mg/dL).   Medical History: Past Medical History:  Diagnosis Date  . AICD (automatic cardioverter/defibrillator) present   . Anemia   . Atrial fibrillation or flutter   . Cardiac arrest - ventricular fibrillation 12/10   with successful resucitation, S/p ICD  . CHF (congestive heart failure) ([place])   . Diverticula of colon 2011  . HTN (hypertension)    moderate  . ICD (implantable cardiac defibrillator) in place    she has received appropriate therapy for VF  . Internal and external hemorrhoids without complication [id number]  . Nonischemic cardiomyopathy ([place])    follow[facility] by Dr April Holding at [facility]  . Osteopenia   . Plasma cell disorder 03/20/2012  . Plasma cell disorder 03/20/2012  . Sessile colonic polyp 2011   Dr Benson [place]    Medications:  Scheduled:  . sodium chloride   Intravenous Once  . amiodarone  200 mg Oral Daily  . calcitonin (salmon)  1 spray Alternating Nares Daily  . carvedilol  6.25 mg Oral BID WC  . docusate sodium  100 mg Oral Daily  . feeding supplement  1 Container Oral TID BM  . gabapentin  300 mg Oral  BID  . hydrALAZINE  100 mg Oral TID  . losartan  100 mg Oral Daily  . pantoprazole sodium  20 mg Oral Daily  . potassium chloride  40 mEq Oral Once  . warfarin  2.5 mg Oral ONCE-1600  . Warfarin - Pharmacist Dosing Inpatient   Does not apply q1600    Assessment: 7 yof with HM2 LVAD, presenting with nausea/vomiting due to multilevel lumbar compression fracture - awaiting vertebroplasty scheduled for 4/28.  Warfarin held and low-dose heparin started. Per discussion with MD, will check heparin level to ensure level not high but will not adjust if low. INR today 1.4. Hgb stable 10., plt stable 200s. LDH 189>249 slight bump post op - watch.  Heparin level remains undetectable on heparin drip 500uts/hr. Will adjust slowly with MD agreement as per  previous pharmacist discussion with MD. No current active bleed issues reported. Restart warfarin INR 1.3   PTA dose Warfarin 5mg  MF 2.5 all other days  (dose decreased PTA recently with N/V with back pain)   Goal of Therapy:  Heparin level 0.3-0.5 units/ml INR 1.8-2.3 Monitor platelets by anticoagulation protocol: Yes   Plan:  Continue low-dose heparin infusion but increase to 600 units/hr Restart warfarin today - low dose titrate slowly 2.5mg  x1  Monitor s/s bleeding  Daily HL, Protime, CBC  Bonnita Nasuti Pharm.D. CPP, BCPS Clinical Pharmacist [phone] 12/31/2019 10:26 AM    Please check [email or web address] for unit-specific pharmacist phone numbers

## 2019-12-31 NOTE — Progress Notes (Signed)
Advanced Heart Failure VAD Team Note  PCP-Cardiologist: No primary care provider on file.   Subjective:   85 y/o female w/ NICM, s/p ICD and LVAD for DT(04/2013), Admitted with N/V, abd pain due to multilevel lumbar compression fractures. Awaiting multi-level vertebroplasty.   S/p multi-level vertebroplasty on 4/28. Back pain improved. Walked today with PT. Appetite good. On heparin.  INR 1.3 Hgb 10.1 -> 9.5  LVAD INTERROGATION:  HeartMate II LVAD:   Flow 3.8  liters/min, speed 8600, power 4.0  PI 6.8  VAD interrogated personally. Parameters stable.    Objective:    Vital Signs:   Temp:  [98.2 F (36.8 C)-98.7 F (37.1 C)] 98.2 F (36.8 C) (04/30 0753) Pulse Rate:  [66-81] 69 (04/30 0753) Resp:  [18-21] 18 (04/30 0753) BP: (89-113)/(58-87) 106/75 (04/30 0753) SpO2:  [95 %-100 %] 97 % (04/30 0753) Weight:  [71.8 kg] 71.8 kg (04/30 0500) Last BM Date: 12/26/19 Mean arterial Pressure 80-90s    Intake/Output:   Intake/Output Summary (Last 24 hours) at 12/31/2019 1019 Last data filed at 12/31/2019 0900 Gross per 24 hour  Intake 832.65 ml  Output 750 ml  Net 82.65 ml     Physical Exam   General:  Lying in bed NAD.  HEENT: normal  Neck: supple. JVP not elevated.  Carotids 2+ bilat; no bruits. No lymphadenopathy or thryomegaly appreciated. Cor: LVAD hum.  Lungs: Clear. Abdomen:  soft, nontender, non-distended. No hepatosplenomegaly. No bruits or masses. Good bowel sounds. Driveline site clean. Anchor in place.  Extremities: no cyanosis, clubbing, rash. Warm no edema  Neuro: alert & oriented x 3. No focal deficits. Moves all 4 without problem      Telemetry   NSR 60-70s Personally reviewed   Labs   Basic Metabolic Panel: Recent Labs  Lab 12/27/19 0043 12/27/19 0043 12/28/19 0238 12/28/19 0238 12/29/19 0226 12/30/19 0620 12/31/19 0218  NA 135  --  134*  --  140 141 140  K 3.9  --  3.0*  --  3.7 3.9 3.6  CL 104  --  103  --  108 109 108  CO2 22  --  21*   --  24 23 23   GLUCOSE 115*  --  97  --  91 94 99  BUN <5*  --  <5*  --  5* 9 8  CREATININE 0.83  --  0.89  --  0.83 0.91 0.86  CALCIUM 8.5*   < > 8.4*   < > 8.7* 9.1 8.8*  MG 1.5*  --  2.3  --  2.0 1.8  --    < > = values in this interval not displayed.    Liver Function Tests: No results for input(s): AST, ALT, ALKPHOS, BILITOT, PROT, ALBUMIN in the last 168 hours. No results for input(s): LIPASE, AMYLASE in the last 168 hours. No results for input(s): AMMONIA in the last 168 hours.  CBC: Recent Labs  Lab 12/27/19 0043 12/28/19 0238 12/29/19 0226 12/30/19 0620 12/31/19 0218  WBC 6.2 6.5 7.5 8.4 6.7  HGB 10.7* 11.2* 10.6* 10.1* 9.5*  HCT 33.7* 35.6* 34.3* 32.4* 31.1*  MCV 97.4 97.3 100.0 99.4 100.0  PLT 209 207 212 211 203    INR: Recent Labs  Lab 12/27/19 0043 12/28/19 0238 12/29/19 0226 12/30/19 0620 12/31/19 0218  INR 2.0* 1.5* 1.5* 1.4* 1.3*    Other results:      Imaging   No results found.   Medications:     Scheduled Medications: . sodium  chloride   Intravenous Once  . amiodarone  200 mg Oral Daily  . calcitonin (salmon)  1 spray Alternating Nares Daily  . carvedilol  6.25 mg Oral BID WC  . docusate sodium  100 mg Oral Daily  . feeding supplement  1 Container Oral TID BM  . gabapentin  300 mg Oral BID  . hydrALAZINE  100 mg Oral TID  . losartan  100 mg Oral Daily  . pantoprazole sodium  20 mg Oral Daily    Infusions: . heparin 500 Units/hr (12/30/19 1721)    PRN Medications: acetaminophen, bupivacaine, fentaNYL (SUBLIMAZE) injection, ondansetron (ZOFRAN) IV, promethazine, traMADol    Assessment/Plan:    1) Ab pain in setting of intractable n/v due to her back pain and pain meds - CT abdomen/pelvis negative.  - Pain now resolved.   2) Low back and left hip pain in setting of L1 compression fracture and new L3 fracture - CT and bone scan with multilevel compression fx at T10, T12, L1, L3, and L4. - Underwent multilevel  vertebroplasty with Dr. Estanislado Pandy on 4/28.  - Pain improved - PT/OT following. Was able to walk today  3) Anorexia - Much improved - CT A/P unremarkable  4) Chronic systolic HF: NICM, s/p ICD and LVAD for DT(04/2013).  - now 6+ years out on VAD support -  Typically NYHA II but now IIIb in setting of ortho issues - Volume status improved after IVF. Low flows resolved.   5) LVAD placed for DT 04/2013:  - Failed Entresto due to diuresis and incessant low flow alarms.  - VAD interrogated personally. Parameters stable. - No ASA with GI bleed and intolerance. - LDH 222 - INR 1.3 Goal 1.8-2.3. - on low-dose heparin at 500. No bleeding. Will increase to 600 - restart warfarin. Discussed dosing with PharmD personally.  6) PAF:  - Did not tolerate AF well at all.  - S/p DC-CV 06/18/19 - In NSR on tele - Continue amio 200 daily.  - Restart warfarin - No change  7) HTN:  - MAPs ok   8) VT - possibly due to suction event  - no further VT after IVFs - keep K> 4.0 Mg > 2.0 - Mag 2.0. K 3.7  - supp K  Stop heparin and d/c home when INR 1.8 or greater   Length of Stay: 9  Glori Bickers, MD 12/31/2019, 10:19 AM  VAD Team --- VAD ISSUES ONLY--- Pager [phone] (7am - 7am)  Advanced Heart Failure Team  Pager [phone] (M-F; [place])  Please contact [facility] for night-coverage after hours (4p -7a ) and weekends on [email or web address]

## 2019-12-31 NOTE — Anesthesia Postprocedure Evaluation (Signed)
Anesthesia Post Note  Patient: Brianna Hanson  Procedure(s) Performed: KYPHOPLASTY (N/A )     Patient location during evaluation: PACU Anesthesia Type: General Level of consciousness: patient cooperative and awake Pain management: pain level controlled Vital Signs Assessment: post-procedure vital signs reviewed and stable Respiratory status: spontaneous breathing, nonlabored ventilation, respiratory function stable and patient connected to nasal cannula oxygen Cardiovascular status: stable Postop Assessment: no apparent nausea or vomiting Anesthetic complications: no    Last Vitals:  Vitals:   12/31/19 0346 12/31/19 0753  BP: 113/87 106/75  Pulse: 66 69  Resp: 18 18  Temp: 37 C 36.8 C  SpO2: 99% 97%    Last Pain:  Vitals:   12/31/19 0753  TempSrc: Oral  PainSc: 0-No pain                 Giovana Faciane

## 2019-12-31 NOTE — Progress Notes (Signed)
Physical Therapy Treatment Patient Details Name: Brianna Hanson MRN: [id number] DOB: [birth year] Today's Date: 12/31/2019    History of Present Illness 85 yo admitted with N/V due to pain from back with T12, L1, L3, L4 compression fx. PMHx: HF, NICM, PAF, plasma cell disorder, LVAD heartmate II 04/06/13, lap chole    PT Comments    Pt supine smiling with Brianna Hanson present throughout session. Pt reports standing for weight this am and willing to progress mobility and sit in chair this session. Pt walked around bed to chair and performed limited HEP but showing improvement as this is the first even limited gait trial since admission. Pt encouraged to continue to increased mobility with nursing. Will continue to follow.   Speed 8600, flow 4.0, pulse index 6.8, pulse power 4.2 HR 88 SpO2 99% on RA    Follow Up Recommendations  Home health PT;Supervision/Assistance - 24 hour     Equipment Recommendations  None recommended by PT    Recommendations for Other Services       Precautions / Restrictions Precautions Precautions: Back;Fall;Other (comment) Precaution Comments: reinforced back precautions for comfort, LVAD    Mobility  Bed Mobility Overal bed mobility: Needs Assistance Bed Mobility: Rolling;Sidelying to Sit Rolling: Min guard Sidelying to sit: Min guard       General bed mobility comments: cues for log roll technique, increased time with use of rail  Transfers Overall transfer level: Needs assistance   Transfers: Sit to/from Stand Sit to Stand: Min assist         General transfer comment: min assist to rise from bed with 2 trials and increased time  Ambulation/Gait Ambulation/Gait assistance: Min assist;+2 safety/equipment Gait Distance (Feet): 16 Feet Assistive device: 2 person hand held assist Gait Pattern/deviations: Step-through pattern;Decreased stride length   Gait velocity interpretation: <1.8 ft/sec, indicate of risk for recurrent falls General Gait  Details: short steps, pt refused RW but required bil HHA with education for need to progress gait and utilize Brianna Hanson (Stroke Patients Only)       Balance Overall balance assessment: Needs assistance   Sitting balance-Leahy Scale: Fair     Standing balance support: Bilateral upper extremity supported Standing balance-Leahy Scale: Poor Standing balance comment: bil UE support for standing and gait                            Cognition Arousal/Alertness: Awake/alert Behavior During Therapy: WFL for tasks assessed/performed Overall Cognitive Status: Impaired/Different from baseline                               Problem Solving: Slow processing        Exercises General Exercises - Lower Extremity Long Arc Quad: AROM;Both;Seated;10 reps Hip Flexion/Marching: Right;Left;Seated;10 reps;Other (comment)(3 reps on LLE)    General Comments        Pertinent Vitals/Pain Pain Score: 5  Pain Location: back with movement, 0 at rest initially Pain Descriptors / Indicators: Aching;Sore Pain Intervention(s): Limited activity within patient's tolerance;Monitored during session;Repositioned    Home Living                      Prior Function            PT Goals (current goals can now be  found in the care plan section) Progress towards PT goals: Progressing toward goals    Frequency    Min 3X/week      PT Plan Current plan remains appropriate    Co-evaluation              AM-PAC PT "6 Clicks" Mobility   Outcome Measure  Help needed turning from your back to your side while in a flat bed without using bedrails?: A Little Help needed moving from lying on your back to sitting on the side of a flat bed without using bedrails?: A Little Help needed moving to and from a bed to a chair (including a wheelchair)?: A Little Help needed standing up from a chair using your arms  (e.g., wheelchair or bedside chair)?: A Little Help needed to walk in hospital room?: A Lot Help needed climbing 3-5 steps with a railing? : A Lot 6 Click Score: 16    End of Session Equipment Utilized During Treatment: Gait belt Activity Tolerance: Patient tolerated treatment well Patient left: in chair;with call bell/phone within reach;with chair alarm set;with family/visitor present Nurse Communication: Mobility status;Precautions PT Visit Diagnosis: Difficulty in walking, not elsewhere classified (R26.2);Other abnormalities of gait and mobility (R26.89)     Time: [id number] PT Time Calculation (min) (ACUTE ONLY): 13 min  Charges:  $Therapeutic Activity: 8-22 mins                     Beyonka Pitney P, PT Acute Rehabilitation Services Pager: [phone] Office: [place] Aslynn Brunetti 12/31/2019, 12:27 PM

## 2019-12-31 NOTE — Progress Notes (Signed)
LVAD Coordinator Rounding Note:  Admitted to Dr. Clayborne Dana service on 12/22/19 due to abdominal pain with refractory nausea and vomiting due to lumbar compression fracture. [name]   HM II LVAD implanted on 04/06/13 by Dr. Cyndia Bent under Destination Therapy criteria due to advanced age.   Pt lying in bed asleep. Per bedside RN she spent 20 minutes in bedside recliner this morning and since then has sleeping.   Vital signs: Temp:  98.2 HR: 76 Doppler Pressure: 90 Automatic BP: 101/67 (79) O2 Sat: 95% RA Wt: 164.9>162.7>153.4>165.1>159.8>155.2>165.7>158.2 lbs   LVAD interrogation reveals:  Speed:  8600 Flow:  3.6 Power:  4.2w PI: 7.0 Alarms: none last 24 hrs Events: 1 event today  Fixed speed: 8600 Low speed limit: 8000  Drive Line:  C/D/I with anchor intact and accurately applied. Weekly dressing changes per bedside nurse or patient's husband. Next dressing change due 01/06/20.   Labs:  LDH trend: 203>219>202>179>177>189>249>222  INR trend: 3.6>3.1>2.8>2.0>1.5>1.5>1.4>1.3  Anticoagulation Plan: -INR Goal: 2.0 - 2.5 - holding warfarin  -ASA Dose: none (intolerance)  -Heparin 500 u/hr  Device:  St Jude dual ICD - Pacing: DDD 60 -Therapies: on 200   Plan/Recommendations:  1. Call VAD Coordinator for any VAD equipment or drive line issues. 2. Weekly LVAD drive line dressing changes per bedside nurse or patient's husband.  Emerson Monte RN [employer] Coordinator  Office: [phone]  24/7 Pager: [phone]

## 2019-12-31 NOTE — Plan of Care (Signed)
  Problem: Clinical Measurements: Goal: Ability to maintain clinical measurements within normal limits will improve Outcome: Progressing Goal: Will remain free from infection Outcome: Progressing Goal: Respiratory complications will improve Outcome: Progressing Goal: Cardiovascular complication will be avoided Outcome: Progressing   

## 2020-01-01 DIAGNOSIS — Z95811 Presence of heart assist device: Secondary | ICD-10-CM | POA: Diagnosis not present

## 2020-01-01 DIAGNOSIS — I5022 Chronic systolic (congestive) heart failure: Secondary | ICD-10-CM | POA: Diagnosis not present

## 2020-01-01 LAB — CBC
HCT: 32.1 % — ABNORMAL LOW (ref 36.0–46.0)
Hemoglobin: 10 g/dL — ABNORMAL LOW (ref 12.0–15.0)
MCH: 30.9 pg (ref 26.0–34.0)
MCHC: 31.2 g/dL (ref 30.0–36.0)
MCV: 99.1 fL (ref 80.0–100.0)
Platelets: 210 10*3/uL (ref 150–400)
RBC: 3.24 MIL/uL — ABNORMAL LOW (ref 3.87–5.11)
RDW: 14.9 % (ref 11.5–15.5)
WBC: 6.8 10*3/uL (ref 4.0–10.5)
nRBC: 0 % (ref 0.0–0.2)

## 2020-01-01 LAB — BASIC METABOLIC PANEL
Anion gap: 9 (ref 5–15)
BUN: 9 mg/dL (ref 8–23)
CO2: 24 mmol/L (ref 22–32)
Calcium: 8.7 mg/dL — ABNORMAL LOW (ref 8.9–10.3)
Chloride: 109 mmol/L (ref 98–111)
Creatinine, Ser: 0.84 mg/dL (ref 0.44–1.00)
GFR calc Af Amer: >60 mL/min (ref >60)
GFR calc non Af Amer: >60 mL/min (ref >60)
Glucose, Bld: 103 mg/dL — ABNORMAL HIGH (ref 70–99)
Potassium: 4 mmol/L (ref 3.5–5.1)
Sodium: 142 mmol/L (ref 135–145)

## 2020-01-01 LAB — PROTIME-INR
INR: 1.2 (ref 0.8–1.2)
Prothrombin Time: 14.8 seconds (ref 11.4–15.2)

## 2020-01-01 LAB — LACTATE DEHYDROGENASE: LDH: 200 U/L — ABNORMAL HIGH (ref 98–192)

## 2020-01-01 LAB — HEPARIN LEVEL (UNFRACTIONATED)
Heparin Unfractionated: <0.1 IU/mL — ABNORMAL LOW (ref 0.30–0.70)
Heparin Unfractionated: 0.14 IU/mL — ABNORMAL LOW (ref 0.30–0.70)

## 2020-01-01 MED ORDER — SODIUM CHLORIDE 0.9 % IV BOLUS
250.0000 mL | Freq: Once | INTRAVENOUS | Status: AC
  Administered 2020-01-01: 250 mL via INTRAVENOUS

## 2020-01-01 MED ORDER — WARFARIN SODIUM 2.5 MG PO TABS
2.5000 mg | ORAL_TABLET | Freq: Once | ORAL | Status: AC
  Administered 2020-01-01: 2.5 mg via ORAL
  Filled 2020-01-01: qty 1

## 2020-01-01 NOTE — Progress Notes (Signed)
[place] for heparin and warfarin Indication: hx LVAD  Patient Measurements: Height: 5' (152.4 cm) Weight: 71.4 kg (157 lb 6.5 oz) IBW/kg (Calculated) : 45.5 Heparin Dosing Weight: 61.6 kg   Vital Signs: Temp: 98.1 F (36.7 C) (05/01 1927) Temp Source: Oral (05/01 1927) BP: 94/73 (05/01 1927) Pulse Rate: 72 (05/01 1927)  Labs: Recent Labs    12/30/19 0620 12/30/19 0620 12/31/19 0218 01/01/20 0207 01/01/20 1750  HGB 10.1*   < > 9.5* 10.0*  --   HCT 32.4*  --  31.1* 32.1*  --   PLT 211  --  203 210  --   LABPROT 16.2*  --  15.6* 14.8  --   INR 1.4*  --  1.3* 1.2  --   HEPARINUNFRC <0.10*   < > <0.10* <0.10* 0.14*  CREATININE 0.91  --  0.86 0.84  --    < > = values in this interval not displayed.    Estimated Creatinine Clearance: 44.8 mL/min (by C-G formula based on SCr of 0.84 mg/dL).   Assessment: 8 yof with HM2 LVAD, presenting with nausea/vomiting due to multilevel lumbar compression fracture - s/p vertebroplasty 4/28.  Warfarin held and low-dose heparin started. Per discussion with MD, will check heparin level to ensure level not high but will not adjust if low. HL at 0.14 - will not adjust per AM discussion with MD.  PTA dose Warfarin 5mg  MF 2.5 all other days  (dose decreased PTA recently with N/V with back pain)   Goal of Therapy:  Heparin level 0.3-0.5 units/ml INR 1.8-2.3 Monitor platelets by anticoagulation protocol: Yes   Plan:  Continue low-dose heparin infusion at 700 units/hr   S/p Warfarin 2.5 mg x 1 tonight.  Monitor s/s bleeding  Daily heparin level, INR, CBC   Benetta Spar, PharmD, BCPS, BCCP Clinical Pharmacist  Please check AMION for all [place] phone numbers After 10:00 PM, call [place] [phone]

## 2020-01-01 NOTE — Plan of Care (Signed)

## 2020-01-01 NOTE — Progress Notes (Signed)
Patient had low flow alarms briefly when she was transferred from [facility] to bed. Notified VAD coordinator Ebony Hail regarding this matter as well as hypotension. Ebony Hail didn't want to give bolus fluid, but give increased oral intake at least 2L/ day. Ebony Hail talked Dr. Aundra Dubin regarding BP meds. Hold MAP 70's and if MAP up 80 or 90 then give BP medications. Will hold hydralazine and coreg if MAP less than 80. Ordered NS bolus 250 ml x one time now. HS [employer]

## 2020-01-01 NOTE — Plan of Care (Signed)
  Problem: Clinical Measurements: Goal: Ability to maintain clinical measurements within normal limits will improve Outcome: Progressing Goal: Will remain free from infection Outcome: Progressing Goal: Diagnostic test results will improve Outcome: Progressing Goal: Respiratory complications will improve Outcome: Progressing Goal: Cardiovascular complication will be avoided Outcome: Progressing   Problem: Activity: Goal: Risk for activity intolerance will decrease Outcome: Progressing   Problem: Nutrition: Goal: Adequate nutrition will be maintained Outcome: Progressing   Problem: Coping: Goal: Level of anxiety will decrease Outcome: Progressing   Problem: Elimination: Goal: Will not experience complications related to bowel motility Outcome: Progressing Goal: Will not experience complications related to urinary retention Outcome: Progressing   Problem: Pain Managment: Goal: General experience of comfort will improve Outcome: Progressing   Problem: Safety: Goal: Ability to remain free from injury will improve Outcome: Progressing   Problem: Skin Integrity: Goal: Risk for impaired skin integrity will decrease Outcome: Progressing   Problem: Education: Goal: Patient will be able to verbalize current INR target range and antiplatelet therapy for discharge home Outcome: Progressing

## 2020-01-01 NOTE — Progress Notes (Addendum)
76. BP was 93/66 in the morning and hold 10 am hydralazine dose.  BP was 88/67 MAP at 1255. Will hold hydralazine and coreg. Paging Dr. Aundra Dubin for this matter and awaiting for call back. HS [employer]

## 2020-01-01 NOTE — Progress Notes (Signed)
Patient ID: Brianna Hanson, female   DOB: 19-Feb-1935, 85 y.o.   MRN: [id number]   Advanced Heart Failure VAD Team Note  PCP-Cardiologist: No primary care provider on file.   Subjective:   85 y/o female w/ NICM, s/p ICD and LVAD for DT(04/2013), Admitted with N/V, abd pain due to multilevel lumbar compression fractures. Awaiting multi-level vertebroplasty.   S/p multi-level vertebroplasty on 4/28. Back pain improved considerably.  Did some walking yesterday. Appetite good. On heparin.  INR 1.2. Hgb 10.1 -> 9.5 -> 10.   LDH 200.   LVAD INTERROGATION:  HeartMate II LVAD:   Flow 3.4  liters/min, speed 8600, power 3.9  PI 6.6. No PI events.   VAD interrogated personally. Parameters stable.    Objective:    Vital Signs:   Temp:  [98.1 F (36.7 C)-98.8 F (37.1 C)] 98.1 F (36.7 C) (05/01 0827) Pulse Rate:  [70-83] 70 (05/01 0827) Resp:  [16-22] 16 (05/01 0827) BP: (94-107)/(67-85) 100/85 (05/01 0827) SpO2:  [95 %-100 %] 100 % (05/01 0827) Weight:  [71.4 kg] 71.4 kg (05/01 0452) Last BM Date: 01/29/20 Mean arterial Pressure 80s-90s   Intake/Output:   Intake/Output Summary (Last 24 hours) at 01/01/2020 0955 Last data filed at 01/01/2020 0600 Gross per 24 hour  Intake 971.11 ml  Output 300 ml  Net 671.11 ml     Physical Exam   General: Well appearing this am. NAD.  HEENT: Normal. Neck: Supple, JVP 7-8 cm. Carotids OK.  Cardiac:  Mechanical heart sounds with LVAD hum present.  Lungs:  CTAB, normal effort.  Abdomen:  NT, ND, no HSM. No bruits or masses. +BS  LVAD exit site: Well-healed and incorporated. Dressing dry and intact. No erythema or drainage. Stabilization device present and accurately applied. Driveline dressing changed daily per sterile technique. Extremities:  Warm and dry. No cyanosis, clubbing, rash, or edema.  Neuro:  Alert & oriented x 3. Cranial nerves grossly intact. Moves all 4 extremities w/o difficulty. Affect pleasant    Telemetry   NSR 60-70s Personally  reviewed   Labs   Basic Metabolic Panel: Recent Labs  Lab 12/27/19 0043 12/27/19 0043 12/28/19 0238 12/28/19 0238 12/29/19 0226 12/29/19 0226 12/30/19 0620 12/31/19 0218 01/01/20 0207  NA 135   < > 134*  --  140  --  141 140 142  K 3.9   < > 3.0*  --  3.7  --  3.9 3.6 4.0  CL 104   < > 103  --  108  --  109 108 109  CO2 22   < > 21*  --  24  --  23 23 24   GLUCOSE 115*   < > 97  --  91  --  94 99 103*  BUN <5*   < > <5*  --  5*  --  9 8 9   CREATININE 0.83   < > 0.89  --  0.83  --  0.91 0.86 0.84  CALCIUM 8.5*   < > 8.4*   < > 8.7*   < > 9.1 8.8* 8.7*  MG 1.5*  --  2.3  --  2.0  --  1.8  --   --    < > = values in this interval not displayed.    Liver Function Tests: No results for input(s): AST, ALT, ALKPHOS, BILITOT, PROT, ALBUMIN in the last 168 hours. No results for input(s): LIPASE, AMYLASE in the last 168 hours. No results for input(s): AMMONIA in the last  168 hours.  CBC: Recent Labs  Lab 12/28/19 0238 12/29/19 0226 12/30/19 0620 12/31/19 0218 01/01/20 0207  WBC 6.5 7.5 8.4 6.7 6.8  HGB 11.2* 10.6* 10.1* 9.5* 10.0*  HCT 35.6* 34.3* 32.4* 31.1* 32.1*  MCV 97.3 100.0 99.4 100.0 99.1  PLT 207 212 211 203 210    INR: Recent Labs  Lab 12/28/19 0238 12/29/19 0226 12/30/19 0620 12/31/19 0218 01/01/20 0207  INR 1.5* 1.5* 1.4* 1.3* 1.2    Other results:      Imaging   No results found.   Medications:     Scheduled Medications: . sodium chloride   Intravenous Once  . amiodarone  200 mg Oral Daily  . calcitonin (salmon)  1 spray Alternating Nares Daily  . carvedilol  6.25 mg Oral BID WC  . docusate sodium  100 mg Oral Daily  . feeding supplement  1 Container Oral TID BM  . gabapentin  300 mg Oral BID  . hydrALAZINE  100 mg Oral TID  . losartan  100 mg Oral Daily  . pantoprazole sodium  20 mg Oral Daily  . warfarin  2.5 mg Oral ONCE-1600  . Warfarin - Pharmacist Dosing Inpatient   Does not apply q1600    Infusions: . heparin 600  Units/hr (12/31/19 1049)    PRN Medications: acetaminophen, bupivacaine, fentaNYL (SUBLIMAZE) injection, ondansetron (ZOFRAN) IV, promethazine, traMADol    Assessment/Plan:    1) Ab pain in setting of intractable n/v due to her back pain and pain meds - CT abdomen/pelvis negative.  - Pain now resolved.   2) Low back and left hip pain in setting of L1 compression fracture and new L3 fracture - CT and bone scan with multilevel compression fx at T10, T12, L1, L3, and L4. - Underwent multilevel vertebroplasty with Dr. Corliss Skains on 4/28.  - Pain improved - PT/OT following. Walked yesterday, needs to get up again today.   3) Anorexia - Much improved - CT A/P unremarkable  4) Chronic systolic HF: NICM, s/p ICD and LVAD for DT(04/2013).  - now 6+ years out on VAD support - Typically NYHA II but now III in setting of ortho issues - Volume status improved after IVF. Low flows resolved.   5) LVAD placed for DT 04/2013:  - Failed Entresto due to diuresis and incessant low flow alarms.  - VAD interrogated personally. Parameters stable. - No ASA with GI bleed and intolerance. - LDH 200 - INR 1.2, Goal 1.8-2.3. - on low-dose heparin at 600. No bleeding. Will increase to 700 (level low).  - Continue warfarin. Discussed dosing with PharmD personally.  6) PAF:  - Did not tolerate AF well at all.  - S/p DC-CV 06/18/19 - In NSR on tele - Continue amio 200 daily.  - Restarted warfarin - No change  7) HTN:  - MAP acceptable.    8) VT - possibly due to suction event  - no further VT after IVFs - keep K> 4.0 Mg > 2.0 - Mag 2.0. K 3.7   Stop heparin and d/c home when INR 1.8 or greater   Length of Stay: 10  Marca Ancona, MD 01/01/2020, 9:55 AM  VAD Team --- VAD ISSUES ONLY--- Pager [phone] (7am - 7am)  Advanced Heart Failure Team  Pager [phone] (M-F; 7a - 4p)  Please contact [facility] for night-coverage after hours (4p -7a ) and weekends on [email or web address]

## 2020-01-01 NOTE — Progress Notes (Signed)
[place] for heparin Indication: hx LVAD  Allergies  Allergen Reactions  . Oxycodone Other (See Comments)    Hallucinations     Patient Measurements: Height: 5' (152.4 cm) Weight: 71.4 kg (157 lb 6.5 oz) IBW/kg (Calculated) : 45.5 Heparin Dosing Weight: 61.6 kg   Vital Signs: Temp: 98.1 F (36.7 C) (05/01 0827) Temp Source: Oral (05/01 0827) BP: 100/85 (05/01 0827) Pulse Rate: 70 (05/01 0827)  Labs: Recent Labs    12/30/19 0620 12/30/19 0620 12/31/19 0218 01/01/20 0207  HGB 10.1*   < > 9.5* 10.0*  HCT 32.4*  --  31.1* 32.1*  PLT 211  --  203 210  LABPROT 16.2*  --  15.6* 14.8  INR 1.4*  --  1.3* 1.2  HEPARINUNFRC <0.10*  --  <0.10* <0.10*  CREATININE 0.91  --  0.86 0.84   < > = values in this interval not displayed.    Estimated Creatinine Clearance: 44.8 mL/min (by C-G formula based on SCr of 0.84 mg/dL).   Medical History: Past Medical History:  Diagnosis Date  . AICD (automatic cardioverter/defibrillator) present   . Anemia   . Atrial fibrillation or flutter   . Cardiac arrest - ventricular fibrillation 12/10   with successful resucitation, S/p ICD  . CHF (congestive heart failure) ([place])   . Diverticula of colon 2011  . HTN (hypertension)    moderate  . ICD (implantable cardiac defibrillator) in place    she has received appropriate therapy for VF  . Internal and external hemorrhoids without complication [id number]  . Nonischemic cardiomyopathy ([place])    followed by Dr April Holding at [facility]  . Osteopenia   . Plasma cell disorder 03/20/2012  . Plasma cell disorder 03/20/2012  . Sessile colonic polyp 2011   Dr Benson [place]    Medications:  Scheduled:  . sodium chloride   Intravenous Once  . amiodarone  200 mg Oral Daily  . calcitonin (salmon)  1 spray Alternating Nares Daily  . carvedilol  6.25 mg Oral BID WC  . docusate sodium  100 mg Oral Daily  . feeding supplement  1 Container Oral TID BM  . gabapentin  300 mg Oral BID   . hydrALAZINE  100 mg Oral TID  . losartan  100 mg Oral Daily  . pantoprazole sodium  20 mg Oral Daily  . Warfarin - Pharmacist Dosing Inpatient   Does not apply q1600    Assessment: 37 yof with HM2 LVAD, presenting with nausea/vomiting due to multilevel lumbar compression fracture - awaiting vertebroplasty scheduled for 4/28.  Warfarin held and low-dose heparin started. Per discussion with MD, will check heparin level to ensure level not high but will not adjust if low. INR today 1.4. Hgb stable 10., plt stable 200s. LDH 189>249 slight bump post op - watch.  Heparin level remains undetectable on heparin drip 600 uts/hr. No current active bleed issues reported. Restart warfarin INR 1.3   PTA dose Warfarin 5mg  MF 2.5 all other days  (dose decreased PTA recently with N/V with back pain)   Goal of Therapy:  Heparin level 0.3-0.5 units/ml INR 1.8-2.3 Monitor platelets by anticoagulation protocol: Yes   Plan:  Continue low-dose heparin infusion but increase to 700 units/hr per discussion with Dr. Aundra Dubin. Check heparin level in 8 hrs to ensure not supratherapeutic. Warfarin 2.5 mg x 1 tonight.  Monitor s/s bleeding  Daily heparin level, Protime, CBC  Marguerite Olea, [facility] Clinical Pharmacist Phone [phone]  01/01/2020  9:47 AM      Please check [email or web address] for unit-specific pharmacist phone numbers

## 2020-01-01 NOTE — Progress Notes (Addendum)
Received page from beside RN regarding patient having asymptomatic low flows. Flow currently 3.4. MAPs in the 70s via cuff and doppler. Has only had 500 cc to drink today. Instructed to have pt increase PO fluid intake to closer to 2L per day. Pt has low flows at home when she does not have enough PO intake.    Per RN held morning dose of Hydralazine and Coreg for MAPs in 70s. Pt scheduled another dose of both medications this evening.   Dr Aundra Dubin aware of the above. May hold BP meds if MAPs in low 70s today.  Will give 250 cc normal saline bolus for low flows. Bedside RN made aware and verbalized understanding.  Emerson Monte RN [place] Coordinator  Office: [phone]  24/7 Pager: [phone]

## 2020-01-02 DIAGNOSIS — Z95811 Presence of heart assist device: Secondary | ICD-10-CM | POA: Diagnosis not present

## 2020-01-02 DIAGNOSIS — I5022 Chronic systolic (congestive) heart failure: Secondary | ICD-10-CM | POA: Diagnosis not present

## 2020-01-02 LAB — LACTATE DEHYDROGENASE: LDH: 200 U/L — ABNORMAL HIGH (ref 98–192)

## 2020-01-02 LAB — BASIC METABOLIC PANEL
Anion gap: 9 (ref 5–15)
BUN: 10 mg/dL (ref 8–23)
CO2: 23 mmol/L (ref 22–32)
Calcium: 8.8 mg/dL — ABNORMAL LOW (ref 8.9–10.3)
Chloride: 106 mmol/L (ref 98–111)
Creatinine, Ser: 0.84 mg/dL (ref 0.44–1.00)
GFR calc Af Amer: >60 mL/min (ref >60)
GFR calc non Af Amer: >60 mL/min (ref >60)
Glucose, Bld: 111 mg/dL — ABNORMAL HIGH (ref 70–99)
Potassium: 3.6 mmol/L (ref 3.5–5.1)
Sodium: 138 mmol/L (ref 135–145)

## 2020-01-02 LAB — HEPARIN LEVEL (UNFRACTIONATED)
Heparin Unfractionated: <0.1 IU/mL — ABNORMAL LOW (ref 0.30–0.70)
Heparin Unfractionated: 0.21 IU/mL — ABNORMAL LOW (ref 0.30–0.70)

## 2020-01-02 LAB — CBC
HCT: 32.9 % — ABNORMAL LOW (ref 36.0–46.0)
Hemoglobin: 10 g/dL — ABNORMAL LOW (ref 12.0–15.0)
MCH: 30.8 pg (ref 26.0–34.0)
MCHC: 30.4 g/dL (ref 30.0–36.0)
MCV: 101.2 fL — ABNORMAL HIGH (ref 80.0–100.0)
Platelets: 208 10*3/uL (ref 150–400)
RBC: 3.25 MIL/uL — ABNORMAL LOW (ref 3.87–5.11)
RDW: 14.9 % (ref 11.5–15.5)
WBC: 7.3 10*3/uL (ref 4.0–10.5)
nRBC: 0 % (ref 0.0–0.2)

## 2020-01-02 LAB — PROTIME-INR
INR: 1.2 (ref 0.8–1.2)
Prothrombin Time: 14.7 seconds (ref 11.4–15.2)

## 2020-01-02 MED ORDER — WARFARIN SODIUM 3 MG PO TABS
3.0000 mg | ORAL_TABLET | Freq: Once | ORAL | Status: AC
  Administered 2020-01-02: 3 mg via ORAL
  Filled 2020-01-02: qty 1

## 2020-01-02 MED ORDER — POTASSIUM CHLORIDE CRYS ER 20 MEQ PO TBCR
40.0000 meq | EXTENDED_RELEASE_TABLET | Freq: Once | ORAL | Status: AC
  Administered 2020-01-02: 40 meq via ORAL
  Filled 2020-01-02: qty 2

## 2020-01-02 MED ORDER — LOSARTAN POTASSIUM 50 MG PO TABS
50.0000 mg | ORAL_TABLET | Freq: Every day | ORAL | Status: DC
  Administered 2020-01-02 – 2020-01-07 (×6): 50 mg via ORAL
  Filled 2020-01-02 (×6): qty 1

## 2020-01-02 MED ORDER — HYDRALAZINE HCL 50 MG PO TABS
50.0000 mg | ORAL_TABLET | Freq: Three times a day (TID) | ORAL | Status: DC
  Administered 2020-01-02 – 2020-01-07 (×11): 50 mg via ORAL
  Filled 2020-01-02 (×13): qty 1

## 2020-01-02 NOTE — Plan of Care (Signed)
  Problem: Clinical Measurements: Goal: Ability to maintain clinical measurements within normal limits will improve Outcome: Progressing Goal: Will remain free from infection Outcome: Progressing Goal: Diagnostic test results will improve Outcome: Progressing Goal: Respiratory complications will improve Outcome: Progressing Goal: Cardiovascular complication will be avoided Outcome: Progressing   Problem: Activity: Goal: Risk for activity intolerance will decrease Outcome: Progressing   Problem: Nutrition: Goal: Adequate nutrition will be maintained Outcome: Progressing   Problem: Coping: Goal: Level of anxiety will decrease Outcome: Progressing   Problem: Elimination: Goal: Will not experience complications related to bowel motility Outcome: Progressing Goal: Will not experience complications related to urinary retention Outcome: Progressing   Problem: Pain Managment: Goal: General experience of comfort will improve Outcome: Progressing   Problem: Safety: Goal: Ability to remain free from injury will improve Outcome: Progressing   Problem: Skin Integrity: Goal: Risk for impaired skin integrity will decrease Outcome: Progressing   Problem: Education: Goal: Patient will be able to verbalize current INR target range and antiplatelet therapy for discharge home Outcome: Progressing

## 2020-01-02 NOTE — Progress Notes (Signed)
Brianna Hanson for heparin and warfarin Indication: hx LVAD  Patient Measurements: Height: 5' (152.4 cm) Weight: 76.6 kg (168 lb 14 oz) IBW/kg (Calculated) : 45.5 Heparin Dosing Weight: 61.6 kg   Vital Signs: Temp: 98.5 F (36.9 C) (05/02 0743) Temp Source: Oral (05/02 0743) BP: 132/85 (05/02 0743) Pulse Rate: 85 (05/02 0743)  Labs: Recent Labs    12/31/19 0218 12/31/19 0218 01/01/20 0207 01/01/20 1750 01/02/20 0301  HGB 9.5*   < > 10.0*  --  10.0*  HCT 31.1*  --  32.1*  --  32.9*  PLT 203  --  210  --  208  LABPROT 15.6*  --  14.8  --  14.7  INR 1.3*  --  1.2  --  1.2  HEPARINUNFRC <0.10*   < > <0.10* 0.14* <0.10*  CREATININE 0.86  --  0.84  --  0.84   < > = values in this interval not displayed.    Estimated Creatinine Clearance: 46.4 mL/min (by C-G formula based on SCr of 0.84 mg/dL).   Assessment: 39 yof with HM2 LVAD, presenting with nausea/vomiting due to multilevel lumbar compression fracture - s/p vertebroplasty 4/28.  Warfarin held and low-dose heparin started. Heparin level undetectable this AM.  No known issues with IV infusion, no overt bleeding or complications noted.  PTA dose Warfarin 5mg  MF 2.5 all other days  (dose decreased PTA recently with N/V with back pain)   Goal of Therapy:  Heparin level 0.3-0.5 units/ml INR 1.8-2.3 Monitor platelets by anticoagulation protocol: Yes   Plan:  Increase IV heparin to 800 units/hr.  Per discussion with Dr. Aundra Dubin, will not titrate over 800 units/hr. Check heparin level in 8 hrs to ensure not elevated. Monitor s/s bleeding  Daily heparin level, INR, CBC  Marguerite Olea, [facility] Clinical Pharmacist Phone [phone]  01/02/2020 9:07 AM

## 2020-01-02 NOTE — Progress Notes (Signed)
Patient ID: Brianna Hanson, female   DOB: 1935/05/06, 85 y.o.   MRN: [id number]   Advanced Heart Failure VAD Team Note  PCP-Cardiologist: No primary care provider on file.   Subjective:   85 y/o female w/ NICM, s/p ICD and LVAD for DT(04/2013), Admitted with N/V, abd pain due to multilevel lumbar compression fractures.   S/p multi-level vertebroplasty on 4/28. Back pain improved considerably.  Walked in room yesterday. Appetite good. On heparin.  INR still 1.2, on heparin gtt. Hgb 10.1 -> 9.5 -> 10 -> 10.  No overt GI bleeding.  Had multiple low flows yesterday, had several doses of BP meds held with MAP around 70.  This morning, MAP 80s and no low flows so far.   LDH 200.   LVAD INTERROGATION:  HeartMate II LVAD:   Flow 3.5  liters/min, speed 8600, power 3.9  PI 7.  Several low flow alarms yesterday, last at 2 am.   VAD interrogated personally.     Objective:    Vital Signs:   Temp:  [98.1 F (36.7 C)-98.5 F (36.9 C)] 98.5 F (36.9 C) (05/02 0743) Pulse Rate:  [70-85] 85 (05/02 0743) Resp:  [14-22] 14 (05/02 0743) BP: (88-132)/(56-85) 132/85 (05/02 0743) SpO2:  [96 %-100 %] 98 % (05/02 0743) Weight:  [76.6 kg] 76.6 kg (05/02 0400) Last BM Date: 01/01/20 Mean arterial Pressure 80s currently   Intake/Output:   Intake/Output Summary (Last 24 hours) at 01/02/2020 0934 Last data filed at 01/02/2020 0400 Gross per 24 hour  Intake 1798.71 ml  Output 1475 ml  Net 323.71 ml     Physical Exam   General: Well appearing this am. NAD.  HEENT: Normal. Neck: Supple, JVP 7-8 cm. Carotids OK.  Cardiac:  Mechanical heart sounds with LVAD hum present.  Lungs:  CTAB, normal effort.  Abdomen:  NT, ND, no HSM. No bruits or masses. +BS  LVAD exit site: Well-healed and incorporated. Dressing dry and intact. No erythema or drainage. Stabilization device present and accurately applied. Driveline dressing changed daily per sterile technique. Extremities:  Warm and dry. No cyanosis, clubbing,  rash, or edema.  Neuro:  Alert & oriented x 3. Cranial nerves grossly intact. Moves all 4 extremities w/o difficulty. Affect pleasant    Telemetry   NSR 80s Personally reviewed   Labs   Basic Metabolic Panel: Recent Labs  Lab 12/27/19 0043 12/27/19 0043 12/28/19 0238 12/28/19 0238 12/29/19 0226 12/29/19 0226 12/30/19 [id number] 12/30/19 0620 12/31/19 0218 01/01/20 0207 01/02/20 0301  NA 135   < > 134*   < > 140  --  141  --  140 142 138  K 3.9   < > 3.0*   < > 3.7  --  3.9  --  3.6 4.0 3.6  CL 104   < > 103   < > 108  --  109  --  108 109 106  CO2 22   < > 21*   < > 24  --  23  --  23 24 23   GLUCOSE 115*   < > 97   < > 91  --  94  --  99 103* 111*  BUN <5*   < > <5*   < > 5*  --  9  --  8 9 10   CREATININE 0.83   < > 0.89   < > 0.83  --  0.91  --  0.86 0.84 0.84  CALCIUM 8.5*   < > 8.4*   < >  8.7*   < > 9.1   < > 8.8* 8.7* 8.8*  MG 1.5*  --  2.3  --  2.0  --  1.8  --   --   --   --    < > = values in this interval not displayed.    Liver Function Tests: No results for input(s): AST, ALT, ALKPHOS, BILITOT, PROT, ALBUMIN in the last 168 hours. No results for input(s): LIPASE, AMYLASE in the last 168 hours. No results for input(s): AMMONIA in the last 168 hours.  CBC: Recent Labs  Lab 12/29/19 0226 12/30/19 0620 12/31/19 0218 01/01/20 0207 01/02/20 0301  WBC 7.5 8.4 6.7 6.8 7.3  HGB 10.6* 10.1* 9.5* 10.0* 10.0*  HCT 34.3* 32.4* 31.1* 32.1* 32.9*  MCV 100.0 99.4 100.0 99.1 101.2*  PLT 212 211 203 210 208    INR: Recent Labs  Lab 12/29/19 0226 12/30/19 0620 12/31/19 0218 01/01/20 0207 01/02/20 0301  INR 1.5* 1.4* 1.3* 1.2 1.2    Other results:      Imaging   No results found.   Medications:     Scheduled Medications: . sodium chloride   Intravenous Once  . amiodarone  200 mg Oral Daily  . calcitonin (salmon)  1 spray Alternating Nares Daily  . carvedilol  6.25 mg Oral BID WC  . docusate sodium  100 mg Oral Daily  . feeding supplement  1  Container Oral TID BM  . gabapentin  300 mg Oral BID  . hydrALAZINE  50 mg Oral TID  . losartan  100 mg Oral Daily  . pantoprazole sodium  20 mg Oral Daily  . potassium chloride  40 mEq Oral Once  . warfarin  3 mg Oral ONCE-1600  . Warfarin - Pharmacist Dosing Inpatient   Does not apply q1600    Infusions: . heparin 800 Units/hr (01/02/20 0908)    PRN Medications: acetaminophen, bupivacaine, fentaNYL (SUBLIMAZE) injection, ondansetron (ZOFRAN) IV, promethazine, traMADol    Assessment/Plan:    1) Ab pain in setting of intractable n/v due to her back pain and pain meds - CT abdomen/pelvis negative.  - Pain now resolved.   2) Low back and left hip pain in setting of L1 compression fracture and new L3 fracture - CT and bone scan with multilevel compression fx at T10, T12, L1, L3, and L4. - Underwent multilevel vertebroplasty with Dr. Estanislado Pandy on 4/28.  - Pain improved - PT/OT following. Walked in room yesterday, needs to get up again today.   3) Anorexia - Much improved - CT A/P unremarkable  4) Chronic systolic HF: NICM, s/p ICD and LVAD for DT(04/2013).  - now 6+ years out on VAD support - Typically NYHA II but now III in setting of ortho issues - Low flows yesterday, MAP around 70.  Got IV fluid and BP meds held, no low flows yet this morning with MAP 80s.  Will have her push po intake today, decrease hydralazine to 50 mg tid for now and losartan to 50 daily.  She says she gets low flows not uncommonly at home.   5) LVAD placed for DT 04/2013:  - Failed Entresto due to diuresis and incessant low flow alarms.  - VAD interrogated personally. Parameters stable. - No ASA with GI bleed and intolerance. - LDH 200 - INR 1.2, Goal 1.8-2.3. - on low-dose heparin at 700. No bleeding. Will increase to 800 (level low).  - Continue warfarin. Discussed dosing with PharmD personally.  6) PAF:  -  Did not tolerate AF well at all.  - S/p DC-CV 06/18/19 - In NSR on tele - Continue  amio 200 daily.  - Restarted warfarin - No change  7) HTN:  - See above changes to BP-active meds.    8) VT - possibly due to suction event  - no further VT after IVFs - keep K> 4.0 Mg > 2.0 - Mag 2.0. K 3.7   Stop heparin and d/c home when INR 1.8 or greater   Length of Stay: 68  Loralie Champagne, MD 01/02/2020, 9:34 AM  VAD Team --- VAD ISSUES ONLY--- Pager [phone] ([place])  Advanced Heart Failure Team  Pager [phone] (M-F; 7a - 4p)  Please contact [facility] for night-coverage after hours (4p -7a ) and weekends on [email or web address]

## 2020-01-02 NOTE — Plan of Care (Signed)

## 2020-01-02 NOTE — Progress Notes (Signed)
[place] for heparin and warfarin Indication: hx LVAD  Patient Measurements: Height: 5' (152.4 cm) Weight: 76.6 kg (168 lb 14 oz) IBW/kg (Calculated) : 45.5 Heparin Dosing Weight: 61.6 kg   Vital Signs: Temp: 98.6 F (37 C) (05/02 1714) Temp Source: Oral (05/02 1714) BP: 93/72 (05/02 1714) Pulse Rate: 139 (05/02 1714)  Labs: Recent Labs    12/31/19 0218 12/31/19 0218 01/01/20 0207 01/01/20 0207 01/01/20 1750 01/02/20 0301 01/02/20 1644  HGB 9.5*   < > 10.0*  --   --  10.0*  --   HCT 31.1*  --  32.1*  --   --  32.9*  --   PLT 203  --  210  --   --  208  --   LABPROT 15.6*  --  14.8  --   --  14.7  --   INR 1.3*  --  1.2  --   --  1.2  --   HEPARINUNFRC <0.10*   < > <0.10*   < > 0.14* <0.10* 0.21*  CREATININE 0.86  --  0.84  --   --  0.84  --    < > = values in this interval not displayed.    Estimated Creatinine Clearance: 46.4 mL/min (by C-G formula based on SCr of 0.84 mg/dL).   Assessment: 82 yof with HM2 LVAD, presenting with nausea/vomiting due to multilevel lumbar compression fracture - s/p vertebroplasty 4/28. Warfarin held and low-dose heparin started.  Heparin level 0.21 - will not increase rate per AM discussion with MD. H/H and plt stable.   PTA dose Warfarin 5mg  MF 2.5 all other days  (dose decreased PTA recently with N/V with back pain)   Goal of Therapy:  Heparin level 0.3-0.5 units/ml INR 1.8-2.3 Monitor platelets by anticoagulation protocol: Yes   Plan:  Continue IV heparin to 800 units/hr.  Per discussion with Dr. Aundra Dubin, will not titrate over 800 units/hr. Monitor s/s bleeding  Daily heparin level, INR, CBC  Benetta Spar, PharmD, BCPS, BCCP Clinical Pharmacist  Please check AMION for all [place] phone numbers After 10:00 PM, call [place] [phone]

## 2020-01-02 NOTE — Progress Notes (Signed)
States pain is better after multiple kyphoplasty. Did not do PT yesterday.

## 2020-01-03 DIAGNOSIS — S32000A Wedge compression fracture of unspecified lumbar vertebra, initial encounter for closed fracture: Secondary | ICD-10-CM | POA: Diagnosis not present

## 2020-01-03 DIAGNOSIS — Z95811 Presence of heart assist device: Secondary | ICD-10-CM | POA: Diagnosis not present

## 2020-01-03 DIAGNOSIS — I5022 Chronic systolic (congestive) heart failure: Secondary | ICD-10-CM | POA: Diagnosis not present

## 2020-01-03 LAB — HEPARIN LEVEL (UNFRACTIONATED): Heparin Unfractionated: 0.2 IU/mL — ABNORMAL LOW (ref 0.30–0.70)

## 2020-01-03 LAB — BASIC METABOLIC PANEL
Anion gap: 8 (ref 5–15)
BUN: 8 mg/dL (ref 8–23)
CO2: 25 mmol/L (ref 22–32)
Calcium: 8.9 mg/dL (ref 8.9–10.3)
Chloride: 107 mmol/L (ref 98–111)
Creatinine, Ser: 0.86 mg/dL (ref 0.44–1.00)
GFR calc Af Amer: >60 mL/min (ref >60)
GFR calc non Af Amer: >60 mL/min (ref >60)
Glucose, Bld: 114 mg/dL — ABNORMAL HIGH (ref 70–99)
Potassium: 3.3 mmol/L — ABNORMAL LOW (ref 3.5–5.1)
Sodium: 140 mmol/L (ref 135–145)

## 2020-01-03 LAB — CBC
HCT: 32.5 % — ABNORMAL LOW (ref 36.0–46.0)
Hemoglobin: 10 g/dL — ABNORMAL LOW (ref 12.0–15.0)
MCH: 30.9 pg (ref 26.0–34.0)
MCHC: 30.8 g/dL (ref 30.0–36.0)
MCV: 100.3 fL — ABNORMAL HIGH (ref 80.0–100.0)
Platelets: 197 10*3/uL (ref 150–400)
RBC: 3.24 MIL/uL — ABNORMAL LOW (ref 3.87–5.11)
RDW: 15.1 % (ref 11.5–15.5)
WBC: 7.2 10*3/uL (ref 4.0–10.5)
nRBC: 0 % (ref 0.0–0.2)

## 2020-01-03 LAB — PROTIME-INR
INR: 1.2 (ref 0.8–1.2)
Prothrombin Time: 14.6 seconds (ref 11.4–15.2)

## 2020-01-03 LAB — MAGNESIUM: Magnesium: 1.7 mg/dL (ref 1.7–2.4)

## 2020-01-03 LAB — LACTATE DEHYDROGENASE: LDH: 184 U/L (ref 98–192)

## 2020-01-03 MED ORDER — POLYETHYLENE GLYCOL 3350 17 G PO PACK: 17.0000 g | PACK | Freq: Every day | ORAL | Status: DC | PRN

## 2020-01-03 MED ORDER — POTASSIUM CHLORIDE CRYS ER 20 MEQ PO TBCR
40.0000 meq | EXTENDED_RELEASE_TABLET | Freq: Once | ORAL | Status: AC
  Administered 2020-01-03: 40 meq via ORAL
  Filled 2020-01-03: qty 2

## 2020-01-03 MED ORDER — WARFARIN SODIUM 3 MG PO TABS
3.0000 mg | ORAL_TABLET | Freq: Once | ORAL | Status: AC
  Administered 2020-01-03: 3 mg via ORAL
  Filled 2020-01-03: qty 1

## 2020-01-03 NOTE — Progress Notes (Addendum)
Patient ID: Brianna Hanson, female   DOB: 1934/11/10, 85 y.o.   MRN: [id number]   Advanced Heart Failure VAD Team Note  PCP-Cardiologist: No primary care provider on file.   Subjective:   85 y/o female w/ NICM, s/p ICD and LVAD for DT(04/2013), Admitted with N/V, abd pain due to multilevel lumbar compression fractures.   S/p multi-level vertebroplasty on 4/28.    INR 1.2 , on heparin gtt. Hgb 10, stable.   Yesterday hydralazine was cut back to 50 mg tid due to low flows. No lows overnight.   LDH 184    Feeling better. A little sore but says its not bad. Denies SOB. Complaining of constipation.   LVAD INTERROGATION:  HeartMate II LVAD:  Flow 3.6  liters/min, speed 8600, power 4  PI 6.6 , No low flows noted.    VAD interrogated personally.     Objective:    Vital Signs:   Temp:  [97.7 F (36.5 C)-98.6 F (37 C)] 98 F (36.7 C) (05/03 0600) Pulse Rate:  [73-139] 78 (05/03 0600) Resp:  [14-24] 16 (05/03 0600) BP: (93-132)/(71-85) 94/80 (05/03 0600) SpO2:  [98 %-100 %] 100 % (05/03 0600) Weight:  [76.6 kg] 76.6 kg (05/03 0624) Last BM Date: 01/01/20 Mean arterial Pressure 80s   Intake/Output:   Intake/Output Summary (Last 24 hours) at 01/03/2020 0723 Last data filed at 01/03/2020 0600 Gross per 24 hour  Intake 1437.75 ml  Output 2000 ml  Net -562.25 ml     Physical Exam  Physical Exam: GENERAL: No acute distress. In bed.  HEENT: normal  NECK: Supple, JVP flat   .  2+ bilaterally, no bruits.  No lymphadenopathy or thyromegaly appreciated.   CARDIAC:  Mechanical heart sounds with LVAD hum present.  LUNGS:  Clear to auscultation bilaterally.  ABDOMEN:  Soft, round, nontender, positive bowel sounds x4.     LVAD exit site: well-healed and incorporated.  Dressing dry and intact.  No erythema or drainage.  Stabilization device present and accurately applied.  Driveline dressing is being changed daily per sterile technique. EXTREMITIES:  Warm and dry, no cyanosis, clubbing, rash  or edema  NEUROLOGIC:  Alert and oriented x 3  No aphasia.  No dysarthria.  Affect pleasant.       Telemetry  NSR 70-80s personally reviewed.    Labs   Basic Metabolic Panel: Recent Labs  Lab 12/28/19 0238 12/28/19 [id number] 12/29/19 0226 12/29/19 0226 12/30/19 [id number] 12/30/19 [id number] 12/31/19 [id number] 12/31/19 0218 01/01/20 0207 01/02/20 0301 01/03/20 0246  NA 134*   < > 140   < > 141  --  140  --  142 138 140  K 3.0*   < > 3.7   < > 3.9  --  3.6  --  4.0 3.6 3.3*  CL 103   < > 108   < > 109  --  108  --  109 106 107  CO2 21*   < > 24   < > 23  --  23  --  24 23 25   GLUCOSE 97   < > 91   < > 94  --  99  --  103* 111* 114*  BUN <5*   < > 5*   < > 9  --  8  --  9 10 8   CREATININE 0.89   < > 0.83   < > 0.91  --  0.86  --  0.84 0.84 0.86  CALCIUM 8.4*   < >  8.7*   < > 9.1   < > 8.8*   < > 8.7* 8.8* 8.9  MG 2.3  --  2.0  --  1.8  --   --   --   --   --  1.7   < > = values in this interval not displayed.    Liver Function Tests: No results for input(s): AST, ALT, ALKPHOS, BILITOT, PROT, ALBUMIN in the last 168 hours. No results for input(s): LIPASE, AMYLASE in the last 168 hours. No results for input(s): AMMONIA in the last 168 hours.  CBC: Recent Labs  Lab 12/30/19 0620 12/31/19 0218 01/01/20 0207 01/02/20 0301 01/03/20 0246  WBC 8.4 6.7 6.8 7.3 7.2  HGB 10.1* 9.5* 10.0* 10.0* 10.0*  HCT 32.4* 31.1* 32.1* 32.9* 32.5*  MCV 99.4 100.0 99.1 101.2* 100.3*  PLT 211 203 210 208 197    INR: Recent Labs  Lab 12/30/19 0620 12/31/19 0218 01/01/20 0207 01/02/20 0301 01/03/20 0246  INR 1.4* 1.3* 1.2 1.2 1.2    Other results:      Imaging   No results found.   Medications:     Scheduled Medications: . sodium chloride   Intravenous Once  . amiodarone  200 mg Oral Daily  . calcitonin (salmon)  1 spray Alternating Nares Daily  . carvedilol  6.25 mg Oral BID WC  . docusate sodium  100 mg Oral Daily  . feeding supplement  1 Container Oral TID BM  . gabapentin  300  mg Oral BID  . hydrALAZINE  50 mg Oral TID  . losartan  50 mg Oral Daily  . pantoprazole sodium  20 mg Oral Daily  . Warfarin - Pharmacist Dosing Inpatient   Does not apply q1600    Infusions: . heparin 800 Units/hr (01/03/20 0600)    PRN Medications: acetaminophen, bupivacaine, fentaNYL (SUBLIMAZE) injection, ondansetron (ZOFRAN) IV, promethazine, traMADol    Assessment/Plan:    1) Ab pain in setting of intractable n/v due to her back pain and pain meds - CT abdomen/pelvis negative.  - Pain now resolved.   2) Low back and left hip pain in setting of L1 compression fracture and new L3 fracture - CT and bone scan with multilevel compression fx at T10, T12, L1, L3, and L4. - Underwent multilevel vertebroplasty with Dr. Estanislado Pandy on 4/28.  - Pain improved - PT/OT following.  - Needs to walk more today. PT to see today.    3) Anorexia - Much improved - CT A/P unremarkable  4) Chronic systolic HF: NICM, s/p ICD and LVAD for DT(04/2013).  - now 6+ years out on VAD support - Typically NYHA II but now III in setting of ortho issues - Maps stable. Continue hydralazine to 50 mg tid for now and losartan to 50 daily.    5) LVAD placed for DT 04/2013:  - Failed Entresto due to diuresis and incessant low flow alarms.  - VAD interrogated personally. Parameters stable. - No ASA with GI bleed and intolerance. - LDH 184  - INR 1.2 unchanged x3 days. , Goal 1.8-2.3. - Remains on heparin drip.  - Continue warfarin. Discussed dosing with PharmD personally.  6) PAF:  - Did not tolerate AF well at all.  - S/p DC-CV 06/18/19 - Maintaining NSR>  - Continue amio 200 daily.  - Restarted warfarin - No change  7) HTN:  - See above changes to BP-active meds.    8) VT - possibly due to suction event  - no  further VT after IVFs - keep K> 4.0 Mg > 2.0 - Mag 2.0. K 3.7   Ambulate today. Give miralax. Supp K.  Home when INR 1.8    Length of Stay: [place], NP 01/03/2020,  7:23 AM  VAD Team --- VAD ISSUES ONLY--- Pager [phone] (7am - 7am)  Advanced Heart Failure Team  Pager [phone] (M-F; 7a - 4p)  Please contact [facility] for night-coverage after hours (4p -7a ) and weekends on [email or web address]  Patient seen and examined with the above-signed Advanced Practice Provider and/or Housestaff. I personally reviewed laboratory data, imaging studies and relevant notes. I independently examined the patient and formulated the important aspects of the plan. I have edited the note to reflect any of my changes or salient points. I have personally discussed the plan with the patient and/or family.  Back pain improved. Feeling better. Appetite is good. Working with PT. MAPs improved. Remains on heparin gtt. No bleeding. INR unchanged at 1.2.   General:  NAD.  HEENT: normal  Neck: supple. JVP not elevated.  Carotids 2+ bilat; no bruits. No lymphadenopathy or thryomegaly appreciated. Cor: LVAD hum.  Lungs: Clear. Abdomen: soft, nontender, non-distended. No hepatosplenomegaly. No bruits or masses. Good bowel sounds. Driveline site clean. Anchor in place.  Extremities: no cyanosis, clubbing, rash. Warm no edema  Neuro: alert & oriented x 3. No focal deficits. Moves all 4 without problem   Improved. Remains on heparin gtt. INR 1.2. Discussed dosing with PharmD personally.VAD interrogated personally. Parameters stable.  Glori Bickers, MD  1:50 PM

## 2020-01-03 NOTE — Progress Notes (Signed)
Occupational Therapy Treatment Patient Details Name: Brianna Hanson MRN: [id number] DOB: 01-08-1935 Today's Date: 01/03/2020    History of present illness 85 yo admitted with N/V due to pain from back with T12, L1, L3, L4 compression fx. PMHx: HF, NICM, PAF, plasma cell disorder, LVAD heartmate II 04/06/13, lap chole   OT comments  Pt had recently returned to bed after 10 minutes in chair. Unable to tolerate longer due to pain in back and hips. Pt agreeable to sitting EOB for education in LB ADL using AE. Continued education in back precautions for comfort during bed mobility, pt continues to need increased time and verbal cues. Pt is hopeful to go home on Wednesday. Educated in importance of frequent mobility at home. Pt verbalizing understanding.   Follow Up Recommendations  Home health OT;Supervision/Assistance - 24 hour    Equipment Recommendations  None recommended by OT    Recommendations for Other Services      Precautions / Restrictions Precautions Precautions: Back;Fall(back precautions for comfort) Precaution Comments: reinforced back precautions for comfort       Mobility Bed Mobility Overal bed mobility: Needs Assistance Bed Mobility: Rolling;Sidelying to Sit;Sit to Sidelying Rolling: Supervision Sidelying to sit: Supervision     Sit to sidelying: Min assist General bed mobility comments: cues for log roll technique, increased time with use of rail  Transfers Overall transfer level: Needs assistance Equipment used: Rolling walker (2 wheeled) Transfers: Sit to/from [facility] Sit to Stand: Min assist Stand pivot transfers: Min assist       General transfer comment: assist to rise    Balance Overall balance assessment: Needs assistance   Sitting balance-Leahy Scale: Fair     Standing balance support: Bilateral upper extremity supported Standing balance-Leahy Scale: Poor                             ADL either performed or  assessed with clinical judgement   ADL Overall ADL's : Needs assistance/impaired               Lower Body Bathing Details (indicate cue type and reason): issued long handled bath sponge and reacher for washing/drying feet     Lower Body Dressing: Minimal assistance;Sit to/from stand;With adaptive equipment Lower Body Dressing Details (indicate cue type and reason): issued reacher, sock aid and long handled shoe horn, pt wears slip on shoes Toilet Transfer: Minimal assistance;Stand-pivot;BSC   Toileting- Clothing Manipulation and Hygiene: Minimal assistance;Sit to/from stand               Vision       Perception     Praxis      Cognition Arousal/Alertness: Awake/alert Behavior During Therapy: WFL for tasks assessed/performed Overall Cognitive Status: Impaired/Different from baseline Area of Impairment: Memory                     Memory: Decreased recall of precautions                  Exercises     Shoulder Instructions       General Comments      Pertinent Vitals/ Pain       Pain Assessment: Faces Faces Pain Scale: Hurts whole lot Pain Location: 0 at rest, 8 with sitting Pain Descriptors / Indicators: Aching;Sore Pain Intervention(s): Premedicated before session;Repositioned  Home Living  Prior Functioning/Environment              Frequency  Min 2X/week        Progress Toward Goals  OT Goals(current goals can now be found in the care plan section)  Progress towards OT goals: Progressing toward goals  Acute Rehab OT Goals Patient Stated Goal: be able to walk and get rid of back pain OT Goal Formulation: With patient Time For Goal Achievement: 01/06/20 Potential to Achieve Goals: Good  Plan Discharge plan remains appropriate    Co-evaluation                 AM-PAC OT "6 Clicks" Daily Activity     Outcome Measure   Help from another person eating  meals?: None Help from another person taking care of personal grooming?: A Little Help from another person toileting, which includes using toliet, bedpan, or urinal?: A Little Help from another person bathing (including washing, rinsing, drying)?: A Little Help from another person to put on and taking off regular upper body clothing?: A Little Help from another person to put on and taking off regular lower body clothing?: A Little 6 Click Score: 19    End of Session Equipment Utilized During Treatment: Rolling walker  OT Visit Diagnosis: Unsteadiness on feet (R26.81);Other abnormalities of gait and mobility (R26.89);Muscle weakness (generalized) (M62.81);Pain   Activity Tolerance Patient limited by pain   Patient Left in bed;with call bell/phone within reach;with bed alarm set   Nurse Communication Mobility status        Time: [id number] OT Time Calculation (min): 17 min  Charges: OT General Charges $OT Visit: 1 Visit OT Treatments $Self Care/Home Management : 8-22 mins  Nestor Lewandowsky, OTR/L Acute Rehabilitation Services Pager: [phone] Office: [phone]   Brianna Hanson 01/03/2020, 3:21 PM

## 2020-01-03 NOTE — Progress Notes (Signed)
LVAD Coordinator Rounding Note:  Admitted to Dr. Clayborne Dana service on 12/22/19 due to abdominal pain with refractory nausea and vomiting due to lumbar compression fracture. [name]   HM II LVAD implanted on 04/06/13 by Dr. Cyndia Bent under Destination Therapy criteria due to advanced age.   Pt lying in bed. States that she feels ok, does have pain when she moves around but pt states that she does not want anything for pain management. Pt has not been up today.  Pt had multiple LF 5/1-5/2 Hydralazine decreased yesterday.  Vital signs: Temp:  98.1 HR: 80 Doppler Pressure: 108 Automatic BP: 120/76 (89) O2 Sat: 100% RA Wt: 164.9>162.7>153.4>165.1>159.8>155.2>165.7>158.2>168.8 lbs   LVAD interrogation reveals:  Speed:  8600 Flow:  3.4 Power:  4.1w PI: 6.9 Alarms: none last 24 hrs; multiple LF 5/1-5/2 Events: 1 event today  Fixed speed: 8600 Low speed limit: 8000  Drive Line:  C/D/I with anchor intact and accurately applied. Weekly dressing changes per bedside nurse or patient's husband. Next dressing change due 01/06/20.   Labs:  LDH trend: 203>219>202>179>177>189>249>222>184  INR trend: 3.6>3.1>2.8>2.0>1.5>1.5>1.4>1.3>1.2  Anticoagulation Plan: -INR Goal: 2.0 - 2.5 - holding warfarin  -ASA Dose: none (intolerance)  -Heparin 800 u/hr  Device:  St Jude dual ICD - Pacing: DDD 60 -Therapies: on 200   Plan/Recommendations:  1. Call VAD Coordinator for any VAD equipment or drive line issues. 2. Weekly LVAD drive line dressing changes per bedside nurse or patient's husband.  Tanda Rockers RN [place] Coordinator  Office: [phone]  24/7 Pager: [phone]

## 2020-01-03 NOTE — Progress Notes (Signed)
Paged Sarah LVAD coordinator with update on patients soft BP 82/62 (68) doppler 90 which maybe a modified systolic pressure. Flow is ok at 3.3 other reading good as well. Discussed plan with Sarah to hold night dose hydralazine tonight and in the morning. MD will review in the morning for adjustments. Judson Roch agreed that if pressure continue to drop that I may give NS bolus of 250 ml. I will continue to monitor. Patient asymptomatic.

## 2020-01-03 NOTE — Progress Notes (Signed)
[name] for heparin and warfarin Indication: hx LVAD  Patient Measurements: Height: 5' (152.4 cm) Weight: 76.6 kg (168 lb 14 oz) IBW/kg (Calculated) : 45.5 Heparin Dosing Weight: 61.6 kg   Vital Signs: Temp: 98 F (36.7 C) (05/03 0751) Temp Source: Oral (05/03 0751) BP: 120/76 (05/03 0751) Pulse Rate: 71 (05/03 0751)  Labs: Recent Labs    01/01/20 0207 01/01/20 1750 01/02/20 0301 01/02/20 1644 01/03/20 0246  HGB 10.0*  --  10.0*  --  10.0*  HCT 32.1*  --  32.9*  --  32.5*  PLT 210  --  208  --  197  LABPROT 14.8  --  14.7  --  14.6  INR 1.2  --  1.2  --  1.2  HEPARINUNFRC <0.10*   < > <0.10* 0.21* 0.20*  CREATININE 0.84  --  0.84  --  0.86   < > = values in this interval not displayed.    Estimated Creatinine Clearance: 45.3 mL/min (by C-G formula based on SCr of 0.86 mg/dL).   Assessment: 64 yof with HM2 LVAD, presenting with nausea/vomiting due to multilevel lumbar compression fracture - s/p vertebroplasty 4/28. Warfarin held and low-dose heparin started.  Heparin level 0.20 - will not increase rate per AM discussion with MD. CBC stable. INR unchanged at 1.2.   PTA dose Warfarin 5mg  MF 2.5 all other days  (dose decreased PTA recently with N/V with back pain)   Goal of Therapy:  Heparin level 0.3-0.5 units/ml INR 1.8-2.3 Monitor platelets by anticoagulation protocol: Yes   Plan:  Continue IV heparin to 800 units/hr.  Per discussion with HF team will not increase over 800 units/hr Warfarin 3mg  again tonight Monitor s/s bleeding  Daily heparin level, INR, CBC  Erin Hearing PharmD., BCPS Clinical Pharmacist 01/03/2020 9:38 AM  Please check AMION for all [facility] phone numbers After 10:00 PM, call [place] [phone]

## 2020-01-04 DIAGNOSIS — I5022 Chronic systolic (congestive) heart failure: Secondary | ICD-10-CM | POA: Diagnosis not present

## 2020-01-04 DIAGNOSIS — S32000A Wedge compression fracture of unspecified lumbar vertebra, initial encounter for closed fracture: Secondary | ICD-10-CM | POA: Diagnosis not present

## 2020-01-04 DIAGNOSIS — Z95811 Presence of heart assist device: Secondary | ICD-10-CM | POA: Diagnosis not present

## 2020-01-04 DIAGNOSIS — R112 Nausea with vomiting, unspecified: Secondary | ICD-10-CM | POA: Diagnosis not present

## 2020-01-04 LAB — BASIC METABOLIC PANEL
Anion gap: 9 (ref 5–15)
BUN: 9 mg/dL (ref 8–23)
CO2: 22 mmol/L (ref 22–32)
Calcium: 8.9 mg/dL (ref 8.9–10.3)
Chloride: 107 mmol/L (ref 98–111)
Creatinine, Ser: 0.73 mg/dL (ref 0.44–1.00)
GFR calc Af Amer: >60 mL/min (ref >60)
GFR calc non Af Amer: >60 mL/min (ref >60)
Glucose, Bld: 89 mg/dL (ref 70–99)
Potassium: 3.8 mmol/L (ref 3.5–5.1)
Sodium: 138 mmol/L (ref 135–145)

## 2020-01-04 LAB — CBC
HCT: 31.6 % — ABNORMAL LOW (ref 36.0–46.0)
Hemoglobin: 9.7 g/dL — ABNORMAL LOW (ref 12.0–15.0)
MCH: 31 pg (ref 26.0–34.0)
MCHC: 30.7 g/dL (ref 30.0–36.0)
MCV: 101 fL — ABNORMAL HIGH (ref 80.0–100.0)
Platelets: 217 10*3/uL (ref 150–400)
RBC: 3.13 MIL/uL — ABNORMAL LOW (ref 3.87–5.11)
RDW: 15.3 % (ref 11.5–15.5)
WBC: 7.8 10*3/uL (ref 4.0–10.5)
nRBC: 0 % (ref 0.0–0.2)

## 2020-01-04 LAB — PROTIME-INR
INR: 1.2 (ref 0.8–1.2)
Prothrombin Time: 14.7 seconds (ref 11.4–15.2)

## 2020-01-04 LAB — HEPARIN LEVEL (UNFRACTIONATED): Heparin Unfractionated: 0.22 IU/mL — ABNORMAL LOW (ref 0.30–0.70)

## 2020-01-04 LAB — LACTATE DEHYDROGENASE: LDH: 182 U/L (ref 98–192)

## 2020-01-04 MED ORDER — WARFARIN SODIUM 4 MG PO TABS
4.0000 mg | ORAL_TABLET | Freq: Once | ORAL | Status: AC
  Administered 2020-01-04: 4 mg via ORAL
  Filled 2020-01-04: qty 1

## 2020-01-04 NOTE — Progress Notes (Addendum)
Patient ID: ANNALAURA KUKLA, female   DOB: February 07, 1935, 85 y.o.   MRN: [id number]   Advanced Heart Failure VAD Team Note  PCP-Cardiologist: No primary care provider on file.   Subjective:   85 y/o female w/ NICM, s/p ICD and LVAD for DT(04/2013), Admitted with N/V, abd pain due to multilevel lumbar compression fractures.   S/p multi-level vertebroplasty on 4/28.    INR 1.2 , on heparin gtt. Hgb 10, stable.   LDH 182   Last night hydralazine was held-->Per nursing staff.BP 82/62 (68) doppler 90 which maybe a modified systolic pressure.     Had BM 01/03/20  Complaining of stomach ache. Yesterday she was able to sit up for about 10 minutes but then she was in pain. Says her hips hurt but not her back. Drinking 2 supplements a day.    LVAD INTERROGATION:  HeartMate II LVAD:  Flow 3.6   liters/min, speed 8600, power 4  PI 7.1  , No low flows noted. Rare PI events.    VAD interrogated personally.     Objective:    Vital Signs:   Temp:  [98 F (36.7 C)-98.2 F (36.8 C)] 98.2 F (36.8 C) (05/04 0702) Pulse Rate:  [65-80] 73 (05/04 0702) Resp:  [13-23] 20 (05/04 0702) BP: (82-113)/(62-84) 113/69 (05/04 0702) SpO2:  [95 %-99 %] 97 % (05/04 0702) Weight:  [72.8 kg] 72.8 kg (05/04 0532) Last BM Date: 01/01/20 Mean arterial Pressure 80s   Intake/Output:   Intake/Output Summary (Last 24 hours) at 01/04/2020 0826 Last data filed at 01/04/2020 0533 Gross per 24 hour  Intake 360 ml  Output 430 ml  Net -70 ml     Physical Exam   Physical Exam: GENERAL: In bed. No acute distress. HEENT: normal  NECK: Supple, JVP flat   .  2+ bilaterally, no bruits.  No lymphadenopathy or thyromegaly appreciated.   CARDIAC:  Mechanical heart sounds with LVAD hum present.  LUNGS:  Clear to auscultation bilaterally.  ABDOMEN:  Soft, round, nontender, positive bowel sounds x4.     LVAD exit site: well-healed and incorporated.  Dressing dry and intact.  No erythema or drainage.  Stabilization device present  and accurately applied.  Driveline dressing is being changed daily per sterile technique. EXTREMITIES:  Warm and dry, no cyanosis, clubbing, rash or edema  NEUROLOGIC:  Alert and oriented x 3.  No aphasia.  No dysarthria.  Affect pleasant.        Telemetry  NSR 70-80s personally reviewed.    Labs   Basic Metabolic Panel: Recent Labs  Lab 12/29/19 0226 12/29/19 [id number] 12/30/19 [id number] 12/30/19 [id number] 12/31/19 [id number] 12/31/19 [id number] 01/01/20 0207 01/01/20 0207 01/02/20 0301 01/03/20 0246 01/04/20 0306  NA 140   < > 141   < > 140  --  142  --  138 140 138  K 3.7   < > 3.9   < > 3.6  --  4.0  --  3.6 3.3* 3.8  CL 108   < > 109   < > 108  --  109  --  106 107 107  CO2 24   < > 23   < > 23  --  24  --  23 25 22   GLUCOSE 91   < > 94   < > 99  --  103*  --  111* 114* 89  BUN 5*   < > 9   < > 8  --  9  --  10  8 9  CREATININE 0.83   < > 0.91   < > 0.86  --  0.84  --  0.84 0.86 0.73  CALCIUM 8.7*   < > 9.1   < > 8.8*   < > 8.7*   < > 8.8* 8.9 8.9  MG 2.0  --  1.8  --   --   --   --   --   --  1.7  --    < > = values in this interval not displayed.    Liver Function Tests: No results for input(s): AST, ALT, ALKPHOS, BILITOT, PROT, ALBUMIN in the last 168 hours. No results for input(s): LIPASE, AMYLASE in the last 168 hours. No results for input(s): AMMONIA in the last 168 hours.  CBC: Recent Labs  Lab 12/31/19 0218 01/01/20 0207 01/02/20 0301 01/03/20 0246 01/04/20 0306  WBC 6.7 6.8 7.3 7.2 7.8  HGB 9.5* 10.0* 10.0* 10.0* 9.7*  HCT 31.1* 32.1* 32.9* 32.5* 31.6*  MCV 100.0 99.1 101.2* 100.3* 101.0*  PLT 203 210 208 197 217    INR: Recent Labs  Lab 12/31/19 0218 01/01/20 0207 01/02/20 0301 01/03/20 0246 01/04/20 0306  INR 1.3* 1.2 1.2 1.2 1.2    Other results:      Imaging   No results found.   Medications:     Scheduled Medications: . sodium chloride   Intravenous Once  . amiodarone  200 mg Oral Daily  . calcitonin (salmon)  1 spray Alternating Nares  Daily  . carvedilol  6.25 mg Oral BID WC  . docusate sodium  100 mg Oral Daily  . feeding supplement  1 Container Oral TID BM  . gabapentin  300 mg Oral BID  . hydrALAZINE  50 mg Oral TID  . losartan  50 mg Oral Daily  . pantoprazole sodium  20 mg Oral Daily  . Warfarin - Pharmacist Dosing Inpatient   Does not apply q1600    Infusions: . heparin 800 Units/hr (01/04/20 0814)    PRN Medications: acetaminophen, bupivacaine, fentaNYL (SUBLIMAZE) injection, ondansetron (ZOFRAN) IV, polyethylene glycol, promethazine, traMADol    Assessment/Plan:    1) Ab pain in setting of intractable n/v due to her back pain and pain meds - CT abdomen/pelvis negative.  - Pain now resolved.   2) Low back and left hip pain in setting of L1 compression fracture and new L3 fracture - CT and bone scan with multilevel compression fx at T10, T12, L1, L3, and L4. - Underwent multilevel vertebroplasty with Dr. Estanislado Pandy on 4/28.  - Pain improved - PT/OT following.  - Needs to increase activity.   3) Anorexia - Much improved - CT A/P unremarkable Appetite a little better.   4) Chronic systolic HF: NICM, s/p ICD and LVAD for DT(04/2013).  - now 6+ years out on VAD support - Typically NYHA II but now III in setting of ortho issues - Continue hydralazine to 50 mg tid for now and losartan to 50 daily.    5) LVAD placed for DT 04/2013:  - Failed Entresto due to diuresis and incessant low flow alarms.  - VAD interrogated personally. Parameters stable. - No ASA with GI bleed and intolerance. - LDH stable.   - INR 1.2 unchanged x4 days. , Goal 1.8-2.3. Asked her to hold supplements today. - Remains on heparin drip.  - Continue warfarin. Discussed dosing with PharmD personally.  6) PAF:  - Did not tolerate AF well at all.  - S/p DC-CV 06/18/19 -  Maintaining NSR.  - Continue amio 200 daily.  - Restarted warfarin - No change  7) HTN:  - See above changes to BP-active meds.    8) VT - possibly  due to suction event  - no further VT after IVFs - keep K> 4.0 Mg > 2.0 - Mag 2.0. K 3.8   Continue to increase activity. Home when INR 1.8    Length of Stay: [place], NP 01/04/2020, 8:26 AM  VAD Team --- VAD ISSUES ONLY--- Pager [phone] (7am - 7am)  Advanced Heart Failure Team  Pager [phone] (M-F; 7a - 4p)  Please contact [facility] for night-coverage after hours (4p -7a ) and weekends on [email or web address]  Patient seen and examined with the above-signed Advanced Practice Provider and/or Housestaff. I personally reviewed laboratory data, imaging studies and relevant notes. I independently examined the patient and formulated the important aspects of the plan. I have edited the note to reflect any of my changes or salient points. I have personally discussed the plan with the patient and/or family.  Back pain has resolved but now struggling with pain in her hips. Also her ab pain has returned and is tender over one of her previous CT sites. Ab CT ok. No N/V. Very weak. Requires extensive assistance to get out of bed.   On heparin INR still 1.2  No bleeding. MAPs labile.   General: Elderly in bed  NAD.  HEENT: normal  Neck: supple. JVP not elevated.  Carotids 2+ bilat; no bruits. No lymphadenopathy or thryomegaly appreciated. Cor: LVAD hum.  Lungs: Clear. Abdomen: obese soft, TTP over central ab, non-distended. No hepatosplenomegaly. No bruits or masses. Good bowel sounds. Driveline site clean. Anchor in place.  Extremities: no cyanosis, clubbing, rash. Warm no edema  Neuro: alert & oriented x 3. No focal deficits. Moves all 4 without problem   Plan:   1) work on pain control. Increase tramadol 2) continue PT/OT. Will give pain meds prior to session. Will consult CIR 3) Continue heparin/warfarin. INR 1.2 Discussed dosing with PharmD personally. Home when INR >= 1.8 4) Continue to adjust anti-HTN regimen 5) VAD interrogated personally. Parameters stable.  Glori Bickers,  MD  11:55 AM

## 2020-01-04 NOTE — Progress Notes (Signed)
LVAD Coordinator Rounding Note:  Admitted to Dr. Clayborne Dana service on 12/22/19 due to abdominal pain with refractory nausea and vomiting due to lumbar compression fracture. [name]   HM II LVAD implanted on 04/06/13 by Dr. Cyndia Bent under Destination Therapy criteria due to advanced age.   Pt lying in bed. States that she feels ok, is c/o mild stomach pain today. [name] aware. Pt had CT on 4/25 for stomach pain that was negative. Pt has not been up today. Pt states that she requires at least 2 people when getting up to the chair.   Vital signs: Temp:  98.2 HR: 79 Doppler Pressure: 110 Automatic BP: 113/69 (83) O2 Sat: 97% RA Wt: 164.9>162.7>153.4>165.1>159.8>155.2>165.7>158.2>168.8>160.5 lbs   LVAD interrogation reveals:  Speed:  8600 Flow:  3.4 Power:  4.1w PI: 6.6 Alarms: none last 24 hrs; multiple LF 5/1-5/2 Events: 6 PI yesterday  Fixed speed: 8600 Low speed limit: 8000  Drive Line:  C/D/I with anchor intact and accurately applied. Weekly dressing changes per bedside nurse or patient's husband. Next dressing change due 01/06/20.   Labs:  LDH trend: 203>219>202>179>177>189>249>222>184>182  INR trend: 3.6>3.1>2.8>2.0>1.5>1.5>1.4>1.3>1.2  Anticoagulation Plan: -INR Goal: 2.0 - 2.5 - holding warfarin  -ASA Dose: none (intolerance)  -Heparin 800 u/hr  Device:  St Jude dual ICD - Pacing: DDD 60 -Therapies: on 200   Plan/Recommendations:  1. Call VAD Coordinator for any VAD equipment or drive line issues. 2. Weekly LVAD drive line dressing changes per bedside nurse or patient's husband.  Tanda Rockers RN [place] Coordinator  Office: [phone]  24/7 Pager: [phone]

## 2020-01-04 NOTE — Progress Notes (Signed)
Physical Therapy Treatment Patient Details Name: Brianna Hanson MRN: [id number] DOB: 02/06/1935 Today's Date: 01/04/2020    History of Present Illness 85 yo admitted with N/V due to pain from back with T12, L1, L3, L4 compression fx. PMHx: HF, NICM, PAF, plasma cell disorder, LVAD heartmate II 04/06/13, lap chole    PT Comments    Pt premedicated with ultram and reports it helped with mobility. Pt concerned over call that her son was being admitted at [facility] with increased activity tolerance able to walk around room but limited by bil hip pain rather than back. Pt reports she feels confidant she can ascend step into house with [name] and that she has WC at home if needed for increased mobility. Pt denied OOB to chair due to hip pain and educated for need to start using BSC rather than purewick and reports that [name] is planning on getting home purewick set up. Will continue to follow to maximize function.  VSS    Follow Up Recommendations  Home health PT;Supervision/Assistance - 24 hour     Equipment Recommendations  None recommended by PT    Recommendations for Other Services       Precautions / Restrictions Precautions Precautions: Back;Fall;Other (comment) Precaution Comments: LVAD    Mobility  Bed Mobility Overal bed mobility: Needs Assistance Bed Mobility: Rolling;Sidelying to Sit;Sit to Sidelying Rolling: Supervision Sidelying to sit: Supervision     Sit to sidelying: Min assist General bed mobility comments: cues for log roll technique, increased time with use of rail, pt able to transition to EOB without assist. Return to bed with assist to lift legs to surface  Transfers Overall transfer level: Needs assistance   Transfers: Sit to/from Stand Sit to Stand: Min guard         General transfer comment: required 2 trials and tactile cues to stand  Ambulation/Gait Ambulation/Gait assistance: Min guard Gait Distance (Feet): 50 Feet Assistive device: Rolling  walker (2 wheeled) Gait Pattern/deviations: Step-through pattern;Decreased stride length   Gait velocity interpretation: 1.31 - 2.62 ft/sec, indicative of limited community ambulator General Gait Details: cues for posture, pt utilized RW and able to increase gait with cues and encouragement   Stairs             Wheelchair Mobility    Modified Rankin (Stroke Patients Only)       Balance Overall balance assessment: Needs assistance Sitting-balance support: Bilateral upper extremity supported Sitting balance-Leahy Scale: Fair       Standing balance-Leahy Scale: Poor Standing balance comment: bil UE support for standing and gait                            Cognition Arousal/Alertness: Awake/alert Behavior During Therapy: WFL for tasks assessed/performed Overall Cognitive Status: Within Functional Limits for tasks assessed                                        Exercises      General Comments        Pertinent Vitals/Pain Pain Score: 5  Pain Location: bil hips with gait and sitting Pain Descriptors / Indicators: Aching;Guarding Pain Intervention(s): Limited activity within patient's tolerance;Monitored during session;Premedicated before session;Repositioned    Home Living                      Prior Function  PT Goals (current goals can now be found in the care plan section) Progress towards PT goals: Progressing toward goals    Frequency    Min 3X/week      PT Plan Current plan remains appropriate    Co-evaluation              AM-PAC PT "6 Clicks" Mobility   Outcome Measure  Help needed turning from your back to your side while in a flat bed without using bedrails?: A Little Help needed moving from lying on your back to sitting on the side of a flat bed without using bedrails?: A Little Help needed moving to and from a bed to a chair (including a wheelchair)?: A Little Help needed standing up  from a chair using your arms (e.g., wheelchair or bedside chair)?: A Little Help needed to walk in hospital room?: A Little Help needed climbing 3-5 steps with a railing? : A Lot 6 Click Score: 17    End of Session   Activity Tolerance: Patient tolerated treatment well Patient left: in bed;with call bell/phone within reach Nurse Communication: Mobility status PT Visit Diagnosis: Difficulty in walking, not elsewhere classified (R26.2);Other abnormalities of gait and mobility (R26.89)     Time: [id number] PT Time Calculation (min) (ACUTE ONLY): 20 min  Charges:  $Gait Training: 8-22 mins                     [name], PT Acute Rehabilitation Services Pager: [phone] Office: [place] 01/04/2020, 12:33 PM

## 2020-01-04 NOTE — Progress Notes (Signed)
[place] for heparin and warfarin Indication: hx LVAD  Patient Measurements: Height: 5' (152.4 cm) Weight: 72.8 kg (160 lb 7.9 oz) IBW/kg (Calculated) : 45.5 Heparin Dosing Weight: 61.6 kg   Vital Signs: Temp: 98.2 F (36.8 C) (05/04 0702) Temp Source: Oral (05/04 0702) BP: 113/69 (05/04 0702) Pulse Rate: 73 (05/04 0702)  Labs: Recent Labs    01/02/20 0301 01/02/20 0301 01/02/20 1644 01/03/20 0246 01/04/20 0306  HGB 10.0*   < >  --  10.0* 9.7*  HCT 32.9*  --   --  32.5* 31.6*  PLT 208  --   --  197 217  LABPROT 14.7  --   --  14.6 14.7  INR 1.2  --   --  1.2 1.2  HEPARINUNFRC <0.10*   < > 0.21* 0.20* 0.22*  CREATININE 0.84  --   --  0.86 0.73   < > = values in this interval not displayed.    Estimated Creatinine Clearance: 47.4 mL/min (by C-G formula based on SCr of 0.73 mg/dL).   Assessment: 71 yof with HM2 LVAD, presenting with nausea/vomiting due to multilevel lumbar compression fracture - s/p vertebroplasty 4/28. Warfarin held and low-dose heparin started.  Heparin level 0.22 - will not increase rate per discussion with MD. CBC stable. INR unchanged at 1.2.   PTA dose Warfarin 5mg  MF 2.5 all other days  (dose decreased PTA recently with N/V with back pain)  Goal of Therapy:  Heparin level 0.3-0.5 units/ml INR 1.8-2.3 Monitor platelets by anticoagulation protocol: Yes   Plan:  Continue IV heparin to 800 units/hr.  Per discussion with HF team will not increase over 800 units/hr Warfarin 4mg  tonight Monitor s/s bleeding  Daily heparin level, INR, CBC  Erin Hearing PharmD., BCPS Clinical Pharmacist 01/04/2020 9:54 AM  Please check AMION for all [place] phone numbers After 10:00 PM, call [place] [phone]

## 2020-01-05 ENCOUNTER — Ambulatory Visit: Payer: Medicare Other | Admitting: Orthopaedic Surgery

## 2020-01-05 LAB — CBC
HCT: 32 % — ABNORMAL LOW (ref 36.0–46.0)
Hemoglobin: 9.9 g/dL — ABNORMAL LOW (ref 12.0–15.0)
MCH: 31.1 pg (ref 26.0–34.0)
MCHC: 30.9 g/dL (ref 30.0–36.0)
MCV: 100.6 fL — ABNORMAL HIGH (ref 80.0–100.0)
Platelets: 217 10*3/uL (ref 150–400)
RBC: 3.18 MIL/uL — ABNORMAL LOW (ref 3.87–5.11)
RDW: 15.4 % (ref 11.5–15.5)
WBC: 7.4 10*3/uL (ref 4.0–10.5)
nRBC: 0.3 % — ABNORMAL HIGH (ref 0.0–0.2)

## 2020-01-05 LAB — BASIC METABOLIC PANEL
Anion gap: 9 (ref 5–15)
BUN: 11 mg/dL (ref 8–23)
CO2: 24 mmol/L (ref 22–32)
Calcium: 9.2 mg/dL (ref 8.9–10.3)
Chloride: 104 mmol/L (ref 98–111)
Creatinine, Ser: 0.73 mg/dL (ref 0.44–1.00)
GFR calc Af Amer: >60 mL/min (ref >60)
GFR calc non Af Amer: >60 mL/min (ref >60)
Glucose, Bld: 96 mg/dL (ref 70–99)
Potassium: 4.1 mmol/L (ref 3.5–5.1)
Sodium: 137 mmol/L (ref 135–145)

## 2020-01-05 LAB — PROTIME-INR
INR: 1.3 — ABNORMAL HIGH (ref 0.8–1.2)
Prothrombin Time: 15.8 seconds — ABNORMAL HIGH (ref 11.4–15.2)

## 2020-01-05 LAB — HEPARIN LEVEL (UNFRACTIONATED): Heparin Unfractionated: 0.2 IU/mL — ABNORMAL LOW (ref 0.30–0.70)

## 2020-01-05 LAB — LACTATE DEHYDROGENASE: LDH: 186 U/L (ref 98–192)

## 2020-01-05 MED ORDER — WARFARIN SODIUM 4 MG PO TABS
4.0000 mg | ORAL_TABLET | Freq: Once | ORAL | Status: AC
  Administered 2020-01-05: 4 mg via ORAL
  Filled 2020-01-05: qty 1

## 2020-01-05 NOTE — Plan of Care (Signed)
  Problem: Clinical Measurements: Goal: Ability to maintain clinical measurements within normal limits will improve Outcome: Progressing Goal: Will remain free from infection Outcome: Progressing Goal: Diagnostic test results will improve Outcome: Progressing Goal: Respiratory complications will improve Outcome: Progressing Goal: Cardiovascular complication will be avoided Outcome: Progressing   Problem: Activity: Goal: Risk for activity intolerance will decrease Outcome: Progressing   Problem: Nutrition: Goal: Adequate nutrition will be maintained Outcome: Progressing   Problem: Coping: Goal: Level of anxiety will decrease Outcome: Progressing   Problem: Elimination: Goal: Will not experience complications related to bowel motility Outcome: Progressing Goal: Will not experience complications related to urinary retention Outcome: Progressing   Problem: Pain Managment: Goal: General experience of comfort will improve Outcome: Progressing   Problem: Safety: Goal: Ability to remain free from injury will improve Outcome: Progressing   Problem: Skin Integrity: Goal: Risk for impaired skin integrity will decrease Outcome: Progressing   Problem: Education: Goal: Patient will be able to verbalize current INR target range and antiplatelet therapy for discharge home Outcome: Progressing

## 2020-01-05 NOTE — Progress Notes (Signed)
[place] for heparin and warfarin Indication: hx LVAD  Patient Measurements: Height: 5' (152.4 cm) Weight: 72.9 kg (160 lb 11.5 oz) IBW/kg (Calculated) : 45.5 Heparin Dosing Weight: 61.6 kg   Vital Signs: Temp: 98.7 F (37.1 C) (05/05 0744) Temp Source: Oral (05/05 0744) BP: 94/72 (05/05 0744) Pulse Rate: 81 (05/05 0800)  Labs: Recent Labs    01/03/20 0246 01/03/20 0246 01/04/20 0306 01/05/20 0313  HGB 10.0*   < > 9.7* 9.9*  HCT 32.5*  --  31.6* 32.0*  PLT 197  --  217 217  LABPROT 14.6  --  14.7 15.8*  INR 1.2  --  1.2 1.3*  HEPARINUNFRC 0.20*  --  0.22* 0.20*  CREATININE 0.86  --  0.73 0.73   < > = values in this interval not displayed.    Estimated Creatinine Clearance: 47.5 mL/min (by C-G formula based on SCr of 0.73 mg/dL).   Assessment: 38 yof with HM2 LVAD, presenting with nausea/vomiting due to multilevel lumbar compression fracture - s/p vertebroplasty 4/28. Warfarin held and low-dose heparin started.  Heparin level 0.2 - will not increase rate per discussion with MD. CBC stable. INR slightly moving at 1.3.   PTA dose Warfarin 5mg  MF 2.5 all other days  (dose decreased PTA recently with N/V with back pain)  Goal of Therapy:  Heparin level <0.5 units/ml INR 1.8-2.3 Monitor platelets by anticoagulation protocol: Yes   Plan:  Continue IV heparin to 800 units/hr.  Per discussion with HF team will not increase over 800 units/hr Warfarin 4mg  again tonight Monitor s/s bleeding  Daily heparin level, INR, CBC  Erin Hearing PharmD., BCPS Clinical Pharmacist 01/05/2020 9:58 AM  Please check AMION for all [place] phone numbers After 10:00 PM, call [place] [phone]

## 2020-01-05 NOTE — Progress Notes (Signed)
Nutrition Follow-up  DOCUMENTATION CODES:   Non-severe (moderate) malnutrition in context of chronic illness, Obesity unspecified  INTERVENTION:   - Encourage adequate PO intake  - If PO intake decreases, will reorder Boost Breeze po TID, each supplement provides 250 kcal and 9 grams of protein  - Recommend liberalizing diet to Regular  NUTRITION DIAGNOSIS:   Moderate Malnutrition related to chronic illness (CHF) as evidenced by mild fat depletion, mild muscle depletion, moderate muscle depletion, percent weight loss (9.1% weight loss in 3 months).  Ongoing, being addressed via supplements  GOAL:   Patient will meet greater than or equal to 90% of their needs   Progressing  MONITOR:   PO intake, Supplement acceptance, Labs, Weight trends, I & O's  REASON FOR ASSESSMENT:   Malnutrition Screening Tool    ASSESSMENT:   85 year old female who was admitted with abdominal pain and refractory N/V due to lumbar compression fracture. PMH of CHF s/p LVAD in 2014, PAF, plasma cell disorder.  4/28 - s/p T10, T12, L3 and L4 vertebroplasties  Spoke with pt at bedside. Pt states that she is feeling better and that she has a great appetite. Pt reports that she ate everything on her meal tray for breakfast except grits. Pt states that she does not think she needs the Boost Breeze supplements as she is eating plenty at meals.  RD encouraged adequate PO intake at meals with a focus on protein-rich foods.  Current weight: 72.9 kg Admission weight: 7.8 kg  Meal Completion: 50-100% x last 8 recorded meals  Medications reviewed and include: calcitonin, colace, protonix, warfarin, heparin drip  Labs reviewed.  UOP: 700 ml x 24 hours  Diet Order:   Diet Order            Diet 2 gram sodium Room service appropriate? Yes; Fluid consistency: Thin  Diet effective now              EDUCATION NEEDS:   Education needs have been addressed  Skin:  Skin Assessment: Skin Integrity  Issues: Incisions: vertebral column  Last BM:  01/04/20  Height:   Ht Readings from Last 1 Encounters:  12/28/19 5' (1.524 m)    Weight:   Wt Readings from Last 1 Encounters:  01/05/20 72.9 kg    Ideal Body Weight:  45.5 kg  BMI:  Body mass index is 31.39 kg/m.  Estimated Nutritional Needs:   Kcal:  1800-2000  Protein:  90-105 grams  Fluid:  >/= 1.5 L    Gaynell Face, MS, RD, LDN Inpatient Clinical Dietitian Pager: [phone] Weekend/After Hours: [phone]

## 2020-01-05 NOTE — Progress Notes (Signed)
Physical Therapy Treatment Patient Details Name: Brianna Hanson MRN: [id number] DOB: Jan 02, 1935 Today's Date: 01/05/2020    History of Present Illness 85 yo admitted with N/V due to pain from back with T12, L1, L3, L4 compression fx. PMHx: HF, NICM, PAF, plasma cell disorder, LVAD heartmate II 04/06/13, lap chole    PT Comments    Pt making good progress. Pt eager to return home with husband.    Follow Up Recommendations  Home health PT;Supervision/Assistance - 24 hour     Equipment Recommendations  None recommended by PT    Recommendations for Other Services       Precautions / Restrictions Precautions Precautions: Back;Fall;Other (comment) Precaution Comments: LVAD    Mobility  Bed Mobility Overal bed mobility: Modified Independent;Needs Assistance Bed Mobility: Rolling;Sidelying to Sit;Sit to Sidelying Rolling: Supervision Sidelying to sit: Supervision     Sit to sidelying: Min assist General bed mobility comments: Assist to bring legs back up into bed  Transfers Overall transfer level: Needs assistance Equipment used: Rolling walker (2 wheeled);4-wheeled walker Transfers: Sit to/from Stand Sit to Stand: Min guard         General transfer comment: Incr time to rise  Ambulation/Gait Ambulation/Gait assistance: Supervision Gait Distance (Feet): 65 Feet Assistive device: 4-wheeled walker Gait Pattern/deviations: Step-through pattern;Decreased stride length Gait velocity: decr Gait velocity interpretation: 1.31 - 2.62 ft/sec, indicative of limited community ambulator General Gait Details: Assist for lines   Stairs             Wheelchair Mobility    Modified Rankin (Stroke Patients Only)       Balance Overall balance assessment: Needs assistance Sitting-balance support: No upper extremity supported;Feet supported Sitting balance-Leahy Scale: Fair     Standing balance support: Bilateral upper extremity supported Standing balance-Leahy Scale:  Poor Standing balance comment: rollator and supervision for static standing                            Cognition Arousal/Alertness: Awake/alert Behavior During Therapy: WFL for tasks assessed/performed Overall Cognitive Status: Within Functional Limits for tasks assessed                                        Exercises      General Comments        Pertinent Vitals/Pain Pain Assessment: Faces Pain Location: bil hips with gait and sitting Pain Descriptors / Indicators: Aching Pain Intervention(s): Limited activity within patient's tolerance;Monitored during session    Home Living                      Prior Function            PT Goals (current goals can now be found in the care plan section) Progress towards PT goals: Progressing toward goals    Frequency    Min 3X/week      PT Plan Current plan remains appropriate    Co-evaluation              AM-PAC PT "6 Clicks" Mobility   Outcome Measure  Help needed turning from your back to your side while in a flat bed without using bedrails?: A Little Help needed moving from lying on your back to sitting on the side of a flat bed without using bedrails?: A Little Help needed moving to and from a bed  to a chair (including a wheelchair)?: A Little Help needed standing up from a chair using your arms (e.g., wheelchair or bedside chair)?: A Little Help needed to walk in hospital room?: A Little Help needed climbing 3-5 steps with a railing? : A Lot 6 Click Score: 17    End of Session   Activity Tolerance: Patient tolerated treatment well Patient left: in bed;with call bell/phone within reach Nurse Communication: Mobility status PT Visit Diagnosis: Difficulty in walking, not elsewhere classified (R26.2);Other abnormalities of gait and mobility (R26.89)     Time: [id number] PT Time Calculation (min) (ACUTE ONLY): 22 min  Charges:  $Gait Training: 8-22 mins                      [place] Pager [phone] Office [place] 01/05/2020, 12:28 PM

## 2020-01-05 NOTE — Progress Notes (Signed)
LVAD Coordinator Rounding Note:  Admitted to Dr. Clayborne Dana service on 12/22/19 due to abdominal pain with refractory nausea and vomiting due to lumbar compression fracture. [name]   HM II LVAD implanted on 04/06/13 by Dr. Cyndia Bent under Destination Therapy criteria due to advanced age.   Pt lying in bed, awake, pleasant. [name] at bedside. Pt reports she is having no back pain, "just hip" pain and attributes it to arthritis. She reports she got OOB yesterday and walked to door and back.   Discussion with her and [name] about IP rehab. They both expressed the desire for her to go home with home PT. [name] says he can handle her needs, pt reports she will "do better at home" by taking her time while getting up" and "pushing through the pain".   Emylee reports increasing appetite and feeling "better overall". Discharge planned when INR therapeutic.   Vital signs: Temp:  98.7 HR: 73 Doppler Pressure: 88 Automatic BP:  94/72 (81) O2 Sat: 97% RA Wt: 164.9>162.7>153.4>165.1>159.8>155.2>165.7>158.2>168.8>160.5>160.7 lbs   LVAD interrogation reveals:  Speed:  8600 Flow:  3.2 Power:  4.0w PI: 5.9 Alarms: 2 low flows yesterday  Events: 3 PI yesterday  Fixed speed: 8600 Low speed limit: 8000  Drive Line:  C/D/I with anchor intact and accurately applied. Weekly dressing changes per bedside nurse or patient's husband. Next dressing change due 01/06/20.   Labs:  LDH trend: 203>219>202>179>177>189>249>222>184>182>186  INR trend: 3.6>3.1>2.8>2.0>1.5>1.5>1.4>1.3>1.2>1.3  Anticoagulation Plan: -INR Goal: 2.0 - 2.5  -ASA Dose: none (intolerance)  -Heparin 800 u/hr  Device:  St Jude dual ICD - Pacing: DDD 60 -Therapies: on 200  Plan/Recommendations:  1. Call VAD Coordinator for any VAD equipment or drive line issues. 2. Weekly LVAD drive line dressing changes per bedside nurse or patient's husband.  Zada Girt RN [place] Coordinator  Office: [phone]  24/7 Pager: [phone]

## 2020-01-05 NOTE — Progress Notes (Addendum)
Patient ID: Brianna Hanson, female   DOB: 01/01/1935, 85 y.o.   MRN: [id number]   Advanced Heart Failure VAD Team Note  PCP-Cardiologist: No primary care provider on file.   Subjective:   85 y/o female w/ NICM, s/p ICD and LVAD for DT(04/2013), Admitted with N/V, abd pain due to multilevel lumbar compression fractures.    S/p multi-level vertebroplasty on 4/28.   Back on coumadin + heparin bridge. INR remains subtherapeutic at 1.3. Hgb 9.9.  LDH 186  MAPs better today in the 80s.    She feels pretty good today. No back pain. No n/v. Only complaint is rt LEE pain, just distal to the knee. Pain resolved w/ heating pad.   Had 2 low flows and 3 PI events yesterday.    LVAD INTERROGATION:  HeartMate II LVAD:  Flow 3.4   liters/min, speed 8600, power 4.0  PI 6.  , 2 Low Flows and 3PI events yesterday.  VAD interrogated personally.     Objective:    Vital Signs:   Temp:  [98.2 F (36.8 C)-98.7 F (37.1 C)] 98.7 F (37.1 C) (05/05 0744) Pulse Rate:  [69-81] 81 (05/05 0800) Resp:  [13-19] 18 (05/05 0800) BP: (93-111)/(67-78) 94/72 (05/05 0744) SpO2:  [95 %-99 %] 99 % (05/05 0800) Weight:  [72.9 kg] 72.9 kg (05/05 0352) Last BM Date: 01/04/20 Mean arterial Pressure 80s   Intake/Output:   Intake/Output Summary (Last 24 hours) at 01/05/2020 1002 Last data filed at 01/05/2020 0400 Gross per 24 hour  Intake 605.99 ml  Output 700 ml  Net -94.01 ml     Physical Exam   PHYSICAL EXAM: General:  Well appearing elderly AAF. No respiratory difficulty HEENT: normal Neck: supple. no JVD. Carotids 2+ bilat; no bruits. No lymphadenopathy or thyromegaly appreciated. Cor: + LVAD HUM  Lungs: clear Abdomen: soft, nontender, nondistended. No hepatosplenomegaly. No bruits or masses. Good bowel sounds. LVAD exit site: well-healed and incorporated.  Dressing dry and intact.  No erythema or drainage.   Extremities: no cyanosis, clubbing, rash, edema, heating pad on rt knee, posterior calf palpated,  no palpable cords Neuro: alert & oriented x 3, cranial nerves grossly intact. moves all 4 extremities w/o difficulty. Affect pleasant.   Telemetry   NSR 70s  Labs   Basic Metabolic Panel: Recent Labs  Lab 12/30/19 0620 12/31/19 0218 01/01/20 0207 01/01/20 0207 01/02/20 0301 01/02/20 0301 01/03/20 0246 01/04/20 0306 01/05/20 0313  NA 141   < > 142  --  138  --  140 138 137  K 3.9   < > 4.0  --  3.6  --  3.3* 3.8 4.1  CL 109   < > 109  --  106  --  107 107 104  CO2 23   < > 24  --  23  --  25 22 24   GLUCOSE 94   < > 103*  --  111*  --  114* 89 96  BUN 9   < > 9  --  10  --  8 9 11   CREATININE 0.91   < > 0.84  --  0.84  --  0.86 0.73 0.73  CALCIUM 9.1   < > 8.7*   < > 8.8*   < > 8.9 8.9 9.2  MG 1.8  --   --   --   --   --  1.7  --   --    < > = values in this interval not displayed.  Liver Function Tests: No results for input(s): AST, ALT, ALKPHOS, BILITOT, PROT, ALBUMIN in the last 168 hours. No results for input(s): LIPASE, AMYLASE in the last 168 hours. No results for input(s): AMMONIA in the last 168 hours.  CBC: Recent Labs  Lab 01/01/20 0207 01/02/20 0301 01/03/20 0246 01/04/20 0306 01/05/20 0313  WBC 6.8 7.3 7.2 7.8 7.4  HGB 10.0* 10.0* 10.0* 9.7* 9.9*  HCT 32.1* 32.9* 32.5* 31.6* 32.0*  MCV 99.1 101.2* 100.3* 101.0* 100.6*  PLT 210 208 197 217 217    INR: Recent Labs  Lab 01/01/20 0207 01/02/20 0301 01/03/20 0246 01/04/20 0306 01/05/20 0313  INR 1.2 1.2 1.2 1.2 1.3*    Other results:      Imaging   No results found.   Medications:     Scheduled Medications: . sodium chloride   Intravenous Once  . amiodarone  200 mg Oral Daily  . calcitonin (salmon)  1 spray Alternating Nares Daily  . carvedilol  6.25 mg Oral BID WC  . docusate sodium  100 mg Oral Daily  . gabapentin  300 mg Oral BID  . hydrALAZINE  50 mg Oral TID  . losartan  50 mg Oral Daily  . pantoprazole sodium  20 mg Oral Daily  . warfarin  4 mg Oral ONCE-1600  .  Warfarin - Pharmacist Dosing Inpatient   Does not apply q1600    Infusions: . heparin 800 Units/hr (01/04/20 0814)    PRN Medications: acetaminophen, bupivacaine, fentaNYL (SUBLIMAZE) injection, ondansetron (ZOFRAN) IV, polyethylene glycol, promethazine, traMADol    Assessment/Plan:    1) Ab pain in setting of intractable n/v due to her back pain and pain meds - CT abdomen/pelvis negative.  - Pain now resolved.   2) Low back and left hip pain in setting of L1 compression fracture and new L3 fracture - CT and bone scan with multilevel compression fx at T10, T12, L1, L3, and L4. - Underwent multilevel vertebroplasty with Dr. Estanislado Pandy on 4/28.  - Pain improved - PT/OT following. They have recommended Home Health PT  - Needs to increase activity.   3) Anorexia - Much improved - CT A/P unremarkable - Appetite a little better.   4) Chronic systolic HF: NICM, s/p ICD and LVAD for DT(04/2013).  - now 6+ years out on VAD support - Typically NYHA II but now III in setting of ortho issues - Continue hydralazine to 50 mg tid for now and losartan 50 daily.    5) LVAD placed for DT 04/2013:  - Failed Entresto due to diuresis and incessant low flow alarms.  - VAD interrogated personally. Parameters stable. - No ASA with GI bleed and intolerance. - LDH stable.   - INR 1.3 , Goal 1.8-2.3. Asked her to hold supplements today. - Remains on heparin drip.  - Continue warfarin. Discussed dosing with PharmD personally.  6) PAF:  - Did not tolerate AF well at all.  - S/p DC-CV 06/18/19 - Maintaining NSR.  - Continue amio 200 daily.  - Restarted warfarin - No change  7) HTN:  - MAPs in the 80s today     8) VT - possibly due to suction event  - no further VT after IVFs - keep K> 4.0 Mg > 2.0 - K 4.1 today . Check Mg in AM    Continue to mobilize w/ PT. Home when INR 1.8    Length of Stay: [address] PA-C 01/05/2020, 10:02 AM  VAD Team --- VAD ISSUES  ONLY--- Pager [phone] (7am - 7am)  Advanced Heart Failure Team  Pager [phone] (M-F; 7a - 4p)  Please contact [facility] for night-coverage after hours (4p -7a ) and weekends on [email or web address]  Patient seen and examined with the above-signed Advanced Practice Provider and/or Housestaff. I personally reviewed laboratory data, imaging studies and relevant notes. I independently examined the patient and formulated the important aspects of the plan. I have edited the note to reflect any of my changes or salient points. I have personally discussed the plan with the patient and/or family.   Feeling better today. Ab pain improved. No n/v. Appetite ok. Still with some knee pain. Back pain resolved. On heparin - no bleeding. INR 1.3  General:  NAD.  HEENT: normal  Neck: supple. JVP not elevated.  Carotids 2+ bilat; no bruits. No lymphadenopathy or thryomegaly appreciated. Cor: LVAD hum.  Lungs: Clear. Abdomen: obese soft, nontender, non-distended. No hepatosplenomegaly. No bruits or masses. Good bowel sounds. Driveline site clean. Anchor in place.  Extremities: no cyanosis, clubbing, rash. Warm no edema  Neuro: alert & oriented x 3. No focal deficits. Moves all 4 without problem    She is better today. Seen by PT yesterday and felt to be suitable for HHPT. Remains on heparin and warfarin. INR 1.3. Home when INR >= 1.8. Discussed dosing with PharmD personally. MAPs ok. VAD interrogated personally. Parameters stable.  Glori Bickers, MD  12:15 PM

## 2020-01-06 LAB — BASIC METABOLIC PANEL
Anion gap: 9 (ref 5–15)
BUN: 10 mg/dL (ref 8–23)
CO2: 26 mmol/L (ref 22–32)
Calcium: 9.1 mg/dL (ref 8.9–10.3)
Chloride: 104 mmol/L (ref 98–111)
Creatinine, Ser: 0.88 mg/dL (ref 0.44–1.00)
GFR calc Af Amer: >60 mL/min (ref >60)
GFR calc non Af Amer: >60 mL/min (ref >60)
Glucose, Bld: 94 mg/dL (ref 70–99)
Potassium: 4.2 mmol/L (ref 3.5–5.1)
Sodium: 139 mmol/L (ref 135–145)

## 2020-01-06 LAB — HEPARIN LEVEL (UNFRACTIONATED): Heparin Unfractionated: 0.19 IU/mL — ABNORMAL LOW (ref 0.30–0.70)

## 2020-01-06 LAB — PROTIME-INR
INR: 1.4 — ABNORMAL HIGH (ref 0.8–1.2)
Prothrombin Time: 16.8 seconds — ABNORMAL HIGH (ref 11.4–15.2)

## 2020-01-06 LAB — LACTATE DEHYDROGENASE: LDH: 179 U/L (ref 98–192)

## 2020-01-06 LAB — MAGNESIUM: Magnesium: 1.8 mg/dL (ref 1.7–2.4)

## 2020-01-06 MED ORDER — WARFARIN SODIUM 5 MG PO TABS
5.0000 mg | ORAL_TABLET | Freq: Once | ORAL | Status: AC
  Administered 2020-01-06: 5 mg via ORAL
  Filled 2020-01-06: qty 1

## 2020-01-06 MED ORDER — MAGNESIUM OXIDE 400 (241.3 MG) MG PO TABS
200.0000 mg | ORAL_TABLET | Freq: Two times a day (BID) | ORAL | Status: DC
  Administered 2020-01-06 – 2020-01-07 (×3): 200 mg via ORAL
  Filled 2020-01-06 (×3): qty 1

## 2020-01-06 MED ORDER — MAGNESIUM SULFATE 2 GM/50ML IV SOLN
2.0000 g | Freq: Once | INTRAVENOUS | Status: AC
  Administered 2020-01-06: 2 g via INTRAVENOUS
  Filled 2020-01-06: qty 50

## 2020-01-06 NOTE — Progress Notes (Signed)
Occupational Therapy Treatment Patient Details Name: Brianna Hanson MRN: [id number] DOB: 04-15-1935 Today's Date: 01/06/2020    History of present illness 85 yo admitted with N/V due to pain from back with T12, L1, L3, L4 compression fx. PMHx: HF, NICM, PAF, plasma cell disorder, LVAD heartmate II 04/06/13, lap chole   OT comments  Pt agreeable to ambulating in room, toileting and standing grooming, but wanted to return to bed due to hip pain and discomfort of chair.  Follow Up Recommendations  Home health OT;Supervision/Assistance - 24 hour    Equipment Recommendations  None recommended by OT    Recommendations for Other Services      Precautions / Restrictions Precautions Precautions: Fall;Back(for comfort)       Mobility Bed Mobility Overal bed mobility: Needs Assistance Bed Mobility: Rolling;Sidelying to Sit Rolling: Modified independent (Device/Increase time) Sidelying to sit: Modified independent (Device/Increase time)   Sit to supine: Min assist   General bed mobility comments: Assist to bring legs back up into bed  Transfers Overall transfer level: Needs assistance Equipment used: 4-wheeled walker Transfers: Sit to/from Stand Sit to Stand: Min assist         General transfer comment: min assist to rise    Balance Overall balance assessment: Needs assistance   Sitting balance-Leahy Scale: Fair       Standing balance-Leahy Scale: Fair Standing balance comment: statically at sink                           ADL either performed or assessed with clinical judgement   ADL Overall ADL's : Needs assistance/impaired     Grooming: Oral care;Standing;Supervision/safety                   Toilet Transfer: Min guard;Ambulation;RW;BSC   Toileting- Water quality scientist and Hygiene: Min guard;Sit to/from stand       Functional mobility during ADLs: Min guard;Rolling walker General ADL Comments: assist for lines only, pt does not like to walk  in hall     Vision       Perception     Praxis      Cognition Arousal/Alertness: Awake/alert Behavior During Therapy: WFL for tasks assessed/performed Overall Cognitive Status: Within Functional Limits for tasks assessed                                          Exercises     Shoulder Instructions       General Comments      Pertinent Vitals/ Pain       Pain Assessment: Faces Faces Pain Scale: Hurts even more Pain Location: bil hips with gait and sitting Pain Descriptors / Indicators: Aching Pain Intervention(s): Limited activity within patient's tolerance  Home Living                                          Prior Functioning/Environment              Frequency  Min 2X/week        Progress Toward Goals  OT Goals(current goals can now be found in the care plan section)  Progress towards OT goals: Progressing toward goals  Acute Rehab OT Goals Patient Stated Goal: go home OT Goal Formulation: With patient  Time For Goal Achievement: 01/20/20 Potential to Achieve Goals: Good  Plan Discharge plan remains appropriate    Co-evaluation                 AM-PAC OT "6 Clicks" Daily Activity     Outcome Measure   Help from another person eating meals?: None Help from another person taking care of personal grooming?: A Little Help from another person toileting, which includes using toliet, bedpan, or urinal?: A Little Help from another person bathing (including washing, rinsing, drying)?: A Little Help from another person to put on and taking off regular upper body clothing?: A Little Help from another person to put on and taking off regular lower body clothing?: A Little 6 Click Score: 19    End of Session Equipment Utilized During Treatment: Rolling walker  OT Visit Diagnosis: Unsteadiness on feet (R26.81);Other abnormalities of gait and mobility (R26.89);Muscle weakness (generalized) (M62.81);Pain   Activity  Tolerance Patient limited by pain   Patient Left in bed;with call bell/phone within reach   Nurse Communication          Time: 1225-1258 OT Time Calculation (min): 33 min  Charges: OT General Charges $OT Visit: 1 Visit OT Treatments $Self Care/Home Management : 23-37 mins  Nestor Lewandowsky, OTR/L Acute Rehabilitation Services Pager: [phone] Office: [phone]   Malka So 01/06/2020, 1:14 PM

## 2020-01-06 NOTE — Progress Notes (Signed)
[place] for heparin and warfarin Indication: hx LVAD  Patient Measurements: Height: 5' (152.4 cm) Weight: 73.7 kg (162 lb 7.7 oz) IBW/kg (Calculated) : 45.5 Heparin Dosing Weight: 61.6 kg   Vital Signs: Temp: 97.9 F (36.6 C) (05/06 0745) Temp Source: Oral (05/06 0745) BP: 116/81 (05/06 0745) Pulse Rate: 70 (05/06 0745)  Labs: Recent Labs    01/04/20 0306 01/05/20 0313 01/06/20 0338  HGB 9.7* 9.9*  --   HCT 31.6* 32.0*  --   PLT 217 217  --   LABPROT 14.7 15.8* 16.8*  INR 1.2 1.3* 1.4*  HEPARINUNFRC 0.22* 0.20* 0.19*  CREATININE 0.73 0.73 0.88    Estimated Creatinine Clearance: 43.4 mL/min (by C-G formula based on SCr of 0.88 mg/dL).   Assessment: 19 yof with HM2 LVAD, presenting with nausea/vomiting due to multilevel lumbar compression fracture - s/p vertebroplasty 4/28. Warfarin held and low-dose heparin started.  Heparin level 0.19 - will not increase rate per discussion with MD. CBC stable. INR moving slowly at 1.4 despite doses of Coumadin above her home dose.  On amiodarone, but at same dosage as PTA.  PTA dose Warfarin 2.5 mg daily except 1.25 mg on Fridays  (dose decreased PTA recently with N/V with back pain)  Goal of Therapy:  Heparin level <0.5 units/ml INR 1.8-2.3 Monitor platelets by anticoagulation protocol: Yes   Plan:  Continue IV heparin to 800 units/hr.  Per discussion with HF team will not increase over 800 units/hr Warfarin 5 mg x 1 tonight Monitor s/s bleeding  Daily heparin level, INR, CBC  [place] Crane, Roylene Reason, [facility] Clinical Pharmacist Phone [phone]  01/06/2020 10:47 AM   Please check AMION for all [place] phone numbers After 10:00 PM, call [place] [phone]

## 2020-01-06 NOTE — Progress Notes (Addendum)
Patient ID: Brianna Hanson, female   DOB: 08/20/1935, 85 y.o.   MRN: [id number]   Advanced Heart Failure VAD Team Note  PCP-Cardiologist: No primary care provider on file.   Subjective:   85 y/o female w/ NICM, s/p ICD and LVAD for DT(04/2013), Admitted with N/V, abd pain due to multilevel lumbar compression fractures.    S/p multi-level vertebroplasty on 4/28.   Back on coumadin + heparin bridge. INR remains subtherapeutic at 1.4  LDH 179   Ambulated 65 feet with  PT. Denies back pain. Complaining of hip pain.   LVAD INTERROGATION:  HeartMate II LVAD:  Flow 3.7   liters/min, speed 8600, power 4.0  PI 6.9. No PI events over the last 24 hours. VAD interrogated personally.     Objective:    Vital Signs:   Temp:  [97.9 F (36.6 C)-98.7 F (37.1 C)] 98.6 F (37 C) (05/06 0303) Pulse Rate:  [71-82] 76 (05/06 0303) Resp:  [13-27] 18 (05/06 0303) BP: (87-107)/(56-80) 93/73 (05/06 0303) SpO2:  [95 %-100 %] 95 % (05/06 0303) Weight:  [73.7 kg] 73.7 kg (05/06 0406) Last BM Date: 01/04/20 Mean arterial Pressure 80s   Intake/Output:   Intake/Output Summary (Last 24 hours) at 01/06/2020 0708 Last data filed at 01/06/2020 0655 Gross per 24 hour  Intake 454.86 ml  Output 1550 ml  Net -1095.14 ml     Physical Exam   Physical Exam: GENERAL: NAD acute distress. HEENT: normal  NECK: Supple, JVP flat  .  2+ bilaterally, no bruits.  No lymphadenopathy or thyromegaly appreciated.   CARDIAC:  Mechanical heart sounds with LVAD hum present.  LUNGS:  Clear to auscultation bilaterally.  ABDOMEN:  Soft, round, nontender, positive bowel sounds x4.     LVAD exit site: well-healed and incorporated.  Dressing dry and intact.  No erythema or drainage.  Stabilization device present and accurately applied.  Driveline dressing is being changed daily per sterile technique. EXTREMITIES:  Warm and dry, no cyanosis, clubbing, rash or edema  NEUROLOGIC:  Alert and oriented x 3.    No aphasia.  No dysarthria.   Affect pleasant.       Telemetry   NSR 70s Personally reviewed   Labs   Basic Metabolic Panel: Recent Labs  Lab 01/02/20 0301 01/02/20 0301 01/03/20 0246 01/03/20 0246 01/04/20 0306 01/05/20 0313 01/06/20 0338  NA 138  --  140  --  138 137 139  K 3.6  --  3.3*  --  3.8 4.1 4.2  CL 106  --  107  --  107 104 104  CO2 23  --  25  --  22 24 26   GLUCOSE 111*  --  114*  --  89 96 94  BUN 10  --  8  --  9 11 10   CREATININE 0.84  --  0.86  --  0.73 0.73 0.88  CALCIUM 8.8*   < > 8.9   < > 8.9 9.2 9.1  MG  --   --  1.7  --   --   --  1.8   < > = values in this interval not displayed.    Liver Function Tests: No results for input(s): AST, ALT, ALKPHOS, BILITOT, PROT, ALBUMIN in the last 168 hours. No results for input(s): LIPASE, AMYLASE in the last 168 hours. No results for input(s): AMMONIA in the last 168 hours.  CBC: Recent Labs  Lab 01/01/20 0207 01/02/20 0301 01/03/20 0246 01/04/20 0306 01/05/20 0313  WBC  6.8 7.3 7.2 7.8 7.4  HGB 10.0* 10.0* 10.0* 9.7* 9.9*  HCT 32.1* 32.9* 32.5* 31.6* 32.0*  MCV 99.1 101.2* 100.3* 101.0* 100.6*  PLT 210 208 197 217 217    INR: Recent Labs  Lab 01/02/20 0301 01/03/20 0246 01/04/20 0306 01/05/20 0313 01/06/20 0338  INR 1.2 1.2 1.2 1.3* 1.4*    Other results:      Imaging   No results found.   Medications:     Scheduled Medications: . sodium chloride   Intravenous Once  . amiodarone  200 mg Oral Daily  . calcitonin (salmon)  1 spray Alternating Nares Daily  . carvedilol  6.25 mg Oral BID WC  . docusate sodium  100 mg Oral Daily  . gabapentin  300 mg Oral BID  . hydrALAZINE  50 mg Oral TID  . losartan  50 mg Oral Daily  . pantoprazole sodium  20 mg Oral Daily  . Warfarin - Pharmacist Dosing Inpatient   Does not apply q1600    Infusions: . heparin 800 Units/hr (01/06/20 0655)    PRN Medications: acetaminophen, bupivacaine, fentaNYL (SUBLIMAZE) injection, ondansetron (ZOFRAN) IV, polyethylene  glycol, promethazine, traMADol    Assessment/Plan:    1) Ab pain in setting of intractable n/v due to her back pain and pain meds - CT abdomen/pelvis negative.  - Pain now resolved.   2) Low back and left hip pain in setting of L1 compression fracture and new L3 fracture - CT and bone scan with multilevel compression fx at T10, T12, L1, L3, and L4. - Underwent multilevel vertebroplasty with Dr. Estanislado Pandy on 4/28.  - Pain improved - PT/OT following. They have recommended [place] has been ordered.    3) Anorexia - Much improved - CT A/P unremarkable - Appetite improving.    4) Chronic systolic HF: NICM, s/p ICD and LVAD for DT(04/2013).  - now 6+ years out on VAD support - Typically NYHA II but now III in setting of ortho issues - Maps stable. Volume status stable. - Continue hydralazine to 50 mg tid for now and losartan 50 daily.    5) LVAD placed for DT 04/2013:  - Failed Entresto due to diuresis and incessant low flow alarms.  - VAD interrogated personally. Parameters stable. - No ASA with GI bleed and intolerance. - LDH stable.   - INR 1.4 , Goal 1.8-2.3. Asked her to hold supplements today. - Remains on heparin drip + warfarin.  - Discussed dosing with PharmD personally.  6) PAF:  - Did not tolerate AF well at all.  - S/p DC-CV 06/18/19 - Maintaining NSR.  - Continue amio 200 daily.  - Continue warfarin.  - No change  7) HTN:  - MAPs in the 80s today , stable.   8) VT - possibly due to suction event  - no further VT after IVFs - keep K> 4.0 Mg > 2.0 - K 4.2 today .  - Mag 1.8. Give 2 grams Mag. Start Mag ox 200 mg daily.    Continue to mobilize w/ PT. Home when INR 1.8   [facility] 12/24/[facility] with [facility]   Length of Stay: [place], NP 01/06/2020, 7:08 AM  VAD Team --- VAD ISSUES ONLY--- Pager [phone] (7am - 7am)  Advanced Heart Failure Team  Pager [phone] (M-F; 7a - 4p)  Please contact [facility] for night-coverage after hours  (4p -7a ) and weekends on [email or web address]  Patient seen and examined with the above-signed Advanced  Practice Provider and/or Housestaff. I personally reviewed laboratory data, imaging studies and relevant notes. I independently examined the patient and formulated the important aspects of the plan. I have edited the note to reflect any of my changes or salient points. I have personally discussed the plan with the patient and/or family.  Doing ok. Remains weak but was able to walk 65 feet with PT. No back pain but hips sore. Ab pain better. Remains on heparin and warfarin. INR 1.4. No bleeding. MAPs ok.   General:  NAD.  HEENT: normal  Neck: supple. JVP not elevated.  Carotids 2+ bilat; no bruits. No lymphadenopathy or thryomegaly appreciated. Cor: LVAD hum.  Lungs: Clear. Abdomen: obese soft, nontender, non-distended. No hepatosplenomegaly. No bruits or masses. Good bowel sounds. Driveline site clean. Anchor in place.  Extremities: no cyanosis, clubbing, rash. Warm no edema  Neuro: alert & oriented x 3. No focal deficits. Moves all 4 without problem   Still weak with significant hip pain. INR subtherapeutic. Continue heparin/warfarin. Discussed dosing with PharmD personally. VAD interrogated personally. Parameters stable. MAPs ok. Supp mag. Home when INR >= 1.8.   Glori Bickers, MD  8:48 AM

## 2020-01-06 NOTE — Progress Notes (Signed)
LVAD Coordinator Rounding Note:  Admitted to Dr. Clayborne Dana service on 12/22/19 due to abdominal pain with refractory nausea and vomiting due to lumbar compression fracture. [name]   HM II LVAD implanted on 04/06/13 by Dr. Cyndia Bent under Destination Therapy criteria due to advanced age.   Pt lying in bed, awake, pleasant. She reports she got OOB yesterday and walked to door and back x 2.   Lashane reports increasing appetite and feeling "better overall". Discharge planned when INR therapeutic.   Vital signs: Temp:  98.7 HR: 73 Doppler Pressure: 82 Automatic BP:  116/81 (94) O2 Sat: 98% RA Wt: 164.9>162.7>153.4>165.1>159.8>155.2>165.7>158.2>168.8>160.5>160.7>162.4 lbs   LVAD interrogation reveals:  Speed:  8600 Flow:  3.6 Power:  4.1w PI: 6.2 Alarms: none last 24 hrs Events: none last 24 hrs  Fixed speed: 8600 Low speed limit: 8000  Drive Line:  C/D/I with anchor intact and accurately applied. Weekly dressing changes per bedside nurse or patient's husband. Next dressing change due 01/06/20. pts husband planning to do dressing change today. VAD coordinator asked unit secretary to order a weekly kit.   Labs:  LDH trend: 203>219>202>179>177>189>249>222>184>182>186>179  INR trend: 3.6>3.1>2.8>2.0>1.5>1.5>1.4>1.3>1.2>1.3>1.4  Anticoagulation Plan: -INR Goal: 2.0 - 2.5  -ASA Dose: none (intolerance)  -Heparin 800 u/hr  Device:  St Jude dual ICD - Pacing: DDD 60 -Therapies: on 200  Plan/Recommendations:  1. Call VAD Coordinator for any VAD equipment or drive line issues. 2. Weekly LVAD drive line dressing changes per bedside nurse or patient's husband.  Tanda Rockers RN [place] Coordinator  Office: [phone]  24/7 Pager: [phone]

## 2020-01-06 NOTE — Plan of Care (Signed)
  Problem: Clinical Measurements: Goal: Ability to maintain clinical measurements within normal limits will improve Outcome: Progressing Goal: Will remain free from infection Outcome: Progressing Goal: Diagnostic test results will improve Outcome: Progressing Goal: Respiratory complications will improve Outcome: Progressing Goal: Cardiovascular complication will be avoided Outcome: Progressing   Problem: Activity: Goal: Risk for activity intolerance will decrease Outcome: Progressing   Problem: Nutrition: Goal: Adequate nutrition will be maintained Outcome: Progressing   Problem: Coping: Goal: Level of anxiety will decrease Outcome: Progressing   Problem: Elimination: Goal: Will not experience complications related to bowel motility Outcome: Progressing Goal: Will not experience complications related to urinary retention Outcome: Progressing   Problem: Pain Managment: Goal: General experience of comfort will improve Outcome: Progressing   Problem: Safety: Goal: Ability to remain free from injury will improve Outcome: Progressing   Problem: Skin Integrity: Goal: Risk for impaired skin integrity will decrease Outcome: Progressing   Problem: Education: Goal: Patient will be able to verbalize current INR target range and antiplatelet therapy for discharge home Outcome: Progressing

## 2020-01-07 LAB — BASIC METABOLIC PANEL
Anion gap: 9 (ref 5–15)
BUN: 9 mg/dL (ref 8–23)
CO2: 26 mmol/L (ref 22–32)
Calcium: 9.2 mg/dL (ref 8.9–10.3)
Chloride: 104 mmol/L (ref 98–111)
Creatinine, Ser: 0.81 mg/dL (ref 0.44–1.00)
GFR calc Af Amer: >60 mL/min (ref >60)
GFR calc non Af Amer: >60 mL/min (ref >60)
Glucose, Bld: 102 mg/dL — ABNORMAL HIGH (ref 70–99)
Potassium: 4.2 mmol/L (ref 3.5–5.1)
Sodium: 139 mmol/L (ref 135–145)

## 2020-01-07 LAB — CBC
HCT: 31.9 % — ABNORMAL LOW (ref 36.0–46.0)
Hemoglobin: 9.8 g/dL — ABNORMAL LOW (ref 12.0–15.0)
MCH: 30.8 pg (ref 26.0–34.0)
MCHC: 30.7 g/dL (ref 30.0–36.0)
MCV: 100.3 fL — ABNORMAL HIGH (ref 80.0–100.0)
Platelets: 225 10*3/uL (ref 150–400)
RBC: 3.18 MIL/uL — ABNORMAL LOW (ref 3.87–5.11)
RDW: 15.3 % (ref 11.5–15.5)
WBC: 7.3 10*3/uL (ref 4.0–10.5)
nRBC: 0 % (ref 0.0–0.2)

## 2020-01-07 LAB — HEPARIN LEVEL (UNFRACTIONATED): Heparin Unfractionated: 0.17 IU/mL — ABNORMAL LOW (ref 0.30–0.70)

## 2020-01-07 LAB — LACTATE DEHYDROGENASE: LDH: 194 U/L — ABNORMAL HIGH (ref 98–192)

## 2020-01-07 LAB — PROTIME-INR
INR: 1.7 — ABNORMAL HIGH (ref 0.8–1.2)
Prothrombin Time: 18.9 seconds — ABNORMAL HIGH (ref 11.4–15.2)

## 2020-01-07 MED ORDER — MAGNESIUM OXIDE 400 (241.3 MG) MG PO TABS: 200.0000 mg | ORAL_TABLET | Freq: Two times a day (BID) | ORAL | 6 refills | Status: DC

## 2020-01-07 MED ORDER — LOSARTAN POTASSIUM 100 MG PO TABS: 50.0000 mg | ORAL_TABLET | Freq: Every day | ORAL | 3 refills | Status: DC

## 2020-01-07 MED ORDER — HYDRALAZINE HCL 100 MG PO TABS: 50.0000 mg | ORAL_TABLET | Freq: Three times a day (TID) | ORAL | 9 refills | Status: AC

## 2020-01-07 MED ORDER — CALCITONIN (SALMON) 200 UNIT/ACT NA SOLN: 1.0000 | Freq: Every day | NASAL | 12 refills | Status: DC

## 2020-01-07 MED ORDER — TRAMADOL HCL 50 MG PO TABS: 100.0000 mg | ORAL_TABLET | Freq: Two times a day (BID) | ORAL | 0 refills | Status: DC | PRN

## 2020-01-07 MED ORDER — WARFARIN SODIUM 2.5 MG PO TABS: ORAL_TABLET | ORAL | 3 refills | Status: DC

## 2020-01-07 MED FILL — traMADol HCL 50 MG TABS: 50 | 2 days supply | Qty: 10 | Fill #0

## 2020-01-07 MED FILL — MAGNESIUM OXIDE 400 MG TABS: 400 | 60 days supply | Qty: 60 | Fill #0

## 2020-01-07 NOTE — Progress Notes (Signed)
[place] for heparin and warfarin Indication: hx LVAD  Patient Measurements: Height: 5' (152.4 cm) Weight: 71.7 kg (158 lb 1.1 oz) IBW/kg (Calculated) : 45.5 Heparin Dosing Weight: 61.6 kg   Vital Signs: Temp: 97.9 F (36.6 C) (05/07 0717) Temp Source: Oral (05/07 0717) BP: 98/66 (05/07 0717) Pulse Rate: 80 (05/07 0302)  Labs: Recent Labs    01/05/20 0313 01/06/20 0338 01/07/20 0301  HGB 9.9*  --  9.8*  HCT 32.0*  --  31.9*  PLT 217  --  225  LABPROT 15.8* 16.8* 18.9*  INR 1.3* 1.4* 1.7*  HEPARINUNFRC 0.20* 0.19* 0.17*  CREATININE 0.73 0.88 0.81    Estimated Creatinine Clearance: 46.5 mL/min (by C-G formula based on SCr of 0.81 mg/dL).   Assessment: 44 yof with HM2 LVAD, presenting with nausea/vomiting due to multilevel lumbar compression fracture - s/p vertebroplasty 4/28. Warfarin held and low-dose heparin started.  Heparin level 0.1 - will not increase rate per discussion with MD. CBC stable. INR moving slowly at 1.7 despite doses of Coumadin above her home dose.  On amiodarone, but at same dosage as PTA.  PTA dose Warfarin 2.5 mg daily except 1.25 mg on Fridays  (dose decreased PTA recently with N/V with back pain)  Goal of Therapy:  Heparin level <0.5 units/ml INR 1.8-2.3 Monitor platelets by anticoagulation protocol: Yes   Plan:  Pt going home today - planning 2.5 mg daily until INR check on Monday. Monitor s/s bleeding  Daily heparin level, INR, CBC  Marguerite Olea, [facility] Clinical Pharmacist Phone [phone]  01/07/2020 9:15 AM   Please check AMION for all [place] phone numbers After 10:00 PM, call [place] [phone]

## 2020-01-07 NOTE — Plan of Care (Signed)
  Problem: Clinical Measurements: Goal: Ability to maintain clinical measurements within normal limits will improve Outcome: Adequate for Discharge Goal: Will remain free from infection Outcome: Adequate for Discharge Goal: Diagnostic test results will improve Outcome: Adequate for Discharge Goal: Respiratory complications will improve Outcome: Adequate for Discharge Goal: Cardiovascular complication will be avoided Outcome: Adequate for Discharge   Problem: Activity: Goal: Risk for activity intolerance will decrease Outcome: Adequate for Discharge   Problem: Nutrition: Goal: Adequate nutrition will be maintained Outcome: Adequate for Discharge   Problem: Coping: Goal: Level of anxiety will decrease Outcome: Adequate for Discharge   Problem: Elimination: Goal: Will not experience complications related to bowel motility Outcome: Adequate for Discharge Goal: Will not experience complications related to urinary retention Outcome: Adequate for Discharge   Problem: Pain Managment: Goal: General experience of comfort will improve Outcome: Adequate for Discharge   Problem: Safety: Goal: Ability to remain free from injury will improve Outcome: Adequate for Discharge   Problem: Skin Integrity: Goal: Risk for impaired skin integrity will decrease Outcome: Adequate for Discharge   Problem: Education: Goal: Patient will be able to verbalize current INR target range and antiplatelet therapy for discharge home Outcome: Adequate for Discharge

## 2020-01-07 NOTE — Progress Notes (Signed)
Patient ID: Brianna Hanson, female   DOB: 29-Jul-1935, 85 y.o.   MRN: [id number]   Advanced Heart Failure VAD Team Note  PCP-Cardiologist: No primary care provider on file.   Subjective:   85 y/o female w/ NICM, s/p ICD and LVAD for DT(04/2013), Admitted with N/V, abd pain due to multilevel lumbar compression fractures.    S/p multi-level vertebroplasty on 4/28.   Back on coumadin + heparin bridge. INR remains subtherapeutic at 1.7.  Feels ok. Working with rehab. Still with hip pain   HeartMate II LVAD:  Flow 3.9   liters/min, speed 8600, power 4.2  PI 6.8. VAD interrogated personally. Parameters stable.    Objective:    Vital Signs:   Temp:  [97.7 F (36.5 C)-98.8 F (37.1 C)] 97.9 F (36.6 C) (05/07 0717) Pulse Rate:  [70-80] 80 (05/07 0302) Resp:  [14-19] 19 (05/07 0717) BP: (87-116)/(53-81) 98/66 (05/07 0717) SpO2:  [93 %-98 %] 98 % (05/07 0302) Weight:  [71.7 kg] 71.7 kg (05/07 0551) Last BM Date: 01/04/20 Mean arterial Pressure 80s   Intake/Output:   Intake/Output Summary (Last 24 hours) at 01/07/2020 0720 Last data filed at 01/07/2020 0555 Gross per 24 hour  Intake 856.36 ml  Output 1975 ml  Net -1118.64 ml     Physical Exam   Physical Exam: General:  NAD.  HEENT: normal  Neck: supple. JVP not elevated.  Carotids 2+ bilat; no bruits. No lymphadenopathy or thryomegaly appreciated. Cor: LVAD hum.  Lungs: Clear. Abdomen: soft, nontender, non-distended. No hepatosplenomegaly. No bruits or masses. Good bowel sounds. Driveline site clean. Anchor in place.  Extremities: no cyanosis, clubbing, rash. Warm no edema  Neuro: alert & oriented x 3. No focal deficits. Moves all 4 without problem     Telemetry   NSR 70-80s Personally reviewed   Labs   Basic Metabolic Panel: Recent Labs  Lab 01/03/20 0246 01/03/20 0246 01/04/20 0306 01/04/20 0306 01/05/20 0313 01/06/20 0338 01/07/20 0301  NA 140  --  138  --  137 139 139  K 3.3*  --  3.8  --  4.1 4.2 4.2  CL  107  --  107  --  104 104 104  CO2 25  --  22  --  24 26 26   GLUCOSE 114*  --  89  --  96 94 102*  BUN 8  --  9  --  11 10 9   CREATININE 0.86  --  0.73  --  0.73 0.88 0.81  CALCIUM 8.9   < > 8.9   < > 9.2 9.1 9.2  MG 1.7  --   --   --   --  1.8  --    < > = values in this interval not displayed.    Liver Function Tests: No results for input(s): AST, ALT, ALKPHOS, BILITOT, PROT, ALBUMIN in the last 168 hours. No results for input(s): LIPASE, AMYLASE in the last 168 hours. No results for input(s): AMMONIA in the last 168 hours.  CBC: Recent Labs  Lab 01/02/20 0301 01/03/20 0246 01/04/20 0306 01/05/20 0313 01/07/20 0301  WBC 7.3 7.2 7.8 7.4 7.3  HGB 10.0* 10.0* 9.7* 9.9* 9.8*  HCT 32.9* 32.5* 31.6* 32.0* 31.9*  MCV 101.2* 100.3* 101.0* 100.6* 100.3*  PLT 208 197 217 217 225    INR: Recent Labs  Lab 01/03/20 0246 01/04/20 0306 01/05/20 0313 01/06/20 0338 01/07/20 0301  INR 1.2 1.2 1.3* 1.4* 1.7*    Other results:  Imaging   No results found.   Medications:     Scheduled Medications: . sodium chloride   Intravenous Once  . amiodarone  200 mg Oral Daily  . calcitonin (salmon)  1 spray Alternating Nares Daily  . carvedilol  6.25 mg Oral BID WC  . docusate sodium  100 mg Oral Daily  . gabapentin  300 mg Oral BID  . hydrALAZINE  50 mg Oral TID  . losartan  50 mg Oral Daily  . magnesium oxide  200 mg Oral BID  . pantoprazole sodium  20 mg Oral Daily  . Warfarin - Pharmacist Dosing Inpatient   Does not apply q1600    Infusions: . heparin 800 Units/hr (01/07/20 0302)    PRN Medications: acetaminophen, bupivacaine, fentaNYL (SUBLIMAZE) injection, ondansetron (ZOFRAN) IV, polyethylene glycol, promethazine, traMADol    Assessment/Plan:    1) Ab pain in setting of intractable n/v due to her back pain and pain meds - CT abdomen/pelvis negative.  - Pain now resolved.   2) Low back and left hip pain in setting of L1 compression fracture and new L3  fracture - CT and bone scan with multilevel compression fx at T10, T12, L1, L3, and L4. - Underwent multilevel vertebroplasty with Dr. Estanislado Pandy on 4/28.  - Pain improved but still with hip arthritis pain. [place] with tramadol  - PT/OT following. They have recommended [place] and [place] has been ordered.    3) Anorexia - Much improved - CT A/P unremarkable - Appetite improving.   - No change  4) Chronic systolic HF: NICM, s/p ICD and LVAD for DT(04/2013).  - now 6+ years out on VAD support - Typically NYHA II but now III in setting of ortho issues - MAPs stable  - Continue hydralazine to 50 mg tid for now and losartan 50 daily.    5) LVAD placed for DT 04/2013:  - Failed Entresto due to diuresis and incessant low flow alarms.  - VAD interrogated personally. Parameters stable. - No ASA with GI bleed and intolerance. - LDH 194.   - INR 1.7, Goal 1.8-2.3. Should be therapeutic by later this afternoon so ok for d/c  - Remains on heparin drip + warfarin.  - Discussed dosing with PharmD personally.  6) PAF:  - Did not tolerate AF well at all.  - S/p DC-CV 06/18/19 - Maintaining NSR.  - Continue amio 200 daily.  - Continue warfarin.  - No change  7) HTN:  - MAPs in the 80s today , stable.   8) VT - possibly due to suction event  - no further VT after IVFs - keep K> 4.0 Mg > 2.0 - K 4.2 today .  - Mag 1.8. supped yesterday   Ok for d/c today as above.   HHPT [facility] 12/24/19 with [facility]   Length of Stay: 16  Glori Bickers, MD 01/07/2020, 7:20 AM  VAD Team --- VAD ISSUES ONLY--- Pager [phone] (7am - 7am)  Advanced Heart Failure Team  Pager [phone] (M-F; 7a - 4p)  Please contact [facility] for night-coverage after hours (4p -7a ) and weekends on [email or web address]  Patient seen and examined with the above-signed Advanced Practice Provider and/or Housestaff. I personally reviewed laboratory data, imaging studies and relevant notes. I independently examined the patient and  formulated the important aspects of the plan. I have edited the note to reflect any of my changes or salient points. I have personally discussed the plan with the patient and/or family.  Doing ok.  Remains weak but was able to walk 65 feet with PT. No back pain but hips sore. Ab pain better. Remains on heparin and warfarin. INR 1.4. No bleeding. MAPs ok.   General:  NAD.  HEENT: normal  Neck: supple. JVP not elevated.  Carotids 2+ bilat; no bruits. No lymphadenopathy or thryomegaly appreciated. Cor: LVAD hum.  Lungs: Clear. Abdomen: obese soft, nontender, non-distended. No hepatosplenomegaly. No bruits or masses. Good bowel sounds. Driveline site clean. Anchor in place.  Extremities: no cyanosis, clubbing, rash. Warm no edema  Neuro: alert & oriented x 3. No focal deficits. Moves all 4 without problem   Still weak with significant hip pain. INR subtherapeutic. Continue heparin/warfarin. Discussed dosing with PharmD personally. VAD interrogated personally. Parameters stable. MAPs ok. Supp mag. Home when INR >= 1.8.   Glori Bickers, MD  7:20 AM

## 2020-01-07 NOTE — Discharge Summary (Addendum)
Advanced Heart Failure Team  Discharge Summary   Patient ID: Brianna Hanson MRN: [id number], DOB/AGE: [birth year] 83 y.o. Admit date: 12/22/2019 D/C date:     01/07/2020   Primary Discharge Diagnoses:  1) Ab pain in setting of intractable n/v due to her back pain and pain meds 2) Low back and left hip pain in setting of L1 compression fracture and new L3 fracture 3) Anorexia 4) Chronic systolic HF: NICM, s/p ICD and LVAD for DT(04/2013).  5) LVAD placed for DT 04/2013:  6) PAF:  7) HTN:  8) VT   Hospital Course:  Brianna Hanson is a 85 y.o. 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 [name] (followed q 6 months). Underwent implantation of the HeartMate II LVAD on 04/06/13 for DT.    GI Issues 09/2016 LGIB. Angio- no source of bleeding. LVAD speed turned down to 8400   LVAD Speed Change 12/2016 Ramp ECHO -->Speed increased from 8400>8800  01/08/17 - LOW FLOW alarms; speed increased to 8800 RPM  05/20/17> speed decreased to 8600    In 12/20 found to have severe L1 compression fracture. Saw Dr. Lorin Mercy in Ortho. Improved slowly.   Saw Dr. Lorin Mercy again in 11/24/19 with worsening pain. Found to have new L3 compression fx. Treated conservatively. Unable to tolerate narcotic pain meds so taking tramadol with only mild improvement.    She was seen in the [facility] and was having severe back pain and unable to eat much because she felt so bad. We added phenergan to help with n/v and Dr Haroldine Laws reached out to Dr. Lorin Mercy. Throughout the day, back pain and n/v got worse and she developed severe ab pain radiating into epigastrum.   Presented to the ED with abdominal and back pain. CT and bone scan with multilevel compression fx at T10, T12, L1, L3, and L4. Coumadin was held and she underwent Underwent multilevel vertebroplasty with Dr. Estanislado Pandy on 4/28. Pain better controlled and nausea improved after repair. Pain controlled  with ultram and calcitonin nasal spray.   Placed on heparin drip + coumadin and once INR was therapeutic she was discharged to home. She will check INR on Monday and contact the VAD clinic. She has an INR machine.   HHPT will follow for ongoing strengthening. She will continue to be followed closely in the VAD clinic and has follow up next week.    1) Ab pain in setting of intractable n/v due to her back pain and pain meds - CT abdomen/pelvis negative.  - Pain now resolved.    2) Low back and left hip pain in setting of L1 compression fracture and new L3 fracture - Orthopedics and IR consulted  - CT and bone scan with multilevel compression fx at T10, T12, L1, L3, and L4. - Underwent multilevel vertebroplasty with Dr. Estanislado Pandy on 4/28.  - Pain improved but still with hip arthritis pain. Pain controlled with tramadol  - PT/OT following. They have recommended [place] has been ordered.     3) Anorexia - Much improved - CT A/P unremarkable - Appetite improving.   - No change   4) Chronic systolic HF: NICM, s/p ICD and LVAD for DT(04/2013).  - now 6+ years out on VAD support - Typically NYHA II but now III in setting of ortho issues - MAPs stable on reduced dose of hydralazine and losartan.  5) LVAD placed for DT 04/2013:  - Failed Entresto due to diuresis and incessant low flow alarms.  - VAD interrogated personally. Parameters stable. - No ASA with GI bleed and intolerance. - LDH 194.   - INR 1.7, Goal 1.8-2.3. Should be therapeutic by later this afternoon so ok for d/c  - Check INR at home on Sunday.   - Discussed dosing with PharmD personally.   6) PAF:  - Did not tolerate AF well at all.  - S/p DC-CV 06/18/19 - Maintaining NSR.  - Continue amio 200 daily.  - Continue warfarin.  - No change   7) HTN:  Stable. [name]    8) VT - possibly due to suction event  - no further VT after IVFs - keep K> 4.0 Mg > 2.0 - K and Mag supp as needed.  - Continue po mag.     HeartMate II LVAD:  Flow 3.9   liters/min, speed 8600, power 4.2  PI 6.8. VAD interrogated personally. Parameters stable.   Discharge Vitals: Blood pressure 98/66, pulse 80, temperature 97.9 F (36.6 C), temperature source Oral, resp. rate 19, height 5' (1.524 m), weight 71.7 kg, SpO2 98 %.  Labs: Lab Results  Component Value Date   WBC 7.3 01/07/2020   HGB 9.8 (L) 01/07/2020   HCT 31.9 (L) 01/07/2020   MCV 100.3 (H) 01/07/2020   PLT 225 01/07/2020    Recent Labs  Lab 01/07/20 0301  NA 139  K 4.2  CL 104  CO2 26  BUN 9  CREATININE 0.81  CALCIUM 9.2  GLUCOSE 102*   Lab Results  Component Value Date   CHOL 160 05/19/2018   HDL 44 05/19/2018   LDLCALC 78 05/19/2018   TRIG 189 (H) 05/19/2018   BNP (last 3 results) No results for input(s): BNP in the last 8760 hours.  ProBNP (last 3 results) No results for input(s): PROBNP in the last 8760 hours.   Diagnostic Studies/Procedures   No results found.  Discharge Medications   Allergies as of 01/07/2020       Reactions   Oxycodone Other (See Comments)   Hallucinations        Medication List     TAKE these medications    amiodarone 200 MG tablet Commonly known as: PACERONE Take 1 tablet (200 mg total) by mouth daily.   calcitonin (salmon) 200 UNIT/ACT nasal spray Commonly known as: MIACALCIN/FORTICAL Place 1 spray into alternate nostrils daily.   carvedilol 6.25 MG tablet Commonly known as: COREG TAKE 1 TABLET BY MOUTH 2 TIMES DAILY WITH A MEAL What changed: See the new instructions.   cyclobenzaprine 5 MG tablet Commonly known as: FLEXERIL TAKE 1 TABLET BY MOUTH TWICE A DAY AS NEEDED FOR MUSCLE SPASM   docusate sodium 100 MG capsule Commonly known as: COLACE Take 100 mg by mouth daily.   ferrous sulfate 325 (65 FE) MG tablet Take 1 tablet (325 mg total) by mouth 2 (two) times daily with a meal.   furosemide 20 MG tablet Commonly known as: LASIX TAKE 1 TABLET BY MOUTH EVERY DAY AS NEEDED  ONLY WHEN RECOMMENDED What changed: See the new instructions.   gabapentin 100 MG capsule Commonly known as: NEURONTIN Take 3 capsules (300 mg total) by mouth 2 (two) times daily. What changed:  how much to take when to take this   hydrALAZINE 100 MG tablet Commonly known as: APRESOLINE Take 0.5 tablets (50 mg total) by mouth 3 (three) times daily. What changed: how  much to take   losartan 100 MG tablet Commonly known as: COZAAR Take 0.5 tablets (50 mg total) by mouth daily. What changed: how much to take   magnesium oxide 400 (241.3 Mg) MG tablet Commonly known as: MAG-OX Take 0.5 tablets (200 mg total) by mouth 2 (two) times daily.   ondansetron 4 MG tablet Commonly known as: ZOFRAN Take 1 tablet (4 mg total) by mouth every 8 (eight) hours as needed for nausea or vomiting.   pantoprazole 40 MG tablet Commonly known as: PROTONIX Take 40 mg by mouth daily.   potassium chloride SA 20 MEQ tablet Commonly known as: [place]-Con M20 Take 1 tablet (20 mEq total) by mouth 2 (two) times daily.   promethazine 12.5 MG tablet Commonly known as: PHENERGAN Take 1 tablet (12.5 mg total) by mouth every 6 (six) hours as needed for nausea or vomiting.   traMADol 50 MG tablet Commonly known as: ULTRAM Take 2 tablets (100 mg total) by mouth every 12 (twelve) hours as needed for moderate pain.   Vitamin D3 25 MCG (1000 UT) Caps Take 1,000 Units by mouth daily.   warfarin 2.5 MG tablet Commonly known as: COUMADIN Take as directed. If you are unsure how to take this medication, talk to your nurse or doctor. Original instructions: Take 2.5 mg daily What changed: additional instructions               Durable Medical Equipment  (From admission, onward)           Start     Ordered   12/24/19 1022  Heart failure home health orders  (Heart failure home health orders / Face to face)  Once    Comments: Heart Failure Follow-up Care:  Verify follow-up appointments per Patient  Discharge Instructions. Confirm transportation arranged. Reconcile home medications with discharge medication list. Remove discontinued medications from use. Assist patient/caregiver to manage medications using pill box. Reinforce low sodium food selection Assessments: Vital signs and oxygen saturation at each visit. Assess home environment for safety concerns, caregiver support and availability of low-sodium foods. Consult Education officer, museum, PT/OT, Dietitian, and CNA based on assessments. Perform comprehensive cardiopulmonary assessment. Notify MD for any change in condition or weight gain of 3 pounds in one day or 5 pounds in one week with symptoms. Daily Weights and Symptom Monitoring: Ensure patient has access to scales. Teach patient/caregiver to weigh daily before breakfast and after voiding using same scale and record.    Teach patient/caregiver to track weight and symptoms and when to notify Provider. Activity: Develop individualized activity plan with patient/caregiver.  HHOT/HHPT  Question Answer Comment  Heart Failure Follow-up Care Advanced Heart Failure (AHF) Clinic at [phone]   Obtain the following labs Basic Metabolic Panel   Lab frequency Other see comments   Fax lab results to Other see comments   Diet Low Sodium Heart Healthy   Fluid restrictions: 2000 mL Fluid      12/24/19 1022            Disposition   The patient will be discharged in stable condition to home. Discharge Instructions     (HEART FAILURE PATIENTS) Call MD:  Anytime you have any of the following symptoms: 1) 3 pound weight gain in 24 hours or 5 pounds in 1 week 2) shortness of breath, with or without a dry hacking cough 3) swelling in the hands, feet or stomach 4) if you have to sleep on extra pillows at night in order to breathe.  Complete by: As directed    Diet - low sodium heart healthy   Complete by: As directed    INR  Goal: 2 - 2.5   Complete by: As directed    Goal: 2 - 2.5    Increase activity slowly   Complete by: As directed    Page VAD Coordinator at [phone]  Notify for: any VAD alarms, sustained elevations of power >10 watts, sustained drop in Pulse Index <3   Complete by: As directed    Notify for:  any VAD alarms sustained elevations of power >10 watts sustained drop in Pulse Index <3     Speed Settings:   Complete by: As directed    Fixed 8600 RPM Low 8000 RPM      Follow-up [place] Follow up.   Why: HHPT, HHOT             Duration of Discharge Encounter: Greater than 35 minutes   Signed, Amy Clegg NP-C  01/07/2020, 9:15 AM  Patient seen and examined with the above-signed Advanced Practice Provider and/or Housestaff. I personally reviewed laboratory data, imaging studies and relevant notes. I independently examined the patient and formulated the important aspects of the plan. I have edited the note to reflect any of my changes or salient points. I have personally discussed the plan with the patient and/or family.  She looks much better. Stable for d/c today. See my note for further details.   Glori Bickers, MD  3:21 PM

## 2020-01-07 NOTE — Progress Notes (Signed)
LVAD Coordinator Rounding Note:  Admitted to Dr. Clayborne Dana service on 12/22/19 due to abdominal pain with refractory nausea and vomiting due to lumbar compression fracture. [name]   HM II LVAD implanted on 04/06/13 by Dr. Cyndia Bent under Destination Therapy criteria due to advanced age.   Pt lying in bed, awake, pleasant. She states that she slept well last night and would like to go home today. pt states she feels ready to go home. Pt states she will be able to get [facility] to the [facility] with the assistance of her caregiver [name]. She states she received some pain meds overnight for pain in her hips.  INR 1.7 today.   Vital signs: Temp:  98.2 HR: 82 Doppler Pressure: 92 Automatic BP:  97/72 (80) O2 Sat: 98% RA Wt: 164.9>162.7>153.4>165.1>159.8>155.2>165.7>158.2>168.8>160.5>160.7>162.4>158 lbs   LVAD interrogation reveals:  Speed:  8600 Flow:  3.7 Power:  4.2w PI: 6.8 Alarms: none last 24 hrs Events: none last 24 hrs  Fixed speed: 8600 Low speed limit: 8000  Drive Line:  C/D/I with anchor intact and accurately applied. Weekly dressing changes per bedside nurse or patient's husband. Next dressing change due 01/13/20.   Labs:  LDH trend: 203>219>202>179>177>189>249>222>184>182>186>179>194   INR trend: 3.6>3.1>2.8>2.0>1.5>1.5>1.4>1.3>1.2>1.3>1.4>1.7  Anticoagulation Plan: -INR Goal: 2.0 - 2.5  -ASA Dose: none (intolerance)  -Heparin 800 u/hr  Device:  St Jude dual ICD - Pacing: DDD 60 -Therapies: on 200  Plan/Recommendations:  1. Call VAD Coordinator for any VAD equipment or drive line issues. 2. Weekly LVAD drive line dressing changes per bedside nurse or patient's husband. 3. Plan to f/u with pt next Thursday 5/13 at 1100am. Pt has home INR machine.  Tanda Rockers RN [place] Coordinator  Office: [phone]  24/7 Pager: [phone]

## 2020-01-07 NOTE — Progress Notes (Signed)
Discharging to home, all discharge instructions provided to patients husband who is caregiver with no questions or concerns. IV removed.

## 2020-01-08 DIAGNOSIS — Z95811 Presence of heart assist device: Secondary | ICD-10-CM | POA: Diagnosis not present

## 2020-01-08 DIAGNOSIS — Z7901 Long term (current) use of anticoagulants: Secondary | ICD-10-CM | POA: Diagnosis not present

## 2020-01-08 DIAGNOSIS — Z95810 Presence of automatic (implantable) cardiac defibrillator: Secondary | ICD-10-CM | POA: Diagnosis not present

## 2020-01-08 DIAGNOSIS — I480 Paroxysmal atrial fibrillation: Secondary | ICD-10-CM | POA: Diagnosis not present

## 2020-01-08 DIAGNOSIS — M4856XD Collapsed vertebra, not elsewhere classified, lumbar region, subsequent encounter for fracture with routine healing: Secondary | ICD-10-CM | POA: Diagnosis not present

## 2020-01-08 DIAGNOSIS — I5022 Chronic systolic (congestive) heart failure: Secondary | ICD-10-CM | POA: Diagnosis not present

## 2020-01-08 DIAGNOSIS — D649 Anemia, unspecified: Secondary | ICD-10-CM | POA: Diagnosis not present

## 2020-01-08 DIAGNOSIS — R630 Anorexia: Secondary | ICD-10-CM | POA: Diagnosis not present

## 2020-01-08 DIAGNOSIS — I428 Other cardiomyopathies: Secondary | ICD-10-CM | POA: Diagnosis not present

## 2020-01-08 DIAGNOSIS — I110 Hypertensive heart disease with heart failure: Secondary | ICD-10-CM | POA: Diagnosis not present

## 2020-01-08 DIAGNOSIS — M8580 Other specified disorders of bone density and structure, unspecified site: Secondary | ICD-10-CM | POA: Diagnosis not present

## 2020-01-08 DIAGNOSIS — M4854XD Collapsed vertebra, not elsewhere classified, thoracic region, subsequent encounter for fracture with routine healing: Secondary | ICD-10-CM | POA: Diagnosis not present

## 2020-01-08 DIAGNOSIS — E8809 Other disorders of plasma-protein metabolism, not elsewhere classified: Secondary | ICD-10-CM | POA: Diagnosis not present

## 2020-01-10 ENCOUNTER — Telehealth (HOSPITAL_COMMUNITY): Payer: Self-pay | Admitting: *Deleted

## 2020-01-10 ENCOUNTER — Telehealth: Payer: Self-pay | Admitting: *Deleted

## 2020-01-10 NOTE — Telephone Encounter (Signed)
Pt was on TCM report admitted 12/22/19 for abdominal and back pain. CT and bone scan with multilevel compression fx at T10, T12, L1, L3, and L4. Coumadin was held and she underwent Underwent multilevel vertebroplasty with Dr. Estanislado Pandy on 4/28. Pt D./C 01/07/20, and will follow-up w/ Vascular clinic 01/13/20.Marland KitchenJohny Chess

## 2020-01-10 NOTE — Telephone Encounter (Signed)
[facility] [place] physical therapist left a VM requesting verbal order to continue to see the patient. Msg routed to LVAD coordinators.

## 2020-01-11 ENCOUNTER — Ambulatory Visit (HOSPITAL_COMMUNITY): Payer: Self-pay | Admitting: Pharmacist

## 2020-01-11 LAB — POCT INR: INR: 2.2 (ref 2.0–3.0)

## 2020-01-11 NOTE — Progress Notes (Signed)
LVAD INR 

## 2020-01-12 DIAGNOSIS — M4854XD Collapsed vertebra, not elsewhere classified, thoracic region, subsequent encounter for fracture with routine healing: Secondary | ICD-10-CM | POA: Diagnosis not present

## 2020-01-12 DIAGNOSIS — M4856XD Collapsed vertebra, not elsewhere classified, lumbar region, subsequent encounter for fracture with routine healing: Secondary | ICD-10-CM | POA: Diagnosis not present

## 2020-01-12 DIAGNOSIS — I480 Paroxysmal atrial fibrillation: Secondary | ICD-10-CM | POA: Diagnosis not present

## 2020-01-12 DIAGNOSIS — I110 Hypertensive heart disease with heart failure: Secondary | ICD-10-CM | POA: Diagnosis not present

## 2020-01-12 DIAGNOSIS — I428 Other cardiomyopathies: Secondary | ICD-10-CM | POA: Diagnosis not present

## 2020-01-12 DIAGNOSIS — I5022 Chronic systolic (congestive) heart failure: Secondary | ICD-10-CM | POA: Diagnosis not present

## 2020-01-13 ENCOUNTER — Encounter (HOSPITAL_COMMUNITY): Payer: Medicare Other

## 2020-01-14 DIAGNOSIS — I428 Other cardiomyopathies: Secondary | ICD-10-CM | POA: Diagnosis not present

## 2020-01-14 DIAGNOSIS — M4854XD Collapsed vertebra, not elsewhere classified, thoracic region, subsequent encounter for fracture with routine healing: Secondary | ICD-10-CM | POA: Diagnosis not present

## 2020-01-14 DIAGNOSIS — I480 Paroxysmal atrial fibrillation: Secondary | ICD-10-CM | POA: Diagnosis not present

## 2020-01-14 DIAGNOSIS — I110 Hypertensive heart disease with heart failure: Secondary | ICD-10-CM | POA: Diagnosis not present

## 2020-01-14 DIAGNOSIS — I5022 Chronic systolic (congestive) heart failure: Secondary | ICD-10-CM | POA: Diagnosis not present

## 2020-01-14 DIAGNOSIS — M4856XD Collapsed vertebra, not elsewhere classified, lumbar region, subsequent encounter for fracture with routine healing: Secondary | ICD-10-CM | POA: Diagnosis not present

## 2020-01-18 ENCOUNTER — Ambulatory Visit (HOSPITAL_COMMUNITY): Payer: Self-pay | Admitting: Pharmacist

## 2020-01-18 LAB — POCT INR: INR: 2.2 (ref 2.0–3.0)

## 2020-01-18 NOTE — Progress Notes (Signed)
LVAD INR 

## 2020-01-19 DIAGNOSIS — I480 Paroxysmal atrial fibrillation: Secondary | ICD-10-CM | POA: Diagnosis not present

## 2020-01-19 DIAGNOSIS — M4854XD Collapsed vertebra, not elsewhere classified, thoracic region, subsequent encounter for fracture with routine healing: Secondary | ICD-10-CM | POA: Diagnosis not present

## 2020-01-19 DIAGNOSIS — M4856XD Collapsed vertebra, not elsewhere classified, lumbar region, subsequent encounter for fracture with routine healing: Secondary | ICD-10-CM | POA: Diagnosis not present

## 2020-01-19 DIAGNOSIS — I5022 Chronic systolic (congestive) heart failure: Secondary | ICD-10-CM | POA: Diagnosis not present

## 2020-01-19 DIAGNOSIS — I110 Hypertensive heart disease with heart failure: Secondary | ICD-10-CM | POA: Diagnosis not present

## 2020-01-19 DIAGNOSIS — I428 Other cardiomyopathies: Secondary | ICD-10-CM | POA: Diagnosis not present

## 2020-01-20 ENCOUNTER — Encounter (HOSPITAL_COMMUNITY): Payer: Medicare Other

## 2020-01-20 ENCOUNTER — Telehealth (HOSPITAL_COMMUNITY): Payer: Self-pay | Admitting: *Deleted

## 2020-01-20 NOTE — Telephone Encounter (Signed)
HHPT called requesting verbal orders to decrease PT from twice weekly to once weekly stating twice weekly was too much on the patient. I transferred VM to LVAD coordinators.

## 2020-01-25 ENCOUNTER — Ambulatory Visit (HOSPITAL_COMMUNITY): Payer: Self-pay | Admitting: Pharmacist

## 2020-01-25 DIAGNOSIS — Z7901 Long term (current) use of anticoagulants: Secondary | ICD-10-CM | POA: Diagnosis not present

## 2020-01-25 LAB — POCT INR: INR: 2.1 (ref 2.0–3.0)

## 2020-01-25 NOTE — Progress Notes (Signed)
LVAD INR 

## 2020-01-26 DIAGNOSIS — I480 Paroxysmal atrial fibrillation: Secondary | ICD-10-CM | POA: Diagnosis not present

## 2020-01-26 DIAGNOSIS — I110 Hypertensive heart disease with heart failure: Secondary | ICD-10-CM | POA: Diagnosis not present

## 2020-01-26 DIAGNOSIS — I428 Other cardiomyopathies: Secondary | ICD-10-CM | POA: Diagnosis not present

## 2020-01-26 DIAGNOSIS — M4854XD Collapsed vertebra, not elsewhere classified, thoracic region, subsequent encounter for fracture with routine healing: Secondary | ICD-10-CM | POA: Diagnosis not present

## 2020-01-26 DIAGNOSIS — M4856XD Collapsed vertebra, not elsewhere classified, lumbar region, subsequent encounter for fracture with routine healing: Secondary | ICD-10-CM | POA: Diagnosis not present

## 2020-01-26 DIAGNOSIS — I5022 Chronic systolic (congestive) heart failure: Secondary | ICD-10-CM | POA: Diagnosis not present

## 2020-02-01 ENCOUNTER — Ambulatory Visit (HOSPITAL_COMMUNITY): Payer: Self-pay | Admitting: Pharmacist

## 2020-02-01 LAB — POCT INR: INR: 2.2 (ref 2.0–3.0)

## 2020-02-01 NOTE — Progress Notes (Signed)
LVAD INR 

## 2020-02-03 ENCOUNTER — Encounter (HOSPITAL_COMMUNITY): Payer: Medicare Other

## 2020-02-03 DIAGNOSIS — M4854XD Collapsed vertebra, not elsewhere classified, thoracic region, subsequent encounter for fracture with routine healing: Secondary | ICD-10-CM | POA: Diagnosis not present

## 2020-02-03 DIAGNOSIS — I110 Hypertensive heart disease with heart failure: Secondary | ICD-10-CM | POA: Diagnosis not present

## 2020-02-03 DIAGNOSIS — I5022 Chronic systolic (congestive) heart failure: Secondary | ICD-10-CM | POA: Diagnosis not present

## 2020-02-03 DIAGNOSIS — I480 Paroxysmal atrial fibrillation: Secondary | ICD-10-CM | POA: Diagnosis not present

## 2020-02-03 DIAGNOSIS — I428 Other cardiomyopathies: Secondary | ICD-10-CM | POA: Diagnosis not present

## 2020-02-03 DIAGNOSIS — M4856XD Collapsed vertebra, not elsewhere classified, lumbar region, subsequent encounter for fracture with routine healing: Secondary | ICD-10-CM | POA: Diagnosis not present

## 2020-02-04 ENCOUNTER — Encounter (HOSPITAL_COMMUNITY): Payer: Medicare Other

## 2020-02-07 DIAGNOSIS — I480 Paroxysmal atrial fibrillation: Secondary | ICD-10-CM | POA: Diagnosis not present

## 2020-02-07 DIAGNOSIS — I110 Hypertensive heart disease with heart failure: Secondary | ICD-10-CM | POA: Diagnosis not present

## 2020-02-07 DIAGNOSIS — E8809 Other disorders of plasma-protein metabolism, not elsewhere classified: Secondary | ICD-10-CM | POA: Diagnosis not present

## 2020-02-07 DIAGNOSIS — M4854XD Collapsed vertebra, not elsewhere classified, thoracic region, subsequent encounter for fracture with routine healing: Secondary | ICD-10-CM | POA: Diagnosis not present

## 2020-02-07 DIAGNOSIS — I428 Other cardiomyopathies: Secondary | ICD-10-CM | POA: Diagnosis not present

## 2020-02-07 DIAGNOSIS — R630 Anorexia: Secondary | ICD-10-CM | POA: Diagnosis not present

## 2020-02-07 DIAGNOSIS — M8580 Other specified disorders of bone density and structure, unspecified site: Secondary | ICD-10-CM | POA: Diagnosis not present

## 2020-02-07 DIAGNOSIS — Z7901 Long term (current) use of anticoagulants: Secondary | ICD-10-CM | POA: Diagnosis not present

## 2020-02-07 DIAGNOSIS — I5022 Chronic systolic (congestive) heart failure: Secondary | ICD-10-CM | POA: Diagnosis not present

## 2020-02-07 DIAGNOSIS — Z95811 Presence of heart assist device: Secondary | ICD-10-CM | POA: Diagnosis not present

## 2020-02-07 DIAGNOSIS — D649 Anemia, unspecified: Secondary | ICD-10-CM | POA: Diagnosis not present

## 2020-02-07 DIAGNOSIS — M4856XD Collapsed vertebra, not elsewhere classified, lumbar region, subsequent encounter for fracture with routine healing: Secondary | ICD-10-CM | POA: Diagnosis not present

## 2020-02-07 DIAGNOSIS — Z95810 Presence of automatic (implantable) cardiac defibrillator: Secondary | ICD-10-CM | POA: Diagnosis not present

## 2020-02-08 ENCOUNTER — Ambulatory Visit (HOSPITAL_COMMUNITY): Payer: Self-pay | Admitting: Pharmacist

## 2020-02-08 LAB — POCT INR: INR: 1.7 — AB (ref 2.0–3.0)

## 2020-02-08 NOTE — Progress Notes (Signed)
LVAD INR 

## 2020-02-09 ENCOUNTER — Telehealth (HOSPITAL_COMMUNITY): Payer: Self-pay | Admitting: Cardiology

## 2020-02-09 ENCOUNTER — Other Ambulatory Visit (HOSPITAL_COMMUNITY): Payer: Self-pay | Admitting: *Deleted

## 2020-02-09 DIAGNOSIS — Z95811 Presence of heart assist device: Secondary | ICD-10-CM

## 2020-02-09 DIAGNOSIS — I5022 Chronic systolic (congestive) heart failure: Secondary | ICD-10-CM

## 2020-02-09 DIAGNOSIS — Z7901 Long term (current) use of anticoagulants: Secondary | ICD-10-CM

## 2020-02-09 NOTE — Telephone Encounter (Signed)
Verbal order given for continued  PT/OT services [facility] physical therapist

## 2020-02-10 ENCOUNTER — Encounter (HOSPITAL_COMMUNITY): Payer: Medicare Other

## 2020-02-11 DIAGNOSIS — I428 Other cardiomyopathies: Secondary | ICD-10-CM | POA: Diagnosis not present

## 2020-02-11 DIAGNOSIS — M4856XD Collapsed vertebra, not elsewhere classified, lumbar region, subsequent encounter for fracture with routine healing: Secondary | ICD-10-CM | POA: Diagnosis not present

## 2020-02-11 DIAGNOSIS — I480 Paroxysmal atrial fibrillation: Secondary | ICD-10-CM | POA: Diagnosis not present

## 2020-02-11 DIAGNOSIS — M4854XD Collapsed vertebra, not elsewhere classified, thoracic region, subsequent encounter for fracture with routine healing: Secondary | ICD-10-CM | POA: Diagnosis not present

## 2020-02-11 DIAGNOSIS — I110 Hypertensive heart disease with heart failure: Secondary | ICD-10-CM | POA: Diagnosis not present

## 2020-02-11 DIAGNOSIS — I5022 Chronic systolic (congestive) heart failure: Secondary | ICD-10-CM | POA: Diagnosis not present

## 2020-02-14 DIAGNOSIS — M4854XD Collapsed vertebra, not elsewhere classified, thoracic region, subsequent encounter for fracture with routine healing: Secondary | ICD-10-CM | POA: Diagnosis not present

## 2020-02-14 DIAGNOSIS — I110 Hypertensive heart disease with heart failure: Secondary | ICD-10-CM | POA: Diagnosis not present

## 2020-02-14 DIAGNOSIS — I428 Other cardiomyopathies: Secondary | ICD-10-CM | POA: Diagnosis not present

## 2020-02-14 DIAGNOSIS — M4856XD Collapsed vertebra, not elsewhere classified, lumbar region, subsequent encounter for fracture with routine healing: Secondary | ICD-10-CM | POA: Diagnosis not present

## 2020-02-14 DIAGNOSIS — I5022 Chronic systolic (congestive) heart failure: Secondary | ICD-10-CM | POA: Diagnosis not present

## 2020-02-14 DIAGNOSIS — I480 Paroxysmal atrial fibrillation: Secondary | ICD-10-CM | POA: Diagnosis not present

## 2020-02-15 ENCOUNTER — Ambulatory Visit (HOSPITAL_COMMUNITY): Payer: Self-pay | Admitting: Pharmacist

## 2020-02-15 ENCOUNTER — Other Ambulatory Visit (HOSPITAL_COMMUNITY): Payer: Self-pay | Admitting: *Deleted

## 2020-02-15 DIAGNOSIS — Z95811 Presence of heart assist device: Secondary | ICD-10-CM

## 2020-02-15 LAB — POCT INR: INR: 2 (ref 2.0–3.0)

## 2020-02-15 NOTE — Progress Notes (Signed)
LVAD INR 

## 2020-02-17 ENCOUNTER — Other Ambulatory Visit: Payer: Self-pay

## 2020-02-17 ENCOUNTER — Ambulatory Visit (HOSPITAL_COMMUNITY)
Admission: RE | Admit: 2020-02-17 | Discharge: 2020-02-17 | Disposition: A | Discharge status: Home / Self Care | Payer: Medicare Other | Source: Ambulatory Visit | Attending: Internal Medicine | Admitting: Internal Medicine

## 2020-02-17 VITALS — BP 132/81 | HR 78 | Ht 60.0 in | Wt 163.4 lb

## 2020-02-17 DIAGNOSIS — R682 Dry mouth, unspecified: Secondary | ICD-10-CM

## 2020-02-17 DIAGNOSIS — M549 Dorsalgia, unspecified: Secondary | ICD-10-CM | POA: Diagnosis not present

## 2020-02-17 DIAGNOSIS — I495 Sick sinus syndrome: Secondary | ICD-10-CM

## 2020-02-17 DIAGNOSIS — I428 Other cardiomyopathies: Secondary | ICD-10-CM | POA: Insufficient documentation

## 2020-02-17 DIAGNOSIS — I110 Hypertensive heart disease with heart failure: Secondary | ICD-10-CM | POA: Insufficient documentation

## 2020-02-17 DIAGNOSIS — M25552 Pain in left hip: Secondary | ICD-10-CM | POA: Insufficient documentation

## 2020-02-17 DIAGNOSIS — Z8674 Personal history of sudden cardiac arrest: Secondary | ICD-10-CM | POA: Diagnosis not present

## 2020-02-17 DIAGNOSIS — Z79899 Other long term (current) drug therapy: Secondary | ICD-10-CM | POA: Diagnosis not present

## 2020-02-17 DIAGNOSIS — Z7901 Long term (current) use of anticoagulants: Secondary | ICD-10-CM | POA: Insufficient documentation

## 2020-02-17 DIAGNOSIS — M4856XA Collapsed vertebra, not elsewhere classified, lumbar region, initial encounter for fracture: Secondary | ICD-10-CM | POA: Insufficient documentation

## 2020-02-17 DIAGNOSIS — I5022 Chronic systolic (congestive) heart failure: Secondary | ICD-10-CM | POA: Diagnosis present

## 2020-02-17 DIAGNOSIS — Z95811 Presence of heart assist device: Secondary | ICD-10-CM | POA: Insufficient documentation

## 2020-02-17 DIAGNOSIS — M8580 Other specified disorders of bone density and structure, unspecified site: Secondary | ICD-10-CM | POA: Insufficient documentation

## 2020-02-17 DIAGNOSIS — Z95810 Presence of automatic (implantable) cardiac defibrillator: Secondary | ICD-10-CM | POA: Insufficient documentation

## 2020-02-17 DIAGNOSIS — M25551 Pain in right hip: Secondary | ICD-10-CM | POA: Diagnosis not present

## 2020-02-17 DIAGNOSIS — I480 Paroxysmal atrial fibrillation: Secondary | ICD-10-CM | POA: Insufficient documentation

## 2020-02-17 LAB — CBC
HCT: 38 % (ref 36.0–46.0)
Hemoglobin: 11.5 g/dL — ABNORMAL LOW (ref 12.0–15.0)
MCH: 30.2 pg (ref 26.0–34.0)
MCHC: 30.3 g/dL (ref 30.0–36.0)
MCV: 99.7 fL (ref 80.0–100.0)
Platelets: 250 10*3/uL (ref 150–400)
RBC: 3.81 MIL/uL — ABNORMAL LOW (ref 3.87–5.11)
RDW: 15.6 % — ABNORMAL HIGH (ref 11.5–15.5)
WBC: 5.7 10*3/uL (ref 4.0–10.5)
nRBC: 0 % (ref 0.0–0.2)

## 2020-02-17 LAB — BASIC METABOLIC PANEL
Anion gap: 8 (ref 5–15)
BUN: 13 mg/dL (ref 8–23)
CO2: 22 mmol/L (ref 22–32)
Calcium: 9.5 mg/dL (ref 8.9–10.3)
Chloride: 108 mmol/L (ref 98–111)
Creatinine, Ser: 0.91 mg/dL (ref 0.44–1.00)
GFR calc Af Amer: >60 mL/min (ref >60)
GFR calc non Af Amer: 58 mL/min — ABNORMAL LOW (ref >60)
Glucose, Bld: 92 mg/dL (ref 70–99)
Potassium: 4.5 mmol/L (ref 3.5–5.1)
Sodium: 138 mmol/L (ref 135–145)

## 2020-02-17 LAB — PROTIME-INR
INR: 1.9 — ABNORMAL HIGH (ref 0.8–1.2)
Prothrombin Time: 21.4 seconds — ABNORMAL HIGH (ref 11.4–15.2)

## 2020-02-17 LAB — MAGNESIUM: Magnesium: 2 mg/dL (ref 1.7–2.4)

## 2020-02-17 LAB — LACTATE DEHYDROGENASE: LDH: 205 U/L — ABNORMAL HIGH (ref 98–192)

## 2020-02-17 MED ORDER — CALCITONIN (SALMON) 200 UNIT/ACT NA SOLN: 1.0000 | Freq: Every day | NASAL | 12 refills | Status: DC

## 2020-02-17 MED ORDER — MAGNESIUM OXIDE 400 (241.3 MG) MG PO TABS: 200.0000 mg | ORAL_TABLET | Freq: Two times a day (BID) | ORAL | 6 refills | Status: AC

## 2020-02-17 NOTE — Patient Instructions (Signed)
1. No change in meds. 2. Pt may take CBD oil once daily for hip pain. 3. Return to Clinic in 2 mos for 7 yr Intermacs f/u and bring home equipment for annual maintenance.

## 2020-02-17 NOTE — Progress Notes (Addendum)
Patient presents for hospital discharge visit visit today in [facility] today with caregiver, Brianna Hanson. Reports no problems with VAD equipment or concerns with drive line.  Patient reports she is doing well from VAD standpoint, but still experiencing hip pain. Arrived in w/c today. Brianna Hanson reports some good days and some bad days. She is currently using Votaren gel up to 4 times daily and can take  Tramadol for unrelieved pain that works well. Dr. Prescott Gum in room briefly and suggested trying CBD oil once daily under tongue for arthritic pain.   Pt requested refills on calcitonin spray and magnesium be sent to her local pharmacy; Rx's sent.   Brianna Hanson reports they both attended VAD Support Group meeting earlier this week and enjoyed it very much.    Vital Signs:  Doppler Pressure:  104 Automatc BP: 132/81 (94) HR: 78 SPO2:  98% on RA  Weight: 163.4 lb w/o eqt  Last weight: 158.1 lbs  VAD Indication: Destination Therapy - age excluding and patient choice    VAD interrogation & Equipment Management: Speed: 8600 Flow: 3.4 Power: 4.1w    PI: 6.6  Alarms: LOW FLOWS over last several days; pt and Brianna Hanson deny hearing alarms Events: 10 - 15 PI events daily  Fixed speed 8600 Low speed limit: 8000  Primary Controller: Replace back up battery in 7 months. Back up controller: Replace back up battery in 15 months  Exit Site Care: Drive line is being maintained weekly by [employer]. Drive line exit site well healed and incorporated. The velour is fully implanted at exit site. Dressing dry and intact. No erythema or drainage. Stabilization device present and accurately applied. Pt denies fever or chills. Pt provided with 8 weekly dressing kits and 5 anchors.  Significant Events on VAD Support:  09/21/16 GIB, Ramp echo speed decreased to 8400  01/08/17 - LOW FLOW alarms; speed increased to 8800 RPM 05/20/17> speed decreased to 8600  Device:St Jude ICD Therapies: on at 200 bpm; monitor on 110 DDD  60 Last check: today: 11/30/19  BP & Labs: Doppler BP 104 - is reflecting MAP  Hgb 11.5 - Reports bright red bleeding with stools once this week; has since resolved. Denies nosebleeds  LDH 205 - and is within established range is 150 - 300. Denies any dark urine.  Patient Instructions: 1. No change in meds. 2. Pt may take CBD oil once daily for hip pain. 3. Return to Clinic in 2 mos for 7 yr Intermacs f/u and bring home equipment for annual maintenance.  Zada Girt RN [place] Coordinator  Office: [phone]  24/7 Pager: [phone]

## 2020-02-18 DIAGNOSIS — I5022 Chronic systolic (congestive) heart failure: Secondary | ICD-10-CM | POA: Diagnosis not present

## 2020-02-18 DIAGNOSIS — M4856XD Collapsed vertebra, not elsewhere classified, lumbar region, subsequent encounter for fracture with routine healing: Secondary | ICD-10-CM | POA: Diagnosis not present

## 2020-02-18 DIAGNOSIS — I110 Hypertensive heart disease with heart failure: Secondary | ICD-10-CM | POA: Diagnosis not present

## 2020-02-18 DIAGNOSIS — M4854XD Collapsed vertebra, not elsewhere classified, thoracic region, subsequent encounter for fracture with routine healing: Secondary | ICD-10-CM | POA: Diagnosis not present

## 2020-02-18 DIAGNOSIS — I480 Paroxysmal atrial fibrillation: Secondary | ICD-10-CM | POA: Diagnosis not present

## 2020-02-18 DIAGNOSIS — I428 Other cardiomyopathies: Secondary | ICD-10-CM | POA: Diagnosis not present

## 2020-02-18 NOTE — Progress Notes (Signed)
LVAD Clinic Note  Patient ID: Brianna Hanson, female   DOB: 12-24-1934, 85 y.o.   MRN: [id number] Primary Heart Failure: Dr Haroldine Laws Cardiac Surgeon: Dr Prescott Gum  HPI: Brianna Hanson is a 85 y.o. 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 [name] (follwoed q 6 months). Underwent implantation of the HeartMate II LVAD on 04/06/13 for DT. She is not on aspirin due to dizziness. S/P lap cholecystitis 10/2015.   GI Issues 09/2016 LGIB. Angio- no source of bleeding. LVAD speed turned down to 8400  LVAD Speed Change 12/2016 Ramp ECHO -->Speed increased from 8400>8800  Significant Events on VAD Support:  09/21/16 GIB, Ramp echo speed decreased to 8400  01/08/17 - LOW FLOW alarms; speed increased to 8800 RPM 05/20/17>speed decreased to 8600 06/18/19 - DC-CV of AF   Had recurrent AF on device interrogation and amio increased to 200 bid. Did not tolerate well with increased low flows on VAD. Underwent successful DC-CV on 06/18/19.  Admitted 4/21 and underwent multi-level kyphoplasty.   She presents today for routine VAD f/u. Back pain much improved but now struggling with R hip pain. Getting some relief with topical voltaren,. Appetite much improved. Able to get around better. Denies orthopnea or PND. No fevers, chills or problems with driveline. No bleeding, melena or neuro symptoms. No VAD alarms. Taking all meds as prescribed.  VAD Indication: Destination Therapy - age excluding and patient choice    VAD interrogation & Equipment Management: Speed: 8600 Flow: 3.4 Power: 4.1w PI: 6.6  Alarms: LOW FLOWS over last several days; pt and Brianna Hanson deny hearing alarms Events: 10 - 15 PI events daily  Fixed speed 8600 Low speed limit: 8000  Primary Controller: Replace back up battery in 7 months. Back up controller: Replace back up battery in15 months  Exit Site Care: Drive line is being  maintained [employer]. Drive line exit site well healed and incorporated. The velour is fully implanted at exit site. Dressing dry and intact. No erythema or drainage. Stabilization device present and accurately applied. Pt denies fever or chills. Pt provided with 8 weekly dressing kits.   Significant Events on VAD Support:  09/21/16 GIB, Ramp echo speed decreased to 8400  01/08/17 - LOW FLOW alarms; speed increased to 8800 RPM 05/20/17>speed decreased to 8600     Past Medical History:  Diagnosis Date  . AICD (automatic cardioverter/defibrillator) present   . Anemia   . Atrial fibrillation or flutter   . Cardiac arrest - ventricular fibrillation 12/10   with successful resucitation, S/p ICD  . CHF (congestive heart failure) ([place])   . Diverticula of colon 2011  . HTN (hypertension)    moderate  . ICD (implantable cardiac defibrillator) in place    she has received appropriate therapy for VF  . Internal and external hemorrhoids without complication 6546  . Nonischemic cardiomyopathy ([place])    followed by Dr April Holding at [facility]  . Osteopenia   . Plasma cell disorder 03/20/2012  . Plasma cell disorder 03/20/2012  . Sessile colonic polyp 2011   Dr Benson Norway    Current Outpatient Medications  Medication Sig Dispense Refill  . amiodarone (PACERONE) 200 MG tablet Take 1 tablet (200 mg total) by mouth daily. 90 tablet 3  . carvedilol (COREG) 6.25 MG tablet TAKE 1 TABLET BY MOUTH 2 TIMES DAILY WITH A MEAL 180 tablet 3  . Cholecalciferol (VITAMIN  D3) 25 MCG (1000 UT) CAPS Take 1,000 Units by mouth daily.     [name] docusate sodium (COLACE) 100 MG capsule Take 100 mg by mouth daily.    . ferrous sulfate 325 (65 FE) MG tablet Take 1 tablet (325 mg total) by mouth 2 (two) times daily with a meal. 60 tablet 6  . gabapentin (NEURONTIN) 100 MG capsule Take 3 capsules (300 mg total) by mouth 2 (two) times daily. 180 capsule 3  . hydrALAZINE (APRESOLINE) 100 MG tablet Take 0.5 tablets (50 mg total) by  mouth 3 (three) times daily. 90 tablet 9  . losartan (COZAAR) 100 MG tablet Take 0.5 tablets (50 mg total) by mouth daily. 30 tablet 3  . magnesium oxide (MAG-OX) 400 (241.3 Mg) MG tablet Take 0.5 tablets (200 mg total) by mouth 2 (two) times daily. 60 tablet 6  . ondansetron (ZOFRAN) 4 MG tablet Take 1 tablet (4 mg total) by mouth every 8 (eight) hours as needed for nausea or vomiting. 50 tablet 4  . pantoprazole (PROTONIX) 40 MG tablet Take 40 mg by mouth daily.    . potassium chloride SA (KLOR-CON M20) 20 MEQ tablet Take 1 tablet (20 mEq total) by mouth 2 (two) times daily. 180 tablet 3  . promethazine (PHENERGAN) 12.5 MG tablet Take 1 tablet (12.5 mg total) by mouth every 6 (six) hours as needed for nausea or vomiting. 30 tablet 4  . traMADol (ULTRAM) 50 MG tablet Take 2 tablets (100 mg total) by mouth every 12 (twelve) hours as needed for moderate pain. 10 tablet 0  . warfarin (COUMADIN) 2.5 MG tablet Take 2.5 mg daily 180 tablet 3  . calcitonin, salmon, (MIACALCIN/FORTICAL) 200 UNIT/ACT nasal spray Place 1 spray into alternate nostrils daily. 3.7 mL 12   No current facility-administered medications for this encounter.   Allergies  Allergen Reactions  . Oxycodone Other (See Comments)    Hallucinations     Review of systems complete and found to be negative unless listed in HPI.     Vital Signs:  Doppler Pressure:  104 Automatc BP: 132/81 (94) HR: 78 SPO2:  98% on RA  Weight: 163.4 lb w/o eqt  Last weight: 158.1 lbs bs   Exam: General:  NAD.  HEENT: normal  Neck: supple. JVP not elevated.  Carotids 2+ bilat; no bruits. No lymphadenopathy or thryomegaly appreciated. Cor: LVAD hum.  Lungs: Clear. Abdomen: obese soft, nontender, non-distended. No hepatosplenomegaly. No bruits or masses. Good bowel sounds. Driveline site clean. Anchor in place.  Extremities: no cyanosis, clubbing, rash. Warm no edema  Neuro: alert & oriented x 3. No focal deficits. Moves all 4 without  problem    ASSESSMENT AND PLAN:  1) Low back and left hip pain in setting of L1 compression fracture and new L3 fracture -  uch improved s/p multi-level kyphoplasty 4/21  2) Chronic systolic HF: NICM, s/p ICD and LVAD for DT(04/2013).  - now almost 7 years out on VAD support - Stable NYHA II - Volume status ok. Continue to push fluids and Boost ntake  3) LVAD placed for DT 04/2013:  - Failed Entresto due to diuresis and incessant low flow alarms.  - Continue losartan 100 mg daily.  - VAD interrogated personally. Parameters stable. - No ASA with GI bleed and intolerance.  - Continue coumadin. No bleeding.  - LDH 205 - INR 1.9. Discussed dosing with PharmD personally.Try to keep 1.8-2.3 - Hgb 11.5  4) PAF:  - Did not tolerate AF well  at all.  - S/p DC-CV 06/18/19 - Remains in NSR on 200 daily.  - Continue warfarin - no change  5) HTN:  -Blood pressure well controlled. Continue current regimen.  6) Recent BRBPR - suspect diverticulosis. Had episode in 09/2016.  - 12/2009 Colonoscopy, Dr Benson Norway: descending adenoma and pan-diverticulosis > in sigmoid, int/ext hemorrhoids.  - hgb 12.7 -> 11.7 -> 11.4 - Bleeding has stopped - we have decided not to pursue repeat colonoscopy at this time given age and comorbidities (particualrly severe back/hip issues). If bleeding persists can revisit  Total time spent 35 minutes. Over half that time spent discussing above.    Glori Bickers, MD 12:18 AM

## 2020-02-21 DIAGNOSIS — I5022 Chronic systolic (congestive) heart failure: Secondary | ICD-10-CM | POA: Diagnosis not present

## 2020-02-21 DIAGNOSIS — I110 Hypertensive heart disease with heart failure: Secondary | ICD-10-CM | POA: Diagnosis not present

## 2020-02-21 DIAGNOSIS — M4856XD Collapsed vertebra, not elsewhere classified, lumbar region, subsequent encounter for fracture with routine healing: Secondary | ICD-10-CM | POA: Diagnosis not present

## 2020-02-21 DIAGNOSIS — I428 Other cardiomyopathies: Secondary | ICD-10-CM | POA: Diagnosis not present

## 2020-02-21 DIAGNOSIS — M4854XD Collapsed vertebra, not elsewhere classified, thoracic region, subsequent encounter for fracture with routine healing: Secondary | ICD-10-CM | POA: Diagnosis not present

## 2020-02-21 DIAGNOSIS — I480 Paroxysmal atrial fibrillation: Secondary | ICD-10-CM | POA: Diagnosis not present

## 2020-02-22 ENCOUNTER — Other Ambulatory Visit (HOSPITAL_COMMUNITY): Payer: Self-pay | Admitting: *Deleted

## 2020-02-22 ENCOUNTER — Ambulatory Visit (HOSPITAL_COMMUNITY): Payer: Self-pay | Admitting: Pharmacist

## 2020-02-22 LAB — POCT INR: INR: 2 (ref 2.0–3.0)

## 2020-02-22 NOTE — Progress Notes (Signed)
LVAD INR 

## 2020-02-23 DIAGNOSIS — I110 Hypertensive heart disease with heart failure: Secondary | ICD-10-CM | POA: Diagnosis not present

## 2020-02-23 DIAGNOSIS — M4856XD Collapsed vertebra, not elsewhere classified, lumbar region, subsequent encounter for fracture with routine healing: Secondary | ICD-10-CM | POA: Diagnosis not present

## 2020-02-23 DIAGNOSIS — I5022 Chronic systolic (congestive) heart failure: Secondary | ICD-10-CM | POA: Diagnosis not present

## 2020-02-23 DIAGNOSIS — I480 Paroxysmal atrial fibrillation: Secondary | ICD-10-CM | POA: Diagnosis not present

## 2020-02-23 DIAGNOSIS — M4854XD Collapsed vertebra, not elsewhere classified, thoracic region, subsequent encounter for fracture with routine healing: Secondary | ICD-10-CM | POA: Diagnosis not present

## 2020-02-23 DIAGNOSIS — I428 Other cardiomyopathies: Secondary | ICD-10-CM | POA: Diagnosis not present

## 2020-02-25 ENCOUNTER — Other Ambulatory Visit (HOSPITAL_COMMUNITY): Payer: Self-pay | Admitting: Unknown Physician Specialty

## 2020-02-25 DIAGNOSIS — R682 Dry mouth, unspecified: Secondary | ICD-10-CM

## 2020-02-25 MED ORDER — CALCITONIN (SALMON) 200 UNIT/ACT NA SOLN: 1.0000 | Freq: Every day | NASAL | 12 refills | Status: AC

## 2020-02-29 ENCOUNTER — Ambulatory Visit (HOSPITAL_COMMUNITY): Payer: Self-pay | Admitting: Pharmacist

## 2020-02-29 DIAGNOSIS — I110 Hypertensive heart disease with heart failure: Secondary | ICD-10-CM | POA: Diagnosis not present

## 2020-02-29 DIAGNOSIS — M4854XD Collapsed vertebra, not elsewhere classified, thoracic region, subsequent encounter for fracture with routine healing: Secondary | ICD-10-CM | POA: Diagnosis not present

## 2020-02-29 DIAGNOSIS — I5022 Chronic systolic (congestive) heart failure: Secondary | ICD-10-CM | POA: Diagnosis not present

## 2020-02-29 DIAGNOSIS — I480 Paroxysmal atrial fibrillation: Secondary | ICD-10-CM | POA: Diagnosis not present

## 2020-02-29 DIAGNOSIS — M4856XD Collapsed vertebra, not elsewhere classified, lumbar region, subsequent encounter for fracture with routine healing: Secondary | ICD-10-CM | POA: Diagnosis not present

## 2020-02-29 DIAGNOSIS — I428 Other cardiomyopathies: Secondary | ICD-10-CM | POA: Diagnosis not present

## 2020-02-29 LAB — POCT INR: INR: 1.5 — AB (ref 2.0–3.0)

## 2020-02-29 NOTE — Progress Notes (Signed)
LVAD INR 

## 2020-03-02 DIAGNOSIS — I428 Other cardiomyopathies: Secondary | ICD-10-CM | POA: Diagnosis not present

## 2020-03-02 DIAGNOSIS — I5022 Chronic systolic (congestive) heart failure: Secondary | ICD-10-CM | POA: Diagnosis not present

## 2020-03-02 DIAGNOSIS — M4856XD Collapsed vertebra, not elsewhere classified, lumbar region, subsequent encounter for fracture with routine healing: Secondary | ICD-10-CM | POA: Diagnosis not present

## 2020-03-02 DIAGNOSIS — I480 Paroxysmal atrial fibrillation: Secondary | ICD-10-CM | POA: Diagnosis not present

## 2020-03-02 DIAGNOSIS — I110 Hypertensive heart disease with heart failure: Secondary | ICD-10-CM | POA: Diagnosis not present

## 2020-03-02 DIAGNOSIS — M4854XD Collapsed vertebra, not elsewhere classified, thoracic region, subsequent encounter for fracture with routine healing: Secondary | ICD-10-CM | POA: Diagnosis not present

## 2020-03-07 ENCOUNTER — Ambulatory Visit (HOSPITAL_COMMUNITY): Payer: Self-pay | Admitting: Pharmacist

## 2020-03-07 DIAGNOSIS — M4856XD Collapsed vertebra, not elsewhere classified, lumbar region, subsequent encounter for fracture with routine healing: Secondary | ICD-10-CM | POA: Diagnosis not present

## 2020-03-07 DIAGNOSIS — I480 Paroxysmal atrial fibrillation: Secondary | ICD-10-CM | POA: Diagnosis not present

## 2020-03-07 DIAGNOSIS — I5022 Chronic systolic (congestive) heart failure: Secondary | ICD-10-CM | POA: Diagnosis not present

## 2020-03-07 DIAGNOSIS — I110 Hypertensive heart disease with heart failure: Secondary | ICD-10-CM | POA: Diagnosis not present

## 2020-03-07 DIAGNOSIS — I428 Other cardiomyopathies: Secondary | ICD-10-CM | POA: Diagnosis not present

## 2020-03-07 DIAGNOSIS — M4854XD Collapsed vertebra, not elsewhere classified, thoracic region, subsequent encounter for fracture with routine healing: Secondary | ICD-10-CM | POA: Diagnosis not present

## 2020-03-07 LAB — POCT INR: INR: 1.7 — AB (ref 2.0–3.0)

## 2020-03-07 NOTE — Progress Notes (Signed)
LVAD INR 

## 2020-03-08 DIAGNOSIS — M4854XD Collapsed vertebra, not elsewhere classified, thoracic region, subsequent encounter for fracture with routine healing: Secondary | ICD-10-CM | POA: Diagnosis not present

## 2020-03-08 DIAGNOSIS — I110 Hypertensive heart disease with heart failure: Secondary | ICD-10-CM | POA: Diagnosis not present

## 2020-03-08 DIAGNOSIS — Z79891 Long term (current) use of opiate analgesic: Secondary | ICD-10-CM | POA: Diagnosis not present

## 2020-03-08 DIAGNOSIS — E8809 Other disorders of plasma-protein metabolism, not elsewhere classified: Secondary | ICD-10-CM | POA: Diagnosis not present

## 2020-03-08 DIAGNOSIS — R630 Anorexia: Secondary | ICD-10-CM | POA: Diagnosis not present

## 2020-03-08 DIAGNOSIS — I700 Atherosclerosis of aorta: Secondary | ICD-10-CM | POA: Diagnosis not present

## 2020-03-08 DIAGNOSIS — Z87891 Personal history of nicotine dependence: Secondary | ICD-10-CM | POA: Diagnosis not present

## 2020-03-08 DIAGNOSIS — Z7901 Long term (current) use of anticoagulants: Secondary | ICD-10-CM | POA: Diagnosis not present

## 2020-03-08 DIAGNOSIS — M8580 Other specified disorders of bone density and structure, unspecified site: Secondary | ICD-10-CM | POA: Diagnosis not present

## 2020-03-08 DIAGNOSIS — I480 Paroxysmal atrial fibrillation: Secondary | ICD-10-CM | POA: Diagnosis not present

## 2020-03-08 DIAGNOSIS — Z9181 History of falling: Secondary | ICD-10-CM | POA: Diagnosis not present

## 2020-03-08 DIAGNOSIS — M48061 Spinal stenosis, lumbar region without neurogenic claudication: Secondary | ICD-10-CM | POA: Diagnosis not present

## 2020-03-08 DIAGNOSIS — I5022 Chronic systolic (congestive) heart failure: Secondary | ICD-10-CM | POA: Diagnosis not present

## 2020-03-08 DIAGNOSIS — M4856XD Collapsed vertebra, not elsewhere classified, lumbar region, subsequent encounter for fracture with routine healing: Secondary | ICD-10-CM | POA: Diagnosis not present

## 2020-03-08 DIAGNOSIS — Z95811 Presence of heart assist device: Secondary | ICD-10-CM | POA: Diagnosis not present

## 2020-03-08 DIAGNOSIS — I428 Other cardiomyopathies: Secondary | ICD-10-CM | POA: Diagnosis not present

## 2020-03-08 DIAGNOSIS — Z95810 Presence of automatic (implantable) cardiac defibrillator: Secondary | ICD-10-CM | POA: Diagnosis not present

## 2020-03-08 DIAGNOSIS — Z9861 Coronary angioplasty status: Secondary | ICD-10-CM | POA: Diagnosis not present

## 2020-03-08 DIAGNOSIS — D649 Anemia, unspecified: Secondary | ICD-10-CM | POA: Diagnosis not present

## 2020-03-13 DIAGNOSIS — M4854XD Collapsed vertebra, not elsewhere classified, thoracic region, subsequent encounter for fracture with routine healing: Secondary | ICD-10-CM | POA: Diagnosis not present

## 2020-03-13 DIAGNOSIS — I428 Other cardiomyopathies: Secondary | ICD-10-CM | POA: Diagnosis not present

## 2020-03-13 DIAGNOSIS — M4856XD Collapsed vertebra, not elsewhere classified, lumbar region, subsequent encounter for fracture with routine healing: Secondary | ICD-10-CM | POA: Diagnosis not present

## 2020-03-13 DIAGNOSIS — I110 Hypertensive heart disease with heart failure: Secondary | ICD-10-CM | POA: Diagnosis not present

## 2020-03-13 DIAGNOSIS — I5022 Chronic systolic (congestive) heart failure: Secondary | ICD-10-CM | POA: Diagnosis not present

## 2020-03-13 DIAGNOSIS — M48061 Spinal stenosis, lumbar region without neurogenic claudication: Secondary | ICD-10-CM | POA: Diagnosis not present

## 2020-03-14 ENCOUNTER — Ambulatory Visit (HOSPITAL_COMMUNITY): Payer: Self-pay | Admitting: Pharmacist

## 2020-03-14 LAB — POCT INR: INR: 1.7 — AB (ref 2.0–3.0)

## 2020-03-14 MED ORDER — WARFARIN SODIUM 2.5 MG PO TABS: ORAL_TABLET | ORAL | 3 refills | Status: AC

## 2020-03-14 NOTE — Progress Notes (Signed)
LVAD INR 

## 2020-03-16 DIAGNOSIS — M48061 Spinal stenosis, lumbar region without neurogenic claudication: Secondary | ICD-10-CM | POA: Diagnosis not present

## 2020-03-16 DIAGNOSIS — M4856XD Collapsed vertebra, not elsewhere classified, lumbar region, subsequent encounter for fracture with routine healing: Secondary | ICD-10-CM | POA: Diagnosis not present

## 2020-03-16 DIAGNOSIS — I5022 Chronic systolic (congestive) heart failure: Secondary | ICD-10-CM | POA: Diagnosis not present

## 2020-03-16 DIAGNOSIS — I428 Other cardiomyopathies: Secondary | ICD-10-CM | POA: Diagnosis not present

## 2020-03-16 DIAGNOSIS — M4854XD Collapsed vertebra, not elsewhere classified, thoracic region, subsequent encounter for fracture with routine healing: Secondary | ICD-10-CM | POA: Diagnosis not present

## 2020-03-16 DIAGNOSIS — I110 Hypertensive heart disease with heart failure: Secondary | ICD-10-CM | POA: Diagnosis not present

## 2020-03-20 DIAGNOSIS — M48061 Spinal stenosis, lumbar region without neurogenic claudication: Secondary | ICD-10-CM | POA: Diagnosis not present

## 2020-03-20 DIAGNOSIS — I110 Hypertensive heart disease with heart failure: Secondary | ICD-10-CM | POA: Diagnosis not present

## 2020-03-20 DIAGNOSIS — I428 Other cardiomyopathies: Secondary | ICD-10-CM | POA: Diagnosis not present

## 2020-03-20 DIAGNOSIS — M4854XD Collapsed vertebra, not elsewhere classified, thoracic region, subsequent encounter for fracture with routine healing: Secondary | ICD-10-CM | POA: Diagnosis not present

## 2020-03-20 DIAGNOSIS — I5022 Chronic systolic (congestive) heart failure: Secondary | ICD-10-CM | POA: Diagnosis not present

## 2020-03-20 DIAGNOSIS — M4856XD Collapsed vertebra, not elsewhere classified, lumbar region, subsequent encounter for fracture with routine healing: Secondary | ICD-10-CM | POA: Diagnosis not present

## 2020-03-21 ENCOUNTER — Ambulatory Visit (HOSPITAL_COMMUNITY): Payer: Self-pay | Admitting: Pharmacist

## 2020-03-21 DIAGNOSIS — Z7901 Long term (current) use of anticoagulants: Secondary | ICD-10-CM | POA: Diagnosis not present

## 2020-03-21 LAB — POCT INR: INR: 2.6 (ref 2.0–3.0)

## 2020-03-21 NOTE — Progress Notes (Signed)
LVAD INR 

## 2020-03-22 DIAGNOSIS — M4854XD Collapsed vertebra, not elsewhere classified, thoracic region, subsequent encounter for fracture with routine healing: Secondary | ICD-10-CM | POA: Diagnosis not present

## 2020-03-22 DIAGNOSIS — M4856XD Collapsed vertebra, not elsewhere classified, lumbar region, subsequent encounter for fracture with routine healing: Secondary | ICD-10-CM | POA: Diagnosis not present

## 2020-03-22 DIAGNOSIS — I5022 Chronic systolic (congestive) heart failure: Secondary | ICD-10-CM | POA: Diagnosis not present

## 2020-03-22 DIAGNOSIS — I110 Hypertensive heart disease with heart failure: Secondary | ICD-10-CM | POA: Diagnosis not present

## 2020-03-22 DIAGNOSIS — I428 Other cardiomyopathies: Secondary | ICD-10-CM | POA: Diagnosis not present

## 2020-03-22 DIAGNOSIS — M48061 Spinal stenosis, lumbar region without neurogenic claudication: Secondary | ICD-10-CM | POA: Diagnosis not present

## 2020-03-25 ENCOUNTER — Other Ambulatory Visit (HOSPITAL_COMMUNITY): Payer: Self-pay | Admitting: Internal Medicine

## 2020-03-25 DIAGNOSIS — I480 Paroxysmal atrial fibrillation: Secondary | ICD-10-CM

## 2020-03-25 DIAGNOSIS — Z95811 Presence of heart assist device: Secondary | ICD-10-CM

## 2020-03-28 ENCOUNTER — Ambulatory Visit (HOSPITAL_COMMUNITY): Payer: Self-pay | Admitting: Pharmacist

## 2020-03-28 LAB — POCT INR: INR: 2.5 (ref 2.0–3.0)

## 2020-03-28 NOTE — Progress Notes (Signed)
LVAD INR 

## 2020-03-29 DIAGNOSIS — I428 Other cardiomyopathies: Secondary | ICD-10-CM | POA: Diagnosis not present

## 2020-03-29 DIAGNOSIS — I5022 Chronic systolic (congestive) heart failure: Secondary | ICD-10-CM | POA: Diagnosis not present

## 2020-03-29 DIAGNOSIS — I110 Hypertensive heart disease with heart failure: Secondary | ICD-10-CM | POA: Diagnosis not present

## 2020-03-29 DIAGNOSIS — M4854XD Collapsed vertebra, not elsewhere classified, thoracic region, subsequent encounter for fracture with routine healing: Secondary | ICD-10-CM | POA: Diagnosis not present

## 2020-03-29 DIAGNOSIS — M4856XD Collapsed vertebra, not elsewhere classified, lumbar region, subsequent encounter for fracture with routine healing: Secondary | ICD-10-CM | POA: Diagnosis not present

## 2020-03-29 DIAGNOSIS — M48061 Spinal stenosis, lumbar region without neurogenic claudication: Secondary | ICD-10-CM | POA: Diagnosis not present

## 2020-03-31 DIAGNOSIS — I428 Other cardiomyopathies: Secondary | ICD-10-CM | POA: Diagnosis not present

## 2020-03-31 DIAGNOSIS — I5022 Chronic systolic (congestive) heart failure: Secondary | ICD-10-CM | POA: Diagnosis not present

## 2020-03-31 DIAGNOSIS — I110 Hypertensive heart disease with heart failure: Secondary | ICD-10-CM | POA: Diagnosis not present

## 2020-03-31 DIAGNOSIS — M4854XD Collapsed vertebra, not elsewhere classified, thoracic region, subsequent encounter for fracture with routine healing: Secondary | ICD-10-CM | POA: Diagnosis not present

## 2020-03-31 DIAGNOSIS — M4856XD Collapsed vertebra, not elsewhere classified, lumbar region, subsequent encounter for fracture with routine healing: Secondary | ICD-10-CM | POA: Diagnosis not present

## 2020-03-31 DIAGNOSIS — M48061 Spinal stenosis, lumbar region without neurogenic claudication: Secondary | ICD-10-CM | POA: Diagnosis not present

## 2020-04-04 ENCOUNTER — Ambulatory Visit (HOSPITAL_COMMUNITY): Payer: Self-pay | Admitting: Pharmacist

## 2020-04-04 DIAGNOSIS — M48061 Spinal stenosis, lumbar region without neurogenic claudication: Secondary | ICD-10-CM | POA: Diagnosis not present

## 2020-04-04 DIAGNOSIS — M4854XD Collapsed vertebra, not elsewhere classified, thoracic region, subsequent encounter for fracture with routine healing: Secondary | ICD-10-CM | POA: Diagnosis not present

## 2020-04-04 DIAGNOSIS — I5022 Chronic systolic (congestive) heart failure: Secondary | ICD-10-CM | POA: Diagnosis not present

## 2020-04-04 DIAGNOSIS — I110 Hypertensive heart disease with heart failure: Secondary | ICD-10-CM | POA: Diagnosis not present

## 2020-04-04 DIAGNOSIS — M4856XD Collapsed vertebra, not elsewhere classified, lumbar region, subsequent encounter for fracture with routine healing: Secondary | ICD-10-CM | POA: Diagnosis not present

## 2020-04-04 DIAGNOSIS — I428 Other cardiomyopathies: Secondary | ICD-10-CM | POA: Diagnosis not present

## 2020-04-04 LAB — POCT INR: INR: 2.6 (ref 2.0–3.0)

## 2020-04-04 NOTE — Progress Notes (Signed)
LVAD INR 

## 2020-04-06 DIAGNOSIS — I110 Hypertensive heart disease with heart failure: Secondary | ICD-10-CM | POA: Diagnosis not present

## 2020-04-06 DIAGNOSIS — M48061 Spinal stenosis, lumbar region without neurogenic claudication: Secondary | ICD-10-CM | POA: Diagnosis not present

## 2020-04-06 DIAGNOSIS — M4854XD Collapsed vertebra, not elsewhere classified, thoracic region, subsequent encounter for fracture with routine healing: Secondary | ICD-10-CM | POA: Diagnosis not present

## 2020-04-06 DIAGNOSIS — M4856XD Collapsed vertebra, not elsewhere classified, lumbar region, subsequent encounter for fracture with routine healing: Secondary | ICD-10-CM | POA: Diagnosis not present

## 2020-04-06 DIAGNOSIS — I428 Other cardiomyopathies: Secondary | ICD-10-CM | POA: Diagnosis not present

## 2020-04-06 DIAGNOSIS — I5022 Chronic systolic (congestive) heart failure: Secondary | ICD-10-CM | POA: Diagnosis not present

## 2020-04-07 DIAGNOSIS — M4854XD Collapsed vertebra, not elsewhere classified, thoracic region, subsequent encounter for fracture with routine healing: Secondary | ICD-10-CM | POA: Diagnosis not present

## 2020-04-07 DIAGNOSIS — Z9861 Coronary angioplasty status: Secondary | ICD-10-CM | POA: Diagnosis not present

## 2020-04-07 DIAGNOSIS — R630 Anorexia: Secondary | ICD-10-CM | POA: Diagnosis not present

## 2020-04-07 DIAGNOSIS — Z9181 History of falling: Secondary | ICD-10-CM | POA: Diagnosis not present

## 2020-04-07 DIAGNOSIS — M4856XD Collapsed vertebra, not elsewhere classified, lumbar region, subsequent encounter for fracture with routine healing: Secondary | ICD-10-CM | POA: Diagnosis not present

## 2020-04-07 DIAGNOSIS — I110 Hypertensive heart disease with heart failure: Secondary | ICD-10-CM | POA: Diagnosis not present

## 2020-04-07 DIAGNOSIS — Z79891 Long term (current) use of opiate analgesic: Secondary | ICD-10-CM | POA: Diagnosis not present

## 2020-04-07 DIAGNOSIS — I5022 Chronic systolic (congestive) heart failure: Secondary | ICD-10-CM | POA: Diagnosis not present

## 2020-04-07 DIAGNOSIS — D649 Anemia, unspecified: Secondary | ICD-10-CM | POA: Diagnosis not present

## 2020-04-07 DIAGNOSIS — M48061 Spinal stenosis, lumbar region without neurogenic claudication: Secondary | ICD-10-CM | POA: Diagnosis not present

## 2020-04-07 DIAGNOSIS — M8580 Other specified disorders of bone density and structure, unspecified site: Secondary | ICD-10-CM | POA: Diagnosis not present

## 2020-04-07 DIAGNOSIS — Z95811 Presence of heart assist device: Secondary | ICD-10-CM | POA: Diagnosis not present

## 2020-04-07 DIAGNOSIS — Z7901 Long term (current) use of anticoagulants: Secondary | ICD-10-CM | POA: Diagnosis not present

## 2020-04-07 DIAGNOSIS — I480 Paroxysmal atrial fibrillation: Secondary | ICD-10-CM | POA: Diagnosis not present

## 2020-04-07 DIAGNOSIS — E8809 Other disorders of plasma-protein metabolism, not elsewhere classified: Secondary | ICD-10-CM | POA: Diagnosis not present

## 2020-04-07 DIAGNOSIS — I428 Other cardiomyopathies: Secondary | ICD-10-CM | POA: Diagnosis not present

## 2020-04-07 DIAGNOSIS — Z95810 Presence of automatic (implantable) cardiac defibrillator: Secondary | ICD-10-CM | POA: Diagnosis not present

## 2020-04-07 DIAGNOSIS — Z87891 Personal history of nicotine dependence: Secondary | ICD-10-CM | POA: Diagnosis not present

## 2020-04-07 DIAGNOSIS — I700 Atherosclerosis of aorta: Secondary | ICD-10-CM | POA: Diagnosis not present

## 2020-04-11 ENCOUNTER — Ambulatory Visit (HOSPITAL_COMMUNITY): Payer: Self-pay | Admitting: Pharmacist

## 2020-04-11 DIAGNOSIS — M4854XD Collapsed vertebra, not elsewhere classified, thoracic region, subsequent encounter for fracture with routine healing: Secondary | ICD-10-CM | POA: Diagnosis not present

## 2020-04-11 DIAGNOSIS — I428 Other cardiomyopathies: Secondary | ICD-10-CM | POA: Diagnosis not present

## 2020-04-11 DIAGNOSIS — I5022 Chronic systolic (congestive) heart failure: Secondary | ICD-10-CM | POA: Diagnosis not present

## 2020-04-11 DIAGNOSIS — I110 Hypertensive heart disease with heart failure: Secondary | ICD-10-CM | POA: Diagnosis not present

## 2020-04-11 DIAGNOSIS — M4856XD Collapsed vertebra, not elsewhere classified, lumbar region, subsequent encounter for fracture with routine healing: Secondary | ICD-10-CM | POA: Diagnosis not present

## 2020-04-11 DIAGNOSIS — M48061 Spinal stenosis, lumbar region without neurogenic claudication: Secondary | ICD-10-CM | POA: Diagnosis not present

## 2020-04-11 LAB — POCT INR: INR: 2.4 (ref 2.0–3.0)

## 2020-04-11 NOTE — Progress Notes (Signed)
LVAD INR 

## 2020-04-12 ENCOUNTER — Other Ambulatory Visit (HOSPITAL_COMMUNITY): Payer: Self-pay | Admitting: *Deleted

## 2020-04-12 ENCOUNTER — Other Ambulatory Visit: Payer: Self-pay | Admitting: *Deleted

## 2020-04-12 DIAGNOSIS — Z95811 Presence of heart assist device: Secondary | ICD-10-CM

## 2020-04-13 ENCOUNTER — Encounter (HOSPITAL_COMMUNITY): Payer: Self-pay

## 2020-04-13 ENCOUNTER — Other Ambulatory Visit (HOSPITAL_COMMUNITY): Payer: Self-pay | Admitting: *Deleted

## 2020-04-13 ENCOUNTER — Ambulatory Visit (HOSPITAL_COMMUNITY)
Admission: RE | Admit: 2020-04-13 | Discharge: 2020-04-13 | Disposition: A | Discharge status: Home / Self Care | Payer: Medicare Other | Source: Ambulatory Visit | Attending: Internal Medicine | Admitting: Internal Medicine

## 2020-04-13 ENCOUNTER — Other Ambulatory Visit: Payer: Self-pay

## 2020-04-13 VITALS — BP 119/85 | HR 86 | Temp 98.4°F | Ht 60.0 in | Wt 168.8 lb

## 2020-04-13 DIAGNOSIS — I110 Hypertensive heart disease with heart failure: Secondary | ICD-10-CM | POA: Insufficient documentation

## 2020-04-13 DIAGNOSIS — M4856XD Collapsed vertebra, not elsewhere classified, lumbar region, subsequent encounter for fracture with routine healing: Secondary | ICD-10-CM | POA: Diagnosis not present

## 2020-04-13 DIAGNOSIS — Z95811 Presence of heart assist device: Secondary | ICD-10-CM

## 2020-04-13 DIAGNOSIS — I428 Other cardiomyopathies: Secondary | ICD-10-CM | POA: Insufficient documentation

## 2020-04-13 DIAGNOSIS — K922 Gastrointestinal hemorrhage, unspecified: Secondary | ICD-10-CM | POA: Insufficient documentation

## 2020-04-13 DIAGNOSIS — I480 Paroxysmal atrial fibrillation: Secondary | ICD-10-CM | POA: Diagnosis not present

## 2020-04-13 DIAGNOSIS — I10 Essential (primary) hypertension: Secondary | ICD-10-CM

## 2020-04-13 DIAGNOSIS — M4856XA Collapsed vertebra, not elsewhere classified, lumbar region, initial encounter for fracture: Secondary | ICD-10-CM | POA: Insufficient documentation

## 2020-04-13 DIAGNOSIS — Z7901 Long term (current) use of anticoagulants: Secondary | ICD-10-CM

## 2020-04-13 DIAGNOSIS — Z79899 Other long term (current) drug therapy: Secondary | ICD-10-CM | POA: Diagnosis not present

## 2020-04-13 DIAGNOSIS — S32000G Wedge compression fracture of unspecified lumbar vertebra, subsequent encounter for fracture with delayed healing: Secondary | ICD-10-CM

## 2020-04-13 DIAGNOSIS — Z885 Allergy status to narcotic agent status: Secondary | ICD-10-CM | POA: Insufficient documentation

## 2020-04-13 DIAGNOSIS — Z8674 Personal history of sudden cardiac arrest: Secondary | ICD-10-CM | POA: Insufficient documentation

## 2020-04-13 DIAGNOSIS — I5022 Chronic systolic (congestive) heart failure: Secondary | ICD-10-CM | POA: Diagnosis not present

## 2020-04-13 DIAGNOSIS — Z452 Encounter for adjustment and management of vascular access device: Secondary | ICD-10-CM | POA: Diagnosis not present

## 2020-04-13 DIAGNOSIS — Z8719 Personal history of other diseases of the digestive system: Secondary | ICD-10-CM | POA: Diagnosis not present

## 2020-04-13 DIAGNOSIS — M8580 Other specified disorders of bone density and structure, unspecified site: Secondary | ICD-10-CM | POA: Insufficient documentation

## 2020-04-13 DIAGNOSIS — Z95810 Presence of automatic (implantable) cardiac defibrillator: Secondary | ICD-10-CM | POA: Insufficient documentation

## 2020-04-13 LAB — COMPREHENSIVE METABOLIC PANEL
ALT: 21 U/L (ref 0–44)
AST: 25 U/L (ref 15–41)
Albumin: 3.3 g/dL — ABNORMAL LOW (ref 3.5–5.0)
Alkaline Phosphatase: 93 U/L (ref 38–126)
Anion gap: 11 (ref 5–15)
BUN: 18 mg/dL (ref 8–23)
CO2: 22 mmol/L (ref 22–32)
Calcium: 9.6 mg/dL (ref 8.9–10.3)
Chloride: 106 mmol/L (ref 98–111)
Creatinine, Ser: 1.09 mg/dL — ABNORMAL HIGH (ref 0.44–1.00)
GFR calc Af Amer: 54 mL/min — ABNORMAL LOW (ref >60)
GFR calc non Af Amer: 47 mL/min — ABNORMAL LOW (ref >60)
Glucose, Bld: 99 mg/dL (ref 70–99)
Potassium: 4.3 mmol/L (ref 3.5–5.1)
Sodium: 139 mmol/L (ref 135–145)
Total Bilirubin: 0.6 mg/dL (ref 0.3–1.2)
Total Protein: 9.7 g/dL — ABNORMAL HIGH (ref 6.5–8.1)

## 2020-04-13 LAB — PROTIME-INR
INR: 2.2 — ABNORMAL HIGH (ref 0.8–1.2)
Prothrombin Time: 23.8 seconds — ABNORMAL HIGH (ref 11.4–15.2)

## 2020-04-13 LAB — CBC
HCT: 35.5 % — ABNORMAL LOW (ref 36.0–46.0)
Hemoglobin: 11 g/dL — ABNORMAL LOW (ref 12.0–15.0)
MCH: 30.5 pg (ref 26.0–34.0)
MCHC: 31 g/dL (ref 30.0–36.0)
MCV: 98.3 fL (ref 80.0–100.0)
Platelets: 262 10*3/uL (ref 150–400)
RBC: 3.61 MIL/uL — ABNORMAL LOW (ref 3.87–5.11)
RDW: 16.2 % — ABNORMAL HIGH (ref 11.5–15.5)
WBC: 6.4 10*3/uL (ref 4.0–10.5)
nRBC: 0 % (ref 0.0–0.2)

## 2020-04-13 LAB — PREALBUMIN: Prealbumin: 16.4 mg/dL — ABNORMAL LOW (ref 18–38)

## 2020-04-13 LAB — LACTATE DEHYDROGENASE: LDH: 199 U/L — ABNORMAL HIGH (ref 98–192)

## 2020-04-13 LAB — TSH: TSH: 6.101 u[IU]/mL — ABNORMAL HIGH (ref 0.350–4.500)

## 2020-04-13 LAB — MAGNESIUM: Magnesium: 1.9 mg/dL (ref 1.7–2.4)

## 2020-04-13 MED ORDER — LOSARTAN POTASSIUM 50 MG PO TABS: 50.0000 mg | ORAL_TABLET | Freq: Every day | ORAL | 3 refills | Status: AC

## 2020-04-13 NOTE — Progress Notes (Signed)
LVAD Clinic Note  Patient ID: Brianna Hanson, female   DOB: May 01, 1935, 85 y.o.   MRN: [id number] Primary Heart Failure: Dr Haroldine Laws Cardiac Surgeon: Dr Prescott Gum  HPI: Brianna Hanson is a 85 y.o. 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 [name] (follwoed q 6 months). Underwent implantation of the HeartMate II LVAD on 04/06/13 for DT. She is not on aspirin due to dizziness. S/P lap cholecystitis 10/2015.   GI Issues 09/2016 LGIB. Angio- no source of bleeding. LVAD speed turned down to 8400  LVAD Speed Change 12/2016 Ramp ECHO -->Speed increased from 8400>8800  Significant Events on VAD Support:  09/21/16 GIB, Ramp echo speed decreased to 8400  01/08/17 - LOW FLOW alarms; speed increased to 8800 RPM 05/20/17>speed decreased to 8600 06/18/19 - DC-CV of AF   Had recurrent AF on device interrogation and amio increased to 200 bid. Did not tolerate well with increased low flows on VAD. Underwent successful DC-CV on 06/18/19.  Admitted 4/21 and underwent multi-level kyphoplasty.   She presents today for routine VAD f/u. Back and hip pain much improved after kyphoplasty and with use of CBD. Now much more active again. Denies orthopnea or PND. No fevers, chills or problems with driveline. No bleeding, melena or neuro symptoms. No VAD alarms. Taking all meds as prescribed.   VAD Indication: Destination Therapy - age excluding and patient choice    VAD interrogation & Equipment Management: Speed: 8600 Flow: 3.8 Power: 4.4w PI: 6.6  Alarms: none Events: 10 - 15 PI events daily with > 150 PI events on 04/07/20   Fixed speed 8600 Low speed limit: 8000  Primary Controller: Replace back up battery in 5 months - replaced with [id number]  Back up controller: Replace back up battery in12 months   Exit Site Care: Drive line is being maintained [employer]. Drive line exit site well healed and  incorporated. The velour is fully implanted at exit site. Dressing dry and intact. No erythema or drainage. Stabilization device present and accurately applied. Pt denies fever or chills. Pt provided with 8 weekly dressing kits.   Significant Events on VAD Support:  09/21/16 GIB, Ramp echo speed decreased to 8400  01/08/17 - LOW FLOW alarms; speed increased to 8800 RPM 05/20/17>speed decreased to 8600     Past Medical History:  Diagnosis Date   AICD (automatic cardioverter/defibrillator) present    Anemia    Atrial fibrillation or flutter    Cardiac arrest - ventricular fibrillation 12/10   with successful resucitation, S/p ICD   CHF (congestive heart failure) ([place])    Diverticula of colon 2011   HTN (hypertension)    moderate   ICD (implantable cardiac defibrillator) in place    she has received appropriate therapy for VF   Internal and external hemorrhoids without complication 6568   Nonischemic cardiomyopathy ([place])    followed by Dr April Holding at [facility]   Osteopenia    Plasma cell disorder 03/20/2012   Plasma cell disorder 03/20/2012   Sessile colonic polyp 2011   Dr Benson [place]    Current Outpatient Medications  Medication Sig Dispense Refill   amiodarone (PACERONE) 200 MG tablet TAKE 1 TABLET BY MOUTH EVERY DAY 90 tablet 3   calcitonin, salmon, (MIACALCIN/FORTICAL) 200 UNIT/ACT nasal spray Place 1 spray into alternate nostrils daily. 3.7 mL 12   carvedilol (COREG) 6.25 MG tablet TAKE 1 TABLET BY  MOUTH 2 TIMES DAILY WITH A MEAL 180 tablet 3   Cholecalciferol (VITAMIN D3) 25 MCG (1000 UT) CAPS Take 1,000 Units by mouth daily.      docusate sodium (COLACE) 100 MG capsule Take 100 mg by mouth daily.     ferrous sulfate 325 (65 FE) MG tablet Take 1 tablet (325 mg total) by mouth 2 (two) times daily with a meal. 60 tablet 6   gabapentin (NEURONTIN) 100 MG capsule Take 3 capsules (300 mg total) by mouth 2 (two) times daily. (Patient taking differently: Take 100 mg by  mouth 2 (two) times daily. ) 180 capsule 3   hydrALAZINE (APRESOLINE) 100 MG tablet Take 0.5 tablets (50 mg total) by mouth 3 (three) times daily. 90 tablet 9   magnesium oxide (MAG-OX) 400 (241.3 Mg) MG tablet Take 0.5 tablets (200 mg total) by mouth 2 (two) times daily. 60 tablet 6   ondansetron (ZOFRAN) 4 MG tablet Take 1 tablet (4 mg total) by mouth every 8 (eight) hours as needed for nausea or vomiting. 50 tablet 4   pantoprazole (PROTONIX) 40 MG tablet Take 40 mg by mouth daily.     potassium chloride SA (KLOR-CON M20) 20 MEQ tablet Take 1 tablet (20 mEq total) by mouth 2 (two) times daily. 180 tablet 3   traMADol (ULTRAM) 50 MG tablet Take 2 tablets (100 mg total) by mouth every 12 (twelve) hours as needed for moderate pain. 10 tablet 0   warfarin (COUMADIN) 2.5 MG tablet Take 5 mg (2 tabs) every Monday and 2.5 mg (1 tab) all other days or as directed by HF Clinic 180 tablet 3   losartan (COZAAR) 100 MG tablet Take 0.5 tablets (50 mg total) by mouth daily. 30 tablet 3   promethazine (PHENERGAN) 12.5 MG tablet Take 1 tablet (12.5 mg total) by mouth every 6 (six) hours as needed for nausea or vomiting. (Patient not taking: Reported on 04/13/2020) 30 tablet 4   No current facility-administered medications for this encounter.   Allergies  Allergen Reactions   Oxycodone Other (See Comments)    Hallucinations     Review of systems complete and found to be negative unless listed in HPI.     Vital Signs:  Temp: 98.4 Doppler Pressure:  120 Automatc BP: 119/85 (99) HR: 86 SPO2:  96% on RA  Weight: 168.8 lb w/o eqt  Last weight: 163.4 lbs   General:  NAD.  HEENT: normal  Neck: supple. JVP not elevated.  Carotids 2+ bilat; no bruits. No lymphadenopathy or thryomegaly appreciated. Cor: LVAD hum.  Lungs: Clear. Abdomen: obese soft, nontender, non-distended. No hepatosplenomegaly. No bruits or masses. Good bowel sounds. Driveline site clean. Anchor in place.  Extremities:  no cyanosis, clubbing, rash. Warm no edema  Neuro: alert & oriented x 3. No focal deficits. Moves all 4 without problem     ASSESSMENT AND PLAN:  1) Low back and left hip pain in setting of L1 compression fracture and new L3 fracture -  Much improved s/p multi-level kyphoplasty 4/21 and with use of CBD oil  2) Chronic systolic HF: NICM, s/p ICD and LVAD for DT(04/2013).  - now > 7 years out on VAD support - Improved NYHA II - Volume status ok. Continue to keep fluid intake up  3) LVAD placed for DT 04/2013:  - Failed Entresto due to diuresis and incessant low flow alarms.  - Continue losartan 100 mg daily.  - VAD interrogated personally. Parameters stable. - No ASA with GI  bleed and intolerance.  - Continue coumadin. No recent bleeding - LDH 199 - INR 2.2 Discussed dosing with PharmD personally. Try to keep 1.8-2.3 - Hgb 11.0  4) PAF:  - Did not tolerate AF well at all.  - S/p DC-CV 06/18/19 - Remains in NSR on 200 daily.  - Continue warfarin - no change  5) HTN:  -MAPs run a bit high but doesn't tolerate lower MAPs well  6) Recent BRBPR - suspect diverticulosis. Had episode in 09/2016.  - 12/2009 Colonoscopy, Dr Benson [place]: descending adenoma and pan-diverticulosis > in sigmoid, int/ext hemorrhoids.  - hgb 12.7 -> 11.7 -> 11.4 -> 11.0. MCV ok  - Bleeding has stopped - Has seen GI. Ee have decided not to pursue repeat colonoscopy at this time given age and comorbidities If bleeding persists can revisit  Total time spent 35 minutes. Over half that time spent discussing above.    Glori Bickers, MD 11:21 AM

## 2020-04-13 NOTE — Progress Notes (Signed)
Patient presents for 2 mo f/u visit visit today in [facility] today with caregiver, [name]. Reports no problems with VAD equipment or concerns with drive line.  7 year Intermacs and annual maintenance completed this visit. Universal Char[employer] fundraiser replaced - see below.   Pt reports she is feeling much better. Still having some lower back pain, but is walking more, increasing activity. She arrived using walker today, did not require w/c. She reports pain is much improved after starting CBC oil twice daily.   Increased # of PI events noted on 04/07/20 with patient reporting no sxs that she remembers. She and [name] were out of home and she did not drink as much as usual. Attempted to interrogate St Jude ICD, unable to communicate with the device.  Vital Signs:  Temp: 98.4 Doppler Pressure:  120 Automatc BP: 119/85 (99) HR: 86 SPO2:  96% on RA  Weight: 168.8 lb w/o eqt  Last weight: 163.4 lbs  VAD Indication: Destination Therapy - age excluding and patient choice    VAD interrogation & Equipment Management: Speed: 8600 Flow: 3.8 Power: 4.4w    PI: 6.6  Alarms: none Events: 10 - 15 PI events daily with > 150 PI events on 04/07/20   Fixed speed 8600 Low speed limit: 8000  Primary Controller: Replace back up battery in 5 months - replaced with [id number]  Back up controller: Replace back up battery in 12 months   Annual Equipment Maintenance on UBC/PM was performed today 04/13/20.  Exit Site Care: Drive line is being maintained weekly by [employer]. Drive line exit site well healed and incorporated. The velour is fully implanted at exit site. Dressing dry and intact. No erythema or drainage. Stabilization device present and accurately applied. Pt denies fever or chills. Pt provided with 8 weekly dressing kits and 5 anchors.  Significant Events on VAD Support:  09/21/16 GIB, Ramp echo speed decreased to 8400  01/08/17 - LOW FLOW alarms; speed increased to 8800 RPM 05/20/17> speed decreased  to 8600  Device:St Jude ICD Therapies: on at 200 bpm; monitor on 110 DDD 60 Last check: today: 08/03/19  BP & Labs: Doppler BP 120- is reflecting modified systolic  Hgb 73.2 - Reports bright red bleeding with stools once this week; has since resolved. Denies nosebleeds  LDH 199 - and is within established range is 150 - 300. Denies any dark urine.   Batteries Manufacture Date: Number of uses: Re-calibration  01/05/19 10 - 15 Performed by patient   Annual maintenance completed per [employer] on patient's home power module. Universal Charity fundraiser with corrosion on copper connections in each pocket with recommendation to replace unit. New UBC  and universal Charity fundraiser.    Backup system controller 11 volt battery charged every 3 mos by [name] at home.    7 year Intermacs follow up completed including:  Quality of Life and KCCQ-12. Pt did not attempt Neurocognitive trail making.   [facility] Cardiomyopathy Questionnaire  KCCQ-12 04/13/2020  1 a. Ability to shower/bathe Moderately limited  1 b. Ability to walk 1 block Moderately limited  1 c. Ability to hurry/jog Other, Did not do  2. Edema feet/ankles/legs Less than once a week  3. Limited by fatigue Less than once a week  4. Limited by dyspnea Less than once a week  5. Sitting up / on 3+ pillows Never over the past 2 weeks  6. Limited enjoyment of life Slightly limited  7. Rest of life w/ symptoms Completely satisfied  8  a. Participation in hobbies Limited quite a bit  8 b. Participation in chores Severely limited  8 c. Visiting family/friends Severely limited    Pt did not attempt 6 minute walk.  Patient Instructions: 1. No change in meds. 2. Return to [facility] in 2 months.  Zada Girt RN [place] Coordinator  Office: [phone]  24/7 Pager: [phone]

## 2020-04-13 NOTE — Patient Instructions (Signed)
1. No change in meds. 2. Return to [facility] in two months.

## 2020-04-14 LAB — T4: T4, Total: 7.4 ug/dL (ref 4.5–12.0)

## 2020-04-17 DIAGNOSIS — I5022 Chronic systolic (congestive) heart failure: Secondary | ICD-10-CM | POA: Diagnosis not present

## 2020-04-17 DIAGNOSIS — M4854XD Collapsed vertebra, not elsewhere classified, thoracic region, subsequent encounter for fracture with routine healing: Secondary | ICD-10-CM | POA: Diagnosis not present

## 2020-04-17 DIAGNOSIS — M48061 Spinal stenosis, lumbar region without neurogenic claudication: Secondary | ICD-10-CM | POA: Diagnosis not present

## 2020-04-17 DIAGNOSIS — I428 Other cardiomyopathies: Secondary | ICD-10-CM | POA: Diagnosis not present

## 2020-04-17 DIAGNOSIS — M4856XD Collapsed vertebra, not elsewhere classified, lumbar region, subsequent encounter for fracture with routine healing: Secondary | ICD-10-CM | POA: Diagnosis not present

## 2020-04-17 DIAGNOSIS — I110 Hypertensive heart disease with heart failure: Secondary | ICD-10-CM | POA: Diagnosis not present

## 2020-04-18 ENCOUNTER — Ambulatory Visit (HOSPITAL_COMMUNITY): Payer: Self-pay | Admitting: Pharmacist

## 2020-04-18 DIAGNOSIS — Z7901 Long term (current) use of anticoagulants: Secondary | ICD-10-CM | POA: Diagnosis not present

## 2020-04-18 LAB — POCT INR: INR: 1.9 — AB (ref 2.0–3.0)

## 2020-04-18 NOTE — Progress Notes (Signed)
LVAD INR 

## 2020-04-24 DIAGNOSIS — I5022 Chronic systolic (congestive) heart failure: Secondary | ICD-10-CM | POA: Diagnosis not present

## 2020-04-24 DIAGNOSIS — M4856XD Collapsed vertebra, not elsewhere classified, lumbar region, subsequent encounter for fracture with routine healing: Secondary | ICD-10-CM | POA: Diagnosis not present

## 2020-04-24 DIAGNOSIS — I110 Hypertensive heart disease with heart failure: Secondary | ICD-10-CM | POA: Diagnosis not present

## 2020-04-24 DIAGNOSIS — M48061 Spinal stenosis, lumbar region without neurogenic claudication: Secondary | ICD-10-CM | POA: Diagnosis not present

## 2020-04-24 DIAGNOSIS — I428 Other cardiomyopathies: Secondary | ICD-10-CM | POA: Diagnosis not present

## 2020-04-24 DIAGNOSIS — M4854XD Collapsed vertebra, not elsewhere classified, thoracic region, subsequent encounter for fracture with routine healing: Secondary | ICD-10-CM | POA: Diagnosis not present

## 2020-04-25 ENCOUNTER — Ambulatory Visit (HOSPITAL_COMMUNITY): Payer: Self-pay | Admitting: Pharmacist

## 2020-04-25 LAB — POCT INR: INR: 2.3 (ref 2.0–3.0)

## 2020-04-25 NOTE — Progress Notes (Signed)
LVAD INR 

## 2020-05-01 ENCOUNTER — Telehealth: Payer: Self-pay | Admitting: Internal Medicine

## 2020-05-01 NOTE — Telephone Encounter (Signed)
A message was sent a while back asking for benefits for Evenity or Prolia.  I have verified coverage for both.  Her plan will cover either at 100% (no cost to the patient).  If Dr Ronnald Ramp would like to continue with this recommendation she is okay to schedule at anytime.

## 2020-05-02 ENCOUNTER — Ambulatory Visit (HOSPITAL_COMMUNITY): Payer: Self-pay | Admitting: Pharmacist

## 2020-05-02 LAB — POCT INR: INR: 2.2 (ref 2.0–3.0)

## 2020-05-02 NOTE — Progress Notes (Signed)
LVAD INR 

## 2020-05-02 NOTE — Telephone Encounter (Signed)
OV 9/2 @1 :40pm

## 2020-05-02 NOTE — Telephone Encounter (Signed)
Pt has scheduled an OV to discuss with PCP further and do injection.

## 2020-05-04 ENCOUNTER — Other Ambulatory Visit: Payer: Self-pay

## 2020-05-04 ENCOUNTER — Ambulatory Visit (INDEPENDENT_AMBULATORY_CARE_PROVIDER_SITE_OTHER): Payer: Medicare Other | Admitting: Internal Medicine

## 2020-05-04 ENCOUNTER — Telehealth: Payer: Self-pay | Admitting: Internal Medicine

## 2020-05-04 ENCOUNTER — Encounter: Payer: Self-pay | Admitting: Internal Medicine

## 2020-05-04 VITALS — BP 114/78 | HR 79 | Temp 98.2°F | Resp 16 | Ht 60.0 in | Wt 169.0 lb

## 2020-05-04 DIAGNOSIS — M48061 Spinal stenosis, lumbar region without neurogenic claudication: Secondary | ICD-10-CM | POA: Diagnosis not present

## 2020-05-04 DIAGNOSIS — Z23 Encounter for immunization: Secondary | ICD-10-CM | POA: Diagnosis not present

## 2020-05-04 DIAGNOSIS — M8000XG Age-related osteoporosis with current pathological fracture, unspecified site, subsequent encounter for fracture with delayed healing: Secondary | ICD-10-CM | POA: Diagnosis not present

## 2020-05-04 DIAGNOSIS — M4854XD Collapsed vertebra, not elsewhere classified, thoracic region, subsequent encounter for fracture with routine healing: Secondary | ICD-10-CM | POA: Diagnosis not present

## 2020-05-04 DIAGNOSIS — I428 Other cardiomyopathies: Secondary | ICD-10-CM | POA: Diagnosis not present

## 2020-05-04 DIAGNOSIS — I110 Hypertensive heart disease with heart failure: Secondary | ICD-10-CM | POA: Diagnosis not present

## 2020-05-04 DIAGNOSIS — M4856XD Collapsed vertebra, not elsewhere classified, lumbar region, subsequent encounter for fracture with routine healing: Secondary | ICD-10-CM | POA: Diagnosis not present

## 2020-05-04 DIAGNOSIS — I5022 Chronic systolic (congestive) heart failure: Secondary | ICD-10-CM | POA: Diagnosis not present

## 2020-05-04 MED ORDER — ROMOSOZUMAB-AQQG 105 MG/1.17ML ~~LOC~~ SOSY
210.0000 mg | PREFILLED_SYRINGE | SUBCUTANEOUS | Status: AC
Start: 2020-06-05 — End: ?
  Administered 2020-08-07: 210 mg via SUBCUTANEOUS

## 2020-05-04 MED ORDER — ROMOSOZUMAB-AQQG 105 MG/1.17ML ~~LOC~~ SOSY
210.0000 mg | PREFILLED_SYRINGE | Freq: Once | SUBCUTANEOUS | Status: AC
  Administered 2020-05-04: 210 mg via SUBCUTANEOUS

## 2020-05-04 NOTE — Telephone Encounter (Signed)
Vaccines have been added to the immunization list.

## 2020-05-04 NOTE — Progress Notes (Signed)
Subjective:  Patient ID: Brianna Hanson, female    DOB: 06/07/1935  Age: 85 y.o. MRN: [id number]  CC: Osteoporosis  This visit occurred during the SARS-CoV-2 public health emergency.  Safety protocols were in place, including screening questions prior to the visit, additional usage of staff PPE, and extensive cleaning of exam room while observing appropriate contact time as indicated for disinfecting solutions.    HPI Brianna Hanson presents for f/up - she wants to start receiving EVENITY injections to prevent another fracture.  Outpatient Medications Prior to Visit  Medication Sig Dispense Refill  . amiodarone (PACERONE) 200 MG tablet TAKE 1 TABLET BY MOUTH EVERY DAY 90 tablet 3  . calcitonin, salmon, (MIACALCIN/FORTICAL) 200 UNIT/ACT nasal spray Place 1 spray into alternate nostrils daily. 3.7 mL 12  . carvedilol (COREG) 6.25 MG tablet TAKE 1 TABLET BY MOUTH 2 TIMES DAILY WITH A MEAL 180 tablet 3  . Cholecalciferol (VITAMIN D3) 25 MCG (1000 UT) CAPS Take 1,000 Units by mouth daily.     [name] docusate sodium (COLACE) 100 MG capsule Take 100 mg by mouth daily.    . ferrous sulfate 325 (65 FE) MG tablet Take 1 tablet (325 mg total) by mouth 2 (two) times daily with a meal. 60 tablet 6  . gabapentin (NEURONTIN) 100 MG capsule Take 3 capsules (300 mg total) by mouth 2 (two) times daily. (Patient taking differently: Take 100 mg by mouth 2 (two) times daily. ) 180 capsule 3  . hydrALAZINE (APRESOLINE) 100 MG tablet Take 0.5 tablets (50 mg total) by mouth 3 (three) times daily. 90 tablet 9  . losartan (COZAAR) 50 MG tablet Take 1 tablet (50 mg total) by mouth daily. 90 tablet 3  . magnesium oxide (MAG-OX) 400 (241.3 Mg) MG tablet Take 0.5 tablets (200 mg total) by mouth 2 (two) times daily. 60 tablet 6  . ondansetron (ZOFRAN) 4 MG tablet Take 1 tablet (4 mg total) by mouth every 8 (eight) hours as needed for nausea or vomiting. 50 tablet 4  . pantoprazole (PROTONIX) 40 MG tablet Take 40 mg by mouth  daily.    . potassium chloride SA (KLOR-CON M20) 20 MEQ tablet Take 1 tablet (20 mEq total) by mouth 2 (two) times daily. 180 tablet 3  . promethazine (PHENERGAN) 12.5 MG tablet Take 1 tablet (12.5 mg total) by mouth every 6 (six) hours as needed for nausea or vomiting. 30 tablet 4  . traMADol (ULTRAM) 50 MG tablet Take 2 tablets (100 mg total) by mouth every 12 (twelve) hours as needed for moderate pain. 10 tablet 0  . warfarin (COUMADIN) 2.5 MG tablet Take 5 mg (2 tabs) every Monday and 2.5 mg (1 tab) all other days or as directed by HF Clinic 180 tablet 3   No facility-administered medications prior to visit.    ROS Review of Systems  Constitutional: Positive for fatigue. Negative for diaphoresis and unexpected weight change.  HENT: Negative.   Eyes: Negative.   Respiratory: Positive for shortness of breath. Negative for cough, chest tightness and wheezing.   Cardiovascular: Negative for chest pain, palpitations and leg swelling.  Gastrointestinal: Negative for abdominal pain, blood in stool, constipation, diarrhea and vomiting.  Genitourinary: Negative.   Musculoskeletal: Positive for back pain. Negative for arthralgias and myalgias.  Skin: Negative.   Neurological: Negative.  Negative for dizziness, weakness and headaches.  Hematological: Negative for adenopathy. Does not bruise/bleed easily.  Psychiatric/Behavioral: Negative.     Objective:  BP 114/78   Pulse  79   Temp 98.2 F (36.8 C) (Oral)   Resp 16   Ht 5' (1.524 m)   Wt 169 lb (76.7 kg)   SpO2 95%   BMI 33.01 kg/m   BP Readings from Last 3 Encounters:  05/04/20 114/78  04/13/20 119/85  02/17/20 132/81    Wt Readings from Last 3 Encounters:  05/04/20 169 lb (76.7 kg)  04/13/20 168 lb 12.8 oz (76.6 kg)  02/17/20 163 lb 6.4 oz (74.1 kg)    Physical Exam Vitals reviewed.  HENT:     Nose: Nose normal.     Mouth/Throat:     Mouth: Mucous membranes are moist.  Eyes:     General: No scleral icterus.     Conjunctiva/sclera: Conjunctivae normal.  Cardiovascular:     Rate and Rhythm: Normal rate and regular rhythm.     Heart sounds: No murmur heard.      Comments: ++humming Pulmonary:     Effort: Pulmonary effort is normal.     Breath sounds: No stridor. No wheezing, rhonchi or rales.  Abdominal:     General: Abdomen is protuberant. Bowel sounds are normal. There is no distension.     Palpations: There is no hepatomegaly, splenomegaly or mass.     Tenderness: There is no abdominal tenderness.  Musculoskeletal:        General: Normal range of motion.     Cervical back: Neck supple.     Right lower leg: No edema.     Left lower leg: No edema.  Skin:    General: Skin is warm and dry.  Neurological:     General: No focal deficit present.     Mental Status: She is alert.     Lab Results  Component Value Date   WBC 6.4 04/13/2020   HGB 11.0 (L) 04/13/2020   HCT 35.5 (L) 04/13/2020   PLT 262 04/13/2020   GLUCOSE 99 04/13/2020   CHOL 160 05/19/2018   TRIG 189 (H) 05/19/2018   HDL 44 05/19/2018   LDLCALC 78 05/19/2018   ALT 21 04/13/2020   AST 25 04/13/2020   NA 139 04/13/2020   K 4.3 04/13/2020   CL 106 04/13/2020   CREATININE 1.09 (H) 04/13/2020   BUN 18 04/13/2020   CO2 22 04/13/2020   TSH 6.101 (H) 04/13/2020   INR 2.2 05/02/2020   HGBA1C 5.2 03/04/2018    No results found.  Assessment & Plan:   Ellianah was seen today for osteoporosis.  Diagnoses and all orders for this visit:  Osteoporosis with pathological fracture with delayed healing, subsequent encounter- She agrees to receive EVENITY injections monthly for the next 12 months. -     Romosozumab-aqqg (EVENITY) 105 MG/1.17ML injection 210 mg -     Romosozumab-aqqg (EVENITY) 105 MG/1.17ML injection 210 mg  Other orders -     Flu Vaccine QUAD 6+ mos PF IM (Fluarix Quad PF)   I am having Brianna Hanson maintain her ferrous sulfate, docusate sodium, Vitamin D3, carvedilol, potassium chloride SA, gabapentin,  ondansetron, promethazine, pantoprazole, traMADol, hydrALAZINE, magnesium oxide, calcitonin (salmon), warfarin, amiodarone, and losartan. We administered Romosozumab-aqqg. We will continue to administer Romosozumab-aqqg.  Meds ordered this encounter  Medications  . Romosozumab-aqqg (EVENITY) 105 MG/1.17ML injection 210 mg    Restricted to Outpatient Use.  Do Not Tube.  . Romosozumab-aqqg (EVENITY) 105 MG/1.17ML injection 210 mg    Restricted to Outpatient Use.  Do Not Tube.     Follow-up: No follow-ups on file.  Scarlette Calico, MD

## 2020-05-04 NOTE — Telephone Encounter (Signed)
    Patient calling to report she obtained [place] vaccines on 11/20/19 and 12/11/19

## 2020-05-07 DIAGNOSIS — I428 Other cardiomyopathies: Secondary | ICD-10-CM | POA: Diagnosis not present

## 2020-05-07 DIAGNOSIS — R630 Anorexia: Secondary | ICD-10-CM | POA: Diagnosis not present

## 2020-05-07 DIAGNOSIS — Z87891 Personal history of nicotine dependence: Secondary | ICD-10-CM | POA: Diagnosis not present

## 2020-05-07 DIAGNOSIS — Z95810 Presence of automatic (implantable) cardiac defibrillator: Secondary | ICD-10-CM | POA: Diagnosis not present

## 2020-05-07 DIAGNOSIS — I480 Paroxysmal atrial fibrillation: Secondary | ICD-10-CM | POA: Diagnosis not present

## 2020-05-07 DIAGNOSIS — Z9861 Coronary angioplasty status: Secondary | ICD-10-CM | POA: Diagnosis not present

## 2020-05-07 DIAGNOSIS — M8580 Other specified disorders of bone density and structure, unspecified site: Secondary | ICD-10-CM | POA: Diagnosis not present

## 2020-05-07 DIAGNOSIS — Z9181 History of falling: Secondary | ICD-10-CM | POA: Diagnosis not present

## 2020-05-07 DIAGNOSIS — Z7901 Long term (current) use of anticoagulants: Secondary | ICD-10-CM | POA: Diagnosis not present

## 2020-05-07 DIAGNOSIS — M4856XD Collapsed vertebra, not elsewhere classified, lumbar region, subsequent encounter for fracture with routine healing: Secondary | ICD-10-CM | POA: Diagnosis not present

## 2020-05-07 DIAGNOSIS — M48061 Spinal stenosis, lumbar region without neurogenic claudication: Secondary | ICD-10-CM | POA: Diagnosis not present

## 2020-05-07 DIAGNOSIS — I5022 Chronic systolic (congestive) heart failure: Secondary | ICD-10-CM | POA: Diagnosis not present

## 2020-05-07 DIAGNOSIS — I700 Atherosclerosis of aorta: Secondary | ICD-10-CM | POA: Diagnosis not present

## 2020-05-07 DIAGNOSIS — I110 Hypertensive heart disease with heart failure: Secondary | ICD-10-CM | POA: Diagnosis not present

## 2020-05-07 DIAGNOSIS — M4854XD Collapsed vertebra, not elsewhere classified, thoracic region, subsequent encounter for fracture with routine healing: Secondary | ICD-10-CM | POA: Diagnosis not present

## 2020-05-07 DIAGNOSIS — Z95811 Presence of heart assist device: Secondary | ICD-10-CM | POA: Diagnosis not present

## 2020-05-07 DIAGNOSIS — Z79891 Long term (current) use of opiate analgesic: Secondary | ICD-10-CM | POA: Diagnosis not present

## 2020-05-07 DIAGNOSIS — D649 Anemia, unspecified: Secondary | ICD-10-CM | POA: Diagnosis not present

## 2020-05-07 DIAGNOSIS — E8809 Other disorders of plasma-protein metabolism, not elsewhere classified: Secondary | ICD-10-CM | POA: Diagnosis not present

## 2020-05-09 ENCOUNTER — Ambulatory Visit (HOSPITAL_COMMUNITY): Payer: Self-pay | Admitting: Pharmacist

## 2020-05-09 LAB — POCT INR: INR: 1.9 — AB (ref 2.0–3.0)

## 2020-05-09 NOTE — Progress Notes (Signed)
LVAD INR 

## 2020-05-12 DIAGNOSIS — I428 Other cardiomyopathies: Secondary | ICD-10-CM | POA: Diagnosis not present

## 2020-05-12 DIAGNOSIS — Z95811 Presence of heart assist device: Secondary | ICD-10-CM | POA: Diagnosis not present

## 2020-05-12 DIAGNOSIS — I700 Atherosclerosis of aorta: Secondary | ICD-10-CM | POA: Diagnosis not present

## 2020-05-12 DIAGNOSIS — I110 Hypertensive heart disease with heart failure: Secondary | ICD-10-CM | POA: Diagnosis not present

## 2020-05-12 DIAGNOSIS — I480 Paroxysmal atrial fibrillation: Secondary | ICD-10-CM | POA: Diagnosis not present

## 2020-05-12 DIAGNOSIS — I5022 Chronic systolic (congestive) heart failure: Secondary | ICD-10-CM | POA: Diagnosis not present

## 2020-05-15 ENCOUNTER — Telehealth (HOSPITAL_COMMUNITY): Payer: Self-pay | Admitting: Cardiology

## 2020-05-15 NOTE — Telephone Encounter (Signed)
Verbal order given to HHPT to continue therapy for 1 per week for 4 weeks.  Joe,HHPT [phone]

## 2020-05-16 ENCOUNTER — Ambulatory Visit (HOSPITAL_COMMUNITY): Payer: Self-pay | Admitting: Pharmacist

## 2020-05-16 DIAGNOSIS — Z7901 Long term (current) use of anticoagulants: Secondary | ICD-10-CM | POA: Diagnosis not present

## 2020-05-16 LAB — POCT INR: INR: 2.5 (ref 2.0–3.0)

## 2020-05-18 DIAGNOSIS — I5022 Chronic systolic (congestive) heart failure: Secondary | ICD-10-CM | POA: Diagnosis not present

## 2020-05-18 DIAGNOSIS — I480 Paroxysmal atrial fibrillation: Secondary | ICD-10-CM | POA: Diagnosis not present

## 2020-05-18 DIAGNOSIS — I428 Other cardiomyopathies: Secondary | ICD-10-CM | POA: Diagnosis not present

## 2020-05-18 DIAGNOSIS — I700 Atherosclerosis of aorta: Secondary | ICD-10-CM | POA: Diagnosis not present

## 2020-05-18 DIAGNOSIS — I110 Hypertensive heart disease with heart failure: Secondary | ICD-10-CM | POA: Diagnosis not present

## 2020-05-18 DIAGNOSIS — Z95811 Presence of heart assist device: Secondary | ICD-10-CM | POA: Diagnosis not present

## 2020-05-23 ENCOUNTER — Ambulatory Visit (HOSPITAL_COMMUNITY): Payer: Self-pay | Admitting: Pharmacist

## 2020-05-23 LAB — POCT INR: INR: 2.5 (ref 2.0–3.0)

## 2020-05-23 NOTE — Progress Notes (Signed)
LVAD INR 

## 2020-05-29 DIAGNOSIS — I428 Other cardiomyopathies: Secondary | ICD-10-CM | POA: Diagnosis not present

## 2020-05-29 DIAGNOSIS — I5022 Chronic systolic (congestive) heart failure: Secondary | ICD-10-CM | POA: Diagnosis not present

## 2020-05-29 DIAGNOSIS — I480 Paroxysmal atrial fibrillation: Secondary | ICD-10-CM | POA: Diagnosis not present

## 2020-05-29 DIAGNOSIS — I700 Atherosclerosis of aorta: Secondary | ICD-10-CM | POA: Diagnosis not present

## 2020-05-29 DIAGNOSIS — Z95811 Presence of heart assist device: Secondary | ICD-10-CM | POA: Diagnosis not present

## 2020-05-29 DIAGNOSIS — I110 Hypertensive heart disease with heart failure: Secondary | ICD-10-CM | POA: Diagnosis not present

## 2020-05-30 ENCOUNTER — Ambulatory Visit (HOSPITAL_COMMUNITY): Payer: Self-pay | Admitting: Pharmacist

## 2020-05-30 LAB — POCT INR: INR: 2.7 (ref 2.0–3.0)

## 2020-05-30 NOTE — Progress Notes (Signed)
LVAD INR 

## 2020-06-05 ENCOUNTER — Ambulatory Visit (INDEPENDENT_AMBULATORY_CARE_PROVIDER_SITE_OTHER): Payer: Medicare Other

## 2020-06-05 DIAGNOSIS — M160 Bilateral primary osteoarthritis of hip: Secondary | ICD-10-CM

## 2020-06-05 MED ORDER — ROMOSOZUMAB-AQQG 105 MG/1.17ML ~~LOC~~ SOSY
210.0000 mg | PREFILLED_SYRINGE | Freq: Once | SUBCUTANEOUS | Status: AC
  Administered 2020-06-05: 210 mg via SUBCUTANEOUS

## 2020-06-05 NOTE — Progress Notes (Signed)
Per orders of Dr. Ronnald Ramp, injection of evenity given to patient in bilateral arm/subcutaneous by Judy Pimple. Patient tolerated well. I had to give Evenity to patient down in sports Med due to scheduling issues with primary care.

## 2020-06-06 ENCOUNTER — Ambulatory Visit (HOSPITAL_COMMUNITY): Payer: Self-pay | Admitting: Pharmacist

## 2020-06-06 ENCOUNTER — Other Ambulatory Visit (HOSPITAL_COMMUNITY): Payer: Self-pay | Admitting: *Deleted

## 2020-06-06 DIAGNOSIS — Z95811 Presence of heart assist device: Secondary | ICD-10-CM

## 2020-06-06 LAB — POCT INR: INR: 2 (ref 2.0–3.0)

## 2020-06-06 NOTE — Progress Notes (Signed)
LVAD INR 

## 2020-06-08 ENCOUNTER — Ambulatory Visit (HOSPITAL_COMMUNITY)
Admission: RE | Admit: 2020-06-08 | Discharge: 2020-06-08 | Disposition: A | Discharge status: Home / Self Care | Payer: Medicare Other | Source: Ambulatory Visit | Attending: Internal Medicine | Admitting: Internal Medicine

## 2020-06-08 ENCOUNTER — Other Ambulatory Visit: Payer: Self-pay

## 2020-06-08 VITALS — BP 119/91 | HR 83 | Ht 60.0 in | Wt 168.0 lb

## 2020-06-08 DIAGNOSIS — M4856XA Collapsed vertebra, not elsewhere classified, lumbar region, initial encounter for fracture: Secondary | ICD-10-CM | POA: Insufficient documentation

## 2020-06-08 DIAGNOSIS — Z8719 Personal history of other diseases of the digestive system: Secondary | ICD-10-CM | POA: Diagnosis not present

## 2020-06-08 DIAGNOSIS — S32000G Wedge compression fracture of unspecified lumbar vertebra, subsequent encounter for fracture with delayed healing: Secondary | ICD-10-CM

## 2020-06-08 DIAGNOSIS — Z79899 Other long term (current) drug therapy: Secondary | ICD-10-CM | POA: Insufficient documentation

## 2020-06-08 DIAGNOSIS — I480 Paroxysmal atrial fibrillation: Secondary | ICD-10-CM | POA: Diagnosis not present

## 2020-06-08 DIAGNOSIS — Z95810 Presence of automatic (implantable) cardiac defibrillator: Secondary | ICD-10-CM | POA: Insufficient documentation

## 2020-06-08 DIAGNOSIS — I110 Hypertensive heart disease with heart failure: Secondary | ICD-10-CM | POA: Diagnosis not present

## 2020-06-08 DIAGNOSIS — Z8674 Personal history of sudden cardiac arrest: Secondary | ICD-10-CM | POA: Diagnosis not present

## 2020-06-08 DIAGNOSIS — M25552 Pain in left hip: Secondary | ICD-10-CM | POA: Diagnosis not present

## 2020-06-08 DIAGNOSIS — Z7901 Long term (current) use of anticoagulants: Secondary | ICD-10-CM | POA: Insufficient documentation

## 2020-06-08 DIAGNOSIS — I5022 Chronic systolic (congestive) heart failure: Secondary | ICD-10-CM | POA: Insufficient documentation

## 2020-06-08 DIAGNOSIS — Z95811 Presence of heart assist device: Secondary | ICD-10-CM | POA: Diagnosis not present

## 2020-06-08 DIAGNOSIS — I10 Essential (primary) hypertension: Secondary | ICD-10-CM

## 2020-06-08 DIAGNOSIS — Z452 Encounter for adjustment and management of vascular access device: Secondary | ICD-10-CM | POA: Diagnosis not present

## 2020-06-08 DIAGNOSIS — Z885 Allergy status to narcotic agent status: Secondary | ICD-10-CM | POA: Diagnosis not present

## 2020-06-08 DIAGNOSIS — M8580 Other specified disorders of bone density and structure, unspecified site: Secondary | ICD-10-CM | POA: Diagnosis not present

## 2020-06-08 DIAGNOSIS — I428 Other cardiomyopathies: Secondary | ICD-10-CM | POA: Insufficient documentation

## 2020-06-08 DIAGNOSIS — E8809 Other disorders of plasma-protein metabolism, not elsewhere classified: Secondary | ICD-10-CM | POA: Diagnosis not present

## 2020-06-08 LAB — BASIC METABOLIC PANEL
Anion gap: 9 (ref 5–15)
BUN: 10 mg/dL (ref 8–23)
CO2: 23 mmol/L (ref 22–32)
Calcium: 9.6 mg/dL (ref 8.9–10.3)
Chloride: 106 mmol/L (ref 98–111)
Creatinine, Ser: 0.96 mg/dL (ref 0.44–1.00)
GFR calc non Af Amer: 55 mL/min — ABNORMAL LOW (ref >60)
Glucose, Bld: 91 mg/dL (ref 70–99)
Potassium: 4.7 mmol/L (ref 3.5–5.1)
Sodium: 138 mmol/L (ref 135–145)

## 2020-06-08 LAB — LACTATE DEHYDROGENASE: LDH: 210 U/L — ABNORMAL HIGH (ref 98–192)

## 2020-06-08 LAB — CBC
HCT: 39 % (ref 36.0–46.0)
Hemoglobin: 11.9 g/dL — ABNORMAL LOW (ref 12.0–15.0)
MCH: 30.4 pg (ref 26.0–34.0)
MCHC: 30.5 g/dL (ref 30.0–36.0)
MCV: 99.5 fL (ref 80.0–100.0)
Platelets: 234 10*3/uL (ref 150–400)
RBC: 3.92 MIL/uL (ref 3.87–5.11)
RDW: 15.8 % — ABNORMAL HIGH (ref 11.5–15.5)
WBC: 5.2 10*3/uL (ref 4.0–10.5)
nRBC: 0 % (ref 0.0–0.2)

## 2020-06-08 LAB — MAGNESIUM: Magnesium: 2 mg/dL (ref 1.7–2.4)

## 2020-06-08 MED ORDER — TRAMADOL HCL 50 MG PO TABS
100.0000 mg | ORAL_TABLET | Freq: Two times a day (BID) | ORAL | 3 refills | Status: AC | PRN
Start: 2020-06-08 — End: ?

## 2020-06-08 NOTE — Progress Notes (Signed)
LVAD Clinic Note  Patient ID: Brianna Hanson, female   DOB: 1934-10-02, 85 y.o.   MRN: [id number] Primary Heart Failure: Dr Haroldine Laws Cardiac Surgeon: Dr Prescott Gum  HPI: Brianna Hanson is a 85 y.o. 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 [name] (follwoed q 6 months). Underwent implantation of the HeartMate II LVAD on 04/06/13 for DT. She is not on aspirin due to dizziness. S/P lap cholecystitis 10/2015.   GI Issues 09/2016 LGIB. Angio- no source of bleeding. LVAD speed turned down to 8400  LVAD Speed Change 12/2016 Ramp ECHO -->Speed increased from 8400>8800  Significant Events on VAD Support:  09/21/16 GIB, Ramp echo speed decreased to 8400  01/08/17 - LOW FLOW alarms; speed increased to 8800 RPM 05/20/17>speed decreased to 8600 06/18/19 - DC-CV of AF   Had recurrent AF in 9/20. Amio increased to 200 bid. Did not tolerate well with increased low flows on VAD. Underwent successful DC-CV on 06/18/19.  Admitted 4/21 and underwent multi-level kyphoplasty.   She presents today for routine VAD f/u with [name]. Back and hip pain much improved after kyphoplasty and with use of CBD oil. Able to go out and do all ADLs without too much difficulty. Denies orthopnea or PND. No fevers, chills or problems with driveline. No bleeding, melena or neuro symptoms. No VAD alarms. Taking all meds as prescribed.   VAD Indication: Destination Therapy - age excluding and patient choice    VAD interrogation & Equipment Management: Speed: 8600 Flow: 4.1 Power: 4.4w PI: 6.5  Alarms: none Events: 20-40 events daily  Fixed speed 8600 Low speed limit: 8000  Primary Controller: Replace back up battery in 32 months  Back up controller: Replace back up battery in12 months   Annual Equipment Maintenance on UBC/PM was performed today 04/13/20.  Exit Site Care: Drive line is being maintained [employer].  Drive line exit site well healed and incorporated. The velour is fully implanted at exit site. Dressing dry and intact. No erythema or drainage. Stabilization device present and accurately applied. Pt denies fever or chills. Pt provided with 8 weekly dressing kits.   Significant Events on VAD Support:  09/21/16 GIB, Ramp echo speed decreased to 8400  01/08/17 - LOW FLOW alarms; speed increased to 8800 RPM 05/20/17>speed decreased to 8600     Past Medical History:  Diagnosis Date  . AICD (automatic cardioverter/defibrillator) present   . Anemia   . Atrial fibrillation or flutter   . Cardiac arrest - ventricular fibrillation 12/10   with successful resucitation, S/p ICD  . CHF (congestive heart failure) ([place])   . Diverticula of colon 2011  . HTN (hypertension)    moderate  . ICD (implantable cardiac defibrillator) in place    she has received appropriate therapy for VF  . Internal and external hemorrhoids without complication 4008  . Nonischemic cardiomyopathy ([place])    followed by Dr April Holding at [facility]  . Osteopenia   . Plasma cell disorder 03/20/2012  . Plasma cell disorder 03/20/2012  . Sessile colonic polyp 2011   Dr Benson Norway    Current Outpatient Medications  Medication Sig Dispense Refill  . amiodarone (PACERONE) 200 MG tablet TAKE 1 TABLET BY MOUTH EVERY DAY 90 tablet 3  . calcitonin, salmon, (MIACALCIN/FORTICAL) 200 UNIT/ACT nasal spray Place 1 spray into alternate nostrils daily. 3.7 mL 12  . carvedilol (COREG) 6.25 MG tablet TAKE  1 TABLET BY MOUTH 2 TIMES DAILY WITH A MEAL 180 tablet 3  . Cholecalciferol (VITAMIN D3) 25 MCG (1000 UT) CAPS Take 1,000 Units by mouth daily.     [name] docusate sodium (COLACE) 100 MG capsule Take 100 mg by mouth daily.    . ferrous sulfate 325 (65 FE) MG tablet Take 1 tablet (325 mg total) by mouth 2 (two) times daily with a meal. 60 tablet 6  . gabapentin (NEURONTIN) 100 MG capsule Take 3 capsules (300 mg total) by mouth 2 (two) times daily. (Patient  taking differently: Take 100 mg by mouth 2 (two) times daily. ) 180 capsule 3  . hydrALAZINE (APRESOLINE) 100 MG tablet Take 0.5 tablets (50 mg total) by mouth 3 (three) times daily. 90 tablet 9  . losartan (COZAAR) 50 MG tablet Take 1 tablet (50 mg total) by mouth daily. 90 tablet 3  . magnesium oxide (MAG-OX) 400 (241.3 Mg) MG tablet Take 0.5 tablets (200 mg total) by mouth 2 (two) times daily. 60 tablet 6  . pantoprazole (PROTONIX) 40 MG tablet Take 40 mg by mouth daily.    . potassium chloride SA (KLOR-CON M20) 20 MEQ tablet Take 1 tablet (20 mEq total) by mouth 2 (two) times daily. 180 tablet 3  . traMADol (ULTRAM) 50 MG tablet Take 2 tablets (100 mg total) by mouth every 12 (twelve) hours as needed for moderate pain. 60 tablet 3  . warfarin (COUMADIN) 2.5 MG tablet Take 5 mg (2 tabs) every Monday and 2.5 mg (1 tab) all other days or as directed by HF Clinic 180 tablet 3  . ondansetron (ZOFRAN) 4 MG tablet Take 1 tablet (4 mg total) by mouth every 8 (eight) hours as needed for nausea or vomiting. (Patient not taking: Reported on 06/08/2020) 50 tablet 4  . promethazine (PHENERGAN) 12.5 MG tablet Take 1 tablet (12.5 mg total) by mouth every 6 (six) hours as needed for nausea or vomiting. (Patient not taking: Reported on 06/08/2020) 30 tablet 4   Current Facility-Administered Medications  Medication Dose Route Frequency Provider Last Rate Last Admin  . Romosozumab-aqqg (EVENITY) 105 MG/1.17ML injection 210 mg  210 mg Subcutaneous Q30 days Janith Lima, MD       Allergies  Allergen Reactions  . Oxycodone Other (See Comments)    Hallucinations     Review of systems complete and found to be negative unless listed in HPI.     Vital Signs:  Doppler Pressure:  128 Automatc BP: 119/91 (101) HR: 83 SPO2:  97% on RA  Weight: 168 lb w/o eqt  Last weight: 168.8 lbs  General:  NAD.  HEENT: normal  Neck: supple. JVP not elevated.  Carotids 2+ bilat; no bruits. No lymphadenopathy or  thryomegaly appreciated. Cor: LVAD hum.  Lungs: Clear. Abdomen: soft, nontender, non-distended. No hepatosplenomegaly. No bruits or masses. Good bowel sounds. Driveline site clean. Anchor in place.  Extremities: no cyanosis, clubbing, rash. Warm no edema  Neuro: alert & oriented x 3. No focal deficits. Moves all 4 without problem     ASSESSMENT AND PLAN:  1) Low back and left hip pain in setting of L1 compression fracture and new L3 fracture -  Much improved s/p multi-level kyphoplasty 4/21 and with use of CBD oil bid  2) Chronic systolic HF: NICM, s/p ICD and LVAD for DT(04/2013).  - now > 7 years out on VAD support - stable NYHA II - Volume status ok. Continue to keep fluid intake up to avoid low  flows  3) LVAD placed for DT 04/2013:  - Failed Entresto due to diuresis and incessant low flow alarms.  - Continue losartan 100 mg daily.  - VAD interrogated personally. Parameters stable. - No ASA with GI bleed and intolerance.  - Continue coumadin. No recent bleeding - LDH 210 - INR 2.0 Discussed dosing with PharmD personally. Try to keep 1.8-2.3 - Hgb 11.9  4) PAF:  - Did not tolerate AF well at all.  - S/p DC-CV 06/18/19 - Remains in NSR on amiodarone 200 daily.  - Continue warfarin - no change  5) HTN:  -MAPs run a bit high but doesn't tolerate lower MAPs well.No change  6) h/o BRBPR - suspect diverticulosis. Had episode in 09/2016.  - 12/2009 Colonoscopy, Dr Benson Norway: descending adenoma and pan-diverticulosis > in sigmoid, int/ext hemorrhoids.  - hgb 12.7 -> 11.7 -> 11.4 -> 11.0 -> 11.9 . MCV ok  - Bleeding has stopped - Has seen GI. Ee have decided not to pursue repeat colonoscopy at this time given age and comorbidities If bleeding persists can revisit  Total time spent 35 minutes. Over half that time spent discussing above.    Glori Bickers, MD 10:41 PM

## 2020-06-08 NOTE — Progress Notes (Signed)
Patient presents for 2 mo f/u visit visit today in [facility] today with caregiver, [name]. Reports no problems with VAD equipment or concerns with drive line.  Pt reports she is feeling much better. Reports slight right hip pain. She arrived using walker today, did not require w/c. She reports pain is much improved after starting CBD oil twice daily. Now receiving shot at PCP office for bone strength.   Vital Signs:  Doppler Pressure:  128 Automatc BP: 119/91 (101) HR: 83 SPO2:  97% on RA  Weight: 168 lb w/o eqt  Last weight: 168.8 lbs  VAD Indication: Destination Therapy - age excluding and patient choice    VAD interrogation & Equipment Management: Speed: 8600 Flow: 4.1 Power: 4.4w    PI: 6.5  Alarms: none Events: 20-40 events daily  Fixed speed 8600 Low speed limit: 8000  Primary Controller: Replace back up battery in 32 months  Back up controller: Replace back up battery in 12 months   Annual Equipment Maintenance on UBC/PM was performed today 04/13/20.  Exit Site Care: Drive line is being maintained weekly by [employer]. Drive line exit site well healed and incorporated. The velour is fully implanted at exit site. Dressing dry and intact. No erythema or drainage. Stabilization device present and accurately applied. Pt denies fever or chills. Pt provided with 8 weekly dressing kits   Significant Events on VAD Support:  09/21/16 GIB, Ramp echo speed decreased to 8400  01/08/17 - LOW FLOW alarms; speed increased to 8800 RPM 05/20/17> speed decreased to 8600  Device:St Jude ICD Therapies: on at 200 bpm; monitor on 110 DDD 60 Last check: today: 08/03/19  BP & Labs: Doppler BP 128- is reflecting modified systolic  Hgb 75.1 - Reports bright red bleeding with stools once this week; has since resolved. Denies nosebleeds  LDH 210 - and is within established range is 150 - 300. Denies any dark urine.   Patient Instructions: 1. No change in meds. 2. Return to [facility] in 2 months.  Tanda Rockers RN [place] Coordinator  Office: [phone]  24/7 Pager: [phone]

## 2020-06-08 NOTE — Patient Instructions (Signed)
1. No change in meds. 2. Return to [facility] in 2 months.

## 2020-06-13 ENCOUNTER — Ambulatory Visit (HOSPITAL_COMMUNITY): Payer: Self-pay | Admitting: Pharmacist

## 2020-06-13 DIAGNOSIS — Z7901 Long term (current) use of anticoagulants: Secondary | ICD-10-CM | POA: Diagnosis not present

## 2020-06-13 LAB — POCT INR: INR: 2.4 (ref 2.0–3.0)

## 2020-06-13 NOTE — Progress Notes (Signed)
LVAD INR 

## 2020-06-20 ENCOUNTER — Ambulatory Visit (HOSPITAL_COMMUNITY): Payer: Self-pay | Admitting: Pharmacist

## 2020-06-20 LAB — POCT INR: INR: 2.2 (ref 2.0–3.0)

## 2020-06-20 NOTE — Progress Notes (Signed)
LVAD INR 

## 2020-06-26 ENCOUNTER — Other Ambulatory Visit (HOSPITAL_COMMUNITY): Payer: Self-pay | Admitting: Internal Medicine

## 2020-06-27 ENCOUNTER — Ambulatory Visit (HOSPITAL_COMMUNITY): Payer: Self-pay | Admitting: Pharmacist

## 2020-06-27 LAB — POCT INR: INR: 1.9 — AB (ref 2.0–3.0)

## 2020-06-27 NOTE — Progress Notes (Signed)
LVAD INR 

## 2020-06-29 ENCOUNTER — Ambulatory Visit (HOSPITAL_COMMUNITY)
Admission: RE | Admit: 2020-06-29 | Discharge: 2020-06-29 | Disposition: A | Discharge status: Home / Self Care | Payer: Medicare Other | Source: Ambulatory Visit | Attending: Internal Medicine | Admitting: Internal Medicine

## 2020-06-29 ENCOUNTER — Other Ambulatory Visit (HOSPITAL_COMMUNITY): Payer: Self-pay | Admitting: Unknown Physician Specialty

## 2020-06-29 ENCOUNTER — Other Ambulatory Visit: Payer: Self-pay

## 2020-06-29 ENCOUNTER — Telehealth (HOSPITAL_COMMUNITY): Payer: Self-pay | Admitting: *Deleted

## 2020-06-29 ENCOUNTER — Other Ambulatory Visit (HOSPITAL_COMMUNITY): Payer: Self-pay | Admitting: Internal Medicine

## 2020-06-29 DIAGNOSIS — Z95811 Presence of heart assist device: Secondary | ICD-10-CM | POA: Insufficient documentation

## 2020-06-29 DIAGNOSIS — Z7901 Long term (current) use of anticoagulants: Secondary | ICD-10-CM | POA: Insufficient documentation

## 2020-06-29 DIAGNOSIS — I10 Essential (primary) hypertension: Secondary | ICD-10-CM

## 2020-06-29 LAB — CBC
HCT: 34 % — ABNORMAL LOW (ref 36.0–46.0)
Hemoglobin: 10.4 g/dL — ABNORMAL LOW (ref 12.0–15.0)
MCH: 30.7 pg (ref 26.0–34.0)
MCHC: 30.6 g/dL (ref 30.0–36.0)
MCV: 100.3 fL — ABNORMAL HIGH (ref 80.0–100.0)
Platelets: 217 10*3/uL (ref 150–400)
RBC: 3.39 MIL/uL — ABNORMAL LOW (ref 3.87–5.11)
RDW: 16.4 % — ABNORMAL HIGH (ref 11.5–15.5)
WBC: 6.9 10*3/uL (ref 4.0–10.5)
nRBC: 0 % (ref 0.0–0.2)

## 2020-06-29 NOTE — Telephone Encounter (Signed)
Pt called VAD office to report small amount of bright red blood in stools over last few days. She reports 2 stools per day with blood in each. She denies constipation or hx of hemorrhoids. She says she "feels great" and denies any LVAD low flows. She says she has plans for vacation over next week. Per [name], asked pt to come to clinic today for baseline CBC. She verbalized agreement to same.   Zada Girt RN, [place] Coordinator [phone]

## 2020-07-04 ENCOUNTER — Ambulatory Visit (HOSPITAL_COMMUNITY): Payer: Self-pay | Admitting: Pharmacist

## 2020-07-04 LAB — POCT INR: INR: 2.6 (ref 2.0–3.0)

## 2020-07-04 NOTE — Progress Notes (Signed)
LVAD INR 

## 2020-07-07 ENCOUNTER — Other Ambulatory Visit: Payer: Self-pay

## 2020-07-07 ENCOUNTER — Ambulatory Visit (INDEPENDENT_AMBULATORY_CARE_PROVIDER_SITE_OTHER): Payer: Medicare Other

## 2020-07-07 DIAGNOSIS — M8000XG Age-related osteoporosis with current pathological fracture, unspecified site, subsequent encounter for fracture with delayed healing: Secondary | ICD-10-CM

## 2020-07-07 MED ORDER — ROMOSOZUMAB-AQQG 105 MG/1.17ML ~~LOC~~ SOSY
210.0000 mg | PREFILLED_SYRINGE | Freq: Once | SUBCUTANEOUS | Status: AC
Start: 2020-07-07 — End: 2020-07-07
  Administered 2020-07-07: 210 mg via SUBCUTANEOUS

## 2020-07-07 NOTE — Progress Notes (Addendum)
Both Evenity shots were given w/o any complications.

## 2020-07-09 ENCOUNTER — Other Ambulatory Visit (HOSPITAL_COMMUNITY): Payer: Self-pay | Admitting: Internal Medicine

## 2020-07-11 ENCOUNTER — Ambulatory Visit (HOSPITAL_COMMUNITY): Payer: Self-pay | Admitting: Pharmacist

## 2020-07-11 DIAGNOSIS — Z7901 Long term (current) use of anticoagulants: Secondary | ICD-10-CM | POA: Diagnosis not present

## 2020-07-11 LAB — POCT INR: INR: 2.5 (ref 2.0–3.0)

## 2020-07-11 NOTE — Progress Notes (Signed)
LVAD INR 

## 2020-07-18 ENCOUNTER — Ambulatory Visit (HOSPITAL_COMMUNITY): Payer: Self-pay | Admitting: Pharmacist

## 2020-07-18 LAB — POCT INR: INR: 2.5 (ref 2.0–3.0)

## 2020-07-18 NOTE — Progress Notes (Signed)
LVAD INR 

## 2020-07-25 ENCOUNTER — Ambulatory Visit (HOSPITAL_COMMUNITY): Payer: Self-pay | Admitting: Pharmacist

## 2020-07-25 LAB — POCT INR: INR: 2.5 (ref 2.0–3.0)

## 2020-07-25 NOTE — Progress Notes (Signed)
LVAD INR 

## 2020-08-01 ENCOUNTER — Ambulatory Visit (HOSPITAL_COMMUNITY): Payer: Self-pay | Admitting: Pharmacist

## 2020-08-01 LAB — POCT INR: INR: 3 (ref 2.0–3.0)

## 2020-08-01 NOTE — Progress Notes (Signed)
LVAD INR 

## 2020-08-07 ENCOUNTER — Ambulatory Visit (INDEPENDENT_AMBULATORY_CARE_PROVIDER_SITE_OTHER): Payer: Medicare Other

## 2020-08-07 ENCOUNTER — Other Ambulatory Visit: Payer: Self-pay

## 2020-08-07 DIAGNOSIS — M8000XG Age-related osteoporosis with current pathological fracture, unspecified site, subsequent encounter for fracture with delayed healing: Secondary | ICD-10-CM | POA: Diagnosis not present

## 2020-08-07 NOTE — Progress Notes (Signed)
Eventiy total of 210mg  given subcutaneous per order of Dr Ronnald Ramp; 105mg  given each arm. Pt tolerated well.  Pt states her R arm is sore in the muscle from the flu shot & states she has an appt to get it looked at. Pt instructed to make 1 mo appt for next EVenity.

## 2020-08-08 ENCOUNTER — Ambulatory Visit (HOSPITAL_COMMUNITY): Payer: Self-pay | Admitting: Pharmacist

## 2020-08-08 ENCOUNTER — Other Ambulatory Visit (HOSPITAL_COMMUNITY): Payer: Self-pay | Admitting: *Deleted

## 2020-08-08 DIAGNOSIS — Z7901 Long term (current) use of anticoagulants: Secondary | ICD-10-CM

## 2020-08-08 DIAGNOSIS — Z95811 Presence of heart assist device: Secondary | ICD-10-CM

## 2020-08-08 LAB — POCT INR: INR: 2.4 (ref 2.0–3.0)

## 2020-08-08 NOTE — Progress Notes (Signed)
LVAD INR 

## 2020-08-09 ENCOUNTER — Other Ambulatory Visit: Payer: Self-pay

## 2020-08-09 ENCOUNTER — Encounter (HOSPITAL_COMMUNITY): Payer: Self-pay

## 2020-08-09 ENCOUNTER — Ambulatory Visit (HOSPITAL_COMMUNITY)
Admission: RE | Admit: 2020-08-09 | Discharge: 2020-08-09 | Disposition: A | Discharge status: Home / Self Care | Payer: Medicare Other | Source: Ambulatory Visit | Attending: Internal Medicine | Admitting: Internal Medicine

## 2020-08-09 VITALS — BP 119/80 | HR 81 | Wt 169.4 lb

## 2020-08-09 DIAGNOSIS — M4856XA Collapsed vertebra, not elsewhere classified, lumbar region, initial encounter for fracture: Secondary | ICD-10-CM | POA: Insufficient documentation

## 2020-08-09 DIAGNOSIS — I5022 Chronic systolic (congestive) heart failure: Secondary | ICD-10-CM | POA: Insufficient documentation

## 2020-08-09 DIAGNOSIS — Z95811 Presence of heart assist device: Secondary | ICD-10-CM | POA: Diagnosis not present

## 2020-08-09 DIAGNOSIS — Y929 Unspecified place or not applicable: Secondary | ICD-10-CM | POA: Diagnosis not present

## 2020-08-09 DIAGNOSIS — M25552 Pain in left hip: Secondary | ICD-10-CM | POA: Insufficient documentation

## 2020-08-09 DIAGNOSIS — Y939 Activity, unspecified: Secondary | ICD-10-CM | POA: Diagnosis not present

## 2020-08-09 DIAGNOSIS — I110 Hypertensive heart disease with heart failure: Secondary | ICD-10-CM | POA: Insufficient documentation

## 2020-08-09 DIAGNOSIS — I428 Other cardiomyopathies: Secondary | ICD-10-CM | POA: Diagnosis not present

## 2020-08-09 DIAGNOSIS — M25511 Pain in right shoulder: Secondary | ICD-10-CM | POA: Insufficient documentation

## 2020-08-09 DIAGNOSIS — Z79899 Other long term (current) drug therapy: Secondary | ICD-10-CM | POA: Insufficient documentation

## 2020-08-09 DIAGNOSIS — Z8719 Personal history of other diseases of the digestive system: Secondary | ICD-10-CM | POA: Insufficient documentation

## 2020-08-09 DIAGNOSIS — I480 Paroxysmal atrial fibrillation: Secondary | ICD-10-CM | POA: Insufficient documentation

## 2020-08-09 DIAGNOSIS — Z7901 Long term (current) use of anticoagulants: Secondary | ICD-10-CM | POA: Insufficient documentation

## 2020-08-09 DIAGNOSIS — I10 Essential (primary) hypertension: Secondary | ICD-10-CM

## 2020-08-09 LAB — CBC
HCT: 34.3 % — ABNORMAL LOW (ref 36.0–46.0)
Hemoglobin: 10.6 g/dL — ABNORMAL LOW (ref 12.0–15.0)
MCH: 30.9 pg (ref 26.0–34.0)
MCHC: 30.9 g/dL (ref 30.0–36.0)
MCV: 100 fL (ref 80.0–100.0)
Platelets: 227 10*3/uL (ref 150–400)
RBC: 3.43 MIL/uL — ABNORMAL LOW (ref 3.87–5.11)
RDW: 16.1 % — ABNORMAL HIGH (ref 11.5–15.5)
WBC: 7.5 10*3/uL (ref 4.0–10.5)
nRBC: 0.3 % — ABNORMAL HIGH (ref 0.0–0.2)

## 2020-08-09 LAB — PROTIME-INR
INR: 2.4 — ABNORMAL HIGH (ref 0.8–1.2)
Prothrombin Time: 25.4 seconds — ABNORMAL HIGH (ref 11.4–15.2)

## 2020-08-09 LAB — BASIC METABOLIC PANEL
Anion gap: 10 (ref 5–15)
BUN: 17 mg/dL (ref 8–23)
CO2: 21 mmol/L — ABNORMAL LOW (ref 22–32)
Calcium: 9.8 mg/dL (ref 8.9–10.3)
Chloride: 106 mmol/L (ref 98–111)
Creatinine, Ser: 1.02 mg/dL — ABNORMAL HIGH (ref 0.44–1.00)
GFR, Estimated: 55 mL/min — ABNORMAL LOW (ref >60)
Glucose, Bld: 96 mg/dL (ref 70–99)
Potassium: 4.8 mmol/L (ref 3.5–5.1)
Sodium: 137 mmol/L (ref 135–145)

## 2020-08-09 LAB — MAGNESIUM: Magnesium: 2 mg/dL (ref 1.7–2.4)

## 2020-08-09 LAB — LACTATE DEHYDROGENASE: LDH: 265 U/L — ABNORMAL HIGH (ref 98–192)

## 2020-08-09 NOTE — Patient Instructions (Signed)
1. No medication changes today 2. Coumadin dosing per Ander Purpura PharmD 3. We will call and get you an appointment with Sports Medicine to ultrasound your arm 4. Return to [place] [facility] in 2 months for follow up appointment. We will do your 7.5 yr Intermacs at this appointment.

## 2020-08-09 NOTE — Progress Notes (Signed)
Patient presents for 2 mo f/u visit visit today in [facility] today with caregiver, [name]. Reports no problems with VAD equipment or concerns with drive line.  Pt reports she is feeling much better. She walked into clinic today using her walker. Reports her back pain is much improved, with minimal intermittent hip pain. Receiving EVENITY shot at PCP office for bone strength. Denies lightheadedness, dizziness, shortness of breath, or falls.  Reports significant right upper arm pain since receiving flu shot and Evenity injection in October. She states "they told me they gave me the Evenity in the wrong place." She is unable to lift her right arm unassisted. Per Dr Haroldine Laws will send her to see Dr Micheline Chapman (sports medicine) for ultrasound. Appt made for tomorrow at 10:30.   Denies BRBPR since episode at the end of October. Reports at times when she eats nuts/popcorn there is scant blood in her stool, but attributes this to diverticulitis.   Reports she received COVID booster injection the week before Thanksgiving with no side effects.   Vital Signs:  Doppler Pressure:  122 Automatc BP: 119/80 (92) HR: 81 SPO2:  97% on RA  Weight: 169.4 lb w/o eqt  Last weight: 168 lbs  VAD Indication: Destination Therapy - age excluding and patient choice    VAD interrogation & Equipment Management: Speed: 8600 Flow: 4.4 Power: 4.5w    PI: 6.4  Alarms: none Events: 20-40 events daily  100+ PI on 08/03/20- reports doing a lot of housework that day  Fixed speed 8600 Low speed limit: 8000  Primary Controller: Replace back up battery in 26 months  Back up controller: Replace back up battery in 12 months   Annual Equipment Maintenance on UBC/PM was performed today 04/13/20.  Exit Site Care: Drive line is being maintained weekly by [employer]. Drive line exit site well healed and incorporated. The velour is fully implanted at exit site. Dressing dry and intact. No erythema or drainage. Stabilization  device present and accurately applied. Pt denies fever or chills. Pt provided with 8 weekly dressing kits   Significant Events on VAD Support:  09/21/16 GIB, Ramp echo speed decreased to 8400  01/08/17 - LOW FLOW alarms; speed increased to 8800 RPM 05/20/17> speed decreased to 8600  Device:St Jude ICD Therapies: on at 200 bpm; monitor on 110 DDD 60 Last check: today: 08/03/19  BP & Labs: Doppler BP 122- is reflecting modified systolic  Hgb 25.4 - Denies signs/symptoms of bleeding. Specifically denies BRBPR. Denies nosebleeds  LDH 265 - and is within established range is 150 - 300. Denies any dark urine.  Patient Instructions: 1. No medication changes today 2. Coumadin dosing per Ander Purpura PharmD 3. We will call and get you an appointment with Sports Medicine to ultrasound your arm- scheduled 08/10/20 at 10:30 4. Return to [place] clinic in 2 months for follow up appointment. We will do your 7.5 yr Intermacs at this appointment.

## 2020-08-09 NOTE — Progress Notes (Signed)
LVAD Clinic Note  Patient ID: Brianna Hanson, female   DOB: 11/10/1934, 85 y.o.   MRN: [id number] Primary Heart Failure: Dr Haroldine Laws Cardiac Surgeon: Dr Prescott Gum  HPI: Brianna Hanson is a 85 y.o. 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 [name] (follwoed q 6 months). Underwent implantation of the HeartMate II LVAD on 04/06/13 for DT. She is not on aspirin due to dizziness. S/P lap cholecystitis 10/2015.   GI Issues 09/2016 LGIB. Angio- no source of bleeding. LVAD speed turned down to 8400  LVAD Speed Change 12/2016 Ramp ECHO -->Speed increased from 8400>8800  Significant Events on VAD Support:  09/21/16 GIB, Ramp echo speed decreased to 8400  01/08/17 - LOW FLOW alarms; speed increased to 8800 RPM 05/20/17>speed decreased to 8600 06/18/19 - DC-CV of AF   Had recurrent AF in 9/20. Amio increased to 200 bid. Did not tolerate well with increased low flows on VAD. Underwent successful DC-CV on 06/18/19.  Admitted 4/21 and underwent multi-level kyphoplasty.   She presents today for routine VAD f/u with [name]. Feels much better. More active again. Only complaint today is pain and limited ROM in R shoulder after flu vaccine. Denies orthopnea or PND. No fevers, chills or problems with driveline. No bleeding, melena or neuro symptoms. No VAD alarms. Taking all meds as prescribed.    VAD Indication: Destination Therapy - age excluding and patient choice    VAD interrogation & Equipment Management: Speed: 8600 Flow: 4.4 Power: 4.5w PI: 6.4  Alarms: none Events: 20-40 events daily  100+ PI on 08/03/20- reports doing a lot of housework that day  Fixed speed 8600 Low speed limit: 8000  Primary Controller: Replace back up battery in 26 months  Back up controller: Replace back up battery in12 months  Exit Site Care: Drive line is being maintained [employer]. Drive line exit site well  healed and incorporated. The velour is fully implanted at exit site. Dressing dry and intact. No erythema or drainage. Stabilization device present and accurately applied. Pt denies fever or chills. Pt provided with 8 weekly dressing kits.   Significant Events on VAD Support:  09/21/16 GIB, Ramp echo speed decreased to 8400  01/08/17 - LOW FLOW alarms; speed increased to 8800 RPM 05/20/17>speed decreased to 8600     Past Medical History:  Diagnosis Date  . AICD (automatic cardioverter/defibrillator) present   . Anemia   . Atrial fibrillation or flutter   . Cardiac arrest - ventricular fibrillation 12/10   with successful resucitation, S/p ICD  . CHF (congestive heart failure) ([place])   . Diverticula of colon 2011  . HTN (hypertension)    moderate  . ICD (implantable cardiac defibrillator) in place    she has received appropriate therapy for VF  . Internal and external hemorrhoids without complication 4098  . Nonischemic cardiomyopathy ([place])    [facility] Dr April Holding [facility]  . Osteopenia   . Plasma cell disorder 03/20/2012  . Plasma cell disorder 03/20/2012  . Sessile colonic polyp 2011   Dr Benson Norway    Current Outpatient Medications  Medication Sig Dispense Refill  . amiodarone (PACERONE) 200 MG tablet TAKE 1 TABLET BY MOUTH EVERY DAY 90 tablet 3  . calcitonin, salmon, (MIACALCIN/FORTICAL) 200 UNIT/ACT nasal spray Place 1 spray into alternate nostrils daily. 3.7 mL 12  . carvedilol (COREG) 6.25 MG tablet TAKE 1 TABLET BY  MOUTH 2 TIMES DAILY WITH A MEAL 180 tablet 3  . Cholecalciferol (VITAMIN D3) 25 MCG (1000 UT) CAPS Take 1,000 Units by mouth daily.     [name] docusate sodium (COLACE) 100 MG capsule Take 100 mg by mouth daily.    . ferrous sulfate 325 (65 FE) MG tablet Take 1 tablet (325 mg total) by mouth 2 (two) times daily with a meal. 60 tablet 6  . gabapentin (NEURONTIN) 100 MG capsule Take 3 capsules (300 mg total) by mouth 2 (two) times daily. (Patient taking differently: Take  100 mg by mouth 2 (two) times daily. ) 180 capsule 3  . hydrALAZINE (APRESOLINE) 100 MG tablet Take 0.5 tablets (50 mg total) by mouth 3 (three) times daily. 90 tablet 9  . losartan (COZAAR) 50 MG tablet Take 1 tablet (50 mg total) by mouth daily. 90 tablet 3  . magnesium oxide (MAG-OX) 400 (241.3 Mg) MG tablet Take 0.5 tablets (200 mg total) by mouth 2 (two) times daily. 60 tablet 6  . potassium chloride SA (KLOR-CON M20) 20 MEQ tablet Take 1 tablet (20 mEq total) by mouth 2 (two) times daily. 180 tablet 3  . traMADol (ULTRAM) 50 MG tablet Take 2 tablets (100 mg total) by mouth every 12 (twelve) hours as needed for moderate pain. 60 tablet 3  . warfarin (COUMADIN) 2.5 MG tablet Take 5 mg (2 tabs) every Monday and 2.5 mg (1 tab) all other days or as directed by HF Clinic 180 tablet 3  . ondansetron (ZOFRAN) 4 MG tablet Take 1 tablet (4 mg total) by mouth every 8 (eight) hours as needed for nausea or vomiting. (Patient not taking: Reported on 06/08/2020) 50 tablet 4  . pantoprazole (PROTONIX) 40 MG tablet TAKE 1 TABLET BY MOUTH EVERY DAY (Patient not taking: Reported on 08/09/2020) 90 tablet 3  . promethazine (PHENERGAN) 12.5 MG tablet Take 1 tablet (12.5 mg total) by mouth every 6 (six) hours as needed for nausea or vomiting. (Patient not taking: Reported on 06/08/2020) 30 tablet 4   Current Facility-Administered Medications  Medication Dose Route Frequency Provider Last Rate Last Admin  . Romosozumab-aqqg (EVENITY) 105 MG/1.17ML injection 210 mg  210 mg Subcutaneous Q30 days Janith Lima, MD   210 mg at 08/07/20 1549   Allergies  Allergen Reactions  . Oxycodone Other (See Comments)    Hallucinations     Review of systems complete and found to be negative unless listed in HPI.     Vital Signs:  Doppler Pressure:  122 Automatc BP: 119/80 (92) HR: 81 SPO2:  97% on RA  Weight: 169.4 lb w/o eqt  Last weight: 168 lbs  General:  NAD.  HEENT: normal  Neck: supple. JVP not elevated.   Carotids 2+ bilat; no bruits. No lymphadenopathy or thryomegaly appreciated. Cor: LVAD hum.  Lungs: Clear. Abdomen: obese soft, nontender, non-distended. No hepatosplenomegaly. No bruits or masses. Good bowel sounds. Driveline site clean. Anchor in place.  Extremities: no cyanosis, clubbing, rash. Warm no edema  Unable to lift R shoulder fully. No heamtoma Neuro: alert & oriented x 3. No focal deficits. Moves all 4 without problem     ASSESSMENT AND PLAN:  1) Low back and left hip pain in setting of L1 compression fracture and new L3 fracture -  Much improved s/p multi-level kyphoplasty 4/21 and with use of CBD oil bid - no change  2) Chronic systolic HF: NICM, s/p ICD and LVAD for DT(04/2013).  - now > 7 years out  on VAD support - improved NYHA II - Volume status ok. Continue to keep fluid intake up to avoid low flows  3) LVAD placed for DT 04/2013:  - Failed Entresto due to diuresis and incessant low flow alarms.  - Continue losartan 100 mg daily.  - VAD interrogated personally. Parameters stable. - No ASA with GI bleed and intolerance.  - Continue coumadin. No recent bleeding - LDH 265 - INR 2.4 Discussed dosing with PharmD personally.Try to keep 1.8-2.3 - Hgb 10.6  4) PAF:  - Did not tolerate AF well at all.  - S/p DC-CV 06/18/19 - Remains on amiodarone 200 daily.  - Continue warfarin - no change  5) HTN:  -MAPs run a bit high but doesn't tolerate lower MAPs well.No change  6) h/o BRBPR - suspect diverticulosis. Had episode in 09/2016.  - 12/2009 Colonoscopy, Dr Benson Norway: descending adenoma and pan-diverticulosis > in sigmoid, int/ext hemorrhoids.  - hgb 12.7 -> 11.7 -> 11.4 -> 11.0 -> 11.9 -> 10.6.  - Bleeding has stopped - Has seen GI. We have decided not to pursue repeat colonoscopy at this time given age and comorbidities If bleeding persists can revisit  7) R shoulder pain post flu vaccine - refer to Sportsmedicine  Total time spent 35 minutes. Over half that time  spent discussing above.    Glori Bickers, MD 10:57 AM

## 2020-08-10 ENCOUNTER — Ambulatory Visit (INDEPENDENT_AMBULATORY_CARE_PROVIDER_SITE_OTHER): Payer: Medicare Other | Admitting: Sports Medicine

## 2020-08-10 VITALS — BP 109/79 | Ht 60.0 in | Wt 169.0 lb

## 2020-08-10 DIAGNOSIS — M75111 Incomplete rotator cuff tear or rupture of right shoulder, not specified as traumatic: Secondary | ICD-10-CM | POA: Diagnosis not present

## 2020-08-10 MED ORDER — METHYLPREDNISOLONE ACETATE 40 MG/ML IJ SUSP
40.0000 mg | Freq: Once | INTRAMUSCULAR | Status: AC
  Administered 2020-08-10: 40 mg via INTRA_ARTICULAR

## 2020-08-10 NOTE — Progress Notes (Signed)
Office Visit Note   Patient: Brianna Hanson           Date of Birth: August 10, 1935           MRN: [id number] Visit Date: 08/10/2020 Requested by: Janith Lima, MD [place],  [place] [id number] PCP: Jolaine Artist, MD  Subjective: CC: Right Shoulder Ultrasound  HPI: 85 year old female presenting to clinic today for a right shoulder ultrasound.  Patient states that she has approximately 1 month of severe right-sided shoulder pain, which she attributes to getting a flu shot in this arm.  She says that she has significant pain and weakness with trying to lift her arm forward or overhead, and this is causing marked limitation throughout her day-to the point where she has her husband help her with simple daily living activities.  She denies any significant history of trauma to the shoulder, and says the only thing she can think of was her injections (influenza  And SubQ Eventiy) prior to the onset of her pain.  Denies any numbness or radiation of the pain distal in her arm or hand.  Denies any neck pain.  She denies any significant redness or swelling to the area of either injection.  No fever chills, or other systemic symptom.              ROS:   All other systems were reviewed and are negative.  Objective: Vital Signs: BP 109/79   Ht 5' (1.524 m)   Wt 169 lb (76.7 kg)   BMI 33.01 kg/m   Physical Exam:  General:  Alert and oriented, in no acute distress. Pulm:  Breathing unlabored. Psy:  Normal mood, congruent affect. Skin: Right shoulder with no bruising, rashes, erythema.  Overlying skin intact.  No significant warmth, fluctuance, or induration. Right shoulder Exam:  Inspection: Symmetric muscle mass, no atrophy or deformity, no scars. Palpation: Significant tenderness palpation along the subacromial area, as well as over the right biceps tendon.  Also with marked tenderness over the right AC joint.  No significant tenderness over posterior shoulder or superior  trapezius.  Range of motion: Range of motion severely limited in forward flexion as well as abduction, with patient unable to tolerate even passive abduction beyond approximately 60 degrees.  Extension, however, remains intact.  Rotator cuff testing:  Significant pain and weakness with empty can. External rotation with some weakness however no significant pain. Internal rotation with moderate pain and weakness.  Strength testing:  3 out of 5 strength with shoulder abduction (C5). 5 out of 5 strength with wrist extension (C6), wrist flexion (C7), grip strength (C8), and finger abduction (T1).  Sensation: Intact to light touch throughout bilateral upper extremities.   Brisk distal capillary refill.   Imaging: Limited extremity ultrasound: Right shoulder Biceps tendon visualized in long and short axis without significant surrounding fluid. Subscapularis and infraspinatus appear to be intact without significant intra body irregularities or associated cortical irregularities.  No significant surrounding fluid. Supraspinatus difficult to visualize due to patient tolerance of positioning, however there are marked cortical irregularities as well as fluid accumulation within the area.  No significant bursal swelling. AC joint with moderate effusion. Glenohumeral joint with mild effusion.  Moderate spurring.  Assessment & Plan: 85 year old female presenting to clinic with concerns of severe right-sided shoulder pain following a flu injection approximately 1 month ago.  Ultrasound as above, which is concerning for supraspinatus tear.  Unable to determine full versus partial tear as patient poorly  tolerated the positioning of the scan.   - Will start therapy with subacromial injection, which patient tolerated very well.   -She was educated on home exercises to perform to improve her shoulder range of motion and strength. -If no improvement at follow up appointment (4-6wks) would consider  MRI -Patient expresses understanding and has no further questions or concerns today.   Patient seen and evaluated with the sports medicine fellow.  I agree with the above plan of care.  Ultrasound evaluation today was difficult due to patient's pain and difficulty tolerating the position necessary for a good scan.  However, there are indirect signs of a supraspinatus tear.  We will start with a subacromial cortisone injection today and couple it with a home exercise program consisting of range of motion exercises.  Patient should follow up with me in 3 to 4 weeks for reevaluation.  If pain and weakness persist then consider further diagnostic imaging at that time.

## 2020-08-15 ENCOUNTER — Ambulatory Visit (HOSPITAL_COMMUNITY): Payer: Self-pay | Admitting: Pharmacist

## 2020-08-15 LAB — POCT INR: INR: 2.8 (ref 2.0–3.0)

## 2020-08-15 NOTE — Progress Notes (Signed)
LVAD INR 

## 2020-08-21 ENCOUNTER — Other Ambulatory Visit: Payer: Self-pay

## 2020-08-21 ENCOUNTER — Emergency Department (HOSPITAL_COMMUNITY)
Admission: EM | Admit: 2020-08-21 | Discharge: 2020-08-21 | Disposition: A | Discharge status: Home / Self Care | Payer: Medicare Other | Attending: Emergency Medicine | Admitting: Emergency Medicine

## 2020-08-21 ENCOUNTER — Emergency Department (HOSPITAL_COMMUNITY): Payer: Medicare Other

## 2020-08-21 ENCOUNTER — Ambulatory Visit (HOSPITAL_COMMUNITY)
Admission: EM | Admit: 2020-08-21 | Discharge: 2020-08-21 | Disposition: A | Discharge status: Home / Self Care | Payer: Medicare Other

## 2020-08-21 ENCOUNTER — Encounter (HOSPITAL_COMMUNITY): Payer: Self-pay

## 2020-08-21 DIAGNOSIS — R059 Cough, unspecified: Secondary | ICD-10-CM | POA: Diagnosis not present

## 2020-08-21 DIAGNOSIS — Z7901 Long term (current) use of anticoagulants: Secondary | ICD-10-CM | POA: Insufficient documentation

## 2020-08-21 DIAGNOSIS — Z95810 Presence of automatic (implantable) cardiac defibrillator: Secondary | ICD-10-CM | POA: Insufficient documentation

## 2020-08-21 DIAGNOSIS — I517 Cardiomegaly: Secondary | ICD-10-CM | POA: Diagnosis not present

## 2020-08-21 DIAGNOSIS — Z95811 Presence of heart assist device: Secondary | ICD-10-CM

## 2020-08-21 DIAGNOSIS — J069 Acute upper respiratory infection, unspecified: Secondary | ICD-10-CM | POA: Diagnosis not present

## 2020-08-21 DIAGNOSIS — N182 Chronic kidney disease, stage 2 (mild): Secondary | ICD-10-CM | POA: Diagnosis not present

## 2020-08-21 DIAGNOSIS — I4891 Unspecified atrial fibrillation: Secondary | ICD-10-CM | POA: Diagnosis not present

## 2020-08-21 DIAGNOSIS — I130 Hypertensive heart and chronic kidney disease with heart failure and stage 1 through stage 4 chronic kidney disease, or unspecified chronic kidney disease: Secondary | ICD-10-CM | POA: Diagnosis not present

## 2020-08-21 DIAGNOSIS — Z20822 Contact with and (suspected) exposure to covid-19: Secondary | ICD-10-CM | POA: Insufficient documentation

## 2020-08-21 DIAGNOSIS — R0600 Dyspnea, unspecified: Secondary | ICD-10-CM | POA: Diagnosis not present

## 2020-08-21 DIAGNOSIS — R21 Rash and other nonspecific skin eruption: Secondary | ICD-10-CM | POA: Diagnosis not present

## 2020-08-21 DIAGNOSIS — Z79899 Other long term (current) drug therapy: Secondary | ICD-10-CM | POA: Diagnosis not present

## 2020-08-21 DIAGNOSIS — R0789 Other chest pain: Secondary | ICD-10-CM | POA: Diagnosis not present

## 2020-08-21 DIAGNOSIS — I5022 Chronic systolic (congestive) heart failure: Secondary | ICD-10-CM

## 2020-08-21 DIAGNOSIS — R079 Chest pain, unspecified: Secondary | ICD-10-CM | POA: Diagnosis not present

## 2020-08-21 LAB — CBC
HCT: 35.2 % — ABNORMAL LOW (ref 36.0–46.0)
Hemoglobin: 10.5 g/dL — ABNORMAL LOW (ref 12.0–15.0)
MCH: 30.6 pg (ref 26.0–34.0)
MCHC: 29.8 g/dL — ABNORMAL LOW (ref 30.0–36.0)
MCV: 102.6 fL — ABNORMAL HIGH (ref 80.0–100.0)
Platelets: 177 10*3/uL (ref 150–400)
RBC: 3.43 MIL/uL — ABNORMAL LOW (ref 3.87–5.11)
RDW: 16.1 % — ABNORMAL HIGH (ref 11.5–15.5)
WBC: 9.1 10*3/uL (ref 4.0–10.5)
nRBC: 0.6 % — ABNORMAL HIGH (ref 0.0–0.2)

## 2020-08-21 LAB — PROTIME-INR
INR: 2.6 — ABNORMAL HIGH (ref 0.8–1.2)
Prothrombin Time: 26.6 seconds — ABNORMAL HIGH (ref 11.4–15.2)

## 2020-08-21 LAB — RESP PANEL BY RT-PCR (FLU A&B, COVID) ARPGX2
Influenza A by PCR: NEGATIVE
Influenza B by PCR: NEGATIVE
SARS Coronavirus 2 by RT PCR: NEGATIVE

## 2020-08-21 LAB — BASIC METABOLIC PANEL
Anion gap: 10 (ref 5–15)
BUN: 17 mg/dL (ref 8–23)
CO2: 22 mmol/L (ref 22–32)
Calcium: 11.2 mg/dL — ABNORMAL HIGH (ref 8.9–10.3)
Chloride: 104 mmol/L (ref 98–111)
Creatinine, Ser: 1.32 mg/dL — ABNORMAL HIGH (ref 0.44–1.00)
GFR, Estimated: 40 mL/min — ABNORMAL LOW (ref >60)
Glucose, Bld: 102 mg/dL — ABNORMAL HIGH (ref 70–99)
Potassium: 5.7 mmol/L — ABNORMAL HIGH (ref 3.5–5.1)
Sodium: 136 mmol/L (ref 135–145)

## 2020-08-21 LAB — LACTIC ACID, PLASMA: Lactic Acid, Venous: 1.4 mmol/L (ref 0.5–1.9)

## 2020-08-21 LAB — TROPONIN I (HIGH SENSITIVITY): Troponin I (High Sensitivity): 13 ng/L (ref <18)

## 2020-08-21 LAB — LACTATE DEHYDROGENASE: LDH: 233 U/L — ABNORMAL HIGH (ref 98–192)

## 2020-08-21 MED ORDER — SODIUM CHLORIDE 0.9 % IV BOLUS
500.0000 mL | Freq: Once | INTRAVENOUS | Status: AC
  Administered 2020-08-21: 17:00:00 500 mL via INTRAVENOUS

## 2020-08-21 MED ORDER — SODIUM ZIRCONIUM CYCLOSILICATE 5 G PO PACK
5.0000 g | PACK | Freq: Once | ORAL | Status: DC
  Filled 2020-08-21: qty 1

## 2020-08-21 MED ORDER — ACETAMINOPHEN 325 MG PO TABS
650.0000 mg | ORAL_TABLET | Freq: Once | ORAL | Status: AC
  Administered 2020-08-21: 16:00:00 650 mg via ORAL
  Filled 2020-08-21: qty 2

## 2020-08-21 NOTE — Consult Note (Addendum)
Advanced Heart Failure VAD Consult Note   PCP-Cardiologist: [name]   Reason for Consult: dyspnea   HPI:    Brianna Hanson is a being seen for evaluation of dyspnea, at request of Dr. Regenia Skeeter, Emergency Medicine.    85 y.o. 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 [name] (follwoed q 6 months). Underwent implantation of the HeartMate II LVAD on 04/06/13 for DT. She is not on aspirin due to dizziness. S/P lap cholecystitis 10/2015.   GI Issues 09/2016 LGIB. Angio- no source of bleeding. LVAD speed turned down to 8400  LVAD Speed Change 12/2016 Ramp ECHO -->Speed increased from 8400>8800  Significant Events on VAD Support:  09/21/16 GIB, Ramp echo speed decreased to 8400  01/08/17 - LOW FLOW alarms; speed increased to 8800 RPM 05/20/17>speed decreased to 8600 06/18/19 - DC-CV of AF   Had recurrent AF in 9/20. Amio increased to 200 bid. Did not tolerate well with increased low flows on VAD. Underwent successful DC-CV on 06/18/19.  Admitted 4/21 and underwent multi-level kyphoplasty.   She presents now to ED w/ CC of atypical chest pain and dyspnea. Initially went to urgent care but was referred to ED given chest pain. Reports 4 day h/o sharp left sided CP radiating to back and exertional dyspnea. CP rated 8/10. Also w/ cough w/ yellow colored sputum. No subjective fever, chills nor body aches. No sick contacts. Has received covid vaccine.   In ED, O2 sats 96% on RA. Afebrile. COVID negative. CXR shows bibasilar atelectasis/scarring. No focal consolidation, pleural effusion or pneumothorax. Stable cardiomegaly. BP 88/69 (76). VAD parameters stable. WBC WNL at 9.1. Hgb 10.5. SCr 1.32. K 5.7 (lokelma given). Hs trop 13. Lactic acid 1.4.   She is starting to feel better in ED. No resting dyspnea.       LVAD INTERROGATION:  HeartMate II LVAD:  Flow 4.1 liters/min, speed 8600, power 4.3, PI  6.5. 10-20 daily PI events      Review of Systems: [y] = yes, [ ]  = no   General: Weight gain [ ] ; Weight loss [ ] ; Anorexia [ ] ; Fatigue [ ] ; Fever [ ] ; Chills [ ] ; Weakness [ ]   Cardiac: Chest pain/pressure [ Y]; Resting SOB [ ] ; Exertional SOB [Y ]; Orthopnea [ ] ; Pedal Edema [ ] ; Palpitations [ ] ; Syncope [ ] ; Presyncope [ ] ; Paroxysmal nocturnal dyspnea[ ]   Pulmonary: Cough Jazmín.Cullens ]; Wheezing[ ] ; Hemoptysis[ ] ; Sputum [Y ]; Snoring [ ]   GI: Vomiting[ ] ; Dysphagia[ ] ; Melena[ ] ; Hematochezia [ ] ; Heartburn[ ] ; Abdominal pain [ ] ; Constipation [ ] ; Diarrhea [ ] ; BRBPR [ ]   GU: Hematuria[ ] ; Dysuria [ ] ; Nocturia[ ]   Vascular: Pain in legs with walking [ ] ; Pain in feet with lying flat [ ] ; Non-healing sores [ ] ; Stroke [ ] ; TIA [ ] ; Slurred speech [ ] ;  Neuro: Headaches[ ] ; Vertigo[ ] ; Seizures[ ] ; Paresthesias[ ] ;Blurred vision [ ] ; Diplopia [ ] ; Vision changes [ ]   Ortho/Skin: Arthritis [ ] ; Joint pain [ ] ; Muscle pain [ ] ; Joint swelling [ ] ; Back Pain [ ] ; Rash [ ]   Psych: Depression[ ] ; Anxiety[ ]   Heme: Bleeding problems [ ] ; Clotting disorders [ ] ; Anemia [ ]   Endocrine: Diabetes [ ] ; Thyroid dysfunction[ ]     Home Medications Prior to Admission medications   Medication Sig Start Date End Date Taking? Authorizing Provider  amiodarone (PACERONE) 200 MG tablet TAKE 1 TABLET BY MOUTH EVERY DAY 03/29/20   Natlie Asfour, Shaune Pascal, MD  calcitonin, salmon, (MIACALCIN/FORTICAL) 200 UNIT/ACT nasal spray Place 1 spray into alternate nostrils daily. 02/25/20   Chairty Toman, Shaune Pascal, MD  carvedilol (COREG) 6.25 MG tablet TAKE 1 TABLET BY MOUTH 2 TIMES DAILY WITH A MEAL 07/10/20   Rashaunda Rahl, Shaune Pascal, MD  Cholecalciferol (VITAMIN D3) 25 MCG (1000 UT) CAPS Take 1,000 Units by mouth daily.     [provider]  docusate sodium (COLACE) 100 MG capsule Take 100 mg by mouth daily.    [provider]  ferrous sulfate 325 (65 FE) MG tablet Take 1 tablet (325 mg total) by mouth 2 (two) times daily  with a meal. 07/11/15   Jaray Boliver, Shaune Pascal, MD  gabapentin (NEURONTIN) 100 MG capsule Take 3 capsules (300 mg total) by mouth 2 (two) times daily. Patient taking differently: Take 100 mg by mouth 2 (two) times daily.  10/25/19   Monzerat Handler, Shaune Pascal, MD  hydrALAZINE (APRESOLINE) 100 MG tablet Take 0.5 tablets (50 mg total) by mouth 3 (three) times daily. 01/07/20   Clegg, Amy D, NP  losartan (COZAAR) 50 MG tablet Take 1 tablet (50 mg total) by mouth daily. 04/13/20   Duru Reiger, Shaune Pascal, MD  magnesium oxide (MAG-OX) 400 (241.3 Mg) MG tablet Take 0.5 tablets (200 mg total) by mouth 2 (two) times daily. 02/17/20   Emilliano Dilworth, Shaune Pascal, MD  ondansetron (ZOFRAN) 4 MG tablet Take 1 tablet (4 mg total) by mouth every 8 (eight) hours as needed for nausea or vomiting. Patient not taking: Reported on 06/08/2020 12/22/19   Johnattan Strassman, Shaune Pascal, MD  pantoprazole (PROTONIX) 40 MG tablet TAKE 1 TABLET BY MOUTH EVERY DAY Patient not taking: Reported on 08/09/2020 06/26/20   Rhet Rorke, Shaune Pascal, MD  potassium chloride SA (KLOR-CON M20) 20 MEQ tablet Take 1 tablet (20 mEq total) by mouth 2 (two) times daily. 08/16/19   Treavon Castilleja, Shaune Pascal, MD  promethazine (PHENERGAN) 12.5 MG tablet Take 1 tablet (12.5 mg total) by mouth every 6 (six) hours as needed for nausea or vomiting. Patient not taking: Reported on 06/08/2020 12/22/19   Azyria Osmon, Shaune Pascal, MD  traMADol (ULTRAM) 50 MG tablet Take 2 tablets (100 mg total) by mouth every 12 (twelve) hours as needed for moderate pain. 06/08/20   Kimoni Pickerill, Shaune Pascal, MD  warfarin (COUMADIN) 2.5 MG tablet Take 5 mg (2 tabs) every Monday and 2.5 mg (1 tab) all other days or as directed by HF Clinic 03/14/20   Orma Render, RPH-CPP    Past Medical History: Past Medical History:  Diagnosis Date  . AICD (automatic cardioverter/defibrillator) present   . Anemia   . Atrial fibrillation or flutter   . Cardiac arrest - ventricular fibrillation 12/10   with successful resucitation, S/p ICD  .  CHF (congestive heart failure) ([place])   . Diverticula of colon 2011  . HTN (hypertension)    moderate  . ICD (implantable cardiac defibrillator) in place    she has received appropriate therapy for VF  . Internal and external hemorrhoids without complication 7672  . Nonischemic cardiomyopathy ([place])    followed by Dr April Holding at [facility]  . Osteopenia   . Plasma cell disorder 03/20/2012  . Plasma cell disorder 03/20/2012  . Sessile colonic polyp 2011   Dr Benson Norway    Past Surgical History: Past Surgical History:  Procedure Laterality Date  . ABDOMINAL HYSTERECTOMY    . CARDIAC CATHETERIZATION    .  CARDIAC DEFIBRILLATOR PLACEMENT     by Greggory Brandy for secondary prevention of sudden death  . CARDIOVERSION N/A 2019/06/27   Procedure: CARDIOVERSION;  Surgeon: Jolaine Artist, MD;  Location: [facility] ENDOSCOPY;  Service: Cardiovascular;  Laterality: N/A;  . CHOLECYSTECTOMY N/A 10/09/2015   Procedure: LAPAROSCOPIC CHOLECYSTECTOMY;  Surgeon: Rolm Bookbinder, MD;  Location: [place];  Service: General;  Laterality: N/A;  . COLONOSCOPY W/ POLYPECTOMY  2011   Dr Benson Norway  . INSERTION OF IMPLANTABLE LEFT VENTRICULAR ASSIST DEVICE N/A 04/06/2013   Procedure: INSERTION OF IMPLANTABLE LEFT VENTRICULAR ASSIST DEVICE;  Surgeon: Gaye Pollack, MD;  Location: [place];  Service: Open Heart Surgery;  Laterality: N/A;  . INTRAOPERATIVE TRANSESOPHAGEAL ECHOCARDIOGRAM N/A 04/06/2013   Procedure: INTRAOPERATIVE TRANSESOPHAGEAL ECHOCARDIOGRAM;  Surgeon: Gaye Pollack, MD;  Location: [place] OR;  Service: Open Heart Surgery;  Laterality: N/A;  . IR GENERIC HISTORICAL  09/24/2016   IR US GUIDE VASC ACCESS RIGHT 09/24/2016 Greggory Keen, MD [facility]  . IR GENERIC HISTORICAL  09/24/2016   IR ANGIOGRAM VISCERAL SELECTIVE 09/24/2016 Greggory Keen, MD [facility]  . IR GENERIC HISTORICAL  09/24/2016   IR ANGIOGRAM VISCERAL SELECTIVE 09/24/2016 Greggory Keen, MD [facility]  . IR VERTEBROPLASTY EA ADDL (T&LS) BX INC UNI/BIL INC INJECT/IMAGING   12/29/2019  . IR VERTEBROPLASTY EA ADDL (T&LS) BX INC UNI/BIL INC INJECT/IMAGING  12/29/2019  . IR VERTEBROPLASTY EA ADDL (T&LS) BX INC UNI/BIL INC INJECT/IMAGING  12/29/2019  . IR VERTEBROPLASTY LUMBAR BX INC UNI/BIL INC/INJECT/IMAGING  12/29/2019  . PLACEMENT OF CENTRIMAG VENTRICULAR ASSIST DEVICE Right 04/06/2013   Procedure: PLACEMENT OF CENTRIMAG VENTRICULAR ASSIST DEVICE;  Surgeon: Gaye Pollack, MD;  Location: [place];  Service: Open Heart Surgery;  Laterality: Right;  . RADIOLOGY WITH ANESTHESIA N/A 12/29/2019   Procedure: KYPHOPLASTY;  Surgeon: Luanne Bras, MD;  Location: [place];  Service: Radiology;  Laterality: N/A;    Family History: Family History  Problem Relation Age of Onset  . Stroke Mother   . Stroke Brother     Social History: Social History   Socioeconomic History  . Marital status: Soil scientist    Spouse name: Not on file  . Number of children: 3  . Years of education: Not on file  . Highest education level: Not on file  Occupational History  . Not on file  Tobacco Use  . Smoking status: Never Smoker  . Smokeless tobacco: Never Used  . Tobacco comment: remote history of tobacco abuse  Vaping Use  . Vaping Use: Never used  Substance and Sexual Activity  . Alcohol use: Yes    Comment: occsionally wine  . Drug use: No  . Sexual activity: Not Currently    Birth control/protection: Surgical  Other Topics Concern  . Not on file  Social History Narrative   Lives with spouse who was recently diagnosed with esophageal ca and is receiving chemotherapy   Social Determinants of Health   Financial Resource Strain: Not on file  Food Insecurity: Not on file  Transportation Needs: Not on file  Physical Activity: Not on file  Stress: Not on file  Social Connections: Not on file    Allergies:  Allergies  Allergen Reactions  . Oxycodone Other (See Comments)    Hallucinations     Objective:    Vital Signs:   Temp:  [98.5 F (36.9 C)] 98.5 F (36.9  C) (12/20 1439) Pulse Rate:  [92] 92 (12/20 1439) Resp:  [15] 15 (12/20 1439) BP: (91)/(72) 91/72 (12/20 1439)  SpO2:  [94 %] 94 % (12/20 1439)   There were no vitals filed for this visit.  Mean arterial Pressure 76  Physical Exam    General:  Well appearing. No resp difficulty HEENT: Normal Neck: supple. JVP . Carotids 2+ bilat; no bruits. No lymphadenopathy or thyromegaly appreciated. Cor: Mechanical heart sounds with LVAD hum present. Lungs: faint left basilar crackle  Abdomen: soft, nontender, nondistended. No hepatosplenomegaly. No bruits or masses. Good bowel sounds. Driveline: C/D/I; securement device intact and driveline incorporated Extremities: no cyanosis, clubbing, rash, edema Neuro: alert & orientedx3, cranial nerves grossly intact. moves all 4 extremities w/o difficulty. Affect pleasant   Telemetry   NSR w/ occasional PVCs 70s   EKG   Poor quality tracing (LVAD), underlying rhythm sinus, 78 bpm. No ST abnormalities   Labs    Basic Metabolic Panel: No results for input(s): NA, K, CL, CO2, GLUCOSE, BUN, CREATININE, CALCIUM, MG, PHOS in the last 168 hours.  Liver Function Tests: No results for input(s): AST, ALT, ALKPHOS, BILITOT, PROT, ALBUMIN in the last 168 hours. No results for input(s): LIPASE, AMYLASE in the last 168 hours. No results for input(s): AMMONIA in the last 168 hours.  CBC: No results for input(s): WBC, NEUTROABS, HGB, HCT, MCV, PLT in the last 168 hours.  Cardiac Enzymes: No results for input(s): CKTOTAL, CKMB, CKMBINDEX, TROPONINI in the last 168 hours.  BNP: BNP (last 3 results) No results for input(s): BNP in the last 8760 hours.  ProBNP (last 3 results) No results for input(s): PROBNP in the last 8760 hours.   CBG: No results for input(s): GLUCAP in the last 168 hours.  Coagulation Studies: No results for input(s): LABPROT, INR in the last 72 hours.  Other results: EKG: Poor quality tracing (LVAD), underlying rhythm sinus,  78 bpm. No ST abnormalities     Imaging    DG Chest Portable 1 View  Result Date: 08/21/2020 CLINICAL DATA:  85 year old female with chest pain. EXAM: PORTABLE CHEST 1 VIEW COMPARISON:  Chest radiograph dated 12/22/2019. FINDINGS: There are bibasilar atelectasis/scarring. No focal consolidation, pleural effusion or pneumothorax. Stable cardiomegaly. Median sternotomy wires and left pectoral AICD device. Partially visualized LVAD. Atherosclerotic calcification of the aorta. No acute osseous pathology. IMPRESSION: No active disease.  No interval change. Electronically Signed   By: Anner Crete M.D.   On: 08/21/2020 15:19      Assessment/Plan:    1. Chest Pain  - atypical CP. EKG w/ no acute ST abnormalities -  Hs trop negative at 13 - suspect related to URI (see w/u below)   2. Dyspnea/ URI  - O2 sats 96% on RA - CXR w/o infiltrate, no edema. Lung exam unremarkable. No peripheral edema on exam  - COVID negative - HS trop  Negative - suspect 2/2 URI, given associated productive cough. Now feeling better in ED. No indication for admission. Will allow pt to go home. Will hold off on treatment for now. Suspect URI is viral in nature. However if productive cough worsens over the next 1-2 days, pt notified to contact [facility] and we will plan to treat w/ outpatient abx  3. Chronic systolic HF: NICM, s/p ICD and LVAD for DT(04/2013).  - now > 7 years out on VAD support - Volume status ok. CXR w/o edema. No peripheral edema on exam  - continue home regimen   4.  LVAD placed for DT 04/2013:  - Failed Entresto due to diuresis and incessant low flow alarms.  - Continue losartan  100 mg daily.  - VAD interrogated personally. Parameters stable. - No ASA with GI bleed and intolerance.  - Continue coumadin. No recent bleeding. INR pending  - INR Goal 1.8-2.3   5. PAF:  - Did not tolerate AF well at all.  - S/p DC-CV 06/18/19 - Remains on amiodarone 200 daily. - NSR on EKG today   -  Continue warfarin  6.  HTN:  - MAPs stable in mid 66s - Continue home regimen   7. H/o BRBPR - suspect diverticulosis. Had episode in 09/2016.  - 12/2009 Colonoscopy, Dr Benson Norway: descending adenoma and pan-diverticulosis >in sigmoid, int/ext hemorrhoids.  - Has seen GI. We have decided not to pursue repeat colonoscopy at this time given age and comorbidities If bleeding persists can revisit Hgb 10.5 c/w baseline  8. Hyperkalemia - K 5.7 in ED - received Lokelma  - will arrange lab f/u in [facility] later this week     I reviewed the LVAD parameters from today, and compared the results to the patient's prior recorded data.  No programming changes were made.  The LVAD is functioning within specified parameters.  The patient performs LVAD self-test daily.  LVAD interrogation was negative for any significant power changes, alarms or PI events/speed drops.  LVAD equipment check completed and is in good working order.  Back-up equipment present.   LVAD education done on emergency procedures and precautions and reviewed exit site care.  Length of Stay: 0  Nelida Gores 08/21/2020, 3:22 PM  VAD Team Pager [phone] (7am - 7am) +++VAD ISSUES ONLY+++   Advanced Heart Failure Team Pager [phone] (M-F; 7a - 4p)  Please contact [facility] for night-coverage after hours (4p -7a ) and weekends on [email or web address] for all non- LVAD Issues  Patient seen and examined with the above-signed Advanced Practice Provider and/or Housestaff. I personally reviewed laboratory data, imaging studies and relevant notes. I independently examined the patient and formulated the important aspects of the plan. I have edited the note to reflect any of my changes or salient points. I have personally discussed the plan with the patient and/or family.  85 y/o woman with severe HF due to NICM now s/p VAD. Presents to ER with productive cough, CP and exertional dyspnea. No fevers or chills. CXR clear. WBC  normal  General:  NAD.  HEENT: normal  Neck: supple. JVP not elevated.  Carotids 2+ bilat; no bruits. No lymphadenopathy or thryomegaly appreciated. Cor: LVAD hum.  Lungs: Clear. Abdomen: obese soft, nontender, non-distended. No hepatosplenomegaly. No bruits or masses. Good bowel sounds. Driveline site clean. Anchor in place.  Extremities: no cyanosis, clubbing, rash. Warm no edema  Neuro: alert & oriented x 3. No focal deficits. Moves all 4 without problem    Suspect she has viral URI. Seems to be improving. No evidence of PN on CXR. I think she is safe to d/c home with close f/u in [facility]. VAD interrogated personally. Parameters stable. Labs are hemolyzed. Can repeat in clinic. If symptoms persist we can start abx as outpatient. D/w Dr. Regenia Skeeter.   Glori Bickers, MD  6:42 PM

## 2020-08-21 NOTE — ED Notes (Signed)
Patient is being discharged from the Urgent Care and sent to the Emergency Department via POV . Per Dr. Lanny Cramp, patient is in need of higher level of care due to Chest Pain. Patient is aware and verbalizes understanding of plan of care.  Vitals:   08/21/20 1251  BP: (!) 107/58  Pulse: 98  Resp: 16  Temp: 98.6 F (37 C)  SpO2: 93%

## 2020-08-21 NOTE — ED Triage Notes (Signed)
Pt is c/o chest pain, coughing and tightness, sob X 4 days. Pt denies having other sxs. Pt states cardio vascular center sent her here to get tested.

## 2020-08-21 NOTE — Progress Notes (Signed)
LVAD Coordinator ED Encounter  Brianna Hanson a 85 y.o. female that presented to [facility] ER today due to left side chest pain, intermittent shortness of breath, and productive cough that started 3-4 days ago. She has a past medical history  has a past medical history of AICD (automatic cardioverter/defibrillator) present, Anemia, Atrial fibrillation or flutter, Cardiac arrest - ventricular fibrillation (12/10), CHF (congestive heart failure) ([place]), Diverticula of colon (2011), HTN (hypertension), ICD (implantable cardiac defibrillator) in place, Internal and external hemorrhoids without complication (5465), Nonischemic cardiomyopathy ([place]), Osteopenia, Plasma cell disorder (03/20/2012), Plasma cell disorder (03/20/2012), and Sessile colonic polyp (2011).  LVAD is a HM II and was implanted on 04/06/13 by Dr Cyndia Bent.   Received call from patient and he husband this morning reporting cough with shortness of breath that started 3-4 days ago. Instructed her at that time to go to urgent care for COVID/flu testing. Upon arrival to urgent care, she was having left sided chest pain (this has been intermittent since onset of symptoms) and the urgent care sent her to the ER for evaluation.   Currently reports left-side chest pain is sharp 8/10, and radiates from mid-sternal area over towards her pump. She rates this an 8/10; worse with palpation of sternum. Denies shortness of breath currently, but reports with any activity she feels short of breath. She states she feels best "when I lie flat." Vitals as below. BP slightly lower than her norm, but she denies lightheadedness, dizziness, or falls.   Discussed plan of care with Dr Regenia Skeeter. Will complete xray and lab work. Made aware that Dr Haroldine Laws will be her admitting MD if admission is required. Brittainy PA aware of patient in ER, and updated on her status. Instructed to call VAD pager if VAD needs arise.    Vital signs: HR: 90 SR Doppler MAP:  Automated BP: 88/69  (76) O2 Sat: 96% on RA  LVAD interrogation reveals:  Speed: 8600 Flow: 4.1 Power:  4.3 w PI: 6.5  Alarms: none Events: 10-20 PI events daily 12/2- 60+ PI events  Drive Line: Dressing clean, dry, intact. Dressing change completed weekly by her husband [name].   Significant Events with LVAD: 09/21/16 GIB, Ramp echo speed decreased to 8400  01/08/17 - LOW FLOW alarms; speed increased to 8800 RPM 05/20/17>speed decreased to 8600   Updated VAD Providers about the above. No LVAD issues and pump is functioning as expected. Able to independently manage LVAD equipment. No LVAD needs at this time.    Emerson Monte RN [place] Coordinator  Office: [phone]  24/7 Pager: [phone]

## 2020-08-21 NOTE — ED Provider Notes (Signed)
[facility] EMERGENCY DEPARTMENT Provider Note   CSN: [id number] Arrival date & time: 08/21/20  1427     History Chief Complaint  Patient presents with  . Chest Pain    Brianna Hanson is a 85 y.o. female.  HPI 85 year old female presents with chest pain and cough.  Patient has been feeling poorly since 1217 with the symptoms.  It is a nonproductive cough.  The chest pain is best when she is laying flat.  Otherwise any type of movement or walking makes it worse.  It is a sharp pain in her left chest that goes across her chest.  No vomiting, fever.  She does feel short of breath when she walks.  No leg swelling.  She has an LVAD but there have been no obvious complications of this. No dizziness.   Past Medical History:  Diagnosis Date  . AICD (automatic cardioverter/defibrillator) present   . Anemia   . Atrial fibrillation or flutter   . Cardiac arrest - ventricular fibrillation 12/10   with successful resucitation, S/p ICD  . CHF (congestive heart failure) ([place])   . Diverticula of colon 2011  . HTN (hypertension)    moderate  . ICD (implantable cardiac defibrillator) in place    she has received appropriate therapy for VF  . Internal and external hemorrhoids without complication 5366  . Nonischemic cardiomyopathy ([place])    follow[facility] Dr April [facility]  . Osteopenia   . Plasma cell disorder 03/20/2012  . Plasma cell disorder 03/20/2012  . Sessile colonic polyp 2011   Dr Benson Norway    Patient Active Problem List   Diagnosis Date Noted  . Malnutrition of moderate degree 12/25/2019  . Nausea and vomiting 12/22/2019  . Intractable nausea and vomiting 12/22/2019  . Closed compression fracture of L3 lumbar vertebra, initial encounter ([place]) 11/24/2019  . Primary osteoarthritis of both hips 09/07/2019  . Acute hip pain, bilateral 09/06/2019  . Osteoporosis with pathological fracture 09/06/2019  . Lumbar compression fracture ([place]) 08/13/2019  . Acute left-sided  low back pain with left-sided sciatica 08/04/2019  . Closed wedge compression fracture of first lumbar vertebra ([place]) 08/04/2019  . Hyperlipidemia LDL goal <130 03/04/2018  . Eczema of lower extremity 03/04/2018  . Diverticulosis of colon with hemorrhage   . Primary osteoarthritis of left knee 09/05/2016  . Iron deficiency anemia 07/11/2015  . Chronic anticoagulation 11/24/2013  . Chronic systolic congestive heart failure ([place]) 05/01/2013  . Abdominal pain 04/18/2013  . LVAD (left ventricular assist device) present ([place]) 04/07/2013  . Right heart failure ([place]) 03/29/2013  . PAH (pulmonary artery hypertension) ([place]) 03/29/2013  . Plasma cell disorder 03/20/2012  . Long term current use of anticoagulant therapy 02/14/2012  . Other abnormal glucose 02/03/2012  . Chronic renal insufficiency, stage II (mild) 02/03/2012  . Atrial fibrillation ([place]) 11/16/2010  . Essential hypertension, benign 05/13/2007  . CARDIOMYOPATHY, PRIMARY NEC 05/13/2007  . OSTEOPENIA 05/13/2007    Past Surgical History:  Procedure Laterality Date  . ABDOMINAL HYSTERECTOMY    . CARDIAC CATHETERIZATION    . CARDIAC DEFIBRILLATOR PLACEMENT     by Greggory Brandy for secondary prevention of sudden death  . CARDIOVERSION N/A Jul 16, 2019   Procedure: CARDIOVERSION;  Surgeon: Jolaine Artist, MD;  Location: [facility] ENDOSCOPY;  Service: Cardiovascular;  Laterality: N/A;  . CHOLECYSTECTOMY N/A 10/09/2015   Procedure: LAPAROSCOPIC CHOLECYSTECTOMY;  Surgeon: Rolm Bookbinder, MD;  Location: [place] [facility];  Service: General;  Laterality: N/A;  . COLONOSCOPY W/ POLYPECTOMY  2011  Dr Benson Norway  . INSERTION OF IMPLANTABLE LEFT VENTRICULAR ASSIST DEVICE N/A 04/06/2013   Procedure: INSERTION OF IMPLANTABLE LEFT VENTRICULAR ASSIST DEVICE;  Surgeon: Gaye Pollack, MD;  Location: [facility];  Service: Open Heart Surgery;  Laterality: N/A;  . INTRAOPERATIVE TRANSESOPHAGEAL ECHOCARDIOGRAM N/A 04/06/2013   Procedure: INTRAOPERATIVE TRANSESOPHAGEAL ECHOCARDIOGRAM;   Surgeon: Gaye Pollack, MD;  Location: [facility] OR;  Service: Open Heart Surgery;  Laterality: N/A;  . IR GENERIC HISTORICAL  09/24/2016   IR US GUIDE VASC ACCESS RIGHT 09/24/2016 Greggory Keen, MD [facility]INTER[facility]  . IR GENERIC HISTORICAL  09/24/2016   IR ANGIOGRAM VISCERAL SELECTIVE 09/24/2016 Greggory Keen, MD [facility]  . IR GENERIC HISTORICAL  09/24/2016   IR ANGIOGRAM VISCERAL SELECTIVE 09/24/2016 Greggory Keen, MD [facility]  . IR VERTEBROPLASTY EA ADDL (T&LS) BX INC UNI/BIL INC INJECT/IMAGING  12/29/2019  . IR VERTEBROPLASTY EA ADDL (T&LS) BX INC UNI/BIL INC INJECT/IMAGING  12/29/2019  . IR VERTEBROPLASTY EA ADDL (T&LS) BX INC UNI/BIL INC INJECT/IMAGING  12/29/2019  . IR VERTEBROPLASTY LUMBAR BX INC UNI/BIL INC/INJECT/IMAGING  12/29/2019  . PLACEMENT OF CENTRIMAG VENTRICULAR ASSIST DEVICE Right 04/06/2013   Procedure: PLACEMENT OF CENTRIMAG VENTRICULAR ASSIST DEVICE;  Surgeon: Gaye Pollack, MD;  Location: [facility];  Service: Open Heart Surgery;  Laterality: Right;  . RADIOLOGY WITH ANESTHESIA N/A 12/29/2019   Procedure: KYPHOPLASTY;  Surgeon: Luanne Bras, MD;  Location: [facility];  Service: Radiology;  Laterality: N/A;     OB History   No obstetric history on file.     Family History  Problem Relation Age of Onset  . Stroke Mother   . Stroke Brother     Social History   Tobacco Use  . Smoking status: Never Smoker  . Smokeless tobacco: Never Used  . Tobacco comment: remote history of tobacco abuse  Vaping Use  . Vaping Use: Never used  Substance Use Topics  . Alcohol use: Yes    Comment: occsionally wine  . Drug use: No    Home Medications Prior to Admission medications   Medication Sig Start Date End Date Taking? Authorizing Provider  amiodarone (PACERONE) 200 MG tablet TAKE 1 TABLET BY MOUTH EVERY DAY Patient taking differently: Take 200 mg by mouth daily. 03/29/20  Yes [name], Shaune Pascal, MD  calcitonin, salmon, (MIACALCIN/FORTICAL) 200 UNIT/ACT nasal spray Place 1 spray  into alternate nostrils daily. 02/25/20  Yes [name], Shaune Pascal, MD  carvedilol (COREG) 6.25 MG tablet TAKE 1 TABLET BY MOUTH 2 TIMES DAILY WITH A MEAL Patient taking differently: Take 6.25 mg by mouth 2 (two) times daily with a meal. 07/10/20  Yes [name], Shaune Pascal, MD  Cholecalciferol (VITAMIN D3) 25 MCG (1000 UT) CAPS Take 1,000 Units by mouth daily.    Yes [provider]  docusate sodium (COLACE) 100 MG capsule Take 100 mg by mouth daily.   Yes [provider]  ferrous sulfate 325 (65 FE) MG tablet Take 1 tablet (325 mg total) by mouth 2 (two) times daily with a meal. 07/11/15  Yes [name], Shaune Pascal, MD  furosemide (LASIX) 20 MG tablet Take 20 mg by mouth daily as needed for fluid or edema.   Yes [provider]  gabapentin (NEURONTIN) 100 MG capsule Take 3 capsules (300 mg total) by mouth 2 (two) times daily. Patient taking differently: Take 100 mg by mouth in the morning and at bedtime. 10/25/19  Yes [name], Shaune Pascal, MD  hydrALAZINE (APRESOLINE) 100 MG tablet Take 0.5 tablets (50 mg total) by  mouth 3 (three) times daily. 01/07/20  Yes Clegg, Amy D, NP  losartan (COZAAR) 50 MG tablet Take 1 tablet (50 mg total) by mouth daily. 04/13/20  Yes [name], Shaune Pascal, MD  magnesium oxide (MAG-OX) 400 (241.3 Mg) MG tablet Take 0.5 tablets (200 mg total) by mouth 2 (two) times daily. 02/17/20  Yes [name], Shaune Pascal, MD  pantoprazole (PROTONIX) 40 MG tablet TAKE 1 TABLET BY MOUTH EVERY DAY Patient taking differently: Take 40 mg by mouth daily as needed (for reflux). 06/26/20  Yes [name], Shaune Pascal, MD  potassium chloride SA (KLOR-CON M20) 20 MEQ tablet Take 1 tablet (20 mEq total) by mouth 2 (two) times daily. 08/16/19  Yes [name], Shaune Pascal, MD  traMADol (ULTRAM) 50 MG tablet Take 2 tablets (100 mg total) by mouth every 12 (twelve) hours as needed for moderate pain. 06/08/20  Yes [name], Shaune Pascal, MD  warfarin (COUMADIN) 2.5 MG tablet Take 5 mg (2 tabs) every Monday  and 2.5 mg (1 tab) all other days or as directed by HF Clinic Patient taking differently: Take 2.5-5 mg by mouth See admin instructions. Take 2.5 mg by mouth in the evening on Sun/Tues/Wed/Thurs/Fri/Sat and 5 mg on Mon 03/14/20  Yes Orma Render, RPH-CPP  ondansetron (ZOFRAN) 4 MG tablet Take 1 tablet (4 mg total) by mouth every 8 (eight) hours as needed for nausea or vomiting. Patient not taking: No sig reported 12/22/19   [name], Shaune Pascal, MD  promethazine (PHENERGAN) 12.5 MG tablet Take 1 tablet (12.5 mg total) by mouth every 6 (six) hours as needed for nausea or vomiting. Patient not taking: No sig reported 12/22/19   [name], Shaune Pascal, MD    Allergies    Oxycodone  Review of Systems   Review of Systems  Constitutional: Negative for fever.  Respiratory: Positive for cough and shortness of breath. Negative for wheezing.   Cardiovascular: Positive for chest pain. Negative for leg swelling.  Gastrointestinal: Negative for vomiting.  All other systems reviewed and are negative.   Physical Exam Updated Vital Signs BP 114/82   Pulse 74   Temp 98.5 F (36.9 C) (Oral)   Resp 19   SpO2 96%   Physical Exam Vitals and nursing note reviewed.  Constitutional:      General: She is not in acute distress.    Appearance: She is well-developed and well-nourished. She is not ill-appearing or diaphoretic.  HENT:     Head: Normocephalic and atraumatic.     Right Ear: External ear normal.     Left Ear: External ear normal.     Nose: Nose normal.  Eyes:     General:        Right eye: No discharge.        Left eye: No discharge.  Cardiovascular:     Rate and Rhythm: Normal rate and regular rhythm.     Heart sounds: Normal heart sounds.     Comments: Normal hum of LVAD Pulmonary:     Effort: Pulmonary effort is normal.     Breath sounds: Examination of the right-lower field reveals rales. Examination of the left-lower field reveals rales. Rales present.  Chest:     Chest wall:  Tenderness (over mid-chest. no rash) present.  Abdominal:     General: There is no distension.     Comments: Insertion site appear clean, no infection  Musculoskeletal:     Right lower leg: No edema.     Left lower leg: No edema.  Skin:  General: Skin is warm and dry.  Neurological:     Mental Status: She is alert.  Psychiatric:        Mood and Affect: Mood is not anxious.     ED Results / Procedures / Treatments   Labs (all labs ordered are listed, but only abnormal results are displayed) Labs Reviewed  BASIC METABOLIC PANEL - Abnormal; Notable for the following components:      Result Value   Potassium 5.7 (*)    Glucose, Bld 102 (*)    Creatinine, Ser 1.32 (*)    Calcium 11.2 (*)    GFR, Estimated 40 (*)    All other components within normal limits  CBC - Abnormal; Notable for the following components:   RBC 3.43 (*)    Hemoglobin 10.5 (*)    HCT 35.2 (*)    MCV 102.6 (*)    MCHC 29.8 (*)    RDW 16.1 (*)    nRBC 0.6 (*)    All other components within normal limits  PROTIME-INR - Abnormal; Notable for the following components:   Prothrombin Time 26.6 (*)    INR 2.6 (*)    All other components within normal limits  RESP PANEL BY RT-PCR (FLU A&B, COVID) [id number]  LACTIC ACID, PLASMA  LACTATE DEHYDROGENASE  TROPONIN I (HIGH SENSITIVITY)    EKG EKG Interpretation  Date/Time:  Monday August 21 2020 15:26:45 EST Ventricular Rate:  78 PR Interval:  240 QRS Duration: 91 QT Interval:  470 QTC Calculation: 536 R Axis:   -63 Text Interpretation: Sinus rhythm Short PR interval Left anterior fascicular block Minimal ST elevation, lateral leads Prolonged QT interval Interpretation limited secondary to artifact Please Discard, poor data quality Confirmed by Sherwood Gambler [phone]) on 08/21/2020 3:30:05 PM   Radiology DG Chest Portable 1 View  Result Date: 08/21/2020 CLINICAL DATA:  85 year old female with chest pain. EXAM: PORTABLE CHEST 1 VIEW COMPARISON:  Chest  radiograph dated 12/22/2019. FINDINGS: There are bibasilar atelectasis/scarring. No focal consolidation, pleural effusion or pneumothorax. Stable cardiomegaly. Median sternotomy wires and left pectoral AICD device. Partially visualized LVAD. Atherosclerotic calcification of the aorta. No acute osseous pathology. IMPRESSION: No active disease.  No interval change. Electronically Signed   By: Anner Crete M.D.   On: 08/21/2020 15:19    Procedures Procedures (including critical care time)  Medications Ordered in ED Medications  acetaminophen (TYLENOL) tablet 650 mg (650 mg Oral Given 08/21/20 1547)  sodium chloride 0.9 % bolus 500 mL (0 mLs Intravenous Stopped 08/21/20 1723)    ED Course  I have reviewed the triage vital signs and the nursing notes.  Pertinent labs & imaging results that were available during my care of the patient were reviewed by me and considered in my medical decision making (see chart for details).    MDM Rules/Calculators/A&P                          Patient had some soft blood pressures at first but this has improved.  She is not febrile.  Chest x-ray does not show any obvious lobar pneumonia.  Covid testing is negative.  Her labs are near baseline except for mild hyperkalemia and hypercalcemia.  Mild bump in creatinine.  I discussed these with [name], and given that the sample was hemolyzed and she is otherwise well-appearing, we will discharge her with no further work-up or treatment.  We had started some fluids before the lab updated that it  was actually hemolyzed.  We will stop this and encourage p.o. fluids at home and discharge to follow-up closely with cardiology.  Her chest pain seems musculoskeletal, likely reactive to the cough. Final Clinical Impression(s) / ED Diagnoses Final diagnoses:  Chest wall pain  Viral upper respiratory tract infection with cough    Rx / DC Orders ED Discharge Orders    None       Sherwood Gambler, MD 08/21/20  1733

## 2020-08-21 NOTE — Discharge Instructions (Signed)
If you develop recurrent, continued, or worsening chest pain, shortness of breath, fever, vomiting, abdominal or back pain, or any other new/concerning symptoms then return to the ER for evaluation.  

## 2020-08-21 NOTE — ED Triage Notes (Signed)
LVAD; C/O chest pain; sent by OP MD

## 2020-08-21 NOTE — ED Notes (Signed)
Pt states she is feeling a sharp pain on her chest.

## 2020-08-22 ENCOUNTER — Ambulatory Visit (HOSPITAL_COMMUNITY): Payer: Self-pay | Admitting: Pharmacist

## 2020-08-22 ENCOUNTER — Telehealth (HOSPITAL_COMMUNITY): Payer: Self-pay | Admitting: *Deleted

## 2020-08-22 DIAGNOSIS — J069 Acute upper respiratory infection, unspecified: Secondary | ICD-10-CM

## 2020-08-22 DIAGNOSIS — Z7901 Long term (current) use of anticoagulants: Secondary | ICD-10-CM | POA: Diagnosis not present

## 2020-08-22 LAB — POCT INR: INR: 2.4 (ref 2.0–3.0)

## 2020-08-22 MED ORDER — DOXYCYCLINE HYCLATE 50 MG PO CAPS: 100.0000 mg | ORAL_CAPSULE | Freq: Two times a day (BID) | ORAL | 0 refills | Status: AC

## 2020-08-22 NOTE — Progress Notes (Signed)
LVAD INR 

## 2020-08-22 NOTE — Telephone Encounter (Signed)
Pt called to report she is not feeling any better since hospital visit yesterday.   She denies fever/chills and says cough is "little better", but c/o increased SOB and chest/side pain. [name] updated. Rx for Doxy sent to local pharmacy.   Will cancel appt for tomorrow in VAD clinic, but patient needs labs next week. Appt made for same. Asked pt to call if symptoms do not improve or worsen. She verbalized understanding of same.   Zada Girt RN, [place] Coordinator [phone]

## 2020-08-23 ENCOUNTER — Encounter (HOSPITAL_COMMUNITY): Payer: Medicare Other

## 2020-08-24 ENCOUNTER — Other Ambulatory Visit (HOSPITAL_COMMUNITY): Payer: Self-pay | Admitting: Unknown Physician Specialty

## 2020-08-24 DIAGNOSIS — Z95811 Presence of heart assist device: Secondary | ICD-10-CM

## 2020-08-24 DIAGNOSIS — Z7901 Long term (current) use of anticoagulants: Secondary | ICD-10-CM

## 2020-08-25 ENCOUNTER — Other Ambulatory Visit: Payer: Self-pay

## 2020-08-25 ENCOUNTER — Encounter (HOSPITAL_COMMUNITY): Payer: Self-pay | Admitting: Emergency Medicine

## 2020-08-25 ENCOUNTER — Emergency Department (HOSPITAL_COMMUNITY): Payer: Medicare Other

## 2020-08-25 ENCOUNTER — Inpatient Hospital Stay (HOSPITAL_COMMUNITY)
Admission: EM | Admit: 2020-08-25 | Discharge: 2020-09-02 | DRG: 840 | Disposition: E | Payer: Medicare Other | Attending: Internal Medicine | Admitting: Internal Medicine

## 2020-08-25 DIAGNOSIS — R0602 Shortness of breath: Secondary | ICD-10-CM

## 2020-08-25 DIAGNOSIS — Z950 Presence of cardiac pacemaker: Secondary | ICD-10-CM | POA: Diagnosis not present

## 2020-08-25 DIAGNOSIS — G9341 Metabolic encephalopathy: Secondary | ICD-10-CM | POA: Diagnosis present

## 2020-08-25 DIAGNOSIS — S22079A Unspecified fracture of T9-T10 vertebra, initial encounter for closed fracture: Secondary | ICD-10-CM | POA: Diagnosis not present

## 2020-08-25 DIAGNOSIS — R4182 Altered mental status, unspecified: Secondary | ICD-10-CM | POA: Diagnosis not present

## 2020-08-25 DIAGNOSIS — Z66 Do not resuscitate: Secondary | ICD-10-CM | POA: Diagnosis present

## 2020-08-25 DIAGNOSIS — Z8674 Personal history of sudden cardiac arrest: Secondary | ICD-10-CM

## 2020-08-25 DIAGNOSIS — Z7401 Bed confinement status: Secondary | ICD-10-CM

## 2020-08-25 DIAGNOSIS — J811 Chronic pulmonary edema: Secondary | ICD-10-CM

## 2020-08-25 DIAGNOSIS — R000 Tachycardia, unspecified: Secondary | ICD-10-CM | POA: Diagnosis not present

## 2020-08-25 DIAGNOSIS — D61818 Other pancytopenia: Secondary | ICD-10-CM | POA: Diagnosis present

## 2020-08-25 DIAGNOSIS — Z7901 Long term (current) use of anticoagulants: Secondary | ICD-10-CM

## 2020-08-25 DIAGNOSIS — R918 Other nonspecific abnormal finding of lung field: Secondary | ICD-10-CM | POA: Diagnosis not present

## 2020-08-25 DIAGNOSIS — Z95811 Presence of heart assist device: Secondary | ICD-10-CM | POA: Diagnosis not present

## 2020-08-25 DIAGNOSIS — M160 Bilateral primary osteoarthritis of hip: Secondary | ICD-10-CM | POA: Diagnosis present

## 2020-08-25 DIAGNOSIS — E790 Hyperuricemia without signs of inflammatory arthritis and tophaceous disease: Secondary | ICD-10-CM | POA: Diagnosis not present

## 2020-08-25 DIAGNOSIS — E876 Hypokalemia: Secondary | ICD-10-CM | POA: Diagnosis present

## 2020-08-25 DIAGNOSIS — I5022 Chronic systolic (congestive) heart failure: Secondary | ICD-10-CM

## 2020-08-25 DIAGNOSIS — I130 Hypertensive heart and chronic kidney disease with heart failure and stage 1 through stage 4 chronic kidney disease, or unspecified chronic kidney disease: Secondary | ICD-10-CM | POA: Diagnosis present

## 2020-08-25 DIAGNOSIS — R570 Cardiogenic shock: Secondary | ICD-10-CM | POA: Diagnosis not present

## 2020-08-25 DIAGNOSIS — C9000 Multiple myeloma not having achieved remission: Secondary | ICD-10-CM | POA: Diagnosis not present

## 2020-08-25 DIAGNOSIS — Z515 Encounter for palliative care: Secondary | ICD-10-CM | POA: Diagnosis not present

## 2020-08-25 DIAGNOSIS — J9690 Respiratory failure, unspecified, unspecified whether with hypoxia or hypercapnia: Secondary | ICD-10-CM | POA: Diagnosis not present

## 2020-08-25 DIAGNOSIS — I472 Ventricular tachycardia: Secondary | ICD-10-CM | POA: Diagnosis not present

## 2020-08-25 DIAGNOSIS — Z885 Allergy status to narcotic agent status: Secondary | ICD-10-CM

## 2020-08-25 DIAGNOSIS — S22089A Unspecified fracture of T11-T12 vertebra, initial encounter for closed fracture: Secondary | ICD-10-CM | POA: Diagnosis not present

## 2020-08-25 DIAGNOSIS — J9601 Acute respiratory failure with hypoxia: Secondary | ICD-10-CM | POA: Diagnosis not present

## 2020-08-25 DIAGNOSIS — I428 Other cardiomyopathies: Secondary | ICD-10-CM | POA: Diagnosis present

## 2020-08-25 DIAGNOSIS — J9811 Atelectasis: Secondary | ICD-10-CM | POA: Diagnosis not present

## 2020-08-25 DIAGNOSIS — Z95810 Presence of automatic (implantable) cardiac defibrillator: Secondary | ICD-10-CM

## 2020-08-25 DIAGNOSIS — Z79899 Other long term (current) drug therapy: Secondary | ICD-10-CM

## 2020-08-25 DIAGNOSIS — Z0189 Encounter for other specified special examinations: Secondary | ICD-10-CM

## 2020-08-25 DIAGNOSIS — S32059A Unspecified fracture of fifth lumbar vertebra, initial encounter for closed fracture: Secondary | ICD-10-CM | POA: Diagnosis not present

## 2020-08-25 DIAGNOSIS — R0989 Other specified symptoms and signs involving the circulatory and respiratory systems: Secondary | ICD-10-CM

## 2020-08-25 DIAGNOSIS — N182 Chronic kidney disease, stage 2 (mild): Secondary | ICD-10-CM | POA: Diagnosis present

## 2020-08-25 DIAGNOSIS — I5021 Acute systolic (congestive) heart failure: Secondary | ICD-10-CM | POA: Diagnosis not present

## 2020-08-25 DIAGNOSIS — E8809 Other disorders of plasma-protein metabolism, not elsewhere classified: Secondary | ICD-10-CM | POA: Diagnosis present

## 2020-08-25 DIAGNOSIS — Z20822 Contact with and (suspected) exposure to covid-19: Secondary | ICD-10-CM | POA: Diagnosis present

## 2020-08-25 DIAGNOSIS — I2721 Secondary pulmonary arterial hypertension: Secondary | ICD-10-CM | POA: Diagnosis present

## 2020-08-25 DIAGNOSIS — Z79891 Long term (current) use of opiate analgesic: Secondary | ICD-10-CM

## 2020-08-25 DIAGNOSIS — Z452 Encounter for adjustment and management of vascular access device: Secondary | ICD-10-CM

## 2020-08-25 DIAGNOSIS — I5023 Acute on chronic systolic (congestive) heart failure: Secondary | ICD-10-CM | POA: Diagnosis not present

## 2020-08-25 DIAGNOSIS — Z90710 Acquired absence of both cervix and uterus: Secondary | ICD-10-CM

## 2020-08-25 DIAGNOSIS — Z6830 Body mass index (BMI) 30.0-30.9, adult: Secondary | ICD-10-CM

## 2020-08-25 DIAGNOSIS — J690 Pneumonitis due to inhalation of food and vomit: Secondary | ICD-10-CM | POA: Diagnosis not present

## 2020-08-25 DIAGNOSIS — Z4682 Encounter for fitting and adjustment of non-vascular catheter: Secondary | ICD-10-CM | POA: Diagnosis not present

## 2020-08-25 DIAGNOSIS — T17908A Unspecified foreign body in respiratory tract, part unspecified causing other injury, initial encounter: Secondary | ICD-10-CM

## 2020-08-25 DIAGNOSIS — I480 Paroxysmal atrial fibrillation: Secondary | ICD-10-CM | POA: Diagnosis present

## 2020-08-25 DIAGNOSIS — J90 Pleural effusion, not elsewhere classified: Secondary | ICD-10-CM | POA: Diagnosis not present

## 2020-08-25 DIAGNOSIS — R531 Weakness: Secondary | ICD-10-CM | POA: Diagnosis not present

## 2020-08-25 DIAGNOSIS — E785 Hyperlipidemia, unspecified: Secondary | ICD-10-CM | POA: Diagnosis present

## 2020-08-25 DIAGNOSIS — R791 Abnormal coagulation profile: Secondary | ICD-10-CM | POA: Diagnosis present

## 2020-08-25 DIAGNOSIS — E870 Hyperosmolality and hypernatremia: Secondary | ICD-10-CM | POA: Diagnosis present

## 2020-08-25 DIAGNOSIS — S32029A Unspecified fracture of second lumbar vertebra, initial encounter for closed fracture: Secondary | ICD-10-CM | POA: Diagnosis not present

## 2020-08-25 DIAGNOSIS — N179 Acute kidney failure, unspecified: Secondary | ICD-10-CM | POA: Diagnosis not present

## 2020-08-25 DIAGNOSIS — Z9049 Acquired absence of other specified parts of digestive tract: Secondary | ICD-10-CM

## 2020-08-25 DIAGNOSIS — Z7189 Other specified counseling: Secondary | ICD-10-CM | POA: Diagnosis not present

## 2020-08-25 DIAGNOSIS — I5043 Acute on chronic combined systolic (congestive) and diastolic (congestive) heart failure: Secondary | ICD-10-CM | POA: Diagnosis not present

## 2020-08-25 DIAGNOSIS — I509 Heart failure, unspecified: Secondary | ICD-10-CM | POA: Diagnosis not present

## 2020-08-25 DIAGNOSIS — Z01818 Encounter for other preprocedural examination: Secondary | ICD-10-CM

## 2020-08-25 DIAGNOSIS — Z87891 Personal history of nicotine dependence: Secondary | ICD-10-CM

## 2020-08-25 DIAGNOSIS — E669 Obesity, unspecified: Secondary | ICD-10-CM | POA: Diagnosis present

## 2020-08-25 DIAGNOSIS — I10 Essential (primary) hypertension: Secondary | ICD-10-CM | POA: Diagnosis not present

## 2020-08-25 LAB — CBC WITH DIFFERENTIAL/PLATELET
Abs Immature Granulocytes: 0.75 10*3/uL — ABNORMAL HIGH (ref 0.00–0.07)
Basophils Absolute: 0.1 10*3/uL (ref 0.0–0.1)
Basophils Relative: 1 %
Eosinophils Absolute: 0.2 10*3/uL (ref 0.0–0.5)
Eosinophils Relative: 2 %
HCT: 35.9 % — ABNORMAL LOW (ref 36.0–46.0)
Hemoglobin: 10.7 g/dL — ABNORMAL LOW (ref 12.0–15.0)
Immature Granulocytes: 7 %
Lymphocytes Relative: 16 %
Lymphs Abs: 1.7 10*3/uL (ref 0.7–4.0)
MCH: 30.7 pg (ref 26.0–34.0)
MCHC: 29.8 g/dL — ABNORMAL LOW (ref 30.0–36.0)
MCV: 103.2 fL — ABNORMAL HIGH (ref 80.0–100.0)
Monocytes Absolute: 1.9 10*3/uL — ABNORMAL HIGH (ref 0.1–1.0)
Monocytes Relative: 18 %
Neutro Abs: 6 10*3/uL (ref 1.7–7.7)
Neutrophils Relative %: 56 %
Platelets: 135 10*3/uL — ABNORMAL LOW (ref 150–400)
RBC: 3.48 MIL/uL — ABNORMAL LOW (ref 3.87–5.11)
RDW: 15.9 % — ABNORMAL HIGH (ref 11.5–15.5)
WBC: 10.5 10*3/uL (ref 4.0–10.5)
nRBC: 1.9 % — ABNORMAL HIGH (ref 0.0–0.2)

## 2020-08-25 LAB — LACTATE DEHYDROGENASE: LDH: 309 U/L — ABNORMAL HIGH (ref 98–192)

## 2020-08-25 LAB — BASIC METABOLIC PANEL
Anion gap: 12 (ref 5–15)
BUN: 20 mg/dL (ref 8–23)
CO2: 22 mmol/L (ref 22–32)
Calcium: >15 mg/dL (ref 8.9–10.3)
Chloride: 108 mmol/L (ref 98–111)
Creatinine, Ser: 1.99 mg/dL — ABNORMAL HIGH (ref 0.44–1.00)
GFR, Estimated: 24 mL/min — ABNORMAL LOW (ref >60)
Glucose, Bld: 113 mg/dL — ABNORMAL HIGH (ref 70–99)
Potassium: 4.3 mmol/L (ref 3.5–5.1)
Sodium: 142 mmol/L (ref 135–145)

## 2020-08-25 LAB — HEPATIC FUNCTION PANEL
ALT: 23 U/L (ref 0–44)
AST: 36 U/L (ref 15–41)
Albumin: 2.8 g/dL — ABNORMAL LOW (ref 3.5–5.0)
Alkaline Phosphatase: 105 U/L (ref 38–126)
Bilirubin, Direct: 0.2 mg/dL (ref 0.0–0.2)
Indirect Bilirubin: 0.9 mg/dL (ref 0.3–0.9)
Total Bilirubin: 1.1 mg/dL (ref 0.3–1.2)
Total Protein: >12 g/dL — ABNORMAL HIGH (ref 6.5–8.1)

## 2020-08-25 LAB — PROTIME-INR
INR: 3.1 — ABNORMAL HIGH (ref 0.8–1.2)
Prothrombin Time: 30.6 seconds — ABNORMAL HIGH (ref 11.4–15.2)

## 2020-08-25 LAB — CBG MONITORING, ED: Glucose-Capillary: 108 mg/dL — ABNORMAL HIGH (ref 70–99)

## 2020-08-25 LAB — RESP PANEL BY RT-PCR (FLU A&B, COVID) ARPGX2
Influenza A by PCR: NEGATIVE
Influenza B by PCR: NEGATIVE
SARS Coronavirus 2 by RT PCR: NEGATIVE

## 2020-08-25 LAB — TROPONIN I (HIGH SENSITIVITY): Troponin I (High Sensitivity): 30 ng/L — ABNORMAL HIGH (ref <18)

## 2020-08-25 LAB — LACTIC ACID, PLASMA: Lactic Acid, Venous: 2 mmol/L (ref 0.5–1.9)

## 2020-08-25 LAB — BRAIN NATRIURETIC PEPTIDE: B Natriuretic Peptide: 1057 pg/mL — ABNORMAL HIGH (ref 0.0–100.0)

## 2020-08-25 MED ORDER — CALCITONIN (SALMON) 200 UNIT/ML IJ SOLN
4.0000 [IU]/kg | Freq: Two times a day (BID) | INTRAMUSCULAR | Status: AC
  Administered 2020-08-25 – 2020-08-27 (×4): 300 [IU] via SUBCUTANEOUS
  Filled 2020-08-25 (×4): qty 1.5

## 2020-08-25 MED ORDER — ACETAMINOPHEN 325 MG PO TABS: 650.0000 mg | ORAL_TABLET | Freq: Once | ORAL | Status: DC

## 2020-08-25 MED ORDER — PANTOPRAZOLE SODIUM 40 MG PO TBEC
40.0000 mg | DELAYED_RELEASE_TABLET | Freq: Every day | ORAL | Status: DC
  Administered 2020-08-25 – 2020-08-27 (×3): 40 mg via ORAL
  Filled 2020-08-25 (×3): qty 1

## 2020-08-25 MED ORDER — ACETAMINOPHEN 325 MG PO TABS: 650.0000 mg | ORAL_TABLET | ORAL | Status: DC | PRN

## 2020-08-25 MED ORDER — SODIUM CHLORIDE 0.9 % IV SOLN: Freq: Once | INTRAVENOUS | Status: AC

## 2020-08-25 MED ORDER — DOCUSATE SODIUM 100 MG PO CAPS
100.0000 mg | ORAL_CAPSULE | Freq: Every day | ORAL | Status: DC
  Administered 2020-08-26 – 2020-08-27 (×2): 100 mg via ORAL
  Filled 2020-08-25 (×2): qty 1

## 2020-08-25 MED ORDER — ONDANSETRON HCL 4 MG/2ML IJ SOLN: 4.0000 mg | Freq: Four times a day (QID) | INTRAMUSCULAR | Status: DC | PRN

## 2020-08-25 MED ORDER — ZOLEDRONIC ACID 4 MG/5ML IV CONC
4.0000 mg | Freq: Once | INTRAVENOUS | Status: AC
  Administered 2020-08-25: 4 mg via INTRAVENOUS
  Filled 2020-08-25: qty 5

## 2020-08-25 MED ORDER — HYDRALAZINE HCL 50 MG PO TABS
50.0000 mg | ORAL_TABLET | Freq: Three times a day (TID) | ORAL | Status: DC
  Administered 2020-08-26 (×4): 50 mg via ORAL
  Filled 2020-08-25 (×4): qty 1

## 2020-08-25 MED ORDER — SODIUM CHLORIDE 0.9 % IV SOLN: INTRAVENOUS | Status: DC

## 2020-08-25 MED ORDER — AMIODARONE HCL 200 MG PO TABS
200.0000 mg | ORAL_TABLET | Freq: Every day | ORAL | Status: DC
  Administered 2020-08-25 – 2020-08-27 (×3): 200 mg via ORAL
  Filled 2020-08-25 (×3): qty 1

## 2020-08-25 MED ORDER — DEXAMETHASONE SODIUM PHOSPHATE 10 MG/ML IJ SOLN
10.0000 mg | Freq: Once | INTRAMUSCULAR | Status: AC
  Administered 2020-08-25: 19:00:00 10 mg via INTRAVENOUS
  Filled 2020-08-25: qty 1

## 2020-08-25 NOTE — ED Notes (Signed)
Patient's LVAD transferred from wall power to batteries for transport.

## 2020-08-25 NOTE — ED Notes (Signed)
102 doppler MAP

## 2020-08-25 NOTE — H&P (Addendum)
Advanced Heart Failure VAD H&P Note   PCP-Cardiologist: [name]   Reason for Consult: Weakness, altered mental status  HPI:    Brianna Hanson is an 85 y.o. female with severe systolic HF due to NICM s/p HM 3 LVAD (04/06/13), PAF, plasma cell disorder (Likely IgA MGUS) - followed by Dr. Alen Hanson (followed q 6 months) who is being admitted for weakness, altered mental status and hypercalcemia in setting of likely new diagnosis of multiple myeloma.   Admitted 4/21 with compression fx and underwent multi-level kyphoplasty.   Seen in ER on 12/20/21w/ atypical chest pain and dyspnea. W/u without obvious cause. Hanson/c'Hanson home.   Has continued to feel poorly. Came back to ER today with weakness and AMS. BNP 1,057. Ca > 15 total protein > 12 Lactic acid 2.0  CT head with multiple lytic lesions concerning for multiple myeloma   On exam weak but non-focal. No CP or SOB currently.    LVAD INTERROGATION:  HeartMate II LVAD:  Flow 4.3 liters/min, speed 8600, power 4.5, PI 6.4. VAD interrogated personally. Parameters stable.   GI Issues 09/2016 LGIB. Angio- no source of bleeding. LVAD speed turned down to 8400  LVAD Speed Change 12/2016 Ramp ECHO -->Speed increased from 8400>8800  Significant Events on VAD Support:  09/21/16 GIB, Ramp echo speed decreased to 8400  01/08/17 - LOW FLOW alarms; speed increased to 8800 RPM 05/20/17>speed decreased to 8600 06/18/19 - DC-CV of AF   Review of Systems: [y] = yes, [ ]  = no   General: Weight gain [ ] ; Weight loss [ ] ; Anorexia [ ] ; Fatigue Blue.Reese ]; Fever [ ] ; Chills [ ] ; Weakness Blue.Reese ]  Cardiac: Chest pain/pressure [ Y]; Resting SOB [ ] ; Exertional SOB [Y ]; Orthopnea [ ] ; Pedal Edema [ ] ; Palpitations [ ] ; Syncope [ ] ; Presyncope [ ] ; Paroxysmal nocturnal dyspnea[ ]   Pulmonary: Cough Jazmín.Cullens ]; Wheezing[ ] ; Hemoptysis[ ] ; Sputum [ ] ; Snoring [ ]   GI: Vomiting[ ] ; Dysphagia[ ] ; Melena[ ] ; Hematochezia [ ] ; Heartburn[ ] ; Abdominal pain [ ] ; Constipation [ ] ;  Diarrhea [ ] ; BRBPR [ ]   GU: Hematuria[ ] ; Dysuria [ ] ; Nocturia[ ]   Vascular: Pain in legs with walking [ ] ; Pain in feet with lying flat [ ] ; Non-healing sores [ ] ; Stroke [ ] ; TIA [ ] ; Slurred speech [ ] ;  Neuro: Headaches[ ] ; Vertigo[ ] ; Seizures[ ] ; Paresthesias[ ] ;Blurred vision [ ] ; Diplopia [ ] ; Vision changes [ ]   Ortho/Skin: Arthritis Blue.Reese ]; Joint pain Blue.Reese ]; Muscle pain [ ] ; Joint swelling [ ] ; Back Pain Blue.Reese ]; Rash [ ]   Psych: Depression[ ] ; Anxiety[ ]   Heme: Bleeding problems [ ] ; Clotting disorders [ ] ; Anemia [ ]   Endocrine: Diabetes [ ] ; Thyroid dysfunction[ ]     Home Medications Prior to Admission medications   Medication Sig Start Date End Date Taking? Authorizing Provider  amiodarone (PACERONE) 200 MG tablet TAKE 1 TABLET BY MOUTH EVERY DAY 03/29/20   Brianna Hanson, Brianna Pascal, MD  calcitonin, salmon, (MIACALCIN/FORTICAL) 200 UNIT/ACT nasal spray Place 1 spray into alternate nostrils daily. 02/25/20   Brianna Hanson, Brianna Pascal, MD  carvedilol (COREG) 6.25 MG tablet TAKE 1 TABLET BY MOUTH 2 TIMES DAILY WITH A MEAL 07/10/20   Brianna Hanson, Brianna Pascal, MD  Cholecalciferol (VITAMIN D3) 25 MCG (1000 UT) CAPS Take 1,000 Units by mouth daily.     [provider]  docusate sodium (COLACE) 100 MG capsule Take 100 mg by mouth daily.    [provider]  ferrous sulfate 325 (65 FE) MG tablet Take 1 tablet (325 mg total) by mouth 2 (two) times daily with a meal. 07/11/15   Brianna Hanson, Brianna Pascal, MD  gabapentin (NEURONTIN) 100 MG capsule Take 3 capsules (300 mg total) by mouth 2 (two) times daily. Patient taking differently: Take 100 mg by mouth 2 (two) times daily.  10/25/19   Brianna Hanson, Brianna Pascal, MD  hydrALAZINE (APRESOLINE) 100 MG tablet Take 0.5 tablets (50 mg total) by mouth 3 (three) times daily. 01/07/20   Brianna Hanson, Brianna D, NP  losartan (COZAAR) 50 MG tablet Take 1 tablet (50 mg total) by mouth daily. 04/13/20   Brianna Hanson, Brianna Pascal, MD  magnesium oxide (MAG-OX) 400 (241.3 Mg) MG tablet Take 0.5  tablets (200 mg total) by mouth 2 (two) times daily. 02/17/20   Brianna Hanson, Brianna Pascal, MD  ondansetron (ZOFRAN) 4 MG tablet Take 1 tablet (4 mg total) by mouth every 8 (eight) hours as needed for nausea or vomiting. Patient not taking: Reported on 06/08/2020 12/22/19   Brianna Hanson, Brianna Pascal, MD  pantoprazole (PROTONIX) 40 MG tablet TAKE 1 TABLET BY MOUTH EVERY DAY Patient not taking: Reported on 08/09/2020 06/26/20   Brianna Hanson, Brianna Pascal, MD  potassium chloride SA (KLOR-CON M20) 20 MEQ tablet Take 1 tablet (20 mEq total) by mouth 2 (two) times daily. 08/16/19   Brianna Hanson, Brianna Pascal, MD  promethazine (PHENERGAN) 12.5 MG tablet Take 1 tablet (12.5 mg total) by mouth every 6 (six) hours as needed for nausea or vomiting. Patient not taking: Reported on 06/08/2020 12/22/19   Brianna Hanson, Brianna Pascal, MD  traMADol (ULTRAM) 50 MG tablet Take 2 tablets (100 mg total) by mouth every 12 (twelve) hours as needed for moderate pain. 06/08/20   Brianna Hallisey, Brianna Pascal, MD  warfarin (COUMADIN) 2.5 MG tablet Take 5 mg (2 tabs) every Monday and 2.5 mg (1 tab) all other days or as directed by HF Clinic 03/14/20   Orma Render, RPH-CPP    Past Medical History: Past Medical History:  Diagnosis Date  . AICD (automatic cardioverter/defibrillator) present   . Anemia   . Atrial fibrillation or flutter   . Cardiac arrest - ventricular fibrillation 12/10   with successful resucitation, S/p ICD  . CHF (congestive heart failure) ([place])   . Diverticula of colon 2011  . HTN (hypertension)    moderate  . ICD (implantable cardiac defibrillator) in place    she has received appropriate therapy for VF  . Internal and external hemorrhoids without complication 1610  . Nonischemic cardiomyopathy ([place])    follow[facility] by Dr April Holding at [facility]  . Osteopenia   . Plasma cell disorder 03/20/2012  . Plasma cell disorder 03/20/2012  . Sessile colonic polyp 2011   Dr Benson Norway    Past Surgical History: Past Surgical History:  Procedure Laterality Date  .  ABDOMINAL HYSTERECTOMY    . CARDIAC CATHETERIZATION    . CARDIAC DEFIBRILLATOR PLACEMENT     by Greggory Brandy for secondary prevention of sudden death  . CARDIOVERSION N/A 2019/07/13   Procedure: CARDIOVERSION;  Surgeon: Jolaine Artist, MD;  Location: [facility] ENDOSCOPY;  Service: Cardiovascular;  Laterality: N/A;  . CHOLECYSTECTOMY N/A 10/09/2015   Procedure: LAPAROSCOPIC CHOLECYSTECTOMY;  Surgeon: Rolm Bookbinder, MD;  Location: [facility];  Service: General;  Laterality: N/A;  . COLONOSCOPY W/ POLYPECTOMY  2011   Dr Benson Norway  . INSERTION OF IMPLANTABLE LEFT VENTRICULAR ASSIST DEVICE N/A 04/06/2013   Procedure: INSERTION OF IMPLANTABLE LEFT VENTRICULAR ASSIST DEVICE;  Surgeon: Gaye Pollack, MD;  Location: [facility] OR;  Service: Open Heart Surgery;  Laterality: N/A;  . INTRAOPERATIVE TRANSESOPHAGEAL ECHOCARDIOGRAM N/A 04/06/2013   Procedure: INTRAOPERATIVE TRANSESOPHAGEAL ECHOCARDIOGRAM;  Surgeon: Gaye Pollack, MD;  Location: [facility] OR;  Service: Open Heart Surgery;  Laterality: N/A;  . IR GENERIC HISTORICAL  09/24/2016   IR US GUIDE VASC ACCESS RIGHT 09/24/2016 Greggory Keen, MD [facility]-INTER[facility]  . IR GENERIC HISTORICAL  09/24/2016   IR ANGIOGRAM VISCERAL SELECTIVE 09/24/2016 Greggory Keen, MD [facility]  . IR GENERIC HISTORICAL  09/24/2016   IR ANGIOGRAM VISCERAL SELECTIVE 09/24/2016 Greggory Keen, MD [facility]  . IR VERTEBROPLASTY EA ADDL (T&LS) BX INC UNI/BIL INC INJECT/IMAGING  12/29/2019  . IR VERTEBROPLASTY EA ADDL (T&LS) BX INC UNI/BIL INC INJECT/IMAGING  12/29/2019  . IR VERTEBROPLASTY EA ADDL (T&LS) BX INC UNI/BIL INC INJECT/IMAGING  12/29/2019  . IR VERTEBROPLASTY LUMBAR BX INC UNI/BIL INC/INJECT/IMAGING  12/29/2019  . PLACEMENT OF CENTRIMAG VENTRICULAR ASSIST DEVICE Right 04/06/2013   Procedure: PLACEMENT OF CENTRIMAG VENTRICULAR ASSIST DEVICE;  Surgeon: Gaye Pollack, MD;  Location: [facility];  Service: Open Heart Surgery;  Laterality: Right;  . RADIOLOGY WITH ANESTHESIA N/A 12/29/2019   Procedure: KYPHOPLASTY;   Surgeon: Luanne Bras, MD;  Location: [facility];  Service: Radiology;  Laterality: N/A;    Family History: Family History  Problem Relation Age of Onset  . Stroke Mother   . Stroke Brother     Social History: Social History   Socioeconomic History  . Marital status: Soil scientist    Spouse name: Not on file  . Number of children: 3  . Years of education: Not on file  . Highest education level: Not on file  Occupational History  . Not on file  Tobacco Use  . Smoking status: Never Smoker  . Smokeless tobacco: Never Used  . Tobacco comment: remote history of tobacco abuse  Vaping Use  . Vaping Use: Never used  Substance and Sexual Activity  . Alcohol use: Yes    Comment: occsionally wine  . Drug use: No  . Sexual activity: Not Currently    Birth control/protection: Surgical  Other Topics Concern  . Not on file  Social History Narrative   Lives with spouse who was recently diagnosed with esophageal ca and is receiving chemotherapy   Social Determinants of Health   Financial Resource Strain: Not on file  Food Insecurity: Not on file  Transportation Needs: Not on file  Physical Activity: Not on file  Stress: Not on file  Social Connections: Not on file    Allergies:  Allergies  Allergen Reactions  . Oxycodone Other (See Comments)    Hallucinations     Objective:    Vital Signs:   Temp:  [98.2 F (36.8 C)-99.8 F (37.7 C)] 99.8 F (37.7 C) (12/24 1321) Pulse Rate:  [91-104] 98 (12/24 1645) Resp:  [12-30] 30 (12/24 1645) BP: (91-118)/(66-87) 111/83 (12/24 1645) SpO2:  [86 %-100 %] 92 % (12/24 1645) Weight:  [74.8 kg] 74.8 kg (12/24 1309)   Filed Weights   08/14/2020 1309  Weight: 74.8 kg    Mean arterial Pressure 100  Physical Exam    General: Weak appearing  NAD. Will arouse and then fall back to sleep  HEENT: normal  Neck: supple. JVP not elevated.  Carotids 2+ bilat; no bruits. No lymphadenopathy or thryomegaly appreciated. Cor: LVAD hum.   Lungs: Clear. Abdomen: obese soft, nontender, non-distended. No hepatosplenomegaly. No bruits or masses. Good bowel sounds. Driveline site clean. Anchor in place.  Extremities: no cyanosis, clubbing, rash. Warm no edema  Neuro: lethargic. Will arouse and interact with me and then fall back to sleep. No focal abnormalities.    Telemetry   NSR 90s Personally reviewed  Labs    Basic Metabolic Panel: Recent Labs  Lab 08/21/20 1551 08/02/2020 1300  NA 136 142  K 5.7* 4.3  CL 104 108  CO2 22 22  GLUCOSE 102* 113*  BUN 17 20  CREATININE 1.32* 1.99*  CALCIUM 11.2* >15.0*    Liver Function Tests: Recent Labs  Lab 08/31/2020 1245  AST 36  ALT 23  ALKPHOS 105  BILITOT 1.1  PROT >12.0*  ALBUMIN 2.8*   No results for input(s): LIPASE, AMYLASE in the last 168 hours. No results for input(s): AMMONIA in the last 168 hours.  CBC: Recent Labs  Lab 08/21/20 1551 08/26/2020 1300  WBC 9.1 10.5  NEUTROABS  --  6.0  HGB 10.5* 10.7*  HCT 35.2* 35.9*  MCV 102.6* 103.2*  PLT 177 135*    Cardiac Enzymes: No results for input(s): CKTOTAL, CKMB, CKMBINDEX, TROPONINI in the last 168 hours.  BNP: BNP (last 3 results) Recent Labs    08/06/2020 1245  BNP 1,057.0*    ProBNP (last 3 results) No results for input(s): PROBNP in the last 8760 hours.   CBG: Recent Labs  Lab 08/13/2020 1223  GLUCAP 108*    Coagulation Studies: Recent Labs    08/09/2020 1300  LABPROT 30.6*  INR 3.1*    Other results: EKG: Poor quality tracing (LVAD), underlying rhythm sinus, 78 bpm. No ST abnormalities     Imaging    CT Head Wo Contrast  Result Date: 08/11/2020 CLINICAL DATA:  Mental status changes, lethargy and weakness for 2 days; history of atrial fibrillation, CHF, hypertension, non ischemic cardiomyopathy, plasma cell disorder EXAM: CT HEAD WITHOUT CONTRAST TECHNIQUE: Contiguous axial images were obtained from the base of the skull through the vertex without intravenous contrast.  Sagittal and coronal MPR images reconstructed from axial data set. COMPARISON:  At 08/08/2012 FINDINGS: Brain: Normal ventricular morphology. No midline shift or mass effect. Normal appearance of brain parenchyma. No intracranial hemorrhage, mass lesion or evidence of acute infarction. No extra-axial fluid collections. Vascular: No hyperdense vessels. Atherosclerotic calcification of internal carotid and vertebral arteries at skull base noted. Skull: Osseous demineralization. Multiple lytic foci are seen without surrounding sclerosis, highly suspicious for multiple myeloma in this patient with a history of a plasma cell disorder. Sinuses/Orbits: Clear Other: N/A IMPRESSION: No acute intracranial abnormalities. Multiple lytic foci throughout the skull base highly suspicious for multiple myeloma in this patient with a history of a plasma cell disorder; differential diagnosis includes lytic metastatic disease as well. Findings called to Dr. Ronnald Nian on 08/26/2020 at 1428 hours. Electronically Signed   By: Lavonia Dana M.Hanson.   On: 08/11/2020 14:28   DG Chest Portable 1 View  Result Date: 08/19/2020 CLINICAL DATA:  Weakness, oxygen desaturation, less responsive, shortness of breath, lethargy EXAM: PORTABLE CHEST 1 VIEW COMPARISON:  Portable exam 1245 hours compared to 08/21/2020 FINDINGS: LEFT subclavian ICD with leads projecting over RIGHT atrium and RIGHT ventricle. LVAD noted. Enlargement of cardiac silhouette with slight vascular congestion. Atherosclerotic calcification aorta. Low lung volumes with bibasilar atelectasis. Question minimal pulmonary edema. No pleural effusion or pneumothorax. Bones demineralized. IMPRESSION: Enlargement of cardiac silhouette with vascular congestion post ICD and LVAD. Bibasilar atelectasis and question minimal pulmonary edema. Electronically Signed   By: Lavonia Dana M.Hanson.   On: 08/20/2020 12:55  Assessment/Plan:    1. Hypercalcemia, hyperproteinemia and lytic skull lesions  in setting of known MGUS' - almost certainly has conversion to multiple myeloma - will start IVF - give calcitonin 4 IUs/kg & zoledronic acid $RemoveBeforeD'4mg'voolnRYhkaljQL$  (over 15 mins) - ER has spoken with Dr. Elson Areas in Oncology. Have suggested decadron. They will see in am.  - will check SPEP/UPEP, serum free light chains, skeletal survey and beta-2 microglobuilin (will discuss further imaging with Oncology)  2. 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 Chronic systolic HF: NICM, s/p ICD and LVAD for DT(04/2013).  - now > 7 years out on VAD support - stable. Watch volume status with hydratio   4.  LVAD placed for DT 04/2013:  - VAD interrogated personally. Parameters stable. - No ASA with GI bleed and intolerance.  - INR 3.1 Hold coumadin for possible BMBx - INR Goal 1.8-2.3   5. PAF:  - Did not tolerate AF well at all.  - S/p DC-CV 06/18/19 - Remains in NSR on  amiodarone 200 daily.  6.  HTN:  - MAPs stable     I reviewed the LVAD parameters from today, and compared the results to the patient's prior recorded data.  No programming changes were made.  The LVAD is functioning within specified parameters.  The patient performs LVAD self-test daily.  LVAD interrogation was negative for any significant power changes, alarms or PI events/speed drops.  LVAD equipment check completed and is in good working order.  Back-up equipment present.   LVAD education done on emergency procedures and precautions and reviewed exit site care.  Length of Stay: 0  Glori Bickers, MD 08/28/2020, 5:10 PM  VAD Team Pager [phone] (7am - 7am) +++VAD ISSUES ONLY+++   Advanced Heart Failure Team Pager [phone] (M-F; [place])  Please contact [facility] for night-coverage after hours (4p -7a ) and weekends on [email or web address] for all non- LVAD Issues

## 2020-08-25 NOTE — Progress Notes (Signed)
LVAD Coordinator ED Encounter  Brianna Hanson a 85 y.o. female that presented to [facility] ER today due to fatigue, lethargy, and left side chest pain. She  has a past medical history  has a past medical history of AICD (automatic cardioverter/defibrillator) present, Anemia, Atrial fibrillation or flutter, Cardiac arrest - ventricular fibrillation (12/10), CHF (congestive heart failure) ([place]), Diverticula of colon (2011), HTN (hypertension), ICD (implantable cardiac defibrillator) in place, Internal and external hemorrhoids without complication (3559), Nonischemic cardiomyopathy ([place]), Osteopenia, Plasma cell disorder (03/20/2012), Plasma cell disorder (03/20/2012), and Sessile colonic polyp (2011).[name]   LVAD is a HM II and was implanted on 04/06/13 by Dr Cyndia Bent.  Received a page from the ER stating patient had just arrived to room 18. I spoke directly with her husband and he reported that Brianna Hanson's cough has improved (see previous ER documentation), but she has been in bed for the last 2 days, sleeping all of the time. He states that she has been arouseable, but today she is "difficult to understand" when she tries to speak. He is concerned "that she may have had a stroke."   Spoke with Dr Ronnald Nian re: plan of care. Plan for head CT, labs, urine, and chest xray.   Addendum 1430: Spoke with Dr Ronnald Nian re: head CT results and calcium >15. Plan to admit patient to [facility] for further workup.   Vital signs: HR: 95 Doppler MAP: 120 Automated BP: 112/79 O2 Sat: 86% on RA  94% on O2  LVAD interrogation reveals:  Speed: 8600 Flow: 4.1 Power: 4.4  PI: 6.4  Alarms: none  Drive Line: Dressing change performed weekly by her husband Brianna Hanson.   Significant Events with LVAD: 09/21/16 GIB, Ramp echo speed decreased to 8400  01/08/17 - LOW FLOW alarms; speed increased to 8800 RPM 05/20/17>speed decreased to 8600   Updated VAD Providers Dr Haroldine Laws about the above. No LVAD issues and pump is functioning as expected.  Able to independently manage LVAD equipment. No LVAD needs at this time.   [name] with charge RN on 2C re: patient and need for admission.   Emerson Monte RN [place] Coordinator  Office: [phone]  24/7 Pager: [phone]

## 2020-08-25 NOTE — ED Notes (Signed)
Got patient on the monitor patient is resting with family at bedside and call bell in reach  °

## 2020-08-25 NOTE — ED Notes (Addendum)
This RN does not know where to chart on LVAD, Cannot find heartmate 2 on my flowsheets however, this RN ausculted pump and humming was heard

## 2020-08-25 NOTE — ED Provider Notes (Signed)
[facility] EMERGENCY DEPARTMENT Provider Note   CSN: [id number] Arrival date & time: 08/10/2020  1217     History Chief Complaint  Patient presents with  . Fatigue  . LVAD    Brianna Hanson is a 85 y.o. female.  The history is provided by the patient and a caregiver.  Weakness Severity:  Moderate Onset quality:  Gradual Timing:  Constant Progression:  Worsening Chronicity:  New Context comment:  LVAD patient who presents with generalized weakness and chest pain ongoing for days, decreased PO intake. Started doxycyline two days ago for possible pneumonia, seen in ED several days ago with unremarkable workup. Relieved by:  Nothing Worsened by:  Nothing Associated symptoms: chest pain and cough   Associated symptoms: no abdominal pain, no anorexia, no arthralgias, no dysuria, no fever, no foul-smelling urine, no loss of consciousness, no nausea, no seizures, no shortness of breath and no vomiting        Past Medical History:  Diagnosis Date  . AICD (automatic cardioverter/defibrillator) present   . Anemia   . Atrial fibrillation or flutter   . Cardiac arrest - ventricular fibrillation 12/10   with successful resucitation, S/p ICD  . CHF (congestive heart failure) ([place])   . Diverticula of colon 2011  . HTN (hypertension)    moderate  . ICD (implantable cardiac defibrillator) in place    she has received appropriate therapy for VF  . Internal and external hemorrhoids without complication 3419  . Nonischemic cardiomyopathy ([place])    followed by Dr April Holding at [facility]  . Osteopenia   . Plasma cell disorder 03/20/2012  . Plasma cell disorder 03/20/2012  . Sessile colonic polyp 2011   Dr Benson Norway    Patient Active Problem List   Diagnosis Date Noted  . Malnutrition of moderate degree 12/25/2019  . Nausea and vomiting 12/22/2019  . Intractable nausea and vomiting 12/22/2019  . Closed compression fracture of L3 lumbar vertebra, initial encounter ([place])  11/24/2019  . Primary osteoarthritis of both hips 09/07/2019  . Acute hip pain, bilateral 09/06/2019  . Osteoporosis with pathological fracture 09/06/2019  . Lumbar compression fracture ([place]) 08/13/2019  . Acute left-sided low back pain with left-sided sciatica 08/04/2019  . Closed wedge compression fracture of first lumbar vertebra ([place]) 08/04/2019  . Hyperlipidemia LDL goal <130 03/04/2018  . Eczema of lower extremity 03/04/2018  . Diverticulosis of colon with hemorrhage   . Primary osteoarthritis of left knee 09/05/2016  . Iron deficiency anemia 07/11/2015  . Chronic anticoagulation 11/24/2013  . Chronic systolic congestive heart failure ([place]) 05/01/2013  . Abdominal pain 04/18/2013  . LVAD (left ventricular assist device) present ([place]) 04/07/2013  . Right heart failure ([place]) 03/29/2013  . PAH (pulmonary artery hypertension) ([place]) 03/29/2013  . Plasma cell disorder 03/20/2012  . Long term current use of anticoagulant therapy 02/14/2012  . Other abnormal glucose 02/03/2012  . Chronic renal insufficiency, stage II (mild) 02/03/2012  . Atrial fibrillation ([place]) 11/16/2010  . Essential hypertension, benign 05/13/2007  . CARDIOMYOPATHY, PRIMARY NEC 05/13/2007  . OSTEOPENIA 05/13/2007    Past Surgical History:  Procedure Laterality Date  . ABDOMINAL HYSTERECTOMY    . CARDIAC CATHETERIZATION    . CARDIAC DEFIBRILLATOR PLACEMENT     by Greggory Brandy for secondary prevention of sudden death  . CARDIOVERSION N/A 24-Jun-2019   Procedure: CARDIOVERSION;  Surgeon: Jolaine Artist, MD;  Location: [facility];  Service: Cardiovascular;  Laterality: N/A;  . CHOLECYSTECTOMY N/A 10/09/2015   Procedure: LAPAROSCOPIC CHOLECYSTECTOMY;  Surgeon: Rolm Bookbinder, MD;  Location: [place];  Service: General;  Laterality: N/A;  . COLONOSCOPY W/ POLYPECTOMY  2011   Dr Benson Norway  . INSERTION OF IMPLANTABLE LEFT VENTRICULAR ASSIST DEVICE N/A 04/06/2013   Procedure: INSERTION OF IMPLANTABLE LEFT VENTRICULAR ASSIST  DEVICE;  Surgeon: Gaye Pollack, MD;  Location: [place];  Service: Open Heart Surgery;  Laterality: N/A;  . INTRAOPERATIVE TRANSESOPHAGEAL ECHOCARDIOGRAM N/A 04/06/2013   Procedure: INTRAOPERATIVE TRANSESOPHAGEAL ECHOCARDIOGRAM;  Surgeon: Gaye Pollack, MD;  Location: [facility];  Service: Open Heart Surgery;  Laterality: N/A;  . IR GENERIC HISTORICAL  09/24/2016   IR US GUIDE VASC ACCESS RIGHT 09/24/2016 Greggory Keen, MD [facility]  . IR GENERIC HISTORICAL  09/24/2016   IR ANGIOGRAM VISCERAL SELECTIVE 09/24/2016 Greggory Keen, MD [facility]  . IR GENERIC HISTORICAL  09/24/2016   IR ANGIOGRAM VISCERAL SELECTIVE 09/24/2016 Greggory Keen, MD [facility]  . IR VERTEBROPLASTY EA ADDL (T&LS) BX INC UNI/BIL INC INJECT/IMAGING  12/29/2019  . IR VERTEBROPLASTY EA ADDL (T&LS) BX INC UNI/BIL INC INJECT/IMAGING  12/29/2019  . IR VERTEBROPLASTY EA ADDL (T&LS) BX INC UNI/BIL INC INJECT/IMAGING  12/29/2019  . IR VERTEBROPLASTY LUMBAR BX INC UNI/BIL INC/INJECT/IMAGING  12/29/2019  . PLACEMENT OF CENTRIMAG VENTRICULAR ASSIST DEVICE Right 04/06/2013   Procedure: PLACEMENT OF CENTRIMAG VENTRICULAR ASSIST DEVICE;  Surgeon: Gaye Pollack, MD;  Location: [place];  Service: Open Heart Surgery;  Laterality: Right;  . RADIOLOGY WITH ANESTHESIA N/A 12/29/2019   Procedure: KYPHOPLASTY;  Surgeon: Luanne Bras, MD;  Location: [place];  Service: Radiology;  Laterality: N/A;     OB History   No obstetric history on file.     Family History  Problem Relation Age of Onset  . Stroke Mother   . Stroke Brother     Social History   Tobacco Use  . Smoking status: Never Smoker  . Smokeless tobacco: Never Used  . Tobacco comment: remote history of tobacco abuse  Vaping Use  . Vaping Use: Never used  Substance Use Topics  . Alcohol use: Yes    Comment: occsionally wine  . Drug use: No    Home Medications Prior to Admission medications   Medication Sig Start Date End Date Taking? Authorizing Provider  amiodarone  (PACERONE) 200 MG tablet TAKE 1 TABLET BY MOUTH EVERY DAY Patient taking differently: Take 200 mg by mouth daily. 03/29/20  Yes [name], Shaune Pascal, MD  calcitonin, salmon, (MIACALCIN/FORTICAL) 200 UNIT/ACT nasal spray Place 1 spray into alternate nostrils daily. 02/25/20  Yes [name], Shaune Pascal, MD  carvedilol (COREG) 6.25 MG tablet TAKE 1 TABLET BY MOUTH 2 TIMES DAILY WITH A MEAL Patient taking differently: Take 6.25 mg by mouth 2 (two) times daily with a meal. 07/10/20  Yes [name], Shaune Pascal, MD  Cholecalciferol (VITAMIN D3) 25 MCG (1000 UT) CAPS Take 1,000 Units by mouth daily.    Yes [provider]  docusate sodium (COLACE) 100 MG capsule Take 100 mg by mouth daily.   Yes [provider]  doxycycline (VIBRAMYCIN) 50 MG capsule Take 2 capsules (100 mg total) by mouth 2 (two) times daily. 08/22/20  Yes [name], Shaune Pascal, MD  ferrous sulfate 325 (65 FE) MG tablet Take 1 tablet (325 mg total) by mouth 2 (two) times daily with a meal. 07/11/15  Yes [name], Shaune Pascal, MD  furosemide (LASIX) 20 MG tablet Take 20 mg by mouth daily as needed for fluid or edema.   Yes [provider]  gabapentin (NEURONTIN) 100  MG capsule Take 3 capsules (300 mg total) by mouth 2 (two) times daily. Patient taking differently: Take 100 mg by mouth in the morning and at bedtime. 10/25/19  Yes [name], Shaune Pascal, MD  hydrALAZINE (APRESOLINE) 100 MG tablet Take 0.5 tablets (50 mg total) by mouth 3 (three) times daily. 01/07/20  Yes Clegg, Amy D, NP  losartan (COZAAR) 50 MG tablet Take 1 tablet (50 mg total) by mouth daily. Patient taking differently: Take 25 mg by mouth daily. 04/13/20  Yes [name], Shaune Pascal, MD  magnesium oxide (MAG-OX) 400 (241.3 Mg) MG tablet Take 0.5 tablets (200 mg total) by mouth 2 (two) times daily. 02/17/20  Yes [name], Shaune Pascal, MD  pantoprazole (PROTONIX) 40 MG tablet TAKE 1 TABLET BY MOUTH EVERY DAY Patient taking differently: Take 40 mg by mouth daily as needed  (for reflux). 06/26/20  Yes [name], Shaune Pascal, MD  potassium chloride SA (KLOR-CON M20) 20 MEQ tablet Take 1 tablet (20 mEq total) by mouth 2 (two) times daily. 08/16/19  Yes [name], Shaune Pascal, MD  traMADol (ULTRAM) 50 MG tablet Take 2 tablets (100 mg total) by mouth every 12 (twelve) hours as needed for moderate pain. 06/08/20  Yes [name], Shaune Pascal, MD  warfarin (COUMADIN) 2.5 MG tablet Take 5 mg (2 tabs) every Monday and 2.5 mg (1 tab) all other days or as directed by HF Clinic Patient taking differently: Take 2.5-5 mg by mouth See admin instructions. Take 1 tablet (2.5 mg totally) by mouth in the evening on Sun/Tues/Wed/Thurs/Fri/Sat and except 2 tablets ( 5 mg totally) by mouth on Mon 03/14/20  Yes Orma Render, RPH-CPP  ondansetron (ZOFRAN) 4 MG tablet Take 1 tablet (4 mg total) by mouth every 8 (eight) hours as needed for nausea or vomiting. 12/22/19   [name], Shaune Pascal, MD  promethazine (PHENERGAN) 12.5 MG tablet Take 1 tablet (12.5 mg total) by mouth every 6 (six) hours as needed for nausea or vomiting. 12/22/19   [name], Shaune Pascal, MD    Allergies    Oxycodone  Review of Systems   Review of Systems  Constitutional: Positive for fatigue. Negative for chills and fever.  HENT: Negative for ear pain and sore throat.   Eyes: Negative for pain and visual disturbance.  Respiratory: Positive for cough. Negative for shortness of breath.   Cardiovascular: Positive for chest pain. Negative for palpitations.  Gastrointestinal: Negative for abdominal pain, anorexia, nausea and vomiting.  Genitourinary: Negative for dysuria and hematuria.  Musculoskeletal: Negative for arthralgias and back pain.  Skin: Negative for color change and rash.  Neurological: Positive for weakness. Negative for seizures, loss of consciousness and syncope.  All other systems reviewed and are negative.   Physical Exam Updated Vital Signs  ED Triage Vitals  Enc Vitals Group     BP 08/28/2020 1228 91/66      Pulse Rate 08/10/2020 1228 (!) 104     Resp 08/20/2020 1228 12     Temp 08/05/2020 1228 98.2 F (36.8 C)     Temp Source 08/28/2020 1228 Oral     SpO2 08/28/2020 1228 (!) 86 %     Weight 08/12/2020 1309 165 lb (74.8 kg)     Height 08/11/2020 1309 5' (1.524 m)     Head Circumference --      Peak Flow --      Pain Score 08/16/2020 1232 0     Pain Loc --      Pain Edu? --  Excl. in [place]? --     Physical Exam Vitals and nursing note reviewed.  Constitutional:      General: She is not in acute distress.    Appearance: She is well-developed and well-nourished. She is ill-appearing.  HENT:     Head: Normocephalic and atraumatic.     Mouth/Throat:     Mouth: Mucous membranes are dry.  Eyes:     Extraocular Movements: Extraocular movements intact.     Conjunctiva/sclera: Conjunctivae normal.     Pupils: Pupils are equal, round, and reactive to light.  Cardiovascular:     Rate and Rhythm: Normal rate and regular rhythm.     Comments: [place] of LVAD on ascultation  Pulmonary:     Effort: Pulmonary effort is normal. No respiratory distress.     Breath sounds: Normal breath sounds.  Abdominal:     General: There is no distension.     Palpations: Abdomen is soft.     Tenderness: There is no abdominal tenderness. There is no guarding.  Musculoskeletal:        General: No edema.     Cervical back: Normal range of motion and neck supple.  Skin:    General: Skin is warm and dry.     Capillary Refill: Capillary refill takes less than 2 seconds.  Neurological:     General: No focal deficit present.     Mental Status: She is alert and oriented to person, place, and time.     Sensory: No sensory deficit.     Motor: No weakness.  Psychiatric:        Mood and Affect: Mood and affect and mood normal.     ED Results / Procedures / Treatments   Labs (all labs ordered are listed, but only abnormal results are displayed) Labs Reviewed  CBC WITH DIFFERENTIAL/PLATELET - Abnormal; Notable for the  following components:      Result Value   RBC 3.48 (*)    Hemoglobin 10.7 (*)    HCT 35.9 (*)    MCV 103.2 (*)    MCHC 29.8 (*)    RDW 15.9 (*)    Platelets 135 (*)    nRBC 1.9 (*)    Monocytes Absolute 1.9 (*)    Abs Immature Granulocytes 0.75 (*)    All other components within normal limits  BASIC METABOLIC PANEL - Abnormal; Notable for the following components:   Glucose, Bld 113 (*)    Creatinine, Ser 1.99 (*)    Calcium >15.0 (*)    GFR, Estimated 24 (*)    All other components within normal limits  PROTIME-INR - Abnormal; Notable for the following components:   Prothrombin Time 30.6 (*)    INR 3.1 (*)    All other components within normal limits  LACTATE DEHYDROGENASE - Abnormal; Notable for the following components:   LDH 309 (*)    All other components within normal limits  LACTIC ACID, PLASMA - Abnormal; Notable for the following components:   Lactic Acid, Venous 2.0 (*)    All other components within normal limits  BRAIN NATRIURETIC PEPTIDE - Abnormal; Notable for the following components:   B Natriuretic Peptide 1,057.0 (*)    All other components within normal limits  HEPATIC FUNCTION PANEL - Abnormal; Notable for the following components:   Total Protein >12.0 (*)    Albumin 2.8 (*)    All other components within normal limits  CBG MONITORING, ED - Abnormal; Notable for the following components:  Glucose-Capillary 108 (*)    All other components within normal limits  TROPONIN I (HIGH SENSITIVITY) - Abnormal; Notable for the following components:   Troponin I (High Sensitivity) 30 (*)    All other components within normal limits  RESP PANEL BY RT-PCR (FLU A&B, COVID) ARPGX2  CULTURE, BLOOD (ROUTINE X 2)  CULTURE, BLOOD (ROUTINE X 2)  URINALYSIS, ROUTINE W REFLEX MICROSCOPIC  LACTIC ACID, PLASMA    EKG EKG Interpretation  Date/Time:  Friday August 25 2020 12:31:53 EST Ventricular Rate:  101 PR Interval:    QRS Duration: 99 QT Interval:  344 QTC  Calculation: 446 R Axis:   -65 Text Interpretation: Sinus tachycardia Prolonged PR interval Probable left atrial enlargement Left anterior fascicular block Left ventricular hypertrophy Nonspecific T abnormalities, lateral leads No significant change since last tracing Confirmed by Lennice Sites [phone]) on 08/28/2020 12:36:03 PM   Radiology CT Head Wo Contrast  Result Date: 08/31/2020 CLINICAL DATA:  Mental status changes, lethargy and weakness for 2 days; history of atrial fibrillation, CHF, hypertension, non ischemic cardiomyopathy, plasma cell disorder EXAM: CT HEAD WITHOUT CONTRAST TECHNIQUE: Contiguous axial images were obtained from the base of the skull through the vertex without intravenous contrast. Sagittal and coronal MPR images reconstructed from axial data set. COMPARISON:  At 08/08/2012 FINDINGS: Brain: Normal ventricular morphology. No midline shift or mass effect. Normal appearance of brain parenchyma. No intracranial hemorrhage, mass lesion or evidence of acute infarction. No extra-axial fluid collections. Vascular: No hyperdense vessels. Atherosclerotic calcification of internal carotid and vertebral arteries at skull base noted. Skull: Osseous demineralization. Multiple lytic foci are seen without surrounding sclerosis, highly suspicious for multiple myeloma in this patient with a history of a plasma cell disorder. Sinuses/Orbits: Clear Other: N/A IMPRESSION: No acute intracranial abnormalities. Multiple lytic foci throughout the skull base highly suspicious for multiple myeloma in this patient with a history of a plasma cell disorder; differential diagnosis includes lytic metastatic disease as well. Findings called to Dr. Ronnald Nian on 08/06/2020 at 1428 hours. Electronically Signed   By: Lavonia Dana M.D.   On: 08/07/2020 14:28   DG Chest Portable 1 View  Result Date: 08/28/2020 CLINICAL DATA:  Weakness, oxygen desaturation, less responsive, shortness of breath, lethargy EXAM:  PORTABLE CHEST 1 VIEW COMPARISON:  Portable exam 1245 hours compared to 08/21/2020 FINDINGS: LEFT subclavian ICD with leads projecting over RIGHT atrium and RIGHT ventricle. LVAD noted. Enlargement of cardiac silhouette with slight vascular congestion. Atherosclerotic calcification aorta. Low lung volumes with bibasilar atelectasis. Question minimal pulmonary edema. No pleural effusion or pneumothorax. Bones demineralized. IMPRESSION: Enlargement of cardiac silhouette with vascular congestion post ICD and LVAD. Bibasilar atelectasis and question minimal pulmonary edema. Electronically Signed   By: Lavonia Dana M.D.   On: 08/27/2020 12:55    Procedures Procedures (including critical care time)  Medications Ordered in ED Medications  0.9 %  sodium chloride infusion ( Intravenous New Bag/Given 08/09/2020 1445)    ED Course  I have reviewed the triage vital signs and the nursing notes.  Pertinent labs & imaging results that were available during my care of the patient were reviewed by me and considered in my medical decision making (see chart for details).    MDM Rules/Calculators/A&P                          Brianna Hanson is an 85 year old female with history of ICD/nonischemic cardiomyopathy now with LVAD who presents to the ED with generalized  weakness.  Overall unremarkable vitals.  Was may be hypoxic in triage but has had normal room air oxygenation upon my evaluation.  Generalized malaise and fatigue and poor p.o. intake for the last several days.  Was seen several days ago in the ED with unremarkable work-up.  Was started by cardiology team on doxycycline for possible lung infection.  She has chest pain and cough but no real sputum production.  Overall she appears lethargic but neurologically intact.  She is on Coumadin.  INR several days ago was 2.7.  Talked with LVAD coordinator on the phone and will check labs including chest x-ray, CT of the head and have her LVAD evaluated.  Family is not  concerned about any LVAD issues in particular.  She does appear dehydrated on exam but will hold off any hydration at this time until I have more information.  Denies any urinary symptoms.  Does not have fever.  Will check urinalysis and pursue infectious work-up.  Chest x-ray shows some pulmonary congestion possibly.  No real focal consolidation to suggest pneumonia.  Awaiting lab work.  EKG appeared to show sinus rhythm.  Will touch base with cardiology team after work-up is completed.  Patient found to have a calcium greater than 15, creatinine 1.99, otherwise unremarkable CBC with no significant anemia or leukocytosis. Troponin is 2.0. BNP is 1000, troponin is 30. Albumin is 2.8. Covid test and influenza test negative. Radiology called me on the phone to state that head CT shows multiple lytic foci which is highly suspicious for multiple myeloma. Upon chart review, patient did see oncology back in 2016 and was diagnosed with MGUS but had no evidence of multiple myeloma at this time. This is likely a new diagnosis given the clinical scenario. I updated the LVAD coordinated about this plan and heart failure team to admit the patient. I have reached out to oncology and awaiting any recommendations other than starting some IV fluids. Patient hemodynamically stable.  This chart was dictated using voice recognition software.  Despite best efforts to proofread,  errors can occur which can change the documentation meaning.    Final Clinical Impression(s) / ED Diagnoses Final diagnoses:  Altered mental status, unspecified altered mental status type  Hypercalcemia  AKI (acute kidney injury) [facility])    Rx / [place] Orders ED Discharge Orders    None       Lennice Sites, DO 08/24/2020 1532

## 2020-08-25 NOTE — ED Triage Notes (Signed)
Pt arrives to ED with family with c/o lethargy, fatigue, and SOB over the past two days. Per family pt has not gotten out of bed x2 days and today is not as responsive as normal. SpO2 86% in triage on RA. Pt immediately taken to room for assessment.

## 2020-08-26 ENCOUNTER — Inpatient Hospital Stay (HOSPITAL_COMMUNITY): Payer: Medicare Other

## 2020-08-26 DIAGNOSIS — I10 Essential (primary) hypertension: Secondary | ICD-10-CM

## 2020-08-26 DIAGNOSIS — N179 Acute kidney failure, unspecified: Secondary | ICD-10-CM

## 2020-08-26 LAB — BASIC METABOLIC PANEL
Anion gap: 12 (ref 5–15)
BUN: 28 mg/dL — ABNORMAL HIGH (ref 8–23)
CO2: 21 mmol/L — ABNORMAL LOW (ref 22–32)
Calcium: 11.2 mg/dL — ABNORMAL HIGH (ref 8.9–10.3)
Chloride: 110 mmol/L (ref 98–111)
Creatinine, Ser: 1.87 mg/dL — ABNORMAL HIGH (ref 0.44–1.00)
GFR, Estimated: 26 mL/min — ABNORMAL LOW (ref >60)
Glucose, Bld: 154 mg/dL — ABNORMAL HIGH (ref 70–99)
Potassium: 4 mmol/L (ref 3.5–5.1)
Sodium: 143 mmol/L (ref 135–145)

## 2020-08-26 LAB — URINALYSIS, ROUTINE W REFLEX MICROSCOPIC
Bilirubin Urine: NEGATIVE
Glucose, UA: NEGATIVE mg/dL
Ketones, ur: NEGATIVE mg/dL
Leukocytes,Ua: NEGATIVE
Nitrite: NEGATIVE
Protein, ur: 30 mg/dL — AB
Specific Gravity, Urine: 1.015 (ref 1.005–1.030)
pH: 5 (ref 5.0–8.0)

## 2020-08-26 LAB — CBC
HCT: 31.2 % — ABNORMAL LOW (ref 36.0–46.0)
Hemoglobin: 9.5 g/dL — ABNORMAL LOW (ref 12.0–15.0)
MCH: 30.8 pg (ref 26.0–34.0)
MCHC: 30.4 g/dL (ref 30.0–36.0)
MCV: 101.3 fL — ABNORMAL HIGH (ref 80.0–100.0)
Platelets: 122 10*3/uL — ABNORMAL LOW (ref 150–400)
RBC: 3.08 MIL/uL — ABNORMAL LOW (ref 3.87–5.11)
RDW: 16 % — ABNORMAL HIGH (ref 11.5–15.5)
WBC: 9.4 10*3/uL (ref 4.0–10.5)
nRBC: 1 % — ABNORMAL HIGH (ref 0.0–0.2)

## 2020-08-26 LAB — COMPREHENSIVE METABOLIC PANEL
ALT: 21 U/L (ref 0–44)
AST: 31 U/L (ref 15–41)
Albumin: 2.5 g/dL — ABNORMAL LOW (ref 3.5–5.0)
Alkaline Phosphatase: 94 U/L (ref 38–126)
Anion gap: 12 (ref 5–15)
BUN: 22 mg/dL (ref 8–23)
CO2: 23 mmol/L (ref 22–32)
Calcium: 14.4 mg/dL (ref 8.9–10.3)
Chloride: 108 mmol/L (ref 98–111)
Creatinine, Ser: 2.09 mg/dL — ABNORMAL HIGH (ref 0.44–1.00)
GFR, Estimated: 23 mL/min — ABNORMAL LOW (ref >60)
Glucose, Bld: 143 mg/dL — ABNORMAL HIGH (ref 70–99)
Potassium: 4.3 mmol/L (ref 3.5–5.1)
Sodium: 143 mmol/L (ref 135–145)
Total Bilirubin: 1.2 mg/dL (ref 0.3–1.2)
Total Protein: 11.5 g/dL — ABNORMAL HIGH (ref 6.5–8.1)

## 2020-08-26 LAB — PROTIME-INR
INR: 3.4 — ABNORMAL HIGH (ref 0.8–1.2)
Prothrombin Time: 32.9 seconds — ABNORMAL HIGH (ref 11.4–15.2)

## 2020-08-26 LAB — GLUCOSE, CAPILLARY: Glucose-Capillary: 144 mg/dL — ABNORMAL HIGH (ref 70–99)

## 2020-08-26 LAB — MRSA PCR SCREENING: MRSA by PCR: NEGATIVE

## 2020-08-26 LAB — LACTATE DEHYDROGENASE: LDH: 246 U/L — ABNORMAL HIGH (ref 98–192)

## 2020-08-26 MED ORDER — DEXAMETHASONE SODIUM PHOSPHATE 10 MG/ML IJ SOLN
10.0000 mg | Freq: Four times a day (QID) | INTRAMUSCULAR | Status: DC
  Administered 2020-08-26 – 2020-08-27 (×4): 10 mg via INTRAVENOUS
  Filled 2020-08-26 (×5): qty 1

## 2020-08-26 MED ORDER — FUROSEMIDE 10 MG/ML IJ SOLN
40.0000 mg | Freq: Every day | INTRAMUSCULAR | Status: DC
  Administered 2020-08-26 – 2020-08-27 (×2): 40 mg via INTRAVENOUS
  Filled 2020-08-26 (×2): qty 4

## 2020-08-26 MED ORDER — SODIUM CHLORIDE 0.9 % IV BOLUS
1000.0000 mL | Freq: Once | INTRAVENOUS | Status: AC
  Administered 2020-08-26: 11:00:00 1000 mL via INTRAVENOUS

## 2020-08-26 MED ORDER — FUROSEMIDE 10 MG/ML IJ SOLN
INTRAMUSCULAR | Status: AC
  Filled 2020-08-26: qty 4

## 2020-08-26 MED ORDER — FUROSEMIDE 10 MG/ML IJ SOLN
40.0000 mg | Freq: Once | INTRAMUSCULAR | Status: AC
  Administered 2020-08-26: 20:00:00 40 mg via INTRAVENOUS

## 2020-08-26 NOTE — Plan of Care (Signed)

## 2020-08-26 NOTE — Plan of Care (Signed)
?  Problem: Education: ?Goal: Knowledge of General Education information will improve ?Description: Including pain rating scale, medication(s)/side effects and non-pharmacologic comfort measures ?Outcome: Progressing ?  ?Problem: Health Behavior/Discharge Planning: ?Goal: Ability to manage health-related needs will improve ?Outcome: Progressing ?  ?Problem: Clinical Measurements: ?Goal: Ability to maintain clinical measurements within normal limits will improve ?Outcome: Progressing ?Goal: Will remain free from infection ?Outcome: Progressing ?Goal: Diagnostic test results will improve ?Outcome: Progressing ?Goal: Respiratory complications will improve ?Outcome: Progressing ?Goal: Cardiovascular complication will be avoided ?Outcome: Progressing ?  ?Problem: Activity: ?Goal: Risk for activity intolerance will decrease ?Outcome: Progressing ?  ?Problem: Safety: ?Goal: Ability to remain free from injury will improve ?Outcome: Progressing ?  ?

## 2020-08-26 NOTE — Progress Notes (Signed)
°   08/02/2020 2346  Assess: MEWS Score  Temp 98.5 F (36.9 C)  BP 119/83  Pulse Rate 97  ECG Heart Rate 99  Resp (!) 23  Level of Consciousness Responds to Voice  SpO2 98 %  O2 Device Room Air  Patient Activity (if Appropriate) In bed  Assess: MEWS Score  MEWS Temp 0  MEWS Systolic 0  MEWS Pulse 0  MEWS RR 1  MEWS LOC 1  MEWS Score 2  MEWS Score Color Yellow  Assess: if the MEWS score is Yellow or Red  Were vital signs taken at a resting state? Yes  Focused Assessment No change from prior assessment  Early Detection of Sepsis Score *See Row Information* Low  MEWS guidelines implemented *See Row Information* Yes  Treat  MEWS Interventions Escalated (See documentation below)  Pain Scale 0-10  Pain Score 0  Take Vital Signs  Increase Vital Sign Frequency  Yellow: Q 2hr X 2 then Q 4hr X 2, if remains yellow, continue Q 4hrs  Escalate  MEWS: Escalate Yellow: discuss with charge nurse/RN and consider discussing with provider and RRT  Notify: Charge Nurse/RN  Name of Charge Nurse/RN Notified April Cooper, RN  Date Charge Nurse/RN Notified 08/14/2020  Time Charge Nurse/RN Notified 2346  Document  Patient Outcome Other (Comment) (on unit)  Progress note created (see row info) Yes

## 2020-08-26 NOTE — Consult Note (Signed)
[facility] CONSULT NOTE  Patient Care Team: [name], [name], MD as PCP - General (Cardiology) [name], [name], MD as Consulting Physician (Cardiology) Hillis Range, MD as Consulting Physician (Cardiology) Marcy Siren, LCSW as Social Worker (Licensed Clinical Social Worker)  ASSESSMENT & PLAN:  Multiple myeloma causing hypercalcemia until proven otherwise She was seen by Dr. Clelia Croft in the past for IgA kappa MGUS Her last appointment with Dr. Clelia Croft in 2016; since then, she had multiple no-shows and was lost to follow-up Over the past year, she had numerous outpatient follow-up for compression fractures and was told she probably has osteoporosis Starting around January 2020, she is noted to have paraproteinemia with elevated total protein and reduced albumin. On August 21, 2020, she presented to the emergency room with chest pain and cough. Her blood work on August 21, 2020 showed mild acute renal failure, hyperkalemia as well as hypercalcemia Yesterday, she has severe hypercalcemia with signs of acute renal failure Myeloma panel has been drawn, pending Skeletal survey from today confirmed numerous lytic lesions consistent with multiple myeloma She would need bone marrow aspirate and biopsy prior to treatment, will order for next week Continue aggressive supportive care and management of hypercalcemia and acute renal failure in the meantime  Multiple compression fractures, likely due to multiple myeloma The first documented compression fracture was in December 2020 She was managed by osteoporosis medicines She developed compression fractures again in April 2021 and underwent vertebroplasty.   Biopsies were not done. I recommend round-the-clock steroid treatment for pain control I will draw serum vitamin D level She has received 1 dose of zometa X-ray is reviewed by myself which showed generalized osteopenia with multiple large lytic lesions on the  right humerus  Severe hypercalcemia of malignancy causing altered mental status I discussed the plan of care with primary service Dr. Gala Romney will order aggressive IV fluid hydration followed by forced diuresis  Acute renal failure Likely due to malignancy If it does not improve, might have to get nephrology involved  Acquired pancytopenia Likely due to bone marrow infiltration Will order additional work-up for this  Chronic congestive heart failure Would defer to primary service for management  Goals of care discussion Multiple myeloma is treatable but noncurable It is not clear to me how aggressive we can approach this given her other comorbidities and her significant decline; after discussion with Dr. Gala Romney, he felt that the patient would benefit from aggressive management as her performance status was reasonably good a few months prior to this diagnosis I am not able to reach her significant other As discussed with primary service, we will manage her aggressively with goals to get to chemotherapy in the near future  I will see her again tomorrow  Artis Delay, MD 12/25/20218:47 AM  CHIEF COMPLAINTS/PURPOSE OF CONSULTATION:  Hypercalcemia, acute renal failure, likely due to multiple myeloma  HISTORY OF PRESENTING ILLNESS:  Brianna Hanson 85 y.o. female is seen at the request from emergency room physicians for evaluation and management for patient with severe hypercalcemia, paraproteinemia, acute renal failure, likely due to newly diagnosed multiple myeloma She was last seen by Dr. Clelia Croft in 2016.  Since then, she was lost to follow-up I am not able to get much history from the patient She appears lethargic although she answers simple questions and open her eyes intermittently She has IgA kappa MGUS, last panel was drawn in 2016 Repeat myeloma panel is pending On review of her blood work and imaging studies with  the past year, it appears that she has been complaining of  back pain, new onset of paraproteinemia as well as multiple compression fractures being managed conservatively by her primary care doctor as well as interventional radiologist for vertebroplasty.  Prior to current admission, she went to the ER again on August 21, 2020 complaining of chest pain and was subsequently discharged.  Malignant hypercalcemia was noted on the blood draw.  Yesterday, she was subsequently admitted for lethargy, weakness as well as altered mental status. This morning, she went for skeletal survey which showed diffuse lytic lesions Repeat blood draw showed persistent hypercalcemia, along with renal failure, pancytopenia, elevated cardiac biomarkers  I was not able to reach out to significant other According to Dr. Gala Romney, her performance status was reasonably good a few months prior to this  MEDICAL HISTORY:  Past Medical History:  Diagnosis Date  . AICD (automatic cardioverter/defibrillator) present   . Anemia   . Atrial fibrillation or flutter   . Cardiac arrest - ventricular fibrillation 12/10   with successful resucitation, S/p ICD  . CHF (congestive heart failure) (HCC)   . Diverticula of colon 2011  . HTN (hypertension)    moderate  . ICD (implantable cardiac defibrillator) in place    she has received appropriate therapy for VF  . Internal and external hemorrhoids without complication 2011  . Nonischemic cardiomyopathy (HCC)    followed by Dr Glori Luis at [facility]  . Osteopenia   . Plasma cell disorder 03/20/2012  . Plasma cell disorder 03/20/2012  . Sessile colonic polyp 2011   Dr Elnoria Howard    SURGICAL HISTORY: Past Surgical History:  Procedure Laterality Date  . ABDOMINAL HYSTERECTOMY    . CARDIAC CATHETERIZATION    . CARDIAC DEFIBRILLATOR PLACEMENT     by Fawn Kirk for secondary prevention of sudden death  . CARDIOVERSION N/A 2019-07-05   Procedure: CARDIOVERSION;  Surgeon: Dolores Patty, MD;  Location: [facility] ENDOSCOPY;  Service: Cardiovascular;   Laterality: N/A;  . CHOLECYSTECTOMY N/A 10/09/2015   Procedure: LAPAROSCOPIC CHOLECYSTECTOMY;  Surgeon: Emelia Loron, MD;  Location: [facility] OR;  Service: General;  Laterality: N/A;  . COLONOSCOPY W/ POLYPECTOMY  2011   Dr Elnoria Howard  . INSERTION OF IMPLANTABLE LEFT VENTRICULAR ASSIST DEVICE N/A 04/06/2013   Procedure: INSERTION OF IMPLANTABLE LEFT VENTRICULAR ASSIST DEVICE;  Surgeon: Alleen Borne, MD;  Location: [facility] OR;  Service: Open Heart Surgery;  Laterality: N/A;  . INTRAOPERATIVE TRANSESOPHAGEAL ECHOCARDIOGRAM N/A 04/06/2013   Procedure: INTRAOPERATIVE TRANSESOPHAGEAL ECHOCARDIOGRAM;  Surgeon: Alleen Borne, MD;  Location: [facility] OR;  Service: Open Heart Surgery;  Laterality: N/A;  . IR GENERIC HISTORICAL  09/24/2016   IR US GUIDE VASC ACCESS RIGHT 09/24/2016 Berdine Dance, MD [facility]-INTER[facility] RAD  . IR GENERIC HISTORICAL  09/24/2016   IR ANGIOGRAM VISCERAL SELECTIVE 09/24/2016 Berdine Dance, MD [facility]  . IR GENERIC HISTORICAL  09/24/2016   IR ANGIOGRAM VISCERAL SELECTIVE 09/24/2016 Berdine Dance, MD [facility]  . IR VERTEBROPLASTY EA ADDL (T&LS) BX INC UNI/BIL INC INJECT/IMAGING  12/29/2019  . IR VERTEBROPLASTY EA ADDL (T&LS) BX INC UNI/BIL INC INJECT/IMAGING  12/29/2019  . IR VERTEBROPLASTY EA ADDL (T&LS) BX INC UNI/BIL INC INJECT/IMAGING  12/29/2019  . IR VERTEBROPLASTY LUMBAR BX INC UNI/BIL INC/INJECT/IMAGING  12/29/2019  . PLACEMENT OF CENTRIMAG VENTRICULAR ASSIST DEVICE Right 04/06/2013   Procedure: PLACEMENT OF CENTRIMAG VENTRICULAR ASSIST DEVICE;  Surgeon: Alleen Borne, MD;  Location: [facility] OR;  Service: Open Heart Surgery;  Laterality: Right;  . RADIOLOGY WITH ANESTHESIA N/A 12/29/2019  Procedure: KYPHOPLASTY;  Surgeon: Luanne Bras, MD;  Location: [place];  Service: Radiology;  Laterality: N/A;    SOCIAL HISTORY: Social History   Socioeconomic History  . Marital status: Soil scientist    Spouse name: Not on file  . Number of children: 3  . Years of education: Not on file  . Highest  education level: Not on file  Occupational History  . Not on file  Tobacco Use  . Smoking status: Never Smoker  . Smokeless tobacco: Never Used  . Tobacco comment: remote history of tobacco abuse  Vaping Use  . Vaping Use: Never used  Substance and Sexual Activity  . Alcohol use: Yes    Comment: occsionally wine  . Drug use: No  . Sexual activity: Not Currently    Birth control/protection: Surgical  Other Topics Concern  . Not on file  Social History Narrative   Lives with spouse who was recently diagnosed with esophageal ca and is receiving chemotherapy   Social Determinants of Health   Financial Resource Strain: Not on file  Food Insecurity: Not on file  Transportation Needs: Not on file  Physical Activity: Not on file  Stress: Not on file  Social Connections: Not on file  Intimate Partner Violence: Not on file    FAMILY HISTORY: Family History  Problem Relation Age of Onset  . Stroke Mother   . Stroke Brother     ALLERGIES:  is allergic to oxycodone.  MEDICATIONS:  Current Facility-Administered Medications  Medication Dose Route Frequency Provider Last Rate Last Admin  . 0.9 %  sodium chloride infusion   Intravenous Continuous [name], Shaune Pascal, MD 150 mL/hr at 08/26/20 0353 New Bag at 08/26/20 0353  . acetaminophen (TYLENOL) tablet 650 mg  650 mg Oral Q4H PRN [name], Shaune Pascal, MD      . amiodarone (PACERONE) tablet 200 mg  200 mg Oral Daily [name], Shaune Pascal, MD   200 mg at 08/10/2020 1902  . calcitonin (MIACALCIN) injection 300 Units  4 Units/kg Subcutaneous BID [name], Shaune Pascal, MD   300 Units at 08/28/2020 2127  . docusate sodium (COLACE) capsule 100 mg  100 mg Oral Daily [name], Shaune Pascal, MD      . hydrALAZINE (APRESOLINE) tablet 50 mg  50 mg Oral TID [name], Shaune Pascal, MD   50 mg at 08/26/20 0042  . ondansetron (ZOFRAN) injection 4 mg  4 mg Intravenous Q6H PRN [name], Shaune Pascal, MD      . pantoprazole (PROTONIX) EC tablet 40 mg  40 mg Oral Daily  [name], Shaune Pascal, MD   40 mg at 08/15/2020 1902    REVIEW OF SYSTEMS: Unable to obtain  PHYSICAL EXAMINATION: ECOG PERFORMANCE STATUS: 4 - Bedbound  Vitals:   08/26/20 0200 08/26/20 0344  BP: 112/86 102/78  Pulse:  83  Resp: (!) 21 (!) 22  Temp:  97.9 F (36.6 C)  SpO2:  96%   Filed Weights   08/14/2020 1309 08/09/2020 2346 08/26/20 0344  Weight: 165 lb (74.8 kg) 158 lb 8.2 oz (71.9 kg) 153 lb 14.1 oz (69.8 kg)    GENERAL:alert, no distress and comfortable.  She opens her eyes and answer questions intermittently SKIN: skin color, texture, turgor are normal, no rashes or significant lesions EYES: normal, conjunctiva are pale and non-injected, sclera clear OROPHARYNX: ednetulous NECK: supple, thyroid normal size, non-tender, without nodularity LYMPH:  no palpable lymphadenopathy in the cervical, axillary or inguinal LUNGS: clear to auscultation and percussion with normal breathing effort HEART:  regular rate & rhythm with audible murmur ABDOMEN:abdomen soft, non-tender and normal bowel sounds Musculoskeletal:no cyanosis of digits and no clubbing  PSYCH: alert intermittently but appears lethargic  LABORATORY DATA:  I have reviewed the data as listed Lab Results  Component Value Date   WBC 9.4 08/26/2020   HGB 9.5 (L) 08/26/2020   HCT 31.2 (L) 08/26/2020   MCV 101.3 (H) 08/26/2020   PLT 122 (L) 08/26/2020   Recent Labs    01/07/20 0301 02/17/20 0956 04/13/20 1013 06/08/20 1021 08/21/20 1551 08/18/2020 1245 08/12/2020 1300 08/26/20 0035  NA 139 138 139   < > 136  --  142 143  K 4.2 4.5 4.3   < > 5.7*  --  4.3 4.3  CL 104 108 106   < > 104  --  108 108  CO2 $Re'26 22 22   'nyy$ < > 22  --  22 23  GLUCOSE 102* 92 99   < > 102*  --  113* 143*  BUN $Re'9 13 18   'BkZ$ < > 17  --  20 22  CREATININE 0.81 0.91 1.09*   < > 1.32*  --  1.99* 2.09*  CALCIUM 9.2 9.5 9.6   < > 11.2*  --  >15.0* 14.4*  GFRNONAA >60 58* 47*   < > 40*  --  24* 23*  GFRAA >60 >60 54*  --   --   --   --   --   PROT  --    --  9.7*  --   --  >12.0*  --  11.5*  ALBUMIN  --   --  3.3*  --   --  2.8*  --  2.5*  AST  --   --  25  --   --  36  --  31  ALT  --   --  21  --   --  23  --  21  ALKPHOS  --   --  93  --   --  105  --  94  BILITOT  --   --  0.6  --   --  1.1  --  1.2  BILIDIR  --   --   --   --   --  0.2  --   --   IBILI  --   --   --   --   --  0.9  --   --    < > = values in this interval not displayed.    RADIOGRAPHIC STUDIES: I have personally reviewed the radiological images as listed and agreed with the findings in the report. CT Head Wo Contrast  Result Date: 08/15/2020 CLINICAL DATA:  Mental status changes, lethargy and weakness for 2 days; history of atrial fibrillation, CHF, hypertension, non ischemic cardiomyopathy, plasma cell disorder EXAM: CT HEAD WITHOUT CONTRAST TECHNIQUE: Contiguous axial images were obtained from the base of the skull through the vertex without intravenous contrast. Sagittal and coronal MPR images reconstructed from axial data set. COMPARISON:  At 08/08/2012 FINDINGS: Brain: Normal ventricular morphology. No midline shift or mass effect. Normal appearance of brain parenchyma. No intracranial hemorrhage, mass lesion or evidence of acute infarction. No extra-axial fluid collections. Vascular: No hyperdense vessels. Atherosclerotic calcification of internal carotid and vertebral arteries at skull base noted. Skull: Osseous demineralization. Multiple lytic foci are seen without surrounding sclerosis, highly suspicious for multiple myeloma in this patient with a history of a plasma cell disorder. Sinuses/Orbits: Clear Other: N/A  IMPRESSION: No acute intracranial abnormalities. Multiple lytic foci throughout the skull base highly suspicious for multiple myeloma in this patient with a history of a plasma cell disorder; differential diagnosis includes lytic metastatic disease as well. Findings called to Dr. Ronnald Nian on 08/31/2020 at 1428 hours. Electronically Signed   By: Lavonia Dana  M.D.   On: 08/16/2020 14:28   DG Chest Portable 1 View  Result Date: 08/10/2020 CLINICAL DATA:  Weakness, oxygen desaturation, less responsive, shortness of breath, lethargy EXAM: PORTABLE CHEST 1 VIEW COMPARISON:  Portable exam 1245 hours compared to 08/21/2020 FINDINGS: LEFT subclavian ICD with leads projecting over RIGHT atrium and RIGHT ventricle. LVAD noted. Enlargement of cardiac silhouette with slight vascular congestion. Atherosclerotic calcification aorta. Low lung volumes with bibasilar atelectasis. Question minimal pulmonary edema. No pleural effusion or pneumothorax. Bones demineralized. IMPRESSION: Enlargement of cardiac silhouette with vascular congestion post ICD and LVAD. Bibasilar atelectasis and question minimal pulmonary edema. Electronically Signed   By: Lavonia Dana M.D.   On: 08/06/2020 12:55   DG Chest Portable 1 View  Result Date: 08/21/2020 CLINICAL DATA:  85 year old female with chest pain. EXAM: PORTABLE CHEST 1 VIEW COMPARISON:  Chest radiograph dated 12/22/2019. FINDINGS: There are bibasilar atelectasis/scarring. No focal consolidation, pleural effusion or pneumothorax. Stable cardiomegaly. Median sternotomy wires and left pectoral AICD device. Partially visualized LVAD. Atherosclerotic calcification of the aorta. No acute osseous pathology. IMPRESSION: No active disease.  No interval change. Electronically Signed   By: Anner Crete M.D.   On: 08/21/2020 15:19

## 2020-08-26 NOTE — Progress Notes (Signed)
Advanced Heart Failure VAD Team Note  PCP-Cardiologist: No primary care provider on file.   Subjective:    Remains lethargic but will arouse.   Ca remains elevated at 14.4. Tolerating IVF at 150/hr  Denies SOB, orthopnea or PND. Creatinine up slightly  Skeletal survey 12/25: Widespread myeloma lytic lesions and generalized osteopenia.   LVAD INTERROGATION:  HeartMate II LVAD:   Flow 4.3 liters/min, speed 8600, power 5.0, PI 6.5.   VAD interrogated personally. Parameters stable.  Objective:    Vital Signs:   Temp:  [97.9 F (36.6 C)-99.8 F (37.7 C)] 97.9 F (36.6 C) (12/25 0344) Pulse Rate:  [74-104] 83 (12/25 0344) Resp:  [12-35] 22 (12/25 0344) BP: (91-131)/(66-89) 107/74 (12/25 0800) SpO2:  [86 %-100 %] 96 % (12/25 0344) Weight:  [69.8 kg-74.8 kg] 69.8 kg (12/25 0344)   Mean arterial Pressure 105   Intake/Output:   Intake/Output Summary (Last 24 hours) at 08/26/2020 0957 Last data filed at 08/26/2020 0400 Gross per 24 hour  Intake 1484.86 ml  Output 300 ml  Net 1184.86 ml     Physical Exam    General:  Weak appearing.Will arouse and interact then falls back to sleep  No resp difficulty HEENT: normal Neck: supple. JVP . Carotids 2+ bilat; no bruits. No lymphadenopathy or thyromegaly appreciated. Cor: Mechanical heart sounds with LVAD hum present. Lungs: clear Abdomen: soft, nontender, nondistended. No hepatosplenomegaly. No bruits or masses. Good bowel sounds. Driveline: C/D/I; securement device intact and driveline incorporated Extremities: no cyanosis, clubbing, rash, edema Neuro: Will arouse and interact then falls back to sleep    Telemetry   NSR 80s Personally reviewed   Labs   Basic Metabolic Panel: Recent Labs  Lab 08/21/20 1551 08/02/2020 1300 08/26/20 0035  NA 136 142 143  K 5.7* 4.3 4.3  CL 104 108 108  CO2 $Re'22 22 23  'hhB$ GLUCOSE 102* 113* 143*  BUN $Re'17 20 22  'WcX$ CREATININE 1.32* 1.99* 2.09*  CALCIUM 11.2* >15.0* 14.4*    Liver  Function Tests: Recent Labs  Lab 08/26/2020 1245 08/26/20 0035  AST 36 31  ALT 23 21  ALKPHOS 105 94  BILITOT 1.1 1.2  PROT >12.0* 11.5*  ALBUMIN 2.8* 2.5*   No results for input(s): LIPASE, AMYLASE in the last 168 hours. No results for input(s): AMMONIA in the last 168 hours.  CBC: Recent Labs  Lab 08/21/20 1551 08/20/2020 1300 08/26/20 0035  WBC 9.1 10.5 9.4  NEUTROABS  --  6.0  --   HGB 10.5* 10.7* 9.5*  HCT 35.2* 35.9* 31.2*  MCV 102.6* 103.2* 101.3*  PLT 177 135* 122*    INR: Recent Labs  Lab 08/21/20 1651 08/22/20 0000 08/19/2020 1300 08/26/20 0035  INR 2.6* 2.4 3.1* 3.4*    Other results:  EKG:    Imaging   CT Head Wo Contrast  Result Date: 09/01/2020 CLINICAL DATA:  Mental status changes, lethargy and weakness for 2 days; history of atrial fibrillation, CHF, hypertension, non ischemic cardiomyopathy, plasma cell disorder EXAM: CT HEAD WITHOUT CONTRAST TECHNIQUE: Contiguous axial images were obtained from the base of the skull through the vertex without intravenous contrast. Sagittal and coronal MPR images reconstructed from axial data set. COMPARISON:  At 08/08/2012 FINDINGS: Brain: Normal ventricular morphology. No midline shift or mass effect. Normal appearance of brain parenchyma. No intracranial hemorrhage, mass lesion or evidence of acute infarction. No extra-axial fluid collections. Vascular: No hyperdense vessels. Atherosclerotic calcification of internal carotid and vertebral arteries at skull base noted. Skull: Osseous demineralization.  Multiple lytic foci are seen without surrounding sclerosis, highly suspicious for multiple myeloma in this patient with a history of a plasma cell disorder. Sinuses/Orbits: Clear Other: N/A IMPRESSION: No acute intracranial abnormalities. Multiple lytic foci throughout the skull base highly suspicious for multiple myeloma in this patient with a history of a plasma cell disorder; differential diagnosis includes lytic  metastatic disease as well. Findings called to Dr. Ronnald Nian on 08/02/2020 at 1428 hours. Electronically Signed   By: Lavonia Dana M.D.   On: 08/06/2020 14:28   DG Chest Portable 1 View  Result Date: 08/23/2020 CLINICAL DATA:  Weakness, oxygen desaturation, less responsive, shortness of breath, lethargy EXAM: PORTABLE CHEST 1 VIEW COMPARISON:  Portable exam 1245 hours compared to 08/21/2020 FINDINGS: LEFT subclavian ICD with leads projecting over RIGHT atrium and RIGHT ventricle. LVAD noted. Enlargement of cardiac silhouette with slight vascular congestion. Atherosclerotic calcification aorta. Low lung volumes with bibasilar atelectasis. Question minimal pulmonary edema. No pleural effusion or pneumothorax. Bones demineralized. IMPRESSION: Enlargement of cardiac silhouette with vascular congestion post ICD and LVAD. Bibasilar atelectasis and question minimal pulmonary edema. Electronically Signed   By: Lavonia Dana M.D.   On: 08/24/2020 12:55   DG Bone Survey Met  Result Date: 08/26/2020 CLINICAL DATA:  Myeloma EXAM: METASTATIC BONE SURVEY COMPARISON:  Abdomen and pelvis CT 12/26/2019 FINDINGS: Skull: Innumerable lucencies throughout the calvarium. Upper extremities: Large lytic lesion at the right scapula near the neck and at the base of the scapular spine. At least 3 lytic lesions are seen in the right humeral shaft. Spine: Compression fractures have occurred at T10, T12, L1, L2, L3, L4, and L5. Bone augmentation at T10, T12, L3, and L4. Compression fractures have occurred at L2 and L5 since April 2021 CT. There is a lytic appearance to the T11 body which is attributed to myeloma. Myeloma deposit in the L5 spinous process. Lower extremities: Heterogeneous density in the bilateral femoral diaphyses likely reflecting myeloma. Prominent osteopenia diffusely. Heterogeneous trabeculated in the upper tibia on both sides which is symmetric and indeterminate. No femoral neck fracture or discrete femoral neck  erosion. Pelvis: Symmetric osteopenia.  No discrete lesion is seen. Chest and ribs: Cardiomegaly with dual-chamber pacer implant and left ventricular assist device. No pulmonary edema, effusion, or pneumothorax. No resolved rib lesions. IMPRESSION: 1. Widespread myeloma and generalized osteopenia. 2. Compression fractures of T10, T12, and L1-L5. L2 and L5 compression fractures are new from April 2021. Electronically Signed   By: Monte Fantasia M.D.   On: 08/26/2020 09:04      Medications:     Scheduled Medications: . amiodarone  200 mg Oral Daily  . calcitonin  4 Units/kg Subcutaneous BID  . docusate sodium  100 mg Oral Daily  . furosemide  40 mg Intravenous Daily  . hydrALAZINE  50 mg Oral TID  . pantoprazole  40 mg Oral Daily     Infusions: . sodium chloride 150 mL/hr at 08/26/20 0353  . sodium chloride       PRN Medications:  acetaminophen, ondansetron (ZOFRAN) IV   Assessment/Plan:    1. Hypercalcemia, hyperproteinemia and lytic skull lesions in setting of known MGUS -> multiple myeloma - Skeletal survey 12/25: Widespread myeloma lytic lesions and generalized osteopenia. - almost certainly has conversion to multiple myeloma. I d/w Oncology this am and will plan BMBx once hypercalcemia improved. Will likely be a candidate for myoclonal ab treatment - Increase IVF and add lasix - continue calcitonin 4 IUs/kg. Received zoledronic acid $RemoveBeforeD'4mg'[id number]$  on 12/24 -  Decadron per Oncology - SPEP/UPEP, serum free light chains, skeletal survey and beta-2 microglobuilin pending - Plan BMBx when hypercalcemia improved  2. 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 Chronic systolic GY:FVCB, s/p ICD and LVAD for DT(04/2013).  - now > 7 years out on VAD support - stable. Watch volume status with hydration  4.  LVAD placed for DT 04/2013:  -VAD interrogated personally. Parameters stable. - No ASA with GI bleed and intolerance.  - INR 3.1-> 3.4 Hold coumadin for possible  BMBx - INR Goal 1.8-2.3   5. PAF:  - Did not tolerate AF well at all.  - S/p DC-CV 06/18/19 -Remainsin NSR on  amiodarone 200 daily.  6.  HTN: - Off losartan due to AKI. MAPs slightly elevated.  - can increase hydralazine as needed  I reviewed the LVAD parameters from today, and compared the results to the patient's prior recorded data.  No programming changes were made.  The LVAD is functioning within specified parameters.  The patient performs LVAD self-test daily.  LVAD interrogation was negative for any significant power changes, alarms or PI events/speed drops.  LVAD equipment check completed and is in good working order.  Back-up equipment present.   LVAD education done on emergency procedures and precautions and reviewed exit site care.  Length of Stay: 1  Glori Bickers, MD 08/26/2020, 9:57 AM  VAD Team --- VAD ISSUES ONLY--- Pager [phone] (7am - 7am)  Advanced Heart Failure Team  Pager [phone] (M-F; 7a - 4p)  Please contact [facility] for night-coverage after hours (4p -7a ) and weekends on [email or web address]

## 2020-08-26 NOTE — Progress Notes (Signed)
CRITICAL VALUE ALERT  Critical Value: Calcium: 14.4  Date & Time Notied:  08/26/2020 @0445   Provider Notified: Dr. Vickki Muff  Orders Received/Actions taken: No new orders received.

## 2020-08-27 ENCOUNTER — Inpatient Hospital Stay (HOSPITAL_COMMUNITY): Payer: Medicare Other

## 2020-08-27 ENCOUNTER — Encounter (HOSPITAL_COMMUNITY): Payer: Self-pay | Admitting: Internal Medicine

## 2020-08-27 LAB — COMPREHENSIVE METABOLIC PANEL
ALT: 20 U/L (ref 0–44)
AST: 28 U/L (ref 15–41)
Albumin: 2.4 g/dL — ABNORMAL LOW (ref 3.5–5.0)
Alkaline Phosphatase: 85 U/L (ref 38–126)
Anion gap: 13 (ref 5–15)
BUN: 28 mg/dL — ABNORMAL HIGH (ref 8–23)
CO2: 22 mmol/L (ref 22–32)
Calcium: 11 mg/dL — ABNORMAL HIGH (ref 8.9–10.3)
Chloride: 109 mmol/L (ref 98–111)
Creatinine, Ser: 2.08 mg/dL — ABNORMAL HIGH (ref 0.44–1.00)
GFR, Estimated: 23 mL/min — ABNORMAL LOW (ref >60)
Glucose, Bld: 123 mg/dL — ABNORMAL HIGH (ref 70–99)
Potassium: 3 mmol/L — ABNORMAL LOW (ref 3.5–5.1)
Sodium: 144 mmol/L (ref 135–145)
Total Bilirubin: 1.1 mg/dL (ref 0.3–1.2)
Total Protein: 10.9 g/dL — ABNORMAL HIGH (ref 6.5–8.1)

## 2020-08-27 LAB — BLOOD GAS, ARTERIAL
Acid-base deficit: 1.5 mmol/L (ref 0.0–2.0)
Bicarbonate: 21.5 mmol/L (ref 20.0–28.0)
Drawn by: 12971
FIO2: 48
O2 Saturation: 98.7 %
Patient temperature: 37
pCO2 arterial: 29.1 mmHg — ABNORMAL LOW (ref 32.0–48.0)
pH, Arterial: 7.482 — ABNORMAL HIGH (ref 7.350–7.450)
pO2, Arterial: 141 mmHg — ABNORMAL HIGH (ref 83.0–108.0)

## 2020-08-27 LAB — COOXEMETRY PANEL
Carboxyhemoglobin: 0.7 % (ref 0.5–1.5)
Methemoglobin: 0.9 % (ref 0.0–1.5)
O2 Saturation: 49.6 %
Total hemoglobin: 10 g/dL — ABNORMAL LOW (ref 12.0–16.0)

## 2020-08-27 LAB — CBC
HCT: 29.9 % — ABNORMAL LOW (ref 36.0–46.0)
Hemoglobin: 9.4 g/dL — ABNORMAL LOW (ref 12.0–15.0)
MCH: 31.3 pg (ref 26.0–34.0)
MCHC: 31.4 g/dL (ref 30.0–36.0)
MCV: 99.7 fL (ref 80.0–100.0)
Platelets: 118 10*3/uL — ABNORMAL LOW (ref 150–400)
RBC: 3 MIL/uL — ABNORMAL LOW (ref 3.87–5.11)
RDW: 15.9 % — ABNORMAL HIGH (ref 11.5–15.5)
WBC: 7 10*3/uL (ref 4.0–10.5)
nRBC: 1.3 % — ABNORMAL HIGH (ref 0.0–0.2)

## 2020-08-27 LAB — URIC ACID: Uric Acid, Serum: 14.5 mg/dL — ABNORMAL HIGH (ref 2.5–7.1)

## 2020-08-27 LAB — PROTIME-INR
INR: 4.5 (ref 0.8–1.2)
Prothrombin Time: 41.4 seconds — ABNORMAL HIGH (ref 11.4–15.2)

## 2020-08-27 LAB — LACTATE DEHYDROGENASE: LDH: 238 U/L — ABNORMAL HIGH (ref 98–192)

## 2020-08-27 LAB — GLUCOSE, CAPILLARY: Glucose-Capillary: 120 mg/dL — ABNORMAL HIGH (ref 70–99)

## 2020-08-27 MED ORDER — CHLORHEXIDINE GLUCONATE CLOTH 2 % EX PADS
6.0000 | MEDICATED_PAD | Freq: Every day | CUTANEOUS | Status: DC
  Administered 2020-08-27 – 2020-08-28 (×2): 6 via TOPICAL

## 2020-08-27 MED ORDER — NOREPINEPHRINE 4 MG/250ML-% IV SOLN
0.0000 ug/min | INTRAVENOUS | Status: DC
  Administered 2020-08-27: 22:00:00 4 ug/min via INTRAVENOUS
  Filled 2020-08-27: qty 250

## 2020-08-27 MED ORDER — FUROSEMIDE 10 MG/ML IJ SOLN
80.0000 mg | Freq: Once | INTRAMUSCULAR | Status: AC
  Filled 2020-08-27: qty 8

## 2020-08-27 MED ORDER — SODIUM CHLORIDE 0.9 % IV SOLN
3.0000 mg | Freq: Once | INTRAVENOUS | Status: AC
  Administered 2020-08-27: 13:00:00 3 mg via INTRAVENOUS
  Filled 2020-08-27: qty 2

## 2020-08-27 MED ORDER — VITAMIN K1 10 MG/ML IJ SOLN
2.0000 mg | Freq: Once | INTRAMUSCULAR | Status: AC
  Administered 2020-08-27: 17:00:00 2 mg via SUBCUTANEOUS
  Filled 2020-08-27: qty 0.2

## 2020-08-27 MED ORDER — POTASSIUM CHLORIDE 10 MEQ/100ML IV SOLN
10.0000 meq | INTRAVENOUS | Status: AC
  Administered 2020-08-27 (×4): 10 meq via INTRAVENOUS
  Filled 2020-08-27 (×4): qty 100

## 2020-08-27 MED ORDER — FUROSEMIDE 10 MG/ML IJ SOLN
40.0000 mg | INTRAMUSCULAR | Status: AC
  Administered 2020-08-27: 01:00:00 40 mg via INTRAVENOUS
  Filled 2020-08-27: qty 4

## 2020-08-27 MED ORDER — DEXAMETHASONE SODIUM PHOSPHATE 10 MG/ML IJ SOLN
10.0000 mg | INTRAMUSCULAR | Status: DC
  Administered 2020-08-29: 07:00:00 10 mg via INTRAVENOUS
  Filled 2020-08-27 (×2): qty 1

## 2020-08-27 MED ORDER — FUROSEMIDE 10 MG/ML IJ SOLN
40.0000 mg | Freq: Once | INTRAMUSCULAR | Status: AC
  Administered 2020-08-27: 10:00:00 40 mg via INTRAVENOUS
  Filled 2020-08-27: qty 4

## 2020-08-27 NOTE — H&P (Signed)
Chief Complaint: Multiple Myeloma  Referring Physician(s): Alvy Bimler  Supervising Physician: Arne Cleveland  Patient Status: [facility] - In-pt  History of Present Illness: Brianna Hanson is a 85 y.o. female known to our service for previous vertebroplasty procedures by Dr. Estanislado Pandy.  She has severe systolic HF due to NICM s/p HM 3 LVAD (04/06/13),PAF, and plasma cell disorder (Likely IgA MGUS) which is followed by Dr. [name].  She was admitted on 08/31/2020 for weakness, altered mental status and hypercalcemia in setting of likely new diagnosis of multiple myeloma.  We are asked to perform a bone marrow biopsy to confirm diagnosis.  She is quite lethargic and history taken from chart and caregiver.  Past Medical History:  Diagnosis Date  . AICD (automatic cardioverter/defibrillator) present   . Anemia   . Atrial fibrillation or flutter   . Cardiac arrest - ventricular fibrillation 12/10   with successful resucitation, S/p ICD  . CHF (congestive heart failure) ([place])   . Diverticula of colon 2011  . HTN (hypertension)    moderate  . ICD (implantable cardiac defibrillator) in place    she has received appropriate therapy for VF  . Internal and external hemorrhoids without complication 5638  . Nonischemic cardiomyopathy ([place])    followed by Dr April Holding at [facility]  . Osteopenia   . Plasma cell disorder 03/20/2012  . Plasma cell disorder 03/20/2012  . Sessile colonic polyp 2011   Dr Benson Norway    Past Surgical History:  Procedure Laterality Date  . ABDOMINAL HYSTERECTOMY    . CARDIAC CATHETERIZATION    . CARDIAC DEFIBRILLATOR PLACEMENT     by Greggory Brandy for secondary prevention of sudden death  . CARDIOVERSION N/A 2019/07/08   Procedure: CARDIOVERSION;  Surgeon: Jolaine Artist, MD;  Location: [facility] ENDOSCOPY;  Service: Cardiovascular;  Laterality: N/A;  . CHOLECYSTECTOMY N/A 10/09/2015   Procedure: LAPAROSCOPIC CHOLECYSTECTOMY;  Surgeon: Rolm Bookbinder, MD;  Location: [place];  Service:  General;  Laterality: N/A;  . COLONOSCOPY W/ POLYPECTOMY  2011   Dr Benson Norway  . INSERTION OF IMPLANTABLE LEFT VENTRICULAR ASSIST DEVICE N/A 04/06/2013   Procedure: INSERTION OF IMPLANTABLE LEFT VENTRICULAR ASSIST DEVICE;  Surgeon: Gaye Pollack, MD;  Location: [place];  Service: Open Heart Surgery;  Laterality: N/A;  . INTRAOPERATIVE TRANSESOPHAGEAL ECHOCARDIOGRAM N/A 04/06/2013   Procedure: INTRAOPERATIVE TRANSESOPHAGEAL ECHOCARDIOGRAM;  Surgeon: Gaye Pollack, MD;  Location: [place] OR;  Service: Open Heart Surgery;  Laterality: N/A;  . IR GENERIC HISTORICAL  09/24/2016   IR US GUIDE VASC ACCESS RIGHT 09/24/2016 Greggory Keen, MD [facility]-INTERV RAD  . IR GENERIC HISTORICAL  09/24/2016   IR ANGIOGRAM VISCERAL SELECTIVE 09/24/2016 Greggory Keen, MD [facility]  . IR GENERIC HISTORICAL  09/24/2016   IR ANGIOGRAM VISCERAL SELECTIVE 09/24/2016 Greggory Keen, MD [facility]  . IR VERTEBROPLASTY EA ADDL (T&LS) BX INC UNI/BIL INC INJECT/IMAGING  12/29/2019  . IR VERTEBROPLASTY EA ADDL (T&LS) BX INC UNI/BIL INC INJECT/IMAGING  12/29/2019  . IR VERTEBROPLASTY EA ADDL (T&LS) BX INC UNI/BIL INC INJECT/IMAGING  12/29/2019  . IR VERTEBROPLASTY LUMBAR BX INC UNI/BIL INC/INJECT/IMAGING  12/29/2019  . PLACEMENT OF CENTRIMAG VENTRICULAR ASSIST DEVICE Right 04/06/2013   Procedure: PLACEMENT OF CENTRIMAG VENTRICULAR ASSIST DEVICE;  Surgeon: Gaye Pollack, MD;  Location: [place];  Service: Open Heart Surgery;  Laterality: Right;  . RADIOLOGY WITH ANESTHESIA N/A 12/29/2019   Procedure: KYPHOPLASTY;  Surgeon: Luanne Bras, MD;  Location: [place];  Service: Radiology;  Laterality: N/A;    Allergies: Oxycodone  Medications: Prior to Admission medications   Medication Sig Start Date End Date Taking? Authorizing Provider  amiodarone (PACERONE) 200 MG tablet TAKE 1 TABLET BY MOUTH EVERY DAY Patient taking differently: Take 200 mg by mouth daily. 03/29/20  Yes [name], Shaune Pascal, MD  calcitonin, salmon, (MIACALCIN/FORTICAL) 200  UNIT/ACT nasal spray Place 1 spray into alternate nostrils daily. 02/25/20  Yes [name], Shaune Pascal, MD  carvedilol (COREG) 6.25 MG tablet TAKE 1 TABLET BY MOUTH 2 TIMES DAILY WITH A MEAL Patient taking differently: Take 6.25 mg by mouth 2 (two) times daily with a meal. 07/10/20  Yes [name], Shaune Pascal, MD  Cholecalciferol (VITAMIN D3) 25 MCG (1000 UT) CAPS Take 1,000 Units by mouth daily.    Yes [provider]  docusate sodium (COLACE) 100 MG capsule Take 100 mg by mouth daily.   Yes [provider]  doxycycline (VIBRAMYCIN) 50 MG capsule Take 2 capsules (100 mg total) by mouth 2 (two) times daily. 08/22/20  Yes [name], Shaune Pascal, MD  ferrous sulfate 325 (65 FE) MG tablet Take 1 tablet (325 mg total) by mouth 2 (two) times daily with a meal. 07/11/15  Yes [name], Shaune Pascal, MD  furosemide (LASIX) 20 MG tablet Take 20 mg by mouth daily as needed for fluid or edema.   Yes [provider]  gabapentin (NEURONTIN) 100 MG capsule Take 3 capsules (300 mg total) by mouth 2 (two) times daily. Patient taking differently: Take 100 mg by mouth in the morning and at bedtime. 10/25/19  Yes [name], Shaune Pascal, MD  hydrALAZINE (APRESOLINE) 100 MG tablet Take 0.5 tablets (50 mg total) by mouth 3 (three) times daily. 01/07/20  Yes Clegg, Amy D, NP  losartan (COZAAR) 50 MG tablet Take 1 tablet (50 mg total) by mouth daily. Patient taking differently: Take 25 mg by mouth daily. 04/13/20  Yes [name], Shaune Pascal, MD  magnesium oxide (MAG-OX) 400 (241.3 Mg) MG tablet Take 0.5 tablets (200 mg total) by mouth 2 (two) times daily. 02/17/20  Yes [name], Shaune Pascal, MD  pantoprazole (PROTONIX) 40 MG tablet TAKE 1 TABLET BY MOUTH EVERY DAY Patient taking differently: Take 40 mg by mouth daily as needed (for reflux). 06/26/20  Yes [name], Shaune Pascal, MD  potassium chloride SA (KLOR-CON M20) 20 MEQ tablet Take 1 tablet (20 mEq total) by mouth 2 (two) times daily. 08/16/19  Yes [name], Shaune Pascal, MD   traMADol (ULTRAM) 50 MG tablet Take 2 tablets (100 mg total) by mouth every 12 (twelve) hours as needed for moderate pain. 06/08/20  Yes [name], Shaune Pascal, MD  warfarin (COUMADIN) 2.5 MG tablet Take 5 mg (2 tabs) every Monday and 2.5 mg (1 tab) all other days or as directed by HF Clinic Patient taking differently: Take 2.5-5 mg by mouth See admin instructions. Take 1 tablet (2.5 mg totally) by mouth in the evening on Sun/Tues/Wed/Thurs/Fri/Sat and except 2 tablets ( 5 mg totally) by mouth on Mon 03/14/20  Yes Orma Render, RPH-CPP  ondansetron (ZOFRAN) 4 MG tablet Take 1 tablet (4 mg total) by mouth every 8 (eight) hours as needed for nausea or vomiting. 12/22/19   [name], Shaune Pascal, MD  promethazine (PHENERGAN) 12.5 MG tablet Take 1 tablet (12.5 mg total) by mouth every 6 (six) hours as needed for nausea or vomiting. 12/22/19   [name], Shaune Pascal, MD     Family History  Problem Relation Age of Onset  . Stroke Mother   . Stroke Brother     Social  History   Socioeconomic History  . Marital status: Soil scientist    Spouse name: Not on file  . Number of children: 3  . Years of education: Not on file  . Highest education level: Not on file  Occupational History  . Not on file  Tobacco Use  . Smoking status: Never Smoker  . Smokeless tobacco: Never Used  . Tobacco comment: remote history of tobacco abuse  Vaping Use  . Vaping Use: Never used  Substance and Sexual Activity  . Alcohol use: Yes    Comment: occsionally wine  . Drug use: No  . Sexual activity: Not Currently    Birth control/protection: Surgical  Other Topics Concern  . Not on file  Social History Narrative   Lives with spouse who was recently diagnosed with esophageal ca and is receiving chemotherapy   Social Determinants of Health   Financial Resource Strain: Not on file  Food Insecurity: Not on file  Transportation Needs: Not on file  Physical Activity: Not on file  Stress: Not on file  Social  Connections: Not on file    Review of Systems  Unable to perform ROS: Other  Drowsy  Vital Signs: BP 94/73 (BP Location: Left Arm)   Pulse (!) 106   Temp 98.1 F (36.7 C) (Oral)   Resp (!) 38   Ht 5' (1.524 m)   Wt 69 kg   SpO2 98%   BMI 29.71 kg/m   Physical Exam Vitals reviewed.  Constitutional:      Comments: Lethargic  HENT:     Head: Normocephalic and atraumatic.  Eyes:     Extraocular Movements: Extraocular movements intact.  Cardiovascular:     Rate and Rhythm: Regular rhythm. Tachycardia present.     Comments: LVAD Pulmonary:     Effort: No respiratory distress.     Breath sounds: Rhonchi present.     Comments: tachypneic Abdominal:     General: There is no distension.     Palpations: Abdomen is soft.     Tenderness: There is no abdominal tenderness.  Musculoskeletal:        General: Swelling present.  Skin:    General: Skin is warm and dry.  Neurological:     General: No focal deficit present.     Imaging: CT Head Wo Contrast  Result Date: 08/24/2020 CLINICAL DATA:  Mental status changes, lethargy and weakness for 2 days; history of atrial fibrillation, CHF, hypertension, non ischemic cardiomyopathy, plasma cell disorder EXAM: CT HEAD WITHOUT CONTRAST TECHNIQUE: Contiguous axial images were obtained from the base of the skull through the vertex without intravenous contrast. Sagittal and coronal MPR images reconstructed from axial data set. COMPARISON:  At 08/08/2012 FINDINGS: Brain: Normal ventricular morphology. No midline shift or mass effect. Normal appearance of brain parenchyma. No intracranial hemorrhage, mass lesion or evidence of acute infarction. No extra-axial fluid collections. Vascular: No hyperdense vessels. Atherosclerotic calcification of internal carotid and vertebral arteries at skull base noted. Skull: Osseous demineralization. Multiple lytic foci are seen without surrounding sclerosis, highly suspicious for multiple myeloma in this  patient with a history of a plasma cell disorder. Sinuses/Orbits: Clear Other: N/A IMPRESSION: No acute intracranial abnormalities. Multiple lytic foci throughout the skull base highly suspicious for multiple myeloma in this patient with a history of a plasma cell disorder; differential diagnosis includes lytic metastatic disease as well. Findings called to Dr. Ronnald Nian on 08/26/2020 at 1428 hours. Electronically Signed   By: Lavonia Dana M.D.   On: 08/24/2020  14:28   DG Chest Port 1 View  Result Date: 08/26/2020 CLINICAL DATA:  Bibasilar crackles. EXAM: PORTABLE CHEST 1 VIEW COMPARISON:  X-ray 1 day prior FINDINGS: The heart size is significantly enlarged. There is a left ventricular assist device in place. There is a biventricular pacemaker. The lung volumes are low. There is vascular congestion with developing pulmonary edema. There are bilateral pleural effusions. IMPRESSION: Growing bilateral pleural effusions, otherwise no significant interval change. Electronically Signed   By: Constance Holster M.D.   On: 08/26/2020 20:44   DG Chest Portable 1 View  Result Date: 08/19/2020 CLINICAL DATA:  Weakness, oxygen desaturation, less responsive, shortness of breath, lethargy EXAM: PORTABLE CHEST 1 VIEW COMPARISON:  Portable exam 1245 hours compared to 08/21/2020 FINDINGS: LEFT subclavian ICD with leads projecting over RIGHT atrium and RIGHT ventricle. LVAD noted. Enlargement of cardiac silhouette with slight vascular congestion. Atherosclerotic calcification aorta. Low lung volumes with bibasilar atelectasis. Question minimal pulmonary edema. No pleural effusion or pneumothorax. Bones demineralized. IMPRESSION: Enlargement of cardiac silhouette with vascular congestion post ICD and LVAD. Bibasilar atelectasis and question minimal pulmonary edema. Electronically Signed   By: Lavonia Dana M.D.   On: 08/28/2020 12:55   DG Chest Portable 1 View  Result Date: 08/21/2020 CLINICAL DATA:  85 year old female with  chest pain. EXAM: PORTABLE CHEST 1 VIEW COMPARISON:  Chest radiograph dated 12/22/2019. FINDINGS: There are bibasilar atelectasis/scarring. No focal consolidation, pleural effusion or pneumothorax. Stable cardiomegaly. Median sternotomy wires and left pectoral AICD device. Partially visualized LVAD. Atherosclerotic calcification of the aorta. No acute osseous pathology. IMPRESSION: No active disease.  No interval change. Electronically Signed   By: Anner Crete M.D.   On: 08/21/2020 15:19   DG Bone Survey Met  Result Date: 08/26/2020 CLINICAL DATA:  Myeloma EXAM: METASTATIC BONE SURVEY COMPARISON:  Abdomen and pelvis CT 12/26/2019 FINDINGS: Skull: Innumerable lucencies throughout the calvarium. Upper extremities: Large lytic lesion at the right scapula near the neck and at the base of the scapular spine. At least 3 lytic lesions are seen in the right humeral shaft. Spine: Compression fractures have occurred at T10, T12, L1, L2, L3, L4, and L5. Bone augmentation at T10, T12, L3, and L4. Compression fractures have occurred at L2 and L5 since April 2021 CT. There is a lytic appearance to the T11 body which is attributed to myeloma. Myeloma deposit in the L5 spinous process. Lower extremities: Heterogeneous density in the bilateral femoral diaphyses likely reflecting myeloma. Prominent osteopenia diffusely. Heterogeneous trabeculated in the upper tibia on both sides which is symmetric and indeterminate. No femoral neck fracture or discrete femoral neck erosion. Pelvis: Symmetric osteopenia.  No discrete lesion is seen. Chest and ribs: Cardiomegaly with dual-chamber pacer implant and left ventricular assist device. No pulmonary edema, effusion, or pneumothorax. No resolved rib lesions. IMPRESSION: 1. Widespread myeloma and generalized osteopenia. 2. Compression fractures of T10, T12, and L1-L5. L2 and L5 compression fractures are new from April 2021. Electronically Signed   By: Monte Fantasia M.D.   On:  08/26/2020 09:04    Labs:  CBC: Recent Labs    08/21/20 1551 08/31/2020 1300 08/26/20 0035 08/27/20 0435  WBC 9.1 10.5 9.4 7.0  HGB 10.5* 10.7* 9.5* 9.4*  HCT 35.2* 35.9* 31.2* 29.9*  PLT 177 135* 122* 118*    COAGS: Recent Labs    08/22/20 0000 08/28/2020 1300 08/26/20 0035 08/27/20 0435  INR 2.4 3.1* 3.4* 4.5*    BMP: Recent Labs    01/06/20 0338 01/07/20 0301 02/17/20  9470 04/13/20 1013 06/08/20 1021 08/03/2020 1300 08/26/20 0035 08/26/20 2138 08/27/20 0435  NA 139 139 138 139   < > 142 143 143 144  K 4.2 4.2 4.5 4.3   < > 4.3 4.3 4.0 3.0*  CL 104 104 108 106   < > 108 108 110 109  CO2 $Re'26 26 22 22   'FMP$ < > 22 23 21* 22  GLUCOSE 94 102* 92 99   < > 113* 143* 154* 123*  BUN $Re'10 9 13 18   'ZIu$ < > 20 22 28* 28*  CALCIUM 9.1 9.2 9.5 9.6   < > >15.0* 14.4* 11.2* 11.0*  CREATININE 0.88 0.81 0.91 1.09*   < > 1.99* 2.09* 1.87* 2.08*  GFRNONAA >60 >60 58* 47*   < > 24* 23* 26* 23*  GFRAA >60 >60 >60 54*  --   --   --   --   --    < > = values in this interval not displayed.    LIVER FUNCTION TESTS: Recent Labs    04/13/20 1013 08/22/2020 1245 08/26/20 0035 08/27/20 0435  BILITOT 0.6 1.1 1.2 1.1  AST 25 36 31 28  ALT $Re'21 23 21 20  'ZiF$ ALKPHOS 93 105 94 85  PROT 9.7* >12.0* 11.5* 10.9*  ALBUMIN 3.3* 2.8* 2.5* 2.4*    TUMOR MARKERS: No results for input(s): AFPTM, CEA, CA199, CHROMGRNA in the last 8760 hours.  Assessment and Plan:  Presumed Multiple Myeloma  Will proceed with image guided bone marrow biopsy tentatively for Monday as IR schedule allows.  Risks and benefits of bone marrow biopsy was discussed with the patient's caregiver including, but not limited to bleeding, infection, damage to adjacent structures or low yield requiring additional tests.  All of the questions were answered and there is agreement to proceed.  Consent signed and in chart.  Thank you for this interesting consult.  I greatly enjoyed meeting NERA HAWORTH and look forward to  participating in their care.  A copy of this report was sent to the requesting provider on this date.  Electronically Signed: Murrell Redden, PA-C   08/27/2020, 10:03 AM      I spent a total of    25 Minutes in face to face in clinical consultation, greater than 50% of which was counseling/coordinating care for bone marrow biopsy.

## 2020-08-27 NOTE — Progress Notes (Signed)
ANTICOAGULATION CONSULT NOTE - Initial Consult  Pharmacy Consult for warfarin  Indication: LVAD  Allergies  Allergen Reactions  . Oxycodone Other (See Comments)    Hallucinations     Patient Measurements: Height: 5' (152.4 cm) Weight: 70.6 kg (155 lb 10.3 oz) IBW/kg (Calculated) : 45.5   Vital Signs: Temp: 97.7 F (36.5 C) (12/26 1538) Temp Source: Oral (12/26 1538) BP: 84/70 (12/26 1536) Pulse Rate: 105 (12/26 1536)  Labs: Recent Labs    08/03/2020 1245 08/02/2020 1300 08/23/2020 1300 08/26/20 0035 08/26/20 2138 08/27/20 0435  HGB  --  10.7*   < > 9.5*  --  9.4*  HCT  --  35.9*  --  31.2*  --  29.9*  PLT  --  135*  --  122*  --  118*  LABPROT  --  30.6*  --  32.9*  --  41.4*  INR  --  3.1*  --  3.4*  --  4.5*  CREATININE  --  1.99*   < > 2.09* 1.87* 2.08*  TROPONINIHS 30*  --   --   --   --   --    < > = values in this interval not displayed.    Estimated Creatinine Clearance: 18 mL/min (A) (by C-G formula based on SCr of 2.08 mg/dL (H)).   Medical History: Past Medical History:  Diagnosis Date  . AICD (automatic cardioverter/defibrillator) present   . Anemia   . Atrial fibrillation or flutter   . Cardiac arrest - ventricular fibrillation 12/10   with successful resucitation, S/p ICD  . CHF (congestive heart failure) ([place])   . Diverticula of colon 2011  . HTN (hypertension)    moderate  . ICD (implantable cardiac defibrillator) in place    she has received appropriate therapy for VF  . Internal and external hemorrhoids without complication 6962  . Nonischemic cardiomyopathy ([place])    followed by Dr April Holding at [facility]  . Osteopenia   . Plasma cell disorder 03/20/2012  . Plasma cell disorder 03/20/2012  . Sessile colonic polyp 2011   Dr Benson Norway      Assessment: 89yof with LVAD HM2 admitted with lethargy and SOB.  On Warfarin PTA 5mg  on Monday and 2.5mg  AOD.  Admit INR elevated 3.1 and continues to rise to 4.5 despite no warfarin doses given  H/h stable, no  bleeding   Goal of Therapy:  INR 1.8-2.3 Monitor platelets by anticoagulation protocol: Yes   Plan:  No warfarin  Vitamin K sq 2mg  x1 dose Daily BC and protime   Bonnita Nasuti Pharm.D. CPP, BCPS Clinical Pharmacist [phone] 08/27/2020 4:07 PM

## 2020-08-27 NOTE — Progress Notes (Signed)
Patient was tachypneic 30's most of time, at times going up to 40's. SpO2 stayed 96-98% with HF 8L which was down from 10L. Lung sound is inspiratory was clear, but expiratory was rales. She received already 80 mg of lasix during at night. Notified Ebony Hail regarding this matter, administered lasix 80 mg. K+ was 3.0 in the morning. Replaced potassium after one hour administered lasix. Continue to monitor respiratory, but not much response with lasix only 220 ml and breathing sound was more wet sound. Getting hypotensive at 1220. [name] came to check patient. Ordered CXR and ABG. [name] ordered to transfer to [facility] [place]. [employer]

## 2020-08-27 NOTE — Progress Notes (Signed)
Advanced Heart Failure VAD Team Note  PCP-Cardiologist: No primary care provider on file.   Subjective:    Remains lethargic but will arouse. IVF increased yesterday and IV lasix started.   Respiratory status worse today Breathing 40/min. No on HFNC, Says she is SOB.   MAPs in mid 60s-70. Calcium down to 11.0.   LVAD INTERROGATION:  HeartMate II LVAD:   Flow 4.1 liters/min, speed 8600, power 4.0, PI 5.2.  VAD interrogated personally. Parameters stable.  Objective:    Vital Signs:   Temp:  [97.5 F (36.4 C)-99.8 F (37.7 C)] 99.8 F (37.7 C) (12/26 1220) Pulse Rate:  [93-121] 111 (12/26 1307) Resp:  [21-39] 39 (12/26 1307) BP: (62-118)/(54-89) 72/60 (12/26 1307) SpO2:  [89 %-98 %] 94 % (12/26 1307) Weight:  [69 kg] 69 kg (12/26 0400) Last BM Date: 08/26/20 Mean arterial Pressure 60-70  Intake/Output:   Intake/Output Summary (Last 24 hours) at 08/27/2020 1336 Last data filed at 08/27/2020 1313 Gross per 24 hour  Intake 2923.29 ml  Output 2270 ml  Net 653.29 ml     Physical Exam    General:  Ill appearing. Tachypneic. Lethargic but will arouse .  HEENT: normal  Neck: supple. JVP to jaw Carotids 2+ bilat; no bruits. No lymphadenopathy or thryomegaly appreciated. Cor: LVAD hum.  Lungs: + rhonchi Abdomen: soft, nontender, non-distended. No hepatosplenomegaly. No bruits or masses. Good bowel sounds. Driveline site clean. Anchor in place.  Extremities: no cyanosis, clubbing, rash. Warm no edema  Neuro: lethargic   Telemetry   NSR 100-110 Personally reviewed   Labs   Basic Metabolic Panel: Recent Labs  Lab 08/21/20 1551 08/08/2020 1300 08/26/20 0035 08/26/20 2138 08/27/20 0435  NA 136 142 143 143 144  K 5.7* 4.3 4.3 4.0 3.0*  CL 104 108 108 110 109  CO2 _0 21* 22  GLUCOSE 102* 113* 143* 154* 123*  BUN _1 28* 28*  CREATININE 1.32* 1.99* 2.09* 1.87* 2.08*  CALCIUM 11.2* >15.0* 14.4* 11.2* 11.0*    Liver Function Tests: Recent Labs  Lab  08/11/2020 1245 08/26/20 0035 08/27/20 0435  AST 36 31 28  ALT _2 ALKPHOS 105 94 85  BILITOT 1.1 1.2 1.1  PROT >12.0* 11.5* 10.9*  ALBUMIN 2.8* 2.5* 2.4*   No results for input(s): LIPASE, AMYLASE in the last 168 hours. No results for input(s): AMMONIA in the last 168 hours.  CBC: Recent Labs  Lab 08/21/20 1551 08/18/2020 1300 08/26/20 0035 08/27/20 0435  WBC 9.1 10.5 9.4 7.0  NEUTROABS  --  6.0  --   --   HGB 10.5* 10.7* 9.5* 9.4*  HCT 35.2* 35.9* 31.2* 29.9*  MCV 102.6* 103.2* 101.3* 99.7  PLT 177 135* 122* 118*    INR: Recent Labs  Lab 08/21/20 1651 08/22/20 0000 08/17/2020 1300 08/26/20 0035 08/27/20 0435  INR 2.6* 2.4 3.1* 3.4* 4.5*    Other results:     Imaging   CT Head Wo Contrast  Result Date: 08/18/2020 CLINICAL DATA:  Mental status changes, lethargy and weakness for 2 days; history of atrial fibrillation, CHF, hypertension, non ischemic cardiomyopathy, plasma cell disorder EXAM: CT HEAD WITHOUT CONTRAST TECHNIQUE: Contiguous axial images were obtained from the base of the skull through the vertex without intravenous contrast. Sagittal and coronal MPR images reconstructed from axial data set. COMPARISON:  At 08/08/2012 FINDINGS: Brain: Normal ventricular morphology. No midline shift or mass effect. Normal appearance of brain parenchyma. No intracranial hemorrhage, mass lesion or  evidence of acute infarction. No extra-axial fluid collections. Vascular: No hyperdense vessels. Atherosclerotic calcification of internal carotid and vertebral arteries at skull base noted. Skull: Osseous demineralization. Multiple lytic foci are seen without surrounding sclerosis, highly suspicious for multiple myeloma in this patient with a history of a plasma cell disorder. Sinuses/Orbits: Clear Other: N/A IMPRESSION: No acute intracranial abnormalities. Multiple lytic foci throughout the skull base highly suspicious for multiple myeloma in this patient with a history of a  plasma cell disorder; differential diagnosis includes lytic metastatic disease as well. Findings called to Dr. Ronnald Nian on 08/28/2020 at 1428 hours. Electronically Signed   By: Lavonia Dana M.D.   On: 08/04/2020 14:28   DG Chest Port 1 View  Result Date: 08/26/2020 CLINICAL DATA:  Bibasilar crackles. EXAM: PORTABLE CHEST 1 VIEW COMPARISON:  X-ray 1 day prior FINDINGS: The heart size is significantly enlarged. There is a left ventricular assist device in place. There is a biventricular pacemaker. The lung volumes are low. There is vascular congestion with developing pulmonary edema. There are bilateral pleural effusions. IMPRESSION: Growing bilateral pleural effusions, otherwise no significant interval change. Electronically Signed   By: Constance Holster M.D.   On: 08/26/2020 20:44   DG Bone Survey Met  Result Date: 08/26/2020 CLINICAL DATA:  Myeloma EXAM: METASTATIC BONE SURVEY COMPARISON:  Abdomen and pelvis CT 12/26/2019 FINDINGS: Skull: Innumerable lucencies throughout the calvarium. Upper extremities: Large lytic lesion at the right scapula near the neck and at the base of the scapular spine. At least 3 lytic lesions are seen in the right humeral shaft. Spine: Compression fractures have occurred at T10, T12, L1, L2, L3, L4, and L5. Bone augmentation at T10, T12, L3, and L4. Compression fractures have occurred at L2 and L5 since April 2021 CT. There is a lytic appearance to the T11 body which is attributed to myeloma. Myeloma deposit in the L5 spinous process. Lower extremities: Heterogeneous density in the bilateral femoral diaphyses likely reflecting myeloma. Prominent osteopenia diffusely. Heterogeneous trabeculated in the upper tibia on both sides which is symmetric and indeterminate. No femoral neck fracture or discrete femoral neck erosion. Pelvis: Symmetric osteopenia.  No discrete lesion is seen. Chest and ribs: Cardiomegaly with dual-chamber pacer implant and left ventricular assist device. No  pulmonary edema, effusion, or pneumothorax. No resolved rib lesions. IMPRESSION: 1. Widespread myeloma and generalized osteopenia. 2. Compression fractures of T10, T12, and L1-L5. L2 and L5 compression fractures are new from April 2021. Electronically Signed   By: Monte Fantasia M.D.   On: 08/26/2020 09:04     Medications:     Scheduled Medications: . amiodarone  200 mg Oral Daily  . [START ON 08/28/2020] dexamethasone (DECADRON) injection  10 mg Intravenous Q24H  . docusate sodium  100 mg Oral Daily  . furosemide  40 mg Intravenous Daily  . hydrALAZINE  50 mg Oral TID  . pantoprazole  40 mg Oral Daily    Infusions: . potassium chloride 10 mEq (08/27/20 1203)  . rasburicase (ELITEK) IV infusion 3 mg (08/27/20 1313)    PRN Medications: acetaminophen, ondansetron (ZOFRAN) IV   Assessment/Plan:    1. Hypercalcemia, hyperproteinemia and lytic skull lesions in setting of known MGUS -> multiple myeloma - Skeletal survey 12/25: Widespread myeloma lytic lesions and generalized osteopenia. - almost certainly has conversion to multiple myeloma. I d/w Oncology yesterday and they are planning BMBx once hypercalcemia improved. I do not think she is stable enough for this currently.  - Calcium down to 11.0. Now with  pulmonary edema on exam. IVFs held. Giving lasix  - continue calcitonin 4 IUs/kg. Received zoledronic acid 3m on 12/24 - Decadron per Oncology - SPEP/UPEP, serum free light chains and beta-2 microglobuilin pending  2. AKI - Creatinine up to 2.1 today. 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 Acute hypoxic respiratory failure - suspect pulmonary edema.  - have given lasix with minimal response.  - check stat CXR and ABG - will likely need ICU care  4. Acute on Chronic systolic HLJ:QGBE s/p ICD and LVAD for DT(04/2013).  - now > 7 years out on VAD support -  Volume up in setting of hydration for hypercalcemia. Minimal response to lasix.   5.   LVAD placed for DT 04/2013:  -VAD interrogated personally. Parameters stable. - No ASA with GI bleed and intolerance.  - INR 3.1-> 3.4 -> 4.5 Coumadin has been held since admit. Suspect low flow to liver - INR Goal 1.8-2.3 - give vitamin K 27mSQ  6. PAF:  - Did not tolerate AF well at all.  - S/p DC-CV 06/18/19 -Remainsin NSR on  amiodarone 200 daily.  7.  HTN: - BP low. May need pressors.   8. Goals of care - she has multi-system organ failure. Will d/w he HCPOA regarding how aggressive she would want us[place] be  I reviewed the LVAD parameters from today, and compared the results to the patient's prior recorded data.  No programming changes were made.  The LVAD is functioning within specified parameters.  The patient performs LVAD self-test daily.  LVAD interrogation was negative for any significant power changes, alarms or PI events/speed drops.  LVAD equipment check completed and is in good working order.  Back-up equipment present.   LVAD education done on emergency procedures and precautions and reviewed exit site care.  CRITICAL CARE Performed by: BeGlori BickersTotal critical care time: 45 minutes  Critical care time was exclusive of separately billable procedures and treating other patients.  Critical care was necessary to treat or prevent imminent or life-threatening deterioration.  Critical care was time spent personally by me (independent of midlevel providers or residents) on the following activities: development of treatment plan with patient and/or surrogate as well as nursing, discussions with consultants, evaluation of patient's response to treatment, examination of patient, obtaining history from patient or surrogate, ordering and performing treatments and interventions, ordering and review of laboratory studies, ordering and review of radiographic studies, pulse oximetry and re-evaluation of patient's condition.    Length of Stay: 2  DaGlori Bickers MD 08/27/2020, 1:36 PM  VAD Team --- VAD ISSUES ONLY--- Pager [phone] - 7am)  Advanced Heart Failure Team  Pager [phone]-F; 7a - 4p)  Please contact [email or web address] for night-coverage after hours (4p -7a ) and weekends on [email or web address]

## 2020-08-27 NOTE — Progress Notes (Signed)
Brianna Hanson   DOB:10/24/1934   MR#:[id number]    ASSESSMENT & PLAN:  Multiple myeloma causing hypercalcemia until proven otherwise Myeloma panel has been drawn, pending Skeletal survey from today confirmed numerous lytic lesions consistent with multiple myeloma She would need bone marrow aspirate and biopsy prior to treatment, will order for next week Continue aggressive supportive care and management of hypercalcemia and acute renal failure in the meantime  Multiple compression fractures, likely due to multiple myeloma The first documented compression fracture was in December 2020 She has received 1 dose of zometa X-ray is reviewed by myself which showed generalized osteopenia with multiple large lytic lesions on the right humerus  Severe hypercalcemia of malignancy causing altered mental status I discussed the plan of care with primary service Her hypercalcemia has improved with aggressive IV fluid resuscitation and furosemide Due to signs of fluid retention, I will reduce her dexamethasone to once a day She has received calcitonin on the day of admission  Signs of fluid overload She is receiving aggressive diuresis per cardiology service  Acute renal failure Likely due to malignancy and hyperuricemia If it does not improve, might have to get nephrology involved  Hyperuricemia due to malignancy We will add rasburicase Hold off allopurinol until uric acid is less than 5  Acquired pancytopenia Likely due to bone marrow infiltration She does not need transfusion support  Chronic congestive heart failure Would defer to primary service for management  Goals of care discussion Multiple myeloma is treatable but not curable If her performance status improves, there are options available to treat even in the presence of renal failure According to her significant other, her functional status appears to be reasonable approximately a week prior to admission She is not a candidate  for chemotherapy right now due to her functional status but hopeful that she can begin treatment sometime in the future within a week or so  I will get Dr. Shadad to resume care next week  Ni Gorsuch, MD 08/27/2020 8:17 AM  Subjective:  The patient is seen this morning.  Her significant other, [name] is present by the bedside and he was able to provide some history She was noted to have altered mental status acutely over a few days prior to admission This morning, the patient continues appear lethargic She opens her eyes intermittently and answer questions appropriately  Objective:  Vitals:   08/27/20 0600 08/27/20 0729  BP: 94/76 94/73  Pulse:    Resp: (!) 35 (!) 38  Temp:  98.1 F (36.7 C)  SpO2: 97% 98%     Intake/Output Summary (Last 24 hours) at 08/27/2020 0817 Last data filed at 08/27/2020 0600 Gross per 24 hour  Intake 2923.29 ml  Output 2050 ml  Net 873.29 ml    GENERAL: Appears sleepy and lethargic LUNGS: Increased breathing effort.  Bilateral rales HEART noted increased jugular venous distention. ABDOMEN:abdomen soft, non-tender and normal bowel sounds  Labs:  Recent Labs    01/07/20 0301 02/17/20 0956 04/13/20 1013 06/08/20 1021 08/10/2020 1245 08/26/2020 1300 08/26/20 0035 08/26/20 2138 08/27/20 0435  NA 139 138 139   < >  --    < > 143 143 144  K 4.2 4.5 4.3   < >  --    < > 4.3 4.0 3.0*  CL 104 108 106   < >  --    < > 108 110 109  CO2 26 22 22   < >  --    < >   23 21* 22  GLUCOSE 102* 92 99   < >  --    < > 143* 154* 123*  BUN 9 13 18   < >  --    < > 22 28* 28*  CREATININE 0.81 0.91 1.09*   < >  --    < > 2.09* 1.87* 2.08*  CALCIUM 9.2 9.5 9.6   < >  --    < > 14.4* 11.2* 11.0*  GFRNONAA >60 58* 47*   < >  --    < > 23* 26* 23*  GFRAA >60 >60 54*  --   --   --   --   --   --   PROT  --   --  9.7*  --  >12.0*  --  11.5*  --  10.9*  ALBUMIN  --   --  3.3*  --  2.8*  --  2.5*  --  2.4*  AST  --   --  25  --  36  --  31  --  28  ALT  --   --  21  --   23  --  21  --  20  ALKPHOS  --   --  93  --  105  --  94  --  85  BILITOT  --   --  0.6  --  1.1  --  1.2  --  1.1  BILIDIR  --   --   --   --  0.2  --   --   --   --   IBILI  --   --   --   --  0.9  --   --   --   --    < > = values in this interval not displayed.    Studies:  CT Head Wo Contrast  Result Date: 08/09/2020 CLINICAL DATA:  Mental status changes, lethargy and weakness for 2 days; history of atrial fibrillation, CHF, hypertension, non ischemic cardiomyopathy, plasma cell disorder EXAM: CT HEAD WITHOUT CONTRAST TECHNIQUE: Contiguous axial images were obtained from the base of the skull through the vertex without intravenous contrast. Sagittal and coronal MPR images reconstructed from axial data set. COMPARISON:  At 08/08/2012 FINDINGS: Brain: Normal ventricular morphology. No midline shift or mass effect. Normal appearance of brain parenchyma. No intracranial hemorrhage, mass lesion or evidence of acute infarction. No extra-axial fluid collections. Vascular: No hyperdense vessels. Atherosclerotic calcification of internal carotid and vertebral arteries at skull base noted. Skull: Osseous demineralization. Multiple lytic foci are seen without surrounding sclerosis, highly suspicious for multiple myeloma in this patient with a history of a plasma cell disorder. Sinuses/Orbits: Clear Other: N/A IMPRESSION: No acute intracranial abnormalities. Multiple lytic foci throughout the skull base highly suspicious for multiple myeloma in this patient with a history of a plasma cell disorder; differential diagnosis includes lytic metastatic disease as well. Findings called to Dr. Curatolo on 08/30/2020 at 1428 hours. Electronically Signed   By: Mark  Boles M.D.   On: 08/24/2020 14:28   DG Chest Port 1 View  Result Date: 08/26/2020 CLINICAL DATA:  Bibasilar crackles. EXAM: PORTABLE CHEST 1 VIEW COMPARISON:  X-ray 1 day prior FINDINGS: The heart size is significantly enlarged. There is a left  ventricular assist device in place. There is a biventricular pacemaker. The lung volumes are low. There is vascular congestion with developing pulmonary edema. There are bilateral pleural effusions. IMPRESSION: Growing bilateral pleural effusions, otherwise no significant interval   change. Electronically Signed   By: Christopher  Green M.D.   On: 08/26/2020 20:44   DG Chest Portable 1 View  Result Date: 08/08/2020 CLINICAL DATA:  Weakness, oxygen desaturation, less responsive, shortness of breath, lethargy EXAM: PORTABLE CHEST 1 VIEW COMPARISON:  Portable exam 1245 hours compared to 08/21/2020 FINDINGS: LEFT subclavian ICD with leads projecting over RIGHT atrium and RIGHT ventricle. LVAD noted. Enlargement of cardiac silhouette with slight vascular congestion. Atherosclerotic calcification aorta. Low lung volumes with bibasilar atelectasis. Question minimal pulmonary edema. No pleural effusion or pneumothorax. Bones demineralized. IMPRESSION: Enlargement of cardiac silhouette with vascular congestion post ICD and LVAD. Bibasilar atelectasis and question minimal pulmonary edema. Electronically Signed   By: Mark  Boles M.D.   On: 08/03/2020 12:55   DG Chest Portable 1 View  Result Date: 08/21/2020 CLINICAL DATA:  85-year-old female with chest pain. EXAM: PORTABLE CHEST 1 VIEW COMPARISON:  Chest radiograph dated 12/22/2019. FINDINGS: There are bibasilar atelectasis/scarring. No focal consolidation, pleural effusion or pneumothorax. Stable cardiomegaly. Median sternotomy wires and left pectoral AICD device. Partially visualized LVAD. Atherosclerotic calcification of the aorta. No acute osseous pathology. IMPRESSION: No active disease.  No interval change. Electronically Signed   By: Arash  Radparvar M.D.   On: 08/21/2020 15:19   DG Bone Survey Met  Result Date: 08/26/2020 CLINICAL DATA:  Myeloma EXAM: METASTATIC BONE SURVEY COMPARISON:  Abdomen and pelvis CT 12/26/2019 FINDINGS: Skull: Innumerable  lucencies throughout the calvarium. Upper extremities: Large lytic lesion at the right scapula near the neck and at the base of the scapular spine. At least 3 lytic lesions are seen in the right humeral shaft. Spine: Compression fractures have occurred at T10, T12, L1, L2, L3, L4, and L5. Bone augmentation at T10, T12, L3, and L4. Compression fractures have occurred at L2 and L5 since April 2021 CT. There is a lytic appearance to the T11 body which is attributed to myeloma. Myeloma deposit in the L5 spinous process. Lower extremities: Heterogeneous density in the bilateral femoral diaphyses likely reflecting myeloma. Prominent osteopenia diffusely. Heterogeneous trabeculated in the upper tibia on both sides which is symmetric and indeterminate. No femoral neck fracture or discrete femoral neck erosion. Pelvis: Symmetric osteopenia.  No discrete lesion is seen. Chest and ribs: Cardiomegaly with dual-chamber pacer implant and left ventricular assist device. No pulmonary edema, effusion, or pneumothorax. No resolved rib lesions. IMPRESSION: 1. Widespread myeloma and generalized osteopenia. 2. Compression fractures of T10, T12, and L1-L5. L2 and L5 compression fractures are new from April 2021. Electronically Signed   By: Jonathon  Watts M.D.   On: 08/26/2020 09:04    

## 2020-08-27 NOTE — Progress Notes (Signed)
CRITICAL VALUE ALERT  Critical Value:  INR=4.5  Date & Time Notied:  08/27/2020 (05:50)  Provider Notified: Dr Radford Pax  Orders Received/Actions taken: Continue to hold coumadin.

## 2020-08-27 NOTE — CV Procedure (Addendum)
Radial arterial line placement.    Consent obtained from her HCPOA. The left wrist was prepped and draped in the routine sterile fashion with u/s guidance I was able to cannulate the right radial artery several times but unable to feed the wire. I switched to the right wrist and after an appropriate prep using u/s guidance a single lumen radial arterial catheter was placed in the righ radial artery using a modified Seldinger technique. Good blood flow and wave forms. A dressing was placed.     Brianna Bickers, MD  6:25 PM

## 2020-08-27 NOTE — CV Procedure (Cosign Needed)
Central Venous Catheter Insertion Procedure Note RAEVIN WIERENGA [id number] June 26, 1935    Procedure: Insertion of Central Venous Catheter Indications: Drug and/or fluid administration   Procedure Details Consent: Risks of procedure as well as the alternatives and risks of each were explained to the (patient/caregiver).  Consent for procedure obtained. Time Out: Verified patient identification, verified procedure, site/side was marked, verified correct patient position, special equipment/implants available, medications/allergies/relevent history reviewed, required imaging and test results available.  Performed   Maximum sterile technique was used including antiseptics, cap, gloves, gown, hand hygiene, mask and sheet. Skin prep: Chlorhexidine; local anesthetic administered. A antimicrobial bonded/coated triple lumen catheter was placed in the right internal jugular vein using the Seldinger technique and u/s guidance.   Evaluation Blood flow good Complications: No apparent complications Patient did tolerate procedure well. Chest X-ray ordered to verify placement.  CXR: pending   Glori Bickers, MD  6:26 PM

## 2020-08-27 NOTE — Progress Notes (Signed)
  Patient remains lethargic but arousable. Tachypneic.   Arterial line and central line placed.   CVP 13. Co-ox 49%  ABG  7.48/29/141/99% on 8L  Speed on VAD increased and will start NE 5.   CXR with worsening pulmonary edema.  Central line position ok.   Will give additional IV lasix  Code status discussed with her [place] [name]). Remains full code.   Repeat echo in am.   Additional CCT 30 mins.   Glori Bickers, MD  8:38 PM

## 2020-08-27 NOTE — Significant Event (Signed)
Rapid Response Note for 2nd assessment  Reason for Call : Tachypnea and increased oxygen requirement Initial Focused Assessment:  Roz[place] RN notified me because she wanted a 2nd assessment of Brianna Hanson due to tachypnea and increased oxygen requirement from 3L to [facility]. Upon arrival, Ms. Dwight is arousable. BBS rales throughout. Skin warm, pink, and dry. +JVD. LVAD hum. Pt is tachypneic 36-40 but little accessory muscle use, no retractions. Congested weak nonprod cough. Rozelle had previously notified Dr. Radford Pax of events and gave Lasix 40 mg about 2030. Pt did have a decent response but it was not measureable. Prior to my arrival, [facility] notified Dr. Radford Pax again and received further orders for additional Lasix 40 mg and stop IVF. Will assess response to this dose of lasix and if tachypnea does not improve, may use BIPAP temporarily for support.   0100- HR 109 ST, 115/73 (86), RR36 with sats 91% on 10L [place] HFNC.    Interventions:  -No acute interventions by RRRN  Plan of Care:  -Assess response to lasix -If respiratory compromise declines, support with BIPAP -Notify primary svc and/or RRRN for further assistance    MD Notified:  PTA Call Time: 0104 Arrival Time: 0115 End Time: 0140  Madelynn Done, RN

## 2020-08-27 NOTE — Progress Notes (Signed)
Received page from bedside RN stating patient's respirations are increasingly labored, and tachypneic (rate in 30s), with audible rhonchi. Overnight patient required HiFlo O2 up to 10L. Currently on 8L. IVF stopped overnight. VAD numbers and BP stable.   Per Dr Haroldine Laws, will give 80 mg IV Lasix with 40 meq IV Potassium (K 3.0 this morning.) Instructed to start IV K 1 hour after receiving Lasix. Bedside RN verbalized understanding of instructions.   Emerson Monte RN [place] Coordinator  Office: [phone]  24/7 Pager: [phone]

## 2020-08-27 NOTE — Progress Notes (Signed)
At the beginning of the shift during assessment pt noted on labored breathing with BBS rales throughout; Tachypneic upper 30's-40's. Increased Oxygen from 3L up to 6L/min via [place]. Dr Radford Pax informed. Lasix 40 mg IV given as ordered. Decreased IVF to 143ml/hr. Stat BMET & Chest xray done as ordered. Pt voided in large amount that soaked the bed. Placed a new purewick. Around 1:00 AM Rapid response called to rechecked pt. Dr Radford Pax informed of pt's Sats between 86-88 %. Changed [place] to HFNC to 10 L/min.Additional Lasix 40 mg IV given as ordered & stopped IVF. Will continue to monitor pt.

## 2020-08-28 ENCOUNTER — Inpatient Hospital Stay (HOSPITAL_COMMUNITY): Payer: Medicare Other

## 2020-08-28 ENCOUNTER — Other Ambulatory Visit (HOSPITAL_COMMUNITY): Payer: Self-pay

## 2020-08-28 DIAGNOSIS — I5021 Acute systolic (congestive) heart failure: Secondary | ICD-10-CM

## 2020-08-28 LAB — KAPPA/LAMBDA LIGHT CHAINS
Kappa free light chain: 9237.3 mg/L — ABNORMAL HIGH (ref 3.3–19.4)
Kappa, lambda light chain ratio: 1026.37 — ABNORMAL HIGH (ref 0.26–1.65)
Lambda free light chains: 9 mg/L (ref 5.7–26.3)

## 2020-08-28 LAB — COMPREHENSIVE METABOLIC PANEL
ALT: 21 U/L (ref 0–44)
ALT: 25 U/L (ref 0–44)
AST: 29 U/L (ref 15–41)
AST: 44 U/L — ABNORMAL HIGH (ref 15–41)
Albumin: 2.5 g/dL — ABNORMAL LOW (ref 3.5–5.0)
Albumin: 2.6 g/dL — ABNORMAL LOW (ref 3.5–5.0)
Alkaline Phosphatase: 79 U/L (ref 38–126)
Alkaline Phosphatase: 76 U/L (ref 38–126)
Anion gap: 15 (ref 5–15)
Anion gap: 17 — ABNORMAL HIGH (ref 5–15)
BUN: 45 mg/dL — ABNORMAL HIGH (ref 8–23)
BUN: 45 mg/dL — ABNORMAL HIGH (ref 8–23)
CO2: 23 mmol/L (ref 22–32)
CO2: 20 mmol/L — ABNORMAL LOW (ref 22–32)
Calcium: 10.8 mg/dL — ABNORMAL HIGH (ref 8.9–10.3)
Calcium: 9.8 mg/dL (ref 8.9–10.3)
Chloride: 109 mmol/L (ref 98–111)
Chloride: 105 mmol/L (ref 98–111)
Creatinine, Ser: 2.51 mg/dL — ABNORMAL HIGH (ref 0.44–1.00)
Creatinine, Ser: 2.05 mg/dL — ABNORMAL HIGH (ref 0.44–1.00)
GFR, Estimated: 19 mL/min — ABNORMAL LOW (ref >60)
GFR, Estimated: 24 mL/min — ABNORMAL LOW (ref >60)
Glucose, Bld: 150 mg/dL — ABNORMAL HIGH (ref 70–99)
Glucose, Bld: 143 mg/dL — ABNORMAL HIGH (ref 70–99)
Potassium: 3.2 mmol/L — ABNORMAL LOW (ref 3.5–5.1)
Potassium: 2.9 mmol/L — ABNORMAL LOW (ref 3.5–5.1)
Sodium: 147 mmol/L — ABNORMAL HIGH (ref 135–145)
Sodium: 142 mmol/L (ref 135–145)
Total Bilirubin: 1.1 mg/dL (ref 0.3–1.2)
Total Bilirubin: 1 mg/dL (ref 0.3–1.2)
Total Protein: 11.9 g/dL — ABNORMAL HIGH (ref 6.5–8.1)
Total Protein: 10.8 g/dL — ABNORMAL HIGH (ref 6.5–8.1)

## 2020-08-28 LAB — MAGNESIUM: Magnesium: 1.9 mg/dL (ref 1.7–2.4)

## 2020-08-28 LAB — MULTIPLE MYELOMA PANEL, SERUM
Albumin SerPl Elph-Mcnc: 3.1 g/dL (ref 2.9–4.4)
Albumin/Glob SerPl: 0.5 — ABNORMAL LOW (ref 0.7–1.7)
Alpha 1: 0.3 g/dL (ref 0.0–0.4)
Alpha2 Glob SerPl Elph-Mcnc: 0.6 g/dL (ref 0.4–1.0)
B-Globulin SerPl Elph-Mcnc: 0.9 g/dL (ref 0.7–1.3)
Gamma Glob SerPl Elph-Mcnc: 5 g/dL — ABNORMAL HIGH (ref 0.4–1.8)
Globulin, Total: 6.8 g/dL — ABNORMAL HIGH (ref 2.2–3.9)
IgA: 4620 mg/dL — ABNORMAL HIGH (ref 64–422)
IgG (Immunoglobin G), Serum: 510 mg/dL — ABNORMAL LOW (ref 586–1602)
IgM (Immunoglobulin M), Srm: 43 mg/dL (ref 26–217)
M Protein SerPl Elph-Mcnc: 4.4 g/dL — ABNORMAL HIGH
Total Protein ELP: 9.9 g/dL — ABNORMAL HIGH (ref 6.0–8.5)

## 2020-08-28 LAB — POCT I-STAT 7, (LYTES, BLD GAS, ICA,H+H)
Acid-base deficit: 1 mmol/L (ref 0.0–2.0)
Bicarbonate: 22.1 mmol/L (ref 20.0–28.0)
Calcium, Ion: 1.38 mmol/L (ref 1.15–1.40)
HCT: 29 % — ABNORMAL LOW (ref 36.0–46.0)
Hemoglobin: 9.9 g/dL — ABNORMAL LOW (ref 12.0–15.0)
O2 Saturation: 95 %
Potassium: 3.6 mmol/L (ref 3.5–5.1)
Sodium: 150 mmol/L — ABNORMAL HIGH (ref 135–145)
TCO2: 23 mmol/L (ref 22–32)
pCO2 arterial: 28.7 mmHg — ABNORMAL LOW (ref 32.0–48.0)
pH, Arterial: 7.494 — ABNORMAL HIGH (ref 7.350–7.450)
pO2, Arterial: 67 mmHg — ABNORMAL LOW (ref 83.0–108.0)

## 2020-08-28 LAB — CALCIUM, IONIZED: Calcium, Ionized, Serum: 7.8 mg/dL — ABNORMAL HIGH (ref 4.5–5.6)

## 2020-08-28 LAB — CBC
HCT: 30.7 % — ABNORMAL LOW (ref 36.0–46.0)
Hemoglobin: 9.2 g/dL — ABNORMAL LOW (ref 12.0–15.0)
MCH: 30.2 pg (ref 26.0–34.0)
MCHC: 30 g/dL (ref 30.0–36.0)
MCV: 100.7 fL — ABNORMAL HIGH (ref 80.0–100.0)
Platelets: 120 10*3/uL — ABNORMAL LOW (ref 150–400)
RBC: 3.05 MIL/uL — ABNORMAL LOW (ref 3.87–5.11)
RDW: 16 % — ABNORMAL HIGH (ref 11.5–15.5)
WBC: 9.7 10*3/uL (ref 4.0–10.5)
nRBC: 2 % — ABNORMAL HIGH (ref 0.0–0.2)

## 2020-08-28 LAB — LACTIC ACID, PLASMA
Lactic Acid, Venous: 2 mmol/L (ref 0.5–1.9)
Lactic Acid, Venous: 2.2 mmol/L (ref 0.5–1.9)
Lactic Acid, Venous: 1.6 mmol/L (ref 0.5–1.9)

## 2020-08-28 LAB — VITAMIN D 25 HYDROXY (VIT D DEFICIENCY, FRACTURES): Vit D, 25-Hydroxy: 68.17 ng/mL (ref 30–100)

## 2020-08-28 LAB — PROTIME-INR
INR: 7.4 (ref 0.8–1.2)
INR: 2.3 — ABNORMAL HIGH (ref 0.8–1.2)
Prothrombin Time: 61.3 seconds — ABNORMAL HIGH (ref 11.4–15.2)
Prothrombin Time: 24.5 seconds — ABNORMAL HIGH (ref 11.4–15.2)

## 2020-08-28 LAB — AMMONIA: Ammonia: 63 umol/L — ABNORMAL HIGH (ref 9–35)

## 2020-08-28 LAB — LACTATE DEHYDROGENASE: LDH: 259 U/L — ABNORMAL HIGH (ref 98–192)

## 2020-08-28 LAB — ECHOCARDIOGRAM COMPLETE
Area-P 1/2: 3.93 cm2
Height: 60 in
S' Lateral: 3.3 cm
Weight: 2469.15 oz

## 2020-08-28 LAB — COOXEMETRY PANEL
Carboxyhemoglobin: 0.7 % (ref 0.5–1.5)
Carboxyhemoglobin: 0.8 % (ref 0.5–1.5)
Methemoglobin: 1 % (ref 0.0–1.5)
Methemoglobin: 0.9 % (ref 0.0–1.5)
O2 Saturation: 42.6 %
O2 Saturation: 50.2 %
Total hemoglobin: 10.1 g/dL — ABNORMAL LOW (ref 12.0–16.0)
Total hemoglobin: 11.1 g/dL — ABNORMAL LOW (ref 12.0–16.0)

## 2020-08-28 LAB — BETA 2 MICROGLOBULIN, SERUM: Beta-2 Microglobulin: 14.9 mg/L — ABNORMAL HIGH (ref 0.6–2.4)

## 2020-08-28 LAB — URIC ACID: Uric Acid, Serum: 6.7 mg/dL (ref 2.5–7.1)

## 2020-08-28 MED ORDER — SODIUM CHLORIDE 0.9% FLUSH
10.0000 mL | INTRAVENOUS | Status: DC | PRN
Start: 2020-08-28 — End: 2020-08-29

## 2020-08-28 MED ORDER — PANTOPRAZOLE SODIUM 40 MG PO PACK
40.0000 mg | PACK | Freq: Every day | ORAL | Status: DC
  Administered 2020-08-28 – 2020-08-29 (×2): 40 mg
  Filled 2020-08-28 (×2): qty 20

## 2020-08-28 MED ORDER — LACTULOSE ENEMA
300.0000 mL | Freq: Once | ORAL | Status: AC
  Administered 2020-08-28: 300 mL via RECTAL
  Filled 2020-08-28: qty 300

## 2020-08-28 MED ORDER — VITAMIN K1 10 MG/ML IJ SOLN
2.0000 mg | Freq: Once | INTRAMUSCULAR | Status: DC
  Filled 2020-08-28: qty 0.2

## 2020-08-28 MED ORDER — AMIODARONE HCL IN DEXTROSE 360-4.14 MG/200ML-% IV SOLN
60.0000 mg/h | INTRAVENOUS | Status: DC
  Administered 2020-08-28 – 2020-08-29 (×3): 30 mg/h via INTRAVENOUS
  Filled 2020-08-28 (×3): qty 200

## 2020-08-28 MED ORDER — SODIUM CHLORIDE 0.9% IV SOLUTION: Freq: Once | INTRAVENOUS | Status: DC

## 2020-08-28 MED ORDER — POTASSIUM CHLORIDE 10 MEQ/50ML IV SOLN
10.0000 meq | INTRAVENOUS | Status: AC
  Administered 2020-08-28 (×4): 10 meq via INTRAVENOUS
  Filled 2020-08-28 (×4): qty 50

## 2020-08-28 MED ORDER — NOREPINEPHRINE 16 MG/250ML-% IV SOLN
0.0000 ug/min | INTRAVENOUS | Status: DC
  Administered 2020-08-28: 21:00:00 10 ug/min via INTRAVENOUS
  Administered 2020-08-29: 08:00:00 29 ug/min via INTRAVENOUS
  Filled 2020-08-28 (×2): qty 250

## 2020-08-28 MED ORDER — SODIUM CHLORIDE 0.9% FLUSH
10.0000 mL | Freq: Two times a day (BID) | INTRAVENOUS | Status: DC
  Administered 2020-08-28 (×3): 10 mL

## 2020-08-28 MED ORDER — AMIODARONE IV BOLUS ONLY 150 MG/100ML
150.0000 mg | Freq: Once | INTRAVENOUS | Status: AC
  Administered 2020-08-28: 01:00:00 150 mg via INTRAVENOUS
  Filled 2020-08-28: qty 100

## 2020-08-28 MED ORDER — POTASSIUM CHLORIDE 10 MEQ/50ML IV SOLN
10.0000 meq | INTRAVENOUS | Status: AC
  Administered 2020-08-28 – 2020-08-29 (×3): 10 meq via INTRAVENOUS
  Filled 2020-08-28 (×3): qty 50

## 2020-08-28 MED ORDER — FREE WATER
400.0000 mL | Status: DC
  Administered 2020-08-28 – 2020-08-29 (×4): 400 mL

## 2020-08-28 MED ORDER — VITAMIN K1 10 MG/ML IJ SOLN
2.0000 mg | Freq: Once | INTRAVENOUS | Status: AC
  Administered 2020-08-28: 09:00:00 2 mg via INTRAVENOUS
  Filled 2020-08-28: qty 0.2

## 2020-08-28 MED ORDER — SODIUM CHLORIDE 0.9 % IV SOLN
INTRAVENOUS | Status: DC | PRN
  Administered 2020-08-28: 06:00:00 1000 mL via INTRAVENOUS

## 2020-08-28 MED ORDER — NOREPINEPHRINE 4 MG/250ML-% IV SOLN
10.0000 ug/min | INTRAVENOUS | Status: DC
  Administered 2020-08-28 (×2): 10 ug/min via INTRAVENOUS
  Filled 2020-08-28 (×2): qty 250

## 2020-08-28 MED ORDER — MAGNESIUM SULFATE IN D5W 1-5 GM/100ML-% IV SOLN
1.0000 g | Freq: Once | INTRAVENOUS | Status: AC
  Administered 2020-08-29: 02:00:00 1 g via INTRAVENOUS
  Filled 2020-08-28 (×2): qty 100

## 2020-08-28 MED ORDER — LACTULOSE 10 GM/15ML PO SOLN
20.0000 g | Freq: Three times a day (TID) | ORAL | Status: DC
  Administered 2020-08-28 – 2020-08-29 (×2): 20 g
  Filled 2020-08-28 (×3): qty 30

## 2020-08-28 MED ORDER — AMIODARONE HCL IN DEXTROSE 360-4.14 MG/200ML-% IV SOLN
60.0000 mg/h | INTRAVENOUS | Status: AC
  Administered 2020-08-28 (×2): 60 mg/h via INTRAVENOUS
  Filled 2020-08-28: qty 200

## 2020-08-28 NOTE — Progress Notes (Addendum)
Advanced Heart Failure VAD Team Note  PCP-Cardiologist: No primary care provider on file.   Subjective:    Moved to ICU yesterday. Remains lethargic and tachypenic but will arouse. Initial co-ox yesterday was 49%.CVP 13.   Started on NE 5. AM co-ox pending.   Developed AF with RVR overnight. Now on IV amio  MAPs 60-70s   LVAD INTERROGATION:  HeartMate II LVAD:   Flow 3.3 liters/min, speed 9000, power 4.0, PI 4.0. VAD interrogated personally. Parameters stable.  Objective:    Vital Signs:   Temp:  [97.7 F (36.5 C)-99.8 F (37.7 C)] 97.8 F (36.6 C) (12/26 2338) Pulse Rate:  [92-170] 118 (12/27 0500) Resp:  [17-51] 39 (12/27 0500) BP: (62-133)/(32-96) 77/62 (12/27 0500) SpO2:  [89 %-100 %] 90 % (12/27 0500) Arterial Line BP: (64-107)/(52-88) 66/58 (12/27 0500) Weight:  [70 kg-70.6 kg] 70 kg (12/27 0409) Last BM Date: 08/27/20 Mean arterial Pressure 60-70  Intake/Output:   Intake/Output Summary (Last 24 hours) at 08/28/2020 0617 Last data filed at 08/28/2020 0531 Gross per 24 hour  Intake 726.85 ml  Output 320 ml  Net 406.85 ml     Physical Exam    General:  Ill appearing. Tachypneic. Lethargic but will arouse .  HEENT: normal  Neck: supple. RIJ TLC JVP up Carotids 2+ bilat; no bruits. No lymphadenopathy or thryomegaly appreciated. Cor: LVAD hum.  Lungs: Rhonchorous Abdomen: obese soft, nontender, non-distended. No hepatosplenomegaly. No bruits or masses. Good bowel sounds. Driveline site clean. Anchor in place.  Extremities: no cyanosis, clubbing, rash. Warm no edema  Neuro: lethargic but will arouse  Telemetry   AF 130-140s Personally reviewed   Labs   Basic Metabolic Panel: Recent Labs  Lab 08/05/2020 1300 08/26/20 0035 08/26/20 2138 08/27/20 0435 08/28/20 0233  NA 142 143 143 144 147*  K 4.3 4.3 4.0 3.0* 3.2*  CL 108 108 110 109 109  CO2 22 23 21* 22 23  GLUCOSE 113* 143* 154* 123* 150*  BUN 20 22 28* 28* 45*  CREATININE 1.99* 2.09* 1.87*  2.08* 2.51*  CALCIUM >15.0* 14.4* 11.2* 11.0* 10.8*    Liver Function Tests: Recent Labs  Lab 08/09/2020 1245 08/26/20 0035 08/27/20 0435 08/28/20 0233  AST 36 $Remo'31 28 29  'pbbHH$ ALT $Rem'23 21 20 21  'hDCc$ ALKPHOS 105 94 85 79  BILITOT 1.1 1.2 1.1 1.1  PROT >12.0* 11.5* 10.9* 11.9*  ALBUMIN 2.8* 2.5* 2.4* 2.5*   No results for input(s): LIPASE, AMYLASE in the last 168 hours. No results for input(s): AMMONIA in the last 168 hours.  CBC: Recent Labs  Lab 08/21/20 1551 08/27/2020 1300 08/26/20 0035 08/27/20 0435 08/28/20 0233  WBC 9.1 10.5 9.4 7.0 9.7  NEUTROABS  --  6.0  --   --   --   HGB 10.5* 10.7* 9.5* 9.4* 9.2*  HCT 35.2* 35.9* 31.2* 29.9* 30.7*  MCV 102.6* 103.2* 101.3* 99.7 100.7*  PLT 177 135* 122* 118* 120*    INR: Recent Labs  Lab 08/22/20 0000 08/11/2020 1300 08/26/20 0035 08/27/20 0435 08/28/20 0233  INR 2.4 3.1* 3.4* 4.5* 7.4*    Other results:     Imaging   DG CHEST PORT 1 VIEW  Result Date: 08/27/2020 CLINICAL DATA:  Central line placement. EXAM: PORTABLE CHEST 1 VIEW COMPARISON:  08/26/2020 FINDINGS: Median sternotomy wires are present. Left-sided pacemaker and LVAD unchanged. Interval placement of right IJ central venous catheter which has tip in the region of the cavoatrial junction. Lungs are hypoinflated demonstrate bilateral patchy airspace opacification  slightly worse and may be due to interstitial edema versus multifocal infection. No evidence of pneumothorax. Possible small amount of pleural fluid. Remainder the exam is unchanged. IMPRESSION: 1. Slight interval worsening bilateral patchy airspace process which may be due to interstitial edema versus multifocal infection. Possible small amount of pleural fluid. 2. Right IJ central venous catheter with tip at the cavoatrial junction. No pneumothorax. Electronically Signed   By: Marin Olp M.D.   On: 08/27/2020 18:55   DG CHEST PORT 1 VIEW  Result Date: 08/27/2020 CLINICAL DATA:  Shortness of breath. Status  post left ventricular assist device. EXAM: PORTABLE CHEST 1 VIEW COMPARISON:  1 day prior FINDINGS: Dual lead pacer/AICD device. Prior median sternotomy. Vertebral augmentation within the lower thoracic spine. Left ventricular assist device. Numerous leads and wires project over the chest. Midline trachea. Moderate cardiomegaly. Atherosclerosis in the transverse aorta. Layering bilateral pleural effusions, increased. No pneumothorax. Progressive interstitial and airspace disease, lower lung predominant. IMPRESSION: Worsened aeration, with progressive congestive heart failure and layering bilateral pleural effusions. Bibasilar airspace opacities are increased, most likely atelectasis. Electronically Signed   By: Abigail Miyamoto M.D.   On: 08/27/2020 13:47   DG Chest Port 1 View  Result Date: 08/26/2020 CLINICAL DATA:  Bibasilar crackles. EXAM: PORTABLE CHEST 1 VIEW COMPARISON:  X-ray 1 day prior FINDINGS: The heart size is significantly enlarged. There is a left ventricular assist device in place. There is a biventricular pacemaker. The lung volumes are low. There is vascular congestion with developing pulmonary edema. There are bilateral pleural effusions. IMPRESSION: Growing bilateral pleural effusions, otherwise no significant interval change. Electronically Signed   By: Constance Holster M.D.   On: 08/26/2020 20:44   DG Bone Survey Met  Result Date: 08/26/2020 CLINICAL DATA:  Myeloma EXAM: METASTATIC BONE SURVEY COMPARISON:  Abdomen and pelvis CT 12/26/2019 FINDINGS: Skull: Innumerable lucencies throughout the calvarium. Upper extremities: Large lytic lesion at the right scapula near the neck and at the base of the scapular spine. At least 3 lytic lesions are seen in the right humeral shaft. Spine: Compression fractures have occurred at T10, T12, L1, L2, L3, L4, and L5. Bone augmentation at T10, T12, L3, and L4. Compression fractures have occurred at L2 and L5 since April 2021 CT. There is a lytic  appearance to the T11 body which is attributed to myeloma. Myeloma deposit in the L5 spinous process. Lower extremities: Heterogeneous density in the bilateral femoral diaphyses likely reflecting myeloma. Prominent osteopenia diffusely. Heterogeneous trabeculated in the upper tibia on both sides which is symmetric and indeterminate. No femoral neck fracture or discrete femoral neck erosion. Pelvis: Symmetric osteopenia.  No discrete lesion is seen. Chest and ribs: Cardiomegaly with dual-chamber pacer implant and left ventricular assist device. No pulmonary edema, effusion, or pneumothorax. No resolved rib lesions. IMPRESSION: 1. Widespread myeloma and generalized osteopenia. 2. Compression fractures of T10, T12, and L1-L5. L2 and L5 compression fractures are new from April 2021. Electronically Signed   By: Monte Fantasia M.D.   On: 08/26/2020 09:04     Medications:     Scheduled Medications: . amiodarone  200 mg Oral Daily  . Chlorhexidine Gluconate Cloth  6 each Topical Daily  . dexamethasone (DECADRON) injection  10 mg Intravenous Q24H  . docusate sodium  100 mg Oral Daily  . pantoprazole  40 mg Oral Daily  . phytonadione  2 mg Subcutaneous Once  . sodium chloride flush  10-40 mL Intracatheter Q12H    Infusions: . sodium chloride 1,000  mL (08/28/20 0530)  . amiodarone 60 mg/hr (08/28/20 0500)   Followed by  . amiodarone    . norepinephrine (LEVOPHED) Adult infusion 7 mcg/min (08/28/20 0500)  . potassium chloride 10 mEq (08/28/20 0531)    PRN Medications: sodium chloride, acetaminophen, ondansetron (ZOFRAN) IV, sodium chloride flush   Assessment/Plan:    1. Hypercalcemia, hyperproteinemia and lytic skull lesions in setting of known MGUS -> multiple myeloma - Skeletal survey 12/25: Widespread myeloma lytic lesions and generalized osteopenia. - almost certainly has conversion to multiple myeloma. I d/w Oncology yesterday and they are planning BMBx once hypercalcemia improved. I do  not think she is stable enough for this currently.  - Calcium down to 11.0. Now with pulmonary edema on exam and CXR. IVF held. Getting lasix but with minimal response - continue calcitonin 4 IUs/kg. Received zoledronic acid $RemoveBeforeD'4mg'[id number]$  on 12/24 - Decadron per Oncology - SPEP/UPEP, serum free light chains and beta-2 microglobuilin pending  2. AKI - Creatinine up to 2.5 today. 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 Now on NE. Follow co-ox - May need to get renal involved to help with hypercalcemia and AKI  3. Acute hypoxic respiratory failure - CXR c/w pulmonary edema.  - have given lasix with minimal response.  - Repeat ABG - follow CVP and co-ox  4. Acute on Chronic systolic TM:LYYT, s/p ICD and LVAD for DT(04/2013).  - now > 7 years out on VAD support -  Volume up in setting of hydration for hypercalcemia. Minimal response to lasix.  - initial co-ox 49% NE added. VAD speed increased - minimal pulsativity on A-line.  - check ECHO today  5.  LVAD placed for DT 04/2013:  -VAD interrogated personally. Parameters stable. - No ASA with GI bleed and intolerance.  - INR 3.1-> 3.4 -> 4.5 -> 7.4 - Coumadin has been held since admit. Suspect low flow to liver/shock - INR Goal 1.8-2.3 - given vitamin K $RemoveB'2mg'vIaWWeJJ$  SQ. On 12/26 - will give 2u FFP  6. PAF:  - S/p DC-CV 06/18/19 -BAck in AF overnight. - IV amio started  7.  HTN: - BP low. May need pressors.   8. Metabolic encephalopathy - likely combination of low output, hypercalcemia - will also check ammonia  9. Goals of care - she has multi-system organ failure. - I d/w [name] and she would want full code but given progressive MSOF and underlying myeloma will revisit today - Palliative Care consult.   CRITICAL CARE Performed by: Glori Bickers  Total critical care time: 45 minutes  Critical care time was exclusive of  separately billable procedures and treating other patients.  Critical care was necessary to treat or prevent imminent or life-threatening deterioration.  Critical care was time spent personally by me (independent of midlevel providers or residents) on the following activities: development of treatment plan with patient and/or surrogate as well as nursing, discussions with consultants, evaluation of patient's response to treatment, examination of patient, obtaining history from patient or surrogate, ordering and performing treatments and interventions, ordering and review of laboratory studies, ordering and review of radiographic studies, pulse oximetry and re-evaluation of patient's condition.   I reviewed the LVAD parameters from today, and compared the results to the patient's prior recorded data.  No programming changes were made.  The LVAD is functioning within specified parameters.  The patient performs LVAD self-test daily.  LVAD interrogation was negative  for any significant power changes, alarms or PI events/speed drops.  LVAD equipment check completed and is in good working order.  Back-up equipment present.   LVAD education done on emergency procedures and precautions and reviewed exit site care.   Length of Stay: 3  Glori Bickers, MD 08/28/2020, 6:17 AM  VAD Team --- VAD ISSUES ONLY--- Pager [phone] (7am - 7am)  Advanced Heart Failure Team  Pager [phone] (M-F; 7a - 4p)  Please contact [facility] for night-coverage after hours (4p -7a ) and weekends on [email or web address]

## 2020-08-28 NOTE — Progress Notes (Signed)
Echocardiogram 2D Echocardiogram has been performed.  Brianna Hanson Brianna Hanson 08/28/2020, 1:43 PM

## 2020-08-28 NOTE — Plan of Care (Signed)
  Problem: Clinical Measurements: Goal: Ability to maintain clinical measurements within normal limits will improve Outcome: Progressing Goal: Will remain free from infection Outcome: Progressing Goal: Cardiovascular complication will be avoided Outcome: Progressing   Problem: Coping: Goal: Level of anxiety will decrease Outcome: Progressing   Problem: Elimination: Goal: Will not experience complications related to bowel motility Outcome: Progressing Goal: Will not experience complications related to urinary retention Outcome: Progressing   Problem: Pain Managment: Goal: General experience of comfort will improve Outcome: Progressing   Problem: Safety: Goal: Ability to remain free from injury will improve Outcome: Progressing   Problem: Skin Integrity: Goal: Risk for impaired skin integrity will decrease Outcome: Progressing   Problem: Education: Goal: Knowledge of General Education information will improve Description: Including pain rating scale, medication(s)/side effects and non-pharmacologic comfort measures Outcome: Not Progressing   Problem: Health Behavior/Discharge Planning: Goal: Ability to manage health-related needs will improve Outcome: Not Progressing   Problem: Clinical Measurements: Goal: Diagnostic test results will improve Outcome: Not Progressing Goal: Respiratory complications will improve Outcome: Not Progressing   Problem: Activity: Goal: Risk for activity intolerance will decrease Outcome: Not Progressing   Problem: Nutrition: Goal: Adequate nutrition will be maintained Outcome: Not Progressing

## 2020-08-28 NOTE — Progress Notes (Addendum)
LVAD Coordinator Rounding Note:  Admitted 08/04/2020 per [name] due to weakness and altered mental status.   HM II LVAD implanted on 04/06/13 by Dr. Cyndia Bent under Destination Therapy criteria due to advanced age.   Pt lying in bed with eyes closed, rapid respirations. She opens eyes to verbal stimuli, nods and shakes head to answer questions appropriately. She does confirm pain left shoulder and winces when touched; BS nurse updated.  Brianna Hanson arrived to bedside, he plans on performing dressing change today on DL exit site. Nurse will supply dressing kit.   Nurse reports [name] increased VAD speed to 9000 with low speed limit 8400. Back up controller re-programmed to same set and low speed limit.  Vital signs: Temp:  98.8 HR: 133 A-line BP: 97/77 (85) O2 Sat: 96% on 5 l/[place] Wt: 152>155>154 lbs   LVAD interrogation reveals:  Speed:  9000 Flow:  4.0 Power:  4.6w PI: 6.5 Alarms: none Events:  None  Fixed speed: 9000 Low speed limit: 8400  Drive Line: dressing C/D/I with anchor intact and accurately applied. [name] to perform weekly dressing change today.   Labs:  LDH trend: 246>238>259  INR trend: 3.4>4.5>7.4  Calcium:  15.0>11.2>11.0>10.8  Creat: 1.87>2.0>2.5  Anticoagulation Plan: -INR Goal: 2.0 - 2.5  - on hold  -ASA Dose: none  Blood Products:  - 08/28/20>>2 units FFP  Device: -  St Jude dual ICD - Therapies:  -  VT detection on 171 -  VF therapy on 200 bpm -  Pacing:  DDD 60  Arrythmias:  -  08/28/20>AF with RVR - IV amiodarone  Gtts: - Levophed >>10 mcg/min -  Amiodarone>>60 mg/hr   Plan/Recommendations:  1. Call VAD Coordinator with any VAD equipment or drive line issues. 2. Husband to perform dressing change today.  Zada Girt RN, VAD Coordinator 24/7 VAD pager: [phone]

## 2020-08-28 NOTE — Progress Notes (Signed)
Initial Nutrition Assessment  DOCUMENTATION CODES:   Not applicable  INTERVENTION:   Monitor magnesium, potassium, and phosphorus daily for at least 3 days, MD to replete as needed, as pt is at risk for refeeding syndrome.   Tomorrow start: -Osmolite 1.5 @ 20 ml/hr via Cortrak -Increase by 10 ml Q6 hours to goal rate of 50 ml/hr (1200 ml) -ProSource TF 45 ml TID  Provides: 1920 kcals, 108 grams protein, 914 ml free water.   NUTRITION DIAGNOSIS:   Increased nutrient needs related to acute illness as evidenced by estimated needs.  GOAL:   Patient will meet greater than or equal to 90% of their needs  MONITOR:   Diet advancement,Skin,TF tolerance,Weight trends,Labs,I & O's  REASON FOR ASSESSMENT:   Rounds    ASSESSMENT:   Patient with PMH significant for CHF s/p HM3 LVAD, PAF, IgA MGUS, and multiple myeloma with multiple compression fractures. Presents this admission with AKI and hypercalcemia.   Pt discussed during ICU rounds and with RN.   Patient lethargic. Unable to obtain history at this time. Being kept NPO for biopsy tomorrow. Unable to take PO. INR down, plan to place Cortrak at bedside. Pt is day three without nutrition. Recommend starting TF tomorrow.   Records indicate pt weighed 76.7 kg on 12/9 and 69 kg this admission. Suspect dry wt loss but unable to quantify given CHF hx. Will need to obtain NFPE to assess for malnutrition.   UOP: 320 ml x 24 hrs   Drips: levophed Medications: decadron, colace Labs: Na 147 (L) K 3.2 (L) Cr 2.51- trending up   Diet Order:   Diet Order            Diet NPO time specified Except for: Sips with Meds  Diet effective midnight                 EDUCATION NEEDS:   Not appropriate for education at this time  Skin:  Skin Assessment: Reviewed RN Assessment  Last BM:  12/26  Height:   Ht Readings from Last 1 Encounters:  08/28/2020 5' (1.524 m)    Weight:   Wt Readings from Last 1 Encounters:  08/28/20 70 kg     BMI:  Body mass index is 30.14 kg/m.  Estimated Nutritional Needs:   Kcal:  1800-2000 kcal  Protein:  95-115 grams  Fluid:  >/= 1.5 L/day   Mariana Single RD, LDN Clinical Nutrition Pager listed in [place]

## 2020-08-28 NOTE — Progress Notes (Signed)
IP PROGRESS NOTE  Subjective:   Events that in the last few days and noted.  Patient known to me with history of IgA MGUS that was monitored briefly in 2016 but has not had any follow-up since that time.  She presented with multiorgan failure including hypercalcemia, renal failure in the setting of heart failure as well.  Skeletal survey showed multiple lytic lesions as well as hypercalcemia.  She was transferred to intensive care unit with respiratory failure as well as acute on chronic heart failure.  She remains arousable although mentation is slightly altered.  He does not report any pain at this time she was started on dexamethasone for a total of 4 doses.  Objective:  Vital signs in last 24 hours: Temp:  [97.7 F (36.5 C)-99.8 F (37.7 C)] 98.6 F (37 C) (12/27 0741) Pulse Rate:  [92-170] 128 (12/27 0700) Resp:  [17-51] 31 (12/27 0700) BP: (62-133)/(32-96) 104/82 (12/27 0700) SpO2:  [89 %-100 %] 90 % (12/27 0700) Arterial Line BP: (64-107)/(52-88) 93/75 (12/27 0700) Weight:  [154 lb 5.2 oz (70 kg)-155 lb 10.3 oz (70.6 kg)] 154 lb 5.2 oz (70 kg) (12/27 0409) Weight change: 3 lb 8.4 oz (1.6 kg) Last BM Date: 08/27/20  Intake/Output from previous day: 12/26 0701 - 12/27 0700 In: 726.9 [I.V.:318.6; IV Piggyback:408.3] Out: 320 [Urine:320] General: Ill-appearing but in not in acute distress. Head: Normocephalic atraumatic. Mouth: mucous membranes moist, pharynx normal without lesions Eyes: No scleral icterus.  Pupils are equal and round reactive to light. Cardio: irregular.  GI: soft, non-tender; bowel sounds normal; no masses,  no organomegaly Musculoskeletal: No joint deformity or effusion. Neurological: No motor, sensory deficits.  Intact deep tendon reflexes. Skin: No rashes or lesions.    Lab Results: Recent Labs    08/27/20 0435 08/28/20 0233  WBC 7.0 9.7  HGB 9.4* 9.2*  HCT 29.9* 30.7*  PLT 118* 120*    BMET Recent Labs    08/27/20 0435 08/28/20 0233   NA 144 147*  K 3.0* 3.2*  CL 109 109  CO2 22 23  GLUCOSE 123* 150*  BUN 28* 45*  CREATININE 2.08* 2.51*  CALCIUM 11.0* 10.8*      DG Bone Survey Met  Result Date: 08/26/2020 CLINICAL DATA:  Myeloma EXAM: METASTATIC BONE SURVEY COMPARISON:  Abdomen and pelvis CT 12/26/2019 FINDINGS: Skull: Innumerable lucencies throughout the calvarium. Upper extremities: Large lytic lesion at the right scapula near the neck and at the base of the scapular spine. At least 3 lytic lesions are seen in the right humeral shaft. Spine: Compression fractures have occurred at T10, T12, L1, L2, L3, L4, and L5. Bone augmentation at T10, T12, L3, and L4. Compression fractures have occurred at L2 and L5 since April 2021 CT. There is a lytic appearance to the T11 body which is attributed to myeloma. Myeloma deposit in the L5 spinous process. Lower extremities: Heterogeneous density in the bilateral femoral diaphyses likely reflecting myeloma. Prominent osteopenia diffusely. Heterogeneous trabeculated in the upper tibia on both sides which is symmetric and indeterminate. No femoral neck fracture or discrete femoral neck erosion. Pelvis: Symmetric osteopenia.  No discrete lesion is seen. Chest and ribs: Cardiomegaly with dual-chamber pacer implant and left ventricular assist device. No pulmonary edema, effusion, or pneumothorax. No resolved rib lesions. IMPRESSION: 1. Widespread myeloma and generalized osteopenia. 2. Compression fractures of T10, T12, and L1-L5. L2 and L5 compression fractures are new from April 2021. Electronically Signed   By: Monte Fantasia M.D.   On:  08/26/2020 09:04    Medications: I have reviewed the patient's current medications.  Assessment/Plan:  85 year old woman with:  1.  Multiple myeloma arising from IgA kappa MGUS.   She presented with acute renal failure, lytic bone lesions and hypercalcemia.  The natural course of this disease was reviewed.  This is an incurable malignancy although can  be treated with systemic chemotherapy.  Her clinical status is currently prohibitive to start anticancer therapy beyond dexamethasone.  Treatment with multi agent chemotherapy utilizing Velcade, Revlimid, and other agents are a consideration.  However, she will require much better clinical improvement before we even consider these options.  Bone marrow biopsy is required but she will not be a candidate to receive such a procedure at this time.  If she continues on this trajectory, she will be not be a candidate to receive anticancer therapy.  I think her prognosis is very poor given her multiorgan failure in the setting of a aggressive multiple myeloma with multiple lytic lesions, renal failure and hypercalcemia.   2.  Hypercalcemia: Related to multiple myeloma and appears to have improved at this time.  3.  Renal failure: Multifactorial in nature related to output failure as well as element of multiple myeloma.   4.  Prognosis: Her prognosis is very poor given her multiorgan failure in the setting of aggressive multiple myeloma.  I doubt she will be able to receive anticancer treatment for her multiple myeloma.  I would be in favor of supportive measures only especially if she continues a clinical decline.   I will discuss with her significant other [name] via phone who was not present at bedside today.   35  minutes were dedicated to this visit.  50% of time was face-to-face and dedicated to reviewing laboratory data, imaging studies, discussing treatment options, and reviewing prognosis.   LOS: 3 days   Zola Button 08/28/2020, 8:18 AM

## 2020-08-28 NOTE — Consult Note (Signed)
[facility] Nephrology Consultation Note  Requesting MD: Dr Haroldine Laws Reason for consult: Hypercalcemia  HPI:  Brianna Hanson is a 85 y.o. female with PMH of severe NICM, chronic systolic CHF on chronic LVAD, PAF, recent diagnosis of IgA kappa multiple myeloma, seen as a consultation at the request of [name] for the evaluation of hypercalcemia and AKI.  The patient was admitted on 08/07/2020 for altered mental status, generalized weakness when she was found to have severe hypercalcemia >15 and new diagnosis of multiple myeloma. Prior to this she came to ER on 12/20 for atypical chest pain and dyspnea when she was discharged home. Initially in ER, the labs showed total protein more than 12, lactic acidosis, calcium more than 15 and CT head consistent with multiple lytic lesions.  The serum creatinine level was 1.02 on 12/8 and increased to 1.99 on 12/24. Patient became hypotensive requiring Levophed.  She also developed A. fib with RVR which was treated with IV amiodarone. Seen by oncologist for IgA kappa multiple myeloma.  The skeletal survey on 1225 with widespread myeloma lytic lesions and generalized osteopenia.  The hypercalcemia was initially treated with IV fluid which was held when she developed pulmonary edema.  Subsequently treated with IV Lasix.  Also received calcitonin and zoledronic acid 4 mg on 12/24.  The oncologist is holding bone marrow biopsy and not planning aggressive chemotherapy because of her deteriorating clinical condition and poor prognosis.  However, the calcium level continue to trend down to 10.8 today. Today, the creatinine level went up to 2.51, BUN 45, potassium 3.2.  The repeat lab showed sodium level of 150 and potassium 3.6. The patient is currently lying in bed, somewhat confused however following simple commands.  Nursing staff attempting to place nasal feeding tube.  On amiodarone, Levophed with acceptable BP.  Urine output around 625 cc. At  home, she was on losartan 25 mg and Lasix 20 mg as per her cardiologist.  Creatinine, Ser  Date/Time Value Ref Range Status  08/28/2020 02:33 AM 2.51 (H) 0.44 - 1.00 mg/dL Final  08/27/2020 04:35 AM 2.08 (H) 0.44 - 1.00 mg/dL Final  08/26/2020 09:38 PM 1.87 (H) 0.44 - 1.00 mg/dL Final  08/26/2020 12:35 AM 2.09 (H) 0.44 - 1.00 mg/dL Final  08/30/2020 01:00 PM 1.99 (H) 0.44 - 1.00 mg/dL Final  08/21/2020 03:51 PM 1.32 (H) 0.44 - 1.00 mg/dL Final  08/09/2020 10:55 AM 1.02 (H) 0.44 - 1.00 mg/dL Final  06/08/2020 10:21 AM 0.96 0.44 - 1.00 mg/dL Final  04/13/2020 10:13 AM 1.09 (H) 0.44 - 1.00 mg/dL Final  02/17/2020 09:56 AM 0.91 0.44 - 1.00 mg/dL Final    PMHx:   Past Medical History:  Diagnosis Date  . AICD (automatic cardioverter/defibrillator) present   . Anemia   . Atrial fibrillation or flutter   . Cardiac arrest - ventricular fibrillation 12/10   with successful resucitation, S/p ICD  . CHF (congestive heart failure) ([place])   . Diverticula of colon 2011  . HTN (hypertension)    moderate  . ICD (implantable cardiac defibrillator) in place    she has received appropriate therapy for VF  . Internal and external hemorrhoids without complication 1610  . Nonischemic cardiomyopathy ([place])    followed by Dr April Holding at [facility]  . Osteopenia   . Plasma cell disorder 03/20/2012  . Plasma cell disorder 03/20/2012  . Sessile colonic polyp 2011   Dr Benson Norway    Past Surgical History:  Procedure Laterality Date  . ABDOMINAL HYSTERECTOMY    .  CARDIAC CATHETERIZATION    . CARDIAC DEFIBRILLATOR PLACEMENT     by Fawn Kirk for secondary prevention of sudden death  . CARDIOVERSION N/A 06/29/19   Procedure: CARDIOVERSION;  Surgeon: Dolores Patty, MD;  [facility] ENDOSCOPY;  Service: Cardiovascular;  Laterality: N/A;  . CHOLECYSTECTOMY N/A 10/09/2015   Procedure: LAPAROSCOPIC CHOLECYSTECTOMY;  Surgeon: Emelia Loron, MD;  Location: [facility] OR;  Service: General;  Laterality: N/A;  . COLONOSCOPY  W/ POLYPECTOMY  2011   Dr Elnoria Howard  . INSERTION OF IMPLANTABLE LEFT VENTRICULAR ASSIST DEVICE N/A 04/06/2013   Procedure: INSERTION OF IMPLANTABLE LEFT VENTRICULAR ASSIST DEVICE;  Surgeon: Alleen Borne, MD;  Location: [facility] OR;  Service: Open Heart Surgery;  Laterality: N/A;  . INTRAOPERATIVE TRANSESOPHAGEAL ECHOCARDIOGRAM N/A 04/06/2013   Procedure: INTRAOPERATIVE TRANSESOPHAGEAL ECHOCARDIOGRAM;  Surgeon: Alleen Borne, MD;  Location: [facility] OR;  Service: Open Heart Surgery;  Laterality: N/A;  . IR GENERIC HISTORICAL  09/24/2016   IR US GUIDE VASC ACCESS RIGHT 09/24/2016 Berdine Dance, MD [facility]-INTERV RAD  . IR GENERIC HISTORICAL  09/24/2016   IR ANGIOGRAM VISCERAL SELECTIVE 09/24/2016 Berdine Dance, MD [facility]  . IR GENERIC HISTORICAL  09/24/2016   IR ANGIOGRAM VISCERAL SELECTIVE 09/24/2016 Berdine Dance, MD [facility]  . IR VERTEBROPLASTY EA ADDL (T&LS) BX INC UNI/BIL INC INJECT/IMAGING  12/29/2019  . IR VERTEBROPLASTY EA ADDL (T&LS) BX INC UNI/BIL INC INJECT/IMAGING  12/29/2019  . IR VERTEBROPLASTY EA ADDL (T&LS) BX INC UNI/BIL INC INJECT/IMAGING  12/29/2019  . IR VERTEBROPLASTY LUMBAR BX INC UNI/BIL INC/INJECT/IMAGING  12/29/2019  . PLACEMENT OF CENTRIMAG VENTRICULAR ASSIST DEVICE Right 04/06/2013   Procedure: PLACEMENT OF CENTRIMAG VENTRICULAR ASSIST DEVICE;  Surgeon: Alleen Borne, MD;  Location: [facility] OR;  Service: Open Heart Surgery;  Laterality: Right;  . RADIOLOGY WITH ANESTHESIA N/A 12/29/2019   Procedure: KYPHOPLASTY;  Surgeon: Julieanne Cotton, MD;  Location: [facility] OR;  Service: Radiology;  Laterality: N/A;    Family Hx:  Family History  Problem Relation Age of Onset  . Stroke Mother   . Stroke Brother     Social History:  reports that she has never smoked. She has never used smokeless tobacco. She reports current alcohol use. She reports that she does not use drugs.  Allergies:  Allergies  Allergen Reactions  . Oxycodone Other (See Comments)    Hallucinations     Medications: Prior  to Admission medications   Medication Sig Start Date End Date Taking? Authorizing Provider  amiodarone (PACERONE) 200 MG tablet TAKE 1 TABLET BY MOUTH EVERY DAY Patient taking differently: Take 200 mg by mouth daily. 03/29/20  Yes [name], [name], MD  calcitonin, salmon, (MIACALCIN/FORTICAL) 200 UNIT/ACT nasal spray Place 1 spray into alternate nostrils daily. 02/25/20  Yes [name], [name], MD  carvedilol (COREG) 6.25 MG tablet TAKE 1 TABLET BY MOUTH 2 TIMES DAILY WITH A MEAL Patient taking differently: Take 6.25 mg by mouth 2 (two) times daily with a meal. 07/10/20  Yes [name], [name], MD  Cholecalciferol (VITAMIN D3) 25 MCG (1000 UT) CAPS Take 1,000 Units by mouth daily.    Yes [provider]  docusate sodium (COLACE) 100 MG capsule Take 100 mg by mouth daily.   Yes [provider]  doxycycline (VIBRAMYCIN) 50 MG capsule Take 2 capsules (100 mg total) by mouth 2 (two) times daily. 08/22/20  Yes [name], [name], MD  ferrous sulfate 325 (65 FE) MG tablet Take 1 tablet (325 mg total) by mouth 2 (two) times daily with a meal.  07/11/15  Yes [name], Shaune Pascal, MD  furosemide (LASIX) 20 MG tablet Take 20 mg by mouth daily as needed for fluid or edema.   Yes [provider]  gabapentin (NEURONTIN) 100 MG capsule Take 3 capsules (300 mg total) by mouth 2 (two) times daily. Patient taking differently: Take 100 mg by mouth in the morning and at bedtime. 10/25/19  Yes [name], Shaune Pascal, MD  hydrALAZINE (APRESOLINE) 100 MG tablet Take 0.5 tablets (50 mg total) by mouth 3 (three) times daily. 01/07/20  Yes Clegg, Amy D, NP  losartan (COZAAR) 50 MG tablet Take 1 tablet (50 mg total) by mouth daily. Patient taking differently: Take 25 mg by mouth daily. 04/13/20  Yes [name], Shaune Pascal, MD  magnesium oxide (MAG-OX) 400 (241.3 Mg) MG tablet Take 0.5 tablets (200 mg total) by mouth 2 (two) times daily. 02/17/20  Yes [name], Shaune Pascal, MD  pantoprazole (PROTONIX) 40 MG  tablet TAKE 1 TABLET BY MOUTH EVERY DAY Patient taking differently: Take 40 mg by mouth daily as needed (for reflux). 06/26/20  Yes [name], Shaune Pascal, MD  potassium chloride SA (KLOR-CON M20) 20 MEQ tablet Take 1 tablet (20 mEq total) by mouth 2 (two) times daily. 08/16/19  Yes [name], Shaune Pascal, MD  traMADol (ULTRAM) 50 MG tablet Take 2 tablets (100 mg total) by mouth every 12 (twelve) hours as needed for moderate pain. 06/08/20  Yes [name], Shaune Pascal, MD  warfarin (COUMADIN) 2.5 MG tablet Take 5 mg (2 tabs) every Monday and 2.5 mg (1 tab) all other days or as directed by HF Clinic Patient taking differently: Take 2.5-5 mg by mouth See admin instructions. Take 1 tablet (2.5 mg totally) by mouth in the evening on Sun/Tues/Wed/Thurs/Fri/Sat and except 2 tablets ( 5 mg totally) by mouth on Mon 03/14/20  Yes Orma Render, RPH-CPP  ondansetron (ZOFRAN) 4 MG tablet Take 1 tablet (4 mg total) by mouth every 8 (eight) hours as needed for nausea or vomiting. 12/22/19   [name], Shaune Pascal, MD  promethazine (PHENERGAN) 12.5 MG tablet Take 1 tablet (12.5 mg total) by mouth every 6 (six) hours as needed for nausea or vomiting. 12/22/19   [name], Shaune Pascal, MD    I have reviewed the patient's current medications.  Labs:  Results for orders placed or performed during the hospital encounter of 08/19/2020 (from the past 48 hour(s))  Glucose, capillary     Status: Abnormal   Collection Time: 08/26/20  9:20 PM  Result Value Ref Range   Glucose-Capillary 144 (H) 70 - 99 mg/dL    Comment: Glucose reference range applies only to samples taken after fasting for at least 8 hours.  Basic metabolic panel     Status: Abnormal   Collection Time: 08/26/20  9:38 PM  Result Value Ref Range   Sodium 143 135 - 145 mmol/L   Potassium 4.0 3.5 - 5.1 mmol/L   Chloride 110 98 - 111 mmol/L   CO2 21 (L) 22 - 32 mmol/L   Glucose, Bld 154 (H) 70 - 99 mg/dL    Comment: Glucose reference range applies only to samples taken  after fasting for at least 8 hours.   BUN 28 (H) 8 - 23 mg/dL   Creatinine, Ser 1.87 (H) 0.44 - 1.00 mg/dL   Calcium 11.2 (H) 8.9 - 10.3 mg/dL   GFR, Estimated 26 (L) >60 mL/min    Comment: (NOTE) Calculated using the CKD-EPI Creatinine Equation (2021)    Anion gap 12 5 -  15    Comment: Performed at [facility], [place] [address] [place], [place] [address]  Lactate dehydrogenase     Status: Abnormal   Collection Time: 08/27/20  4:35 AM  Result Value Ref Range   LDH 238 (H) 98 - 192 U/L    Comment: Performed at [facility], [place] [address] [place], [place] [address]  Protime-INR     Status: Abnormal   Collection Time: 08/27/20  4:35 AM  Result Value Ref Range   Prothrombin Time 41.4 (H) 11.4 - 15.2 seconds   INR 4.5 (HH) 0.8 - 1.2    Comment: REPEATED TO VERIFY CRITICAL RESULT CALLED TO, READ BACK BY AND VERIFIED WITH: ROSELLE ZARSONA, RN 08/27/20 0543 BTAYLOR (NOTE) INR goal varies based on device and disease states. Performed at [facility], [place] [address], [place], [place] [address]   CBC     Status: Abnormal   Collection Time: 08/27/20  4:35 AM  Result Value Ref Range   WBC 7.0 4.0 - 10.5 K/uL   RBC 3.00 (L) 3.87 - 5.11 MIL/uL   Hemoglobin 9.4 (L) 12.0 - 15.0 g/dL   HCT 29.9 (L) 36.0 - 46.0 %   MCV 99.7 80.0 - 100.0 fL   MCH 31.3 26.0 - 34.0 pg   MCHC 31.4 30.0 - 36.0 g/dL   RDW 15.9 (H) 11.5 - 15.5 %   Platelets 118 (L) 150 - 400 K/uL    Comment: Immature Platelet Fraction may be clinically indicated, consider ordering this additional test [id number]    nRBC 1.3 (H) 0.0 - 0.2 %    Comment: Performed at [facility], [place] [address] [place], [place] [address]  Comprehensive metabolic panel     Status: Abnormal   Collection Time: 08/27/20  4:35 AM  Result Value Ref Range   Sodium 144 135 - 145 mmol/L   Potassium 3.0 (L) 3.5 - 5.1 mmol/L   Chloride 109 98 - 111 mmol/L   CO2 22 22 - 32 mmol/L   Glucose, Bld 123 (H) 70 - 99 mg/dL     Comment: Glucose reference range applies only to samples taken after fasting for at least 8 hours.   BUN 28 (H) 8 - 23 mg/dL   Creatinine, Ser 2.08 (H) 0.44 - 1.00 mg/dL   Calcium 11.0 (H) 8.9 - 10.3 mg/dL   Total Protein 10.9 (H) 6.5 - 8.1 g/dL   Albumin 2.4 (L) 3.5 - 5.0 g/dL   AST 28 15 - 41 U/L   ALT 20 0 - 44 U/L   Alkaline Phosphatase 85 38 - 126 U/L   Total Bilirubin 1.1 0.3 - 1.2 mg/dL   GFR, Estimated 23 (L) >60 mL/min    Comment: (NOTE) Calculated using the CKD-EPI Creatinine Equation (2021)    Anion gap 13 5 - 15    Comment: Performed at [facility], [place] [address] [place], [place] [address]  Uric acid     Status: Abnormal   Collection Time: 08/27/20  4:35 AM  Result Value Ref Range   Uric Acid, Serum 14.5 (H) 2.5 - 7.1 mg/dL    Comment: Performed at [facility] [address] [place], [place] [address]  Blood gas, arterial     Status: Abnormal   Collection Time: 08/27/20  1:45 PM  Result Value Ref Range   FIO2 48.00    pH, Arterial 7.482 (H) 7.350 - 7.450   pCO2 arterial 29.1 (L)  32.0 - 48.0 mmHg   pO2, Arterial 141 (H) 83.0 - 108.0 mmHg   Bicarbonate 21.5 20.0 - 28.0 mmol/L   Acid-base deficit 1.5 0.0 - 2.0 mmol/L   O2 Saturation 98.7 %   Patient temperature 37.0    Collection site RIGHT RADIAL    Drawn by [id number]     Comment: DRAWN BY RN   Sample type ARTERIAL DRAW    Allens test (pass/fail) PASS PASS    Comment: Performed at [facility], [place] [address], [place], [place] [address]  .Cooxemetry Panel (carboxy, met, total hgb, O2 sat)     Status: Abnormal   Collection Time: 08/27/20  6:55 PM  Result Value Ref Range   Total hemoglobin 10.0 (L) 12.0 - 16.0 g/dL   O2 Saturation 49.6 %   Carboxyhemoglobin 0.7 0.5 - 1.5 %   Methemoglobin 0.9 0.0 - 1.5 %    Comment: Performed at [facility] [address] [place], [place] [address]  Glucose, capillary     Status: Abnormal   Collection Time: 08/27/20 11:33 PM  Result Value Ref Range    Glucose-Capillary 120 (H) 70 - 99 mg/dL    Comment: Glucose reference range applies only to samples taken after fasting for at least 8 hours.  Lactate dehydrogenase     Status: Abnormal   Collection Time: 08/28/20  2:33 AM  Result Value Ref Range   LDH 259 (H) 98 - 192 U/L    Comment: Performed at [facility], [place] [address] [place], [place] [address]  Protime-INR     Status: Abnormal   Collection Time: 08/28/20  2:33 AM  Result Value Ref Range   Prothrombin Time 61.3 (H) 11.4 - 15.2 seconds   INR 7.4 (HH) 0.8 - 1.2    Comment: REPEATED TO VERIFY CRITICAL RESULT CALLED TO, READ BACK BY AND VERIFIED WITH: Montey Hora, RN 08/28/20 0515 BTAYLOR (NOTE) INR goal varies based on device and disease states. Performed at [facility], [place] [address] [place], [place] [address]   CBC     Status: Abnormal   Collection Time: 08/28/20  2:33 AM  Result Value Ref Range   WBC 9.7 4.0 - 10.5 K/uL   RBC 3.05 (L) 3.87 - 5.11 MIL/uL   Hemoglobin 9.2 (L) 12.0 - 15.0 g/dL   HCT 30.7 (L) 36.0 - 46.0 %   MCV 100.7 (H) 80.0 - 100.0 fL   MCH 30.2 26.0 - 34.0 pg   MCHC 30.0 30.0 - 36.0 g/dL   RDW 16.0 (H) 11.5 - 15.5 %   Platelets 120 (L) 150 - 400 K/uL   nRBC 2.0 (H) 0.0 - 0.2 %    Comment: Performed at [facility], [place] [address] [place], [place] [address]  Comprehensive metabolic panel     Status: Abnormal   Collection Time: 08/28/20  2:33 AM  Result Value Ref Range   Sodium 147 (H) 135 - 145 mmol/L   Potassium 3.2 (L) 3.5 - 5.1 mmol/L   Chloride 109 98 - 111 mmol/L   CO2 23 22 - 32 mmol/L   Glucose, Bld 150 (H) 70 - 99 mg/dL    Comment: Glucose reference range applies only to samples taken after fasting for at least 8 hours.   BUN 45 (H) 8 - 23 mg/dL   Creatinine, Ser 2.51 (H) 0.44 - 1.00 mg/dL   Calcium 10.8 (H) 8.9 - 10.3 mg/dL   Total Protein 11.9 (H)  6.5 - 8.1 g/dL   Albumin 2.5 (L) 3.5 - 5.0 g/dL   AST 29 15 - 41 U/L   ALT 21 0 - 44 U/L   Alkaline  Phosphatase 79 38 - 126 U/L   Total Bilirubin 1.1 0.3 - 1.2 mg/dL   GFR, Estimated 19 (L) >60 mL/min    Comment: (NOTE) Calculated using the CKD-EPI Creatinine Equation (2021)    Anion gap 15 5 - 15    Comment: Performed at [facility] [address] [place], [place] [address]  Uric acid     Status: None   Collection Time: 08/28/20  2:33 AM  Result Value Ref Range   Uric Acid, Serum 6.7 2.5 - 7.1 mg/dL    Comment: Performed at [facility] [address], [place], [place] [address]  VITAMIN D 25 Hydroxy (Vit-D Deficiency, Fractures)     Status: None   Collection Time: 08/28/20  2:33 AM  Result Value Ref Range   Vit D, 25-Hydroxy 68.17 30 - 100 ng/mL    Comment: (NOTE) Vitamin D deficiency has been defined by the [place] practice guideline as a level of serum 25-OH  vitamin D less than 20 ng/mL (1,2). The Endocrine Society went on to  further define vitamin D insufficiency as a level between 21 and 29  ng/mL (2).  1. IOM (Institute of Medicine). 2010. Dietary reference intakes for  calcium and D. [place]: The [employer]. 2. Holick MF, Binkley Sellers, Bischoff-Ferrari HA, et al. Evaluation,  treatment, and prevention of vitamin D deficiency: an Endocrine  Society clinical practice guideline, JCEM. 2011 Jul; 96(7): 1911-30.  Performed at [facility], [place] [address], [place], [place] [address]   .Cooxemetry Panel (carboxy, met, total hgb, O2 sat)     Status: Abnormal   Collection Time: 08/28/20  6:12 AM  Result Value Ref Range   Total hemoglobin 10.1 (L) 12.0 - 16.0 g/dL   O2 Saturation 42.6 %   Carboxyhemoglobin 0.7 0.5 - 1.5 %   Methemoglobin 1.0 0.0 - 1.5 %    Comment: Performed at [facility], [place] [address], [place], [place] [address]  Lactic acid, plasma     Status: Abnormal   Collection Time: 08/28/20  6:20 AM  Result Value Ref Range   Lactic Acid, Venous 2.0 (HH) 0.5 - 1.9 mmol/L     Comment: CRITICAL VALUE NOTED.  VALUE IS CONSISTENT WITH PREVIOUSLY REPORTED AND CALLED VALUE. Performed at [facility], [place] [address] [place], [place] [address]   I-STAT 7, (LYTES, BLD GAS, ICA, H+H)     Status: Abnormal   Collection Time: 08/28/20  6:25 AM  Result Value Ref Range   pH, Arterial 7.494 (H) 7.350 - 7.450   pCO2 arterial 28.7 (L) 32.0 - 48.0 mmHg   pO2, Arterial 67 (L) 83.0 - 108.0 mmHg   Bicarbonate 22.1 20.0 - 28.0 mmol/L   TCO2 23 22 - 32 mmol/L   O2 Saturation 95.0 %   Acid-base deficit 1.0 0.0 - 2.0 mmol/L   Sodium 150 (H) 135 - 145 mmol/L   Potassium 3.6 3.5 - 5.1 mmol/L   Calcium, Ion 1.38 1.15 - 1.40 mmol/L   HCT 29.0 (L) 36.0 - 46.0 %   Hemoglobin 9.9 (L) 12.0 - 15.0 g/dL   Sample type ARTERIAL   Ammonia     Status: Abnormal   Collection Time: 08/28/20  6:39  AM  Result Value Ref Range   Ammonia 63 (H) 9 - 35 umol/L    Comment: Performed at [facility], [place] [address] [place], [place] [address]  Prepare fresh frozen plasma     Status: None (Preliminary result)   Collection Time: 08/28/20  7:00 AM  Result Value Ref Range   Unit Number [id number]    Blood Component Type THAWED PLASMA    Unit division 00    Status of Unit ISSUED    Transfusion Status OK TO TRANSFUSE    Unit Number [id number]    Blood Component Type THAWED PLASMA    Unit division 00    Status of Unit ISSUED    Transfusion Status      OK TO TRANSFUSE Performed at [facility] [address] [place], [place] [address]   Lactic acid, plasma     Status: Abnormal   Collection Time: 08/28/20  8:08 AM  Result Value Ref Range   Lactic Acid, Venous 2.2 (HH) 0.5 - 1.9 mmol/L    Comment: CRITICAL VALUE NOTED.  VALUE IS CONSISTENT WITH PREVIOUSLY REPORTED AND CALLED VALUE. Performed at [facility], [place] [address] [place], [place] [address]   Protime-INR     Status: Abnormal   Collection Time: 08/28/20  2:14 PM  Result Value Ref Range   Prothrombin Time  24.5 (H) 11.4 - 15.2 seconds   INR 2.3 (H) 0.8 - 1.2    Comment: (NOTE) INR goal varies based on device and disease states. Performed at [facility], [place] [address] [place], [place] [address]    *Note: Due to a large number of results and/or encounters for the requested time period, some results have not been displayed. A complete set of results can be found in Results Review.     ROS: Review of system is limited as patient is very weak and confused.  Physical Exam: Vitals:   08/28/20 1500 08/28/20 1516  BP:    Pulse: 86   Resp: (!) 24   Temp:  97.7 F (36.5 C)  SpO2: 92%      General exam: Critically ill looking female lying on bed, confused Respiratory system: Basal rhonchi, respiratory effort normal. Cardiovascular system: S1 & S2 heard, RRR.  No pedal edema. Gastrointestinal system: Abdomen is nondistended, soft. Normal bowel sounds heard. Central nervous system: Alert and minimally following commands.  Confused Extremities: No cyanosis, no peripheral edema Skin: No rashes, lesions or ulcers Psychiatry: Unable to assess  Assessment/Plan:  #Severe hypercalcemia due to multiple myeloma: X-ray with widespread osteolytic lesions and generalized osteopenia.  Treated with IV fluid, Lasix which is on hold now.  Also received calcitonin and zoledronic acid 4 mg on 12/24.  The calcium level continued to improve to 10.8 today.  She is also on Decadron for oncology. Check daily lab, no additional dose for bisphosphonate today.  #Acute kidney injury multifactorial etiology including severe hypercalcemia, cardiogenic shock on Levophed and hemodynamic instability with A. fib with RVR: Patient is nonoliguric.  Urinalysis with minimal protein but no cells.  The creatinine level was fairly stable in the beginning of this month therefore do not think overt myeloma kidney. Per oncology, the malignancy is not curable and given her current situation, she is not a candidate to receive  anticancer therapy.  Given her overall poor prognosis due to multiorgan failure, I do not think she is a candidate for dialysis treatment.  #Hypernatremia: She received IV fluid in  the beginning for hypercalcemia and then loop diuretics.  Currently on hold.  She is getting nasal feeding tube today therefore I will order free water.  However, need to be very careful for fluid overload.  Difficult situation because of vulnerable to fluid overload and management of electrolytes.   #Hypokalemia: Repleted potassium chloride with improvement.  #Multiple myeloma, IgA kappa: Presented with generalized lytic bone lesions, renal failure, hypercalcemia.  Currently on a steroid.  Treatment of hypercalcemia as above.  Poor prognosis per oncology.  #Acute hypoxic respiratory failure: Received IV Lasix which is on hold.  On oxygen.  #Acute on chronic systolic CHF: Follows closely with cardiology.  She has LVAD.  Thank you for the consult.  We will follow with you.   Jonice Cerra Tanna Furry 08/28/2020, 3:36 PM  [facility].

## 2020-08-28 NOTE — Procedures (Signed)
Cortrak  Person Inserting Tube:  Brianna Hanson, RD Tube Type:  Cortrak - 43 inches Initial Placement:  Stomach Secured by: Bridle Technique Used to Measure Tube Placement:  Documented cm marking at nare/ corner of mouth Cortrak Secured At:  62 cm    Cortrak Tube Team Note:  Consult received to place a Cortrak feeding tube.   Pt with LVAD. X-ray is required, abdominal x-ray has been ordered by the Cortrak team. Please confirm tube placement before using the Cortrak tube.   If the tube becomes dislodged please keep the tube and contact the Cortrak team at [email or web address] ([email or web address]) for replacement.  If after hours and replacement cannot be delayed, place a NG tube and confirm placement with an abdominal x-ray.   Brianna Starcher MS, RDN, LDN, CNSC Registered Dietitian III Clinical Nutrition RD Pager and On-Call Pager Number Located in [place]

## 2020-08-28 NOTE — Progress Notes (Signed)
RN made Brianna Gross, MD aware of pt HR 130-150 post Amio Bolus. MD advised to continue to wean off Levo, however, RN made MD aware that patients Brianna Hanson is positional and waveform is dampened. RN made MD aware that patient is hard to doppler so validity of MAPs and Modified Systolic Pressures may vary. MD noted to continue titrating Levo accordingly and depending on INR results MD will determine the need for new Brianna Hanson.

## 2020-08-29 ENCOUNTER — Inpatient Hospital Stay (HOSPITAL_COMMUNITY): Payer: Medicare Other

## 2020-08-29 ENCOUNTER — Other Ambulatory Visit (HOSPITAL_COMMUNITY): Payer: Medicare Other

## 2020-08-29 DIAGNOSIS — Z515 Encounter for palliative care: Secondary | ICD-10-CM

## 2020-08-29 DIAGNOSIS — Z7189 Other specified counseling: Secondary | ICD-10-CM

## 2020-08-29 DIAGNOSIS — I5043 Acute on chronic combined systolic (congestive) and diastolic (congestive) heart failure: Secondary | ICD-10-CM

## 2020-08-29 DIAGNOSIS — Z66 Do not resuscitate: Secondary | ICD-10-CM

## 2020-08-29 LAB — CBC WITH DIFFERENTIAL/PLATELET
Abs Immature Granulocytes: 0.34 10*3/uL — ABNORMAL HIGH (ref 0.00–0.07)
Basophils Absolute: 0 10*3/uL (ref 0.0–0.1)
Basophils Relative: 0 %
Eosinophils Absolute: 0 10*3/uL (ref 0.0–0.5)
Eosinophils Relative: 0 %
HCT: 26.6 % — ABNORMAL LOW (ref 36.0–46.0)
Hemoglobin: 8.3 g/dL — ABNORMAL LOW (ref 12.0–15.0)
Immature Granulocytes: 5 %
Lymphocytes Relative: 14 %
Lymphs Abs: 1 10*3/uL (ref 0.7–4.0)
MCH: 31.6 pg (ref 26.0–34.0)
MCHC: 31.2 g/dL (ref 30.0–36.0)
MCV: 101.1 fL — ABNORMAL HIGH (ref 80.0–100.0)
Monocytes Absolute: 0.8 10*3/uL (ref 0.1–1.0)
Monocytes Relative: 11 %
Neutro Abs: 5.1 10*3/uL (ref 1.7–7.7)
Neutrophils Relative %: 70 %
Platelets: 119 10*3/uL — ABNORMAL LOW (ref 150–400)
RBC: 2.63 MIL/uL — ABNORMAL LOW (ref 3.87–5.11)
RDW: 16.1 % — ABNORMAL HIGH (ref 11.5–15.5)
WBC: 7.2 10*3/uL (ref 4.0–10.5)
nRBC: 4.8 % — ABNORMAL HIGH (ref 0.0–0.2)

## 2020-08-29 LAB — POCT I-STAT 7, (LYTES, BLD GAS, ICA,H+H)
Acid-base deficit: 3 mmol/L — ABNORMAL HIGH (ref 0.0–2.0)
Acid-base deficit: 4 mmol/L — ABNORMAL HIGH (ref 0.0–2.0)
Acid-base deficit: 3 mmol/L — ABNORMAL HIGH (ref 0.0–2.0)
Bicarbonate: 21.8 mmol/L (ref 20.0–28.0)
Bicarbonate: 23.3 mmol/L (ref 20.0–28.0)
Bicarbonate: 22.3 mmol/L (ref 20.0–28.0)
Calcium, Ion: 1.31 mmol/L (ref 1.15–1.40)
Calcium, Ion: 1.41 mmol/L — ABNORMAL HIGH (ref 1.15–1.40)
Calcium, Ion: 1.37 mmol/L (ref 1.15–1.40)
HCT: 27 % — ABNORMAL LOW (ref 36.0–46.0)
HCT: 32 % — ABNORMAL LOW (ref 36.0–46.0)
HCT: 27 % — ABNORMAL LOW (ref 36.0–46.0)
Hemoglobin: 10.9 g/dL — ABNORMAL LOW (ref 12.0–15.0)
Hemoglobin: 9.2 g/dL — ABNORMAL LOW (ref 12.0–15.0)
Hemoglobin: 9.2 g/dL — ABNORMAL LOW (ref 12.0–15.0)
O2 Saturation: 94 %
O2 Saturation: 98 %
O2 Saturation: 98 %
Patient temperature: 100.8
Patient temperature: 99.4
Potassium: 3.3 mmol/L — ABNORMAL LOW (ref 3.5–5.1)
Potassium: 3.5 mmol/L (ref 3.5–5.1)
Potassium: 3.4 mmol/L — ABNORMAL LOW (ref 3.5–5.1)
Sodium: 145 mmol/L (ref 135–145)
Sodium: 147 mmol/L — ABNORMAL HIGH (ref 135–145)
Sodium: 147 mmol/L — ABNORMAL HIGH (ref 135–145)
TCO2: 23 mmol/L (ref 22–32)
TCO2: 25 mmol/L (ref 22–32)
TCO2: 24 mmol/L (ref 22–32)
pCO2 arterial: 41.3 mmHg (ref 32.0–48.0)
pCO2 arterial: 52.2 mmHg — ABNORMAL HIGH (ref 32.0–48.0)
pCO2 arterial: 39.3 mmHg (ref 32.0–48.0)
pH, Arterial: 7.265 — ABNORMAL LOW (ref 7.350–7.450)
pH, Arterial: 7.333 — ABNORMAL LOW (ref 7.350–7.450)
pH, Arterial: 7.363 (ref 7.350–7.450)
pO2, Arterial: 122 mmHg — ABNORMAL HIGH (ref 83.0–108.0)
pO2, Arterial: 76 mmHg — ABNORMAL LOW (ref 83.0–108.0)
pO2, Arterial: 107 mmHg (ref 83.0–108.0)

## 2020-08-29 LAB — COMPREHENSIVE METABOLIC PANEL
ALT: 33 U/L (ref 0–44)
ALT: 48 U/L — ABNORMAL HIGH (ref 0–44)
AST: 52 U/L — ABNORMAL HIGH (ref 15–41)
AST: 73 U/L — ABNORMAL HIGH (ref 15–41)
Albumin: 2.6 g/dL — ABNORMAL LOW (ref 3.5–5.0)
Albumin: 2.6 g/dL — ABNORMAL LOW (ref 3.5–5.0)
Alkaline Phosphatase: 81 U/L (ref 38–126)
Alkaline Phosphatase: 78 U/L (ref 38–126)
Anion gap: 14 (ref 5–15)
Anion gap: 14 (ref 5–15)
BUN: 47 mg/dL — ABNORMAL HIGH (ref 8–23)
BUN: 47 mg/dL — ABNORMAL HIGH (ref 8–23)
CO2: 21 mmol/L — ABNORMAL LOW (ref 22–32)
CO2: 20 mmol/L — ABNORMAL LOW (ref 22–32)
Calcium: 9.8 mg/dL (ref 8.9–10.3)
Calcium: 9.6 mg/dL (ref 8.9–10.3)
Chloride: 107 mmol/L (ref 98–111)
Chloride: 108 mmol/L (ref 98–111)
Creatinine, Ser: 2.23 mg/dL — ABNORMAL HIGH (ref 0.44–1.00)
Creatinine, Ser: 2.17 mg/dL — ABNORMAL HIGH (ref 0.44–1.00)
GFR, Estimated: 21 mL/min — ABNORMAL LOW (ref >60)
GFR, Estimated: 22 mL/min — ABNORMAL LOW (ref >60)
Glucose, Bld: 172 mg/dL — ABNORMAL HIGH (ref 70–99)
Glucose, Bld: 104 mg/dL — ABNORMAL HIGH (ref 70–99)
Potassium: 3.3 mmol/L — ABNORMAL LOW (ref 3.5–5.1)
Potassium: 3.2 mmol/L — ABNORMAL LOW (ref 3.5–5.1)
Sodium: 142 mmol/L (ref 135–145)
Sodium: 142 mmol/L (ref 135–145)
Total Bilirubin: 1 mg/dL (ref 0.3–1.2)
Total Bilirubin: 1 mg/dL (ref 0.3–1.2)
Total Protein: 11 g/dL — ABNORMAL HIGH (ref 6.5–8.1)
Total Protein: 10.8 g/dL — ABNORMAL HIGH (ref 6.5–8.1)

## 2020-08-29 LAB — CBC
HCT: 28 % — ABNORMAL LOW (ref 36.0–46.0)
Hemoglobin: 8.3 g/dL — ABNORMAL LOW (ref 12.0–15.0)
MCH: 30.2 pg (ref 26.0–34.0)
MCHC: 29.6 g/dL — ABNORMAL LOW (ref 30.0–36.0)
MCV: 101.8 fL — ABNORMAL HIGH (ref 80.0–100.0)
Platelets: 114 10*3/uL — ABNORMAL LOW (ref 150–400)
RBC: 2.75 MIL/uL — ABNORMAL LOW (ref 3.87–5.11)
RDW: 16.3 % — ABNORMAL HIGH (ref 11.5–15.5)
WBC: 6 10*3/uL (ref 4.0–10.5)
nRBC: 7.8 % — ABNORMAL HIGH (ref 0.0–0.2)

## 2020-08-29 LAB — BPAM FFP
Blood Product Expiration Date: 202112312359
Blood Product Expiration Date: 202112312359
ISSUE DATE / TIME: 202112271042
ISSUE DATE / TIME: 202112271056
Unit Type and Rh: 5100
Unit Type and Rh: 5100

## 2020-08-29 LAB — GLUCOSE, CAPILLARY
Glucose-Capillary: 97 mg/dL (ref 70–99)
Glucose-Capillary: 119 mg/dL — ABNORMAL HIGH (ref 70–99)

## 2020-08-29 LAB — AMMONIA: Ammonia: 50 umol/L — ABNORMAL HIGH (ref 9–35)

## 2020-08-29 LAB — LACTIC ACID, PLASMA: Lactic Acid, Venous: 4.3 mmol/L (ref 0.5–1.9)

## 2020-08-29 LAB — URIC ACID
Uric Acid, Serum: 3.2 mg/dL (ref 2.5–7.1)
Uric Acid, Serum: 2.8 mg/dL (ref 2.5–7.1)

## 2020-08-29 LAB — PREPARE FRESH FROZEN PLASMA
Unit division: 0
Unit division: 0

## 2020-08-29 LAB — MAGNESIUM
Magnesium: 2.1 mg/dL (ref 1.7–2.4)
Magnesium: 2.2 mg/dL (ref 1.7–2.4)

## 2020-08-29 LAB — COOXEMETRY PANEL
Carboxyhemoglobin: 0.4 % — ABNORMAL LOW (ref 0.5–1.5)
Methemoglobin: 0.9 % (ref 0.0–1.5)
O2 Saturation: 60.1 %
Total hemoglobin: 10 g/dL — ABNORMAL LOW (ref 12.0–16.0)

## 2020-08-29 LAB — LACTATE DEHYDROGENASE: LDH: 300 U/L — ABNORMAL HIGH (ref 98–192)

## 2020-08-29 LAB — PROTIME-INR
INR: 2.1 — ABNORMAL HIGH (ref 0.8–1.2)
Prothrombin Time: 23 seconds — ABNORMAL HIGH (ref 11.4–15.2)

## 2020-08-29 LAB — PHOSPHORUS
Phosphorus: 3.6 mg/dL (ref 2.5–4.6)
Phosphorus: 2.7 mg/dL (ref 2.5–4.6)

## 2020-08-29 MED ORDER — MIDAZOLAM HCL 2 MG/2ML IJ SOLN
INTRAMUSCULAR | Status: AC
  Filled 2020-08-29: qty 4

## 2020-08-29 MED ORDER — FREE WATER: 400.0000 mL | Freq: Three times a day (TID) | Status: DC

## 2020-08-29 MED ORDER — ETOMIDATE 2 MG/ML IV SOLN
INTRAVENOUS | Status: AC
  Filled 2020-08-29: qty 20

## 2020-08-29 MED ORDER — MIDAZOLAM HCL 2 MG/2ML IJ SOLN
1.0000 mg | INTRAMUSCULAR | Status: DC | PRN
  Administered 2020-08-29: 03:00:00 1 mg via INTRAVENOUS
  Filled 2020-08-29: qty 2

## 2020-08-29 MED ORDER — POTASSIUM CHLORIDE 20 MEQ PO PACK
20.0000 meq | PACK | Freq: Once | ORAL | Status: AC
  Administered 2020-08-29: 09:00:00 20 meq
  Filled 2020-08-29: qty 1

## 2020-08-29 MED ORDER — SODIUM CHLORIDE 0.9 % IV SOLN: 2.0000 g | INTRAVENOUS | Status: DC

## 2020-08-29 MED ORDER — ETOMIDATE 2 MG/ML IV SOLN: 20.0000 mg | Freq: Once | INTRAVENOUS | Status: AC

## 2020-08-29 MED ORDER — SODIUM CHLORIDE 0.9 % IV SOLN
2.0000 g | Freq: Once | INTRAVENOUS | Status: AC
  Administered 2020-08-29: 02:00:00 2 g via INTRAVENOUS
  Filled 2020-08-29: qty 2

## 2020-08-29 MED ORDER — MIDAZOLAM HCL 2 MG/2ML IJ SOLN
1.0000 mg | INTRAMUSCULAR | Status: DC | PRN
  Administered 2020-08-29: 12:00:00 1 mg via INTRAVENOUS
  Filled 2020-08-29: qty 2

## 2020-08-29 MED ORDER — FENTANYL CITRATE (PF) 100 MCG/2ML IJ SOLN
25.0000 ug | INTRAMUSCULAR | Status: DC | PRN
  Administered 2020-08-29: 25 ug via INTRAVENOUS
  Filled 2020-08-29: qty 2

## 2020-08-29 MED ORDER — POLYETHYLENE GLYCOL 3350 17 G PO PACK
17.0000 g | PACK | Freq: Every day | ORAL | Status: DC
  Administered 2020-08-29: 09:00:00 17 g
  Filled 2020-08-29: qty 1

## 2020-08-29 MED ORDER — CHLORHEXIDINE GLUCONATE 0.12% ORAL RINSE (MEDLINE KIT)
15.0000 mL | Freq: Two times a day (BID) | OROMUCOSAL | Status: DC
  Administered 2020-08-29: 08:00:00 15 mL via OROMUCOSAL

## 2020-08-29 MED ORDER — FUROSEMIDE 10 MG/ML IJ SOLN
40.0000 mg | Freq: Two times a day (BID) | INTRAMUSCULAR | Status: DC
  Administered 2020-08-29: 09:00:00 40 mg via INTRAVENOUS
  Filled 2020-08-29: qty 4

## 2020-08-29 MED ORDER — FENTANYL CITRATE (PF) 100 MCG/2ML IJ SOLN
100.0000 ug | Freq: Once | INTRAMUSCULAR | Status: AC
  Administered 2020-08-29: 01:00:00 100 ug via INTRAVENOUS

## 2020-08-29 MED ORDER — MIDAZOLAM HCL 2 MG/2ML IJ SOLN
2.0000 mg | Freq: Once | INTRAMUSCULAR | Status: AC
  Administered 2020-08-29: 01:00:00 2 mg via INTRAVENOUS

## 2020-08-29 MED ORDER — FUROSEMIDE 10 MG/ML IJ SOLN: 40.0000 mg | Freq: Once | INTRAMUSCULAR | Status: DC

## 2020-08-29 MED ORDER — POTASSIUM CHLORIDE 10 MEQ/50ML IV SOLN
10.0000 meq | INTRAVENOUS | Status: AC
  Administered 2020-08-29 (×3): 10 meq via INTRAVENOUS
  Filled 2020-08-29 (×3): qty 50

## 2020-08-29 MED ORDER — DOCUSATE SODIUM 50 MG/5ML PO LIQD
100.0000 mg | Freq: Two times a day (BID) | ORAL | Status: DC
  Administered 2020-08-29: 09:00:00 100 mg
  Filled 2020-08-29: qty 10

## 2020-08-29 MED ORDER — FENTANYL CITRATE (PF) 100 MCG/2ML IJ SOLN
INTRAMUSCULAR | Status: AC
  Filled 2020-08-29: qty 2

## 2020-08-29 MED ORDER — ORAL CARE MOUTH RINSE
15.0000 mL | OROMUCOSAL | Status: DC
  Administered 2020-08-29 (×3): 15 mL via OROMUCOSAL

## 2020-08-29 MED ORDER — FENTANYL CITRATE (PF) 100 MCG/2ML IJ SOLN
25.0000 ug | INTRAMUSCULAR | Status: DC | PRN
  Administered 2020-08-29 (×3): 100 ug via INTRAVENOUS
  Filled 2020-08-29 (×4): qty 2

## 2020-08-29 MED ORDER — ROCURONIUM BROMIDE 10 MG/ML (PF) SYRINGE
PREFILLED_SYRINGE | INTRAVENOUS | Status: AC
  Filled 2020-08-29: qty 10

## 2020-08-30 LAB — CULTURE, BLOOD (ROUTINE X 2)
Culture: NO GROWTH
Culture: NO GROWTH

## 2020-08-30 LAB — PATHOLOGIST SMEAR REVIEW

## 2020-08-30 LAB — BETA 2 MICROGLOBULIN, SERUM: Beta-2 Microglobulin: 8 mg/L — ABNORMAL HIGH (ref 0.6–2.4)

## 2020-09-02 NOTE — Progress Notes (Signed)
Called by nursing staff for worsening hypotension despite increasing Norepi to 50 mcg. Maps dropped 30-40s.   Discussed with her significant other and son the gravity of the situation.   Palliative met with [name] and Scottie earlier today . Due to progressive decline. He requested DNR and terminal extubation. Given fentanyl + versed with CCM at bedside to terminally extubated.    LVAD pump deactivated by VAD coordinator. She passed with family bedside at 1212.   Roylee Chaffin NP-C   12:25 PM

## 2020-09-02 NOTE — Progress Notes (Signed)
A scheduled 400 mL free water flush was due to be administered.  After given at 2354, she vomited clear liquid with some blood.  Deep oral and nasotracheal suction was done.  Patient put on NRB and HFNC.  O2 sats ranged from 74-90.  She appeared more lethargic and began to use accessory muscles.  Cards MD made aware and came to bedside and contacted family to discuss intubation.  Decision was made to go forward with intubation.  Drugs administered during intubation included:  Fentanyl, 2mg  Versed ,  20mg  of etomidate.  After medications were given she had a hypotensive event and the MAP read as  low as the 40s.  Levophed titrated up and eventually maxed at 40. CCM and Cards MD at bedside during this event.  Pressure has recovered since event.  Patient is resting comfortably and RN is continuing to monitor.

## 2020-09-02 NOTE — Progress Notes (Signed)
Updates overnight noted.  She developed worsening respiratory status and required intubation related to respiratory failure from shock.  She is not a candidate for any other cancer treatment currently except for dexamethasone.  Anticancer treatment or intervention including a bone marrow biopsy will be deferred for the time being.  If she has a meaningful recovery from these events, we will readdress potential obtaining bone marrow biopsy and anticancer treatment.  This appears to be less likely and her overall prognosis is poor given the underlying malignancy in the setting of multiorgan dysfunction.  We will continue to follow as needed. 

## 2020-09-02 NOTE — Progress Notes (Addendum)
Advanced Heart Failure VAD Team Note  PCP-Cardiologist: No primary care provider on file.   Subjective:    2020/09/27 Intubated in early am with progressive respiratory failure.  CXR this morning with bilateral infiltrates.  She is on cefepime. Tm 100.8.   Today pressor requirement increased. Norep 40 mcg + amio drip 60 mg (NSVT runs). Co-ox 60%, CVP 14.   Ca 9.6 today.  Creatinine stable 2.17.   LVAD INTERROGATION:  HeartMate III LVAD:   Flow 4  liters/min, speed 9000, power 5, PI 7.5 . VAD interrogated personally. Parameters stable.  Objective:    Vital Signs:   Temp:  [97.7 F (36.5 C)-100.8 F (38.2 C)] 99.1 F (37.3 C) (12/28 0744) Pulse Rate:  [72-94] 86 (12/28 0400) Resp:  [20-43] 24 (12/28 0400) BP: (74-135)/(62-97) 109/78 (12/28 0400) SpO2:  [77 %-100 %] 95 % (12/28 0400) Arterial Line BP: (75-140)/(52-90) 138/81 (12/28 0400) FiO2 (%):  [80 %-100 %] 80 % (12/28 0400) Weight:  [69.7 kg] 69.7 kg (12/28 0500) Last BM Date: 08/28/20 Mean arterial Pressure 70-90s  Intake/Output:   Intake/Output Summary (Last 24 hours) at 09/27/20 0814 Last data filed at 09/27/2020 0801 Gross per 24 hour  Intake 4201.98 ml  Output 2675 ml  Net 1526.98 ml     Physical Exam  CVP 14 Physical Exam: GENERAL: Intubated  HEENT: ETT/Cortrak  NECK: Supple, JVP  Difficult to assess.   2+ bilaterally, no bruits.  No lymphadenopathy or thyromegaly appreciated.  RIJ  CARDIAC:  Shallow respirations. Mechanical heart sounds with LVAD hum present.  LUNGS:  Diminished breath sounds.   ABDOMEN:  Soft, round, nontender, positive bowel sounds x4.     LVAD exit site: .  Dressing dry and intact.  No erythema or drainage.  Stabilization device present and accurately applied.  Driveline dressing is being changed daily per sterile technique. EXTREMITIES:  Warm and dry, no cyanosis, clubbing, rash or edema   NEUROLOGIC: Not following commands. Intubated.    Telemetry  NSR with NSVT   Labs    Basic Metabolic Panel: Recent Labs  Lab 08/27/20 0435 08/28/20 0233 08/28/20 0625 08/28/20 2148 09-27-20 0039 2020-09-27 0119 09/27/20 0223 09-27-2020 0455 2020/09/27 0500  NA 144 147*   < > 142 147* 142 145 147* 142  K 3.0* 3.2*   < > 2.9* 3.3* 3.3* 3.5 3.4* 3.2*  CL 109 109  --  105  --  107  --   --  108  CO2 22 23  --  20*  --  21*  --   --  20*  GLUCOSE 123* 150*  --  143*  --  172*  --   --  104*  BUN 28* 45*  --  45*  --  47*  --   --  47*  CREATININE 2.08* 2.51*  --  2.05*  --  2.23*  --   --  2.17*  CALCIUM 11.0* 10.8*  --  9.8  --  9.8  --   --  9.6  MG  --   --   --  1.9  --  2.1  --   --  2.2  PHOS  --   --   --   --   --  3.6  --   --  2.7   < > = values in this interval not displayed.    Liver Function Tests: Recent Labs  Lab 08/27/20 0435 08/28/20 0233 08/28/20 2148 September 27, 2020 0119 09/27/2020 0500  AST 28 29  44* 52* 73*  ALT $Re'20 21 25 'BrY$ 33 48*  ALKPHOS 85 79 76 81 78  BILITOT 1.1 1.1 1.0 1.0 1.0  PROT 10.9* 11.9* 10.8* 11.0* 10.8*  ALBUMIN 2.4* 2.5* 2.6* 2.6* 2.6*   No results for input(s): LIPASE, AMYLASE in the last 168 hours. Recent Labs  Lab 08/28/20 0639 09/11/20 0500  AMMONIA 63* 50*    CBC: Recent Labs  Lab 08/10/2020 1300 08/26/20 0035 08/27/20 0435 08/28/20 0233 08/28/20 0625 09-11-2020 0039 11-Sep-2020 0119 09/11/2020 0223 09-11-20 0455 September 11, 2020 0500  WBC 10.5 9.4 7.0 9.7  --   --  7.2  --   --  6.0  NEUTROABS 6.0  --   --   --   --   --  5.1  --   --   --   HGB 10.7* 9.5* 9.4* 9.2*   < > 10.9* 8.3* 9.2* 9.2* 8.3*  HCT 35.9* 31.2* 29.9* 30.7*   < > 32.0* 26.6* 27.0* 27.0* 28.0*  MCV 103.2* 101.3* 99.7 100.7*  --   --  101.1*  --   --  101.8*  PLT 135* 122* 118* 120*  --   --  119*  --   --  114*   < > = values in this interval not displayed.    INR: Recent Labs  Lab 08/26/20 0035 08/27/20 0435 08/28/20 0233 08/28/20 1414 09/11/2020 0500  INR 3.4* 4.5* 7.4* 2.3* 2.1*    Other results:     Imaging   DG Chest 1 View  Result  Date: Sep 11, 2020 CLINICAL DATA:  Aspiration EXAM: CHEST  1 VIEW COMPARISON:  08/27/2020 FINDINGS: Lung volumes are extremely small and pulmonary insufflation has diminished since prior examination. There is progressive bibasilar atelectasis and/or infiltrate and resultant vascular crowding at the hila. Small right pleural effusion again noted. No pneumothorax. Cardiac size is mildly enlarged. Left ventricular assist device again noted. Left subclavian pacemaker defibrillator is unchanged. Right internal jugular. Central venous catheter tip again noted at the superior cavoatrial junction. Nasoenteric feeding tube extends into the upper abdomen beyond the margin of the examination. Thoracolumbar vertebroplasty has been performed. IMPRESSION: Progressive pulmonary hypoinflation. Progressive bibasilar atelectasis and/or infiltrate. Small associated right pleural effusion. Electronically Signed   By: Fidela Salisbury MD   On: 2020/09/11 00:33   DG Abd 1 View  Result Date: 08/28/2020 CLINICAL DATA:  Orogastric tube placement. EXAM: ABDOMEN - 1 VIEW COMPARISON:  None. FINDINGS: The bowel gas pattern is normal. Distal tip of enteric tube is seen in expected position of distal stomach. Left ventricular assist device is seen projected over left upper quadrant. IMPRESSION: Distal tip of enteric tube seen in expected position of distal stomach. Electronically Signed   By: Marijo Conception M.D.   On: 08/28/2020 16:33   DG CHEST PORT 1 VIEW  Result Date: September 11, 2020 CLINICAL DATA:  Respiratory failure EXAM: PORTABLE CHEST 1 VIEW COMPARISON:  1:14 a.m. FINDINGS: Endotracheal tube seen 1.5 cm above the carina. Nasogastric tube and nasoenteric tube extend into the upper abdomen beyond the margin of the examination. Right internal jugular central venous catheter tip seen within the superior right atrium. Left subclavian dual lead pacemaker defibrillator is seen with leads overlying the expected right atrium and right  ventricle. Pulmonary insufflation has slightly improved since prior examination though lung volumes remain small. Superimposed perihilar and bibasilar pulmonary infiltrate persists in keeping with edema or infection. No pneumothorax or pleural effusion. Mild to moderate cardiomegaly remains stable. Left ventricular assist device is again  noted. Vertebroplasty of T10 and L1 has been performed. IMPRESSION: Stable support lines and tubes. Endotracheal tube seen 1.5 cm above the carina. Slight interval improvement in pulmonary insufflation. Stable bilateral pulmonary infiltrates, in keeping with changes of a slightly asymmetric pulmonary edema or infection. Electronically Signed   By: Helyn Numbers MD   On: 09/22/20 06:10   DG CHEST PORT 1 VIEW  Result Date: 2020/09/22 CLINICAL DATA:  Intubation EXAM: PORTABLE CHEST 1 VIEW COMPARISON:  September 22, 2020 FINDINGS: Support Apparatus: --Endotracheal tube: Tip 2 cm above the inferior margin of the carina. --Enteric tube:Tip below the diaphragm --Catheter(s):Right internal jugular vein approach central venous catheter tip is in the right atrium --Other: Unchanged appearance of left ventricular assist device. Left chest wall AICD. Bibasilar atelectasis. IMPRESSION: 1. Endotracheal tube tip 2 cm above the inferior margin of the carina. Electronically Signed   By: Deatra Robinson M.D.   On: 09/22/2020 01:24   DG CHEST PORT 1 VIEW  Result Date: 08/27/2020 CLINICAL DATA:  Central line placement. EXAM: PORTABLE CHEST 1 VIEW COMPARISON:  08/26/2020 FINDINGS: Median sternotomy wires are present. Left-sided pacemaker and LVAD unchanged. Interval placement of right IJ central venous catheter which has tip in the region of the cavoatrial junction. Lungs are hypoinflated demonstrate bilateral patchy airspace opacification slightly worse and may be due to interstitial edema versus multifocal infection. No evidence of pneumothorax. Possible small amount of pleural fluid. Remainder the  exam is unchanged. IMPRESSION: 1. Slight interval worsening bilateral patchy airspace process which may be due to interstitial edema versus multifocal infection. Possible small amount of pleural fluid. 2. Right IJ central venous catheter with tip at the cavoatrial junction. No pneumothorax. Electronically Signed   By: Elberta Fortis M.D.   On: 08/27/2020 18:55   DG CHEST PORT 1 VIEW  Result Date: 08/27/2020 CLINICAL DATA:  Shortness of breath. Status post left ventricular assist device. EXAM: PORTABLE CHEST 1 VIEW COMPARISON:  1 day prior FINDINGS: Dual lead pacer/AICD device. Prior median sternotomy. Vertebral augmentation within the lower thoracic spine. Left ventricular assist device. Numerous leads and wires project over the chest. Midline trachea. Moderate cardiomegaly. Atherosclerosis in the transverse aorta. Layering bilateral pleural effusions, increased. No pneumothorax. Progressive interstitial and airspace disease, lower lung predominant. IMPRESSION: Worsened aeration, with progressive congestive heart failure and layering bilateral pleural effusions. Bibasilar airspace opacities are increased, most likely atelectasis. Electronically Signed   By: Jeronimo Greaves M.D.   On: 08/27/2020 13:47   ECHOCARDIOGRAM COMPLETE  Result Date: 08/28/2020    ECHOCARDIOGRAM REPORT   Patient Name:   Brianna Hanson Date of Exam: 08/28/2020 Medical Rec #:  [id number]      Height:       60.0 in Accession #:    [id number]     Weight:       154.3 lb Date of Birth:  12-02-1934       BSA:          1.672 m Patient Age:    85 years       BP:           0/0 mmHg Patient Gender: F              HR:           77 bpm. Exam Location:  Inpatient Procedure: 2D Echo, Color Doppler and Cardiac Doppler Indications:    I50.21 Acute systolic (congestive) heart failure  History:        Patient has prior history of Echocardiogram examinations,  most                 recent 12/29/2019. CHF, Defibrillator, Pulmonary HTN,                  Arrythmias:Atrial Fibrillation; Risk Factors:Hypertension and                 Dyslipidemia. Heart Mate II placed 04/06/13.  Sonographer:    Raquel Sarna Senior RDCS Referring Phys: [id number] DANIEL R [name] IMPRESSIONS  1. Left ventricular ejection fraction, by estimation, is <20%. The left ventricle has severely decreased function. The left ventricle demonstrates global hypokinesis. Left ventricular diastolic parameters are indeterminate.  2. Right ventricular systolic function is severely reduced. The right ventricular size is normal. There is severely elevated pulmonary artery systolic pressure.  3. Left atrial size was mildly dilated.  4. Right atrial size was severely dilated.  5. The mitral valve is grossly normal. No evidence of mitral valve regurgitation.  6. Tricuspid valve regurgitation is severe.  7. The aortic valve is calcified. There is severe calcifcation of the aortic valve. There is severe thickening of the aortic valve. Aortic valve regurgitation is mild.  8. Aortic dilatation noted. There is borderline dilatation of the ascending aorta, measuring 37 mm. Comparison(s): A prior study was performed on 09/29/2016. Decrease in RV function and increase in tricuspid regurgitation from last study. FINDINGS  Left Ventricle: Left ventricular ejection fraction, by estimation, is <20%. The left ventricle has severely decreased function. The left ventricle demonstrates global hypokinesis. The left ventricular internal cavity size was small. There is no left ventricular hypertrophy. Left ventricular diastolic parameters are indeterminate. Right Ventricle: The right ventricular size is normal. No increase in right ventricular wall thickness. Right ventricular systolic function is severely reduced. There is severely elevated pulmonary artery systolic pressure. The tricuspid regurgitant velocity is 3.49 m/s, and with an assumed right atrial pressure of 15 mmHg, the estimated right ventricular systolic pressure is 16.5 mmHg.  Left Atrium: Left atrial size was mildly dilated. Right Atrium: Right atrial size was severely dilated. Pericardium: There is no evidence of pericardial effusion. Mitral Valve: The mitral valve is grossly normal. No evidence of mitral valve regurgitation. Tricuspid Valve: The tricuspid valve is grossly normal. Tricuspid valve regurgitation is severe. Aortic Valve: The aortic valve is calcified. There is severe calcifcation of the aortic valve. There is severe thickening of the aortic valve. Aortic valve regurgitation is mild. Pulmonic Valve: The pulmonic valve was grossly normal. Pulmonic valve regurgitation is not visualized. Aorta: At present speed, there is no evidence of aortic valve opening. Aortic dilatation noted. There is borderline dilatation of the ascending aorta, measuring 37 mm. IAS/Shunts: The atrial septum is grossly normal. Additional Comments: A pacer wire is visualized.  LEFT VENTRICLE PLAX 2D LVIDd:         3.60 cm  Diastology LVIDs:         3.30 cm  LV e' medial:    4.46 cm/s LV PW:         0.80 cm  LV E/e' medial:  12.6 LV IVS:        0.70 cm  LV e' lateral:   6.20 cm/s LVOT diam:     2.00 cm  LV E/e' lateral: 9.0 LVOT Area:     3.14 cm  RIGHT VENTRICLE RV S prime:     4.57 cm/s TAPSE (M-mode): 0.7 cm LEFT ATRIUM           Index  RIGHT ATRIUM           Index LA diam:      3.30 cm 1.97 cm/m  RA Area:     26.30 cm LA Vol (A2C): 57.7 ml 34.51 ml/m RA Volume:   103.00 ml 61.61 ml/m LA Vol (A4C): 43.6 ml 26.08 ml/m                        PULMONIC VALVE AORTA                 RVOT Peak grad: 1 mmHg Ao Root diam: 2.90 cm Ao Asc diam:  3.70 cm MITRAL VALVE               TRICUSPID VALVE MV Area (PHT): 3.93 cm    TR Peak grad:   48.7 mmHg MV Decel Time: 193 msec    TR Vmax:        349.00 cm/s MV E velocity: 56.10 cm/s MV A velocity: 58.30 cm/s  SHUNTS MV E/A ratio:  0.96        Systemic Diam: 2.00 cm                            Pulmonic VTI:  0.078 m Rudean Haskell MD Electronically signed  by Rudean Haskell MD Signature Date/Time: 08/28/2020/6:32:26 PM    Final      Medications:     Scheduled Medications: . sodium chloride   Intravenous Once  . chlorhexidine gluconate (MEDLINE KIT)  15 mL Mouth Rinse BID  . Chlorhexidine Gluconate Cloth  6 each Topical Daily  . dexamethasone (DECADRON) injection  10 mg Intravenous Q24H  . docusate  100 mg Per Tube BID  . free water  400 mL Per Tube Q4H  . lactulose  20 g Per Tube TID  . mouth rinse  15 mL Mouth Rinse 10 times per day  . pantoprazole sodium  40 mg Per Tube Daily  . polyethylene glycol  17 g Per Tube Daily  . sodium chloride flush  10-40 mL Intracatheter Q12H    Infusions: . sodium chloride Stopped (08/28/20 1235)  . amiodarone 30 mg/hr (2020/09/07 0800)  . [START ON 08/30/2020] ceFEPime (MAXIPIME) IV    . norepinephrine (LEVOPHED) Adult infusion 29 mcg/min (2020-09-07 0800)  . potassium chloride 10 mEq (09-07-20 0801)    PRN Medications: sodium chloride, acetaminophen, fentaNYL (SUBLIMAZE) injection, fentaNYL (SUBLIMAZE) injection, midazolam, midazolam, ondansetron (ZOFRAN) IV, sodium chloride flush   Assessment/Plan:    1. Hypercalcemia, hyperproteinemia and lytic skull lesions in setting of known MGUS -> multiple myeloma - Skeletal survey 12/25: Widespread myeloma lytic lesions and generalized osteopenia. - almost certainly has conversion to multiple myeloma. I d/w Oncology yesterday and they are planning BMBx once hypercalcemia improved. I do not think she is stable enough for this currently.  - Calcium down to 9.6  -  Received zoledronic acid $RemoveBeforeD'4mg'[id number]$  on 12/24 - Decadron per Oncology - SPEP/UPEP, serum free light chains and beta-2 microglobuilin pending  2. AKI - Creatinine 2.17 today. 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  - Nephrology following.   3. Acute hypoxic respiratory  failure - CXR c/w pulmonary edema.  - CVP 14. Give 40 mg IV lasix twice a day.   4. Acute on Chronic systolic VP:XTGG, s/p ICD and LVAD  for DT(04/2013).  - now > 7 years out on VAD support - CVP 14. Give 40 mg IV lasix now.  - minimal pulsativity on A-line.  - ECHO completed with severely reduced RV.   5.  LVAD placed for DT 04/2013:  -VAD interrogated personally. Parameters stable. - No ASA with GI bleed and intolerance.  - INR 3.1-> 3.4 -> 4.5 -> 7.4 received 2UFFPs ->2.1 today   6. PAF:  - S/p DC-CV 06/18/19 -Back in AF 12/27 Started on amio drip. - IV amio started  7.  HTN: Now in shock.   8. Metabolic encephalopathy - likely combination of low output, hypercalcemia - Ammonia 51. On lactulose.   9. NSVT  Continue amio drip.   10.  Fever  - Blood culture obtained.  - Check lactic acid  - Empiric cefepime, ?aspiration PNA.   11. Goals of care - she has multi-system organ failure. -  Palliative Care consult.   Suspect she will not survive this admit given declining condition.  Length of Stay: 4  Amy Clegg, NP 09-23-2020, 8:14 AM  VAD Team --- VAD ISSUES ONLY--- Pager [phone] (7am - 7am)  Advanced Heart Failure Team  Pager [phone] (M-F; 7a - 4p)  Please contact [facility] for night-coverage after hours (4p -7a ) and weekends on [email or web address]  Patient seen with NP, agree with the above note.   Progressive respiratory failure, intubated overnight.  CXR with bilateral pulmonary infiltrates.  On empiric cefepime.  CVP 14, co-ox 60%.  LVAD parameters stable.  Creatinine 2.15.  She is requiring NE at 40 currently.   General: Intubated/sedated.  HEENT: Normal. Neck: Supple, JVP 12 cm. Carotids OK.  Cardiac:  Mechanical heart sounds with LVAD hum present.  Lungs:  Decreased at bases.  Abdomen:  NT, ND, no HSM. No bruits or masses. +BS  LVAD exit site: Well-healed and incorporated. Dressing dry and intact. No erythema or drainage. Stabilization device  present and accurately applied. Driveline dressing changed daily per sterile technique. Extremities:  Warm and dry. No cyanosis, clubbing, rash, or edema.  Neuro:  Sedated on vent.   Multisystem organ failure, now intubated.  Suspect pulmonary edema and possibly PNA.  Suspect mixed distributive (septic) and cardiogenic shock with significant RV failure. Creatinine elevated but stable.  - Continue cefepime empirically for PNA/sepsis.  - Lasix 40 mg IV bid and follow response.  - Requiring NE at 40.   Hypercalcemia resolved with treatment.  Discussed with renal, no further steps for now.  Per oncology, continue dexamethasone and no other treatment at this time for multiple myeloma.   On amiodarone gtt with frequent NSVT and AF.  Now in NSR.   Poor prognosis at this point.  Involving palliative care.  Family at bedside, thinking about code status. Discussed ICD, will make sure that shock function is off.   CRITICAL CARE Performed by: Marca Ancona  Total critical care time: 40 minutes  Critical care time was exclusive of separately billable procedures and treating other patients.  Critical care was necessary to treat or prevent imminent or life-threatening deterioration.  Critical care was time spent personally by me on the following activities: development of treatment plan with patient and/or surrogate as well as nursing, discussions with consultants, evaluation of patient's response to treatment, examination of patient, obtaining history from patient or surrogate, ordering and performing treatments and interventions, ordering and review of laboratory studies, ordering and review of radiographic studies, pulse oximetry and re-evaluation of patient's condition.  Lillian Tigges [employer]  2020/09/15 8:57 AM

## 2020-09-02 NOTE — Consult Note (Signed)
Consultation Note Date: 09/09/2020   Patient Name: Brianna Hanson  DOB: 10-11-1934  MRN: [id number]  Age / Sex: 86 y.o., female  PCP: Brianna Artist, MD Referring Physician: Jolaine Artist, MD  Reason for Consultation: Establishing goals of care  HPI/Patient Profile: 86 y.o. female  with past medical history of severe systolic CHF due to NICM s/p HM 3 LVAD 2014, atrial fibrillation, s/p AICD, plasma cell disorder/MGUS follows with Brianna. Alen Hanson admitted on 08/27/2020 with weakness and altered mental status and found to have hypercalcemia and hyperproteinemia with lytic skull lesions with thoughts of progression to multiple myeloma. Progressive and significant decline over course of hospitalization requiring intubation. Now in multi-organ failure and hypotension unresponsive to vasopressor support.   Clinical Assessment and Goals of Care: Significant other, Brianna Hanson, and son, Brianna Hanson, at bedside. They are tearful and understand that prognosis is poor. However, they want to continue to be hopeful that she will have some improvement. I did explain that I am concerned that she is not responding to the aggressive measures we are providing and that she will likely continue to get worse. I want them to be prepared that we are supporting her heart with LVAD, lungs with vent (although still tachypneic), and BP with vasopressors (although still hypotensive), and her kidneys are failing as well as body overwhelmed with burden of her cancer. They wish to continue current interventions. They do agree for DNR at this time. They are not yet ready for comfort measures although this was discussed and educated that this can begin at any time if she appears to be in pain or suffering or with continued decline. Chaplain called for additional support. I believe they just need a little more time but Ms. Brianna Hanson may make this decision for them  as she is declining rapidly. Will follow up in a few hours to give them time to process.   Addendum: Prior to follow up [name] had further decline and has made comfortable prior to her death. Heart failure team assisted with this process.   Primary Decision Maker NEXT OF KIN significant other, Brianna Hanson, and son, Brianna Hanson    SUMMARY OF RECOMMENDATIONS   - DNR decided - Family not ready for full comfort at time of my visit  Code Status/Advance Care Planning:  DNR   Symptom Management:   Per attending. Recommended consideration of comfort medications to family.   Palliative Prophylaxis:   Frequent Pain Assessment, Oral Care and Turn Reposition  Psycho-social/Spiritual:   Desire for further Chaplaincy support:yes  Additional Recommendations: Grief/Bereavement Support  Prognosis:   Hours - Days  Discharge Planning: Anticipated Hospital Death      Primary Diagnoses: Present on Admission: . Chronic systolic congestive heart failure ([place])   I have reviewed the medical record, interviewed the patient and family, and examined the patient. The following aspects are pertinent.  Past Medical History:  Diagnosis Date  . AICD (automatic cardioverter/defibrillator) present   . Anemia   . Atrial fibrillation or flutter   . Cardiac arrest -  ventricular fibrillation 12/10   with successful resucitation, S/p ICD  . CHF (congestive heart failure) ([place])   . Diverticula of colon 2011  . HTN (hypertension)    moderate  . ICD (implantable cardiac defibrillator) in place    she has received appropriate therapy for VF  . Internal and external hemorrhoids without complication 1751  . Nonischemic cardiomyopathy ([place])    followed by [facility]anna [facility] at [facility]  . Osteopenia   . Plasma cell disorder 03/20/2012  . Plasma cell disorder 03/20/2012  . Sessile colonic polyp 2011   Brianna Brianna Hanson   Social History   Socioeconomic History  . Marital status: Soil scientist    Spouse name: Not on  file  . Number of children: 3  . Years of education: Not on file  . Highest education level: Not on file  Occupational History  . Not on file  Tobacco Use  . Smoking status: Never Smoker  . Smokeless tobacco: Never Used  . Tobacco comment: remote history of tobacco abuse  Vaping Use  . Vaping Use: Never used  Substance and Sexual Activity  . Alcohol use: Yes    Comment: occsionally wine  . Drug use: No  . Sexual activity: Not Currently    Birth control/protection: Surgical  Other Topics Concern  . Not on file  Social History Narrative   Lives with spouse who was recently diagnosed with esophageal ca and is receiving chemotherapy   Social Determinants of Health   Financial Resource Strain: Not on file  Food Insecurity: Not on file  Transportation Needs: Not on file  Physical Activity: Not on file  Stress: Not on file  Social Connections: Not on file   Family History  Problem Relation Age of Onset  . Stroke Mother   . Stroke Brother    Scheduled Meds: . sodium chloride   Intravenous Once  . chlorhexidine gluconate (MEDLINE KIT)  15 mL Mouth Rinse BID  . Chlorhexidine Gluconate Cloth  6 each Topical Daily  . dexamethasone (DECADRON) injection  10 mg Intravenous Q24H  . docusate  100 mg Per Tube BID  . free water  400 mL Per Tube Q8H  . furosemide  40 mg Intravenous BID  . lactulose  20 g Per Tube TID  . mouth rinse  15 mL Mouth Rinse 10 times per day  . pantoprazole sodium  40 mg Per Tube Daily  . polyethylene glycol  17 g Per Tube Daily  . sodium chloride flush  10-40 mL Intracatheter Q12H   Continuous Infusions: . sodium chloride Stopped (08/28/20 1235)  . amiodarone 60 mg/hr (Sep 18, 2020 0815)  . [START ON 08/30/2020] ceFEPime (MAXIPIME) IV    . norepinephrine (LEVOPHED) Adult infusion 29 mcg/min (2020/09/18 0800)   PRN Meds:.sodium chloride, acetaminophen, fentaNYL (SUBLIMAZE) injection, fentaNYL (SUBLIMAZE) injection, midazolam, midazolam, ondansetron (ZOFRAN)  IV, sodium chloride flush Allergies  Allergen Reactions  . Oxycodone Other (See Comments)    Hallucinations    Review of Systems  Unable to perform ROS: Acuity of condition    Physical Exam Vitals and nursing note reviewed.  Constitutional:      Appearance: She is ill-appearing.     Interventions: She is intubated.  Cardiovascular:     Rate and Rhythm: Tachycardia present.  Pulmonary:     Effort: Tachypnea and accessory muscle usage present. She is intubated.  Abdominal:     General: Abdomen is flat.  Neurological:     Mental Status: She is unresponsive.     Vital  Signs: BP 109/78 (BP Location: Left Arm)   Pulse 100   Temp 99.1 F (37.3 C) (Oral)   Resp (!) 38   Ht 5' (1.524 m)   Wt 69.7 kg   SpO2 92%   BMI 30.01 kg/m  Pain Scale: CPOT POSS *See Group Information*: 4-INTERVENTION REQUIRED,Unacceptable,Somnolent, mininal or no response to verbal and physical stimulation Pain Score: 0-No pain   SpO2: SpO2: 92 % O2 Device:SpO2: 92 % O2 Flow Rate: .O2 Flow Rate (L/min): 5 L/min  IO: Intake/output summary:   Intake/Output Summary (Last 24 hours) at 09-08-20 1013 Last data filed at 2020-09-08 1000 Gross per 24 hour  Intake 3683.45 ml  Output 2700 ml  Net 983.45 ml    LBM: Last BM Date: 08/28/20 Baseline Weight: Weight: 74.8 kg Most recent weight: Weight: 69.7 kg     Palliative Assessment/Data:     Time In/Out: 1015-1105  Time Total: 50 min Greater than 50%  of this time was spent counseling and coordinating care related to the above assessment and plan.  Signed by: Vinie Sill, NP Palliative Medicine Team Pager # [phone] (M-F 8a-5p) Team Phone # [phone] (Nights/Weekends)

## 2020-09-02 NOTE — Progress Notes (Addendum)
[facility] NEPHROLOGY PROGRESS NOTE  Assessment/ Plan: Pt is a 86 y.o. yo female  PMH of severe NICM, chronic systolic CHF on chronic LVAD, PAF, recent diagnosis of IgA kappa multiple myeloma, seen as a consultation at the request of [name] for the evaluation of hypercalcemia and AKI.  #Severe hypercalcemia due to multiple myeloma: X-ray with widespread osteolytic lesions and generalized osteopenia.  Treated with IV fluid, Lasix which is on hold now.  Also received calcitonin and zoledronic acid 4 mg on 12/24.  The calcium level improved to 9.6 today. She is also on Decadron per oncology. Monitor lab, no additional dose for bisphosphonate today.  #Acute kidney injury multifactorial etiology including severe hypercalcemia, cardiogenic shock on Levophed and hemodynamic instability with A. fib with RVR: Patient is nonoliguric.  Urinalysis with minimal protein but no cells.  The creatinine level was fairly stable in the beginning of this month therefore do not think overt myeloma kidney. Per oncology, the malignancy is not curable and given her current situation, she is not a candidate to receive anticancer therapy.  Given her overall poor prognosis due to multiorgan failure, I do not think she is a candidate for dialysis treatment. Creatinine level stable.  #Hypernatremia: She received IV fluid in the beginning for hypercalcemia and then loop diuretics.  Currently on hold.   Started free water with improvement of sodium level.  I will lower free water from the tube.  #Hypokalemia: Repleting potassium chloride.  #Multiple myeloma, IgA kappa: Presented with generalized lytic bone lesions, renal failure, hypercalcemia.  Currently on steroid.  Treatment of hypercalcemia as above.    Given critical illness no plan for biopsy or anticancer treatment therefore, she has poor prognosis per oncology.  #Acute hypoxic respiratory failure: Required mechanical ventilation.  Per  PCCM.  #Acute on chronic systolic CHF: Follows closely with cardiology.  She has LVAD.  A dose of Lasix today.  #Shock: On Levophed.  Patient is critically ill.  Calcium level improved and renal function overall stable.  If she does not get treatment for multiple myeloma then she has very poor prognosis.  Not a candidate for dialysis.  I will sign off, please call back with question. Discussed with Dr. Aundra Dubin.  Subjective: Seen and examined.  Required mechanical ventilation.  Febrile to 100.8.  Urine output around 1.5 L.  Patient is currently sedated and intubated. Objective Vital signs in last 24 hours: Vitals:   09/17/20 0350 2020/09/17 0400 2020-09-17 0500 17-Sep-2020 0744  BP: 135/81 109/78    Pulse: 88 86    Resp: (!) 35 (!) 24    Temp:  (!) 100.8 F (38.2 C)  99.1 F (37.3 C)  TempSrc:  Oral  Oral  SpO2: 98% 95%    Weight:   69.7 kg   Height:       Weight change: -0.9 kg  Intake/Output Summary (Last 24 hours) at 09/17/20 0827 Last data filed at 2020-09-17 0801 Gross per 24 hour  Intake 4201.98 ml  Output 2675 ml  Net 1526.98 ml       Labs: Basic Metabolic Panel: Recent Labs  Lab 08/28/20 2148 Sep 17, 2020 0039 Sep 17, 2020 0119 09-17-2020 0223 09-17-2020 0455 09-17-2020 0500  NA 142   < > 142 145 147* 142  K 2.9*   < > 3.3* 3.5 3.4* 3.2*  CL 105  --  107  --   --  108  CO2 20*  --  21*  --   --  20*  GLUCOSE 143*  --  172*  --   --  104*  BUN 45*  --  47*  --   --  47*  CREATININE 2.05*  --  2.23*  --   --  2.17*  CALCIUM 9.8  --  9.8  --   --  9.6  PHOS  --   --  3.6  --   --  2.7   < > = values in this interval not displayed.   Liver Function Tests: Recent Labs  Lab 08/28/20 2148 09/10/20 0119 09/10/20 0500  AST 44* 52* 73*  ALT 25 33 48*  ALKPHOS 76 81 78  BILITOT 1.0 1.0 1.0  PROT 10.8* 11.0* 10.8*  ALBUMIN 2.6* 2.6* 2.6*   No results for input(s): LIPASE, AMYLASE in the last 168 hours. Recent Labs  Lab 08/28/20 0639 2020-09-10 0500  AMMONIA 63* 50*    CBC: Recent Labs  Lab 08/10/2020 1300 08/26/20 0035 08/27/20 0435 08/28/20 0233 08/28/20 0625 09/10/20 0119 2020/09/10 0223 Sep 10, 2020 0455 Sep 10, 2020 0500  WBC 10.5 9.4 7.0 9.7  --  7.2  --   --  6.0  NEUTROABS 6.0  --   --   --   --  5.1  --   --   --   HGB 10.7* 9.5* 9.4* 9.2*   < > 8.3* 9.2* 9.2* 8.3*  HCT 35.9* 31.2* 29.9* 30.7*   < > 26.6* 27.0* 27.0* 28.0*  MCV 103.2* 101.3* 99.7 100.7*  --  101.1*  --   --  101.8*  PLT 135* 122* 118* 120*  --  119*  --   --  114*   < > = values in this interval not displayed.   Cardiac Enzymes: No results for input(s): CKTOTAL, CKMB, CKMBINDEX, TROPONINI in the last 168 hours. CBG: Recent Labs  Lab 08/10/2020 1223 08/26/20 2120 08/27/20 2333 September 10, 2020 0451  GLUCAP 108* 144* 120* 97    Iron Studies: No results for input(s): IRON, TIBC, TRANSFERRIN, FERRITIN in the last 72 hours. Studies/Results: DG Chest 1 View  Result Date: 09-10-2020 CLINICAL DATA:  Aspiration EXAM: CHEST  1 VIEW COMPARISON:  08/27/2020 FINDINGS: Lung volumes are extremely small and pulmonary insufflation has diminished since prior examination. There is progressive bibasilar atelectasis and/or infiltrate and resultant vascular crowding at the hila. Small right pleural effusion again noted. No pneumothorax. Cardiac size is mildly enlarged. Left ventricular assist device again noted. Left subclavian pacemaker defibrillator is unchanged. Right internal jugular. Central venous catheter tip again noted at the superior cavoatrial junction. Nasoenteric feeding tube extends into the upper abdomen beyond the margin of the examination. Thoracolumbar vertebroplasty has been performed. IMPRESSION: Progressive pulmonary hypoinflation. Progressive bibasilar atelectasis and/or infiltrate. Small associated right pleural effusion. Electronically Signed   By: Fidela Salisbury MD   On: 09-10-2020 00:33   DG Abd 1 View  Result Date: 08/28/2020 CLINICAL DATA:  Orogastric tube placement. EXAM:  ABDOMEN - 1 VIEW COMPARISON:  None. FINDINGS: The bowel gas pattern is normal. Distal tip of enteric tube is seen in expected position of distal stomach. Left ventricular assist device is seen projected over left upper quadrant. IMPRESSION: Distal tip of enteric tube seen in expected position of distal stomach. Electronically Signed   By: Marijo Conception M.D.   On: 08/28/2020 16:33   DG CHEST PORT 1 VIEW  Result Date: 10-Sep-2020 CLINICAL DATA:  Respiratory failure EXAM: PORTABLE CHEST 1 VIEW COMPARISON:  1:14 a.m. FINDINGS: Endotracheal tube seen 1.5 cm above the carina. Nasogastric tube and nasoenteric tube  extend into the upper abdomen beyond the margin of the examination. Right internal jugular central venous catheter tip seen within the superior right atrium. Left subclavian dual lead pacemaker defibrillator is seen with leads overlying the expected right atrium and right ventricle. Pulmonary insufflation has slightly improved since prior examination though lung volumes remain small. Superimposed perihilar and bibasilar pulmonary infiltrate persists in keeping with edema or infection. No pneumothorax or pleural effusion. Mild to moderate cardiomegaly remains stable. Left ventricular assist device is again noted. Vertebroplasty of T10 and L1 has been performed. IMPRESSION: Stable support lines and tubes. Endotracheal tube seen 1.5 cm above the carina. Slight interval improvement in pulmonary insufflation. Stable bilateral pulmonary infiltrates, in keeping with changes of a slightly asymmetric pulmonary edema or infection. Electronically Signed   By: Fidela Salisbury MD   On: 2020/08/30 06:10   DG CHEST PORT 1 VIEW  Result Date: Aug 30, 2020 CLINICAL DATA:  Intubation EXAM: PORTABLE CHEST 1 VIEW COMPARISON:  08-30-2020 FINDINGS: Support Apparatus: --Endotracheal tube: Tip 2 cm above the inferior margin of the carina. --Enteric tube:Tip below the diaphragm --Catheter(s):Right internal jugular vein approach  central venous catheter tip is in the right atrium --Other: Unchanged appearance of left ventricular assist device. Left chest wall AICD. Bibasilar atelectasis. IMPRESSION: 1. Endotracheal tube tip 2 cm above the inferior margin of the carina. Electronically Signed   By: Ulyses Jarred M.D.   On: 08/30/20 01:24   DG CHEST PORT 1 VIEW  Result Date: 08/27/2020 CLINICAL DATA:  Central line placement. EXAM: PORTABLE CHEST 1 VIEW COMPARISON:  08/26/2020 FINDINGS: Median sternotomy wires are present. Left-sided pacemaker and LVAD unchanged. Interval placement of right IJ central venous catheter which has tip in the region of the cavoatrial junction. Lungs are hypoinflated demonstrate bilateral patchy airspace opacification slightly worse and may be due to interstitial edema versus multifocal infection. No evidence of pneumothorax. Possible small amount of pleural fluid. Remainder the exam is unchanged. IMPRESSION: 1. Slight interval worsening bilateral patchy airspace process which may be due to interstitial edema versus multifocal infection. Possible small amount of pleural fluid. 2. Right IJ central venous catheter with tip at the cavoatrial junction. No pneumothorax. Electronically Signed   By: Marin Olp M.D.   On: 08/27/2020 18:55   DG CHEST PORT 1 VIEW  Result Date: 08/27/2020 CLINICAL DATA:  Shortness of breath. Status post left ventricular assist device. EXAM: PORTABLE CHEST 1 VIEW COMPARISON:  1 day prior FINDINGS: Dual lead pacer/AICD device. Prior median sternotomy. Vertebral augmentation within the lower thoracic spine. Left ventricular assist device. Numerous leads and wires project over the chest. Midline trachea. Moderate cardiomegaly. Atherosclerosis in the transverse aorta. Layering bilateral pleural effusions, increased. No pneumothorax. Progressive interstitial and airspace disease, lower lung predominant. IMPRESSION: Worsened aeration, with progressive congestive heart failure and  layering bilateral pleural effusions. Bibasilar airspace opacities are increased, most likely atelectasis. Electronically Signed   By: Abigail Miyamoto M.D.   On: 08/27/2020 13:47   ECHOCARDIOGRAM COMPLETE  Result Date: 08/28/2020    ECHOCARDIOGRAM REPORT   Patient Name:   Brianna Hanson Date of Exam: 08/28/2020 Medical Rec #:  [id number]      Height:       60.0 in Accession #:    [id number]     Weight:       154.3 lb Date of Birth:  03-Jun-1935       BSA:          1.672 m Patient Age:    14 years  BP:           0/0 mmHg Patient Gender: F              HR:           77 bpm. Exam Location:  Inpatient Procedure: 2D Echo, Color Doppler and Cardiac Doppler Indications:    K74.25 Acute systolic (congestive) heart failure  History:        Patient has prior history of Echocardiogram examinations, most                 recent 12/29/2019. CHF, Defibrillator, Pulmonary HTN,                 Arrythmias:Atrial Fibrillation; Risk Factors:Hypertension and                 Dyslipidemia. Heart Mate II placed 04/06/13.  Sonographer:    Raquel Sarna Senior RDCS Referring Phys: [id number] DANIEL R [name] IMPRESSIONS  1. Left ventricular ejection fraction, by estimation, is <20%. The left ventricle has severely decreased function. The left ventricle demonstrates global hypokinesis. Left ventricular diastolic parameters are indeterminate.  2. Right ventricular systolic function is severely reduced. The right ventricular size is normal. There is severely elevated pulmonary artery systolic pressure.  3. Left atrial size was mildly dilated.  4. Right atrial size was severely dilated.  5. The mitral valve is grossly normal. No evidence of mitral valve regurgitation.  6. Tricuspid valve regurgitation is severe.  7. The aortic valve is calcified. There is severe calcifcation of the aortic valve. There is severe thickening of the aortic valve. Aortic valve regurgitation is mild.  8. Aortic dilatation noted. There is borderline dilatation of the ascending  aorta, measuring 37 mm. Comparison(s): A prior study was performed on 09/29/2016. Decrease in RV function and increase in tricuspid regurgitation from last study. FINDINGS  Left Ventricle: Left ventricular ejection fraction, by estimation, is <20%. The left ventricle has severely decreased function. The left ventricle demonstrates global hypokinesis. The left ventricular internal cavity size was small. There is no left ventricular hypertrophy. Left ventricular diastolic parameters are indeterminate. Right Ventricle: The right ventricular size is normal. No increase in right ventricular wall thickness. Right ventricular systolic function is severely reduced. There is severely elevated pulmonary artery systolic pressure. The tricuspid regurgitant velocity is 3.49 m/s, and with an assumed right atrial pressure of 15 mmHg, the estimated right ventricular systolic pressure is 95.6 mmHg. Left Atrium: Left atrial size was mildly dilated. Right Atrium: Right atrial size was severely dilated. Pericardium: There is no evidence of pericardial effusion. Mitral Valve: The mitral valve is grossly normal. No evidence of mitral valve regurgitation. Tricuspid Valve: The tricuspid valve is grossly normal. Tricuspid valve regurgitation is severe. Aortic Valve: The aortic valve is calcified. There is severe calcifcation of the aortic valve. There is severe thickening of the aortic valve. Aortic valve regurgitation is mild. Pulmonic Valve: The pulmonic valve was grossly normal. Pulmonic valve regurgitation is not visualized. Aorta: At present speed, there is no evidence of aortic valve opening. Aortic dilatation noted. There is borderline dilatation of the ascending aorta, measuring 37 mm. IAS/Shunts: The atrial septum is grossly normal. Additional Comments: A pacer wire is visualized.  LEFT VENTRICLE PLAX 2D LVIDd:         3.60 cm  Diastology LVIDs:         3.30 cm  LV e' medial:    4.46 cm/s LV PW:         0.80 cm  LV E/e' medial:   12.6 LV IVS:        0.70 cm  LV e' lateral:   6.20 cm/s LVOT diam:     2.00 cm  LV E/e' lateral: 9.0 LVOT Area:     3.14 cm  RIGHT VENTRICLE RV S prime:     4.57 cm/s TAPSE (M-mode): 0.7 cm LEFT ATRIUM           Index       RIGHT ATRIUM           Index LA diam:      3.30 cm 1.97 cm/m  RA Area:     26.30 cm LA Vol (A2C): 57.7 ml 34.51 ml/m RA Volume:   103.00 ml 61.61 ml/m LA Vol (A4C): 43.6 ml 26.08 ml/m                        PULMONIC VALVE AORTA                 RVOT Peak grad: 1 mmHg Ao Root diam: 2.90 cm Ao Asc diam:  3.70 cm MITRAL VALVE               TRICUSPID VALVE MV Area (PHT): 3.93 cm    TR Peak grad:   48.7 mmHg MV Decel Time: 193 msec    TR Vmax:        349.00 cm/s MV E velocity: 56.10 cm/s MV A velocity: 58.30 cm/s  SHUNTS MV E/A ratio:  0.96        Systemic Diam: 2.00 cm                            Pulmonic VTI:  0.078 m Rudean Haskell MD Electronically signed by Rudean Haskell MD Signature Date/Time: 08/28/2020/6:32:26 PM    Final     Medications: Infusions: . sodium chloride Stopped (08/28/20 1235)  . amiodarone 30 mg/hr (03-Sep-2020 0800)  . [START ON 08/30/2020] ceFEPime (MAXIPIME) IV    . norepinephrine (LEVOPHED) Adult infusion 29 mcg/min (09/03/2020 0800)  . potassium chloride 10 mEq (09-03-2020 0801)    Scheduled Medications: . sodium chloride   Intravenous Once  . chlorhexidine gluconate (MEDLINE KIT)  15 mL Mouth Rinse BID  . Chlorhexidine Gluconate Cloth  6 each Topical Daily  . dexamethasone (DECADRON) injection  10 mg Intravenous Q24H  . docusate  100 mg Per Tube BID  . free water  400 mL Per Tube Q4H  . furosemide  40 mg Intravenous Once  . lactulose  20 g Per Tube TID  . mouth rinse  15 mL Mouth Rinse 10 times per day  . pantoprazole sodium  40 mg Per Tube Daily  . polyethylene glycol  17 g Per Tube Daily  . potassium chloride  20 mEq Per Tube Once  . sodium chloride flush  10-40 mL Intracatheter Q12H    have reviewed scheduled and prn  medications.  Physical Exam: General: Intubated and sedated. Heart:RRR, s1s2 nl Lungs: Coarse breath sound bilateral, no wheezing Abdomen:soft,  non-distended Extremities:No edema Neurology: Verlan Friends September 03, 2020,8:27 AM  LOS: 4 days  Pager: [phone]

## 2020-09-02 NOTE — Discharge Summary (Signed)
Advanced Heart Failure Death Summary  Death Summary   Patient ID: MERANDA DECHAINE MRN: [id number], DOB/AGE: May 09, 1935 86 y.o. Admit date: 08/22/2020 D/C date:     September 08, 2020   Time of Death Nov 19, 1210   Primary Discharge Diagnoses:  1. Hypercalcemia, hyperproteinemia and lytic skull lesions in setting of known MGUS -> multiple myeloma - Skeletal survey 12/25: Widespread myeloma lytic lesions and generalized osteopenia. 2. AKI 3. Acute hypoxic respiratory failure 4. Acute on Chronic systolic NL:ZJQB, s/p ICD and LVAD for DT(04/2013).  5. LVAD placed for DT 04/2013:  6. PAF  7. HTN 8. Metabolic encephalopathy 9. NSVT  10.  Fever  11. Goals of care 12 Compression Fractures   Hospital Course:  Ms Arriola was an 86 year old with a history NICM,  HM II LVAD, MGUS, PAF, and HTN.    Presented to [facility] with AMS and weakness. Hypercalcemic on admit with CA >15. CT of head demonstrated multiple lytic lesions suggestive of multiple myeloma. Hypercalcemia treated with IV fluids, calcitonin and zoledronic acid. IV fluid stopped due to volume overload so IV lasix was given.    Oncology consulted for multiple myeloma. Bone marrow biopsy initially recommended but due to deterioration and poor prognosis this was not pursued. Placed on prednisone with no improvement. She was not a candidate for chemo. Nephrology consulted for hypercalcemia   Nephrology consulted for AKI and hypercalcemia. Dialysis was not indicated.   On 09-08-20 she developed acute hypoxemic respiratory failure and required urgent intubation. CCM consulted. Developed hypotension and was placed on norepi with escalation to maximum dose. Unfortunately she continued to decline with hypotension and multisystem organ failure. Her family elected DNR/terminal extubation and VAD deactivation.    She passed with family at the bedside.    Significant Diagnostic Studies Skeletal survey 12/25: Widespread myeloma lytic lesions and generalized  osteopenia. - almost certainly has conversion to multiple myeloma. Compression fractures of T10, T12, and L1-L5. L2 and L5. compression fractures which were new.   Consultations  Oncology Nephrology CCM  Palliative Care   Duration of Discharge Encounter: Greater than 35 minutes   Signed, Darrick Grinder, NP 09-08-2020, 4:58 PM

## 2020-09-02 NOTE — Progress Notes (Signed)
eLink Physician-Brief Progress Note Patient Name: Brianna Hanson DOB: [birth year] MRN: [id number]   Date of Service  08/02/2020  HPI/Events of Note  ABG on 100%/PRVC 22 (patient breathing at 33)/TV 360/P 5 = 7.265/52.2/122  eICU Interventions  Plan: 1. Increase PRVC rate to 35. 2. Repeat ABG at 7:15 AM.     Intervention Category Major Interventions: Acid-Base disturbance - evaluation and management;Respiratory failure - evaluation and management  Dushawn Pusey [place] 08/07/2020, 3:17 AM

## 2020-09-02 NOTE — Procedures (Signed)
Intubation Procedure Note  Brianna Hanson  [id number]  July 27, 1935  Date:08-31-2020  Time:1:09 AM   Provider Performing:Jhordan Kinter W Mikey Bussing    Procedure: Intubation (31500)  Indication(s) Respiratory Failure  Consent Risks of the procedure as well as the alternatives and risks of each were explained to the patient and/or caregiver.  Consent for the procedure was obtained and is signed in the bedside chart   Anesthesia Etomidate, Versed and Fentanyl   Time Out Verified patient identification, verified procedure, site/side was marked, verified correct patient position, special equipment/implants available, medications/allergies/relevant history reviewed, required imaging and test results available.   Sterile Technique Usual hand hygeine, masks, and gloves were used   Procedure Description Patient positioned in bed supine.  Sedation given as noted above.  Patient was intubated with endotracheal tube using Glidescope.  View was Grade 2 only posterior commissure .  Number of attempts was 1.  Airway noted to be bloody. Colorimetric CO2 detector was consistent with tracheal placement.   Complications/Tolerance Hypotension, treated with increasing pressor dose.  Chest X-ray is ordered to verify placement.   EBL Minimal   Specimen(s) None   Joneen Roach, AGACNP-BC [facility]  See Amion for personal pager PCCM on call pager [phone]  Aug 31, 2020 1:10 AM

## 2020-09-02 NOTE — Progress Notes (Signed)
Palliative:  Full note to follow. Significant other, [name], and son, Lorin Picket, at bedside. They are tearful and understand that prognosis is poor. However, they want to continue to be hopeful that she will have some improvement. I did explain that I am concerned that she is not responding to the aggressive measures we are providing and that she may continue to get worse. I wanted them to be prepared that we are supporting her heart with LVAD, lungs with vent (although still tachypneic), and BP with vasopressors (although still hypotensive), and her kidneys are failing as well as body overwhelmed with burden of her cancer. They wish to continue current interventions. They do agree for DNR at this time. They are not yet ready for comfort measures although this was discussed and educated that this can begin at any time if she appears to be in pain or suffering. Will call chaplain for additional support.   Yong Channel, NP Palliative Medicine Team Pager [phone] (Please see [email or web address] for schedule) Team Phone [phone]

## 2020-09-02 NOTE — Progress Notes (Addendum)
LVAD Coordinator Rounding Note:  Admitted 08/19/2020 per Dr. Gala Romney due to weakness and altered mental status.   HM II LVAD implanted on 04/06/13 by Dr. Laneta Simmers under Destination Therapy criteria due to advanced age.   Pt intubated overnight. Remains orally intubated with bloody dressings noted in nostrils. Eyes closed, does not open to verbal or physical stimuli. Respirations shallow and rapid, > 40/min. Skin warm and dry, extremities cool.   CM showed 2 - 3 screen run NSVT with BP dropping; Dr. Shirlee Latch updated. BS nurse called [name] (SO) and asked him to come to hospital asap. Lorin Picket (son) arrived to room, updated, became tearful. BS nurse ordered comfort cart.  [name] believes patient's ICD therapy has been turned off; last CV note shows it is still on. He agrees that he does not want Verdell to receive any shocks. Contacted [employer] clinical rep Donnald Garre) and asked her to interrogate and turn ICD therapy off. [name] in agreement with same.   Vital signs: Temp:  99.1 HR: 104 A-line BP: 105/70 (82) O2 Sat: 94% on 80% FIO2 and 5 peep Wt: 152>155>154>153.6 lbs   LVAD interrogation reveals:  Speed:  9000 Flow:  3.9 Power:  4.6w PI: 7.4 Alarms: none Events:  rare  Fixed speed: 9000 Low speed limit: 8400  Drive Line: dressing C/D/I with anchor intact and accurately applied. Weekly dressing changes per [name] (SO). Next dressing change due 09/05/19.  Labs:  LDH trend: 246>238>259>300  INR trend: 3.4>4.5>7.4>2.3>2.1  Calcium:  15.0>11.2>11.0>10.8>9.8  Creat: 1.87>2.0>2.5>2.23  Anticoagulation Plan: -INR Goal: 2.0 - 2.5  - on hold  -ASA Dose: none  Blood Products:  - 08/28/20>>2 units FFP  Device: -  [employer] dual ICD - Therapies:  -  VT detection off Sep 02, 2020 -  VF therapy off Sep 02, 2020 -  Pacing:  DDD 60  Arrythmias:  -  08/28/20>AF with RVR - IV amiodarone  Gtts: - Levophed >>30 mcg/min -  Amiodarone>>30 mg/hr   Plan/Recommendations:  1. Call VAD Coordinator with any VAD  equipment or drive line issues.  [name] RN, VAD Coordinator 24/7 VAD pager: [phone]

## 2020-09-02 NOTE — Consult Note (Signed)
NAME:  Brianna Hanson, MRN:  [id number], DOB:  09/25/1934, LOS: 4 ADMISSION DATE:  08/03/2020, CONSULTATION DATE:  12/28/2 REFERRING MD:  Albertha Ghee, MD CHIEF COMPLAINT:  Acute hypoxemic respiratory failure  Brief History:  86 year old female with LVAD who presents with heart failure, renal failure and hypercalcemia and new lytic bone lesions concerning for multiple myeloma. PCCM consulted for acute hypoxemic respiratory failure requiring intubation and vent management.   History of Present Illness:  Ms. Brianna Hanson is a 86 year old female with NICM s/p ICD and LVAD and hx of MGUS who presented with AMS, weakness and hypercalcemia. CT head demonstrated multiple lytic lesions concerning for multiple myeloma in the setting of Ca >15 and known MGUS. Admitted on heart failure service and during her hospital course she developed respiratory distress secondary to pulmonary edema, heart failure, renal failure and AFRVR. Primary team has discussed with family regarding code status and patient currently DNR with wish to intubate if indicated per overnight Cardiology team. PCCM consulted overnight on 12/28 for respiratory distress requiring intubation.  Past Medical History:  IgA MGUS, chronic systolic heart failure, NICM s/p ICD and LVAD, paroxysmal atrial fibrillation, HTN,  Significant Hospital Events:  12/28 - PCCM consulted for intubation and vent management  Consults:  Heart Failure PCCM  Procedures:  ETT 09-27-20  Significant Diagnostic Tests:  CXR Sep 27, 2020 - Similar/slight worsening RML infiltrate  Micro Data:  BCx 12/24 NTGT  Antimicrobials:  Cefepime 27-Sep-2020  Interim History / Subjective:  As above  Objective   Blood pressure 129/83, pulse 83, temperature 99.4 F (37.4 C), temperature source Axillary, resp. rate (!) 22, height 5' (1.524 m), weight 70 kg, SpO2 98 %. CVP:  [0 mmHg-18 mmHg] 8 mmHg      Intake/Output Summary (Last 24 hours) at 09/27/20 0048 Last data filed  at 08/28/2020 2300 Gross per 24 hour  Intake 3656.45 ml  Output 2080 ml  Net 1576.45 ml   Filed Weights   08/27/20 0400 08/27/20 1538 08/28/20 0409  Weight: 69 kg 70.6 kg 70 kg   Physical Exam: General: Critically ill-appearing elderly female, obtunded, in respiratory distress HENT: [place], AT, OP moist, NRB Respiratory: Diminished breath sounds bilaterally.  No crackles, wheezing or rales Cardiovascular: LVAD hum, no JVD GI: BS+, soft, nontender Extremities:-Edema,-tenderness Neuro: AAO x4, CNII-XII grossly intact  Resolved Hospital Problem list     Assessment & Plan:   Acute hypoxemic respiratory failure secondary to aspiration pneumonitis/pneumonia, pulm edema --Intubate --Start mechanical ventilation. Obtain ABG in 1 hour --CXR --Diuresis per Heart Failure. Currently holding  Cardiogenic shock. May be developing septic shock in setting of aspiration NICM s/p ICD and LVAD --LVAD management and diuresis per Heart Failure team --Titrate levophed for MAP goal >65 --Start antibiotics for possible aspiration pneumonia  Atrial fib --Amiodarone --Holding anticoagulation due to elevated INR  Acute toxic metabolic encephalopathy in setting of: Hyperammonemia, hypernatremia, hypercalcemia --Lactulose --Correct electrolyte abnormalities as noted below  Hypernatremia --Free water  Hx MGUS >>Multiple myeloma - overall poor prognosis per Oncology AKI with severe hypercalcemia secondary to multiple myeloma --S/p calcitonin and zoledronic acid on 12/24  --Decadron per Oncology --Not a candidate for dialysis per Nephrology  Elevated INR s/p Vitamin K --Trend INR, CMP --Restart anticoagulation per primary team  Primary team and Palliative to continue goals of care  Best practice (evaluated daily)  Diet: NPO Pain/Anxiety/Delirium protocol (if indicated): Yes VAP protocol (if indicated): Yes DVT prophylaxis: Hold due to elevated INR on admission GI prophylaxis:  CBG  q4h Glucose control:  Mobility: BR Disposition: ICU  Goals of Care:  Last date of multidisciplinary goals of care discussion: N/A Family and staff present: N/A Summary of discussion: Primary team ([name]) spoke with [name] on 12/27 - Plan for Palliative Care consult Follow up goals of care discussion due: Yes Code Status: DNR, intubation ok. Confirmed by overnight Cardiologist, Dr. Blossom Hoops  Labs   CBC: Recent Labs  Lab 08/14/2020 1300 08/26/20 0035 08/27/20 0435 08/28/20 0233 08/28/20 0625  WBC 10.5 9.4 7.0 9.7  --   NEUTROABS 6.0  --   --   --   --   HGB 10.7* 9.5* 9.4* 9.2* 9.9*  HCT 35.9* 31.2* 29.9* 30.7* 29.0*  MCV 103.2* 101.3* 99.7 100.7*  --   PLT 135* 122* 118* 120*  --     Basic Metabolic Panel: Recent Labs  Lab 08/26/20 0035 08/26/20 2138 08/27/20 0435 08/28/20 0233 08/28/20 0625 08/28/20 2148  NA 143 143 144 147* 150* 142  K 4.3 4.0 3.0* 3.2* 3.6 2.9*  CL 108 110 109 109  --  105  CO2 23 21* 22 23  --  20*  GLUCOSE 143* 154* 123* 150*  --  143*  BUN 22 28* 28* 45*  --  45*  CREATININE 2.09* 1.87* 2.08* 2.51*  --  2.05*  CALCIUM 14.4* 11.2* 11.0* 10.8*  --  9.8  MG  --   --   --   --   --  1.9   GFR: Estimated Creatinine Clearance: 18.2 mL/min (A) (by C-G formula based on SCr of 2.05 mg/dL (H)). Recent Labs  Lab 08/23/2020 1300 08/26/20 0035 08/27/20 0435 08/28/20 0233 08/28/20 0620 08/28/20 0808 08/28/20 2113  WBC 10.5 9.4 7.0 9.7  --   --   --   LATICACIDVEN 2.0*  --   --   --  2.0* 2.2* 1.6    Liver Function Tests: Recent Labs  Lab 08/30/2020 1245 08/26/20 0035 08/27/20 0435 08/28/20 0233 08/28/20 2148  AST 36 _0 44*  ALT _1 ALKPHOS 105 94 85 79 76  BILITOT 1.1 1.2 1.1 1.1 1.0  PROT >12.0* 11.5* 10.9* 11.9* 10.8*  ALBUMIN 2.8* 2.5* 2.4* 2.5* 2.6*   No results for input(s): LIPASE, AMYLASE in the last 168 hours. Recent Labs  Lab 08/28/20 0639  AMMONIA 63*    ABG    Component Value Date/Time   PHART  7.494 (H) 08/28/2020 0625   PCO2ART 28.7 (L) 08/28/2020 0625   PO2ART 67 (L) 08/28/2020 0625   HCO3 22.1 08/28/2020 0625   TCO2 23 08/28/2020 0625   ACIDBASEDEF 1.0 08/28/2020 0625   O2SAT 50.2 08/28/2020 1948     Coagulation Profile: Recent Labs  Lab 08/24/2020 1300 08/26/20 0035 08/27/20 0435 08/28/20 0233 08/28/20 1414  INR 3.1* 3.4* 4.5* 7.4* 2.3*    Cardiac Enzymes: No results for input(s): CKTOTAL, CKMB, CKMBINDEX, TROPONINI in the last 168 hours.  HbA1C: Hgb A1c MFr Bld  Date/Time Value Ref Range Status  03/04/2018 09:33 AM 5.2 4.6 - 6.5 % Final    Comment:    Glycemic Control Guidelines for People with Diabetes:Non Diabetic:  <6%Goal of Therapy: <7%Additional Action Suggested:  >8%   04/05/2013 04:21 PM 5.7 (H) <5.7 % Final    Comment:    (NOTE)  According to the ADA Clinical Practice Recommendations for 2011, when HbA1c is used as a screening test:  >=6.5%   Diagnostic of Diabetes Mellitus           (if abnormal result is confirmed) 5.7-6.4%   Increased risk of developing Diabetes Mellitus References:Diagnosis and Classification of Diabetes Mellitus,Diabetes HLKT,6256,38(LHTDS 1):S62-S69 and Standards of Medical Care in         Diabetes - 2011,Diabetes KAJG,8115,72 (Suppl 1):S11-S61.    CBG: Recent Labs  Lab 08/13/2020 1223 08/26/20 2120 08/27/20 2333  GLUCAP 108* 144* 120*    Review of Systems:   Unable to obtain due to mental status  Past Medical History:  She,  has a past medical history of AICD (automatic cardioverter/defibrillator) present, Anemia, Atrial fibrillation or flutter, Cardiac arrest - ventricular fibrillation (12/10), CHF (congestive heart failure) ([place]), Diverticula of colon (2011), HTN (hypertension), ICD (implantable cardiac defibrillator) in place, Internal and external hemorrhoids without complication (6203), Nonischemic cardiomyopathy ([place]), Osteopenia, Plasma cell  disorder (03/20/2012), Plasma cell disorder (03/20/2012), and Sessile colonic polyp (2011).   Surgical History:   Past Surgical History:  Procedure Laterality Date  . ABDOMINAL HYSTERECTOMY    . CARDIAC CATHETERIZATION    . CARDIAC DEFIBRILLATOR PLACEMENT     by Greggory Brandy for secondary prevention of sudden death  . CARDIOVERSION N/A June 26, 2019   Procedure: CARDIOVERSION;  Surgeon: Jolaine Artist, MD;  Location: [facility] ENDOSCOPY;  Service: Cardiovascular;  Laterality: N/A;  . CHOLECYSTECTOMY N/A 10/09/2015   Procedure: LAPAROSCOPIC CHOLECYSTECTOMY;  Surgeon: Rolm Bookbinder, MD;  Location: [place];  Service: General;  Laterality: N/A;  . COLONOSCOPY W/ POLYPECTOMY  2011   Dr Benson [place]  . INSERTION OF IMPLANTABLE LEFT VENTRICULAR ASSIST DEVICE N/A 04/06/2013   Procedure: INSERTION OF IMPLANTABLE LEFT VENTRICULAR ASSIST DEVICE;  Surgeon: Gaye Pollack, MD;  Location: [place];  Service: Open Heart Surgery;  Laterality: N/A;  . INTRAOPERATIVE TRANSESOPHAGEAL ECHOCARDIOGRAM N/A 04/06/2013   Procedure: INTRAOPERATIVE TRANSESOPHAGEAL ECHOCARDIOGRAM;  Surgeon: Gaye Pollack, MD;  Location: [facility] OR;  Service: Open Heart Surgery;  Laterality: N/A;  . IR GENERIC HISTORICAL  09/24/2016   IR US GUIDE VASC ACCESS RIGHT 09/24/2016 Greggory Keen, MD [facility]INTER[facility]  . IR GENERIC HISTORICAL  09/24/2016   IR ANGIOGRAM VISCERAL SELECTIVE 09/24/2016 Greggory Keen, MD [facility]  . IR GENERIC HISTORICAL  09/24/2016   IR ANGIOGRAM VISCERAL SELECTIVE 09/24/2016 Greggory Keen, MD [facility]  . IR VERTEBROPLASTY EA ADDL (T&LS) BX INC UNI/BIL INC INJECT/IMAGING  12/29/2019  . IR VERTEBROPLASTY EA ADDL (T&LS) BX INC UNI/BIL INC INJECT/IMAGING  12/29/2019  . IR VERTEBROPLASTY EA ADDL (T&LS) BX INC UNI/BIL INC INJECT/IMAGING  12/29/2019  . IR VERTEBROPLASTY LUMBAR BX INC UNI/BIL INC/INJECT/IMAGING  12/29/2019  . PLACEMENT OF CENTRIMAG VENTRICULAR ASSIST DEVICE Right 04/06/2013   Procedure: PLACEMENT OF CENTRIMAG VENTRICULAR ASSIST DEVICE;   Surgeon: Gaye Pollack, MD;  Location: [place];  Service: Open Heart Surgery;  Laterality: Right;  . RADIOLOGY WITH ANESTHESIA N/A 12/29/2019   Procedure: KYPHOPLASTY;  Surgeon: Luanne Bras, MD;  Location: [place];  Service: Radiology;  Laterality: N/A;     Social History:   reports that she has never smoked. She has never used smokeless tobacco. She reports current alcohol use. She reports that she does not use drugs.   Family History:  Her family history includes Stroke in her brother and mother.   Allergies Allergies  Allergen Reactions  . Oxycodone Other (See Comments)    Hallucinations  Home Medications  Prior to Admission medications   Medication Sig Start Date End Date Taking? Authorizing Provider  amiodarone (PACERONE) 200 MG tablet TAKE 1 TABLET BY MOUTH EVERY DAY Patient taking differently: Take 200 mg by mouth daily. 03/29/20  Yes [name], Shaune Pascal, MD  calcitonin, salmon, (MIACALCIN/FORTICAL) 200 UNIT/ACT nasal spray Place 1 spray into alternate nostrils daily. 02/25/20  Yes [name], Shaune Pascal, MD  carvedilol (COREG) 6.25 MG tablet TAKE 1 TABLET BY MOUTH 2 TIMES DAILY WITH A MEAL Patient taking differently: Take 6.25 mg by mouth 2 (two) times daily with a meal. 07/10/20  Yes [name], Shaune Pascal, MD  Cholecalciferol (VITAMIN D3) 25 MCG (1000 UT) CAPS Take 1,000 Units by mouth daily.    Yes [provider]  docusate sodium (COLACE) 100 MG capsule Take 100 mg by mouth daily.   Yes [provider]  doxycycline (VIBRAMYCIN) 50 MG capsule Take 2 capsules (100 mg total) by mouth 2 (two) times daily. 08/22/20  Yes [name], Shaune Pascal, MD  ferrous sulfate 325 (65 FE) MG tablet Take 1 tablet (325 mg total) by mouth 2 (two) times daily with a meal. 07/11/15  Yes [name], Shaune Pascal, MD  furosemide (LASIX) 20 MG tablet Take 20 mg by mouth daily as needed for fluid or edema.   Yes [provider]  gabapentin (NEURONTIN) 100 MG capsule Take 3 capsules  (300 mg total) by mouth 2 (two) times daily. Patient taking differently: Take 100 mg by mouth in the morning and at bedtime. 10/25/19  Yes [name], Shaune Pascal, MD  hydrALAZINE (APRESOLINE) 100 MG tablet Take 0.5 tablets (50 mg total) by mouth 3 (three) times daily. 01/07/20  Yes Clegg, Amy D, NP  losartan (COZAAR) 50 MG tablet Take 1 tablet (50 mg total) by mouth daily. Patient taking differently: Take 25 mg by mouth daily. 04/13/20  Yes [name], Shaune Pascal, MD  magnesium oxide (MAG-OX) 400 (241.3 Mg) MG tablet Take 0.5 tablets (200 mg total) by mouth 2 (two) times daily. 02/17/20  Yes [name], Shaune Pascal, MD  pantoprazole (PROTONIX) 40 MG tablet TAKE 1 TABLET BY MOUTH EVERY DAY Patient taking differently: Take 40 mg by mouth daily as needed (for reflux). 06/26/20  Yes [name], Shaune Pascal, MD  potassium chloride SA (KLOR-CON M20) 20 MEQ tablet Take 1 tablet (20 mEq total) by mouth 2 (two) times daily. 08/16/19  Yes [name], Shaune Pascal, MD  traMADol (ULTRAM) 50 MG tablet Take 2 tablets (100 mg total) by mouth every 12 (twelve) hours as needed for moderate pain. 06/08/20  Yes [name], Shaune Pascal, MD  warfarin (COUMADIN) 2.5 MG tablet Take 5 mg (2 tabs) every Monday and 2.5 mg (1 tab) all other days or as directed by HF Clinic Patient taking differently: Take 2.5-5 mg by mouth See admin instructions. Take 1 tablet (2.5 mg totally) by mouth in the evening on Sun/Tues/Wed/Thurs/Fri/Sat and except 2 tablets ( 5 mg totally) by mouth on Mon 03/14/20  Yes Orma Render, RPH-CPP  ondansetron (ZOFRAN) 4 MG tablet Take 1 tablet (4 mg total) by mouth every 8 (eight) hours as needed for nausea or vomiting. 12/22/19   [name], Shaune Pascal, MD  promethazine (PHENERGAN) 12.5 MG tablet Take 1 tablet (12.5 mg total) by mouth every 6 (six) hours as needed for nausea or vomiting. 12/22/19   [name], Shaune Pascal, MD     Critical care time: 40 min    The patient is critically ill with multiple organ systems failure and requires  high complexity decision making for assessment and support, frequent evaluation and titration of therapies, application of advanced monitoring technologies and extensive interpretation of multiple databases.  Independent Critical Care Time: 40 Minutes.   Rodman Pickle, M.D. [facility] Pulmonary/Critical Care Medicine 21-Sep-2020 12:49 AM   Please see Amion for pager number to reach on-call Pulmonary and Critical Care Team.

## 2020-09-02 NOTE — Progress Notes (Signed)
This chaplain responded to PMT referral for spiritual care.  The Pt. appears to be resting comfortably. [name] and Scottie are bedside and attentive to the moments with the Pt.   The chaplain listened to the family describe the Pt. gift of communication and presence she shared freely with others.  There is a peaceful presence in the room filled with the family's hope.  F/U spiritual care was offered and accepted.

## 2020-09-02 NOTE — Progress Notes (Signed)
Pharmacy Antibiotic Note  Brianna Hanson is a 86 y.o. female admitted on 08/20/2020 with sepsis.  Pharmacy has been consulted for cefepime dosing.  Plan: Cefepime 2gm IV x 1 then 2gm IV q24 hours F/u renal function, cultures and clinical course  Height: 5' (152.4 cm) Weight: 70 kg (154 lb 5.2 oz) IBW/kg (Calculated) : 45.5  Temp (24hrs), Avg:98.6 F (37 C), Min:97.7 F (36.5 C), Max:99.4 F (37.4 C)  Recent Labs  Lab 08/12/2020 1300 08/26/20 0035 08/26/20 2138 08/27/20 0435 08/28/20 0233 08/28/20 0620 08/28/20 0808 08/28/20 2113 08/28/20 2148  WBC 10.5 9.4  --  7.0 9.7  --   --   --   --   CREATININE 1.99* 2.09* 1.87* 2.08* 2.51*  --   --   --  2.05*  LATICACIDVEN 2.0*  --   --   --   --  2.0* 2.2* 1.6  --     Estimated Creatinine Clearance: 18.2 mL/min (A) (by C-G formula based on SCr of 2.05 mg/dL (H)).    Allergies  Allergen Reactions  . Oxycodone Other (See Comments)    Hallucinations    Thank you for allowing pharmacy to be a part of this patient's care.  Talbert Cage [place] 09/18/20 1:16 AM

## 2020-09-02 DEATH — deceased

## 2020-09-05 ENCOUNTER — Ambulatory Visit: Payer: Medicare Other | Admitting: Sports Medicine

## 2020-09-05 ENCOUNTER — Telehealth (HOSPITAL_COMMUNITY): Payer: Self-pay | Admitting: Licensed Clinical Social Worker

## 2020-09-05 NOTE — Telephone Encounter (Signed)
CSW contacted patient's husband to offer condolences and support. [name] spoke at length about events leading up to her death. He shared that it feels "weird" not having her at home. CSW offered supportive intervention and available if further bereavement services needed. Lasandra Beech, LCSW, CCSW-MCS [phone]

## 2020-09-07 ENCOUNTER — Ambulatory Visit: Payer: Medicare Other

## 2020-09-14 ENCOUNTER — Encounter (HOSPITAL_COMMUNITY): Payer: Self-pay | Admitting: *Deleted

## 2020-09-14 NOTE — Progress Notes (Signed)
Pt's DC completed and signed by Dr Barbaraann Rondo [facility] aware and will p/u

## 2020-10-11 ENCOUNTER — Encounter (HOSPITAL_COMMUNITY): Payer: Medicare Other
